# Patient Record
Sex: Female | Born: 1945 | Race: Black or African American | Hispanic: No | Smoking: Former smoker
Health system: Southern US, Community
[De-identification: ages and names within clinical notes are randomized; demographics above are authoritative.]

## PROBLEM LIST (undated history)

## (undated) DIAGNOSIS — IMO0002 Reserved for concepts with insufficient information to code with codable children: Secondary | ICD-10-CM

## (undated) DIAGNOSIS — R011 Cardiac murmur, unspecified: Secondary | ICD-10-CM

## (undated) DIAGNOSIS — D649 Anemia, unspecified: Secondary | ICD-10-CM

## (undated) DIAGNOSIS — C50919 Malignant neoplasm of unspecified site of unspecified female breast: Secondary | ICD-10-CM

## (undated) DIAGNOSIS — IMO0001 Reserved for inherently not codable concepts without codable children: Secondary | ICD-10-CM

## (undated) DIAGNOSIS — M1990 Unspecified osteoarthritis, unspecified site: Secondary | ICD-10-CM

## (undated) DIAGNOSIS — K219 Gastro-esophageal reflux disease without esophagitis: Secondary | ICD-10-CM

## (undated) DIAGNOSIS — Z9221 Personal history of antineoplastic chemotherapy: Secondary | ICD-10-CM

## (undated) DIAGNOSIS — Z923 Personal history of irradiation: Secondary | ICD-10-CM

## (undated) DIAGNOSIS — T7840XA Allergy, unspecified, initial encounter: Secondary | ICD-10-CM

## (undated) DIAGNOSIS — M26609 Unspecified temporomandibular joint disorder, unspecified side: Secondary | ICD-10-CM

## (undated) DIAGNOSIS — G43909 Migraine, unspecified, not intractable, without status migrainosus: Secondary | ICD-10-CM

## (undated) DIAGNOSIS — Z9289 Personal history of other medical treatment: Secondary | ICD-10-CM

## (undated) DIAGNOSIS — I10 Essential (primary) hypertension: Secondary | ICD-10-CM

## (undated) HISTORY — DX: Malignant neoplasm of unspecified site of unspecified female breast: C50.919

## (undated) HISTORY — PX: BREAST BIOPSY: SHX20

## (undated) HISTORY — DX: Anemia, unspecified: D64.9

## (undated) HISTORY — PX: TONSILLECTOMY: SUR1361

## (undated) HISTORY — DX: Allergy, unspecified, initial encounter: T78.40XA

## (undated) HISTORY — DX: Essential (primary) hypertension: I10

## (undated) HISTORY — DX: Unspecified osteoarthritis, unspecified site: M19.90

## (undated) HISTORY — DX: Cardiac murmur, unspecified: R01.1

## (undated) HISTORY — DX: Reserved for concepts with insufficient information to code with codable children: IMO0002

## (undated) HISTORY — DX: Migraine, unspecified, not intractable, without status migrainosus: G43.909

## (undated) HISTORY — DX: Reserved for inherently not codable concepts without codable children: IMO0001

## (undated) HISTORY — PX: TOTAL ABDOMINAL HYSTERECTOMY: SHX209

---

## 1999-04-04 ENCOUNTER — Other Ambulatory Visit: Admission: RE | Admit: 1999-04-04 | Discharge: 1999-04-04 | Payer: Self-pay | Admitting: Obstetrics and Gynecology

## 2000-11-21 ENCOUNTER — Encounter: Payer: Self-pay | Admitting: Emergency Medicine

## 2000-11-21 ENCOUNTER — Observation Stay (HOSPITAL_COMMUNITY): Admission: EM | Admit: 2000-11-21 | Discharge: 2000-11-22 | Payer: Self-pay | Admitting: Emergency Medicine

## 2003-06-29 ENCOUNTER — Encounter: Admission: RE | Admit: 2003-06-29 | Discharge: 2003-06-29 | Payer: Self-pay | Admitting: Obstetrics and Gynecology

## 2003-06-29 ENCOUNTER — Encounter: Payer: Self-pay | Admitting: Obstetrics and Gynecology

## 2008-03-21 ENCOUNTER — Observation Stay (HOSPITAL_COMMUNITY): Admission: EM | Admit: 2008-03-21 | Discharge: 2008-03-23 | Payer: Self-pay | Admitting: Emergency Medicine

## 2008-04-09 ENCOUNTER — Ambulatory Visit: Payer: Self-pay | Admitting: Infectious Diseases

## 2008-04-09 ENCOUNTER — Encounter (INDEPENDENT_AMBULATORY_CARE_PROVIDER_SITE_OTHER): Payer: Self-pay | Admitting: Internal Medicine

## 2008-04-09 DIAGNOSIS — I10 Essential (primary) hypertension: Secondary | ICD-10-CM

## 2008-04-09 LAB — CONVERTED CEMR LAB
BUN: 13 mg/dL (ref 6–23)
CO2: 23 meq/L (ref 19–32)
Calcium: 9.2 mg/dL (ref 8.4–10.5)
Chloride: 100 meq/L (ref 96–112)
Creatinine, Ser: 0.77 mg/dL (ref 0.40–1.20)
Creatinine, Urine: 114.2 mg/dL
Glucose, Bld: 104 mg/dL — ABNORMAL HIGH (ref 70–99)
Microalb Creat Ratio: 13.5 mg/g (ref 0.0–30.0)
Microalb, Ur: 1.54 mg/dL (ref 0.00–1.89)
Potassium: 3.6 meq/L (ref 3.5–5.3)
Sodium: 140 meq/L (ref 135–145)

## 2008-07-03 ENCOUNTER — Encounter: Payer: Self-pay | Admitting: Infectious Disease

## 2008-08-27 ENCOUNTER — Telehealth (INDEPENDENT_AMBULATORY_CARE_PROVIDER_SITE_OTHER): Payer: Self-pay | Admitting: Internal Medicine

## 2008-10-23 ENCOUNTER — Encounter (INDEPENDENT_AMBULATORY_CARE_PROVIDER_SITE_OTHER): Payer: Self-pay | Admitting: Internal Medicine

## 2008-10-23 ENCOUNTER — Ambulatory Visit: Payer: Self-pay | Admitting: Internal Medicine

## 2008-11-12 ENCOUNTER — Ambulatory Visit (HOSPITAL_COMMUNITY): Admission: RE | Admit: 2008-11-12 | Discharge: 2008-11-12 | Payer: Self-pay | Admitting: Internal Medicine

## 2008-12-01 ENCOUNTER — Telehealth (INDEPENDENT_AMBULATORY_CARE_PROVIDER_SITE_OTHER): Payer: Self-pay | Admitting: Internal Medicine

## 2009-01-25 ENCOUNTER — Telehealth (INDEPENDENT_AMBULATORY_CARE_PROVIDER_SITE_OTHER): Payer: Self-pay | Admitting: Internal Medicine

## 2009-02-10 ENCOUNTER — Ambulatory Visit: Payer: Self-pay | Admitting: *Deleted

## 2009-02-10 ENCOUNTER — Encounter (INDEPENDENT_AMBULATORY_CARE_PROVIDER_SITE_OTHER): Payer: Self-pay | Admitting: Internal Medicine

## 2009-02-19 DIAGNOSIS — E876 Hypokalemia: Secondary | ICD-10-CM | POA: Insufficient documentation

## 2009-03-15 ENCOUNTER — Telehealth: Payer: Self-pay | Admitting: *Deleted

## 2009-03-17 ENCOUNTER — Encounter (INDEPENDENT_AMBULATORY_CARE_PROVIDER_SITE_OTHER): Payer: Self-pay | Admitting: Internal Medicine

## 2009-03-17 ENCOUNTER — Ambulatory Visit: Payer: Self-pay | Admitting: Internal Medicine

## 2009-03-22 LAB — CONVERTED CEMR LAB
BUN: 11 mg/dL (ref 6–23)
CO2: 24 meq/L (ref 19–32)
Calcium: 8.5 mg/dL (ref 8.4–10.5)
Chloride: 105 meq/L (ref 96–112)
Creatinine, Ser: 0.85 mg/dL (ref 0.40–1.20)
GFR calc Af Amer: >60 mL/min (ref >60)
GFR calc non Af Amer: >60 mL/min (ref >60)
Glucose, Bld: 93 mg/dL (ref 70–99)
Potassium: 3.4 meq/L — ABNORMAL LOW (ref 3.5–5.3)
Sodium: 141 meq/L (ref 135–145)

## 2009-03-23 LAB — CONVERTED CEMR LAB
BUN: 13 mg/dL (ref 6–23)
CO2: 29 meq/L (ref 19–32)
Calcium: 9.3 mg/dL (ref 8.4–10.5)
Chloride: 100 meq/L (ref 96–112)
Creatinine, Ser: 0.84 mg/dL (ref 0.40–1.20)
GFR calc Af Amer: >60 mL/min (ref >60)
GFR calc non Af Amer: >60 mL/min (ref >60)
Glucose, Bld: 95 mg/dL (ref 70–99)
Potassium: 3.3 meq/L — ABNORMAL LOW (ref 3.5–5.3)
Sodium: 140 meq/L (ref 135–145)

## 2009-04-08 ENCOUNTER — Ambulatory Visit: Payer: Self-pay | Admitting: Internal Medicine

## 2009-04-08 ENCOUNTER — Encounter (INDEPENDENT_AMBULATORY_CARE_PROVIDER_SITE_OTHER): Payer: Self-pay | Admitting: Internal Medicine

## 2009-04-08 LAB — CONVERTED CEMR LAB
Potassium: 3.8 meq/L (ref 3.5–5.3)
Sodium: 140 meq/L (ref 135–145)

## 2009-09-27 ENCOUNTER — Telehealth: Payer: Self-pay | Admitting: *Deleted

## 2009-10-20 ENCOUNTER — Encounter (INDEPENDENT_AMBULATORY_CARE_PROVIDER_SITE_OTHER): Payer: Self-pay | Admitting: *Deleted

## 2009-10-20 ENCOUNTER — Ambulatory Visit: Payer: Self-pay | Admitting: Internal Medicine

## 2009-10-20 LAB — CONVERTED CEMR LAB
Albumin: 4.2 g/dL (ref 3.5–5.2)
CO2: 24 meq/L (ref 19–32)
Chloride: 102 meq/L (ref 96–112)
Glucose, Bld: 93 mg/dL (ref 70–99)
Potassium: 3.6 meq/L (ref 3.5–5.3)
Sodium: 136 meq/L (ref 135–145)
Total Protein: 7.8 g/dL (ref 6.0–8.3)

## 2009-10-22 ENCOUNTER — Telehealth (INDEPENDENT_AMBULATORY_CARE_PROVIDER_SITE_OTHER): Payer: Self-pay | Admitting: Internal Medicine

## 2010-06-28 ENCOUNTER — Ambulatory Visit: Payer: Self-pay | Admitting: Internal Medicine

## 2010-06-28 DIAGNOSIS — R21 Rash and other nonspecific skin eruption: Secondary | ICD-10-CM

## 2010-06-29 ENCOUNTER — Encounter: Payer: Self-pay | Admitting: Internal Medicine

## 2010-06-29 ENCOUNTER — Ambulatory Visit: Payer: Self-pay | Admitting: Internal Medicine

## 2010-06-30 LAB — CONVERTED CEMR LAB
AST: 17 units/L (ref 0–37)
Albumin: 4 g/dL (ref 3.5–5.2)
Alkaline Phosphatase: 74 units/L (ref 39–117)
BUN: 19 mg/dL (ref 6–23)
Cholesterol: 205 mg/dL — ABNORMAL HIGH (ref 0–200)
HCT: 29.6 % — ABNORMAL LOW (ref 36.0–46.0)
HDL: 54 mg/dL (ref >39)
Platelets: 320 10*3/uL (ref 150–400)
Potassium: 3.8 meq/L (ref 3.5–5.3)
Sodium: 140 meq/L (ref 135–145)
TSH: 3.566 microintl units/mL (ref 0.350–4.5)
Total CHOL/HDL Ratio: 3.8
Total Protein: 7 g/dL (ref 6.0–8.3)
VLDL: 38 mg/dL (ref 0–40)

## 2010-10-25 NOTE — Letter (Signed)
Summary: Previsit letter  [facility]  [address] [place], [place] [address]   Phone: [phone]  Fax: [phone]       10/20/2009 MRN: [id number]  [facility] [address] [place], [place]  [address]  Dear Ms. Bursch,  Welcome to the Gastroenterology Division at [facility].    You are scheduled to see a nurse for your pre-procedure visit on 11/29/2009 at 9:00AM on the 3rd floor at [facility], [address]  We ask that you try to arrive at our office 15 minutes prior to your appointment time to allow for check-in.  Your nurse visit will consist of discussing your medical and surgical history, your immediate family medical history, and your medications.    Please bring a complete list of all your medications or, if you prefer, bring the medication bottles and we will list them.  We will need to be aware of both prescribed and over the counter drugs.  We will need to know exact dosage information as well.  If you are on blood thinners (Coumadin, Plavix, Aggrenox, Ticlid, etc.) please call our office today/prior to your appointment, as we need to consult with your physician about holding your medication.   Please be prepared to read and sign documents such as consent forms, a financial agreement, and acknowledgement forms.  If necessary, and with your consent, a friend or relative is welcome to sit-in on the nurse visit with you.  Please bring your insurance card so that we may make a copy of it.  If your insurance requires a referral to see a specialist, please bring your referral form from your primary care physician.  No co-pay is required for this nurse visit.     If you cannot keep your appointment, please call [phone] to cancel or reschedule prior to your appointment date.  This allows [place] the opportunity to schedule an appointment for another patient in need of care.    Thank you for choosing [facility] for your medical needs.  We  appreciate the opportunity to care for you.  Please visit [place] at our website  to learn more about our practice.                     Sincerely.                                                                                                                   The Gastroenterology Division

## 2010-10-25 NOTE — Progress Notes (Signed)
Summary: phone/gg  Phone Note Call from Patient   Summary of Call: Pt called and stated she received a flu shot yesterday and wants it added to her record. Initial call taken by: Merrie Roof RN,  October 22, 2009 2:57 PM  Follow-up for Phone Call        I do not know how to do this.  When I looked, though, it looked like it was already done.  Thanks. Follow-up by: Joaquin Courts  MD,  October 25, 2009 11:58 AM

## 2010-10-25 NOTE — Assessment & Plan Note (Signed)
Summary: per dr Andrey Campanile, checkup for med refills/pcp-wilson/hla   Vital Signs:  Patient profile:   65 year old female Height:      63 inches (160.02 cm) Weight:      145.6 pounds (66.18 kg) BMI:     25.89 Temp:     96.8 degrees F (36 degrees C) oral Pulse rate:   68 / minute BP sitting:   140 / 85  (right arm)  Vitals Entered By: Stanton Kidney Ditzler RN (October 20, 2009 2:06 PM) Is Patient Diabetic? No Pain Assessment Patient in pain? no      Nutritional Status BMI of 25 - 29 = overweight Nutritional Status Detail appetite good  Have you ever been in a relationship where you felt threatened, hurt or afraid?denies   Does patient need assistance? Functional Status Self care Ambulation Normal Comments Ck-up and refills on meds.   Primary Care Provider:  Joaquin Courts  MD   History of Present Illness: 65 yo woman who presents for fu because she needs a refill of her meds. She is having no health complaints.  Was noted to be slightly hypokalemic in the past for which she was given a short course of K+ supplementation, but she is done with this.   See ROS and physical exam, but absolutely no complaints today.   Depression History:      The patient denies a depressed mood most of the day and a diminished interest in her usual daily activities.         Preventive Screening-Counseling & Management  Alcohol-Tobacco     Smoking Status: never  Problems Prior to Update: 1)  Hypokalemia  (ICD-276.8) 2)  Essential Hypertension, Benign  (ICD-401.1) 3)  Screening For Unspecified Malignant Neoplasm  (ICD-V76.9)  Current Medications (verified): 1)  Maxzide-25 37.5-25 Mg Tabs (Triamterene-Hctz) .... Take 1 Tablet By Mouth Once A Day 2)  Premarin 0.45 Mg  Tabs (Estrogens Conjugated) 3)  Calcium 600/vitamin D 600-400 Mg-Unit Tabs (Calcium Carbonate-Vitamin D) .... Take 1 Tablet By Mouth Two Times A Day  Allergies: 1)  ! * Pencillin  Review of Systems  The patient denies anorexia,  fever, weight loss, weight gain, vision loss, decreased hearing, hoarseness, chest pain, syncope, dyspnea on exertion, peripheral edema, prolonged cough, headaches, hemoptysis, abdominal pain, melena, hematochezia, severe indigestion/heartburn, hematuria, incontinence, genital sores, muscle weakness, suspicious skin lesions, transient blindness, difficulty walking, depression, unusual weight change, abnormal bleeding, enlarged lymph nodes, angioedema, and breast masses.    Physical Exam  General:  alert, well-developed, well-nourished, and well-hydrated.   Head:  normocephalic and atraumatic.   Eyes:  vision grossly intact, pupils equal, pupils round, and pupils reactive to light.   Ears:  no external deformities.   Nose:  no external deformity.   Mouth:  no gingival abnormalities, pharynx pink and moist, no erythema, and no exudates.   Lungs:  normal respiratory effort, no accessory muscle use, normal breath sounds, no crackles, and no wheezes.   Heart:  normal rate, regular rhythm, no murmur, no gallop, and no rub.   Abdomen:  soft, non-tender, and normal bowel sounds.   Neurologic:  alert & oriented X3, cranial nerves II-XII intact, and strength normal in all extremities.   Psych:  Oriented X3, memory intact for recent and remote, normally interactive, good eye contact, not anxious appearing, and not depressed appearing.   Additional Exam:  manual BP in room 125/75.    Impression & Recommendations:  Problem # 1:  ESSENTIAL HYPERTENSION, BENIGN (ICD-401.1) Refiled  meds. Manual recheck in room was 125/75, which is at goal. Checking metabolic panel today.  Her updated medication list for this problem includes:    Maxzide-25 37.5-25 Mg Tabs (Triamterene-hctz) .Marland Kitchen... Take 1 tablet by mouth once a day  Orders: T-Comprehensive Metabolic Panel ([id number])  Problem # 2:  HYPOKALEMIA (ICD-276.8) Checking K+ today.  **K+ on the lower side of normal. Should be checked again at her next visit.    Orders: T-Comprehensive Metabolic Panel [id number])  Problem # 3:  Preventive Health Care (ICD-V70.0) Says tetanus 7 years ago. Agreed to flu but then recanted as she was unsure if her insurance would cover it. She will check and will get it at a local walk-in clinic near her house if it is covered. Up to date on mammo. Says she had normal PAP last month. Agreed to let me refer for mammo today.  Complete Medication List: 1)  Maxzide-25 37.5-25 Mg Tabs (Triamterene-hctz) .... Take 1 tablet by mouth once a day 2)  Premarin 0.45 Mg Tabs (Estrogens conjugated) 3)  Calcium 600/vitamin D 600-400 Mg-unit Tabs (Calcium carbonate-vitamin d) .... Take 1 tablet by mouth two times a day  Other Orders: Gastroenterology Referral (GI)  Patient Instructions: 1)  Please schedule a followup appointment in 6 months.  Prescriptions: MAXZIDE-25 37.5-25 MG TABS (TRIAMTERENE-HCTZ) Take 1 tablet by mouth once a day  #30 x 6   Entered and Authorized by:   Aris Lot MD   Signed by:   Aris Lot MD on 10/20/2009   Method used:   Print then Give to Patient   RxID:   [id number]  Process Orders Check Orders Results:     [facility]: ABN not required for this insurance Tests Sent for requisitioning (October 21, 2009 6:29 AM):     10/20/2009: [facility] -- T-Comprehensive Metabolic Panel [phone] (signed)    Prevention & Chronic Care Immunizations   Influenza vaccine: Fluvax Non-MCR  (10/23/2008)    Tetanus booster: Not documented    Pneumococcal vaccine: Not documented    H. zoster vaccine: Not documented  Colorectal Screening   Hemoccult: Not documented    Colonoscopy: Not documented   Colonoscopy action/deferral: GI referral  (10/20/2009)  Other Screening   Pap smear: Not documented    Mammogram: ASSESSMENT: Negative - BI-RADS 1^MM DIGITAL SCREENING  (11/12/2008)    DXA bone density scan: Not documented   Smoking status: never   (10/20/2009)  Lipids   Total Cholesterol: Not documented   LDL: Not documented   LDL Direct: Not documented   HDL: Not documented   Triglycerides: Not documented  Hypertension   Last Blood Pressure: 140 / 85  (10/20/2009)   Serum creatinine: 0.99  (04/08/2009)   Serum potassium 3.8  (04/08/2009) CMP ordered   Self-Management Support :    Patient will work on the following items until the next clinic visit to reach self-care goals:     Medications and monitoring: take my medicines every day, bring all of my medications to every visit, weigh myself weekly  (10/20/2009)     Eating: drink diet soda or water instead of juice or soda, eat more vegetables, use fresh or frozen vegetables, eat foods that are low in salt, eat baked foods instead of fried foods, eat fruit for snacks and desserts, limit or avoid alcohol  (10/20/2009)     Activity: take a 30 minute walk every day, take the stairs instead of the elevator  (10/20/2009)    Hypertension self-management support: Not  documented   Nursing Instructions:  GI referral for screening colonoscopy (see order)

## 2010-10-25 NOTE — Assessment & Plan Note (Signed)
Summary: ACUTE-RASH ON LEGS/MEDICATION REFILLS(HO)/CFB   Vital Signs:  Patient profile:   65 year old female Height:      63 inches (160.02 cm) Weight:      145.4 pounds (66.09 kg) BMI:     25.85 Temp:     96.5 degrees F (35.83 degrees C) oral Pulse rate:   72 / minute BP sitting:   114 / 69  (right arm)  Vitals Entered By: Stanton Kidney Ditzler RN (June 28, 2010 10:36 AM) Is Patient Diabetic? No Pain Assessment Patient in pain? yes     Location: 2nd finger right hand Intensity: 3 Type: dull Onset of pain  some time Nutritional Status BMI of 25 - 29 = overweight Nutritional Status Detail appetite good  Have you ever been in a relationship where you felt threatened, hurt or afraid?denies   Does patient need assistance? Functional Status Self care Ambulation Normal Comments Refills on meds and ck rash left leg x 2 weeks - itching better. Discuss 2nd finger right hand.   Primary Care Rashi Giuliani:  Joaquin Courts  MD   History of Present Illness: 65 yr old woman with pmhx as described below comes to the clinic for follow up. Reports that about 2 weeks ago she started to get rash in her left ankle and it has spread up. Was itchy but has resolved. Has use hydrocortisone cream, and alcohol. Reports to be getting better.  Denies fever or chills.   Patient would like to have refill of medication.   Depression History:      The patient denies a depressed mood most of the day and a diminished interest in her usual daily activities.         Preventive Screening-Counseling & Management  Alcohol-Tobacco     Smoking Status: never  Problems Prior to Update: 1)  Hypokalemia  (ICD-276.8) 2)  Essential Hypertension, Benign  (ICD-401.1) 3)  Screening For Unspecified Malignant Neoplasm  (ICD-V76.9)  Medications Prior to Update: 1)  Maxzide-25 37.5-25 Mg Tabs (Triamterene-Hctz) .... Take 1 Tablet By Mouth Once A Day 2)  Premarin 0.45 Mg  Tabs (Estrogens Conjugated) 3)  Calcium 600/vitamin  D 600-400 Mg-Unit Tabs (Calcium Carbonate-Vitamin D) .... Take 1 Tablet By Mouth Two Times A Day  Current Medications (verified): 1)  Maxzide-25 37.5-25 Mg Tabs (Triamterene-Hctz) .... Take 1 Tablet By Mouth Once A Day 2)  Premarin 0.45 Mg  Tabs (Estrogens Conjugated) 3)  Calcium 600/vitamin D 600-400 Mg-Unit Tabs (Calcium Carbonate-Vitamin D) .... Take 1 Tablet By Mouth Two Times A Day  Allergies: 1)  ! * Pencillin  Past History:  Past Medical History: Last updated: 04/09/2008 Htn Seasonal allegies Hx of heart murmur Hx of anemia  Past Surgical History: Last updated: 04/09/2008 Hysterectomy  Family History: Last updated: 04/09/2008 Mother-cervical ca brother-colon ca, CHF, DM  Social History: Last updated: 04/09/2008 No smoking, etoh once in a while, no illegal drugs.  Retired, worked for [employer] in [place] in 07/08.  Currently working as a [employer] fast streamer.   Risk Factors: Smoking Status: never (06/28/2010)  Family History: Reviewed history from 04/09/2008 and no changes required. Mother-cervical ca brother-colon ca, CHF, DM  Social History: Reviewed history from 04/09/2008 and no changes required. No smoking, etoh once in a while, no illegal drugs.  Retired, worked for [employer] in [employer] in 07/08.  Currently working as a [employer] fast streamer.   Review of Systems  The patient denies fever, chest pain, dyspnea on exertion,  peripheral edema, hemoptysis, abdominal pain, melena, hematochezia, and hematuria.    Physical Exam  General:  NAD Mouth:  MMM Neck:  supple.   Lungs:  normal respiratory effort, no accessory muscle use, normal breath sounds, no crackles, and no wheezes.   Heart:  normal rate, regular rhythm, no murmur, no gallop, and no rub.   Abdomen:  soft, non-tender, and normal bowel sounds.   Msk:  normal ROM.   Extremities:  No edema Neurologic:  Nonfocal Skin:  scaly rash on anterior aspect of left  ankle   Impression & Recommendations:  Problem # 1:  SKIN RASH (ICD-782.1) Most likely Psoriasis. Will start patient on treatment and follow up.  Her updated medication list for this problem includes:    Triamcinolone Acetonide 0.1 % Crea (Triamcinolone acetonide) .Marland Kitchen... Apply thin fild to affected area 2-3 times/day  Problem # 2:  ESSENTIAL HYPERTENSION, BENIGN (ICD-401.1) Controlled. Continue current regimen. Will review labs.  Her updated medication list for this problem includes:    Maxzide-25 37.5-25 Mg Tabs (Triamterene-hctz) .Marland Kitchen... Take 1 tablet by mouth once a day  Future Orders: T-CMP with Estimated GFR ([id number]) ... 06/29/2010 T-CBC No Diff ([id number]) ... 06/29/2010 T-Lipid Profile [id number]) ... 06/29/2010 T-TSH ([id number]) ... 06/29/2010  BP today: 114/69 Prior BP: 140/85 (10/20/2009)  Labs Reviewed: K+: 3.6 (10/20/2009) Creat: : 1.14 (10/20/2009)     Problem # 3:  Preventive Health Care (ICD-V70.0) Patient has not scheduled Colonoscopy because she is scared about the results. Went over the benefits of early screening of colon cancer. Patient was receptive and said she was going to schedule appointment for Colonoscopy.  Complete Medication List: 1)  Maxzide-25 37.5-25 Mg Tabs (Triamterene-hctz) .... Take 1 tablet by mouth once a day 2)  Premarin 0.45 Mg Tabs (Estrogens conjugated) 3)  Calcium 600/vitamin D 600-400 Mg-unit Tabs (Calcium carbonate-vitamin d) .... Take 1 tablet by mouth two times a day 4)  Triamcinolone Acetonide 0.1 % Crea (Triamcinolone acetonide) .... Apply thin fild to affected area 2-3 times/day  Other Orders: Influenza Vaccine NON MCR ([id number])  Patient Instructions: 1)  Please schedule a follow-up appointment in 6 months. 2)  Schedule Colonoscopy. 3)  Return tommorrow for blood draw. 4)  You will be called with any abnormalities in the tests scheduled or performed today.  If you don't hear from [place] within a week from when the test  was performed, you can assume that your test was normal.  Prescriptions: MAXZIDE-25 37.5-25 MG TABS (TRIAMTERENE-HCTZ) Take 1 tablet by mouth once a day  #30 x 6   Entered and Authorized by:   Laren Everts MD   Signed by:   Laren Everts MD on 06/28/2010   Method used:   [facility] to        [facility]  [address] [phone]* (retail)       [address]       [place], [place]  [address]       Ph: [phone] or [phone]       Fax: [phone]   RxID:   [id number] TRIAMCINOLONE ACETONIDE 0.1 % CREA (TRIAMCINOLONE ACETONIDE) Apply thin fild to affected area 2-3 times/day  #15g x 2   Entered and Authorized by:   Laren Everts MD   Signed by:   Laren Everts MD on 06/28/2010   Method used:   [facility]        [facility]  [address] [phone]* (retail)  [address]       [place], [place]  [address]       Ph: [phone] or [phone]       Fax: [phone]   RxID:   [id number]  Process Orders Tests Sent for requisitioning (June 28, 2010 1:17 PM):     06/29/2010: [facility] -- T-CMP with Estimated GFR [[id number]] (signed)     06/29/2010: [facility] -- T-CBC No Diff [[id number]] (signed)     06/29/2010: [facility] -- T-Lipid Profile [id number] (signed)     06/29/2010: [facility] -- T-TSH [phone] (signed)    Prevention & Chronic Care Immunizations   Influenza vaccine: Fluvax Non-MCR  (06/28/2010)    Tetanus booster: Not documented    Pneumococcal vaccine: Not documented    H. zoster vaccine: Not documented  Colorectal Screening   Hemoccult: Not documented    Colonoscopy: Not documented   Colonoscopy action/deferral: GI referral  (10/20/2009)  Other Screening   Pap smear: Not documented    Mammogram: ASSESSMENT: Negative - BI-RADS 1^MM DIGITAL SCREENING  (11/12/2008)    DXA  bone density scan: Not documented   Smoking status: never  (06/28/2010)  Lipids   Total Cholesterol: Not documented   LDL: Not documented   LDL Direct: Not documented   HDL: Not documented   Triglycerides: Not documented  Hypertension   Last Blood Pressure: 114 / 69  (06/28/2010)   Serum creatinine: 1.14  (10/20/2009)   Serum potassium 3.6  (10/20/2009)    Hypertension flowsheet reviewed?: Yes   Progress toward BP goal: At goal  Self-Management Support :   Personal Goals (by the next clinic visit) :      Personal blood pressure goal: 140/90  (06/28/2010)   Patient will work on the following items until the next clinic visit to reach self-care goals:     Medications and monitoring: take my medicines every day, check my blood pressure, bring all of my medications to every visit, examine my feet every day  (06/28/2010)     Eating: drink diet soda or water instead of juice or soda, eat more vegetables, use fresh or frozen vegetables, eat foods that are low in salt, eat fruit for snacks and desserts, limit or avoid alcohol  (06/28/2010)     Activity: take a 30 minute walk every day  (06/28/2010)    Hypertension self-management support: Written self-care plan, Education handout, Resources for patients handout  (06/28/2010)   Hypertension self-care plan printed.   Hypertension education handout printed      Resource handout printed.    Influenza Vaccine    Vaccine Type: Fluvax Non-MCR    Site: left deltoid    Mfr: GlaxoSmithKline    Dose: 0.5 ml    Route: IM    Given by: Stanton Kidney Ditzler RN    Exp. Date: 03/25/2011    Lot #: [id number]    VIS given: 04/19/10 version given June 28, 2010.  Flu Vaccine Consent Questions    Do you have a history of severe allergic reactions to this vaccine? no    Any prior history of allergic reactions to egg and/or gelatin? no    Do you have a sensitivity to the preservative Thimersol? no    Do you have a past history of Guillan-Barre  Syndrome? no    Do you currently have an acute febrile illness? no    Have you ever had a severe reaction  to latex? no    Vaccine information given and explained to patient? yes    Are you currently pregnant? no  Process Orders Tests Sent for requisitioning (June 28, 2010 1:17 PM):     06/29/2010: [facility] -- T-CMP with Estimated GFR [[id number]] (signed)     06/29/2010: [facility] -- T-CBC No Diff [[id number]] (signed)     06/29/2010: [facility] -- T-Lipid Profile [phone] (signed)     06/29/2010: [facility] -- T-TSH [phone] (signed)

## 2010-10-25 NOTE — Progress Notes (Signed)
Summary: Refill/gh  Phone Note Refill Request Message from:  Fax from Pharmacy on September 27, 2009 4:51 PM  Refills Requested: Medication #1:  MAXZIDE-25 37.5-25 MG TABS Take 1 tablet by mouth once a day   Last Refilled: 08/25/2009  Method Requested: Electronic Initial call taken by: Angelina Ok RN,  September 27, 2009 4:52 PM  Follow-up for Phone Call        I will refill for one month but please call her and let her know to make an appt. Follow-up by: Joaquin Courts  MD,  September 28, 2009 4:30 PM  Additional Follow-up for Phone Call Additional follow up Details #1::        appt 1/26 [place] Additional Follow-up by: Marin Roberts RN,  September 28, 2009 4:40 PM    Prescriptions: MAXZIDE-25 37.5-25 MG TABS (TRIAMTERENE-HCTZ) Take 1 tablet by mouth once a day  #30 x 0   Entered and Authorized by:   Joaquin Courts  MD   Signed by:   Joaquin Courts  MD on 09/28/2009   Method used:   [facility]        [facility] [address] [phone]* (retail)       [address]       [place], [place]  [id number]       Ph: [phone] or [phone]       Fax: [phone]   RxID:   [id number]   Appended Document: Refill/gh  [facility] to schdule pt with an appointment.  Angelina Ok, RN September 29, 2009 11:15 AM

## 2010-12-16 ENCOUNTER — Emergency Department (HOSPITAL_COMMUNITY)
Admission: EM | Admit: 2010-12-16 | Discharge: 2010-12-17 | Disposition: A | Discharge status: Home / Self Care | Payer: BC Managed Care – PPO | Attending: Emergency Medicine | Admitting: Emergency Medicine

## 2010-12-16 DIAGNOSIS — L259 Unspecified contact dermatitis, unspecified cause: Secondary | ICD-10-CM | POA: Insufficient documentation

## 2010-12-16 DIAGNOSIS — R21 Rash and other nonspecific skin eruption: Secondary | ICD-10-CM | POA: Insufficient documentation

## 2010-12-17 ENCOUNTER — Encounter: Payer: Self-pay | Admitting: Internal Medicine

## 2010-12-17 DIAGNOSIS — J302 Other seasonal allergic rhinitis: Secondary | ICD-10-CM | POA: Insufficient documentation

## 2010-12-17 DIAGNOSIS — R011 Cardiac murmur, unspecified: Secondary | ICD-10-CM | POA: Insufficient documentation

## 2010-12-17 DIAGNOSIS — D649 Anemia, unspecified: Secondary | ICD-10-CM | POA: Insufficient documentation

## 2011-01-03 ENCOUNTER — Other Ambulatory Visit: Payer: Self-pay | Admitting: Internal Medicine

## 2011-01-03 ENCOUNTER — Other Ambulatory Visit: Payer: Self-pay | Admitting: *Deleted

## 2011-01-03 NOTE — Telephone Encounter (Signed)
Please clarify script needed, thx

## 2011-01-04 MED ORDER — TRIAMTERENE-HCTZ 37.5-25 MG PO TABS: 1.0000 | ORAL_TABLET | Freq: Every day | ORAL | Status: DC

## 2011-01-31 ENCOUNTER — Other Ambulatory Visit: Payer: Self-pay | Admitting: Internal Medicine

## 2011-02-07 NOTE — Discharge Summary (Signed)
NAMEADELYNNE, JOERGER                 ACCOUNT NO.:  [id number]   MEDICAL RECORD NO.:  [id number]          PATIENT TYPE:  INP   LOCATION:  5120                         FACILITY:  [facility]   PHYSICIAN:  Mick Sell, MD DATE OF BIRTH:  Jul 13, 1946   DATE OF ADMISSION:  03/21/2008  DATE OF DISCHARGE:  03/23/2008                               DISCHARGE SUMMARY   DISCHARGE DIAGNOSES:  1. Contact dermatitis.  2. Hives  3. Hypertension.  4. Hypokalemia.  5. Hyperglycemia secondary to steroids.  6. Mild normocytic anemia.  7. PENICILLIN allergy.   DISCHARGE MEDICATIONS:  1. HCTZ 25 mg p.o. daily  2. Prednisone taper pack 60 mg tapering by 10 mg each day for 6 days.  3. Benadryl 25 mg p.o. q.6 h p.r.n.   DISPOSITION AND FOLLOW UP:  The patient has to follow up with Dr.  Joaquin Courts in the [facility] on April 09, 2008, at 2 p.m.Marland Kitchen  At that time, a BMET  needs to be drawn to evaluate  for hypokalemia.  The patient's blood pressure also needs to be checked  and the patient may benefit from the addition of another  antihypertensive agent.  She also may benefit from a urine/microalbumin  creatinine ratio to help assist in decision making regarding  antihypertensive medication.   PROCEDURES PERFORMED:  No procedures were performed.   CONSULTATIONS:  No consultations were obtained.   ADMISSION HISTORY AND PHYSICAL:  Patient is a 65 year old female with no  primary care physician presenting to the emergency room with a 3-day  history of his swollen left eye that started after she was working in  her garden.  She notes significant itching around the eye.  She  presented to the emergency room and was treated with Augmentin for  periorbital cellulitis.  After taking the Augmentin, she developed hives  on the inferior aspect of her bilateral face along with posterior neck.  She presented back to [facility] emergency room with worsening of her  left eyelid swelling  and erythema along with hives.  She denied any  shortness of breath, tongue swelling, or any other symptoms.   PHYSICAL EXAMINATION:  ADMISSION VITALS:  Temperature 97.5, blood  pressure 181/94, heart rate 75, respiratory rate 18, O2 sat 99% on room  air.  GENERAL:  On admission physical exam generally, she is alert, pleasant,  in no acute distress.  EYE EXAM:  Sclera were clear.  The left eye lid at top and bottom had  significant erythema and edema so much so that the eye was completely  closed.  Pupils are equally round and reactive to light, and extraocular  motions were intact.  The patient also had high on the inferior aspect  of her face in perioral area and posterior neck.  CARDIOVASCULAR EXAM:  Regular rate and rhythm with 2/6 systolic ejection  murmur, best heard at the right sternal border.  LUNGS:  Clear to auscultation bilaterally with no wheezes and good air  movement.   ADMISSION LABS:  Sodium 137, potassium 3.5, chloride 104, bicarb 23, BUN  8, creatinine 0.72, glucose 109, calcium 8.9.  CBC, white blood cell  count 7.3, hemoglobin 11.8, MCV 95.1, platelet count 300, absolute  eosinophil count 0.7 with a 10% eosinophilia on the differential.  TSH  3.436, hemoglobin A1c 5.5, free T4 0.95.  Urinalysis, moderate blood  with many squamous epithelial cella, 3-6 RBCs and rare bacteria.  Lipid  profile, total cholesterol 193 with triglycerides 61, HDL 68, and LDL  113.   DIAGNOSTIC IMAGING:  1. A CT scan of the orbit showed one left periorbital and facial      cellulitis.  2. No postseptal or intraorbital extension.  3. Minimal scattered mucosal thickening and paranasal sinuses with      small amount of fluid or mucous in right maxillary sinus.   HOSPITAL COURSE:  1. Contact dermatitis.  It was felt like her erythema and edema around      the patient's left eye was likely a contact dermatitis.  Given her      history of significant digging around in the weeds the day  prior to      this event and her eosinophilia on her labs, the patient was      treated with 125 mg of Solu-Medrol in the emergency room along with      a prednisone taper and markedly improved at the time of discharge.      It was initially unclear and perhaps she did in fact have a      periorbital cellulitis and was treated with Augmentin and has a      penicillin allergy, which created the hives, so antibiotics were      held and the patient markedly improved with steroids once this was      felt like this was likely an allergic reaction.  The patient      remained afebrile and her white count was within normal limits at      the time of admission but increased most likely because of the      steroids.  2. Hives.  It was felt like the hives were secondary to Augmentin and      the patient's physical exam markedly improved and at the time of      discharge, the hives were completely gone and the patient only had      minimal edema around her left eye and could open it without      difficulty.  3. Hypertension.  The patient's blood pressure was noted to be      elevated with systolics in the 180s on admission.  Since she was      not experiencing any pain and her blood pressure was so high, BMET      was done within normal potassium and creatinine and urinalysis was      done and was not indicative of any protein in her urine.  EKG was      also done indicative of normal sinus rhythm without LVH.  A      hemoglobin A1c was checked and a lipid panel and all are within      normal limits.  The patient was started on HCTZ and after 2 days      her potassium dropped to 3.3 and this was repleted orally.  The      patient will have this followed up in the outpatient setting and      likely need another agent added because her blood pressure was so  elevated.  At the time of discharge her blood pressures ranged with      systolics in the 130s-150s.  4. Hypokalemia.  The patient's  potassium dropped slightly after HCTZ      was initiated and was repleted orally and this will be followed up      in the outpatient setting.  5. Hyperglycemia.  The patient's blood sugar was normal on admission      and hemoglobin A1c was checked and 5.5.  The patient did have a      couple of glucoses noted to be around 200 and was felt like this      was likely secondary to steroid administration.  6. Mild normocytic anemia.  An anemia panel was checked and B12 folate      and ferritin were all normal with her ferritin actually being a      little elevated 406.  Her iron and TIBC and percent saturation were      also within normal limits.   DISCHARGE VITALS:  Temperature 97.9, blood pressure 155/86, heart rate  69, respiratory rate 20, O2 sat 98% on room air.   DISCHARGE LABS:  Sodium 138, potassium 3.3, chloride 98, bicarb 28, BUN  20, creatinine 0.99, glucose 137, calcium 9.8, white blood cell count  18.5, hemoglobin 11.5, MCV 95.4, and platelet count 312.      Joaquin Courts, MD  Electronically Signed      Mick Sell, MD  Electronically Signed    VW/MEDQ  D:  03/23/2008  T:  03/24/2008  Job:  [id number]

## 2011-02-14 ENCOUNTER — Encounter: Payer: BC Managed Care – PPO | Admitting: Internal Medicine

## 2011-02-14 ENCOUNTER — Other Ambulatory Visit: Payer: Self-pay | Admitting: Internal Medicine

## 2011-02-21 ENCOUNTER — Encounter: Payer: BC Managed Care – PPO | Admitting: Internal Medicine

## 2011-03-07 ENCOUNTER — Encounter: Payer: Self-pay | Admitting: Internal Medicine

## 2011-03-07 ENCOUNTER — Ambulatory Visit (INDEPENDENT_AMBULATORY_CARE_PROVIDER_SITE_OTHER): Payer: BC Managed Care – PPO | Admitting: Internal Medicine

## 2011-03-07 VITALS — BP 109/69 | HR 78 | Temp 97.3°F | Ht 63.0 in | Wt 146.0 lb

## 2011-03-07 DIAGNOSIS — D649 Anemia, unspecified: Secondary | ICD-10-CM

## 2011-03-07 DIAGNOSIS — E781 Pure hyperglyceridemia: Secondary | ICD-10-CM | POA: Insufficient documentation

## 2011-03-07 DIAGNOSIS — E785 Hyperlipidemia, unspecified: Secondary | ICD-10-CM | POA: Insufficient documentation

## 2011-03-07 DIAGNOSIS — Z780 Asymptomatic menopausal state: Secondary | ICD-10-CM

## 2011-03-07 DIAGNOSIS — I10 Essential (primary) hypertension: Secondary | ICD-10-CM

## 2011-03-07 LAB — CBC
HCT: 31.4 % — ABNORMAL LOW (ref 36.0–46.0)
MCV: 93.5 fL (ref 78.0–100.0)
Platelets: 327 10*3/uL (ref 150–400)
RBC: 3.36 MIL/uL — ABNORMAL LOW (ref 3.87–5.11)
RDW: 13.4 % (ref 11.5–15.5)
WBC: 7.1 10*3/uL (ref 4.0–10.5)

## 2011-03-07 LAB — IRON: Iron: 50 ug/dL (ref 42–145)

## 2011-03-07 LAB — VITAMIN B12: Vitamin B-12: 540 pg/mL (ref 211–911)

## 2011-03-07 MED ORDER — CALCIUM CARBONATE-VITAMIN D 600-400 MG-UNIT PO TABS: 1.0000 | ORAL_TABLET | Freq: Every day | ORAL | Status: DC

## 2011-03-07 MED ORDER — TRIAMTERENE-HCTZ 37.5-25 MG PO TABS: 1.0000 | ORAL_TABLET | Freq: Every day | ORAL | Status: DC

## 2011-03-07 NOTE — Assessment & Plan Note (Addendum)
Hb 9.8 (06/2010).  Unclear etiology.  This could be iron deficiency vs. Sickle cell trait.  Currently asymptomatic. -Will recheck CBC today -Check anemia panel: iron, ferritin, folate, B12 -Will replete with supplement as appropriate

## 2011-03-07 NOTE — Patient Instructions (Signed)
Will get labs today and I will call you with results -Take over the counter calcium-vit D to help with bone health -Will schedule for colonoscopy and mammogram -Need to follow up with GYN for pap smear Follow up with Dr. Anselm Jungling in 4-6 months

## 2011-03-07 NOTE — Progress Notes (Signed)
Tabitha Ramirez is a 65 yo woman with HTN, anemia presents for follow up and medication refills.  She states that she has been doing well and has no complaints at this time.  She denies any fatigue, SOB, chestpain, or any other systemic symptoms.  She states that she was told that she has sickle cell trait and that her Hb has always been low but does not know her baseline Hb.  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  Reports medication compliance.  She had mammogram about 1.5 years ago and it was negative.  Never had colonoscopy in the past.  Follow up with GYN for pap smear in which she has an upcoming appointment.  Patient reports being on Premarin for a long time and no one has told her to stop.  I advised her to follow up with GYN and discuss tapering of Premarin.  Physical examination: General: alert, well-developed, and cooperative to examination.  Head: normocephalic and atraumatic.  Eyes: vision grossly intact, pupils equal, pupils round, pupils reactive to light, no injection and anicteric.  Mouth: pharynx pink and moist, no erythema, and no exudates.  Lungs: normal respiratory effort, no accessory muscle use, normal breath sounds, no crackles, and no wheezes. Heart: normal rate, regular rhythm,2/6 systolic murmur, no gallop, and no rub.  Abdomen: soft, non-tender, normal bowel sounds, no distention, no guarding, no rebound tenderness, no hepatomegaly, and no splenomegaly.  Msk: no joint swelling, no joint warmth, and no redness over joints.  Pulses: 2+ DP/PT pulses bilaterally Extremities: No cyanosis, clubbing, edema Neurologic: alert & oriented X3, cranial nerves II-XII intact, strength normal in all extremities, sensation intact to light touch, and gait normal.  Skin: turgor normal and no rashes.  Psych: Oriented X3, memory intact for recent and remote, normally interactive, good eye contact, not anxious appearing, and not depressed appearing.  ROS: as per HPI

## 2011-03-07 NOTE — Assessment & Plan Note (Signed)
Excellent control!  BP 109/69 today.  Will continue Maxzide.  Advised on low salt diet and exercise at least 30 mins per day, 3x/wk.

## 2011-03-08 LAB — FOLATE RBC: RBC Folate: 896 ng/mL (ref >=366)

## 2011-06-22 LAB — RETICULOCYTES
RBC.: 3.76 — ABNORMAL LOW
Retic Count, Absolute: 37.6
Retic Ct Pct: 1

## 2011-06-22 LAB — URINALYSIS, ROUTINE W REFLEX MICROSCOPIC
Bilirubin Urine: NEGATIVE
Glucose, UA: NEGATIVE
Ketones, ur: NEGATIVE
Leukocytes, UA: NEGATIVE
pH: 6

## 2011-06-22 LAB — CBC
HCT: 34.4 — ABNORMAL LOW
HCT: 36
Hemoglobin: 11.8 — ABNORMAL LOW
MCHC: 34.4
MCV: 95.1
Platelets: 300
Platelets: 312
Platelets: 317
RBC: 3.61 — ABNORMAL LOW
RDW: 13.5
RDW: 13.8
WBC: 5.3
WBC: 7.3

## 2011-06-22 LAB — BASIC METABOLIC PANEL
BUN: 10
BUN: 20
BUN: 8
Calcium: 8.9
Calcium: 9.4
Creatinine, Ser: 0.72
Creatinine, Ser: 0.99
GFR calc non Af Amer: 57 — ABNORMAL LOW
GFR calc non Af Amer: >60
GFR calc non Af Amer: >60
Glucose, Bld: 109 — ABNORMAL HIGH
Glucose, Bld: 137 — ABNORMAL HIGH
Potassium: 3.5
Potassium: 3.5
Sodium: 134 — ABNORMAL LOW

## 2011-06-22 LAB — TSH: TSH: 3.436

## 2011-06-22 LAB — DIFFERENTIAL
Basophils Absolute: 0
Basophils Absolute: 0.1
Basophils Relative: 0
Basophils Relative: 1
Eosinophils Absolute: 0.7
Eosinophils Relative: 0
Eosinophils Relative: 10 — ABNORMAL HIGH
Lymphocytes Relative: 12
Lymphocytes Relative: 26
Lymphocytes Relative: 31
Lymphs Abs: 1.4
Lymphs Abs: 2.2
Monocytes Absolute: 0.3
Monocytes Relative: 4
Neutro Abs: 15.5 — ABNORMAL HIGH
Neutro Abs: 4
Neutrophils Relative %: 55
Neutrophils Relative %: 73

## 2011-06-22 LAB — LIPID PANEL
Cholesterol: 193
HDL: 68
LDL Cholesterol: 113 — ABNORMAL HIGH
Total CHOL/HDL Ratio: 2.8
Triglycerides: 61

## 2011-06-22 LAB — BASIC METABOLIC PANEL WITH GFR
CO2: 23
Chloride: 104
GFR calc Af Amer: >60
Sodium: 137

## 2011-06-22 LAB — URINE MICROSCOPIC-ADD ON

## 2011-06-22 LAB — FERRITIN: Ferritin: 406 — ABNORMAL HIGH (ref 10–291)

## 2011-06-22 LAB — HEMOGLOBIN A1C
Hgb A1c MFr Bld: 5.5
Mean Plasma Glucose: 119

## 2011-06-22 LAB — IRON AND TIBC
Iron: 97
Saturation Ratios: 29
TIBC: 333
UIBC: 236

## 2011-06-22 LAB — VITAMIN B12: Vitamin B-12: 695 (ref 211–911)

## 2011-06-22 LAB — T4, FREE: Free T4: 0.95

## 2011-06-22 LAB — FOLATE: Folate: 14.3

## 2011-11-29 ENCOUNTER — Ambulatory Visit (INDEPENDENT_AMBULATORY_CARE_PROVIDER_SITE_OTHER): Payer: BC Managed Care – PPO | Admitting: Internal Medicine

## 2011-11-29 ENCOUNTER — Encounter: Payer: Self-pay | Admitting: Internal Medicine

## 2011-11-29 VITALS — BP 103/66 | HR 75 | Temp 97.4°F | Ht 63.0 in | Wt 143.1 lb

## 2011-11-29 DIAGNOSIS — Z780 Asymptomatic menopausal state: Secondary | ICD-10-CM

## 2011-11-29 DIAGNOSIS — E781 Pure hyperglyceridemia: Secondary | ICD-10-CM

## 2011-11-29 DIAGNOSIS — N959 Unspecified menopausal and perimenopausal disorder: Secondary | ICD-10-CM

## 2011-11-29 DIAGNOSIS — D649 Anemia, unspecified: Secondary | ICD-10-CM

## 2011-11-29 DIAGNOSIS — I10 Essential (primary) hypertension: Secondary | ICD-10-CM

## 2011-11-29 DIAGNOSIS — Z23 Encounter for immunization: Secondary | ICD-10-CM | POA: Insufficient documentation

## 2011-11-29 DIAGNOSIS — E876 Hypokalemia: Secondary | ICD-10-CM

## 2011-11-29 MED ORDER — TRIAMTERENE-HCTZ 37.5-25 MG PO TABS: 1.0000 | ORAL_TABLET | Freq: Every day | ORAL | Status: DC

## 2011-11-29 NOTE — Assessment & Plan Note (Signed)
Likely anemia of chronic disease with a ferritin in 500's and iron 50's.  Her Hb has been stable in the 10-11 range.  She also reports having sickle cell traits.  Patient is  Completely asymptomatic. -Will monitor CBC

## 2011-11-29 NOTE — Progress Notes (Signed)
HPI: [name] is a 66 yo W with PMH of anemia of chronic disease, HTN, hypertriglyceride, systolic heart murmur presents today for routine follow up.  She has been doing well and does not have any specific complaints except for occasional arthritis pain.  She needs RF on her BP med today. Denies any chest pain, dyspnea or angina or syncope symptoms. 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 She had a pap smear last year and will follow up with GYN.  She states it was normal.  She discussed with her GYN about premarin and decided to continue taking this medication despite the possible side effect of cancer for long-term use.  She had colonoscopy last 04/2011 and it was normal, next one is due in 2022. She does not want Tdap today.  ROS: as per HPI  PE: General: alert, well-developed, and cooperative to examination.  Neck: supple, full ROM, no thyromegaly, no JVD Lungs: normal respiratory effort, no accessory muscle use, normal breath sounds, no crackles, and no wheezes. Heart: normal rate, regular rhythm, 2/6 systolic murmur right upper border with radiation to carotid, no gallop, and no rub.  Abdomen: soft, non-tender, normal bowel sounds, no distention, no guarding, no rebound tenderness Msk: no joint swelling, no joint warmth, and no redness over joints.  Pulses: 2+ DP/PT pulses bilaterally Extremities: No cyanosis, clubbing, edema Neurologic: alert & oriented X3, cranial nerves II-XII intact, strength normal in all extremities, sensation intact to light touch, and gait normal.  Skin: turgor normal and no rashes.  Psych: Oriented X3, memory intact for recent and remote, normally interactive, good eye contact, not anxious appearing, and not depressed appearing.

## 2011-11-29 NOTE — Assessment & Plan Note (Signed)
Administer influenza vaccine today

## 2011-11-29 NOTE — Patient Instructions (Signed)
Continue current medications Will get labs today and I will call you with any abnormal lab results Follow up with Dr. Anselm Jungling in 1 year or sooner if needed

## 2011-11-29 NOTE — Assessment & Plan Note (Signed)
Well controlled. WIll continue Maxzide 37.5/25mg  qd -Will check CMP to make sure her electrolytes are wnl

## 2011-11-29 NOTE — Assessment & Plan Note (Signed)
Patient discussed with GYN and decided to continue on Premarin despite possible side effect of cancer for long-term use.

## 2011-11-29 NOTE — Assessment & Plan Note (Signed)
LDL last year was 113 and triglyceride 180's, no other risk factors beside HTN.  Patient has been diet control -Will repeat lipid panel today

## 2011-11-30 LAB — LIPID PANEL
Cholesterol: 164 mg/dL (ref 0–200)
HDL: 43 mg/dL (ref >39)
Total CHOL/HDL Ratio: 3.8 Ratio

## 2011-11-30 LAB — COMPLETE METABOLIC PANEL WITH GFR
Albumin: 3.9 g/dL (ref 3.5–5.2)
Alkaline Phosphatase: 94 U/L (ref 39–117)
BUN: 19 mg/dL (ref 6–23)
CO2: 26 mEq/L (ref 19–32)
GFR, Est African American: 74 mL/min
GFR, Est Non African American: 64 mL/min
Glucose, Bld: 121 mg/dL — ABNORMAL HIGH (ref 70–99)
Potassium: 3.5 mEq/L (ref 3.5–5.3)
Total Bilirubin: 0.3 mg/dL (ref 0.3–1.2)

## 2011-11-30 LAB — CBC
HCT: 29.3 % — ABNORMAL LOW (ref 36.0–46.0)
MCHC: 32.4 g/dL (ref 30.0–36.0)
RDW: 13.5 % (ref 11.5–15.5)
WBC: 7.7 10*3/uL (ref 4.0–10.5)

## 2012-02-20 ENCOUNTER — Ambulatory Visit (INDEPENDENT_AMBULATORY_CARE_PROVIDER_SITE_OTHER): Payer: BC Managed Care – PPO | Admitting: Physician Assistant

## 2012-02-20 VITALS — BP 104/58 | HR 84 | Temp 98.5°F | Resp 18 | Ht 63.0 in | Wt 142.8 lb

## 2012-02-20 DIAGNOSIS — R7989 Other specified abnormal findings of blood chemistry: Secondary | ICD-10-CM

## 2012-02-20 DIAGNOSIS — L237 Allergic contact dermatitis due to plants, except food: Secondary | ICD-10-CM

## 2012-02-20 DIAGNOSIS — Z131 Encounter for screening for diabetes mellitus: Secondary | ICD-10-CM

## 2012-02-20 DIAGNOSIS — L255 Unspecified contact dermatitis due to plants, except food: Secondary | ICD-10-CM

## 2012-02-20 DIAGNOSIS — L299 Pruritus, unspecified: Secondary | ICD-10-CM

## 2012-02-20 LAB — GLUCOSE, POCT (MANUAL RESULT ENTRY): POC Glucose: 124 mg/dl — AB (ref 70–99)

## 2012-02-20 MED ORDER — PREDNISONE 10 MG PO TABS: ORAL_TABLET | ORAL | Status: DC

## 2012-02-20 MED ORDER — TRIAMCINOLONE ACETONIDE 0.1 % EX CREA: TOPICAL_CREAM | Freq: Two times a day (BID) | CUTANEOUS | Status: DC

## 2012-02-20 MED ORDER — CETIRIZINE HCL 10 MG PO TABS: 10.0000 mg | ORAL_TABLET | Freq: Every day | ORAL | Status: DC

## 2012-02-20 NOTE — Progress Notes (Signed)
  Subjective:    Patient ID: Tabitha Ramirez, female    DOB: Nov 23, 1945, 66 y.o.   MRN: [id number]  HPI 66 yr old AAF presents with pruritic rash s/p exposure to poison ivy ~4days ago.  No f/c, no recent illness. Rash on forearms and hands.  Review of Systems  All other systems reviewed and are negative.      Objective:   Physical Exam  Nursing note and vitals reviewed. Constitutional: She is oriented to person, place, and time. She appears well-developed and well-nourished.  HENT:  Head: Normocephalic and atraumatic.  Cardiovascular: Normal rate, regular rhythm and normal heart sounds.   Pulmonary/Chest: Effort normal and breath sounds normal.  Neurological: She is alert and oriented to person, place, and time.  Skin: Rash (contact distribution vesicles typical of poison ivy/poison oak. Present on forearms and hands.) noted.     Results for orders placed in visit on 02/20/12  GLUCOSE, POCT (MANUAL RESULT ENTRY)      Component Value Range   POC Glucose 124 (*) 70 - 99 (mg/dl)        Assessment & Plan:  Poison Ivy/oak Elevated non-fasting glucose.  I doubt she will need the steroids and we agreed that she would give it a few days before she took them if she does require them.  She also agrees to have her fasting glucose checked within the next couple of months.

## 2012-05-30 ENCOUNTER — Telehealth: Payer: Self-pay

## 2012-05-30 ENCOUNTER — Ambulatory Visit (INDEPENDENT_AMBULATORY_CARE_PROVIDER_SITE_OTHER): Payer: BC Managed Care – PPO | Admitting: Family Medicine

## 2012-05-30 ENCOUNTER — Ambulatory Visit: Payer: BC Managed Care – PPO

## 2012-05-30 VITALS — BP 100/50 | HR 90 | Temp 98.5°F | Resp 16 | Ht 63.0 in | Wt 139.0 lb

## 2012-05-30 DIAGNOSIS — R05 Cough: Secondary | ICD-10-CM

## 2012-05-30 DIAGNOSIS — R059 Cough, unspecified: Secondary | ICD-10-CM

## 2012-05-30 LAB — POCT CBC
Granulocyte percent: 53.6 %G (ref 37–80)
Lymph, poc: 2.9 (ref 0.6–3.4)
MID (cbc): 0.7 (ref 0–0.9)
MPV: 8.5 fL (ref 0–99.8)
POC Granulocyte: 4.1 (ref 2–6.9)
POC LYMPH PERCENT: 37.8 %L (ref 10–50)
Platelet Count, POC: 297 10*3/uL (ref 142–424)
RDW, POC: 14.9 %
WBC: 7.6 10*3/uL (ref 4.6–10.2)

## 2012-05-30 MED ORDER — AZITHROMYCIN 250 MG PO TABS: ORAL_TABLET | ORAL | Status: AC

## 2012-05-30 MED ORDER — HYDROCOD POLST-CHLORPHEN POLST 10-8 MG/5ML PO LQCR: 5.0000 mL | Freq: Two times a day (BID) | ORAL | Status: DC | PRN

## 2012-05-30 NOTE — Patient Instructions (Signed)
Get plenty of rest and drink at least 64 ounces of water each day.  If your cough persists, return for additional evaluation.

## 2012-05-30 NOTE — Telephone Encounter (Signed)
Pharm called. Tussionex is back ordered. Per Chelle, OK to change to [facility]

## 2012-05-30 NOTE — Progress Notes (Signed)
Subjective:    Patient ID: Tabitha Ramirez, female    DOB: 1945/12/29, 66 y.o.   MRN: [id number]  HPI This 66 y.o. female presents for evaluation of a cough present x at least a month. Non-productive.  Worse at night.  Thought it was allergies, but Benadryl has not helped.  Throat lozenges help temporarily. Today felt like throat was closing slightly.  No indigestion, dyspepsia, nausea. Slight sore throat.  No nasal/sinus congestion, post-nasal drainage.  No diarrhea.  No SOB.  Some chest pain today with coughing.    Review of Systems As above.   Past Medical History  Diagnosis Date  . Heart murmur   . Hypertension   . Anemia   . Allergy     Past Surgical History  Procedure Date  . Abdominal hysterectomy     Prior to Admission medications   Medication Sig Start Date End Date Taking? Authorizing Provider  Calcium Carbonate-Vitamin D (CALCIUM 600+D HIGH POTENCY) 600-400 MG-UNIT per tablet Take 1 tablet by mouth daily. 03/07/11  Yes Mathis Dad, MD  estrogens, conjugated, (PREMARIN) 0.45 MG tablet Take 0.45 mg by mouth daily. Take daily for 21 days then do not take for 7 days.    Yes Historical Provider, MD  triamterene-hydrochlorothiazide (MAXZIDE-25) 37.5-25 MG per tablet Take 1 each (1 tablet total) by mouth daily. 11/29/11  Yes Mathis Dad, MD    Allergies  Allergen Reactions  . Penicillins     REACTION: hives    History   Social History  . Marital Status: Single    Spouse Name: n/a    Number of Children: 1  . Years of Education: 17   Occupational History  . retired     Therapist, sports  . subsitute     Social History Main Topics  . Smoking status: Never Smoker   . Smokeless tobacco: Never Used  . Alcohol Use: No  . Drug Use: No  . Sexually Active: Yes -- Female partner(s)    Birth Control/ Protection: Surgical   Other Topics Concern  . Not on file   Social History Narrative   Retired, worked for [employer] system in [employer] in 7/08.  Currently work as Lawyer    Family History  Problem Relation Age of Onset  . Cancer Mother   . Cancer Brother   . Diabetes Brother   . Heart disease Brother     AMI 05/22/2012       Objective:   Physical Exam  Blood pressure 100/50, pulse 90, temperature 98.5 F (36.9 C), temperature source Oral, resp. rate 16, height 5\' 3"  (1.6 m), weight 139 lb (63.05 kg), SpO2 99.00%. Body mass index is 24.62 kg/(m^2). Well-developed, well nourished BF who is awake, alert and oriented, in NAD. She coughs periodically during the interview and exam. HEENT: [place]/AT, PERRL, EOMI.  Sclera and conjunctiva are clear.  EAC are patent, TMs are normal in appearance. Nasal mucosa is pink and moist. OP is clear. Neck: supple, non-tender, no lymphadenopathy, thyromegaly. Heart: RRR, II/VI murmur heard best in the aortic space (not new). Lungs: normal effort, CTA Extremities: no cyanosis, clubbing or edema. Skin: warm and dry without rash.  [facility] reading (PRIMARY) by  Dr. Conley Rolls.  Thoracic scoliosis, seen on previous films.  Otherwise negative.  Results for orders placed in visit on 05/30/12  POCT CBC      Component Value Range   WBC 7.6  4.6 - 10.2 K/uL   Lymph, poc 2.9  0.6 - 3.4   POC LYMPH PERCENT 37.8  10 - 50 %L   MID (cbc) 0.7  0 - 0.9   POC MID % 8.6  0 - 12 %M   POC Granulocyte 4.1  2 - 6.9   Granulocyte percent 53.6  37 - 80 %G   RBC 3.21 (*) 4.04 - 5.48 M/uL   Hemoglobin 9.8 (*) 12.2 - 16.2 g/dL   HCT, POC 96.0 (*) 45.4 - 47.9 %   MCV 98.6 (*) 80 - 97 fL   MCH, POC 30.5  27 - 31.2 pg   MCHC 30.9 (*) 31.8 - 35.4 g/dL   RDW, POC 09.8     Platelet Count, POC 297  142 - 424 K/uL   MPV 8.5  0 - 99.8 fL        Assessment & Plan:   1. Cough  DG Chest 2 View, POCT CBC, azithromycin (ZITHROMAX) 250 MG tablet, chlorpheniramine-HYDROcodone (TUSSIONEX PENNKINETIC ER) 10-8 MG/5ML LQCR  2.   Anemia (not new)     Follow-up with PCP. RTC if symptoms worsen or persist.

## 2012-07-24 ENCOUNTER — Ambulatory Visit (INDEPENDENT_AMBULATORY_CARE_PROVIDER_SITE_OTHER): Payer: BC Managed Care – PPO | Admitting: Family Medicine

## 2012-07-24 VITALS — BP 104/55 | HR 90 | Temp 98.3°F | Resp 18 | Ht 64.0 in | Wt 150.0 lb

## 2012-07-24 DIAGNOSIS — J329 Chronic sinusitis, unspecified: Secondary | ICD-10-CM

## 2012-07-24 MED ORDER — AZITHROMYCIN 250 MG PO TABS: ORAL_TABLET | ORAL | Status: DC

## 2012-07-24 NOTE — Patient Instructions (Addendum)
Your blood pressure was 100/50 and 104/ 55 at your last 2 visits here.  This is low- we can probably decrease your blood pressure medicine.  If you would like, I think you can safely start taking just 1/2 of a blood pressure pill daily.  However, please feel free to consult with Dr. Anselm Jungling prior to making this change if you like.  Be sure to keep an eye on your blood pressure- if it starts running higher than 130/ 85 you may need to increase your dose of BP medicine again.   I have prescribed azithromycin for your current illness. However, you may have a viral illness which will get better with time.  If you prefer, you can hold onto the azithromycin rx and start it only if you do not get better in a few days.

## 2012-07-24 NOTE — Progress Notes (Signed)
[facility] [address], [place] [address] [phone]  Date:  07/24/2012   Name:  Tabitha Ramirez   DOB:  [birth year]   MRN:  [id number]  PCP:  Carrolyn Meiers, MD    Chief Complaint: Headache and Cough   History of Present Illness:  Tabitha Ramirez is a 66 y.o. very pleasant female patient who presents with the following:  She is here today with illness.  She has noted 3 days of feeling feverish,  cough and nasal congestion.  She has not actually checked her temperature. She has noted some chills but has not had boy aches.  She has a mild cough- "it is more like congestion."  She is not not coughing anything up.    She has anemia but this is not new- she has had this "her whole life" and has been told it is genetic.   She does not have any GI symptoms such as N/V/ or diarrhea  She has used some cough syrup, and a [facility] cold and flu medication for people with HTN  Of note, her BP has been quite low at her last 2 OV.  She is on BP medication and has a PCP, Dr. Anselm Jungling.    Patient Active Problem List  Diagnosis  . HYPOKALEMIA  . ESSENTIAL HYPERTENSION, BENIGN  . SKIN RASH  . Anemia  . Heart murmur  . Seasonal allergies  . Hypertriglyceridemia  . Post-menopausal  . Need for influenza vaccination    Past Medical History  Diagnosis Date  . Heart murmur   . Hypertension   . Anemia   . Allergy   . Arthritis     Past Surgical History  Procedure Date  . Abdominal hysterectomy     History  Substance Use Topics  . Smoking status: Never Smoker   . Smokeless tobacco: Never Used  . Alcohol Use: No    Family History  Problem Relation Age of Onset  . Cancer Mother   . Cancer Brother   . Diabetes Brother   . Heart disease Brother     AMI 05/22/2012    Allergies  Allergen Reactions  . Penicillins     REACTION: hives    Medication list has been reviewed and updated.  Current Outpatient Prescriptions on File Prior to Visit  Medication Sig Dispense  Refill  . Calcium Carbonate-Vitamin D (CALCIUM 600+D HIGH POTENCY) 600-400 MG-UNIT per tablet Take 1 tablet by mouth daily.  30 tablet  11  . estrogens, conjugated, (PREMARIN) 0.45 MG tablet Take 0.45 mg by mouth daily. Take daily for 21 days then do not take for 7 days.       Marland Kitchen triamterene-hydrochlorothiazide (MAXZIDE-25) 37.5-25 MG per tablet Take 1 each (1 tablet total) by mouth daily.  90 tablet  4  . chlorpheniramine-HYDROcodone (TUSSIONEX PENNKINETIC ER) 10-8 MG/5ML LQCR Take 5 mLs by mouth every 12 (twelve) hours as needed (cough).  140 mL  0    Review of Systems:  As per HPI- otherwise negative.   Physical Examination: Filed Vitals:   07/24/12 1620  BP: 104/55  Pulse: 90  Temp: 98.3 F (36.8 C)  Resp: 18   Filed Vitals:   07/24/12 1620  Height: 5\' 4"  (1.626 m)  Weight: 150 lb (68.04 kg)   Body mass index is 25.75 kg/(m^2). Ideal Body Weight: Weight in (lb) to have BMI = 25: 145.3   GEN: WDWN, NAD, Non-toxic, A & O x 3, looks well HEENT: Atraumatic, Normocephalic. Neck  supple. No masses, No LAD.  Bilateral TM wnl, oropharynx normal.  PEERL,EOMI.   Nasal cavity is congested Ears and Nose: No external deformity. CV: RRR, No M/G/R. No JVD. No thrill. No extra heart sounds. PULM: CTA B, no wheezes, crackles, rhonchi. No retractions. No resp. distress. No accessory muscle use. EXTR: No c/c/e NEURO Normal gait.  PSYCH: Normally interactive. Conversant. Not depressed or anxious appearing.  Calm demeanor.    Assessment and Plan: 1. Sinusitis  azithromycin (ZITHROMAX) 250 MG tablet   See pt instructions for more.  Patient (or parent if minor) instructed to return to clinic or call if not better in 3-4 day(s).  Advised her that she may have a virus- however she came here for the expressed purpose of "getting an antibiotic" so I did give her an rx to have on hand to use if she does not get better.   Meds ordered this encounter  Medications  . azithromycin (ZITHROMAX) 250 MG  tablet    Sig: Use as a zpack    Dispense:  6 tablet    Refill:  0      Lailani Tool, MD

## 2013-01-13 ENCOUNTER — Other Ambulatory Visit (HOSPITAL_COMMUNITY): Payer: Self-pay | Admitting: Obstetrics and Gynecology

## 2013-01-15 ENCOUNTER — Other Ambulatory Visit: Payer: Self-pay | Admitting: Obstetrics and Gynecology

## 2013-01-15 DIAGNOSIS — N6310 Unspecified lump in the right breast, unspecified quadrant: Secondary | ICD-10-CM

## 2013-01-31 ENCOUNTER — Ambulatory Visit
Admission: RE | Admit: 2013-01-31 | Discharge: 2013-01-31 | Disposition: A | Discharge status: Home / Self Care | Payer: Medicare Other | Source: Ambulatory Visit | Attending: Obstetrics and Gynecology | Admitting: Obstetrics and Gynecology

## 2013-01-31 ENCOUNTER — Other Ambulatory Visit: Payer: Self-pay | Admitting: Obstetrics and Gynecology

## 2013-01-31 DIAGNOSIS — N6310 Unspecified lump in the right breast, unspecified quadrant: Secondary | ICD-10-CM

## 2013-02-03 ENCOUNTER — Ambulatory Visit
Admission: RE | Admit: 2013-02-03 | Discharge: 2013-02-03 | Disposition: A | Discharge status: Home / Self Care | Payer: Medicare Other | Source: Ambulatory Visit | Attending: Obstetrics and Gynecology | Admitting: Obstetrics and Gynecology

## 2013-02-03 ENCOUNTER — Other Ambulatory Visit: Payer: Self-pay | Admitting: Obstetrics and Gynecology

## 2013-02-03 DIAGNOSIS — C50911 Malignant neoplasm of unspecified site of right female breast: Secondary | ICD-10-CM

## 2013-02-03 DIAGNOSIS — N6310 Unspecified lump in the right breast, unspecified quadrant: Secondary | ICD-10-CM

## 2013-02-04 ENCOUNTER — Telehealth: Payer: Self-pay | Admitting: *Deleted

## 2013-02-04 DIAGNOSIS — C50919 Malignant neoplasm of unspecified site of unspecified female breast: Secondary | ICD-10-CM | POA: Insufficient documentation

## 2013-02-04 DIAGNOSIS — C50111 Malignant neoplasm of central portion of right female breast: Secondary | ICD-10-CM

## 2013-02-04 NOTE — Telephone Encounter (Signed)
Confirmed [facility] for 02/12/13 at 0800 .  Instructions and contact information given. 

## 2013-02-10 ENCOUNTER — Ambulatory Visit
Admission: RE | Admit: 2013-02-10 | Discharge: 2013-02-10 | Disposition: A | Discharge status: Home / Self Care | Payer: Medicare Other | Source: Ambulatory Visit | Attending: Obstetrics and Gynecology | Admitting: Obstetrics and Gynecology

## 2013-02-10 DIAGNOSIS — C50911 Malignant neoplasm of unspecified site of right female breast: Secondary | ICD-10-CM

## 2013-02-10 MED ORDER — GADOBENATE DIMEGLUMINE 529 MG/ML IV SOLN
14.0000 mL | Freq: Once | INTRAVENOUS | Status: AC | PRN
  Administered 2013-02-10: 14 mL via INTRAVENOUS

## 2013-02-12 ENCOUNTER — Encounter: Payer: Self-pay | Admitting: *Deleted

## 2013-02-12 ENCOUNTER — Encounter: Payer: Self-pay | Admitting: Oncology

## 2013-02-12 ENCOUNTER — Telehealth: Payer: Self-pay | Admitting: Oncology

## 2013-02-12 ENCOUNTER — Ambulatory Visit (HOSPITAL_BASED_OUTPATIENT_CLINIC_OR_DEPARTMENT_OTHER): Payer: Medicare Other | Admitting: Oncology

## 2013-02-12 ENCOUNTER — Encounter (INDEPENDENT_AMBULATORY_CARE_PROVIDER_SITE_OTHER): Payer: Self-pay | Admitting: General Surgery

## 2013-02-12 ENCOUNTER — Ambulatory Visit: Payer: Medicare Other | Attending: General Surgery | Admitting: Physical Therapy

## 2013-02-12 ENCOUNTER — Ambulatory Visit
Admission: RE | Admit: 2013-02-12 | Discharge: 2013-02-12 | Disposition: A | Discharge status: Home / Self Care | Payer: Medicare Other | Source: Ambulatory Visit | Attending: Radiation Oncology | Admitting: Radiation Oncology

## 2013-02-12 ENCOUNTER — Ambulatory Visit: Payer: Medicare Other

## 2013-02-12 ENCOUNTER — Ambulatory Visit (HOSPITAL_BASED_OUTPATIENT_CLINIC_OR_DEPARTMENT_OTHER): Payer: Medicare Other | Admitting: General Surgery

## 2013-02-12 ENCOUNTER — Other Ambulatory Visit (HOSPITAL_BASED_OUTPATIENT_CLINIC_OR_DEPARTMENT_OTHER): Payer: Medicare Other | Admitting: Lab

## 2013-02-12 VITALS — BP 125/76 | HR 73 | Temp 98.0°F | Resp 18 | Ht 64.0 in | Wt 158.7 lb

## 2013-02-12 DIAGNOSIS — C50119 Malignant neoplasm of central portion of unspecified female breast: Secondary | ICD-10-CM

## 2013-02-12 DIAGNOSIS — C50919 Malignant neoplasm of unspecified site of unspecified female breast: Secondary | ICD-10-CM | POA: Insufficient documentation

## 2013-02-12 DIAGNOSIS — C50111 Malignant neoplasm of central portion of right female breast: Secondary | ICD-10-CM

## 2013-02-12 DIAGNOSIS — Z171 Estrogen receptor negative status [ER-]: Secondary | ICD-10-CM

## 2013-02-12 DIAGNOSIS — IMO0001 Reserved for inherently not codable concepts without codable children: Secondary | ICD-10-CM | POA: Insufficient documentation

## 2013-02-12 DIAGNOSIS — R293 Abnormal posture: Secondary | ICD-10-CM | POA: Insufficient documentation

## 2013-02-12 LAB — COMPREHENSIVE METABOLIC PANEL (CC13)
Albumin: 3.4 g/dL — ABNORMAL LOW (ref 3.5–5.0)
Alkaline Phosphatase: 90 U/L (ref 40–150)
CO2: 28 mEq/L (ref 22–29)
Glucose: 115 mg/dl — ABNORMAL HIGH (ref 70–99)
Potassium: 3.4 mEq/L — ABNORMAL LOW (ref 3.5–5.1)
Sodium: 141 mEq/L (ref 136–145)
Total Protein: 7.9 g/dL (ref 6.4–8.3)

## 2013-02-12 LAB — CBC WITH DIFFERENTIAL/PLATELET
Eosinophils Absolute: 0.7 10*3/uL — ABNORMAL HIGH (ref 0.0–0.5)
HCT: 32.3 % — ABNORMAL LOW (ref 34.8–46.6)
HGB: 11.1 g/dL — ABNORMAL LOW (ref 11.6–15.9)
MCH: 32.8 pg (ref 25.1–34.0)
MCHC: 34.2 g/dL (ref 31.5–36.0)
MCV: 95.9 fL (ref 79.5–101.0)
MONO#: 0.7 10*3/uL (ref 0.1–0.9)
MONO%: 8 % (ref 0.0–14.0)
Platelets: 298 10*3/uL (ref 145–400)
lymph#: 3.6 10*3/uL — ABNORMAL HIGH (ref 0.9–3.3)

## 2013-02-12 NOTE — Addendum Note (Signed)
Addended by: Donnelly Angelica on: 02/12/2013 10:55 AM   Modules accepted: Orders

## 2013-02-12 NOTE — Progress Notes (Signed)
Patient ID: Tabitha Ramirez, female   DOB: [birth year], 67 y.o.   MRN: [id number]  No chief complaint on file.   HPI Tabitha Ramirez is a 67 y.o. female.  She was referred by Dr. Baird Lyons at the breast center [facility] for evaluation and management of a locally advanced cancer of the right breast at the 12:00 position. She is being evaluated in the [facility] today by Dr. Darnelle Catalan, Dr. Michell Heinrich, and me.Her primary care physician is Dr. Rosana Berger at the [facility] internal medicine clinic.  This patient is generally healthy. She has not had a mammogram in 3 or 4 years. She felt a lump in her right breast superiorly for about 3 months. She went for mammograms which show a 4.3 cm mass in the upper outer quadrant of the right breast. The breasts are dense. There are diffuse  calcifications in both breasts. No mass in the left breast. Ultrasound shows a 2.5 cm right breast mass at 12:00 and prominent right axillary lymph nodes measuring as large as 2.5 cm one dimension. Image guided biopsy of the right breast mass shows a triple negative for breast cancer, invasive mammary carcinoma. Biopsies of the lymph node shows benign lymphatic tissue. MRI showed a 3.4 cm mass in the right breast at 12:00 position. The axilla looked relatively normal, borderline enlargement. Left breast and left axilla were normal.  There is some concern expressed by our   treatment team that the right axillary lymph node biopsy may be a false negative. The patient is interested in breast conservation surgery and so we're going to plan neoadjuvant chemotherapy.  Past history is significant for current estrogen therapy, which has been discussed and she was advised to discontinue, TAH and BSO, hypertension, hyperlipidemia, anemia. Family history reveals some type of cancer in mother and grandmother but probably not breast cancer. Colon cancer in a brother. HPI  Past Medical History  Diagnosis Date  . Heart murmur   . Hypertension   . Anemia   .  Allergy   . Arthritis   . Breast cancer   . Migraines     Past Surgical History  Procedure Laterality Date  . Abdominal hysterectomy      With bilateral salpingo-oophorectomy    Family History  Problem Relation Age of Onset  . Cancer Mother   . Congestive Heart Failure Mother   . Cancer Brother   . Diabetes Brother   . Heart disease Brother     AMI 05/22/2012  . Congestive Heart Failure Brother   . Cancer Maternal Grandmother     Social History History  Substance Use Topics  . Smoking status: Never Smoker   . Smokeless tobacco: Never Used  . Alcohol Use: No    Allergies  Allergen Reactions  . Penicillins     REACTION: hives    Current Outpatient Prescriptions  Medication Sig Dispense Refill  . Boswellia Serrata Extract POWD 250 mg by Does not apply route daily.      . Calcium Carbonate-Vitamin D (CALCIUM 600+D HIGH POTENCY) 600-400 MG-UNIT per tablet Take 1 tablet by mouth daily.  30 tablet  11  . estrogens, conjugated, (PREMARIN) 0.45 MG tablet Take 0.45 mg by mouth daily. Take daily for 21 days then do not take for 7 days.       . Misc Natural Products (WHITE WILLOW BARK PO) Take 400 mg by mouth daily.      Marland Kitchen triamterene-hydrochlorothiazide (MAXZIDE-25) 37.5-25 MG per tablet Take 1 each (1 tablet  total) by mouth daily.  90 tablet  4   No current facility-administered medications for this visit.    Review of Systems Review of Systems  Constitutional: Negative for fever, chills and unexpected weight change.  HENT: Negative for hearing loss, congestion, sore throat, trouble swallowing and voice change.   Eyes: Negative for visual disturbance.  Respiratory: Negative for cough and wheezing.   Cardiovascular: Negative for chest pain, palpitations and leg swelling.  Gastrointestinal: Negative for nausea, vomiting, abdominal pain, diarrhea, constipation, blood in stool, abdominal distention and anal bleeding.  Genitourinary: Negative for hematuria, vaginal bleeding  and difficulty urinating.  Musculoskeletal: Negative for arthralgias.  Skin: Negative for rash and wound.  Neurological: Negative for seizures, syncope and headaches.  Hematological: Negative for adenopathy. Does not bruise/bleed easily.  Psychiatric/Behavioral: Negative for confusion.    There were no vitals taken for this visit.  Physical Exam Physical Exam  Constitutional: She is oriented to person, place, and time. She appears well-developed and well-nourished. No distress.  HENT:  Head: Normocephalic and atraumatic.  Nose: Nose normal.  Mouth/Throat: No oropharyngeal exudate.  Eyes: Conjunctivae and EOM are normal. Pupils are equal, round, and reactive to light. Left eye exhibits no discharge. No scleral icterus.  Neck: Neck supple. No JVD present. No tracheal deviation present. No thyromegaly present.  Cardiovascular: Normal rate, regular rhythm, normal heart sounds and intact distal pulses.   No murmur heard. Pulmonary/Chest: Effort normal and breath sounds normal. No respiratory distress. She has no wheezes. She has no rales. She exhibits no tenderness.    4 cm mass right breast 12:00 position just at about the areolar margin. No skin change. No normal. No axillary mass. Left breast and axilla unremarkable.  Abdominal: Soft. Bowel sounds are normal. She exhibits no distension and no mass. There is no tenderness. There is no rebound and no guarding.  Healed Pfannenstiel incision  Musculoskeletal: She exhibits no edema and no tenderness.  Lymphadenopathy:    She has no cervical adenopathy.  Neurological: She is alert and oriented to person, place, and time. She exhibits normal muscle tone. Coordination normal.  Skin: Skin is warm. No rash noted. She is not diaphoretic. No erythema. No pallor.  Psychiatric: She has a normal mood and affect. Her behavior is normal. Judgment and thought content normal.    Data Reviewed Imaging studies. Histopathology. Case discussed at breast  conference this morning. Treatment plan coordinated among Dr. Darnelle Catalan, Dr. Michell Heinrich, and me.  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 3.5 cm or greater.  Question of abnormal right axillary lymph node, status post biopsy.  Hypertension  Anemia  History of TAH and BSO.    Plan    Discontinue estrogens. Patient advised  She is interested in breast conservation, and so Dr. Darnelle Catalan plans of neoadjuvant chemotherapy for 5 months  Because of the concern that the right axillary lymph node biopsy might be a false negative, we have advised a pretreatment right axilla sentinel node biopsy. The patient is in agreement with this  The patient will be scheduled for Port-A-Cath insertion and right axillary sentinel lymph node biopsy. After that is done she will proceed with neoadjuvant chemotherapy and restaging and hopefully right partial mastectomy. Eventually she'll have radiation therapy  I have discussed the indications, details, techniques, numerous risks of SLN biopsy and Port-A-Cath insertion. She is aware of the risk of bleeding, infection, arm numbness, or swelling, pneumothorax, and blood clots, catheter malfunction and  breakage and other unforeseen problems. She understands these issues well. Her questions are answered. She agrees with this plan.        Angelia Mould. Derrell Lolling, M.D., [facility] General and Minimally invasive Surgery Breast and Colorectal Surgery Office:   [phone] Pager:   [phone]  02/12/2013, 10:58 AM

## 2013-02-12 NOTE — Progress Notes (Signed)
ID: Tabitha Ramirez OB: 1945-10-07  MR#: [id number]  CSN#:[id number]  PCP: Carrolyn Meiers, MD GYN:  Lavina Hamman SU: Claud Kelp OTHER MD: Lurline Hare   HISTORY OF PRESENT ILLNESS: Tabitha Ramirez tells me she felt a mass in her right breast about 3 months ago but initially ignored it. She eventually brought it to her physician's attention and was set up for bilateral diagnostic mammography at the breast Center 01/31/2013. This showed an area of distortion in the upper outer quadrant measuring approximately 4.3 cm. Diffuse calcifications in both breasts were stable. The mass was palpable at 12:00, 3 cm from the nipple, and by ultrasound it was irregular, hypoechoic, and measured 2.5 cm. In addition there was prominent right axillary adenopathy the largest lymph node measuring 2.5 cm.  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 Biopsy of the lymph node was negative. It did show some lymph node tissue.  Bilateral breast MRI 02/10/2013 showed a ring-enhancing necrotic-appearing 3.4 cm mass in the anterior third of the right breast. There was also a 2.0 cm level one right axillary lymph node that was prominent, but maintains central fat. The left breast the left axilla were benign and there was no internal mammary adenopathy noted.  The patient's subsequent history is as detailed below.  INTERVAL HISTORY: Tabitha Ramirez was seen at the multidisciplinary breast cancer clinic 02/12/2013 accompanied by her daughter Tabitha Ramirez  REVIEW OF SYSTEMS: Tabitha Ramirez tolerated the biopsy without unusual complications or side effects. She is currently sleeping poorly, has some seasonal sinus problems, need 30s "looked after", and has a history of a heart murmur (remote). She has some arthritis pains here in there, but no place that particularly hurts more than another, and all these are fleeting and  slight discomforts. A detailed review of systems today was otherwise entirely negative. The patient is not exercising regularly.  PAST MEDICAL HISTORY: Past Medical History  Diagnosis Date  . Heart murmur   . Hypertension   . Anemia   . Allergy   . Arthritis   . Breast cancer   . Migraines     PAST SURGICAL HISTORY: Past Surgical History  Procedure Laterality Date  . Abdominal hysterectomy      With bilateral salpingo-oophorectomy    FAMILY HISTORY Family History  Problem Relation Age of Onset  . Cancer Mother   . Congestive Heart Failure Mother   . Cancer Brother   . Diabetes Brother   . Heart disease Brother     AMI 05/22/2012  . Congestive Heart Failure Brother   . Cancer Maternal Grandmother    the patient's father died in an automobile accident at the age of 38. The patient's mother lived to be 51. Tabitha Ramirez has 3 brothers, no sisters. One brother had colon cancer at the age of 15. The patient's mother and the patient's mothers mother (maternal grandmother) both were diagnosed with some kind of cancer at the age of 59 or thereabouts, but Tabitha Ramirez does not know what type of cancer this may have been.  GYNECOLOGIC HISTORY:  Menarche age 63, first live birth age 39. The patient had her hysterectomy in the 1970s. She has been on estrogen replacement for she thinks 18 years.  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 She is now retired appears she is divorced, lives by herself, with no  pets. Her first child was given up for adoption. Her second child, Tabitha Ramirez, is a Geophysicist/field seismologist and is working towards a PhD at [employer]. Tabitha Ramirez has no grandchildren. She attends [employer]    ADVANCED DIRECTIVES: Not in place; the patient intends to name her daughter Tabitha Ramirez as her healthcare power of attorney. Tabitha Ramirez can be reached at [phone]   HEALTH  MAINTENANCE: History  Substance Use Topics  . Smoking status: Never Smoker   . Smokeless tobacco: Never Used  . Alcohol Use: No     Colonoscopy: August 2012  PAP:  Bone density: Never  Lipid panel:  Allergies  Allergen Reactions  . Penicillins     REACTION: hives    Current Outpatient Prescriptions  Medication Sig Dispense Refill  . azithromycin (ZITHROMAX) 250 MG tablet Use as a zpack  6 tablet  0  . Calcium Carbonate-Vitamin D (CALCIUM 600+D HIGH POTENCY) 600-400 MG-UNIT per tablet Take 1 tablet by mouth daily.  30 tablet  11  . chlorpheniramine-HYDROcodone (TUSSIONEX PENNKINETIC ER) 10-8 MG/5ML LQCR Take 5 mLs by mouth every 12 (twelve) hours as needed (cough).  140 mL  0  . estrogens, conjugated, (PREMARIN) 0.45 MG tablet Take 0.45 mg by mouth daily. Take daily for 21 days then do not take for 7 days.       Marland Kitchen triamterene-hydrochlorothiazide (MAXZIDE-25) 37.5-25 MG per tablet Take 1 each (1 tablet total) by mouth daily.  90 tablet  4   No current facility-administered medications for this visit.    OBJECTIVE: Middle-aged [place] American woman who appears well Filed Vitals:   02/12/13 0847  BP: 125/76  Pulse: 73  Temp: 98 F (36.7 C)  Resp: 18     Body mass index is 27.23 kg/(m^2).    ECOG FS: 0  Sclerae unicteric Oropharynx clear No cervical or supraclavicular adenopathy Lungs no rales or rhonchi Heart regular rate and rhythm Abd benign MSK no focal spinal tenderness, no peripheral edema Neuro: nonfocal, well oriented, appropriate affect Breasts: There is a palpable mass in the superior aspect of the right breast, above the nipple, measuring at least 2 cm,. There are no skin changes. I do not palpate any right axillary adenopathy. The left breast is unremarkable   LAB RESULTS:  CMP     Component Value Date/Time   NA 141 02/12/2013 0820   NA 138 11/29/2011 1535   K 3.4* 02/12/2013 0820   K 3.5 11/29/2011 1535   CL 103 02/12/2013 0820   CL 101 11/29/2011 1535    CO2 28 02/12/2013 0820   CO2 26 11/29/2011 1535   GLUCOSE 115* 02/12/2013 0820   GLUCOSE 121* 11/29/2011 1535   BUN 19.5 02/12/2013 0820   BUN 19 11/29/2011 1535   CREATININE 1.1 02/12/2013 0820   CREATININE 0.94 11/29/2011 1535   CREATININE 1.09 06/29/2010 2325   CALCIUM 9.5 02/12/2013 0820   CALCIUM 9.5 11/29/2011 1535   PROT 7.9 02/12/2013 0820   PROT 7.5 11/29/2011 1535   ALBUMIN 3.4* 02/12/2013 0820   ALBUMIN 3.9 11/29/2011 1535   AST 26 02/12/2013 0820   AST 17 11/29/2011 1535   ALT 29 02/12/2013 0820   ALT 12 11/29/2011 1535   ALKPHOS 90 02/12/2013 0820   ALKPHOS 94 11/29/2011 1535   BILITOT 0.40 02/12/2013 0820   BILITOT 0.3 11/29/2011 1535   GFRNONAA >60 03/17/2009 2213   GFRAA >60 03/17/2009 2213    I No results found for this  basename: SPEP,  UPEP,   kappa and lambda light chains    Lab Results  Component Value Date   WBC 9.1 02/12/2013   NEUTROABS 3.8 02/12/2013   HGB 11.1* 02/12/2013   HCT 32.3* 02/12/2013   MCV 95.9 02/12/2013   PLT 298 02/12/2013      Chemistry      Component Value Date/Time   NA 141 02/12/2013 0820   NA 138 11/29/2011 1535   K 3.4* 02/12/2013 0820   K 3.5 11/29/2011 1535   CL 103 02/12/2013 0820   CL 101 11/29/2011 1535   CO2 28 02/12/2013 0820   CO2 26 11/29/2011 1535   BUN 19.5 02/12/2013 0820   BUN 19 11/29/2011 1535   CREATININE 1.1 02/12/2013 0820   CREATININE 0.94 11/29/2011 1535   CREATININE 1.09 06/29/2010 2325      Component Value Date/Time   CALCIUM 9.5 02/12/2013 0820   CALCIUM 9.5 11/29/2011 1535   ALKPHOS 90 02/12/2013 0820   ALKPHOS 94 11/29/2011 1535   AST 26 02/12/2013 0820   AST 17 11/29/2011 1535   ALT 29 02/12/2013 0820   ALT 12 11/29/2011 1535   BILITOT 0.40 02/12/2013 0820   BILITOT 0.3 11/29/2011 1535       No results found for this basename: LABCA2    No components found with this basename: [id number]    No results found for this basename: INR,  in the last 168 hours  Urinalysis    Component Value Date/Time   COLORURINE YELLOW 03/22/2008 0351    APPEARANCEUR CLEAR 03/22/2008 0351   LABSPEC 1.013 03/22/2008 0351   PHURINE 6.0 03/22/2008 0351   GLUCOSEU NEGATIVE 03/22/2008 0351   HGBUR MODERATE* 03/22/2008 0351   BILIRUBINUR NEGATIVE 03/22/2008 0351   KETONESUR NEGATIVE 03/22/2008 0351   PROTEINUR NEGATIVE 03/22/2008 0351   UROBILINOGEN 0.2 03/22/2008 0351   NITRITE NEGATIVE 03/22/2008 0351   LEUKOCYTESUR NEGATIVE 03/22/2008 0351    STUDIES: US Breast Right  01/31/2013   *RADIOLOGY REPORT*  Clinical Data:  Palpable right breast mass  DIGITAL DIAGNOSTIC BILATERAL MAMMOGRAM WITH CAD AND RIGHT BREAST ULTRASOUND:  Comparison:  With priors  Findings:  ACR Breast Density Category 3: The breast tissue is heterogeneously dense.  There is an obscured spiculated mass and distortion in the upper outer quadrant of the right breast.  The measurements are difficult to give because the lesion is obscured.  It measures approximately 3.9 x 4.3 x 4.1 cm.  There are diffuse calcifications in both breast that are stable.  There is no mass in the left breast.  Mammographic images were processed with CAD.  On physical exam, I palpate a discrete mass in the right breast at 12 o'clock 3 cm from the nipple.  Ultrasound is performed, showing there is an irregular, hypoechoic mass in the right breast at 12 o'clock 3 cm from the nipple measuring 2.5 x 1.7 x 1.9 cm.  Sonographic evaluation of the right axilla shows prominent adenopathy.  The largest lymph node measures 2.5 cm.  IMPRESSION: Imaging findings are worrisome for right breast invasive mammary carcinoma with axillary metastasis.  Tissue sampling is recommended.  RECOMMENDATION: Ultrasound guided core biopsies of the right breast as well as a right axilla will be performed and dictated separately.  I have discussed the findings and recommendations with the patient. Results were also provided in writing at the conclusion of the visit.  If applicable, a reminder letter will be sent to the patient regarding the next appointment.  BI-RADS CATEGORY 5:  Highly suggestive of malignancy - appropriate action should be taken.   Original Report Authenticated By: Baird Lyons, M.D.   Mr Breast Bilateral W Wo Contrast  02/10/2013   *RADIOLOGY REPORT*  Clinical Data: Recently diagnosed right breast invasive mammary carcinoma.  BUN and creatinine were obtained on site at [facility] at [address] Results:  BUN 18 mg/dL,  Creatinine 1.1 mg/dL.  BILATERAL BREAST MRI WITH AND WITHOUT CONTRAST  Technique: Multiplanar, multisequence MR images of both breasts were obtained prior to and following the intravenous administration of 14ml of multihance.  Three dimensional images were evaluated at the independent DynaCad workstation.  Comparison:  Mammograms dated 01/31/2013 and 11/13/2008.  Findings: There is a dense background parenchymal enhancement pattern.  Right breast: There is a ring enhancing / necrotic appearing 3.4 x 2.6 x 2.7 cm mass in the anterior/middle third of the 12 o'clock region of the right breast.  Biopsy clip artifact is seen in the mass.  Left breast:  No abnormal enhancement is seen in the left breast.  There is a 2.0 cm level I right axillary lymph node that appears prominent but maintains central fat.  An axillary lymph node biopsy was performed and found to be benign.  Correlation with sentinel lymph node biopsy would be suggested.  No abnormal left axillary adenopathy is detected.  There is no internal mammary adenopathy.  IMPRESSION: 3.4 cm enhancing mass in the 12 o'clock region of the right breast corresponding with the known malignancy.  RECOMMENDATION: Treatment planning.  THREE-DIMENSIONAL MR IMAGE RENDERING ON INDEPENDENT WORKSTATION:  Three-dimensional MR images were rendered by post-processing of the original MR data on an independent workstation.  The three- dimensional MR images were interpreted, and findings were reported in the accompanying complete MRI report for this study.  BI-RADS CATEGORY 6:  Known  biopsy-proven malignancy - appropriate action should be taken.   Original Report Authenticated By: Baird Lyons, M.D.   Mm Digital Diagnostic Bilat  01/31/2013   *RADIOLOGY REPORT*  Clinical Data:  Palpable right breast mass  DIGITAL DIAGNOSTIC BILATERAL MAMMOGRAM WITH CAD AND RIGHT BREAST ULTRASOUND:  Comparison:  With priors  Findings:  ACR Breast Density Category 3: The breast tissue is heterogeneously dense.  There is an obscured spiculated mass and distortion in the upper outer quadrant of the right breast.  The measurements are difficult to give because the lesion is obscured.  It measures approximately 3.9 x 4.3 x 4.1 cm.  There are diffuse calcifications in both breast that are stable.  There is no mass in the left breast.  Mammographic images were processed with CAD.  On physical exam, I palpate a discrete mass in the right breast at 12 o'clock 3 cm from the nipple.  Ultrasound is performed, showing there is an irregular, hypoechoic mass in the right breast at 12 o'clock 3 cm from the nipple measuring 2.5 x 1.7 x 1.9 cm.  Sonographic evaluation of the right axilla shows prominent adenopathy.  The largest lymph node measures 2.5 cm.  IMPRESSION: Imaging findings are worrisome for right breast invasive mammary carcinoma with axillary metastasis.  Tissue sampling is recommended.  RECOMMENDATION: Ultrasound guided core biopsies of the right breast as well as a right axilla will be performed and dictated separately.  I have discussed the findings and recommendations with the patient. Results were also provided in writing at the conclusion of the visit.  If applicable, a reminder letter will be sent to the patient regarding the  next appointment.  BI-RADS CATEGORY 5:  Highly suggestive of malignancy - appropriate action should be taken.   Original Report Authenticated By: Baird Lyons, M.D.   Mm Digital Diagnostic Unilat R  01/31/2013   *RADIOLOGY REPORT*  Clinical Data:  Status post ultrasound-guided core biopsy  of a mass in the 12 o'clock region of the right breast  DIGITAL DIAGNOSTIC RIGHT MAMMOGRAM  Comparison:  Previous exams.  Findings:  Films are performed following ultrasound guided biopsy of a mass in the 12 o'clock region of the right breast. Mammographic images show there is a ribbon shaped clip in the 12 o'clock region of the right breast.  IMPRESSION: Status post ultrasound-guided core biopsy of the right breast with pathology pending.   Original Report Authenticated By: Baird Lyons, M.D.   Mm Radiologist Eval And Mgmt  02/03/2013   *RADIOLOGY REPORT*  ESTABLISHED PATIENT OFFICE VISIT - LEVEL II [phone])  Chief Complaint:  The patient presented with a palpable abnormality in the right breast.  History:  Diagnostic exam of the right breast showed a suspicious mass and axillary adenopathy.  Biopsy of the right breast mass as well a right axillary lymph node was performed.  Exam:  The wound sites are clean and dry with no significant hematoma, or signs of infection.  Pathology: Invasive mammary carcinoma was reported from the mass in the 12 o'clock region of the right breast.  The right axillary lymph node was found to be benign.  Assessment and Plan:  The patient has been scheduled to be seen at the Multidisciplinary Clinic on 02/12/2013.  Informational packet was provided.  Breast MRI has been arranged.   Original Report Authenticated By: Baird Lyons, M.D.   [place] Rt Breast Bx W Loc Dev 1st Lesion Img Bx Spec US Guide  01/31/2013   *RADIOLOGY REPORT*  Clinical Data:  Suspicious right breast mass  ULTRASOUND GUIDED VACUUM ASSISTED CORE BIOPSY OF THE RIGHT BREAST  Comparison: Previous exams.  I met with the patient and we discussed the procedure of ultrasound- guided biopsy, including benefits and alternatives.  We discussed the high likelihood of a successful procedure. We discussed the risks of the procedure including infection, bleeding, tissue injury, clip migration, and inadequate sampling.  Informed written  consent was given.  Using sterile technique, 2% lidocaine ultrasound guidance and a 12 gauge vacuum assisted needle biopsy was performed of the mass in the 12 o'clock region of the right breast using an inferior lateral approach.  At the conclusion of the procedure, a ribbon shaped tissue marker clip was deployed into the biopsy cavity.  Follow-up 2-view mammogram was performed and dictated separately.  IMPRESSION: Ultrasound-guided biopsy of the right breast.  No apparent complications.   Original Report Authenticated By: Baird Lyons, M.D.   [place] Rt Breast Bx W Loc Dev Ea Add Lesion Img Bx Spec US Guide  01/31/2013   *RADIOLOGY REPORT*  Clinical Data:  Suspicious right axillary adenopathy  ULTRASOUND GUIDED CORE BIOPSY OF THE RIGHT AXILLA  Comparison: Previous exams.  I met with the patient and we discussed the procedure of ultrasound- guided biopsy, including benefits and alternatives.  We discussed the high likelihood of a successful procedure. We discussed the risks of the procedure, including infection, bleeding, tissue injury, clip migration, and inadequate sampling.  Informed written consent was given.  Using sterile technique 2% lidocaine, ultrasound guidance and a 14 gauge automated biopsy device, biopsy was performed of a right axillary lymph node using an inferior lateral approach.  IMPRESSION: Ultrasound  guided biopsy of a right axillary lymph node.  No apparent complications.   Original Report Authenticated By: Baird Lyons, M.D.    ASSESSMENT: 67 y.o. Tabitha Ramirez woman status post right breast biopsy 01/31/2013 for a clinical T2 Nx, stage II invasive ductal carcinoma, grade 3, triple negative, with an MIB-1 of 84%  (1) biopsy of an enlarged (2.5 cm) right axillary lymph node 01/31/2013 was negative  PLAN: We spent the better part of today's hour-plus visit discussing the biology of breast cancer and the specifics of her own situation. She understands in particular that the enlarged lymph node in her  axilla is still suspicious for cancer, and that it will make a significant difference to her final treatment (especially radiation) whether that lymph node is positive or not. She understands there is no survival advantage between lumpectomy and radiation as compared to mastectomy, and also no survival advantage to treating neoadjuvant versus adjuvant chemotherapy.  Having understood her situation well, our recommendation to her is that she have a port placed for neoadjuvant chemotherapy and at the same time have a sentinel lymph node biopsy. That will clear the air regarding radiation planning one way or the other. She will then have chemotherapy which will consist of doxorubicin and cyclophosphamide given in dose dense fashion x4 followed by weekly carboplatin and paclitaxel x12.  A significant proportion of patients in her situation due at taking a complete pathologic response, and if so she might be able to qualify for one of our studies that try to minimize the use of radiation in that setting. She also qualifies for our Accure study and she is interested in participating.  [place] understands that her use of estrogen did not cause her cancer and is not feeding her cancer, but our standard recommendations still is that she go off that medication now. The estrogen replacements certainly has contributed to her breast density, and that of course limits mammographic sensitivity significantly.  I am making the patient a return appointment with me in approximately 2 weeks so we can start making definitive chemotherapy plans. In addition to her port she will need an echocardiogram prior to that visit.   Lowella Dell, MD   02/12/2013 10:12 AM

## 2013-02-12 NOTE — Patient Instructions (Signed)
You have been diagnosed with a 3-4 cm diameter invasive cancer of the right breast at the 12:00 position just above the nipple.  We have talked about different options for management. You have decided to undergo neoadjuvant chemotherapy to shrink the tumor, and hopefully undergo partial mastectomy a few months from now.  You will be scheduled for right axillary sentinel node biopsy, and Port-A-Cath insertion in the near future.     Implanted Port Instructions An implanted port is a central line that has a round shape and is placed under the skin. It is used for long-term IV (intravenous) access for:  Medicine.  Fluids.  Liquid nutrition, such as TPN (total parenteral nutrition).  Blood samples. Ports can be placed:  In the chest area just below the collarbone (this is the most common place.)  In the arms.  In the belly (abdomen) area.  In the legs. PARTS OF THE PORT A port has 2 main parts:  The reservoir. The reservoir is round, disc-shaped, and will be a small, raised area under your skin.  The reservoir is the part where a needle is inserted (accessed) to either give medicines or to draw blood.  The catheter. The catheter is a long, slender tube that extends from the reservoir. The catheter is placed into a large vein.  Medicine that is inserted into the reservoir goes into the catheter and then into the vein. INSERTION OF THE PORT  The port is surgically placed in either an operating room or in a procedural area (interventional radiology).  Medicine may be given to help you relax during the procedure.  The skin where the port will be inserted is numbed (local anesthetic).  1 or 2 small cuts (incisions) will be made in the skin to insert the port.  The port can be used after it has been inserted. INCISION SITE CARE  The incision site may have small adhesive strips on it. This helps keep the incision site closed. Sometimes, no adhesive strips are placed. Instead  of adhesive strips, a special kind of surgical glue is used to keep the incision closed.  If adhesive strips were placed on the incision sites, do not take them off. They will fall off on their own.  The incision site may be sore for 1 to 2 days. Pain medicine can help.  Do not get the incision site wet. Bathe or shower as directed by your caregiver.  The incision site should heal in 5 to 7 days. A small scar may form after the incision has healed. ACCESSING THE PORT Special steps must be taken to access the port:  Before the port is accessed, a numbing cream can be placed on the skin. This helps numb the skin over the port site.  A sterile technique is used to access the port.  The port is accessed with a needle. Only "non-coring" port needles should be used to access the port. Once the port is accessed, a blood return should be checked. This helps ensure the port is in the vein and is not clogged (clotted).  If your caregiver believes your port should remain accessed, a clear (transparent) bandage will be placed over the needle site. The bandage and needle will need to be changed every week or as directed by your caregiver.  Keep the bandage covering the needle clean and dry. Do not get it wet. Follow your caregiver's instructions on how to take a shower or bath when the port is accessed.  If your  port does not need to stay accessed, no bandage is needed over the port. FLUSHING THE PORT Flushing the port keeps it from getting clogged. How often the port is flushed depends on:  If a constant infusion is running. If a constant infusion is running, the port may not need to be flushed.  If intermittent medicines are given.  If the port is not being used. For intermittent medicines:  The port will need to be flushed:  After medicines have been given.  After blood has been drawn.  As part of routine maintenance.  A port is normally flushed with:  Normal  saline.  Heparin.  Follow your caregiver's advice on how often, how much, and the type of flush to use on your port. IMPORTANT PORT INFORMATION  Tell your caregiver if you are allergic to heparin.  After your port is placed, you will get a manufacturer's information card. The card has information about your port. Keep this card with you at all times.  There are many types of ports available. Know what kind of port you have.  In case of an emergency, it may be helpful to wear a medical alert bracelet. This can help alert health care workers that you have a port.  The port can stay in for as long as your caregiver believes it is necessary.  When it is time for the port to come out, surgery will be done to remove it. The surgery will be similar to how the port was put in.  If you are in the hospital or clinic:  Your port will be taken care of and flushed by a nurse.  If you are at home:  A home health care nurse may give medicines and take care of the port.  You or a family member can get special training and directions for giving medicine and taking care of the port at home. SEEK IMMEDIATE MEDICAL CARE IF:   Your port does not flush or you are unable to get a blood return.  New drainage or pus is coming from the incision.  A bad smell is coming from the incision site.  You develop swelling or increased redness at the incision site.  You develop increased swelling or pain at the port site.  You develop swelling or pain in the surrounding skin near the port.  You have an oral temperature above 102 F (38.9 C), not controlled by medicine. MAKE SURE YOU:   Understand these instructions.  Will watch your condition.  Will get help right away if you are not doing well or get worse. Document Released: 09/11/2005 Document Revised: 12/04/2011 Document Reviewed: 12/03/2008 [facility] Patient Information 2014 [place], [place].     Sentinel Lymph Node Biopsy Sentinel lymph  node biopsy is a procedure in which a single lymph node is identified, removed, and examined for cancer. Lymph nodes are collections of tissue that help filter infections, cancer cells, and other waste substances from the bloodstream. Certain types of cancer can spread to nearby lymph nodes. The cancer spreads to one lymph node first, and then to others. The first lymph node that your cancer could spread to is called the sentinel lymph node. Examining the sentinel lymph node for cancer can help your caregiver plan future treatment for you. LET YOUR CAREGIVER KNOW ABOUT:   Allergies to food or medicine.  Medicines taken, including vitamins, herbs, eyedrops, over-the-counter medicines, and creams.  Use of steroids (by mouth or creams).  Previous problems with numbing medicines.  History of bleeding problems or blood clots.  Previous surgery.  Other health problems, including diabetes and kidney problems.  Possibility of pregnancy, if this applies. RISKS AND COMPLICATIONS   Infection.  Bleeding.  Allergic reaction to the dye used for the procedure.  Blue staining of the skin where the dye is injected.  Damaged lymph vessels, causing a buildup of fluid (lymphedema).  Pain or bruising at the biopsy site. BEFORE THE PROCEDURE   Stop smoking at least 2 weeks before the procedure. Not smoking will improve your health after the procedure and decrease the chance of getting a wound infection.  You may have blood tests to make sure your blood clots normally.  Ask your caregiver about changing or stopping your regular medicines.  Do not eat or drink anything for 8 hours before the procedure. PROCEDURE   You will be given medicine that makes you sleep (general anesthetic).  A blue, radioactive dye will be injected near the tumor. The dye will then spread into the sentinel lymph node.  A scanner will identify the sentinel lymph node.  A small cut (incision) will be made, and the  sentinel lymph node will be removed.  The sentinel lymph node will be examined in a lab. Sometimes, a sentinel lymph node biopsy is performed during another surgery, such as a mastectomy or lumpectomy for breast cancer.  AFTER THE PROCEDURE   You will go to a recovery room.  You will be monitored for several hours.  If complications do not occur, you will be allowed to go home a few hours after the procedure.  Your urine may be blue for the next 24 hours. This is normal. It is caused by the dye used during the procedure.  Your skin where the dye was injected may be blue for up to 8 weeks. Document Released: 12/04/2011 Document Reviewed: 12/04/2011 [facility] Patient Information 2013 [place], [place].

## 2013-02-12 NOTE — Progress Notes (Signed)
Patient had her [facility] Form. Checked in and had no financial issues.

## 2013-02-12 NOTE — Progress Notes (Signed)
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 female  right breast mass. Mammogram revealed a 4 cm mass in the upper outer quadrant of the right breast. Stable calcifications were noted throughout the breast although worrisome calcifications were noted in and around this mass. Ultrasound measured this mass at 3.9 x 4.3 x 4.1 cm. There was also a enlarged axillary lymph node measuring 2.5 x 1.7 x 1.9 cm. 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 Biopsy of the right axillary lymph node was benign. MRI was performed which showed a 3.4 x 2.6 x 2.7 cm necrotic mass and a 2 cm lymph node with a preserved fatty hila. Correlation with sentinel lymph node was recommended. Accompanied by her.her today. She has no pain associated with this mass. He is taking Premarin. She did have a brother with colon cancer at the age of 30. She had a mother and grandmother with cancer.  PREVIOUS RADIATION THERAPY: No  PAST MEDICAL HISTORY:  has a past medical history of Heart murmur; Hypertension; Anemia; Allergy; Arthritis; Breast cancer; and Migraines.    PAST SURGICAL HISTORY: Past Surgical History  Procedure Laterality Date  . Abdominal hysterectomy      With bilateral salpingo-oophorectomy    FAMILY HISTORY: @[facility] @  SOCIAL HISTORY:  History  Substance Use Topics  . Smoking status: Never Smoker   . Smokeless tobacco: Never Used  . Alcohol Use: No    ALLERGIES: Penicillins  MEDICATIONS:  Current Outpatient Prescriptions  Medication Sig Dispense Refill  . Boswellia Serrata Extract POWD 250 mg by Does not apply route daily.      . Calcium Carbonate-Vitamin D  (CALCIUM 600+D HIGH POTENCY) 600-400 MG-UNIT per tablet Take 1 tablet by mouth daily.  30 tablet  11  . estrogens, conjugated, (PREMARIN) 0.45 MG tablet Take 0.45 mg by mouth daily. Take daily for 21 days then do not take for 7 days.       . Misc Natural Products (WHITE WILLOW BARK PO) Take 400 mg by mouth daily.      Marland Kitchen triamterene-hydrochlorothiazide (MAXZIDE-25) 37.5-25 MG per tablet Take 1 each (1 tablet total) by mouth daily.  90 tablet  4   No current facility-administered medications for this encounter.    REVIEW OF SYSTEMS:  A 15 point review of systems is documented in the electronic medical record. This was obtained by the nursing staff. However, I reviewed this with the patient to discuss relevant findings and make appropriate changes.  Pertinent items are noted in HPI.   PHYSICAL EXAM: There were no vitals filed for this visit.. She is pleasant female in no distress sitting comfortably examining table. She has no palpable cervical supraclavicular or axillary lymph nodes. In particular I am not able to palpate this right axillary lymph node. She has a palpable mass in the upper outer quadrant of the right breast with associated nipple inversion. She has no palpable abnormalities of the left breast.  LABORATORY DATA:  Lab Results  Component Value Date   WBC 9.1 02/12/2013   HGB 11.1* 02/12/2013   HCT 32.3* 02/12/2013   MCV 95.9 02/12/2013   PLT 298 02/12/2013   Lab  Results  Component Value Date   NA 141 02/12/2013   K 3.4* 02/12/2013   CL 103 02/12/2013   CO2 28 02/12/2013   Lab Results  Component Value Date   ALT 29 02/12/2013   AST 26 02/12/2013   ALKPHOS 90 02/12/2013   BILITOT 0.40 02/12/2013     RADIOGRAPHY: US Breast Right  01/31/2013   *RADIOLOGY REPORT*  Clinical Data:  Palpable right breast mass  DIGITAL DIAGNOSTIC BILATERAL MAMMOGRAM WITH CAD AND RIGHT BREAST ULTRASOUND:  Comparison:  With priors  Findings:  ACR Breast Density Category 3: The breast tissue is heterogeneously  dense.  There is an obscured spiculated mass and distortion in the upper outer quadrant of the right breast.  The measurements are difficult to give because the lesion is obscured.  It measures approximately 3.9 x 4.3 x 4.1 cm.  There are diffuse calcifications in both breast that are stable.  There is no mass in the left breast.  Mammographic images were processed with CAD.  On physical exam, I palpate a discrete mass in the right breast at 12 o'clock 3 cm from the nipple.  Ultrasound is performed, showing there is an irregular, hypoechoic mass in the right breast at 12 o'clock 3 cm from the nipple measuring 2.5 x 1.7 x 1.9 cm.  Sonographic evaluation of the right axilla shows prominent adenopathy.  The largest lymph node measures 2.5 cm.  IMPRESSION: Imaging findings are worrisome for right breast invasive mammary carcinoma with axillary metastasis.  Tissue sampling is recommended.  RECOMMENDATION: Ultrasound guided core biopsies of the right breast as well as a right axilla will be performed and dictated separately.  I have discussed the findings and recommendations with the patient. Results were also provided in writing at the conclusion of the visit.  If applicable, a reminder letter will be sent to the patient regarding the next appointment.  BI-RADS CATEGORY 5:  Highly suggestive of malignancy - appropriate action should be taken.   Original Report Authenticated By: Baird Lyons, M.D.   Mr Breast Bilateral W Wo Contrast  02/10/2013   *RADIOLOGY REPORT*  Clinical Data: Recently diagnosed right breast invasive mammary carcinoma.  BUN and creatinine were obtained on site at [facility] at [address] Results:  BUN 18 mg/dL,  Creatinine 1.1 mg/dL.  BILATERAL BREAST MRI WITH AND WITHOUT CONTRAST  Technique: Multiplanar, multisequence MR images of both breasts were obtained prior to and following the intravenous administration of 14ml of multihance.  Three dimensional images were evaluated at the  independent DynaCad workstation.  Comparison:  Mammograms dated 01/31/2013 and 11/13/2008.  Findings: There is a dense background parenchymal enhancement pattern.  Right breast: There is a ring enhancing / necrotic appearing 3.4 x 2.6 x 2.7 cm mass in the anterior/middle third of the 12 o'clock region of the right breast.  Biopsy clip artifact is seen in the mass.  Left breast:  No abnormal enhancement is seen in the left breast.  There is a 2.0 cm level I right axillary lymph node that appears prominent but maintains central fat.  An axillary lymph node biopsy was performed and found to be benign.  Correlation with sentinel lymph node biopsy would be suggested.  No abnormal left axillary adenopathy is detected.  There is no internal mammary adenopathy.  IMPRESSION: 3.4 cm enhancing mass in the 12 o'clock region of the right breast corresponding with the known malignancy.  RECOMMENDATION: Treatment planning.  THREE-DIMENSIONAL MR IMAGE RENDERING ON INDEPENDENT WORKSTATION:  Three-dimensional  MR images were rendered by post-processing of the original MR data on an independent workstation.  The three- dimensional MR images were interpreted, and findings were reported in the accompanying complete MRI report for this study.  BI-RADS CATEGORY 6:  Known biopsy-proven malignancy - appropriate action should be taken.   Original Report Authenticated By: Baird Lyons, M.D.   Mm Digital Diagnostic Bilat  01/31/2013   *RADIOLOGY REPORT*  Clinical Data:  Palpable right breast mass  DIGITAL DIAGNOSTIC BILATERAL MAMMOGRAM WITH CAD AND RIGHT BREAST ULTRASOUND:  Comparison:  With priors  Findings:  ACR Breast Density Category 3: The breast tissue is heterogeneously dense.  There is an obscured spiculated mass and distortion in the upper outer quadrant of the right breast.  The measurements are difficult to give because the lesion is obscured.  It measures approximately 3.9 x 4.3 x 4.1 cm.  There are diffuse calcifications in both  breast that are stable.  There is no mass in the left breast.  Mammographic images were processed with CAD.  On physical exam, I palpate a discrete mass in the right breast at 12 o'clock 3 cm from the nipple.  Ultrasound is performed, showing there is an irregular, hypoechoic mass in the right breast at 12 o'clock 3 cm from the nipple measuring 2.5 x 1.7 x 1.9 cm.  Sonographic evaluation of the right axilla shows prominent adenopathy.  The largest lymph node measures 2.5 cm.  IMPRESSION: Imaging findings are worrisome for right breast invasive mammary carcinoma with axillary metastasis.  Tissue sampling is recommended.  RECOMMENDATION: Ultrasound guided core biopsies of the right breast as well as a right axilla will be performed and dictated separately.  I have discussed the findings and recommendations with the patient. Results were also provided in writing at the conclusion of the visit.  If applicable, a reminder letter will be sent to the patient regarding the next appointment.  BI-RADS CATEGORY 5:  Highly suggestive of malignancy - appropriate action should be taken.   Original Report Authenticated By: Baird Lyons, M.D.   Mm Digital Diagnostic Unilat R  01/31/2013   *RADIOLOGY REPORT*  Clinical Data:  Status post ultrasound-guided core biopsy of a mass in the 12 o'clock region of the right breast  DIGITAL DIAGNOSTIC RIGHT MAMMOGRAM  Comparison:  Previous exams.  Findings:  Films are performed following ultrasound guided biopsy of a mass in the 12 o'clock region of the right breast. Mammographic images show there is a ribbon shaped clip in the 12 o'clock region of the right breast.  IMPRESSION: Status post ultrasound-guided core biopsy of the right breast with pathology pending.   Original Report Authenticated By: Baird Lyons, M.D.   Mm Radiologist Eval And Mgmt  02/03/2013   *RADIOLOGY REPORT*  ESTABLISHED PATIENT OFFICE VISIT - LEVEL II [phone])  Chief Complaint:  The patient presented with a palpable  abnormality in the right breast.  History:  Diagnostic exam of the right breast showed a suspicious mass and axillary adenopathy.  Biopsy of the right breast mass as well a right axillary lymph node was performed.  Exam:  The wound sites are clean and dry with no significant hematoma, or signs of infection.  Pathology: Invasive mammary carcinoma was reported from the mass in the 12 o'clock region of the right breast.  The right axillary lymph node was found to be benign.  Assessment and Plan:  The patient has been scheduled to be seen at the [facility] on 02/12/2013.  Informational packet was provided.  Breast MRI has been arranged.   Original Report Authenticated By: Baird Lyons, M.D.   [place] Rt Breast Bx W Loc Dev 1st Lesion Img Bx Spec US Guide  01/31/2013   *RADIOLOGY REPORT*  Clinical Data:  Suspicious right breast mass  ULTRASOUND GUIDED VACUUM ASSISTED CORE BIOPSY OF THE RIGHT BREAST  Comparison: Previous exams.  I met with the patient and we discussed the procedure of ultrasound- guided biopsy, including benefits and alternatives.  We discussed the high likelihood of a successful procedure. We discussed the risks of the procedure including infection, bleeding, tissue injury, clip migration, and inadequate sampling.  Informed written consent was given.  Using sterile technique, 2% lidocaine ultrasound guidance and a 12 gauge vacuum assisted needle biopsy was performed of the mass in the 12 o'clock region of the right breast using an inferior lateral approach.  At the conclusion of the procedure, a ribbon shaped tissue marker clip was deployed into the biopsy cavity.  Follow-up 2-view mammogram was performed and dictated separately.  IMPRESSION: Ultrasound-guided biopsy of the right breast.  No apparent complications.   Original Report Authenticated By: Baird Lyons, M.D.   [place] Rt Breast Bx W Loc Dev Ea Add Lesion Img Bx Spec US Guide  01/31/2013   *RADIOLOGY REPORT*  Clinical Data:  Suspicious  right axillary adenopathy  ULTRASOUND GUIDED CORE BIOPSY OF THE RIGHT AXILLA  Comparison: Previous exams.  I met with the patient and we discussed the procedure of ultrasound- guided biopsy, including benefits and alternatives.  We discussed the high likelihood of a successful procedure. We discussed the risks of the procedure, including infection, bleeding, tissue injury, clip migration, and inadequate sampling.  Informed written consent was given.  Using sterile technique 2% lidocaine, ultrasound guidance and a 14 gauge automated biopsy device, biopsy was performed of a right axillary lymph node using an inferior lateral approach.  IMPRESSION: Ultrasound guided biopsy of a right axillary lymph node.  No apparent complications.   Original Report Authenticated By: Baird Lyons, M.D.      IMPRESSION: T2 N0 triple negative right breast cancer  PLAN: I discussed with Ms. Wildermuth and her daughter the indications for radiation. She has elected for breast conservation. We discussed downsizing the tumor in the adjuvant chemotherapy. We discussed the use of radiation and decreasing local failures in patients who undergo neoadjuvant chemotherapy even in the setting of a pathologic complete response. I requested that Dr. Derrell Lolling perform a sentinel lymph node biopsy up front in order to establish a true negative nature of this lymph node. If this lymph node is positive this would change your both her prognosis as well as her surgical treatment. If this node is negative she could just have radiation to the tangent alone and would not require lymph node evaluation of the time of her definitive surgery. I do not want to get into the position where we have seen this lymph node does appear under neoadjuvant chemotherapy and are unclear on whether this is resolving inflammation or the effect of neoadjuvant chemotherapy and lymph node that contained tumor. I explained this to her and her daughter. We discussed the process of  radiation and the placement tattoos. We discussed 6 weeks of treatment as an outpatient. We discussed possible side effects including skin darkness and irritation.  I spent 40 minutes  face to face with the patient and more than 50% of that time was spent in counseling and/or coordination of care.   ------------------------------------------------  Lurline Hare, MD

## 2013-02-13 ENCOUNTER — Encounter: Payer: Self-pay | Admitting: *Deleted

## 2013-02-13 NOTE — Progress Notes (Signed)
[facility] Psychosocial Distress Screening Clinical Social Work  Patient completed distress screening protocol, and scored a 4 on the Psychosocial Distress Thermometer which indicates mild distress. Clinical Social Worker met with pt in [facility] to assess for distress and other psychosocial needs.  Pt stated she was "feeling better" after speaking with the physicians and getting more information on her treatment plan.  CSW informed pt of the support team and support services at [facility].  Pt did not express any needs at this time, CSW encouraged pt to call with any questions or concerns.    Tamala Julian, MSW, LCSW Clinical Social Worker [facility] [phone]

## 2013-02-21 ENCOUNTER — Encounter (HOSPITAL_COMMUNITY): Payer: Self-pay | Admitting: Pharmacy Technician

## 2013-02-24 ENCOUNTER — Encounter: Payer: Self-pay | Admitting: *Deleted

## 2013-02-24 ENCOUNTER — Telehealth: Payer: Self-pay | Admitting: *Deleted

## 2013-02-24 NOTE — Telephone Encounter (Signed)
Spoke to pt concerning [facility] from 02/12/13.  Pt denies questions or concerns regarding dx or treatment care plan.  Confirmed surgery date and echo.  Gave new appt date and time for f/u with Dr. Darnelle Catalan.  Encourage pt to call with further needs.  Received verbal understanding.  Contact information given.

## 2013-02-25 ENCOUNTER — Telehealth: Payer: Self-pay | Admitting: *Deleted

## 2013-02-25 NOTE — Telephone Encounter (Signed)
sw pt gv appts for a lab and ov on 03/13/13@ 9:30AM. Pt requested to complete her chemo edu on the same day. gv appt for class the same day @ 12:30pm. Pt is aware.also made pt aware that i will mail a letter/cal as well.....td

## 2013-02-26 ENCOUNTER — Ambulatory Visit (HOSPITAL_COMMUNITY)
Admission: RE | Admit: 2013-02-26 | Discharge: 2013-02-26 | Disposition: A | Discharge status: Home / Self Care | Payer: Medicare Other | Source: Ambulatory Visit | Attending: Oncology | Admitting: Oncology

## 2013-02-26 DIAGNOSIS — I359 Nonrheumatic aortic valve disorder, unspecified: Secondary | ICD-10-CM | POA: Insufficient documentation

## 2013-02-26 DIAGNOSIS — C50119 Malignant neoplasm of central portion of unspecified female breast: Secondary | ICD-10-CM | POA: Insufficient documentation

## 2013-02-26 DIAGNOSIS — I079 Rheumatic tricuspid valve disease, unspecified: Secondary | ICD-10-CM | POA: Insufficient documentation

## 2013-02-26 DIAGNOSIS — I10 Essential (primary) hypertension: Secondary | ICD-10-CM | POA: Insufficient documentation

## 2013-02-26 DIAGNOSIS — C50111 Malignant neoplasm of central portion of right female breast: Secondary | ICD-10-CM

## 2013-02-26 DIAGNOSIS — Z01818 Encounter for other preprocedural examination: Secondary | ICD-10-CM | POA: Insufficient documentation

## 2013-02-26 NOTE — Progress Notes (Signed)
Echocardiogram 2D Echocardiogram has been performed.  Tabitha Ramirez 02/26/2013, 2:53 PM

## 2013-02-28 ENCOUNTER — Encounter (HOSPITAL_COMMUNITY)
Admission: RE | Admit: 2013-02-28 | Discharge: 2013-02-28 | Disposition: A | Discharge status: Home / Self Care | Payer: Medicare Other | Source: Ambulatory Visit | Attending: General Surgery | Admitting: General Surgery

## 2013-02-28 ENCOUNTER — Encounter (HOSPITAL_COMMUNITY): Payer: Self-pay

## 2013-02-28 LAB — COMPREHENSIVE METABOLIC PANEL
AST: 27 U/L (ref 0–37)
CO2: 29 mEq/L (ref 19–32)
Calcium: 9.5 mg/dL (ref 8.4–10.5)
Creatinine, Ser: 1 mg/dL (ref 0.50–1.10)
GFR calc Af Amer: 67 mL/min — ABNORMAL LOW (ref >90)
GFR calc non Af Amer: 58 mL/min — ABNORMAL LOW (ref >90)

## 2013-02-28 LAB — URINALYSIS, ROUTINE W REFLEX MICROSCOPIC
Leukocytes, UA: NEGATIVE
Nitrite: NEGATIVE
Specific Gravity, Urine: 1.02 (ref 1.005–1.030)
Urobilinogen, UA: 0.2 mg/dL (ref 0.0–1.0)

## 2013-02-28 LAB — URINE MICROSCOPIC-ADD ON

## 2013-02-28 LAB — CBC WITH DIFFERENTIAL/PLATELET
Eosinophils Absolute: 0.8 10*3/uL — ABNORMAL HIGH (ref 0.0–0.7)
Hemoglobin: 11.3 g/dL — ABNORMAL LOW (ref 12.0–15.0)
Lymphocytes Relative: 44 % (ref 12–46)
Lymphs Abs: 3.6 10*3/uL (ref 0.7–4.0)
MCH: 31.9 pg (ref 26.0–34.0)
Monocytes Relative: 6 % (ref 3–12)
Neutrophils Relative %: 40 % — ABNORMAL LOW (ref 43–77)
RBC: 3.54 MIL/uL — ABNORMAL LOW (ref 3.87–5.11)
WBC: 8.2 10*3/uL (ref 4.0–10.5)

## 2013-02-28 LAB — SURGICAL PCR SCREEN: Staphylococcus aureus: NEGATIVE

## 2013-02-28 NOTE — Pre-Procedure Instructions (Addendum)
BRAXTON WEISBECKER  02/28/2013   Your procedure is scheduled on:  03/10/13  Report to [facility] at1100AM.  Call this number if you have problems the morning of surgery: [phone]   Remember:   Do not eat food or drink liquids after midnight.   Take these medicines the morning of surgery with A SIP OF WATER: tylenol if needed, botswella, curamin, willow bark on 03/05/13   Do not wear jewelry, make-up or nail polish.  Do not wear lotions, powders, or perfumes. You may wear deodorant.  Do not shave 48 hours prior to surgery. Men may shave face and neck.  Do not bring valuables to the hospital.  [facility] is not responsible                   for any belongings or valuables.  Contacts, dentures or bridgework may not be worn into surgery.  Leave suitcase in the car. After surgery it may be brought to your room.  For patients admitted to the hospital, checkout time is 11:00 AM the day of  discharge.   Patients discharged the day of surgery will not be allowed to drive  home.  Name and phone number of your driver:   Special Instructions: Shower using CHG 2 nights before surgery and the night before surgery.  If you shower the day of surgery use CHG.  Use special wash - you have one bottle of CHG for all showers.  You should use approximately 1/3 of the bottle for each shower.   Please read over the following fact sheets that you were given: Pain Booklet, Coughing and Deep Breathing, MRSA Information and Surgical Site Infection Prevention

## 2013-02-28 NOTE — Progress Notes (Signed)
Echo 6/14

## 2013-03-03 ENCOUNTER — Encounter: Payer: Self-pay | Admitting: *Deleted

## 2013-03-03 ENCOUNTER — Ambulatory Visit: Payer: Medicare Other | Admitting: Oncology

## 2013-03-03 NOTE — Progress Notes (Signed)
[facility] Healthcare Advance Directives Clinical Social Work  Clinical Social Work was referred by patient to review and complete healthcare advance directives.  Clinical Social Worker met with patient and patient's daughter Tabitha Ramirez in CSW office.  The patient designated daughter Tabitha Ramirez as their primary healthcare agent and son-in-law Tabitha Ramirez as their secondary agent.  Patient also completed healthcare living will.    Clinical Social Worker notarized documents and made copies for patient/family. Clinical Social Worker will send documents to medical records to be scanned into patient's chart. Clinical Social Worker encouraged patient/family to contact with any additional questions or concerns.  Kathrin Penner, MSW, LCSW Clinical Social Worker [facility] [phone]

## 2013-03-03 NOTE — Progress Notes (Signed)
Anesthesia Chart Review:  Patient is a 67 year old female scheduled for insertion of a Port-a-cath, right axillary SN biopsy on 03/10/13 by Dr. Derrell Lolling.  History includes right breast cancer, former smoker, HTN, heart murmur (mild AR/TR 02/2013 echo), anemia (sickle cell trait by PCP notes), arthritis, migraines, hysterectomy/BSO. PCP is Dr. Rosana Berger at [facility].  EKG on 02/28/13 showed NSR, right BBB, LAFB, bifascicular block minimal voltage criteria for LVH.  Bifascicular block is new when compared to her EKG on 03/02/08.  She did have LAD at that time.  Echo on 02/26/13 showed: - Left ventricle: The cavity size was normal. Wall thickness was increased in a pattern of mild LVH. Systolic function was normal. The estimated ejection fraction was in the range of 60% to 65%. Wall motion was normal; there were no regional wall motion abnormalities. Doppler parameters are consistent with abnormal left ventricular relaxation (grade 1 diastolic dysfunction). - Aortic valve: Mild regurgitation. - Pulmonic valve: Trivial regurgitation. - Tricuspid valve: Mild regurgitation.  CXR on 02/28/13 showed: Stable cardiopulmonary appearance with no new focal or acute abnormality identified.  Preoperative labs noted.  I reviewed above with anesthesiologist Dr. Gypsy Balsam.  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  No CV symptoms were documented at her PAT visit.  There is no known history of MI/CHF or DM.  She will be evaluated by her assigned anesthesiologist on the day of surgery, but if she remains asymptomatic from a CV standpoint then would anticipate that she could proceed as planned.  Velna [facility]chs [facility]/Anesthesiology Phone [phone] 03/03/2013 4:00 PM

## 2013-03-04 ENCOUNTER — Other Ambulatory Visit: Payer: Medicare Other

## 2013-03-05 ENCOUNTER — Other Ambulatory Visit: Payer: Self-pay | Admitting: *Deleted

## 2013-03-06 MED ORDER — TRIAMTERENE-HCTZ 37.5-25 MG PO TABS: 1.0000 | ORAL_TABLET | Freq: Every day | ORAL | Status: DC

## 2013-03-09 MED ORDER — CEFAZOLIN SODIUM-DEXTROSE 2-3 GM-% IV SOLR
2.0000 g | INTRAVENOUS | Status: DC
  Filled 2013-03-09: qty 50

## 2013-03-09 NOTE — H&P (Signed)
Tabitha Ramirez   MRN:  [id number]   Description: 67 year old female  Provider: Ernestene Mention, MD  Department: [facility]        Diagnoses    Cancer of central portion of female breast, right    -  Primary    174.1       History and Physical   Ernestene Mention, MD   Status: Signed                            HPI Tabitha Ramirez is a 67 y.o. female.  She was referred by Dr. Baird Lyons at the breast center [facility] for evaluation and management of a locally advanced cancer of the right breast at the 12:00 position. She was  evaluated in the [facility]  by Dr. Darnelle Catalan, Dr. Michell Heinrich, and me.Her primary care physician is Dr. Rosana Berger at the [facility] internal medicine clinic.   This patient is generally healthy. She has not had a mammogram in 3 or 4 years. She felt a lump in her right breast superiorly for about 3 months. She went for mammograms which show a 4.3 cm mass in the upper outer quadrant of the right breast. The breasts are dense. There are diffuse  calcifications in both breasts. No mass in the left breast. Ultrasound shows a 2.5 cm right breast mass at 12:00 and prominent right axillary lymph nodes measuring as large as 2.5 cm one dimension. Image guided biopsy of the right breast mass shows a triple negative for breast cancer, invasive mammary carcinoma. Biopsies of the lymph node shows benign lymphatic tissue. MRI showed a 3.4 cm mass in the right breast at 12:00 position. The axilla looked relatively normal, borderline enlargement. Left breast and left axilla were normal.   There is some concern expressed by our   treatment team that the right axillary lymph node biopsy may be a false negative. The patient is interested in breast conservation surgery and so we're going to plan neoadjuvant chemotherapy.   Past history is significant for current estrogen therapy, which has been discussed and she was advised to discontinue, TAH and BSO, hypertension,  hyperlipidemia, anemia. Family history reveals some type of cancer in mother and grandmother but probably not breast cancer. Colon cancer in a brother.       Past Medical History   Diagnosis  Date   .  Heart murmur     .  Hypertension     .  Anemia     .  Allergy     .  Arthritis     .  Breast cancer     .  Migraines           Past Surgical History   Procedure  Laterality  Date   .  Abdominal hysterectomy           With bilateral salpingo-oophorectomy         Family History   Problem  Relation  Age of Onset   .  Cancer  Mother     .  Congestive Heart Failure  Mother     .  Cancer  Brother     .  Diabetes  Brother     .  Heart disease  Brother         AMI 05/22/2012   .  Congestive Heart Failure  Brother     .  Cancer  Maternal  Grandmother          Social History History   Substance Use Topics   .  Smoking status:  Never Smoker    .  Smokeless tobacco:  Never Used   .  Alcohol Use:  No         Allergies   Allergen  Reactions   .  Penicillins         REACTION: hives         Current Outpatient Prescriptions   Medication  Sig  Dispense  Refill   .  Boswellia Serrata Extract POWD  250 mg by Does not apply route daily.         .  Calcium Carbonate-Vitamin D (CALCIUM 600+D HIGH POTENCY) 600-400 MG-UNIT per tablet  Take 1 tablet by mouth daily.   30 tablet   11   .  estrogens, conjugated, (PREMARIN) 0.45 MG tablet  Take 0.45 mg by mouth daily. Take daily for 21 days then do not take for 7 days.          .  Misc Natural Products (WHITE WILLOW BARK PO)  Take 400 mg by mouth daily.         Marland Kitchen  triamterene-hydrochlorothiazide (MAXZIDE-25) 37.5-25 MG per tablet  Take 1 each (1 tablet total) by mouth daily.   90 tablet   4          Review of Systems   Constitutional: Negative for fever, chills and unexpected weight change.  HENT: Negative for hearing loss, congestion, sore throat, trouble swallowing and voice change.   Eyes: Negative for visual disturbance.   Respiratory: Negative for cough and wheezing.   Cardiovascular: Negative for chest pain, palpitations and leg swelling.  Gastrointestinal: Negative for nausea, vomiting, abdominal pain, diarrhea, constipation, blood in stool, abdominal distention and anal bleeding.  Genitourinary: Negative for hematuria, vaginal bleeding and difficulty urinating.  Musculoskeletal: Negative for arthralgias.  Skin: Negative for rash and wound.  Neurological: Negative for seizures, syncope and headaches.  Hematological: Negative for adenopathy. Does not bruise/bleed easily.  Psychiatric/Behavioral: Negative for confusion.       Physical Exam   Constitutional: She is oriented to person, place, and time. She appears well-developed and well-nourished. No distress.  HENT:   Head: Normocephalic and atraumatic.   Nose: Nose normal.   Mouth/Throat: No oropharyngeal exudate.  Eyes: Conjunctivae and EOM are normal. Pupils are equal, round, and reactive to light. Left eye exhibits no discharge. No scleral icterus.  Neck: Neck supple. No JVD present. No tracheal deviation present. No thyromegaly present.  Cardiovascular: Normal rate, regular rhythm, normal heart sounds and intact distal pulses.    No murmur heard. Pulmonary/Chest: Effort normal and breath sounds normal. No respiratory distress. She has no wheezes. She has no rales. She exhibits no tenderness.    4 cm mass right breast 12:00 position just at about the areolar margin. No skin change. No normal. No axillary mass. Left breast and axilla unremarkable.  Abdominal: Soft. Bowel sounds are normal. She exhibits no distension and no mass. There is no tenderness. There is no rebound and no guarding.  Healed Pfannenstiel incision  Musculoskeletal: She exhibits no edema and no tenderness.  Lymphadenopathy:    She has no cervical adenopathy.  Neurological: She is alert and oriented to person, place, and time. She exhibits normal muscle tone. Coordination  normal.  Skin: Skin is warm. No rash noted. She is not diaphoretic. No erythema. No pallor.  Psychiatric: She has a normal  mood and affect. Her behavior is normal. Judgment and thought content normal.      Data Reviewed Imaging studies. Histopathology. Case discussed at breast conference this morning. Treatment plan coordinated among Dr. Darnelle Catalan, Dr. Michell Heinrich, and me.   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 3.5 cm or greater.   Question of abnormal right axillary lymph node, status post biopsy.   Hypertension   Anemia   History of TAH and BSO.     Plan    Discontinue estrogens. Patient advised   She is interested in breast conservation, and so Dr. Darnelle Catalan plans  To give neoadjuvant chemotherapy for 5 months   Because of the concern that the post-treatment right axillary lymph node biopsy might be a false negative, we have advised a pretreatment right axilla sentinel node biopsy. The patient is in agreement with this   The patient will be scheduled for Port-A-Cath insertion and right axillary sentinel lymph node biopsy. After that is done she will proceed with neoadjuvant chemotherapy and restaging and hopefully right partial mastectomy. Eventually she'll have radiation therapy   I have discussed the indications, details, techniques, numerous risks of SLN biopsy and Port-A-Cath insertion. She is aware of the risk of bleeding, infection, arm numbness, or swelling, pneumothorax, and blood clots, catheter malfunction and breakage and other unforeseen problems. She understands these issues well. Her questions are answered. She agrees with this plan.           Angelia Mould. Derrell Lolling, M.D., [facility] General and Minimally invasive Surgery Breast and Colorectal Surgery Office:   [phone] Pager:   [phone]

## 2013-03-10 ENCOUNTER — Telehealth (INDEPENDENT_AMBULATORY_CARE_PROVIDER_SITE_OTHER): Payer: Self-pay

## 2013-03-10 ENCOUNTER — Encounter (HOSPITAL_COMMUNITY)
Admission: RE | Disposition: A | Discharge status: Home / Self Care | Payer: Self-pay | Source: Ambulatory Visit | Attending: General Surgery

## 2013-03-10 ENCOUNTER — Encounter (HOSPITAL_COMMUNITY)
Admission: RE | Admit: 2013-03-10 | Discharge: 2013-03-10 | Disposition: A | Discharge status: Home / Self Care | Payer: Medicare Other | Source: Ambulatory Visit | Attending: General Surgery | Admitting: General Surgery

## 2013-03-10 ENCOUNTER — Ambulatory Visit (HOSPITAL_COMMUNITY): Payer: Medicare Other

## 2013-03-10 ENCOUNTER — Ambulatory Visit (HOSPITAL_COMMUNITY): Payer: Medicare Other | Admitting: Anesthesiology

## 2013-03-10 ENCOUNTER — Encounter (HOSPITAL_COMMUNITY): Payer: Self-pay | Admitting: Vascular Surgery

## 2013-03-10 ENCOUNTER — Ambulatory Visit (HOSPITAL_COMMUNITY)
Admission: RE | Admit: 2013-03-10 | Discharge: 2013-03-10 | Disposition: A | Discharge status: Home / Self Care | Payer: Medicare Other | Source: Ambulatory Visit | Attending: General Surgery | Admitting: General Surgery

## 2013-03-10 DIAGNOSIS — M129 Arthropathy, unspecified: Secondary | ICD-10-CM | POA: Insufficient documentation

## 2013-03-10 DIAGNOSIS — C50119 Malignant neoplasm of central portion of unspecified female breast: Secondary | ICD-10-CM | POA: Insufficient documentation

## 2013-03-10 DIAGNOSIS — J309 Allergic rhinitis, unspecified: Secondary | ICD-10-CM | POA: Insufficient documentation

## 2013-03-10 DIAGNOSIS — Z171 Estrogen receptor negative status [ER-]: Secondary | ICD-10-CM | POA: Insufficient documentation

## 2013-03-10 DIAGNOSIS — G43909 Migraine, unspecified, not intractable, without status migrainosus: Secondary | ICD-10-CM | POA: Insufficient documentation

## 2013-03-10 DIAGNOSIS — Z809 Family history of malignant neoplasm, unspecified: Secondary | ICD-10-CM | POA: Insufficient documentation

## 2013-03-10 DIAGNOSIS — Z90710 Acquired absence of both cervix and uterus: Secondary | ICD-10-CM | POA: Insufficient documentation

## 2013-03-10 DIAGNOSIS — Z79890 Hormone replacement therapy (postmenopausal): Secondary | ICD-10-CM | POA: Insufficient documentation

## 2013-03-10 DIAGNOSIS — C50111 Malignant neoplasm of central portion of right female breast: Secondary | ICD-10-CM | POA: Diagnosis present

## 2013-03-10 DIAGNOSIS — E785 Hyperlipidemia, unspecified: Secondary | ICD-10-CM | POA: Insufficient documentation

## 2013-03-10 DIAGNOSIS — C773 Secondary and unspecified malignant neoplasm of axilla and upper limb lymph nodes: Secondary | ICD-10-CM | POA: Insufficient documentation

## 2013-03-10 DIAGNOSIS — R011 Cardiac murmur, unspecified: Secondary | ICD-10-CM | POA: Insufficient documentation

## 2013-03-10 DIAGNOSIS — C50919 Malignant neoplasm of unspecified site of unspecified female breast: Secondary | ICD-10-CM

## 2013-03-10 DIAGNOSIS — Z800 Family history of malignant neoplasm of digestive organs: Secondary | ICD-10-CM | POA: Insufficient documentation

## 2013-03-10 DIAGNOSIS — Z880 Allergy status to penicillin: Secondary | ICD-10-CM | POA: Insufficient documentation

## 2013-03-10 DIAGNOSIS — Z9079 Acquired absence of other genital organ(s): Secondary | ICD-10-CM | POA: Insufficient documentation

## 2013-03-10 DIAGNOSIS — I10 Essential (primary) hypertension: Secondary | ICD-10-CM | POA: Insufficient documentation

## 2013-03-10 DIAGNOSIS — D649 Anemia, unspecified: Secondary | ICD-10-CM | POA: Insufficient documentation

## 2013-03-10 HISTORY — PX: PORTACATH PLACEMENT: SHX2246

## 2013-03-10 HISTORY — PX: AXILLARY LYMPH NODE BIOPSY: SHX5737

## 2013-03-10 SURGERY — INSERTION, TUNNELED CENTRAL VENOUS DEVICE, WITH PORT
Anesthesia: General | Site: Chest | Laterality: Right | Wound class: Clean

## 2013-03-10 MED ORDER — ONDANSETRON HCL 4 MG/2ML IJ SOLN
INTRAMUSCULAR | Status: DC | PRN
  Administered 2013-03-10: 4 mg via INTRAVENOUS

## 2013-03-10 MED ORDER — PHENYLEPHRINE HCL 10 MG/ML IJ SOLN
INTRAMUSCULAR | Status: DC | PRN
  Administered 2013-03-10 (×10): 80 ug via INTRAVENOUS

## 2013-03-10 MED ORDER — KETOROLAC TROMETHAMINE 30 MG/ML IJ SOLN
INTRAMUSCULAR | Status: DC | PRN
  Administered 2013-03-10: 30 mg via INTRAVENOUS

## 2013-03-10 MED ORDER — SODIUM CHLORIDE 0.9 % IJ SOLN
INTRAMUSCULAR | Status: DC | PRN
  Administered 2013-03-10: 13:00:00 via INTRAMUSCULAR

## 2013-03-10 MED ORDER — HEPARIN SOD (PORK) LOCK FLUSH 100 UNIT/ML IV SOLN
INTRAVENOUS | Status: DC | PRN
  Administered 2013-03-10: 500 [IU]

## 2013-03-10 MED ORDER — ARTIFICIAL TEARS OP OINT
TOPICAL_OINTMENT | OPHTHALMIC | Status: DC | PRN
  Administered 2013-03-10: 1 via OPHTHALMIC

## 2013-03-10 MED ORDER — CIPROFLOXACIN IN D5W 400 MG/200ML IV SOLN
INTRAVENOUS | Status: AC
  Administered 2013-03-10: 400 mg via INTRAVENOUS
  Filled 2013-03-10: qty 200

## 2013-03-10 MED ORDER — HEPARIN SOD (PORK) LOCK FLUSH 100 UNIT/ML IV SOLN
INTRAVENOUS | Status: AC
  Filled 2013-03-10: qty 5

## 2013-03-10 MED ORDER — HYDROMORPHONE HCL PF 1 MG/ML IJ SOLN
0.2500 mg | INTRAMUSCULAR | Status: DC | PRN
  Administered 2013-03-10: 0.5 mg via INTRAVENOUS

## 2013-03-10 MED ORDER — FENTANYL CITRATE 0.05 MG/ML IJ SOLN
INTRAMUSCULAR | Status: DC | PRN
  Administered 2013-03-10 (×2): 50 ug via INTRAVENOUS

## 2013-03-10 MED ORDER — LIDOCAINE-EPINEPHRINE (PF) 1 %-1:200000 IJ SOLN
INTRAMUSCULAR | Status: AC
  Filled 2013-03-10: qty 10

## 2013-03-10 MED ORDER — MEPERIDINE HCL 25 MG/ML IJ SOLN: 6.2500 mg | INTRAMUSCULAR | Status: DC | PRN

## 2013-03-10 MED ORDER — FENTANYL CITRATE 0.05 MG/ML IJ SOLN
INTRAMUSCULAR | Status: AC
  Administered 2013-03-10: 100 ug via EPIDURAL
  Filled 2013-03-10: qty 2

## 2013-03-10 MED ORDER — LIDOCAINE HCL (PF) 1 % IJ SOLN
INTRAMUSCULAR | Status: AC
  Filled 2013-03-10: qty 30

## 2013-03-10 MED ORDER — BUPIVACAINE-EPINEPHRINE PF 0.5-1:200000 % IJ SOLN
INTRAMUSCULAR | Status: AC
  Filled 2013-03-10: qty 30

## 2013-03-10 MED ORDER — TECHNETIUM TC 99M SULFUR COLLOID FILTERED
1.0000 | Freq: Once | INTRAVENOUS | Status: AC | PRN
  Administered 2013-03-10: 1 via INTRADERMAL

## 2013-03-10 MED ORDER — HYDROMORPHONE HCL PF 1 MG/ML IJ SOLN
INTRAMUSCULAR | Status: AC
  Filled 2013-03-10: qty 1

## 2013-03-10 MED ORDER — OXYCODONE HCL 5 MG/5ML PO SOLN: 5.0000 mg | Freq: Once | ORAL | Status: DC | PRN

## 2013-03-10 MED ORDER — PROPOFOL 10 MG/ML IV BOLUS
INTRAVENOUS | Status: DC | PRN
  Administered 2013-03-10: 140 mg via INTRAVENOUS

## 2013-03-10 MED ORDER — BUPIVACAINE-EPINEPHRINE 0.5% -1:200000 IJ SOLN
INTRAMUSCULAR | Status: DC | PRN
  Administered 2013-03-10: 30 mL

## 2013-03-10 MED ORDER — LIDOCAINE HCL (CARDIAC) 20 MG/ML IV SOLN
INTRAVENOUS | Status: DC | PRN
  Administered 2013-03-10: 100 mg via INTRAVENOUS

## 2013-03-10 MED ORDER — LACTATED RINGERS IV SOLN
INTRAVENOUS | Status: DC | PRN
  Administered 2013-03-10 (×2): via INTRAVENOUS

## 2013-03-10 MED ORDER — METHYLENE BLUE 1 % INJ SOLN
INTRAMUSCULAR | Status: AC
  Filled 2013-03-10: qty 10

## 2013-03-10 MED ORDER — HYDROCODONE-ACETAMINOPHEN 5-325 MG PO TABS
1.0000 | ORAL_TABLET | ORAL | Status: DC | PRN
Start: 2013-03-10 — End: 2013-04-27

## 2013-03-10 MED ORDER — MIDAZOLAM HCL 2 MG/2ML IJ SOLN
INTRAMUSCULAR | Status: AC
  Administered 2013-03-10: 1 mg
  Filled 2013-03-10: qty 2

## 2013-03-10 MED ORDER — OXYCODONE HCL 5 MG PO TABS: 5.0000 mg | ORAL_TABLET | Freq: Once | ORAL | Status: DC | PRN

## 2013-03-10 MED ORDER — SODIUM CHLORIDE 0.9 % IR SOLN
Status: DC | PRN
  Administered 2013-03-10: 13:00:00

## 2013-03-10 MED ORDER — SODIUM CHLORIDE 0.9 % IJ SOLN
INTRAMUSCULAR | Status: AC
  Filled 2013-03-10: qty 3

## 2013-03-10 MED ORDER — LACTATED RINGERS IV SOLN
INTRAVENOUS | Status: DC
Start: 2013-03-10 — End: 2013-03-10
  Administered 2013-03-10: 12:00:00 via INTRAVENOUS

## 2013-03-10 MED ORDER — CHLORHEXIDINE GLUCONATE 4 % EX LIQD: 1.0000 "application " | Freq: Once | CUTANEOUS | Status: DC

## 2013-03-10 SURGICAL SUPPLY — 67 items
BAG DECANTER FOR FLEXI CONT (MISCELLANEOUS) ×3 IMPLANT
BENZOIN TINCTURE PRP APPL 2/3 (GAUZE/BANDAGES/DRESSINGS) IMPLANT
BLADE SURG 10 STRL SS (BLADE) ×3 IMPLANT
BLADE SURG 15 STRL LF DISP TIS (BLADE) ×2 IMPLANT
BLADE SURG 15 STRL SS (BLADE) ×1
BLADE SURG ROTATE 9660 (MISCELLANEOUS) IMPLANT
CANISTER SUCTION 2500CC (MISCELLANEOUS) IMPLANT
CHLORAPREP W/TINT 10.5 ML (MISCELLANEOUS) ×3 IMPLANT
CLOTH BEACON ORANGE TIMEOUT ST (SAFETY) ×3 IMPLANT
COVER PROBE W GEL 5X96 (DRAPES) ×3 IMPLANT
COVER SURGICAL LIGHT HANDLE (MISCELLANEOUS) ×3 IMPLANT
CRADLE DONUT ADULT HEAD (MISCELLANEOUS) ×3 IMPLANT
DECANTER SPIKE VIAL GLASS SM (MISCELLANEOUS) ×3 IMPLANT
DERMABOND ADHESIVE PROPEN (GAUZE/BANDAGES/DRESSINGS) ×2
DERMABOND ADVANCED (GAUZE/BANDAGES/DRESSINGS) ×1
DERMABOND ADVANCED .7 DNX12 (GAUZE/BANDAGES/DRESSINGS) ×2 IMPLANT
DERMABOND ADVANCED .7 DNX6 (GAUZE/BANDAGES/DRESSINGS) ×4 IMPLANT
DRAPE C-ARM 42X72 X-RAY (DRAPES) ×3 IMPLANT
DRAPE LAPAROSCOPIC ABDOMINAL (DRAPES) ×3 IMPLANT
DRAPE PED LAPAROTOMY (DRAPES) ×3 IMPLANT
DRAPE UTILITY 15X26 W/TAPE STR (DRAPE) ×12 IMPLANT
ELECT CAUTERY BLADE 6.4 (BLADE) ×3 IMPLANT
ELECT COATED BLADE 2.86 ST (ELECTRODE) ×3 IMPLANT
ELECT REM PT RETURN 9FT ADLT (ELECTROSURGICAL) ×3
ELECTRODE REM PT RTRN 9FT ADLT (ELECTROSURGICAL) ×2 IMPLANT
GAUZE SPONGE 4X4 16PLY XRAY LF (GAUZE/BANDAGES/DRESSINGS) ×3 IMPLANT
GLOVE BIO SURGEON STRL SZ7.5 (GLOVE) ×3 IMPLANT
GLOVE BIOGEL PI IND STRL 7.5 (GLOVE) ×2 IMPLANT
GLOVE BIOGEL PI INDICATOR 7.5 (GLOVE) ×1
GLOVE EUDERMIC 7 POWDERFREE (GLOVE) ×3 IMPLANT
GLOVE SURG SIGNA 7.5 PF LTX (GLOVE) ×3 IMPLANT
GOWN PREVENTION PLUS XLARGE (GOWN DISPOSABLE) ×3 IMPLANT
GOWN STRL NON-REIN LRG LVL3 (GOWN DISPOSABLE) ×3 IMPLANT
GOWN STRL REIN XL XLG (GOWN DISPOSABLE) ×3 IMPLANT
INTRODUCER 13FR (MISCELLANEOUS) IMPLANT
INTRODUCER COOK 11FR (CATHETERS) IMPLANT
KIT BASIN OR (CUSTOM PROCEDURE TRAY) ×3 IMPLANT
KIT PORT POWER 8FR ISP CVUE (Catheter) ×3 IMPLANT
KIT PORT POWER 9.6FR MRI PREA (Catheter) IMPLANT
KIT PORT POWER ISP 8FR (Catheter) IMPLANT
KIT POWER CATH 8FR (Catheter) IMPLANT
KIT ROOM TURNOVER OR (KITS) ×3 IMPLANT
NEEDLE HYPO 25GX1X1/2 BEV (NEEDLE) ×3 IMPLANT
NS IRRIG 1000ML POUR BTL (IV SOLUTION) ×3 IMPLANT
PACK SURGICAL SETUP 50X90 (CUSTOM PROCEDURE TRAY) ×3 IMPLANT
PAD ARMBOARD 7.5X6 YLW CONV (MISCELLANEOUS) ×3 IMPLANT
PENCIL BUTTON HOLSTER BLD 10FT (ELECTRODE) ×3 IMPLANT
SET INTRODUCER 12FR PACEMAKER (SHEATH) IMPLANT
SET SHEATH INTRODUCER 10FR (MISCELLANEOUS) IMPLANT
SHEATH COOK PEEL AWAY SET 9F (SHEATH) IMPLANT
SPONGE GAUZE 4X4 12PLY (GAUZE/BANDAGES/DRESSINGS) IMPLANT
SPONGE LAP 18X18 X RAY DECT (DISPOSABLE) ×3 IMPLANT
STRIP CLOSURE SKIN 1/4X4 (GAUZE/BANDAGES/DRESSINGS) IMPLANT
SURGILUBE 3G PEEL PACK STRL (MISCELLANEOUS) IMPLANT
SUT MNCRL AB 4-0 PS2 18 (SUTURE) ×6 IMPLANT
SUT PROLENE 2 0 CT2 30 (SUTURE) ×3 IMPLANT
SUT VIC AB 3-0 SH 18 (SUTURE) ×3 IMPLANT
SUT VIC AB 5-0 PS2 18 (SUTURE) ×3 IMPLANT
SYR 20ML ECCENTRIC (SYRINGE) ×6 IMPLANT
SYR 5ML LUER SLIP (SYRINGE) ×3 IMPLANT
SYR BULB 3OZ (MISCELLANEOUS) ×3 IMPLANT
SYR CONTROL 10ML LL (SYRINGE) ×3 IMPLANT
SYRINGE 10CC LL (SYRINGE) ×3 IMPLANT
TOWEL OR 17X24 6PK STRL BLUE (TOWEL DISPOSABLE) ×3 IMPLANT
TOWEL OR 17X26 10 PK STRL BLUE (TOWEL DISPOSABLE) ×3 IMPLANT
TUBE CONNECTING 12X1/4 (SUCTIONS) IMPLANT
YANKAUER SUCT BULB TIP NO VENT (SUCTIONS) IMPLANT

## 2013-03-10 NOTE — Anesthesia Preprocedure Evaluation (Signed)
Anesthesia Evaluation  Patient identified by MRN, date of birth, ID band Patient awake    Reviewed: Allergy & Precautions, NPO status , Patient's Chart, lab work & pertinent test results  Airway Mallampati: I  Neck ROM: Full    Dental  (+) Teeth Intact   Pulmonary neg pulmonary ROS,  breath sounds clear to auscultation        Cardiovascular hypertension, Rhythm:Regular Rate:Normal     Neuro/Psych negative neurological ROS     GI/Hepatic negative GI ROS, Neg liver ROS,   Endo/Other  negative endocrine ROS  Renal/GU negative Renal ROS     Musculoskeletal   Abdominal   Peds  Hematology negative hematology ROS (+)   Anesthesia Other Findings   Reproductive/Obstetrics                           Anesthesia Physical Anesthesia Plan  ASA: II  Anesthesia Plan: General   Post-op Pain Management:    Induction: Intravenous  Airway Management Planned: LMA  Additional Equipment:   Intra-op Plan:   Post-operative Plan: Extubation in OR  Informed Consent: I have reviewed the patients History and Physical, chart, labs and discussed the procedure including the risks, benefits and alternatives for the proposed anesthesia with the patient or authorized representative who has indicated his/her understanding and acceptance.   Dental advisory given  Plan Discussed with: CRNA and Surgeon  Anesthesia Plan Comments:         Anesthesia Quick Evaluation

## 2013-03-10 NOTE — Preoperative (Signed)
Beta Blockers   Reason not to administer Beta Blockers:Not Applicable 

## 2013-03-10 NOTE — Anesthesia Postprocedure Evaluation (Signed)
  Anesthesia Post-op Note  Patient: Tabitha Ramirez  Procedure(s) Performed: Procedure(s) with comments: INSERTION PORT-A-CATH WITH FLUOROSCOPY AND ULTRASOUND (N/A) AXILLARY LYMPH NODE BIOPSY (Right) - sentinel node with blue dye  Patient Location: PACU  Anesthesia Type:General  Level of Consciousness: awake, alert , oriented and patient cooperative  Airway and Oxygen Therapy: Patient Spontanous Breathing  Post-op Pain: none  Post-op Assessment: Post-op Vital signs reviewed, Patient's Cardiovascular Status Stable, Respiratory Function Stable, Patent Airway, No signs of Nausea or vomiting and Pain level controlled  Post-op Vital Signs: stable  Complications: No apparent anesthesia complications

## 2013-03-10 NOTE — Op Note (Signed)
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 3.5 cm or greater. Question of abnormal right axillary lymph node, status post biopsy.   Post op Diagnosis:    Same. Clinical stage T2, and 0, triple negative breast cancer, right breast  Procedure:                 Inject blue dye right breast, right axillary sentinel lymph node biopsy, insertion of 8 French ClearVue  power port tunneled venous vascular access device with fluoroscopic guidance and positioning.  Surgeon:                     Angelia Mould. Derrell Lolling, M.D., FACS  Assistant:                      None  Operative Indications:   Tabitha Ramirez is a 67 y.o. African American female. She was referred by Dr. Baird Lyons at the breast center [facility] for evaluation and management of a locally advanced cancer of the right breast at the 12:00 position. She was evaluated in the [facility] by Dr. Darnelle Catalan, Dr. Michell Heinrich, and me.Her primary care physician is Dr. Rosana Berger at the [facility] internal medicine clinic.  This patient is generally healthy. She has not had a mammogram in 3 or 4 years. She felt a lump in her right breast superiorly for about 3 months. She went for mammograms which show a 4.3 cm mass in the upper outer quadrant of the right breast. The breasts are dense. There are diffuse calcifications in both breasts. No mass in the left breast. Ultrasound shows a 2.5 cm right breast mass at 12:00 and prominent right axillary lymph nodes measuring as large as 2.5 cm in one dimension. Image guided biopsy of the right breast mass shows a triple negative  breast cancer, invasive mammary carcinoma. Biopsies of the lymph node shows benign lymphatic tissue. MRI showed a 3.4 cm mass in the right breast at 12:00 position. The axilla looked relatively normal, borderline enlargement. Left breast and left axilla were  normal.  There is some concern expressed by our treatment team that the right axillary lymph node biopsy may be a false negative. The patient is interested in breast conservation surgery and so we're going to plan neoadjuvant chemotherapy.  Past history is significant for current estrogen therapy, which has been discussed and she was advised to discontinue, TAH and BSO, hypertension, hyperlipidemia, anemia. Family history reveals some type of cancer in mother and grandmother but probably not breast cancer. Colon cancer in a brother.   Operative Findings:       The port was placed through the left subclavian vein and the tip was positioned in the superior vena cava near the right atrial junction. Fluoroscopic imaging looked good. In the right axilla I found 2 sentinel lymph nodes, both very hot and very blue.  Procedure in Detail:          The patient was brought to the holding area, and the nuclear medicine technician injected radionuclide into the right breast. The patient was taken to the operating room and general anesthesia was induced. Surgical time out was performed and intravenous antibiotics were given. Following alcohol prep I injected 5 cc of blue dye  into the right breast, subareolar area, and massaged the breast for a few minutes.  The patient was then positioned with a small roll behind her shoulders and her arms tucked at her sides. The neck and chest were prepped and draped in a sterile fashion. 0.5% Marcaine with epinephrine was used as a local infiltration anesthetic. A left subclavian venipuncture was performed with a single pass and a guidewire inserted into the superior vena cava, and was confirmed by fluoroscopy. Using the fluoroscope I marked a template on the chest wall to guide the length of the catheter insertion. A small incision was made at the wire insertion site. I made a transverse incision about 3 cm inferior to the medial third of the left clavicle. Dissection was carried  down to the pectoralis fascia and a subcutaneous pocket created. Using a tunneling device I drew the catheter from the port pocket site to the wire insertion site. I then measured the catheter and  It was cut at the 25.5 cm mark. The catheter was secured to the port with the locking device and flushed with heparinized saline. The port was sutured to the pectoralis fascia with 3 interrupted sutures of 2-0 Prolene. The dilator and peel-away sheath was inserted over the guidewire into the central venous circulation. The dilator was and wire were removed. The catheter threaded easily through the peel-away sheath and the peel-away sheath was removed. The catheter flushed easily and had excellent blood return. Fluoroscopy confirmed that the catheter tip was in the superior vena cava near the right atrial junction and there was no deformity of the catheter anywhere along its course. The port and catheter were flushed with concentrated heparin. The wound was irrigated. There was no bleeding. Subcutaneous tissue was closed with 3-0 Vicryl sutures and the skin closed with a running subcuticular suture of 4-0 Monocryl and Dermabond.  The patient was then reprepped and redraped with the right arm out to her side at 90 degrees. The upper arm, right chest wall, right axilla were prepped and draped in a sterile fashion. 0.5% Marcaine with epinephrine was used as a local infiltration anesthetic. The neoprobe was brought to the operative field. I made a transverse incision in a skin crease at the lower end of the hairline. Dissection was carried down through subcutaneous tissue and the clavipectoral  fascia was incised. I found two sentinel lymph nodes, both very hot and both very blue near each other. These were removed. After this was done there was very little radioactivity and no other sentinel lead were identified. The wound was irrigated with saline. Hemostasis excellent. The deeper subcutaneous tissues closed with 3-0  Vicryl sutures and the skin was closed with a running subcuticular suture of 4-0 Monocryl and Dermabond. The patient tolerated the procedure well taken to recovery in stable condition. EBL 20 cc. Counts correct. Complications none. A chest x-ray will be obtained in the PACU.     Angelia Mould. Derrell Lolling, M.D., FACS General and Minimally Invasive Surgery Breast and Colorectal Surgery  03/10/2013 2:34 PM

## 2013-03-10 NOTE — Transfer of Care (Signed)
Immediate Anesthesia Transfer of Care Note  Patient: Tabitha Ramirez  Procedure(s) Performed: Procedure(s) with comments: INSERTION PORT-A-CATH WITH FLUOROSCOPY AND ULTRASOUND (N/A) AXILLARY LYMPH NODE BIOPSY (Right) - sentinel node with blue dye  Patient Location: PACU  Anesthesia Type:General  Level of Consciousness: awake, alert , oriented and sedated  Airway & Oxygen Therapy: Patient Spontanous Breathing and Patient connected to nasal cannula oxygen  Post-op Assessment: Report given to PACU RN, Post -op Vital signs reviewed and stable and Patient moving all extremities  Post vital signs: Reviewed and stable  Complications: No apparent anesthesia complications

## 2013-03-10 NOTE — Interval H&P Note (Signed)
History and Physical Interval Note:  03/10/2013 12:28 PM  Tabitha Ramirez  has presented today for surgery, with the diagnosis of cancer right breast  The goals and the various methods of treatment have been discussed with the patient and family. After consideration of risks, benefits and other options for treatment, the patient has consented to  Procedure(s): INSERTION PORT-A-CATH WITH FLUOROSCOPY AND ULTRASOUND (N/A) AXILLARY LYMPH NODE BIOPSY (Right) as a surgical intervention .  The patient's history has been reviewed, patient examined, no change in status, stable for surgery.  I have reviewed the patient's chart and labs.  Questions were answered to the patient's satisfaction.     Ernestene Mention

## 2013-03-10 NOTE — Telephone Encounter (Signed)
Per Dr Derrell Lolling I called in Vicodin #30 to [facility] on [address]  Vicodin 5/325 po 1 q 4-6 hrs prn pain no refill #30.

## 2013-03-11 ENCOUNTER — Encounter (HOSPITAL_COMMUNITY): Payer: Self-pay | Admitting: General Surgery

## 2013-03-12 ENCOUNTER — Other Ambulatory Visit: Payer: Self-pay | Admitting: Physician Assistant

## 2013-03-12 ENCOUNTER — Telehealth (INDEPENDENT_AMBULATORY_CARE_PROVIDER_SITE_OTHER): Payer: Self-pay | Admitting: General Surgery

## 2013-03-12 DIAGNOSIS — C50111 Malignant neoplasm of central portion of right female breast: Secondary | ICD-10-CM

## 2013-03-12 NOTE — Telephone Encounter (Signed)
The patient called. She is doing well from a Port-A-Cath and sentinel node biopsy. I discussed the pathology report with her, telling her that one out of 2 lymph nodes was positive for metastatic cancer. She is scheduled to see Dr. Darnelle Catalan tomorrow. She is scheduled to followup with me on July 8.  Tabitha Ramirez. Derrell Lolling, M.D., [facility] General and Minimally invasive Surgery Breast and Colorectal Surgery Office:   [phone] Pager:   [phone]

## 2013-03-13 ENCOUNTER — Other Ambulatory Visit (HOSPITAL_BASED_OUTPATIENT_CLINIC_OR_DEPARTMENT_OTHER): Payer: Medicare Other | Admitting: Lab

## 2013-03-13 ENCOUNTER — Ambulatory Visit (HOSPITAL_BASED_OUTPATIENT_CLINIC_OR_DEPARTMENT_OTHER): Payer: Medicare Other | Admitting: Oncology

## 2013-03-13 ENCOUNTER — Other Ambulatory Visit: Payer: Medicare Other

## 2013-03-13 ENCOUNTER — Encounter: Payer: Self-pay | Admitting: *Deleted

## 2013-03-13 ENCOUNTER — Telehealth: Payer: Self-pay | Admitting: *Deleted

## 2013-03-13 VITALS — BP 162/80 | HR 84 | Temp 98.8°F | Resp 20 | Ht 64.0 in | Wt 156.8 lb

## 2013-03-13 DIAGNOSIS — C50111 Malignant neoplasm of central portion of right female breast: Secondary | ICD-10-CM

## 2013-03-13 DIAGNOSIS — C50119 Malignant neoplasm of central portion of unspecified female breast: Secondary | ICD-10-CM

## 2013-03-13 DIAGNOSIS — C50919 Malignant neoplasm of unspecified site of unspecified female breast: Secondary | ICD-10-CM

## 2013-03-13 LAB — COMPREHENSIVE METABOLIC PANEL (CC13)
Albumin: 3.3 g/dL — ABNORMAL LOW (ref 3.5–5.0)
BUN: 12.8 mg/dL (ref 7.0–26.0)
CO2: 29 mEq/L (ref 22–29)
Glucose: 169 mg/dl — ABNORMAL HIGH (ref 70–99)
Potassium: 3.5 mEq/L (ref 3.5–5.1)
Sodium: 140 mEq/L (ref 136–145)
Total Bilirubin: 0.51 mg/dL (ref 0.20–1.20)
Total Protein: 8.1 g/dL (ref 6.4–8.3)

## 2013-03-13 LAB — CBC WITH DIFFERENTIAL/PLATELET
Basophils Absolute: 0.1 10*3/uL (ref 0.0–0.1)
Eosinophils Absolute: 0.5 10*3/uL (ref 0.0–0.5)
HCT: 33.2 % — ABNORMAL LOW (ref 34.8–46.6)
HGB: 11.3 g/dL — ABNORMAL LOW (ref 11.6–15.9)
MCH: 32.2 pg (ref 25.1–34.0)
MCV: 94.6 fL (ref 79.5–101.0)
MONO%: 7 % (ref 0.0–14.0)
NEUT#: 5.1 10*3/uL (ref 1.5–6.5)
NEUT%: 52.6 % (ref 38.4–76.8)
Platelets: 266 10*3/uL (ref 145–400)
RDW: 13.7 % (ref 11.2–14.5)

## 2013-03-13 MED ORDER — PROCHLORPERAZINE MALEATE 10 MG PO TABS: 10.0000 mg | ORAL_TABLET | Freq: Four times a day (QID) | ORAL | Status: DC | PRN

## 2013-03-13 MED ORDER — LORAZEPAM 0.5 MG PO TABS: 0.5000 mg | ORAL_TABLET | Freq: Every evening | ORAL | Status: DC | PRN

## 2013-03-13 MED ORDER — DEXAMETHASONE 4 MG PO TABS
8.0000 mg | ORAL_TABLET | Freq: Two times a day (BID) | ORAL | Status: DC
Start: 2013-03-13 — End: 2013-04-30

## 2013-03-13 MED ORDER — LIDOCAINE-PRILOCAINE 2.5-2.5 % EX CREA: TOPICAL_CREAM | CUTANEOUS | Status: DC | PRN

## 2013-03-13 NOTE — Telephone Encounter (Signed)
appts made and printed...td 

## 2013-03-13 NOTE — Progress Notes (Signed)
ID: Tabitha Ramirez OB: July 10, 1946  MR#: [id number]  CSN#:[id number]  PCP: Tabitha Meiers, MD GYN:  Tabitha Ramirez SU: Tabitha Ramirez OTHER MD: Tabitha Ramirez   HISTORY OF PRESENT ILLNESS: Tabitha Ramirez tells me she felt a mass in her right breast about 3 months ago but initially ignored it. She eventually brought it to her physician's attention and was set up for bilateral diagnostic mammography at [facility]a breast Ramirez 01/31/2013. This showed an area of distortion in Tabitha upper outer quadrant measuring approximately 4.3 cm. Diffuse calcifications in both breasts were stable. Tabitha mass was palpable at 12:00, 3 cm from Tabitha nipple, and by ultrasound it was irregular, hypoechoic, and measured 2.5 cm. In addition there was prominent right axillary adenopathy Tabitha largest lymph node measuring 2.5 cm.  Biopsy of Tabitha right breast mass and larger lymph node 01/31/2013 showed ([id number]) in Tabitha breast, and invasive ductal carcinoma, grade 3, triple negative, with an MIB-1 of 84%. Biopsy of Tabitha lymph node was negative. It did show some lymph node tissue.  Bilateral breast MRI 02/10/2013 showed a ring-enhancing necrotic-appearing 3.4 cm mass in Tabitha anterior third of Tabitha right breast. There was also a 2.0 cm level one right axillary lymph node that was prominent, but maintains central fat. Tabitha left breast Tabitha left axilla were benign and there was no internal mammary adenopathy noted.  Tabitha patient's subsequent history is as detailed below.  INTERVAL HISTORY: Tabitha Ramirez returns today accompanied by her daughter Tabitha Ramirez for final plans regarding her neoadjuvant chemotherapy. Since Tabitha last visit here she had her sentinel lymph node sampling, which showed 1 of 2 sentinel lymph nodes to be involved. She also had her port placed and completed advanced directives  REVIEW OF SYSTEMS: Tabitha Ramirez tolerated Tabitha recent procedures without unusual complications. She is trying to increase her exercise tolerance and has AP diameter of. Her goal is a  minimum of 5000 steps a day, increasing to 10,000. A detailed review of systems today was otherwise entirely negative.  PAST MEDICAL HISTORY: Past Medical History  Diagnosis Date  . Heart murmur   . Hypertension   . Anemia   . Allergy   . Arthritis   . Breast cancer   . Migraines     PAST SURGICAL HISTORY: Past Surgical History  Procedure Laterality Date  . Abdominal hysterectomy      With bilateral salpingo-oophorectomy  . Portacath placement N/A 03/10/2013    Procedure: INSERTION PORT-A-CATH WITH FLUOROSCOPY AND ULTRASOUND;  Surgeon: Tabitha Mention, MD;  Location: [facility];  Service: General;  Laterality: N/A;  . Axillary lymph node biopsy Right 03/10/2013    Procedure: AXILLARY LYMPH NODE BIOPSY;  Surgeon: Tabitha Mention, MD;  Location: [facility];  Service: General;  Laterality: Right;  sentinel node with blue dye    FAMILY HISTORY Family History  Problem Relation Age of Onset  . Cancer Mother   . Congestive Heart Failure Mother   . Cancer Brother   . Diabetes Brother   . Heart disease Brother     AMI 05/22/2012  . Congestive Heart Failure Brother   . Cancer Maternal Grandmother    Tabitha patient's father died in an automobile accident at Tabitha age of 51. Tabitha patient's mother lived to be 27. Tabitha Ramirez has 3 brothers, no sisters. One brother had colon cancer at Tabitha age of 33. Tabitha patient's mother and Tabitha patient's mothers mother (maternal grandmother) both were diagnosed with some kind of cancer at Tabitha age of 82 Ramirez thereabouts, but Tabitha Ramirez does  not know what type of cancer this may have been.  GYNECOLOGIC HISTORY:  Menarche age 23, first live birth age 63. Tabitha patient had her hysterectomy in Tabitha 1970s. She has been on estrogen replacement for she thinks 18 years.  SOCIAL HISTORY:  Tabitha Ramirez is a former Tabitha Ramirez for [employer]ith[employer] Ramirez. She is now retired. She is divorced, lives by herself, with no pets. Her first child was given up for adoption. Her second  child, Tabitha Ramirez, is a Geophysicist/field seismologist and is working towards a PhD at Tabitha Ramirez. Tabitha Ramirez has no grandchildren. She attends new Tabitha Ramirez    ADVANCED DIRECTIVES: In place. Tabitha patient has named her daughter Tabitha Ramirez as her healthcare Ramirez of attorney. Tabitha Ramirez can be reached at [phone]. Tabitha healthcare Ramirez of attorney documents were brought by Tabitha patient 03/13/2013 and are separately scanned.   HEALTH MAINTENANCE: History  Substance Use Topics  . Smoking status: Former Smoker    Types: Cigarettes  . Smokeless tobacco: Never Used     Comment: smoked in college 1 yr /occ wine  . Alcohol Use: No     Colonoscopy: August 2012  PAP:  Bone density: Never  Lipid panel:  Allergies  Allergen Reactions  . Penicillins Rash    Current Outpatient Prescriptions  Medication Sig Dispense Refill  . acetaminophen (TYLENOL) 500 MG tablet Take 500 mg by mouth every 6 (six) hours as needed for pain. For pain      . Boswellia Serrata Extract POWD Take 250 mg by mouth daily.       . diphenhydrAMINE (BENADRYL) 12.5 MG/5ML liquid Take 12.5 mg by mouth 4 (four) times daily as needed for allergies.      Marland Kitchen HYDROcodone-acetaminophen (NORCO/VICODIN) 5-325 MG per tablet Take 1-2 tablets by mouth every 4 (four) hours as needed for pain.  40 tablet  1  . Misc Natural Products (WHITE WILLOW BARK PO) Take 400 mg by mouth daily.      . Multiple Vitamin (MULTIVITAMIN WITH MINERALS) TABS Take 1 tablet by mouth daily.      Marland Kitchen OVER Tabitha COUNTER MEDICATION Take by mouth as needed. curamin      . OVER Tabitha COUNTER MEDICATION Take by mouth 1 day Ramirez 1 dose. Miracle 2000 vit liquid      . triamterene-hydrochlorothiazide (MAXZIDE-25) 37.5-25 MG per tablet Take 1 tablet by mouth daily.  30 tablet  3   No current facility-administered medications for this visit.    OBJECTIVE: Middle-aged [place] Tabitha woman in no acute distress Filed Vitals:   03/13/13 0929   BP: 162/80  Pulse: 84  Temp: 98.8 F (37.1 C)  Resp: 20     Body mass index is 26.9 kg/(m^2).    ECOG FS: 0  Sclerae unicteric Oropharynx clear No cervical Ramirez supraclavicular adenopathy Lungs no rales Ramirez rhonchi Heart regular rate and rhythm Abd benign MSK no focal spinal tenderness, no peripheral edema Neuro: nonfocal, well oriented, appropriate affect Breasts: Tabitha palpable mass in Tabitha superior aspect of Tabitha right breast, above Tabitha nipple, measures approximately 4 cm,. There are no skin changes. Both nipples are slightly retracted, but there is no evidence of nipple involvement. Palpation of Tabitha right axilla is unremarkable. Tabitha left breast is unremarkable.  LAB RESULTS:  CMP     Component Value Date/Time   NA 139 02/28/2013 0937   NA 141 02/12/2013 0820   K 3.5 02/28/2013 0937   K 3.4*  02/12/2013 0820   CL 101 02/28/2013 0937   CL 103 02/12/2013 0820   CO2 29 02/28/2013 0937   CO2 28 02/12/2013 0820   GLUCOSE 123* 02/28/2013 0937   GLUCOSE 115* 02/12/2013 0820   BUN 17 02/28/2013 0937   BUN 19.5 02/12/2013 0820   CREATININE 1.00 02/28/2013 0937   CREATININE 1.1 02/12/2013 0820   CREATININE 0.94 11/29/2011 1535   CALCIUM 9.5 02/28/2013 0937   CALCIUM 9.5 02/12/2013 0820   PROT 8.0 02/28/2013 0937   PROT 7.9 02/12/2013 0820   ALBUMIN 3.5 02/28/2013 0937   ALBUMIN 3.4* 02/12/2013 0820   AST 27 02/28/2013 0937   AST 26 02/12/2013 0820   ALT 27 02/28/2013 0937   ALT 29 02/12/2013 0820   ALKPHOS 96 02/28/2013 0937   ALKPHOS 90 02/12/2013 0820   BILITOT 0.3 02/28/2013 0937   BILITOT 0.40 02/12/2013 0820   GFRNONAA 58* 02/28/2013 0937   GFRAA 67* 02/28/2013 0937    I No results found for this basename: SPEP,  UPEP,   kappa and lambda light chains    Lab Results  Component Value Date   WBC 9.6 03/13/2013   NEUTROABS 5.1 03/13/2013   HGB 11.3* 03/13/2013   HCT 33.2* 03/13/2013   MCV 94.6 03/13/2013   PLT 266 03/13/2013      Chemistry      Component Value Date/Time   NA 139 02/28/2013 0937   NA 141  02/12/2013 0820   K 3.5 02/28/2013 0937   K 3.4* 02/12/2013 0820   CL 101 02/28/2013 0937   CL 103 02/12/2013 0820   CO2 29 02/28/2013 0937   CO2 28 02/12/2013 0820   BUN 17 02/28/2013 0937   BUN 19.5 02/12/2013 0820   CREATININE 1.00 02/28/2013 0937   CREATININE 1.1 02/12/2013 0820   CREATININE 0.94 11/29/2011 1535      Component Value Date/Time   CALCIUM 9.5 02/28/2013 0937   CALCIUM 9.5 02/12/2013 0820   ALKPHOS 96 02/28/2013 0937   ALKPHOS 90 02/12/2013 0820   AST 27 02/28/2013 0937   AST 26 02/12/2013 0820   ALT 27 02/28/2013 0937   ALT 29 02/12/2013 0820   BILITOT 0.3 02/28/2013 0937   BILITOT 0.40 02/12/2013 0820       No results found for this basename: LABCA2    No components found with this basename: [id number]    No results found for this basename: INR,  in Tabitha last 168 hours  Urinalysis    Component Value Date/Time   COLORURINE YELLOW 02/28/2013 0918   APPEARANCEUR CLEAR 02/28/2013 0918   LABSPEC 1.020 02/28/2013 0918   PHURINE 6.5 02/28/2013 0918   GLUCOSEU NEGATIVE 02/28/2013 0918   HGBUR TRACE* 02/28/2013 0918   BILIRUBINUR NEGATIVE 02/28/2013 0918   KETONESUR NEGATIVE 02/28/2013 0918   PROTEINUR NEGATIVE 02/28/2013 0918   UROBILINOGEN 0.2 02/28/2013 0918   NITRITE NEGATIVE 02/28/2013 0918   LEUKOCYTESUR NEGATIVE 02/28/2013 0918    STUDIES: Chest 2 View  02/28/2013   *RADIOLOGY REPORT*  Clinical Data: Preoperative evaluation for Port-A-Cath insertion with fluoroscopy ultrasound and right axillary lymph node biopsy. Heart murmur and hypertension  CHEST - 2 VIEW  Comparison: 05/30/2012  Findings: There is curvature of Tabitha thoracic spine convex right and this appears stable. Taking this into consideration heart size is upper limits of normal and stable.  A normal mediastinal contour is seen.  Tabitha lung fields remain clear with no signs of focal infiltrate Ramirez congestive failure.  No pleural fluid Ramirez  significant peribronchial cuffing is identified.  Bony structures appear intact.  IMPRESSION: Stable  cardiopulmonary appearance with no new focal Ramirez acute abnormality identified.   Original Report Authenticated By: Rhodia Albright, M.D.   Nm Sentinel Node Inj-no Rpt (breast)  03/10/2013   CLINICAL DATA: Cancer right breast   Sulfur colloid was injected intradermally by Tabitha nuclear medicine  Ramirez for breast cancer sentinel node localization.    Dg Chest Port 1 View  03/10/2013   *RADIOLOGY REPORT*  Clinical Data: Port-A-Cath insertion.  Breast cancer.  PORTABLE CHEST - 1 VIEW  Comparison: 02/28/2013.  Findings: Poor inspiration.  Enlarged cardiac silhouette with an interval increase in size, magnified by Tabitha poor inspiration and portable AP technique.  Mild increase in prominence of Tabitha pulmonary vasculature and interstitial markings, also accentuated by Tabitha poor inspiration.  Interval left subclavian porta catheter with its tip in Tabitha upper right atrium.  No pneumothorax.  Stable mild to moderate scoliosis.  IMPRESSION:  1.  Left subclavian porta catheter tip in Tabitha right atrium. 2.  No pneumothorax. 3.  Interval cardiomegaly, pulmonary vascular congestion and possible mild interstitial pulmonary edema, all accentuated by Tabitha poor inspiration.   Original Report Authenticated By: Beckie Salts, M.D.   Dg Fluoro Guide Cv Line-no Report  03/10/2013   CLINICAL DATA: porta cath placement   FLOURO GUIDE CV LINE  Fluoroscopy was utilized by Tabitha requesting physician.  No radiographic  interpretation.      ASSESSMENT: 67 y.o. [place] woman status post right breast biopsy 01/31/2013 for a clinical T2 NX, stage II invasive ductal carcinoma, grade 3, triple negative, with an MIB-1 of 84%  (1) biopsy of an enlarged (2.5 cm) right axillary lymph node 01/31/2013 was negative, felt possibly discordant  (2) sentinel lymph node sampling 03/10/2013 showed one of two sentinel nodes to be positive; repeat prognostic profile pending  (3) neoadjuvant doxorubicin and cyclophosphamide to be given in dose dense  fashion with Neulasta support starting 03/17/2013, to be followed by weekly carboplatin and paclitaxel x12  PLAN: I spent approximately 45 minutes today with Tabitha Ramirez and her daughter and also entered all Tabitha orders for her chemotherapy and visits through mid August. Specifically:  Tabitha Ramirez understands she has stage IIB invasive ductal breast cancer, which is triple negative. We want to optimize her chemotherapy with Tabitha hopes of obtaining a complete pathologic response. She just had an echo which was fine so she is ready to start doxorubicin and cyclophosphamide to be given in dose dense fashion x4 beginning June 23, with Neulasta support.  We discussed Tabitha possible toxicities, side effects, and complications in detail and she was given a "map" today on how to take her antinausea medications. I wrote her prescription for dexamethasone, prochlorperazine, lorazepam and Tabitha numbing cream. She will also meet with our "chemotherapy school" nurse to discuss all this in more detail and to have a tumor of Tabitha treatment area.  We are starting chemotherapy June 23. She has appointments through August 18 at which time we will start Tabitha second part of her treatment, which will consist of weekly carboplatin and docetaxel. She knows to call for any problems that may develop before Tabitha next visit.   Tabitha Dell, MD   03/13/2013 9:33 AM

## 2013-03-13 NOTE — Telephone Encounter (Signed)
Per Dr. Darnelle Catalan I have scheduled appts. JW

## 2013-03-17 ENCOUNTER — Ambulatory Visit (HOSPITAL_BASED_OUTPATIENT_CLINIC_OR_DEPARTMENT_OTHER): Payer: Medicare Other

## 2013-03-17 VITALS — BP 124/70 | HR 80 | Temp 98.5°F

## 2013-03-17 DIAGNOSIS — C50111 Malignant neoplasm of central portion of right female breast: Secondary | ICD-10-CM

## 2013-03-17 DIAGNOSIS — Z5111 Encounter for antineoplastic chemotherapy: Secondary | ICD-10-CM

## 2013-03-17 DIAGNOSIS — C50919 Malignant neoplasm of unspecified site of unspecified female breast: Secondary | ICD-10-CM

## 2013-03-17 MED ORDER — HEPARIN SOD (PORK) LOCK FLUSH 100 UNIT/ML IV SOLN
500.0000 [IU] | Freq: Once | INTRAVENOUS | Status: AC | PRN
  Administered 2013-03-17: 500 [IU]
  Filled 2013-03-17: qty 5

## 2013-03-17 MED ORDER — DEXAMETHASONE SODIUM PHOSPHATE 20 MG/5ML IJ SOLN
12.0000 mg | Freq: Once | INTRAMUSCULAR | Status: AC
  Administered 2013-03-17: 12 mg via INTRAVENOUS

## 2013-03-17 MED ORDER — PALONOSETRON HCL INJECTION 0.25 MG/5ML
0.2500 mg | Freq: Once | INTRAVENOUS | Status: AC
  Administered 2013-03-17: 0.25 mg via INTRAVENOUS

## 2013-03-17 MED ORDER — SODIUM CHLORIDE 0.9 % IV SOLN
Freq: Once | INTRAVENOUS | Status: AC
  Administered 2013-03-17: 15:00:00 via INTRAVENOUS

## 2013-03-17 MED ORDER — SODIUM CHLORIDE 0.9 % IV SOLN
150.0000 mg | Freq: Once | INTRAVENOUS | Status: AC
  Administered 2013-03-17: 150 mg via INTRAVENOUS
  Filled 2013-03-17: qty 5

## 2013-03-17 MED ORDER — DOXORUBICIN HCL CHEMO IV INJECTION 2 MG/ML
60.0000 mg/m2 | Freq: Once | INTRAVENOUS | Status: AC
  Administered 2013-03-17: 108 mg via INTRAVENOUS
  Filled 2013-03-17: qty 54

## 2013-03-17 MED ORDER — SODIUM CHLORIDE 0.9 % IV SOLN
600.0000 mg/m2 | Freq: Once | INTRAVENOUS | Status: AC
  Administered 2013-03-17: 1080 mg via INTRAVENOUS
  Filled 2013-03-17: qty 54

## 2013-03-17 MED ORDER — SODIUM CHLORIDE 0.9 % IJ SOLN
10.0000 mL | INTRAMUSCULAR | Status: DC | PRN
  Administered 2013-03-17: 10 mL
  Filled 2013-03-17: qty 10

## 2013-03-17 NOTE — Progress Notes (Signed)
Brisk blood return noted before, during and at end of Adriamycin.

## 2013-03-17 NOTE — Patient Instructions (Addendum)
[facility] Discharge Instructions for Patients Receiving Chemotherapy  Today you received the following chemotherapy agents Adriamycin/Cytoxan To help prevent nausea and vomiting after your treatment, we encourage you to take your nausea medication as prescribed. If you develop nausea and vomiting that is not controlled by your nausea medication, call the clinic.   BELOW ARE SYMPTOMS THAT SHOULD BE REPORTED IMMEDIATELY:  *FEVER GREATER THAN 100.5 F  *CHILLS WITH OR WITHOUT FEVER  NAUSEA AND VOMITING THAT IS NOT CONTROLLED WITH YOUR NAUSEA MEDICATION  *UNUSUAL SHORTNESS OF BREATH  *UNUSUAL BRUISING OR BLEEDING  TENDERNESS IN MOUTH AND THROAT WITH OR WITHOUT PRESENCE OF ULCERS  *URINARY PROBLEMS  *BOWEL PROBLEMS  UNUSUAL RASH Items with * indicate a potential emergency and should be followed up as soon as possible.  Feel free to call the clinic you have any questions or concerns. The clinic phone number is [phone].    

## 2013-03-18 ENCOUNTER — Ambulatory Visit (HOSPITAL_BASED_OUTPATIENT_CLINIC_OR_DEPARTMENT_OTHER): Payer: Medicare Other

## 2013-03-18 VITALS — BP 121/59 | HR 76 | Temp 97.9°F

## 2013-03-18 DIAGNOSIS — C50111 Malignant neoplasm of central portion of right female breast: Secondary | ICD-10-CM

## 2013-03-18 DIAGNOSIS — C50919 Malignant neoplasm of unspecified site of unspecified female breast: Secondary | ICD-10-CM

## 2013-03-18 MED ORDER — PEGFILGRASTIM INJECTION 6 MG/0.6ML
6.0000 mg | Freq: Once | SUBCUTANEOUS | Status: AC
  Administered 2013-03-18: 6 mg via SUBCUTANEOUS
  Filled 2013-03-18: qty 0.6

## 2013-03-18 NOTE — Patient Instructions (Addendum)

## 2013-03-19 ENCOUNTER — Telehealth: Payer: Self-pay | Admitting: *Deleted

## 2013-03-19 NOTE — Telephone Encounter (Signed)
Message copied by Augusto Garbe on Wed Mar 19, 2013  4:12 PM ------      Message from: Jolaine Artist      Created: Mon Mar 17, 2013  3:54 PM      Regarding: chemo follow-up call       1st Adriamycin/Cytoxan  Dr. Darnelle Catalan [phone] ------

## 2013-03-19 NOTE — Telephone Encounter (Signed)
Called patient, message left requesting return call.  Patient returned call within one minute.  Says she is doing well.  No n/v.  Eating and drinking well.  Sleeping with no problems.  Denies any changes from what has been administered to date.  Asked why she only has two treatments scheduled out of the four mentioned by Dr. Darnelle Catalan.  Infusion room scheduler schedules these appointments and they are in the works is the reason given.  Encouraged to drink lots of fluids and to call if any changes in status.

## 2013-03-24 ENCOUNTER — Telehealth: Payer: Self-pay | Admitting: Oncology

## 2013-03-24 ENCOUNTER — Telehealth (INDEPENDENT_AMBULATORY_CARE_PROVIDER_SITE_OTHER): Payer: Self-pay | Admitting: General Surgery

## 2013-03-24 ENCOUNTER — Encounter: Payer: Self-pay | Admitting: Physician Assistant

## 2013-03-24 ENCOUNTER — Other Ambulatory Visit (HOSPITAL_BASED_OUTPATIENT_CLINIC_OR_DEPARTMENT_OTHER): Payer: Medicare Other | Admitting: Lab

## 2013-03-24 ENCOUNTER — Ambulatory Visit (HOSPITAL_BASED_OUTPATIENT_CLINIC_OR_DEPARTMENT_OTHER): Payer: Medicare Other | Admitting: Physician Assistant

## 2013-03-24 VITALS — BP 132/81 | HR 92 | Temp 98.6°F | Resp 20 | Ht 64.0 in | Wt 154.4 lb

## 2013-03-24 DIAGNOSIS — D649 Anemia, unspecified: Secondary | ICD-10-CM

## 2013-03-24 DIAGNOSIS — C50111 Malignant neoplasm of central portion of right female breast: Secondary | ICD-10-CM

## 2013-03-24 DIAGNOSIS — C50119 Malignant neoplasm of central portion of unspecified female breast: Secondary | ICD-10-CM

## 2013-03-24 DIAGNOSIS — D702 Other drug-induced agranulocytosis: Secondary | ICD-10-CM

## 2013-03-24 DIAGNOSIS — B370 Candidal stomatitis: Secondary | ICD-10-CM

## 2013-03-24 LAB — CBC WITH DIFFERENTIAL/PLATELET
Basophils Absolute: 0 10*3/uL (ref 0.0–0.1)
EOS%: 4.4 % (ref 0.0–7.0)
Eosinophils Absolute: 0.1 10*3/uL (ref 0.0–0.5)
HCT: 32.3 % — ABNORMAL LOW (ref 34.8–46.6)
HGB: 11.1 g/dL — ABNORMAL LOW (ref 11.6–15.9)
MCH: 33.2 pg (ref 25.1–34.0)
MONO#: 0.1 10*3/uL (ref 0.1–0.9)
NEUT#: 0.8 10*3/uL — ABNORMAL LOW (ref 1.5–6.5)
NEUT%: 34.8 % — ABNORMAL LOW (ref 38.4–76.8)
RDW: 13.4 % (ref 11.2–14.5)
WBC: 2.3 10*3/uL — ABNORMAL LOW (ref 3.9–10.3)
lymph#: 1.3 10*3/uL (ref 0.9–3.3)

## 2013-03-24 MED ORDER — CEPHALEXIN 500 MG PO CAPS: 500.0000 mg | ORAL_CAPSULE | Freq: Two times a day (BID) | ORAL | Status: DC

## 2013-03-24 MED ORDER — NYSTATIN 100000 UNIT/ML MT SUSP: 500000.0000 [IU] | Freq: Four times a day (QID) | OROMUCOSAL | Status: DC

## 2013-03-24 NOTE — Telephone Encounter (Signed)
, °

## 2013-03-24 NOTE — Telephone Encounter (Signed)
LMOM asking patient to return my call need to R/S her appt from 7/8..had to cx due to HI in surgery that day...if patient is healing ok and not having any problems we can push her out to first available but his office is starting at 2:00 on 03/26/13 if she can come on that day

## 2013-03-24 NOTE — Progress Notes (Signed)
ID: Tabitha Ramirez OB: [birth year]  MR#: [id number]  CSN#:[id number]  PCP: Carrolyn Meiers, MD GYN:  Lavina Hamman SU: Claud Kelp OTHER MD: Lurline Hare   HISTORY OF PRESENT ILLNESS: Tabitha Ramirez tells me she felt a mass in her right breast about 3 months ago but initially ignored it. She eventually brought it to her physician's attention and was set up for bilateral diagnostic mammography at the [facility] Center 01/31/2013. This showed an area of distortion in the upper outer quadrant measuring approximately 4.3 cm. Diffuse calcifications in both breasts were stable. The mass was palpable at 12:00, 3 cm from the nipple, and by ultrasound it was irregular, hypoechoic, and measured 2.5 cm. In addition there was prominent right axillary adenopathy the largest lymph node measuring 2.5 cm.  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 Biopsy of the lymph node was negative. It did show some lymph node tissue.  Bilateral breast MRI 02/10/2013 showed a ring-enhancing necrotic-appearing 3.4 cm mass in the anterior third of the right breast. There was also a 2.0 cm level one right axillary lymph node that was prominent, but maintains central fat. The left breast the left axilla were benign and there was no internal mammary adenopathy noted.  The patient's subsequent history is as detailed below.  INTERVAL HISTORY: Tabitha Ramirez returns today accompanied by her daughter Tabitha Ramirez for followup of her locally advanced right breast carcinoma. She's currently day 8 cycle 1 of 4 planned dose dense cycles of doxorubicin/cyclophosphamide being given in the neoadjuvant setting. She receives Neulasta on day 2 for granulocyte support.  Overall, Tabitha Ramirez actually tolerated treatment very well. She feels a little tired today, and her only other complaint is a slight sore throat. She's had no fevers, chills,  or night sweats. She's having no problems actually swallowing, and is able to eat and drink with no problems.  REVIEW OF SYSTEMS: Tabitha Ramirez denies any rashes or skin changes. She's had no abnormal bruising or bleeding. She has any problems whatsoever with nausea or emesis and has had no change in bowel or bladder habits. She also denies any cough, shortness of breath, chest pain, or palpitations. She's had no abnormal headaches or dizziness. She has an occasional "twinge" in the right elbow, but denies any additional myalgias, arthralgias, or bony pain. She's had no peripheral swelling.  Detailed review of systems is otherwise noncontributory.   PAST MEDICAL HISTORY: Past Medical History  Diagnosis Date  . Heart murmur   . Hypertension   . Anemia   . Allergy   . Arthritis   . Breast cancer   . Migraines     PAST SURGICAL HISTORY: Past Surgical History  Procedure Laterality Date  . Abdominal hysterectomy      With bilateral salpingo-oophorectomy  . Portacath placement N/A 03/10/2013    Procedure: INSERTION PORT-A-CATH WITH FLUOROSCOPY AND ULTRASOUND;  Surgeon: Ernestene Mention, MD;  Location: [facility], [place] OR;  Service: General;  Laterality: N/A;  . Axillary lymph node biopsy Right 03/10/2013    Procedure: AXILLARY LYMPH NODE BIOPSY;  Surgeon: Ernestene Mention, MD;  Location: [facility] OR;  Service: General;  Laterality: Right;  sentinel node with blue dye    FAMILY HISTORY Family History  Problem Relation Age of Onset  . Cancer Mother   . Congestive Heart Failure Mother   . Cancer Brother   . Diabetes Brother   . Heart disease  Brother     AMI 05/22/2012  . Congestive Heart Failure Brother   . Cancer Maternal Grandmother    the patient's father died in an automobile accident at the age of 38. The patient's mother lived to be 69. Tabitha Ramirez has 3 brothers, no sisters. One brother had colon cancer at the age of 56. The patient's mother and the patient's mothers mother (maternal grandmother) both were  diagnosed with some kind of cancer at the age of 17 or thereabouts, but Tabitha Ramirez does not know what type of cancer this may have been.  GYNECOLOGIC HISTORY:  Menarche age 34, first live birth age 66. The patient had her hysterectomy in the 1970s. She has been on estrogen replacement for she thinks 18 years.  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 She is divorced, lives by herself, with no pets. Her first child was given up for adoption. Her second child, Tabitha Ramirez, is a Geophysicist/field seismologist and is working towards a PhD at [employer]. Aneri has no grandchildren. She attends new [employer]    ADVANCED DIRECTIVES: In place. The patient has named her daughter Tabitha Ramirez as her healthcare power of attorney. Tabitha Ramirez can be reached at [phone]. The healthcare power of attorney documents were brought by the patient 03/13/2013 and are separately scanned.   HEALTH MAINTENANCE: History  Substance Use Topics  . Smoking status: Former Smoker    Types: Cigarettes  . Smokeless tobacco: Never Used     Comment: smoked in college 1 yr /occ wine  . Alcohol Use: No     Colonoscopy: August 2012  PAP:  Bone density: Never  Lipid panel:  Allergies  Allergen Reactions  . Penicillins Rash    Current Outpatient Prescriptions  Medication Sig Dispense Refill  . acetaminophen (TYLENOL) 500 MG tablet Take 500 mg by mouth every 6 (six) hours as needed for pain. For pain      . Boswellia Serrata Extract POWD Take 250 mg by mouth daily.       Marland Kitchen dexamethasone (DECADRON) 4 MG tablet Take 2 tablets (8 mg total) by mouth 2 (two) times daily with a meal.  24 tablet  3  . diphenhydrAMINE (BENADRYL) 12.5 MG/5ML liquid Take 12.5 mg by mouth 4 (four) times daily as needed for allergies.      Marland Kitchen HYDROcodone-acetaminophen (NORCO/VICODIN) 5-325 MG per tablet Take 1-2 tablets by mouth every 4  (four) hours as needed for pain.  40 tablet  1  . lidocaine-prilocaine (EMLA) cream Apply topically as needed. Apply to port area 1-2 hours before chemotherapy; cover with plastioc wrap  30 g  2  . LORazepam (ATIVAN) 0.5 MG tablet Take 1 tablet (0.5 mg total) by mouth at bedtime as needed for anxiety.  20 tablet  0  . Misc Natural Products (WHITE WILLOW BARK PO) Take 400 mg by mouth daily.      . Multiple Vitamin (MULTIVITAMIN WITH MINERALS) TABS Take 1 tablet by mouth daily.      Marland Kitchen OVER THE COUNTER MEDICATION Take by mouth as needed. curamin      . OVER THE COUNTER MEDICATION Take by mouth 1 day or 1 dose. Miracle 2000 vit liquid      . PREMARIN 0.3 MG tablet       . prochlorperazine (COMPAZINE) 10 MG tablet Take 1 tablet (10 mg total) by mouth every 6 (six) hours as  needed.  30 tablet  3  . triamterene-hydrochlorothiazide (MAXZIDE-25) 37.5-25 MG per tablet Take 1 tablet by mouth daily.  30 tablet  3  . cephALEXin (KEFLEX) 500 MG capsule Take 1 capsule (500 mg total) by mouth 2 (two) times daily.  14 capsule  4  . nystatin (MYCOSTATIN) 100000 UNIT/ML suspension Take 5 mLs (500,000 Units total) by mouth 4 (four) times daily.  180 mL  0   No current facility-administered medications for this visit.    OBJECTIVE: Middle-aged [place] American woman in no acute distress Filed Vitals:   03/24/13 1430  BP: 132/81  Pulse: 92  Temp: 98.6 F (37 C)  Resp: 20     Body mass index is 26.49 kg/(m^2).    ECOG FS: 1 Filed Weights   03/24/13 1430  Weight: 154 lb 6.4 oz (70.035 kg)    Sclerae unicteric Oropharynx shows no evidence of ulcerations, but there is a white coating in the posterior oropharynx consistent with candidiasis  No cervical or supraclavicular adenopathy Lungs clear to auscultation, no wheezes, no rales or rhonchi Heart regular rate and rhythm Abdomen soft, nontender to palpation, positive bowel sounds MSK no focal spinal tenderness, no peripheral edema Neuro: nonfocal, well  oriented, appropriate affect Breasts: The palpable mass in the upper outer quadrant of the right breast measures proximally 4-4.5 cm. No obvious skin changes noted. Nipples are slightly retracted bilaterally (which patient says is chronic). Left breast is otherwise unremarkable. Axillae is benign bilaterally with no palpable adenopathy. Port is intact in the left upper chest wall with no erythema, edema, or evidence of infection.    LAB RESULTS:   Lab Results  Component Value Date   WBC 2.3* 03/24/2013   NEUTROABS 0.8* 03/24/2013   HGB 11.1* 03/24/2013   HCT 32.3* 03/24/2013   MCV 96.1 03/24/2013   PLT 214 03/24/2013      Chemistry      Component Value Date/Time   NA 140 03/13/2013 0918   NA 139 02/28/2013 0937   K 3.5 03/13/2013 0918   K 3.5 02/28/2013 0937   CL 100 03/13/2013 0918   CL 101 02/28/2013 0937   CO2 29 03/13/2013 0918   CO2 29 02/28/2013 0937   BUN 12.8 03/13/2013 0918   BUN 17 02/28/2013 0937   CREATININE 1.0 03/13/2013 0918   CREATININE 1.00 02/28/2013 0937   CREATININE 0.94 11/29/2011 1535      Component Value Date/Time   CALCIUM 9.7 03/13/2013 0918   CALCIUM 9.5 02/28/2013 0937   ALKPHOS 99 03/13/2013 0918   ALKPHOS 96 02/28/2013 0937   AST 36* 03/13/2013 0918   AST 27 02/28/2013 0937   ALT 44 03/13/2013 0918   ALT 27 02/28/2013 0937   BILITOT 0.51 03/13/2013 0918   BILITOT 0.3 02/28/2013 0937       STUDIES:   Dg Chest Port 1 View  March 11, 2013   *RADIOLOGY REPORT*  Clinical Data: Port-A-Cath insertion.  Breast cancer.  PORTABLE CHEST - 1 VIEW  Comparison: 02/28/2013.  Findings: Poor inspiration.  Enlarged cardiac silhouette with an interval increase in size, magnified by the poor inspiration and portable AP technique.  Mild increase in prominence of the pulmonary vasculature and interstitial markings, also accentuated by the poor inspiration.  Interval left subclavian porta catheter with its tip in the upper right atrium.  No pneumothorax.  Stable mild to moderate scoliosis.   IMPRESSION:  1.  Left subclavian porta catheter tip in the right atrium. 2.  No pneumothorax. 3.  Interval cardiomegaly, pulmonary vascular congestion and possible mild interstitial pulmonary edema, all accentuated by the poor inspiration.   Original Report Authenticated By: Beckie Salts, M.D.      ASSESSMENT: 67 y.o. [place] woman status post right breast biopsy 01/31/2013 for a clinical T2 NX, stage II invasive ductal carcinoma, grade 3, triple negative, with an MIB-1 of 84%  (1) biopsy of an enlarged (2.5 cm) right axillary lymph node 01/31/2013 was negative, felt possibly discordant  (2) sentinel lymph node sampling 03/10/2013 showed one of two sentinel nodes to be positive; repeat prognostic profile pending  (3) neoadjuvant doxorubicin and cyclophosphamide to be given in dose dense fashion with Neulasta support starting 03/17/2013, to be followed by weekly carboplatin and paclitaxel x12   PLAN: I have reviewed neutropenic precautions with both Tinlee and her daughter Tabitha Ramirez today. I am starting her on Keflex prophylactically, 500 mg by mouth twice a day for the next 7 days, and she knows to contact us with any fevers of 100 or above. We are also going to start nystatin suspension to swish and swallow 4 times daily for the next 7 days for mild oropharyngeal candidiasis.  Rieley will see Dr. Darnelle Catalan next week on July 7 for followup in anticipation of day 1 cycle 2 of neoadjuvant chemotherapy. She voices understanding and agreement with this plan, and will call with any changes or problems prior to that appointment.    Ioan Landini, PA-C   03/24/2013 4:15 PM

## 2013-03-25 ENCOUNTER — Telehealth: Payer: Self-pay | Admitting: *Deleted

## 2013-03-25 NOTE — Telephone Encounter (Signed)
Per staff phone call and POF I have schedueld appts.  JMW  

## 2013-03-26 ENCOUNTER — Encounter (INDEPENDENT_AMBULATORY_CARE_PROVIDER_SITE_OTHER): Payer: Self-pay | Admitting: General Surgery

## 2013-03-26 ENCOUNTER — Ambulatory Visit (INDEPENDENT_AMBULATORY_CARE_PROVIDER_SITE_OTHER): Payer: Medicare Other | Admitting: General Surgery

## 2013-03-26 VITALS — BP 100/62 | HR 78 | Temp 98.9°F | Resp 12 | Ht 64.0 in | Wt 153.0 lb

## 2013-03-26 DIAGNOSIS — C50119 Malignant neoplasm of central portion of unspecified female breast: Secondary | ICD-10-CM

## 2013-03-26 DIAGNOSIS — C50111 Malignant neoplasm of central portion of right female breast: Secondary | ICD-10-CM

## 2013-03-26 NOTE — Progress Notes (Signed)
Patient ID: Tabitha Ramirez, female   DOB: 25-Feb-1946, 67 y.o.   MRN: [id number] History: This patient has locally advanced,  triple negative invasive mammary carcinoma of the right breast. Central. At least 3.5 cm or greater. On 03/10/2013 she underwent injection of blue dye, right axillary sentinel node biopsy and Port-A-Cath insertion. One out of 2 lymph nodes is positive for metastatic carcinoma. She has had her first chemotherapy treatment and the port is working fine. She has no complaints about her incisions. She states that she is going to receive multimodal chemotherapy and this will probably continue until late October. It is her hope that she can have a lumpectomy. She would like the Port-A-Cath removed at the same time as her lumpectomy, if possible.  Exam: Patient looks well. No distress Port left infraclavicular area looks fine. Wound healing well. Right axillary incision healing normally. No hematoma or infection. Normal range of motion right shoulder. No sensory deficit. Right breast reveals large palpable central mass 4 cm in size,  Assessment: Locally advanced, triple negative invasive mammary carcinoma right breast, 12:00 central position, 4 cm. The patient desires breast conservation, if her tumor can be down staged by neoadjuvant chemotherapy Status post recent sentinel node biopsy and Port-A-Cath insertion. Recovering uneventfully Hypertension Anemia History of TAH and BSO  Plan: Proceed with neoadjuvant chemotherapy See me in 6 weeks, mid treatment End of treatment MRI approximately November 1 Discuss and plan definitive breast surgery after end-of-treatment MRI. Remove Port-A-Cath the time of definitive breast surgery, if okay with Dr. Kathryne Hitch M. Derrell Lolling, M.D., [facility] General and Minimally invasive Surgery Breast and Colorectal Surgery Office:   [phone] Pager:   [phone]

## 2013-03-26 NOTE — Patient Instructions (Signed)
You are recovering from your right axillary surgery and the Port-A-Cath insertion without any obvious surgical complications.  Return to see Dr. Derrell Lolling in approximately 6 weeks. That will be midway through your chemotherapy.  At the end of your chemotherapy treatments, late October or November,  we will get another MRI to see how much smaller the tumor is. We will plan your breast surgery at that time. We can remove her Port-A-Cath at the same time as the breast surgery, if Dr. Darnelle Catalan says that he is through with chemotherapy.

## 2013-03-31 ENCOUNTER — Other Ambulatory Visit (HOSPITAL_BASED_OUTPATIENT_CLINIC_OR_DEPARTMENT_OTHER): Payer: Medicare Other | Admitting: Lab

## 2013-03-31 ENCOUNTER — Telehealth: Payer: Self-pay | Admitting: *Deleted

## 2013-03-31 ENCOUNTER — Ambulatory Visit (HOSPITAL_BASED_OUTPATIENT_CLINIC_OR_DEPARTMENT_OTHER): Payer: Medicare Other | Admitting: Oncology

## 2013-03-31 ENCOUNTER — Ambulatory Visit (HOSPITAL_BASED_OUTPATIENT_CLINIC_OR_DEPARTMENT_OTHER): Payer: Medicare Other

## 2013-03-31 VITALS — BP 139/79 | HR 84 | Temp 98.4°F | Resp 20 | Ht 64.0 in | Wt 154.3 lb

## 2013-03-31 DIAGNOSIS — Z171 Estrogen receptor negative status [ER-]: Secondary | ICD-10-CM

## 2013-03-31 DIAGNOSIS — C50111 Malignant neoplasm of central portion of right female breast: Secondary | ICD-10-CM

## 2013-03-31 DIAGNOSIS — C50919 Malignant neoplasm of unspecified site of unspecified female breast: Secondary | ICD-10-CM

## 2013-03-31 DIAGNOSIS — Z5111 Encounter for antineoplastic chemotherapy: Secondary | ICD-10-CM

## 2013-03-31 LAB — CBC WITH DIFFERENTIAL/PLATELET
BASO%: 0.8 % (ref 0.0–2.0)
Basophils Absolute: 0.2 10*3/uL — ABNORMAL HIGH (ref 0.0–0.1)
EOS%: 0.9 % (ref 0.0–7.0)
HGB: 10.5 g/dL — ABNORMAL LOW (ref 11.6–15.9)
MCH: 32.5 pg (ref 25.1–34.0)
NEUT#: 21.3 10*3/uL — ABNORMAL HIGH (ref 1.5–6.5)
RBC: 3.23 10*6/uL — ABNORMAL LOW (ref 3.70–5.45)
RDW: 13.5 % (ref 11.2–14.5)
lymph#: 2.8 10*3/uL (ref 0.9–3.3)
nRBC: 0 % (ref 0–0)

## 2013-03-31 LAB — COMPREHENSIVE METABOLIC PANEL (CC13)
ALT: 29 U/L (ref 0–55)
AST: 23 U/L (ref 5–34)
Albumin: 3.3 g/dL — ABNORMAL LOW (ref 3.5–5.0)
Alkaline Phosphatase: 121 U/L (ref 40–150)
Glucose: 102 mg/dl (ref 70–140)
Potassium: 3.5 mEq/L (ref 3.5–5.1)
Sodium: 139 mEq/L (ref 136–145)
Total Protein: 7.4 g/dL (ref 6.4–8.3)

## 2013-03-31 MED ORDER — DOXORUBICIN HCL CHEMO IV INJECTION 2 MG/ML
60.0000 mg/m2 | Freq: Once | INTRAVENOUS | Status: AC
  Administered 2013-03-31: 108 mg via INTRAVENOUS
  Filled 2013-03-31: qty 54

## 2013-03-31 MED ORDER — PALONOSETRON HCL INJECTION 0.25 MG/5ML
0.2500 mg | Freq: Once | INTRAVENOUS | Status: AC
  Administered 2013-03-31: 0.25 mg via INTRAVENOUS

## 2013-03-31 MED ORDER — SODIUM CHLORIDE 0.9 % IJ SOLN
10.0000 mL | INTRAMUSCULAR | Status: DC | PRN
  Administered 2013-03-31: 10 mL
  Filled 2013-03-31: qty 10

## 2013-03-31 MED ORDER — DEXAMETHASONE SODIUM PHOSPHATE 20 MG/5ML IJ SOLN
12.0000 mg | Freq: Once | INTRAMUSCULAR | Status: AC
  Administered 2013-03-31: 12 mg via INTRAVENOUS

## 2013-03-31 MED ORDER — HEPARIN SOD (PORK) LOCK FLUSH 100 UNIT/ML IV SOLN
500.0000 [IU] | Freq: Once | INTRAVENOUS | Status: AC | PRN
  Administered 2013-03-31: 500 [IU]
  Filled 2013-03-31: qty 5

## 2013-03-31 MED ORDER — SODIUM CHLORIDE 0.9 % IV SOLN
Freq: Once | INTRAVENOUS | Status: AC
  Administered 2013-03-31: 09:00:00 via INTRAVENOUS

## 2013-03-31 MED ORDER — SODIUM CHLORIDE 0.9 % IV SOLN
600.0000 mg/m2 | Freq: Once | INTRAVENOUS | Status: AC
  Administered 2013-03-31: 1080 mg via INTRAVENOUS
  Filled 2013-03-31: qty 54

## 2013-03-31 MED ORDER — SODIUM CHLORIDE 0.9 % IV SOLN
150.0000 mg | Freq: Once | INTRAVENOUS | Status: AC
  Administered 2013-03-31: 150 mg via INTRAVENOUS
  Filled 2013-03-31: qty 5

## 2013-03-31 NOTE — Patient Instructions (Addendum)
[facility] Discharge Instructions for Patients Receiving Chemotherapy  Today you received the following chemotherapy agents Adriamycin and Cytoxan.  To help prevent nausea and vomiting after your treatment, we encourage you to take your nausea medication.   If you develop nausea and vomiting that is not controlled by your nausea medication, call the clinic.   BELOW ARE SYMPTOMS THAT SHOULD BE REPORTED IMMEDIATELY:  *FEVER GREATER THAN 100.5 F  *CHILLS WITH OR WITHOUT FEVER  NAUSEA AND VOMITING THAT IS NOT CONTROLLED WITH YOUR NAUSEA MEDICATION  *UNUSUAL SHORTNESS OF BREATH  *UNUSUAL BRUISING OR BLEEDING  TENDERNESS IN MOUTH AND THROAT WITH OR WITHOUT PRESENCE OF ULCERS  *URINARY PROBLEMS  *BOWEL PROBLEMS  UNUSUAL RASH Items with * indicate a potential emergency and should be followed up as soon as possible.  Feel free to call the clinic you have any questions or concerns. The clinic phone number is [phone].    

## 2013-03-31 NOTE — Telephone Encounter (Signed)
sw pt gv appt for MRI of the breast @[address] for 04/08/13 @ 8:45am.the patient is aware...td

## 2013-03-31 NOTE — Progress Notes (Signed)
ID: Tabitha Ramirez OB: 1946/07/23  MR#: [id number]  CSN#:[id number]  PCP: Vivi Barrack, MD GYN:  Lavina Hamman SU: Claud Kelp OTHER MD: Lurline Hare   HISTORY OF PRESENT ILLNESS: Tabitha Ramirez tells me she felt a mass in her right breast about 3 months ago but initially ignored it. She eventually brought it to her physician's attention and was set up for bilateral diagnostic mammography at the breast Center 01/31/2013. This showed an area of distortion in the upper outer quadrant measuring approximately 4.3 cm. Diffuse calcifications in both breasts were stable. The mass was palpable at 12:00, 3 cm from the nipple, and by ultrasound it was irregular, hypoechoic, and measured 2.5 cm. In addition there was prominent right axillary adenopathy the largest lymph node measuring 2.5 cm.  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 Biopsy of the lymph node was negative. It did show some lymph node tissue.  Bilateral breast MRI 02/10/2013 showed a ring-enhancing necrotic-appearing 3.4 cm mass in the anterior third of the right breast. There was also a 2.0 cm level one right axillary lymph node that was prominent, but maintains central fat. The left breast the left axilla were benign and there was no internal mammary adenopathy noted.  The patient's subsequent history is as detailed below.  INTERVAL HISTORY: Tabitha Ramirez returns today accompanied by her daughter Tabitha Ramirez for followup of her locally advanced right breast carcinoma. She's currently day 1 cycle 2 of 4 planned dose dense cycles of doxorubicin/cyclophosphamide being given in the neoadjuvant setting. She receives Neulasta on day 2 for granulocyte support. Since her last visit here she also met with Dr. Derrell Lolling again to discuss mastectomy versus lumpectomy issues. Of course the final decision will depend on her tumor's  response to chemotherapy.  REVIEW OF SYSTEMS: Tabitha Ramirez generally did quite well with her first cycle of chemotherapy. Her biggest problem is that her hair is beginning to fall out. She is going to have a "but is off" today. She really has a week. Otherwise a detailed review of systems today was remarkably benign.  PAST MEDICAL HISTORY: Past Medical History  Diagnosis Date  . Heart murmur   . Hypertension   . Anemia   . Allergy   . Arthritis   . Breast cancer   . Migraines     PAST SURGICAL HISTORY: Past Surgical History  Procedure Laterality Date  . Abdominal hysterectomy      With bilateral salpingo-oophorectomy  . Portacath placement N/A 03/10/2013    Procedure: INSERTION PORT-A-CATH WITH FLUOROSCOPY AND ULTRASOUND;  Surgeon: Ernestene Mention, MD;  Location: [facility] OR;  Service: General;  Laterality: N/A;  . Axillary lymph node biopsy Right 03/10/2013    Procedure: AXILLARY LYMPH NODE BIOPSY;  Surgeon: Ernestene Mention, MD;  Location: [facility] OR;  Service: General;  Laterality: Right;  sentinel node with blue dye    FAMILY HISTORY Family History  Problem Relation Age of Onset  . Cancer Mother   . Congestive Heart Failure Mother   . Cancer Brother   . Diabetes Brother   . Heart disease Brother     AMI 05/22/2012  . Congestive Heart Failure Brother   . Cancer Maternal Grandmother    the patient's father died in an automobile accident at the age of 53. The patient's mother lived to be 11. Tabitha Ramirez has 3 brothers, no sisters. One brother had colon  cancer at the age of 20. The patient's mother and the patient's mothers mother (maternal grandmother) both were diagnosed with some kind of cancer at the age of 68 or thereabouts, but Tabitha Ramirez does not know what type of cancer this may have been.  GYNECOLOGIC HISTORY:  Menarche age 46, first live birth age 64. The patient had her hysterectomy in the 1970s. She has been on estrogen replacement for she thinks 18 years.  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 She is divorced, lives by herself, with no pets. Her first child was given up for adoption. Her second child, Tabitha Ramirez, is a Geophysicist/field seismologist and is working towards a PhD at [employer]. Tabitha Ramirez has no grandchildren. She attends new [employer]    ADVANCED DIRECTIVES: In place. The patient has named her daughter Tabitha Ramirez as her healthcare power of attorney. Tabitha Ramirez can be reached at [phone]. The healthcare power of attorney documents were brought by the patient 03/13/2013 and are separately scanned.   HEALTH MAINTENANCE: History  Substance Use Topics  . Smoking status: Former Smoker    Types: Cigarettes  . Smokeless tobacco: Never Used     Comment: smoked in college 1 yr /occ wine  . Alcohol Use: No     Colonoscopy: August 2012  PAP:  Bone density: Never  Lipid panel:  Allergies  Allergen Reactions  . Penicillins Rash    Current Outpatient Prescriptions  Medication Sig Dispense Refill  . acetaminophen (TYLENOL) 500 MG tablet Take 500 mg by mouth every 6 (six) hours as needed for pain. For pain      . [place]well[place] Extract POWD Take 250 mg by mouth daily.       . cephALEXin (KEFLEX) 500 MG capsule Take 1 capsule (500 mg total) by mouth 2 (two) times daily.  14 capsule  4  . dexamethasone (DECADRON) 4 MG tablet Take 2 tablets (8 mg total) by mouth 2 (two) times daily with a meal.  24 tablet  3  . diphenhydrAMINE (BENADRYL) 12.5 MG/5ML liquid Take 12.5 mg by mouth 4 (four) times daily as needed for allergies.      Marland Kitchen HYDROcodone-acetaminophen (NORCO/VICODIN) 5-325 MG per tablet Take 1-2 tablets by mouth every 4 (four) hours as needed for pain.  40 tablet  1  . lidocaine-prilocaine (EMLA) cream Apply topically as needed. Apply to port area 1-2 hours before chemotherapy; cover with plastioc wrap  30 g  2  . LORazepam (ATIVAN) 0.5 MG  tablet Take 1 tablet (0.5 mg total) by mouth at bedtime as needed for anxiety.  20 tablet  0  . Misc Natural Products (WHITE WILLOW BARK PO) Take 400 mg by mouth daily.      . Multiple Vitamin (MULTIVITAMIN WITH MINERALS) TABS Take 1 tablet by mouth daily.      Marland Kitchen nystatin (MYCOSTATIN) 100000 UNIT/ML suspension Take 5 mLs (500,000 Units total) by mouth 4 (four) times daily.  180 mL  0  . OVER THE COUNTER MEDICATION Take by mouth as needed. curamin      . OVER THE COUNTER MEDICATION Take by mouth 1 day or 1 dose. Miracle 2000 vit liquid      . PREMARIN 0.3 MG tablet       . prochlorperazine (COMPAZINE) 10 MG tablet Take 1 tablet (10 mg total) by mouth every 6 (six) hours as needed.  30 tablet  3  . triamterene-hydrochlorothiazide (MAXZIDE-25) 37.5-25 MG per tablet Take 1 tablet by mouth daily.  30 tablet  3   No current facility-administered medications for this visit.    OBJECTIVE: Middle-aged [place] American woman in no acute distress Filed Vitals:   03/31/13 0808  BP: 139/79  Pulse: 84  Temp: 98.4 F (36.9 C)  Resp: 20     Body mass index is 26.47 kg/(m^2).    ECOG FS: 1 Filed Weights   03/31/13 0808  Weight: 154 lb 4.8 oz (69.99 kg)    Sclerae unicteric Oropharynx clear No cervical or supraclavicular adenopathy Lungs clear to auscultation bilaterally, good excursion Heart regular rate and rhythm Abdomen soft, nontender, positive bowel sounds MSK no focal spinal tenderness, no peripheral edema Neuro: nonfocal, well oriented, appropriate affect Breasts: The palpable mass in the upper central  right breast seems unchanged, and measures proximally 4-4.5 cm. No obvious skin changes noted. Nipples are slightly retracted bilaterally (which patient says is chronic). Left breast is  unremarkable. The right axilla is benign Port is intact in the left upper chest wall with no erythema, edema, or evidence of infection.    LAB RESULTS:   Lab Results  Component Value Date   WBC  25.8* 03/31/2013   NEUTROABS 21.3* 03/31/2013   HGB 10.5* 03/31/2013   HCT 30.9* 03/31/2013   MCV 95.7 03/31/2013   PLT 175 03/31/2013      Chemistry      Component Value Date/Time   NA 140 03/13/2013 0918   NA 139 02/28/2013 0937   K 3.5 03/13/2013 0918   K 3.5 02/28/2013 0937   CL 100 03/13/2013 0918   CL 101 02/28/2013 0937   CO2 29 03/13/2013 0918   CO2 29 02/28/2013 0937   BUN 12.8 03/13/2013 0918   BUN 17 02/28/2013 0937   CREATININE 1.0 03/13/2013 0918   CREATININE 1.00 02/28/2013 0937   CREATININE 0.94 11/29/2011 1535      Component Value Date/Time   CALCIUM 9.7 03/13/2013 0918   CALCIUM 9.5 02/28/2013 0937   ALKPHOS 99 03/13/2013 0918   ALKPHOS 96 02/28/2013 0937   AST 36* 03/13/2013 0918   AST 27 02/28/2013 0937   ALT 44 03/13/2013 0918   ALT 27 02/28/2013 0937   BILITOT 0.51 03/13/2013 0918   BILITOT 0.3 02/28/2013 0937       STUDIES:   Dg Chest Port 1 View  03/16/2013   *RADIOLOGY REPORT*  Clinical Data: Port-A-Cath insertion.  Breast cancer.  PORTABLE CHEST - 1 VIEW  Comparison: 02/28/2013.  Findings: Poor inspiration.  Enlarged cardiac silhouette with an interval increase in size, magnified by the poor inspiration and portable AP technique.  Mild increase in prominence of the pulmonary vasculature and interstitial markings, also accentuated by the poor inspiration.  Interval left subclavian porta catheter with its tip in the upper right atrium.  No pneumothorax.  Stable mild to moderate scoliosis.  IMPRESSION:  1.  Left subclavian porta catheter tip in the right atrium. 2.  No pneumothorax. 3.  Interval cardiomegaly, pulmonary vascular congestion and possible mild interstitial pulmonary edema, all accentuated by the poor inspiration.   Original Report Authenticated By: Beckie Salts, M.D.      ASSESSMENT: 67 y.o. Tabitha Ramirez woman status post right breast biopsy 01/31/2013 for a clinical T2 NX, stage II invasive ductal carcinoma, grade 3, triple negative, with an MIB-1 of 84%  (1) biopsy of an  enlarged (2.5 cm) right axillary lymph node 01/31/2013 was negative, felt possibly  discordant  (2) sentinel lymph node sampling 03/10/2013 showed one of two sentinel nodes to be positive; repeat prognostic profile pending  (3) neoadjuvant doxorubicin and cyclophosphamide to be given in dose dense fashion with Neulasta support starting 03/17/2013, to be followed by weekly carboplatin and paclitaxel x12   PLAN: Tabitha Ramirez did well with her first cycle of chemotherapy, and she is ready to receive cycle 2 today. She is scheduled for weekly visits through the next 6 weeks by which time she will have completed her first 4 cycles of cyclophosphamide and doxorubicin. At that point, before she sees me August 18 to discuss the start of Taxol, she will have a repeat breast MRI. She knows to call for any problems that may develop before the next visit.   Lowella Dell, MD   03/31/2013 8:27 AM

## 2013-04-01 ENCOUNTER — Ambulatory Visit (HOSPITAL_BASED_OUTPATIENT_CLINIC_OR_DEPARTMENT_OTHER): Payer: Medicare Other

## 2013-04-01 ENCOUNTER — Encounter (INDEPENDENT_AMBULATORY_CARE_PROVIDER_SITE_OTHER): Payer: BC Managed Care – PPO | Admitting: General Surgery

## 2013-04-01 VITALS — BP 142/60 | HR 98 | Temp 98.2°F

## 2013-04-01 DIAGNOSIS — C50119 Malignant neoplasm of central portion of unspecified female breast: Secondary | ICD-10-CM

## 2013-04-01 DIAGNOSIS — C50111 Malignant neoplasm of central portion of right female breast: Secondary | ICD-10-CM

## 2013-04-01 DIAGNOSIS — Z5189 Encounter for other specified aftercare: Secondary | ICD-10-CM

## 2013-04-01 MED ORDER — PEGFILGRASTIM INJECTION 6 MG/0.6ML
6.0000 mg | Freq: Once | SUBCUTANEOUS | Status: AC
  Administered 2013-04-01: 6 mg via SUBCUTANEOUS
  Filled 2013-04-01: qty 0.6

## 2013-04-01 NOTE — Addendum Note (Signed)
Addended by: Billey Co on: 04/01/2013 06:25 PM   Modules accepted: Orders

## 2013-04-01 NOTE — Patient Instructions (Addendum)

## 2013-04-05 ENCOUNTER — Encounter: Payer: Self-pay | Admitting: *Deleted

## 2013-04-05 NOTE — Progress Notes (Signed)
Mailed after appt letter to pt. 

## 2013-04-07 ENCOUNTER — Ambulatory Visit (HOSPITAL_BASED_OUTPATIENT_CLINIC_OR_DEPARTMENT_OTHER): Payer: Medicare Other | Admitting: Physician Assistant

## 2013-04-07 ENCOUNTER — Telehealth: Payer: Self-pay | Admitting: *Deleted

## 2013-04-07 ENCOUNTER — Other Ambulatory Visit (HOSPITAL_BASED_OUTPATIENT_CLINIC_OR_DEPARTMENT_OTHER): Payer: Medicare Other | Admitting: Lab

## 2013-04-07 ENCOUNTER — Encounter: Payer: Self-pay | Admitting: Physician Assistant

## 2013-04-07 VITALS — BP 112/69 | HR 97 | Temp 98.3°F | Resp 20 | Ht 64.0 in | Wt 153.3 lb

## 2013-04-07 DIAGNOSIS — C50119 Malignant neoplasm of central portion of unspecified female breast: Secondary | ICD-10-CM

## 2013-04-07 DIAGNOSIS — C50111 Malignant neoplasm of central portion of right female breast: Secondary | ICD-10-CM

## 2013-04-07 DIAGNOSIS — B370 Candidal stomatitis: Secondary | ICD-10-CM

## 2013-04-07 DIAGNOSIS — D649 Anemia, unspecified: Secondary | ICD-10-CM

## 2013-04-07 LAB — CBC WITH DIFFERENTIAL/PLATELET
Basophils Absolute: 0 10*3/uL (ref 0.0–0.1)
EOS%: 0.2 % (ref 0.0–7.0)
HCT: 27.3 % — ABNORMAL LOW (ref 34.8–46.6)
HGB: 9.4 g/dL — ABNORMAL LOW (ref 11.6–15.9)
MCH: 33 pg (ref 25.1–34.0)
MCV: 95.4 fL (ref 79.5–101.0)
MONO%: 11.6 % (ref 0.0–14.0)
NEUT%: 59.9 % (ref 38.4–76.8)
RDW: 13.8 % (ref 11.2–14.5)

## 2013-04-07 MED ORDER — FLUCONAZOLE 100 MG PO TABS: ORAL_TABLET | ORAL | Status: DC

## 2013-04-07 NOTE — Progress Notes (Signed)
ID: Tabitha Ramirez OB: 1945/12/24  MR#: [id number]  CSN#:[id number]  PCP: Vivi Barrack, MD GYN:  Lavina Hamman SU: Claud Kelp OTHER MD: Lurline Hare   HISTORY OF PRESENT ILLNESS: Tabitha Ramirez tells me she felt a mass in her right breast about 3 months ago but initially ignored it. She eventually brought it to her physician's attention and was set up for bilateral diagnostic mammography at the breast Center 01/31/2013. This showed an area of distortion in the upper outer quadrant measuring approximately 4.3 cm. Diffuse calcifications in both breasts were stable. The mass was palpable at 12:00, 3 cm from the nipple, and by ultrasound it was irregular, hypoechoic, and measured 2.5 cm. In addition there was prominent right axillary adenopathy the largest lymph node measuring 2.5 cm.  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 Biopsy of the lymph node was negative. It did show some lymph node tissue.  Bilateral breast MRI 02/10/2013 showed a ring-enhancing necrotic-appearing 3.4 cm mass in the anterior third of the right breast. There was also a 2.0 cm level one right axillary lymph node that was prominent, but maintains central fat. The left breast the left axilla were benign and there was no internal mammary adenopathy noted.  The patient's subsequent history is as detailed below.  INTERVAL HISTORY: Tabitha Ramirez returns today accompanied by her daughter Tabitha Ramirez for followup of her locally advanced right breast carcinoma. She's currently day 8 cycle 2 of 4 planned dose dense cycles of doxorubicin/cyclophosphamide being given in the neoadjuvant setting. She receives Neulasta on day 2 for granulocyte support.   She continues to tolerate treatment well, with only a few concerns today. She's noticed that her nail beds are darkening, but denies any drainage or pain. Her mouth is  tender, and her throat is a little sore, although she denies any actual ulcerations. She's been utilizing nystatin mouthwash.   REVIEW OF SYSTEMS: Tabitha Ramirez denies any fevers or chills. She's had no additional skin changes and denies any abnormal bleeding. She's eating and drinking fairly well, and has had no nausea or change in bowel or bladder habits. She has no cough, shortness of breath, or chest pain. Her energy level is fair. Some days are better than others. She's had no abnormal headaches or dizziness, and currently denies any unusual myalgias, arthralgias, bony pain, or peripheral swelling.  A detailed review of systems is otherwise stable and noncontributory.    PAST MEDICAL HISTORY: Past Medical History  Diagnosis Date  . Heart murmur   . Hypertension   . Anemia   . Allergy   . Arthritis   . Breast cancer   . Migraines     PAST SURGICAL HISTORY: Past Surgical History  Procedure Laterality Date  . Abdominal hysterectomy      With bilateral salpingo-oophorectomy  . Portacath placement N/A 03/10/2013    Procedure: INSERTION PORT-A-CATH WITH FLUOROSCOPY AND ULTRASOUND;  Surgeon: Ernestene Mention, MD;  Location: [facility] OR;  Service: General;  Laterality: N/A;  . Axillary lymph node biopsy Right 03/10/2013    Procedure: AXILLARY LYMPH NODE BIOPSY;  Surgeon: Ernestene Mention, MD;  Location: [facility] OR;  Service: General;  Laterality: Right;  sentinel node with blue dye    FAMILY HISTORY Family History  Problem Relation Age of Onset  . Cancer Mother   . Congestive Heart Failure Mother   . Cancer Brother   .  Diabetes Brother   . Heart disease Brother     AMI 05/22/2012  . Congestive Heart Failure Brother   . Cancer Maternal Grandmother    the patient's father died in an automobile accident at the age of 25. The patient's mother lived to be 82. Tabitha Ramirez has 3 brothers, no sisters. One brother had colon cancer at the age of 81. The patient's mother and the patient's mothers mother (maternal  grandmother) both were diagnosed with some kind of cancer at the age of 44 or thereabouts, but Tabitha Ramirez does not know what type of cancer this may have been.  GYNECOLOGIC HISTORY:  Menarche age 53, first live birth age 74. The patient had her hysterectomy in the 1970s. She has been on estrogen replacement for she thinks 18 years.  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 She is divorced, lives by herself, with no pets. Her first child was given up for adoption. Her second child, Tabitha Ramirez, is a Geophysicist/field seismologist and is working towards a PhD at [employer]. Tabitha Ramirez has no grandchildren. She attends new [employer]    ADVANCED DIRECTIVES: In place. The patient has named her daughter Tabitha Ramirez as her healthcare power of attorney. Tabitha Ramirez can be reached at [phone]. The healthcare power of attorney documents were brought by the patient 03/13/2013 and are separately scanned.   HEALTH MAINTENANCE: History  Substance Use Topics  . Smoking status: Former Smoker    Types: Cigarettes  . Smokeless tobacco: Never Used     Comment: smoked in college 1 yr /occ wine  . Alcohol Use: No     Colonoscopy: August 2012  PAP:  Bone density: Never  Lipid panel:  Allergies  Allergen Reactions  . Penicillins Rash    Current Outpatient Prescriptions  Medication Sig Dispense Refill  . acetaminophen (TYLENOL) 500 MG tablet Take 500 mg by mouth every 6 (six) hours as needed for pain. For pain      . cephALEXin (KEFLEX) 500 MG capsule Take 1 capsule (500 mg total) by mouth 2 (two) times daily.  14 capsule  4  . dexamethasone (DECADRON) 4 MG tablet Take 2 tablets (8 mg total) by mouth 2 (two) times daily with a meal.  24 tablet  3  . HYDROcodone-acetaminophen (NORCO/VICODIN) 5-325 MG per tablet Take 1-2 tablets by mouth every 4 (four) hours as needed for pain.  40 tablet  1  .  lidocaine-prilocaine (EMLA) cream Apply topically as needed. Apply to port area 1-2 hours before chemotherapy; cover with plastioc wrap  30 g  2  . LORazepam (ATIVAN) 0.5 MG tablet Take 1 tablet (0.5 mg total) by mouth at bedtime as needed for anxiety.  20 tablet  0  . Misc Natural Products (WHITE WILLOW BARK PO) Take 400 mg by mouth daily.      . Multiple Vitamin (MULTIVITAMIN WITH MINERALS) TABS Take 1 tablet by mouth daily.      Marland Kitchen nystatin (MYCOSTATIN) 100000 UNIT/ML suspension Take 5 mLs (500,000 Units total) by mouth 4 (four) times daily.  180 mL  0  . OVER THE COUNTER MEDICATION Take by mouth 1 day or 1 dose. Miracle 2000 vit liquid      . PREMARIN 0.3 MG tablet       . prochlorperazine (COMPAZINE) 10 MG tablet Take 1 tablet (10 mg total) by mouth every 6 (six) hours as needed.  30 tablet  3  . triamterene-hydrochlorothiazide (MAXZIDE-25) 37.5-25 MG per tablet Take 1 tablet by mouth daily.  30 tablet  3  . fluconazole (DIFLUCAN) 100 MG tablet 2 tabs by mouth x 1 day, then 1 tab by mouth daily  8 tablet  3   No current facility-administered medications for this visit.    OBJECTIVE: Middle-aged [place] American woman in no acute distress Filed Vitals:   04/07/13 1518  BP: 112/69  Pulse: 97  Temp: 98.3 F (36.8 C)  Resp: 20     Body mass index is 26.3 kg/(m^2).    ECOG FS: 1 Filed Weights   04/07/13 1518  Weight: 153 lb 4.8 oz (69.536 kg)    Sclerae unicteric Oropharynx notable for white patches bilaterally in the buccal mucosa, and a white coating on the tongue. No oral ulcerations noted. No cervical or supraclavicular adenopathy Lungs clear to auscultation bilaterally, good excursion Heart regular rate and rhythm Abdomen soft, nontender to palpation, positive bowel sounds MSK no focal spinal tenderness, no peripheral edema Neuro: nonfocal, well oriented, appropriate affect Breasts: Deferred. Axillae benign bilaterally with no palpable adenopathy. Port is intact in the left  upper chest wall with no erythema, edema, or evidence of infection. There is some mild hyperpigmentation of the nailbeds bilaterally in the thumbs and  index fingers    LAB RESULTS:   Lab Results  Component Value Date   WBC 2.6* 04/07/2013   NEUTROABS 1.5 04/07/2013   HGB 9.4* 04/07/2013   HCT 27.3* 04/07/2013   MCV 95.4 04/07/2013   PLT 152 04/07/2013      Chemistry      Component Value Date/Time   NA 139 03/31/2013 0800   NA 139 02/28/2013 0937   K 3.5 03/31/2013 0800   K 3.5 02/28/2013 0937   CL 100 03/13/2013 0918   CL 101 02/28/2013 0937   CO2 28 03/31/2013 0800   CO2 29 02/28/2013 0937   BUN 11.6 03/31/2013 0800   BUN 17 02/28/2013 0937   CREATININE 0.9 03/31/2013 0800   CREATININE 1.00 02/28/2013 0937   CREATININE 0.94 11/29/2011 1535      Component Value Date/Time   CALCIUM 9.6 03/31/2013 0800   CALCIUM 9.5 02/28/2013 0937   ALKPHOS 121 03/31/2013 0800   ALKPHOS 96 02/28/2013 0937   AST 23 03/31/2013 0800   AST 27 02/28/2013 0937   ALT 29 03/31/2013 0800   ALT 27 02/28/2013 0937   BILITOT 0.24 03/31/2013 0800   BILITOT 0.3 02/28/2013 4098       STUDIES:   Dg Chest Port 1 View  March 29, 2013   *RADIOLOGY REPORT*  Clinical Data: Port-A-Cath insertion.  Breast cancer.  PORTABLE CHEST - 1 VIEW  Comparison: 02/28/2013.  Findings: Poor inspiration.  Enlarged cardiac silhouette with an interval increase in size, magnified by the poor inspiration and portable AP technique.  Mild increase in prominence of the pulmonary vasculature and interstitial markings, also accentuated by the poor inspiration.  Interval left subclavian porta catheter with its tip in the upper right atrium.  No pneumothorax.  Stable mild to moderate scoliosis.  IMPRESSION:  1.  Left subclavian porta catheter tip in the right atrium. 2.  No pneumothorax. 3.  Interval cardiomegaly, pulmonary vascular congestion and possible mild interstitial pulmonary edema, all accentuated by the poor inspiration.   Original Report Authenticated By: Beckie Salts,  M.D.      ASSESSMENT: 67 y.o. [place] woman status post right breast biopsy 01/31/2013 for a clinical T2 NX,  stage II invasive ductal carcinoma, grade 3, triple negative, with an MIB-1 of 84%  (1) biopsy of an enlarged (2.5 cm) right axillary lymph node 01/31/2013 was negative, felt possibly discordant  (2) sentinel lymph node sampling 03/10/2013 showed one of two sentinel nodes to be positive; repeat prognostic profile pending  (3) neoadjuvant doxorubicin and cyclophosphamide to be given in dose dense fashion with Neulasta support starting 03/17/2013, to be followed by weekly carboplatin and paclitaxel x12   PLAN: Kambry continues to tolerate treatment well. She has developed oropharyngeal candidiasis, and am starting her on fluconazole for 7 days. She'll see Dr. Darnelle Catalan next week for labs and physical exam on July 21 prior to her third cycle of doxorubicin/cyclophosphamide.  We'll plan on repeating a breast MRI in early August after she completes 4 cycles of neoadjuvant chemotherapy. She is already scheduled to meet with Dr. Derrell Lolling soon thereafter. After 4 cycles of doxorubicin/cyclophosphamide we will be initiating weekly carboplatin/paclitaxel, first dose being scheduled for August 18.  All this was again reviewed with [name] and Elon Jester today, both of whom voice understanding and agreement. Call with any changes or problems prior to her next appointment.  Zollie Scale, PA-C   04/07/2013 3:43 PM

## 2013-04-07 NOTE — Telephone Encounter (Signed)
Per staff message and POF I have tried to schedule appts, MD visits to late in the day for treatment. Schedule sent to scheduler for new appts.   JMW  

## 2013-04-07 NOTE — Telephone Encounter (Signed)
appts was made and printed. Pt is aware that her tx's will be added. i emailed MW to add her tx....td

## 2013-04-08 ENCOUNTER — Inpatient Hospital Stay: Admission: RE | Admit: 2013-04-08 | Payer: Medicare Other | Source: Ambulatory Visit

## 2013-04-08 ENCOUNTER — Telehealth: Payer: Self-pay | Admitting: *Deleted

## 2013-04-08 NOTE — Telephone Encounter (Signed)
Per staff phone call and POF I have schedueld appts.  JMW  

## 2013-04-14 ENCOUNTER — Ambulatory Visit (HOSPITAL_BASED_OUTPATIENT_CLINIC_OR_DEPARTMENT_OTHER): Payer: Medicare Other | Admitting: Oncology

## 2013-04-14 ENCOUNTER — Other Ambulatory Visit (HOSPITAL_BASED_OUTPATIENT_CLINIC_OR_DEPARTMENT_OTHER): Payer: Medicare Other | Admitting: Lab

## 2013-04-14 ENCOUNTER — Ambulatory Visit (HOSPITAL_BASED_OUTPATIENT_CLINIC_OR_DEPARTMENT_OTHER): Payer: Medicare Other

## 2013-04-14 VITALS — BP 117/70 | HR 91 | Temp 98.7°F | Resp 20 | Ht 64.0 in | Wt 151.9 lb

## 2013-04-14 DIAGNOSIS — C50119 Malignant neoplasm of central portion of unspecified female breast: Secondary | ICD-10-CM

## 2013-04-14 DIAGNOSIS — Z171 Estrogen receptor negative status [ER-]: Secondary | ICD-10-CM

## 2013-04-14 DIAGNOSIS — C50919 Malignant neoplasm of unspecified site of unspecified female breast: Secondary | ICD-10-CM

## 2013-04-14 DIAGNOSIS — Z5111 Encounter for antineoplastic chemotherapy: Secondary | ICD-10-CM

## 2013-04-14 DIAGNOSIS — C50111 Malignant neoplasm of central portion of right female breast: Secondary | ICD-10-CM

## 2013-04-14 LAB — CBC WITH DIFFERENTIAL/PLATELET
BASO%: 0.8 % (ref 0.0–2.0)
EOS%: 0 % (ref 0.0–7.0)
MCH: 32 pg (ref 25.1–34.0)
MCHC: 33.5 g/dL (ref 31.5–36.0)
MCV: 95.6 fL (ref 79.5–101.0)
MONO%: 5.2 % (ref 0.0–14.0)
RBC: 2.97 10*6/uL — ABNORMAL LOW (ref 3.70–5.45)
RDW: 14.6 % — ABNORMAL HIGH (ref 11.2–14.5)

## 2013-04-14 MED ORDER — DOXORUBICIN HCL CHEMO IV INJECTION 2 MG/ML
60.0000 mg/m2 | Freq: Once | INTRAVENOUS | Status: AC
  Administered 2013-04-14: 108 mg via INTRAVENOUS
  Filled 2013-04-14: qty 54

## 2013-04-14 MED ORDER — SODIUM CHLORIDE 0.9 % IV SOLN
Freq: Once | INTRAVENOUS | Status: AC
  Administered 2013-04-14: 12:00:00 via INTRAVENOUS

## 2013-04-14 MED ORDER — DEXAMETHASONE SODIUM PHOSPHATE 20 MG/5ML IJ SOLN
12.0000 mg | Freq: Once | INTRAMUSCULAR | Status: AC
  Administered 2013-04-14: 12 mg via INTRAVENOUS

## 2013-04-14 MED ORDER — PALONOSETRON HCL INJECTION 0.25 MG/5ML
0.2500 mg | Freq: Once | INTRAVENOUS | Status: AC
  Administered 2013-04-14: 0.25 mg via INTRAVENOUS

## 2013-04-14 MED ORDER — SODIUM CHLORIDE 0.9 % IJ SOLN
10.0000 mL | INTRAMUSCULAR | Status: DC | PRN
  Administered 2013-04-14: 10 mL
  Filled 2013-04-14: qty 10

## 2013-04-14 MED ORDER — SODIUM CHLORIDE 0.9 % IV SOLN
150.0000 mg | Freq: Once | INTRAVENOUS | Status: AC
  Administered 2013-04-14: 150 mg via INTRAVENOUS
  Filled 2013-04-14: qty 5

## 2013-04-14 MED ORDER — HEPARIN SOD (PORK) LOCK FLUSH 100 UNIT/ML IV SOLN
500.0000 [IU] | Freq: Once | INTRAVENOUS | Status: AC | PRN
  Administered 2013-04-14: 500 [IU]
  Filled 2013-04-14: qty 5

## 2013-04-14 MED ORDER — SODIUM CHLORIDE 0.9 % IV SOLN
600.0000 mg/m2 | Freq: Once | INTRAVENOUS | Status: AC
  Administered 2013-04-14: 1080 mg via INTRAVENOUS
  Filled 2013-04-14: qty 54

## 2013-04-14 NOTE — Progress Notes (Signed)
ID: [name] OB: 12/15/1945  MR#: [id number]  CSN#:[id number]  PCP: Vivi Barrack, MD GYN:  Lavina Hamman SU: Claud Kelp OTHER MD: Lurline Hare   HISTORY OF PRESENT ILLNESS: Tabitha Ramirez tells me she felt a mass in her right breast about 3 months ago but initially ignored it. She eventually brought it to her physician's attention and was set up for bilateral diagnostic mammography at the breast Center 01/31/2013. This showed an area of distortion in the upper outer quadrant measuring approximately 4.3 cm. Diffuse calcifications in both breasts were stable. The mass was palpable at 12:00, 3 cm from the nipple, and by ultrasound it was irregular, hypoechoic, and measured 2.5 cm. In addition there was prominent right axillary adenopathy the largest lymph node measuring 2.5 cm.  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 Biopsy of the lymph node was negative. It did show some lymph node tissue.  Bilateral breast MRI 02/10/2013 showed a ring-enhancing necrotic-appearing 3.4 cm mass in the anterior third of the right breast. There was also a 2.0 cm level one right axillary lymph node that was prominent, but maintains central fat. The left breast the left axilla were benign and there was no internal mammary adenopathy noted.  The patient's subsequent history is as detailed below.  INTERVAL HISTORY: Sameka returns today accompanied by her daughter [name] for followup of her locally advanced right breast carcinoma. She's currently day 1 cycle 3 of 4 planned dose dense cycles of doxorubicin/cyclophosphamide being given in the neoadjuvant setting. She receives Neulasta on day 2 for granulocyte support.   REVIEW OF SYSTEMS: Tabitha Ramirez has had no problems with nausea or vomiting. She has mild sinus symptoms a a little hoarseness. Her Right elbow hurts, but isn't swollen or  red. She has had some sharp but very brief pains in the R breast x 2 this past week. Her son in law got her an exercise bike and she uses that 5 min  at a time. Otherwise a detailed review of systems was stable   PAST MEDICAL HISTORY: Past Medical History  Diagnosis Date  . Heart murmur   . Hypertension   . Anemia   . Allergy   . Arthritis   . Breast cancer   . Migraines     PAST SURGICAL HISTORY: Past Surgical History  Procedure Laterality Date  . Abdominal hysterectomy      With bilateral salpingo-oophorectomy  . Portacath placement N/A 03/10/2013    Procedure: INSERTION PORT-A-CATH WITH FLUOROSCOPY AND ULTRASOUND;  Surgeon: Ernestene Mention, MD;  Location: [facility] OR;  Service: General;  Laterality: N/A;  . Axillary lymph node biopsy Right 03/10/2013    Procedure: AXILLARY LYMPH NODE BIOPSY;  Surgeon: Ernestene Mention, MD;  Location: [facility] OR;  Service: General;  Laterality: Right;  sentinel node with blue dye    FAMILY HISTORY Family History  Problem Relation Age of Onset  . Cancer Mother   . Congestive Heart Failure Mother   . Cancer Brother   . Diabetes Brother   . Heart disease Brother     AMI 05/22/2012  . Congestive Heart Failure Brother   . Cancer Maternal Grandmother    the patient's father died in an automobile accident at the age of 31. The patient's mother lived to be 69. Buena has 3 brothers, no sisters. One brother had colon cancer at the age of  31. The patient's mother and the patient's mothers mother (maternal grandmother) both were diagnosed with some kind of cancer at the age of 110 or thereabouts, but Heavan does not know what type of cancer this may have been.  GYNECOLOGIC HISTORY:  Menarche age 53, first live birth age 45. The patient had her hysterectomy in the 1970s. She has been on estrogen replacement for she thinks 18 years.  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 She is  divorced, lives by herself, with no pets. Her first child was given up for adoption. Her second child, [name], is a Geophysicist/field seismologist and is working towards a PhD at [employer]. Shadavia has no grandchildren. She attends new [employer]    ADVANCED DIRECTIVES: In place. The patient has named her daughter [name] as her healthcare power of attorney. [name] can be reached at [phone]. The healthcare power of attorney documents were brought by the patient 03/13/2013 and are separately scanned.   HEALTH MAINTENANCE: History  Substance Use Topics  . Smoking status: Former Smoker    Types: Cigarettes  . Smokeless tobacco: Never Used     Comment: smoked in college 1 yr /occ wine  . Alcohol Use: No     Colonoscopy: August 2012  PAP:  Bone density: Never  Lipid panel:  Allergies  Allergen Reactions  . Penicillins Rash    Current Outpatient Prescriptions  Medication Sig Dispense Refill  . acetaminophen (TYLENOL) 500 MG tablet Take 500 mg by mouth every 6 (six) hours as needed for pain. For pain      . cephALEXin (KEFLEX) 500 MG capsule Take 1 capsule (500 mg total) by mouth 2 (two) times daily.  14 capsule  4  . dexamethasone (DECADRON) 4 MG tablet Take 2 tablets (8 mg total) by mouth 2 (two) times daily with a meal.  24 tablet  3  . fluconazole (DIFLUCAN) 100 MG tablet 2 tabs by mouth x 1 day, then 1 tab by mouth daily  8 tablet  3  . HYDROcodone-acetaminophen (NORCO/VICODIN) 5-325 MG per tablet Take 1-2 tablets by mouth every 4 (four) hours as needed for pain.  40 tablet  1  . lidocaine-prilocaine (EMLA) cream Apply topically as needed. Apply to port area 1-2 hours before chemotherapy; cover with plastioc wrap  30 g  2  . LORazepam (ATIVAN) 0.5 MG tablet Take 1 tablet (0.5 mg total) by mouth at bedtime as needed for anxiety.  20 tablet  0  . Misc Natural Products (WHITE WILLOW BARK PO) Take 400 mg by mouth daily.      . Multiple  Vitamin (MULTIVITAMIN WITH MINERALS) TABS Take 1 tablet by mouth daily.      Marland Kitchen nystatin (MYCOSTATIN) 100000 UNIT/ML suspension Take 5 mLs (500,000 Units total) by mouth 4 (four) times daily.  180 mL  0  . OVER THE COUNTER MEDICATION Take by mouth 1 day or 1 dose. Miracle 2000 vit liquid      . PREMARIN 0.3 MG tablet       . prochlorperazine (COMPAZINE) 10 MG tablet Take 1 tablet (10 mg total) by mouth every 6 (six) hours as needed.  30 tablet  3  . triamterene-hydrochlorothiazide (MAXZIDE-25) 37.5-25 MG per tablet Take 1 tablet by mouth daily.  30 tablet  3   No current facility-administered medications for this visit.    OBJECTIVE: Middle-aged [place] American  woman who appears anxious  Filed Vitals:   04/14/13 0929  BP: 117/70  Pulse: 91  Temp: 98.7 F (37.1 C)  Resp: 20     Body mass index is 26.06 kg/(m^2).    ECOG FS: 1 Filed Weights   04/14/13 0929  Weight: 151 lb 14.4 oz (68.901 kg)    Sclerae unicteric Oropharynx clear No cervical or supraclavicular adenopathy Lungs clear to auscultation, good excursion bilaterally Heart regular rate and rhythm Abdomen soft, nontender, positive bowel sounds MSK no focal spinal tenderness, no RUE edema, mild tenderness R elbow w/o erythema or swelling Neuro: nonfocal, well oriented, appropriate affect Breasts: Deferred.  Port is intact in the left upper chest wall . There is some mild hyperpigmentation of the nailbeds as expected    LAB RESULTS:   Lab Results  Component Value Date   WBC 2.6* 04/07/2013   NEUTROABS 1.5 04/07/2013   HGB 9.4* 04/07/2013   HCT 27.3* 04/07/2013   MCV 95.4 04/07/2013   PLT 152 04/07/2013      Chemistry      Component Value Date/Time   NA 139 03/31/2013 0800   NA 139 02/28/2013 0937   K 3.5 03/31/2013 0800   K 3.5 02/28/2013 0937   CL 100 03/13/2013 0918   CL 101 02/28/2013 0937   CO2 28 03/31/2013 0800   CO2 29 02/28/2013 0937   BUN 11.6 03/31/2013 0800   BUN 17 02/28/2013 0937   CREATININE 0.9 03/31/2013 0800    CREATININE 1.00 02/28/2013 0937   CREATININE 0.94 11/29/2011 1535      Component Value Date/Time   CALCIUM 9.6 03/31/2013 0800   CALCIUM 9.5 02/28/2013 0937   ALKPHOS 121 03/31/2013 0800   ALKPHOS 96 02/28/2013 0937   AST 23 03/31/2013 0800   AST 27 02/28/2013 0937   ALT 29 03/31/2013 0800   ALT 27 02/28/2013 0937   BILITOT 0.24 03/31/2013 0800   BILITOT 0.3 02/28/2013 0937       STUDIES: No results found.   ASSESSMENT: 67 y.o. [place] woman status post right central breast biopsy 01/31/2013 for a clinical T2 NX, stage II invasive ductal carcinoma, grade 3, triple negative, with an MIB-1 of 84%  (1) biopsy of an enlarged (2.5 cm) right axillary lymph node 01/31/2013 was negative, felt possibly discordant  (2) sentinel lymph node sampling 03/10/2013 showed one of two sentinel nodes to be positive; repeat prognostic profile pending  (3) neoadjuvant doxorubicin and cyclophosphamide being given in dose dense fashion with Neulasta support starting 03/17/2013, to be followed by weekly carboplatin and paclitaxel x12   PLAN: Arnetha will proceed to cycle 3 of chemotherapy today. I encouraged her to use her new exercise bike frequently even if briefly, and also to ambulate regularly. She will take aleve as needed for her R elbow pain, and call us if it does nto improve over the next 48 hours or so. Otherwise she will return as scheduled and once she completes her 4 cycles of AC we will repeat a breast MRI to assess initial response  Skylor Schnapp C, MD   04/14/2013 9:56 AM

## 2013-04-14 NOTE — Patient Instructions (Signed)
[facility] Discharge Instructions for Patients Receiving Chemotherapy  Today you received the following chemotherapy agents: Adriamycin and Cytoxan. To help prevent nausea and vomiting after your treatment, we encourage you to take your nausea medication, Compazine. Take it every six hours as needed.   If you develop nausea and vomiting that is not controlled by your nausea medication, call the clinic.   BELOW ARE SYMPTOMS THAT SHOULD BE REPORTED IMMEDIATELY:  *FEVER GREATER THAN 100.5 F  *CHILLS WITH OR WITHOUT FEVER  NAUSEA AND VOMITING THAT IS NOT CONTROLLED WITH YOUR NAUSEA MEDICATION  *UNUSUAL SHORTNESS OF BREATH  *UNUSUAL BRUISING OR BLEEDING  TENDERNESS IN MOUTH AND THROAT WITH OR WITHOUT PRESENCE OF ULCERS  *URINARY PROBLEMS  *BOWEL PROBLEMS  UNUSUAL RASH Items with * indicate a potential emergency and should be followed up as soon as possible.  Feel free to call the clinic should you have any questions or concerns. The clinic phone number is [phone].

## 2013-04-15 ENCOUNTER — Ambulatory Visit (HOSPITAL_BASED_OUTPATIENT_CLINIC_OR_DEPARTMENT_OTHER): Payer: Medicare Other

## 2013-04-15 VITALS — BP 123/66 | HR 87 | Temp 98.1°F

## 2013-04-15 DIAGNOSIS — Z5189 Encounter for other specified aftercare: Secondary | ICD-10-CM

## 2013-04-15 DIAGNOSIS — C50111 Malignant neoplasm of central portion of right female breast: Secondary | ICD-10-CM

## 2013-04-15 DIAGNOSIS — C50119 Malignant neoplasm of central portion of unspecified female breast: Secondary | ICD-10-CM

## 2013-04-15 MED ORDER — PEGFILGRASTIM INJECTION 6 MG/0.6ML
6.0000 mg | Freq: Once | SUBCUTANEOUS | Status: AC
  Administered 2013-04-15: 6 mg via SUBCUTANEOUS
  Filled 2013-04-15: qty 0.6

## 2013-04-21 ENCOUNTER — Telehealth: Payer: Self-pay | Admitting: *Deleted

## 2013-04-21 ENCOUNTER — Encounter: Payer: Self-pay | Admitting: Physician Assistant

## 2013-04-21 ENCOUNTER — Ambulatory Visit (HOSPITAL_BASED_OUTPATIENT_CLINIC_OR_DEPARTMENT_OTHER): Payer: Medicare Other | Admitting: Physician Assistant

## 2013-04-21 ENCOUNTER — Other Ambulatory Visit (HOSPITAL_BASED_OUTPATIENT_CLINIC_OR_DEPARTMENT_OTHER): Payer: Medicare Other | Admitting: Lab

## 2013-04-21 VITALS — BP 117/64 | HR 101 | Temp 98.4°F | Resp 20 | Ht 64.0 in | Wt 149.7 lb

## 2013-04-21 DIAGNOSIS — C50111 Malignant neoplasm of central portion of right female breast: Secondary | ICD-10-CM

## 2013-04-21 DIAGNOSIS — C50119 Malignant neoplasm of central portion of unspecified female breast: Secondary | ICD-10-CM

## 2013-04-21 DIAGNOSIS — D649 Anemia, unspecified: Secondary | ICD-10-CM

## 2013-04-21 DIAGNOSIS — R197 Diarrhea, unspecified: Secondary | ICD-10-CM

## 2013-04-21 LAB — CBC WITH DIFFERENTIAL/PLATELET
BASO%: 0.1 % (ref 0.0–2.0)
LYMPH%: 23 % (ref 14.0–49.7)
MCHC: 34.8 g/dL (ref 31.5–36.0)
MONO#: 0.1 10*3/uL (ref 0.1–0.9)
MONO%: 5.7 % (ref 0.0–14.0)
Platelets: 122 10*3/uL — ABNORMAL LOW (ref 145–400)
RBC: 2.59 10*6/uL — ABNORMAL LOW (ref 3.70–5.45)
WBC: 2.3 10*3/uL — ABNORMAL LOW (ref 3.9–10.3)

## 2013-04-21 LAB — COMPREHENSIVE METABOLIC PANEL (CC13)
ALT: 21 U/L (ref 0–55)
Alkaline Phosphatase: 132 U/L (ref 40–150)
CO2: 27 mEq/L (ref 22–29)
Sodium: 143 mEq/L (ref 136–145)
Total Bilirubin: 0.74 mg/dL (ref 0.20–1.20)
Total Protein: 6.8 g/dL (ref 6.4–8.3)

## 2013-04-21 NOTE — Telephone Encounter (Signed)
Per staff message and POF I have scheduled appts.  JMW  

## 2013-04-21 NOTE — Progress Notes (Signed)
ID: [name] OB: 11-Dec-1945  MR#: [id number]  CSN#:[id number]  PCP: Vivi Barrack, MD GYN:  Lavina Hamman SU: Claud Kelp OTHER MD: Lurline Hare   HISTORY OF PRESENT ILLNESS: Jannel tells me she felt a mass in her right breast about 3 months ago but initially ignored it. She eventually brought it to her physician's attention and was set up for bilateral diagnostic mammography at the breast Center 01/31/2013. This showed an area of distortion in the upper outer quadrant measuring approximately 4.3 cm. Diffuse calcifications in both breasts were stable. The mass was palpable at 12:00, 3 cm from the nipple, and by ultrasound it was irregular, hypoechoic, and measured 2.5 cm. In addition there was prominent right axillary adenopathy the largest lymph node measuring 2.5 cm.  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 Biopsy of the lymph node was negative. It did show some lymph node tissue.  Bilateral breast MRI 02/10/2013 showed a ring-enhancing necrotic-appearing 3.4 cm mass in the anterior third of the right breast. There was also a 2.0 cm level one right axillary lymph node that was prominent, but maintains central fat. The left breast the left axilla were benign and there was no internal mammary adenopathy noted.  The patient's subsequent history is as detailed below.  INTERVAL HISTORY: Samarra returns today accompanied by her daughter [name] for followup of her locally advanced right breast carcinoma. She is currently day 8 cycle 3 of 4 planned dose dense cycles of doxorubicin/cyclophosphamide being given in the neoadjuvant setting. She receives Neulasta on day 2 for granulocyte support.   Other Charlottie tells me she is "feeling okay", she did have several concerns to discuss today. She's been having diarrhea for several days, proximally 5-6 days, and is  having an average of 4 watery stools daily. She actually has had no bowel movement today, so perhaps this is improving. She has not been taking Imodium. She is trying to keep herself well hydrated. Her rectal area is sore, and she has been applying Vaseline for protection. She denies any blood or mucus in the stool, has had no fevers or chills, and denies any abdominal pain.  Willadean is also having some continued jaw pain, specifically in the temporomandibular joints bilaterally. This primarily bothers her when she opens her mouth wide, or when she chews for a long period of time. She denies any dental pain.  REVIEW OF SYSTEMS: Farida's energy level is fair, and despite her hemoglobin of 8.7 today, she does not seem particularly symptomatic of her anemia. She denies any shortness of breath, even with exertion. She still able to maintain her day-to-day activities. She is sleeping well. Her appetite is fair. Her mouth feels slightly tender. She denies any ulcerations. She's had no nausea or emesis. She denies any cough, shortness of breath, chest pain, or palpitations. She's had no abnormal headaches or dizziness. She currently denies any unusual myalgias, arthralgias, or bony pain, and has had no peripheral swelling.  A detailed review of systems is otherwise noncontributory.    PAST MEDICAL HISTORY: Past Medical History  Diagnosis Date  . Heart murmur   . Hypertension   . Anemia   . Allergy   . Arthritis   . Breast cancer   . Migraines     PAST SURGICAL HISTORY: Past Surgical History  Procedure Laterality Date  . Abdominal hysterectomy  With bilateral salpingo-oophorectomy  . Portacath placement N/A 03/10/2013    Procedure: INSERTION PORT-A-CATH WITH FLUOROSCOPY AND ULTRASOUND;  Surgeon: Ernestene Mention, MD;  Location: [facility] OR;  Service: General;  Laterality: N/A;  . Axillary lymph node biopsy Right 03/10/2013    Procedure: AXILLARY LYMPH NODE BIOPSY;  Surgeon: Ernestene Mention, MD;   Location: [facility] OR;  Service: General;  Laterality: Right;  sentinel node with blue dye    FAMILY HISTORY Family History  Problem Relation Age of Onset  . Cancer Mother   . Congestive Heart Failure Mother   . Cancer Brother   . Diabetes Brother   . Heart disease Brother     AMI 05/22/2012  . Congestive Heart Failure Brother   . Cancer Maternal Grandmother    the patient's father died in an automobile accident at the age of 41. The patient's mother lived to be 60. Favour has 3 brothers, no sisters. One brother had colon cancer at the age of 30. The patient's mother and the patient's mothers mother (maternal grandmother) both were diagnosed with some kind of cancer at the age of 47 or thereabouts, but Lya does not know what type of cancer this may have been.  GYNECOLOGIC HISTORY:  Menarche age 85, first live birth age 52. The patient had her hysterectomy in the 1970s. She has been on estrogen replacement for she thinks 18 years.  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 She is divorced, lives by herself, with no pets. Her first child was given up for adoption. Her second child, [name], is a Geophysicist/field seismologist and is working towards a PhD at [employer]. Shanicka has no grandchildren. She attends new [employer]    ADVANCED DIRECTIVES: In place. The patient has named her daughter [name] as her healthcare power of attorney. [name] can be reached at [phone]. The healthcare power of attorney documents were brought by the patient 03/13/2013 and are separately scanned.   HEALTH MAINTENANCE: History  Substance Use Topics  . Smoking status: Former Smoker    Types: Cigarettes  . Smokeless tobacco: Never Used     Comment: smoked in college 1 yr /occ wine  . Alcohol Use: No     Colonoscopy: August 2012  PAP:  Bone density: Never  Lipid panel:  Allergies   Allergen Reactions  . Penicillins Rash    Current Outpatient Prescriptions  Medication Sig Dispense Refill  . acetaminophen (TYLENOL) 500 MG tablet Take 500 mg by mouth every 6 (six) hours as needed for pain. For pain      . dexamethasone (DECADRON) 4 MG tablet Take 2 tablets (8 mg total) by mouth 2 (two) times daily with a meal.  24 tablet  3  . fluconazole (DIFLUCAN) 100 MG tablet 2 tabs by mouth x 1 day, then 1 tab by mouth daily  8 tablet  3  . HYDROcodone-acetaminophen (NORCO/VICODIN) 5-325 MG per tablet Take 1-2 tablets by mouth every 4 (four) hours as needed for pain.  40 tablet  1  . lidocaine-prilocaine (EMLA) cream Apply topically as needed. Apply to port area 1-2 hours before chemotherapy; cover with plastioc wrap  30 g  2  . LORazepam (ATIVAN) 0.5 MG tablet Take 1 tablet (0.5 mg total) by mouth at bedtime as needed for anxiety.  20 tablet  0  . nystatin (MYCOSTATIN) 100000 UNIT/ML suspension       .  OVER THE COUNTER MEDICATION Take by mouth 1 day or 1 dose. Miracle 2000 vit liquid      . prochlorperazine (COMPAZINE) 10 MG tablet Take 1 tablet (10 mg total) by mouth every 6 (six) hours as needed.  30 tablet  3  . triamterene-hydrochlorothiazide (MAXZIDE-25) 37.5-25 MG per tablet Take 1 tablet by mouth daily.  30 tablet  3   No current facility-administered medications for this visit.    OBJECTIVE: Middle-aged [place] American woman who appears anxious  Filed Vitals:   04/21/13 0951  BP: 117/64  Pulse: 101  Temp: 98.4 F (36.9 C)  Resp: 20     Body mass index is 25.68 kg/(m^2).    ECOG FS: 1 Filed Weights   04/21/13 0951  Weight: 149 lb 11.2 oz (67.903 kg)   Sclerae unicteric Oropharynx notable for a white coating on the tongue and posterior buccal mucosa bilaterally. No ulcerations. Tongue is hyperpigmented, especially around the lateral edges. No cervical or supraclavicular adenopathy Lungs clear to auscultation, good excursion bilaterally Heart regular rate and  rhythm Abdomen soft, nontender, positive bowel sounds, slightly hyperactive MSK no focal spinal tenderness No peripheral edema Neuro: nonfocal, well oriented, appropriate affect Breasts: Deferred.  Port is intact in the left upper chest wall . There is some mild hyperpigmentation of the nailbeds and hands bilaterally, but with no drainage or evidence of infection.   LAB RESULTS:   Lab Results  Component Value Date   WBC 2.3* 04/21/2013   NEUTROABS 1.6 04/21/2013   HGB 8.7* 04/21/2013   HCT 25.0* 04/21/2013   MCV 96.2 04/21/2013   PLT 122* 04/21/2013      Chemistry      Component Value Date/Time   NA 143 04/21/2013 0934   NA 139 02/28/2013 0937   K 3.6 04/21/2013 0934   K 3.5 02/28/2013 0937   CL 100 03/13/2013 0918   CL 101 02/28/2013 0937   CO2 27 04/21/2013 0934   CO2 29 02/28/2013 0937   BUN 23.5 04/21/2013 0934   BUN 17 02/28/2013 0937   CREATININE 0.9 04/21/2013 0934   CREATININE 1.00 02/28/2013 0937   CREATININE 0.94 11/29/2011 1535      Component Value Date/Time   CALCIUM 9.5 04/21/2013 0934   CALCIUM 9.5 02/28/2013 0937   ALKPHOS 132 04/21/2013 0934   ALKPHOS 96 02/28/2013 0937   AST 15 04/21/2013 0934   AST 27 02/28/2013 0937   ALT 21 04/21/2013 0934   ALT 27 02/28/2013 0937   BILITOT 0.74 04/21/2013 0934   BILITOT 0.3 02/28/2013 0937       STUDIES: No results found.   ASSESSMENT: 67 y.o. [place] woman status post right central breast biopsy 01/31/2013 for a clinical T2 NX, stage II invasive ductal carcinoma, grade 3, triple negative, with an MIB-1 of 84%  (1) biopsy of an enlarged (2.5 cm) right axillary lymph node 01/31/2013 was negative, felt possibly discordant  (2) sentinel lymph node sampling 03/10/2013 showed one of two sentinel nodes to be positive; repeat prognostic profile pending  (3) neoadjuvant doxorubicin and cyclophosphamide being given in dose dense fashion with Neulasta support starting 03/17/2013, to be followed by weekly carboplatin and paclitaxel  x12   PLAN: Fortunately, Johnie appears to be recovering from this past cycle, and her diarrhea seems to be slowing down. I have encouraged Tasia's to keep herself well hydrated. She can take Imodium, one tablet after every 2 loose stools, but I have asked her to contact us if the diarrhea has  not completely resolved by tomorrow.   We'll continue to follow her hemoglobin closely, and can certainly consider blood transfusion if and when needed. She may increase the iron in her diet, but at this time does not need an oral iron supplement.  She'll also try taking Aleve, one tablet twice daily with food for 7 days, to see if this helps decrease the jaw pain she is experiencing. I also encouraged her to eat soft foods, nothing that has to be chewed for long periods of time, and of course she should avoid chewing gum as well. We will reassess the jaw pain when I see her next week, but I wonder if this might be secondary to Neulasta injections. She'll continue to use the nystatin rinse for mild oral thrush.  I will see Shaletha again next week prior to her fourth and final dose of doxorubicin/cyclophosphamide. She is already scheduled for repeat breast MRI on August 10 to assess response to neoadjuvant chemotherapy. All this was reviewed in detail with Benay and her daughter Elon Jester. They both voice understanding and agreement with our plan and will call with any changes or problems prior to her next appointment.   Tuwanda Vokes, PA-C   04/21/2013 11:02 AM

## 2013-04-21 NOTE — Telephone Encounter (Signed)
i tried to call the pt and inform her that she will hve several of tx's added to her schedule. However her vm is not set up yet...td

## 2013-04-27 ENCOUNTER — Encounter (HOSPITAL_COMMUNITY): Payer: Self-pay | Admitting: *Deleted

## 2013-04-27 ENCOUNTER — Inpatient Hospital Stay (HOSPITAL_COMMUNITY)
Admission: EM | Admit: 2013-04-27 | Discharge: 2013-04-30 | DRG: 812 | Disposition: A | Discharge status: Home / Self Care | Payer: Medicare Other | Attending: Internal Medicine | Admitting: Internal Medicine

## 2013-04-27 ENCOUNTER — Emergency Department (HOSPITAL_COMMUNITY): Payer: Medicare Other

## 2013-04-27 DIAGNOSIS — Z880 Allergy status to penicillin: Secondary | ICD-10-CM

## 2013-04-27 DIAGNOSIS — D630 Anemia in neoplastic disease: Secondary | ICD-10-CM | POA: Diagnosis present

## 2013-04-27 DIAGNOSIS — C50111 Malignant neoplasm of central portion of right female breast: Secondary | ICD-10-CM | POA: Diagnosis present

## 2013-04-27 DIAGNOSIS — I313 Pericardial effusion (noninflammatory): Secondary | ICD-10-CM

## 2013-04-27 DIAGNOSIS — D6481 Anemia due to antineoplastic chemotherapy: Secondary | ICD-10-CM | POA: Diagnosis present

## 2013-04-27 DIAGNOSIS — C50919 Malignant neoplasm of unspecified site of unspecified female breast: Secondary | ICD-10-CM | POA: Diagnosis present

## 2013-04-27 DIAGNOSIS — D649 Anemia, unspecified: Secondary | ICD-10-CM | POA: Diagnosis present

## 2013-04-27 DIAGNOSIS — I3139 Other pericardial effusion (noninflammatory): Secondary | ICD-10-CM

## 2013-04-27 DIAGNOSIS — I10 Essential (primary) hypertension: Secondary | ICD-10-CM | POA: Diagnosis present

## 2013-04-27 DIAGNOSIS — I319 Disease of pericardium, unspecified: Secondary | ICD-10-CM | POA: Diagnosis present

## 2013-04-27 DIAGNOSIS — R55 Syncope and collapse: Secondary | ICD-10-CM

## 2013-04-27 DIAGNOSIS — D126 Benign neoplasm of colon, unspecified: Secondary | ICD-10-CM | POA: Diagnosis present

## 2013-04-27 DIAGNOSIS — C50119 Malignant neoplasm of central portion of unspecified female breast: Secondary | ICD-10-CM | POA: Diagnosis present

## 2013-04-27 DIAGNOSIS — D62 Acute posthemorrhagic anemia: Principal | ICD-10-CM | POA: Diagnosis present

## 2013-04-27 DIAGNOSIS — T451X5A Adverse effect of antineoplastic and immunosuppressive drugs, initial encounter: Secondary | ICD-10-CM | POA: Diagnosis present

## 2013-04-27 DIAGNOSIS — D72829 Elevated white blood cell count, unspecified: Secondary | ICD-10-CM | POA: Diagnosis present

## 2013-04-27 DIAGNOSIS — Z87891 Personal history of nicotine dependence: Secondary | ICD-10-CM

## 2013-04-27 DIAGNOSIS — Z9221 Personal history of antineoplastic chemotherapy: Secondary | ICD-10-CM

## 2013-04-27 LAB — CBC WITH DIFFERENTIAL/PLATELET
Basophils Relative: 1 % (ref 0–1)
Eosinophils Absolute: 0 10*3/uL (ref 0.0–0.7)
Eosinophils Relative: 0 % (ref 0–5)
HCT: 16.5 % — ABNORMAL LOW (ref 36.0–46.0)
Hemoglobin: 5.6 g/dL — CL (ref 12.0–15.0)
Lymphocytes Relative: 3 % — ABNORMAL LOW (ref 12–46)
MCHC: 33.9 g/dL (ref 30.0–36.0)
Monocytes Relative: 4 % (ref 3–12)
Neutro Abs: 44 10*3/uL — ABNORMAL HIGH (ref 1.7–7.7)
Neutrophils Relative %: 92 % — ABNORMAL HIGH (ref 43–77)
RBC: 1.7 MIL/uL — ABNORMAL LOW (ref 3.87–5.11)
WBC: 47.8 10*3/uL — ABNORMAL HIGH (ref 4.0–10.5)

## 2013-04-27 LAB — POCT I-STAT, CHEM 8
Chloride: 103 mEq/L (ref 96–112)
Creatinine, Ser: 0.7 mg/dL (ref 0.50–1.10)
Creatinine, Ser: 0.8 mg/dL (ref 0.50–1.10)
Glucose, Bld: 130 mg/dL — ABNORMAL HIGH (ref 70–99)
Glucose, Bld: 108 mg/dL — ABNORMAL HIGH (ref 70–99)
HCT: 19 % — ABNORMAL LOW (ref 36.0–46.0)
Hemoglobin: 6.5 g/dL — CL (ref 12.0–15.0)
Hemoglobin: 6.5 g/dL — CL (ref 12.0–15.0)
Potassium: 3.7 mEq/L (ref 3.5–5.1)
Potassium: 3.9 mEq/L (ref 3.5–5.1)
Sodium: 139 mEq/L (ref 135–145)

## 2013-04-27 LAB — PROTIME-INR
INR: 1.12 (ref 0.00–1.49)
Prothrombin Time: 14.2 seconds (ref 11.6–15.2)

## 2013-04-27 LAB — TSH: TSH: 2.422 u[IU]/mL (ref 0.350–4.500)

## 2013-04-27 LAB — PREPARE RBC (CROSSMATCH)

## 2013-04-27 LAB — HEMOGLOBIN A1C: Hgb A1c MFr Bld: 7.1 % — ABNORMAL HIGH (ref <5.7)

## 2013-04-27 MED ORDER — ONDANSETRON HCL 4 MG PO TABS: 4.0000 mg | ORAL_TABLET | Freq: Four times a day (QID) | ORAL | Status: DC | PRN

## 2013-04-27 MED ORDER — SODIUM CHLORIDE 0.9 % IV SOLN
INTRAVENOUS | Status: DC
  Administered 2013-04-29 – 2013-04-30 (×3): via INTRAVENOUS

## 2013-04-27 MED ORDER — ACETAMINOPHEN 650 MG RE SUPP: 650.0000 mg | Freq: Four times a day (QID) | RECTAL | Status: DC | PRN

## 2013-04-27 MED ORDER — SENNOSIDES-DOCUSATE SODIUM 8.6-50 MG PO TABS
1.0000 | ORAL_TABLET | Freq: Every evening | ORAL | Status: DC | PRN
  Filled 2013-04-27 (×2): qty 1

## 2013-04-27 MED ORDER — SODIUM CHLORIDE 0.9 % IV BOLUS (SEPSIS)
1000.0000 mL | Freq: Once | INTRAVENOUS | Status: AC
  Administered 2013-04-27: 1000 mL via INTRAVENOUS

## 2013-04-27 MED ORDER — ACETAMINOPHEN 325 MG PO TABS: 650.0000 mg | ORAL_TABLET | Freq: Four times a day (QID) | ORAL | Status: DC | PRN

## 2013-04-27 MED ORDER — SODIUM CHLORIDE 0.9 % IJ SOLN
3.0000 mL | Freq: Two times a day (BID) | INTRAMUSCULAR | Status: DC
  Administered 2013-04-28 – 2013-04-29 (×4): 3 mL via INTRAVENOUS

## 2013-04-27 MED ORDER — LORAZEPAM 0.5 MG PO TABS: 0.5000 mg | ORAL_TABLET | Freq: Every evening | ORAL | Status: DC | PRN

## 2013-04-27 MED ORDER — ONDANSETRON HCL 4 MG/2ML IJ SOLN: 4.0000 mg | Freq: Four times a day (QID) | INTRAMUSCULAR | Status: DC | PRN

## 2013-04-27 MED ORDER — PROCHLORPERAZINE MALEATE 10 MG PO TABS
10.0000 mg | ORAL_TABLET | Freq: Four times a day (QID) | ORAL | Status: DC | PRN
  Filled 2013-04-27: qty 1

## 2013-04-27 MED ORDER — IOHEXOL 350 MG/ML SOLN
100.0000 mL | Freq: Once | INTRAVENOUS | Status: AC | PRN
  Administered 2013-04-27: 100 mL via INTRAVENOUS

## 2013-04-27 MED ORDER — ADULT MULTIVITAMIN W/MINERALS CH
1.0000 | ORAL_TABLET | Freq: Every day | ORAL | Status: DC
  Administered 2013-04-27 – 2013-04-30 (×4): 1 via ORAL
  Filled 2013-04-27 (×6): qty 1

## 2013-04-27 NOTE — ED Provider Notes (Signed)
Physical Exam  BP 96/48  Pulse 91  Temp(Src) 99 F (37.2 C) (Oral)  Resp 18  SpO2 100%  Physical Exam  Nursing note and vitals reviewed. Constitutional: She is oriented to person, place, and time. She appears well-developed and well-nourished. No distress.  HENT:  Head: Normocephalic and atraumatic.  Right Ear: External ear normal.  Left Ear: External ear normal.  Nose: Nose normal.  Mouth/Throat: Oropharynx is clear and moist.  Eyes: Conjunctivae are normal.  Neck: Normal range of motion.  Cardiovascular: Normal rate, regular rhythm and normal heart sounds.   Pulmonary/Chest: Effort normal and breath sounds normal. No stridor. No respiratory distress. She has no wheezes. She has no rales.  Abdominal: Soft. She exhibits no distension.  Musculoskeletal: Normal range of motion.  Neurological: She is alert and oriented to person, place, and time. She has normal strength.  Skin: Skin is warm and dry. She is not diaphoretic. No erythema.  Psychiatric: She has a normal mood and affect. Her behavior is normal.    ED Course  Procedures  Ct Angio Chest W/cm &/or Wo Cm  04/27/2013   *RADIOLOGY REPORT*  Clinical Data: Syncopal episode approximately 3 o'clock this morning the patient arose from bed.  Prior history of breast cancer.  Leukocytosis with white blood count of 47,800.  CT ANGIOGRAPHY CHEST  Technique:  Multidetector CT imaging of the chest using the standard protocol during bolus administration of intravenous contrast. Multiplanar reconstructed images including MIPs were obtained and reviewed to evaluate the vascular anatomy.  Contrast: OMNIPAQUE IOHEXOL 350 MG/ML SOLNV.  Comparison: None.  Findings: Contrast opacification of the pulmonary arteries is very good.  No filling defects within either main pulmonary artery or their branches in either lung to suggest pulmonary embolism.  Heart mildly enlarged with left ventricular predominance and mild left ventricular hypertrophy.   Moderate sized pericardial effusion. Moderate LAD coronary atherosclerosis.  Moderate atherosclerosis involving the thoracic aorta without aneurysm or dissection. Proximal great vessels widely patent.  Left subclavian Port-A-Cath tip in the upper right atrium.  No significant mediastinal, hilar, or axillary lymphadenopathy. Approximate 2.5 x 2.3 cm fluid collection in the right axilla. Visualized thyroid gland unremarkable.  Mild emphysematous changes in both lungs.  No confluent airspace consolidation.  No evidence of interstitial lung disease.  No pulmonary parenchymal nodules or masses.  Mild scarring in the lower lobes.  Central airways patent without significant bronchial wall thickening.  No pleural effusions.  Visualized upper abdomen unremarkable.  Anatomic variant in that the left lobe of the liver extends well across the midline into the left upper quadrant.  Bone window images demonstrate thoracic spondylosis and upper thoracic scoliosis convex left with compensatory lower thoracic scoliosis convex right.  IMPRESSION:  1.  No evidence of pulmonary embolism. 2.  Moderate-sized pericardial effusion. 3.  Mild cardiomegaly with left ventricular enlargement and left ventricular hypertrophy.  Moderate LAD coronary atherosclerosis. 4.  Mild COPD/emphysema.  No acute pulmonary disease. 5.  Approximate 2.5 cm fluid collection in the right axilla consistent with a post-operative seroma or resolving hematoma.   Original Report Authenticated By: Hulan Saas, M.D.    Labs Reviewed  CBC WITH DIFFERENTIAL - Abnormal; Notable for the following:    WBC 47.8 (*)    RBC 1.70 (*)    Hemoglobin 5.6 (*)    HCT 16.5 (*)    RDW 15.9 (*)    Neutrophils Relative % 92 (*)    Lymphocytes Relative 3 (*)    Neutro Abs  44.0 (*)    Monocytes Absolute 1.9 (*)    Basophils Absolute 0.5 (*)    All other components within normal limits  OCCULT BLOOD, POC DEVICE - Abnormal; Notable for the following:    Fecal Occult Bld  POSITIVE (*)    All other components within normal limits  POCT I-STAT, CHEM 8 - Abnormal; Notable for the following:    BUN 26 (*)    Glucose, Bld 108 (*)    Hemoglobin 6.5 (*)    HCT 19.0 (*)    All other components within normal limits  PROTIME-INR  APTT  TYPE AND SCREEN  PREPARE RBC (CROSSMATCH)  ABO/RH    MDM Patient states she is currently feeling well. Discussed her lab results and plan for admission. Discussed that she had blood in her stool. Remote history of this in the past. Never seen a GI doctor. Continues to deny shortness of breath or chest pain. Discussed case with hospitalist who agrees to admit to telemetry. Admission appreciated. Vital signs stable.       Mora Bellman, PA-C 04/27/13 0825  Mora Bellman, PA-C 04/27/13 [phone]

## 2013-04-27 NOTE — H&P (Signed)
[facility]          History and Physical    PCP:   Vivi Barrack, MD   Chief Complaint:  Passed out  HPI: Pleasant 67 y/o AAW with PMH significant for breast cancer currently undergoing chemotherapy and HTN. She went to the bathroom this am and after getting up from the toilet suddenly felt ferry dizzy and passed out. Her daughter states she heard her fall and found her on the floor in the bathroom and had to carry her to bed. She did not hurt herself during the fall. She woke up after about 1 min. No bowel/bladder incontinence, no slurred speech. She was brought to the ED where her Hb was 5.6, she was FOBT +, and a CT scan (done to r/o PE shows a moderate-sized pericardial effusion. On further questioning she admits to being fatigued over the past 2 days, but denies CP/SOB. We have been asked to admit her for further evaluation and management.  Allergies:   Allergies  Allergen Reactions  . Penicillins Rash      Past Medical History  Diagnosis Date  . Heart murmur   . Hypertension   . Anemia   . Allergy   . Arthritis   . Breast cancer   . Migraines     Past Surgical History  Procedure Laterality Date  . Abdominal hysterectomy      With bilateral salpingo-oophorectomy  . Portacath placement N/A 03/10/2013    Procedure: INSERTION PORT-A-CATH WITH FLUOROSCOPY AND ULTRASOUND;  Surgeon: Ernestene Mention, MD;  Location[facility];  Service: General;  Laterality: N/A;  . Axillary lymph node biopsy Right 03/10/2013    Procedure: AXILLARY LYMPH NODE BIOPSY;  Surgeon: Ernestene Mention, MD;  Location: [facility] OR;  Service: General;  Laterality: Right;  sentinel node with blue dye    Prior to Admission medications   Medication Sig Start Date End Date Taking? Authorizing Provider  dexamethasone (DECADRON) 4 MG tablet Take 2 tablets (8 mg total) by mouth 2 (two) times daily with a meal. 03/13/13  Yes Lowella Dell, MD  lidocaine-prilocaine (EMLA) cream Apply 1 application  topically as needed (port access). Apply to port area 1-2 hours before chemotherapy; cover with plastioc wrap 03/13/13  Yes Lowella Dell, MD  LORazepam (ATIVAN) 0.5 MG tablet Take 1 tablet (0.5 mg total) by mouth at bedtime as needed for anxiety. 03/13/13  Yes Lowella Dell, MD  Multiple Vitamin (MULTIVITAMIN WITH MINERALS) TABS Take 1 tablet by mouth daily.   Yes Historical Provider, MD  naproxen sodium (ANAPROX) 220 MG tablet Take 220 mg by mouth 2 (two) times daily as needed (pain).   Yes Historical Provider, MD  prochlorperazine (COMPAZINE) 10 MG tablet Take 10 mg by mouth every 6 (six) hours as needed (nausea/anxiety). 03/13/13  Yes Lowella Dell, MD  triamterene-hydrochlorothiazide (MAXZIDE-25) 37.5-25 MG per tablet Take 1 tablet by mouth daily. 03/05/13  Yes Mathis Dad, MD  fluconazole (DIFLUCAN) 100 MG tablet Take 200 mg by mouth See admin instructions. 2 tabs by mouth x 1 day, then 1 tab by mouth daily 04/07/13   Catalina Gravel, PA-C    Social History:  reports that she has quit smoking. Her smoking use included Cigarettes. She smoked 0.00 packs per day. She has never used smokeless tobacco. She reports that she does not drink alcohol or use illicit drugs.  Family History  Problem Relation Age of Onset  . Cancer Mother   . Congestive Heart  Failure Mother   . Cancer Brother   . Diabetes Brother   . Heart disease Brother     AMI 05/22/2012  . Congestive Heart Failure Brother   . Cancer Maternal Grandmother     Review of Systems:  Constitutional: Denies fever, chills, diaphoresis, appetite change. HEENT: Denies photophobia, eye pain, redness, hearing loss, ear pain, congestion, sore throat, rhinorrhea, sneezing, mouth sores, trouble swallowing, neck pain, neck stiffness and tinnitus.   Respiratory: Denies SOB, DOE, cough, chest tightness,  and wheezing.   Cardiovascular: Denies chest pain, palpitations and leg swelling.  Gastrointestinal: Denies nausea, vomiting, abdominal  pain, diarrhea, constipation, blood in stool and abdominal distention.  Genitourinary: Denies dysuria, urgency, frequency, hematuria, flank pain and difficulty urinating.  Endocrine: Denies: hot or cold intolerance, sweats, changes in hair or nails, polyuria, polydipsia. Musculoskeletal: Denies myalgias, back pain, joint swelling, arthralgias and gait problem.  Skin: Denies pallor, rash and wound.  Neurological: Denies dizziness, seizures, syncope, weakness, light-headedness, numbness and headaches.  Hematological: Denies adenopathy. Easy bruising, personal or family bleeding history  Psychiatric/Behavioral: Denies suicidal ideation, mood changes, confusion, nervousness, sleep disturbance and agitation   Physical Exam: Blood pressure 123/61, pulse 83, temperature 98.3 F (36.8 C), temperature source Oral, resp. rate 23, SpO2 100.00%. Gen: AA Ox3, NAD, very pleasant and cooperative with exam. HEENT: [place]/AT/PERRL/EOMI/dry mucous membranes. Neck: supple, no JVD, no LAD, no bruits, no goiter. CV: RRR, +systolic murmur, no rubs or gallops Lungs: CTA B ABd: S/NT/ND/+BS/no masses or organomegaly noted Ext: no C/C/E/+pedal pulses Neuro: grossly intact and non-focal  Labs on Admission:  Results for orders placed during the hospital encounter of 04/27/13 (from the past 48 hour(s))  CBC WITH DIFFERENTIAL     Status: Abnormal   Collection Time    04/27/13  5:07 AM      Result Value Range   WBC 47.8 (*) 4.0 - 10.5 K/uL   RBC 1.70 (*) 3.87 - 5.11 MIL/uL   Hemoglobin 5.6 (*) 12.0 - 15.0 g/dL   Comment: REPEATED TO VERIFY     CRITICAL RESULT CALLED TO, READ BACK BY AND VERIFIED WITH:     NEILSON,T RN @ [id number] ON 08.03.2014 BY MCREYNOLDS,B   HCT 16.5 (*) 36.0 - 46.0 %   MCV 97.1  78.0 - 100.0 fL   MCH 32.9  26.0 - 34.0 pg   MCHC 33.9  30.0 - 36.0 g/dL   RDW 84.6 (*) 96.2 - 95.2 %   Platelets 199  150 - 400 K/uL   Neutrophils Relative % 92 (*) 43 - 77 %   Lymphocytes Relative 3 (*) 12 - 46 %    Monocytes Relative 4  3 - 12 %   Eosinophils Relative 0  0 - 5 %   Basophils Relative 1  0 - 1 %   Neutro Abs 44.0 (*) 1.7 - 7.7 K/uL   Lymphs Abs 1.4  0.7 - 4.0 K/uL   Monocytes Absolute 1.9 (*) 0.1 - 1.0 K/uL   Eosinophils Absolute 0.0  0.0 - 0.7 K/uL   Basophils Absolute 0.5 (*) 0.0 - 0.1 K/uL   RBC Morphology POLYCHROMASIA PRESENT     Comment: RARE NRBCs   WBC Morphology       Value: MODERATE LEFT SHIFT (>5% METAS AND MYELOS,OCC PRO NOTED)   Comment: ATYPICAL MONONUCLEAR CELLS  TYPE AND SCREEN     Status: None   Collection Time    04/27/13  6:20 AM      Result Value Range  ABO/RH(D) O POS     Antibody Screen NEG     Sample Expiration 04/30/2013     Unit Number [id number]     Blood Component Type RBC LR PHER1     Unit division 00     Status of Unit ALLOCATED     Transfusion Status OK TO TRANSFUSE     Crossmatch Result Compatible     Unit Number [id number]     Blood Component Type RBC LR PHER1     Unit division 00     Status of Unit ISSUED     Transfusion Status OK TO TRANSFUSE     Crossmatch Result Compatible    ABO/RH     Status: None   Collection Time    04/27/13  6:20 AM      Result Value Range   ABO/RH(D) O POS    OCCULT BLOOD, POC DEVICE     Status: Abnormal   Collection Time    04/27/13  6:33 AM      Result Value Range   Fecal Occult Bld POSITIVE (*) NEGATIVE  PREPARE RBC (CROSSMATCH)     Status: None   Collection Time    04/27/13  7:00 AM      Result Value Range   Order Confirmation ORDER PROCESSED BY BLOOD BANK    PROTIME-INR     Status: None   Collection Time    04/27/13  7:45 AM      Result Value Range   Prothrombin Time 14.2  11.6 - 15.2 seconds   INR 1.12  0.00 - 1.49  APTT     Status: None   Collection Time    04/27/13  7:45 AM      Result Value Range   aPTT 26  24 - 37 seconds  POCT I-STAT, CHEM 8     Status: Abnormal   Collection Time    04/27/13  7:53 AM      Result Value Range   Sodium 139  135 - 145 mEq/L   Potassium 3.9  3.5  - 5.1 mEq/L   Chloride 103  96 - 112 mEq/L   BUN 26 (*) 6 - 23 mg/dL   Creatinine, Ser 3.08  0.50 - 1.10 mg/dL   Glucose, Bld 657 (*) 70 - 99 mg/dL   Calcium, Ion 8.46  9.62 - 1.30 mmol/L   TCO2 25  0 - 100 mmol/L   Hemoglobin 6.5 (*) 12.0 - 15.0 g/dL   HCT 95.2 (*) 84.1 - 32.4 %   Comment NOTIFIED PHYSICIAN      Radiological Exams on Admission: Ct Angio Chest W/cm &/or Wo Cm  04/27/2013   *RADIOLOGY REPORT*  Clinical Data: Syncopal episode approximately 3 o'clock this morning the patient arose from bed.  Prior history of breast cancer.  Leukocytosis with white blood count of 47,800.  CT ANGIOGRAPHY CHEST  Technique:  Multidetector CT imaging of the chest using the standard protocol during bolus administration of intravenous contrast. Multiplanar reconstructed images including MIPs were obtained and reviewed to evaluate the vascular anatomy.  Contrast: OMNIPAQUE IOHEXOL 350 MG/ML SOLNV.  Comparison: None.  Findings: Contrast opacification of the pulmonary arteries is very good.  No filling defects within either main pulmonary artery or their branches in either lung to suggest pulmonary embolism.  Heart mildly enlarged with left ventricular predominance and mild left ventricular hypertrophy.  Moderate sized pericardial effusion. Moderate LAD coronary atherosclerosis.  Moderate atherosclerosis involving the thoracic aorta without aneurysm or dissection. Proximal  great vessels widely patent.  Left subclavian Port-A-Cath tip in the upper right atrium.  No significant mediastinal, hilar, or axillary lymphadenopathy. Approximate 2.5 x 2.3 cm fluid collection in the right axilla. Visualized thyroid gland unremarkable.  Mild emphysematous changes in both lungs.  No confluent airspace consolidation.  No evidence of interstitial lung disease.  No pulmonary parenchymal nodules or masses.  Mild scarring in the lower lobes.  Central airways patent without significant bronchial wall thickening.  No pleural  effusions.  Visualized upper abdomen unremarkable.  Anatomic variant in that the left lobe of the liver extends well across the midline into the left upper quadrant.  Bone window images demonstrate thoracic spondylosis and upper thoracic scoliosis convex left with compensatory lower thoracic scoliosis convex right.  IMPRESSION:  1.  No evidence of pulmonary embolism. 2.  Moderate-sized pericardial effusion. 3.  Mild cardiomegaly with left ventricular enlargement and left ventricular hypertrophy.  Moderate LAD coronary atherosclerosis. 4.  Mild COPD/emphysema.  No acute pulmonary disease. 5.  Approximate 2.5 cm fluid collection in the right axilla consistent with a post-operative seroma or resolving hematoma.   Original Report Authenticated By: Hulan Saas, M.D.    Assessment/Plan Principal Problem:   Syncope and collapse Active Problems:   Essential hypertension, benign   Anemia   Cancer of central portion of female breast   Leukocytosis   Pericardial effusion   Syncope -Suspect related to her anemia (see below for details). -Will admit to tele, cycle troponins.  Anemia -Suspect acute blood loss on top of chronic disease related to breast cancer and ongoing chemotherapy. -Hb was 8.7 3 days prior. -FOBT was positive. -Unfortunately ED has already started transfusion without drawing an anemia panel first. -Plan to transfuse 2 units of PRBC. -[facility], Dr. Randa Evens, has been consulted for possible endoscopic evaluations. Patient will be placed on clear liquids and NPO after midnight.  Pericardial Effusion -Noted incidentally on CT scan to r/o PE. -Moderate sized. -Will order 2 D ECHO to further evaluate as well as TSH to make hypothyroidism is not contributing to the effusion. -Unlikely that syncope is related to this as she has no clinical signs of tamponade.  Leukocytosis -Was 2.3 3 days ago. -Likely 2/2 neulasta received after last week.  Breast Cancer -Is scheduled for chemo  in am. -Have added Dr. Darnelle Catalan as a consult.  HTN -Hold BP meds for now.  DVT Prophylaxis -SCDs.  Code Status -Full code as discussed with patient and her daughter.  Time Spent on Admission: 80 minutes  HERNANDEZ ACOSTA,ESTELA [facility] Pager: [phone] 04/27/2013, 9:24 AM

## 2013-04-27 NOTE — ED Notes (Signed)
Report given to [name], Charity fundraiser. Pt transported to floor with continuous cardiac monitoring.

## 2013-04-27 NOTE — ED Notes (Signed)
Patient is alert and oriented x3.  She states that she had a syncopal episode at 3am when she  Got up to go to the bathroom.  She denies any past history of this issue.  She denies any pain or  Hitting her head when she fell.

## 2013-04-27 NOTE — ED Provider Notes (Signed)
CSN: [id number]     Arrival date & time 04/27/13  4098 History     First MD Initiated Contact with Patient 04/27/13 [phone]     Chief Complaint  Patient presents with  . Loss of Consciousness   HPI  History provided by the patient and family. Patient is a 67 year old female with history of hypertension, anemia and active breast cancer undergoing chemotherapy who presents after a syncopal episode. Patient awoke around 3 AM to use the restroom. She had a bowel movement and shortly after stood up and began to feel lightheaded followed by a brief syncopal episode lasting a few seconds. Patient denies any injury from her fall. She did not have any associated chest pains or shortness of breath. Denies having similar symptoms previously. She has had a slightly decreased appetite but has been kneading regularly with normal by mouth fluids. She has not had any recent fever, chills or sweats. No other aggravating or alleviating factors. No other associated symptoms.     Past Medical History  Diagnosis Date  . Heart murmur   . Hypertension   . Anemia   . Allergy   . Arthritis   . Breast cancer   . Migraines    Past Surgical History  Procedure Laterality Date  . Abdominal hysterectomy      With bilateral salpingo-oophorectomy  . Portacath placement N/A 03/10/2013    Procedure: INSERTION PORT-A-CATH WITH FLUOROSCOPY AND ULTRASOUND;  Surgeon: Ernestene Mention, MD;  Location: [facility] OR;  Service: General;  Laterality: N/A;  . Axillary lymph node biopsy Right 03/10/2013    Procedure: AXILLARY LYMPH NODE BIOPSY;  Surgeon: Ernestene Mention, MD;  Location: [facility] OR;  Service: General;  Laterality: Right;  sentinel node with blue dye   Family History  Problem Relation Age of Onset  . Cancer Mother   . Congestive Heart Failure Mother   . Cancer Brother   . Diabetes Brother   . Heart disease Brother     AMI 05/22/2012  . Congestive Heart Failure Brother   . Cancer Maternal Grandmother    History   Substance Use Topics  . Smoking status: Former Smoker    Types: Cigarettes  . Smokeless tobacco: Never Used     Comment: smoked in college 1 yr /occ wine  . Alcohol Use: No   OB History   Grav Para Term Preterm Abortions TAB SAB Ect Mult Living   1 1 1  0 0 0 0 0 0 1     Review of Systems  Constitutional: Negative for fever and chills.  Gastrointestinal: Negative for nausea, vomiting, abdominal pain, diarrhea, constipation and blood in stool.  Neurological: Positive for syncope and light-headedness. Negative for weakness and headaches.  All other systems reviewed and are negative.    Allergies  Penicillins  Home Medications   Current Outpatient Rx  Name  Route  Sig  Dispense  Refill  . dexamethasone (DECADRON) 4 MG tablet   Oral   Take 2 tablets (8 mg total) by mouth 2 (two) times daily with a meal.   24 tablet   3   . lidocaine-prilocaine (EMLA) cream   Topical   Apply 1 application topically as needed (port access). Apply to port area 1-2 hours before chemotherapy; cover with plastioc wrap         . LORazepam (ATIVAN) 0.5 MG tablet   Oral   Take 1 tablet (0.5 mg total) by mouth at bedtime as needed for anxiety.   20 tablet  0   . Multiple Vitamin (MULTIVITAMIN WITH MINERALS) TABS   Oral   Take 1 tablet by mouth daily.         . naproxen sodium (ANAPROX) 220 MG tablet   Oral   Take 220 mg by mouth 2 (two) times daily as needed (pain).         . prochlorperazine (COMPAZINE) 10 MG tablet   Oral   Take 10 mg by mouth every 6 (six) hours as needed (nausea/anxiety).         . triamterene-hydrochlorothiazide (MAXZIDE-25) 37.5-25 MG per tablet   Oral   Take 1 tablet by mouth daily.   30 tablet   3   . fluconazole (DIFLUCAN) 100 MG tablet   Oral   Take 200 mg by mouth See admin instructions. 2 tabs by mouth x 1 day, then 1 tab by mouth daily          BP 96/48  Pulse 91  Temp(Src) 99 F (37.2 C) (Oral)  Resp 18  SpO2 100% Physical Exam   Nursing note and vitals reviewed. Constitutional: She is oriented to person, place, and time. She appears well-developed and well-nourished. No distress.  HENT:  Head: Normocephalic and atraumatic.  Mouth/Throat: Oropharynx is clear and moist.  Eyes: Conjunctivae and EOM are normal. Pupils are equal, round, and reactive to light.  Neck: Normal range of motion. Neck supple.  No cervical midline tenderness  Cardiovascular: Normal rate and regular rhythm.   Pulmonary/Chest: Effort normal and breath sounds normal. No respiratory distress. She has no wheezes. She has no rales.  Abdominal: Soft. There is no tenderness. There is no rebound and no guarding.  Musculoskeletal: Normal range of motion. She exhibits no edema and no tenderness.  No pain or signs of injuries to extremities.  Neurological: She is alert and oriented to person, place, and time.  Skin: Skin is warm and dry. No rash noted. There is pallor.  Psychiatric: She has a normal mood and affect. Her behavior is normal.    ED Course   Procedures   Results for orders placed during the hospital encounter of 04/27/13  CBC WITH DIFFERENTIAL      Result Value Range   WBC 47.8 (*) 4.0 - 10.5 K/uL   RBC 1.70 (*) 3.87 - 5.11 MIL/uL   Hemoglobin 5.6 (*) 12.0 - 15.0 g/dL   HCT 21.3 (*) 08.6 - 57.8 %   MCV 97.1  78.0 - 100.0 fL   MCH 32.9  26.0 - 34.0 pg   MCHC 33.9  30.0 - 36.0 g/dL   RDW 46.9 (*) 62.9 - 52.8 %   Platelets 199  150 - 400 K/uL   Neutrophils Relative % PENDING  43 - 77 %   Neutro Abs PENDING  1.7 - 7.7 K/uL   Band Neutrophils PENDING  0 - 10 %   Lymphocytes Relative PENDING  12 - 46 %   Lymphs Abs PENDING  0.7 - 4.0 K/uL   Monocytes Relative PENDING  3 - 12 %   Monocytes Absolute PENDING  0.1 - 1.0 K/uL   Eosinophils Relative PENDING  0 - 5 %   Eosinophils Absolute PENDING  0.0 - 0.7 K/uL   Basophils Relative PENDING  0 - 1 %   Basophils Absolute PENDING  0.0 - 0.1 K/uL   WBC Morphology PENDING     RBC  Morphology PENDING     Smear Review PENDING     nRBC PENDING  0 /100 WBC  Metamyelocytes Relative PENDING     Myelocytes PENDING     Promyelocytes Absolute PENDING     Blasts PENDING         No results found.    No diagnosis found.  MDM  Patient seen and evaluated. Patient resting comfortably appears well this time without any complaints. Denies chest pain shortness of breath. No pleuritic pains. I discussed with patient and family are very mild risk for a PE. Patient does not have any other significant symptoms aside from her brisk of cancer and syncope however family and patient agreed to proceed with a CT scan to rule out this possibility. We'll also obtain basic labs and orthostatics.  Patient found to be anemic. Type and cross with 2 units ordered.  Patient discussed in sign out with Birdie Hopes PA-C.  SHe will continue to follow test results and plan for admission given anemia.      Date: 04/27/2013  Rate: 82  Rhythm: normal sinus rhythm  QRS Axis: normal  Intervals: normal  ST/T Wave abnormalities: nonspecific ST/T changes  Conduction Disutrbances:right bundle branch block and left anterior fascicular block  Narrative Interpretation:   Old EKG Reviewed: unchanged    Angus Seller, PA-C 04/27/13 [phone]

## 2013-04-27 NOTE — ED Provider Notes (Signed)
Medical screening examination/treatment/procedure(s) were performed by non-physician practitioner and as supervising physician I was immediately available for consultation/collaboration.  John-Adam Jadah Bobak, M.D.   John-Adam Breelyn Icard, MD 04/27/13 0716 

## 2013-04-27 NOTE — Consult Note (Signed)
[facility] CONSULT Reason for consult: subacute G.I. bleeding Referring Physician: [facility]. PCP: Eileen Stanford M.D. Oncologist: Dr. Arlice Colt. Primary G.I.: Dr. Dorena Cookey  Tabitha Ramirez is an 67 y.o. female.  HPI: she has breast cancer and is currently undergoing chemotherapy with plans to complete the chemotherapy and proceed with radiation therapy. She had a colonoscopy by Dr. Madilyn Fireman about 2 years ago that she reports was normal. Do not have the results immediately available that she was not told that she needed another colonoscopy in the future. The patient was admitted after going to the bathroom feeling weak and dizzy and passing out. She presented to the ER with symptoms and was found to have a hemoglobin of 5.6 stools positive for occult blood. She is not had any melena, hematochezia, abdominal pain, heartburn or indigestion. She denies any use of NSAIDs. White blood count was elevated platelet count was in normal range. It was felt this was all a result of her chemotherapy. She has received one unit of red cells. Her only other medical problems are hypertension and a previous history of anemia. She has had previous hysterectomy.  Past Medical History  Diagnosis Date  . Heart murmur   . Hypertension   . Anemia   . Allergy   . Arthritis   . Breast cancer   . Migraines     Past Surgical History  Procedure Laterality Date  . Abdominal hysterectomy      With bilateral salpingo-oophorectomy  . Portacath placement N/A 03/10/2013    Procedure: INSERTION PORT-A-CATH WITH FLUOROSCOPY AND ULTRASOUND;  Surgeon: Ernestene Mention, MD;  Location: [facility] OR;  Service: General;  Laterality: N/A;  . Axillary lymph node biopsy Right 03/10/2013    Procedure: AXILLARY LYMPH NODE BIOPSY;  Surgeon: Ernestene Mention, MD;  Location: [facility] OR;  Service: General;  Laterality: Right;  sentinel node with blue dye    Family History  Problem Relation Age of Onset  . Cancer Mother   . Congestive  Heart Failure Mother   . Cancer Brother   . Diabetes Brother   . Heart disease Brother     AMI 05/22/2012  . Congestive Heart Failure Brother   . Cancer Maternal Grandmother     Social History:  reports that she has quit smoking. Her smoking use included Cigarettes. She smoked 0.00 packs per day. She has never used smokeless tobacco. She reports that she does not drink alcohol or use illicit drugs.  Allergies:  Allergies  Allergen Reactions  . Penicillins Rash    Medications; . multivitamin with minerals  1 tablet Oral Daily  . sodium chloride  3 mL Intravenous Q12H   PRN Meds acetaminophen, acetaminophen, LORazepam, ondansetron (ZOFRAN) IV, ondansetron, prochlorperazine, senna-docusate Results for orders placed during the hospital encounter of 04/27/13 (from the past 48 hour(s))  CBC WITH DIFFERENTIAL     Status: Abnormal   Collection Time    04/27/13  5:07 AM      Result Value Range   WBC 47.8 (*) 4.0 - 10.5 K/uL   RBC 1.70 (*) 3.87 - 5.11 MIL/uL   Hemoglobin 5.6 (*) 12.0 - 15.0 g/dL   Comment: REPEATED TO VERIFY     CRITICAL RESULT CALLED TO, READ BACK BY AND VERIFIED WITH:     NEILSON,T RN @ [phone] ON 08.03.2014 BY MCREYNOLDS,B   HCT 16.5 (*) 36.0 - 46.0 %   MCV 97.1  78.0 - 100.0 fL   MCH 32.9  26.0 - 34.0 pg  MCHC 33.9  30.0 - 36.0 g/dL   RDW 47.8 (*) 29.5 - 62.1 %   Platelets 199  150 - 400 K/uL   Neutrophils Relative % 92 (*) 43 - 77 %   Lymphocytes Relative 3 (*) 12 - 46 %   Monocytes Relative 4  3 - 12 %   Eosinophils Relative 0  0 - 5 %   Basophils Relative 1  0 - 1 %   Neutro Abs 44.0 (*) 1.7 - 7.7 K/uL   Lymphs Abs 1.4  0.7 - 4.0 K/uL   Monocytes Absolute 1.9 (*) 0.1 - 1.0 K/uL   Eosinophils Absolute 0.0  0.0 - 0.7 K/uL   Basophils Absolute 0.5 (*) 0.0 - 0.1 K/uL   RBC Morphology POLYCHROMASIA PRESENT     Comment: RARE NRBCs   WBC Morphology       Value: MODERATE LEFT SHIFT (>5% METAS AND MYELOS,OCC PRO NOTED)   Comment: ATYPICAL MONONUCLEAR CELLS   TYPE AND SCREEN     Status: None   Collection Time    04/27/13  6:20 AM      Result Value Range   ABO/RH(D) O POS     Antibody Screen NEG     Sample Expiration 04/30/2013     Unit Number [id number]     Blood Component Type RBC LR PHER1     Unit division 00     Status of Unit ISSUED     Transfusion Status OK TO TRANSFUSE     Crossmatch Result Compatible     Unit Number [id number]     Blood Component Type RBC LR PHER1     Unit division 00     Status of Unit ISSUED     Transfusion Status OK TO TRANSFUSE     Crossmatch Result Compatible    ABO/RH     Status: None   Collection Time    04/27/13  6:20 AM      Result Value Range   ABO/RH(D) O POS    OCCULT BLOOD, POC DEVICE     Status: Abnormal   Collection Time    04/27/13  6:33 AM      Result Value Range   Fecal Occult Bld POSITIVE (*) NEGATIVE  PREPARE RBC (CROSSMATCH)     Status: None   Collection Time    04/27/13  7:00 AM      Result Value Range   Order Confirmation ORDER PROCESSED BY BLOOD BANK    PROTIME-INR     Status: None   Collection Time    04/27/13  7:45 AM      Result Value Range   Prothrombin Time 14.2  11.6 - 15.2 seconds   INR 1.12  0.00 - 1.49  APTT     Status: None   Collection Time    04/27/13  7:45 AM      Result Value Range   aPTT 26  24 - 37 seconds  POCT I-STAT, CHEM 8     Status: Abnormal   Collection Time    04/27/13  7:53 AM      Result Value Range   Sodium 139  135 - 145 mEq/L   Potassium 3.9  3.5 - 5.1 mEq/L   Chloride 103  96 - 112 mEq/L   BUN 26 (*) 6 - 23 mg/dL   Creatinine, Ser 7.25  0.50 - 1.10 mg/dL   Glucose, Bld 366 (*) 70 - 99 mg/dL   Calcium, Ion 4.40  3.47 - 1.30  mmol/L   TCO2 25  0 - 100 mmol/L   Hemoglobin 6.5 (*) 12.0 - 15.0 g/dL   HCT 16.1 (*) 09.6 - 04.5 %   Comment NOTIFIED PHYSICIAN      Ct Angio Chest W/cm &/or Wo Cm  04/27/2013   *RADIOLOGY REPORT*  Clinical Data: Syncopal episode approximately 3 o'clock this morning the patient arose from bed.  Prior  history of breast cancer.  Leukocytosis with white blood count of 47,800.  CT ANGIOGRAPHY CHEST  Technique:  Multidetector CT imaging of the chest using the standard protocol during bolus administration of intravenous contrast. Multiplanar reconstructed images including MIPs were obtained and reviewed to evaluate the vascular anatomy.  Contrast: OMNIPAQUE IOHEXOL 350 MG/ML SOLNV.  Comparison: None.  Findings: Contrast opacification of the pulmonary arteries is very good.  No filling defects within either main pulmonary artery or their branches in either lung to suggest pulmonary embolism.  Heart mildly enlarged with left ventricular predominance and mild left ventricular hypertrophy.  Moderate sized pericardial effusion. Moderate LAD coronary atherosclerosis.  Moderate atherosclerosis involving the thoracic aorta without aneurysm or dissection. Proximal great vessels widely patent.  Left subclavian Port-A-Cath tip in the upper right atrium.  No significant mediastinal, hilar, or axillary lymphadenopathy. Approximate 2.5 x 2.3 cm fluid collection in the right axilla. Visualized thyroid gland unremarkable.  Mild emphysematous changes in both lungs.  No confluent airspace consolidation.  No evidence of interstitial lung disease.  No pulmonary parenchymal nodules or masses.  Mild scarring in the lower lobes.  Central airways patent without significant bronchial wall thickening.  No pleural effusions.  Visualized upper abdomen unremarkable.  Anatomic variant in that the left lobe of the liver extends well across the midline into the left upper quadrant.  Bone window images demonstrate thoracic spondylosis and upper thoracic scoliosis convex left with compensatory lower thoracic scoliosis convex right.  IMPRESSION:  1.  No evidence of pulmonary embolism. 2.  Moderate-sized pericardial effusion. 3.  Mild cardiomegaly with left ventricular enlargement and left ventricular hypertrophy.  Moderate LAD coronary  atherosclerosis. 4.  Mild COPD/emphysema.  No acute pulmonary disease. 5.  Approximate 2.5 cm fluid collection in the right axilla consistent with a post-operative seroma or resolving hematoma.   Original Report Authenticated By: Hulan Saas, M.D.               Blood pressure 125/50, pulse 85, temperature 97.9 F (36.6 C), temperature source Oral, resp. rate 18, height 5\' 4"  (1.626 m), weight 68.9 kg (151 lb 14.4 oz), SpO2 100.00%.  Physical exam:   General-- pleasant African-American female in no acute distress Heart-- regular rate and rhythm without murmurs are gallops Lungs--clear Abdomen-- nondistended and soft and completely nontender   Assessment: 1. Syncope/anemia/Hemoccult positive stools. Patient has had colonoscopy 2 years ago that she reports was normal. She could have ulcer or other upper G.I. Source. Her platelets are normal so I doubt this is bone marrow suppression from her chemotherapy  Plan: agree with going ahead with transfusion. Empirically give PPI therapy. Will plan EGD in the morning. Time will be determined. I have discussed this with the patient and she is agreeable.   Kassem Kibbe JR,Madge Therrien L 04/27/2013, 2:31 PM

## 2013-04-28 ENCOUNTER — Ambulatory Visit: Payer: Medicare Other | Admitting: Physician Assistant

## 2013-04-28 ENCOUNTER — Encounter (HOSPITAL_COMMUNITY)
Admission: EM | Disposition: A | Discharge status: Home / Self Care | Payer: Self-pay | Source: Home / Self Care | Attending: Internal Medicine

## 2013-04-28 ENCOUNTER — Other Ambulatory Visit: Payer: Medicare Other | Admitting: Lab

## 2013-04-28 ENCOUNTER — Encounter (HOSPITAL_COMMUNITY): Payer: Self-pay

## 2013-04-28 ENCOUNTER — Ambulatory Visit: Payer: Medicare Other

## 2013-04-28 DIAGNOSIS — I319 Disease of pericardium, unspecified: Secondary | ICD-10-CM

## 2013-04-28 DIAGNOSIS — C50119 Malignant neoplasm of central portion of unspecified female breast: Secondary | ICD-10-CM

## 2013-04-28 HISTORY — PX: ESOPHAGOGASTRODUODENOSCOPY: SHX5428

## 2013-04-28 LAB — CBC
HCT: 24.2 % — ABNORMAL LOW (ref 36.0–46.0)
Hemoglobin: 8.5 g/dL — ABNORMAL LOW (ref 12.0–15.0)
MCH: 31.6 pg (ref 26.0–34.0)
MCHC: 35.1 g/dL (ref 30.0–36.0)
MCV: 90 fL (ref 78.0–100.0)

## 2013-04-28 LAB — TYPE AND SCREEN
ABO/RH(D): O POS
Antibody Screen: NEGATIVE
Unit division: 0

## 2013-04-28 LAB — BASIC METABOLIC PANEL
BUN: 16 mg/dL (ref 6–23)
Chloride: 107 mEq/L (ref 96–112)
GFR calc non Af Amer: 87 mL/min — ABNORMAL LOW (ref >90)
Glucose, Bld: 90 mg/dL (ref 70–99)
Potassium: 3.2 mEq/L — ABNORMAL LOW (ref 3.5–5.1)

## 2013-04-28 LAB — TROPONIN I: Troponin I: <0.3 ng/mL (ref <0.30)

## 2013-04-28 SURGERY — EGD (ESOPHAGOGASTRODUODENOSCOPY)
Anesthesia: Moderate Sedation

## 2013-04-28 MED ORDER — MIDAZOLAM HCL 10 MG/2ML IJ SOLN
INTRAMUSCULAR | Status: AC
  Filled 2013-04-28: qty 2

## 2013-04-28 MED ORDER — DIPHENHYDRAMINE HCL 50 MG/ML IJ SOLN
INTRAMUSCULAR | Status: AC
  Filled 2013-04-28: qty 1

## 2013-04-28 MED ORDER — BUTAMBEN-TETRACAINE-BENZOCAINE 2-2-14 % EX AERO
INHALATION_SPRAY | CUTANEOUS | Status: DC | PRN
  Administered 2013-04-28: 2 via TOPICAL

## 2013-04-28 MED ORDER — MIDAZOLAM HCL 10 MG/2ML IJ SOLN
INTRAMUSCULAR | Status: DC | PRN
  Administered 2013-04-28 (×2): 2 mg via INTRAVENOUS

## 2013-04-28 MED ORDER — FENTANYL CITRATE 0.05 MG/ML IJ SOLN
INTRAMUSCULAR | Status: DC | PRN
  Administered 2013-04-28 (×2): 25 ug via INTRAVENOUS

## 2013-04-28 MED ORDER — POTASSIUM CHLORIDE CRYS ER 20 MEQ PO TBCR
40.0000 meq | EXTENDED_RELEASE_TABLET | Freq: Once | ORAL | Status: AC
  Administered 2013-04-28: 40 meq via ORAL
  Filled 2013-04-28: qty 2

## 2013-04-28 MED ORDER — FENTANYL CITRATE 0.05 MG/ML IJ SOLN
INTRAMUSCULAR | Status: AC
  Filled 2013-04-28: qty 2

## 2013-04-28 NOTE — ED Provider Notes (Signed)
Medical screening examination/treatment/procedure(s) were performed by non-physician practitioner and as supervising physician I was immediately available for consultation/collaboration.  Geoffery Lyons, MD 04/28/13 [phone]

## 2013-04-28 NOTE — Progress Notes (Signed)
*  PRELIMINARY RESULTS* Echocardiogram 2D Echocardiogram has been performed.  Jeryl Columbia 04/28/2013, 11:31 AM

## 2013-04-28 NOTE — Op Note (Signed)
[facility] [address] [place], [address]   ENDOSCOPY PROCEDURE REPORT  PATIENT: Tabitha, Ramirez  MR#: [id number] BIRTHDATE: 17-Mar-1946 , 66  yrs. old GENDER: Female ENDOSCOPIST: Wandalee Ferdinand, MD REFERRED BY: PROCEDURE DATE:  04/28/2013 PROCEDURE:   EGD ASA CLASS: 3 INDICATIONS: anemia, heme positive stool MEDICATIONS: fentanyl 50 mcg IV, Versed 4 mg IV TOPICAL ANESTHETIC:Cetacaine spray  DESCRIPTION OF PROCEDURE:   After the risks benefits and alternatives of the procedure were thoroughly explained, informed consent was obtained.  The PENTAX GASTOROSCOPE [id number]  endoscope was introduced through the mouth and advanced to the second portion of the duodenum      , limited by Without limitations.   The instrument was slowly withdrawn as the mucosa was fully examined.      FINDINGS:  Esophagus: Normal  Stomach: Normal  Duodenum: Normal  There is nothing on this exam to explain anemia or heme positive stool  COMPLICATIONS:none  ENDOSCOPIC IMPRESSION:see above         _______________________________ Rosalie DoctorWandalee Ferdinand, MD 04/28/2013 1:06 PM

## 2013-04-28 NOTE — Progress Notes (Signed)
[facility] PROGRESS NOTE  Tabitha Ramirez WUJ:[id number] DOB: 1946/07/09 DOA: 04/27/2013 PCP: Vivi Barrack, MD  Assessment/Plan: Syncope  -Suspect related to her anemia  -no events on tele  Anemia  -Suspect acute blood loss on top of chronic disease related to breast cancer and ongoing chemotherapy.  -Hb was 8.7 3 days prior.  -FOBT was positive.  - transfused 2 units of PRBC 8/3.  -[facility] to see for EGD today   Pericardial Effusion  -Noted incidentally on CT scan to r/o PE.  -Fu 2 D ECHO, TSH.  -Unlikely that syncope is related to this as she has no clinical signs of tamponade, him seconadry to her CA  Leukocytosis  -Likely 2/2 neulasta received after last week.   Breast Cancer  -hold chemo acutely -Dr. Darnelle Catalan added as a consult.   HTN  -Hold BP meds for now.   DVT Prophylaxis  -SCDs.   Code Status  -Full code Family communication: none at bedside Disposition: home in 1-2days      Consultants:  [facility]  HPI/Subjective: Feels much better  Objective: Filed Vitals:   04/28/13 1442  BP: 110/51  Pulse: 73  Temp: 97.4 F (36.3 C)  Resp: 20    Intake/Output Summary (Last 24 hours) at 04/28/13 1643 Last data filed at 04/28/13 1500  Gross per 24 hour  Intake   2880 ml  Output   2501 ml  Net    379 ml   Filed Weights   04/27/13 1020 04/27/13 1022  Weight: 68.947 kg (152 lb) 68.9 kg (151 lb 14.4 oz)    Exam:   General:  AAAOx3  Cardiovascular: S1S2/RRR  Respiratory: CTAB  Abdomen: soft, Nt, BS present  Musculoskeletal: no edema c/c   Data Reviewed: Basic Metabolic Panel:  Recent Labs Lab 04/27/13 0545 04/27/13 0753 04/28/13 0110  NA 139 139 141  K 3.7 3.9 3.2*  CL 102 103 107  CO2  --   --  25  GLUCOSE 130* 108* 90  BUN 24* 26* 16  CREATININE 0.70 0.80 0.75  CALCIUM  --   --  8.3*   Liver Function Tests: No results found for this basename: AST, ALT, ALKPHOS, BILITOT, PROT, ALBUMIN,  in the last 168 hours No  results found for this basename: LIPASE, AMYLASE,  in the last 168 hours No results found for this basename: AMMONIA,  in the last 168 hours CBC:  Recent Labs Lab 04/27/13 0507 04/27/13 0545 04/27/13 0753 04/28/13 0110  WBC 47.8*  --   --  40.5*  NEUTROABS 44.0*  --   --   --   HGB 5.6* 6.5* 6.5* 8.5*  HCT 16.5* 19.0* 19.0* 24.2*  MCV 97.1  --   --  90.0  PLT 199  --   --  201   Cardiac Enzymes:  Recent Labs Lab 04/27/13 1845 04/28/13 0110 04/28/13 0650  TROPONINI <0.30 <0.30 <0.30   BNP (last 3 results) No results found for this basename: PROBNP,  in the last 8760 hours CBG: No results found for this basename: GLUCAP,  in the last 168 hours  No results found for this or any previous visit (from the past 240 hour(s)).   Studies: Ct Angio Chest W/cm &/or Wo Cm  04/27/2013   *RADIOLOGY REPORT*  Clinical Data: Syncopal episode approximately 3 o'clock this morning the patient arose from bed.  Prior history of breast cancer.  Leukocytosis with white blood count of 47,800.  CT ANGIOGRAPHY CHEST  Technique:  Multidetector CT imaging of the chest using the standard protocol during bolus administration of intravenous contrast. Multiplanar reconstructed images including MIPs were obtained and reviewed to evaluate the vascular anatomy.  Contrast: OMNIPAQUE IOHEXOL 350 MG/ML SOLNV.  Comparison: None.  Findings: Contrast opacification of the pulmonary arteries is very good.  No filling defects within either main pulmonary artery or their branches in either lung to suggest pulmonary embolism.  Heart mildly enlarged with left ventricular predominance and mild left ventricular hypertrophy.  Moderate sized pericardial effusion. Moderate LAD coronary atherosclerosis.  Moderate atherosclerosis involving the thoracic aorta without aneurysm or dissection. Proximal great vessels widely patent.  Left subclavian Port-A-Cath tip in the upper right atrium.  No significant mediastinal, hilar, or  axillary lymphadenopathy. Approximate 2.5 x 2.3 cm fluid collection in the right axilla. Visualized thyroid gland unremarkable.  Mild emphysematous changes in both lungs.  No confluent airspace consolidation.  No evidence of interstitial lung disease.  No pulmonary parenchymal nodules or masses.  Mild scarring in the lower lobes.  Central airways patent without significant bronchial wall thickening.  No pleural effusions.  Visualized upper abdomen unremarkable.  Anatomic variant in that the left lobe of the liver extends well across the midline into the left upper quadrant.  Bone window images demonstrate thoracic spondylosis and upper thoracic scoliosis convex left with compensatory lower thoracic scoliosis convex right.  IMPRESSION:  1.  No evidence of pulmonary embolism. 2.  Moderate-sized pericardial effusion. 3.  Mild cardiomegaly with left ventricular enlargement and left ventricular hypertrophy.  Moderate LAD coronary atherosclerosis. 4.  Mild COPD/emphysema.  No acute pulmonary disease. 5.  Approximate 2.5 cm fluid collection in the right axilla consistent with a post-operative seroma or resolving hematoma.   Original Report Authenticated By: Hulan Saas, M.D.    Scheduled Meds: . multivitamin with minerals  1 tablet Oral Daily  . sodium chloride  3 mL Intravenous Q12H   Continuous Infusions: . sodium chloride      Principal Problem:   Syncope and collapse Active Problems:   Essential hypertension, benign   Anemia   Cancer of central portion of female breast   Leukocytosis   Pericardial effusion    Time spent:27min    [facility]  [facility] Pager [phone]. If 7PM-7AM, please contact night-coverage at [email or web address], password [facility] 04/28/2013, 4:43 PM  LOS: 1 day

## 2013-04-29 ENCOUNTER — Ambulatory Visit: Payer: Medicare Other

## 2013-04-29 ENCOUNTER — Encounter (HOSPITAL_COMMUNITY): Payer: Self-pay | Admitting: Gastroenterology

## 2013-04-29 LAB — CBC
MCH: 31.7 pg (ref 26.0–34.0)
MCHC: 34.3 g/dL (ref 30.0–36.0)
MCV: 92.5 fL (ref 78.0–100.0)
Platelets: 201 10*3/uL (ref 150–400)
RDW: 17.3 % — ABNORMAL HIGH (ref 11.5–15.5)

## 2013-04-29 LAB — BASIC METABOLIC PANEL
Calcium: 8.2 mg/dL — ABNORMAL LOW (ref 8.4–10.5)
Creatinine, Ser: 0.78 mg/dL (ref 0.50–1.10)
GFR calc Af Amer: >90 mL/min (ref >90)

## 2013-04-29 MED ORDER — PEG 3350-KCL-NA BICARB-NACL 420 G PO SOLR
4000.0000 mL | Freq: Once | ORAL | Status: AC
  Administered 2013-04-29: 4000 mL via ORAL

## 2013-04-29 MED ORDER — SODIUM CHLORIDE 0.9 % IV SOLN: INTRAVENOUS | Status: DC

## 2013-04-29 NOTE — Progress Notes (Signed)
[facility] PROGRESS NOTE  Tabitha Ramirez OZH:[id number] DOB: [birth year] DOA: 04/27/2013 PCP: Vivi Barrack, MD  Assessment/Plan: Syncope  -Suspect related to her anemia  -no events on tele  Anemia  -Suspect acute blood loss on top of chronic disease related to breast cancer and ongoing chemotherapy.  -Hb was 8.7 3 days prior with positive FOBT.  -transfused 2 units of PRBC 8/3.  -s/p EGD 8/4 which was normal -plan for colonoscopy tomorrow   Pericardial Effusion  -Noted incidentally on CT scan to r/o PE.  -2D echo normal with no pericardial effusion and grade 2 diastolic dysfunction  Leukocytosis  -Likely 2/2 neulasta received after last week.   Breast Cancer  -hold chemo acutely -Dr. Darnelle Catalan added as a consult.   HTN  -Hold BP meds for now.   DVT Prophylaxis  -SCDs.   Code Status  -Full code Family communication: none at bedside Disposition: home tomorrow if workup negative   Consultants:  [facility]  HPI/Subjective: Feels ok, no complaints  Objective: Filed Vitals:   04/29/13 0517  BP: 130/79  Pulse: 88  Temp: 97.7 F (36.5 C)  Resp: 18    Intake/Output Summary (Last 24 hours) at 04/29/13 1142 Last data filed at 04/29/13 0900  Gross per 24 hour  Intake   2375 ml  Output   1301 ml  Net   1074 ml   Filed Weights   04/27/13 1020 04/27/13 1022  Weight: 68.947 kg (152 lb) 68.9 kg (151 lb 14.4 oz)    Exam:   General:  AAAOx3  Cardiovascular: S1S2/RRR  Respiratory: CTAB  Abdomen: soft, Nt, BS present  Musculoskeletal: no edema c/c   Data Reviewed: Basic Metabolic Panel:  Recent Labs Lab 04/27/13 0545 04/27/13 0753 04/28/13 0110 04/29/13 0750  NA 139 139 141 139  K 3.7 3.9 3.2* 3.5  CL 102 103 107 106  CO2  --   --  25 26  GLUCOSE 130* 108* 90 91  BUN 24* 26* 16 8  CREATININE 0.70 0.80 0.75 0.78  CALCIUM  --   --  8.3* 8.2*   Liver Function Tests: No results found for this basename: AST, ALT, ALKPHOS, BILITOT, PROT,  ALBUMIN,  in the last 168 hours No results found for this basename: LIPASE, AMYLASE,  in the last 168 hours No results found for this basename: AMMONIA,  in the last 168 hours CBC:  Recent Labs Lab 04/27/13 0507 04/27/13 0545 04/27/13 0753 04/28/13 0110 04/29/13 0750  WBC 47.8*  --   --  40.5* 29.1*  NEUTROABS 44.0*  --   --   --   --   HGB 5.6* 6.5* 6.5* 8.5* 8.0*  HCT 16.5* 19.0* 19.0* 24.2* 23.3*  MCV 97.1  --   --  90.0 92.5  PLT 199  --   --  201 201   Cardiac Enzymes:  Recent Labs Lab 04/27/13 1845 04/28/13 0110 04/28/13 0650  TROPONINI <0.30 <0.30 <0.30   BNP (last 3 results) No results found for this basename: PROBNP,  in the last 8760 hours CBG: No results found for this basename: GLUCAP,  in the last 168 hours  No results found for this or any previous visit (from the past 240 hour(s)).   Studies: No results found.  Scheduled Meds: . multivitamin with minerals  1 tablet Oral Daily  . polyethylene glycol-electrolytes  4,000 mL Oral Once  . sodium chloride  3 mL Intravenous Q12H   Continuous Infusions: . sodium chloride 75 mL/hr at  04/29/13 0308    Principal Problem:   Syncope and collapse Active Problems:   Essential hypertension, benign   Anemia   Cancer of central portion of female breast   Leukocytosis   Pericardial effusion    Time spent:75min    [facility]  [facility] Pager [phone]. If 7PM-7AM, please contact night-coverage at [email or web address], password [facility] 04/29/2013, 11:42 AM  LOS: 2 days

## 2013-04-29 NOTE — Progress Notes (Signed)
The patient has no complaints today. Her EGD was negative. There was nothing on that exam to explain anemia and heme positive stool. We discussed colonoscopy today. I going to set her up for a colonoscopy tomorrow.

## 2013-04-30 ENCOUNTER — Encounter (HOSPITAL_COMMUNITY): Payer: Self-pay | Admitting: *Deleted

## 2013-04-30 ENCOUNTER — Encounter (HOSPITAL_COMMUNITY)
Admission: EM | Disposition: A | Discharge status: Home / Self Care | Payer: Self-pay | Source: Home / Self Care | Attending: Internal Medicine

## 2013-04-30 ENCOUNTER — Other Ambulatory Visit: Payer: Self-pay

## 2013-04-30 HISTORY — PX: COLONOSCOPY: SHX5424

## 2013-04-30 LAB — CBC
MCHC: 34.6 g/dL (ref 30.0–36.0)
Platelets: 209 10*3/uL (ref 150–400)
RDW: 17.5 % — ABNORMAL HIGH (ref 11.5–15.5)
WBC: 24.6 10*3/uL — ABNORMAL HIGH (ref 4.0–10.5)

## 2013-04-30 LAB — BASIC METABOLIC PANEL
Chloride: 106 mEq/L (ref 96–112)
Creatinine, Ser: 0.75 mg/dL (ref 0.50–1.10)
GFR calc Af Amer: >90 mL/min (ref >90)
GFR calc non Af Amer: 87 mL/min — ABNORMAL LOW (ref >90)
Potassium: 3.2 mEq/L — ABNORMAL LOW (ref 3.5–5.1)

## 2013-04-30 SURGERY — COLONOSCOPY
Anesthesia: Moderate Sedation

## 2013-04-30 MED ORDER — FENTANYL CITRATE 0.05 MG/ML IJ SOLN
INTRAMUSCULAR | Status: DC | PRN
  Administered 2013-04-30: 25 ug via INTRAVENOUS

## 2013-04-30 MED ORDER — POTASSIUM CHLORIDE CRYS ER 20 MEQ PO TBCR
40.0000 meq | EXTENDED_RELEASE_TABLET | Freq: Once | ORAL | Status: AC
  Administered 2013-04-30: 40 meq via ORAL
  Filled 2013-04-30: qty 2

## 2013-04-30 MED ORDER — MIDAZOLAM HCL 5 MG/5ML IJ SOLN
INTRAMUSCULAR | Status: DC | PRN
  Administered 2013-04-30: 2 mg via INTRAVENOUS

## 2013-04-30 MED ORDER — PANTOPRAZOLE SODIUM 40 MG PO TBEC: 40.0000 mg | DELAYED_RELEASE_TABLET | Freq: Every day | ORAL | Status: DC

## 2013-04-30 MED ORDER — SODIUM CHLORIDE 0.9 % IJ SOLN
10.0000 mL | INTRAMUSCULAR | Status: DC | PRN
  Administered 2013-04-30: 10 mL

## 2013-04-30 MED ORDER — HEPARIN SOD (PORK) LOCK FLUSH 100 UNIT/ML IV SOLN
500.0000 [IU] | INTRAVENOUS | Status: AC | PRN
  Administered 2013-04-30: 500 [IU]

## 2013-04-30 MED ORDER — PANTOPRAZOLE SODIUM 40 MG PO TBEC
40.0000 mg | DELAYED_RELEASE_TABLET | Freq: Every day | ORAL | Status: DC
  Administered 2013-04-30: 40 mg via ORAL
  Filled 2013-04-30: qty 1

## 2013-04-30 NOTE — Op Note (Signed)
[facility] [address] [place], [address]   COLONOSCOPY PROCEDURE REPORT  PATIENT: Tabitha, Ramirez  MR#: [id number] BIRTHDATE: 07-04-1946 , 66  yrs. old GENDER: Female ENDOSCOPIST: Wandalee Ferdinand, MD REFERRED BY: PROCEDURE DATE:  04/30/2013 PROCEDURE:    colonoscopy with biopsy ASA CLASS:   3 INDICATIONS:heme positive stool, anemia MEDICATIONS: fentanyl 50 mcg IV, Versed 5 mg IV  DESCRIPTION OF PROCEDURE:   After the risks benefits and alternatives of the procedure were thoroughly explained, informed consent was obtained.  digital rectal exam was normal        The Pentax Ped Colon [id number]  endoscope was introduced through the anus and advanced to the cecum     . No adverse events experienced. The quality of the prep was good       The instrument was then slowly withdrawn as the colon was fully examined.    At the base of the cecum there was a small 3 mm polyp that was removed with cold forceps. The cecum otherwise looked normal. The ascending colon was normal.the transverse colon was normal. The descending colon sigmoid and rectum were normal.    The time to cecum=  .  Withdrawal time=  .  The scope was withdrawn and the procedure completed. COMPLICATIONS: There were no complications.  ENDOSCOPIC IMPRESSION:see above see above RECOMMENDATIONS:no further GI tests are planned at this time. We will sign off. Call us if needed.   eSigned:  Wandalee Ferdinand, MD 04/30/2013 11:33 AM   cc:

## 2013-05-01 ENCOUNTER — Encounter (HOSPITAL_COMMUNITY): Payer: Self-pay | Admitting: Gastroenterology

## 2013-05-04 ENCOUNTER — Ambulatory Visit
Admission: RE | Admit: 2013-05-04 | Discharge: 2013-05-04 | Disposition: A | Discharge status: Home / Self Care | Payer: Medicare Other | Source: Ambulatory Visit | Attending: Oncology | Admitting: Oncology

## 2013-05-04 DIAGNOSIS — C50111 Malignant neoplasm of central portion of right female breast: Secondary | ICD-10-CM

## 2013-05-04 MED ORDER — GADOBENATE DIMEGLUMINE 529 MG/ML IV SOLN
14.0000 mL | Freq: Once | INTRAVENOUS | Status: AC | PRN
  Administered 2013-05-04: 14 mL via INTRAVENOUS

## 2013-05-05 ENCOUNTER — Telehealth: Payer: Self-pay | Admitting: Oncology

## 2013-05-05 ENCOUNTER — Ambulatory Visit (HOSPITAL_BASED_OUTPATIENT_CLINIC_OR_DEPARTMENT_OTHER): Payer: Medicare Other | Admitting: Physician Assistant

## 2013-05-05 ENCOUNTER — Encounter: Payer: Self-pay | Admitting: Physician Assistant

## 2013-05-05 ENCOUNTER — Other Ambulatory Visit (HOSPITAL_BASED_OUTPATIENT_CLINIC_OR_DEPARTMENT_OTHER): Payer: Medicare Other | Admitting: Lab

## 2013-05-05 VITALS — BP 119/71 | HR 85 | Temp 98.6°F | Resp 20 | Ht 64.0 in | Wt 152.2 lb

## 2013-05-05 DIAGNOSIS — C50111 Malignant neoplasm of central portion of right female breast: Secondary | ICD-10-CM

## 2013-05-05 DIAGNOSIS — C50119 Malignant neoplasm of central portion of unspecified female breast: Secondary | ICD-10-CM

## 2013-05-05 DIAGNOSIS — Z171 Estrogen receptor negative status [ER-]: Secondary | ICD-10-CM

## 2013-05-05 DIAGNOSIS — D649 Anemia, unspecified: Secondary | ICD-10-CM

## 2013-05-05 LAB — COMPREHENSIVE METABOLIC PANEL (CC13)
Albumin: 3.2 g/dL — ABNORMAL LOW (ref 3.5–5.0)
Alkaline Phosphatase: 102 U/L (ref 40–150)
BUN: 12.6 mg/dL (ref 7.0–26.0)
Calcium: 9.1 mg/dL (ref 8.4–10.4)
Glucose: 94 mg/dl (ref 70–140)
Potassium: 3.6 mEq/L (ref 3.5–5.1)

## 2013-05-05 LAB — CBC WITH DIFFERENTIAL/PLATELET
Basophils Absolute: 0.1 10*3/uL (ref 0.0–0.1)
EOS%: 0.3 % (ref 0.0–7.0)
HCT: 27.7 % — ABNORMAL LOW (ref 34.8–46.6)
HGB: 9.1 g/dL — ABNORMAL LOW (ref 11.6–15.9)
MCH: 31.3 pg (ref 25.1–34.0)
MCV: 95.2 fL (ref 79.5–101.0)
NEUT%: 73.2 % (ref 38.4–76.8)
lymph#: 1.1 10*3/uL (ref 0.9–3.3)

## 2013-05-05 NOTE — Progress Notes (Signed)
ID: Tabitha Ramirez OB: 01-28-1946  MR#: [id number]  CSN#:[id number]  PCP: Vivi Barrack, MD GYN:  Lavina Hamman SU: Claud Kelp OTHER MD: Lurline Hare   HISTORY OF PRESENT ILLNESS: Tabitha Ramirez tells me she felt a mass in her right breast about 3 months ago but initially ignored it. She eventually brought it to her physician's attention and was set up for bilateral diagnostic mammography at the [facility] Center 01/31/2013. This showed an area of distortion in the upper outer quadrant measuring approximately 4.3 cm. Diffuse calcifications in both breasts were stable. The mass was palpable at 12:00, 3 cm from the nipple, and by ultrasound it was irregular, hypoechoic, and measured 2.5 cm. In addition there was prominent right axillary adenopathy the largest lymph node measuring 2.5 cm.  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 Biopsy of the lymph node was negative. It did show some lymph node tissue.  Bilateral breast MRI 02/10/2013 showed a ring-enhancing necrotic-appearing 3.4 cm mass in the anterior third of the right breast. There was also a 2.0 cm level one right axillary lymph node that was prominent, but maintains central fat. The left breast the left axilla were benign and there was no internal mammary adenopathy noted.  The patient's subsequent history is as detailed below.  INTERVAL HISTORY: Tabitha Ramirez returns today accompanied by her daughter [name] for followup of her locally advanced right breast carcinoma.   Interval history is remarkable for Tabitha Ramirez having been hospitalized last week after an episode of syncope and loss of consciousness. She was found to have a hemoglobin of 5.6. (This was a drop from 8.7 when she was seen here in our office on 04/21/2013.)  She underwent both an endoscopy and colonoscopy during hospitalization, both of which were basically  unremarkable with the exception of a benign appearing polyp. She was started on protonic, and was advised to discontinue both Aleve and dexamethasone.   With his energy level has improved. She has seen no additional signs of abnormal bleeding whatsoever. Her hemoglobin has improved, currently at 9.1, status post blood transfusion while in the hospital.   She is currently day 8 cycle 3 of 4 planned dose dense cycles of doxorubicin/cyclophosphamide being given in the neoadjuvant setting. She receives Neulasta on day 2 for granulocyte support.    REVIEW OF SYSTEMS: Elsy denies any fevers or chills. She's had no skin changes, abnormal bruising, or abnormal bleeding. Her platelets are normal today at 363,000. Her appetite is good, and she denies any problems with nausea or change in bowel habits. She previously complained of jaw pain which is now completely resolved. She's had no mouth ulcers or oral sensitivity. She's had no cough, shortness of breath, or chest pain. She denies any abnormal headaches, and currently has no dizziness. She's had no additional episodes of syncope, no additional loss of consciousness. She currently denies any unusual myalgias, arthralgias, bony pain, or peripheral swelling.   A detailed review of systems is otherwise noncontributory.    PAST MEDICAL HISTORY: Past Medical History  Diagnosis Date  . Heart murmur   . Hypertension   . Anemia   . Allergy   . Arthritis   . Breast cancer   . Migraines     PAST SURGICAL HISTORY: Past Surgical History  Procedure Laterality Date  . Abdominal hysterectomy      With bilateral salpingo-oophorectomy  . Portacath  placement N/A 03/10/2013    Procedure: INSERTION PORT-A-CATH WITH FLUOROSCOPY AND ULTRASOUND;  Surgeon: Ernestene Mention, MD;  Location: [facility] OR;  Service: General;  Laterality: N/A;  . Axillary lymph node biopsy Right 03/10/2013    Procedure: AXILLARY LYMPH NODE BIOPSY;  Surgeon: Ernestene Mention, MD;  Location: [facility]  OR;  Service: General;  Laterality: Right;  sentinel node with blue dye  . Esophagogastroduodenoscopy N/A 04/28/2013    Procedure: ESOPHAGOGASTRODUODENOSCOPY (EGD);  Surgeon: Graylin Shiver, MD;  Location: [facility];  Service: Endoscopy;  Laterality: N/A;  . Colonoscopy N/A 04/30/2013    Procedure: COLONOSCOPY;  Surgeon: Graylin Shiver, MD;  Location: [facility] ENDOSCOPY;  Service: Endoscopy;  Laterality: N/A;    FAMILY HISTORY Family History  Problem Relation Age of Onset  . Cancer Mother   . Congestive Heart Failure Mother   . Cancer Brother   . Diabetes Brother   . Heart disease Brother     AMI 05/22/2012  . Congestive Heart Failure Brother   . Cancer Maternal Grandmother    the patient's father died in an automobile accident at the age of 44. The patient's mother lived to be 28. Tabitha Ramirez has 3 brothers, no sisters. One brother had colon cancer at the age of 55. The patient's mother and the patient's mothers mother (maternal grandmother) both were diagnosed with some kind of cancer at the age of 50 or thereabouts, but Bill does not know what type of cancer this may have been.  GYNECOLOGIC HISTORY:  Menarche age 22, first live birth age 61. The patient had her hysterectomy in the 1970s. She has been on estrogen replacement for she thinks 18 years.  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 She is divorced, lives by herself, with no pets. Her first child was given up for adoption. Her second child, Tabitha Ramirez, is a Geophysicist/field seismologist and is working towards a PhD at [employer]. Tabitha Ramirez has no grandchildren. She attends new [employer]    ADVANCED DIRECTIVES: In place. The patient has named her daughter [name] as her healthcare power of attorney. [name] can be reached at [phone]. The healthcare power of attorney documents were brought by the patient 03/13/2013 and are  separately scanned.   HEALTH MAINTENANCE: History  Substance Use Topics  . Smoking status: Former Smoker    Types: Cigarettes  . Smokeless tobacco: Never Used     Comment: smoked in college 1 yr /occ wine  . Alcohol Use: No     Colonoscopy: August 2012  PAP:  Bone density: Never  Lipid panel:  Allergies  Allergen Reactions  . Penicillins Rash    Current Outpatient Prescriptions  Medication Sig Dispense Refill  . Acetaminophen (TYLENOL EXTRA STRENGTH PO) Take 1 tablet by mouth as needed.      . lidocaine-prilocaine (EMLA) cream Apply 1 application topically as needed (port access). Apply to port area 1-2 hours before chemotherapy; cover with plastioc wrap      . LORazepam (ATIVAN) 0.5 MG tablet Take 1 tablet (0.5 mg total) by mouth at bedtime as needed for anxiety.  20 tablet  0  . Multiple Vitamin (MULTIVITAMIN WITH MINERALS) TABS Take 1 tablet by mouth daily.      . pantoprazole (PROTONIX) 40 MG tablet Take 1 tablet (40 mg total) by mouth daily at 12 noon.  30 tablet  0  . prochlorperazine (COMPAZINE)  10 MG tablet Take 10 mg by mouth every 6 (six) hours as needed (nausea/anxiety).      . triamterene-hydrochlorothiazide (MAXZIDE-25) 37.5-25 MG per tablet Take 1 tablet by mouth daily.  30 tablet  3  . fluconazole (DIFLUCAN) 100 MG tablet Take 200 mg by mouth See admin instructions. 2 tabs by mouth x 1 day, then 1 tab by mouth daily       No current facility-administered medications for this visit.    OBJECTIVE: Middle-aged [place] American woman who appears tired  Filed Vitals:   05/05/13 1314  BP: 119/71  Pulse: 85  Temp: 98.6 F (37 C)  Resp: 20     Body mass index is 26.11 kg/(m^2).    ECOG FS: 1 Filed Weights   05/05/13 1314  Weight: 152 lb 3.2 oz (69.037 kg)   Sclerae unicteric Oropharynx is clear No cervical or supraclavicular adenopathy Lungs clear to auscultation, good excursion bilaterally Heart regular rate and rhythm Abdomen soft, nontender, positive  bowel sounds MSK no focal spinal tenderness No peripheral edema Neuro: nonfocal, well oriented, appropriate affect Breasts: Deferred.   Port is intact in the left upper chest wall . There is some mild hyperpigmentation of the nailbeds and hands bilaterally, but with no drainage or evidence of infection.   LAB RESULTS:   Lab Results  Component Value Date   WBC 10.4* 05/05/2013   NEUTROABS 7.7* 05/05/2013   HGB 9.1* 05/05/2013   HCT 27.7* 05/05/2013   MCV 95.2 05/05/2013   PLT 363 05/05/2013      Chemistry      Component Value Date/Time   NA 140 05/05/2013 1228   NA 141 04/30/2013 0530   K 3.6 05/05/2013 1228   K 3.2* 04/30/2013 0530   CL 106 04/30/2013 0530   CL 100 03/13/2013 0918   CO2 28 05/05/2013 1228   CO2 26 04/30/2013 0530   BUN 12.6 05/05/2013 1228   BUN 5* 04/30/2013 0530   CREATININE 0.9 05/05/2013 1228   CREATININE 0.75 04/30/2013 0530   CREATININE 0.94 11/29/2011 1535      Component Value Date/Time   CALCIUM 9.1 05/05/2013 1228   CALCIUM 8.7 04/30/2013 0530   ALKPHOS 102 05/05/2013 1228   ALKPHOS 96 02/28/2013 0937   AST 17 05/05/2013 1228   AST 27 02/28/2013 0937   ALT 21 05/05/2013 1228   ALT 27 02/28/2013 0937   BILITOT 0.21 05/05/2013 1228   BILITOT 0.3 02/28/2013 0937       STUDIES: No results found.   ASSESSMENT: 67 y.o. [place] woman status post right central breast biopsy 01/31/2013 for a clinical T2 NX, stage II invasive ductal carcinoma, grade 3, triple negative, with an MIB-1 of 84%  (1) biopsy of an enlarged (2.5 cm) right axillary lymph node 01/31/2013 was negative, felt possibly discordant  (2) sentinel lymph node sampling 03/10/2013 showed one of two sentinel nodes to be positive; repeat prognostic profile pending  (3) neoadjuvant doxorubicin and cyclophosphamide being given in dose dense fashion with Neulasta support starting 03/17/2013, to be followed by weekly carboplatin and paclitaxel x12  (4)  status post hospitalization in early August 2014 secondary to a  significant drop in hemoglobin leading to syncope and loss of consciousness.   PLAN: Verlon Au going to hold Tesslyn's last cycle of dose dense doxorubicin/cyclophosphamide today and give her another week to recover.  We'll continue to follow her very closely, especially for any decrease in her hemoglobin. She is already scheduled to see Dr. Darnelle Catalan next  week on August 18, and we will plan on treating her with day 1 cycle 4 of doxorubicin/cyclophosphamide at that time. Hopefully we will have the final results from her breast MRI at that time as well. Two weeks later on September 2, we hope to initiate her first of 12 weekly doses of paclitaxel/carboplatin.  I will mention that since we are discontinuing dexamethasone (likely secondary to the increased acid production associated with those medications), I have added ondansetron to her antinausea regimen, one tablet twice daily for 2-3 days after each dose of chemotherapy. Of course she will continue on prochlorperazine as before. She'll also continue on Protonix as prescribed during her hospitalization.  If she has any signs of abnormal bleeding whatsoever, specifically any blood in the stool, she'll contact our office or report immediately to the emergency room.  All this was reviewed in detail with Tinisha and her daughter [name] today. They voiced understanding and agreement with this plan and will call with any changes or problems.   Kristjan Derner, PA-C   05/05/2013 4:08 PM

## 2013-05-05 NOTE — Telephone Encounter (Signed)
, °

## 2013-05-06 ENCOUNTER — Other Ambulatory Visit: Payer: Self-pay | Admitting: *Deleted

## 2013-05-06 MED ORDER — ONDANSETRON HCL 8 MG PO TABS: 8.0000 mg | ORAL_TABLET | Freq: Two times a day (BID) | ORAL | Status: DC | PRN

## 2013-05-07 ENCOUNTER — Encounter: Payer: Self-pay | Admitting: Oncology

## 2013-05-07 NOTE — Progress Notes (Signed)
Patient left a message to call about financial asst. I called her back and based on her income she would not qualify for any grants with [place]. I did give her M Vogelsinger's ph# to see if [employer] had some other resources to help. I advised her to call billing and get set up on pmt plan. Based on bal now- she will not be able to apply for hardship.

## 2013-05-12 ENCOUNTER — Other Ambulatory Visit (HOSPITAL_BASED_OUTPATIENT_CLINIC_OR_DEPARTMENT_OTHER): Payer: Medicare Other | Admitting: Lab

## 2013-05-12 ENCOUNTER — Ambulatory Visit (HOSPITAL_BASED_OUTPATIENT_CLINIC_OR_DEPARTMENT_OTHER): Payer: Medicare Other | Admitting: Oncology

## 2013-05-12 ENCOUNTER — Ambulatory Visit (HOSPITAL_BASED_OUTPATIENT_CLINIC_OR_DEPARTMENT_OTHER): Payer: Medicare Other

## 2013-05-12 VITALS — BP 139/72 | HR 88 | Temp 98.6°F | Resp 20 | Ht 64.0 in | Wt 155.6 lb

## 2013-05-12 DIAGNOSIS — C50919 Malignant neoplasm of unspecified site of unspecified female breast: Secondary | ICD-10-CM

## 2013-05-12 DIAGNOSIS — Z5111 Encounter for antineoplastic chemotherapy: Secondary | ICD-10-CM

## 2013-05-12 DIAGNOSIS — C50111 Malignant neoplasm of central portion of right female breast: Secondary | ICD-10-CM

## 2013-05-12 DIAGNOSIS — C50119 Malignant neoplasm of central portion of unspecified female breast: Secondary | ICD-10-CM

## 2013-05-12 DIAGNOSIS — Z171 Estrogen receptor negative status [ER-]: Secondary | ICD-10-CM

## 2013-05-12 LAB — CBC WITH DIFFERENTIAL/PLATELET
BASO%: 3.6 % — ABNORMAL HIGH (ref 0.0–2.0)
Basophils Absolute: 0.3 10*3/uL — ABNORMAL HIGH (ref 0.0–0.1)
EOS%: 3 % (ref 0.0–7.0)
Eosinophils Absolute: 0.2 10*3/uL (ref 0.0–0.5)
HGB: 9.3 g/dL — ABNORMAL LOW (ref 11.6–15.9)
LYMPH%: 19.7 % (ref 14.0–49.7)
MCV: 96.6 fL (ref 79.5–101.0)
MONO#: 1 10*3/uL — ABNORMAL HIGH (ref 0.1–0.9)
MONO%: 14.7 % — ABNORMAL HIGH (ref 0.0–14.0)
NEUT#: 4.1 10*3/uL (ref 1.5–6.5)
NEUT%: 59 % (ref 38.4–76.8)
RDW: 19.2 % — ABNORMAL HIGH (ref 11.2–14.5)
WBC: 7 10*3/uL (ref 3.9–10.3)
lymph#: 1.4 10*3/uL (ref 0.9–3.3)
nRBC: 0 % (ref 0–0)

## 2013-05-12 MED ORDER — HEPARIN SOD (PORK) LOCK FLUSH 100 UNIT/ML IV SOLN
500.0000 [IU] | Freq: Once | INTRAVENOUS | Status: AC | PRN
  Administered 2013-05-12: 500 [IU]
  Filled 2013-05-12: qty 5

## 2013-05-12 MED ORDER — DEXAMETHASONE SODIUM PHOSPHATE 20 MG/5ML IJ SOLN
12.0000 mg | Freq: Once | INTRAMUSCULAR | Status: AC
  Administered 2013-05-12: 12 mg via INTRAVENOUS

## 2013-05-12 MED ORDER — SODIUM CHLORIDE 0.9 % IV SOLN
150.0000 mg | Freq: Once | INTRAVENOUS | Status: AC
  Administered 2013-05-12: 150 mg via INTRAVENOUS
  Filled 2013-05-12: qty 5

## 2013-05-12 MED ORDER — SODIUM CHLORIDE 0.9 % IJ SOLN
10.0000 mL | INTRAMUSCULAR | Status: DC | PRN
  Administered 2013-05-12: 10 mL
  Filled 2013-05-12: qty 10

## 2013-05-12 MED ORDER — PALONOSETRON HCL INJECTION 0.25 MG/5ML
0.2500 mg | Freq: Once | INTRAVENOUS | Status: AC
  Administered 2013-05-12: 0.25 mg via INTRAVENOUS

## 2013-05-12 MED ORDER — SODIUM CHLORIDE 0.9 % IV SOLN
Freq: Once | INTRAVENOUS | Status: AC
  Administered 2013-05-12: 13:00:00 via INTRAVENOUS

## 2013-05-12 MED ORDER — SODIUM CHLORIDE 0.9 % IV SOLN
600.0000 mg/m2 | Freq: Once | INTRAVENOUS | Status: AC
  Administered 2013-05-12: 1080 mg via INTRAVENOUS
  Filled 2013-05-12: qty 54

## 2013-05-12 MED ORDER — DOXORUBICIN HCL CHEMO IV INJECTION 2 MG/ML
60.0000 mg/m2 | Freq: Once | INTRAVENOUS | Status: AC
  Administered 2013-05-12: 108 mg via INTRAVENOUS
  Filled 2013-05-12: qty 54

## 2013-05-12 NOTE — Progress Notes (Signed)
ID: [name] OB: [birth year]  MR#: [id number]  CSN#:[id number]  PCP: Vivi Barrack, MD GYN:  Lavina Hamman SU: Claud Kelp OTHER MD: Lurline Hare   HISTORY OF PRESENT ILLNESS: Jarita tells me she felt a mass in her right breast about 3 months ago but initially ignored it. She eventually brought it to her physician's attention and was set up for bilateral diagnostic mammography at the breast Center 01/31/2013. This showed an area of distortion in the upper outer quadrant measuring approximately 4.3 cm. Diffuse calcifications in both breasts were stable. The mass was palpable at 12:00, 3 cm from the nipple, and by ultrasound it was irregular, hypoechoic, and measured 2.5 cm. In addition there was prominent right axillary adenopathy the largest lymph node measuring 2.5 cm.  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 Biopsy of the lymph node was negative. It did show some lymph node tissue.  Bilateral breast MRI 02/10/2013 showed a ring-enhancing necrotic-appearing 3.4 cm mass in the anterior third of the right breast. There was also a 2.0 cm level one right axillary lymph node that was prominent, but maintains central fat. The left breast the left axilla were benign and there was no internal mammary adenopathy noted.  The patient's subsequent history is as detailed below.  INTERVAL HISTORY: Brissa returns today for followup of her locally advanced right breast carcinoma. Today is day 1 cycle 4 of her planned 4 cycles of doxorubicin and cyclophosphamide. This cycle had to be postponed one week because of symptomatic anemia requiring hospitalization for transfusion.  REVIEW OF SYSTEMS: Seynabou is feeling "much better". She is now able to do all her household activities. She is now walking on a regular basis, but she does "walk around the house" and tries to keep  herself "out of the chair". She continues to have pain in her right arm point, which is very intermittent, and feels achy him a note for or burning. She tolerated the transfusions with no side effects that she is aware of. Currently she has no shortness of breath, pleurisy, dizziness, or palpitations. A detailed review of systems today was otherwise benign.  PAST MEDICAL HISTORY: Past Medical History  Diagnosis Date  . Heart murmur   . Hypertension   . Anemia   . Allergy   . Arthritis   . Breast cancer   . Migraines     PAST SURGICAL HISTORY: Past Surgical History  Procedure Laterality Date  . Abdominal hysterectomy      With bilateral salpingo-oophorectomy  . Portacath placement N/A 03/10/2013    Procedure: INSERTION PORT-A-CATH WITH FLUOROSCOPY AND ULTRASOUND;  Surgeon: Ernestene Mention, MD;  Location: [facility] OR;  Service: General;  Laterality: N/A;  . Axillary lymph node biopsy Right 03/10/2013    Procedure: AXILLARY LYMPH NODE BIOPSY;  Surgeon: Ernestene Mention, MD;  Location: [facility] OR;  Service: General;  Laterality: Right;  sentinel node with blue dye  . Esophagogastroduodenoscopy N/A 04/28/2013    Procedure: ESOPHAGOGASTRODUODENOSCOPY (EGD);  Surgeon: Graylin Shiver, MD;  Location: [facility];  Service: Endoscopy;  Laterality: N/A;  . Colonoscopy N/A 04/30/2013    Procedure: COLONOSCOPY;  Surgeon: Graylin Shiver, MD;  Location: [facility] END[facility]COPY;  Service: Endoscopy;  Laterality: N/A;    FAMILY HISTORY Family History  Problem Relation Age of Onset  . Cancer Mother   . Congestive Heart Failure Mother   .  Cancer Brother   . Diabetes Brother   . Heart disease Brother     AMI 05/22/2012  . Congestive Heart Failure Brother   . Cancer Maternal Grandmother    the patient's father died in an automobile accident at the age of 76. The patient's mother lived to be 34. Camara has 3 brothers, no sisters. One brother had colon cancer at the age of 36. The patient's mother and the patient's mothers  mother (maternal grandmother) both were diagnosed with some kind of cancer at the age of 28 or thereabouts, but Aleane does not know what type of cancer this may have been.  GYNECOLOGIC HISTORY:  Menarche age 41, first live birth age 70. The patient had her hysterectomy in the 1970s. She has been on estrogen replacement for she thinks 18 years.  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 She is divorced, lives by herself, with no pets. Her first child was given up for adoption. Her second child, [name], is a Geophysicist/field seismologist and is working towards a PhD at [employer]. Dashayla has no grandchildren. She attends new [employer]    ADVANCED DIRECTIVES: In place. The patient has named her daughter [name] as her healthcare power of attorney. [name] can be reached at [phone]. The healthcare power of attorney documents were brought by the patient 03/13/2013 and are separately scanned.   HEALTH MAINTENANCE: History  Substance Use Topics  . Smoking status: Former Smoker    Types: Cigarettes  . Smokeless tobacco: Never Used     Comment: smoked in college 1 yr /occ wine  . Alcohol Use: No     Colonoscopy: August 2012  PAP:  Bone density: Never  Lipid panel:  Allergies  Allergen Reactions  . Penicillins Rash    Current Outpatient Prescriptions  Medication Sig Dispense Refill  . Acetaminophen (TYLENOL EXTRA STRENGTH PO) Take 1 tablet by mouth as needed.      . fluconazole (DIFLUCAN) 100 MG tablet Take 200 mg by mouth See admin instructions. 2 tabs by mouth x 1 day, then 1 tab by mouth daily      . lidocaine-prilocaine (EMLA) cream Apply 1 application topically as needed (port access). Apply to port area 1-2 hours before chemotherapy; cover with plastioc wrap      . LORazepam (ATIVAN) 0.5 MG tablet Take 1 tablet (0.5 mg total) by mouth at bedtime as needed  for anxiety.  20 tablet  0  . Multiple Vitamin (MULTIVITAMIN WITH MINERALS) TABS Take 1 tablet by mouth daily.      . ondansetron (ZOFRAN) 8 MG tablet Take 1 tablet (8 mg total) by mouth every 12 (twelve) hours as needed for nausea.  20 tablet  3  . pantoprazole (PROTONIX) 40 MG tablet Take 1 tablet (40 mg total) by mouth daily at 12 noon.  30 tablet  0  . prochlorperazine (COMPAZINE) 10 MG tablet Take 10 mg by mouth every 6 (six) hours as needed (nausea/anxiety).      . triamterene-hydrochlorothiazide (MAXZIDE-25) 37.5-25 MG per tablet Take 1 tablet by mouth daily.  30 tablet  3   No current facility-administered medications for this visit.    OBJECTIVE: Middle-aged [place] American woman in no acute distress Filed Vitals:   05/12/13 1122  BP: 139/72  Pulse: 88  Temp: 98.6 F (37 C)  Resp: 20     Body mass index  is 26.7 kg/(m^2).    ECOG FS: 1 Filed Weights   05/12/13 1122  Weight: 155 lb 9.6 oz (70.58 kg)   Sclerae unicteric, pupils equal round and reactive to light Oropharynx is clear No cervical or supraclavicular adenopathy Lungs clear to auscultation, good excursion bilaterally Heart regular rate and rhythm, 1/6 systolic murmur noted Abdomen soft, nontender, positive bowel sounds MSK no focal spinal tenderness No peripheral edema Neuro: nonfocal, well oriented, positive affect Breasts: Deferred.   Port is intact in the left upper chest wall .   LAB RESULTS:   Lab Results  Component Value Date   WBC 7.0 05/12/2013   NEUTROABS 4.1 05/12/2013   HGB 9.3* 05/12/2013   HCT 28.4* 05/12/2013   MCV 96.6 05/12/2013   PLT 388 05/12/2013      Chemistry      Component Value Date/Time   NA 140 05/05/2013 1228   NA 141 04/30/2013 0530   K 3.6 05/05/2013 1228   K 3.2* 04/30/2013 0530   CL 106 04/30/2013 0530   CL 100 03/13/2013 0918   CO2 28 05/05/2013 1228   CO2 26 04/30/2013 0530   BUN 12.6 05/05/2013 1228   BUN 5* 04/30/2013 0530   CREATININE 0.9 05/05/2013 1228   CREATININE 0.75  04/30/2013 0530   CREATININE 0.94 11/29/2011 1535      Component Value Date/Time   CALCIUM 9.1 05/05/2013 1228   CALCIUM 8.7 04/30/2013 0530   ALKPHOS 102 05/05/2013 1228   ALKPHOS 96 02/28/2013 0937   AST 17 05/05/2013 1228   AST 27 02/28/2013 0937   ALT 21 05/05/2013 1228   ALT 27 02/28/2013 0937   BILITOT 0.21 05/05/2013 1228   BILITOT 0.3 02/28/2013 0937       STUDIES: Ct Angio Chest W/cm &/or Wo Cm  04/27/2013   *RADIOLOGY REPORT*  Clinical Data: Syncopal episode approximately 3 o'clock this morning the patient arose from bed.  Prior history of breast cancer.  Leukocytosis with white blood count of 47,800.  CT ANGIOGRAPHY CHEST  Technique:  Multidetector CT imaging of the chest using the standard protocol during bolus administration of intravenous contrast. Multiplanar reconstructed images including MIPs were obtained and reviewed to evaluate the vascular anatomy.  Contrast: OMNIPAQUE IOHEXOL 350 MG/ML SOLNV.  Comparison: None.  Findings: Contrast opacification of the pulmonary arteries is very good.  No filling defects within either main pulmonary artery or their branches in either lung to suggest pulmonary embolism.  Heart mildly enlarged with left ventricular predominance and mild left ventricular hypertrophy.  Moderate sized pericardial effusion. Moderate LAD coronary atherosclerosis.  Moderate atherosclerosis involving the thoracic aorta without aneurysm or dissection. Proximal great vessels widely patent.  Left subclavian Port-A-Cath tip in the upper right atrium.  No significant mediastinal, hilar, or axillary lymphadenopathy. Approximate 2.5 x 2.3 cm fluid collection in the right axilla. Visualized thyroid gland unremarkable.  Mild emphysematous changes in both lungs.  No confluent airspace consolidation.  No evidence of interstitial lung disease.  No pulmonary parenchymal nodules or masses.  Mild scarring in the lower lobes.  Central airways patent without significant bronchial wall thickening.   No pleural effusions.  Visualized upper abdomen unremarkable.  Anatomic variant in that the left lobe of the liver extends well across the midline into the left upper quadrant.  Bone window images demonstrate thoracic spondylosis and upper thoracic scoliosis convex left with compensatory lower thoracic scoliosis convex right.  IMPRESSION:  1.  No evidence of pulmonary embolism. 2.  Moderate-sized pericardial  effusion. 3.  Mild cardiomegaly with left ventricular enlargement and left ventricular hypertrophy.  Moderate LAD coronary atherosclerosis. 4.  Mild COPD/emphysema.  No acute pulmonary disease. 5.  Approximate 2.5 cm fluid collection in the right axilla consistent with a post-operative seroma or resolving hematoma.   Original Report Authenticated By: Hulan Saas, M.D.   Mr Breast Bilateral W Wo Contrast  05/05/2013   *RADIOLOGY REPORT*  Clinical Data:  The patient is status post chemotherapy for right breast cancer. Right axillary sentinel lymph node biopsy 03/10/2013, with one node positive for carcinoma.  MRI OF THE BILATERAL BREASTS WITHOUT AND WITH CONTRAST  Technique: Multiplanar, multisequence MR images of the right breast were obtained prior to and following the intravenous administration of 14ml of Multihance.  Comparison:  Recent imaging examinations.  FINDINGS:  Breast composition:  c:  Heterogeneous fibroglandular tissue  Background parenchymal enhancement:  Moderate  Left breast:  No mass or abnormal enhancement.  Right breast:  There is a necrotic right breast mass at 12 o'clock which has decreased in size.  This mass measures 2.5 x 1.9 x 1.9 cm versus 3.4 x 2.6 x 2.7 cm. There is a fluid collection in the right breast measuring 2.5 x 2.5 x 1.6 cm.  There is associated smooth peripheral enhancement.  This is in the region of the previously biopsied right lymph node and may represent a postoperative seroma/hematoma.  Mild right breast skin thickening is noted.  Lymph nodes:  No abnormal  appearing lymph nodes.  Ancillary findings:  Small right pleural effusion is noted.  There is a small amount of pericardial fluid.  IMPRESSION: 1. Interval decrease in size of right breast mass, measuring 2.5 x 1.9 x 1.9 cm versus 3.4 x 2.6 x 2.7 cm. 2.  Smooth peripherally enhancing fluid collection in the area of the previously biopsied right lymph node.  This most likely represents a postoperative collection. 3. Small right pleural effusion. 4. Small pericardial effusion.  RECOMMENDATION: Treatment planning.  BI-RADS CATEGORY 6:  Known biopsy-proven malignancy - appropriate action should be taken.  THREE-DIMENSIONAL MR IMAGE RENDERING ON INDEPENDENT WORKSTATION:  Three-dimensional MR images were rendered by post-processing of the original MR data on a DynaCad workstation.  The three-dimensional MR images were interpreted, and findings were reported in the accompanying complete MRI report for this study.   Original Report Authenticated By: Jerene Dilling, M.D.     ASSESSMENT: 67 y.o. [place] woman status post right central breast biopsy 01/31/2013 for a clinical T2 NX, stage II invasive ductal carcinoma, grade 3, triple negative, with an MIB-1 of 84%  (1) biopsy of an enlarged (2.5 cm) right axillary lymph node 01/31/2013 was negative, felt possibly discordant  (2) sentinel lymph node sampling 03/10/2013 showed one of two sentinel nodes to be positive; repeat prognostic profile pending  (3) neoadjuvant doxorubicin and cyclophosphamide being given in dose dense fashion with Neulasta support starting 03/17/2013, to be followed by weekly carboplatin and paclitaxel x12  (4)  status post hospitalization in early August 2014 secondary to a significant drop in hemoglobin leading to syncope and loss of consciousness.   PLAN: Verlon Au proceeding with her fourth and final cycle of cyclophosphamide and doxorubicin today. We're going to continue to monitor her labs and weekly basis to make sure her  hemoglobin does not again drop below 9, which he tolerates poorly. She will see [place] again next week at that point we will set her up for her weekly carboplatin and paclitaxel treatments which will start 2 weeks  from now. Today we did discuss the possible toxicities, side effects and complications of that chemotherapy, particularly concerns regarding peripheral neuropathy.  Stark Bray has a very good understanding of all this. She is very much in agreement. She knows to call for any problems that may develop before her next visit here.   Lowella Dell, MD   05/12/2013 12:04 PM

## 2013-05-13 ENCOUNTER — Encounter (INDEPENDENT_AMBULATORY_CARE_PROVIDER_SITE_OTHER): Payer: Self-pay | Admitting: General Surgery

## 2013-05-13 ENCOUNTER — Ambulatory Visit (HOSPITAL_BASED_OUTPATIENT_CLINIC_OR_DEPARTMENT_OTHER): Payer: Medicare Other

## 2013-05-13 ENCOUNTER — Other Ambulatory Visit: Payer: Self-pay | Admitting: Certified Registered Nurse Anesthetist

## 2013-05-13 ENCOUNTER — Ambulatory Visit (INDEPENDENT_AMBULATORY_CARE_PROVIDER_SITE_OTHER): Payer: Medicare Other | Admitting: General Surgery

## 2013-05-13 VITALS — BP 123/63 | HR 97 | Temp 98.0°F

## 2013-05-13 VITALS — BP 122/64 | HR 92 | Resp 16 | Ht 64.0 in | Wt 154.6 lb

## 2013-05-13 DIAGNOSIS — C50119 Malignant neoplasm of central portion of unspecified female breast: Secondary | ICD-10-CM

## 2013-05-13 DIAGNOSIS — C50111 Malignant neoplasm of central portion of right female breast: Secondary | ICD-10-CM

## 2013-05-13 MED ORDER — PEGFILGRASTIM INJECTION 6 MG/0.6ML
6.0000 mg | Freq: Once | SUBCUTANEOUS | Status: AC
  Administered 2013-05-13: 6 mg via SUBCUTANEOUS
  Filled 2013-05-13: qty 0.6

## 2013-05-13 NOTE — Patient Instructions (Signed)
Your recent MRI shows that the cancer in the central portion of the right breast has gotten a little bit smaller. It has gone from 3.5 cm to 2.5 cm in diameter. On exam I can still feel the tumor, and it appears to be right under the nipple and areola and extends laterally a little bit.  Continue your chemotherapy treatments under the guidance of Dr. Darnelle Catalan.  Return to see Dr. Derrell Lolling in 2 months.

## 2013-05-13 NOTE — Progress Notes (Signed)
Patient ID: Tabitha Ramirez, female   DOB: 1946/02/03, 67 y.o.   MRN: [id number]  Chief Complaint  Patient presents with  . Breast Cancer Long Term Follow Up    6wk f/u br ca during neoadjuvant    HPI Tabitha Ramirez is a 67 y.o. female.  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  On 03/10/2013 she underwent injection of blue dye, right axillary sentinel node biopsy and Port-A-Cath insertion. One out of 2 lymph nodes was positive for metastatic carcinoma. She has been receiving chemotherapy. She developed a profound anemia and had a syncopal episode and was hospitalized in transfused. She had upper endoscopy, colonoscopy, and CT angiogram of the chest which did not show any source of bleeding and no pulmonary embolus.   Mid treatment MRI was performed on 05/04/2013. This shows that the right breast mass has decreased from 3.4 cm to 2.5 cm. Small right pleural effusion small pericardial effusion were noted.  Review of cancer Center notes indicates that they plan to continue chemotherapy into the early part of December.  The patient is still hoping for a lumpectomy. I drew pictures of what that might look like, telling her that she might need a central lumpectomy with removal of the nipple and areola. This is based on the assumption of the tumor continues to respond. HPI  Past Medical History  Diagnosis Date  . Heart murmur   . Hypertension   . Anemia   . Allergy   . Arthritis   . Breast cancer   . Migraines     Past Surgical History  Procedure Laterality Date  . Abdominal hysterectomy      With bilateral salpingo-oophorectomy  . Portacath placement N/A 03/10/2013    Procedure: INSERTION PORT-A-CATH WITH FLUOROSCOPY AND ULTRASOUND;  Surgeon: Ernestene Mention, MD;  Location: [facility] OR;  Service: General;  Laterality: N/A;  . Axillary lymph node biopsy Right 03/10/2013   Procedure: AXILLARY LYMPH NODE BIOPSY;  Surgeon: Ernestene Mention, MD;  Location: [facility] OR;  Service: General;  Laterality: Right;  sentinel node with blue dye  . Esophagogastroduodenoscopy N/A 04/28/2013    Procedure: ESOPHAGOGASTRODUODENOSCOPY (EGD);  Surgeon: Graylin Shiver, MD;  Location: [facility];  Service: Endoscopy;  Laterality: N/A;  . Colonoscopy N/A 04/30/2013    Procedure: COLONOSCOPY;  Surgeon: Graylin Shiver, MD;  Location: [facility];  Service: Endoscopy;  Laterality: N/A;    Family History  Problem Relation Age of Onset  . Cancer Mother   . Congestive Heart Failure Mother   . Cancer Brother   . Diabetes Brother   . Heart disease Brother     AMI 05/22/2012  . Congestive Heart Failure Brother   . Cancer Maternal Grandmother     Social History History  Substance Use Topics  . Smoking status: Former Smoker    Types: Cigarettes  . Smokeless tobacco: Never Used     Comment: smoked in college 1 yr /occ wine  . Alcohol Use: No    Allergies  Allergen Reactions  . Penicillins Rash    Current Outpatient Prescriptions  Medication Sig Dispense Refill  . Acetaminophen (TYLENOL EXTRA STRENGTH PO) Take 1 tablet by mouth as needed.      . lidocaine-prilocaine (EMLA) cream Apply 1 application topically as needed (port access). Apply to port area 1-2 hours before chemotherapy; cover with plastioc wrap      .  LORazepam (ATIVAN) 0.5 MG tablet Take 1 tablet (0.5 mg total) by mouth at bedtime as needed for anxiety.  20 tablet  0  . Multiple Vitamin (MULTIVITAMIN WITH MINERALS) TABS Take 1 tablet by mouth daily.      Marland Kitchen nystatin cream (MYCOSTATIN) Apply topically 2 (two) times daily.      . ondansetron (ZOFRAN) 8 MG tablet Take 1 tablet (8 mg total) by mouth every 12 (twelve) hours as needed for nausea.  20 tablet  3  . pantoprazole (PROTONIX) 40 MG tablet Take 1 tablet (40 mg total) by mouth daily at 12 noon.  30 tablet  0  . prochlorperazine (COMPAZINE) 10 MG tablet Take 10 mg by mouth  every 6 (six) hours as needed (nausea/anxiety).      . triamterene-hydrochlorothiazide (MAXZIDE-25) 37.5-25 MG per tablet Take 1 tablet by mouth daily.  30 tablet  3  . dexamethasone (DECADRON) 4 MG tablet       . fluconazole (DIFLUCAN) 100 MG tablet Take 200 mg by mouth See admin instructions. 2 tabs by mouth x 1 day, then 1 tab by mouth daily       No current facility-administered medications for this visit.    Review of Systems Review of Systems  Constitutional: Negative for fever, chills and unexpected weight change.  HENT: Negative for hearing loss, congestion, sore throat, trouble swallowing and voice change.   Eyes: Negative for visual disturbance.  Respiratory: Negative for cough and wheezing.   Cardiovascular: Negative for chest pain, palpitations and leg swelling.  Gastrointestinal: Negative for nausea, vomiting, abdominal pain, diarrhea, constipation, blood in stool, abdominal distention and anal bleeding.  Genitourinary: Negative for hematuria, vaginal bleeding and difficulty urinating.  Musculoskeletal: Negative for arthralgias.  Skin: Negative for rash and wound.  Neurological: Positive for syncope. Negative for seizures and headaches.  Hematological: Negative for adenopathy. Does not bruise/bleed easily.  Psychiatric/Behavioral: Negative for confusion.    Blood pressure 122/64, pulse 92, resp. rate 16, height 5\' 4"  (1.626 m), weight 154 lb 9.6 oz (70.126 kg).  Physical Exam Physical Exam  Constitutional: She is oriented to person, place, and time. She appears well-developed and well-nourished. No distress.  HENT:  Head: Normocephalic and atraumatic.  Nose: Nose normal.  Mouth/Throat: No oropharyngeal exudate.  Eyes: Conjunctivae and EOM are normal. Pupils are equal, round, and reactive to light. Left eye exhibits no discharge. No scleral icterus.  Neck: Neck supple. No JVD present. No tracheal deviation present. No thyromegaly present.  Cardiovascular: Normal rate,  regular rhythm, normal heart sounds and intact distal pulses.   No murmur heard. Pulmonary/Chest: Effort normal and breath sounds normal. No respiratory distress. She has no wheezes. She has no rales. She exhibits no tenderness.    She still has a central breast mass in the retroareolar area and extending laterally a little bit . This feels like it is 3.5 cm by my exam. There's a tiny seroma in the right axilla.  Abdominal: Soft. Bowel sounds are normal.  Musculoskeletal: She exhibits no edema and no tenderness.  Lymphadenopathy:    She has no cervical adenopathy.  Neurological: She is alert and oriented to person, place, and time. She exhibits normal muscle tone. Coordination normal.  Skin: Skin is warm. No rash noted. She is not diaphoretic. No erythema. No pallor.  Psychiatric: She has a normal mood and affect. Her behavior is normal. Judgment and thought content normal.    Data Reviewed Cancer center notes. Recent MRI. Recent hospitalization.  Assessment  Locally advanced, triple negative invasive mammary carcinoma right breast, 12:00 central position, initially 4 cm, now apparently responding by MRI. Still quite noticeable on physical exam he  The patient desires breast conservation, if her tumor to be downstage by neoadjuvant chemotherapy  Status post sentinel lymph node biopsy Port-A-Cath insertion, uneventful recovery  Hypertension  Chemotherapy-induced anemia requiring transfusion  History TAH and BSO     Plan    Continue chemotherapy  Return to see me in 2 months  End of treatment MRI sometime in late fall or early winter.  Discuss and plan definitive breast surgery after end of treatment  MRI  Consider removal of Port-A-Cath at the time of definitive breast surgery, if okay with Dr. Darnelle Catalan.        Angelia Mould. Derrell Lolling, M.D., [facility] General and Minimally invasive Surgery Breast and Colorectal Surgery Office:    [phone] Pager:   [phone]  05/13/2013, 10:55 AM

## 2013-05-19 ENCOUNTER — Other Ambulatory Visit (HOSPITAL_BASED_OUTPATIENT_CLINIC_OR_DEPARTMENT_OTHER): Payer: Medicare Other | Admitting: Lab

## 2013-05-19 ENCOUNTER — Encounter: Payer: Self-pay | Admitting: Physician Assistant

## 2013-05-19 ENCOUNTER — Ambulatory Visit: Payer: Medicare Other

## 2013-05-19 ENCOUNTER — Telehealth: Payer: Self-pay | Admitting: Physician Assistant

## 2013-05-19 ENCOUNTER — Telehealth: Payer: Self-pay | Admitting: *Deleted

## 2013-05-19 ENCOUNTER — Ambulatory Visit (HOSPITAL_BASED_OUTPATIENT_CLINIC_OR_DEPARTMENT_OTHER): Payer: Medicare Other | Admitting: Physician Assistant

## 2013-05-19 ENCOUNTER — Encounter: Payer: Self-pay | Admitting: Oncology

## 2013-05-19 VITALS — BP 120/69 | HR 71 | Temp 98.3°F | Resp 20 | Ht 64.0 in | Wt 153.0 lb

## 2013-05-19 DIAGNOSIS — C50119 Malignant neoplasm of central portion of unspecified female breast: Secondary | ICD-10-CM

## 2013-05-19 DIAGNOSIS — Z171 Estrogen receptor negative status [ER-]: Secondary | ICD-10-CM

## 2013-05-19 DIAGNOSIS — D696 Thrombocytopenia, unspecified: Secondary | ICD-10-CM

## 2013-05-19 DIAGNOSIS — C50111 Malignant neoplasm of central portion of right female breast: Secondary | ICD-10-CM

## 2013-05-19 DIAGNOSIS — D649 Anemia, unspecified: Secondary | ICD-10-CM

## 2013-05-19 DIAGNOSIS — E876 Hypokalemia: Secondary | ICD-10-CM

## 2013-05-19 LAB — COMPREHENSIVE METABOLIC PANEL (CC13)
ALT: 27 U/L (ref 0–55)
BUN: 17.3 mg/dL (ref 7.0–26.0)
CO2: 27 mEq/L (ref 22–29)
Creatinine: 0.9 mg/dL (ref 0.6–1.1)
Total Bilirubin: 0.58 mg/dL (ref 0.20–1.20)

## 2013-05-19 LAB — CBC WITH DIFFERENTIAL/PLATELET
BASO%: 2.2 % — ABNORMAL HIGH (ref 0.0–2.0)
Basophils Absolute: 0.1 10*3/uL (ref 0.0–0.1)
HCT: 28.1 % — ABNORMAL LOW (ref 34.8–46.6)
LYMPH%: 13.9 % — ABNORMAL LOW (ref 14.0–49.7)
MCH: 33.3 pg (ref 25.1–34.0)
MCHC: 34.1 g/dL (ref 31.5–36.0)
MONO#: 0.2 10*3/uL (ref 0.1–0.9)
NEUT%: 74.8 % (ref 38.4–76.8)
Platelets: 97 10*3/uL — ABNORMAL LOW (ref 145–400)
WBC: 4.4 10*3/uL (ref 3.9–10.3)

## 2013-05-19 NOTE — Progress Notes (Signed)
ID: Tabitha Ramirez OB: Jun 19, 1946  MR#: [id number]  CSN#:[id number]  PCP: Vivi Barrack, MD GYN:  Lavina Hamman SU: Claud Kelp OTHER MD: Lurline Hare   HISTORY OF PRESENT ILLNESS: Tabitha Ramirez tells me she felt a mass in her right breast about 3 months ago but initially ignored it. She eventually brought it to her physician's attention and was set up for bilateral diagnostic mammography at the [facility] Center 01/31/2013. This showed an area of distortion in the upper outer quadrant measuring approximately 4.3 cm. Diffuse calcifications in both breasts were stable. The mass was palpable at 12:00, 3 cm from the nipple, and by ultrasound it was irregular, hypoechoic, and measured 2.5 cm. In addition there was prominent right axillary adenopathy the largest lymph node measuring 2.5 cm.  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 Biopsy of the lymph node was negative. It did show some lymph node tissue.  Bilateral breast MRI 02/10/2013 showed a ring-enhancing necrotic-appearing 3.4 cm mass in the anterior third of the right breast. There was also a 2.0 cm level one right axillary lymph node that was prominent, but maintains central fat. The left breast the left axilla were benign and there was no internal mammary adenopathy noted.  The patient's subsequent history is as detailed below.  INTERVAL HISTORY: Tabitha Ramirez returns today accompanied by her daughter Tabitha Ramirez for followup of her locally advanced right breast carcinoma. Today is day 8 cycle 4 of her planned 4 cycles of doxorubicin and cyclophosphamide. She receives Neulasta on day 2 for granulocyte support.  Tabitha Ramirez is feeling "okay" today, and is relieved to have completed all 4 cycles of doxorubicin/cyclophosphamide. With the exception of mild fatigue, she had no new complaints today.  REVIEW OF SYSTEMS: Tabitha Ramirez has had  no illnesses and denies fevers or chills. She's had no skin changes, abnormal bruising, or bleeding. Her appetite is good. She's had no problems at all with nausea and is having regular bowel movements. She's had no cough, phlegm production, shortness of breath, or chest pain. She denies any abnormal headaches or dizziness. She's had no additional episodes of syncope or loss of consciousness. She also denies any unusual myalgias, arthralgias, bony pain, or peripheral swelling. At baseline, she has no signs of peripheral neuropathy in either the upper or lower extremities.  A detailed review of systems is otherwise noncontributory.    PAST MEDICAL HISTORY: Past Medical History  Diagnosis Date  . Heart murmur   . Hypertension   . Anemia   . Allergy   . Arthritis   . Breast cancer   . Migraines     PAST SURGICAL HISTORY: Past Surgical History  Procedure Laterality Date  . Abdominal hysterectomy      With bilateral salpingo-oophorectomy  . Portacath placement N/A 03/10/2013    Procedure: INSERTION PORT-A-CATH WITH FLUOROSCOPY AND ULTRASOUND;  Surgeon: Ernestene Mention, MD;  Location: [facility] OR;  Service: General;  Laterality: N/A;  . Axillary lymph node biopsy Right 03/10/2013    Procedure: AXILLARY LYMPH NODE BIOPSY;  Surgeon: Ernestene Mention, MD;  Location: [facility] OR;  Service: General;  Laterality: Right;  sentinel node with blue dye  . Esophagogastroduodenoscopy N/A 04/28/2013    Procedure: ESOPHAGOGASTRODUODENOSCOPY (EGD);  Surgeon: Graylin Shiver, MD;  Location: [facility];  Service: Endoscopy;  Laterality: N/A;  . Colonoscopy N/A 04/30/2013    Procedure: COLONOSCOPY;  Surgeon: Karel Jarvis  Leafy Ro, MD;  Location: [facility];  Service: Endoscopy;  Laterality: N/A;    FAMILY HISTORY Family History  Problem Relation Age of Onset  . Cancer Mother   . Congestive Heart Failure Mother   . Cancer Brother   . Diabetes Brother   . Heart disease Brother     AMI 05/22/2012  . Congestive Heart Failure  Brother   . Cancer Maternal Grandmother    the patient's father died in an automobile accident at the age of 9. The patient's mother lived to be 69. Tabitha Ramirez has 3 brothers, no sisters. One brother had colon cancer at the age of 1. The patient's mother and the patient's mothers mother (maternal grandmother) both were diagnosed with some kind of cancer at the age of 77 or thereabouts, but Tabitha Ramirez does not know what type of cancer this may have been.  GYNECOLOGIC HISTORY:  Menarche age 36, first live birth age 89. The patient had her hysterectomy in the 1970s. She has been on estrogen replacement for she thinks 18 years.  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 She is divorced, lives by herself, with no pets. Her first child was given up for adoption. Her second child, Tabitha Ramirez, is a Geophysicist/field seismologist and is working towards a PhD at [employer]. Tiffony has no grandchildren. She attends new [employer]    ADVANCED DIRECTIVES: In place. The patient has named her daughter Tabitha Ramirez as her healthcare power of attorney. Tabitha Ramirez can be reached at [phone]. The healthcare power of attorney documents were brought by the patient 03/13/2013 and are separately scanned.   HEALTH MAINTENANCE: History  Substance Use Topics  . Smoking status: Former Smoker    Types: Cigarettes  . Smokeless tobacco: Never Used     Comment: smoked in college 1 yr /occ wine  . Alcohol Use: No     Colonoscopy: August 2012  PAP:  Bone density: Never  Lipid panel:  Allergies  Allergen Reactions  . Penicillins Rash    Current Outpatient Prescriptions  Medication Sig Dispense Refill  . Acetaminophen (TYLENOL EXTRA STRENGTH PO) Take 1 tablet by mouth as needed.      Marland Kitchen dexamethasone (DECADRON) 4 MG tablet       . fluconazole (DIFLUCAN) 100 MG tablet Take 200 mg by mouth See admin  instructions. 2 tabs by mouth x 1 day, then 1 tab by mouth daily      . lidocaine-prilocaine (EMLA) cream Apply 1 application topically as needed (port access). Apply to port area 1-2 hours before chemotherapy; cover with plastioc wrap      . LORazepam (ATIVAN) 0.5 MG tablet Take 1 tablet (0.5 mg total) by mouth at bedtime as needed for anxiety.  20 tablet  0  . Multiple Vitamin (MULTIVITAMIN WITH MINERALS) TABS Take 1 tablet by mouth daily.      Marland Kitchen nystatin cream (MYCOSTATIN) Apply topically 2 (two) times daily.      . ondansetron (ZOFRAN) 8 MG tablet Take 1 tablet (8 mg total) by mouth every 12 (twelve) hours as needed for nausea.  20 tablet  3  . pantoprazole (PROTONIX) 40 MG tablet Take 1 tablet (40 mg total) by mouth daily at 12 noon.  30 tablet  0  . prochlorperazine (COMPAZINE) 10 MG tablet Take 10 mg by mouth every 6 (six) hours as needed (nausea/anxiety).      Marland Kitchen  triamterene-hydrochlorothiazide (MAXZIDE-25) 37.5-25 MG per tablet Take 1 tablet by mouth daily.  30 tablet  3   No current facility-administered medications for this visit.    OBJECTIVE: Middle-aged [place] American woman in no acute distress Filed Vitals:   05/19/13 1158  BP: 120/69  Pulse: 71  Temp: 98.3 F (36.8 C)  Resp: 20     Body mass index is 26.25 kg/(m^2).    ECOG FS: 1 Filed Weights   05/19/13 1158  Weight: 153 lb (69.4 kg)   Sclerae unicteric Oropharynx is clear, no ulcerations or evidence of thrush No cervical or supraclavicular adenopathy Lungs clear to auscultation, no crackles or wheezes, good excursion bilaterally Heart regular rate and rhythm, systolic murmur noted Abdomen soft, nontender, positive bowel sounds MSK no focal spinal tenderness No peripheral edema Neuro: nonfocal, well oriented, positive affect Breasts: Deferred.   Port is intact in the left upper chest wall . No erythema, edema, or evidence of infection Skin: There is some hyperpigmentation on the hands and nailbeds bilaterally,  but no evidence of cracking or peeling.  No evidence of drainage or infection in the nailbeds.   LAB RESULTS:   Lab Results  Component Value Date   WBC 4.4 05/19/2013   NEUTROABS 3.3 05/19/2013   HGB 9.6* 05/19/2013   HCT 28.1* 05/19/2013   MCV 97.7 05/19/2013   PLT 97* 05/19/2013      Chemistry      Component Value Date/Time   NA 145 05/19/2013 1103   NA 141 04/30/2013 0530   K 3.5 05/19/2013 1103   K 3.2* 04/30/2013 0530   CL 106 04/30/2013 0530   CL 100 03/13/2013 0918   CO2 27 05/19/2013 1103   CO2 26 04/30/2013 0530   BUN 17.3 05/19/2013 1103   BUN 5* 04/30/2013 0530   CREATININE 0.9 05/19/2013 1103   CREATININE 0.75 04/30/2013 0530   CREATININE 0.94 11/29/2011 1535      Component Value Date/Time   CALCIUM 9.5 05/19/2013 1103   CALCIUM 8.7 04/30/2013 0530   ALKPHOS 115 05/19/2013 1103   ALKPHOS 96 02/28/2013 0937   AST 19 05/19/2013 1103   AST 27 02/28/2013 0937   ALT 27 05/19/2013 1103   ALT 27 02/28/2013 0937   BILITOT 0.58 05/19/2013 1103   BILITOT 0.3 02/28/2013 0937       STUDIES: Ct Angio Chest W/cm &/or Wo Cm  04/27/2013   *RADIOLOGY REPORT*  Clinical Data: Syncopal episode approximately 3 o'clock this morning the patient arose from bed.  Prior history of breast cancer.  Leukocytosis with white blood count of 47,800.  CT ANGIOGRAPHY CHEST  Technique:  Multidetector CT imaging of the chest using the standard protocol during bolus administration of intravenous contrast. Multiplanar reconstructed images including MIPs were obtained and reviewed to evaluate the vascular anatomy.  Contrast: OMNIPAQUE IOHEXOL 350 MG/ML SOLNV.  Comparison: None.  Findings: Contrast opacification of the pulmonary arteries is very good.  No filling defects within either main pulmonary artery or their branches in either lung to suggest pulmonary embolism.  Heart mildly enlarged with left ventricular predominance and mild left ventricular hypertrophy.  Moderate sized pericardial effusion. Moderate LAD coronary  atherosclerosis.  Moderate atherosclerosis involving the thoracic aorta without aneurysm or dissection. Proximal great vessels widely patent.  Left subclavian Port-A-Cath tip in the upper right atrium.  No significant mediastinal, hilar, or axillary lymphadenopathy. Approximate 2.5 x 2.3 cm fluid collection in the right axilla. Visualized thyroid gland unremarkable.  Mild emphysematous changes in  both lungs.  No confluent airspace consolidation.  No evidence of interstitial lung disease.  No pulmonary parenchymal nodules or masses.  Mild scarring in the lower lobes.  Central airways patent without significant bronchial wall thickening.  No pleural effusions.  Visualized upper abdomen unremarkable.  Anatomic variant in that the left lobe of the liver extends well across the midline into the left upper quadrant.  Bone window images demonstrate thoracic spondylosis and upper thoracic scoliosis convex left with compensatory lower thoracic scoliosis convex right.  IMPRESSION:  1.  No evidence of pulmonary embolism. 2.  Moderate-sized pericardial effusion. 3.  Mild cardiomegaly with left ventricular enlargement and left ventricular hypertrophy.  Moderate LAD coronary atherosclerosis. 4.  Mild COPD/emphysema.  No acute pulmonary disease. 5.  Approximate 2.5 cm fluid collection in the right axilla consistent with a post-operative seroma or resolving hematoma.   Original Report Authenticated By: Hulan Saas, M.D.   Mr Breast Bilateral W Wo Contrast  05/05/2013   *RADIOLOGY REPORT*  Clinical Data:  The patient is status post chemotherapy for right breast cancer. Right axillary sentinel lymph node biopsy 03/10/2013, with one node positive for carcinoma.  MRI OF THE BILATERAL BREASTS WITHOUT AND WITH CONTRAST  Technique: Multiplanar, multisequence MR images of the right breast were obtained prior to and following the intravenous administration of 14ml of Multihance.  Comparison:  Recent imaging examinations.  FINDINGS:   Breast composition:  c:  Heterogeneous fibroglandular tissue  Background parenchymal enhancement:  Moderate  Left breast:  No mass or abnormal enhancement.  Right breast:  There is a necrotic right breast mass at 12 o'clock which has decreased in size.  This mass measures 2.5 x 1.9 x 1.9 cm versus 3.4 x 2.6 x 2.7 cm. There is a fluid collection in the right breast measuring 2.5 x 2.5 x 1.6 cm.  There is associated smooth peripheral enhancement.  This is in the region of the previously biopsied right lymph node and may represent a postoperative seroma/hematoma.  Mild right breast skin thickening is noted.  Lymph nodes:  No abnormal appearing lymph nodes.  Ancillary findings:  Small right pleural effusion is noted.  There is a small amount of pericardial fluid.  IMPRESSION: 1. Interval decrease in size of right breast mass, measuring 2.5 x 1.9 x 1.9 cm versus 3.4 x 2.6 x 2.7 cm. 2.  Smooth peripherally enhancing fluid collection in the area of the previously biopsied right lymph node.  This most likely represents a postoperative collection. 3. Small right pleural effusion. 4. Small pericardial effusion.  RECOMMENDATION: Treatment planning.  BI-RADS CATEGORY 6:  Known biopsy-proven malignancy - appropriate action should be taken.  THREE-DIMENSIONAL MR IMAGE RENDERING ON INDEPENDENT WORKSTATION:  Three-dimensional MR images were rendered by post-processing of the original MR data on a DynaCad workstation.  The three-dimensional MR images were interpreted, and findings were reported in the accompanying complete MRI report for this study.   Original Report Authenticated By: Jerene Dilling, M.D.     ASSESSMENT: 67 y.o. [place] woman   (1)  status post right central breast biopsy 01/31/2013 for a clinical T2 NX, stage II invasive ductal carcinoma, grade 3, triple negative, with an MIB-1 of 84%  (2) biopsy of an enlarged (2.5 cm) right axillary lymph node 01/31/2013 was negative, felt possibly  discordant  (3) sentinel lymph node sampling 03/10/2013 showed one of two sentinel nodes to be positive; repeat prognostic profile pending  (4) neoadjuvant doxorubicin and cyclophosphamide being given in dose dense fashion with Neulasta  support starting 03/17/2013, to be followed by weekly carboplatin and paclitaxel x12, with first cycle on 05/27/2013.  (5)  status post hospitalization in early August 2014 secondary to a significant drop in hemoglobin leading to syncope and loss of consciousness.   PLAN: Fonda has done a great job completing her first 4 cycles of neoadjuvant chemotherapy, and is now ready to proceed with the next regimen, which will be 12 weekly doses of carboplatin/paclitaxel. She return next week on September 2 for labs, followup, and her first dose of carbo/paclitaxel. We will continue to follow her closely with weekly visits, and all of these are being scheduled accordingly. We'll also follow her labs closely, and will support her with transfusions as necessary if her hemoglobin drops below 9.0.   Linnet will continue with her same antinausea regimen which will include prochlorperazine and ondansetron. She has both of those medications on hand at home and voiced understanding of how to utilize them appropriately. We again reviewed possible side effects and toxicities associated with the next regimen of chemotherapy.  Tabitha Ramirez and Tabitha Ramirez both voice understanding and agreement with our plan today, and know to call with any changes or problems prior to the next appointment.  Zollie Scale, PA-C   05/19/2013 12:36 PM

## 2013-05-19 NOTE — Telephone Encounter (Signed)
Per staff message and POF I have scheduled appts.  JMW  

## 2013-05-21 NOTE — Discharge Summary (Signed)
Physician Discharge Summary  Tabitha Ramirez ZOX:[id number] DOB: [birth year] DOA: 04/27/2013  PCP: Vivi Barrack, MD  Admit date: 04/27/2013 Discharge date: 04/30/2013  Time spent: 45 minutes  Recommendations for Outpatient Follow-up:  1. Dr.Magrinat in 1 week 2. CBC in 1 week  Discharge Diagnoses:  Principal Problem:   Syncope and collapse Active Problems:   Essential hypertension, benign   Anemia   Cancer of central portion of female breast   Leukocytosis   Discharge Condition: stable  Diet recommendation:regular  Filed Weights   04/27/13 1020 04/27/13 1022  Weight: 68.947 kg (152 lb) 68.9 kg (151 lb 14.4 oz)    History of present illness:  Pleasant 67 y/o AAW with PMH significant for breast cancer currently undergoing chemotherapy and HTN. She went to the bathroom this am and after getting up from the toilet suddenly felt ferry dizzy and passed out. Her daughter states she heard her fall and found her on the floor in the bathroom and had to carry her to bed. She did not hurt herself during the fall. She woke up after about 1 min. No bowel/bladder incontinence, no slurred speech. She was brought to the ED where her Hb was 5.6, she was FOBT +, and a CT scan (done to r/o PE shows a moderate-sized pericardial effusion. On further questioning she admits to being fatigued over the past 2 days, but denies CP/SOB. We have been asked to admit her for further evaluation and management.   Hospital Course:  Syncope  -Suspect related to her anemia  -no events on tele   Anemia  -Suspect acute blood loss with heme positive rectal exam on top of chronic disease related to breast cancer and ongoing chemotherapy.  -Hb was 8.7 3 days prior with positive FOBT.  -transfused 2 units of PRBC on admission/ 8/3.  -s/p EGD 8/4 which was normal  -s/p colonoscopy 8/6 small polyp otherwise normal, no overt bleeding noted and no cause for GI bleeding found. Hb stable post transfusion x2 days   Suspected  Pericardial Effusion  -Noted incidentally on CT scan to r/o PE.  -2D echo normal with no pericardial effusion and grade 2 diastolic dysfunction   Leukocytosis  -Likely 2/2 neulasta received after last week.   Breast Cancer  -hold chemo acutely  -Dr. Darnelle Catalan notified of admission      Procedures: EGD: FINDINGS: 8/4 Normal esophagus, stomach and duodenum  There is nothing on this exam to explain anemia or heme positive  stool   Colonoscopy 8/6 Single 3mm polyp, removed, otherwise normal colonoscopy  Consultations:  Enid Baas Dr.Ganem  Discharge Exam: Filed Vitals:   04/30/13 1422  BP: 119/53  Pulse: 79  Temp: 98.5 F (36.9 C)  Resp: 20    General: AAOx3 Cardiovascular: S1S2/RRR Respiratory: CTAB  Discharge Instructions  Discharge Orders   Future Appointments Provider Department Dept Phone   05/27/2013 11:15 AM Beverely Pace [facility] [phone]   05/27/2013 11:45 AM Amy Allegra Grana, PA-C [place] [facility] [phone]   05/27/2013 12:45 PM [facility] [place] [facility] [phone]   06/02/2013 10:15 AM Windell Hummingbird [facility] [phone]   06/02/2013 10:45 AM Amy Allegra Grana, PA-C [place] [facility] [phone]   06/02/2013 11:45 AM [facility] [place] [facility] [phone]   06/09/2013 11:15 AM Delcie Roch [facility] [phone]   06/09/2013 11:45 AM  Joanna Puff [facility] [phone]   06/16/2013 11:15 AM Windell Hummingbird [facility] MEDICAL ONCOLOGY [phone]   06/16/2013 11:45 AM Amy Wenda [facility] [phone]   06/23/2013 10:30 AM Delcie Roch [facility] [phone]   06/23/2013 11:00 AM Keitha Butte, NP [facility] MEDICAL ONCOLOGY [phone]   06/30/2013 12:45 PM Delcie Roch [facility]COL[facility] [phone]   06/30/2013 1:15 PM Amy Wenda [facility] MED[facility] ONCOL[facility] [phone]   07/11/2013 2:30 PM Ernestene Mention, MD [facility], [place] [phone]   Future Orders Complete By Expires   Diet - low sodium heart healthy  As directed    Increase activity slowly  As directed        Medication List    STOP taking these medications       dexamethasone 4 MG tablet  Commonly known as:  DECADRON     naproxen sodium 220 MG tablet  Commonly known as:  ANAPROX      TAKE these medications       fluconazole 100 MG tablet  Commonly known as:  DIFLUCAN  Take 200 mg by mouth See admin instructions. 2 tabs by mouth x 1 day, then 1 tab by mouth daily     lidocaine-prilocaine cream  Commonly known as:  EMLA  Apply 1 application topically as needed (port access). Apply to port area 1-2 hours before chemotherapy; cover with plastioc wrap     LORazepam 0.5 MG tablet  Commonly known as:  ATIVAN  Take 1 tablet (0.5 mg total) by mouth at bedtime as needed for anxiety.     multivitamin with minerals Tabs tablet  Take 1 tablet by mouth daily.     pantoprazole 40 MG tablet  Commonly known as:  PROTONIX  Take 1 tablet (40 mg total) by mouth daily at 12 noon.     prochlorperazine 10 MG tablet  Commonly known as:  COMPAZINE  Take 10 mg by mouth every 6 (six) hours as needed (nausea/anxiety).     triamterene-hydrochlorothiazide 37.5-25 MG per tablet  Commonly known as:  MAXZIDE-25  Take 1 tablet by mouth daily.       Allergies  Allergen Reactions  . Penicillins Rash       Follow-up Information   Follow up with Lowella Dell, MD On 05/05/2013.   Specialty:  Oncology   Contact information:   [address] [place] [address] [phone]        The results of significant diagnostics from this hospitalization  (including imaging, microbiology, ancillary and laboratory) are listed below for reference.    Significant Diagnostic Studies: Ct Angio Chest W/cm &/or Wo Cm  04/27/2013   *RADIOLOGY REPORT*  Clinical Data: Syncopal episode approximately 3 o'clock this morning the patient arose from bed.  Prior history of breast cancer.  Leukocytosis with white blood count of 47,800.  CT ANGIOGRAPHY CHEST  Technique:  Multidetector CT imaging of the chest using the standard protocol during bolus administration of intravenous contrast. Multiplanar reconstructed images including MIPs were obtained and reviewed to evaluate the vascular anatomy.  Contrast: OMNIPAQUE IOHEXOL 350 MG/ML SOLNV.  Comparison: None.  Findings: Contrast opacification of the pulmonary arteries is very good.  No filling defects within either main pulmonary artery or their branches in either lung to suggest pulmonary embolism.  Heart mildly  enlarged with left ventricular predominance and mild left ventricular hypertrophy.  Moderate sized pericardial effusion. Moderate LAD coronary atherosclerosis.  Moderate atherosclerosis involving the thoracic aorta without aneurysm or dissection. Proximal great vessels widely patent.  Left subclavian Port-A-Cath tip in the upper right atrium.  No significant mediastinal, hilar, or axillary lymphadenopathy. Approximate 2.5 x 2.3 cm fluid collection in the right axilla. Visualized thyroid gland unremarkable.  Mild emphysematous changes in both lungs.  No confluent airspace consolidation.  No evidence of interstitial lung disease.  No pulmonary parenchymal nodules or masses.  Mild scarring in the lower lobes.  Central airways patent without significant bronchial wall thickening.  No pleural effusions.  Visualized upper abdomen unremarkable.  Anatomic variant in that the left lobe of the liver extends well across the midline into the left upper quadrant.  Bone window images demonstrate thoracic spondylosis and upper  thoracic scoliosis convex left with compensatory lower thoracic scoliosis convex right.  IMPRESSION:  1.  No evidence of pulmonary embolism. 2.  Moderate-sized pericardial effusion. 3.  Mild cardiomegaly with left ventricular enlargement and left ventricular hypertrophy.  Moderate LAD coronary atherosclerosis. 4.  Mild COPD/emphysema.  No acute pulmonary disease. 5.  Approximate 2.5 cm fluid collection in the right axilla consistent with a post-operative seroma or resolving hematoma.   Original Report Authenticated By: Hulan Saas, M.D.   Mr Breast Bilateral W Wo Contrast  05/05/2013   *RADIOLOGY REPORT*  Clinical Data:  The patient is status post chemotherapy for right breast cancer. Right axillary sentinel lymph node biopsy 03/10/2013, with one node positive for carcinoma.  MRI OF THE BILATERAL BREASTS WITHOUT AND WITH CONTRAST  Technique: Multiplanar, multisequence MR images of the right breast were obtained prior to and following the intravenous administration of 14ml of Multihance.  Comparison:  Recent imaging examinations.  FINDINGS:  Breast composition:  c:  Heterogeneous fibroglandular tissue  Background parenchymal enhancement:  Moderate  Left breast:  No mass or abnormal enhancement.  Right breast:  There is a necrotic right breast mass at 12 o'clock which has decreased in size.  This mass measures 2.5 x 1.9 x 1.9 cm versus 3.4 x 2.6 x 2.7 cm. There is a fluid collection in the right breast measuring 2.5 x 2.5 x 1.6 cm.  There is associated smooth peripheral enhancement.  This is in the region of the previously biopsied right lymph node and may represent a postoperative seroma/hematoma.  Mild right breast skin thickening is noted.  Lymph nodes:  No abnormal appearing lymph nodes.  Ancillary findings:  Small right pleural effusion is noted.  There is a small amount of pericardial fluid.  IMPRESSION: 1. Interval decrease in size of right breast mass, measuring 2.5 x 1.9 x 1.9 cm versus 3.4 x 2.6 x 2.7  cm. 2.  Smooth peripherally enhancing fluid collection in the area of the previously biopsied right lymph node.  This most likely represents a postoperative collection. 3. Small right pleural effusion. 4. Small pericardial effusion.  RECOMMENDATION: Treatment planning.  BI-RADS CATEGORY 6:  Known biopsy-proven malignancy - appropriate action should be taken.  THREE-DIMENSIONAL MR IMAGE RENDERING ON INDEPENDENT WORKSTATION:  Three-dimensional MR images were rendered by post-processing of the original MR data on a DynaCad workstation.  The three-dimensional MR images were interpreted, and findings were reported in the accompanying complete MRI report for this study.   Original Report Authenticated By: Jerene Dilling, M.D.    Microbiology: No results found for this or any previous visit (from the past 240 hour(s)).  Labs: Basic Metabolic Panel:  Recent Labs Lab 05/19/13 1103  NA 145  K 3.5  CO2 27  GLUCOSE 143*  BUN 17.3  CREATININE 0.9  CALCIUM 9.5   Liver Function Tests:  Recent Labs Lab 05/19/13 1103  AST 19  ALT 27  ALKPHOS 115  BILITOT 0.58  PROT 7.1  ALBUMIN 3.4*   No results found for this basename: LIPASE, AMYLASE,  in the last 168 hours No results found for this basename: AMMONIA,  in the last 168 hours CBC:  Recent Labs Lab 05/19/13 1103  WBC 4.4  NEUTROABS 3.3  HGB 9.6*  HCT 28.1*  MCV 97.7  PLT 97*   Cardiac Enzymes: No results found for this basename: CKTOTAL, CKMB, CKMBINDEX, TROPONINI,  in the last 168 hours BNP: BNP (last 3 results) No results found for this basename: PROBNP,  in the last 8760 hours CBG: No results found for this basename: GLUCAP,  in the last 168 hours     Signed:  Arnett Duddy  [facility] 05/21/2013, 2:16 PM

## 2013-05-22 ENCOUNTER — Encounter: Payer: Self-pay | Admitting: Oncology

## 2013-05-27 ENCOUNTER — Telehealth: Payer: Self-pay | Admitting: Oncology

## 2013-05-27 ENCOUNTER — Other Ambulatory Visit (HOSPITAL_BASED_OUTPATIENT_CLINIC_OR_DEPARTMENT_OTHER): Payer: Medicare Other | Admitting: Lab

## 2013-05-27 ENCOUNTER — Other Ambulatory Visit: Payer: Self-pay | Admitting: *Deleted

## 2013-05-27 ENCOUNTER — Ambulatory Visit (HOSPITAL_BASED_OUTPATIENT_CLINIC_OR_DEPARTMENT_OTHER): Payer: Medicare Other | Admitting: Physician Assistant

## 2013-05-27 ENCOUNTER — Telehealth: Payer: Self-pay | Admitting: *Deleted

## 2013-05-27 ENCOUNTER — Ambulatory Visit (HOSPITAL_BASED_OUTPATIENT_CLINIC_OR_DEPARTMENT_OTHER): Payer: Medicare Other

## 2013-05-27 ENCOUNTER — Encounter: Payer: Self-pay | Admitting: Physician Assistant

## 2013-05-27 VITALS — BP 160/84 | HR 102 | Temp 97.5°F | Resp 20

## 2013-05-27 VITALS — BP 127/76 | HR 18 | Temp 98.5°F | Ht 64.0 in | Wt 154.8 lb

## 2013-05-27 DIAGNOSIS — D649 Anemia, unspecified: Secondary | ICD-10-CM

## 2013-05-27 DIAGNOSIS — C50919 Malignant neoplasm of unspecified site of unspecified female breast: Secondary | ICD-10-CM

## 2013-05-27 DIAGNOSIS — C50111 Malignant neoplasm of central portion of right female breast: Secondary | ICD-10-CM

## 2013-05-27 DIAGNOSIS — Z171 Estrogen receptor negative status [ER-]: Secondary | ICD-10-CM

## 2013-05-27 DIAGNOSIS — E876 Hypokalemia: Secondary | ICD-10-CM

## 2013-05-27 DIAGNOSIS — Z5111 Encounter for antineoplastic chemotherapy: Secondary | ICD-10-CM

## 2013-05-27 LAB — CBC WITH DIFFERENTIAL/PLATELET
Basophils Absolute: 0.1 10*3/uL (ref 0.0–0.1)
Eosinophils Absolute: 0.2 10*3/uL (ref 0.0–0.5)
HCT: 27 % — ABNORMAL LOW (ref 34.8–46.6)
HGB: 9.1 g/dL — ABNORMAL LOW (ref 11.6–15.9)
LYMPH%: 8.9 % — ABNORMAL LOW (ref 14.0–49.7)
MCHC: 33.7 g/dL (ref 31.5–36.0)
MONO#: 1.2 10*3/uL — ABNORMAL HIGH (ref 0.1–0.9)
NEUT#: 16.6 10*3/uL — ABNORMAL HIGH (ref 1.5–6.5)
NEUT%: 83.3 % — ABNORMAL HIGH (ref 38.4–76.8)
Platelets: 186 10*3/uL (ref 145–400)
WBC: 19.9 10*3/uL — ABNORMAL HIGH (ref 3.9–10.3)
lymph#: 1.8 10*3/uL (ref 0.9–3.3)

## 2013-05-27 MED ORDER — HEPARIN SOD (PORK) LOCK FLUSH 100 UNIT/ML IV SOLN
500.0000 [IU] | Freq: Once | INTRAVENOUS | Status: AC | PRN
  Administered 2013-05-27: 500 [IU]
  Filled 2013-05-27: qty 5

## 2013-05-27 MED ORDER — DIPHENHYDRAMINE HCL 50 MG/ML IJ SOLN
50.0000 mg | Freq: Once | INTRAMUSCULAR | Status: AC
  Administered 2013-05-27: 50 mg via INTRAVENOUS

## 2013-05-27 MED ORDER — SODIUM CHLORIDE 0.9 % IV SOLN
Freq: Once | INTRAVENOUS | Status: AC
  Administered 2013-05-27: 13:00:00 via INTRAVENOUS

## 2013-05-27 MED ORDER — ONDANSETRON 16 MG/50ML IVPB (CHCC)
16.0000 mg | Freq: Once | INTRAVENOUS | Status: AC
  Administered 2013-05-27: 16 mg via INTRAVENOUS

## 2013-05-27 MED ORDER — PACLITAXEL CHEMO INJECTION 300 MG/50ML
80.0000 mg/m2 | Freq: Once | INTRAVENOUS | Status: AC
  Administered 2013-05-27: 138 mg via INTRAVENOUS
  Filled 2013-05-27: qty 23

## 2013-05-27 MED ORDER — DEXAMETHASONE SODIUM PHOSPHATE 20 MG/5ML IJ SOLN
20.0000 mg | Freq: Once | INTRAMUSCULAR | Status: AC
Start: 2013-05-27 — End: 2013-05-27
  Administered 2013-05-27: 20 mg via INTRAVENOUS

## 2013-05-27 MED ORDER — SODIUM CHLORIDE 0.9 % IV SOLN
200.0000 mg | Freq: Once | INTRAVENOUS | Status: AC
  Administered 2013-05-27: 200 mg via INTRAVENOUS
  Filled 2013-05-27: qty 20

## 2013-05-27 MED ORDER — FAMOTIDINE IN NACL 20-0.9 MG/50ML-% IV SOLN
20.0000 mg | Freq: Once | INTRAVENOUS | Status: AC
  Administered 2013-05-27: 20 mg via INTRAVENOUS

## 2013-05-27 MED ORDER — SODIUM CHLORIDE 0.9 % IJ SOLN
10.0000 mL | INTRAMUSCULAR | Status: DC | PRN
  Administered 2013-05-27: 10 mL
  Filled 2013-05-27: qty 10

## 2013-05-27 NOTE — Patient Instructions (Addendum)
[facility] Discharge Instructions for Patients Receiving Chemotherapy  Today you received the following chemotherapy agents :  Taxol, Carboplatin.  To help prevent nausea and vomiting after your treatment, we encourage you to take your nausea medication as instructed by your physician.   If you develop nausea and vomiting that is not controlled by your nausea medication, call the clinic.   BELOW ARE SYMPTOMS THAT SHOULD BE REPORTED IMMEDIATELY:  *FEVER GREATER THAN 100.5 F  *CHILLS WITH OR WITHOUT FEVER  NAUSEA AND VOMITING THAT IS NOT CONTROLLED WITH YOUR NAUSEA MEDICATION  *UNUSUAL SHORTNESS OF BREATH  *UNUSUAL BRUISING OR BLEEDING  TENDERNESS IN MOUTH AND THROAT WITH OR WITHOUT PRESENCE OF ULCERS  *URINARY PROBLEMS  *BOWEL PROBLEMS  UNUSUAL RASH Items with * indicate a potential emergency and should be followed up as soon as possible.  Feel free to call the clinic you have any questions or concerns. The clinic phone number is [phone].    

## 2013-05-27 NOTE — Telephone Encounter (Signed)
Per staff message and POF I have scheduled appts.  JMW  

## 2013-05-27 NOTE — Progress Notes (Signed)
ID: Tabitha Ramirez OB: August 09, 1946  MR#: [id number]  CSN#:[id number]  PCP: Vivi Barrack, MD GYN:  Lavina Hamman SU: Claud Kelp OTHER MD: Lurline Hare   HISTORY OF PRESENT ILLNESS: Tabitha Ramirez tells me she felt a mass in her right breast about 3 months ago but initially ignored it. She eventually brought it to her physician's attention and was set up for bilateral diagnostic mammography at the [facility] Center 01/31/2013. This showed an area of distortion in the upper outer quadrant measuring approximately 4.3 cm. Diffuse calcifications in both breasts were stable. The mass was palpable at 12:00, 3 cm from the nipple, and by ultrasound it was irregular, hypoechoic, and measured 2.5 cm. In addition there was prominent right axillary adenopathy the largest lymph node measuring 2.5 cm.  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 Biopsy of the lymph node was negative. It did show some lymph node tissue.  Bilateral breast MRI 02/10/2013 showed a ring-enhancing necrotic-appearing 3.4 cm mass in the anterior third of the right breast. There was also a 2.0 cm level one right axillary lymph node that was prominent, but maintains central fat. The left breast the left axilla were benign and there was no internal mammary adenopathy noted.  The patient's subsequent history is as detailed below.  INTERVAL HISTORY: Tabitha Ramirez returns today accompanied by her daughter Tabitha Ramirez for followup of her locally advanced right breast carcinoma. She has completed all 4 cycles of dose dense doxorubicin/cyclophosphamide and is due for her first of 12 planned weekly doses of carbo/paclitaxel today.   Tabitha Ramirez is feeling well today, with no new complaints. Her energy level is good, as is her appetite. At baseline, she has no signs of peripheral neuropathy in either the upper or lower  extremities.   REVIEW OF SYSTEMS: Tabitha Ramirez has had no illnesses and denies fevers or chills. She's had no rashes, abnormal bruising, or bleeding. Her nail beds are dark, but she has had no nail bed tenderness and denies any drainage or evidence of infection. She's had no problems at all with nausea and is having regular bowel movements. She's had no cough, phlegm production, shortness of breath, or chest pain. She denies any abnormal headaches or dizziness.  She occasionally has brief episodes of pain in the right breast, but otherwise denies any unusual myalgias, arthralgias, bony pain, or peripheral swelling.  A detailed review of systems is otherwise noncontributory.    PAST MEDICAL HISTORY: Past Medical History  Diagnosis Date  . Heart murmur   . Hypertension   . Anemia   . Allergy   . Arthritis   . Breast cancer   . Migraines     PAST SURGICAL HISTORY: Past Surgical History  Procedure Laterality Date  . Abdominal hysterectomy      With bilateral salpingo-oophorectomy  . Portacath placement N/A 03/10/2013    Procedure: INSERTION PORT-A-CATH WITH FLUOROSCOPY AND ULTRASOUND;  Surgeon: Ernestene Mention, MD;  Location: [facility] OR;  Service: General;  Laterality: N/A;  . Axillary lymph node biopsy Right 03/10/2013    Procedure: AXILLARY LYMPH NODE BIOPSY;  Surgeon: Ernestene Mention, MD;  Location: [facility] OR;  Service: General;  Laterality: Right;  sentinel node with blue dye  . Esophagogastroduodenoscopy N/A 04/28/2013    Procedure: ESOPHAGOGASTRODUODENOSCOPY (EGD);  Surgeon: Graylin Shiver, MD;  Location: [facility];  Service: Endoscopy;  Laterality: N/A;  . Colonoscopy N/A  04/30/2013    Procedure: COLONOSCOPY;  Surgeon: Graylin Shiver, MD;  Location: [facility] END[facility]COPY;  Service: Endoscopy;  Laterality: N/A;    FAMILY HISTORY Family History  Problem Relation Age of Onset  . Cancer Mother   . Congestive Heart Failure Mother   . Cancer Brother   . Diabetes Brother   . Heart disease Brother      AMI 05/22/2012  . Congestive Heart Failure Brother   . Cancer Maternal Grandmother    the patient's father died in an automobile accident at the age of 58. The patient's mother lived to be 34. Tabitha Ramirez has 3 brothers, no sisters. One brother had colon cancer at the age of 49. The patient's mother and the patient's mothers mother (maternal grandmother) both were diagnosed with some kind of cancer at the age of 36 or thereabouts, but Avantika does not know what type of cancer this may have been.  GYNECOLOGIC HISTORY:  Menarche age 25, first live birth age 70. The patient had her hysterectomy in the 1970s. She has been on estrogen replacement for she thinks 18 years.  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 She is divorced, lives by herself, with no pets. Her first child was given up for adoption. Her second child, Tabitha Ramirez, is a Geophysicist/field seismologist and is working towards a PhD at [employer]. Tabitha Ramirez has no grandchildren. She attends new [employer]    ADVANCED DIRECTIVES: In place. The patient has named her daughter Tabitha Ramirez as her healthcare power of attorney. Tabitha Ramirez can be reached at [phone]. The healthcare power of attorney documents were brought by the patient 03/13/2013 and are separately scanned.   HEALTH MAINTENANCE: History  Substance Use Topics  . Smoking status: Former Smoker    Types: Cigarettes  . Smokeless tobacco: Never Used     Comment: smoked in college 1 yr /occ wine  . Alcohol Use: No     Colonoscopy: August 2012  PAP:  Bone density: Never  Lipid panel:  Allergies  Allergen Reactions  . Penicillins Rash    Current Outpatient Prescriptions  Medication Sig Dispense Refill  . Acetaminophen (TYLENOL EXTRA STRENGTH PO) Take 1 tablet by mouth as needed.      Marland Kitchen dexamethasone (DECADRON) 4 MG tablet       . fluconazole (DIFLUCAN) 100 MG  tablet Take 200 mg by mouth See admin instructions. 2 tabs by mouth x 1 day, then 1 tab by mouth daily      . lidocaine-prilocaine (EMLA) cream Apply 1 application topically as needed (port access). Apply to port area 1-2 hours before chemotherapy; cover with plastioc wrap      . LORazepam (ATIVAN) 0.5 MG tablet Take 1 tablet (0.5 mg total) by mouth at bedtime as needed for anxiety.  20 tablet  0  . Multiple Vitamin (MULTIVITAMIN WITH MINERALS) TABS Take 1 tablet by mouth daily.      Marland Kitchen nystatin cream (MYCOSTATIN) Apply topically 2 (two) times daily.      . ondansetron (ZOFRAN) 8 MG tablet Take 1 tablet (8 mg total) by mouth every 12 (twelve) hours as needed for nausea.  20 tablet  3  . pantoprazole (PROTONIX) 40 MG tablet Take 1 tablet (40 mg total) by mouth daily at 12 noon.  30 tablet  0  . prochlorperazine (COMPAZINE) 10 MG tablet Take 10 mg by mouth every 6 (six)  hours as needed (nausea/anxiety).      . triamterene-hydrochlorothiazide (MAXZIDE-25) 37.5-25 MG per tablet Take 1 tablet by mouth daily.  30 tablet  3   No current facility-administered medications for this visit.    OBJECTIVE: Middle-aged [place] American woman in no acute distress Filed Vitals:   05/27/13 1132  BP: 127/76  Pulse: 18  Temp: 98.5 F (36.9 C)     Body mass index is 26.56 kg/(m^2).    ECOG FS: 1 Filed Weights   05/27/13 1132  Weight: 154 lb 12.8 oz (70.217 kg)   Sclerae unicteric Oropharynx is clear, no ulcerations or evidence of thrush No cervical or supraclavicular adenopathy Lungs clear to auscultation, no crackles or wheezes, good excursion bilaterally Heart regular rate and rhythm, systolic murmur appreciated Abdomen soft, nontender, positive bowel sounds MSK no focal spinal tenderness No peripheral edema Neuro: nonfocal, well oriented, positive affect Breasts: Palpable mass in the central portion of the right breast, approximately 2.5 cm in diameter. Palpable right axillary lymph seroma measuring  approximately 1.0 cm. Left breast is unremarkable. Left axilla is benign.   Port is intact in the left upper chest wall . No erythema, edema, or evidence of infection Skin: There is some hyperpigmentation on the hands and nailbeds bilaterally, but no evidence of cracking or peeling.  No evidence of drainage or infection in the nailbeds.   LAB RESULTS:   Lab Results  Component Value Date   WBC 19.9* 05/27/2013   NEUTROABS 16.6* 05/27/2013   HGB 9.1* 05/27/2013   HCT 27.0* 05/27/2013   MCV 96.8 05/27/2013   PLT 186 05/27/2013      Chemistry      Component Value Date/Time   NA 145 05/19/2013 1103   NA 141 04/30/2013 0530   K 3.5 05/19/2013 1103   K 3.2* 04/30/2013 0530   CL 106 04/30/2013 0530   CL 100 03/13/2013 0918   CO2 27 05/19/2013 1103   CO2 26 04/30/2013 0530   BUN 17.3 05/19/2013 1103   BUN 5* 04/30/2013 0530   CREATININE 0.9 05/19/2013 1103   CREATININE 0.75 04/30/2013 0530   CREATININE 0.94 11/29/2011 1535      Component Value Date/Time   CALCIUM 9.5 05/19/2013 1103   CALCIUM 8.7 04/30/2013 0530   ALKPHOS 115 05/19/2013 1103   ALKPHOS 96 02/28/2013 0937   AST 19 05/19/2013 1103   AST 27 02/28/2013 0937   ALT 27 05/19/2013 1103   ALT 27 02/28/2013 0937   BILITOT 0.58 05/19/2013 1103   BILITOT 0.3 02/28/2013 4540       STUDIES: Mr Breast Bilateral W Wo Contrast  05/05/2013   *RADIOLOGY REPORT*  Clinical Data:  The patient is status post chemotherapy for right breast cancer. Right axillary sentinel lymph node biopsy 03/10/2013, with one node positive for carcinoma.  MRI OF THE BILATERAL BREASTS WITHOUT AND WITH CONTRAST  Technique: Multiplanar, multisequence MR images of the right breast were obtained prior to and following the intravenous administration of 14ml of Multihance.  Comparison:  Recent imaging examinations.  FINDINGS:  Breast composition:  c:  Heterogeneous fibroglandular tissue  Background parenchymal enhancement:  Moderate  Left breast:  No mass or abnormal enhancement.  Right breast:  There  is a necrotic right breast mass at 12 o'clock which has decreased in size.  This mass measures 2.5 x 1.9 x 1.9 cm versus 3.4 x 2.6 x 2.7 cm. There is a fluid collection in the right breast measuring 2.5 x 2.5 x 1.6 cm.  There is associated smooth peripheral enhancement.  This is in the region of the previously biopsied right lymph node and may represent a postoperative seroma/hematoma.  Mild right breast skin thickening is noted.  Lymph nodes:  No abnormal appearing lymph nodes.  Ancillary findings:  Small right pleural effusion is noted.  There is a small amount of pericardial fluid.  IMPRESSION: 1. Interval decrease in size of right breast mass, measuring 2.5 x 1.9 x 1.9 cm versus 3.4 x 2.6 x 2.7 cm. 2.  Smooth peripherally enhancing fluid collection in the area of the previously biopsied right lymph node.  This most likely represents a postoperative collection. 3. Small right pleural effusion. 4. Small pericardial effusion.  RECOMMENDATION: Treatment planning.  BI-RADS CATEGORY 6:  Known biopsy-proven malignancy - appropriate action should be taken.  THREE-DIMENSIONAL MR IMAGE RENDERING ON INDEPENDENT WORKSTATION:  Three-dimensional MR images were rendered by post-processing of the original MR data on a DynaCad workstation.  The three-dimensional MR images were interpreted, and findings were reported in the accompanying complete MRI report for this study.   Original Report Authenticated By: Jerene Dilling, M.D.     ASSESSMENT: 67 y.o. [place] woman   (1)  status post right central breast biopsy 01/31/2013 for a clinical T2 NX, stage II invasive ductal carcinoma, grade 3, triple negative, with an MIB-1 of 84%  (2) biopsy of an enlarged (2.5 cm) right axillary lymph node 01/31/2013 was negative, felt possibly discordant  (3) sentinel lymph node sampling 03/10/2013 showed one of two sentinel nodes to be positive; repeat prognostic profile pending  (4) neoadjuvant doxorubicin and cyclophosphamide  being given in dose dense fashion with Neulasta support starting 03/17/2013, to be followed by weekly carboplatin and paclitaxel x12, with first cycle on 05/27/2013.  (5)  status post hospitalization in early August 2014 secondary to a significant drop in hemoglobin leading to syncope and loss of consciousness.   PLAN: Jacquelinne will proceed to treatment today as scheduled for her first of 12 planned weekly doses of carboplatin/paclitaxel in the neoadjuvant setting. We again reviewed possible side effects and toxicities associated with these drugs. We reviewed her anti-nausea regimen which will again include ondansetron and prochlorperazine. She was given all this information in writing today, and both within the and her daughter Tabitha Ramirez voices understanding of how to utilize these medications appropriately.  We will continue to follow Tabitha Ramirez closely on weekly basis, and I'm already scheduled to see her next week on September 8 for her second weekly cycle. We'll continue to follow her hemoglobin closely, and we'll proceed with transfusions as necessary if her hemoglobin drops below 9.0.  Tabitha Ramirez voices understanding and agreement with this plan, and will call with any changes or problems.   Zollie Scale, PA-C   05/27/2013 12:43 PM

## 2013-06-02 ENCOUNTER — Ambulatory Visit (HOSPITAL_BASED_OUTPATIENT_CLINIC_OR_DEPARTMENT_OTHER): Payer: Medicare Other

## 2013-06-02 ENCOUNTER — Other Ambulatory Visit: Payer: Self-pay | Admitting: Certified Registered Nurse Anesthetist

## 2013-06-02 ENCOUNTER — Other Ambulatory Visit (HOSPITAL_BASED_OUTPATIENT_CLINIC_OR_DEPARTMENT_OTHER): Payer: Medicare Other | Admitting: Lab

## 2013-06-02 ENCOUNTER — Ambulatory Visit (HOSPITAL_BASED_OUTPATIENT_CLINIC_OR_DEPARTMENT_OTHER): Payer: Medicare Other | Admitting: Physician Assistant

## 2013-06-02 ENCOUNTER — Encounter: Payer: Self-pay | Admitting: *Deleted

## 2013-06-02 ENCOUNTER — Encounter: Payer: Self-pay | Admitting: Physician Assistant

## 2013-06-02 ENCOUNTER — Encounter: Payer: Self-pay | Admitting: Oncology

## 2013-06-02 VITALS — BP 110/68 | HR 94 | Temp 98.8°F | Resp 20 | Ht 64.0 in | Wt 154.6 lb

## 2013-06-02 DIAGNOSIS — C50919 Malignant neoplasm of unspecified site of unspecified female breast: Secondary | ICD-10-CM

## 2013-06-02 DIAGNOSIS — Z171 Estrogen receptor negative status [ER-]: Secondary | ICD-10-CM

## 2013-06-02 DIAGNOSIS — Z5111 Encounter for antineoplastic chemotherapy: Secondary | ICD-10-CM

## 2013-06-02 DIAGNOSIS — C50111 Malignant neoplasm of central portion of right female breast: Secondary | ICD-10-CM

## 2013-06-02 DIAGNOSIS — D649 Anemia, unspecified: Secondary | ICD-10-CM

## 2013-06-02 LAB — COMPREHENSIVE METABOLIC PANEL (CC13)
Alkaline Phosphatase: 88 U/L (ref 40–150)
Glucose: 119 mg/dl (ref 70–140)
Sodium: 139 mEq/L (ref 136–145)
Total Bilirubin: 0.35 mg/dL (ref 0.20–1.20)
Total Protein: 6.8 g/dL (ref 6.4–8.3)

## 2013-06-02 LAB — CBC WITH DIFFERENTIAL/PLATELET
EOS%: 0.4 % (ref 0.0–7.0)
MCH: 32.2 pg (ref 25.1–34.0)
MCV: 97.2 fL (ref 79.5–101.0)
MONO%: 8.6 % (ref 0.0–14.0)
RBC: 2.89 10*6/uL — ABNORMAL LOW (ref 3.70–5.45)
RDW: 18.4 % — ABNORMAL HIGH (ref 11.2–14.5)
nRBC: 0 % (ref 0–0)

## 2013-06-02 MED ORDER — PACLITAXEL CHEMO INJECTION 300 MG/50ML
80.0000 mg/m2 | Freq: Once | INTRAVENOUS | Status: AC
  Administered 2013-06-02: 138 mg via INTRAVENOUS
  Filled 2013-06-02: qty 23

## 2013-06-02 MED ORDER — ONDANSETRON 16 MG/50ML IVPB (CHCC)
INTRAVENOUS | Status: AC
  Administered 2013-06-02: 16 mg via INTRAVENOUS
  Filled 2013-06-02: qty 16

## 2013-06-02 MED ORDER — DIPHENHYDRAMINE HCL 50 MG/ML IJ SOLN
INTRAMUSCULAR | Status: AC
  Filled 2013-06-02: qty 1

## 2013-06-02 MED ORDER — SODIUM CHLORIDE 0.9 % IJ SOLN
10.0000 mL | INTRAMUSCULAR | Status: DC | PRN
  Administered 2013-06-02: 10 mL
  Filled 2013-06-02: qty 10

## 2013-06-02 MED ORDER — HEPARIN SOD (PORK) LOCK FLUSH 100 UNIT/ML IV SOLN
250.0000 [IU] | Freq: Once | INTRAVENOUS | Status: AC | PRN
  Administered 2013-06-02: 500 [IU]
  Filled 2013-06-02: qty 5

## 2013-06-02 MED ORDER — SODIUM CHLORIDE 0.9 % IV SOLN
Freq: Once | INTRAVENOUS | Status: AC
  Administered 2013-06-02: 11:00:00 via INTRAVENOUS

## 2013-06-02 MED ORDER — SODIUM CHLORIDE 0.9 % IJ SOLN
3.0000 mL | INTRAMUSCULAR | Status: DC | PRN
  Filled 2013-06-02: qty 10

## 2013-06-02 MED ORDER — FAMOTIDINE IN NACL 20-0.9 MG/50ML-% IV SOLN
INTRAVENOUS | Status: AC
  Administered 2013-06-02: 20 mg via INTRAVENOUS
  Filled 2013-06-02: qty 50

## 2013-06-02 MED ORDER — ONDANSETRON 16 MG/50ML IVPB (CHCC)
16.0000 mg | Freq: Once | INTRAVENOUS | Status: AC
  Administered 2013-06-02: 16 mg via INTRAVENOUS

## 2013-06-02 MED ORDER — DEXAMETHASONE SODIUM PHOSPHATE 20 MG/5ML IJ SOLN
20.0000 mg | Freq: Once | INTRAMUSCULAR | Status: AC
  Administered 2013-06-02: 20 mg via INTRAVENOUS

## 2013-06-02 MED ORDER — FAMOTIDINE IN NACL 20-0.9 MG/50ML-% IV SOLN
20.0000 mg | Freq: Once | INTRAVENOUS | Status: AC
  Administered 2013-06-02: 20 mg via INTRAVENOUS

## 2013-06-02 MED ORDER — DEXAMETHASONE SODIUM PHOSPHATE 20 MG/5ML IJ SOLN
INTRAMUSCULAR | Status: AC
  Administered 2013-06-02: 20 mg via INTRAVENOUS
  Filled 2013-06-02: qty 5

## 2013-06-02 MED ORDER — SODIUM CHLORIDE 0.9 % IV SOLN
200.0000 mg | Freq: Once | INTRAVENOUS | Status: AC
  Administered 2013-06-02: 200 mg via INTRAVENOUS
  Filled 2013-06-02: qty 20

## 2013-06-02 MED ORDER — DIPHENHYDRAMINE HCL 50 MG/ML IJ SOLN
INTRAMUSCULAR | Status: AC
  Administered 2013-06-02: 50 mg via INTRAVENOUS
  Filled 2013-06-02: qty 1

## 2013-06-02 MED ORDER — DIPHENHYDRAMINE HCL 50 MG/ML IJ SOLN
50.0000 mg | Freq: Once | INTRAMUSCULAR | Status: AC
  Administered 2013-06-02: 50 mg via INTRAVENOUS

## 2013-06-02 NOTE — Progress Notes (Signed)
RECEIVED A FAX FROM [facility] CONCERNING A PRIOR AUTHORIZATION FOR ONDANSETRON. THIS REQUEST WAS PLACED IN THE MANAGED CARE BIN. 

## 2013-06-02 NOTE — Patient Instructions (Addendum)
Carboplatin injection  What is this medicine?  CARBOPLATIN (KAR boe pla tin) is a chemotherapy drug. It targets fast dividing cells, like cancer cells, and causes these cells to die. This medicine is used to treat ovarian cancer and many other cancers.  This medicine may be used for other purposes; ask your health care provider or pharmacist if you have questions.  What should I tell my health care provider before I take this medicine?  They need to know if you have any of these conditions:  -blood disorders  -hearing problems  -kidney disease  -recent or ongoing radiation therapy  -an unusual or allergic reaction to carboplatin, cisplatin, other chemotherapy, other medicines, foods, dyes, or preservatives  -pregnant or trying to get pregnant  -breast-feeding  How should I use this medicine?  This drug is usually given as an infusion into a vein. It is administered in a hospital or clinic by a specially trained health care professional.  Talk to your pediatrician regarding the use of this medicine in children. Special care may be needed.  Overdosage: If you think you have taken too much of this medicine contact a poison control center or emergency room at once.  NOTE: This medicine is only for you. Do not share this medicine with others.  What if I miss a dose?  It is important not to miss a dose. Call your doctor or health care professional if you are unable to keep an appointment.  What may interact with this medicine?  -medicines for seizures  -medicines to increase blood counts like filgrastim, pegfilgrastim, sargramostim  -some antibiotics like amikacin, gentamicin, neomycin, streptomycin, tobramycin  -vaccines  Talk to your doctor or health care professional before taking any of these medicines:  -acetaminophen  -aspirin  -ibuprofen  -ketoprofen  -naproxen  This list may not describe all possible interactions. Give your health care provider a list of all the medicines, herbs, non-prescription drugs, or dietary  supplements you use. Also tell them if you smoke, drink alcohol, or use illegal drugs. Some items may interact with your medicine.  What should I watch for while using this medicine?  Your condition will be monitored carefully while you are receiving this medicine. You will need important blood work done while you are taking this medicine.  This drug may make you feel generally unwell. This is not uncommon, as chemotherapy can affect healthy cells as well as cancer cells. Report any side effects. Continue your course of treatment even though you feel ill unless your doctor tells you to stop.  In some cases, you may be given additional medicines to help with side effects. Follow all directions for their use.  Call your doctor or health care professional for advice if you get a fever, chills or sore throat, or other symptoms of a cold or flu. Do not treat yourself. This drug decreases your body's ability to fight infections. Try to avoid being around people who are sick.  This medicine may increase your risk to bruise or bleed. Call your doctor or health care professional if you notice any unusual bleeding.  Be careful brushing and flossing your teeth or using a toothpick because you may get an infection or bleed more easily. If you have any dental work done, tell your dentist you are receiving this medicine.  Avoid taking products that contain aspirin, acetaminophen, ibuprofen, naproxen, or ketoprofen unless instructed by your doctor. These medicines may hide a fever.  Do not become pregnant while taking this medicine.   Do not breast-feed an infant while taking this medicine. What side effects may I notice from receiving this medicine? Side effects that you should report to your  doctor or health care professional as soon as possible: -allergic reactions like skin rash, itching or hives, swelling of the face, lips, or tongue -signs of infection - fever or chills, cough, sore throat, pain or difficulty passing urine -signs of decreased platelets or bleeding - bruising, pinpoint red spots on the skin, black, tarry stools, nosebleeds -signs of decreased red blood cells - unusually weak or tired, fainting spells, lightheadedness -breathing problems -changes in hearing -changes in vision -chest pain -high blood pressure -low blood counts - This drug may decrease the number of white blood cells, red blood cells and platelets. You may be at increased risk for infections and bleeding. -nausea and vomiting -pain, swelling, redness or irritation at the injection site -pain, tingling, numbness in the hands or feet -problems with balance, talking, walking -trouble passing urine or change in the amount of urine Side effects that usually do not require medical attention (report to your doctor or health care professional if they continue or are bothersome): -hair loss -loss of appetite -metallic taste in the mouth or changes in taste This list may not describe all possible side effects. Call your doctor for medical advice about side effects. You may report side effects to FDA at 1-800-FDA-1088. Where should I keep my medicine? This drug is given in a hospital or clinic and will not be stored at home. NOTE: This sheet is a summary. It may not cover all possible information. If you have questions about this medicine, talk to your doctor, pharmacist, or health care provider.  2012, [employer]vier/Gold Standard. (12/17/2007 2:38:05 PM)

## 2013-06-02 NOTE — Progress Notes (Signed)
ID: Tabitha Ramirez  MR#: [id number]  CSN#:[id number]  PCP: Tabitha Barrack, MD GYN:  Tabitha Ramirez SU: Tabitha Ramirez OTHER MD: Tabitha Ramirez   HISTORY OF PRESENT ILLNESS: Tabitha Ramirez tells me she felt a mass in her right breast about 3 months ago but initially ignored it. She eventually brought it to her physician's attention and was set up for bilateral diagnostic mammography at the [facility] Center 01/31/2013. This showed an area of distortion in the upper outer quadrant measuring approximately 4.3 cm. Diffuse calcifications in both breasts were stable. The mass was palpable at 12:00, 3 cm from the nipple, and by ultrasound it was irregular, hypoechoic, and measured 2.5 cm. In addition there was prominent right axillary adenopathy the largest lymph node measuring 2.5 cm.  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 Biopsy of the lymph node was negative. It did show some lymph node tissue.  Bilateral breast MRI 02/10/2013 showed a ring-enhancing necrotic-appearing 3.4 cm mass in the anterior third of the right breast. There was also a 2.0 cm level one right axillary lymph node that was prominent, but maintains central fat. The left breast the left axilla were benign and there was no internal mammary adenopathy noted.  The patient's subsequent history is as detailed below.  INTERVAL HISTORY: Tabitha Ramirez returns today accompanied by her daughter Tabitha Ramirez for followup of her locally advanced right breast carcinoma. She is due for her second of 12 planned weekly doses of carbo/paclitaxel today.   Tabitha Ramirez is feeling well today.  She tolerated her first cycle of carboplatin/paclitaxel very well last week. In fact, other than some mild queasiness she denies any notable side effects.   REVIEW OF SYSTEMS: Tabitha Ramirez has had no fevers or chills. Her energy level is fairly  good. She's had no rashes, abnormal bruising, or bleeding. Her nail beds are still dark, but she has had no nail bed tenderness and denies any drainage or evidence of infection. She's had no emesis and is having regular bowel movements. She's had no cough, phlegm production, shortness of breath, or chest pain. She denies any abnormal headaches, change in vision or dizziness.  With the exception of stable arthritic pain primarily affecting the right elbow and left knee, she she denies any unusual myalgias, arthralgias, bony pain, or peripheral swelling. She's had no signs of peripheral neuropathy in either the upper or lower extremities since treatment last week.  A detailed review of systems is otherwise noncontributory.    PAST MEDICAL HISTORY: Past Medical History  Diagnosis Date  . Heart murmur   . Hypertension   . Anemia   . Allergy   . Arthritis   . Breast cancer   . Migraines     PAST SURGICAL HISTORY: Past Surgical History  Procedure Laterality Date  . Abdominal hysterectomy      With bilateral salpingo-oophorectomy  . Portacath placement N/A 03/10/2013    Procedure: INSERTION PORT-A-CATH WITH FLUOROSCOPY AND ULTRASOUND;  Surgeon: Tabitha Mention, MD;  Location: [facility] OR;  Service: General;  Laterality: N/A;  . Axillary lymph node biopsy Right 03/10/2013    Procedure: AXILLARY LYMPH NODE BIOPSY;  Surgeon: Tabitha Mention, MD;  Location: [facility] OR;  Service: General;  Laterality: Right;  sentinel node with blue dye  . Esophagogastroduodenoscopy N/A 04/28/2013    Procedure: ESOPHAGOGASTRODUODENOSCOPY (EGD);  Surgeon: Tabitha Shiver, MD;  Location: [facility]  ENDOSCOPY;  Service: Endoscopy;  Laterality: N/A;  . Colonoscopy N/A 04/30/2013    Procedure: COLONOSCOPY;  Surgeon: Tabitha Shiver, MD;  Location: [facility] ENDOSCOPY;  Service: Endoscopy;  Laterality: N/A;    FAMILY HISTORY Family History  Problem Relation Age of Onset  . Cancer Mother   . Congestive Heart Failure Mother   . Cancer Brother   .  Diabetes Brother   . Heart disease Brother     AMI 05/22/2012  . Congestive Heart Failure Brother   . Cancer Maternal Grandmother    the patient's father died in an automobile accident at the age of 10. The patient's mother lived to be 76. Tabitha Ramirez has 3 brothers, no sisters. One brother had colon cancer at the age of 53. The patient's mother and the patient's mothers mother (maternal grandmother) both were diagnosed with some kind of cancer at the age of 79 or thereabouts, but Tabitha Ramirez does not know what type of cancer this may have been.  GYNECOLOGIC HISTORY:  Menarche age 75, first live birth age 19. The patient had her hysterectomy in the 1970s. She has been on estrogen replacement for she thinks 18 years.  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 She is divorced, lives by herself, with no pets. Her first child was given up for adoption. Her second child, Tabitha Ramirez, is a Geophysicist/field seismologist and is working towards a PhD at [employer]. Tabitha Ramirez has no grandchildren. She attends new [employer]    ADVANCED DIRECTIVES: In place. The patient has named her daughter Tabitha Ramirez as her healthcare power of attorney. Tabitha Ramirez can be reached at [phone]. The healthcare power of attorney documents were brought by the patient 03/13/2013 and are separately scanned.   HEALTH MAINTENANCE: History  Substance Use Topics  . Smoking status: Former Smoker    Types: Cigarettes  . Smokeless tobacco: Never Used     Comment: smoked in college 1 yr /occ wine  . Alcohol Use: No     Colonoscopy: August 2012  PAP:  Bone density: Never  Lipid panel:  Allergies  Allergen Reactions  . Penicillins Rash    Current Outpatient Prescriptions  Medication Sig Dispense Refill  . Acetaminophen (TYLENOL EXTRA STRENGTH PO) Take 1 tablet by mouth as needed.      Marland Kitchen dexamethasone (DECADRON) 4 MG  tablet       . fluconazole (DIFLUCAN) 100 MG tablet Take 200 mg by mouth See admin instructions. 2 tabs by mouth x 1 day, then 1 tab by mouth daily      . lidocaine-prilocaine (EMLA) cream Apply 1 application topically as needed (port access). Apply to port area 1-2 hours before chemotherapy; cover with plastioc wrap      . LORazepam (ATIVAN) 0.5 MG tablet Take 1 tablet (0.5 mg total) by mouth at bedtime as needed for anxiety.  20 tablet  0  . Multiple Vitamin (MULTIVITAMIN WITH MINERALS) TABS Take 1 tablet by mouth daily.      Marland Kitchen nystatin cream (MYCOSTATIN) Apply topically 2 (two) times daily.      . ondansetron (ZOFRAN) 8 MG tablet Take 1 tablet (8 mg total) by mouth every 12 (twelve) hours as needed for nausea.  20 tablet  3  . pantoprazole (PROTONIX) 40 MG tablet Take 1 tablet (40 mg total) by mouth daily at 12 noon.  30 tablet  0  . prochlorperazine (COMPAZINE)  10 MG tablet Take 10 mg by mouth every 6 (six) hours as needed (nausea/anxiety).      . triamterene-hydrochlorothiazide (MAXZIDE-25) 37.5-25 MG per tablet Take 1 tablet by mouth daily.  30 tablet  3   No current facility-administered medications for this visit.    OBJECTIVE: Middle-aged [place] American woman in no acute distress Filed Vitals:   06/02/13 1018  BP: 110/68  Pulse: 94  Temp: 98.8 F (37.1 C)  Resp: 20     Body mass index is 26.52 kg/(m^2).    ECOG FS: 1 Filed Weights   06/02/13 1018  Weight: 154 lb 9.6 oz (70.126 kg)   Sclerae unicteric Oropharynx is clear, no ulcerations or evidence of thrush No cervical or supraclavicular adenopathy Lungs clear to auscultation, no crackles or wheezes, good excursion bilaterally Heart regular rate and rhythm, systolic murmur appreciated Abdomen soft, nontender, positive bowel sounds MSK no focal spinal tenderness No peripheral edema Neuro: nonfocal, well oriented, positive affect Breasts: Deferred. Port is intact in the left upper chest wall . No erythema, edema, or  evidence of infection Skin: There is some hyperpigmentation on the hands and nailbeds bilaterally, but no evidence of cracking or peeling.  No evidence of drainage or infection in the nailbeds.   LAB RESULTS:   Lab Results  Component Value Date   WBC 2.5* 06/02/2013   NEUTROABS 1.4* 06/02/2013   HGB 9.3* 06/02/2013   HCT 28.1* 06/02/2013   MCV 97.2 06/02/2013   PLT 298 06/02/2013      Chemistry      Component Value Date/Time   NA 145 05/19/2013 1103   NA 141 04/30/2013 0530   K 3.5 05/19/2013 1103   K 3.2* 04/30/2013 0530   CL 106 04/30/2013 0530   CL 100 03/13/2013 0918   CO2 27 05/19/2013 1103   CO2 26 04/30/2013 0530   BUN 17.3 05/19/2013 1103   BUN 5* 04/30/2013 0530   CREATININE 0.9 05/19/2013 1103   CREATININE 0.75 04/30/2013 0530   CREATININE 0.94 11/29/2011 1535      Component Value Date/Time   CALCIUM 9.5 05/19/2013 1103   CALCIUM 8.7 04/30/2013 0530   ALKPHOS 115 05/19/2013 1103   ALKPHOS 96 02/28/2013 0937   AST 19 05/19/2013 1103   AST 27 02/28/2013 0937   ALT 27 05/19/2013 1103   ALT 27 02/28/2013 0937   BILITOT 0.58 05/19/2013 1103   BILITOT 0.3 02/28/2013 1610       STUDIES: Mr Breast Bilateral W Wo Contrast  05/05/2013   *RADIOLOGY REPORT*  Clinical Data:  The patient is status post chemotherapy for right breast cancer. Right axillary sentinel lymph node biopsy 03/10/2013, with one node positive for carcinoma.  MRI OF THE BILATERAL BREASTS WITHOUT AND WITH CONTRAST  Technique: Multiplanar, multisequence MR images of the right breast were obtained prior to and following the intravenous administration of 14ml of Multihance.  Comparison:  Recent imaging examinations.  FINDINGS:  Breast composition:  c:  Heterogeneous fibroglandular tissue  Background parenchymal enhancement:  Moderate  Left breast:  No mass or abnormal enhancement.  Right breast:  There is a necrotic right breast mass at 12 o'clock which has decreased in size.  This mass measures 2.5 x 1.9 x 1.9 cm versus 3.4 x 2.6 x 2.7 cm. There is  a fluid collection in the right breast measuring 2.5 x 2.5 x 1.6 cm.  There is associated smooth peripheral enhancement.  This is in the region of the previously biopsied right lymph  node and may represent a postoperative seroma/hematoma.  Mild right breast skin thickening is noted.  Lymph nodes:  No abnormal appearing lymph nodes.  Ancillary findings:  Small right pleural effusion is noted.  There is a small amount of pericardial fluid.  IMPRESSION: 1. Interval decrease in size of right breast mass, measuring 2.5 x 1.9 x 1.9 cm versus 3.4 x 2.6 x 2.7 cm. 2.  Smooth peripherally enhancing fluid collection in the area of the previously biopsied right lymph node.  This most likely represents a postoperative collection. 3. Small right pleural effusion. 4. Small pericardial effusion.  RECOMMENDATION: Treatment planning.  BI-RADS CATEGORY 6:  Known biopsy-proven malignancy - appropriate action should be taken.  THREE-DIMENSIONAL MR IMAGE RENDERING ON INDEPENDENT WORKSTATION:  Three-dimensional MR images were rendered by post-processing of the original MR data on a DynaCad workstation.  The three-dimensional MR images were interpreted, and findings were reported in the accompanying complete MRI report for this study.   Original Report Authenticated By: Jerene Dilling, M.D.     ASSESSMENT: 67 y.o. [place] woman   (1)  status post right central breast biopsy 01/31/2013 for a clinical T2 NX, stage II invasive ductal carcinoma, grade 3, triple negative, with an MIB-1 of 84%  (2) biopsy of an enlarged (2.5 cm) right axillary lymph node 01/31/2013 was negative, felt possibly discordant  (3) sentinel lymph node sampling 03/10/2013 showed one of two sentinel nodes to be positive; repeat prognostic profile pending  (4) neoadjuvant doxorubicin and cyclophosphamide being given in dose dense fashion with Neulasta support starting 03/17/2013, followed by weekly carboplatin and paclitaxel x12, with first cycle on  05/27/2013.  (5)  status post hospitalization in early August 2014 secondary to a significant drop in hemoglobin leading to syncope and loss of consciousness.   PLAN: Tabitha Ramirez will proceed to treatment today as scheduled for her second of 12 planned weekly doses of carboplatin/paclitaxel in the neoadjuvant setting. We'll continue to treat on a weekly basis, following her labs closely, and following her clinically for any signs of peripheral neuropathy. Specifically, I will see her again next week on September 15 prior to her third weekly dose of carboplatin/paclitaxel.  Tabitha Ramirez voices understanding and agreement with this plan, and will call with any changes or problems.   Jenniferann Stuckert, PA-C   06/02/2013 10:30 AM

## 2013-06-02 NOTE — Progress Notes (Signed)
[facility], [id number], approved ondansetron 8mg from 05/02/13-06/02/14. °

## 2013-06-09 ENCOUNTER — Telehealth: Payer: Self-pay | Admitting: Oncology

## 2013-06-09 ENCOUNTER — Telehealth: Payer: Self-pay | Admitting: *Deleted

## 2013-06-09 ENCOUNTER — Encounter: Payer: Self-pay | Admitting: Physician Assistant

## 2013-06-09 ENCOUNTER — Ambulatory Visit (HOSPITAL_BASED_OUTPATIENT_CLINIC_OR_DEPARTMENT_OTHER): Payer: Medicare Other | Admitting: Physician Assistant

## 2013-06-09 ENCOUNTER — Ambulatory Visit: Payer: Medicare Other

## 2013-06-09 ENCOUNTER — Ambulatory Visit (HOSPITAL_COMMUNITY)
Admission: RE | Admit: 2013-06-09 | Discharge: 2013-06-09 | Disposition: A | Discharge status: Home / Self Care | Payer: Medicare Other | Source: Ambulatory Visit | Attending: Oncology | Admitting: Oncology

## 2013-06-09 ENCOUNTER — Other Ambulatory Visit (HOSPITAL_BASED_OUTPATIENT_CLINIC_OR_DEPARTMENT_OTHER): Payer: Medicare Other | Admitting: Lab

## 2013-06-09 ENCOUNTER — Ambulatory Visit (HOSPITAL_BASED_OUTPATIENT_CLINIC_OR_DEPARTMENT_OTHER): Payer: Medicare Other

## 2013-06-09 VITALS — BP 121/68 | HR 94 | Temp 98.5°F | Resp 18 | Ht 64.0 in | Wt 156.0 lb

## 2013-06-09 DIAGNOSIS — C50111 Malignant neoplasm of central portion of right female breast: Secondary | ICD-10-CM

## 2013-06-09 DIAGNOSIS — M79601 Pain in right arm: Secondary | ICD-10-CM

## 2013-06-09 DIAGNOSIS — C50911 Malignant neoplasm of unspecified site of right female breast: Secondary | ICD-10-CM

## 2013-06-09 DIAGNOSIS — D649 Anemia, unspecified: Secondary | ICD-10-CM

## 2013-06-09 DIAGNOSIS — Z5111 Encounter for antineoplastic chemotherapy: Secondary | ICD-10-CM

## 2013-06-09 DIAGNOSIS — C50919 Malignant neoplasm of unspecified site of unspecified female breast: Secondary | ICD-10-CM

## 2013-06-09 DIAGNOSIS — Z171 Estrogen receptor negative status [ER-]: Secondary | ICD-10-CM

## 2013-06-09 LAB — CBC WITH DIFFERENTIAL/PLATELET
Basophils Absolute: 0.1 10*3/uL (ref 0.0–0.1)
Eosinophils Absolute: 0 10*3/uL (ref 0.0–0.5)
HGB: 8.3 g/dL — ABNORMAL LOW (ref 11.6–15.9)
LYMPH%: 27.6 % (ref 14.0–49.7)
MCV: 97.6 fL (ref 79.5–101.0)
MONO#: 0.5 10*3/uL (ref 0.1–0.9)
MONO%: 12.4 % (ref 0.0–14.0)
NEUT#: 2.1 10*3/uL (ref 1.5–6.5)
Platelets: 328 10*3/uL (ref 145–400)
WBC: 3.6 10*3/uL — ABNORMAL LOW (ref 3.9–10.3)

## 2013-06-09 MED ORDER — SODIUM CHLORIDE 0.9 % IV SOLN: 170.2000 mg | Freq: Once | INTRAVENOUS | Status: DC

## 2013-06-09 MED ORDER — DEXAMETHASONE SODIUM PHOSPHATE 20 MG/5ML IJ SOLN
INTRAMUSCULAR | Status: AC
  Filled 2013-06-09: qty 5

## 2013-06-09 MED ORDER — ONDANSETRON 16 MG/50ML IVPB (CHCC)
INTRAVENOUS | Status: AC
  Filled 2013-06-09: qty 16

## 2013-06-09 MED ORDER — ONDANSETRON 16 MG/50ML IVPB (CHCC)
16.0000 mg | Freq: Once | INTRAVENOUS | Status: AC
  Administered 2013-06-09: 16 mg via INTRAVENOUS

## 2013-06-09 MED ORDER — HEPARIN SOD (PORK) LOCK FLUSH 100 UNIT/ML IV SOLN
500.0000 [IU] | Freq: Once | INTRAVENOUS | Status: AC | PRN
  Administered 2013-06-09: 500 [IU]
  Filled 2013-06-09: qty 5

## 2013-06-09 MED ORDER — SODIUM CHLORIDE 0.9 % IJ SOLN
10.0000 mL | INTRAMUSCULAR | Status: DC | PRN
  Administered 2013-06-09: 10 mL
  Filled 2013-06-09: qty 10

## 2013-06-09 MED ORDER — FAMOTIDINE IN NACL 20-0.9 MG/50ML-% IV SOLN
20.0000 mg | Freq: Once | INTRAVENOUS | Status: AC
  Administered 2013-06-09: 20 mg via INTRAVENOUS

## 2013-06-09 MED ORDER — HYDROCODONE-ACETAMINOPHEN 5-325 MG PO TABS: 1.0000 | ORAL_TABLET | Freq: Four times a day (QID) | ORAL | Status: DC | PRN

## 2013-06-09 MED ORDER — SODIUM CHLORIDE 0.9 % IV SOLN
Freq: Once | INTRAVENOUS | Status: AC
  Administered 2013-06-09: 13:00:00 via INTRAVENOUS

## 2013-06-09 MED ORDER — DEXAMETHASONE SODIUM PHOSPHATE 20 MG/5ML IJ SOLN
20.0000 mg | Freq: Once | INTRAMUSCULAR | Status: AC
  Administered 2013-06-09: 20 mg via INTRAVENOUS

## 2013-06-09 MED ORDER — SODIUM CHLORIDE 0.9 % IV SOLN
200.0000 mg | Freq: Once | INTRAVENOUS | Status: AC
  Administered 2013-06-09: 200 mg via INTRAVENOUS
  Filled 2013-06-09: qty 20

## 2013-06-09 MED ORDER — DIPHENHYDRAMINE HCL 50 MG/ML IJ SOLN
50.0000 mg | Freq: Once | INTRAMUSCULAR | Status: AC
  Administered 2013-06-09: 50 mg via INTRAVENOUS

## 2013-06-09 MED ORDER — DIPHENHYDRAMINE HCL 50 MG/ML IJ SOLN
INTRAMUSCULAR | Status: AC
  Filled 2013-06-09: qty 1

## 2013-06-09 MED ORDER — PACLITAXEL CHEMO INJECTION 300 MG/50ML
80.0000 mg/m2 | Freq: Once | INTRAVENOUS | Status: AC
  Administered 2013-06-09: 138 mg via INTRAVENOUS
  Filled 2013-06-09: qty 23

## 2013-06-09 MED ORDER — FAMOTIDINE IN NACL 20-0.9 MG/50ML-% IV SOLN
INTRAVENOUS | Status: AC
  Filled 2013-06-09: qty 50

## 2013-06-09 NOTE — Telephone Encounter (Signed)
, °

## 2013-06-09 NOTE — Telephone Encounter (Signed)
Per staff message and POF I have scheduled appts.  JMW  

## 2013-06-09 NOTE — Progress Notes (Signed)
ID: Tabitha Ramirez OB: 29-Aug-1946  MR#: [id number]  CSN#:[id number]  PCP: Vivi Barrack, MD GYN:  Lavina Hamman SU: Claud Kelp OTHER MD: Lurline Hare   HISTORY OF PRESENT ILLNESS: Tabitha Ramirez tells me she felt a mass in her right breast about 3 months ago but initially ignored it. She eventually brought it to her physician's attention and was set up for bilateral diagnostic mammography at the [facility] Center 01/31/2013. This showed an area of distortion in the upper outer quadrant measuring approximately 4.3 cm. Diffuse calcifications in both breasts were stable. The mass was palpable at 12:00, 3 cm from the nipple, and by ultrasound it was irregular, hypoechoic, and measured 2.5 cm. In addition there was prominent right axillary adenopathy the largest lymph node measuring 2.5 cm.  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 Biopsy of the lymph node was negative. It did show some lymph node tissue.  Bilateral breast MRI 02/10/2013 showed a ring-enhancing necrotic-appearing 3.4 cm mass in the anterior third of the right breast. There was also a 2.0 cm level one right axillary lymph node that was prominent, but maintains central fat. The left breast the left axilla were benign and there was no internal mammary adenopathy noted.  The patient's subsequent history is as detailed below.  INTERVAL HISTORY: Tabitha Ramirez returns today accompanied by her daughter Tabitha Ramirez for followup of her locally advanced right breast carcinoma. She is due for her third of 12 planned weekly doses of carbo/paclitaxel today.   Tabitha Ramirez continues to tolerate the treatment well. In fact, her biggest complaint today is continued pain in the right elbow. She describes this as a dull ache in the right elbow, localized, with no radiating pain. It bothers her especially at night. She's had no injuries to the  right arm. The only thing she has tried is Tylenol which helps only minimally. She denies any swelling in the right upper extremity. She also denies any signs of peripheral neuropathy in either the upper or lower extremities.     REVIEW OF SYSTEMS: Kenzleigh has had no fevers or chills. Her energy level is fair. She's had no rashes, abnormal bruising, or bleeding. Specifically, she denies any blood in the stool, and has had no dark/tarry stools. She's had no emesis and is having regular bowel movements. She occasionally has very mild queasiness when she first wakes up in the morning, but this resolves easily without the use of antinausea medication.  Her nail beds are still dark, both hands and feet, but she has had no nail bed tenderness and denies any drainage or evidence of infection.  She's had no cough, phlegm production, shortness of breath, or chest pain. She denies any abnormal headaches, change in vision or dizziness. She denies any additional myalgias, arthralgias, bony pain, or peripheral swelling.   A detailed review of systems is otherwise noncontributory.    PAST MEDICAL HISTORY: Past Medical History  Diagnosis Date  . Heart murmur   . Hypertension   . Anemia   . Allergy   . Arthritis   . Breast cancer   . Migraines     PAST SURGICAL HISTORY: Past Surgical History  Procedure Laterality Date  . Abdominal hysterectomy      With bilateral salpingo-oophorectomy  . Portacath placement N/A 03/10/2013    Procedure: INSERTION PORT-A-CATH WITH FLUOROSCOPY AND ULTRASOUND;  Surgeon: Ernestene Mention, MD;  Location: [facility] OR;  Service: General;  Laterality: N/A;  . Axillary lymph node biopsy Right 03/10/2013    Procedure: AXILLARY LYMPH NODE BIOPSY;  Surgeon: Ernestene Mention, MD;  Location: [facility] OR;  Service: General;  Laterality: Right;  sentinel node with blue dye  . Esophagogastroduodenoscopy N/A 04/28/2013    Procedure: ESOPHAGOGASTRODUODENOSCOPY (EGD);  Surgeon: Graylin Shiver, MD;   Location: [facility];  Service: Endoscopy;  Laterality: N/A;  . Colonoscopy N/A 04/30/2013    Procedure: COLONOSCOPY;  Surgeon: Graylin Shiver, MD;  Location: [facility] END[facility]COPY;  Service: Endoscopy;  Laterality: N/A;    FAMILY HISTORY Family History  Problem Relation Age of Onset  . Cancer Mother   . Congestive Heart Failure Mother   . Cancer Brother   . Diabetes Brother   . Heart disease Brother     AMI 05/22/2012  . Congestive Heart Failure Brother   . Cancer Maternal Grandmother    the patient's father died in an automobile accident at the age of 23. The patient's mother lived to be 42. Tabitha Ramirez has 3 brothers, no sisters. One brother had colon cancer at the age of 13. The patient's mother and the patient's mothers mother (maternal grandmother) both were diagnosed with some kind of cancer at the age of 65 or thereabouts, but Tabitha Ramirez does not know what type of cancer this may have been.  GYNECOLOGIC HISTORY:  Menarche age 27, first live birth age 16. The patient had her hysterectomy in the 1970s. She has been on estrogen replacement for she thinks 18 years.  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 She is divorced, lives by herself, with no pets. Her first child was given up for adoption. Her second child, Tabitha Ramirez, is a Geophysicist/field seismologist and is working towards a PhD at [employer]. Anecia has no grandchildren. She attends new [employer]    ADVANCED DIRECTIVES: In place. The patient has named her daughter Tabitha Ramirez as her healthcare power of attorney. Tabitha Ramirez can be reached at [phone]. The healthcare power of attorney documents were brought by the patient 03/13/2013 and are separately scanned.   HEALTH MAINTENANCE: History  Substance Use Topics  . Smoking status: Former Smoker    Types: Cigarettes  . Smokeless tobacco: Never Used     Comment: smoked in  college 1 yr /occ wine  . Alcohol Use: No     Colonoscopy: August 2012  PAP:  Bone density: Never  Lipid panel:  Allergies  Allergen Reactions  . Penicillins Rash    Current Outpatient Prescriptions  Medication Sig Dispense Refill  . Acetaminophen (TYLENOL EXTRA STRENGTH PO) Take 1 tablet by mouth as needed.      Marland Kitchen dexamethasone (DECADRON) 4 MG tablet       . fluconazole (DIFLUCAN) 100 MG tablet Take 200 mg by mouth See admin instructions. 2 tabs by mouth x 1 day, then 1 tab by mouth daily      . HYDROcodone-acetaminophen (NORCO/VICODIN) 5-325 MG per tablet Take 1 tablet by mouth every 6 (six) hours as needed for pain.  30 tablet  0  . lidocaine-prilocaine (EMLA) cream Apply 1 application topically as needed (port access). Apply to port area 1-2 hours before chemotherapy; cover with plastioc wrap      . LORazepam (ATIVAN) 0.5 MG tablet Take 1 tablet (0.5 mg total) by mouth at bedtime as needed for anxiety.  20 tablet  0  . Multiple Vitamin (MULTIVITAMIN WITH MINERALS) TABS Take 1 tablet by mouth daily.      Marland Kitchen nystatin cream (MYCOSTATIN) Apply topically 2 (two) times daily.      . ondansetron (ZOFRAN) 8 MG tablet Take 1 tablet (8 mg total) by mouth every 12 (twelve) hours as needed for nausea.  20 tablet  3  . pantoprazole (PROTONIX) 40 MG tablet Take 1 tablet (40 mg total) by mouth daily at 12 noon.  30 tablet  0  . prochlorperazine (COMPAZINE) 10 MG tablet Take 10 mg by mouth every 6 (six) hours as needed (nausea/anxiety).      . triamterene-hydrochlorothiazide (MAXZIDE-25) 37.5-25 MG per tablet Take 1 tablet by mouth daily.  30 tablet  3   No current facility-administered medications for this visit.    OBJECTIVE: Middle-aged [place] American woman in no acute distress Filed Vitals:   06/09/13 1114  BP: 121/68  Pulse: 94  Temp: 98.5 F (36.9 C)  Resp: 18     Body mass index is 26.76 kg/(m^2).    ECOG FS: 1 Filed Weights   06/09/13 1114  Weight: 156 lb (70.761 kg)    Sclerae unicteric Oropharynx is clear, no ulcerations or evidence of thrush No cervical or supraclavicular adenopathy Lungs clear to auscultation, no crackles or wheezes, good excursion bilaterally Heart regular rate and rhythm, systolic murmur appreciated Abdomen soft, nontender, positive bowel sounds MSK no focal spinal tenderness No peripheral edema Neuro: nonfocal, well oriented, positive affect Breasts: In the right breast, there is a palpable mass in the central portion of the breast measuring 2.5-3 cm in diameter, and firm to palpation. There is a small palpable seroma in the right axilla, just inferior to the axillary incision. No lymphadenopathy palpated on the right. Left breast is unremarkable. Left axilla is benign, no palpable adenopathy.Samuel Bouche is intact in the left upper chest wall . No erythema, edema, or evidence of infection Skin: There is some hyperpigmentation on the hands and nailbeds bilaterally, but no evidence of cracking or peeling.  No evidence of drainage or infection in the nailbeds.   LAB RESULTS:   Lab Results  Component Value Date   WBC 3.6* 06/09/2013   NEUTROABS 2.1 06/09/2013   HGB 8.3* 06/09/2013   HCT 24.5* 06/09/2013   MCV 97.6 06/09/2013   PLT 328 06/09/2013      Chemistry      Component Value Date/Time   NA 139 06/02/2013 0948   NA 141 04/30/2013 0530   K 3.8 06/02/2013 0948   K 3.2* 04/30/2013 0530   CL 106 04/30/2013 0530   CL 100 03/13/2013 0918   CO2 28 06/02/2013 0948   CO2 26 04/30/2013 0530   BUN 16.1 06/02/2013 0948   BUN 5* 04/30/2013 0530   CREATININE 0.9 06/02/2013 0948   CREATININE 0.75 04/30/2013 0530   CREATININE 0.94 11/29/2011 1535      Component Value Date/Time   CALCIUM 9.6 06/02/2013 0948   CALCIUM 8.7 04/30/2013 0530   ALKPHOS 88 06/02/2013 0948   ALKPHOS 96 02/28/2013 0937   AST 16 06/02/2013 0948   AST 27 02/28/2013 0937   ALT 22 06/02/2013 0948   ALT 27 02/28/2013 0937   BILITOT 0.35 06/02/2013 0948   BILITOT 0.3 02/28/2013 4696        STUDIES: Mr Breast Bilateral W Wo Contrast  05/05/2013   *RADIOLOGY REPORT*  Clinical Data:  The patient is status post chemotherapy for right breast cancer. Right  axillary sentinel lymph node biopsy 03/10/2013, with one node positive for carcinoma.  MRI OF THE BILATERAL BREASTS WITHOUT AND WITH CONTRAST  Technique: Multiplanar, multisequence MR images of the right breast were obtained prior to and following the intravenous administration of 14ml of Multihance.  Comparison:  Recent imaging examinations.  FINDINGS:  Breast composition:  c:  Heterogeneous fibroglandular tissue  Background parenchymal enhancement:  Moderate  Left breast:  No mass or abnormal enhancement.  Right breast:  There is a necrotic right breast mass at 12 o'clock which has decreased in size.  This mass measures 2.5 x 1.9 x 1.9 cm versus 3.4 x 2.6 x 2.7 cm. There is a fluid collection in the right breast measuring 2.5 x 2.5 x 1.6 cm.  There is associated smooth peripheral enhancement.  This is in the region of the previously biopsied right lymph node and may represent a postoperative seroma/hematoma.  Mild right breast skin thickening is noted.  Lymph nodes:  No abnormal appearing lymph nodes.  Ancillary findings:  Small right pleural effusion is noted.  There is a small amount of pericardial fluid.  IMPRESSION: 1. Interval decrease in size of right breast mass, measuring 2.5 x 1.9 x 1.9 cm versus 3.4 x 2.6 x 2.7 cm. 2.  Smooth peripherally enhancing fluid collection in the area of the previously biopsied right lymph node.  This most likely represents a postoperative collection. 3. Small right pleural effusion. 4. Small pericardial effusion.  RECOMMENDATION: Treatment planning.  BI-RADS CATEGORY 6:  Known biopsy-proven malignancy - appropriate action should be taken.  THREE-DIMENSIONAL MR IMAGE RENDERING ON INDEPENDENT WORKSTATION:  Three-dimensional MR images were rendered by post-processing of the original MR data on a DynaCad  workstation.  The three-dimensional MR images were interpreted, and findings were reported in the accompanying complete MRI report for this study.   Original Report Authenticated By: Jerene Dilling, M.D.     ASSESSMENT: 67 y.o. Tabitha Ramirez woman   (1)  status post right central breast biopsy 01/31/2013 for a clinical T2 NX, stage II invasive ductal carcinoma, grade 3, triple negative, with an MIB-1 of 84%  (2) biopsy of an enlarged (2.5 cm) right axillary lymph node 01/31/2013 was negative, felt possibly discordant  (3) sentinel lymph node sampling 03/10/2013 showed one of two sentinel nodes to be positive; repeat prognostic profile pending  (4) neoadjuvant doxorubicin and cyclophosphamide being given in dose dense fashion with Neulasta support starting 03/17/2013, followed by weekly carboplatin and paclitaxel x12, with first cycle on 05/27/2013.  (5)  status post hospitalization in early August 2014 secondary to a significant drop in hemoglobin leading to syncope and loss of consciousness.  (6)  Symptomatic anemia, likely chemo-induced  (7) Right elbow pain   PLAN: Tabitha Ramirez will proceed to treatment today as scheduled for her third of 12 planned weekly doses of carboplatin/paclitaxel in the neoadjuvant setting. We'll continue to treat on a weekly basis, following her labs closely, and following her clinically for any signs of peripheral neuropathy.  With a hemoglobin of 8.3 today, we are also going to schedule Tabitha Ramirez for a blood transfusion this week, hopefully tomorrow, 06/10/2013. She will receive 2 units of packed red blood cells. I have asked her to watch closely for any evidence of abnormal bleeding, specifically any blood in the stool. We will continue to follow her anemia very closely.  I have prescribed hydrocodone/APAP 5/325 mg, one tablet every 6 hours as needed for her elbow pain. I think what would probably help this pain the  most would be an anti-inflammatory, but I am hesitant  to have Tabitha Ramirez take any anti-inflammatory drugs secondary to her history of GI bleed. I did caution her not to take Tylenol as long as she is taking the hydrocodone/APAP and she voiced her understanding.  I will see Tabitha Ramirez again next week on September 22 in anticipation of her fourth weekly dose of carboplatin/paclitaxel.  Tabitha Ramirez voices understanding and agreement with this plan, and will call with any changes or problems.   Tabitha Smoot, PA-C   06/09/2013 12:19 PM

## 2013-06-09 NOTE — Patient Instructions (Signed)
Cancer Center Discharge Instructions for Patients Receiving Chemotherapy  Today you received the following chemotherapy agents Taxol and Carboplatin.  To help prevent nausea and vomiting after your treatment, we encourage you to take your nausea medication.   If you develop nausea and vomiting that is not controlled by your nausea medication, call the clinic.   BELOW ARE SYMPTOMS THAT SHOULD BE REPORTED IMMEDIATELY:  *FEVER GREATER THAN 100.5 F  *CHILLS WITH OR WITHOUT FEVER  NAUSEA AND VOMITING THAT IS NOT CONTROLLED WITH YOUR NAUSEA MEDICATION  *UNUSUAL SHORTNESS OF BREATH  *UNUSUAL BRUISING OR BLEEDING  TENDERNESS IN MOUTH AND THROAT WITH OR WITHOUT PRESENCE OF ULCERS  *URINARY PROBLEMS  *BOWEL PROBLEMS  UNUSUAL RASH Items with * indicate a potential emergency and should be followed up as soon as possible.  Feel free to call the clinic you have any questions or concerns. The clinic phone number is [phone].    

## 2013-06-10 ENCOUNTER — Other Ambulatory Visit: Payer: Medicare Other | Admitting: Lab

## 2013-06-10 ENCOUNTER — Ambulatory Visit (HOSPITAL_BASED_OUTPATIENT_CLINIC_OR_DEPARTMENT_OTHER): Payer: Medicare Other

## 2013-06-10 ENCOUNTER — Ambulatory Visit: Payer: Medicare Other | Admitting: Family

## 2013-06-10 VITALS — BP 132/68 | HR 81 | Temp 98.4°F | Resp 18

## 2013-06-10 DIAGNOSIS — D649 Anemia, unspecified: Secondary | ICD-10-CM

## 2013-06-10 MED ORDER — SODIUM CHLORIDE 0.9 % IJ SOLN
10.0000 mL | INTRAMUSCULAR | Status: AC | PRN
  Administered 2013-06-10: 10 mL
  Filled 2013-06-10: qty 10

## 2013-06-10 MED ORDER — HEPARIN SOD (PORK) LOCK FLUSH 100 UNIT/ML IV SOLN
500.0000 [IU] | Freq: Every day | INTRAVENOUS | Status: AC | PRN
  Administered 2013-06-10: 500 [IU]
  Filled 2013-06-10: qty 5

## 2013-06-10 MED ORDER — HEPARIN SOD (PORK) LOCK FLUSH 100 UNIT/ML IV SOLN
250.0000 [IU] | INTRAVENOUS | Status: DC | PRN
  Filled 2013-06-10: qty 5

## 2013-06-10 MED ORDER — SODIUM CHLORIDE 0.9 % IJ SOLN
3.0000 mL | INTRAMUSCULAR | Status: DC | PRN
  Filled 2013-06-10: qty 10

## 2013-06-10 MED ORDER — ACETAMINOPHEN 325 MG PO TABS
650.0000 mg | ORAL_TABLET | Freq: Once | ORAL | Status: AC
  Administered 2013-06-10: 650 mg via ORAL

## 2013-06-10 MED ORDER — ACETAMINOPHEN 325 MG PO TABS
ORAL_TABLET | ORAL | Status: AC
  Filled 2013-06-10: qty 2

## 2013-06-10 MED ORDER — DIPHENHYDRAMINE HCL 25 MG PO CAPS
ORAL_CAPSULE | ORAL | Status: AC
  Filled 2013-06-10: qty 1

## 2013-06-10 MED ORDER — DIPHENHYDRAMINE HCL 25 MG PO CAPS
25.0000 mg | ORAL_CAPSULE | Freq: Once | ORAL | Status: AC
  Administered 2013-06-10: 25 mg via ORAL

## 2013-06-10 MED ORDER — SODIUM CHLORIDE 0.9 % IV SOLN
250.0000 mL | Freq: Once | INTRAVENOUS | Status: AC
  Administered 2013-06-10: 250 mL via INTRAVENOUS

## 2013-06-10 NOTE — Progress Notes (Signed)
First unit blood started at 0953 at 50 cc's per hour.  VSS and no signs of reaction.  Blood rate increased to 185 ml/hr. At 1008

## 2013-06-10 NOTE — Progress Notes (Signed)
Blood continues at 185 cc/hr

## 2013-06-10 NOTE — Progress Notes (Signed)
Second unit blood ended at this time.

## 2013-06-10 NOTE — Progress Notes (Signed)
Second unit of blood infused from 1205 to 1405 and was tolerated well.

## 2013-06-10 NOTE — Patient Instructions (Signed)
Blood Transfusion  A blood transfusion replaces your blood or some of its parts. Blood is replaced when you have lost blood because of surgery, an accident, or for severe blood conditions like anemia. You can donate blood to be used on yourself if you have a planned surgery. If you lose blood during that surgery, your own blood can be given back to you. Any blood given to you is checked to make sure it matches your blood type. Your temperature, blood pressure, and heart rate (vital signs) will be checked often.  GET HELP RIGHT AWAY IF:   You feel sick to your stomach (nauseous) or throw up (vomit).  You have watery poop (diarrhea).  You have shortness of breath or trouble breathing.  You have blood in your pee (urine) or have dark colored pee.  You have chest pain or tightness.  Your eyes or skin turn yellow (jaundice).  You have a temperature by mouth above 102 F (38.9 C), not controlled by medicine.  You start to shake and have chills.  You develop a a red rash (hives) or feel itchy.  You develop lightheadedness or feel confused.  You develop back, joint, or muscle pain.  You do not feel hungry (lost appetite).  You feel tired, restless, or nervous.  You develop belly (abdominal) cramps. Document Released: 12/08/2008 Document Revised: 12/04/2011 Document Reviewed: 12/08/2008 Exit[employer] Patient Information 2014 [employer].  

## 2013-06-10 NOTE — Progress Notes (Signed)
1326 Blood continues at 185 ml/hr

## 2013-06-10 NOTE — Progress Notes (Signed)
First unit blood ended.  No reaction noted.

## 2013-06-10 NOTE — Progress Notes (Signed)
Second unit blood started at 1205 at 50 ml/hr x 13 ml and being tolerated well.  VSS and blood rate increased to 185 ml/hr.

## 2013-06-11 LAB — TYPE AND SCREEN: Unit division: 0

## 2013-06-12 ENCOUNTER — Encounter: Payer: Self-pay | Admitting: Oncology

## 2013-06-16 ENCOUNTER — Encounter: Payer: Self-pay | Admitting: Physician Assistant

## 2013-06-16 ENCOUNTER — Ambulatory Visit (HOSPITAL_BASED_OUTPATIENT_CLINIC_OR_DEPARTMENT_OTHER): Payer: Medicare Other | Admitting: Physician Assistant

## 2013-06-16 ENCOUNTER — Other Ambulatory Visit (HOSPITAL_BASED_OUTPATIENT_CLINIC_OR_DEPARTMENT_OTHER): Payer: Medicare Other | Admitting: Lab

## 2013-06-16 ENCOUNTER — Ambulatory Visit (HOSPITAL_BASED_OUTPATIENT_CLINIC_OR_DEPARTMENT_OTHER): Payer: Medicare Other

## 2013-06-16 VITALS — BP 130/76 | HR 89 | Temp 98.8°F | Resp 18 | Ht 64.0 in | Wt 155.7 lb

## 2013-06-16 DIAGNOSIS — M25521 Pain in right elbow: Secondary | ICD-10-CM | POA: Insufficient documentation

## 2013-06-16 DIAGNOSIS — C50119 Malignant neoplasm of central portion of unspecified female breast: Secondary | ICD-10-CM

## 2013-06-16 DIAGNOSIS — D649 Anemia, unspecified: Secondary | ICD-10-CM

## 2013-06-16 DIAGNOSIS — Z5111 Encounter for antineoplastic chemotherapy: Secondary | ICD-10-CM

## 2013-06-16 DIAGNOSIS — C50111 Malignant neoplasm of central portion of right female breast: Secondary | ICD-10-CM

## 2013-06-16 DIAGNOSIS — M25529 Pain in unspecified elbow: Secondary | ICD-10-CM

## 2013-06-16 LAB — COMPREHENSIVE METABOLIC PANEL (CC13)
ALT: 24 U/L (ref 0–55)
Alkaline Phosphatase: 95 U/L (ref 40–150)
CO2: 26 mEq/L (ref 22–29)
Potassium: 3.5 mEq/L (ref 3.5–5.1)
Sodium: 139 mEq/L (ref 136–145)
Total Bilirubin: 0.3 mg/dL (ref 0.20–1.20)
Total Protein: 7.3 g/dL (ref 6.4–8.3)

## 2013-06-16 LAB — CBC WITH DIFFERENTIAL/PLATELET
Eosinophils Absolute: 0 10*3/uL (ref 0.0–0.5)
MCV: 93.7 fL (ref 79.5–101.0)
MONO%: 12.1 % (ref 0.0–14.0)
NEUT#: 1.9 10*3/uL (ref 1.5–6.5)
RBC: 3.67 10*6/uL — ABNORMAL LOW (ref 3.70–5.45)
RDW: 18.1 % — ABNORMAL HIGH (ref 11.2–14.5)
WBC: 3.3 10*3/uL — ABNORMAL LOW (ref 3.9–10.3)
lymph#: 0.9 10*3/uL (ref 0.9–3.3)
nRBC: 0 % (ref 0–0)

## 2013-06-16 MED ORDER — DEXAMETHASONE SODIUM PHOSPHATE 20 MG/5ML IJ SOLN
20.0000 mg | Freq: Once | INTRAMUSCULAR | Status: AC
  Administered 2013-06-16: 20 mg via INTRAVENOUS

## 2013-06-16 MED ORDER — DEXAMETHASONE SODIUM PHOSPHATE 20 MG/5ML IJ SOLN
INTRAMUSCULAR | Status: AC
  Filled 2013-06-16: qty 5

## 2013-06-16 MED ORDER — HEPARIN SOD (PORK) LOCK FLUSH 100 UNIT/ML IV SOLN
500.0000 [IU] | Freq: Once | INTRAVENOUS | Status: AC | PRN
  Administered 2013-06-16: 500 [IU]
  Filled 2013-06-16: qty 5

## 2013-06-16 MED ORDER — PACLITAXEL CHEMO INJECTION 300 MG/50ML
80.0000 mg/m2 | Freq: Once | INTRAVENOUS | Status: AC
  Administered 2013-06-16: 138 mg via INTRAVENOUS
  Filled 2013-06-16: qty 23

## 2013-06-16 MED ORDER — ONDANSETRON 16 MG/50ML IVPB (CHCC)
INTRAVENOUS | Status: AC
  Filled 2013-06-16: qty 16

## 2013-06-16 MED ORDER — DIPHENHYDRAMINE HCL 50 MG/ML IJ SOLN
INTRAMUSCULAR | Status: AC
  Filled 2013-06-16: qty 1

## 2013-06-16 MED ORDER — ONDANSETRON 16 MG/50ML IVPB (CHCC)
16.0000 mg | Freq: Once | INTRAVENOUS | Status: AC
  Administered 2013-06-16: 16 mg via INTRAVENOUS

## 2013-06-16 MED ORDER — SODIUM CHLORIDE 0.9 % IJ SOLN
10.0000 mL | INTRAMUSCULAR | Status: DC | PRN
  Administered 2013-06-16: 10 mL
  Filled 2013-06-16: qty 10

## 2013-06-16 MED ORDER — TRAMADOL HCL 50 MG PO TABS: 50.0000 mg | ORAL_TABLET | Freq: Four times a day (QID) | ORAL | Status: DC | PRN

## 2013-06-16 MED ORDER — SODIUM CHLORIDE 0.9 % IV SOLN
200.0000 mg | Freq: Once | INTRAVENOUS | Status: AC
  Administered 2013-06-16: 200 mg via INTRAVENOUS
  Filled 2013-06-16: qty 20

## 2013-06-16 MED ORDER — DIPHENHYDRAMINE HCL 50 MG/ML IJ SOLN
50.0000 mg | Freq: Once | INTRAMUSCULAR | Status: AC
  Administered 2013-06-16: 50 mg via INTRAVENOUS

## 2013-06-16 MED ORDER — FAMOTIDINE IN NACL 20-0.9 MG/50ML-% IV SOLN
20.0000 mg | Freq: Once | INTRAVENOUS | Status: AC
  Administered 2013-06-16: 20 mg via INTRAVENOUS

## 2013-06-16 MED ORDER — SODIUM CHLORIDE 0.9 % IV SOLN
Freq: Once | INTRAVENOUS | Status: AC
  Administered 2013-06-16: 13:00:00 via INTRAVENOUS

## 2013-06-16 MED ORDER — FAMOTIDINE IN NACL 20-0.9 MG/50ML-% IV SOLN
INTRAVENOUS | Status: AC
  Filled 2013-06-16: qty 50

## 2013-06-16 NOTE — Progress Notes (Signed)
ID: Tabitha Ramirez OB: 11/18/1945  MR#: [id number]  CSN#:[id number]  PCP: Zenaida Niece, MD GYN:  Lavina Hamman SU: Claud Kelp OTHER MD: Lurline Hare   HISTORY OF PRESENT ILLNESS: Tabitha Ramirez tells me she felt a mass in her right breast about 3 months ago but initially ignored it. She eventually brought it to her physician's attention and was set up for bilateral diagnostic mammography at the [facility] Center 01/31/2013. This showed an area of distortion in the upper outer quadrant measuring approximately 4.3 cm. Diffuse calcifications in both breasts were stable. The mass was palpable at 12:00, 3 cm from the nipple, and by ultrasound it was irregular, hypoechoic, and measured 2.5 cm. In addition there was prominent right axillary adenopathy the largest lymph node measuring 2.5 cm.  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 Biopsy of the lymph node was negative. It did show some lymph node tissue.  Bilateral breast MRI 02/10/2013 showed a ring-enhancing necrotic-appearing 3.4 cm mass in the anterior third of the right breast. There was also a 2.0 cm level one right axillary lymph node that was prominent, but maintains central fat. The left breast the left axilla were benign and there was no internal mammary adenopathy noted.  The patient's subsequent history is as detailed below.  INTERVAL HISTORY: Tabitha Ramirez returns today accompanied by her daughter Tabitha Ramirez for followup of her locally advanced right breast carcinoma. She is due for her fourth of 12 planned weekly doses of carbo/paclitaxel today.   Tabitha Ramirez has few complaints today, and continues to tolerate the chemotherapy well. She continues to have some pain in her right elbow, but notes that it is improving slightly. She is unable to take the hydrocodone/APAP secondary to headaches. She's been taking Tylenol, but  finds that it is "not quite enough" for the pain. We discussed other options such as tramadol.   Once again, she describes the pain as a dull ache, but there is no numbness or tingling, and no radiating pain. She also denies any signs of peripheral neuropathy affecting either the upper or lower extremities. She's had no peripheral swelling.      REVIEW OF SYSTEMS: Tabitha Ramirez has had no fevers or chills. Her energy level is fair, and she still has some problems sleeping at times. She's had no rashes or skin changes other than mild hyperpigmentation. Her nail beds are dark, but not particularly tender. She's had no abnormal bruising or bleeding other than some slight blood in the mucus when she blows her nose.  She denies any blood in the stool, and has had no dark/tarry stools. Her appetite is good, and she is drinking plenty of water to keep herself well hydrated. She's had no mouth ulcers, oral sensitivity, or problems swallowing. She's had no nausea or emesis and is having regular bowel movements.  She's had no cough, phlegm production, shortness of breath, or chest pain. She denies any abnormal headaches, change in vision or dizziness. She denies any additional myalgias, arthralgias, or bony pain.  A detailed review of systems is otherwise noncontributory.    PAST MEDICAL HISTORY: Past Medical History  Diagnosis Date  . Heart murmur   . Hypertension   . Anemia   . Allergy   . Arthritis   . Breast cancer   . Migraines     PAST SURGICAL HISTORY: Past Surgical History  Procedure Laterality Date  .  Abdominal hysterectomy      With bilateral salpingo-oophorectomy  . Portacath placement N/A 03/10/2013    Procedure: INSERTION PORT-A-CATH WITH FLUOROSCOPY AND ULTRASOUND;  Surgeon: Ernestene Mention, MD;  Location: [facility] OR;  Service: General;  Laterality: N/A;  . Axillary lymph node biopsy Right 03/10/2013    Procedure: AXILLARY LYMPH NODE BIOPSY;  Surgeon: Ernestene Mention, MD;  Location: [facility] OR;   Service: General;  Laterality: Right;  sentinel node with blue dye  . Esophagogastroduodenoscopy N/A 04/28/2013    Procedure: ESOPHAGOGASTRODUODENOSCOPY (EGD);  Surgeon: Graylin Shiver, MD;  Location: [facility];  Service: Endoscopy;  Laterality: N/A;  . Colonoscopy N/A 04/30/2013    Procedure: COLONOSCOPY;  Surgeon: Graylin Shiver, MD;  Location: [facility] ENDOSCOPY;  Service: Endoscopy;  Laterality: N/A;    FAMILY HISTORY Family History  Problem Relation Age of Onset  . Cancer Mother   . Congestive Heart Failure Mother   . Cancer Brother   . Diabetes Brother   . Heart disease Brother     AMI 05/22/2012  . Congestive Heart Failure Brother   . Cancer Maternal Grandmother    the patient's father died in an automobile accident at the age of 56. The patient's mother lived to be 48. Tabitha Ramirez has 3 brothers, no sisters. One brother had colon cancer at the age of 52. The patient's mother and the patient's mothers mother (maternal grandmother) both were diagnosed with some kind of cancer at the age of 61 or thereabouts, but Tabitha Ramirez does not know what type of cancer this may have been.  GYNECOLOGIC HISTORY:  Menarche age 22, first live birth age 56. The patient had her hysterectomy in the 1970s. She has been on estrogen replacement for she thinks 18 years.  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 She is divorced, lives by herself, with no pets. Her first child was given up for adoption. Her second child, Tabitha Ramirez, is a Geophysicist/field seismologist and is working towards a PhD at [employer]. Tabitha Ramirez has no grandchildren. She attends new [employer]    ADVANCED DIRECTIVES: In place. The patient has named her daughter Tabitha Ramirez as her healthcare power of attorney. Tabitha Ramirez can be reached at [phone]. The healthcare power of attorney documents were brought by the patient 03/13/2013 and are  separately scanned.   HEALTH MAINTENANCE: History  Substance Use Topics  . Smoking status: Former Smoker    Types: Cigarettes  . Smokeless tobacco: Never Used     Comment: smoked in college 1 yr /occ wine  . Alcohol Use: No     Colonoscopy: August 2012  PAP:  Bone density: Never  Lipid panel:  Allergies  Allergen Reactions  . Penicillins Rash    Current Outpatient Prescriptions  Medication Sig Dispense Refill  . Acetaminophen (TYLENOL EXTRA STRENGTH PO) Take 1 tablet by mouth as needed.      Marland Kitchen dexamethasone (DECADRON) 4 MG tablet       . diphenhydrAMINE (SOMINEX) 25 MG tablet Take 25 mg by mouth at bedtime as needed for sleep.      . fluconazole (DIFLUCAN) 100 MG tablet Take 200 mg by mouth See admin instructions. 2 tabs by mouth x 1 day, then 1 tab by mouth daily      . lidocaine-prilocaine (EMLA) cream Apply 1 application topically as needed (port access). Apply to port area 1-2 hours before chemotherapy; cover  with plastioc wrap      . LORazepam (ATIVAN) 0.5 MG tablet Take 1 tablet (0.5 mg total) by mouth at bedtime as needed for anxiety.  20 tablet  0  . Multiple Vitamin (MULTIVITAMIN WITH MINERALS) TABS Take 1 tablet by mouth daily.      Marland Kitchen nystatin cream (MYCOSTATIN) Apply topically 2 (two) times daily.      . ondansetron (ZOFRAN) 8 MG tablet Take 1 tablet (8 mg total) by mouth every 12 (twelve) hours as needed for nausea.  20 tablet  3  . pantoprazole (PROTONIX) 40 MG tablet Take 1 tablet (40 mg total) by mouth daily at 12 noon.  30 tablet  0  . prochlorperazine (COMPAZINE) 10 MG tablet Take 10 mg by mouth every 6 (six) hours as needed (nausea/anxiety).      . triamterene-hydrochlorothiazide (MAXZIDE-25) 37.5-25 MG per tablet Take 1 tablet by mouth daily.  30 tablet  3  . traMADol (ULTRAM) 50 MG tablet Take 1 tablet (50 mg total) by mouth every 6 (six) hours as needed for pain.  60 tablet  0   No current facility-administered medications for this visit.    OBJECTIVE:  Middle-aged [place] American woman in no acute distress Filed Vitals:   06/16/13 1127  BP: 130/76  Pulse: 89  Temp: 98.8 F (37.1 C)  Resp: 18     Body mass index is 26.71 kg/(m^2).    ECOG FS: 1 Filed Weights   06/16/13 1127  Weight: 155 lb 11.2 oz (70.625 kg)   Physical Exam: HEENT:  Sclerae anicteric.  Oropharynx clear. No ulcerations or evidence of candidiasis. Tongue is hyperpigmented. Nasal mucosa is erythematous. NODES:  No cervical or supraclavicular lymphadenopathy palpated.  BREAST EXAM:  Deferred. There is a small palpable abnormality in the right axilla, consistent with known seroma just inferior to the axillary incision. Left axilla is benign with no palpable adenopathy. LUNGS:  Clear to auscultation bilaterally.  No wheezes or rhonchi HEART:  Regular rate and rhythm. No murmur appreciated. ABDOMEN:  Soft, nontender.  Positive bowel sounds.  MSK:  No focal spinal tenderness to palpation. Full range of motion in the upper tremor these, including the right arm and right elbow. EXTREMITIES:  No peripheral edema.   SKIN:  Port is intact in the left upper chest wall with no erythema, edema, or evidence of infection. Nailbeds are hyperpigmented bilaterally in the upper extremities, but with no drainage or evidence of infection noted. NEURO:  Nonfocal. Well oriented.  Friendly affect.   LAB RESULTS:   Lab Results  Component Value Date   WBC 3.3* 06/16/2013   NEUTROABS 1.9 06/16/2013   HGB 11.9 06/16/2013   HCT 34.4* 06/16/2013   MCV 93.7 06/16/2013   PLT 218 06/16/2013      Chemistry      Component Value Date/Time   NA 139 06/02/2013 0948   NA 141 04/30/2013 0530   K 3.8 06/02/2013 0948   K 3.2* 04/30/2013 0530   CL 106 04/30/2013 0530   CL 100 03/13/2013 0918   CO2 28 06/02/2013 0948   CO2 26 04/30/2013 0530   BUN 16.1 06/02/2013 0948   BUN 5* 04/30/2013 0530   CREATININE 0.9 06/02/2013 0948   CREATININE 0.75 04/30/2013 0530   CREATININE 0.94 11/29/2011 1535      Component Value  Date/Time   CALCIUM 9.6 06/02/2013 0948   CALCIUM 8.7 04/30/2013 0530   ALKPHOS 88 06/02/2013 0948   ALKPHOS 96 02/28/2013 0937   AST  16 06/02/2013 0948   AST 27 02/28/2013 0937   ALT 22 06/02/2013 0948   ALT 27 02/28/2013 0937   BILITOT 0.35 06/02/2013 0948   BILITOT 0.3 02/28/2013 0937       STUDIES: No results found.    ASSESSMENT: 67 y.o. [place] woman   (1)  status post right central breast biopsy 01/31/2013 for a clinical T2 NX, stage II invasive ductal carcinoma, grade 3, triple negative, with an MIB-1 of 84%  (2) biopsy of an enlarged (2.5 cm) right axillary lymph node 01/31/2013 was negative, felt possibly discordant  (3) sentinel lymph node sampling 03/10/2013 showed one of two sentinel nodes to be positive; repeat prognostic profile pending  (4) neoadjuvant doxorubicin and cyclophosphamide being given in dose dense fashion with Neulasta support starting 03/17/2013, followed by weekly carboplatin and paclitaxel x12, with first cycle on 05/27/2013.  (5)  status post hospitalization in early August 2014 secondary to a significant drop in hemoglobin leading to syncope and loss of consciousness.  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  Status post blood transfusion, 2 units of packed red blood cells, on 06/10/2013.  (7) Right elbow pain, slightly improved   PLAN: Tabitha Ramirez will proceed to treatment today as scheduled for her fourth of 12 planned weekly doses of carboplatin/paclitaxel in the neoadjuvant setting. We'll continue to follow her closely on weekly basis, and she is scheduled to see Norina Buzzard NP-C next week on September 29 for labs and physical exam prior to her fifth weekly dose of chemotherapy. We will pay close attention to her anemia (the goal being to keep her hemoglobin above 8.0), and will follow her clinically for any signs of increased peripheral neuropathy secondary to the paclitaxel.  Although Junice's right elbow pain has improved slightly, she still finds it  Tylenol is not quite enough to control her pain. I am prescribing tramadol, 50 mg up to 4 times daily as needed for pain. I also suggested that when to try saline nasal spray a couple times daily to decrease her mild epistasis.    Jereline voices understanding and agreement with this plan, and will call with any changes or problems.   Millette Halberstam, PA-C   06/16/2013 12:15 PM

## 2013-06-16 NOTE — Patient Instructions (Addendum)
[facility] Discharge Instructions for Patients Receiving Chemotherapy  Today you received the following chemotherapy agents: taxol, carboplatin  To help prevent nausea and vomiting after your treatment, we encourage you to take your nausea medication.  Take it as often as prescribed.     If you develop nausea and vomiting that is not controlled by your nausea medication, call the clinic. If it is after clinic hours your family physician or the after hours number for the clinic or go to the Emergency Department.   BELOW ARE SYMPTOMS THAT SHOULD BE REPORTED IMMEDIATELY:  *FEVER GREATER THAN 100.5 F  *CHILLS WITH OR WITHOUT FEVER  NAUSEA AND VOMITING THAT IS NOT CONTROLLED WITH YOUR NAUSEA MEDICATION  *UNUSUAL SHORTNESS OF BREATH  *UNUSUAL BRUISING OR BLEEDING  TENDERNESS IN MOUTH AND THROAT WITH OR WITHOUT PRESENCE OF ULCERS  *URINARY PROBLEMS  *BOWEL PROBLEMS  UNUSUAL RASH Items with * indicate a potential emergency and should be followed up as soon as possible.  Feel free to call the clinic you have any questions or concerns. The clinic phone number is [phone].   I have been informed and understand all the instructions given to me. I know to contact the clinic, my physician, or go to the Emergency Department if any problems should occur. I do not have any questions at this time, but understand that I may call the clinic during office hours   should I have any questions or need assistance in obtaining follow up care.    __________________________________________  _____________  __________ Signature of Patient or Authorized Representative            Date                   Time    __________________________________________ Nurse's Signature    

## 2013-06-23 ENCOUNTER — Ambulatory Visit (HOSPITAL_BASED_OUTPATIENT_CLINIC_OR_DEPARTMENT_OTHER): Payer: Medicare Other

## 2013-06-23 ENCOUNTER — Other Ambulatory Visit (HOSPITAL_BASED_OUTPATIENT_CLINIC_OR_DEPARTMENT_OTHER): Payer: Medicare Other

## 2013-06-23 ENCOUNTER — Encounter: Payer: Self-pay | Admitting: Family

## 2013-06-23 ENCOUNTER — Ambulatory Visit (HOSPITAL_BASED_OUTPATIENT_CLINIC_OR_DEPARTMENT_OTHER): Payer: Medicare Other | Admitting: Family

## 2013-06-23 VITALS — BP 120/77 | HR 92 | Temp 98.8°F | Resp 18 | Ht 64.0 in | Wt 155.2 lb

## 2013-06-23 DIAGNOSIS — K219 Gastro-esophageal reflux disease without esophagitis: Secondary | ICD-10-CM

## 2013-06-23 DIAGNOSIS — C50111 Malignant neoplasm of central portion of right female breast: Secondary | ICD-10-CM

## 2013-06-23 DIAGNOSIS — C50919 Malignant neoplasm of unspecified site of unspecified female breast: Secondary | ICD-10-CM

## 2013-06-23 DIAGNOSIS — Z5111 Encounter for antineoplastic chemotherapy: Secondary | ICD-10-CM

## 2013-06-23 DIAGNOSIS — Z171 Estrogen receptor negative status [ER-]: Secondary | ICD-10-CM

## 2013-06-23 DIAGNOSIS — M25529 Pain in unspecified elbow: Secondary | ICD-10-CM

## 2013-06-23 DIAGNOSIS — C50119 Malignant neoplasm of central portion of unspecified female breast: Secondary | ICD-10-CM

## 2013-06-23 DIAGNOSIS — D6481 Anemia due to antineoplastic chemotherapy: Secondary | ICD-10-CM

## 2013-06-23 LAB — CBC WITH DIFFERENTIAL/PLATELET
BASO%: 1.1 % (ref 0.0–2.0)
Basophils Absolute: 0 10*3/uL (ref 0.0–0.1)
Eosinophils Absolute: 0 10*3/uL (ref 0.0–0.5)
HCT: 33.8 % — ABNORMAL LOW (ref 34.8–46.6)
LYMPH%: 22.9 % (ref 14.0–49.7)
MCHC: 34.3 g/dL (ref 31.5–36.0)
MCV: 93.9 fL (ref 79.5–101.0)
MONO#: 0.5 10*3/uL (ref 0.1–0.9)
MONO%: 14.4 % — ABNORMAL HIGH (ref 0.0–14.0)
NEUT#: 2.1 10*3/uL (ref 1.5–6.5)
NEUT%: 60.5 % (ref 38.4–76.8)
Platelets: 149 10*3/uL (ref 145–400)
RBC: 3.6 10*6/uL — ABNORMAL LOW (ref 3.70–5.45)
WBC: 3.5 10*3/uL — ABNORMAL LOW (ref 3.9–10.3)
lymph#: 0.8 10*3/uL — ABNORMAL LOW (ref 0.9–3.3)
nRBC: 0 % (ref 0–0)

## 2013-06-23 MED ORDER — FAMOTIDINE IN NACL 20-0.9 MG/50ML-% IV SOLN
20.0000 mg | Freq: Once | INTRAVENOUS | Status: AC
  Administered 2013-06-23: 20 mg via INTRAVENOUS

## 2013-06-23 MED ORDER — DIPHENHYDRAMINE HCL 50 MG/ML IJ SOLN
50.0000 mg | Freq: Once | INTRAMUSCULAR | Status: AC
  Administered 2013-06-23: 50 mg via INTRAVENOUS

## 2013-06-23 MED ORDER — PANTOPRAZOLE SODIUM 40 MG PO TBEC: 40.0000 mg | DELAYED_RELEASE_TABLET | Freq: Every day | ORAL | Status: DC

## 2013-06-23 MED ORDER — DIPHENHYDRAMINE HCL 50 MG/ML IJ SOLN
INTRAMUSCULAR | Status: AC
  Filled 2013-06-23: qty 1

## 2013-06-23 MED ORDER — FAMOTIDINE IN NACL 20-0.9 MG/50ML-% IV SOLN
INTRAVENOUS | Status: AC
  Filled 2013-06-23: qty 50

## 2013-06-23 MED ORDER — ONDANSETRON 16 MG/50ML IVPB (CHCC)
16.0000 mg | Freq: Once | INTRAVENOUS | Status: AC
  Administered 2013-06-23: 16 mg via INTRAVENOUS

## 2013-06-23 MED ORDER — HEPARIN SOD (PORK) LOCK FLUSH 100 UNIT/ML IV SOLN
500.0000 [IU] | Freq: Once | INTRAVENOUS | Status: AC | PRN
  Administered 2013-06-23: 500 [IU]
  Filled 2013-06-23: qty 5

## 2013-06-23 MED ORDER — PACLITAXEL CHEMO INJECTION 300 MG/50ML
80.0000 mg/m2 | Freq: Once | INTRAVENOUS | Status: AC
  Administered 2013-06-23: 138 mg via INTRAVENOUS
  Filled 2013-06-23: qty 23

## 2013-06-23 MED ORDER — DEXAMETHASONE SODIUM PHOSPHATE 20 MG/5ML IJ SOLN
INTRAMUSCULAR | Status: AC
  Filled 2013-06-23: qty 5

## 2013-06-23 MED ORDER — ONDANSETRON 16 MG/50ML IVPB (CHCC)
INTRAVENOUS | Status: AC
  Filled 2013-06-23: qty 16

## 2013-06-23 MED ORDER — SODIUM CHLORIDE 0.9 % IJ SOLN
10.0000 mL | INTRAMUSCULAR | Status: DC | PRN
  Administered 2013-06-23: 10 mL
  Filled 2013-06-23: qty 10

## 2013-06-23 MED ORDER — DEXAMETHASONE SODIUM PHOSPHATE 20 MG/5ML IJ SOLN
20.0000 mg | Freq: Once | INTRAMUSCULAR | Status: AC
  Administered 2013-06-23: 20 mg via INTRAVENOUS

## 2013-06-23 MED ORDER — SODIUM CHLORIDE 0.9 % IV SOLN
Freq: Once | INTRAVENOUS | Status: AC
  Administered 2013-06-23: 12:00:00 via INTRAVENOUS

## 2013-06-23 MED ORDER — SODIUM CHLORIDE 0.9 % IV SOLN
200.0000 mg | Freq: Once | INTRAVENOUS | Status: AC
  Administered 2013-06-23: 200 mg via INTRAVENOUS
  Filled 2013-06-23: qty 20

## 2013-06-23 NOTE — Patient Instructions (Addendum)
[facility] Discharge Instructions for Patients Receiving Chemotherapy  Today you received the following chemotherapy agents: Carboplatin, VP-16.   To help prevent nausea and vomiting after your treatment, we encourage you to take your nausea medication.  If you develop nausea and vomiting that is not controlled by your nausea medication, call the clinic.   BELOW ARE SYMPTOMS THAT SHOULD BE REPORTED IMMEDIATELY:  *FEVER GREATER THAN 100.5 F  *CHILLS WITH OR WITHOUT FEVER  NAUSEA AND VOMITING THAT IS NOT CONTROLLED WITH YOUR NAUSEA MEDICATION  *UNUSUAL SHORTNESS OF BREATH  *UNUSUAL BRUISING OR BLEEDING  TENDERNESS IN MOUTH AND THROAT WITH OR WITHOUT PRESENCE OF ULCERS  *URINARY PROBLEMS  *BOWEL PROBLEMS  UNUSUAL RASH Items with * indicate a potential emergency and should be followed up as soon as possible.  Feel free to call the clinic you have any questions or concerns. The clinic phone number is [phone].    

## 2013-06-23 NOTE — Patient Instructions (Addendum)
Please contact us at [phone] if you have any questions or concerns.  Please continue to do well and enjoy life!!!  Get plenty of rest, drink plenty of water, exercise daily (walking), eat a balanced diet.  Results for orders placed in visit on 06/23/13 (from the past 24 hour(s))  CBC WITH DIFFERENTIAL     Status: Abnormal   Collection Time    06/23/13 10:05 AM      Result Value Range   WBC 3.5 (*) 3.9 - 10.3 10e3/uL   NEUT# 2.1  1.5 - 6.5 10e3/uL   HGB 11.6  11.6 - 15.9 g/dL   HCT 16.1 (*) 09.6 - 04.5 %   Platelets 149  145 - 400 10e3/uL   MCV 93.9  79.5 - 101.0 fL   MCH 32.2  25.1 - 34.0 pg   MCHC 34.3  31.5 - 36.0 g/dL   RBC 4.09 (*) 8.11 - 9.14 10e6/uL   RDW 17.8 (*) 11.2 - 14.5 %   lymph# 0.8 (*) 0.9 - 3.3 10e3/uL   MONO# 0.5  0.1 - 0.9 10e3/uL   Eosinophils Absolute 0.0  0.0 - 0.5 10e3/uL   Basophils Absolute 0.0  0.0 - 0.1 10e3/uL   NEUT% 60.5  38.4 - 76.8 %   LYMPH% 22.9  14.0 - 49.7 %   MONO% 14.4 (*) 0.0 - 14.0 %   EOS% 1.1  0.0 - 7.0 %   BASO% 1.1  0.0 - 2.0 %   nRBC 0  0 - 0 %   Narrative:    Performed At:  [facility]               [address]               [place], [place] [address]

## 2013-06-23 NOTE — Progress Notes (Signed)
[facility]  [phone] [phone]  OFFICE PROGRESS NOTE    ID: Tabitha Ramirez OB: 1946-06-19  MR#: [id number]  JYN#:[id number]  PCP: Zenaida Niece, MD GYN:  Lavina Hamman, M.D. SU: Claud Kelp, M.D. OTHER MD: Lurline Hare, M.D.   HISTORY OF PRESENT ILLNESS: Tabitha Ramirez felt a mass in her right breast for a few months but initially ignored it. She eventually brought it to her physician's attention and was set up for bilateral diagnostic mammography at [facility] 01/31/2013. This showed an area of distortion in the upper outer quadrant measuring approximately 4.3 cm. Diffuse calcifications in both breasts were stable. The mass was palpable at 12:00, 3 cm from the nipple, and by ultrasound it was irregular, hypoechoic, and measured 2.5 cm. In addition there was prominent right axillary adenopathy the largest lymph node measuring 2.5 cm.  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 Biopsy of the lymph node was negative. It did show some lymph node tissue.  Bilateral breast MRI 02/10/2013 showed a ring-enhancing necrotic-appearing 3.4 cm mass in the anterior third of the right breast. There was also a 2.0 cm level one right axillary lymph node that was prominent, but maintains central fat. The left breast the left axilla were benign and there was no internal mammary adenopathy noted.  The patient's subsequent history is as detailed below.   INTERVAL HISTORY: Tabitha Ramirez returns today accompanied by her daughter Tabitha Ramirez for followup of her locally advanced right breast carcinoma. She is due for her 5th of 12 planned weekly doses of carbo/paclitaxel today.  Overall she is tolerating chemotherapy well and has minor complaints.  Her interval history is otherwise stable and unremarkable.      REVIEW OF SYSTEMS: A 10  point review of systems was completed and is negative except intermittent mild right elbow pain relieved by tramadol, continuing dry nare mucosa bilaterally which subsequently leads to limited bleeding/epistaxis in her nares, nausea 3 days ago relieved by antiemetic, and a stiff jaw, but the patient is an admitted "teeth grinder" when sleeping.  The patient purchasing a bite guard was suggested for tooth grinding. Tabitha Ramirez denies any other symptomatology including no fevers or chills. She's had no rashes or skin changes other than mild hyperpigmentation. Her nail beds are dark, but not particularly tender. She's had no abnormal bruising or bleeding other than some slight blood in the mucus when she blows her nose.  She denies any blood in the stool, and has had no dark/tarry stools. Her appetite is good, and she is drinking plenty of water to keep herself well hydrated. She's had no mouth ulcers, oral sensitivity, or problems swallowing. She's had no nausea or emesis and is having regular bowel movements.  She's had no cough, phlegm production, shortness of breath, or chest pain. She denies any abnormal headaches, change in vision or dizziness. She denies any additional myalgias, arthralgias, or bony pain.   A detailed review of systems is otherwise noncontributory.   PAST MEDICAL HISTORY: Past Medical History  Diagnosis Date  . Heart murmur   . Hypertension   . Anemia   . Allergy   . Arthritis   . Breast cancer   . Migraines     PAST SURGICAL HISTORY: Past Surgical History  Procedure Laterality Date  . Abdominal hysterectomy      With bilateral salpingo-oophorectomy  .  Portacath placement N/A 03/10/2013    Procedure: INSERTION PORT-A-CATH WITH FLUOROSCOPY AND ULTRASOUND;  Surgeon: Ernestene Mention, MD;  Location: [facility] OR;  Service: General;  Laterality: N/A;  . Axillary lymph node biopsy Right 03/10/2013    Procedure: AXILLARY LYMPH NODE BIOPSY;  Surgeon: Ernestene Mention, MD;  Location: [facility] OR;   Service: General;  Laterality: Right;  sentinel node with blue dye  . Esophagogastroduodenoscopy N/A 04/28/2013    Procedure: ESOPHAGOGASTRODUODENOSCOPY (EGD);  Surgeon: Graylin Shiver, MD;  Location: [facility];  Service: Endoscopy;  Laterality: N/A;  . Colonoscopy N/A 04/30/2013    Procedure: COLONOSCOPY;  Surgeon: Graylin Shiver, MD;  Location: [facility] END[facility]COPY;  Service: Endoscopy;  Laterality: N/A;    FAMILY HISTORY Family History  Problem Relation Age of Onset  . Cancer Mother 41    Unknown type of cancer  . Congestive Heart Failure Mother   . Colon cancer Brother   . Diabetes Brother 40  . Heart disease Brother     AMI 05/22/2012  . Congestive Heart Failure Brother   . Cancer Maternal Grandmother 44    Unknown type of cancer  The patient's father died in an automobile accident at the age of 33. The patient's mother lived to be 33. Tabitha Ramirez has 3 brothers, no sisters.     GYNECOLOGIC HISTORY:  Menarche age 37, first live birth age 41. The patient had her hysterectomy in the 1970s. She has been on estrogen replacement for approximately 18 years.   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 She is divorced, lives by herself, with no pets. Her first child was given up for adoption. Her second child, Tabitha Ramirez, is a Geophysicist/field seismologist and is working towards a PhD at [employer]. Tabitha Ramirez has no grandchildren. She attends [employer]    ADVANCED DIRECTIVES: In place. The patient has named her daughter Tabitha Ramirez as her healthcare power of attorney. Tabitha Ramirez can be reached at [phone]. The healthcare power of attorney documents were brought by the patient 03/13/2013 and are separately scanned.   HEALTH MAINTENANCE: History  Substance Use Topics  . Smoking status: Former Smoker    Types: Cigarettes  . Smokeless tobacco: Never Used     Comment: smoked in  college 1 yr /occ wine  . Alcohol Use: No     Colonoscopy: August 2012  PAP: Not on file  Bone density: Never  Lipid panel: 11/29/2011  Allergies  Allergen Reactions  . Penicillins Rash    Current Outpatient Prescriptions  Medication Sig Dispense Refill  . Acetaminophen (TYLENOL EXTRA STRENGTH PO) Take 1 tablet by mouth as needed.      Marland Kitchen dexamethasone (DECADRON) 4 MG tablet Take 4 mg by mouth daily with breakfast.       . diphenhydrAMINE (SOMINEX) 25 MG tablet Take 25 mg by mouth at bedtime as needed for sleep.      Marland Kitchen lidocaine-prilocaine (EMLA) cream Apply 1 application topically as needed (port access). Apply to port area 1-2 hours before chemotherapy; cover with plastioc wrap      . LORazepam (ATIVAN) 0.5 MG tablet Take 1 tablet (0.5 mg total) by mouth at bedtime as needed for anxiety.  20 tablet  0  . Multiple Vitamin (MULTIVITAMIN WITH MINERALS) TABS Take 1 tablet by mouth daily.      . ondansetron (ZOFRAN) 8 MG tablet Take 1 tablet (8 mg  total) by mouth every 12 (twelve) hours as needed for nausea.  20 tablet  3  . pantoprazole (PROTONIX) 40 MG tablet Take 1 tablet (40 mg total) by mouth daily at 12 noon.  30 tablet  2  . prochlorperazine (COMPAZINE) 10 MG tablet Take 10 mg by mouth every 6 (six) hours as needed (nausea/anxiety).      . traMADol (ULTRAM) 50 MG tablet Take 1 tablet (50 mg total) by mouth every 6 (six) hours as needed for pain.  60 tablet  0  . triamterene-hydrochlorothiazide (MAXZIDE-25) 37.5-25 MG per tablet Take 1 tablet by mouth daily.  30 tablet  3  . fluconazole (DIFLUCAN) 100 MG tablet Take 200 mg by mouth See admin instructions. 2 tabs by mouth x 1 day, then 1 tab by mouth daily      . nystatin cream (MYCOSTATIN) Apply topically 2 (two) times daily.       No current facility-administered medications for this visit.   Facility-Administered Medications Ordered in Other Visits  Medication Dose Route Frequency Provider Last Rate Last Dose  . heparin lock  flush 100 unit/mL  500 Units Intracatheter Once PRN Amy Allegra Grana, PA-C      . sodium chloride 0.9 % injection 10 mL  10 mL Intracatheter PRN Amy Allegra Grana, PA-C        OBJECTIVE: Middle-aged [place] woman in no acute distress  Filed Vitals:   06/23/13 1043  BP: 120/77  Pulse: 92  Temp: 98.8 F (37.1 C)  Resp: 18     Body mass index is 26.63 kg/(m^2).    ECOG FS: 1 - Symptomatic but completely ambulatory Filed Weights   06/23/13 1043  Weight: 155 lb 3.2 oz (70.398 kg)    PHYSICAL EXAM: General appearance: Alert, cooperative, well nourished, no apparent distress Head: Normocephalic, without obvious abnormality, atraumatic, oromucosa and tongue are hyperpigmented, no oral candidiasis Eyes: Arcus senilis, PERRLA, EOMI Nose: Nares, septum and mucosa are normal, no drainage or sinus tenderness Neck: No adenopathy, supple, symmetrical, trachea midline, no tenderness Resp: Clear to auscultation bilaterally, no wheezes/rales/rhonchi Cardio: Regular rate and rhythm, S1, S2 normal, no murmur, click, rub or gallop, no edema, left chest Port-A-Cath covered in EMLA cream and an occlusive dressing Breasts: Deferred GI: Soft, distended, non-tender, hypoactive bowel sounds, no organomegaly Skin: No rashes/lesions, skin warm and dry, no erythematous areas, no cyanosis, hyperpigmented nail beds  M/S:  Atraumatic, normal strength in all extremities, normal range of motion, no clubbing  Lymph nodes: Cervical and supraclavicular lymph nodes are normal Neurologic: Grossly normal, cranial nerves II through XII intact, alert and oriented x 3 Psych: Appropriate affect   LAB RESULTS: Lab Results  Component Value Date   WBC 3.5* 06/23/2013   NEUTROABS 2.1 06/23/2013   HGB 11.6 06/23/2013   HCT 33.8* 06/23/2013   MCV 93.9 06/23/2013   PLT 149 06/23/2013      Chemistry      Component Value Date/Time   NA 139 06/16/2013 1054   NA 141 04/30/2013 0530   K 3.5 06/16/2013 1054   K 3.2* 04/30/2013 0530    CL 106 04/30/2013 0530   CL 100 03/13/2013 0918   CO2 26 06/16/2013 1054   CO2 26 04/30/2013 0530   BUN 13.0 06/16/2013 1054   BUN 5* 04/30/2013 0530   CREATININE 0.8 06/16/2013 1054   CREATININE 0.75 04/30/2013 0530   CREATININE 0.94 11/29/2011 1535      Component Value Date/Time   CALCIUM 9.5 06/16/2013 1054  CALCIUM 8.7 04/30/2013 0530   ALKPHOS 95 06/16/2013 1054   ALKPHOS 96 02/28/2013 0937   AST 20 06/16/2013 1054   AST 27 02/28/2013 0937   ALT 24 06/16/2013 1054   ALT 27 02/28/2013 0937   BILITOT 0.30 06/16/2013 1054   BILITOT 0.3 02/28/2013 0937      STUDIES: No results found.    ASSESSMENT: 67 y.o. [employer], [place] woman   (1)  Status post right central breast biopsy 01/31/2013 for a clinical T2 NX, stage II invasive ductal carcinoma, grade 3, triple negative, with an MIB-1 of 84%.  (2) Biopsy of an enlarged (2.5 cm) right axillary lymph node 01/31/2013 was negative, felt possibly discordant.  (3) Sentinel lymph node sampling 03/10/2013 showed one of two sentinel nodes to be positive; estrogen receptor 3% positive, progesterone receptor 6% positive, Ki-67 50%, HER-2/neu by CISH no amplification with a ratio of 1.16.  (4) Neoadjuvant chemotherapy with doxorubicin and cyclophosphamide being given in dose dense fashion with Neulasta support starting 03/17/2013, followed by weekly carboplatin and paclitaxel x 12, with first cycle on 05/27/2013.  (5)  Status post hospitalization in early 04/2013 secondary to a significant drop in hemoglobin leading to syncope and loss of consciousness.  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  Her last transfusion of 2 units of packed red blood cells was on 06/10/2013.  (7) Right elbow pain, slightly improved  (8) Mild epistaxis (bleeding limited to the inner mucosa of nares bilaterally)   (9) Jaw pain  PLAN: Ms. Kuipers will proceed with treatment today as scheduled for her fifth of 12 planned weekly doses of neoadjuvant  carboplatin/paclitaxel.  We'll continue to follow her on a weekly basis.  She is scheduled for her next office visit/laboratory/assessment on 06/30/2013.  We are closely following her chemotherapy induced anemia with a goal of keeping her hemoglobin above 8.0.  We are also closely observing clinically for any signs/symptoms of peripheral neuropathy secondary to paclitaxel.  Her elbow pain is relieved when taking Tramadol.  Over-the-counter nasal saline solution to be applied to each near twice a day was again recommended for mild epistaxis.  Humidified air was also suggested.  Jaw pain may be secondary to teeth grinding at night.  Purchasing a bite guard for nighttime use was suggested.  All questions answered.  Ms. Klepacki and her daughter were encouraged to contact us in the interim with any questions, problems, or concerns.   Larina Bras, NP-C 06/23/2013 2:28 PM

## 2013-06-30 ENCOUNTER — Telehealth: Payer: Self-pay | Admitting: *Deleted

## 2013-06-30 ENCOUNTER — Other Ambulatory Visit (HOSPITAL_BASED_OUTPATIENT_CLINIC_OR_DEPARTMENT_OTHER): Payer: Medicare Other | Admitting: Lab

## 2013-06-30 ENCOUNTER — Encounter: Payer: Self-pay | Admitting: Physician Assistant

## 2013-06-30 ENCOUNTER — Ambulatory Visit (HOSPITAL_BASED_OUTPATIENT_CLINIC_OR_DEPARTMENT_OTHER): Payer: Medicare Other | Admitting: Physician Assistant

## 2013-06-30 ENCOUNTER — Other Ambulatory Visit: Payer: Medicare Other | Admitting: Lab

## 2013-06-30 ENCOUNTER — Ambulatory Visit (HOSPITAL_BASED_OUTPATIENT_CLINIC_OR_DEPARTMENT_OTHER): Payer: Medicare Other

## 2013-06-30 ENCOUNTER — Ambulatory Visit: Payer: Medicare Other | Admitting: Family

## 2013-06-30 VITALS — BP 121/78 | HR 100 | Temp 97.9°F | Resp 18 | Ht 64.0 in | Wt 153.2 lb

## 2013-06-30 DIAGNOSIS — M25521 Pain in right elbow: Secondary | ICD-10-CM

## 2013-06-30 DIAGNOSIS — D6959 Other secondary thrombocytopenia: Secondary | ICD-10-CM

## 2013-06-30 DIAGNOSIS — C50119 Malignant neoplasm of central portion of unspecified female breast: Secondary | ICD-10-CM

## 2013-06-30 DIAGNOSIS — C50111 Malignant neoplasm of central portion of right female breast: Secondary | ICD-10-CM

## 2013-06-30 DIAGNOSIS — D649 Anemia, unspecified: Secondary | ICD-10-CM

## 2013-06-30 DIAGNOSIS — Z5111 Encounter for antineoplastic chemotherapy: Secondary | ICD-10-CM

## 2013-06-30 DIAGNOSIS — D696 Thrombocytopenia, unspecified: Secondary | ICD-10-CM

## 2013-06-30 DIAGNOSIS — M25529 Pain in unspecified elbow: Secondary | ICD-10-CM

## 2013-06-30 LAB — CBC WITH DIFFERENTIAL/PLATELET
BASO%: 1.8 % (ref 0.0–2.0)
EOS%: 0.7 % (ref 0.0–7.0)
HCT: 33.3 % — ABNORMAL LOW (ref 34.8–46.6)
HGB: 11.5 g/dL — ABNORMAL LOW (ref 11.6–15.9)
LYMPH%: 31.5 % (ref 14.0–49.7)
MCH: 32.3 pg (ref 25.1–34.0)
MCHC: 34.5 g/dL (ref 31.5–36.0)
MCV: 93.5 fL (ref 79.5–101.0)
MONO#: 0.4 10*3/uL (ref 0.1–0.9)
MONO%: 12.7 % (ref 0.0–14.0)
NEUT%: 53.3 % (ref 38.4–76.8)
RBC: 3.56 10*6/uL — ABNORMAL LOW (ref 3.70–5.45)
WBC: 2.8 10*3/uL — ABNORMAL LOW (ref 3.9–10.3)
lymph#: 0.9 10*3/uL (ref 0.9–3.3)
nRBC: 0 % (ref 0–0)

## 2013-06-30 LAB — COMPREHENSIVE METABOLIC PANEL (CC13)
ALT: 30 U/L (ref 0–55)
AST: 25 U/L (ref 5–34)
Alkaline Phosphatase: 77 U/L (ref 40–150)
BUN: 15.8 mg/dL (ref 7.0–26.0)
CO2: 27 mEq/L (ref 22–29)
Calcium: 9.6 mg/dL (ref 8.4–10.4)
Chloride: 104 mEq/L (ref 98–109)
Creatinine: 0.9 mg/dL (ref 0.6–1.1)
Potassium: 3.3 mEq/L — ABNORMAL LOW (ref 3.5–5.1)

## 2013-06-30 MED ORDER — DIPHENHYDRAMINE HCL 50 MG/ML IJ SOLN
INTRAMUSCULAR | Status: AC
  Filled 2013-06-30: qty 1

## 2013-06-30 MED ORDER — SODIUM CHLORIDE 0.9 % IV SOLN
Freq: Once | INTRAVENOUS | Status: AC
  Administered 2013-06-30: 14:00:00 via INTRAVENOUS

## 2013-06-30 MED ORDER — FAMOTIDINE IN NACL 20-0.9 MG/50ML-% IV SOLN
INTRAVENOUS | Status: AC
  Filled 2013-06-30: qty 50

## 2013-06-30 MED ORDER — SODIUM CHLORIDE 0.9 % IJ SOLN
10.0000 mL | INTRAMUSCULAR | Status: DC | PRN
  Administered 2013-06-30: 10 mL
  Filled 2013-06-30: qty 10

## 2013-06-30 MED ORDER — DIPHENHYDRAMINE HCL 50 MG/ML IJ SOLN
50.0000 mg | Freq: Once | INTRAMUSCULAR | Status: AC
  Administered 2013-06-30: 50 mg via INTRAVENOUS

## 2013-06-30 MED ORDER — HEPARIN SOD (PORK) LOCK FLUSH 100 UNIT/ML IV SOLN
500.0000 [IU] | Freq: Once | INTRAVENOUS | Status: AC | PRN
  Administered 2013-06-30: 500 [IU]
  Filled 2013-06-30: qty 5

## 2013-06-30 MED ORDER — PACLITAXEL CHEMO INJECTION 300 MG/50ML
80.0000 mg/m2 | Freq: Once | INTRAVENOUS | Status: AC
  Administered 2013-06-30: 138 mg via INTRAVENOUS
  Filled 2013-06-30: qty 23

## 2013-06-30 MED ORDER — ONDANSETRON 16 MG/50ML IVPB (CHCC)
16.0000 mg | Freq: Once | INTRAVENOUS | Status: AC
  Administered 2013-06-30: 16 mg via INTRAVENOUS

## 2013-06-30 MED ORDER — SODIUM CHLORIDE 0.9 % IV SOLN
200.0000 mg | Freq: Once | INTRAVENOUS | Status: AC
  Administered 2013-06-30: 200 mg via INTRAVENOUS
  Filled 2013-06-30: qty 20

## 2013-06-30 MED ORDER — FAMOTIDINE IN NACL 20-0.9 MG/50ML-% IV SOLN
20.0000 mg | Freq: Once | INTRAVENOUS | Status: AC
  Administered 2013-06-30: 20 mg via INTRAVENOUS

## 2013-06-30 MED ORDER — ONDANSETRON 16 MG/50ML IVPB (CHCC)
INTRAVENOUS | Status: AC
  Filled 2013-06-30: qty 16

## 2013-06-30 MED ORDER — DEXAMETHASONE SODIUM PHOSPHATE 20 MG/5ML IJ SOLN
INTRAMUSCULAR | Status: AC
  Filled 2013-06-30: qty 5

## 2013-06-30 MED ORDER — DEXAMETHASONE SODIUM PHOSPHATE 20 MG/5ML IJ SOLN
20.0000 mg | Freq: Once | INTRAMUSCULAR | Status: AC
  Administered 2013-06-30: 20 mg via INTRAVENOUS

## 2013-06-30 NOTE — Progress Notes (Signed)
ID: Tabitha Ramirez OB: [birth year]  MR#: [id number]  CSN#:[id number]  PCP: Tabitha Niece, MD GYN:  Tabitha Ramirez SU: Tabitha Ramirez OTHER MD: Tabitha Ramirez  CHIEF COMPLAINT:  Right Breast Cancer   HISTORY OF PRESENT ILLNESS: Tabitha Ramirez tells me she felt a mass in her right breast about 3 months ago but initially ignored it. She eventually brought it to her physician's attention and was set up for bilateral diagnostic mammography at the breast Center 01/31/2013. This showed an area of distortion in the upper outer quadrant measuring approximately 4.3 cm. Diffuse calcifications in both breasts were stable. The mass was palpable at 12:00, 3 cm from the nipple, and by ultrasound it was irregular, hypoechoic, and measured 2.5 cm. In addition there was prominent right axillary adenopathy the largest lymph node measuring 2.5 cm.  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 Biopsy of the lymph node was negative. It did show some lymph node tissue.  Bilateral breast MRI 02/10/2013 showed a ring-enhancing necrotic-appearing 3.4 cm mass in the anterior third of the right breast. There was also a 2.0 cm level one right axillary lymph node that was prominent, but maintains central fat. The left breast the left axilla were benign and there was no internal mammary adenopathy noted.  The patient's subsequent history is as detailed below.  INTERVAL HISTORY: Tabitha Ramirez returns alone today for followup of her locally advanced right breast carcinoma. She is due for her sixth of 12 planned weekly doses of carbo/paclitaxel today. She continues to tolerate treatment well, and has few complaints today.  Tabitha Ramirez continues to have some mild pain in the right elbow, although she feels it is slightly improved. She still has some blood in the mucus when she blows her nose and has a runny nose she  attributes to seasonal allergies. She's been using saline nasal spray and this has been helpful. She's had no additional signs of abnormal bleeding elsewhere.   REVIEW OF SYSTEMS: Tabitha Ramirez has had no illnesses and denies fevers or chills. Her energy level is fair, and she still has some problems sleeping at times. She has hyperpigmentation of the nailbeds bilaterally, both the upper and lower stream and these. They're not particularly sore to touch, and she denies any drainage, odor, or signs of infection. She's had no additional skin changes or rashes.  She's had no mouth ulcers, oral sensitivity, or problems swallowing. Her appetite is good, and she is drinking plenty of water to keep herself well hydrated.  She's had no nausea or emesis and is having regular bowel movements.  She's had no cough, phlegm production, increased shortness of breath, or chest pain. She denies any abnormal headaches, change in vision or dizziness. She denies any additional myalgias, arthralgias, or bony pain. She currently denies any signs of numbness or tingling in either the upper or lower extremities.  A detailed review of systems is otherwise noncontributory.    PAST MEDICAL HISTORY: Past Medical History  Diagnosis Date  . Heart murmur   . Hypertension   . Anemia   . Allergy   . Arthritis   . Breast cancer   . Migraines     PAST SURGICAL HISTORY: Past Surgical History  Procedure Laterality Date  . Abdominal hysterectomy      With bilateral salpingo-oophorectomy  . Portacath placement N/A 03/10/2013    Procedure: INSERTION PORT-A-CATH WITH FLUOROSCOPY AND  ULTRASOUND;  Surgeon: Tabitha Mention, MD;  Location[facility];  Service: General;  Laterality: N/A;  . Axillary lymph node biopsy Right 03/10/2013    Procedure: AXILLARY LYMPH NODE BIOPSY;  Surgeon: Tabitha Mention, MD;  Location: [facility] OR;  Service: General;  Laterality: Right;  sentinel node with blue dye  . Esophagogastroduodenoscopy N/A 04/28/2013     Procedure: ESOPHAGOGASTRODUODENOSCOPY (EGD);  Surgeon: Tabitha Shiver, MD;  Location: [facility];  Service: Endoscopy;  Laterality: N/A;  . Colonoscopy N/A 04/30/2013    Procedure: COLONOSCOPY;  Surgeon: Tabitha Shiver, MD;  Location: [facility] ENDOSCOPY;  Service: Endoscopy;  Laterality: N/A;    FAMILY HISTORY Family History  Problem Relation Age of Onset  . Cancer Mother 46    Unknown type of cancer  . Congestive Heart Failure Mother   . Colon cancer Brother   . Diabetes Brother 7  . Heart disease Brother     AMI 05/22/2012  . Congestive Heart Failure Brother   . Cancer Maternal Grandmother 94    Unknown type of cancer   the patient's father died in an automobile accident at the age of 59. The patient's mother lived to be 60. Tabitha Ramirez has 3 brothers, no sisters. One brother had colon cancer at the age of 80. The patient's mother and the patient's mothers mother (maternal grandmother) both were diagnosed with some kind of cancer at the age of 62 or thereabouts, but Tabitha Ramirez does not know what type of cancer this may have been.  GYNECOLOGIC HISTORY:  Menarche age 58, first live birth age 75. The patient had her hysterectomy in the 1970s. She has been on estrogen replacement for she thinks 18 years.  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 She is divorced, lives by herself, with no pets. Her first child was given up for adoption. Her second child, Tabitha Ramirez, is a Geophysicist/field seismologist and is working towards a PhD at [employer]. Tabitha Ramirez has no grandchildren. She attends new [employer]    ADVANCED DIRECTIVES: In place. The patient has named her daughter Tabitha Ramirez as her healthcare power of attorney. Tabitha Ramirez can be reached at [phone]. The healthcare power of attorney documents were brought by the patient 03/13/2013 and are separately scanned.   HEALTH  MAINTENANCE: (Updated 06/30/2013) History  Substance Use Topics  . Smoking status: Former Smoker    Types: Cigarettes  . Smokeless tobacco: Never Used     Comment: smoked in college 1 yr /occ wine  . Alcohol Use: No     Colonoscopy: August 2012  PAP:  Bone density: Never  Lipid panel: Dr. Jackelyn Knife  Allergies  Allergen Reactions  . Penicillins Rash    Current Outpatient Prescriptions  Medication Sig Dispense Refill  . Acetaminophen (TYLENOL EXTRA STRENGTH PO) Take 1 tablet by mouth as needed.      . lidocaine-prilocaine (EMLA) cream Apply 1 application topically as needed (port access). Apply to port area 1-2 hours before chemotherapy; cover with plastioc wrap      . LORazepam (ATIVAN) 0.5 MG tablet Take 1 tablet (0.5 mg total) by mouth at bedtime as needed for anxiety.  20 tablet  0  . Multiple Vitamin (MULTIVITAMIN WITH MINERALS) TABS Take 1 tablet by mouth daily.      Marland Kitchen nystatin cream (MYCOSTATIN) Apply topically 2 (two) times daily.      . ondansetron (ZOFRAN)  8 MG tablet Take 1 tablet (8 mg total) by mouth every 12 (twelve) hours as needed for nausea.  20 tablet  3  . pantoprazole (PROTONIX) 40 MG tablet Take 1 tablet (40 mg total) by mouth daily at 12 noon.  30 tablet  2  . prochlorperazine (COMPAZINE) 10 MG tablet Take 10 mg by mouth every 6 (six) hours as needed (nausea/anxiety).      . traMADol (ULTRAM) 50 MG tablet Take 1 tablet (50 mg total) by mouth every 6 (six) hours as needed for pain.  60 tablet  0  . triamterene-hydrochlorothiazide (MAXZIDE-25) 37.5-25 MG per tablet Take 1 tablet by mouth daily.  30 tablet  3   No current facility-administered medications for this visit.    OBJECTIVE: Middle-aged [place] American woman in no acute distress Filed Vitals:   06/30/13 1252  BP: 121/78  Pulse: 100  Temp: 97.9 F (36.6 C)  Resp: 18     Body mass index is 26.28 kg/(m^2).    ECOG FS: 1 Filed Weights   06/30/13 1252  Weight: 153 lb 3 oz (69.485 kg)   Physical  Exam: HEENT:  Sclerae anicteric.  Oropharynx clear. No ulcerations or evidence of candidiasis. Tongue is hyperpigmented.  NODES:  No cervical or supraclavicular lymphadenopathy palpated.  BREAST EXAM:  Deferred. There is a small palpable abnormality in the right axilla, consistent with known seroma just inferior to the axillary incision.  This seems slightly smaller than with previous exam.  Left axilla is benign with no palpable adenopathy. LUNGS:  Clear to auscultation bilaterally.  No wheezes or rhonchi HEART:  Regular rate and rhythm. No murmur appreciated. ABDOMEN:  Soft, nontender.  Positive bowel sounds.  MSK:  No focal spinal tenderness to palpation. Full range of motion in the upper extremities, including the right arm and right elbow. EXTREMITIES:  No peripheral edema.   SKIN:  Port is intact in the left upper chest wall with no erythema, edema, or evidence of infection. Nailbeds are hyperpigmented bilaterally in the upper extremities, but with no drainage or evidence of infection noted. NEURO:  Nonfocal. Well oriented.  Friendly affect.   LAB RESULTS:   Lab Results  Component Value Date   WBC 2.8* 06/30/2013   NEUTROABS 1.5 06/30/2013   HGB 11.5* 06/30/2013   HCT 33.3* 06/30/2013   MCV 93.5 06/30/2013   PLT 127* 06/30/2013      Chemistry      Component Value Date/Time   NA 139 06/16/2013 1054   NA 141 04/30/2013 0530   K 3.5 06/16/2013 1054   K 3.2* 04/30/2013 0530   CL 106 04/30/2013 0530   CL 100 03/13/2013 0918   CO2 26 06/16/2013 1054   CO2 26 04/30/2013 0530   BUN 13.0 06/16/2013 1054   BUN 5* 04/30/2013 0530   CREATININE 0.8 06/16/2013 1054   CREATININE 0.75 04/30/2013 0530   CREATININE 0.94 11/29/2011 1535      Component Value Date/Time   CALCIUM 9.5 06/16/2013 1054   CALCIUM 8.7 04/30/2013 0530   ALKPHOS 95 06/16/2013 1054   ALKPHOS 96 02/28/2013 0937   AST 20 06/16/2013 1054   AST 27 02/28/2013 0937   ALT 24 06/16/2013 1054   ALT 27 02/28/2013 0937   BILITOT 0.30 06/16/2013 1054    BILITOT 0.3 02/28/2013 0937       STUDIES: No results found.    ASSESSMENT: 67 y.o. [place] woman   (1)  status post right central breast biopsy 01/31/2013 for a  clinical T2 NX, stage II invasive ductal carcinoma, grade 3, triple negative, with an MIB-1 of 84%  (2) biopsy of an enlarged (2.5 cm) right axillary lymph node 01/31/2013 was negative, felt possibly discordant  (3) sentinel lymph node sampling 03/10/2013 showed one of two sentinel nodes to be positive; repeat prognostic profile pending  (4) neoadjuvant doxorubicin and cyclophosphamide being given in dose dense fashion with Neulasta support starting 03/17/2013, followed by weekly carboplatin and paclitaxel x12, with first cycle on 05/27/2013.  (5)  status post hospitalization in early August 2014 secondary to a significant drop in hemoglobin leading to syncope and loss of consciousness.  (6)  Hx of symptomatic anemia, likely chemo-induced.  Status post blood transfusion, 2 units of packed red blood cells, on 06/10/2013. Now stable.  (7) Right elbow pain, slightly improved  (8)  Mild epistaxis, improved  (9)  Jaw Pain, resolved  (10)  chemotherapy-induced thrombocytopenia, asymptomatic   PLAN: Tabitha Ramirez will proceed to treatment today as scheduled for her sixth of 12 planned weekly doses of carboplatin/paclitaxel in the neoadjuvant setting. We'll continue to follow her closely on weekly basis, and she is scheduled to see Norina Buzzard NP-C next week on October 13 for labs and physical exam prior to her seventh weekly dose of chemotherapy. We'll follow her labs closely, especially her mild thrombocytopenia which is likely associated with the carboplatin. We'll also follow her clinically for any signs of peripheral neuropathy, which at this point has not been a problem.  If all goes as planned, Tabitha Ramirez will be receiving her 12th and final dose of carboplatin/paclitaxel on November 17. This will be followed by a repeat breast MRI,  and she will see Dr. Darnelle Catalan on November 25 for a final followup prior to definitive surgery.   Tabitha Ramirez will continue using tramadol as needed for her elbow pain which is slightly better. She also continue using saline nasal spray for mild epistasis, also improved. Tabitha Ramirez voices understanding and agreement with our plan thus far, and will call with any changes or problems prior to her next scheduled appointment.   Bernarda Erck, PA-C   06/30/2013 1:34 PM

## 2013-06-30 NOTE — Patient Instructions (Addendum)
Cancer Center Discharge Instructions for Patients Receiving Chemotherapy  Today you received the following chemotherapy agents: Taxol, Carboplatin  To help prevent nausea and vomiting after your treatment, we encourage you to take your nausea medication as prescribed.    If you develop nausea and vomiting that is not controlled by your nausea medication, call the clinic.   BELOW ARE SYMPTOMS THAT SHOULD BE REPORTED IMMEDIATELY:  *FEVER GREATER THAN 100.5 F  *CHILLS WITH OR WITHOUT FEVER  NAUSEA AND VOMITING THAT IS NOT CONTROLLED WITH YOUR NAUSEA MEDICATION  *UNUSUAL SHORTNESS OF BREATH  *UNUSUAL BRUISING OR BLEEDING  TENDERNESS IN MOUTH AND THROAT WITH OR WITHOUT PRESENCE OF ULCERS  *URINARY PROBLEMS  *BOWEL PROBLEMS  UNUSUAL RASH Items with * indicate a potential emergency and should be followed up as soon as possible.  Feel free to call the clinic you have any questions or concerns. The clinic phone number is [phone].    

## 2013-06-30 NOTE — Progress Notes (Signed)
OK to treat with plt 127 today per Zollie Scale, PA

## 2013-06-30 NOTE — Telephone Encounter (Signed)
appts made and printed...td 

## 2013-07-01 ENCOUNTER — Encounter: Payer: Self-pay | Admitting: Oncology

## 2013-07-07 ENCOUNTER — Ambulatory Visit (HOSPITAL_BASED_OUTPATIENT_CLINIC_OR_DEPARTMENT_OTHER): Payer: Medicare Other | Admitting: Family

## 2013-07-07 ENCOUNTER — Telehealth: Payer: Self-pay | Admitting: Oncology

## 2013-07-07 ENCOUNTER — Ambulatory Visit: Payer: Medicare Other

## 2013-07-07 ENCOUNTER — Encounter: Payer: Self-pay | Admitting: Family

## 2013-07-07 ENCOUNTER — Other Ambulatory Visit (HOSPITAL_BASED_OUTPATIENT_CLINIC_OR_DEPARTMENT_OTHER): Payer: Medicare Other | Admitting: Lab

## 2013-07-07 VITALS — BP 106/69 | HR 91 | Temp 98.5°F | Resp 18 | Ht 64.0 in | Wt 155.1 lb

## 2013-07-07 DIAGNOSIS — G609 Hereditary and idiopathic neuropathy, unspecified: Secondary | ICD-10-CM

## 2013-07-07 DIAGNOSIS — C50111 Malignant neoplasm of central portion of right female breast: Secondary | ICD-10-CM

## 2013-07-07 DIAGNOSIS — D702 Other drug-induced agranulocytosis: Secondary | ICD-10-CM

## 2013-07-07 DIAGNOSIS — Z171 Estrogen receptor negative status [ER-]: Secondary | ICD-10-CM

## 2013-07-07 DIAGNOSIS — D649 Anemia, unspecified: Secondary | ICD-10-CM

## 2013-07-07 DIAGNOSIS — C50119 Malignant neoplasm of central portion of unspecified female breast: Secondary | ICD-10-CM

## 2013-07-07 LAB — CBC WITH DIFFERENTIAL/PLATELET
BASO%: 2.7 % — ABNORMAL HIGH (ref 0.0–2.0)
EOS%: 0 % (ref 0.0–7.0)
Eosinophils Absolute: 0 10*3/uL (ref 0.0–0.5)
HGB: 9.7 g/dL — ABNORMAL LOW (ref 11.6–15.9)
LYMPH%: 54.7 % — ABNORMAL HIGH (ref 14.0–49.7)
MCH: 32.3 pg (ref 25.1–34.0)
MCHC: 34.3 g/dL (ref 31.5–36.0)
MCV: 94.3 fL (ref 79.5–101.0)
MONO%: 16.9 % — ABNORMAL HIGH (ref 0.0–14.0)
NEUT#: 0.4 10*3/uL — CL (ref 1.5–6.5)
RBC: 3 10*6/uL — ABNORMAL LOW (ref 3.70–5.45)
RDW: 18 % — ABNORMAL HIGH (ref 11.2–14.5)

## 2013-07-07 MED ORDER — CIPROFLOXACIN HCL 500 MG PO TABS: 500.0000 mg | ORAL_TABLET | Freq: Two times a day (BID) | ORAL | Status: DC

## 2013-07-07 NOTE — Patient Instructions (Addendum)
Please contact us at [phone] if you have any questions or concerns.  Get plenty of rest, drink plenty of water, exercise daily (walking/chair exercises/biking), eat a balanced diet.  Continue to take your multvitamin daily.  Use Loratadine 10 mg daily for allergies.  Neutropenic Precautions:   (Compromised immune system)  Dietary - It is recommended that the following foods should be avoided (neutropenic diet guideline); Raw fresh fruits, meats and vegetables  Uncooked peppers  Raw or unpasteurized dairy products  Deli foods  Raw or partially cooked egg  Neutropenic precautions - Other tips to help avoid infection Take temperature if feeling unwell or experiencing chills  Avoid crowded areas eg shopping centres and movie theaters  Avoid construction areas. These areas contains could contain fungus in the air that could be harmful to the patient  Avoid cuts, punctures or scratches on your skin  Avoid anyone who has received a vaccination within the last three weeks  Daily shower or baths  Drink lots of fluids (water, juice, etc.) to prevent dehydration  Limit exposure to pet feces e.g. litter box  Maintain a well-balanced diet  Regular gentle mouth care using a soft tooth brush  Rinse mouth with warm salt water, prescription mouth wash or baking soda and water. Commercial mouth wash contains alcohol that can dry the mouth  Women should wipe front to back after a bowel movement  Women should use pads rather than tampons Avoid fresh fruit FREQUENT HANDWASHING  Possible Symptoms of infection Temperature greater than 38 degrees Celsius (or whatever temperature the physican advises)  - Call us for any temperature over 100 degrees Farenheit Burning or pain when urinating or a cloudy urine  Difficulty swallowing  Dizziness or weakness  Redness, swelling or tenderness  Shortness of breath  Sores in the mouth  Sore throat  Stuffy/runny nose and/or coughing  Results for  orders placed in visit on 07/07/13 (from the past 72 hour(s))  CBC WITH DIFFERENTIAL     Status: Abnormal   Collection Time    07/07/13  8:02 AM      Result Value Range   WBC 1.5 (*) 3.9 - 10.3 10e3/uL   NEUT# 0.4 (*) 1.5 - 6.5 10e3/uL   HGB 9.7 (*) 11.6 - 15.9 g/dL   HCT 11.9 (*) 14.7 - 82.9 %   Platelets 161  145 - 400 10e3/uL   MCV 94.3  79.5 - 101.0 fL   MCH 32.3  25.1 - 34.0 pg   MCHC 34.3  31.5 - 36.0 g/dL   RBC 5.62 (*) 1.30 - 8.65 10e6/uL   RDW 18.0 (*) 11.2 - 14.5 %   lymph# 0.8 (*) 0.9 - 3.3 10e3/uL   MONO# 0.3  0.1 - 0.9 10e3/uL   Eosinophils Absolute 0.0  0.0 - 0.5 10e3/uL   Basophils Absolute 0.0  0.0 - 0.1 10e3/uL   NEUT% 25.7 (*) 38.4 - 76.8 %   LYMPH% 54.7 (*) 14.0 - 49.7 %   MONO% 16.9 (*) 0.0 - 14.0 %   EOS% 0.0  0.0 - 7.0 %   BASO% 2.7 (*) 0.0 - 2.0 %

## 2013-07-07 NOTE — Progress Notes (Signed)
[facility]  [phone] [phone]  OFFICE PROGRESS NOTE    ID: Tabitha Ramirez OB: 22-Mar-1946  MR#: [id number]  MWU#:[id number]   PCP: Zenaida Niece, MD GYN:  Lavina Hamman, M.D. SU: Claud Kelp, M.D. RAD ONC: Lurline Hare, M.D.   CHIEF COMPLAINT:  Right Breast Cancer   HISTORY OF PRESENT ILLNESS: Tabitha Ramirez felt a mass in her right breast about 3 months ago but initially ignored it. She eventually brought it to her physician's attention and was set up for bilateral diagnostic mammography at [facility] on 01/31/2013. This showed an area of distortion in the upper outer quadrant measuring approximately 4.3 cm. Diffuse calcifications in both breasts were stable. The mass was palpable at 12 o'clock, 3 cm from the nipple, and by ultrasound it was irregular, hypoechoic, and measured 2.5 cm. In addition there was prominent right axillary adenopathy the largest lymph node measuring 2.5 cm.  Biopsy of the right breast mass and larger lymph node on 01/31/2013 showed ([id number]) in the breast, and invasive ductal carcinoma, grade 3, triple negative, with an MIB-1 of 84%. Biopsy of the lymph node was negative. It did show some lymph node tissue.  Bilateral breast MRI on 02/10/2013 showed a ring-enhancing necrotic-appearing 3.4 cm mass in the anterior third of the right breast. There was also a 2.0 cm level one right axillary lymph node that was prominent, but maintains central fat. The left breast the left axilla were benign and there was no internal mammary adenopathy noted.  Her subsequent history is as detailed below.   INTERVAL HISTORY: Tabitha Ramirez returns today for followup of her locally advanced right breast carcinoma. She is due for her seventh of 12 planned weekly doses of Carbo/Paclitaxel today. She continues to tolerate treatment well, and has few complaints today.    REVIEW OF SYSTEMS: A 10 point review of systems was completed and Tabitha Ramirez's  pain in the right elbow has resolved. She continues to have some blood in the mucus when she blows her nose and has a runny nose she attributes to seasonal allergies.  We discussed taking Loratadine daily during this fall allergy season.  She's been using saline nasal spray and this has been helpful. She's had no additional signs of abnormal bleeding elsewhere.  She has complains of bilateral thumb numbness and bilateral numbness in her feet.  She states that she has not fallen or stumbled and has not dropped any objects.  She states she had a left ear ache last week that spontaneously resolved.  She continues to have intermittent jaw pain that is relieved by Tramadol.  We discussed her nighttime teeth grinding and obtaining a bite guard for nighttime use.  Tabitha Ramirez has had no illnesses and denies fevers or chills. Her energy level is fair, but her insomnia has improved. She has hyperpigmentation of the nailbeds bilaterally, both the upper and lower extremities. They're not particularly sore to touch, and she denies any drainage, odor, or signs of infection. She's had no additional skin changes or rashes.  She's had no mouth ulcers, oral sensitivity, or problems swallowing. Her appetite is good, and she is drinking plenty of water to keep herself well hydrated.  She is experiencing dysgeusia/alteration of taste. She's had no nausea or emesis and is having regular bowel movements.  She's had no cough, phlegm production, increased shortness of breath, or chest pain. She denies any abnormal headaches, change in vision or dizziness. She denies any additional myalgias, arthralgias, or bony pain.  A detailed review of systems is otherwise noncontributory.   PAST MEDICAL HISTORY: Past Medical History  Diagnosis Date  . Heart murmur   . Hypertension   . Anemia   . Allergy   . Arthritis   . Breast cancer   . Migraines     PAST SURGICAL HISTORY: Past Surgical History  Procedure Laterality Date  . Abdominal  hysterectomy      With bilateral salpingo-oophorectomy  . Portacath placement N/A 03/10/2013    Procedure: INSERTION PORT-A-CATH WITH FLUOROSCOPY AND ULTRASOUND;  Surgeon: Ernestene Mention, MD;  Location: [facility] OR;  Service: General;  Laterality: N/A;  . Axillary lymph node biopsy Right 03/10/2013    Procedure: AXILLARY LYMPH NODE BIOPSY;  Surgeon: Ernestene Mention, MD;  Location: [facility] OR;  Service: General;  Laterality: Right;  sentinel node with blue dye  . Esophagogastroduodenoscopy N/A 04/28/2013    Procedure: ESOPHAGOGASTRODUODENOSCOPY (EGD);  Surgeon: Graylin Shiver, MD;  Location: [facility];  Service: Endoscopy;  Laterality: N/A;  . Colonoscopy N/A 04/30/2013    Procedure: COLONOSCOPY;  Surgeon: Graylin Shiver, MD;  Location: [facility] ENDOSCOPY;  Service: Endoscopy;  Laterality: N/A;    FAMILY HISTORY Family History  Problem Relation Age of Onset  . Cancer Mother 54    Unknown type of cancer  . Congestive Heart Failure Mother   . Colon cancer Brother   . Diabetes Brother 68  . Heart disease Brother     AMI 05/22/2012  . Congestive Heart Failure Brother   . Cancer Maternal Grandmother 22    Unknown type of cancer   The patient's father died in an automobile accident at the age of 47. The patient's mother lived to be 53. Tabitha Ramirez has 3 brothers, no sisters. One brother had colon cancer at the age of 17. The patient's mother and the patient's mothers mother (maternal grandmother) both were diagnosed with some kind of cancer at the age of 62 or thereabouts, but Lavilla does not know what type of cancer this may have been.   GYNECOLOGIC HISTORY:  Menarche age 47, first live birth age 26. The patient had her hysterectomy in the 1970s. She has been on estrogen replacement for she thinks 18 years.   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 She is divorced, lives by herself, with no pets. Her first child  was given up for adoption. Her second child, [name], is a Geophysicist/field seismologist and is working towards a PhD at [employer]. Shakevia has no grandchildren. She attends new [employer]    ADVANCED DIRECTIVES: In place. The patient has named her daughter Tabitha Ramirez as her healthcare power of attorney. Tabitha Ramirez can be reached at [phone]. The healthcare power of attorney documents were brought by the patient 03/13/2013 and are separately scanned.   HEALTH MAINTENANCE: (Updated 06/30/2013) History  Substance Use Topics  . Smoking status: Former Smoker    Types: Cigarettes  . Smokeless tobacco: Never Used     Comment: smoked in college 1 yr   . Alcohol Use: No     Comment: Wine occasionally     Colonoscopy: August 2012  PAP: Not on file  Bone density: Never  Lipid panel: Dr. Jackelyn Knife  Allergies  Allergen Reactions  . Penicillins Rash    Current Outpatient Prescriptions  Medication Sig Dispense Refill  . Acetaminophen (TYLENOL EXTRA STRENGTH PO) Take 1 tablet  by mouth as needed.      . lidocaine-prilocaine (EMLA) cream Apply 1 application topically as needed (port access). Apply to port area 1-2 hours before chemotherapy; cover with plastioc wrap      . LORazepam (ATIVAN) 0.5 MG tablet Take 1 tablet (0.5 mg total) by mouth at bedtime as needed for anxiety.  20 tablet  0  . Multiple Vitamin (MULTIVITAMIN WITH MINERALS) TABS Take 1 tablet by mouth daily.      Marland Kitchen nystatin cream (MYCOSTATIN) Apply topically 2 (two) times daily.      . ondansetron (ZOFRAN) 8 MG tablet Take 1 tablet (8 mg total) by mouth every 12 (twelve) hours as needed for nausea.  20 tablet  3  . pantoprazole (PROTONIX) 40 MG tablet Take 1 tablet (40 mg total) by mouth daily at 12 noon.  30 tablet  2  . prochlorperazine (COMPAZINE) 10 MG tablet Take 10 mg by mouth every 6 (six) hours as needed (nausea/anxiety).      . traMADol (ULTRAM) 50 MG tablet Take 1 tablet (50 mg  total) by mouth every 6 (six) hours as needed for pain.  60 tablet  0  . triamterene-hydrochlorothiazide (MAXZIDE-25) 37.5-25 MG per tablet Take 1 tablet by mouth daily.  30 tablet  3  . ciprofloxacin (CIPRO) 500 MG tablet Take 1 tablet (500 mg total) by mouth 2 (two) times daily.  14 tablet  2   No current facility-administered medications for this visit.    OBJECTIVE: Middle-aged [place] American woman in no acute distress Filed Vitals:   07/07/13 0823  BP: 106/69  Pulse: 91  Temp: 98.5 F (36.9 C)  Resp: 18     Body mass index is 26.61 kg/(m^2).    ECOG FS: 1 - Symptomatic but completely ambulatory  Filed Weights   07/07/13 0823  Weight: 155 lb 1.6 oz (70.353 kg)     PHYSICAL EXAM: General appearance: Alert, cooperative, well nourished, no apparent distress  Head: Normocephalic, without obvious abnormality, atraumatic, oromucosa and tongue are hyperpigmented, no oral candidiasis  Eyes: Arcus senilis, PERRLA, EOMI  Nose: Nares, septum and mucosa are normal, no drainage or sinus tenderness  Neck: No adenopathy, supple, symmetrical, trachea midline, no tenderness  Resp: Clear to auscultation bilaterally, no wheezes/rales/rhonchi  Cardio: Regular rate and rhythm, S1, S2 normal, no murmur, click, rub or gallop, no edema, left chest Port-A-Cath covered in EMLA cream and an occlusive dressing  Breasts: Deferred  GI: Soft, distended, non-tender, hypoactive bowel sounds, no organomegaly  Skin: No rashes/lesions, skin warm and dry, no erythematous areas, no cyanosis, hyperpigmented nail beds upper and lower extremities M/S: Atraumatic, normal strength in all extremities, normal range of motion, no clubbing, bilateral thumb tenderness when palpated  Lymph nodes: Cervical and supraclavicular lymph nodes are normal  Neurologic: Grossly normal, cranial nerves II through XII intact, alert and oriented x 3  Psych: Appropriate affect   LAB RESULTS: Lab Results  Component Value Date   WBC  1.5* 07/07/2013   NEUTROABS 0.4* 07/07/2013   HGB 9.7* 07/07/2013   HCT 28.3* 07/07/2013   MCV 94.3 07/07/2013   PLT 161 07/07/2013      Chemistry      Component Value Date/Time   NA 140 06/30/2013 1241   NA 141 04/30/2013 0530   K 3.3* 06/30/2013 1241   K 3.2* 04/30/2013 0530   CL 106 04/30/2013 0530   CL 100 03/13/2013 0918   CO2 27 06/30/2013 1241   CO2 26 04/30/2013 0530  BUN 15.8 06/30/2013 1241   BUN 5* 04/30/2013 0530   CREATININE 0.9 06/30/2013 1241   CREATININE 0.75 04/30/2013 0530   CREATININE 0.94 11/29/2011 1535      Component Value Date/Time   CALCIUM 9.6 06/30/2013 1241   CALCIUM 8.7 04/30/2013 0530   ALKPHOS 77 06/30/2013 1241   ALKPHOS 96 02/28/2013 0937   AST 25 06/30/2013 1241   AST 27 02/28/2013 0937   ALT 30 06/30/2013 1241   ALT 27 02/28/2013 0937   BILITOT 0.38 06/30/2013 1241   BILITOT 0.3 02/28/2013 0937       STUDIES: No results found.    ASSESSMENT: Tabitha Ramirez is a 67 y.o. [place], [place] woman:  (1) Status post right central breast biopsy 01/31/2013 for a clinical T2 NX, stage II invasive ductal carcinoma, grade 3, triple negative, with an MIB-1 of 84%.   (2) Biopsy of an enlarged (2.5 cm) right axillary lymph node 01/31/2013 was negative, felt possibly discordant.   (3) Sentinel lymph node sampling 03/10/2013 showed one of two sentinel nodes to be positive; estrogen receptor 3% positive, progesterone receptor 6% positive, Ki-67 50%, HER-2/neu by CISH no amplification with a ratio of 1.16.   (4) Neoadjuvant chemotherapy with doxorubicin and cyclophosphamide being given in dose dense fashion with Neulasta support starting 03/17/2013, followed by weekly carboplatin and paclitaxel x 12, with first cycle on 05/27/2013.   (5) Status post hospitalization in early 04/2013 secondary to a significant drop in hemoglobin leading to syncope and loss of consciousness.   (6) Symptomatic anemia, likely chemo-induced. Her last blood transfusion was on 06/10/2013.     (7) Right elbow pain, resolved  (8) Mild epistaxis (bleeding limited to the inner mucosa of nares bilaterally)   (9) Jaw pain  (10) Chemotherapy-induced neutropenia, asymptomatic  (11) Peripheral neuropathy   PLAN: Tabitha Ramirez's cycle #7 of 12 planned weekly doses of carboplatin/paclitaxel in the neoadjuvant setting will be held today secondary to chemotherapy-induced neutropenia.  We went over neutropenia in detail and reviewed neutropenic precautions.  She was also provided written instructions regarding neutropenic precautions.  An electronic prescription for ciprofloxacin 500 mg by mouth twice a day x 7 days #14 with 2 refills was sent to the patient's pharmacy.  We will continue to follow her laboratory values closely on weekly basis.  We will also follow her clinically for recent development of peripheral neuropathy.  Dr. Darnelle Catalan would like to hold off prescribing any medication for peripheral neuropathy at this time since she will not be receiving chemotherapy today and see if neuropathy resolves.  Tabitha Ramirez will continue using Tramadol as needed for pain. She also continue using saline nasal spray for mild epistasis.    We plan to see Tabitha Ramirez again on 07/14/2013 for cycle #8 of 12 of neoadjuvant chemotherapy consisting of carboplatin/paclitaxel.  She is scheduled to see Dr. Derrell Lolling on 07/11/2013.  She is also scheduled for a breast MRI following neoadjuvant chemotherapy scheduled for 08/15/2013.  Tabitha Ramirez has been given a referral to speak with our registered dietitian Zenovia Jarred.  All questions answered.  Tabitha Ramirez was encouraged to contact us in the interim with any questions, concerns, or problems.  Larina Bras, NP-C 07/07/2013      1:38 PM

## 2013-07-07 NOTE — Telephone Encounter (Signed)
,  l

## 2013-07-08 ENCOUNTER — Telehealth: Payer: Self-pay | Admitting: Oncology

## 2013-07-08 ENCOUNTER — Telehealth: Payer: Self-pay | Admitting: *Deleted

## 2013-07-08 NOTE — Telephone Encounter (Signed)
Sent Tabitha Ramirez a staff message to add the pt chemo appts for nov.

## 2013-07-08 NOTE — Telephone Encounter (Signed)
Per staff message and POF I have scheduled appts.  JMW  

## 2013-07-11 ENCOUNTER — Ambulatory Visit (INDEPENDENT_AMBULATORY_CARE_PROVIDER_SITE_OTHER): Payer: Medicare Other | Admitting: General Surgery

## 2013-07-11 ENCOUNTER — Encounter (INDEPENDENT_AMBULATORY_CARE_PROVIDER_SITE_OTHER): Payer: Self-pay | Admitting: General Surgery

## 2013-07-11 VITALS — BP 122/64 | HR 92 | Temp 98.6°F | Resp 16 | Ht 64.0 in | Wt 155.8 lb

## 2013-07-11 DIAGNOSIS — C50111 Malignant neoplasm of central portion of right female breast: Secondary | ICD-10-CM

## 2013-07-11 DIAGNOSIS — C50119 Malignant neoplasm of central portion of unspecified female breast: Secondary | ICD-10-CM

## 2013-07-11 NOTE — Progress Notes (Signed)
Patient ID: Tabitha Ramirez, female   DOB: 06/13/1946, 67 y.o.   MRN: [id number]  History: Tabitha Ramirez is a 67 y.o. female. 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  On 03/10/2013 she underwent injection of blue dye, right axillary sentinel node biopsy and Port-A-Cath insertion. One out of 2 lymph nodes was positive for metastatic carcinoma. She has been receiving chemotherapy. She developed a profound anemia and had a syncopal episode and was hospitalized in transfused. She had upper endoscopy, colonoscopy, and CT angiogram of the chest which did not show any source of bleeding and no pulmonary embolus.  Mid treatment MRI was performed on 05/04/2013. This shows that the right breast mass has decreased from 3.4 cm to 2.5 cm. Small right pleural effusion small pericardial effusion were noted.  Review of cancer Center notes indicates that they plan to continue chemotherapy into the early part of December.  The patient is still hoping for a lumpectomy. I drew pictures of what that might look like, telling her that she might need a central lumpectomy with removal of the nipple and areola. This is based on the assumption of the tumor continues to respond.  She is reasonably stable now. She does have a little bit of neuropathy. She is on neutropenic precautions. She says she thinks the breast cancer as little bit smaller but she can still feel it.  ROS: 10 system review of systems is negative except as described above.  Exam: Patient is alert. In no distress. Wearing a mask for neutropenic precautions Neck: No adenopathy or mass Lungs: Clear to auscultation bilateral Breast palpable mass central retroareolar right breast. Skin is intact. No ulceration or drainage. This is still 3.5 cm transversely. I can't really feel any axillary adenopathy. No left breast mass noted. Heart: Regular  rate and rhythm no murmur. No ectopy.   Assessment: Locally advanced, triple negative invasive mammary carcinoma right breast, 12:00 central position, initially 4 cm, now apparently responding by MRI. Still quite noticeable on physical exam  The patient desires breast conservation, if her tumor to be downstage by neoadjuvant chemotherapy  Status post sentinel lymph node biopsy(positive),  Port-A-Cath insertion, uneventful recovery  Hypertension  Chemotherapy-induced anemia requiring transfusion  History TAH and BSO   Plan: Continue chemotherapy End of  treatment MRI in early December Return to see me in mid December. Discuss surgical management plan at that time. She may be a candidate for a lumpectomy, but this may or may not leave a significant defect depending on response to chemotherapy I will discuss with her her Port-A-Cath can be removed with Dr. Darnelle Catalan before that surgery.   Angelia Mould. Derrell Lolling, M.D., [facility] General and Minimally invasive Surgery Breast and Colorectal Surgery Office:   [phone] Pager:   [phone]

## 2013-07-11 NOTE — Patient Instructions (Signed)
Your right breast cancer does feel a little bit smaller. I can still feel it.  Hopefully, the continued chemotherapy will slowly shrink this down.  Return to see Dr. Derrell Lolling in December after you get your end of treatment MRI. We will discuss options for definitive surgery at that time.

## 2013-07-14 ENCOUNTER — Ambulatory Visit: Payer: Medicare Other | Admitting: Nutrition

## 2013-07-14 ENCOUNTER — Encounter: Payer: Self-pay | Admitting: Physician Assistant

## 2013-07-14 ENCOUNTER — Other Ambulatory Visit (HOSPITAL_BASED_OUTPATIENT_CLINIC_OR_DEPARTMENT_OTHER): Payer: Medicare Other

## 2013-07-14 ENCOUNTER — Telehealth: Payer: Self-pay | Admitting: *Deleted

## 2013-07-14 ENCOUNTER — Ambulatory Visit: Payer: Medicare Other

## 2013-07-14 ENCOUNTER — Ambulatory Visit (HOSPITAL_BASED_OUTPATIENT_CLINIC_OR_DEPARTMENT_OTHER): Payer: Medicare Other | Admitting: Physician Assistant

## 2013-07-14 VITALS — BP 138/84 | HR 80 | Temp 98.5°F | Resp 20 | Ht 64.0 in | Wt 155.2 lb

## 2013-07-14 DIAGNOSIS — C50111 Malignant neoplasm of central portion of right female breast: Secondary | ICD-10-CM

## 2013-07-14 DIAGNOSIS — D649 Anemia, unspecified: Secondary | ICD-10-CM

## 2013-07-14 DIAGNOSIS — Z171 Estrogen receptor negative status [ER-]: Secondary | ICD-10-CM

## 2013-07-14 DIAGNOSIS — D702 Other drug-induced agranulocytosis: Secondary | ICD-10-CM

## 2013-07-14 DIAGNOSIS — M25521 Pain in right elbow: Secondary | ICD-10-CM

## 2013-07-14 DIAGNOSIS — C50119 Malignant neoplasm of central portion of unspecified female breast: Secondary | ICD-10-CM

## 2013-07-14 DIAGNOSIS — G620 Drug-induced polyneuropathy: Secondary | ICD-10-CM

## 2013-07-14 DIAGNOSIS — C50919 Malignant neoplasm of unspecified site of unspecified female breast: Secondary | ICD-10-CM

## 2013-07-14 DIAGNOSIS — B370 Candidal stomatitis: Secondary | ICD-10-CM

## 2013-07-14 LAB — COMPREHENSIVE METABOLIC PANEL (CC13)
ALT: 27 U/L (ref 0–55)
Albumin: 3.5 g/dL (ref 3.5–5.0)
Anion Gap: 11 mEq/L (ref 3–11)
CO2: 25 mEq/L (ref 22–29)
Calcium: 9.7 mg/dL (ref 8.4–10.4)
Chloride: 104 mEq/L (ref 98–109)
Glucose: 128 mg/dl (ref 70–140)
Potassium: 3.6 mEq/L (ref 3.5–5.1)
Sodium: 141 mEq/L (ref 136–145)
Total Protein: 7.2 g/dL (ref 6.4–8.3)

## 2013-07-14 LAB — CBC WITH DIFFERENTIAL/PLATELET
BASO%: 0.5 % (ref 0.0–2.0)
Eosinophils Absolute: 0 10*3/uL (ref 0.0–0.5)
MCHC: 34.8 g/dL (ref 31.5–36.0)
MCV: 96.6 fL (ref 79.5–101.0)
MONO#: 1.1 10*3/uL — ABNORMAL HIGH (ref 0.1–0.9)
NEUT#: 0.1 10*3/uL — CL (ref 1.5–6.5)
RBC: 3.04 10*6/uL — ABNORMAL LOW (ref 3.70–5.45)
RDW: 21 % — ABNORMAL HIGH (ref 11.2–14.5)
WBC: 3.7 10*3/uL — ABNORMAL LOW (ref 3.9–10.3)
lymph#: 2.5 10*3/uL (ref 0.9–3.3)

## 2013-07-14 MED ORDER — FLUCONAZOLE 100 MG PO TABS: ORAL_TABLET | ORAL | Status: DC

## 2013-07-14 MED ORDER — CIPROFLOXACIN HCL 500 MG PO TABS: 500.0000 mg | ORAL_TABLET | Freq: Two times a day (BID) | ORAL | Status: DC

## 2013-07-14 MED ORDER — FILGRASTIM 300 MCG/0.5ML IJ SOLN
300.0000 ug | Freq: Once | INTRAMUSCULAR | Status: AC
  Administered 2013-07-14: 300 ug via SUBCUTANEOUS
  Filled 2013-07-14: qty 0.5

## 2013-07-14 NOTE — Telephone Encounter (Signed)
appts made and printed...td 

## 2013-07-14 NOTE — Progress Notes (Signed)
ID: [name] OB: 07/26/1946  MR#: [id number]  CSN#:[id number]  PCP: Zenaida Niece, MD GYN:  Lavina Hamman SU: Claud Kelp OTHER MD: Lurline Hare  CHIEF COMPLAINT:  Right Breast Cancer   HISTORY OF PRESENT ILLNESS: Jenicka tells me she felt a mass in her right breast about 3 months ago but initially ignored it. She eventually brought it to her physician's attention and was set up for bilateral diagnostic mammography at the [facility] Center 01/31/2013. This showed an area of distortion in the upper outer quadrant measuring approximately 4.3 cm. Diffuse calcifications in both breasts were stable. The mass was palpable at 12:00, 3 cm from the nipple, and by ultrasound it was irregular, hypoechoic, and measured 2.5 cm. In addition there was prominent right axillary adenopathy the largest lymph node measuring 2.5 cm.  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 Biopsy of the lymph node was negative. It did show some lymph node tissue.  Bilateral breast MRI 02/10/2013 showed a ring-enhancing necrotic-appearing 3.4 cm mass in the anterior third of the right breast. There was also a 2.0 cm level one right axillary lymph node that was prominent, but maintains central fat. The left breast the left axilla were benign and there was no internal mammary adenopathy noted.  The patient's subsequent history is as detailed below.  INTERVAL HISTORY: Jilian returns today accompanied by her daughter for followup of her locally advanced right breast carcinoma. She is due for her seventh of 12 planned weekly doses of carbo/paclitaxel today, with treatment having been held one week ago due to to chemotherapy-induced afebrile neutropenia. She was started on Cipro prophylactically, and denies any recent fevers or chills.   Interval history is also notable for the fact that Ora  has had increased neuropathy, primarily in the fingertips and the balls of her feet, over the past 7-10 days. This worsened after her sixth cycle of chemotherapy 2 weeks ago, and really has not improved since that time.  REVIEW OF SYSTEMS: Lakia is tired today. She denies any rashes or skin changes other than hyperpigmentation. Her nails are dark, but there is no evidence of infection. She's had no mouth ulcers, but her mouth is sensitive. She has Magic mouthwash on hand at home, and has used Diflucan in the past. She denies any problems with nausea and is having regular bowel movements. She's had no increased cough, phlegm production, shortness of breath, chest pain, or palpitations. She denies any abnormal headaches or dizziness. She currently denies any unusual myalgias, arthralgias, or bony pain, although she continues to have some occasional discomfort in the right elbow. She's had no peripheral swelling.  A detailed review of systems is otherwise stable and noncontributory.   PAST MEDICAL HISTORY: Past Medical History  Diagnosis Date  . Heart murmur   . Hypertension   . Anemia   . Allergy   . Arthritis   . Breast cancer   . Migraines     PAST SURGICAL HISTORY: Past Surgical History  Procedure Laterality Date  . Abdominal hysterectomy      With bilateral salpingo-oophorectomy  . Portacath placement N/A 03/10/2013    Procedure: INSERTION PORT-A-CATH WITH FLUOROSCOPY AND ULTRASOUND;  Surgeon: Ernestene Mention, MD;  Location: [facility] OR;  Service: General;  Laterality: N/A;  . Axillary lymph node biopsy Right 03/10/2013    Procedure: AXILLARY LYMPH NODE BIOPSY;  Surgeon:  Ernestene Mention, MD;  Location: [facility] OR;  Service: General;  Laterality: Right;  sentinel node with blue dye  . Esophagogastroduodenoscopy N/A 04/28/2013    Procedure: ESOPHAGOGASTRODUODENOSCOPY (EGD);  Surgeon: Graylin Shiver, MD;  Location: [facility];  Service: Endoscopy;  Laterality: N/A;  . Colonoscopy N/A 04/30/2013     Procedure: COLONOSCOPY;  Surgeon: Graylin Shiver, MD;  Location: [facility] ENDOSCOPY;  Service: Endoscopy;  Laterality: N/A;    FAMILY HISTORY Family History  Problem Relation Age of Onset  . Cancer Mother 67    Unknown type of cancer  . Congestive Heart Failure Mother   . Colon cancer Brother   . Diabetes Brother 109  . Heart disease Brother     AMI 05/22/2012  . Congestive Heart Failure Brother   . Cancer Maternal Grandmother 3    Unknown type of cancer   the patient's father died in an automobile accident at the age of 27. The patient's mother lived to be 70. Bradie has 3 brothers, no sisters. One brother had colon cancer at the age of 39. The patient's mother and the patient's mothers mother (maternal grandmother) both were diagnosed with some kind of cancer at the age of 45 or thereabouts, but Alanah does not know what type of cancer this may have been.  GYNECOLOGIC HISTORY:  Menarche age 89, first live birth age 64. The patient had her hysterectomy in the 1970s. She has been on estrogen replacement for she thinks 18 years.  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 She is divorced, lives by herself, with no pets. Her first child was given up for adoption. Her second child, [name], is a Geophysicist/field seismologist and is working towards a PhD at [employer]. Yesenia has no grandchildren. She attends new [employer]    ADVANCED DIRECTIVES: In place. The patient has named her daughter [name] as her healthcare power of attorney. [name] can be reached at [phone]. The healthcare power of attorney documents were brought by the patient 03/13/2013 and are separately scanned.   HEALTH MAINTENANCE: (Updated 06/30/2013) History  Substance Use Topics  . Smoking status: Former Smoker    Types: Cigarettes  . Smokeless tobacco: Never Used     Comment:  smoked in college 1 yr   . Alcohol Use: No     Comment: Wine occasionally     Colonoscopy: August 2012  PAP:  Bone density: Never  Lipid panel: Dr. Jackelyn Knife   Allergies  Allergen Reactions  . Penicillins Rash    Current Outpatient Prescriptions  Medication Sig Dispense Refill  . Acetaminophen (TYLENOL EXTRA STRENGTH PO) Take 1 tablet by mouth as needed.      . Alum & Mag Hydroxide-Simeth (MAGIC MOUTHWASH W/LIDOCAINE) SOLN Take 5 mLs by mouth 4 (four) times daily as needed.      . ciprofloxacin (CIPRO) 500 MG tablet Take 1 tablet (500 mg total) by mouth 2 (two) times daily.  14 tablet  1  . fluconazole (DIFLUCAN) 100 MG tablet 2 tabs by mouth x 1 day, then 1 tab by mouth daily for total of 7 days  8 tablet  2  . HYDROcodone-acetaminophen (NORCO/VICODIN) 5-325 MG per tablet       . lidocaine-prilocaine (EMLA) cream Apply 1 application topically as needed (port access). Apply to port area 1-2 hours before chemotherapy; cover with plastioc wrap      .  LORazepam (ATIVAN) 0.5 MG tablet Take 1 tablet (0.5 mg total) by mouth at bedtime as needed for anxiety.  20 tablet  0  . Multiple Vitamin (MULTIVITAMIN WITH MINERALS) TABS Take 1 tablet by mouth daily.      . ondansetron (ZOFRAN) 8 MG tablet Take 1 tablet (8 mg total) by mouth every 12 (twelve) hours as needed for nausea.  20 tablet  3  . pantoprazole (PROTONIX) 40 MG tablet Take 1 tablet (40 mg total) by mouth daily at 12 noon.  30 tablet  2  . prochlorperazine (COMPAZINE) 10 MG tablet Take 10 mg by mouth every 6 (six) hours as needed (nausea/anxiety).      . traMADol (ULTRAM) 50 MG tablet Take 1 tablet (50 mg total) by mouth every 6 (six) hours as needed for pain.  60 tablet  0  . triamterene-hydrochlorothiazide (MAXZIDE-25) 37.5-25 MG per tablet Take 1 tablet by mouth daily.  30 tablet  3   No current facility-administered medications for this visit.    OBJECTIVE: Middle-aged [place] American woman in no acute distress Filed Vitals:    07/14/13 0951  BP: 138/84  Pulse: 80  Temp: 98.5 F (36.9 C)  Resp: 20     Body mass index is 26.63 kg/(m^2).    ECOG FS: 1 Filed Weights   07/14/13 0951  Weight: 155 lb 3.2 oz (70.398 kg)   Physical Exam: HEENT:  Sclerae anicteric.  Oropharynx clear. No ulcerations. Tongue is hyperpigmented. There is a white coating on the tongue and posterior buccal mucosa consistent with candidiasis. NODES:  No cervical or supraclavicular lymphadenopathy palpated.  BREAST EXAM:  Deferred. There is a small palpable abnormality in the right axilla, consistent with known seroma just inferior to the axillary incision.  This seems slightly smaller than with previous exam.  No right axillary lymphadenopathy is palpated. Left axilla is benign with no palpable adenopathy. LUNGS:  Clear to auscultation bilaterally.  No wheezes or rhonchi HEART:  Regular rate and rhythm. No murmur. ABDOMEN:  Soft, nontender.  Positive bowel sounds.  MSK:  No focal spinal tenderness to palpation. Full range of motion in the upper extremities, including the right arm and right elbow. EXTREMITIES:  No peripheral edema.   SKIN:  Port is intact in the left upper chest wall with no erythema, edema, or evidence of infection. Nailbeds are hyperpigmented bilaterally in the upper extremities, but with no drainage or evidence of infection noted. NEURO:  Nonfocal. Well oriented. Fatigued affect.   LAB RESULTS:   Lab Results  Component Value Date   WBC 3.7* 07/14/2013   NEUTROABS 0.1* 07/14/2013   HGB 10.2* 07/14/2013   HCT 29.4* 07/14/2013   MCV 96.6 07/14/2013   PLT 278 07/14/2013      Chemistry      Component Value Date/Time   NA 141 07/14/2013 0935   NA 141 04/30/2013 0530   K 3.6 07/14/2013 0935   K 3.2* 04/30/2013 0530   CL 106 04/30/2013 0530   CL 100 03/13/2013 0918   CO2 25 07/14/2013 0935   CO2 26 04/30/2013 0530   BUN 12.9 07/14/2013 0935   BUN 5* 04/30/2013 0530   CREATININE 1.0 07/14/2013 0935   CREATININE 0.75  04/30/2013 0530   CREATININE 0.94 11/29/2011 1535      Component Value Date/Time   CALCIUM 9.7 07/14/2013 0935   CALCIUM 8.7 04/30/2013 0530   ALKPHOS 80 07/14/2013 0935   ALKPHOS 96 02/28/2013 0937   AST 25 07/14/2013 0935  AST 27 02/28/2013 0937   ALT 27 07/14/2013 0935   ALT 27 02/28/2013 0937   BILITOT 0.31 07/14/2013 0935   BILITOT 0.3 02/28/2013 0937       STUDIES: No results found.    ASSESSMENT: 67 y.o.  woman   (1)  status post right central breast biopsy 01/31/2013 for a clinical T2 NX, stage II invasive ductal carcinoma, grade 3, triple negative, with an MIB-1 of 84%  (2) biopsy of an enlarged (2.5 cm) right axillary lymph node 01/31/2013 was negative, felt possibly discordant  (3) sentinel lymph node sampling 03/10/2013 showed one of two sentinel nodes to be positive; repeat prognostic profile pending  (4) neoadjuvant doxorubicin and cyclophosphamide being given in dose dense fashion with Neulasta support starting 03/17/2013, followed by weekly carboplatin and paclitaxel x12, with first cycle on 05/27/2013.  (5)  status post hospitalization in early August 2014 secondary to a significant drop in hemoglobin leading to syncope and loss of consciousness.  (6)  Hx of symptomatic anemia, likely chemo-induced.  Status post blood transfusion, 2 units of packed red blood cells, on 06/10/2013. Now stable.  (7) Right elbow pain, stable to improved  (8)  chemotherapy-induced afebrile neutropenia, receiving Neupogen x3, ending started on Cipro prophylactically.  (9) anemia, to be followed   (10) chemotherapy-induced peripheral neuropathy, hands and feet, to be followed  (11) oropharyngeal candidiasis, treated with Magic mouthwash and Diflucan     PLAN: We will again hold Trenesha's seventh dose of carboplatin/paclitaxel today due to her neutropenia. She is going to receive Neupogen today and for the next 2 days, and we will repeat labs when she sees me again next Monday on  October 27. We will continue to follow her very closely for the peripheral neuropathy which does in fact seem to be worsening. We will discuss next week whether or not to proceed with her current regimen, or make a change in her treatment plan.  In the meanwhile, she will start back on Cipro prophylactically, 500 mg by mouth twice a day for 7 days. We reviewed neutropenic precautions, and she knows to call with any fevers of 100 or above. She'll continue to use Magic mouthwash, and I am also starting her on Diflucan for the next 7 days to treat oropharyngeal candidiasis.  Alle voices understanding and agreement with our plan thus far, and will call with any changes or problems prior to her next scheduled appointment.   Kiaan Overholser, PA-C   07/14/2013 4:30 PM

## 2013-07-14 NOTE — Progress Notes (Signed)
This patient is a 67 year old female diagnosed with breast cancer receiving chemotherapy.  Past medical history includes heart murmur, hypertension, anemia, and migraines.  Medications include Cipro, Ativan, multivitamin, Zofran, Protinix, and Compazine.  Labs include potassium 3.3.  Height: 64 inches. Weight: 155.8 pounds. Usual body weight: 150-155 pounds. BMI: 26.73.  Patient is requesting general diet information for how to eat during treatment.  She does have taste alterations and describes no taste of foods.  She has occasional nausea, which is relieved with medications.  Her weight is within her usual body weight range.  Nutrition diagnosis: Food and nutrition related knowledge deficit related to diagnosis of breast cancer and associated treatments as evidenced by no prior need for nutrition related information.  Intervention: Patient was educated to consume calories and protein throughout the day in 6 small meals or snacks.  She is to strive for weight maintenance.  She was educated on high-protein foods.  She was also educated on strategies for eating if she develops worsening nausea.  Tips were provided on improving her taste.  She was also educated on food safety.  We've briefly discussed the importance of plant-based diet.  Questions were answered regarding organic foods.  Fact sheets were given.  Teach back method used.  Contact information was given.  Monitoring, evaluation, goals: Patient will tolerate adequate calories and protein to promote weight maintenance throughout treatment.  She will include healthy, plant-based proteins and focus on a plant-based diet.  Next visit: No followup necessary.  Patient has my contact information for questions or concerns.

## 2013-07-15 ENCOUNTER — Ambulatory Visit (HOSPITAL_BASED_OUTPATIENT_CLINIC_OR_DEPARTMENT_OTHER): Payer: Medicare Other

## 2013-07-15 VITALS — BP 127/76 | HR 109 | Temp 98.6°F

## 2013-07-15 DIAGNOSIS — C50111 Malignant neoplasm of central portion of right female breast: Secondary | ICD-10-CM

## 2013-07-15 DIAGNOSIS — D702 Other drug-induced agranulocytosis: Secondary | ICD-10-CM

## 2013-07-15 MED ORDER — FILGRASTIM 300 MCG/0.5ML IJ SOLN
300.0000 ug | Freq: Once | INTRAMUSCULAR | Status: AC
  Administered 2013-07-15: 300 ug via SUBCUTANEOUS
  Filled 2013-07-15: qty 0.5

## 2013-07-15 NOTE — Patient Instructions (Signed)
Filgrastim, G-CSF injection What is this medicine? FILGRASTIM, G-CSF (fil GRA stim) stimulates the formation of white blood cells. This medicine is given to patients with conditions that may cause a decrease in white blood cells, like those receiving certain types of chemotherapy or bone marrow transplant. It helps the bone marrow recover its ability to produce white blood cells. Increasing the amount of white blood cells helps to decrease the risk of infection and fever. This medicine may be used for other purposes; ask your health care provider or pharmacist if you have questions. What should I tell my health care provider before I take this medicine? They need to know if you have any of these conditions: -currently receiving radiation therapy -sickle cell disease -an unusual or allergic reaction to filgrastim, E. coli protein, other medicines, foods, dyes, or preservatives -pregnant or trying to get pregnant -breast-feeding How should I use this medicine? This medicine is for injection into a vein or injection under the skin. It is usually given by a health care professional in a hospital or clinic setting. If you get this medicine at home, you will be taught how to prepare and give this medicine. Always change the site for the injection under the skin. Let the solution warm to room temperature before you use it. Do not shake the solution before you withdraw a dose. Throw away any unused portion. Use exactly as directed. Take your medicine at regular intervals. Do not take your medicine more often than directed. It is important that you put your used needles and syringes in a special sharps container. Do not put them in a trash can. If you do not have a sharps container, call your pharmacist or healthcare provider to get one. Talk to your pediatrician regarding the use of this medicine in children. While this medicine may be prescribed for children for selected conditions, precautions do  apply. Overdosage: If you think you have taken too much of this medicine contact a poison control center or emergency room at once. NOTE: This medicine is only for you. Do not share this medicine with others. What if I miss a dose? Try not to miss doses. If you miss a dose take the dose as soon as you remember. If it is almost time for the next dose, do not take double doses unless told to by your doctor or health care professional. What may interact with this medicine? -lithium -medicines for cancer chemotherapy This list may not describe all possible interactions. Give your health care provider a list of all the medicines, herbs, non-prescription drugs, or dietary supplements you use. Also tell them if you smoke, drink alcohol, or use illegal drugs. Some items may interact with your medicine. What should I watch for while using this medicine? Visit your doctor or health care professional for regular checks on your progress. If you get a fever or any sign of infection while you are using this medicine, do not treat yourself. Check with your doctor or health care professional. Bone pain can usually be relieved by mild pain relievers such as acetaminophen or ibuprofen. Check with your doctor or health care professional before taking these medicines as they may hide a fever. Call your doctor or health care professional if the aches and pains are severe or do not go away. What side effects may I notice from receiving this medicine? Side effects that you should report to your doctor or health care professional as soon as possible: -allergic reactions like skin rash, itching   or hives, swelling of the face, lips, or tongue -difficulty breathing, wheezing -fever -pain, redness, or swelling at the injection site -stomach or side pain, or pain at the shoulder Side effects that usually do not require medical attention (report to your doctor or health care professional if they continue or are  bothersome): -bone pain (ribs, lower back, breast bone) -headache -skin rash This list may not describe all possible side effects. Call your doctor for medical advice about side effects. You may report side effects to FDA at 1-800-FDA-1088. Where should I keep my medicine? Keep out of the reach of children. Store in a refrigerator between 2 and 8 degrees C (36 and 46 degrees F). Do not freeze or leave in direct sunlight. If vials or syringes are left out of the refrigerator for more than 24 hours, they must be thrown away. Throw away unused vials after the expiration date on the carton. NOTE: This sheet is a summary. It may not cover all possible information. If you have questions about this medicine, talk to your doctor, pharmacist, or health care provider.  2013, [employer]. (11/27/2007 1:33:21 PM)  

## 2013-07-16 ENCOUNTER — Ambulatory Visit (HOSPITAL_BASED_OUTPATIENT_CLINIC_OR_DEPARTMENT_OTHER): Payer: Medicare Other

## 2013-07-16 VITALS — BP 127/78 | HR 113 | Temp 98.7°F

## 2013-07-16 DIAGNOSIS — C50111 Malignant neoplasm of central portion of right female breast: Secondary | ICD-10-CM

## 2013-07-16 DIAGNOSIS — D702 Other drug-induced agranulocytosis: Secondary | ICD-10-CM

## 2013-07-16 MED ORDER — FILGRASTIM 300 MCG/0.5ML IJ SOLN
300.0000 ug | Freq: Once | INTRAMUSCULAR | Status: AC
  Administered 2013-07-16: 300 ug via SUBCUTANEOUS
  Filled 2013-07-16: qty 0.5

## 2013-07-21 ENCOUNTER — Telehealth: Payer: Self-pay | Admitting: *Deleted

## 2013-07-21 ENCOUNTER — Ambulatory Visit (HOSPITAL_BASED_OUTPATIENT_CLINIC_OR_DEPARTMENT_OTHER): Payer: Medicare Other | Admitting: Physician Assistant

## 2013-07-21 ENCOUNTER — Ambulatory Visit (HOSPITAL_BASED_OUTPATIENT_CLINIC_OR_DEPARTMENT_OTHER): Payer: Medicare Other

## 2013-07-21 ENCOUNTER — Encounter: Payer: Self-pay | Admitting: Oncology

## 2013-07-21 ENCOUNTER — Encounter: Payer: Self-pay | Admitting: Physician Assistant

## 2013-07-21 ENCOUNTER — Other Ambulatory Visit (HOSPITAL_BASED_OUTPATIENT_CLINIC_OR_DEPARTMENT_OTHER): Payer: Medicare Other | Admitting: Lab

## 2013-07-21 VITALS — BP 133/84 | HR 106 | Temp 97.7°F | Resp 19 | Ht 64.0 in | Wt 151.9 lb

## 2013-07-21 DIAGNOSIS — Z5111 Encounter for antineoplastic chemotherapy: Secondary | ICD-10-CM

## 2013-07-21 DIAGNOSIS — C50111 Malignant neoplasm of central portion of right female breast: Secondary | ICD-10-CM

## 2013-07-21 DIAGNOSIS — Z171 Estrogen receptor negative status [ER-]: Secondary | ICD-10-CM

## 2013-07-21 DIAGNOSIS — D649 Anemia, unspecified: Secondary | ICD-10-CM

## 2013-07-21 DIAGNOSIS — M25521 Pain in right elbow: Secondary | ICD-10-CM

## 2013-07-21 DIAGNOSIS — C50119 Malignant neoplasm of central portion of unspecified female breast: Secondary | ICD-10-CM

## 2013-07-21 LAB — CBC WITH DIFFERENTIAL/PLATELET
BASO%: 1 % (ref 0.0–2.0)
Basophils Absolute: 0.2 10*3/uL — ABNORMAL HIGH (ref 0.0–0.1)
Eosinophils Absolute: 0.1 10*3/uL (ref 0.0–0.5)
HGB: 11.5 g/dL — ABNORMAL LOW (ref 11.6–15.9)
LYMPH%: 22.3 % (ref 14.0–49.7)
MCHC: 33.6 g/dL (ref 31.5–36.0)
MONO#: 2.9 10*3/uL — ABNORMAL HIGH (ref 0.1–0.9)
MONO%: 14.7 % — ABNORMAL HIGH (ref 0.0–14.0)
NEUT#: 12 10*3/uL — ABNORMAL HIGH (ref 1.5–6.5)
NEUT%: 61.7 % (ref 38.4–76.8)
Platelets: 230 10*3/uL (ref 145–400)
RBC: 3.5 10*6/uL — ABNORMAL LOW (ref 3.70–5.45)
RDW: 22.1 % — ABNORMAL HIGH (ref 11.2–14.5)
WBC: 19.5 10*3/uL — ABNORMAL HIGH (ref 3.9–10.3)

## 2013-07-21 MED ORDER — FAMOTIDINE IN NACL 20-0.9 MG/50ML-% IV SOLN
INTRAVENOUS | Status: AC
  Filled 2013-07-21: qty 50

## 2013-07-21 MED ORDER — SODIUM CHLORIDE 0.9 % IV SOLN
170.2000 mg | Freq: Once | INTRAVENOUS | Status: AC
  Administered 2013-07-21: 170 mg via INTRAVENOUS
  Filled 2013-07-21: qty 17

## 2013-07-21 MED ORDER — SODIUM CHLORIDE 0.9 % IV SOLN
Freq: Once | INTRAVENOUS | Status: AC
  Administered 2013-07-21: 10:00:00 via INTRAVENOUS

## 2013-07-21 MED ORDER — DEXAMETHASONE SODIUM PHOSPHATE 20 MG/5ML IJ SOLN
INTRAMUSCULAR | Status: AC
  Filled 2013-07-21: qty 5

## 2013-07-21 MED ORDER — ONDANSETRON 16 MG/50ML IVPB (CHCC)
INTRAVENOUS | Status: AC
  Filled 2013-07-21: qty 16

## 2013-07-21 MED ORDER — DIPHENHYDRAMINE HCL 50 MG/ML IJ SOLN
INTRAMUSCULAR | Status: AC
  Filled 2013-07-21: qty 1

## 2013-07-21 MED ORDER — PACLITAXEL CHEMO INJECTION 300 MG/50ML
80.0000 mg/m2 | Freq: Once | INTRAVENOUS | Status: AC
  Administered 2013-07-21: 138 mg via INTRAVENOUS
  Filled 2013-07-21: qty 23

## 2013-07-21 MED ORDER — HEPARIN SOD (PORK) LOCK FLUSH 100 UNIT/ML IV SOLN
500.0000 [IU] | Freq: Once | INTRAVENOUS | Status: DC | PRN
  Filled 2013-07-21: qty 5

## 2013-07-21 MED ORDER — ONDANSETRON 16 MG/50ML IVPB (CHCC)
16.0000 mg | Freq: Once | INTRAVENOUS | Status: AC
  Administered 2013-07-21: 16 mg via INTRAVENOUS

## 2013-07-21 MED ORDER — DEXAMETHASONE SODIUM PHOSPHATE 20 MG/5ML IJ SOLN
20.0000 mg | Freq: Once | INTRAMUSCULAR | Status: AC
  Administered 2013-07-21: 20 mg via INTRAVENOUS

## 2013-07-21 MED ORDER — SODIUM CHLORIDE 0.9 % IJ SOLN
10.0000 mL | INTRAMUSCULAR | Status: DC | PRN
  Filled 2013-07-21: qty 10

## 2013-07-21 MED ORDER — DIPHENHYDRAMINE HCL 50 MG/ML IJ SOLN
50.0000 mg | Freq: Once | INTRAMUSCULAR | Status: AC
  Administered 2013-07-21: 50 mg via INTRAVENOUS

## 2013-07-21 MED ORDER — FAMOTIDINE IN NACL 20-0.9 MG/50ML-% IV SOLN
20.0000 mg | Freq: Once | INTRAVENOUS | Status: AC
  Administered 2013-07-21: 20 mg via INTRAVENOUS

## 2013-07-21 NOTE — Telephone Encounter (Signed)
appts made and printed. Pt is aware that tx's will be added. i emailed MW to add the tx's...td 

## 2013-07-21 NOTE — Progress Notes (Signed)
ID: Tabitha Ramirez OB: 07-Oct-1945  MR#: [id number]  CSN#:[id number]  PCP: Zenaida Niece, MD GYN:  Lavina Hamman SU: Claud Kelp OTHER MD: Lurline Hare  CHIEF COMPLAINT:  Right Breast Cancer   HISTORY OF PRESENT ILLNESS: Tabitha Ramirez tells me she felt a mass in her right breast about 3 months ago but initially ignored it. She eventually brought it to her physician's attention and was set up for bilateral diagnostic mammography at the [facility] Center 01/31/2013. This showed an area of distortion in the upper outer quadrant measuring approximately 4.3 cm. Diffuse calcifications in both breasts were stable. The mass was palpable at 12:00, 3 cm from the nipple, and by ultrasound it was irregular, hypoechoic, and measured 2.5 cm. In addition there was prominent right axillary adenopathy the largest lymph node measuring 2.5 cm.  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 Biopsy of the lymph node was negative. It did show some lymph node tissue.  Bilateral breast MRI 02/10/2013 showed a ring-enhancing necrotic-appearing 3.4 cm mass in the anterior third of the right breast. There was also a 2.0 cm level one right axillary lymph node that was prominent, but maintains central fat. The left breast the left axilla were benign and there was no internal mammary adenopathy noted.  The patient's subsequent history is as detailed below.  INTERVAL HISTORY: Tabitha Ramirez returns today accompanied by her daughter Tabitha Ramirez for followup of her locally advanced right breast carcinoma. She is due for her seventh of 12 planned weekly doses of carbo/paclitaxel today, with treatment having been held for the past two weeks due to to chemotherapy-induced afebrile neutropenia. She has been on Cipro prophylactically, and denies any recent fevers or chills. She received 2 injections of Neupogen last  week, on 07/14/13 and 07/15/2013, and her counts have improved significantly.  Tabitha Ramirez had also been having increased peripheral neuropathy, but now after holding treatment for 2 weeks, that has improved significantly. On a scale of 1-10 she would rate her neuropathy approximately a 2 at this point. She notices tingling only if she presses on the tips of her fingers. She's able to perform fine motor skills with no problems, and has had no problems with falling or tripping.  REVIEW OF SYSTEMS: Tabitha Ramirez is feeling better overall, is less fatigued, and she denies any fevers or chills. She still has some hyperpigmentation of the skin and also the nailbeds, although she has no nailbed sensitivity and has had no drainage or evidence of infection. Her mouth is still slightly sensitive. Her thrush has improved, but not completely resolved. She's had no ulcerations. She's eating and drinking well with no nausea or change in bowel or bladder habits.  She's had no increased cough, phlegm production, shortness of breath, chest pain, or palpitations. She denies any abnormal headaches, change in vision or dizziness. She currently denies any unusual myalgias, arthralgias, or bony pain, and in fact, the pain in her right elbow has also improved. She's had no peripheral swelling.  A detailed review of systems is otherwise stable and noncontributory.   PAST MEDICAL HISTORY: Past Medical History  Diagnosis Date  . Heart murmur   . Hypertension   . Anemia   . Allergy   . Arthritis   . Breast cancer   . Migraines     PAST SURGICAL HISTORY: Past Surgical History  Procedure Laterality Date  . Abdominal hysterectomy  With bilateral salpingo-oophorectomy  . Portacath placement N/A 03/10/2013    Procedure: INSERTION PORT-A-CATH WITH FLUOROSCOPY AND ULTRASOUND;  Surgeon: Ernestene Mention, MD;  Location: [facility] OR;  Service: General;  Laterality: N/A;  . Axillary lymph node biopsy Right 03/10/2013    Procedure: AXILLARY  LYMPH NODE BIOPSY;  Surgeon: Ernestene Mention, MD;  Location: [facility] OR;  Service: General;  Laterality: Right;  sentinel node with blue dye  . Esophagogastroduodenoscopy N/A 04/28/2013    Procedure: ESOPHAGOGASTRODUODENOSCOPY (EGD);  Surgeon: Graylin Shiver, MD;  Location: [facility];  Service: Endoscopy;  Laterality: N/A;  . Colonoscopy N/A 04/30/2013    Procedure: COLONOSCOPY;  Surgeon: Graylin Shiver, MD;  Location: [facility];  Service: Endoscopy;  Laterality: N/A;    FAMILY HISTORY Family History  Problem Relation Age of Onset  . Cancer Mother 44    Unknown type of cancer  . Congestive Heart Failure Mother   . Colon cancer Brother   . Diabetes Brother 44  . Heart disease Brother     AMI 05/22/2012  . Congestive Heart Failure Brother   . Cancer Maternal Grandmother 75    Unknown type of cancer   the patient's father died in an automobile accident at the age of 25. The patient's mother lived to be 86. Tabitha Ramirez has 3 brothers, no sisters. One brother had colon cancer at the age of 37. The patient's mother and the patient's mothers mother (maternal grandmother) both were diagnosed with some kind of cancer at the age of 36 or thereabouts, but Juri does not know what type of cancer this may have been.  GYNECOLOGIC HISTORY:  Menarche age 55, first live birth age 72. The patient had her hysterectomy in the 1970s. She has been on estrogen replacement for she thinks 18 years.  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 She is divorced, lives by herself, with no pets. Her first child was given up for adoption. Her second child, Tabitha Ramirez, is a Geophysicist/field seismologist and is working towards a PhD at [employer]. Tabitha Ramirez has no grandchildren. She attends new [employer]    ADVANCED DIRECTIVES: In place. The patient has named her daughter Tabitha Ramirez as her healthcare  power of attorney. Tabitha Ramirez can be reached at [phone]. The healthcare power of attorney documents were brought by the patient 03/13/2013 and are separately scanned.   HEALTH MAINTENANCE: (Updated 06/30/2013) History  Substance Use Topics  . Smoking status: Former Smoker    Types: Cigarettes  . Smokeless tobacco: Never Used     Comment: smoked in college 1 yr   . Alcohol Use: No     Comment: Wine occasionally     Colonoscopy: August 2012  PAP:  Bone density: Never  Lipid panel: Dr. Jackelyn Knife   Allergies  Allergen Reactions  . Penicillins Rash    Current Outpatient Prescriptions  Medication Sig Dispense Refill  . Acetaminophen (TYLENOL EXTRA STRENGTH PO) Take 1 tablet by mouth as needed.      . Alum & Mag Hydroxide-Simeth (MAGIC MOUTHWASH W/LIDOCAINE) SOLN Take 5 mLs by mouth 4 (four) times daily as needed.      Marland Kitchen HYDROcodone-acetaminophen (NORCO/VICODIN) 5-325 MG per tablet       . lidocaine-prilocaine (EMLA) cream Apply 1 application topically as needed (port access). Apply to port area 1-2 hours before chemotherapy; cover with plastioc wrap      .  LORazepam (ATIVAN) 0.5 MG tablet Take 1 tablet (0.5 mg total) by mouth at bedtime as needed for anxiety.  20 tablet  0  . Multiple Vitamin (MULTIVITAMIN WITH MINERALS) TABS Take 1 tablet by mouth daily. LIIQUID - Takes 1 cap full daily      . ondansetron (ZOFRAN) 8 MG tablet Take 1 tablet (8 mg total) by mouth every 12 (twelve) hours as needed for nausea.  20 tablet  3  . pantoprazole (PROTONIX) 40 MG tablet Take 1 tablet (40 mg total) by mouth daily at 12 noon.  30 tablet  2  . prochlorperazine (COMPAZINE) 10 MG tablet Take 10 mg by mouth every 6 (six) hours as needed (nausea/anxiety).      . traMADol (ULTRAM) 50 MG tablet Take 1 tablet (50 mg total) by mouth every 6 (six) hours as needed for pain.  60 tablet  0  . triamterene-hydrochlorothiazide (MAXZIDE-25) 37.5-25 MG per tablet Take 0.5 tablets by mouth daily.      .  ciprofloxacin (CIPRO) 500 MG tablet Take 1 tablet (500 mg total) by mouth 2 (two) times daily.  14 tablet  1  . fluconazole (DIFLUCAN) 100 MG tablet 2 tabs by mouth x 1 day, then 1 tab by mouth daily for total of 7 days  8 tablet  2   No current facility-administered medications for this visit.   Facility-Administered Medications Ordered in Other Visits  Medication Dose Route Frequency Provider Last Rate Last Dose  . CARBOplatin (PARAPLATIN) 170 mg in sodium chloride 0.9 % 100 mL chemo infusion  170 mg Intravenous Once Delaina Fetsch [employer], PA-C      . Dexamethasone Sodium Phosphate (DECADRON) injection 20 mg  20 mg Intravenous Once Taurean Ju G Lael Pilch, PA-C      . diphenhydrAMINE (BENADRYL) injection 50 mg  50 mg Intravenous Once Gabor Lusk G Tray Klayman, PA-C      . famotidine (PEPCID) IVPB 20 mg  20 mg Intravenous Once Adeana Grilliot G Lizvette Lightsey, PA-C      . heparin lock flush 100 unit/mL  500 Units Intracatheter Once PRN Prerana Strayer G Yaslene Lindamood, PA-C      . ondansetron (ZOFRAN) IVPB 16 mg  16 mg Intravenous Once Kasi Lasky G Tinsleigh Slovacek, PA-C      . PACLitaxel (TAXOL) 138 mg in dextrose 5 % 250 mL chemo infusion (</= 80mg /m2)  80 mg/m2 (Treatment Plan Actual) Intravenous Once Avarey Yaeger G Dickie Cloe, PA-C      . sodium chloride 0.9 % injection 10 mL  10 mL Intracatheter PRN Willella Harding Allegra Grana, PA-C        OBJECTIVE: Middle-aged [place] American woman in no acute distress Filed Vitals:   07/21/13 0831  BP: 133/84  Pulse: 106  Temp: 97.7 F (36.5 C)  Resp: 19     Body mass index is 26.06 kg/(m^2).    ECOG FS: 1 Filed Weights   07/21/13 0831  Weight: 151 lb 14.4 oz (68.901 kg)   Physical Exam: HEENT:  Sclerae anicteric.  Oropharynx clear. No ulcerations. Tongue is hyperpigmented. There is a slight white coating on the tongue consistent with candidiasis. NODES:  No cervical or supraclavicular lymphadenopathy palpated.  BREAST EXAM: Palpable mass in the central portion of the right breast, measuring approximately 3 cm in diameter today. I was also unable to palpate a  distinct mass/seroma in the right axilla today. Left breast is unremarkable. Axillae are otherwise benign, with no palpable lymphadenopathy. LUNGS:  Clear to auscultation bilaterally.  No wheezes or rhonchi HEART:  Regular rate and rhythm.  No murmur. ABDOMEN:  Soft, nontender.  Positive bowel sounds.  MSK:  No focal spinal tenderness to palpation. Full range of motion in the upper extremities, including the right arm and right elbow. EXTREMITIES:  No peripheral edema.   SKIN:  Port is intact in the left upper chest wall with no erythema, edema, or evidence of infection. Nailbeds are hyperpigmented bilaterally in the upper extremities, but with no drainage or evidence of infection noted. NEURO:  Nonfocal. Well oriented. Positive  affect.   LAB RESULTS:   Lab Results  Component Value Date   WBC 19.5* 07/21/2013   NEUTROABS 12.0* 07/21/2013   HGB 11.5* 07/21/2013   HCT 34.2* 07/21/2013   MCV 97.7 07/21/2013   PLT 230 07/21/2013      Chemistry      Component Value Date/Time   NA 141 07/14/2013 0935   NA 141 04/30/2013 0530   K 3.6 07/14/2013 0935   K 3.2* 04/30/2013 0530   CL 106 04/30/2013 0530   CL 100 03/13/2013 0918   CO2 25 07/14/2013 0935   CO2 26 04/30/2013 0530   BUN 12.9 07/14/2013 0935   BUN 5* 04/30/2013 0530   CREATININE 1.0 07/14/2013 0935   CREATININE 0.75 04/30/2013 0530   CREATININE 0.94 11/29/2011 1535      Component Value Date/Time   CALCIUM 9.7 07/14/2013 0935   CALCIUM 8.7 04/30/2013 0530   ALKPHOS 80 07/14/2013 0935   ALKPHOS 96 02/28/2013 0937   AST 25 07/14/2013 0935   AST 27 02/28/2013 0937   ALT 27 07/14/2013 0935   ALT 27 02/28/2013 0937   BILITOT 0.31 07/14/2013 0935   BILITOT 0.3 02/28/2013 0937       STUDIES: No results found.    ASSESSMENT: 67 y.o. [place] woman   (1)  status post right central breast biopsy 01/31/2013 for a clinical T2 NX, stage II invasive ductal carcinoma, grade 3, triple negative, with an MIB-1 of 84%  (2) biopsy of an enlarged  (2.5 cm) right axillary lymph node 01/31/2013 was negative, felt possibly discordant  (3) sentinel lymph node sampling 03/10/2013 showed one of two sentinel nodes to be positive; repeat prognostic profile pending  (4) neoadjuvant doxorubicin and cyclophosphamide being given in dose dense fashion with Neulasta support starting 03/17/2013, followed by weekly carboplatin and paclitaxel x12, with first cycle on 05/27/2013.  (5)  status post hospitalization in early August 2014 secondary to a significant drop in hemoglobin leading to syncope and loss of consciousness.  (6)  Hx of symptomatic anemia, likely chemo-induced.  Status post blood transfusion, 2 units of packed red blood cells, on 06/10/2013. Now stable.  (7) Right elbow pain, stable to improved  (8)  Hx chemotherapy-induced afebrile neutropenia, resolved  (9) anemia, to be followed   (10) chemotherapy-induced peripheral neuropathy, hands and feet, stable to improved  (11) oropharyngeal candidiasis, treated with Magic mouthwash and Diflucan, and  improved    PLAN: We spent quite a time today discussing Amandine's neuropathy, and it does appear to be much improved. She'll proceed to treatment today as scheduled for her seventh of 12 planned weekly doses of carboplatin/paclitaxel. We will continue to treat her on a weekly basis, and at this point plan on her 12th and final dose to be given on December 1. Of course we will follow her very closely for any additional neutropenia, but also for any changes in her neuropathy.  Tabitha Ramirez's thrush has improved significantly, but she'll continue to utilize her Magic mouthwash 4 times  daily for the next several days.  I will see Tabitha Ramirez again in one week, November 3, for labs and physical exam. She will call prior that time, however, with any changes or problems.   Kushal Saunders, PA-C   07/21/2013 9:54 AM

## 2013-07-21 NOTE — Patient Instructions (Signed)
[facility] Discharge Instructions for Patients Receiving Chemotherapy  Today you received the following chemotherapy agents: Taxol, Carboplatin  To help prevent nausea and vomiting after your treatment, we encourage you to take your nausea medication as prescribed.    If you develop nausea and vomiting that is not controlled by your nausea medication, call the clinic.   BELOW ARE SYMPTOMS THAT SHOULD BE REPORTED IMMEDIATELY:  *FEVER GREATER THAN 100.5 F  *CHILLS WITH OR WITHOUT FEVER  NAUSEA AND VOMITING THAT IS NOT CONTROLLED WITH YOUR NAUSEA MEDICATION  *UNUSUAL SHORTNESS OF BREATH  *UNUSUAL BRUISING OR BLEEDING  TENDERNESS IN MOUTH AND THROAT WITH OR WITHOUT PRESENCE OF ULCERS  *URINARY PROBLEMS  *BOWEL PROBLEMS  UNUSUAL RASH Items with * indicate a potential emergency and should be followed up as soon as possible.  Feel free to call the clinic you have any questions or concerns. The clinic phone number is [phone].    

## 2013-07-21 NOTE — Telephone Encounter (Signed)
Per staff message and POF I have scheduled appts.  JMW  

## 2013-07-28 ENCOUNTER — Other Ambulatory Visit: Payer: Self-pay | Admitting: Oncology

## 2013-07-28 ENCOUNTER — Telehealth: Payer: Self-pay | Admitting: *Deleted

## 2013-07-28 ENCOUNTER — Encounter: Payer: Self-pay | Admitting: Physician Assistant

## 2013-07-28 ENCOUNTER — Ambulatory Visit (HOSPITAL_BASED_OUTPATIENT_CLINIC_OR_DEPARTMENT_OTHER): Payer: Medicare Other | Admitting: Physician Assistant

## 2013-07-28 ENCOUNTER — Other Ambulatory Visit (HOSPITAL_BASED_OUTPATIENT_CLINIC_OR_DEPARTMENT_OTHER): Payer: Medicare Other

## 2013-07-28 ENCOUNTER — Ambulatory Visit (HOSPITAL_BASED_OUTPATIENT_CLINIC_OR_DEPARTMENT_OTHER): Payer: Medicare Other

## 2013-07-28 VITALS — BP 111/67 | HR 116 | Temp 98.5°F | Resp 18 | Ht 64.0 in | Wt 148.0 lb

## 2013-07-28 DIAGNOSIS — D649 Anemia, unspecified: Secondary | ICD-10-CM

## 2013-07-28 DIAGNOSIS — C50119 Malignant neoplasm of central portion of unspecified female breast: Secondary | ICD-10-CM

## 2013-07-28 DIAGNOSIS — C50111 Malignant neoplasm of central portion of right female breast: Secondary | ICD-10-CM

## 2013-07-28 DIAGNOSIS — E876 Hypokalemia: Secondary | ICD-10-CM

## 2013-07-28 DIAGNOSIS — Z5111 Encounter for antineoplastic chemotherapy: Secondary | ICD-10-CM

## 2013-07-28 DIAGNOSIS — Z171 Estrogen receptor negative status [ER-]: Secondary | ICD-10-CM

## 2013-07-28 DIAGNOSIS — B370 Candidal stomatitis: Secondary | ICD-10-CM

## 2013-07-28 DIAGNOSIS — G609 Hereditary and idiopathic neuropathy, unspecified: Secondary | ICD-10-CM

## 2013-07-28 LAB — CBC WITH DIFFERENTIAL/PLATELET
BASO%: 1.1 % (ref 0.0–2.0)
Basophils Absolute: 0 10*3/uL (ref 0.0–0.1)
EOS%: 0.8 % (ref 0.0–7.0)
HGB: 10.7 g/dL — ABNORMAL LOW (ref 11.6–15.9)
MCH: 33.7 pg (ref 25.1–34.0)
MCHC: 34.4 g/dL (ref 31.5–36.0)
MCV: 98.1 fL (ref 79.5–101.0)
MONO%: 8.9 % (ref 0.0–14.0)
RBC: 3.17 10*6/uL — ABNORMAL LOW (ref 3.70–5.45)
RDW: 20.9 % — ABNORMAL HIGH (ref 11.2–14.5)
WBC: 4.1 10*3/uL (ref 3.9–10.3)

## 2013-07-28 LAB — COMPREHENSIVE METABOLIC PANEL (CC13)
AST: 21 U/L (ref 5–34)
Albumin: 3.7 g/dL (ref 3.5–5.0)
Alkaline Phosphatase: 81 U/L (ref 40–150)
BUN: 22.6 mg/dL (ref 7.0–26.0)
Calcium: 10.2 mg/dL (ref 8.4–10.4)
Chloride: 103 mEq/L (ref 98–109)
Potassium: 3.4 mEq/L — ABNORMAL LOW (ref 3.5–5.1)
Total Bilirubin: 0.94 mg/dL (ref 0.20–1.20)

## 2013-07-28 MED ORDER — FAMOTIDINE IN NACL 20-0.9 MG/50ML-% IV SOLN
INTRAVENOUS | Status: AC
  Filled 2013-07-28: qty 50

## 2013-07-28 MED ORDER — ONDANSETRON 16 MG/50ML IVPB (CHCC)
16.0000 mg | Freq: Once | INTRAVENOUS | Status: AC
  Administered 2013-07-28: 16 mg via INTRAVENOUS

## 2013-07-28 MED ORDER — PACLITAXEL CHEMO INJECTION 300 MG/50ML
80.0000 mg/m2 | Freq: Once | INTRAVENOUS | Status: AC
  Administered 2013-07-28: 138 mg via INTRAVENOUS
  Filled 2013-07-28: qty 23

## 2013-07-28 MED ORDER — DEXAMETHASONE SODIUM PHOSPHATE 20 MG/5ML IJ SOLN
20.0000 mg | Freq: Once | INTRAMUSCULAR | Status: AC
  Administered 2013-07-28: 20 mg via INTRAVENOUS

## 2013-07-28 MED ORDER — HEPARIN SOD (PORK) LOCK FLUSH 100 UNIT/ML IV SOLN
500.0000 [IU] | Freq: Once | INTRAVENOUS | Status: AC | PRN
  Administered 2013-07-28: 500 [IU]
  Filled 2013-07-28: qty 5

## 2013-07-28 MED ORDER — ONDANSETRON 16 MG/50ML IVPB (CHCC)
INTRAVENOUS | Status: AC
  Filled 2013-07-28: qty 16

## 2013-07-28 MED ORDER — DIPHENHYDRAMINE HCL 50 MG/ML IJ SOLN
50.0000 mg | Freq: Once | INTRAMUSCULAR | Status: AC
  Administered 2013-07-28: 50 mg via INTRAVENOUS

## 2013-07-28 MED ORDER — SODIUM CHLORIDE 0.9 % IJ SOLN
10.0000 mL | INTRAMUSCULAR | Status: DC | PRN
  Administered 2013-07-28: 10 mL
  Filled 2013-07-28: qty 10

## 2013-07-28 MED ORDER — FAMOTIDINE IN NACL 20-0.9 MG/50ML-% IV SOLN
20.0000 mg | Freq: Once | INTRAVENOUS | Status: AC
  Administered 2013-07-28: 20 mg via INTRAVENOUS

## 2013-07-28 MED ORDER — FLUCONAZOLE 100 MG PO TABS: ORAL_TABLET | ORAL | Status: DC

## 2013-07-28 MED ORDER — DEXAMETHASONE SODIUM PHOSPHATE 20 MG/5ML IJ SOLN
INTRAMUSCULAR | Status: AC
  Filled 2013-07-28: qty 5

## 2013-07-28 MED ORDER — DIPHENHYDRAMINE HCL 50 MG/ML IJ SOLN
INTRAMUSCULAR | Status: AC
  Filled 2013-07-28: qty 1

## 2013-07-28 MED ORDER — MAGIC MOUTHWASH W/LIDOCAINE: 5.0000 mL | Freq: Four times a day (QID) | ORAL | Status: DC | PRN

## 2013-07-28 MED ORDER — SODIUM CHLORIDE 0.9 % IV SOLN
150.2000 mg | Freq: Once | INTRAVENOUS | Status: AC
  Administered 2013-07-28: 150 mg via INTRAVENOUS
  Filled 2013-07-28: qty 15

## 2013-07-28 MED ORDER — SODIUM CHLORIDE 0.9 % IV SOLN
Freq: Once | INTRAVENOUS | Status: AC
  Administered 2013-07-28: 12:00:00 via INTRAVENOUS

## 2013-07-28 NOTE — Telephone Encounter (Signed)
appts made and printed...td 

## 2013-07-28 NOTE — Progress Notes (Signed)
ID: Tabitha Ramirez OB: 1945-12-18  MR#: [id number]  CSN#:[id number]  PCP: Zenaida Niece, MD GYN:  Lavina Hamman SU: Claud Kelp OTHER MD: Lurline Hare  CHIEF COMPLAINT:  Right Breast Cancer   HISTORY OF PRESENT ILLNESS: Tabitha Ramirez tells me she felt a mass in her right breast about 3 months ago but initially ignored it. She eventually brought it to her physician's attention and was set up for bilateral diagnostic mammography at the [facility] Center 01/31/2013. This showed an area of distortion in the upper outer quadrant measuring approximately 4.3 cm. Diffuse calcifications in both breasts were stable. The mass was palpable at 12:00, 3 cm from the nipple, and by ultrasound it was irregular, hypoechoic, and measured 2.5 cm. In addition there was prominent right axillary adenopathy the largest lymph node measuring 2.5 cm.  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 Biopsy of the lymph node was negative. It did show some lymph node tissue.  Bilateral breast MRI 02/10/2013 showed a ring-enhancing necrotic-appearing 3.4 cm mass in the anterior third of the right breast. There was also a 2.0 cm level one right axillary lymph node that was prominent, but maintains central fat. The left breast the left axilla were benign and there was no internal mammary adenopathy noted.  The patient's subsequent history is as detailed below.  INTERVAL HISTORY: Tabitha Ramirez returns today accompanied by her daughter Tabitha Ramirez for followup of her locally advanced right breast carcinoma. She is due for her eighth of 12 planned weekly doses of carbo/paclitaxel today.  She tolerated last week's treatment well. She doesn't feel like her neuropathy is worse. In fact the tips of her fingers feel a little bit better. She does have some intermittent numbness and tingling in the balls of her feet  bilaterally. This is still mild per her report, and she tells me she is able to perform all fine motor skills, perform all of her day-to-day activities, and has had no problems with walking.   REVIEW OF SYSTEMS: Tabitha Ramirez is feeling less fatigued.  She denies any fevers or chills. She still has some hyperpigmentation of the skin and also the nailbeds, although she denies nailbed sensitivity, drainage or evidence of infection. Her mouth is sore, and she thinks she has developed thrush again. Her appetite is fair. She's had no problems with nausea or change in bowel or bladder habits. She admits that she is now drinking as much water as she probably should.  She's had no increased cough, phlegm production, shortness of breath, chest pain, or palpitations. She denies any abnormal headaches, change in vision or dizziness. She currently denies any unusual myalgias, arthralgias, or bony pain, and in fact, the pain in her right elbow has now resolved. She's had no peripheral swelling.  A detailed review of systems is otherwise stable and noncontributory.   PAST MEDICAL HISTORY: Past Medical History  Diagnosis Date  . Heart murmur   . Hypertension   . Anemia   . Allergy   . Arthritis   . Breast cancer   . Migraines     PAST SURGICAL HISTORY: Past Surgical History  Procedure Laterality Date  . Abdominal hysterectomy      With bilateral salpingo-oophorectomy  . Portacath placement N/A 03/10/2013    Procedure: INSERTION PORT-A-CATH WITH FLUOROSCOPY AND ULTRASOUND;  Surgeon: Ernestene Mention, MD;  Location: [facility] OR;  Service: General;  Laterality:  N/A;  . Axillary lymph node biopsy Right 03/10/2013    Procedure: AXILLARY LYMPH NODE BIOPSY;  Surgeon: Ernestene Mention, MD;  Location: [facility] OR;  Service: General;  Laterality: Right;  sentinel node with blue dye  . Esophagogastroduodenoscopy N/A 04/28/2013    Procedure: ESOPHAGOGASTRODUODENOSCOPY (EGD);  Surgeon: Graylin Shiver, MD;  Location: [facility];   Service: Endoscopy;  Laterality: N/A;  . Colonoscopy N/A 04/30/2013    Procedure: COLONOSCOPY;  Surgeon: Graylin Shiver, MD;  Location: [facility];  Service: Endoscopy;  Laterality: N/A;    FAMILY HISTORY Family History  Problem Relation Age of Onset  . Cancer Mother 34    Unknown type of cancer  . Congestive Heart Failure Mother   . Colon cancer Brother   . Diabetes Brother 69  . Heart disease Brother     AMI 05/22/2012  . Congestive Heart Failure Brother   . Cancer Maternal Grandmother 37    Unknown type of cancer   the patient's father died in an automobile accident at the age of 97. The patient's mother lived to be 71. Tabitha Ramirez has 3 brothers, no sisters. One brother had colon cancer at the age of 54. The patient's mother and the patient's mothers mother (maternal grandmother) both were diagnosed with some kind of cancer at the age of 58 or thereabouts, but Tabitha Ramirez does not know what type of cancer this may have been.  GYNECOLOGIC HISTORY:  Menarche age 55, first live birth age 88. The patient had her hysterectomy in the 1970s. She has been on estrogen replacement for she thinks 18 years.  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 She is divorced, lives by herself, with no pets. Her first child was given up for adoption. Her second child, Tabitha Ramirez, is a Geophysicist/field seismologist and is working towards a PhD at [employer]. Tabitha Ramirez has no grandchildren. She attends new [employer]    ADVANCED DIRECTIVES: In place. The patient has named her daughter Tabitha Ramirez as her healthcare power of attorney. Tabitha Ramirez can be reached at [phone]. The healthcare power of attorney documents were brought by the patient 03/13/2013 and are separately scanned.   HEALTH MAINTENANCE: (Updated 06/30/2013) History  Substance Use Topics  . Smoking status: Former Smoker     Types: Cigarettes  . Smokeless tobacco: Never Used     Comment: smoked in college 1 yr   . Alcohol Use: No     Comment: Wine occasionally     Colonoscopy: August 2012  PAP:  Bone density: Never  Lipid panel: Dr. Jackelyn Knife   Allergies  Allergen Reactions  . Penicillins Rash    Current Outpatient Prescriptions  Medication Sig Dispense Refill  . Acetaminophen (TYLENOL EXTRA STRENGTH PO) Take 1 tablet by mouth as needed.      . Alum & Mag Hydroxide-Simeth (MAGIC MOUTHWASH W/LIDOCAINE) SOLN Take 5 mLs by mouth 4 (four) times daily as needed.  240 mL  1  . ciprofloxacin (CIPRO) 500 MG tablet Take 1 tablet (500 mg total) by mouth 2 (two) times daily.  14 tablet  1  . fluconazole (DIFLUCAN) 100 MG tablet 2 tabs by mouth x 1 day, then 1 tab by mouth daily for total of 7 days  8 tablet  2  . HYDROcodone-acetaminophen (NORCO/VICODIN) 5-325 MG per tablet       . lidocaine-prilocaine (EMLA) cream Apply 1 application  topically as needed (port access). Apply to port area 1-2 hours before chemotherapy; cover with plastioc wrap      . LORazepam (ATIVAN) 0.5 MG tablet Take 1 tablet (0.5 mg total) by mouth at bedtime as needed for anxiety.  20 tablet  0  . Multiple Vitamin (MULTIVITAMIN WITH MINERALS) TABS Take 1 tablet by mouth daily. LIIQUID - Takes 1 cap full daily      . ondansetron (ZOFRAN) 8 MG tablet Take 1 tablet (8 mg total) by mouth every 12 (twelve) hours as needed for nausea.  20 tablet  3  . pantoprazole (PROTONIX) 40 MG tablet Take 1 tablet (40 mg total) by mouth daily at 12 noon.  30 tablet  2  . prochlorperazine (COMPAZINE) 10 MG tablet Take 10 mg by mouth every 6 (six) hours as needed (nausea/anxiety).      . traMADol (ULTRAM) 50 MG tablet Take 1 tablet (50 mg total) by mouth every 6 (six) hours as needed for pain.  60 tablet  0  . triamterene-hydrochlorothiazide (MAXZIDE-25) 37.5-25 MG per tablet Take 0.5 tablets by mouth daily.       No current facility-administered medications for  this visit.   Facility-Administered Medications Ordered in Other Visits  Medication Dose Route Frequency Provider Last Rate Last Dose  . CARBOplatin (PARAPLATIN) 150 mg in sodium chloride 0.9 % 100 mL chemo infusion  150 mg Intravenous Once Anhthu Perdew G Dat Derksen, PA-C      . heparin lock flush 100 unit/mL  500 Units Intracatheter Once PRN Demarr Kluever Allegra Grana, PA-C      . PACLitaxel (TAXOL) 138 mg in dextrose 5 % 250 mL chemo infusion (</= 80mg /m2)  80 mg/m2 (Treatment Plan Actual) Intravenous Once Jaiven Graveline G Maisyn Nouri, PA-C      . sodium chloride 0.9 % injection 10 mL  10 mL Intracatheter PRN Yuleidy Rappleye Allegra Grana, PA-C        OBJECTIVE: Middle-aged [place] American woman in no acute distress Filed Vitals:   07/28/13 1042  BP: 111/67  Pulse: 116  Temp: 98.5 F (36.9 C)  Resp: 18     Body mass index is 25.39 kg/(m^2).    ECOG FS: 1 Filed Weights   07/28/13 1042  Weight: 148 lb (67.132 kg)   Physical Exam: HEENT:  Sclerae anicteric.  Oropharynx is notable for a white coating on the tongue and posterior buccal mucosa consistent with oropharyngeal candidiasis. The tongue itself is also hyperpigmented. There no visible ulcerations.  NODES:  No cervical or supraclavicular lymphadenopathy palpated.  BREAST EXAM: Deferred. Axillae are benign, with no palpable lymphadenopathy. LUNGS:  Clear to auscultation bilaterally.  No wheezes or rhonchi HEART:  Regular rate and rhythm.  ABDOMEN:  Soft, nontender.  Positive bowel sounds.  MSK:  No focal spinal tenderness to palpation. Full range of motion in the upper extremities. EXTREMITIES:  No peripheral edema.   SKIN:  Port is intact in the left upper chest wall with no erythema, edema, or evidence of infection. Nailbeds are hyperpigmented bilaterally in the upper extremities, but with no drainage or evidence of infection noted. NEURO:  Nonfocal. Well oriented. Positive  affect.   LAB RESULTS:   Lab Results  Component Value Date   WBC 4.1 07/28/2013   NEUTROABS 1.7 07/28/2013    HGB 10.7* 07/28/2013   HCT 31.1* 07/28/2013   MCV 98.1 07/28/2013   PLT 210 07/28/2013      Chemistry      Component Value Date/Time   NA 141 07/28/2013 1006  NA 141 04/30/2013 0530   K 3.4* 07/28/2013 1006   K 3.2* 04/30/2013 0530   CL 106 04/30/2013 0530   CL 100 03/13/2013 0918   CO2 23 07/28/2013 1006   CO2 26 04/30/2013 0530   BUN 22.6 07/28/2013 1006   BUN 5* 04/30/2013 0530   CREATININE 1.2* 07/28/2013 1006   CREATININE 0.75 04/30/2013 0530   CREATININE 0.94 11/29/2011 1535      Component Value Date/Time   CALCIUM 10.2 07/28/2013 1006   CALCIUM 8.7 04/30/2013 0530   ALKPHOS 81 07/28/2013 1006   ALKPHOS 96 02/28/2013 0937   AST 21 07/28/2013 1006   AST 27 02/28/2013 0937   ALT 26 07/28/2013 1006   ALT 27 02/28/2013 0937   BILITOT 0.94 07/28/2013 1006   BILITOT 0.3 02/28/2013 0937       STUDIES: No results found.    ASSESSMENT: 67 y.o. Tabitha Ramirez woman   (1)  status post right central breast biopsy 01/31/2013 for a clinical T2 NX, stage II invasive ductal carcinoma, grade 3, triple negative, with an MIB-1 of 84%  (2) biopsy of an enlarged (2.5 cm) right axillary lymph node 01/31/2013 was negative, felt possibly discordant  (3) sentinel lymph node sampling 03/10/2013 showed one of two sentinel nodes to be positive; repeat prognostic profile pending  (4) neoadjuvant doxorubicin and cyclophosphamide being given in dose dense fashion with Neulasta support starting 03/17/2013, followed by weekly carboplatin and paclitaxel x12, with first cycle on 05/27/2013.  (5)  status post hospitalization in early August 2014 secondary to a significant drop in hemoglobin leading to syncope and loss of consciousness.  (6)  Hx of symptomatic anemia, likely chemo-induced.  Status post blood transfusion, 2 units of packed red blood cells, on 06/10/2013. Now stable and being followed.  (7) Right elbow pain, resolved  (8)  Hx chemotherapy-induced afebrile neutropenia, resolved  (9) chemotherapy-induced peripheral  neuropathy, hands and feet, stable to improved  (10) oropharyngeal candidiasis, treated with Magic mouthwash and Diflucan   PLAN: Overall, I think Tharon is doing well. I see no evidence of worsening neuropathy, and we both feel comfortable with her proceeding with her eighth treatment of carboplatin/paclitaxel today as planned. Of course we'll continue to follow her very closely for any increased neuropathy.   She has again developed thrush which we will treat with oral Diflucan and Magic mouthwash. She's been given prescriptions for both of these medications.  We also discussed increasing dietary potassium with a slightly low potassium level today of 3.4. If dietary potassium does not improve her potassium level, we will consider a low dose of Klor-Con. We will repeat her metabolic panel next week.   I was hoping we would be able to forego Neupogen injections in the future. However, after treatment last week with no Neupogen injection her ANC has dropped significantly, from 12.0 one week ago on 07/21/2013 down to 1.7 today. I'm worried that if we do not proceed with Neupogen, Amandalynn will again become neutropenic and we will have to hold her chemotherapy once again. Accordingly, she is very much in agreement to coming back the next 2 days for low dose of Neupogen, days 2 and 3. I will see her again next week on November 10 for repeat labs and physical exam prior to her ninth weekly dose of carboplatin/paclitaxel.  If she continues to tolerate treatment well, her 12th and final dose would be on December 1. We will need to reschedule her breast MRI to be obtained after this final  dose rather than in November as is currently scheduled.  Jaylei and her daughter both voice understanding and agreement with this plan. They will call with any changes or problems.  Alisandra Son, PA-C   07/28/2013 12:57 PM

## 2013-07-28 NOTE — Patient Instructions (Signed)
[facility] Discharge Instructions for Patients Receiving Chemotherapy  Today you received the following chemotherapy agents Taxol and Carbo.  To help prevent nausea and vomiting after your treatment, we encourage you to take your nausea medication.   If you develop nausea and vomiting that is not controlled by your nausea medication, call the clinic.   BELOW ARE SYMPTOMS THAT SHOULD BE REPORTED IMMEDIATELY:  *FEVER GREATER THAN 100.5 F  *CHILLS WITH OR WITHOUT FEVER  NAUSEA AND VOMITING THAT IS NOT CONTROLLED WITH YOUR NAUSEA MEDICATION  *UNUSUAL SHORTNESS OF BREATH  *UNUSUAL BRUISING OR BLEEDING  TENDERNESS IN MOUTH AND THROAT WITH OR WITHOUT PRESENCE OF ULCERS  *URINARY PROBLEMS  *BOWEL PROBLEMS  UNUSUAL RASH Items with * indicate a potential emergency and should be followed up as soon as possible.  Feel free to call the clinic you have any questions or concerns. The clinic phone number is [phone].

## 2013-07-29 ENCOUNTER — Ambulatory Visit (HOSPITAL_BASED_OUTPATIENT_CLINIC_OR_DEPARTMENT_OTHER): Payer: Medicare Other

## 2013-07-29 VITALS — BP 114/67 | HR 101 | Temp 97.5°F

## 2013-07-29 DIAGNOSIS — C50119 Malignant neoplasm of central portion of unspecified female breast: Secondary | ICD-10-CM

## 2013-07-29 DIAGNOSIS — Z5189 Encounter for other specified aftercare: Secondary | ICD-10-CM

## 2013-07-29 DIAGNOSIS — C50111 Malignant neoplasm of central portion of right female breast: Secondary | ICD-10-CM

## 2013-07-29 MED ORDER — FILGRASTIM 300 MCG/0.5ML IJ SOLN
300.0000 ug | Freq: Once | INTRAMUSCULAR | Status: AC
  Administered 2013-07-29: 300 ug via SUBCUTANEOUS
  Filled 2013-07-29: qty 0.5

## 2013-07-30 ENCOUNTER — Ambulatory Visit (HOSPITAL_BASED_OUTPATIENT_CLINIC_OR_DEPARTMENT_OTHER): Payer: Medicare Other

## 2013-07-30 VITALS — BP 108/56 | HR 98 | Temp 98.4°F

## 2013-07-30 DIAGNOSIS — Z5189 Encounter for other specified aftercare: Secondary | ICD-10-CM

## 2013-07-30 DIAGNOSIS — C50111 Malignant neoplasm of central portion of right female breast: Secondary | ICD-10-CM

## 2013-07-30 DIAGNOSIS — C50119 Malignant neoplasm of central portion of unspecified female breast: Secondary | ICD-10-CM

## 2013-07-30 MED ORDER — FILGRASTIM 300 MCG/0.5ML IJ SOLN
300.0000 ug | Freq: Once | INTRAMUSCULAR | Status: AC
  Administered 2013-07-30: 300 ug via SUBCUTANEOUS
  Filled 2013-07-30: qty 0.5

## 2013-07-31 ENCOUNTER — Other Ambulatory Visit: Payer: Self-pay

## 2013-08-04 ENCOUNTER — Ambulatory Visit (HOSPITAL_BASED_OUTPATIENT_CLINIC_OR_DEPARTMENT_OTHER): Payer: Medicare Other | Admitting: Physician Assistant

## 2013-08-04 ENCOUNTER — Ambulatory Visit (HOSPITAL_BASED_OUTPATIENT_CLINIC_OR_DEPARTMENT_OTHER): Payer: Medicare Other

## 2013-08-04 ENCOUNTER — Encounter: Payer: Self-pay | Admitting: Physician Assistant

## 2013-08-04 ENCOUNTER — Other Ambulatory Visit (HOSPITAL_BASED_OUTPATIENT_CLINIC_OR_DEPARTMENT_OTHER): Payer: Medicare Other | Admitting: Lab

## 2013-08-04 ENCOUNTER — Telehealth: Payer: Self-pay | Admitting: *Deleted

## 2013-08-04 VITALS — BP 117/74 | HR 107 | Temp 97.8°F | Resp 20 | Ht 64.0 in | Wt 149.4 lb

## 2013-08-04 DIAGNOSIS — C50119 Malignant neoplasm of central portion of unspecified female breast: Secondary | ICD-10-CM

## 2013-08-04 DIAGNOSIS — Z5111 Encounter for antineoplastic chemotherapy: Secondary | ICD-10-CM

## 2013-08-04 DIAGNOSIS — E876 Hypokalemia: Secondary | ICD-10-CM

## 2013-08-04 DIAGNOSIS — D649 Anemia, unspecified: Secondary | ICD-10-CM

## 2013-08-04 DIAGNOSIS — G620 Drug-induced polyneuropathy: Secondary | ICD-10-CM

## 2013-08-04 DIAGNOSIS — C50111 Malignant neoplasm of central portion of right female breast: Secondary | ICD-10-CM

## 2013-08-04 DIAGNOSIS — R6884 Jaw pain: Secondary | ICD-10-CM | POA: Insufficient documentation

## 2013-08-04 DIAGNOSIS — Z862 Personal history of diseases of the blood and blood-forming organs and certain disorders involving the immune mechanism: Secondary | ICD-10-CM

## 2013-08-04 DIAGNOSIS — B370 Candidal stomatitis: Secondary | ICD-10-CM

## 2013-08-04 LAB — CBC WITH DIFFERENTIAL/PLATELET
BASO%: 0.8 % (ref 0.0–2.0)
Basophils Absolute: 0.1 10*3/uL (ref 0.0–0.1)
Eosinophils Absolute: 0.1 10*3/uL (ref 0.0–0.5)
HCT: 28.1 % — ABNORMAL LOW (ref 34.8–46.6)
LYMPH%: 41 % (ref 14.0–49.7)
MCV: 96.2 fL (ref 79.5–101.0)
MONO#: 1.2 10*3/uL — ABNORMAL HIGH (ref 0.1–0.9)
MONO%: 15.2 % — ABNORMAL HIGH (ref 0.0–14.0)
NEUT#: 3.2 10*3/uL (ref 1.5–6.5)
NEUT%: 42.1 % (ref 38.4–76.8)
Platelets: 278 10*3/uL (ref 145–400)
RBC: 2.92 10*6/uL — ABNORMAL LOW (ref 3.70–5.45)
RDW: 19.3 % — ABNORMAL HIGH (ref 11.2–14.5)
WBC: 7.7 10*3/uL (ref 3.9–10.3)

## 2013-08-04 LAB — COMPREHENSIVE METABOLIC PANEL (CC13)
ALT: 24 U/L (ref 0–55)
AST: 23 U/L (ref 5–34)
Alkaline Phosphatase: 97 U/L (ref 40–150)
Anion Gap: 12 mEq/L — ABNORMAL HIGH (ref 3–11)
BUN: 11.9 mg/dL (ref 7.0–26.0)
CO2: 24 mEq/L (ref 22–29)
Calcium: 10.4 mg/dL (ref 8.4–10.4)
Chloride: 103 mEq/L (ref 98–109)
Creatinine: 0.9 mg/dL (ref 0.6–1.1)
Glucose: 95 mg/dl (ref 70–140)
Total Bilirubin: 0.27 mg/dL (ref 0.20–1.20)

## 2013-08-04 MED ORDER — SODIUM CHLORIDE 0.9 % IV SOLN
Freq: Once | INTRAVENOUS | Status: AC
  Administered 2013-08-04: 12:00:00 via INTRAVENOUS

## 2013-08-04 MED ORDER — DIPHENHYDRAMINE HCL 50 MG/ML IJ SOLN
INTRAMUSCULAR | Status: AC
  Filled 2013-08-04: qty 1

## 2013-08-04 MED ORDER — SODIUM CHLORIDE 0.9 % IV SOLN
150.2000 mg | Freq: Once | INTRAVENOUS | Status: AC
  Administered 2013-08-04: 150 mg via INTRAVENOUS
  Filled 2013-08-04: qty 15

## 2013-08-04 MED ORDER — DEXAMETHASONE SODIUM PHOSPHATE 20 MG/5ML IJ SOLN
20.0000 mg | Freq: Once | INTRAMUSCULAR | Status: AC
  Administered 2013-08-04: 20 mg via INTRAVENOUS

## 2013-08-04 MED ORDER — ONDANSETRON 16 MG/50ML IVPB (CHCC)
16.0000 mg | Freq: Once | INTRAVENOUS | Status: AC
  Administered 2013-08-04: 16 mg via INTRAVENOUS

## 2013-08-04 MED ORDER — ONDANSETRON 16 MG/50ML IVPB (CHCC)
INTRAVENOUS | Status: AC
  Filled 2013-08-04: qty 16

## 2013-08-04 MED ORDER — FAMOTIDINE IN NACL 20-0.9 MG/50ML-% IV SOLN
20.0000 mg | Freq: Once | INTRAVENOUS | Status: AC
  Administered 2013-08-04: 20 mg via INTRAVENOUS

## 2013-08-04 MED ORDER — PACLITAXEL CHEMO INJECTION 300 MG/50ML
80.0000 mg/m2 | Freq: Once | INTRAVENOUS | Status: AC
  Administered 2013-08-04: 138 mg via INTRAVENOUS
  Filled 2013-08-04: qty 23

## 2013-08-04 MED ORDER — SODIUM CHLORIDE 0.9 % IJ SOLN
10.0000 mL | INTRAMUSCULAR | Status: DC | PRN
  Administered 2013-08-04: 10 mL
  Filled 2013-08-04: qty 10

## 2013-08-04 MED ORDER — HEPARIN SOD (PORK) LOCK FLUSH 100 UNIT/ML IV SOLN
500.0000 [IU] | Freq: Once | INTRAVENOUS | Status: AC | PRN
  Administered 2013-08-04: 500 [IU]
  Filled 2013-08-04: qty 5

## 2013-08-04 MED ORDER — DIPHENHYDRAMINE HCL 50 MG/ML IJ SOLN
50.0000 mg | Freq: Once | INTRAMUSCULAR | Status: AC
  Administered 2013-08-04: 50 mg via INTRAVENOUS

## 2013-08-04 MED ORDER — FAMOTIDINE IN NACL 20-0.9 MG/50ML-% IV SOLN
INTRAVENOUS | Status: AC
  Filled 2013-08-04: qty 50

## 2013-08-04 MED ORDER — DEXAMETHASONE SODIUM PHOSPHATE 20 MG/5ML IJ SOLN
INTRAMUSCULAR | Status: AC
  Filled 2013-08-04: qty 5

## 2013-08-04 NOTE — Patient Instructions (Signed)
[facility] Discharge Instructions for Patients Receiving Chemotherapy  Today you received the following chemotherapy agents taxol and carboplatin.  To help prevent nausea and vomiting after your treatment, we encourage you to take your nausea medication as prescribed.   If you develop nausea and vomiting that is not controlled by your nausea medication, call the clinic.   BELOW ARE SYMPTOMS THAT SHOULD BE REPORTED IMMEDIATELY:  *FEVER GREATER THAN 100.5 F  *CHILLS WITH OR WITHOUT FEVER  NAUSEA AND VOMITING THAT IS NOT CONTROLLED WITH YOUR NAUSEA MEDICATION  *UNUSUAL SHORTNESS OF BREATH  *UNUSUAL BRUISING OR BLEEDING  TENDERNESS IN MOUTH AND THROAT WITH OR WITHOUT PRESENCE OF ULCERS  *URINARY PROBLEMS  *BOWEL PROBLEMS  UNUSUAL RASH Items with * indicate a potential emergency and should be followed up as soon as possible.  Feel free to call the clinic you have any questions or concerns. The clinic phone number is [phone].    

## 2013-08-04 NOTE — Progress Notes (Signed)
ID: Tabitha Ramirez OB: 11-10-1945  MR#: [id number]  CSN#:[id number]  PCP: Zenaida Niece, MD GYN:  Lavina Hamman SU: Claud Kelp OTHER MD: Lurline Hare  CHIEF COMPLAINT:  Right Breast Cancer/Neoadjuvant chemotherapy   HISTORY OF PRESENT ILLNESS: Tabitha Ramirez tells me she felt a mass in her right breast about 3 months ago but initially ignored it. She eventually brought it to her physician's attention and was set up for bilateral diagnostic mammography at the [facility] Center 01/31/2013. This showed an area of distortion in the upper outer quadrant measuring approximately 4.3 cm. Diffuse calcifications in both breasts were stable. The mass was palpable at 12:00, 3 cm from the nipple, and by ultrasound it was irregular, hypoechoic, and measured 2.5 cm. In addition there was prominent right axillary adenopathy the largest lymph node measuring 2.5 cm.  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 Biopsy of the lymph node was negative. It did show some lymph node tissue.  Bilateral breast MRI 02/10/2013 showed a ring-enhancing necrotic-appearing 3.4 cm mass in the anterior third of the right breast. There was also a 2.0 cm level one right axillary lymph node that was prominent, but maintains central fat. The left breast the left axilla were benign and there was no internal mammary adenopathy noted.  The patient's subsequent history is as detailed below.  INTERVAL HISTORY: Tabitha Ramirez returns alone today  for followup of her locally advanced right breast carcinoma. She is due for her ninth of 12 planned weekly doses of carbo/paclitaxel today.  Tabitha Ramirez's biggest problem has been some intermittent peripheral neuropathy. Fortunately, she tells me it is entirely stable today, with no worsening since her visit here last week. She still has some slight numbness in the balls of her feet  bilaterally, but this is not affecting her walking. She has slight numbness in the very tips of her fingers, but is able to perform fine motor skills without difficulty.   Tabitha Ramirez has also developed some left-sided jaw pain, consistent with TMJ.  The joint feels "tight" and sore. She finds that moist heat helps. Of course due to a history of GI bleed, we do not recommend she take anti-inflammatories. She does feel like the moist heat has helped significantly, and she is eating a soft diet.  REVIEW OF SYSTEMS: Tabitha Ramirez is well overall, and her energy level is good.   She denies any fevers or chills. She still has some hyperpigmentation of the skin and also the nailbeds, although she denies nailbed sensitivity, drainage or evidence of infection.  She denies any abnormal bruising and had no signs of abnormal bleeding, specifically no evidence of blood in the stool and no dark tarry stools. Her mouth is no longer sore, and the thrush seems to be well-controlled. She completed a course of Diflucan and is also using Magic mouthwash as needed. She's eating and drinking fairly well and has had no nausea or change in bowel or bladder habits.   She's had no increased cough, phlegm production, shortness of breath, chest pain, or palpitations. She denies any abnormal headaches, change in vision or dizziness. She currently denies any unusual myalgias, arthralgias, or bony pain.  She's had no peripheral swelling.  A detailed review of systems is otherwise stable and noncontributory.   PAST MEDICAL HISTORY: Past Medical History  Diagnosis Date  . Heart murmur   . Hypertension   . Anemia   .  Allergy   . Arthritis   . Breast cancer   . Migraines     PAST SURGICAL HISTORY: Past Surgical History  Procedure Laterality Date  . Abdominal hysterectomy      With bilateral salpingo-oophorectomy  . Portacath placement N/A 03/10/2013    Procedure: INSERTION PORT-A-CATH WITH FLUOROSCOPY AND ULTRASOUND;  Surgeon: Ernestene Mention, MD;  Location: [facility] OR;  Service: General;  Laterality: N/A;  . Axillary lymph node biopsy Right 03/10/2013    Procedure: AXILLARY LYMPH NODE BIOPSY;  Surgeon: Ernestene Mention, MD;  Location: [facility] OR;  Service: General;  Laterality: Right;  sentinel node with blue dye  . Esophagogastroduodenoscopy N/A 04/28/2013    Procedure: ESOPHAGOGASTRODUODENOSCOPY (EGD);  Surgeon: Graylin Shiver, MD;  Location: [facility];  Service: Endoscopy;  Laterality: N/A;  . Colonoscopy N/A 04/30/2013    Procedure: COLONOSCOPY;  Surgeon: Graylin Shiver, MD;  Location: [facility] ENDOSCOPY;  Service: Endoscopy;  Laterality: N/A;    FAMILY HISTORY Family History  Problem Relation Age of Onset  . Cancer Mother 74    Unknown type of cancer  . Congestive Heart Failure Mother   . Colon cancer Brother   . Diabetes Brother 71  . Heart disease Brother     AMI 05/22/2012  . Congestive Heart Failure Brother   . Cancer Maternal Grandmother 61    Unknown type of cancer   the patient's father died in an automobile accident at the age of 82. The patient's mother lived to be 28. Tabitha Ramirez has 3 brothers, no sisters. One brother had colon cancer at the age of 48. The patient's mother and the patient's mothers mother (maternal grandmother) both were diagnosed with some kind of cancer at the age of 34 or thereabouts, but Tabitha Ramirez does not know what type of cancer this may have been.  GYNECOLOGIC HISTORY:  Menarche age 83, first live birth age 26. The patient had her hysterectomy in the 1970s. She has been on estrogen replacement for she thinks 18 years.  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 She is divorced, lives by herself, with no pets. Her first child was given up for adoption. Her second child, Tabitha Ramirez, is a Geophysicist/field seismologist and is working towards a PhD at [employer]. [name] has no grandchildren. She  attends new [employer]    ADVANCED DIRECTIVES: In place. The patient has named her daughter Tabitha Ramirez as her healthcare power of attorney. Tabitha Ramirez can be reached at [phone]. The healthcare power of attorney documents were brought by the patient 03/13/2013 and are separately scanned.   HEALTH MAINTENANCE: (Updated 06/30/2013) History  Substance Use Topics  . Smoking status: Former Smoker    Types: Cigarettes  . Smokeless tobacco: Never Used     Comment: smoked in college 1 yr   . Alcohol Use: No     Comment: Wine occasionally     Colonoscopy: August 2012  PAP:  Bone density: Never  Lipid panel: Dr. Jackelyn Knife    Allergies  Allergen Reactions  . Penicillins Rash    Current Outpatient Prescriptions  Medication Sig Dispense Refill  . Acetaminophen (TYLENOL EXTRA STRENGTH PO) Take 1 tablet by mouth as needed.      . Alum & Mag Hydroxide-Simeth (MAGIC MOUTHWASH W/LIDOCAINE) SOLN Take 5 mLs by mouth 4 (four) times daily as needed.  240 mL  1  . lidocaine-prilocaine (  EMLA) cream Apply 1 application topically as needed (port access). Apply to port area 1-2 hours before chemotherapy; cover with plastioc wrap      . LORazepam (ATIVAN) 0.5 MG tablet Take 1 tablet (0.5 mg total) by mouth at bedtime as needed for anxiety.  20 tablet  0  . Multiple Vitamin (MULTIVITAMIN WITH MINERALS) TABS Take 1 tablet by mouth daily. LIIQUID - Takes 1 cap full daily      . ondansetron (ZOFRAN) 8 MG tablet Take 1 tablet (8 mg total) by mouth every 12 (twelve) hours as needed for nausea.  20 tablet  3  . pantoprazole (PROTONIX) 40 MG tablet Take 1 tablet (40 mg total) by mouth daily at 12 noon.  30 tablet  2  . prochlorperazine (COMPAZINE) 10 MG tablet Take 10 mg by mouth every 6 (six) hours as needed (nausea/anxiety).      . traMADol (ULTRAM) 50 MG tablet Take 1 tablet (50 mg total) by mouth every 6 (six) hours as needed for pain.  60 tablet  0  . triamterene-hydrochlorothiazide  (MAXZIDE-25) 37.5-25 MG per tablet Take 0.5 tablets by mouth daily.      . ciprofloxacin (CIPRO) 500 MG tablet Take 1 tablet (500 mg total) by mouth 2 (two) times daily.  14 tablet  1  . fluconazole (DIFLUCAN) 100 MG tablet 2 tabs by mouth x 1 day, then 1 tab by mouth daily for total of 7 days  8 tablet  2  . HYDROcodone-acetaminophen (NORCO/VICODIN) 5-325 MG per tablet        No current facility-administered medications for this visit.    OBJECTIVE: Middle-aged [place] American woman in no acute distress Filed Vitals:   08/04/13 1047  BP: 117/74  Pulse: 107  Temp: 97.8 F (36.6 C)  Resp: 20     Body mass index is 25.63 kg/(m^2).    ECOG FS: 1 Filed Weights   08/04/13 1047  Weight: 149 lb 6.4 oz (67.767 kg)   Physical Exam: HEENT:  Sclerae anicteric.  Oropharynx is unremarkable. The tongue itself is slightly hyperpigmented, but there is no evidence of candidiasis, and no visible ulcerations today. NODES:  No cervical or supraclavicular lymphadenopathy palpated.  BREAST EXAM: Deferred. Axillae are benign, with no palpable lymphadenopathy. LUNGS:  Clear to auscultation bilaterally.  No wheezes or rhonchi HEART:  Regular rate and rhythm. No murmur appreciated. ABDOMEN:  Soft, nontender.  Positive bowel sounds.  MSK:  No focal spinal tenderness to palpation. Full range of motion in the upper extremities. No joint swelling. There is some tenderness to palpation of the left temporomandibular joint, and some reduction in range of motion due to discomfort. EXTREMITIES:  No peripheral edema.   SKIN:  Port is intact in the left upper chest wall with no erythema, edema, or evidence of infection. Nailbeds are hyperpigmented bilaterally in the upper extremities, but with no drainage or evidence of infection noted. NEURO:  Nonfocal. Well oriented. Positive  affect.   LAB RESULTS:   Lab Results  Component Value Date   WBC 7.7 08/04/2013   NEUTROABS 3.2 08/04/2013   HGB 9.6* 08/04/2013    HCT 28.1* 08/04/2013   MCV 96.2 08/04/2013   PLT 278 08/04/2013      Chemistry      Component Value Date/Time   NA 141 07/28/2013 1006   NA 141 04/30/2013 0530   K 3.4* 07/28/2013 1006   K 3.2* 04/30/2013 0530   CL 106 04/30/2013 0530   CL 100 03/13/2013 5409  CO2 23 07/28/2013 1006   CO2 26 04/30/2013 0530   BUN 22.6 07/28/2013 1006   BUN 5* 04/30/2013 0530   CREATININE 1.2* 07/28/2013 1006   CREATININE 0.75 04/30/2013 0530   CREATININE 0.94 11/29/2011 1535      Component Value Date/Time   CALCIUM 10.2 07/28/2013 1006   CALCIUM 8.7 04/30/2013 0530   ALKPHOS 81 07/28/2013 1006   ALKPHOS 96 02/28/2013 0937   AST 21 07/28/2013 1006   AST 27 02/28/2013 0937   ALT 26 07/28/2013 1006   ALT 27 02/28/2013 0937   BILITOT 0.94 07/28/2013 1006   BILITOT 0.3 02/28/2013 0937       STUDIES: No results found.    ASSESSMENT: 67 y.o. [place] woman   (1)  status post right central breast biopsy 01/31/2013 for a clinical T2 NX, stage II invasive ductal carcinoma, grade 3, triple negative, with an MIB-1 of 84%  (2) biopsy of an enlarged (2.5 cm) right axillary lymph node 01/31/2013 was negative, felt possibly discordant  (3) sentinel lymph node sampling 03/10/2013 showed one of two sentinel nodes to be positive; repeat prognostic profile pending  (4) neoadjuvant doxorubicin and cyclophosphamide being given in dose dense fashion with Neulasta support starting 03/17/2013, followed by weekly carboplatin and paclitaxel x12, with first cycle on 05/27/2013.  (5)  status post hospitalization in early August 2014 secondary to a significant drop in hemoglobin leading to syncope and loss of consciousness.  (6)  Hx of symptomatic anemia, likely chemo-induced.  Status post blood transfusion, 2 units of packed red blood cells, on 06/10/2013. Now stable and being followed.  (7) Right elbow pain, resolved  (8)  Hx chemotherapy-induced afebrile neutropenia, resolved  (9) chemotherapy-induced peripheral neuropathy, hands  and feet, stable to improved  (10) oropharyngeal candidiasis, treated with Magic mouthwash and Diflucan   PLAN: Zaleigh continues to tolerate the treatment well, her neuropathy appears to be stable, and we will proceed as scheduled for her ninth weekly dose of carboplatin/paclitaxel today. Of course we'll continue to follow her very closely for increased signs of neuropathy.   We will continue with her current regimen which consists of Neupogen on days 2 and 3 of each cycle since this seems to be controlling her neutropenia extremely well, and has prevented any additional delays in treatment thus far. If all goes as expected, her 12th and final weekly dose of chemotherapy will be December 1. I have requested that they reschedule her breast MRI from November to approximately December 3 after the completion of her neoadjuvant chemotherapy. She'll be seeing both Dr. Derrell Lolling and Dr. Darnelle Catalan at that point to review the MRI results and discuss her definitive surgery.  All this was reviewed in detail with [name] today. She voices understanding and agreement with our plan and she will call with any changes or problems prior to her appointment here with me next week.   Anisa Leanos, PA-C   08/04/2013 11:18 AM

## 2013-08-04 NOTE — Telephone Encounter (Signed)
appts made and printed...td 

## 2013-08-05 ENCOUNTER — Other Ambulatory Visit: Payer: Self-pay | Admitting: Physician Assistant

## 2013-08-05 ENCOUNTER — Ambulatory Visit (HOSPITAL_BASED_OUTPATIENT_CLINIC_OR_DEPARTMENT_OTHER): Payer: Medicare Other

## 2013-08-05 VITALS — BP 125/70 | HR 97 | Temp 98.4°F | Resp 18

## 2013-08-05 DIAGNOSIS — C50119 Malignant neoplasm of central portion of unspecified female breast: Secondary | ICD-10-CM

## 2013-08-05 DIAGNOSIS — Z5189 Encounter for other specified aftercare: Secondary | ICD-10-CM

## 2013-08-05 DIAGNOSIS — Z862 Personal history of diseases of the blood and blood-forming organs and certain disorders involving the immune mechanism: Secondary | ICD-10-CM

## 2013-08-05 DIAGNOSIS — C50111 Malignant neoplasm of central portion of right female breast: Secondary | ICD-10-CM

## 2013-08-05 MED ORDER — FILGRASTIM 300 MCG/0.5ML IJ SOLN
300.0000 ug | Freq: Once | INTRAMUSCULAR | Status: AC
  Administered 2013-08-05: 300 ug via SUBCUTANEOUS
  Filled 2013-08-05: qty 0.5

## 2013-08-06 ENCOUNTER — Ambulatory Visit (HOSPITAL_BASED_OUTPATIENT_CLINIC_OR_DEPARTMENT_OTHER): Payer: Medicare Other

## 2013-08-06 VITALS — BP 113/65 | HR 101 | Temp 98.1°F

## 2013-08-06 DIAGNOSIS — Z5189 Encounter for other specified aftercare: Secondary | ICD-10-CM

## 2013-08-06 DIAGNOSIS — C50112 Malignant neoplasm of central portion of left female breast: Secondary | ICD-10-CM

## 2013-08-06 DIAGNOSIS — C50119 Malignant neoplasm of central portion of unspecified female breast: Secondary | ICD-10-CM

## 2013-08-06 DIAGNOSIS — C50111 Malignant neoplasm of central portion of right female breast: Secondary | ICD-10-CM

## 2013-08-06 DIAGNOSIS — Z862 Personal history of diseases of the blood and blood-forming organs and certain disorders involving the immune mechanism: Secondary | ICD-10-CM

## 2013-08-06 MED ORDER — FILGRASTIM 300 MCG/0.5ML IJ SOLN
300.0000 ug | Freq: Once | INTRAMUSCULAR | Status: AC
  Administered 2013-08-06: 300 ug via SUBCUTANEOUS
  Filled 2013-08-06: qty 0.5

## 2013-08-11 ENCOUNTER — Encounter: Payer: Self-pay | Admitting: Physician Assistant

## 2013-08-11 ENCOUNTER — Other Ambulatory Visit (HOSPITAL_BASED_OUTPATIENT_CLINIC_OR_DEPARTMENT_OTHER): Payer: Medicare Other

## 2013-08-11 ENCOUNTER — Ambulatory Visit (HOSPITAL_BASED_OUTPATIENT_CLINIC_OR_DEPARTMENT_OTHER): Payer: Medicare Other | Admitting: Physician Assistant

## 2013-08-11 ENCOUNTER — Ambulatory Visit (HOSPITAL_BASED_OUTPATIENT_CLINIC_OR_DEPARTMENT_OTHER): Payer: Medicare Other

## 2013-08-11 VITALS — BP 111/71 | Ht 64.0 in | Wt 145.0 lb

## 2013-08-11 DIAGNOSIS — M25521 Pain in right elbow: Secondary | ICD-10-CM

## 2013-08-11 DIAGNOSIS — R6884 Jaw pain: Secondary | ICD-10-CM

## 2013-08-11 DIAGNOSIS — C50119 Malignant neoplasm of central portion of unspecified female breast: Secondary | ICD-10-CM

## 2013-08-11 DIAGNOSIS — D649 Anemia, unspecified: Secondary | ICD-10-CM

## 2013-08-11 DIAGNOSIS — G620 Drug-induced polyneuropathy: Secondary | ICD-10-CM

## 2013-08-11 DIAGNOSIS — C50111 Malignant neoplasm of central portion of right female breast: Secondary | ICD-10-CM

## 2013-08-11 DIAGNOSIS — Z5111 Encounter for antineoplastic chemotherapy: Secondary | ICD-10-CM

## 2013-08-11 DIAGNOSIS — M25529 Pain in unspecified elbow: Secondary | ICD-10-CM

## 2013-08-11 DIAGNOSIS — Z171 Estrogen receptor negative status [ER-]: Secondary | ICD-10-CM

## 2013-08-11 LAB — COMPREHENSIVE METABOLIC PANEL (CC13)
ALT: 22 U/L (ref 0–55)
AST: 21 U/L (ref 5–34)
Albumin: 3.8 g/dL (ref 3.5–5.0)
Alkaline Phosphatase: 93 U/L (ref 40–150)
BUN: 15.9 mg/dL (ref 7.0–26.0)
Glucose: 119 mg/dl (ref 70–140)
Sodium: 138 mEq/L (ref 136–145)
Total Bilirubin: 0.46 mg/dL (ref 0.20–1.20)
Total Protein: 7.2 g/dL (ref 6.4–8.3)

## 2013-08-11 LAB — CBC WITH DIFFERENTIAL/PLATELET
BASO%: 1.1 % (ref 0.0–2.0)
Eosinophils Absolute: 0 10*3/uL (ref 0.0–0.5)
HCT: 27 % — ABNORMAL LOW (ref 34.8–46.6)
HGB: 9.3 g/dL — ABNORMAL LOW (ref 11.6–15.9)
LYMPH%: 37.3 % (ref 14.0–49.7)
MCH: 34.3 pg — ABNORMAL HIGH (ref 25.1–34.0)
MONO#: 0.7 10*3/uL (ref 0.1–0.9)
NEUT#: 2 10*3/uL (ref 1.5–6.5)
NEUT%: 45.8 % (ref 38.4–76.8)
Platelets: 264 10*3/uL (ref 145–400)
WBC: 4.3 10*3/uL (ref 3.9–10.3)
lymph#: 1.6 10*3/uL (ref 0.9–3.3)

## 2013-08-11 MED ORDER — ONDANSETRON 16 MG/50ML IVPB (CHCC)
INTRAVENOUS | Status: AC
  Filled 2013-08-11: qty 16

## 2013-08-11 MED ORDER — FAMOTIDINE IN NACL 20-0.9 MG/50ML-% IV SOLN
20.0000 mg | Freq: Once | INTRAVENOUS | Status: AC
  Administered 2013-08-11: 20 mg via INTRAVENOUS

## 2013-08-11 MED ORDER — SODIUM CHLORIDE 0.9 % IJ SOLN
10.0000 mL | INTRAMUSCULAR | Status: DC | PRN
  Administered 2013-08-11: 10 mL
  Filled 2013-08-11: qty 10

## 2013-08-11 MED ORDER — DIPHENHYDRAMINE HCL 50 MG/ML IJ SOLN
INTRAMUSCULAR | Status: AC
  Filled 2013-08-11: qty 1

## 2013-08-11 MED ORDER — DEXAMETHASONE SODIUM PHOSPHATE 20 MG/5ML IJ SOLN
20.0000 mg | Freq: Once | INTRAMUSCULAR | Status: AC
  Administered 2013-08-11: 20 mg via INTRAVENOUS

## 2013-08-11 MED ORDER — SODIUM CHLORIDE 0.9 % IV SOLN
Freq: Once | INTRAVENOUS | Status: AC
  Administered 2013-08-11: 13:00:00 via INTRAVENOUS

## 2013-08-11 MED ORDER — HEPARIN SOD (PORK) LOCK FLUSH 100 UNIT/ML IV SOLN
500.0000 [IU] | Freq: Once | INTRAVENOUS | Status: AC | PRN
  Administered 2013-08-11: 500 [IU]
  Filled 2013-08-11: qty 5

## 2013-08-11 MED ORDER — HYDROCODONE-ACETAMINOPHEN 5-325 MG PO TABS: 1.0000 | ORAL_TABLET | Freq: Three times a day (TID) | ORAL | Status: DC | PRN

## 2013-08-11 MED ORDER — DIPHENHYDRAMINE HCL 50 MG/ML IJ SOLN
50.0000 mg | Freq: Once | INTRAMUSCULAR | Status: AC
  Administered 2013-08-11: 50 mg via INTRAVENOUS

## 2013-08-11 MED ORDER — FAMOTIDINE IN NACL 20-0.9 MG/50ML-% IV SOLN
INTRAVENOUS | Status: AC
  Filled 2013-08-11: qty 50

## 2013-08-11 MED ORDER — PACLITAXEL CHEMO INJECTION 300 MG/50ML
80.0000 mg/m2 | Freq: Once | INTRAVENOUS | Status: AC
  Administered 2013-08-11: 138 mg via INTRAVENOUS
  Filled 2013-08-11: qty 23

## 2013-08-11 MED ORDER — ONDANSETRON 16 MG/50ML IVPB (CHCC)
16.0000 mg | Freq: Once | INTRAVENOUS | Status: AC
  Administered 2013-08-11: 16 mg via INTRAVENOUS

## 2013-08-11 MED ORDER — DEXAMETHASONE SODIUM PHOSPHATE 20 MG/5ML IJ SOLN
INTRAMUSCULAR | Status: AC
  Filled 2013-08-11: qty 5

## 2013-08-11 NOTE — Progress Notes (Addendum)
Carboplatin not released today prior to pt discharge. Dr. Darnelle Catalan notified and okay with patient not receiving Carboplatin today ( Day 1 Cycle 10). Pt was called and notified that she did not receive Carboplatin and  that Dr Darnelle Catalan was okay with her not receiving it this cycle. Pt was also given the option to call clinic in the morning and speak to Dr Darnelle Catalan if she has any questions or concerns about her treatment. Pt verbalized understanding of all.  Pt stated she was in agreement with not receiving the Carboplatin.

## 2013-08-11 NOTE — Progress Notes (Signed)
ID: Tabitha Ramirez OB: June 09, 1946  MR#: [id number]  CSN#:[id number]  PCP: Zenaida Niece, MD GYN:  Lavina Hamman, MD SU: Claud Kelp, MD OTHER MD: Lurline Hare, MD  CHIEF COMPLAINT:  Right Breast Cancer/Neoadjuvant chemotherapy   HISTORY OF PRESENT ILLNESS: Tabitha Ramirez tells me she felt a mass in her right breast about 3 months ago but initially ignored it. She eventually brought it to her physician's attention and was set up for bilateral diagnostic mammography at the [facility] Center 01/31/2013. This showed an area of distortion in the upper outer quadrant measuring approximately 4.3 cm. Diffuse calcifications in both breasts were stable. The mass was palpable at 12:00, 3 cm from the nipple, and by ultrasound it was irregular, hypoechoic, and measured 2.5 cm. In addition there was prominent right axillary adenopathy the largest lymph node measuring 2.5 cm.  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 Biopsy of the lymph node was negative. It did show some lymph node tissue.  Bilateral breast MRI 02/10/2013 showed a ring-enhancing necrotic-appearing 3.4 cm mass in the anterior third of the right breast. There was also a 2.0 cm level one right axillary lymph node that was prominent, but maintains central fat. The left breast the left axilla were benign and there was no internal mammary adenopathy noted.  The patient's subsequent history is as detailed below.  INTERVAL HISTORY: Tabitha Ramirez returns today accompanied by her daughter Tabitha Ramirez for followup of her locally advanced right breast carcinoma. She is due for her 10th of 12 planned weekly doses of carbo/paclitaxel today.  She is actually tolerated the last several cycles of carboplatin/paclitaxel well, and continues to receive Neupogen injections for 2 days after each dose. Her white counts have remained stable,  although her hemoglobin is slowly dropping.    Tabitha Ramirez has had problems in the past with peripheral neuropathy, but tells me it is completely stable today. She's having intermittent numbness and tingling isolated to the right from, with the remainder of the upper extremities being spared. She continues to have some numbness bilaterally in the balls of her feet, but this has not worsened, remained stable, and is not affecting any of her walking or day-to-day activities.   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 She cannot take anti-inflammatory medications due to a history of GI bleed, so she is using moist heat on the area. She does take hydrocodone/APAP for the discomfort and needs a refill on that today. We also discussed the possibility of a referral for physical therapy in the future.    REVIEW OF SYSTEMS: Tabitha Ramirez  denies any fevers, chills, or night sweats. She occasionally has some difficulty sleeping, but her energy level is still good. She's had no mouth ulcers or oral sensitivity. Her appetite is fairly good. She is keeping herself well hydrated, and she denies any problems with nausea, emesis, or change in bowel or bladder habits. She's had no signs of abnormal bleeding, and specifically denies any evidence of GI bleed, with no blood in the stool or evidence of dark tarry stools.  She denies any increased cough, shortness of breath, chest pain, or palpitations. She still has some pain intermittently in the right elbow, but denies any new or unusual myalgias, arthralgias, or bony pain otherwise. She's had no peripheral swelling. She denies any abnormal headaches or dizziness.  A detailed review of systems is otherwise  stable and noncontributory.   PAST MEDICAL HISTORY: Past Medical History  Diagnosis Date  . Heart murmur   . Hypertension   . Anemia   . Allergy   . Arthritis   . Breast cancer   . Migraines   . Cancer     PAST SURGICAL HISTORY: Past Surgical History   Procedure Laterality Date  . Abdominal hysterectomy      With bilateral salpingo-oophorectomy  . Portacath placement N/A 03/10/2013    Procedure: INSERTION PORT-A-CATH WITH FLUOROSCOPY AND ULTRASOUND;  Surgeon: Ernestene Mention, MD;  Location: [facility] OR;  Service: General;  Laterality: N/A;  . Axillary lymph node biopsy Right 03/10/2013    Procedure: AXILLARY LYMPH NODE BIOPSY;  Surgeon: Ernestene Mention, MD;  Location: [facility] OR;  Service: General;  Laterality: Right;  sentinel node with blue dye  . Esophagogastroduodenoscopy N/A 04/28/2013    Procedure: ESOPHAGOGASTRODUODENOSCOPY (EGD);  Surgeon: Graylin Shiver, MD;  Location: [facility];  Service: Endoscopy;  Laterality: N/A;  . Colonoscopy N/A 04/30/2013    Procedure: COLONOSCOPY;  Surgeon: Graylin Shiver, MD;  Location: [facility] ENDOSCOPY;  Service: Endoscopy;  Laterality: N/A;    FAMILY HISTORY Family History  Problem Relation Age of Onset  . Cancer Mother 64    Unknown type of cancer  . Congestive Heart Failure Mother   . Colon cancer Brother   . Diabetes Brother 33  . Heart disease Brother     AMI 05/22/2012  . Congestive Heart Failure Brother   . Cancer Maternal Grandmother 23    Unknown type of cancer   the patient's father died in an automobile accident at the age of 24. The patient's mother lived to be 11. Tabitha Ramirez has 3 brothers, no sisters. One brother had colon cancer at the age of 30. The patient's mother and the patient's mothers mother (maternal grandmother) both were diagnosed with some kind of cancer at the age of 31 or thereabouts, but Jalyah does not know what type of cancer this may have been.  GYNECOLOGIC HISTORY:  Menarche age 67, first live birth age 35. The patient had her hysterectomy in the 1970s. She has been on estrogen replacement for she thinks 18 years.  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 She is divorced, lives by  herself, with no pets. Her first child was given up for adoption. Her second child, Tabitha Ramirez, is a Geophysicist/field seismologist and is working towards a PhD at [employer]. Destany has no grandchildren. She attends new [employer]    ADVANCED DIRECTIVES: In place. The patient has named her daughter Tabitha Ramirez as her healthcare power of attorney. Tabitha Ramirez can be reached at [phone]. The healthcare power of attorney documents were brought by the patient 03/13/2013 and are separately scanned.   HEALTH MAINTENANCE: (Updated 06/30/2013) History  Substance Use Topics  . Smoking status: Former Smoker    Types: Cigarettes  . Smokeless tobacco: Never Used     Comment: smoked in college 1 yr   . Alcohol Use: No     Comment: Wine occasionally     Colonoscopy: August 2012  PAP:  Bone density: Never  Lipid panel: Dr. Jackelyn Knife    Allergies  Allergen Reactions  . Penicillins Rash    Current Outpatient Prescriptions  Medication Sig Dispense Refill  . Acetaminophen (TYLENOL EXTRA STRENGTH PO) Take 1 tablet by mouth as needed.      Marland Kitchen  Alum & Mag Hydroxide-Simeth (MAGIC MOUTHWASH W/LIDOCAINE) SOLN Take 5 mLs by mouth 4 (four) times daily as needed.  240 mL  1  . DPH-Lido-AlHydr-MgHydr-Simeth (FIRST-MOUTHWASH BLM) SUSP       . HYDROcodone-acetaminophen (NORCO/VICODIN) 5-325 MG per tablet Take 1-2 tablets by mouth every 8 (eight) hours as needed for moderate pain.  30 tablet  0  . lidocaine-prilocaine (EMLA) cream Apply 1 application topically as needed (port access). Apply to port area 1-2 hours before chemotherapy; cover with plastioc wrap      . LORazepam (ATIVAN) 0.5 MG tablet Take 1 tablet (0.5 mg total) by mouth at bedtime as needed for anxiety.  20 tablet  0  . Multiple Vitamin (MULTIVITAMIN WITH MINERALS) TABS Take 1 tablet by mouth daily. LIIQUID - Takes 1 cap full daily      . ondansetron (ZOFRAN) 8 MG tablet Take 1 tablet (8 mg total) by mouth every  12 (twelve) hours as needed for nausea.  20 tablet  3  . pantoprazole (PROTONIX) 40 MG tablet Take 1 tablet (40 mg total) by mouth daily at 12 noon.  30 tablet  2  . prochlorperazine (COMPAZINE) 10 MG tablet Take 10 mg by mouth every 6 (six) hours as needed (nausea/anxiety).      . triamterene-hydrochlorothiazide (MAXZIDE-25) 37.5-25 MG per tablet Take 0.5 tablets by mouth daily.      . fluconazole (DIFLUCAN) 100 MG tablet 2 tabs by mouth x 1 day, then 1 tab by mouth daily for total of 7 days  8 tablet  2  . traMADol (ULTRAM) 50 MG tablet Take 1 tablet (50 mg total) by mouth every 6 (six) hours as needed for pain.  60 tablet  0   No current facility-administered medications for this visit.   Facility-Administered Medications Ordered in Other Visits  Medication Dose Route Frequency Provider Last Rate Last Dose  . sodium chloride 0.9 % injection 10 mL  10 mL Intracatheter PRN Catalina Gravel, PA-C   10 mL at 08/11/13 1443    OBJECTIVE: Middle-aged [place] American woman in no acute distress Filed Vitals:   08/11/13 1117  BP: 111/71     Body mass index is 24.88 kg/(m^2).    ECOG FS: 1 Filed Weights   08/11/13 1117  Weight: 145 lb (65.772 kg)   Physical Exam: HEENT:  Sclerae anicteric.  Oropharynx is unremarkable, with no ulcerations or evidence of candidiasis. The tongue itself is slightly hyperpigmented. NODES:  No cervical or supraclavicular lymphadenopathy palpated.  BREAST EXAM: In the right breast, there is a palpable mass in the central portion of the breast, which appears to be stable and measures approximately 3 cm in diameter. Left breast is unremarkable. Axillae are benign, with no palpable lymphadenopathy. LUNGS:  Clear to auscultation bilaterally.  No wheezes or rhonchi HEART:  Regular rate and rhythm. No murmur appreciated. ABDOMEN:  Soft, nontender.  Positive bowel sounds.  MSK:  No focal spinal tenderness to palpation. Full range of motion in the upper extremities, including the  right elbow. No joint swelling. There is some tenderness to palpation of the left temporomandibular joint, and some reduction in range of motion due to discomfort. EXTREMITIES:  No peripheral edema.   SKIN:  Port is intact in the left upper chest wall with no erythema, edema, or evidence of infection. Skin and nailbeds are hyperpigmented bilaterally in the upper extremities, but with no drainage or evidence of infection noted. NEURO:  Nonfocal. Well oriented. Positive  affect.   LAB  RESULTS:   Lab Results  Component Value Date   WBC 4.3 08/11/2013   NEUTROABS 2.0 08/11/2013   HGB 9.3* 08/11/2013   HCT 27.0* 08/11/2013   MCV 99.4 08/11/2013   PLT 264 08/11/2013      Chemistry      Component Value Date/Time   NA 138 08/11/2013 1055   NA 141 04/30/2013 0530   K 3.5 08/11/2013 1055   K 3.2* 04/30/2013 0530   CL 106 04/30/2013 0530   CL 100 03/13/2013 0918   CO2 25 08/11/2013 1055   CO2 26 04/30/2013 0530   BUN 15.9 08/11/2013 1055   BUN 5* 04/30/2013 0530   CREATININE 1.0 08/11/2013 1055   CREATININE 0.75 04/30/2013 0530   CREATININE 0.94 11/29/2011 1535      Component Value Date/Time   CALCIUM 9.8 08/11/2013 1055   CALCIUM 8.7 04/30/2013 0530   ALKPHOS 93 08/11/2013 1055   ALKPHOS 96 02/28/2013 0937   AST 21 08/11/2013 1055   AST 27 02/28/2013 0937   ALT 22 08/11/2013 1055   ALT 27 02/28/2013 0937   BILITOT 0.46 08/11/2013 1055   BILITOT 0.3 02/28/2013 0937       STUDIES: No results found.    ASSESSMENT: 67 y.o. [place] woman   (1)  status post right central breast biopsy 01/31/2013 for a clinical T2 NX, stage II invasive ductal carcinoma, grade 3, triple negative, with an MIB-1 of 84%  (2) biopsy of an enlarged (2.5 cm) right axillary lymph node 01/31/2013 was negative, felt possibly discordant  (3) sentinel lymph node sampling 03/10/2013 showed one of two sentinel nodes to be positive; repeat prognostic profile pending  (4) neoadjuvant doxorubicin and cyclophosphamide being  given in dose dense fashion with Neulasta support starting 03/17/2013, followed by weekly carboplatin and paclitaxel x12, with first cycle on 05/27/2013.  (5)  status post hospitalization in early August 2014 secondary to a significant drop in hemoglobin leading to syncope and loss of consciousness.  (6)  Hx of symptomatic anemia, likely chemo-induced.  Status post blood transfusion, 2 units of packed red blood cells, on 06/10/2013. Now stable and being followed.  (7) Right elbow pain, resolved  (8)  Hx chemotherapy-induced afebrile neutropenia, resolved  (9) chemotherapy-induced peripheral neuropathy, hands and feet, stable to improved  (10) oropharyngeal candidiasis, treated with Magic mouthwash and Diflucan   PLAN: Nafisah's  peripheral neuropathy appears to be stable, and we will proceed as scheduled for her tenth weekly dose of carboplatin/paclitaxel today. Of course we'll continue to follow her very closely for increased signs of neuropathy.   I have refilled her hydrocodone/APAP as requested, for both joint pain and apparent TMJ. As noted above, we did discuss a future referral for physical therapy. Specifically, she would likely be referred to [facility], to [place]. (He has seen a patient of ours in the past for TMJ with great results.) She would like to wait, however, until she has completed chemotherapy and undergone surgery to pursue physical therapy. She will let me know, however, if this worsens.  She'll return the next 2 days for Neupogen as before. I will see her again next Monday, November 24, in anticipation of her 11th weekly dose of carboplatin/paclitaxel. I'm making no changes to her regimen. We will complete 12 weekly doses, after which she will have a repeat breast MRI. This is already been scheduled for December 3, after which she'll see both Dr. Darnelle Catalan and Dr. Derrell Lolling to discuss her definitive surgery.  All  this was reviewed in detail with  Tabitha Ramirez today. She voices understanding and agreement with our plan and she will call with any changes or problems prior to her appointment here with me next week.   Tabitha Scale, PA-C   08/11/2013 4:38 PM

## 2013-08-11 NOTE — Patient Instructions (Signed)
[facility] Discharge Instructions for Patients Receiving Chemotherapy  Today you received the following chemotherapy agents: Taxol.  To help prevent nausea and vomiting after your treatment, we encourage you to take your nausea medication as prescribed.   If you develop nausea and vomiting that is not controlled by your nausea medication, call the clinic.   BELOW ARE SYMPTOMS THAT SHOULD BE REPORTED IMMEDIATELY:  *FEVER GREATER THAN 100.5 F  *CHILLS WITH OR WITHOUT FEVER  NAUSEA AND VOMITING THAT IS NOT CONTROLLED WITH YOUR NAUSEA MEDICATION  *UNUSUAL SHORTNESS OF BREATH  *UNUSUAL BRUISING OR BLEEDING  TENDERNESS IN MOUTH AND THROAT WITH OR WITHOUT PRESENCE OF ULCERS  *URINARY PROBLEMS  *BOWEL PROBLEMS  UNUSUAL RASH Items with * indicate a potential emergency and should be followed up as soon as possible.  Feel free to call the clinic you have any questions or concerns. The clinic phone number is [phone].    

## 2013-08-12 ENCOUNTER — Ambulatory Visit (HOSPITAL_BASED_OUTPATIENT_CLINIC_OR_DEPARTMENT_OTHER): Payer: Medicare Other

## 2013-08-12 VITALS — BP 112/70 | HR 100 | Temp 97.0°F

## 2013-08-12 DIAGNOSIS — C50119 Malignant neoplasm of central portion of unspecified female breast: Secondary | ICD-10-CM

## 2013-08-12 DIAGNOSIS — Z5189 Encounter for other specified aftercare: Secondary | ICD-10-CM

## 2013-08-12 DIAGNOSIS — C50111 Malignant neoplasm of central portion of right female breast: Secondary | ICD-10-CM

## 2013-08-12 DIAGNOSIS — Z862 Personal history of diseases of the blood and blood-forming organs and certain disorders involving the immune mechanism: Secondary | ICD-10-CM

## 2013-08-12 DIAGNOSIS — C50112 Malignant neoplasm of central portion of left female breast: Secondary | ICD-10-CM

## 2013-08-12 MED ORDER — FILGRASTIM 300 MCG/0.5ML IJ SOLN
300.0000 ug | Freq: Once | INTRAMUSCULAR | Status: AC
  Administered 2013-08-12: 300 ug via SUBCUTANEOUS
  Filled 2013-08-12: qty 0.5

## 2013-08-12 NOTE — Patient Instructions (Signed)
Filgrastim, G-CSF injection What is this medicine? FILGRASTIM, G-CSF (fil GRA stim) stimulates the formation of white blood cells. This medicine is given to patients with conditions that may cause a decrease in white blood cells, like those receiving certain types of chemotherapy or bone marrow transplant. It helps the bone marrow recover its ability to produce white blood cells. Increasing the amount of white blood cells helps to decrease the risk of infection and fever. This medicine may be used for other purposes; ask your health care provider or pharmacist if you have questions. COMMON BRAND NAME(S): Neupogen What should I tell my health care provider before I take this medicine? They need to know if you have any of these conditions: -currently receiving radiation therapy -sickle cell disease -an unusual or allergic reaction to filgrastim, E. coli protein, other medicines, foods, dyes, or preservatives -pregnant or trying to get pregnant -breast-feeding How should I use this medicine? This medicine is for injection into a vein or injection under the skin. It is usually given by a health care professional in a hospital or clinic setting. If you get this medicine at home, you will be taught how to prepare and give this medicine. Always change the site for the injection under the skin. Let the solution warm to room temperature before you use it. Do not shake the solution before you withdraw a dose. Throw away any unused portion. Use exactly as directed. Take your medicine at regular intervals. Do not take your medicine more often than directed. It is important that you put your used needles and syringes in a special sharps container. Do not put them in a trash can. If you do not have a sharps container, call your pharmacist or healthcare provider to get one. Talk to your pediatrician regarding the use of this medicine in children. While this medicine may be prescribed for children for selected  conditions, precautions do apply. Overdosage: If you think you have taken too much of this medicine contact a poison control center or emergency room at once. NOTE: This medicine is only for you. Do not share this medicine with others. What if I miss a dose? Try not to miss doses. If you miss a dose take the dose as soon as you remember. If it is almost time for the next dose, do not take double doses unless told to by your doctor or health care professional. What may interact with this medicine? -lithium -medicines for cancer chemotherapy This list may not describe all possible interactions. Give your health care provider a list of all the medicines, herbs, non-prescription drugs, or dietary supplements you use. Also tell them if you smoke, drink alcohol, or use illegal drugs. Some items may interact with your medicine. What should I watch for while using this medicine? Visit your doctor or health care professional for regular checks on your progress. If you get a fever or any sign of infection while you are using this medicine, do not treat yourself. Check with your doctor or health care professional. Bone pain can usually be relieved by mild pain relievers such as acetaminophen or ibuprofen. Check with your doctor or health care professional before taking these medicines as they may hide a fever. Call your doctor or health care professional if the aches and pains are severe or do not go away. What side effects may I notice from receiving this medicine? Side effects that you should report to your doctor or health care professional as soon as possible: -allergic reactions   like skin rash, itching or hives, swelling of the face, lips, or tongue -difficulty breathing, wheezing -fever -pain, redness, or swelling at the injection site -stomach or side pain, or pain at the shoulder Side effects that usually do not require medical attention (report to your doctor or health care professional if they  continue or are bothersome): -bone pain (ribs, lower back, breast bone) -headache -skin rash This list may not describe all possible side effects. Call your doctor for medical advice about side effects. You may report side effects to FDA at 1-800-FDA-1088. Where should I keep my medicine? Keep out of the reach of children. Store in a refrigerator between 2 and 8 degrees C (36 and 46 degrees F). Do not freeze or leave in direct sunlight. If vials or syringes are left out of the refrigerator for more than 24 hours, they must be thrown away. Throw away unused vials after the expiration date on the carton. NOTE: This sheet is a summary. It may not cover all possible information. If you have questions about this medicine, talk to your doctor, pharmacist, or health care provider.  2014, Elsevier/Gold Standard. (2007-11-27 13:33:21)  

## 2013-08-13 ENCOUNTER — Ambulatory Visit (HOSPITAL_BASED_OUTPATIENT_CLINIC_OR_DEPARTMENT_OTHER): Payer: Medicare Other

## 2013-08-13 VITALS — BP 108/71 | HR 96 | Temp 97.0°F

## 2013-08-13 DIAGNOSIS — C50112 Malignant neoplasm of central portion of left female breast: Secondary | ICD-10-CM

## 2013-08-13 DIAGNOSIS — Z862 Personal history of diseases of the blood and blood-forming organs and certain disorders involving the immune mechanism: Secondary | ICD-10-CM

## 2013-08-13 DIAGNOSIS — C50111 Malignant neoplasm of central portion of right female breast: Secondary | ICD-10-CM

## 2013-08-13 DIAGNOSIS — Z5189 Encounter for other specified aftercare: Secondary | ICD-10-CM

## 2013-08-13 DIAGNOSIS — C50119 Malignant neoplasm of central portion of unspecified female breast: Secondary | ICD-10-CM

## 2013-08-13 MED ORDER — FILGRASTIM 300 MCG/0.5ML IJ SOLN
300.0000 ug | Freq: Once | INTRAMUSCULAR | Status: AC
  Administered 2013-08-13: 300 ug via SUBCUTANEOUS
  Filled 2013-08-13: qty 0.5

## 2013-08-15 ENCOUNTER — Ambulatory Visit (HOSPITAL_COMMUNITY): Payer: Medicare Other

## 2013-08-18 ENCOUNTER — Telehealth: Payer: Self-pay | Admitting: Oncology

## 2013-08-18 ENCOUNTER — Encounter: Payer: Self-pay | Admitting: Physician Assistant

## 2013-08-18 ENCOUNTER — Other Ambulatory Visit (HOSPITAL_BASED_OUTPATIENT_CLINIC_OR_DEPARTMENT_OTHER): Payer: Medicare Other | Admitting: Lab

## 2013-08-18 ENCOUNTER — Ambulatory Visit (HOSPITAL_BASED_OUTPATIENT_CLINIC_OR_DEPARTMENT_OTHER): Payer: Medicare Other | Admitting: Physician Assistant

## 2013-08-18 ENCOUNTER — Ambulatory Visit (HOSPITAL_BASED_OUTPATIENT_CLINIC_OR_DEPARTMENT_OTHER): Payer: Medicare Other

## 2013-08-18 ENCOUNTER — Telehealth: Payer: Self-pay | Admitting: *Deleted

## 2013-08-18 VITALS — BP 104/70 | HR 94 | Temp 98.5°F | Resp 18 | Ht 64.0 in | Wt 146.1 lb

## 2013-08-18 DIAGNOSIS — C50119 Malignant neoplasm of central portion of unspecified female breast: Secondary | ICD-10-CM

## 2013-08-18 DIAGNOSIS — Z5111 Encounter for antineoplastic chemotherapy: Secondary | ICD-10-CM

## 2013-08-18 DIAGNOSIS — C50111 Malignant neoplasm of central portion of right female breast: Secondary | ICD-10-CM

## 2013-08-18 DIAGNOSIS — B370 Candidal stomatitis: Secondary | ICD-10-CM

## 2013-08-18 DIAGNOSIS — Z171 Estrogen receptor negative status [ER-]: Secondary | ICD-10-CM

## 2013-08-18 DIAGNOSIS — E876 Hypokalemia: Secondary | ICD-10-CM

## 2013-08-18 DIAGNOSIS — G620 Drug-induced polyneuropathy: Secondary | ICD-10-CM

## 2013-08-18 DIAGNOSIS — D649 Anemia, unspecified: Secondary | ICD-10-CM

## 2013-08-18 DIAGNOSIS — R6884 Jaw pain: Secondary | ICD-10-CM

## 2013-08-18 LAB — COMPREHENSIVE METABOLIC PANEL (CC13)
AST: 21 U/L (ref 5–34)
Albumin: 3.7 g/dL (ref 3.5–5.0)
Alkaline Phosphatase: 87 U/L (ref 40–150)
Anion Gap: 11 mEq/L (ref 3–11)
BUN: 14.5 mg/dL (ref 7.0–26.0)
CO2: 26 mEq/L (ref 22–29)
Creatinine: 0.9 mg/dL (ref 0.6–1.1)
Potassium: 3.6 mEq/L (ref 3.5–5.1)
Total Bilirubin: 0.41 mg/dL (ref 0.20–1.20)
Total Protein: 7.1 g/dL (ref 6.4–8.3)

## 2013-08-18 LAB — CBC WITH DIFFERENTIAL/PLATELET
Basophils Absolute: 0 10*3/uL (ref 0.0–0.1)
EOS%: 0.3 % (ref 0.0–7.0)
Eosinophils Absolute: 0 10*3/uL (ref 0.0–0.5)
HGB: 8.4 g/dL — ABNORMAL LOW (ref 11.6–15.9)
LYMPH%: 33.4 % (ref 14.0–49.7)
MCH: 34.9 pg — ABNORMAL HIGH (ref 25.1–34.0)
MCV: 101.6 fL — ABNORMAL HIGH (ref 79.5–101.0)
MONO%: 17.6 % — ABNORMAL HIGH (ref 0.0–14.0)
NEUT#: 2.8 10*3/uL (ref 1.5–6.5)
RDW: 22.1 % — ABNORMAL HIGH (ref 11.2–14.5)

## 2013-08-18 MED ORDER — DEXAMETHASONE SODIUM PHOSPHATE 20 MG/5ML IJ SOLN
20.0000 mg | Freq: Once | INTRAMUSCULAR | Status: AC
  Administered 2013-08-18: 20 mg via INTRAVENOUS

## 2013-08-18 MED ORDER — DEXAMETHASONE SODIUM PHOSPHATE 20 MG/5ML IJ SOLN
INTRAMUSCULAR | Status: AC
  Filled 2013-08-18: qty 5

## 2013-08-18 MED ORDER — SODIUM CHLORIDE 0.9 % IJ SOLN
10.0000 mL | INTRAMUSCULAR | Status: DC | PRN
  Administered 2013-08-18: 10 mL
  Filled 2013-08-18: qty 10

## 2013-08-18 MED ORDER — ONDANSETRON 16 MG/50ML IVPB (CHCC)
INTRAVENOUS | Status: AC
  Filled 2013-08-18: qty 16

## 2013-08-18 MED ORDER — DIPHENHYDRAMINE HCL 50 MG/ML IJ SOLN
50.0000 mg | Freq: Once | INTRAMUSCULAR | Status: AC
  Administered 2013-08-18: 50 mg via INTRAVENOUS

## 2013-08-18 MED ORDER — SODIUM CHLORIDE 0.9 % IV SOLN
Freq: Once | INTRAVENOUS | Status: AC
  Administered 2013-08-18: 13:00:00 via INTRAVENOUS

## 2013-08-18 MED ORDER — DIPHENHYDRAMINE HCL 50 MG/ML IJ SOLN
INTRAMUSCULAR | Status: AC
  Filled 2013-08-18: qty 1

## 2013-08-18 MED ORDER — ONDANSETRON 16 MG/50ML IVPB (CHCC)
16.0000 mg | Freq: Once | INTRAVENOUS | Status: AC
  Administered 2013-08-18: 16 mg via INTRAVENOUS

## 2013-08-18 MED ORDER — SODIUM CHLORIDE 0.9 % IV SOLN
170.2000 mg | Freq: Once | INTRAVENOUS | Status: AC
  Administered 2013-08-18: 170 mg via INTRAVENOUS
  Filled 2013-08-18: qty 17

## 2013-08-18 MED ORDER — HEPARIN SOD (PORK) LOCK FLUSH 100 UNIT/ML IV SOLN
500.0000 [IU] | Freq: Once | INTRAVENOUS | Status: AC | PRN
  Administered 2013-08-18: 500 [IU]
  Filled 2013-08-18: qty 5

## 2013-08-18 MED ORDER — PACLITAXEL CHEMO INJECTION 300 MG/50ML
80.0000 mg/m2 | Freq: Once | INTRAVENOUS | Status: AC
  Administered 2013-08-18: 138 mg via INTRAVENOUS
  Filled 2013-08-18: qty 23

## 2013-08-18 MED ORDER — FAMOTIDINE IN NACL 20-0.9 MG/50ML-% IV SOLN
INTRAVENOUS | Status: AC
  Filled 2013-08-18: qty 50

## 2013-08-18 MED ORDER — FAMOTIDINE IN NACL 20-0.9 MG/50ML-% IV SOLN
20.0000 mg | Freq: Once | INTRAVENOUS | Status: AC
Start: 2013-08-18 — End: 2013-08-18
  Administered 2013-08-18: 20 mg via INTRAVENOUS

## 2013-08-18 NOTE — Patient Instructions (Signed)
[facility] Discharge Instructions for Patients Receiving Chemotherapy  Today you received the following chemotherapy agents TAXOL, CARBOPLATIN  To help prevent nausea and vomiting after your treatment, we encourage you to take your nausea medication IF NEEDED   If you develop nausea and vomiting that is not controlled by your nausea medication, call the clinic.   BELOW ARE SYMPTOMS THAT SHOULD BE REPORTED IMMEDIATELY:  *FEVER GREATER THAN 100.5 F  *CHILLS WITH OR WITHOUT FEVER  NAUSEA AND VOMITING THAT IS NOT CONTROLLED WITH YOUR NAUSEA MEDICATION  *UNUSUAL SHORTNESS OF BREATH  *UNUSUAL BRUISING OR BLEEDING  TENDERNESS IN MOUTH AND THROAT WITH OR WITHOUT PRESENCE OF ULCERS  *URINARY PROBLEMS  *BOWEL PROBLEMS  UNUSUAL RASH Items with * indicate a potential emergency and should be followed up as soon as possible.  Feel free to call the clinic you have any questions or concerns. The clinic phone number is [phone].    

## 2013-08-18 NOTE — Progress Notes (Signed)
OK to tx with hgb 8.4, planned for transfusion next week, per Zollie Scale PA

## 2013-08-18 NOTE — Progress Notes (Signed)
ID: [name] OB: [birth year]  MR#: [id number]  CSN#:[id number]  PCP: Zenaida Niece, MD GYN:  Lavina Hamman, MD SU: Claud Kelp, MD OTHER MD: Lurline Hare, MD  CHIEF COMPLAINT:  Right Breast Cancer/Neoadjuvant chemotherapy   HISTORY OF PRESENT ILLNESS: Tabitha Ramirez tells me she felt a mass in her right breast about 3 months ago but initially ignored it. She eventually brought it to her physician's attention and was set up for bilateral diagnostic mammography at the [facility] Center 01/31/2013. This showed an area of distortion in the upper outer quadrant measuring approximately 4.3 cm. Diffuse calcifications in both breasts were stable. The mass was palpable at 12:00, 3 cm from the nipple, and by ultrasound it was irregular, hypoechoic, and measured 2.5 cm. In addition there was prominent right axillary adenopathy the largest lymph node measuring 2.5 cm.  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 Biopsy of the lymph node was negative. It did show some lymph node tissue.  Bilateral breast MRI 02/10/2013 showed a ring-enhancing necrotic-appearing 3.4 cm mass in the anterior third of the right breast. There was also a 2.0 cm level one right axillary lymph node that was prominent, but maintains central fat. The left breast the left axilla were benign and there was no internal mammary adenopathy noted.  The patient's subsequent history is as detailed below.  INTERVAL HISTORY: Tabitha Ramirez returns today accompanied by her daughter Tabitha Ramirez for followup of her locally advanced right breast carcinoma. She is due for her 11th of 12 planned weekly doses of carbo/paclitaxel today.  She continues to tolerate the carboplatin/paclitaxel well, and continues to receive Neupogen injections for 2 days after each dose for history of chemotherapy-induced neutropenia. Her white counts  have remained stable, although her hemoglobin is slowly dropping.  She denies any signs of abnormal bleeding. In fact she tells me her energy level is only slightly reduced. She is having some shortness of breath with exertion which is increased slightly.  Tailer continues to have some peripheral neuropathy in the right bone, and also bilaterally in the balls of the feet. This is stable, and she has noticed no worsening over the last several weeks. It is not affecting any of her day-to-day activities. It is not causing excessive discomfort, and has not affected her walking.  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 She cannot take anti-inflammatory medications due to a history of GI bleed, so she is using moist heat on the area. She does take hydrocodone/APAP for the discomfort.  She does not feel like this is worsening, but is definitely not improving. She is limited in how much she can open her mouth, and this sometimes affects her eating.   REVIEW OF SYSTEMS: Ragen  denies any fevers, chills, or night sweats.  She's had no mouth ulcers or oral sensitivity. Her appetite is good, and she denies any problems with nausea, emesis, or change in bowel or bladder habits. She's had no signs of abnormal bleeding, and specifically denies any evidence of GI bleed, with no blood in the stool or evidence of dark tarry stools.  She denies any increased cough, chest pain, or palpitations, but does have some increased shortness of breath with exertion. She still has some pain intermittently in the right elbow which is stable, but denies any new or unusual myalgias, arthralgias, or bony pain otherwise.  She's had no peripheral swelling. She denies any abnormal headaches or dizziness.  A detailed review of systems is otherwise stable and noncontributory.   PAST MEDICAL HISTORY: Past Medical History  Diagnosis Date  . Heart murmur   . Hypertension   . Anemia   . Allergy   .  Arthritis   . Breast cancer   . Migraines   . Cancer     PAST SURGICAL HISTORY: Past Surgical History  Procedure Laterality Date  . Abdominal hysterectomy      With bilateral salpingo-oophorectomy  . Portacath placement N/A 03/10/2013    Procedure: INSERTION PORT-A-CATH WITH FLUOROSCOPY AND ULTRASOUND;  Surgeon: Ernestene Mention, MD;  Location: [facility] OR;  Service: General;  Laterality: N/A;  . Axillary lymph node biopsy Right 03/10/2013    Procedure: AXILLARY LYMPH NODE BIOPSY;  Surgeon: Ernestene Mention, MD;  Location: [facility] OR;  Service: General;  Laterality: Right;  sentinel node with blue dye  . Esophagogastroduodenoscopy N/A 04/28/2013    Procedure: ESOPHAGOGASTRODUODENOSCOPY (EGD);  Surgeon: Graylin Shiver, MD;  Location: [facility];  Service: Endoscopy;  Laterality: N/A;  . Colonoscopy N/A 04/30/2013    Procedure: COLONOSCOPY;  Surgeon: Graylin Shiver, MD;  Location: [facility] ENDOSCOPY;  Service: Endoscopy;  Laterality: N/A;    FAMILY HISTORY Family History  Problem Relation Age of Onset  . Cancer Mother 66    Unknown type of cancer  . Congestive Heart Failure Mother   . Colon cancer Brother   . Diabetes Brother 66  . Heart disease Brother     AMI 05/22/2012  . Congestive Heart Failure Brother   . Cancer Maternal Grandmother 85    Unknown type of cancer   the patient's father died in an automobile accident at the age of 66. The patient's mother lived to be 66. [name] has 3 brothers, no sisters. One brother had colon cancer at the age of 66. The patient's mother and the patient's mothers mother (maternal grandmother) both were diagnosed with some kind of cancer at the age of 66 or thereabouts, but Tabitha Ramirez does not know what type of cancer this may have been.  GYNECOLOGIC HISTORY:  Menarche age 67, first live birth age 67. The patient had her hysterectomy in the 1970s. She has been on estrogen replacement for she thinks 18 years.  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 She is divorced, lives by herself, with no pets. Her first child was given up for adoption. Her second child, [name], is a Geophysicist/field seismologist and is working towards a PhD at [employer]. Audrinna has no grandchildren. She attends new [employer]    ADVANCED DIRECTIVES: In place. The patient has named her daughter Tabitha Ramirez as her healthcare power of attorney. Tabitha Ramirez can be reached at [phone]. The healthcare power of attorney documents were brought by the patient 03/13/2013 and are separately scanned.   HEALTH MAINTENANCE: (Updated 06/30/2013) History  Substance Use Topics  . Smoking status: Former Smoker    Types: Cigarettes  . Smokeless tobacco: Never Used     Comment: smoked in college 1 yr   . Alcohol Use: No     Comment: Wine occasionally     Colonoscopy: August 2012  PAP:  Bone density: Never  Lipid panel: Dr. Jackelyn Knife    Allergies  Allergen Reactions  . Penicillins Rash    Current Outpatient  Prescriptions  Medication Sig Dispense Refill  . Acetaminophen (TYLENOL EXTRA STRENGTH PO) Take 1 tablet by mouth as needed.      . Alum & Mag Hydroxide-Simeth (MAGIC MOUTHWASH W/LIDOCAINE) SOLN Take 5 mLs by mouth 4 (four) times daily as needed.  240 mL  1  . HYDROcodone-acetaminophen (NORCO/VICODIN) 5-325 MG per tablet Take 1-2 tablets by mouth every 8 (eight) hours as needed for moderate pain.  30 tablet  0  . lidocaine-prilocaine (EMLA) cream Apply 1 application topically as needed (port access). Apply to port area 1-2 hours before chemotherapy; cover with plastioc wrap      . LORazepam (ATIVAN) 0.5 MG tablet Take 1 tablet (0.5 mg total) by mouth at bedtime as needed for anxiety.  20 tablet  0  . Multiple Vitamin (MULTIVITAMIN WITH MINERALS) TABS Take 1 tablet by mouth daily. LIIQUID - Takes 1 cap full daily      . ondansetron (ZOFRAN) 8 MG tablet  Take 1 tablet (8 mg total) by mouth every 12 (twelve) hours as needed for nausea.  20 tablet  3  . pantoprazole (PROTONIX) 40 MG tablet Take 1 tablet (40 mg total) by mouth daily at 12 noon.  30 tablet  2  . traMADol (ULTRAM) 50 MG tablet Take 1 tablet (50 mg total) by mouth every 6 (six) hours as needed for pain.  60 tablet  0  . triamterene-hydrochlorothiazide (MAXZIDE-25) 37.5-25 MG per tablet Take 0.5 tablets by mouth daily.      . DPH-Lido-AlHydr-MgHydr-Simeth (FIRST-MOUTHWASH BLM) SUSP       . prochlorperazine (COMPAZINE) 10 MG tablet Take 10 mg by mouth every 6 (six) hours as needed (nausea/anxiety).       No current facility-administered medications for this visit.   Facility-Administered Medications Ordered in Other Visits  Medication Dose Route Frequency Provider Last Rate Last Dose  . CARBOplatin (PARAPLATIN) 170 mg in sodium chloride 0.9 % 100 mL chemo infusion  170 mg Intravenous Once Sylvanus Telford G Eman Morimoto, PA-C      . famotidine (PEPCID) IVPB 20 mg  20 mg Intravenous Once Deliyah Muckle G Anthon Harpole, PA-C      . heparin lock flush 100 unit/mL  500 Units Intracatheter Once PRN Oryan Winterton Allegra Grana, PA-C      . ondansetron (ZOFRAN) IVPB 16 mg  16 mg Intravenous Once Catalina Gravel, PA-C   16 mg at 08/18/13 1236  . PACLitaxel (TAXOL) 138 mg in dextrose 5 % 250 mL chemo infusion (</= 80mg /m2)  80 mg/m2 (Treatment Plan Actual) Intravenous Once Dilana Mcphie G Kel Senn, PA-C      . sodium chloride 0.9 % injection 10 mL  10 mL Intracatheter PRN Luiz Trumpower Allegra Grana, PA-C        OBJECTIVE: Middle-aged [place] American woman in no acute distress Filed Vitals:   08/18/13 1136  BP: 104/70  Pulse: 94  Temp: 98.5 F (36.9 C)  Resp: 18     Body mass index is 25.07 kg/(m^2).    ECOG FS: 1 Filed Weights   08/18/13 1136  Weight: 146 lb 1.6 oz (66.271 kg)   Physical Exam: HEENT:  Sclerae anicteric.  Oropharynx is unremarkable, with no ulcerations or evidence of candidiasis. Patient can (mouth only a small distance, with limited range of motion  secondary to pain in the left jaw. NODES:  No cervical or supraclavicular lymphadenopathy palpated.  BREAST EXAM: In the right breast, there is a palpable mass in the central portion of the breast, which appears to be stable and measures approximately  2.5 cm in diameter. Left breast is unremarkable. Axillae are benign, with no palpable lymphadenopathy. LUNGS:  Clear to auscultation bilaterally.  No wheezes or rhonchi. Good excursion bilaterally. HEART:  Regular rate and rhythm. No murmur appreciated. ABDOMEN:  Soft, nontender.  Positive bowel sounds.  MSK:  No focal spinal tenderness to palpation. Full range of motion in the upper extremities, including the right elbow. No joint swelling.  EXTREMITIES:  No peripheral edema.   SKIN:  Port is intact in the left upper chest wall with no erythema, edema, or evidence of infection. Skin and nailbeds are hyperpigmented bilaterally in the upper extremities, but with no drainage or evidence of infection noted. NEURO:  Nonfocal. Well oriented. Positive  affect.   LAB RESULTS:   Lab Results  Component Value Date   WBC 5.8 08/18/2013   NEUTROABS 2.8 08/18/2013   HGB 8.4* 08/18/2013   HCT 24.4* 08/18/2013   MCV 101.6* 08/18/2013   PLT 243 08/18/2013      Chemistry      Component Value Date/Time   NA 141 08/18/2013 1028   NA 141 04/30/2013 0530   K 3.6 08/18/2013 1028   K 3.2* 04/30/2013 0530   CL 106 04/30/2013 0530   CL 100 03/13/2013 0918   CO2 26 08/18/2013 1028   CO2 26 04/30/2013 0530   BUN 14.5 08/18/2013 1028   BUN 5* 04/30/2013 0530   CREATININE 0.9 08/18/2013 1028   CREATININE 0.75 04/30/2013 0530   CREATININE 0.94 11/29/2011 1535      Component Value Date/Time   CALCIUM 9.9 08/18/2013 1028   CALCIUM 8.7 04/30/2013 0530   ALKPHOS 87 08/18/2013 1028   ALKPHOS 96 02/28/2013 0937   AST 21 08/18/2013 1028   AST 27 02/28/2013 0937   ALT 23 08/18/2013 1028   ALT 27 02/28/2013 0937   BILITOT 0.41 08/18/2013 1028   BILITOT 0.3 02/28/2013 0937        STUDIES: No results found.    ASSESSMENT: 67 y.o. [place] woman   (1)  status post right central breast biopsy 01/31/2013 for a clinical T2 NX, stage II invasive ductal carcinoma, grade 3, triple negative, with an MIB-1 of 84%  (2) biopsy of an enlarged (2.5 cm) right axillary lymph node 01/31/2013 was negative, felt possibly discordant  (3) sentinel lymph node sampling 03/10/2013 showed one of two sentinel nodes to be positive; repeat prognostic profile pending  (4) neoadjuvant doxorubicin and cyclophosphamide being given in dose dense fashion with Neulasta support starting 03/17/2013, followed by weekly carboplatin and paclitaxel x12, with first cycle on 05/27/2013.  (5)  status post hospitalization in early August 2014 secondary to a significant drop in hemoglobin leading to syncope and loss of consciousness.  (6)  Hx of symptomatic anemia, likely chemo-induced.  Status post blood transfusion, 2 units of packed red blood cells, on 06/10/2013. Now stable and being followed.  (7) Right elbow pain, resolved  (8)  Hx chemotherapy-induced afebrile neutropenia, resolved  (9) chemotherapy-induced peripheral neuropathy, hands and feet, stable to improved  (10) oropharyngeal candidiasis, treated with Magic mouthwash and Diflucan   PLAN: Kyndell's  peripheral neuropathy appears to be stable, and we will proceed as scheduled for her 11th weekly dose of carboplatin/paclitaxel today. Of course we'll continue to follow her very closely for increased signs of neuropathy.   I am concerned about her dropping hemoglobin, especially in light of the fact that she will soon be having surgery. We will reevaluate next week, drop a type and hold,  and will tentatively plan for a blood transfusion next Tuesday, December 2.  If [employer] a decrease in her energy level or has increased shortness of breath prior to her appointment next week, she will call and let us know.  I am also concerned  about the pain and limited range of motion in the left jaw. I'm going to obtain a panoramic x-ray, and we will then consider an  additional referral for further evaluation and treatment, either to an oral surgeon, or to physical therapy. Specifically, she would likely be referred to [facility], to [place]. (He has seen a patient of ours in the past for TMJ with great results.)   She'll return the next 2 days for Neupogen as before. I will see her again next Monday, December 1, in anticipation of her 12th and final weekly dose of carboplatin/paclitaxel. I'm making no changes to her current regimen. She'll have a repeat breast MRI on December 3 to assess response after completion of neoadjuvant chemotherapy. Soon thereafter, she will see both Dr. Darnelle Catalan and Dr. Derrell Lolling to discuss her definitive surgery.  All this was reviewed in detail with [name] today. She voices understanding and agreement with our plan and she will call with any changes or problems prior to her appointment here with me next week.   Chauncy Mangiaracina, PA-C   08/18/2013 12:41 PM

## 2013-08-18 NOTE — Telephone Encounter (Signed)
Per staff message and POF I have scheduled appts.  JMW  

## 2013-08-19 ENCOUNTER — Ambulatory Visit (HOSPITAL_BASED_OUTPATIENT_CLINIC_OR_DEPARTMENT_OTHER): Payer: Medicare Other

## 2013-08-19 VITALS — BP 135/73 | HR 103 | Temp 98.1°F

## 2013-08-19 DIAGNOSIS — C50112 Malignant neoplasm of central portion of left female breast: Secondary | ICD-10-CM

## 2013-08-19 DIAGNOSIS — C50111 Malignant neoplasm of central portion of right female breast: Secondary | ICD-10-CM

## 2013-08-19 DIAGNOSIS — Z5189 Encounter for other specified aftercare: Secondary | ICD-10-CM

## 2013-08-19 DIAGNOSIS — C50119 Malignant neoplasm of central portion of unspecified female breast: Secondary | ICD-10-CM

## 2013-08-19 DIAGNOSIS — Z862 Personal history of diseases of the blood and blood-forming organs and certain disorders involving the immune mechanism: Secondary | ICD-10-CM

## 2013-08-19 MED ORDER — FILGRASTIM 300 MCG/0.5ML IJ SOLN
300.0000 ug | Freq: Once | INTRAMUSCULAR | Status: AC
  Administered 2013-08-19: 300 ug via SUBCUTANEOUS
  Filled 2013-08-19: qty 0.5

## 2013-08-20 ENCOUNTER — Ambulatory Visit (HOSPITAL_COMMUNITY)
Admission: RE | Admit: 2013-08-20 | Discharge: 2013-08-20 | Disposition: A | Discharge status: Home / Self Care | Payer: Medicare Other | Source: Ambulatory Visit | Attending: Physician Assistant | Admitting: Physician Assistant

## 2013-08-20 ENCOUNTER — Ambulatory Visit (HOSPITAL_BASED_OUTPATIENT_CLINIC_OR_DEPARTMENT_OTHER): Payer: Medicare Other

## 2013-08-20 VITALS — BP 118/59 | HR 98 | Temp 98.1°F

## 2013-08-20 DIAGNOSIS — M948X9 Other specified disorders of cartilage, unspecified sites: Secondary | ICD-10-CM | POA: Insufficient documentation

## 2013-08-20 DIAGNOSIS — C50112 Malignant neoplasm of central portion of left female breast: Secondary | ICD-10-CM

## 2013-08-20 DIAGNOSIS — C50119 Malignant neoplasm of central portion of unspecified female breast: Secondary | ICD-10-CM

## 2013-08-20 DIAGNOSIS — R6884 Jaw pain: Secondary | ICD-10-CM

## 2013-08-20 DIAGNOSIS — Z862 Personal history of diseases of the blood and blood-forming organs and certain disorders involving the immune mechanism: Secondary | ICD-10-CM

## 2013-08-20 DIAGNOSIS — C50111 Malignant neoplasm of central portion of right female breast: Secondary | ICD-10-CM

## 2013-08-20 DIAGNOSIS — Z5189 Encounter for other specified aftercare: Secondary | ICD-10-CM

## 2013-08-20 MED ORDER — FILGRASTIM 300 MCG/0.5ML IJ SOLN
300.0000 ug | Freq: Once | INTRAMUSCULAR | Status: AC
  Administered 2013-08-20: 300 ug via SUBCUTANEOUS
  Filled 2013-08-20: qty 0.5

## 2013-08-25 ENCOUNTER — Encounter: Payer: Self-pay | Admitting: Physician Assistant

## 2013-08-25 ENCOUNTER — Ambulatory Visit (HOSPITAL_COMMUNITY)
Admission: RE | Admit: 2013-08-25 | Discharge: 2013-08-25 | Disposition: A | Discharge status: Home / Self Care | Payer: Medicare Other | Source: Ambulatory Visit | Attending: Oncology | Admitting: Oncology

## 2013-08-25 ENCOUNTER — Ambulatory Visit (HOSPITAL_BASED_OUTPATIENT_CLINIC_OR_DEPARTMENT_OTHER): Payer: Medicare Other | Admitting: Physician Assistant

## 2013-08-25 ENCOUNTER — Other Ambulatory Visit (HOSPITAL_BASED_OUTPATIENT_CLINIC_OR_DEPARTMENT_OTHER): Payer: Medicare Other | Admitting: Lab

## 2013-08-25 ENCOUNTER — Ambulatory Visit (HOSPITAL_BASED_OUTPATIENT_CLINIC_OR_DEPARTMENT_OTHER): Payer: Medicare Other

## 2013-08-25 VITALS — BP 118/73 | HR 112 | Temp 98.7°F | Resp 18 | Ht 64.0 in | Wt 146.3 lb

## 2013-08-25 DIAGNOSIS — C50111 Malignant neoplasm of central portion of right female breast: Secondary | ICD-10-CM

## 2013-08-25 DIAGNOSIS — C50119 Malignant neoplasm of central portion of unspecified female breast: Secondary | ICD-10-CM

## 2013-08-25 DIAGNOSIS — Z5111 Encounter for antineoplastic chemotherapy: Secondary | ICD-10-CM

## 2013-08-25 DIAGNOSIS — R0609 Other forms of dyspnea: Secondary | ICD-10-CM

## 2013-08-25 DIAGNOSIS — M25521 Pain in right elbow: Secondary | ICD-10-CM

## 2013-08-25 DIAGNOSIS — D649 Anemia, unspecified: Secondary | ICD-10-CM

## 2013-08-25 DIAGNOSIS — R6884 Jaw pain: Secondary | ICD-10-CM

## 2013-08-25 DIAGNOSIS — G609 Hereditary and idiopathic neuropathy, unspecified: Secondary | ICD-10-CM

## 2013-08-25 LAB — CBC WITH DIFFERENTIAL/PLATELET
BASO%: 1.2 % (ref 0.0–2.0)
EOS%: 0.2 % (ref 0.0–7.0)
Eosinophils Absolute: 0 10*3/uL (ref 0.0–0.5)
HCT: 23.2 % — ABNORMAL LOW (ref 34.8–46.6)
LYMPH%: 29.3 % (ref 14.0–49.7)
MCH: 35.9 pg — ABNORMAL HIGH (ref 25.1–34.0)
MCHC: 34.2 g/dL (ref 31.5–36.0)
MCV: 104.9 fL — ABNORMAL HIGH (ref 79.5–101.0)
MONO%: 17.3 % — ABNORMAL HIGH (ref 0.0–14.0)
NEUT%: 52 % (ref 38.4–76.8)
Platelets: 186 10*3/uL (ref 145–400)
RBC: 2.21 10*6/uL — ABNORMAL LOW (ref 3.70–5.45)
RDW: 22.9 % — ABNORMAL HIGH (ref 11.2–14.5)

## 2013-08-25 LAB — HOLD TUBE, BLOOD BANK

## 2013-08-25 MED ORDER — SODIUM CHLORIDE 0.9 % IJ SOLN
10.0000 mL | INTRAMUSCULAR | Status: DC | PRN
  Administered 2013-08-25: 10 mL
  Filled 2013-08-25: qty 10

## 2013-08-25 MED ORDER — ONDANSETRON 16 MG/50ML IVPB (CHCC)
16.0000 mg | Freq: Once | INTRAVENOUS | Status: AC
  Administered 2013-08-25: 16 mg via INTRAVENOUS

## 2013-08-25 MED ORDER — SODIUM CHLORIDE 0.9 % IV SOLN
Freq: Once | INTRAVENOUS | Status: AC
  Administered 2013-08-25: 13:00:00 via INTRAVENOUS

## 2013-08-25 MED ORDER — PACLITAXEL CHEMO INJECTION 300 MG/50ML
80.0000 mg/m2 | Freq: Once | INTRAVENOUS | Status: AC
  Administered 2013-08-25: 138 mg via INTRAVENOUS
  Filled 2013-08-25: qty 23

## 2013-08-25 MED ORDER — FAMOTIDINE IN NACL 20-0.9 MG/50ML-% IV SOLN
20.0000 mg | Freq: Once | INTRAVENOUS | Status: AC
  Administered 2013-08-25: 20 mg via INTRAVENOUS

## 2013-08-25 MED ORDER — FAMOTIDINE IN NACL 20-0.9 MG/50ML-% IV SOLN
INTRAVENOUS | Status: AC
  Filled 2013-08-25: qty 50

## 2013-08-25 MED ORDER — DIPHENHYDRAMINE HCL 50 MG/ML IJ SOLN
INTRAMUSCULAR | Status: AC
  Filled 2013-08-25: qty 1

## 2013-08-25 MED ORDER — SODIUM CHLORIDE 0.9 % IV SOLN
170.2000 mg | Freq: Once | INTRAVENOUS | Status: AC
  Administered 2013-08-25: 170 mg via INTRAVENOUS
  Filled 2013-08-25: qty 17

## 2013-08-25 MED ORDER — DEXAMETHASONE SODIUM PHOSPHATE 20 MG/5ML IJ SOLN
INTRAMUSCULAR | Status: AC
  Filled 2013-08-25: qty 5

## 2013-08-25 MED ORDER — HEPARIN SOD (PORK) LOCK FLUSH 100 UNIT/ML IV SOLN
500.0000 [IU] | Freq: Once | INTRAVENOUS | Status: AC | PRN
  Administered 2013-08-25: 500 [IU]
  Filled 2013-08-25: qty 5

## 2013-08-25 MED ORDER — DIPHENHYDRAMINE HCL 50 MG/ML IJ SOLN
50.0000 mg | Freq: Once | INTRAMUSCULAR | Status: AC
  Administered 2013-08-25: 50 mg via INTRAVENOUS

## 2013-08-25 MED ORDER — DEXAMETHASONE SODIUM PHOSPHATE 20 MG/5ML IJ SOLN
20.0000 mg | Freq: Once | INTRAMUSCULAR | Status: AC
  Administered 2013-08-25: 20 mg via INTRAVENOUS

## 2013-08-25 MED ORDER — HYDROCODONE-ACETAMINOPHEN 5-325 MG PO TABS: 1.0000 | ORAL_TABLET | Freq: Three times a day (TID) | ORAL | Status: DC | PRN

## 2013-08-25 NOTE — Progress Notes (Signed)
ID: [name] OB: 67 15-Aug-1946  MR#: [id number]  CSN#:[id number]  PCP: Zenaida Niece, MD GYN:  Lavina Hamman, MD SU: Claud Kelp, MD OTHER MD: Lurline Hare, MD  CHIEF COMPLAINT:  Right Breast Cancer/Neoadjuvant chemotherapy   HISTORY OF PRESENT ILLNESS: Tabitha Ramirez tells me she felt a mass in her right breast about 3 months ago but initially ignored it. She eventually brought it to her physician's attention and was set up for bilateral diagnostic mammography at the [facility] Center 01/31/2013. This showed an area of distortion in the upper outer quadrant measuring approximately 4.3 cm. Diffuse calcifications in both breasts were stable. The mass was palpable at 12:00, 3 cm from the nipple, and by ultrasound it was irregular, hypoechoic, and measured 2.5 cm. In addition there was prominent right axillary adenopathy the largest lymph node measuring 2.5 cm.  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 Biopsy of the lymph node was negative. It did show some lymph node tissue.  Bilateral breast MRI 02/10/2013 showed a ring-enhancing necrotic-appearing 3.4 cm mass in the anterior third of the right breast. There was also a 2.0 cm level one right axillary lymph node that was prominent, but maintains central fat. The left breast the left axilla were benign and there was no internal mammary adenopathy noted.  The patient's subsequent history is as detailed below.  INTERVAL HISTORY: Tabitha Ramirez returns today accompanied by her daughter [name] for followup of her locally advanced right breast carcinoma. She is due for her 12th and final planned weekly doses of carbo/paclitaxel today, being given in the neoadjuvant setting. She has been receiving 2 injections of Neupogen for 2 days following each dose of chemotherapy due to her history of neutropenia, and her white counts have  remained stable. Her hemoglobin, however, continues to drop slowly and is down to 7.9 today. She is noticing increased symptoms of her anemia, with increased fatigue and some shortness of breath with exertion.  Otherwise, Chasitty is tolerating chemotherapy well, and when asked what her biggest complaint would be, she tells me it is the taste aversion. Fortunately, she has still been able to eat and drink, and has had no nausea or emesis.   We have been following Tabitha Ramirez closely for the development of any peripheral neuropathy. She continues to have some numbness and tingling in her toes and the balls of her feet bilaterally as well as the right thumb, but tells me it is definitely not worse, and if anything, might be slightly better.  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 A panoramic x-ray obtained last week showed suspected erosion of the right condylar head.  (The pain is on the left, possibly from malalignment.) She gets some relief from extra strength Tylenol, but occasionally need to dose of hydrocodone/APAP for the jaw pain. She does request a refill today which is appropriate.     REVIEW OF SYSTEMS: Tabitha Ramirez  denies any fevers, chills, or night sweats.  She's had no mouth ulcers or oral sensitivity.  she's had no change in bowel or bladder habits.  She's had no signs of abnormal bleeding, and specifically denies any evidence of GI bleed, with no blood in the stool or evidence of dark tarry stools.  She denies any increased cough, chest pain, or palpitations, but does have some increased shortness of breath with exertion. She still has some  pain intermittently in the right elbow which is stable.  She denies any new or unusual myalgias, arthralgias, or bony pain otherwise. She's had no peripheral swelling. She denies any abnormal headaches, change in vision, or dizziness.  A detailed review of systems is otherwise stable and noncontributory.   PAST MEDICAL HISTORY: Past  Medical History  Diagnosis Date  . Heart murmur   . Hypertension   . Anemia   . Allergy   . Arthritis   . Breast cancer   . Migraines   . Cancer     PAST SURGICAL HISTORY: Past Surgical History  Procedure Laterality Date  . Abdominal hysterectomy      With bilateral salpingo-oophorectomy  . Portacath placement N/A 03/10/2013    Procedure: INSERTION PORT-A-CATH WITH FLUOROSCOPY AND ULTRASOUND;  Surgeon: Ernestene Mention, MD;  Location: [facility] OR;  Service: General;  Laterality: N/A;  . Axillary lymph node biopsy Right 03/10/2013    Procedure: AXILLARY LYMPH NODE BIOPSY;  Surgeon: Ernestene Mention, MD;  Location: [facility] OR;  Service: General;  Laterality: Right;  sentinel node with blue dye  . Esophagogastroduodenoscopy N/A 04/28/2013    Procedure: ESOPHAGOGASTRODUODENOSCOPY (EGD);  Surgeon: Graylin Shiver, MD;  Location: [facility];  Service: Endoscopy;  Laterality: N/A;  . Colonoscopy N/A 04/30/2013    Procedure: COLONOSCOPY;  Surgeon: Graylin Shiver, MD;  Location: [facility] ENDOSCOPY;  Service: Endoscopy;  Laterality: N/A;    FAMILY HISTORY Family History  Problem Relation Age of Onset  . Cancer Mother 48    Unknown type of cancer  . Congestive Heart Failure Mother   . Colon cancer Brother   . Diabetes Brother 23  . Heart disease Brother     AMI 05/22/2012  . Congestive Heart Failure Brother   . Cancer Maternal Grandmother 5    Unknown type of cancer   the patient's father died in an automobile accident at the age of 56. The patient's mother lived to be 73. Tabitha Ramirez has 3 brothers, no sisters. One brother had colon cancer at the age of 39. The patient's mother and the patient's mothers mother (maternal grandmother) both were diagnosed with some kind of cancer at the age of 10 or thereabouts, but Tabitha Ramirez does not know what type of cancer this may have been.  GYNECOLOGIC HISTORY:  Menarche age 68, first live birth age 39. The patient had her hysterectomy in the 1970s. She has been on estrogen  replacement for she thinks 18 years.  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 She is divorced, lives by herself, with no pets. Her first child was given up for adoption. Her second child, [name], is a Geophysicist/field seismologist and is working towards a PhD at [employer]. Sherryn has no grandchildren. She attends new [employer]    ADVANCED DIRECTIVES: In place. The patient has named her daughter [name] as her healthcare power of attorney. [name] can be reached at [phone]. The healthcare power of attorney documents were brought by the patient 03/13/2013 and are separately scanned.   HEALTH MAINTENANCE: (Updated 06/30/2013) History  Substance Use Topics  . Smoking status: Former Smoker    Types: Cigarettes  . Smokeless tobacco: Never Used     Comment: smoked in college 1 yr   . Alcohol Use: No     Comment: Wine occasionally     Colonoscopy: August 2012  PAP:  Bone density: Never  Lipid panel: Dr. Jackelyn Knife    Allergies  Allergen Reactions  . Penicillins Rash    Current Outpatient Prescriptions  Medication Sig Dispense Refill  . Acetaminophen (TYLENOL EXTRA STRENGTH PO) Take 1 tablet by mouth as needed.      Marland Kitchen HYDROcodone-acetaminophen (NORCO/VICODIN) 5-325 MG per tablet Take 1-2 tablets by mouth every 8 (eight) hours as needed for moderate pain.  30 tablet  0  . lidocaine-prilocaine (EMLA) cream Apply 1 application topically as needed (port access). Apply to port area 1-2 hours before chemotherapy; cover with plastioc wrap      . LORazepam (ATIVAN) 0.5 MG tablet Take 1 tablet (0.5 mg total) by mouth at bedtime as needed for anxiety.  20 tablet  0  . Multiple Vitamin (MULTIVITAMIN WITH MINERALS) TABS Take 1 tablet by mouth daily. LIIQUID - Takes 1 cap full daily      . ondansetron (ZOFRAN) 8 MG tablet Take 1 tablet (8 mg total)  by mouth every 12 (twelve) hours as needed for nausea.  20 tablet  3  . pantoprazole (PROTONIX) 40 MG tablet Take 1 tablet (40 mg total) by mouth daily at 12 noon.  30 tablet  2  . prochlorperazine (COMPAZINE) 10 MG tablet Take 10 mg by mouth every 6 (six) hours as needed (nausea/anxiety).      . triamterene-hydrochlorothiazide (MAXZIDE-25) 37.5-25 MG per tablet Take 0.5 tablets by mouth daily.      . Alum & Mag Hydroxide-Simeth (MAGIC MOUTHWASH W/LIDOCAINE) SOLN Take 5 mLs by mouth 4 (four) times daily as needed.  240 mL  1  . DPH-Lido-AlHydr-MgHydr-Simeth (FIRST-MOUTHWASH BLM) SUSP       . traMADol (ULTRAM) 50 MG tablet Take 1 tablet (50 mg total) by mouth every 6 (six) hours as needed for pain.  60 tablet  0   No current facility-administered medications for this visit.   Facility-Administered Medications Ordered in Other Visits  Medication Dose Route Frequency Provider Last Rate Last Dose  . 0.9 %  sodium chloride infusion   Intravenous Once Evadene Wardrip G Madora Barletta, PA-C      . CARBOplatin (PARAPLATIN) 170 mg in sodium chloride 0.9 % 100 mL chemo infusion  170 mg Intravenous Once Waleska Buttery [employer], PA-C      . Dexamethasone Sodium Phosphate (DECADRON) injection 20 mg  20 mg Intravenous Once Eustace Hur G Maxx Pham, PA-C      . diphenhydrAMINE (BENADRYL) injection 50 mg  50 mg Intravenous Once Bernie Ransford G Bejamin Hackbart, PA-C      . famotidine (PEPCID) IVPB 20 mg  20 mg Intravenous Once Reka Wist G Wojciech Willetts, PA-C      . heparin lock flush 100 unit/mL  500 Units Intracatheter Once PRN Javel Hersh G Honor Fairbank, PA-C      . ondansetron (ZOFRAN) IVPB 16 mg  16 mg Intravenous Once Shoshanna Mcquitty G Bethzaida Boord, PA-C      . PACLitaxel (TAXOL) 138 mg in dextrose 5 % 250 mL chemo infusion (</= 80mg /m2)  80 mg/m2 (Treatment Plan Actual) Intravenous Once Hisako Bugh G Jemimah Cressy, PA-C      . sodium chloride 0.9 % injection 10 mL  10 mL Intracatheter PRN Kaely Hollan Allegra Grana, PA-C        OBJECTIVE: Middle-aged [place] American woman in no acute distress Filed Vitals:   08/25/13 1210  BP: 118/73  Pulse:  112  Temp: 98.7 F (37.1 C)  Resp: 18     Body mass index is 25.1 kg/(m^2).    ECOG FS: 1 Filed Weights  08/25/13 1210  Weight: 146 lb 4.8 oz (66.361 kg)   Physical Exam: HEENT:  Sclerae anicteric.  Oropharynx is unremarkable, with no ulcerations or evidence of candidiasis.  It is somewhat difficult to perform a thorough exam of the oropharynx as range of motion in the temporomandibular joints is limited secondary to pain, primarily pain on the left side, and it is difficult for the patient to fully open her mouth.  NODES:  No cervical or supraclavicular lymphadenopathy palpated.  BREAST EXAM:  Deferred.  Axillae are benign, with no palpable lymphadenopathy. LUNGS:  Clear to auscultation bilaterally.  No wheezes or rhonchi.  HEART:  Regular rate and rhythm. No murmur. ABDOMEN:  Soft, nontender to palpation .  Positive bowel sounds x 4.  MSK:  No focal spinal tenderness to palpation. Full range of motion in the upper extremities, including the right elbow. Again, as noted above, there is some limited range of motion in the temporomandibular joint secondary to pain. EXTREMITIES:  No peripheral edema.   SKIN:  Port is intact in the left upper chest wall with no erythema, edema, or evidence of infection. Skin and nailbeds are hyperpigmented bilaterally in the upper extremities, but with no drainage or evidence of infection noted. NEURO:  Nonfocal. Well oriented. Positive  affect.   LAB RESULTS:   Lab Results  Component Value Date   WBC 5.2 08/25/2013   NEUTROABS 2.7 08/25/2013   HGB 7.9* 08/25/2013   HCT 23.2* 08/25/2013   MCV 104.9* 08/25/2013   PLT 186 08/25/2013      Chemistry      Component Value Date/Time   NA 141 08/18/2013 1028   NA 141 04/30/2013 0530   K 3.6 08/18/2013 1028   K 3.2* 04/30/2013 0530   CL 106 04/30/2013 0530   CL 100 03/13/2013 0918   CO2 26 08/18/2013 1028   CO2 26 04/30/2013 0530   BUN 14.5 08/18/2013 1028   BUN 5* 04/30/2013 0530   CREATININE 0.9 08/18/2013 1028    CREATININE 0.75 04/30/2013 0530   CREATININE 0.94 11/29/2011 1535      Component Value Date/Time   CALCIUM 9.9 08/18/2013 1028   CALCIUM 8.7 04/30/2013 0530   ALKPHOS 87 08/18/2013 1028   ALKPHOS 96 02/28/2013 0937   AST 21 08/18/2013 1028   AST 27 02/28/2013 0937   ALT 23 08/18/2013 1028   ALT 27 02/28/2013 0937   BILITOT 0.41 08/18/2013 1028   BILITOT 0.3 02/28/2013 0937       STUDIES: Breast MRI is scheduled to be repeated later this week on December 3.    ASSESSMENT: 67 y.o. [place] woman   (1)  status post right central breast biopsy 01/31/2013 for a clinical T2 NX, stage II invasive ductal carcinoma, grade 3, triple negative, with an MIB-1 of 84%  (2) biopsy of an enlarged (2.5 cm) right axillary lymph node 01/31/2013 was negative, felt possibly discordant  (3) sentinel lymph node sampling 03/10/2013 showed one of two sentinel nodes to be positive; repeat prognostic profile pending  (4) neoadjuvant doxorubicin and cyclophosphamide being given in dose dense fashion with Neulasta support starting 03/17/2013, followed by weekly carboplatin and paclitaxel x12, with first cycle on 05/27/2013.  (5)  status post hospitalization in early August 2014 secondary to a significant drop in hemoglobin leading to syncope and loss of consciousness.  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  Status post blood transfusion, 2 units of packed red blood cells, on 06/10/2013. Scheduled for blood transfusion again on 12-14.  (  7) Right elbow pain, resolved  (8)  Hx chemotherapy-induced afebrile neutropenia, resolved  (9) chemotherapy-induced peripheral neuropathy, hands and feet, stable to improved  (10) oropharyngeal candidiasis, treated with Magic mouthwash and Diflucan, resolved  (11) pain in the left temporomandibular joint, with associated limitation in range of motion   PLAN: Linder will proceed to treatment today as scheduled for her 12th and final dose of neoadjuvant chemotherapy.  She's already scheduled for her Neupogen injections for the next 2 days, and she is also coming in tomorrow, December 2, for a blood transfusion, 2 units of packed red blood cells for symptomatic anemia.   I'm placing a referral to an oral surgeon, Dr. Manson Passey, for further evaluation of likely TMJ with worsening pain. I have refilled her hydrocodone/APAP as well, 5/325 mg, 30 tablets with no refills.  Jeweline will have her final breast MRI later this week and is scheduled to see both Dr. Darnelle Catalan and Dr. Derrell Lolling next week to discuss her definitive breast surgery.  All this was reviewed in detail with Tabitha Ramirez today. She voices understanding and agreement with our plan and she will call with any changes or problems prior to her appointment here next week.   Breigh Annett, PA-C   08/25/2013 1:34 PM

## 2013-08-25 NOTE — Patient Instructions (Signed)
[facility] Discharge Instructions for Patients Receiving Chemotherapy  Today you received the following chemotherapy agents: Taxol and Carboplatin.  To help prevent nausea and vomiting after your treatment, we encourage you to take your nausea medication as prescribed.   If you develop nausea and vomiting that is not controlled by your nausea medication, call the clinic.   BELOW ARE SYMPTOMS THAT SHOULD BE REPORTED IMMEDIATELY:  *FEVER GREATER THAN 100.5 F  *CHILLS WITH OR WITHOUT FEVER  NAUSEA AND VOMITING THAT IS NOT CONTROLLED WITH YOUR NAUSEA MEDICATION  *UNUSUAL SHORTNESS OF BREATH  *UNUSUAL BRUISING OR BLEEDING  TENDERNESS IN MOUTH AND THROAT WITH OR WITHOUT PRESENCE OF ULCERS  *URINARY PROBLEMS  *BOWEL PROBLEMS  UNUSUAL RASH Items with * indicate a potential emergency and should be followed up as soon as possible.  Feel free to call the clinic you have any questions or concerns. The clinic phone number is [phone].    

## 2013-08-26 ENCOUNTER — Ambulatory Visit (HOSPITAL_BASED_OUTPATIENT_CLINIC_OR_DEPARTMENT_OTHER): Payer: Medicare Other

## 2013-08-26 ENCOUNTER — Ambulatory Visit: Payer: Medicare Other

## 2013-08-26 ENCOUNTER — Other Ambulatory Visit: Payer: Self-pay | Admitting: Physician Assistant

## 2013-08-26 VITALS — BP 135/90 | HR 96 | Temp 98.6°F | Resp 18

## 2013-08-26 DIAGNOSIS — Z862 Personal history of diseases of the blood and blood-forming organs and certain disorders involving the immune mechanism: Secondary | ICD-10-CM

## 2013-08-26 DIAGNOSIS — C50111 Malignant neoplasm of central portion of right female breast: Secondary | ICD-10-CM

## 2013-08-26 DIAGNOSIS — D649 Anemia, unspecified: Secondary | ICD-10-CM

## 2013-08-26 DIAGNOSIS — C50919 Malignant neoplasm of unspecified site of unspecified female breast: Secondary | ICD-10-CM

## 2013-08-26 DIAGNOSIS — C50112 Malignant neoplasm of central portion of left female breast: Secondary | ICD-10-CM

## 2013-08-26 DIAGNOSIS — Z5189 Encounter for other specified aftercare: Secondary | ICD-10-CM

## 2013-08-26 LAB — PREPARE RBC (CROSSMATCH)

## 2013-08-26 MED ORDER — FILGRASTIM 300 MCG/0.5ML IJ SOLN
300.0000 ug | Freq: Once | INTRAMUSCULAR | Status: AC
  Administered 2013-08-26: 300 ug via SUBCUTANEOUS
  Filled 2013-08-26: qty 0.5

## 2013-08-26 MED ORDER — ACETAMINOPHEN 325 MG PO TABS
650.0000 mg | ORAL_TABLET | Freq: Once | ORAL | Status: AC
  Administered 2013-08-26: 650 mg via ORAL

## 2013-08-26 MED ORDER — DIPHENHYDRAMINE HCL 25 MG PO CAPS
ORAL_CAPSULE | ORAL | Status: AC
  Filled 2013-08-26: qty 1

## 2013-08-26 MED ORDER — DIPHENHYDRAMINE HCL 25 MG PO CAPS
25.0000 mg | ORAL_CAPSULE | Freq: Once | ORAL | Status: AC
  Administered 2013-08-26: 25 mg via ORAL

## 2013-08-26 MED ORDER — HEPARIN SOD (PORK) LOCK FLUSH 100 UNIT/ML IV SOLN
500.0000 [IU] | Freq: Every day | INTRAVENOUS | Status: AC | PRN
  Administered 2013-08-26: 500 [IU]
  Filled 2013-08-26: qty 5

## 2013-08-26 MED ORDER — SODIUM CHLORIDE 0.9 % IV SOLN
250.0000 mL | Freq: Once | INTRAVENOUS | Status: AC
  Administered 2013-08-26: 250 mL via INTRAVENOUS

## 2013-08-26 MED ORDER — ACETAMINOPHEN 325 MG PO TABS
ORAL_TABLET | ORAL | Status: AC
  Filled 2013-08-26: qty 2

## 2013-08-26 MED ORDER — SODIUM CHLORIDE 0.9 % IJ SOLN
10.0000 mL | INTRAMUSCULAR | Status: AC | PRN
  Administered 2013-08-26: 10 mL
  Filled 2013-08-26: qty 10

## 2013-08-26 NOTE — Patient Instructions (Addendum)
Blood Transfusion  A blood transfusion replaces your blood or some of its parts. Blood is replaced when you have lost blood because of surgery, an accident, or for severe blood conditions like anemia. You can donate blood to be used on yourself if you have a planned surgery. If you lose blood during that surgery, your own blood can be given back to you. Any blood given to you is checked to make sure it matches your blood type. Your temperature, blood pressure, and heart rate (vital signs) will be checked often.  GET HELP RIGHT AWAY IF:   You feel sick to your stomach (nauseous) or throw up (vomit).  You have watery poop (diarrhea).  You have shortness of breath or trouble breathing.  You have blood in your pee (urine) or have dark colored pee.  You have chest pain or tightness.  Your eyes or skin turn yellow (jaundice).  You have a temperature by mouth above 102 F (38.9 C), not controlled by medicine.  You start to shake and have chills.  You develop a a red rash (hives) or feel itchy.  You develop lightheadedness or feel confused.  You develop back, joint, or muscle pain.  You do not feel hungry (lost appetite).  You feel tired, restless, or nervous.  You develop belly (abdominal) cramps. Document Released: 12/08/2008 Document Revised: 12/04/2011 Document Reviewed: 12/08/2008 [facility] Patient Information 2014 [place], [place].   Filgrastim, G-CSF injection (Neupogen) What is this medicine? FILGRASTIM, G-CSF (fil GRA stim) stimulates the formation of white blood cells. This medicine is given to patients with conditions that may cause a decrease in white blood cells, like those receiving certain types of chemotherapy or bone marrow transplant. It helps the bone marrow recover its ability to produce white blood cells. Increasing the amount of white blood cells helps to decrease the risk of infection and fever. This medicine may be used for other purposes; ask your health care  provider or pharmacist if you have questions. COMMON BRAND NAME(S): Neupogen What should I tell my health care provider before I take this medicine? They need to know if you have any of these conditions: -currently receiving radiation therapy -sickle cell disease -an unusual or allergic reaction to filgrastim, E. coli protein, other medicines, foods, dyes, or preservatives -pregnant or trying to get pregnant -breast-feeding How should I use this medicine? This medicine is for injection into a vein or injection under the skin. It is usually given by a health care professional in a hospital or clinic setting. If you get this medicine at home, you will be taught how to prepare and give this medicine. Always change the site for the injection under the skin. Let the solution warm to room temperature before you use it. Do not shake the solution before you withdraw a dose. Throw away any unused portion. Use exactly as directed. Take your medicine at regular intervals. Do not take your medicine more often than directed. It is important that you put your used needles and syringes in a special sharps container. Do not put them in a trash can. If you do not have a sharps container, call your pharmacist or healthcare provider to get one. Talk to your pediatrician regarding the use of this medicine in children. While this medicine may be prescribed for children for selected conditions, precautions do apply. Overdosage: If you think you have taken too much of this medicine contact a poison control center or emergency room at once. NOTE: This medicine is only for  you. Do not share this medicine with others. What if I miss a dose? Try not to miss doses. If you miss a dose take the dose as soon as you remember. If it is almost time for the next dose, do not take double doses unless told to by your doctor or health care professional. What may interact with this medicine? -lithium -medicines for cancer  chemotherapy This list may not describe all possible interactions. Give your health care provider a list of all the medicines, herbs, non-prescription drugs, or dietary supplements you use. Also tell them if you smoke, drink alcohol, or use illegal drugs. Some items may interact with your medicine. What should I watch for while using this medicine? Visit your doctor or health care professional for regular checks on your progress. If you get a fever or any sign of infection while you are using this medicine, do not treat yourself. Check with your doctor or health care professional. Bone pain can usually be relieved by mild pain relievers such as acetaminophen or ibuprofen. Check with your doctor or health care professional before taking these medicines as they may hide a fever. Call your doctor or health care professional if the aches and pains are severe or do not go away. What side effects may I notice from receiving this medicine? Side effects that you should report to your doctor or health care professional as soon as possible: -allergic reactions like skin rash, itching or hives, swelling of the face, lips, or tongue -difficulty breathing, wheezing -fever -pain, redness, or swelling at the injection site -stomach or side pain, or pain at the shoulder Side effects that usually do not require medical attention (report to your doctor or health care professional if they continue or are bothersome): -bone pain (ribs, lower back, breast bone) -headache -skin rash This list may not describe all possible side effects. Call your doctor for medical advice about side effects. You may report side effects to FDA at 1-800-FDA-1088. Where should I keep my medicine? Keep out of the reach of children. Store in a refrigerator between 2 and 8 degrees C (36 and 46 degrees F). Do not freeze or leave in direct sunlight. If vials or syringes are left out of the refrigerator for more than 24 hours, they must be  thrown away. Throw away unused vials after the expiration date on the carton. NOTE: This sheet is a summary. It may not cover all possible information. If you have questions about this medicine, talk to your doctor, pharmacist, or health care provider.  2014, Elsevier/Gold Standard. (2007-11-27 13:33:21)

## 2013-08-27 ENCOUNTER — Ambulatory Visit (HOSPITAL_COMMUNITY)
Admission: RE | Admit: 2013-08-27 | Discharge: 2013-08-27 | Disposition: A | Discharge status: Home / Self Care | Payer: Medicare Other | Source: Ambulatory Visit | Attending: Physician Assistant | Admitting: Physician Assistant

## 2013-08-27 ENCOUNTER — Other Ambulatory Visit: Payer: Self-pay | Admitting: Physician Assistant

## 2013-08-27 ENCOUNTER — Ambulatory Visit (HOSPITAL_BASED_OUTPATIENT_CLINIC_OR_DEPARTMENT_OTHER): Payer: Medicare Other

## 2013-08-27 VITALS — BP 125/69 | HR 101 | Temp 98.2°F

## 2013-08-27 DIAGNOSIS — C50111 Malignant neoplasm of central portion of right female breast: Secondary | ICD-10-CM

## 2013-08-27 DIAGNOSIS — C50919 Malignant neoplasm of unspecified site of unspecified female breast: Secondary | ICD-10-CM | POA: Insufficient documentation

## 2013-08-27 DIAGNOSIS — R6884 Jaw pain: Secondary | ICD-10-CM

## 2013-08-27 DIAGNOSIS — C50119 Malignant neoplasm of central portion of unspecified female breast: Secondary | ICD-10-CM

## 2013-08-27 DIAGNOSIS — Z9221 Personal history of antineoplastic chemotherapy: Secondary | ICD-10-CM | POA: Insufficient documentation

## 2013-08-27 DIAGNOSIS — C50112 Malignant neoplasm of central portion of left female breast: Secondary | ICD-10-CM

## 2013-08-27 DIAGNOSIS — Z862 Personal history of diseases of the blood and blood-forming organs and certain disorders involving the immune mechanism: Secondary | ICD-10-CM

## 2013-08-27 DIAGNOSIS — Z5189 Encounter for other specified aftercare: Secondary | ICD-10-CM

## 2013-08-27 LAB — TYPE AND SCREEN

## 2013-08-27 MED ORDER — GADOBENATE DIMEGLUMINE 529 MG/ML IV SOLN
15.0000 mL | Freq: Once | INTRAVENOUS | Status: AC | PRN
  Administered 2013-08-27: 13 mL via INTRAVENOUS

## 2013-08-27 MED ORDER — FILGRASTIM 300 MCG/0.5ML IJ SOLN
300.0000 ug | Freq: Once | INTRAMUSCULAR | Status: AC
  Administered 2013-08-27: 300 ug via SUBCUTANEOUS
  Filled 2013-08-27: qty 0.5

## 2013-08-27 NOTE — Progress Notes (Signed)
I have been trying to make a referral for further evaluation of Tabitha Ramirez's TMJ problems. We were unable to find an ENT or oral surgeon who would treat her problem.  I contacted Dr. Cindra Eves for his advice, and he suggested that we try Dr. Ocie Doyne, an oral surgeon. If insurance is a problem, he recommended that Tabitha Ramirez go back to her primary dentist for further evaluation. We're also referring her to physical therapy as soon as possible.  Zollie Scale, PA-C 08/27/2013

## 2013-08-28 ENCOUNTER — Telehealth: Payer: Self-pay | Admitting: Oncology

## 2013-09-01 ENCOUNTER — Encounter: Payer: Self-pay | Admitting: *Deleted

## 2013-09-02 ENCOUNTER — Telehealth: Payer: Self-pay | Admitting: Oncology

## 2013-09-02 ENCOUNTER — Ambulatory Visit (HOSPITAL_BASED_OUTPATIENT_CLINIC_OR_DEPARTMENT_OTHER): Payer: Medicare Other | Admitting: Oncology

## 2013-09-02 ENCOUNTER — Other Ambulatory Visit (HOSPITAL_BASED_OUTPATIENT_CLINIC_OR_DEPARTMENT_OTHER): Payer: Medicare Other | Admitting: Lab

## 2013-09-02 VITALS — BP 123/75 | HR 103 | Temp 98.9°F | Resp 18 | Ht 64.0 in | Wt 148.3 lb

## 2013-09-02 DIAGNOSIS — E876 Hypokalemia: Secondary | ICD-10-CM

## 2013-09-02 DIAGNOSIS — C50111 Malignant neoplasm of central portion of right female breast: Secondary | ICD-10-CM

## 2013-09-02 DIAGNOSIS — C50119 Malignant neoplasm of central portion of unspecified female breast: Secondary | ICD-10-CM

## 2013-09-02 DIAGNOSIS — D649 Anemia, unspecified: Secondary | ICD-10-CM

## 2013-09-02 DIAGNOSIS — Z171 Estrogen receptor negative status [ER-]: Secondary | ICD-10-CM

## 2013-09-02 LAB — CBC WITH DIFFERENTIAL/PLATELET
EOS%: 0.4 % (ref 0.0–7.0)
Eosinophils Absolute: 0 10*3/uL (ref 0.0–0.5)
HGB: 9.7 g/dL — ABNORMAL LOW (ref 11.6–15.9)
LYMPH%: 34.6 % (ref 14.0–49.7)
MCH: 32.5 pg (ref 25.1–34.0)
MCHC: 33.7 g/dL (ref 31.5–36.0)
MCV: 96.6 fL (ref 79.5–101.0)
MONO%: 16.3 % — ABNORMAL HIGH (ref 0.0–14.0)
NEUT#: 2.7 10*3/uL (ref 1.5–6.5)
RBC: 2.99 10*6/uL — ABNORMAL LOW (ref 3.70–5.45)
RDW: 25.7 % — ABNORMAL HIGH (ref 11.2–14.5)
lymph#: 1.9 10*3/uL (ref 0.9–3.3)

## 2013-09-02 LAB — COMPREHENSIVE METABOLIC PANEL (CC13)
ALT: 26 U/L (ref 0–55)
AST: 23 U/L (ref 5–34)
Albumin: 3.7 g/dL (ref 3.5–5.0)
Alkaline Phosphatase: 87 U/L (ref 40–150)
Anion Gap: 13 mEq/L — ABNORMAL HIGH (ref 3–11)
Calcium: 9.6 mg/dL (ref 8.4–10.4)
Chloride: 102 mEq/L (ref 98–109)
Glucose: 119 mg/dl (ref 70–140)
Potassium: 3.4 mEq/L — ABNORMAL LOW (ref 3.5–5.1)
Sodium: 141 mEq/L (ref 136–145)
Total Protein: 6.9 g/dL (ref 6.4–8.3)

## 2013-09-02 NOTE — Progress Notes (Signed)
ID: Tabitha Ramirez OB: 31-Dec-1945  MR#: [id number]  CSN#:[id number]  PCP: Zenaida Niece, MD GYN:  Lavina Hamman, MD SU: Claud Kelp, MD OTHER MD: Lurline Hare, MD  CHIEF COMPLAINT:  Right Breast Cancer/Neoadjuvant chemotherapy   HISTORY OF PRESENT ILLNESS: Tabitha Ramirez tells me she felt a mass in her right breast about 3 months ago but initially ignored it. She eventually brought it to her physician's attention and was set up for bilateral diagnostic mammography at the [facility] Center 01/31/2013. This showed an area of distortion in the upper outer quadrant measuring approximately 4.3 cm. Diffuse calcifications in both breasts were stable. The mass was palpable at 12:00, 3 cm from the nipple, and by ultrasound it was irregular, hypoechoic, and measured 2.5 cm. In addition there was prominent right axillary adenopathy the largest lymph node measuring 2.5 cm.  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 Biopsy of the lymph node was negative. It did show some lymph node tissue.  Bilateral breast MRI 02/10/2013 showed a ring-enhancing necrotic-appearing 3.4 cm mass in the anterior third of the right breast. There was also a 2.0 cm level one right axillary lymph node that was prominent, but maintains central fat. The left breast the left axilla were benign and there was no internal mammary adenopathy noted.  The patient's subsequent history is as detailed below.  INTERVAL HISTORY: Tabitha Ramirez returns today accompanied by her daughter Tabitha Ramirez for followup of her locally advanced right breast carcinoma. Since her last visit here she completed her chemotherapy and had a restaging breast MRI which shows an excellent though not a complete radiologic response. She is ready scheduled to see Dr. Derrell Lolling 2 days from now to discuss surgical options.  REVIEW OF SYSTEMS: Tabitha Ramirez  has a  little bit more energy after receiving 2 units of packed red cells a week ago. She sleeps poorly, but is taking tramadol at bedtime and that is helping. She has some arthritis pains here in there but these are not more intense or frequent than before. Her TMJ continues and she cannot see the dentist until January for insurance reasons. Otherwise a detailed review of systems today was stable   PAST MEDICAL HISTORY: Past Medical History  Diagnosis Date  . Heart murmur   . Hypertension   . Anemia   . Allergy   . Arthritis   . Breast cancer   . Migraines   . Cancer     PAST SURGICAL HISTORY: Past Surgical History  Procedure Laterality Date  . Abdominal hysterectomy      With bilateral salpingo-oophorectomy  . Portacath placement N/A 03/10/2013    Procedure: INSERTION PORT-A-CATH WITH FLUOROSCOPY AND ULTRASOUND;  Surgeon: Ernestene Mention, MD;  Location: [facility] OR;  Service: General;  Laterality: N/A;  . Axillary lymph node biopsy Right 03/10/2013    Procedure: AXILLARY LYMPH NODE BIOPSY;  Surgeon: Ernestene Mention, MD;  Location: [facility] OR;  Service: General;  Laterality: Right;  sentinel node with blue dye  . Esophagogastroduodenoscopy N/A 04/28/2013    Procedure: ESOPHAGOGASTRODUODENOSCOPY (EGD);  Surgeon: Graylin Shiver, MD;  Location: [facility];  Service: Endoscopy;  Laterality: N/A;  . Colonoscopy N/A 04/30/2013    Procedure: COLONOSCOPY;  Surgeon: Graylin Shiver, MD;  Location: [facility] ENDOSCOPY;  Service: Endoscopy;  Laterality: N/A;    FAMILY HISTORY Family History  Problem Relation Age of Onset  . Cancer  Mother 56    Unknown type of cancer  . Congestive Heart Failure Mother   . Colon cancer Brother   . Diabetes Brother 84  . Heart disease Brother     AMI 05/22/2012  . Congestive Heart Failure Brother   . Cancer Maternal Grandmother 25    Unknown type of cancer   the patient's father died in an automobile accident at the age of 15. The patient's mother lived to be 62. [name] has 3  brothers, no sisters. One brother had colon cancer at the age of 66. The patient's mother and the patient's mothers mother (maternal grandmother) both were diagnosed with some kind of cancer at the age of 58 or thereabouts, but Tabitha Ramirez does not know what type of cancer this may have been.  GYNECOLOGIC HISTORY:  Menarche age 64, first live birth age 69. The patient had her hysterectomy in the 1970s. She has been on estrogen replacement for she thinks 18 years.  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 She is divorced, lives by herself, with no pets. Her first child was given up for adoption. Her second child, Tabitha Ramirez, is a Geophysicist/field seismologist and is working towards a PhD at [employer]. [name] has no grandchildren. She attends new [employer]    ADVANCED DIRECTIVES: In place. The patient has named her daughter Tabitha Ramirez as her healthcare power of attorney. Tabitha Ramirez can be reached at [phone]. The healthcare power of attorney documents were brought by the patient 03/13/2013 and are separately scanned.   HEALTH MAINTENANCE: (Updated 06/30/2013) History  Substance Use Topics  . Smoking status: Former Smoker    Types: Cigarettes  . Smokeless tobacco: Never Used     Comment: smoked in college 1 yr   . Alcohol Use: No     Comment: Wine occasionally     Colonoscopy: August 2012  PAP:  Bone density: Never  Lipid panel: Dr. Jackelyn Knife    Allergies  Allergen Reactions  . Penicillins Rash     Objective: Middle-aged [place] American woman in no acute distress Filed Vitals:   09/02/13 1400  BP: 123/75  Pulse: 103  Temp: 98.9 F (37.2 C)  Resp: 18       Body mass index is 25.44 kg/(m^2).    ECOG FS: 1 Filed Weights   09/02/13 1400  Weight: 148 lb 4.8 oz (67.268 kg)  Sclerae unicteric, pupils equal and round Oropharynx clear and  moist-- no thrush No cervical or supraclavicular adenopathy Lungs no rales or rhonchi Heart regular rate and rhythm Abd soft, nontender, positive bowel sounds MSK no focal spinal tenderness, no upper extremity lymphedema Neuro: nonfocal, well oriented, appropriate affect Breasts: In the right breast I can palpate a mass immediately lateral to the nipple, measuring approximately 1-1-1/2 cm. There are no skin or nipple changes of concern. The right axilla is benign. The left breast is unremarkable     LAB RESULTS:   Lab Results  Component Value Date   WBC 5.6 09/02/2013   NEUTROABS 2.7 09/02/2013   HGB 9.7* 09/02/2013   HCT 28.8* 09/02/2013   MCV 96.6 09/02/2013   PLT 185 09/02/2013      Chemistry      Component Value Date/Time   NA 141 09/02/2013 1346   NA 141 04/30/2013 0530   K 3.4* 09/02/2013 1346   K 3.2* 04/30/2013 0530  CL 106 04/30/2013 0530   CL 100 03/13/2013 0918   CO2 26 09/02/2013 1346   CO2 26 04/30/2013 0530   BUN 17.9 09/02/2013 1346   BUN 5* 04/30/2013 0530   CREATININE 1.0 09/02/2013 1346   CREATININE 0.75 04/30/2013 0530   CREATININE 0.94 11/29/2011 1535      Component Value Date/Time   CALCIUM 9.6 09/02/2013 1346   CALCIUM 8.7 04/30/2013 0530   ALKPHOS 87 09/02/2013 1346   ALKPHOS 96 02/28/2013 0937   AST 23 09/02/2013 1346   AST 27 02/28/2013 0937   ALT 26 09/02/2013 1346   ALT 27 02/28/2013 0937   BILITOT 0.55 09/02/2013 1346   BILITOT 0.3 02/28/2013 0937       STUDIES: BILATERAL BREAST MRI WITH AND WITHOUT CONTRAST  LABS: None  TECHNIQUE:  Multiplanar, multisequence MR images of both breasts were obtained  prior to and following the intravenous administration of 13ml of  MultiHance.  THREE-DIMENSIONAL MR IMAGE RENDERING ON INDEPENDENT WORKSTATION:  Three-dimensional MR images were rendered by post-processing of the  original MR data on an independent workstation. The  three-dimensional MR images were interpreted, and findings are  reported in the following complete  MRI report for this study. Three  dimensional images were evaluated at the independent DynaCad  workstation  COMPARISON: Prior mammograms and MRIs most recently dated  05/04/2013  FINDINGS:  Breast composition: c: Heterogeneous fibroglandular tissue  Background parenchymal enhancement: Minimal  Right breast: Interval decrease in the size and enhancement of the  known right-sided breast cancer, 12 o'clock position of the right  breast anteriorly now measuring 1.1 x 1.5 x 1.4 cm without  significant enhancement seen today. Biopsy clip is seen in  association with the mass. Postsurgical changes are seen in the  upper-outer quadrant of the right breast at site of sentinel node  excisional biopsy.  Left breast: No mass or abnormal enhancement.  Lymph nodes: No abnormal appearing lymph nodes.  Ancillary findings: None. Respiratory motion limits evaluation on  the T2 weighted images. No pleural effusion or pericardial effusion  is seen today.  IMPRESSION:  Continued interval response to neoadjuvant chemotherapy with  persistent nonenhancing mass, right breast as detailed above. No MRI  specific evidence of malignancy, left breast.  RECOMMENDATION:  Treatment PLAN, right breast.  BI-RADS CATEGORY 6: Known biopsy-proven malignancy - appropriate  action should be taken.  Electronically Signed  By: Vincenza Hews M.D.  On: 08/27/2013 12:56   ASSESSMENT: 67 y.o. Tabitha Ramirez woman   (1)  status post right central breast biopsy 01/31/2013 for a clinical T2 NX, stage II invasive ductal carcinoma, grade 3, triple negative, with an MIB-1 of 84%  (2) biopsy of an enlarged (2.5 cm) right axillary lymph node 01/31/2013 was negative, felt possibly discordant  (3) sentinel lymph node sampling 03/10/2013 showed one of two sentinel nodes to be positive; repeat prognostic now showing estrogen receptor 3% positive with weak staining and progesterone receptor 6% positive, with moderate staining. Repeat  MIB-1 was 50%. HER-2/neu was again not amplified  (4) neoadjuvant doxorubicin and cyclophosphamide being given in dose dense fashion with Neulasta support starting 03/17/2013, followed by weekly carboplatin and paclitaxel x12, with first cycle on 05/27/2013.  (5)  status post hospitalization in early August 2014 secondary to a significant drop in hemoglobin leading to syncope and loss of consciousness.  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  Status post blood transfusion, 2 units of packed red blood cells, on 06/10/2013. Scheduled for blood transfusion again on 12-14.  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 She is now ready to proceed to surgery. She is already scheduled to see Dr. Derrell Lolling in 2 days. I suspect she will have her definitive surgery sometime in January. Accordingly I am making her a return appointment with me for February. By then we should have her definitive pathology results. We can then decide if she will need radiation. Once her local treatment has been completed she will start antiestrogen therapy.   Tabitha Ramirez has a good understanding of this overall plan. She agrees with it. She knows to call for any problems that may develop before next visit here.  Lowella Dell, MD   09/02/2013 2:53 PM

## 2013-09-02 NOTE — Telephone Encounter (Signed)
, °

## 2013-09-04 ENCOUNTER — Encounter (INDEPENDENT_AMBULATORY_CARE_PROVIDER_SITE_OTHER): Payer: Self-pay | Admitting: General Surgery

## 2013-09-04 ENCOUNTER — Ambulatory Visit (INDEPENDENT_AMBULATORY_CARE_PROVIDER_SITE_OTHER): Payer: Medicare Other | Admitting: General Surgery

## 2013-09-04 VITALS — BP 130/70 | HR 68 | Temp 98.0°F | Resp 18 | Ht 64.0 in | Wt 149.0 lb

## 2013-09-04 DIAGNOSIS — C50111 Malignant neoplasm of central portion of right female breast: Secondary | ICD-10-CM

## 2013-09-04 DIAGNOSIS — C50119 Malignant neoplasm of central portion of unspecified female breast: Secondary | ICD-10-CM

## 2013-09-04 NOTE — Patient Instructions (Signed)
The tumor in your right breast is smaller than it was in the chemotherapy has had a good affect. The MRI shows a good response as well.  I think that you are a candidate for lumpectomy and breast conservation. This will require removal of the entire nipple and areola. We have discussed this in detail today.  You will be scheduled for removal of Port-A-Cath, right partial mastectomy with needle localization, and right axillary lymph node dissection.  I will discuss with Dr. Darnelle Catalan and Dr. Michell Heinrich whether they think we should go ahead with lymph node dissection or just give radiation therapy to the axilla. The standard of care is right axillary lymph node dissection.  Radiation therapy might be just as good a cure rate, however.We will make that decision prior to the surgery.    Lumpectomy A lumpectomy is a form of "breast conserving" or "breast preservation" surgery. It may also be referred to as a partial mastectomy. During a lumpectomy, the portion of the breast that contains the cancerous tumor or breast mass (the lump) is removed. Some normal tissue around the lump may also be removed to make sure all the tumor has been removed. This surgery should take 40 minutes or less. LET [facility] CARE PROVIDER KNOW ABOUT:  Any allergies you have.  All medicines you are taking, including vitamins, herbs, eye drops, creams, and over-the-counter medicines.  Previous problems you or members of your family have had with the use of anesthetics.  Any blood disorders you have.  Previous surgeries you have had.  Medical conditions you have. RISKS AND COMPLICATIONS Generally, this is a safe procedure. However, as with any procedure, complications can occur. Possible complications include:  Bleeding.  Infection.  Pain.  Temporary swelling.  Change in the shape of the breast, particularly if a large portion is removed. BEFORE THE PROCEDURE  Ask your health care provider about changing or  stopping your regular medicines.  Do not eat or drink anything for 7 8 hours before the surgery or as directed by your health care provider. Ask your health care provider if you can take a sip of water with any approved medicines.  On the day of surgery, your healthcare provider will use a mammogram or ultrasound to locate and mark the tumor in your breast. These markings on your breast will show where the cut (incision) will be made. PROCEDURE   An IV tube will be put into one of your veins.  You may be given medicine to help you relax before the surgery (sedative). You will be given one of the following:  A medicine that numbs the area (local anesthesia).  A medicine that makes you go to sleep (general anesthesia).  Your health care provider will use a kind of electric scalpel that uses heat to minimize bleeding (electrocautery knife).  A curved incision (like a smile or frown) that follows the natural curve of your breast is made, to allow for minimal scarring and better healing.  The tumor will be removed with some of the surrounding tissue. This will be sent to the lab for analysis. Your health care provider may also remove your lymph nodes at this time if needed.  Sometimes, but not always, a rubber tube called a drain will be surgically inserted into your breast area or armpit to collect excess fluid that may accumulate in the space where the tumor was. This drain is connected to a plastic bulb on the outside of your body. This drain creates suction  to help remove the fluid.  The incisions will be closed with stitches (sutures).  A bandage may be placed over the incisions. AFTER THE PROCEDURE  You will be taken to the recovery area.  You will be given medicine for pain.  A small rubber drain may be placed in the breast for 2 3 days to prevent a collection of blood (hematoma) from developing in the breast. You will be given instructions on caring for the drain before you go  home.  A pressure bandage (dressing) will be applied for 1 2 days to prevent bleeding. Ask your health care provider how to care for your bandage at home. Document Released: 10/23/2006 Document Revised: 05/14/2013 Document Reviewed: 02/14/2013 [facility] Patient Information 2014 [place], [place].

## 2013-09-04 NOTE — Progress Notes (Signed)
Patient ID: Tabitha Ramirez, female   DOB: 1945-10-17, 67 y.o.   MRN: [id number]  Chief Complaint  Patient presents with  . Routine Post Op    rt breast    HPI Tabitha Ramirez is a 67 y.o. female.  She returns following neoadjuvant chemotherapy for decision making and planning of her definitive right breast surgery.  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  On 03/10/2013 she underwent injection of blue dye, right axillary sentinel node biopsy and Port-A-Cath insertion. One out of 2 lymph nodes was positive for metastatic carcinoma. She has been receiving chemotherapy. She developed a profound anemia and had a syncopal episode and was hospitalized in transfused. She had upper endoscopy, colonoscopy, and CT angiogram of the chest which did not show any source of bleeding and no pulmonary embolus.  End of treatment MRI was performed on 08/27/2013. This shows the tumor has now decreased to 1.5 cm. No adenopathy. Left breast normal.. This shows that the right breast mass has decreased from 3.5 cm to 1.5 cm.   The patient is still hoping for a lumpectomy. I drew pictures of what that might look like, telling her that she would need a central lumpectomy with removal of the nipple and areola. This is based on the assumption of the tumor continues to respond.  She is reasonably stable now. She does have a little bit of neuropathy. She is on neutropenic precautions. She says she thinks the breast cancer is smaller but still feels it  smaller but she can still feel it.  Dr. Darnelle Catalan states we can remove the Port-A-Cath. I asked him and Dr. Michell Heinrich to consider whether we need to do a complete axillary lymph node dissection or simply radiate the axilla. They are contemplating this and we will put her on for breast conference next week. I told the patient that we will tentatively schedule her  for axillary lymph node dissection unless the other physicians feel that they can radiate her with equivalent outcomes.  HPI  Past Medical History  Diagnosis Date  . Heart murmur   . Hypertension   . Anemia   . Allergy   . Arthritis   . Breast cancer   . Migraines   . Cancer     Past Surgical History  Procedure Laterality Date  . Abdominal hysterectomy      With bilateral salpingo-oophorectomy  . Portacath placement N/A 03/10/2013    Procedure: INSERTION PORT-A-CATH WITH FLUOROSCOPY AND ULTRASOUND;  Surgeon: Ernestene Mention, MD;  Location: [facility] OR;  Service: General;  Laterality: N/A;  . Axillary lymph node biopsy Right 03/10/2013    Procedure: AXILLARY LYMPH NODE BIOPSY;  Surgeon: Ernestene Mention, MD;  Location: [facility] OR;  Service: General;  Laterality: Right;  sentinel node with blue dye  . Esophagogastroduodenoscopy N/A 04/28/2013    Procedure: ESOPHAGOGASTRODUODENOSCOPY (EGD);  Surgeon: Graylin Shiver, MD;  Location: [facility];  Service: Endoscopy;  Laterality: N/A;  . Colonoscopy N/A 04/30/2013    Procedure: COLONOSCOPY;  Surgeon: Graylin Shiver, MD;  Location: [facility];  Service: Endoscopy;  Laterality: N/A;    Family History  Problem Relation Age of Onset  . Cancer Mother 53    Unknown type of cancer  . Congestive Heart Failure Mother   . Colon cancer Brother   . Diabetes Brother 12  . Heart disease Brother  AMI 05/22/2012  . Congestive Heart Failure Brother   . Cancer Maternal Grandmother 35    Unknown type of cancer    Social History History  Substance Use Topics  . Smoking status: Former Smoker    Types: Cigarettes  . Smokeless tobacco: Never Used     Comment: smoked in college 1 yr   . Alcohol Use: No     Comment: Wine occasionally    Allergies  Allergen Reactions  . Penicillins Rash    Current Outpatient Prescriptions  Medication Sig Dispense Refill  . Acetaminophen (TYLENOL EXTRA STRENGTH PO) Take 1 tablet by mouth as needed.      Marland Kitchen  HYDROcodone-acetaminophen (NORCO/VICODIN) 5-325 MG per tablet Take 1-2 tablets by mouth every 8 (eight) hours as needed for moderate pain.  30 tablet  0  . LORazepam (ATIVAN) 0.5 MG tablet Take 1 tablet (0.5 mg total) by mouth at bedtime as needed for anxiety.  20 tablet  0  . Multiple Vitamin (MULTIVITAMIN WITH MINERALS) TABS Take 1 tablet by mouth daily. LIIQUID - Takes 1 cap full daily      . ondansetron (ZOFRAN) 8 MG tablet       . pantoprazole (PROTONIX) 40 MG tablet Take 1 tablet (40 mg total) by mouth daily at 12 noon.  30 tablet  2  . traMADol (ULTRAM) 50 MG tablet Take 1 tablet (50 mg total) by mouth every 6 (six) hours as needed for pain.  60 tablet  0  . triamterene-hydrochlorothiazide (MAXZIDE-25) 37.5-25 MG per tablet Take 0.5 tablets by mouth daily.       No current facility-administered medications for this visit.    Review of Systems Review of Systems  Constitutional: Positive for fatigue. Negative for fever, chills and unexpected weight change.  HENT: Negative for congestion, hearing loss, sore throat, trouble swallowing and voice change.   Eyes: Negative for visual disturbance.  Respiratory: Negative for cough and wheezing.   Cardiovascular: Negative for chest pain, palpitations and leg swelling.  Gastrointestinal: Negative for nausea, vomiting, abdominal pain, diarrhea, constipation, blood in stool, abdominal distention and anal bleeding.  Genitourinary: Negative for hematuria, vaginal bleeding and difficulty urinating.  Musculoskeletal: Positive for arthralgias.  Skin: Negative for rash and wound.  Neurological: Negative for seizures, syncope and headaches.  Hematological: Negative for adenopathy. Does not bruise/bleed easily.  Psychiatric/Behavioral: Negative for confusion.    Blood pressure 130/70, pulse 68, temperature 98 F (36.7 C), resp. rate 18, height 5\' 4"  (1.626 m), weight 149 lb (67.586 kg).  Physical Exam Physical Exam  Constitutional: She is oriented  to person, place, and time. She appears well-developed and well-nourished. No distress.  HENT:  Head: Normocephalic and atraumatic.  Nose: Nose normal.  Mouth/Throat: No oropharyngeal exudate.  Eyes: Conjunctivae and EOM are normal. Pupils are equal, round, and reactive to light. Left eye exhibits no discharge. No scleral icterus.  Neck: Neck supple. No JVD present. No tracheal deviation present. No thyromegaly present.  Cardiovascular: Normal rate, regular rhythm, normal heart sounds and intact distal pulses.   No murmur heard. Pulmonary/Chest: Effort normal and breath sounds normal. No respiratory distress. She has no wheezes. She has no rales. She exhibits no tenderness.    Palpable mass behind the lateral half of the right areola and extending laterally. This feels 2 cm in size to me. The skin is minimally discolored. The tumor appears to be fixed to the dermis behind the areola. There is no other mass in either breast. Incision right axilla well  healed. No axillary adenopathy. No supraclavicular adenopathy.  Abdominal: Soft. Bowel sounds are normal. She exhibits no distension and no mass. There is no tenderness. There is no rebound and no guarding.  Musculoskeletal: She exhibits no edema and no tenderness.  Lymphadenopathy:    She has no cervical adenopathy.  Neurological: She is alert and oriented to person, place, and time. She exhibits normal muscle tone. Coordination normal.  Skin: Skin is warm. No rash noted. She is not diaphoretic. No erythema. No pallor.  Psychiatric: She has a normal mood and affect. Her behavior is normal. Judgment and thought content normal.    Data Reviewed Recent MRI. All of my old notes. Recent notes from [facility].. Case discussed with Dr. Darnelle Catalan.  Assessment    Locally advanced, triple negative invasive mammary carcinoma right breast, 12:00 central position, initially 4 cm, now  responding by MRI. Still quite noticeable on physical exam. Slightly larger on  physical exam and by MRI. I think she is a candidate for an attempt at central lumpectomy and breast conservation.  The patient desires breast conservation  Status post sentinel lymph node biopsy(positive), Port-A-Cath insertion, uneventful recovery   Hypertension  Chemotherapy-induced anemia requiring transfusion  History TAH and BSO     Plan    We had a very long talk about surgical treatment planning and definitive surgery. We agreed that we would proceed with right partial mastectomy as a central lumpectomy, Remove the Port-A-Cath, and plan a right axillary lymph node dissection.She knows that mastectomy is 1 other standard option.We agreed we would do a needle localization so that I could have a wire through the center of the tumor to help guide with posterior extent. We will remove the Port-A-Cath. We will schedule her for a right axillary lymph node dissection which I think is the standard of care here.  She knows that we will present her case at breast conference next Wednesday and I've asked Dr. Darnelle Catalan and Dr. Michell Heinrich to decide whether they think they can get across outcomes with axillary radiation.  I discussed the indications, details, techniques, and numerous risk of the surgery with her. She's where the risk of arm swelling, or numbness, bleeding, infection, cosmetic deformity, reoperation for positive margins, and other unforeseen consequences. She understands all these issues. All of her questions are answered. She agrees with this plan.       Angelia Mould. Derrell Lolling, M.D., [facility] General and Minimally invasive Surgery Breast and Colorectal Surgery Office:   [phone] Pager:   [phone]  09/04/2013, 9:18 AM

## 2013-09-10 ENCOUNTER — Telehealth (INDEPENDENT_AMBULATORY_CARE_PROVIDER_SITE_OTHER): Payer: Self-pay

## 2013-09-10 ENCOUNTER — Telehealth: Payer: Self-pay | Admitting: *Deleted

## 2013-09-10 DIAGNOSIS — C50119 Malignant neoplasm of central portion of unspecified female breast: Secondary | ICD-10-CM

## 2013-09-10 MED ORDER — GABAPENTIN 300 MG PO CAPS: 300.0000 mg | ORAL_CAPSULE | Freq: Every day | ORAL | Status: DC

## 2013-09-10 NOTE — Telephone Encounter (Signed)
Returned patient's call. Pt states she is starting to feel tingling in fingertips and base of the feet. Consulted with Zollie Scale, PA-C. Pt is being Rx gabapentin 300 mg Q HS only. #30, Refill x 2. I informed pt medicine can be picked up at pharmacy today.

## 2013-09-10 NOTE — Telephone Encounter (Signed)
I called the pt and let her know Dr Jacinto Halim message below.  She asked me about the wire to be placed and if it is necessary because she has a clip.  I told her Dr Derrell Lolling needs them to put the wire in the exact location of the cancer to be removed.  They will do an xray of it.  He will remove both the wire and clip at the time of surgery.  She understands.

## 2013-09-10 NOTE — Telephone Encounter (Signed)
Message copied by Ivory Broad on Wed Sep 10, 2013  3:27 PM ------      Message from: Ernestene Mention      Created: Wed Sep 10, 2013 12:16 PM       Please call the patient and tell her that Dr. Darnelle Catalan, Dr. Michell Heinrich, and I discussed her case in breast conference this morning.            The consensus view was that she needs to have and axillary lymph node dissection. She is not a candidate for radiation therapy to the axilla at this time.She understands that this was probably going to be the opinion. She is scheduled for surgery already.            hmi         ------

## 2013-09-12 ENCOUNTER — Telehealth (INDEPENDENT_AMBULATORY_CARE_PROVIDER_SITE_OTHER): Payer: Self-pay

## 2013-09-12 NOTE — Telephone Encounter (Signed)
I called the pt back.  She asked again about the needle loc and if she needed it done.  I explained the reason for it.

## 2013-09-12 NOTE — Telephone Encounter (Signed)
Message copied by Ivory Broad on Fri Sep 12, 2013 11:11 AM ------      Message from: Louie Casa      Created: Fri Sep 12, 2013  9:43 AM      Regarding: Dr. Derrell Lolling Surgery Questions      Contact: [phone]       Patient called and have some questions about her procedure on 09/29/2012 and if she still has to go to cancer center, please call her.            Thank you. ------

## 2013-09-12 NOTE — Telephone Encounter (Signed)
Returned patient's call.  Left message for her to call with questions.

## 2013-09-12 NOTE — Telephone Encounter (Signed)
Patient called back at this time.  Please call her when you can.

## 2013-09-15 ENCOUNTER — Encounter (HOSPITAL_COMMUNITY): Payer: Self-pay | Admitting: Pharmacy Technician

## 2013-09-16 ENCOUNTER — Encounter (HOSPITAL_COMMUNITY)
Admission: RE | Admit: 2013-09-16 | Discharge: 2013-09-16 | Disposition: A | Discharge status: Home / Self Care | Payer: Medicare Other | Source: Ambulatory Visit | Attending: General Surgery | Admitting: General Surgery

## 2013-09-16 ENCOUNTER — Encounter (HOSPITAL_COMMUNITY): Payer: Self-pay

## 2013-09-16 DIAGNOSIS — Z01812 Encounter for preprocedural laboratory examination: Secondary | ICD-10-CM | POA: Insufficient documentation

## 2013-09-16 LAB — COMPREHENSIVE METABOLIC PANEL
Alkaline Phosphatase: 89 U/L (ref 39–117)
BUN: 13 mg/dL (ref 6–23)
CO2: 27 mEq/L (ref 19–32)
Chloride: 101 mEq/L (ref 96–112)
GFR calc Af Amer: 72 mL/min — ABNORMAL LOW (ref >90)
GFR calc non Af Amer: 62 mL/min — ABNORMAL LOW (ref >90)
Glucose, Bld: 103 mg/dL — ABNORMAL HIGH (ref 70–99)
Potassium: 3.4 mEq/L — ABNORMAL LOW (ref 3.5–5.1)
Total Bilirubin: 0.3 mg/dL (ref 0.3–1.2)

## 2013-09-16 LAB — CBC WITH DIFFERENTIAL/PLATELET
Basophils Relative: 1 % (ref 0–1)
Eosinophils Relative: 1 % (ref 0–5)
HCT: 27.9 % — ABNORMAL LOW (ref 36.0–46.0)
Hemoglobin: 9.7 g/dL — ABNORMAL LOW (ref 12.0–15.0)
Lymphocytes Relative: 32 % (ref 12–46)
MCHC: 34.8 g/dL (ref 30.0–36.0)
Monocytes Relative: 12 % (ref 3–12)
Neutro Abs: 3.7 10*3/uL (ref 1.7–7.7)
Platelets: 286 10*3/uL (ref 150–400)
RBC: 2.92 MIL/uL — ABNORMAL LOW (ref 3.87–5.11)
WBC: 7.1 10*3/uL (ref 4.0–10.5)

## 2013-09-16 NOTE — Pre-Procedure Instructions (Signed)
CINCERE DEPREY  09/16/2013   Your procedure is scheduled on:  09/29/13  Report to [facility]  2 * 3 at 730 AM.  Call this number if you have problems the morning of surgery: [phone]   Remember:   Do not eat food or drink liquids after midnight.   Take these medicines the morning of surgery with A SIP OF WATER: neurontin,ativan,protonix   Do not wear jewelry, make-up or nail polish.  Do not wear lotions, powders, or perfumes. You may wear deodorant.  Do not shave 48 hours prior to surgery. Men may shave face and neck.  Do not bring valuables to the hospital.  [facility] is not responsible                  for any belongings or valuables.               Contacts, dentures or bridgework may not be worn into surgery.  Leave suitcase in the car. After surgery it may be brought to your room.  For patients admitted to the hospital, discharge time is determined by your                treatment team.               Patients discharged the day of surgery will not be allowed to drive  home.  Name and phone number of your driver:   Special Instructions: Shower using CHG 2 nights before surgery and the night before surgery.  If you shower the day of surgery use CHG.  Use special wash - you have one bottle of CHG for all showers.  You should use approximately 1/3 of the bottle for each shower.   Please read over the following fact sheets that you were given: Pain Booklet, Coughing and Deep Breathing and Surgical Site Infection Prevention

## 2013-09-17 NOTE — Progress Notes (Addendum)
Anesthesia Chart Review: Patient is a 67 year old female scheduled for right central lumpectomy with needle localization, right axillary lymph node dissection, removal of Port-a-cath on 09/29/13 by Dr. Derrell Lolling.  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 She underwent PAC insertion and right axillary SN biopsy on 03/10/13 with one of two lymph nodes positive for metastatic disease. PCP is listed as Dr. Lavina Hamman.  She has been followed at [facility]. HEM-ONC is Dr. Darnelle Catalan.  EKG on 04/27/13 showed NSR, right BBB, LAFB, bifascicular block. EKG appears stable since 02/28/13.     Echo on 04/28/13 showed:  - Left ventricle: The cavity size was normal. Systolic function was normal. The estimated ejection fraction was in the range of 55% to 60%. Wall motion was normal; there were no regional wall motion abnormalities. Features are consistent with a pseudonormal left ventricular filling pattern, with concomitant abnormal relaxation and increased filling pressure (grade 2 diastolic dysfunction). - Aortic valve: Mild regurgitation. - Mitral valve: Mild regurgitation. - Pulmonic valve: Mild regurgitation. - Tricuspid valve: Trivial regurgitation. - Pericardium: No pericardial effusion.  2V CXR on 02/28/13 showed: Stable cardiopulmonary appearance with no new focal or acute abnormality identified. 1V CXR on 03/10/13 showed 1. Left subclavian porta catheter tip in the right atrium. 2. No pneumothorax. 3. Interval cardiomegaly, pulmonary vascular congestion and possible mild interstitial pulmonary edema, all accentuated by the poor  inspiration.  Chest CTA on 04/27/13 showed: 1. No evidence of pulmonary embolism.  2. Moderate-sized pericardial effusion.  3. Mild cardiomegaly with left ventricular enlargement and left ventricular hypertrophy. Moderate LAD coronary atherosclerosis.  4. Mild COPD/emphysema. No acute pulmonary disease.  5. Approximate 2.5 cm fluid collection in the right axilla consistent with a post-operative seroma or resolving hematoma.  Preoperative labs noted. H/H stable at 9.7/27.9.  WBC 7.1, PT 286K.  She tolerated surgery earlier this year. Her EKG and labs appear stable.  No CV symptoms were documented at PAT. If no acute changes or new CV symptoms then I would anticipate that she could proceed as planned. Reviewed above with anesthesiologist Dr. Noreene Larsson who agrees with this plan.   Velna Ochs [facility] Short Stay Center/Anesthesiology Phone [phone] 09/23/2013 4:05 PM

## 2013-09-26 NOTE — H&P (Signed)
[name]   MRN:  [id number]   Description: 68 year old female  Provider: Adin Hector, MD  Department: [facility]         Diagnoses      Cancer of central portion of female breast, right    -  Primary      174.1          Current Vitals - Last Recorded      BP Pulse Temp(Src) Resp Ht Wt      130/70 68 98 F (36.7 C) 18 5\' 4"  (1.626 m) 149 lb (67.586 kg)      BMI - 25.56 kg/m2                             History and Physical     Adin Hector, MD    Status: Signed            Patient ID: Tabitha Ramirez, female   DOB: February 28, 1946, 68 y.o.   MRN: [id number]              HPI Tabitha Ramirez is a 68 y.o. female.  She returns following neoadjuvant chemotherapy for decision making and planning of her definitive right breast surgery.   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   On 03/10/2013 she underwent injection of blue dye, right axillary sentinel node biopsy and Port-A-Cath insertion. One out of 2 lymph nodes was positive for metastatic carcinoma. She has been receiving chemotherapy. She developed a profound anemia and had a syncopal episode and was hospitalized in transfused. She had upper endoscopy, colonoscopy, and CT angiogram of the chest which did not show any source of bleeding and no pulmonary embolus.   End of treatment MRI was performed on 08/27/2013. This shows the tumor has now decreased to 1.5 cm. No adenopathy. Left breast normal.. This shows that the right breast mass has decreased from 3.5 cm to 1.5 cm.    The patient is still hoping for a lumpectomy. I drew pictures of what that might look like, telling her that she would need a central lumpectomy with removal of the nipple and areola. This is based on the assumption of the tumor continues to respond.   She is reasonably stable now. She does have a little bit of  neuropathy. She is on neutropenic precautions. She says she thinks the breast cancer is smaller but still feels it  smaller but she can still feel it.   Dr. Jana Hakim states we can remove the Port-A-Cath. I asked him and Dr. Pablo Ledger to consider whether we need to do a complete axillary lymph node dissection or simply radiate the axilla. They are contemplating this and we will put her on for breast conference next week. I told the patient that we will tentatively schedule her for axillary lymph node dissection unless the other physicians feel that they can radiate her with equivalent outcomes.        Past Medical History   Diagnosis  Date   .  Heart murmur     .  Hypertension     .  Anemia     .  Allergy     .  Arthritis     .  Breast cancer     .  Migraines     .  Cancer           Past Surgical History   Procedure  Laterality  Date   .  Abdominal hysterectomy           With bilateral salpingo-oophorectomy   .  Portacath placement  N/A  03/10/2013       Procedure: INSERTION PORT-A-CATH WITH FLUOROSCOPY AND ULTRASOUND;  Surgeon: Adin Hector, MD;  Location: [facility];  Service: General;  Laterality: N/A;   .  Axillary lymph node biopsy  Right  03/10/2013       Procedure: AXILLARY LYMPH NODE BIOPSY;  Surgeon: Adin Hector, MD;  Location: [facility];  Service: General;  Laterality: Right;  sentinel node with blue dye   .  Esophagogastroduodenoscopy  N/A  04/28/2013       Procedure: ESOPHAGOGASTRODUODENOSCOPY (EGD);  Surgeon: Wonda Horner, MD;  Location: [facility];  Service: Endoscopy;  Laterality: N/A;   .  Colonoscopy  N/A  04/30/2013       Procedure: COLONOSCOPY;  Surgeon: Wonda Horner, MD;  Location: [facility];  Service: Endoscopy;  Laterality: N/A;         Family History   Problem  Relation  Age of Onset   .  Cancer  Mother  32       Unknown type of cancer   .  Congestive Heart Failure  Mother     .  Colon cancer  Brother     .  Diabetes  Brother  17   .  Heart disease   Brother         [facility]I 05/22/2012   .  Congestive Heart Failure  Brother     .  Cancer  Maternal Grandmother  44       Unknown type of cancer        Social History History   Substance Use Topics   .  Smoking status:  Former Smoker       Types:  Cigarettes   .  Smokeless tobacco:  Never Used         Comment: smoked in college 1 yr    .  Alcohol Use:  No         Comment: Wine occasionally         Allergies   Allergen  Reactions   .  Penicillins  Rash         Current Outpatient Prescriptions   Medication  Sig  Dispense  Refill   .  Acetaminophen (TYLENOL EXTRA STRENGTH PO)  Take 1 tablet by mouth as needed.         Marland Kitchen  HYDROcodone-acetaminophen (NORCO/VICODIN) 5-325 MG per tablet  Take 1-2 tablets by mouth every 8 (eight) hours as needed for moderate pain.   30 tablet   0   .  LORazepam (ATIVAN) 0.5 MG tablet  Take 1 tablet (0.5 mg total) by mouth at bedtime as needed for anxiety.   20 tablet   0   .  Multiple Vitamin (MULTIVITAMIN WITH MINERALS) TABS  Take 1 tablet by mouth daily. LIIQUID - Takes 1 cap full daily         .  ondansetron (ZOFRAN) 8 MG tablet           .  pantoprazole (PROTONIX) 40 MG tablet  Take 1 tablet (40 mg total) by mouth daily at 12 noon.   30 tablet   2   .  traMADol (ULTRAM) 50 MG tablet  Take 1  tablet (50 mg total) by mouth every 6 (six) hours as needed for pain.   60 tablet   0   .  triamterene-hydrochlorothiazide (MAXZIDE-25) 37.5-25 MG per tablet  Take 0.5 tablets by mouth daily.             No current facility-administered medications for this visit.        Review of Systems   Constitutional: Positive for fatigue. Negative for fever, chills and unexpected weight change.  HENT: Negative for congestion, hearing loss, sore throat, trouble swallowing and voice change.   Eyes: Negative for visual disturbance.  Respiratory: Negative for cough and wheezing.   Cardiovascular: Negative for chest pain, palpitations and leg swelling.   Gastrointestinal: Negative for nausea, vomiting, abdominal pain, diarrhea, constipation, blood in stool, abdominal distention and anal bleeding.  Genitourinary: Negative for hematuria, vaginal bleeding and difficulty urinating.  Musculoskeletal: Positive for arthralgias.  Skin: Negative for rash and wound.  Neurological: Negative for seizures, syncope and headaches.  Hematological: Negative for adenopathy. Does not bruise/bleed easily.  Psychiatric/Behavioral: Negative for confusion.      Blood pressure 130/70, pulse 68, temperature 98 F (36.7 C), resp. rate 18, height 5\' 4"  (1.626 m), weight 149 lb (67.586 kg).   Physical Exam   Constitutional: She is oriented to person, place, and time. She appears well-developed and well-nourished. No distress.  HENT:   Head: Normocephalic and atraumatic.   Nose: Nose normal.   Mouth/Throat: No oropharyngeal exudate.  Eyes: Conjunctivae and EOM are normal. Pupils are equal, round, and reactive to light. Left eye exhibits no discharge. No scleral icterus.  Neck: Neck supple. No JVD present. No tracheal deviation present. No thyromegaly present.  Cardiovascular: Normal rate, regular rhythm, normal heart sounds and intact distal pulses.    No murmur heard. Pulmonary/Chest: Effort normal and breath sounds normal. No respiratory distress. She has no wheezes. She has no rales. She exhibits no tenderness.    Palpable mass behind the lateral half of the right areola and extending laterally. This feels 2 cm in size to me. The skin is minimally discolored. The tumor appears to be fixed to the dermis behind the areola. There is no other mass in either breast. Incision right axilla well healed. No axillary adenopathy. No supraclavicular adenopathy.  Abdominal: Soft. Bowel sounds are normal. She exhibits no distension and no mass. There is no tenderness. There is no rebound and no guarding.  Musculoskeletal: She exhibits no edema and no tenderness.   Lymphadenopathy:    She has no cervical adenopathy.  Neurological: She is alert and oriented to person, place, and time. She exhibits normal muscle tone. Coordination normal.  Skin: Skin is warm. No rash noted. She is not diaphoretic. No erythema. No pallor.  Psychiatric: She has a normal mood and affect. Her behavior is normal. Judgment and thought content normal.      Data Reviewed Recent MRI. All of my old notes. Recent notes from [facility].. Case discussed with Dr. Jana Hakim.   Assessment     Locally advanced, triple negative invasive mammary carcinoma right breast, 12:00 central position, initially 4 cm, now  responding by MRI. Still quite noticeable on physical exam. Slightly larger on physical exam and by MRI. I think she is a candidate for an attempt at central lumpectomy and breast conservation.   The patient desires breast conservation   Status post sentinel lymph node biopsy(positive), Port-A-Cath insertion, uneventful recovery    Hypertension   Chemotherapy-induced anemia requiring transfusion   History TAH  and BSO       Plan    We had a very long talk about surgical treatment planning and definitive surgery. We agreed that we would proceed with right partial mastectomy as a central lumpectomy, Remove the Port-A-Cath, and plan a right axillary lymph node dissection.She knows that mastectomy is 1 other standard option.We agreed we would do a needle localization so that I could have a wire through the center of the tumor to help guide with posterior extent. We will remove the Port-A-Cath. We will schedule her for a right axillary lymph node dissection which I think is the standard of care here.   She knows that we will present her case at breast conference next Wednesday and I've asked Dr. Jana Hakim and Dr. Pablo Ledger to decide whether they think they can get equivaleny outcomes with axillary radiation. ADDENDUM:Management of axilla discussed with Dr. Pablo Ledger and Dr.  Jana Hakim. Presented at breast tumor board. Consennsus was to proceed with axillary lymph node dissection due to lack of level one data supporting radiation therapy as alternative.   I discussed the indications, details, techniques, and numerous risk of the surgery with her. She's where the risk of arm swelling, or numbness, bleeding, infection, cosmetic deformity, reoperation for positive margins, and other unforeseen consequences. She understands all these issues. All of her questions are answered. She agrees with this plan.          Edsel Petrin. Dalbert Batman, M.D., [facility] General and Minimally invasive Surgery Breast and Colorectal Surgery Office:   [phone] Pager:   [phone]

## 2013-09-28 MED ORDER — CEFAZOLIN SODIUM-DEXTROSE 2-3 GM-% IV SOLR
2.0000 g | INTRAVENOUS | Status: DC
  Filled 2013-09-28: qty 50

## 2013-09-29 ENCOUNTER — Ambulatory Visit (HOSPITAL_COMMUNITY): Payer: Medicare Other | Admitting: Vascular Surgery

## 2013-09-29 ENCOUNTER — Ambulatory Visit (HOSPITAL_COMMUNITY)
Admission: RE | Admit: 2013-09-29 | Discharge: 2013-09-30 | Disposition: A | Discharge status: Home / Self Care | Payer: Medicare Other | Source: Ambulatory Visit | Attending: General Surgery | Admitting: General Surgery

## 2013-09-29 ENCOUNTER — Ambulatory Visit
Admission: RE | Admit: 2013-09-29 | Discharge: 2013-09-29 | Disposition: A | Discharge status: Home / Self Care | Payer: Medicare Other | Source: Ambulatory Visit | Attending: General Surgery | Admitting: General Surgery

## 2013-09-29 ENCOUNTER — Encounter (HOSPITAL_COMMUNITY)
Admission: RE | Disposition: A | Discharge status: Home / Self Care | Payer: Self-pay | Source: Ambulatory Visit | Attending: General Surgery

## 2013-09-29 ENCOUNTER — Encounter (HOSPITAL_COMMUNITY): Payer: Medicare Other | Admitting: Vascular Surgery

## 2013-09-29 ENCOUNTER — Encounter (HOSPITAL_COMMUNITY): Payer: Self-pay | Admitting: *Deleted

## 2013-09-29 DIAGNOSIS — I10 Essential (primary) hypertension: Secondary | ICD-10-CM | POA: Insufficient documentation

## 2013-09-29 DIAGNOSIS — C50919 Malignant neoplasm of unspecified site of unspecified female breast: Secondary | ICD-10-CM | POA: Diagnosis present

## 2013-09-29 DIAGNOSIS — Z452 Encounter for adjustment and management of vascular access device: Secondary | ICD-10-CM

## 2013-09-29 DIAGNOSIS — C50119 Malignant neoplasm of central portion of unspecified female breast: Secondary | ICD-10-CM | POA: Insufficient documentation

## 2013-09-29 DIAGNOSIS — N6089 Other benign mammary dysplasias of unspecified breast: Secondary | ICD-10-CM | POA: Insufficient documentation

## 2013-09-29 DIAGNOSIS — C50111 Malignant neoplasm of central portion of right female breast: Secondary | ICD-10-CM

## 2013-09-29 DIAGNOSIS — C773 Secondary and unspecified malignant neoplasm of axilla and upper limb lymph nodes: Secondary | ICD-10-CM | POA: Insufficient documentation

## 2013-09-29 DIAGNOSIS — Z9221 Personal history of antineoplastic chemotherapy: Secondary | ICD-10-CM | POA: Insufficient documentation

## 2013-09-29 DIAGNOSIS — T451X5A Adverse effect of antineoplastic and immunosuppressive drugs, initial encounter: Secondary | ICD-10-CM | POA: Insufficient documentation

## 2013-09-29 DIAGNOSIS — Z87891 Personal history of nicotine dependence: Secondary | ICD-10-CM | POA: Insufficient documentation

## 2013-09-29 DIAGNOSIS — D6481 Anemia due to antineoplastic chemotherapy: Secondary | ICD-10-CM | POA: Insufficient documentation

## 2013-09-29 HISTORY — DX: Gastro-esophageal reflux disease without esophagitis: K21.9

## 2013-09-29 HISTORY — PX: PORT-A-CATH REMOVAL: SHX5289

## 2013-09-29 HISTORY — PX: MASTECTOMY: SHX3

## 2013-09-29 HISTORY — DX: Personal history of other medical treatment: Z92.89

## 2013-09-29 HISTORY — DX: Unspecified temporomandibular joint disorder, unspecified side: M26.609

## 2013-09-29 HISTORY — PX: BREAST LUMPECTOMY WITH NEEDLE LOCALIZATION AND AXILLARY LYMPH NODE DISSECTION: SHX5758

## 2013-09-29 HISTORY — PX: BREAST LUMPECTOMY: SHX2

## 2013-09-29 SURGERY — BREAST LUMPECTOMY WITH NEEDLE LOCALIZATION AND AXILLARY LYMPH NODE DISSECTION
Anesthesia: General | Site: Chest | Laterality: Right

## 2013-09-29 MED ORDER — LACTATED RINGERS IV SOLN
INTRAVENOUS | Status: DC | PRN
  Administered 2013-09-29: 09:00:00 via INTRAVENOUS

## 2013-09-29 MED ORDER — MIDAZOLAM HCL 2 MG/2ML IJ SOLN: 1.0000 mg | INTRAMUSCULAR | Status: DC | PRN

## 2013-09-29 MED ORDER — BUPIVACAINE-EPINEPHRINE (PF) 0.25% -1:200000 IJ SOLN
INTRAMUSCULAR | Status: AC
  Filled 2013-09-29: qty 30

## 2013-09-29 MED ORDER — LACTATED RINGERS IV SOLN
INTRAVENOUS | Status: DC
  Administered 2013-09-29: 10:00:00 via INTRAVENOUS

## 2013-09-29 MED ORDER — ARTIFICIAL TEARS OP OINT
TOPICAL_OINTMENT | OPHTHALMIC | Status: DC | PRN
  Administered 2013-09-29: 1 via OPHTHALMIC

## 2013-09-29 MED ORDER — OXYCODONE HCL 5 MG PO TABS: 5.0000 mg | ORAL_TABLET | Freq: Once | ORAL | Status: DC | PRN

## 2013-09-29 MED ORDER — LORAZEPAM 0.5 MG PO TABS: 0.5000 mg | ORAL_TABLET | Freq: Every evening | ORAL | Status: DC | PRN

## 2013-09-29 MED ORDER — TRAMADOL HCL 50 MG PO TABS: 50.0000 mg | ORAL_TABLET | Freq: Four times a day (QID) | ORAL | Status: DC | PRN

## 2013-09-29 MED ORDER — ONDANSETRON HCL 4 MG PO TABS: 4.0000 mg | ORAL_TABLET | Freq: Four times a day (QID) | ORAL | Status: DC | PRN

## 2013-09-29 MED ORDER — CHLORHEXIDINE GLUCONATE 4 % EX LIQD: 1.0000 "application " | Freq: Once | CUTANEOUS | Status: DC

## 2013-09-29 MED ORDER — FENTANYL CITRATE 0.05 MG/ML IJ SOLN: 12.5000 ug | INTRAMUSCULAR | Status: DC | PRN

## 2013-09-29 MED ORDER — PROPOFOL 10 MG/ML IV BOLUS
INTRAVENOUS | Status: DC | PRN
  Administered 2013-09-29: 120 mg via INTRAVENOUS

## 2013-09-29 MED ORDER — ONDANSETRON HCL 4 MG/2ML IJ SOLN: 4.0000 mg | Freq: Four times a day (QID) | INTRAMUSCULAR | Status: DC | PRN

## 2013-09-29 MED ORDER — ENOXAPARIN SODIUM 40 MG/0.4ML ~~LOC~~ SOLN
40.0000 mg | SUBCUTANEOUS | Status: DC
  Administered 2013-09-30: 40 mg via SUBCUTANEOUS
  Filled 2013-09-29: qty 0.4

## 2013-09-29 MED ORDER — LIDOCAINE HCL (CARDIAC) 20 MG/ML IV SOLN
INTRAVENOUS | Status: DC | PRN
  Administered 2013-09-29: 60 mg via INTRAVENOUS

## 2013-09-29 MED ORDER — PROMETHAZINE HCL 25 MG/ML IJ SOLN: 6.2500 mg | INTRAMUSCULAR | Status: DC | PRN

## 2013-09-29 MED ORDER — FENTANYL CITRATE 0.05 MG/ML IJ SOLN
INTRAMUSCULAR | Status: DC | PRN
  Administered 2013-09-29 (×4): 25 ug via INTRAVENOUS
  Administered 2013-09-29: 50 ug via INTRAVENOUS

## 2013-09-29 MED ORDER — CIPROFLOXACIN IN D5W 400 MG/200ML IV SOLN
400.0000 mg | Freq: Once | INTRAVENOUS | Status: AC
  Administered 2013-09-29: 400 mg via INTRAVENOUS
  Filled 2013-09-29: qty 200

## 2013-09-29 MED ORDER — FENTANYL CITRATE 0.05 MG/ML IJ SOLN: 50.0000 ug | Freq: Once | INTRAMUSCULAR | Status: DC

## 2013-09-29 MED ORDER — HEMOSTATIC AGENTS (NO CHARGE) OPTIME
TOPICAL | Status: DC | PRN
  Administered 2013-09-29 (×2): 1 via TOPICAL

## 2013-09-29 MED ORDER — OXYCODONE HCL 5 MG/5ML PO SOLN: 5.0000 mg | Freq: Once | ORAL | Status: DC | PRN

## 2013-09-29 MED ORDER — GABAPENTIN 300 MG PO CAPS
300.0000 mg | ORAL_CAPSULE | Freq: Every day | ORAL | Status: DC
  Administered 2013-09-29: 300 mg via ORAL
  Filled 2013-09-29 (×2): qty 1

## 2013-09-29 MED ORDER — PANTOPRAZOLE SODIUM 40 MG PO TBEC: 40.0000 mg | DELAYED_RELEASE_TABLET | Freq: Every day | ORAL | Status: DC

## 2013-09-29 MED ORDER — MIDAZOLAM HCL 5 MG/5ML IJ SOLN
INTRAMUSCULAR | Status: DC | PRN
  Administered 2013-09-29: 1 mg via INTRAVENOUS

## 2013-09-29 MED ORDER — POTASSIUM CHLORIDE IN NACL 20-0.9 MEQ/L-% IV SOLN
INTRAVENOUS | Status: DC
  Administered 2013-09-29: 14:00:00 via INTRAVENOUS
  Administered 2013-09-30: 100 mL/h via INTRAVENOUS
  Filled 2013-09-29 (×5): qty 1000

## 2013-09-29 MED ORDER — 0.9 % SODIUM CHLORIDE (POUR BTL) OPTIME
TOPICAL | Status: DC | PRN
  Administered 2013-09-29 (×2): 1000 mL

## 2013-09-29 MED ORDER — HEPARIN SODIUM (PORCINE) 5000 UNIT/ML IJ SOLN
5000.0000 [IU] | Freq: Once | INTRAMUSCULAR | Status: AC
Start: 2013-09-29 — End: 2013-09-29
  Administered 2013-09-29: 5000 [IU] via SUBCUTANEOUS
  Filled 2013-09-29: qty 1

## 2013-09-29 MED ORDER — HYDROMORPHONE HCL PF 1 MG/ML IJ SOLN
INTRAMUSCULAR | Status: AC
  Filled 2013-09-29: qty 1

## 2013-09-29 MED ORDER — OXYCODONE-ACETAMINOPHEN 5-325 MG PO TABS: 1.0000 | ORAL_TABLET | ORAL | Status: DC | PRN

## 2013-09-29 MED ORDER — PHENYLEPHRINE HCL 10 MG/ML IJ SOLN
INTRAMUSCULAR | Status: DC | PRN
  Administered 2013-09-29: 120 ug via INTRAVENOUS
  Administered 2013-09-29: 40 ug via INTRAVENOUS
  Administered 2013-09-29 (×2): 120 ug via INTRAVENOUS

## 2013-09-29 MED ORDER — TRIAMTERENE-HCTZ 37.5-25 MG PO TABS
0.5000 | ORAL_TABLET | Freq: Every day | ORAL | Status: DC
  Administered 2013-09-29 – 2013-09-30 (×2): 0.5 via ORAL
  Filled 2013-09-29 (×2): qty 0.5

## 2013-09-29 MED ORDER — ONDANSETRON HCL 4 MG/2ML IJ SOLN
INTRAMUSCULAR | Status: DC | PRN
  Administered 2013-09-29: 4 mg via INTRAVENOUS

## 2013-09-29 MED ORDER — BUPIVACAINE-EPINEPHRINE 0.25% -1:200000 IJ SOLN
INTRAMUSCULAR | Status: DC | PRN
  Administered 2013-09-29: 22 mL

## 2013-09-29 MED ORDER — HYDROMORPHONE HCL PF 1 MG/ML IJ SOLN
0.2500 mg | INTRAMUSCULAR | Status: DC | PRN
  Administered 2013-09-29: 0.5 mg via INTRAVENOUS

## 2013-09-29 MED ORDER — LIDOCAINE-EPINEPHRINE (PF) 1 %-1:200000 IJ SOLN
INTRAMUSCULAR | Status: AC
  Filled 2013-09-29: qty 10

## 2013-09-29 MED ORDER — PHENYLEPHRINE HCL 10 MG/ML IJ SOLN
10.0000 mg | INTRAVENOUS | Status: DC | PRN
  Administered 2013-09-29: 25 ug/min via INTRAVENOUS

## 2013-09-29 SURGICAL SUPPLY — 58 items
APPLIER CLIP 9.375 MED OPEN (MISCELLANEOUS) ×8
BINDER BREAST LRG (GAUZE/BANDAGES/DRESSINGS) ×4 IMPLANT
BLADE SURG 15 STRL LF DISP TIS (BLADE) ×2 IMPLANT
BLADE SURG 15 STRL SS (BLADE) ×2
CHLORAPREP W/TINT 10.5 ML (MISCELLANEOUS) IMPLANT
CHLORAPREP W/TINT 26ML (MISCELLANEOUS) ×4 IMPLANT
CLIP APPLIE 9.375 MED OPEN (MISCELLANEOUS) ×4 IMPLANT
COVER SURGICAL LIGHT HANDLE (MISCELLANEOUS) ×4 IMPLANT
DECANTER SPIKE VIAL GLASS SM (MISCELLANEOUS) IMPLANT
DERMABOND ADHESIVE PROPEN (GAUZE/BANDAGES/DRESSINGS) ×4
DERMABOND ADVANCED .7 DNX6 (GAUZE/BANDAGES/DRESSINGS) ×4 IMPLANT
DEVICE DUBIN SPECIMEN MAMMOGRA (MISCELLANEOUS) ×4 IMPLANT
DRAIN CHANNEL 19F RND (DRAIN) ×4 IMPLANT
DRAPE LAPAROSCOPIC ABDOMINAL (DRAPES) ×4 IMPLANT
DRAPE PED LAPAROTOMY (DRAPES) IMPLANT
DRAPE UTILITY 15X26 W/TAPE STR (DRAPE) ×12 IMPLANT
DRSG PAD ABDOMINAL 8X10 ST (GAUZE/BANDAGES/DRESSINGS) ×8 IMPLANT
ELECT REM PT RETURN 9FT ADLT (ELECTROSURGICAL) ×4
ELECTRODE REM PT RTRN 9FT ADLT (ELECTROSURGICAL) ×2 IMPLANT
EVACUATOR SILICONE 100CC (DRAIN) ×4 IMPLANT
GAUZE SPONGE 4X4 16PLY XRAY LF (GAUZE/BANDAGES/DRESSINGS) ×4 IMPLANT
GLOVE BIO SURGEON STRL SZ7 (GLOVE) ×4 IMPLANT
GLOVE BIOGEL PI IND STRL 7.0 (GLOVE) ×6 IMPLANT
GLOVE BIOGEL PI INDICATOR 7.0 (GLOVE) ×6
GLOVE EUDERMIC 7 POWDERFREE (GLOVE) ×4 IMPLANT
GLOVE SURG SS PI 7.0 STRL IVOR (GLOVE) ×4 IMPLANT
GOWN STRL NON-REIN LRG LVL3 (GOWN DISPOSABLE) ×8 IMPLANT
GOWN STRL REIN XL XLG (GOWN DISPOSABLE) ×4 IMPLANT
HEMOSTAT SURGICEL 2X14 (HEMOSTASIS) ×8 IMPLANT
KIT BASIN OR (CUSTOM PROCEDURE TRAY) ×4 IMPLANT
KIT MARKER MARGIN INK (KITS) ×4 IMPLANT
KIT ROOM TURNOVER OR (KITS) ×4 IMPLANT
NEEDLE HYPO 25GX1X1/2 BEV (NEEDLE) ×4 IMPLANT
NS IRRIG 1000ML POUR BTL (IV SOLUTION) ×8 IMPLANT
PACK SURGICAL SETUP 50X90 (CUSTOM PROCEDURE TRAY) ×4 IMPLANT
PAD ARMBOARD 7.5X6 YLW CONV (MISCELLANEOUS) ×4 IMPLANT
PENCIL BUTTON HOLSTER BLD 10FT (ELECTRODE) ×4 IMPLANT
SPECIMEN JAR MEDIUM (MISCELLANEOUS) ×4 IMPLANT
SPONGE GAUZE 4X4 12PLY (GAUZE/BANDAGES/DRESSINGS) ×4 IMPLANT
SPONGE LAP 18X18 X RAY DECT (DISPOSABLE) ×8 IMPLANT
SUT ETHILON 2 0 FS 18 (SUTURE) ×4 IMPLANT
SUT MNCRL AB 4-0 PS2 18 (SUTURE) ×12 IMPLANT
SUT SILK 2 0 SH (SUTURE) ×4 IMPLANT
SUT VIC AB 2-0 CT1 27 (SUTURE) ×6
SUT VIC AB 2-0 CT1 TAPERPNT 27 (SUTURE) ×6 IMPLANT
SUT VIC AB 2-0 SH 27 (SUTURE) ×2
SUT VIC AB 2-0 SH 27X BRD (SUTURE) ×2 IMPLANT
SUT VIC AB 3-0 SH 18 (SUTURE) ×8 IMPLANT
SUT VIC AB 3-0 SH 27 (SUTURE) ×2
SUT VIC AB 3-0 SH 27XBRD (SUTURE) ×2 IMPLANT
SYR BULB 3OZ (MISCELLANEOUS) ×4 IMPLANT
SYR CONTROL 10ML LL (SYRINGE) ×4 IMPLANT
TOWEL OR 17X24 6PK STRL BLUE (TOWEL DISPOSABLE) ×4 IMPLANT
TOWEL OR 17X26 10 PK STRL BLUE (TOWEL DISPOSABLE) ×4 IMPLANT
TUBE CONNECTING 12'X1/4 (SUCTIONS) ×1
TUBE CONNECTING 12X1/4 (SUCTIONS) ×3 IMPLANT
WATER STERILE IRR 1000ML POUR (IV SOLUTION) IMPLANT
YANKAUER SUCT BULB TIP NO VENT (SUCTIONS) ×4 IMPLANT

## 2013-09-29 NOTE — Interval H&P Note (Signed)
History and Physical Interval Note:  09/29/2013 8:39 AM  Tabitha Ramirez  has presented today for surgery, with the diagnosis of right breast cancer   The goals and the various methods of treatment have been discussed with the patient and family. After consideration of risks, benefits and other options for treatment, the patient has consented to  Procedure(s) with comments: BREAST LUMPECTOMY WITH NEEDLE LOCALIZATION AND AXILLARY LYMPH NODE DISSECTION (Right) - NL BCG 8 REMOVAL PORT-A-CATH (N/A) as a surgical intervention .  The patient's history has been reviewed, patient examined today, no change in status, stable for surgery.  I have reviewed the patient's chart and labs.  Questions were answered to the patient's satisfaction.     Adin Hector

## 2013-09-29 NOTE — Transfer of Care (Signed)
Immediate Anesthesia Transfer of Care Note  Patient: Tabitha Ramirez  Procedure(s) Performed: Procedure(s): RIGHT BREAST NEEDLE LOCALIZED LUMPECTOMY AND AXILLARY LYMPH NODE DISSECTION (Right) REMOVAL PORT-A-CATH (Left)  Patient Location: PACU  Anesthesia Type:General  Level of Consciousness: awake, alert  and oriented  Airway & Oxygen Therapy: Patient Spontanous Breathing and Patient connected to nasal cannula oxygen  Post-op Assessment: Report given to PACU RN and Post -op Vital signs reviewed and stable  Post vital signs: Reviewed and stable  Complications: No apparent anesthesia complications

## 2013-09-29 NOTE — Anesthesia Postprocedure Evaluation (Signed)
  Anesthesia Post-op Note  Patient: Tabitha Ramirez  Procedure(s) Performed: Procedure(s): RIGHT BREAST NEEDLE LOCALIZED LUMPECTOMY AND AXILLARY LYMPH NODE DISSECTION (Right) REMOVAL PORT-A-CATH (Left)  Patient Location: PACU  Anesthesia Type:General  Level of Consciousness: awake and alert   Airway and Oxygen Therapy: Patient Spontanous Breathing  Post-op Pain: mild  Post-op Assessment: Post-op Vital signs reviewed  Post-op Vital Signs: stable  Complications: No apparent anesthesia complications

## 2013-09-29 NOTE — Preoperative (Signed)
Beta Blockers   Reason not to administer Beta Blockers:Not Applicable 

## 2013-09-29 NOTE — Anesthesia Procedure Notes (Signed)
Procedure Name: LMA Insertion Date/Time: 09/29/2013 9:45 AM Performed by: Erik Obey Pre-anesthesia Checklist: Patient identified, Timeout performed, Emergency Drugs available, Suction available and Patient being monitored Patient Re-evaluated:Patient Re-evaluated prior to inductionOxygen Delivery Method: Circle system utilized Preoxygenation: Pre-oxygenation with 100% oxygen Intubation Type: IV induction Ventilation: Mask ventilation without difficulty LMA: LMA inserted LMA Size: 4.0 Number of attempts: 1 Placement Confirmation: positive ETCO2 and breath sounds checked- equal and bilateral Tube secured with: Tape Dental Injury: Teeth and Oropharynx as per pre-operative assessment

## 2013-09-29 NOTE — Anesthesia Preprocedure Evaluation (Addendum)
Anesthesia Evaluation  Patient identified by MRN, date of birth, ID band Patient awake    Reviewed: Allergy & Precautions, H&P , NPO status , Patient's Chart, lab work & pertinent test results  Airway Mallampati: I TM Distance: >3 FB Neck ROM: Full  Mouth opening: Limited Mouth Opening  Dental  (+) Teeth Intact   Pulmonary neg pulmonary ROS, former smoker,  breath sounds clear to auscultation        Cardiovascular hypertension, Rhythm:Regular Rate:Normal + Systolic murmurs    Neuro/Psych  Headaches, negative neurological ROS     GI/Hepatic negative GI ROS, Neg liver ROS,   Endo/Other  negative endocrine ROS  Renal/GU negative Renal ROS     Musculoskeletal   Abdominal   Peds  Hematology negative hematology ROS (+) anemia ,   Anesthesia Other Findings Breast ca TMJ  Reproductive/Obstetrics                        Anesthesia Physical Anesthesia Plan  ASA: II  Anesthesia Plan: General   Post-op Pain Management:    Induction: Intravenous  Airway Management Planned: LMA  Additional Equipment:   Intra-op Plan:   Post-operative Plan: Extubation in OR  Informed Consent: I have reviewed the patients History and Physical, chart, labs and discussed the procedure including the risks, benefits and alternatives for the proposed anesthesia with the patient or authorized representative who has indicated his/her understanding and acceptance.     Plan Discussed with: CRNA and Surgeon  Anesthesia Plan Comments:         Anesthesia Quick Evaluation

## 2013-09-29 NOTE — Op Note (Signed)
Patient Name:           Tabitha Ramirez   Date of Surgery:        09/29/2013  Pre op Diagnosis:      Locally advanced, triple negative invasive mammary carcinoma right breast, 12:00 central position, initially 4 cm, (T2, N1)  Status post neoadjuvant chemotherapy with partial response by MRI   The patient desires breast conservation   Status post sentinel lymph node biopsy(positive), Port-A-Cath insertion  Chemotherapy-induced anemia requiring transfusion     Post op Diagnosis:    Same  Procedure:                 Removal of Port-A-Cath Right central lumpectomy with needle localization and specimen mammogram (14 X 8 cm defect) Tissue transfer (14 cm X 8 cm defect) Right axillary lymph node dissection, complete  Surgeon:                     Edsel Petrin. Dalbert Batman, M.D., [place]  Assistant:                      None  Operative Indications:   Tabitha Ramirez is a 68 y.o. female.  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  On 03/10/2013 she underwent injection of blue dye, right axillary sentinel node biopsy and Port-A-Cath insertion. One out of 2 lymph nodes was positive for metastatic carcinoma. She has been receiving chemotherapy. She developed a profound anemia and had a syncopal episode and was hospitalized in transfused. She had upper endoscopy, colonoscopy, and CT angiogram of the chest which did not show any source of bleeding and no pulmonary embolus.  End of treatment MRI was performed on 08/27/2013. This shows the tumor has now decreased to 1.5 cm. No adenopathy. Left breast normal.. This shows that the right breast mass has decreased from 3.5 cm to 1.5 cm. On exam, however, the central mass behind the areola feels a little bit larger. The skin is not invaded, however. The patient is still hoping for a lumpectomy. I drew pictures of what that might look like, telling her that she  would need a central lumpectomy with removal of the nipple and areola.  She is reasonably stable now. She does have a little bit of neuropathy. She understands that if she has multiple positive margins she will need a mastectomy. Dr. Jana Hakim states we can remove the Port-A-Cath. Her case was presented in tumor burden. The consensus was that we need to proceed with a right axillary lymph node dissection since there is no good data currently on radiating the axilla for axillary control in this setting.   Operative Findings:       The port and catheter were removed intact. There is no sign of infection or bleeding. The central lumpectomy was fairly generous, transverse elliptical incision 14 cm transversely by 8 cm vertically. This required extensive undermining at the level of the pectoralis fascia superiorly and inferiorly to do a tissue transfer to bring the breast tissue back together. This looked good at the end but there was volume loss. We removed level one and level II lymph nodes from the right axilla. There were no obviously pathologic nodes but there was a lot of scarring.  Procedure in Detail:          The patient underwent wire localization at the breast  center of [place].  The wire was placed from a superior position and went inferiorly down to the marker clip in the anterior - center of the breast. The patient was brought to the operating room where general anesthesia with an LMA device was used. Intravenous antibiotics were given. The patient's right breast, right axilla, left breast, and neck and infraclavicular areas were prepped and draped in sterile fashion. Surgical time out was performed. 0.5% Marcaine with epinephrine was used as local infiltration anesthetic.  I removed the port first. I made a transverse incision in the left infraclavicular area through the old scar. Dissection was carried down to the port. The port was dissected away from the capsule. All 3 Prolene sutures were  cut and removed. The port and catheter were then removed intact. Subcutaneous tissue was closed with 3-0 Vicryl sutures and the skin closed with a running subcuticular suture of 4-0 Monocryl and Dermabond.  I then performed the right central lumpectomy. I used a marking pen to mark the extent of my incision. This was 8 cm vertically by 14 cm transversely. The incision was made. Dissection was carried down widely into the breast tissue essentially to the pectoralis fascia. The specimen was removed. It was marked with silk sutures and the ink kit to orient the pathologist. Specimen mammogram looked good with the marker clip and wire in the center of the specimen. This was marked and sent to the lab. Hemostasis was excellent and achieved with electrocautery. Using electrocautery I undermined the breast parenchyma away from the pectoralis major muscle superiorly and inferiorly throughout the extent of the incision until I determined that I could bring the  tissues back together.  I then closed the breast tissues with numerous interrupted sutures of 2-0 Vicryl and 3-0 Vicryl and closed the skin with a running subcuticular suture of 4-0 Monocryl and Dermabond.  I mede a transverse incision in the right axilla, using the previous scar. Dissection was carried down through subcutaneous tissue. I incised the clavipectoral fascia. I identified the lateral border of the pectoralis major muscle. I dissected that up and retracted it medially. I then found the lateral border of the pectoralis minor muscle dissected that up and retracted it medially. I then took the dissection up to  the axillary vein. There was one large venous tributary of the axillary vein which I controlled with multiple metal clips and divided. Then carried dissection down to identify the thoracodorsal neurovascular bundle which was preserved. I pulled all of the level I and level II x-ray contents out with sharp and blunt dissection. A few small veins and  sensory nerves had to be controlled with metal clips and divided. I did not specifically dissected out the long thoracic nerve, but I felt that I stayed well away from that.  The axillary contents were sent as a separate specimen fresh to the lab. Hemostasis was excellent and it had been achieved with electrocautery and metal clips. I placed one small piece of Surgicel gauze up along the axillary vein but actually hemostasis was excellent. A 19 [place] Blake drain was placed up into the axillary space and brought out through a separate stab incision inferolaterally. This was sutured to the skin with a nylon suture and connected to a suction bulb. The subcutaneous tissues closed with 3-0 Vicryl sutures and the skin closed with a running subcuticular suture of 4-0 Monocryl and Dermabond. Breast binder was placed and the patient went to recovery in stable condition. EBL 30 cc. Counts  correct. Complications none.Edsel Petrin. Dalbert Batman, M.D., FACS General and Minimally Invasive Surgery Breast and Colorectal Surgery  09/29/2013 11:36 AM

## 2013-09-29 NOTE — Progress Notes (Signed)
Pt has an allergy to PCN and Ancef was ordered.Called Dr.Ingram to see if he wanted to continue with Ancef and the answer was Yes since her reaction is just a rash

## 2013-09-30 MED ORDER — HYDROCODONE-ACETAMINOPHEN 5-325 MG PO TABS: 1.0000 | ORAL_TABLET | ORAL | Status: DC | PRN

## 2013-09-30 NOTE — Discharge Summary (Addendum)
Patient ID: Tabitha Ramirez [id number] 67 y.o. 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 Initial clinical stage T2, N1 Status post neoadjuvant chemotherapy Hypertension Chemotherapy-induced anemia requiring transfusion History TAH and BSO  Operations: Procedure(s): RIGHT BREAST NEEDLE LOCALIZED LUMPECTOMY AND AXILLARY LYMPH NODE DISSECTION REMOVAL PORT-A-CATH  Admission Condition: good  Discharged Condition: good  Indication for Admission: Tabitha Ramirez is a 68 y.o. female.  She was recently  treated for a locally advanced, at least 3.5 cm triple negative invasive mammary carcinoma of the right breast centrally in the neoadjuvant setting.  On 03/10/2013 she underwent injection of blue dye, right axillary sentinel node biopsy and Port-A-Cath insertion. One out of 2 lymph nodes was positive for metastatic carcinoma. She has been receiving chemotherapy. She developed a profound anemia and had a syncopal episode and was hospitalized and transfused. She had upper endoscopy, colonoscopy, and CT angiogram of the chest which did not show any source of bleeding and no pulmonary embolus.  End of treatment MRI was performed on 08/27/2013. This shows the tumor has now decreased to 1.5 cm. No adenopathy. Left breast normal.. This shows that the right breast mass has decreased from 3.5 cm to 1.5 cm. On exam, however, the central mass behind the areola feels a little bit larger. The skin is not invaded, however.  The patient is still hoping for a lumpectomy. I drew pictures of what that might look like, telling her that she would need a central lumpectomy with removal of the nipple and areola.  She is reasonably stable now. She does have  a little bit of neuropathy. She understands that if she has multiple positive margins she will need a mastectomy.  Dr. Jana Hakim states we can remove the Port-A-Cath. Her case was presented in tumor burden. The consensus was that we need to proceed with a right axillary lymph node dissection since there is no good data currently on radiating the axilla for axillary control in this setting.   Hospital Course: On the day of admission the patient underwent wire localization to estimate the posterior extent of tumor, general anesthesia, removal of Port-A-Cath, right breast central lumpectomy with tissue transfer reconstruction, right axillary lymph node dissection the surgery was uneventful. She was observed overnight for pain control and bleeding. She did well. On the morning following surgery all of her wounds looked good without any signs of hematoma or bleeding or skin necrosis. The drainage from the right axilla was serosanguineous. Pain control was good. We felt that it would be reasonable to discharge her home with the drain in place. She has a Tramadol at home and she was also given a prescription for hydrocodone, if needed. She will continue all of her usual medications. Diet &  activities were discussed, emphasis placed on hydration and ambulation. She was educated regarding drain care and record keeping.Marland Kitchen She will see me in the office in 6 days to check the drain. She knows that the pathology report will be called to her in 24-48 hours.  Consults: None  Significant Diagnostic Studies: Surgical pathology, pending  Treatments: surgery: Right partial mastectomy with needle localization, removal of Port-A-Cath, right axillary lymph node dissection.  Disposition: Home  Patient Instructions:    Medication List         FIRST-MOUTHWASH BLM MT  Use as directed 15 mLs in the mouth or throat daily.     gabapentin 300 MG capsule  Commonly known as:  NEURONTIN  Take 1 capsule (300 mg total) by mouth  at bedtime.     HYDROcodone-acetaminophen 5-325 MG per tablet  Commonly known as:  NORCO/VICODIN  Take 1-2 tablets by mouth every 4 (four) hours as needed.     LORazepam 0.5 MG tablet  Commonly known as:  ATIVAN  Take 1 tablet (0.5 mg total) by mouth at bedtime as needed for anxiety.     multivitamin with minerals Tabs tablet  Take 1 tablet by mouth daily. LIIQUID - Takes 1 cap full daily     ondansetron 8 MG tablet  Commonly known as:  ZOFRAN  Take 8 mg by mouth every 8 (eight) hours as needed for nausea.     pantoprazole 40 MG tablet  Commonly known as:  PROTONIX  Take 1 tablet (40 mg total) by mouth daily at 12 noon.     prochlorperazine 10 MG tablet  Commonly known as:  COMPAZINE  Take 10 mg by mouth every 6 (six) hours as needed for nausea or vomiting.     traMADol 50 MG tablet  Commonly known as:  ULTRAM  Take 1 tablet (50 mg total) by mouth every 6 (six) hours as needed for pain.     triamterene-hydrochlorothiazide 37.5-25 MG per tablet  Commonly known as:  MAXZIDE-25  Take 0.5 tablets by mouth daily.     TYLENOL EXTRA STRENGTH PO  Take 2 tablets by mouth daily as needed (for pain).        Activity: activity as tolerated Diet: low fat, low cholesterol diet Wound Care: as directed. Keep written record of drainage and break the office with her. Keep wound clean and dry. Sponge bathe.  Follow-up:  With Dr. Dalbert Batman in 1 week.  Signed: Edsel Petrin. Dalbert Batman, M.D., FACS General and minimally invasive surgery Breast and Colorectal Surgery  09/30/2013, 7:20 AM

## 2013-09-30 NOTE — Discharge Instructions (Signed)
What to eat:  For your first meals, you should eat lightly; only small meals initially.  If you do not have nausea, you may eat larger meals.  Avoid spicy, greasy and heavy food.    General Anesthesia, Adult, Care After  Refer to this sheet in the next few weeks. These instructions provide you with information on caring for yourself after your procedure. Your health care provider may also give you more specific instructions. Your treatment has been planned according to current medical practices, but problems sometimes occur. Call your health care provider if you have any problems or questions after your procedure.  WHAT TO EXPECT AFTER THE PROCEDURE  After the procedure, it is typical to experience:  Sleepiness.  Nausea and vomiting. HOME CARE INSTRUCTIONS  For the first 24 hours after general anesthesia:  Have a responsible person with you.  Do not drive a car. If you are alone, do not take public transportation.  Do not drink alcohol.  Do not take medicine that has not been prescribed by your health care provider.  Do not sign important papers or make important decisions.  You may resume a normal diet and activities as directed by your health care provider.  Change bandages (dressings) as directed.  If you have questions or problems that seem related to general anesthesia, call the hospital and ask for the anesthetist or anesthesiologist on call. SEEK MEDICAL CARE IF:  You have nausea and vomiting that continue the day after anesthesia.  You develop a rash. SEEK IMMEDIATE MEDICAL CARE IF:  You have difficulty breathing.  You have chest pain.  You have any allergic problems. Document Released: 12/18/2000 Document Revised: 05/14/2013 Document Reviewed: 03/27/2013  [facility] Patient Information 2014 [place], [place].      (see Dr. Darrel Hoover instructions above)

## 2013-09-30 NOTE — Progress Notes (Signed)
Discharge patient. Home discharge instruction given. JP and wound crae discuss with daughter., no question verbalized.

## 2013-10-01 ENCOUNTER — Encounter (HOSPITAL_COMMUNITY): Payer: Self-pay | Admitting: General Surgery

## 2013-10-02 NOTE — Progress Notes (Signed)
Quick Note:  Inform patient of Pathology report,.Good News. Tumor removed with negative margins, nodes negative. No need for further surgery.  hmi ______

## 2013-10-03 ENCOUNTER — Telehealth (INDEPENDENT_AMBULATORY_CARE_PROVIDER_SITE_OTHER): Payer: Self-pay

## 2013-10-03 NOTE — Telephone Encounter (Signed)
Pt made aware of pathology results.  No further surgery is needed.  Reminded of her appt with Dr. Dalbert Batman on 10/06/13.

## 2013-10-06 ENCOUNTER — Encounter (INDEPENDENT_AMBULATORY_CARE_PROVIDER_SITE_OTHER): Payer: Self-pay | Admitting: General Surgery

## 2013-10-06 ENCOUNTER — Ambulatory Visit (INDEPENDENT_AMBULATORY_CARE_PROVIDER_SITE_OTHER): Payer: Medicare Other | Admitting: General Surgery

## 2013-10-06 VITALS — BP 120/82 | HR 112 | Temp 98.3°F | Resp 15 | Ht 64.0 in | Wt 142.6 lb

## 2013-10-06 DIAGNOSIS — C50111 Malignant neoplasm of central portion of right female breast: Secondary | ICD-10-CM

## 2013-10-06 DIAGNOSIS — C50119 Malignant neoplasm of central portion of unspecified female breast: Secondary | ICD-10-CM

## 2013-10-06 NOTE — Patient Instructions (Signed)
Your right breast lumpectomy wound and right axillary wounds are healing nicely. No sign of infection.  We left the drain in place for another week because it's draining too much.  Return to see Dr. Dalbert Batman in one week hopefully we can remove the drain then.  Keep your appointment with Dr. Jana Hakim in early February.  Be sure to make an appointment to see Dr. Pablo Ledger in early February as well.  Drink lots of fluids, eat loss of fruits and vegetables, and take a walk every day.

## 2013-10-06 NOTE — Progress Notes (Signed)
Patient ID: Tabitha Ramirez, female   DOB: 10-16-1945, 68 y.o.   MRN: [id number] History: This patient underwent neoadjuvant chemotherapy for a 3.5 cm triple negative breast cancer of the right breast centrally. She had a pretreatment since although biopsy and Port-A-Cath insertion revealing one out of 2 lymph nodes to be positive. MRI demonstrated partial response. Physical exam revealed mild response, and continued involvement of the subareolar area. On 09/29/2013 she underwent removal of Port-A-Cath, right central lumpectomy with needle localization, tissue transfer reconstruction, and right axillary lymph node dissection, complete. Final pathology report shows scattered residual invasive nests of ductal carcinoma spanning 1.1 cm, lobular neoplasia, negative margins. 11 lymph nodes were analyzed and they are all negative. This was still a triple negative breast cancer with Ki-67 84%. Final pathologic stage ypT1c, ypN0. She is draining approximately 20 cc per day from her right axillary drain. She has minimal pain. She is sleeping okay at night.  Exam: Patient looks well. No distress. Daughter is with her. Right lumpectomy incision is healing nicely. Good breast mound with good contour. No hematoma. Right axillary wound healing uneventfully. Drainage serosanguineous. She is moving her shoulder around fairly well   Assessment: Triple negative breast cancer, right breast, central, subareolar.  YpT1c, ypNo. Status post pre treatment sentinel node biopsy (+) And Port-A-Cath Status post neoadjuvant chemotherapy with excellent partial response Status post recent Port-A-Cath removal, central lumpectomy and axillary lymph node dissection Neuropathy, on gabapentin   Plan: Leave drain for one more week, return to my office in one week for drain removal She will keep her appointments with Dr. Jana Hakim and Dr. Pablo Ledger in early February. She knows that she will need radiation therapy Diet and activities  emphasized.   Edsel Petrin. Dalbert Batman, M.D., [facility] General and Minimally invasive Surgery Breast and Colorectal Surgery Office:   [phone] Pager:   [phone]

## 2013-10-09 ENCOUNTER — Encounter (INDEPENDENT_AMBULATORY_CARE_PROVIDER_SITE_OTHER): Payer: Medicare Other | Admitting: General Surgery

## 2013-10-10 ENCOUNTER — Other Ambulatory Visit: Payer: Self-pay | Admitting: *Deleted

## 2013-10-10 MED ORDER — LORAZEPAM 0.5 MG PO TABS: 0.5000 mg | ORAL_TABLET | Freq: Every evening | ORAL | Status: DC | PRN

## 2013-10-13 ENCOUNTER — Ambulatory Visit (INDEPENDENT_AMBULATORY_CARE_PROVIDER_SITE_OTHER): Payer: Medicare Other

## 2013-10-13 VITALS — Temp 98.6°F

## 2013-10-13 DIAGNOSIS — Z9889 Other specified postprocedural states: Secondary | ICD-10-CM

## 2013-10-13 MED ORDER — HYDROCODONE-ACETAMINOPHEN 5-325 MG PO TABS: 1.0000 | ORAL_TABLET | ORAL | Status: DC | PRN

## 2013-10-13 NOTE — Progress Notes (Unsigned)
Patient in for nurse only Drain removal if less han 15cc x 3 days ; Drain 2cc today , 5 cc x 4 days , afebrile, no redness,drainage, odor noted incision area glue  intact  ; Cleansed area with chorea  Prep, removed drain  suture, removed drain , applied 4x4 to drain area , secured with tape, patient tolerated well. Advised to call if temp 100.3 or greater, swelling in the area, drainage from incision site or odor or redness. Patient verbalized understanding, appt with DR. Ingram 11-20-13@1030am  Norco 5/325mg  1-2 po q4hrs prn # 30  refilled per Dr. Dalbert Batman

## 2013-10-20 NOTE — Progress Notes (Signed)
Location of Breast Cancer: Right Breast  Histology per Pathology Report:  01/31/13 Diagnosis 1. Breast, right, needle core biopsy, mass, 12 o'clock - INVASIVE MAMMARY CARCINOMA, SEE COMMENT. 2. Lymph node, needle/core biopsy, right axilla - ONE LYMPH NODE, NEGATIVE FOR TUMOR (0/1), SEE COMMENT   Receptor Status: ER(0%), PR (0%), Her2-neu (No amplification), Ki-67(84%)  Tabitha Ramirez had not had a mammogram for 3-4 years prior to diagnosis. She felt a lump in her right breast superiorly for about 3 months. She went for mammograms which show a 4.3 cm mass in the upper outer quadrant of the right breast. The breasts are dense. There are diffuse calcifications in both breasts. No mass in the left breast. Ultrasound shows a 2.5 cm right breast mass at 12:00 and prominent right axillary lymph nodes measuring as large as 2.5 cm one dimension   Past/Anticipated interventions by surgeon, if any: right Breast Lumpectomy, 09/29/2013   Past/Anticipated interventions by medical oncology, if any: Chemotherapy:  Neoadjuvant doxorubicin and cyclophosphamide to be given in dose dense fashion with Neulasta support starting 03/17/2013, to be followed by weekly carboplatin and paclitaxel x12.  Finished before Christmas, 2014.   Lymphedema issues, if any: no   Pain issues, if any:  Has peripheral neuropathy in her hands and feet.  She is taking gabapentin 300 mg at night.  SAFETY ISSUES:  Prior radiation? no  Pacemaker/ICD?no  Possible current pregnancy? no  Is the patient on methotrexate?no  Current Complaints / other details:  Menarche age 89, first live birth age 46. The patient had her hysterectomy in the 1970s. She has been on estrogen replacement for she thinks 18 years.  Here with her daughter today.  Has trouble sleeping at night and is wondering if she can take a sleep aid and the gapapentin.  She has lost 4 lbs since 1//12.  Her daughter reports that she is not eating very much.  She has seen Tabitha Ramirez.

## 2013-10-23 ENCOUNTER — Ambulatory Visit
Admission: RE | Admit: 2013-10-23 | Discharge: 2013-10-23 | Disposition: A | Discharge status: Home / Self Care | Payer: Medicare Other | Source: Ambulatory Visit | Attending: Radiation Oncology | Admitting: Radiation Oncology

## 2013-10-23 VITALS — BP 108/67 | HR 101 | Temp 98.4°F | Ht 64.0 in | Wt 138.4 lb

## 2013-10-23 DIAGNOSIS — K219 Gastro-esophageal reflux disease without esophagitis: Secondary | ICD-10-CM | POA: Insufficient documentation

## 2013-10-23 DIAGNOSIS — G43909 Migraine, unspecified, not intractable, without status migrainosus: Secondary | ICD-10-CM | POA: Insufficient documentation

## 2013-10-23 DIAGNOSIS — C50119 Malignant neoplasm of central portion of unspecified female breast: Secondary | ICD-10-CM

## 2013-10-23 DIAGNOSIS — I10 Essential (primary) hypertension: Secondary | ICD-10-CM | POA: Insufficient documentation

## 2013-10-23 DIAGNOSIS — R262 Difficulty in walking, not elsewhere classified: Secondary | ICD-10-CM | POA: Insufficient documentation

## 2013-10-23 DIAGNOSIS — C779 Secondary and unspecified malignant neoplasm of lymph node, unspecified: Secondary | ICD-10-CM | POA: Insufficient documentation

## 2013-10-23 DIAGNOSIS — Z9079 Acquired absence of other genital organ(s): Secondary | ICD-10-CM | POA: Insufficient documentation

## 2013-10-23 DIAGNOSIS — Z87891 Personal history of nicotine dependence: Secondary | ICD-10-CM | POA: Insufficient documentation

## 2013-10-23 DIAGNOSIS — M26609 Unspecified temporomandibular joint disorder, unspecified side: Secondary | ICD-10-CM | POA: Insufficient documentation

## 2013-10-23 DIAGNOSIS — D649 Anemia, unspecified: Secondary | ICD-10-CM | POA: Insufficient documentation

## 2013-10-23 DIAGNOSIS — R011 Cardiac murmur, unspecified: Secondary | ICD-10-CM | POA: Insufficient documentation

## 2013-10-23 DIAGNOSIS — Z90710 Acquired absence of both cervix and uterus: Secondary | ICD-10-CM | POA: Insufficient documentation

## 2013-10-23 DIAGNOSIS — Z79899 Other long term (current) drug therapy: Secondary | ICD-10-CM | POA: Insufficient documentation

## 2013-10-23 NOTE — Progress Notes (Signed)
Please see the Nurse Progress Note in the MD Initial Consult Encounter for this patient. 

## 2013-10-24 NOTE — Progress Notes (Signed)
Radiation Oncology         [phone]) [phone] ________________________________  Initial outpatient Consultation - Date: 10/23/2013   Name: Tabitha Ramirez MRN: [id number]   DOB: 04-28-1946  REFERRING PHYSICIAN: Magrinat, Virgie Dad, MD  DIAGNOSIS:  1. Cancer of central portion of female breast     STAGE: Cancer of central portion of female breast   Primary site: Breast (Right)   Staging method: AJCC 7th Edition   Clinical: Stage IIA (T2, N0, cM0)   Summary: Stage IIA (T2, N0, cM0)   Clinical comments: Staged at breast conference 5.21.14  HISTORY OF PRESENT ILLNESS::[name] is a 68 y.o. female  who palpated a right breast mass. She underwent neoadjuvant chemotherapy. She did have an upfront sentinel lymph node biopsy which showed one lymph node positive for carcinoma. Her lumpectomy was performed on 09/29/2013 which showed scattered residual nests of invasive ductal carcinoma spanning 1.1 cm with atypical lobular hyperplasia and treatment effect. Margins were negative. 11 x 9 lymph nodes were seen. The tumor continued to be triple negative with a Ki-67 of 84%. 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 This is stable. She is recovered well from her surgery. She has no further drainage. She also had her Port-A-Cath removed and is happy about that. She has been cleared from surgery to start radiation.Marland Kitchen  PREVIOUS RADIATION THERAPY: No  PAST MEDICAL HISTORY:  has a past medical history of Heart murmur; Hypertension; Anemia; Allergy; Migraines; History of blood transfusion; GERD (gastroesophageal reflux disease); Breast cancer; Arthritis; and TMJ (temporomandibular joint syndrome).    PAST SURGICAL HISTORY: Past Surgical History  Procedure Laterality Date  . Portacath placement N/A 03/10/2013    Procedure: INSERTION PORT-A-CATH WITH FLUOROSCOPY AND ULTRASOUND;   Surgeon: Adin Hector, MD;  Location: [place];  Service: General;  Laterality: N/A;  . Axillary lymph node biopsy Right 03/10/2013    Procedure: AXILLARY LYMPH NODE BIOPSY;  Surgeon: Adin Hector, MD;  Location: [place];  Service: General;  Laterality: Right;  sentinel node with blue dye  . Esophagogastroduodenoscopy N/A 04/28/2013    Procedure: ESOPHAGOGASTRODUODENOSCOPY (EGD);  Surgeon: Wonda Horner, MD;  Location: [facility];  Service: Endoscopy;  Laterality: N/A;  . Colonoscopy N/A 04/30/2013    Procedure: COLONOSCOPY;  Surgeon: Wonda Horner, MD;  Location: [facility];  Service: Endoscopy;  Laterality: N/A;  . Port-a-cath removal  09/29/2013  . Breast lumpectomy Right 09/29/2013    needle localization w/axillary LND/notes 09/29/2013  . Total abdominal hysterectomy      With bilateral salpingo-oophorectomy  . Tonsillectomy    . Mastectomy Right 09/29/2013    "partial"  . Breast lumpectomy with needle localization and axillary lymph node dissection Right 09/29/2013    Procedure: RIGHT BREAST NEEDLE LOCALIZED LUMPECTOMY AND AXILLARY LYMPH NODE DISSECTION;  Surgeon: Adin Hector, MD;  Location: [place];  Service: General;  Laterality: Right;  . Port-a-cath removal Left 09/29/2013    Procedure: REMOVAL PORT-A-CATH;  Surgeon: Adin Hector, MD;  Location: [place];  Service: General;  Laterality: Left;    FAMILY HISTORY:  Family History  Problem Relation Age of Onset  . Cancer Mother 51    Unknown type of cancer  . Congestive Heart Failure Mother   . Colon cancer Brother   . Diabetes Brother 32  . Heart disease Brother     AMI  05/22/2012  . Congestive Heart Failure Brother   . Cancer Maternal Grandmother 70    Unknown type of cancer    SOCIAL HISTORY:  History  Substance Use Topics  . Smoking status: Former Smoker -- 1 years    Types: Cigarettes  . Smokeless tobacco: Never Used     Comment: smoked in college 1 yr   . Alcohol Use: No     Comment: Wine occasionally    ALLERGIES:  Penicillins  MEDICATIONS:  Current Outpatient Prescriptions  Medication Sig Dispense Refill  . Acetaminophen (TYLENOL EXTRA STRENGTH PO) Take 2 tablets by mouth daily as needed (for pain).       . DPH-Lido-AlHydr-MgHydr-Simeth (FIRST-MOUTHWASH BLM MT) Use as directed 15 mLs in the mouth or throat daily.      Marland Kitchen gabapentin (NEURONTIN) 300 MG capsule Take 1 capsule (300 mg total) by mouth at bedtime.  30 capsule  2  . HYDROcodone-acetaminophen (NORCO/VICODIN) 5-325 MG per tablet Take 1-2 tablets by mouth every 4 (four) hours as needed.  30 tablet  0  . LORazepam (ATIVAN) 0.5 MG tablet Take 1 tablet (0.5 mg total) by mouth at bedtime as needed for anxiety.  20 tablet  0  . Multiple Vitamin (MULTIVITAMIN WITH MINERALS) TABS Take 1 tablet by mouth daily. LIIQUID - Takes 1 cap full daily      . ondansetron (ZOFRAN) 8 MG tablet Take 8 mg by mouth every 8 (eight) hours as needed for nausea.       . pantoprazole (PROTONIX) 40 MG tablet Take 1 tablet (40 mg total) by mouth daily at 12 noon.  30 tablet  2  . prochlorperazine (COMPAZINE) 10 MG tablet Take 10 mg by mouth every 6 (six) hours as needed for nausea or vomiting.      . traMADol (ULTRAM) 50 MG tablet Take 1 tablet (50 mg total) by mouth every 6 (six) hours as needed for pain.  60 tablet  0  . triamterene-hydrochlorothiazide (MAXZIDE-25) 37.5-25 MG per tablet Take 0.5 tablets by mouth daily.       No current facility-administered medications for this encounter.    REVIEW OF SYSTEMS:  A 15 point review of systems is documented in the electronic medical record. This was obtained by the nursing staff. However, I reviewed this with the patient to discuss relevant findings and make appropriate changes.  Pertinent items are noted in HPI.  PHYSICAL EXAM:  Filed Vitals:   10/23/13 0847  BP: 108/67  Pulse: 101  Temp: 98.4 F (36.9 C)  .138 lb 6.4 oz (62.778 kg). Status post central lumpectomy. She is well-healed. She still has some surgical glue in  place but otherwise looks good. She is alert minus x3. She has difficulty ambulating. She needs assistance.  LABORATORY DATA:  Lab Results  Component Value Date   WBC 7.1 09/16/2013   HGB 9.7* 09/16/2013   HCT 27.9* 09/16/2013   MCV 95.5 09/16/2013   PLT 286 09/16/2013   Lab Results  Component Value Date   NA 142 09/16/2013   K 3.4* 09/16/2013   CL 101 09/16/2013   CO2 27 09/16/2013   Lab Results  Component Value Date   ALT 21 09/16/2013   AST 20 09/16/2013   ALKPHOS 89 09/16/2013   BILITOT 0.3 09/16/2013     RADIOGRAPHY: Mm Rt Plc Breast Loc Dev   1st Lesion  Inc Mammo Guide  09/29/2013   CLINICAL DATA:  Known right-sided breast cancers following neoadjuvant chemotherapy. Right lumpectomy.  EXAM:  NEEDLE LOCALIZATION OF THE right BREAST WITH MAMMO GUIDANCE  COMPARISON:  Previous exams.  FINDINGS: Patient presents for needle localization prior to right lumpectomy. I met with the patient and we discussed the procedure of needle localization including benefits and alternatives. We discussed the high likelihood of a successful procedure. We discussed the risks of the procedure, including infection, bleeding, tissue injury, and further surgery. Informed, written consent was given. The usual time-out protocol was performed immediately prior to the procedure.  Using mammographic guidance, sterile technique, 2% lidocaine and a 7 cm modified Kopans needle, the mass and clip are localized using lateral approach. The films were marked for Dr. Dalbert Batman.  Specimen radiograph was performed at Day Surgery and confirms the mass and clip are present in the tissue sample. The specimen was marked for pathology.  IMPRESSION: Needle localization right breast. No apparent complications.   Electronically Signed   By: Duke Salvia M.D.   On: 09/29/2013 12:03      IMPRESSION: ypT1ypN0 invasive ductal carcinoma of the right breast status post neoadjuvant chemotherapy lumpectomy and axillary node  dissection  PLAN: I spoke to Ms. Kercheval and her daughter today. We discussed the role of radiation in patients who undergo neoadjuvant chemotherapy. We discussed the process of simulation the placement tattoos. We discussed 6 weeks of treatment as an outpatient. We discussed treatment of the breast and the supraclavicular fossa. We discussed skin redness and darkening as possible side effects during treatment. We discussed the possibility of permanent skin darkening. We discussed the low likelihood of symptomatic lung or rib damage. We discussed the low likelihood of secondary malignancies. She has signed informed consent and agree to proceed forward. I will also make sure she is referred to our after breast cancer class and possibly physical therapy for her help with her neuropathy symptoms. I spent 40 minutes  face to face with the patient and more than 50% of that time was spent in counseling and/or coordination of care.   ------------------------------------------------  Thea Silversmith, MD

## 2013-10-28 ENCOUNTER — Ambulatory Visit
Admission: RE | Admit: 2013-10-28 | Discharge: 2013-10-28 | Disposition: A | Discharge status: Home / Self Care | Payer: Medicare Other | Source: Ambulatory Visit | Attending: Radiation Oncology | Admitting: Radiation Oncology

## 2013-10-28 ENCOUNTER — Telehealth: Payer: Self-pay | Admitting: Dietician

## 2013-10-28 DIAGNOSIS — Z510 Encounter for antineoplastic radiation therapy: Secondary | ICD-10-CM | POA: Insufficient documentation

## 2013-10-28 DIAGNOSIS — C50119 Malignant neoplasm of central portion of unspecified female breast: Secondary | ICD-10-CM

## 2013-10-28 NOTE — Telephone Encounter (Signed)
Brief Outpatient Oncology Nutrition Note  Patient has been identified to be at risk on malnutrition screen.  Wt Readings from Last 10 Encounters:  10/23/13 138 lb 6.4 oz (62.778 kg)  10/06/13 142 lb 9.6 oz (64.683 kg)  09/16/13 146 lb 9.7 oz (66.5 kg)  09/04/13 149 lb (67.586 kg)  09/02/13 148 lb 4.8 oz (67.268 kg)  08/25/13 146 lb 4.8 oz (66.361 kg)  08/18/13 146 lb 1.6 oz (66.271 kg)  08/11/13 145 lb (65.772 kg)  08/04/13 149 lb 6.4 oz (67.767 kg)  07/28/13 148 lb (67.132 kg)   Dx:  Breast cancer receiving chemotherapy  Called patient due to weight loss.  Patient was last seen by the [place] [facility] 07/14/2013.  Patient with a 10 lb weight loss since that time.    Patient was unavailable.  Contact information for the [place] provided and encouraged patient to call for questions or for another appointment as needed.  Antonieta Iba, RD, LDN

## 2013-10-29 NOTE — Progress Notes (Addendum)
Name: TENESSA MARSEE   MRN: [id number]  Date:  10/29/2013  DOB: [birth year]  Status:outpatient    DIAGNOSIS: Breast cancer.  CONSENT VERIFIED: yes   SET UP: Patient is setup supine   IMMOBILIZATION:  The following immobilization was used:Vacloc   NARRATIVE: Ms. [place] was brought to the [facility].  Identity was confirmed.  All relevant records and images related to the planned course of therapy were reviewed.  Then, the patient was positioned in a stable reproducible clinical set-up for radiation therapy.  Wires were placed to delineate the clinical extent of breast tissue. A wire was placed on the scar as well.  CT images were obtained.  An isocenter was placed. Skin markings were placed.  The CT images were loaded into the planning software where the target and avoidance structures were contoured.  The radiation prescription was entered and confirmed. The patient was discharged in stable condition and tolerated simulation well.    TREATMENT PLANNING NOTE:  Treatment planning then occurred. I have requested : MLC's, isodose plan, basic dose calculation  I personally designed and supervised the construction of 7 medically necessary complex treatment devices for the protection of critical normal structures including the lungs and contralateral breast as well as the immobilization device which is necessary for set up certainty.

## 2013-10-31 ENCOUNTER — Other Ambulatory Visit: Payer: Self-pay | Admitting: *Deleted

## 2013-10-31 DIAGNOSIS — Z9889 Other specified postprocedural states: Secondary | ICD-10-CM

## 2013-10-31 MED ORDER — LORAZEPAM 0.5 MG PO TABS: 0.5000 mg | ORAL_TABLET | Freq: Every evening | ORAL | Status: DC | PRN

## 2013-10-31 MED ORDER — HYDROCODONE-ACETAMINOPHEN 5-325 MG PO TABS: 1.0000 | ORAL_TABLET | ORAL | Status: DC | PRN

## 2013-11-04 ENCOUNTER — Ambulatory Visit
Admission: RE | Admit: 2013-11-04 | Discharge: 2013-11-04 | Disposition: A | Discharge status: Home / Self Care | Payer: Medicare Other | Source: Ambulatory Visit | Attending: Radiation Oncology | Admitting: Radiation Oncology

## 2013-11-04 ENCOUNTER — Encounter: Payer: Self-pay | Admitting: Radiation Oncology

## 2013-11-05 ENCOUNTER — Ambulatory Visit
Admission: RE | Admit: 2013-11-05 | Discharge: 2013-11-05 | Disposition: A | Discharge status: Home / Self Care | Payer: Medicare Other | Source: Ambulatory Visit | Attending: Radiation Oncology | Admitting: Radiation Oncology

## 2013-11-05 ENCOUNTER — Other Ambulatory Visit (HOSPITAL_BASED_OUTPATIENT_CLINIC_OR_DEPARTMENT_OTHER): Payer: Medicare Other

## 2013-11-05 ENCOUNTER — Ambulatory Visit (HOSPITAL_BASED_OUTPATIENT_CLINIC_OR_DEPARTMENT_OTHER): Payer: Medicare Other | Admitting: Oncology

## 2013-11-05 VITALS — BP 110/74 | HR 91 | Temp 98.1°F | Resp 20 | Ht 64.0 in | Wt 137.2 lb

## 2013-11-05 DIAGNOSIS — D649 Anemia, unspecified: Secondary | ICD-10-CM

## 2013-11-05 DIAGNOSIS — E876 Hypokalemia: Secondary | ICD-10-CM

## 2013-11-05 DIAGNOSIS — C50119 Malignant neoplasm of central portion of unspecified female breast: Secondary | ICD-10-CM

## 2013-11-05 DIAGNOSIS — C50111 Malignant neoplasm of central portion of right female breast: Secondary | ICD-10-CM

## 2013-11-05 LAB — CBC WITH DIFFERENTIAL/PLATELET
BASO%: 0.5 % (ref 0.0–2.0)
Basophils Absolute: 0 10*3/uL (ref 0.0–0.1)
EOS ABS: 0.1 10*3/uL (ref 0.0–0.5)
EOS%: 1.7 % (ref 0.0–7.0)
HCT: 31.2 % — ABNORMAL LOW (ref 34.8–46.6)
HGB: 10.2 g/dL — ABNORMAL LOW (ref 11.6–15.9)
LYMPH%: 40.9 % (ref 14.0–49.7)
MCH: 32.3 pg (ref 25.1–34.0)
MCHC: 32.7 g/dL (ref 31.5–36.0)
MCV: 98.7 fL (ref 79.5–101.0)
MONO#: 0.5 10*3/uL (ref 0.1–0.9)
MONO%: 6.7 % (ref 0.0–14.0)
NEUT%: 50.2 % (ref 38.4–76.8)
NEUTROS ABS: 3.8 10*3/uL (ref 1.5–6.5)
Platelets: 272 10*3/uL (ref 145–400)
RBC: 3.16 10*6/uL — ABNORMAL LOW (ref 3.70–5.45)
RDW: 18.4 % — AB (ref 11.2–14.5)
WBC: 7.6 10*3/uL (ref 3.9–10.3)
lymph#: 3.1 10*3/uL (ref 0.9–3.3)

## 2013-11-05 LAB — COMPREHENSIVE METABOLIC PANEL (CC13)
ALK PHOS: 77 U/L (ref 40–150)
ALT: 29 U/L (ref 0–55)
AST: 35 U/L — AB (ref 5–34)
Albumin: 3.8 g/dL (ref 3.5–5.0)
Anion Gap: 13 mEq/L — ABNORMAL HIGH (ref 3–11)
BUN: 15.2 mg/dL (ref 7.0–26.0)
CO2: 26 mEq/L (ref 22–29)
Calcium: 10.1 mg/dL (ref 8.4–10.4)
Chloride: 105 mEq/L (ref 98–109)
Creatinine: 1.2 mg/dL — ABNORMAL HIGH (ref 0.6–1.1)
Glucose: 139 mg/dl (ref 70–140)
POTASSIUM: 3.5 meq/L (ref 3.5–5.1)
SODIUM: 144 meq/L (ref 136–145)
TOTAL PROTEIN: 7.5 g/dL (ref 6.4–8.3)
Total Bilirubin: 0.5 mg/dL (ref 0.20–1.20)

## 2013-11-05 MED ORDER — RADIAPLEXRX EX GEL
Freq: Once | CUTANEOUS | Status: AC
  Administered 2013-11-05: 19:00:00 via TOPICAL

## 2013-11-05 NOTE — Progress Notes (Signed)
Post sim ed completed w/pt and daughter. Gave pt "Radiation and You" booklet w/all pertinent information marked and discussed, re: fatigue, skin irritation/care,  nutrition, pain. Gave pt Radiaplex w./instructions for proper use. Advised tom's of maine deodorant.  Pt verbalized understanding.

## 2013-11-06 ENCOUNTER — Ambulatory Visit
Admission: RE | Admit: 2013-11-06 | Discharge: 2013-11-06 | Disposition: A | Discharge status: Home / Self Care | Payer: Medicare Other | Source: Ambulatory Visit | Attending: Radiation Oncology | Admitting: Radiation Oncology

## 2013-11-06 NOTE — Progress Notes (Signed)
ID: [name] OB: 03-08-1946  MR#: [id number]  CSN#:[id number]  PCP: Clarene Duke, MD GYN:  Cheri Fowler, MD SU: Fanny Skates, MD OTHER MD: Thea Silversmith, MD  CHIEF COMPLAINT:  Right Breast Cancer/Neoadjuvant chemotherapy   HISTORY OF PRESENT ILLNESS: Khole tells me she felt a mass in her right breast about 3 months ago but initially ignored it. She eventually brought it to her physician's attention and was set up for bilateral diagnostic mammography at the [facility] Center 01/31/2013. This showed an area of distortion in the upper outer quadrant measuring approximately 4.3 cm. Diffuse calcifications in both breasts were stable. The mass was palpable at 12:00, 3 cm from the nipple, and by ultrasound it was irregular, hypoechoic, and measured 2.5 cm. In addition there was prominent right axillary adenopathy the largest lymph node measuring 2.5 cm.  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 Biopsy of the lymph node was negative. It did show some lymph node tissue.  Bilateral breast MRI 02/10/2013 showed a ring-enhancing necrotic-appearing 3.4 cm mass in the anterior third of the right breast. There was also a 2.0 cm level one right axillary lymph node that was prominent, but maintains central fat. The left breast the left axilla were benign and there was no internal mammary adenopathy noted.  The patient's subsequent history is as detailed below.  INTERVAL HISTORY: Zyara returns today accompanied by her daughter [name] for followup of her breast cancer. Since her last visit here she had her definitive surgery, namely a right lumpectomy and right axillary lymph node dissection on 09/29/2013. The pathology from that procedure ([id number]) showed an area of approximately 1.1 cm showing residual invasive nests and cells of ductal carcinoma, grade 2, but  mostly posttreatment effect. There was not sufficient tissue for HER-2 analysis. Repeat estrogen and progesterone receptors were again negative. All 11 lymph nodes were benign. Margins were ample  REVIEW OF SYSTEMS: Emmelyn has significant neuropathy, mostly numbness and tingling involving her legs and fingers. She did well with the surgery, with very little pain, no leading or fever. Aside from the numbness in her hands and feet she has some arthritis pain and some difficulty walking. She denies unusual headaches, visual changes, cough, phlegm production, or pleurisy. There've been no change in bowel or bladder habits. A detailed review of systems today was otherwise noncontributory.  PAST MEDICAL HISTORY: Past Medical History  Diagnosis Date  . Heart murmur   . Hypertension   . Anemia   . Allergy   . Migraines   . History of blood transfusion     "related to chemo/breast cancer" (09/29/2013)  . GERD (gastroesophageal reflux disease)   . Breast cancer   . Arthritis     "joints" (09/29/2013)  . TMJ (temporomandibular joint syndrome)     "left; just dx'd" (09/29/2013    PAST SURGICAL HISTORY: Past Surgical History  Procedure Laterality Date  . Portacath placement N/A 03/10/2013    Procedure: INSERTION PORT-A-CATH WITH FLUOROSCOPY AND ULTRASOUND;  Surgeon: Adin Hector, MD;  Location: [facility];  Service: General;  Laterality: N/A;  . Axillary lymph node biopsy Right 03/10/2013    Procedure: AXILLARY LYMPH NODE BIOPSY;  Surgeon: Adin Hector, MD;  Location: [facility];  Service: General;  Laterality: Right;  sentinel node with blue dye  . Esophagogastroduodenoscopy N/A 04/28/2013    Procedure: ESOPHAGOGASTRODUODENOSCOPY (EGD);  Surgeon: Wonda Horner, MD;  Location: [facility];  Service: Endoscopy;  Laterality: N/A;  . Colonoscopy N/A 04/30/2013    Procedure: COLONOSCOPY;  Surgeon: Wonda Horner, MD;  Location: [facility];  Service: Endoscopy;  Laterality: N/A;  . Port-a-cath removal  09/29/2013   . Breast lumpectomy Right 09/29/2013    needle localization w/axillary LND/notes 09/29/2013  . Total abdominal hysterectomy      With bilateral salpingo-oophorectomy  . Tonsillectomy    . Mastectomy Right 09/29/2013    "partial"  . Breast lumpectomy with needle localization and axillary lymph node dissection Right 09/29/2013    Procedure: RIGHT BREAST NEEDLE LOCALIZED LUMPECTOMY AND AXILLARY LYMPH NODE DISSECTION;  Surgeon: Adin Hector, MD;  Location: [facility];  Service: General;  Laterality: Right;  . Port-a-cath removal Left 09/29/2013    Procedure: REMOVAL PORT-A-CATH;  Surgeon: Adin Hector, MD;  Location: [facility];  Service: General;  Laterality: Left;    FAMILY HISTORY Family History  Problem Relation Age of Onset  . Cancer Mother 19    Unknown type of cancer  . Congestive Heart Failure Mother   . Colon cancer Brother   . Diabetes Brother 13  . Heart disease Brother     AMI 05/22/2012  . Congestive Heart Failure Brother   . Cancer Maternal Grandmother 42    Unknown type of cancer  the patient's father died in an automobile accident at the age of 63. The patient's mother lived to be 32. Meleni has 3 brothers, no sisters. One brother had colon cancer at the age of 30. The patient's mother and the patient's mothers mother (maternal grandmother) both were diagnosed with some kind of cancer at the age of 44 or thereabouts, but Jazmeen does not know what type of cancer this may have been.  GYNECOLOGIC HISTORY:  Menarche age 72, first live birth age 3. The patient had her hysterectomy in the 1970s. She has been on estrogen replacement for she thinks 18 years.  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 She is divorced, lives by herself, with no pets. Her first child was given up for adoption. Her second child, Reggy Eye, is a Theatre stage manager and is working towards a PhD at [employer]. Meleny has no grandchildren. She attends new [employer]    ADVANCED DIRECTIVES: In place. The patient has named her daughter [name] as her healthcare power of attorney. [name] can be reached at [phone]. The healthcare power of attorney documents were brought by the patient 03/13/2013 and are separately scanned.   HEALTH MAINTENANCE: (Updated 06/30/2013) History  Substance Use Topics  . Smoking status: Former Smoker -- 1 years    Types: Cigarettes  . Smokeless tobacco: Never Used     Comment: smoked in college 1 yr   . Alcohol Use: No     Comment: Wine occasionally     Colonoscopy: August 2012  PAP:  Bone density: Never  Lipid panel: Dr. Willis Modena    Allergies  Allergen Reactions  . Penicillins Rash     Objective: Middle-aged [place] American woman transported in a wheelchair today Filed Vitals:   11/05/13 0837  BP: 110/74  Pulse: 91  Temp: 98.1 F (36.7 C)  Resp: 20       Body mass index is 23.54 kg/(m^2).    ECOG FS: 2 Filed Weights   11/05/13 [phone]  Weight: 137 lb 3.2 oz (62.234 kg)   Sclerae unicteric, pupils equal and round Oropharynx clear and moist-- no thrush or other lesions No cervical or supraclavicular adenopathy Lungs no rales or rhonchi Heart regular rate and rhythm, no murmur appreciated Abd soft, nontender, positive bowel sounds MSK no focal spinal tenderness, no upper extremity lymphedema Neuro: nonfocal, well oriented, appropriate affect; subjective numbness over most of the hand bilaterally Breasts: The right breast is status post lumpectomy. There is no evidence of residual disease. The incision is healing well. The cosmetic result is good. The right axilla is benign. The left breast is unremarkable   LAB RESULTS:   Lab Results  Component Value Date   WBC 7.6 11/05/2013   NEUTROABS 3.8 11/05/2013   HGB 10.2* 11/05/2013   HCT 31.2* 11/05/2013   MCV 98.7 11/05/2013   PLT 272 11/05/2013       Chemistry      Component Value Date/Time   NA 144 11/05/2013 0826   NA 142 09/16/2013 1000   K 3.5 11/05/2013 0826   K 3.4* 09/16/2013 1000   CL 101 09/16/2013 1000   CL 100 03/13/2013 0918   CO2 26 11/05/2013 0826   CO2 27 09/16/2013 1000   BUN 15.2 11/05/2013 0826   BUN 13 09/16/2013 1000   CREATININE 1.2* 11/05/2013 0826   CREATININE 0.94 09/16/2013 1000   CREATININE 0.94 11/29/2011 1535      Component Value Date/Time   CALCIUM 10.1 11/05/2013 0826   CALCIUM 9.6 09/16/2013 1000   ALKPHOS 77 11/05/2013 0826   ALKPHOS 89 09/16/2013 1000   AST 35* 11/05/2013 0826   AST 20 09/16/2013 1000   ALT 29 11/05/2013 0826   ALT 21 09/16/2013 1000   BILITOT 0.50 11/05/2013 0826   BILITOT 0.3 09/16/2013 1000       STUDIES: No results found.  ASSESSMENT: 68 y.o. [place] woman   (1)  status post right central breast biopsy 01/31/2013 for a clinical T2 NX, stage II invasive ductal carcinoma, grade 3, triple negative, with an MIB-1 of 84%  (2) biopsy of an enlarged (2.5 cm) right axillary lymph node 01/31/2013 was negative, felt possibly discordant  (3) sentinel lymph node sampling 03/10/2013 showed one of two sentinel nodes to be positive; repeat prognostic now showing estrogen receptor 3% positive with weak staining and progesterone receptor 6% positive, with moderate staining. Repeat MIB-1 was 50%. HER-2/neu was again not amplified  (4) neoadjuvant doxorubicin and cyclophosphamide being given in dose dense fashion with Neulasta support starting 03/17/2013, followed by weekly carboplatin and paclitaxel x12, with final cycle 08/25/2013  (5) status post left lumpectomy and axillary lymph node dissection 09/29/2013 for a ypT1c ypN0 invasive ductal carcinoma, grade 2, estrogen and progesterone receptor again negative, with insufficient tissue for HER-2 retesting.  (6) radiation therapy to follow)  (7) chemotherapy-induced anemia  Status post blood transfusion, 2 units of packed red blood  cells, on 06/10/2013 and again on 12-14.  (8) chemotherapy-induced peripheral neuropathy, grade 2  PLAN: Francena had a good response to chemotherapy and I am hopeful this will translate into long-term disease-free survival for her. I am concerned about the neuropathy, which is at least grade 2. Hopefully this will improve. She is going to be on Neurontin at bedtime, but I am concerned about her taking it during the day because of some dullness issues. I suggested she start a B complex vitamin tablet daily. Hopefully these symptoms will improve with time.  She will complete her radiation  treatments in late March. She will return to see [place] in late April and at that point we will initiate long-term followup.  Yaelis knows to call for any problems that may develop before the next visit here.  Chauncey Cruel, MD   11/06/2013 4:43 PM

## 2013-11-07 ENCOUNTER — Ambulatory Visit
Admission: RE | Admit: 2013-11-07 | Discharge: 2013-11-07 | Disposition: A | Discharge status: Home / Self Care | Payer: Medicare Other | Source: Ambulatory Visit | Attending: Radiation Oncology | Admitting: Radiation Oncology

## 2013-11-10 ENCOUNTER — Other Ambulatory Visit: Payer: Self-pay | Admitting: *Deleted

## 2013-11-10 ENCOUNTER — Telehealth: Payer: Self-pay | Admitting: Oncology

## 2013-11-10 ENCOUNTER — Ambulatory Visit
Admission: RE | Admit: 2013-11-10 | Discharge: 2013-11-10 | Disposition: A | Discharge status: Home / Self Care | Payer: Medicare Other | Source: Ambulatory Visit | Attending: Radiation Oncology | Admitting: Radiation Oncology

## 2013-11-10 NOTE — Telephone Encounter (Signed)
Not imc pt

## 2013-11-10 NOTE — Telephone Encounter (Signed)
lvm for pt regarding to April appt....mailed pt appt sched and letter °

## 2013-11-11 ENCOUNTER — Ambulatory Visit
Admission: RE | Admit: 2013-11-11 | Discharge: 2013-11-11 | Disposition: A | Discharge status: Home / Self Care | Payer: Medicare Other | Source: Ambulatory Visit | Attending: Radiation Oncology | Admitting: Radiation Oncology

## 2013-11-11 DIAGNOSIS — C50119 Malignant neoplasm of central portion of unspecified female breast: Secondary | ICD-10-CM

## 2013-11-11 NOTE — Progress Notes (Signed)
MD saw patient in the treatment area, not sent to nursing for assessment 1:50 PM

## 2013-11-11 NOTE — Progress Notes (Signed)
Weekly Management Note Current Dose: 9 Gy  Projected Dose: 61 Gy   Narrative:  The patient presents for routine under treatment assessment.  CBCT/MVCT images/Port film x-rays were reviewed.  The chart was checked. She is doing well but still struggling with neuropathy. She has some irritation in her right breast.   Physical Findings:  Unchanged. Skin is intact. She is sitting in a wheelchair.  Impression:  The patient is tolerating radiation.  Plan:  Continue treatment as planned. Continue radiaplex.

## 2013-11-12 ENCOUNTER — Ambulatory Visit
Admission: RE | Admit: 2013-11-12 | Discharge: 2013-11-12 | Disposition: A | Discharge status: Home / Self Care | Payer: Medicare Other | Source: Ambulatory Visit | Attending: Radiation Oncology | Admitting: Radiation Oncology

## 2013-11-13 ENCOUNTER — Ambulatory Visit: Payer: Medicare Other

## 2013-11-13 ENCOUNTER — Other Ambulatory Visit: Payer: Self-pay | Admitting: Physician Assistant

## 2013-11-14 ENCOUNTER — Ambulatory Visit
Admission: RE | Admit: 2013-11-14 | Discharge: 2013-11-14 | Disposition: A | Discharge status: Home / Self Care | Payer: Medicare Other | Source: Ambulatory Visit | Attending: Radiation Oncology | Admitting: Radiation Oncology

## 2013-11-17 ENCOUNTER — Ambulatory Visit
Admission: RE | Admit: 2013-11-17 | Discharge: 2013-11-17 | Disposition: A | Discharge status: Home / Self Care | Payer: Medicare Other | Source: Ambulatory Visit | Attending: Radiation Oncology | Admitting: Radiation Oncology

## 2013-11-18 ENCOUNTER — Other Ambulatory Visit: Payer: Self-pay | Admitting: *Deleted

## 2013-11-18 ENCOUNTER — Ambulatory Visit: Payer: Medicare Other

## 2013-11-18 ENCOUNTER — Ambulatory Visit: Payer: Medicare Other | Admitting: Radiation Oncology

## 2013-11-18 DIAGNOSIS — G609 Hereditary and idiopathic neuropathy, unspecified: Secondary | ICD-10-CM

## 2013-11-18 DIAGNOSIS — C50919 Malignant neoplasm of unspecified site of unspecified female breast: Secondary | ICD-10-CM

## 2013-11-18 MED ORDER — LORAZEPAM 0.5 MG PO TABS: 0.5000 mg | ORAL_TABLET | Freq: Every evening | ORAL | Status: DC | PRN

## 2013-11-18 MED ORDER — GABAPENTIN 300 MG PO CAPS: 300.0000 mg | ORAL_CAPSULE | Freq: Two times a day (BID) | ORAL | Status: DC

## 2013-11-19 ENCOUNTER — Ambulatory Visit
Admission: RE | Admit: 2013-11-19 | Discharge: 2013-11-19 | Disposition: A | Discharge status: Home / Self Care | Payer: Medicare Other | Source: Ambulatory Visit | Attending: Radiation Oncology | Admitting: Radiation Oncology

## 2013-11-19 ENCOUNTER — Other Ambulatory Visit: Payer: Self-pay | Admitting: *Deleted

## 2013-11-19 DIAGNOSIS — Z9889 Other specified postprocedural states: Secondary | ICD-10-CM

## 2013-11-19 MED ORDER — HYDROCODONE-ACETAMINOPHEN 5-325 MG PO TABS: 1.0000 | ORAL_TABLET | ORAL | Status: DC | PRN

## 2013-11-20 ENCOUNTER — Ambulatory Visit: Payer: Medicare Other

## 2013-11-20 ENCOUNTER — Encounter (INDEPENDENT_AMBULATORY_CARE_PROVIDER_SITE_OTHER): Payer: Medicare Other | Admitting: General Surgery

## 2013-11-21 ENCOUNTER — Ambulatory Visit
Admission: RE | Admit: 2013-11-21 | Discharge: 2013-11-21 | Disposition: A | Discharge status: Home / Self Care | Payer: Medicare Other | Source: Ambulatory Visit | Attending: Radiation Oncology | Admitting: Radiation Oncology

## 2013-11-21 VITALS — BP 118/71 | HR 89 | Temp 98.7°F | Ht 64.0 in

## 2013-11-21 DIAGNOSIS — C50119 Malignant neoplasm of central portion of unspecified female breast: Secondary | ICD-10-CM

## 2013-11-21 NOTE — Progress Notes (Signed)
Tabitha Ramirez has had 10 fractions to her right breast.  She denies pain now but is having occasional sharp pains in her right breast.  She is taking 2 norco per day for this.  She reports fatigue.  She has lost 1 lb since last week but says she is eating good.  She does not want to take to the dietician at this time.  She is drinking a boost once a day.  She has some hyperpigmentation over the scar on her right breast where the stiches were.  She is using radiaplex twice a day.

## 2013-11-21 NOTE — Progress Notes (Signed)
Weekly Management Note Current Dose: 18  Gy  Projected Dose: 61 Gy   Narrative:  The patient presents for routine under treatment assessment.  CBCT/MVCT images/Port film x-rays were reviewed.  The chart was checked. Doing well. Some occasional sharp pains in the breast. Using radiaplex. Neuropathy continues  Physical Findings: Weight:  . Unchanged. Minimal skin darkness.  Impression:  The patient is tolerating radiation.  Plan:  Continue treatment as planned. Continue radiaplex. Discussed possible referral to OT for assistive devices.

## 2013-11-24 ENCOUNTER — Ambulatory Visit
Admission: RE | Admit: 2013-11-24 | Discharge: 2013-11-24 | Disposition: A | Discharge status: Home / Self Care | Payer: Medicare Other | Source: Ambulatory Visit | Attending: Radiation Oncology | Admitting: Radiation Oncology

## 2013-11-25 ENCOUNTER — Ambulatory Visit: Payer: Medicare Other | Admitting: Radiation Oncology

## 2013-11-25 ENCOUNTER — Ambulatory Visit
Admission: RE | Admit: 2013-11-25 | Discharge: 2013-11-25 | Disposition: A | Discharge status: Home / Self Care | Payer: Medicare Other | Source: Ambulatory Visit | Attending: Radiation Oncology | Admitting: Radiation Oncology

## 2013-11-25 DIAGNOSIS — C50119 Malignant neoplasm of central portion of unspecified female breast: Secondary | ICD-10-CM

## 2013-11-25 NOTE — Progress Notes (Signed)
Weekly Management Note Current Dose: 21.6  Gy  Projected Dose:61  Gy   Narrative:  The patient presents for routine under treatment assessment.  CBCT/MVCT images/Port film x-rays were reviewed.  The chart was checked. Saw on tx machine for electron set up. Doing well. No new issues.   Physical Findings: Weight:  . Unchanged. Minimal skin darkening.   Impression:  The patient is tolerating radiation.  Plan:  Continue treatment as planned.Contineu radiaplex.

## 2013-11-26 ENCOUNTER — Ambulatory Visit
Admission: RE | Admit: 2013-11-26 | Discharge: 2013-11-26 | Disposition: A | Discharge status: Home / Self Care | Payer: Medicare Other | Source: Ambulatory Visit | Attending: Radiation Oncology | Admitting: Radiation Oncology

## 2013-11-27 ENCOUNTER — Ambulatory Visit
Admission: RE | Admit: 2013-11-27 | Discharge: 2013-11-27 | Disposition: A | Discharge status: Home / Self Care | Payer: Medicare Other | Source: Ambulatory Visit | Attending: Radiation Oncology | Admitting: Radiation Oncology

## 2013-11-28 ENCOUNTER — Ambulatory Visit
Admission: RE | Admit: 2013-11-28 | Discharge: 2013-11-28 | Disposition: A | Discharge status: Home / Self Care | Payer: Medicare Other | Source: Ambulatory Visit | Attending: Radiation Oncology | Admitting: Radiation Oncology

## 2013-12-01 ENCOUNTER — Ambulatory Visit
Admission: RE | Admit: 2013-12-01 | Discharge: 2013-12-01 | Disposition: A | Discharge status: Home / Self Care | Payer: Medicare Other | Source: Ambulatory Visit | Attending: Radiation Oncology | Admitting: Radiation Oncology

## 2013-12-01 DIAGNOSIS — C50119 Malignant neoplasm of central portion of unspecified female breast: Secondary | ICD-10-CM

## 2013-12-01 MED ORDER — RADIAPLEXRX EX GEL
Freq: Once | CUTANEOUS | Status: AC
  Administered 2013-12-01: 09:00:00 via TOPICAL

## 2013-12-02 ENCOUNTER — Ambulatory Visit
Admission: RE | Admit: 2013-12-02 | Discharge: 2013-12-02 | Disposition: A | Discharge status: Home / Self Care | Payer: Medicare Other | Source: Ambulatory Visit | Attending: Radiation Oncology | Admitting: Radiation Oncology

## 2013-12-02 VITALS — BP 92/66 | HR 94 | Temp 99.0°F | Wt 133.4 lb

## 2013-12-02 DIAGNOSIS — C50119 Malignant neoplasm of central portion of unspecified female breast: Secondary | ICD-10-CM

## 2013-12-02 NOTE — Progress Notes (Signed)
Patient here for weekly assessment of radiation to right breast.Mild discoloration of skin, no peeling.continue application of radiaplex.Had chemo-induced neuropathy of hands and feet.Denies pain.

## 2013-12-02 NOTE — Progress Notes (Signed)
  Radiation Oncology         [phone] ________________________________  Name: Tabitha Ramirez MRN: [id number]  Date: 12/02/2013  DOB: 07/29/1946  Weekly Radiation Therapy Management  Current Dose: 30.6 Gy     Planned Dose:  61 Gy  Narrative . . . . . . . . The patient presents for routine under treatment assessment.                                   The patient is without complaint.                                 Set-up films were reviewed.                                 The chart was checked. Physical Findings. . .  weight is 133 lb 6.4 oz (60.51 kg). Her temperature is 99 F (37.2 C). Her blood pressure is 92/66 and her pulse is 94. . Weight essentially stable. Hyperpigmentation changes noted in the right breast region. Impression . . . . . . . The patient is tolerating radiation. Plan . . . . . . . . . . . . Continue treatment as planned.  ________________________________ -----------------------------------  Blair Promise, PhD, MD

## 2013-12-03 ENCOUNTER — Ambulatory Visit
Admission: RE | Admit: 2013-12-03 | Discharge: 2013-12-03 | Disposition: A | Discharge status: Home / Self Care | Payer: Medicare Other | Source: Ambulatory Visit | Attending: Radiation Oncology | Admitting: Radiation Oncology

## 2013-12-04 ENCOUNTER — Ambulatory Visit
Admission: RE | Admit: 2013-12-04 | Discharge: 2013-12-04 | Disposition: A | Discharge status: Home / Self Care | Payer: Medicare Other | Source: Ambulatory Visit | Attending: Radiation Oncology | Admitting: Radiation Oncology

## 2013-12-05 ENCOUNTER — Ambulatory Visit
Admission: RE | Admit: 2013-12-05 | Discharge: 2013-12-05 | Disposition: A | Discharge status: Home / Self Care | Payer: Medicare Other | Source: Ambulatory Visit | Attending: Radiation Oncology | Admitting: Radiation Oncology

## 2013-12-05 ENCOUNTER — Other Ambulatory Visit: Payer: Self-pay | Admitting: Physician Assistant

## 2013-12-05 DIAGNOSIS — C50119 Malignant neoplasm of central portion of unspecified female breast: Secondary | ICD-10-CM

## 2013-12-08 ENCOUNTER — Ambulatory Visit
Admission: RE | Admit: 2013-12-08 | Discharge: 2013-12-08 | Disposition: A | Discharge status: Home / Self Care | Payer: Medicare Other | Source: Ambulatory Visit | Attending: Radiation Oncology | Admitting: Radiation Oncology

## 2013-12-09 ENCOUNTER — Ambulatory Visit
Admission: RE | Admit: 2013-12-09 | Discharge: 2013-12-09 | Disposition: A | Discharge status: Home / Self Care | Payer: Medicare Other | Source: Ambulatory Visit | Attending: Radiation Oncology | Admitting: Radiation Oncology

## 2013-12-09 VITALS — BP 114/60 | HR 94 | Temp 98.5°F | Wt 136.1 lb

## 2013-12-09 DIAGNOSIS — C50119 Malignant neoplasm of central portion of unspecified female breast: Secondary | ICD-10-CM

## 2013-12-09 NOTE — Progress Notes (Signed)
Patient for weekly assessment of radiation to right breast.Completed 22 of 25 treatments.Mild discoloration.No peeling or breaks in skin.Shooting pain.Mild fatigue.

## 2013-12-09 NOTE — Progress Notes (Signed)
Weekly Management Note Current Dose: 39.6  Gy  Projected Dose: 60.4 Gy   Narrative:  The patient presents for routine under treatment assessment.  CBCT/MVCT images/Port film x-rays were reviewed.  The chart was checked. Doing well. Neuropathy is maybe slightly better. Skin is sore with some shooting pain but mild.   Physical Findings: Weight: 136 lb 1.6 oz (61.735 kg). Dark right breast.  Impression:  The patient is tolerating radiation.  Plan:  Continue treatment as planned. Continue radiaplex.

## 2013-12-10 ENCOUNTER — Ambulatory Visit
Admission: RE | Admit: 2013-12-10 | Discharge: 2013-12-10 | Disposition: A | Discharge status: Home / Self Care | Payer: Medicare Other | Source: Ambulatory Visit | Attending: Radiation Oncology | Admitting: Radiation Oncology

## 2013-12-10 ENCOUNTER — Ambulatory Visit: Payer: Medicare Other

## 2013-12-11 ENCOUNTER — Ambulatory Visit
Admission: RE | Admit: 2013-12-11 | Discharge: 2013-12-11 | Disposition: A | Discharge status: Home / Self Care | Payer: Medicare Other | Source: Ambulatory Visit | Attending: Radiation Oncology | Admitting: Radiation Oncology

## 2013-12-11 ENCOUNTER — Ambulatory Visit: Payer: Medicare Other

## 2013-12-12 ENCOUNTER — Ambulatory Visit: Payer: Medicare Other

## 2013-12-12 ENCOUNTER — Ambulatory Visit
Admission: RE | Admit: 2013-12-12 | Discharge: 2013-12-12 | Disposition: A | Discharge status: Home / Self Care | Payer: Medicare Other | Source: Ambulatory Visit | Attending: Radiation Oncology | Admitting: Radiation Oncology

## 2013-12-12 ENCOUNTER — Other Ambulatory Visit: Payer: Self-pay | Admitting: *Deleted

## 2013-12-12 ENCOUNTER — Telehealth: Payer: Self-pay | Admitting: *Deleted

## 2013-12-12 DIAGNOSIS — Z9889 Other specified postprocedural states: Secondary | ICD-10-CM

## 2013-12-12 MED ORDER — HYDROCODONE-ACETAMINOPHEN 5-325 MG PO TABS: 1.0000 | ORAL_TABLET | Freq: Four times a day (QID) | ORAL | Status: DC | PRN

## 2013-12-15 ENCOUNTER — Ambulatory Visit
Admission: RE | Admit: 2013-12-15 | Discharge: 2013-12-15 | Disposition: A | Discharge status: Home / Self Care | Payer: Medicare Other | Source: Ambulatory Visit | Attending: Radiation Oncology | Admitting: Radiation Oncology

## 2013-12-15 ENCOUNTER — Ambulatory Visit: Payer: Medicare Other

## 2013-12-16 ENCOUNTER — Ambulatory Visit
Admission: RE | Admit: 2013-12-16 | Discharge: 2013-12-16 | Disposition: A | Discharge status: Home / Self Care | Payer: Medicare Other | Source: Ambulatory Visit | Attending: Radiation Oncology | Admitting: Radiation Oncology

## 2013-12-16 ENCOUNTER — Encounter: Payer: Self-pay | Admitting: Radiation Oncology

## 2013-12-16 VITALS — BP 117/78 | HR 91 | Temp 98.7°F | Resp 20 | Wt 134.4 lb

## 2013-12-16 DIAGNOSIS — C50119 Malignant neoplasm of central portion of unspecified female breast: Secondary | ICD-10-CM

## 2013-12-16 NOTE — Addendum Note (Signed)
Encounter addended by: Thea Silversmith, MD on: 12/16/2013  3:20 PM<BR>     Documentation filed: Notes Section

## 2013-12-16 NOTE — Progress Notes (Addendum)
Weekly Management Note Current Dose: 49  Gy  Projected Dose: 61 Gy   Narrative:  The patient presents for routine under treatment assessment.  CBCT/MVCT images/Port film x-rays were reviewed.  The chart was checked. Neuropathy stable. Skin is not too irritated. Using radiaplex.   Physical Findings: Weight: 134 lb 6.4 oz (60.963 kg). In a wheelchair. Some slight darkness of right breast.   Impression:  The patient is tolerating radiation.  Plan:  Continue treatment as planned. Continue radiaplex.

## 2013-12-16 NOTE — Progress Notes (Signed)
Weekly rad txs rt chest wall, 27/33 completed, slight discoloration, skin intact, only c/o neuropathy in hands and feerr, loss 2 lbs this wee, applesauce and toast thia sm, no nausea, radiaplex bid, energy level  So so,  9:40 AM

## 2013-12-17 ENCOUNTER — Ambulatory Visit
Admission: RE | Admit: 2013-12-17 | Discharge: 2013-12-17 | Disposition: A | Discharge status: Home / Self Care | Payer: Medicare Other | Source: Ambulatory Visit | Attending: Radiation Oncology | Admitting: Radiation Oncology

## 2013-12-18 ENCOUNTER — Ambulatory Visit
Admission: RE | Admit: 2013-12-18 | Discharge: 2013-12-18 | Disposition: A | Discharge status: Home / Self Care | Payer: Medicare Other | Source: Ambulatory Visit | Attending: Radiation Oncology | Admitting: Radiation Oncology

## 2013-12-19 ENCOUNTER — Ambulatory Visit: Payer: Medicare Other

## 2013-12-19 ENCOUNTER — Ambulatory Visit
Admission: RE | Admit: 2013-12-19 | Discharge: 2013-12-19 | Disposition: A | Discharge status: Home / Self Care | Payer: Medicare Other | Source: Ambulatory Visit | Attending: Radiation Oncology | Admitting: Radiation Oncology

## 2013-12-22 ENCOUNTER — Ambulatory Visit
Admission: RE | Admit: 2013-12-22 | Discharge: 2013-12-22 | Disposition: A | Discharge status: Home / Self Care | Payer: Medicare Other | Source: Ambulatory Visit | Attending: Radiation Oncology | Admitting: Radiation Oncology

## 2013-12-22 ENCOUNTER — Ambulatory Visit: Payer: Medicare Other

## 2013-12-23 ENCOUNTER — Encounter: Payer: Self-pay | Admitting: Radiation Oncology

## 2013-12-23 ENCOUNTER — Other Ambulatory Visit: Payer: Self-pay | Admitting: Radiation Oncology

## 2013-12-23 ENCOUNTER — Ambulatory Visit
Admission: RE | Admit: 2013-12-23 | Discharge: 2013-12-23 | Disposition: A | Discharge status: Home / Self Care | Payer: Medicare Other | Source: Ambulatory Visit | Attending: Radiation Oncology | Admitting: Radiation Oncology

## 2013-12-23 ENCOUNTER — Ambulatory Visit: Payer: Medicare Other

## 2013-12-23 VITALS — BP 123/78 | HR 95 | Temp 98.5°F | Resp 20 | Wt 131.8 lb

## 2013-12-23 DIAGNOSIS — R300 Dysuria: Secondary | ICD-10-CM

## 2013-12-23 DIAGNOSIS — C50119 Malignant neoplasm of central portion of unspecified female breast: Secondary | ICD-10-CM

## 2013-12-23 LAB — URINALYSIS, MICROSCOPIC - CHCC
BLOOD: NEGATIVE
Bilirubin (Urine): NEGATIVE
Glucose: NEGATIVE mg/dL
Ketones: NEGATIVE mg/dL
Nitrite: NEGATIVE
PH: 6 (ref 4.6–8.0)
Protein: <30 mg/dL
SPECIFIC GRAVITY, URINE: 1.025 (ref 1.003–1.035)
UROBILINOGEN UR: 0.2 mg/dL (ref 0.2–1)

## 2013-12-23 MED ORDER — CIPROFLOXACIN HCL 250 MG PO TABS: 250.0000 mg | ORAL_TABLET | Freq: Two times a day (BID) | ORAL | Status: DC

## 2013-12-23 NOTE — Progress Notes (Signed)
Weekly Management Note Current Dose:  45 Gy  Projected Dose: 61 Gy   Narrative:  The patient presents for routine under treatment assessment.  CBCT/MVCT images/Port film x-rays were reviewed.  The chart was checked. Doing well. Dysuria occasional for past week. Using walker and home. Declines PT referral. "my son in law is familiar with this." Declines home health referral Daughter is cooking meals. Low appetite.   Physical Findings: Weight: 131 lb 12.8 oz (59.784 kg). Dry desquamation. No moist desquamation.   Impression:  The patient is tolerating radiation.  Plan:  Continue treatment as planned. Continue radiaplex. [facility] referral made. Has appt with med onc in next few week. Follow up in 1 month. UA for dysuria.

## 2013-12-23 NOTE — Progress Notes (Signed)
Weekly radiation tx right breast/chest wall, 32/33 completed,  hyperpigmentation,dark discoloration, skin intact,  Using radipalex bid,has occasional pain in that area, neuropathy in hands and feet, still difficult walking, poor appetite , not drinking many fluids stated,loss 3 lbs since last week, gave flyer for [facility] ,will think on this, 1 month f/u appt card ,to continue radiaplex acouple weeks then lotion with vitamin e   9:46 AM

## 2013-12-24 ENCOUNTER — Ambulatory Visit
Admission: RE | Admit: 2013-12-24 | Discharge: 2013-12-24 | Disposition: A | Discharge status: Home / Self Care | Payer: Medicare Other | Source: Ambulatory Visit | Attending: Radiation Oncology | Admitting: Radiation Oncology

## 2013-12-24 ENCOUNTER — Encounter: Payer: Self-pay | Admitting: Radiation Oncology

## 2013-12-24 LAB — URINE CULTURE

## 2013-12-24 NOTE — Progress Notes (Signed)
Patient  And husband informed cipro antibiotic sent electronically to [place] [facility].To start today.Informed to push fluids especially water.Has follow up appointment to return in one month.Knows to call if has any problems.

## 2014-01-07 ENCOUNTER — Other Ambulatory Visit: Payer: Self-pay | Admitting: Physician Assistant

## 2014-01-07 DIAGNOSIS — C50119 Malignant neoplasm of central portion of unspecified female breast: Secondary | ICD-10-CM

## 2014-01-09 NOTE — Progress Notes (Signed)
Name: GEORJEAN TOYA   MRN: [id number]  Date:  11/21/13   DOB: 02/21/1946  Status:outpatient    DIAGNOSIS: Breast cancer.  CONSENT VERIFIED: yes   SET UP: Patient is setup supine   IMMOBILIZATION:  The following immobilization was used:Custom Moldable Pillow, breast board.   NARRATIVE: [name] underwent complex simulation and treatment planning for her boost treatment today.  Her tumor volume was outlined on the planning CT scan. The depth of her cavity was felt to be appropriate for treatment with electrons    12  MeV electrons will be prescribed to the 95% isodose line.  I personally oversaw and approved the construction of a unique block which will be used for beam modification purposes.  A special port plan is requested.

## 2014-01-09 NOTE — Progress Notes (Signed)
  Radiation Oncology         [phone] ________________________________  Name: Tabitha Ramirez MRN: [id number]  Date: 12/24/2013  DOB: 1946-08-22  End of Treatment Note  Diagnosis:   T2N1 Right breast cancer  Indication for treatment:  Curative       Radiation treatment dates:   2/11-12/24/13  Site/dose:    Right breast / 75 Gray @ 1.8 [place] Cables per fraction x 25 fractions Right supraclavicular fossa / 45 Gy @1 .8 Gy per fraction x 25 fractions Right breast boost / 16 Gray at [employer] per fraction x 8 fractions  Beams/energy:  Opposed Tangents / 6 MV photons LAO / 6 MV photons Enface electrons with 12MeV  Narrative: The patient tolerated radiation treatment relatively well.   She continued to struggle with neuropathy and had the expected skin dry desquamation.   Plan: The patient has completed radiation treatment. The patient will return to radiation oncology clinic for routine followup in one month. I advised them to call or return sooner if they have any questions or concerns related to their recovery or treatment.  ------------------------------------------------  Thea Silversmith, MD

## 2014-01-09 NOTE — Progress Notes (Signed)
  Radiation Oncology         [phone] ________________________________  Name: Tabitha Ramirez MRN: [id number]  Date: 11/04/2013  DOB: December 12, 1945  Simulation Verification Note  Status: outpatient  NARRATIVE: The patient was brought to the treatment unit and placed in the planned treatment position. The clinical setup was verified. Then port films were obtained and uploaded to the radiation oncology medical record software.  The treatment beams were carefully compared against the planned radiation fields. The position location and shape of the radiation fields was reviewed. The targeted volume of tissue appears appropriately covered by the radiation beams. Organs at risk appear to be excluded as planned.  Based on my personal review, I approved the simulation verification. The patient's treatment will proceed as planned.  ------------------------------------------------  Thea Silversmith, MD

## 2014-01-09 NOTE — Addendum Note (Signed)
Encounter addended by: Thea Silversmith, MD on: 01/09/2014  1:54 PM<BR>     Documentation filed: Notes Section

## 2014-01-20 ENCOUNTER — Ambulatory Visit (HOSPITAL_BASED_OUTPATIENT_CLINIC_OR_DEPARTMENT_OTHER): Payer: Medicare Other | Admitting: Physician Assistant

## 2014-01-20 ENCOUNTER — Ambulatory Visit (HOSPITAL_BASED_OUTPATIENT_CLINIC_OR_DEPARTMENT_OTHER): Payer: Medicare Other

## 2014-01-20 ENCOUNTER — Telehealth: Payer: Self-pay | Admitting: Oncology

## 2014-01-20 ENCOUNTER — Other Ambulatory Visit (HOSPITAL_BASED_OUTPATIENT_CLINIC_OR_DEPARTMENT_OTHER): Payer: Medicare Other

## 2014-01-20 ENCOUNTER — Telehealth: Payer: Self-pay | Admitting: Physician Assistant

## 2014-01-20 ENCOUNTER — Encounter: Payer: Self-pay | Admitting: Physician Assistant

## 2014-01-20 ENCOUNTER — Other Ambulatory Visit: Payer: Self-pay | Admitting: *Deleted

## 2014-01-20 VITALS — BP 150/77 | HR 93 | Temp 98.7°F | Resp 20 | Ht 64.0 in | Wt 137.7 lb

## 2014-01-20 DIAGNOSIS — Z853 Personal history of malignant neoplasm of breast: Secondary | ICD-10-CM

## 2014-01-20 DIAGNOSIS — D649 Anemia, unspecified: Secondary | ICD-10-CM

## 2014-01-20 DIAGNOSIS — T451X5A Adverse effect of antineoplastic and immunosuppressive drugs, initial encounter: Secondary | ICD-10-CM

## 2014-01-20 DIAGNOSIS — C50119 Malignant neoplasm of central portion of unspecified female breast: Secondary | ICD-10-CM

## 2014-01-20 DIAGNOSIS — C50111 Malignant neoplasm of central portion of right female breast: Secondary | ICD-10-CM

## 2014-01-20 DIAGNOSIS — F419 Anxiety disorder, unspecified: Secondary | ICD-10-CM | POA: Insufficient documentation

## 2014-01-20 DIAGNOSIS — E876 Hypokalemia: Secondary | ICD-10-CM

## 2014-01-20 DIAGNOSIS — D539 Nutritional anemia, unspecified: Secondary | ICD-10-CM | POA: Insufficient documentation

## 2014-01-20 DIAGNOSIS — D72829 Elevated white blood cell count, unspecified: Secondary | ICD-10-CM

## 2014-01-20 DIAGNOSIS — M792 Neuralgia and neuritis, unspecified: Secondary | ICD-10-CM

## 2014-01-20 DIAGNOSIS — R6884 Jaw pain: Secondary | ICD-10-CM

## 2014-01-20 DIAGNOSIS — G609 Hereditary and idiopathic neuropathy, unspecified: Secondary | ICD-10-CM

## 2014-01-20 DIAGNOSIS — G620 Drug-induced polyneuropathy: Secondary | ICD-10-CM

## 2014-01-20 DIAGNOSIS — C50919 Malignant neoplasm of unspecified site of unspecified female breast: Secondary | ICD-10-CM

## 2014-01-20 LAB — CBC WITH DIFFERENTIAL/PLATELET
BASO%: 0.6 % (ref 0.0–2.0)
Basophils Absolute: 0 10*3/uL (ref 0.0–0.1)
EOS%: 2.3 % (ref 0.0–7.0)
Eosinophils Absolute: 0.1 10*3/uL (ref 0.0–0.5)
HEMATOCRIT: 27.4 % — AB (ref 34.8–46.6)
HGB: 9.2 g/dL — ABNORMAL LOW (ref 11.6–15.9)
LYMPH#: 1.7 10*3/uL (ref 0.9–3.3)
LYMPH%: 31 % (ref 14.0–49.7)
MCH: 33.7 pg (ref 25.1–34.0)
MCHC: 33.5 g/dL (ref 31.5–36.0)
MCV: 100.7 fL (ref 79.5–101.0)
MONO#: 0.7 10*3/uL (ref 0.1–0.9)
MONO%: 13.1 % (ref 0.0–14.0)
NEUT#: 2.9 10*3/uL (ref 1.5–6.5)
NEUT%: 53 % (ref 38.4–76.8)
Platelets: 264 10*3/uL (ref 145–400)
RBC: 2.72 10*6/uL — ABNORMAL LOW (ref 3.70–5.45)
RDW: 15.2 % — AB (ref 11.2–14.5)
WBC: 5.5 10*3/uL (ref 3.9–10.3)

## 2014-01-20 LAB — COMPREHENSIVE METABOLIC PANEL (CC13)
ALT: 35 U/L (ref 0–55)
AST: 34 U/L (ref 5–34)
Albumin: 3.2 g/dL — ABNORMAL LOW (ref 3.5–5.0)
Alkaline Phosphatase: 101 U/L (ref 40–150)
Anion Gap: 10 mEq/L (ref 3–11)
BUN: 11.6 mg/dL (ref 7.0–26.0)
CALCIUM: 9.8 mg/dL (ref 8.4–10.4)
CO2: 26 meq/L (ref 22–29)
CREATININE: 0.8 mg/dL (ref 0.6–1.1)
Chloride: 108 mEq/L (ref 98–109)
Glucose: 106 mg/dl (ref 70–140)
Potassium: 3.8 mEq/L (ref 3.5–5.1)
Sodium: 145 mEq/L (ref 136–145)
TOTAL PROTEIN: 7.4 g/dL (ref 6.4–8.3)
Total Bilirubin: 0.28 mg/dL (ref 0.20–1.20)

## 2014-01-20 LAB — FERRITIN CHCC: Ferritin: 937 ng/ml — ABNORMAL HIGH (ref 9–269)

## 2014-01-20 LAB — RETICULOCYTES
Immature Retic Fract: 11 % — ABNORMAL HIGH (ref 1.60–10.00)
RBC: 2.77 10*6/uL — ABNORMAL LOW (ref 3.70–5.45)
Retic %: 2.4 % — ABNORMAL HIGH (ref 0.70–2.10)
Retic Ct Abs: 66.48 10*3/uL (ref 33.70–90.70)

## 2014-01-20 LAB — FOLATE: FOLATE: 9.6 ng/mL

## 2014-01-20 LAB — VITAMIN B12: VITAMIN B 12: 623 pg/mL (ref 211–911)

## 2014-01-20 MED ORDER — GABAPENTIN 300 MG PO CAPS: 300.0000 mg | ORAL_CAPSULE | Freq: Four times a day (QID) | ORAL | Status: DC

## 2014-01-20 MED ORDER — TRAMADOL HCL 50 MG PO TABS: 50.0000 mg | ORAL_TABLET | Freq: Four times a day (QID) | ORAL | Status: DC | PRN

## 2014-01-20 MED ORDER — LORAZEPAM 0.5 MG PO TABS: 0.5000 mg | ORAL_TABLET | Freq: Every evening | ORAL | Status: DC | PRN

## 2014-01-20 NOTE — Telephone Encounter (Signed)
per pof to sch app/sch mamma/[place]-printed sch adn gave pt copy of sch-per pof send pt back to lab-will call w/ GN appt

## 2014-01-20 NOTE — Progress Notes (Signed)
ID: [name] OB: 03-24-1946  MR#: [id number]  CSN#:[id number]  PCP: Clarene Duke, MD GYN:  Cheri Fowler, MD SU: Fanny Skates, MD OTHER MD: Thea Silversmith, MD;  Anson Fret, MD  CHIEF COMPLAINT:  Hx of Right Breast Cancer  HISTORY OF PRESENT ILLNESS: Sylvia tells me she felt a mass in her right breast about 3 months ago but initially ignored it. She eventually brought it to her physician's attention and was set up for bilateral diagnostic mammography at the [facility] 01/31/2013. This showed an area of distortion in the upper outer quadrant measuring approximately 4.3 cm. Diffuse calcifications in both breasts were stable. The mass was palpable at 12:00, 3 cm from the nipple, and by ultrasound it was irregular, hypoechoic, and measured 2.5 cm. In addition there was prominent right axillary adenopathy the largest lymph node measuring 2.5 cm.  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 Biopsy of the lymph node was negative. It did show some lymph node tissue.  Bilateral breast MRI 02/10/2013 showed a ring-enhancing necrotic-appearing 3.4 cm mass in the anterior third of the right breast. There was also a 2.0 cm level one right axillary lymph node that was prominent, but maintains central fat. The left breast the left axilla were benign and there was no internal mammary adenopathy noted.  The patient's subsequent history is as detailed below.  INTERVAL HISTORY: Elka returns today accompanied by her daughter [name] for followup of her breast cancer. She is currently being followed with observation alone (labs and physical exam) for her triple negative breast cancer.   Maeson again presents in a wheelchair today. Her daughter tells me she is basically "doing everything for her" these days. 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 This has been almost disabling. It causes quite a bit of pain, especially at night. She is on gabapentin, 300 mg twice daily, and also takes tramadol primarily at night for the pain. She is very unsteady on her feet, and has difficulty walking. At home, she utilizes a walker for support.  Dalyce continues to have problems with TMJ which causes severe jaw pain. She continues to have significant fatigue. She has difficulty sleeping and utilizes lorazepam sparingly for anxiety and insomnia.    REVIEW OF SYSTEMS: Donni has had no recent fevers or chills. She denies any rashes or skin changes. Her energy level is low and Genifer feels weak.  She denies any abnormal bruising and has had no abnormal bleeding. Her appetite is good. She's had no nausea, emesis, or change in bowel or bladder habits. She has noticed no blood in the stool, and has had no dark or tarry stools. She denies any cough, phlegm production, pleurisy, or shortness of breath. She also denies any abnormal headaches or dizziness. She denies any myalgias or arthralgias. She's had some intermittent swelling in both feet and ankles.  A detailed review of systems is otherwise stable and noncontributory.  PAST MEDICAL HISTORY: Past Medical History  Diagnosis Date  . Heart murmur   . Hypertension   . Anemia   . Allergy   . Migraines   . History of blood transfusion     "related to chemo/breast cancer" (09/29/2013)  . GERD (gastroesophageal reflux disease)   . Breast cancer   . Arthritis     "joints" (09/29/2013)  .  TMJ (temporomandibular joint syndrome)     "left; just dx'd" (09/29/2013    PAST SURGICAL HISTORY: Past Surgical History  Procedure Laterality Date  . Portacath placement N/A 03/10/2013    Procedure: INSERTION PORT-A-CATH WITH FLUOROSCOPY AND ULTRASOUND;  Surgeon: Adin Hector, MD;  Location: [place];  Service: General;  Laterality: N/A;  . Axillary lymph node biopsy Right 03/10/2013     Procedure: AXILLARY LYMPH NODE BIOPSY;  Surgeon: Adin Hector, MD;  Location: [place];  Service: General;  Laterality: Right;  sentinel node with blue dye  . Esophagogastroduodenoscopy N/A 04/28/2013    Procedure: ESOPHAGOGASTRODUODENOSCOPY (EGD);  Surgeon: Wonda Horner, MD;  Location: [facility];  Service: Endoscopy;  Laterality: N/A;  . Colonoscopy N/A 04/30/2013    Procedure: COLONOSCOPY;  Surgeon: Wonda Horner, MD;  Location: [facility];  Service: Endoscopy;  Laterality: N/A;  . Port-a-cath removal  09/29/2013  . Breast lumpectomy Right 09/29/2013    needle localization w/axillary LND/notes 09/29/2013  . Total abdominal hysterectomy      With bilateral salpingo-oophorectomy  . Tonsillectomy    . Mastectomy Right 09/29/2013    "partial"  . Breast lumpectomy with needle localization and axillary lymph node dissection Right 09/29/2013    Procedure: RIGHT BREAST NEEDLE LOCALIZED LUMPECTOMY AND AXILLARY LYMPH NODE DISSECTION;  Surgeon: Adin Hector, MD;  Location: [place];  Service: General;  Laterality: Right;  . Port-a-cath removal Left 09/29/2013    Procedure: REMOVAL PORT-A-CATH;  Surgeon: Adin Hector, MD;  Location: [place];  Service: General;  Laterality: Left;    FAMILY HISTORY Family History  Problem Relation Age of Onset  . Cancer Mother 54    Unknown type of cancer  . Congestive Heart Failure Mother   . Colon cancer Brother   . Diabetes Brother 11  . Heart disease Brother     AMI 05/22/2012  . Congestive Heart Failure Brother   . Cancer Maternal Grandmother 64    Unknown type of cancer  the patient's father died in an automobile accident at the age of 12. The patient's mother lived to be 41. Lagina has 3 brothers, no sisters. One brother had colon cancer at the age of 84. The patient's mother and the patient's mothers mother (maternal grandmother) both were diagnosed with some kind of cancer at the age of 22 or thereabouts, but Karisma does not know what type of cancer this may  have been.  GYNECOLOGIC HISTORY:   (Reviewed on 01/20/2014)  Menarche age 76, first live birth age 59. The patient had her hysterectomy in the 1970s. She has been on estrogen replacement for she thinks 18 years.  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 She is divorced, lives by herself, with no pets. Her first child was given up for adoption. Her second child, Reggy Eye, is a Theatre stage manager and is working towards a PhD at [employer]. Emon has no grandchildren. She attends new [employer]    ADVANCED DIRECTIVES: In place. The patient has named her daughter [name] as her healthcare power of attorney. [name] can be reached at [phone]. The healthcare power of attorney documents were brought by the patient 03/13/2013 and are separately scanned.   HEALTH MAINTENANCE: ( Updated on 01/20/2014) History  Substance Use Topics  . Smoking status: Former Smoker -- 1 years    Types: Cigarettes  .  Smokeless tobacco: Never Used     Comment: smoked in college 1 yr   . Alcohol Use: No     Comment: Wine occasionally     Colonoscopy: August 2014/[facility]  PAP:Not on file  Bone density: Never  Lipid panel: Dr. Willis Modena    Allergies  Allergen Reactions  . Penicillins Rash   Current Outpatient Prescriptions on File Prior to Visit  Medication Sig Dispense Refill  . Acetaminophen (TYLENOL EXTRA STRENGTH PO) Take 2 tablets by mouth daily as needed (for pain).       . Multiple Vitamin (MULTIVITAMIN WITH MINERALS) TABS Take 1 tablet by mouth daily. LIIQUID - Takes 1 cap full daily      . pantoprazole (PROTONIX) 40 MG tablet Take 1 tablet (40 mg total) by mouth daily at 12 noon.  30 tablet  2  . triamterene-hydrochlorothiazide (MAXZIDE-25) 37.5-25 MG per tablet Take 0.5 tablets by mouth daily.      . DPH-Lido-AlHydr-MgHydr-Simeth  (FIRST-MOUTHWASH BLM MT) Use as directed 15 mLs in the mouth or throat daily.      . hyaluronate sodium (RADIAPLEXRX) GEL Apply 1 application topically 2 (two) times daily.      . ondansetron (ZOFRAN) 8 MG tablet Take 8 mg by mouth every 8 (eight) hours as needed for nausea.       . prochlorperazine (COMPAZINE) 10 MG tablet Take 10 mg by mouth every 6 (six) hours as needed for nausea or vomiting.       No current facility-administered medications on file prior to visit.     Objective: Middle-aged [place] American woman who presents in a wheelchair and appears fatigued.  She was able to step up onto the exam table 40 exam today, but with assistance. Filed Vitals:   01/20/14 0916  BP: 150/77  Pulse: 93  Temp: 98.7 F (37.1 C)  Resp: 20     Body mass index is 23.62 kg/(m^2).    ECOG FS: 2 Filed Weights   01/20/14 0916  Weight: 137 lb 11.2 oz (62.46 kg)   Physical Exam: HEENT:  Sclerae anicteric.  Limited ability to open mouth secondary to TMJ and associated discomfort. Oropharynx clear and moist. Neck supple. Trachea midline.  NODES:  No cervical or supraclavicular lymphadenopathy palpated.  BREAST EXAM:  Right breast is status post lumpectomy and radiation. No suspicious nodularities or skin changes. No evidence of local recurrence. Left breast is unremarkable. Axillae are benign bilaterally, with palpable lymphadenopathy. LUNGS:  Clear to auscultation bilaterally.  No wheezes or rhonchi HEART:  Regular rate and rhythm. No murmur  ABDOMEN:  Soft, nontender. No organomegaly or masses. Positive bowel sounds.  MSK:  No focal spinal tenderness to palpation. Good range of motion bilaterally in the upper extremities. Limited range of motion in the feet and ankles, difficulty with dorsiflexion EXTREMITIES:  Nonpitting pedal edema, equal bilaterally. No erythema. No palpable cords. No lymphedema in the upper tremor these.   SKIN:  No visible rashes. No excessive ecchymoses. No petechiae.   NEURO:  Nonfocal. Well oriented. Fatigued affect. Strength is 3/5 bilaterally in the hands with numbness noted subjectively upon exam. Strength in the feet and ankles is 4/5 with plantar flexion. Unable to dorsiflex with either the right or left foot. Numbness noted in both feet subjectively.    LAB RESULTS:   Lab Results  Component Value Date   WBC 5.5 01/20/2014   NEUTROABS 2.9 01/20/2014   HGB 9.2* 01/20/2014   HCT 27.4* 01/20/2014   MCV 100.7 01/20/2014  PLT 264 01/20/2014      Chemistry      Component Value Date/Time   NA 145 01/20/2014 0901   NA 142 09/16/2013 1000   K 3.8 01/20/2014 0901   K 3.4* 09/16/2013 1000   CL 101 09/16/2013 1000   CL 100 03/13/2013 0918   CO2 26 01/20/2014 0901   CO2 27 09/16/2013 1000   BUN 11.6 01/20/2014 0901   BUN 13 09/16/2013 1000   CREATININE 0.8 01/20/2014 0901   CREATININE 0.94 09/16/2013 1000   CREATININE 0.94 11/29/2011 1535      Component Value Date/Time   CALCIUM 9.8 01/20/2014 0901   CALCIUM 9.6 09/16/2013 1000   ALKPHOS 101 01/20/2014 0901   ALKPHOS 89 09/16/2013 1000   AST 34 01/20/2014 0901   AST 20 09/16/2013 1000   ALT 35 01/20/2014 0901   ALT 21 09/16/2013 1000   BILITOT 0.28 01/20/2014 0901   BILITOT 0.3 09/16/2013 1000       STUDIES: Bilateral mammogram is due in May 2015.   ASSESSMENT: 68 y.o. [place] woman   (1)  status post right central breast biopsy 01/31/2013 for a clinical T2 NX, stage II invasive ductal carcinoma, grade 3, triple negative, with an MIB-1 of 84%  (2) biopsy of an enlarged (2.5 cm) right axillary lymph node 01/31/2013 was negative, felt possibly discordant  (3) sentinel lymph node sampling 03/10/2013 showed one of two sentinel nodes to be positive; repeat prognostic now showing estrogen receptor 3% positive with weak staining and progesterone receptor 6% positive, with moderate staining. Repeat MIB-1 was 50%. HER-2/neu was again not amplified  (4) neoadjuvant doxorubicin and cyclophosphamide  being given in dose dense fashion with Neulasta support starting 03/17/2013, followed by weekly carboplatin and paclitaxel x12, with final cycle 08/25/2013  (5) status post left lumpectomy and axillary lymph node dissection 09/29/2013 for a ypT1c ypN0 invasive ductal carcinoma, grade 2, estrogen and progesterone receptor again negative, with insufficient tissue for HER-2 retesting.  (6) radiation therapy, completed 12/24/2013  (7) chemotherapy-induced anemia.  Status post blood transfusion, 2 units of packed red blood cells, on 06/10/2013 and again on 08/26/2013.  Now with continued anemia, being investigated further  (8) chemotherapy-induced peripheral neuropathy, grade 3  PLAN: The majority of our 45 minute appointment today was spent reviewing Keishawn's current medical issues, discussing treatment options, and coordinating care.  I am very concerned about Mahari today. I think it would be prudent to proceed with further neurologic evaluation in light of her severe neuropathy, and I am referring her accordingly to [facility].   In the meanwhile, I am increasing her dose of gabapentin. For the next 2 weeks she will take 300 mg in the morning, 300 mg at dinner, and 300 mg at bedtime. She will then add a mid day dose, basically taking 300 mg with meals and bedtime from that point forward. She understands that she needs to increase this dose very slowly. She knows to be especially careful while taking these medications as the gabapentin can make her feel groggy. If she feels sleepy, dizzy, or groggy during the day, I have asked her to decrease back to her original dosage.  I have refilled her tramadol which she takes primarily at night, 1-2 tablets at bedtime for neuropathic pain. I also refilled her lorazepam which she takes sparingly, usually for insomnia.  I am also concerned about her continued anemia,  decreasing from 10.2 in February to 9.2 today, now that she has completed  chemotherapy.  I'm going to obtain several labs today to evaluate further, including a reticulocyte count, ferritin, folate, and B12 today. If all of these are within normal range, I will also consider a referral back to her gastroenterologist, Dr. Penelope Coop, to evaluate for any GI bleed. (As noted above, and denies any signs of blood in the stool.)  Angelic is due for her next bilateral mammogram next month, and May 2015, and I will see her approximately 8-10 weeks for followup to re-assess all of the above issues.  The above plan has been reviewed in detail with both [name] and [name] today, and they were also given details of this plan in writing. They both voice understanding and agreement with this plan, and will call with any changes or problems.   Theotis Burrow, PA-C   01/20/2014 4:22 PM

## 2014-01-20 NOTE — Telephone Encounter (Signed)
GN called pt to give appt time & date

## 2014-01-21 ENCOUNTER — Telehealth: Payer: Self-pay | Admitting: *Deleted

## 2014-01-21 ENCOUNTER — Other Ambulatory Visit: Payer: Self-pay | Admitting: Physician Assistant

## 2014-01-21 DIAGNOSIS — D649 Anemia, unspecified: Secondary | ICD-10-CM

## 2014-01-21 NOTE — Telephone Encounter (Signed)
Called pt to inform her of our decision to refer her to Dr. Penelope Coop reason being that her labs does not explain the worsening anemia. I did remind pt to be extra careful to avoid falls and to use her walker and lighted space when getting up at night. Pt states that she tries to be independent as possible but will ask for help. I told pt she will receive a call as to when appt will be with Dr. Penelope Coop. Pt verbalized understanding. Message to be forwarded to [employer], PA-C.

## 2014-01-21 NOTE — Telephone Encounter (Signed)
Open in error

## 2014-01-26 ENCOUNTER — Ambulatory Visit (INDEPENDENT_AMBULATORY_CARE_PROVIDER_SITE_OTHER): Payer: Medicare Other | Admitting: Neurology

## 2014-01-26 ENCOUNTER — Encounter: Payer: Self-pay | Admitting: Neurology

## 2014-01-26 VITALS — BP 125/80 | HR 94 | Ht 64.0 in | Wt 136.0 lb

## 2014-01-26 DIAGNOSIS — IMO0002 Reserved for concepts with insufficient information to code with codable children: Secondary | ICD-10-CM

## 2014-01-26 DIAGNOSIS — M792 Neuralgia and neuritis, unspecified: Secondary | ICD-10-CM

## 2014-01-26 DIAGNOSIS — G609 Hereditary and idiopathic neuropathy, unspecified: Secondary | ICD-10-CM | POA: Insufficient documentation

## 2014-01-26 DIAGNOSIS — G620 Drug-induced polyneuropathy: Secondary | ICD-10-CM

## 2014-01-26 DIAGNOSIS — R269 Unspecified abnormalities of gait and mobility: Secondary | ICD-10-CM | POA: Insufficient documentation

## 2014-01-26 DIAGNOSIS — T451X5A Adverse effect of antineoplastic and immunosuppressive drugs, initial encounter: Secondary | ICD-10-CM

## 2014-01-26 DIAGNOSIS — C50119 Malignant neoplasm of central portion of unspecified female breast: Secondary | ICD-10-CM

## 2014-01-26 DIAGNOSIS — C50919 Malignant neoplasm of unspecified site of unspecified female breast: Secondary | ICD-10-CM

## 2014-01-26 MED ORDER — GABAPENTIN 300 MG PO CAPS: 600.0000 mg | ORAL_CAPSULE | Freq: Three times a day (TID) | ORAL | Status: DC

## 2014-01-26 NOTE — Progress Notes (Signed)
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 Her primary care physician is Dr. Willis Modena her oncologist is Dr. Jana Hakim  She was diagnosed with clinical T2 NX, stage II invasive ductal carcinoma, grade 3, triple negative, with an MIB-1 of 84% , underwent right lumpectomy and right axillary lymph node dissection on 09/29/2013. The pathology from that procedure ([id number]) showed an area of approximately 1.1 cm showing residual invasive nests and cells of ductal carcinoma, grade 2, but mostly posttreatment effect. Repeat estrogen and progesterone receptors were again negative. All 11 lymph nodes were benign. Margins were ample.  She was started on chemotherapy, neoadjuvant doxorubicin and cyclophosphamide being given in dose dense fashion with Neulasta support starting 03/17/2013, followed by weekly carboplatin and paclitaxel x12, with final cycle 08/25/2013. She has developed chemotherapy-induced anemia, Status post blood transfusion, 2 units of packed red blood cells, on 06/10/2013 and again on 12-14.   This is followed by radiation therapy, from Feb 2015, completed 12/24/2013   She complains of bilateral feet and hands paresthesia around November 2014, close to the end of her chemotherapy, gradul onset, progressively worse,  She began to use walker in March 2015, despite stopping chemotherapy in December 2014, she continued to get worse, per patient "day by day", she now complains of tingling sensation, burning sensation in her feet, sand paper sensation in her hands, could no longer hold objects. Last time she still was in February 2015 no longer step on  gas pedal, or holding steer wheel.  She denies bowel bladder incontinence, She denies neck, low back pain,  She was started on gabapentin 300 mg qid  since April 2015, which did help her neuropathic pain, 60% improvement, tradamol was helpful too  She continued to have worsening anemia too, decreasing from 10.2 in February 2015 to 9.2.    Recent laboratory evaluation showed normal CMP with exception of decreased albumin 3 point 2, anemia, with hemoglobin 9 point 2, hematocrit 27 point 4 RDW 15 point 2, normal folic acid 9,6 V61 607, ferritin 937.    REVIEW OF SYSTEMS: Full 14 system review of systems performed and notable only for anemia,  allergies, not enough sleep  ALLERGIES: Allergies  Allergen Reactions  . Penicillins Rash    HOME MEDICATIONS: Current Outpatient Prescriptions on File Prior to Visit  Medication Sig Dispense Refill  . Acetaminophen (TYLENOL EXTRA STRENGTH PO) Take 2 tablets by mouth daily as needed (for pain).       Marland Kitchen gabapentin (NEURONTIN) 300 MG capsule Take 1 capsule (300 mg total) by mouth 4 (four) times daily. Meals and bedtime  120 capsule  2  . hyaluronate sodium (RADIAPLEXRX) GEL Apply 1 application topically 2 (two) times daily.      Marland Kitchen LORazepam (ATIVAN) 0.5 MG tablet Take 1 tablet (0.5 mg total) by mouth at bedtime as needed for anxiety or sleep.  30 tablet  0  . Multiple Vitamin (MULTIVITAMIN WITH MINERALS) TABS Take 1 tablet by mouth daily. LIIQUID - Takes 1 cap full daily      . ondansetron (ZOFRAN) 8 MG tablet Take 8 mg by mouth every 8 (eight) hours as needed for nausea.       . pantoprazole (PROTONIX) 40 MG tablet Take 1 tablet (40 mg total) by mouth daily at 12 noon.  30 tablet  2  . prochlorperazine (COMPAZINE) 10 MG tablet Take 10 mg by mouth every 6 (six) hours as needed for nausea or vomiting.      . traMADol (ULTRAM) 50 MG tablet Take 1 tablet (50 mg total) by mouth every 6 (six) hours as needed for moderate pain.  90 tablet  0  . triamterene-hydrochlorothiazide (MAXZIDE-25) 37.5-25 MG per tablet Take 0.5 tablets by mouth daily.         PAST MEDICAL HISTORY: Past Medical History  Diagnosis  Date  . Heart murmur   . Hypertension   . Anemia   . Allergy   . Migraines   . History of blood transfusion     "related to chemo/breast cancer" (09/29/2013)  . GERD (gastroesophageal reflux disease)   . Breast cancer   . Arthritis     "joints" (09/29/2013)  . TMJ (temporomandibular joint syndrome)     "left; just dx'd" (09/29/2013    PAST SURGICAL HISTORY: Past Surgical History  Procedure Laterality Date  . Portacath placement N/A 03/10/2013    Procedure: INSERTION PORT-A-CATH WITH FLUOROSCOPY AND ULTRASOUND;  Surgeon: Adin Hector, MD;  Location: [facility];  Service: General;  Laterality: N/A;  . Axillary lymph node biopsy Right 03/10/2013    Procedure: AXILLARY LYMPH NODE BIOPSY;  Surgeon: Adin Hector, MD;  Location: [facility];  Service: General;  Laterality: Right;  sentinel node with blue dye  . Esophagogastroduodenoscopy N/A 04/28/2013    Procedure: ESOPHAGOGASTRODUODENOSCOPY (EGD);  Surgeon: Wonda Horner, MD;  Location: [facility];  Service: Endoscopy;  Laterality: N/A;  . Colonoscopy N/A 04/30/2013    Procedure: COLONOSCOPY;  Surgeon: Wonda Horner, MD;  Location: [facility];  Service: Endoscopy;  Laterality: N/A;  . Port-a-cath removal  09/29/2013  . Breast lumpectomy Right 09/29/2013    needle localization w/axillary LND/notes 09/29/2013  . Total abdominal hysterectomy      With bilateral salpingo-oophorectomy  . Tonsillectomy    . Mastectomy Right 09/29/2013    "partial"  . Breast lumpectomy with needle localization and axillary lymph node dissection Right 09/29/2013    Procedure: RIGHT BREAST NEEDLE LOCALIZED LUMPECTOMY AND AXILLARY LYMPH NODE DISSECTION;  Surgeon: Adin Hector, MD;  Location: [facility];  Service: General;  Laterality: Right;  . Port-a-cath removal Left 09/29/2013    Procedure: REMOVAL PORT-A-CATH;  Surgeon: Adin Hector, MD;  Location: [facility];  Service: General;  Laterality: Left;    FAMILY HISTORY: Family History  Problem Relation Age of Onset  . Breast  Cancer Mother 64  . Congestive Heart Failure Mother   . Colon cancer Brother   . Diabetes Brother 30  . Heart disease Brother     AMI 05/22/2012  . Congestive Heart Failure Brother   . Breast Cancer Maternal Grandmother 40    SOCIAL HISTORY:  History   Social History  . Marital Status: Divorced    Spouse Name: n/a    Number of Children: 1  . Years of Education: 17   Occupational History  . retired     Sports coach  . subsitute     Social History Main Topics  . Smoking status: Former Smoker -- 1 years    Types: Cigarettes  . Smokeless tobacco: Never Used     Comment: smoked in college 1 yr   . Alcohol Use: No     Comment: Wine occasionally  . Drug Use: No  . Sexual Activity: Not Currently    Partners: Male    Birth  Control/ Protection: Surgical   Other Topics Concern  . Not on file   Social History Narrative   Retired, worked for [employer] system in [employer] in 7/08. Currently work as Oceanographer     PHYSICAL EXAM   Filed Vitals:   01/26/14 0910  BP: 125/80  Pulse: 94  Height: 5\' 4"  (1.626 m)  Weight: 136 lb (61.689 kg)   Body mass index is 23.33 kg/(m^2).   Generalized: In no acute distress  Neck: Supple, no carotid bruits   Cardiac: Regular rate rhythm  Pulmonary: Clear to auscultation bilaterally  Musculoskeletal: No deformity  Neurological examination  Mentation: Alert oriented to time, place, history taking, and causual conversation, tired looking elderly female,  Cranial nerve II-XII: Pupils were equal round reactive to light. Extraocular movements were full.  Visual field were full on confrontational test. Bilateral fundi were sharp.  Facial sensation and strength were normal. Hearing was intact to finger rubbing bilaterally. Uvula tongue midline.  Head turning and shoulder shrug and were normal and symmetric.Tongue protrusion into cheek strength was normal.  Motor: Moderate back and hands intrinsic muscle  atrophy, bilateral shoulder abduction 5-, external rotation4+, elbow flexion4+, elbow extension 4, wrist flexion4-, wrist extension4-, grip4-, hip flexion 4+ knee flexion 4 knee extension 4+,  Ankle dorsi flexion2 plantar flexion2.  Sensory: length dependent decreased  fine touch, pinprick to elbow and midthigh level, absent vibratory sensation too knee, decreased at fingertips,  and absent proprioception at toes, ankles, fingers.  Coordination: Normal finger to nose, heel-to-shin bilaterally there was no truncal ataxia  Gait: need walker to ambulate, wide based, unsteady.   Romberg signs: Negative  Deep tendon reflexes: areflexia. plantar responses were flexor bilaterally.   DIAGNOSTIC DATA (LABS, IMAGING, TESTING) - I reviewed patient records, labs, notes, testing and imaging myself where available.  Lab Results  Component Value Date   WBC 5.5 01/20/2014   HGB 9.2* 01/20/2014   HCT 27.4* 01/20/2014   MCV 100.7 01/20/2014   PLT 264 01/20/2014      Component Value Date/Time   NA 145 01/20/2014 0901   NA 142 09/16/2013 1000   K 3.8 01/20/2014 0901   K 3.4* 09/16/2013 1000   CL 101 09/16/2013 1000   CL 100 03/13/2013 0918   CO2 26 01/20/2014 0901   CO2 27 09/16/2013 1000   GLUCOSE 106 01/20/2014 0901   GLUCOSE 103* 09/16/2013 1000   GLUCOSE 169* 03/13/2013 0918   BUN 11.6 01/20/2014 0901   BUN 13 09/16/2013 1000   CREATININE 0.8 01/20/2014 0901   CREATININE 0.94 09/16/2013 1000   CREATININE 0.94 11/29/2011 1535   CALCIUM 9.8 01/20/2014 0901   CALCIUM 9.6 09/16/2013 1000   PROT 7.4 01/20/2014 0901   PROT 7.2 09/16/2013 1000   ALBUMIN 3.2* 01/20/2014 0901   ALBUMIN 3.6 09/16/2013 1000   AST 34 01/20/2014 0901   AST 20 09/16/2013 1000   ALT 35 01/20/2014 0901   ALT 21 09/16/2013 1000   ALKPHOS 101 01/20/2014 0901   ALKPHOS 89 09/16/2013 1000   BILITOT 0.28 01/20/2014 0901   BILITOT 0.3 09/16/2013 1000   GFRNONAA 62* 09/16/2013 1000   GFRNONAA 64 11/29/2011 1535   GFRAA 72* 09/16/2013  1000   GFRAA 74 11/29/2011 1535   Lab Results  Component Value Date   CHOL 164 11/29/2011   HDL 43 11/29/2011   LDLCALC 87 11/29/2011   TRIG 170* 11/29/2011   CHOLHDL 3.8 11/29/2011   Lab Results  Component Value Date  HGBA1C 7.1* 04/27/2013   Lab Results  Component Value Date   VITAMINB12 623 01/20/2014   Lab Results  Component Value Date   TSH 2.422 04/27/2013      ASSESSMENT AND PLAN  LETONYA MANGELS is a 68 y.o. female  with history of breast cancer, status post lobectomy in May 2014, followed by chemotherapy, radiation therapy, now with progressive worsening gait difficulty, distal arm and leg  muscle atrophy, profound weakness, length dependent sensory changes, areflexia.  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  1, EMG nerve conduction study 2. laboratory evaluation for treatable etiology 3. Lumbar puncture, check TP, cytology 4. MRI of neural axis 5 increase her gabapentin to 300 mg 2 tablets 3 times a day.       Marcial Pacas, M.D. Ph.D.  [facility] [address] [place], [place] [address] [phone]

## 2014-01-28 ENCOUNTER — Telehealth: Payer: Self-pay | Admitting: Neurology

## 2014-01-28 ENCOUNTER — Telehealth: Payer: Self-pay | Admitting: Oncology

## 2014-01-28 NOTE — Telephone Encounter (Signed)
, °

## 2014-01-28 NOTE — Telephone Encounter (Signed)
Butch Penny: Please call patient,, laboratory showed elevated TSH, low T4, indicating hypothyroidism,   I have faxed the result to her primary care physician, she needs continue thyroid workup with her primary care physician,

## 2014-01-29 ENCOUNTER — Ambulatory Visit: Payer: Medicare Other | Admitting: Radiation Oncology

## 2014-01-29 LAB — COPPER, SERUM: Copper: 151 ug/dL (ref 72–166)

## 2014-01-29 LAB — LYME, TOTAL AB TEST/REFLEX

## 2014-01-29 LAB — THYROID PANEL WITH TSH
Free Thyroxine Index: 1.1 — ABNORMAL LOW (ref 1.2–4.9)
T3 Uptake Ratio: 26 % (ref 24–39)
T4, Total: 4.3 ug/dL — ABNORMAL LOW (ref 4.5–12.0)
TSH: 15.96 u[IU]/mL — ABNORMAL HIGH (ref 0.450–4.500)

## 2014-01-29 LAB — IFE AND PE, SERUM
Albumin SerPl Elph-Mcnc: 3.4 g/dL (ref 3.2–5.6)
Albumin/Glob SerPl: 0.9 (ref 0.7–2.0)
Alpha 1: 0.3 g/dL (ref 0.1–0.4)
Alpha2 Glob SerPl Elph-Mcnc: 1.1 g/dL (ref 0.4–1.2)
B-Globulin SerPl Elph-Mcnc: 1.2 g/dL (ref 0.6–1.3)
GLOBULIN, TOTAL: 4 g/dL (ref 2.0–4.5)
Gamma Glob SerPl Elph-Mcnc: 1.4 g/dL (ref 0.5–1.6)
IGG (IMMUNOGLOBIN G), SERUM: 1545 mg/dL (ref 700–1600)
IGM (IMMUNOGLOBULIN M), SRM: 31 mg/dL — AB (ref 40–230)
IgA/Immunoglobulin A, Serum: 301 mg/dL (ref 91–414)
TOTAL PROTEIN: 7.4 g/dL (ref 6.0–8.5)

## 2014-01-29 LAB — SEDIMENTATION RATE: Sed Rate: 74 mm/hr — ABNORMAL HIGH (ref 0–40)

## 2014-01-29 LAB — C-REACTIVE PROTEIN: CRP: 8.2 mg/L — ABNORMAL HIGH (ref 0.0–4.9)

## 2014-01-29 LAB — VITAMIN B1, WHOLE BLOOD: Thiamine: 246.5 nmol/L — ABNORMAL HIGH (ref 66.5–200.0)

## 2014-01-29 LAB — METHYLMALONIC ACID, SERUM: METHYLMALONIC ACID: 947 nmol/L — AB (ref 0–378)

## 2014-01-29 LAB — HIV ANTIBODY (ROUTINE TESTING W REFLEX): HIV-1/HIV-2 Ab: NONREACTIVE

## 2014-01-29 LAB — RPR: RPR: NONREACTIVE

## 2014-01-29 LAB — ANA W/REFLEX IF POSITIVE: Anti Nuclear Antibody(ANA): NEGATIVE

## 2014-01-29 LAB — CK: CK TOTAL: 369 U/L — AB (ref 24–173)

## 2014-01-30 NOTE — Telephone Encounter (Signed)
I have called Tabitha Ramirez, failed to reach her, lab showed hypothyroid, she will contact her pcp Dr. Perlie Gold for further treatment.

## 2014-01-30 NOTE — Telephone Encounter (Signed)
Spoke to patient and relayed lab result showing hypothyroidism, per Dr. Krista Blue.  Explained to patient that her PCP should treat her.  She said she will ask Dr. Starleen Arms office to treat.  I told her all labs are visible in the system.

## 2014-02-04 ENCOUNTER — Other Ambulatory Visit: Payer: Self-pay | Admitting: Oncology

## 2014-02-05 ENCOUNTER — Encounter (INDEPENDENT_AMBULATORY_CARE_PROVIDER_SITE_OTHER): Payer: Self-pay | Admitting: Radiology

## 2014-02-05 ENCOUNTER — Ambulatory Visit (INDEPENDENT_AMBULATORY_CARE_PROVIDER_SITE_OTHER): Payer: Medicare Other | Admitting: Neurology

## 2014-02-05 DIAGNOSIS — R269 Unspecified abnormalities of gait and mobility: Secondary | ICD-10-CM

## 2014-02-05 DIAGNOSIS — I10 Essential (primary) hypertension: Secondary | ICD-10-CM

## 2014-02-05 DIAGNOSIS — Z0289 Encounter for other administrative examinations: Secondary | ICD-10-CM

## 2014-02-05 DIAGNOSIS — C50119 Malignant neoplasm of central portion of unspecified female breast: Secondary | ICD-10-CM

## 2014-02-05 DIAGNOSIS — R011 Cardiac murmur, unspecified: Secondary | ICD-10-CM

## 2014-02-05 DIAGNOSIS — G609 Hereditary and idiopathic neuropathy, unspecified: Secondary | ICD-10-CM

## 2014-02-05 NOTE — Procedures (Signed)
   NCS (NERVE CONDUCTION STUDY) WITH EMG (ELECTROMYOGRAPHY) REPORT   STUDY DATE: Feb 05 2014 PATIENT NAME: Tabitha Ramirez DOB: 1945-11-20 MRN: [id number]    TECHNOLOGIST: Towana Badger ELECTROMYOGRAPHER: Marcial Pacas M.D.  CLINICAL INFORMATION:  68 year old female, with past medical history of breast cancer, status post right lobectomy, followed by chemotherapy, with doxorubicin cyclophosphamide, carboplatin, paclitaxel, completed in December 2014, she has developed bilateral feet and hands paresthesia around November 2014, progressively worse, also developed distal leg weakness, gait difficulty.  FINDINGS: NERVE CONDUCTION STUDY: Bilateral peroneal, sural, median, ulnar sensory responses were absent Bilateral ulnar motor responses were absent  Right median motor response showed prolonged distal latency, severely decreased C. map amplitude, absent F wave latency, slow conduction velocity 34 meter per milliseconds. Left median motor responses showed mildly prolonged distal latency, mildly decreased C. map amplitude, slow conduction velocity, moderately prolonged F-wave latency  NEEDLE ELECTROMYOGRAPHY: Selected needle examination was performed at left upper, left lower extremity muscles, left lumbosacral paraspinals  Left tibialis anterior, tibialis posterior, medial gastrocnemius, increased insertion activity, 3 plus spontaneous activity, significantly decreased in number of motor unit potential, with decreased recruitment  Patterns  Left vastus lateralis, increased insertion activity, no spontaneous activity, complex enlarged motor unit potential, with decreased recruitment patterns  I do not see any spontaneous activity at left lumbosacral paraspinals,  Left extensor digitorum communis, first also interossei, increased insertion activity, 1-3 plus spontaneous activity, complex enlarged motor unit potential, with decreased recruitment patterns.  Left biceps, deltoid, increased insertion  activity, no spontaneous activity, mixture of normal, some enlarged motor unit potential, with mildly decreased recruitment patterns.   IMPRESSION:   This is an abnormal study. There is electrodiagnostic evidence of severe peripheral neuropathy, with mainly axonal features, there is some mixed demyelinating features, as evidenced by severely prolonged F-wave latency, or absent late response, slow conduction velocity. Further evaluations looking for possible inflammatory process is planned,   INTERPRETING PHYSICIAN:   Marcial Pacas M.D. Ph.D. [facility] [address] [place], [place] [address] [phone]

## 2014-02-05 NOTE — Progress Notes (Signed)
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 Her primary care physician is Dr. Willis Modena her oncologist is Dr. Jana Hakim  She was diagnosed with clinical T2 NX, stage II invasive ductal carcinoma, grade 3, triple negative, with an MIB-1 of 84% , underwent right lumpectomy and right axillary lymph node dissection on 09/29/2013. The pathology from that procedure ([id number]) showed an area of approximately 1.1 cm showing residual invasive nests and cells of ductal carcinoma, grade 2, but mostly posttreatment effect. Repeat estrogen and progesterone receptors were again negative. All 11 lymph nodes were benign. Margins were ample.  She was started on chemotherapy, neoadjuvant doxorubicin and cyclophosphamide being given in dose dense fashion with Neulasta support starting 03/17/2013, followed by weekly carboplatin and paclitaxel x12, with final cycle 08/25/2013. She has developed chemotherapy-induced anemia, Status post blood transfusion, 2 units of packed red blood cells, on 06/10/2013 and again on 12-14.   This is followed by radiation therapy, from Feb 2015, completed 12/24/2013   She complains of bilateral feet and hands paresthesia around November 2014, close to the end of her chemotherapy, gradul onset, progressively worse,  She began to use walker in March 2015, despite stopping chemotherapy in December 2014, she continued to get worse, per patient "day by day", she now complains of tingling sensation, burning sensation in her feet, sand paper sensation in her hands, could no longer hold objects. Last time she still was in February 2015 no longer step on  gas pedal, or holding steer wheel.  She denies bowel bladder incontinence, She denies neck, low back pain,  She was started on gabapentin 300 mg qid  since April 2015, which did help her neuropathic pain, 60% improvement, tradamol was helpful too  She continued to have worsening anemia too, decreasing from 10.2 in February 2015 to 9.2.    Recent laboratory evaluation showed normal CMP with exception of decreased albumin 3 point 2, anemia, with hemoglobin 9 point 2, hematocrit 27 point 4 RDW 15 point 2, normal folic acid 9,6 V61 607, ferritin 937.    REVIEW OF SYSTEMS: Full 14 system review of systems performed and notable only for anemia,  allergies, not enough sleep  ALLERGIES: Allergies  Allergen Reactions  . Penicillins Rash    HOME MEDICATIONS: Current Outpatient Prescriptions on File Prior to Visit  Medication Sig Dispense Refill  . Acetaminophen (TYLENOL EXTRA STRENGTH PO) Take 2 tablets by mouth daily as needed (for pain).       Marland Kitchen gabapentin (NEURONTIN) 300 MG capsule Take 1 capsule (300 mg total) by mouth 4 (four) times daily. Meals and bedtime  120 capsule  2  . hyaluronate sodium (RADIAPLEXRX) GEL Apply 1 application topically 2 (two) times daily.      Marland Kitchen LORazepam (ATIVAN) 0.5 MG tablet Take 1 tablet (0.5 mg total) by mouth at bedtime as needed for anxiety or sleep.  30 tablet  0  . Multiple Vitamin (MULTIVITAMIN WITH MINERALS) TABS Take 1 tablet by mouth daily. LIIQUID - Takes 1 cap full daily      . ondansetron (ZOFRAN) 8 MG tablet Take 8 mg by mouth every 8 (eight) hours as needed for nausea.       . pantoprazole (PROTONIX) 40 MG tablet Take 1 tablet (40 mg total) by mouth daily at 12 noon.  30 tablet  2  . prochlorperazine (COMPAZINE) 10 MG tablet Take 10 mg by mouth every 6 (six) hours as needed for nausea or vomiting.      . traMADol (ULTRAM) 50 MG tablet Take 1 tablet (50 mg total) by mouth every 6 (six) hours as needed for moderate pain.  90 tablet  0  . triamterene-hydrochlorothiazide (MAXZIDE-25) 37.5-25 MG per tablet Take 0.5 tablets by mouth daily.         PAST MEDICAL HISTORY: Past Medical History  Diagnosis  Date  . Heart murmur   . Hypertension   . Anemia   . Allergy   . Migraines   . History of blood transfusion     "related to chemo/breast cancer" (09/29/2013)  . GERD (gastroesophageal reflux disease)   . Breast cancer   . Arthritis     "joints" (09/29/2013)  . TMJ (temporomandibular joint syndrome)     "left; just dx'd" (09/29/2013    PAST SURGICAL HISTORY: Past Surgical History  Procedure Laterality Date  . Portacath placement N/A 03/10/2013    Procedure: INSERTION PORT-A-CATH WITH FLUOROSCOPY AND ULTRASOUND;  Surgeon: Adin Hector, MD;  Location: [facility];  Service: General;  Laterality: N/A;  . Axillary lymph node biopsy Right 03/10/2013    Procedure: AXILLARY LYMPH NODE BIOPSY;  Surgeon: Adin Hector, MD;  Location: [facility];  Service: General;  Laterality: Right;  sentinel node with blue dye  . Esophagogastroduodenoscopy N/A 04/28/2013    Procedure: ESOPHAGOGASTRODUODENOSCOPY (EGD);  Surgeon: Wonda Horner, MD;  Location: [facility];  Service: Endoscopy;  Laterality: N/A;  . Colonoscopy N/A 04/30/2013    Procedure: COLONOSCOPY;  Surgeon: Wonda Horner, MD;  Location: [facility];  Service: Endoscopy;  Laterality: N/A;  . Port-a-cath removal  09/29/2013  . Breast lumpectomy Right 09/29/2013    needle localization w/axillary LND/notes 09/29/2013  . Total abdominal hysterectomy      With bilateral salpingo-oophorectomy  . Tonsillectomy    . Mastectomy Right 09/29/2013    "partial"  . Breast lumpectomy with needle localization and axillary lymph node dissection Right 09/29/2013    Procedure: RIGHT BREAST NEEDLE LOCALIZED LUMPECTOMY AND AXILLARY LYMPH NODE DISSECTION;  Surgeon: Adin Hector, MD;  Location: [facility];  Service: General;  Laterality: Right;  . Port-a-cath removal Left 09/29/2013    Procedure: REMOVAL PORT-A-CATH;  Surgeon: Adin Hector, MD;  Location: [facility];  Service: General;  Laterality: Left;    FAMILY HISTORY: Family History  Problem Relation Age of Onset  . Breast  Cancer Mother 61  . Congestive Heart Failure Mother   . Colon cancer Brother   . Diabetes Brother 58  . Heart disease Brother     AMI 05/22/2012  . Congestive Heart Failure Brother   . Breast Cancer Maternal Grandmother 44    SOCIAL HISTORY:  History   Social History  . Marital Status: Divorced    Spouse Name: n/a    Number of Children: 1  . Years of Education: 17   Occupational History  . retired     Sports coach  . subsitute     Social History Main Topics  . Smoking status: Former Smoker -- 1 years    Types: Cigarettes  . Smokeless tobacco: Never Used     Comment: smoked in college 1 yr   . Alcohol Use: No     Comment: Wine occasionally  . Drug Use: No  . Sexual Activity: Not Currently    Partners: Male    Birth  Control/ Protection: Surgical   Other Topics Concern  . Not on file   Social History Narrative   Retired, worked for [employer] system in [employer] in 7/08. Currently work as Oceanographer     PHYSICAL EXAM   There were no vitals filed for this visit. There is no weight on file to calculate BMI.   Generalized: In no acute distress  Neck: Supple, no carotid bruits   Cardiac: Regular rate rhythm  Pulmonary: Clear to auscultation bilaterally  Musculoskeletal: No deformity  Neurological examination  Mentation: Alert oriented to time, place, history taking, and causual conversation, tired looking elderly female,  Cranial nerve II-XII: Pupils were equal round reactive to light. Extraocular movements were full.  Visual field were full on confrontational test. Bilateral fundi were sharp.  Facial sensation and strength were normal. Hearing was intact to finger rubbing bilaterally. Uvula tongue midline.  Head turning and shoulder shrug and were normal and symmetric.Tongue protrusion into cheek strength was normal.  Motor: Moderate back and hands intrinsic muscle atrophy, bilateral shoulder abduction 5-, external rotation4+,  elbow flexion4+, elbow extension 4, wrist flexion4-, wrist extension4-, grip4-, hip flexion 4+ knee flexion 4 knee extension 4+,  Ankle dorsi flexion2 plantar flexion2.  Sensory: length dependent decreased  fine touch, pinprick to elbow and midthigh level, absent vibratory sensation too knee, decreased at fingertips,  and absent proprioception at toes, ankles, fingers.  Coordination: Normal finger to nose, heel-to-shin bilaterally there was no truncal ataxia  Gait: need walker to ambulate, wide based, unsteady.   Romberg signs: Negative  Deep tendon reflexes: areflexia. plantar responses were flexor bilaterally.   DIAGNOSTIC DATA (LABS, IMAGING, TESTING) - I reviewed patient records, labs, notes, testing and imaging myself where available.  Lab Results  Component Value Date   WBC 5.5 01/20/2014   HGB 9.2* 01/20/2014   HCT 27.4* 01/20/2014   MCV 100.7 01/20/2014   PLT 264 01/20/2014      Component Value Date/Time   NA 145 01/20/2014 0901   NA 142 09/16/2013 1000   K 3.8 01/20/2014 0901   K 3.4* 09/16/2013 1000   CL 101 09/16/2013 1000   CL 100 03/13/2013 0918   CO2 26 01/20/2014 0901   CO2 27 09/16/2013 1000   GLUCOSE 106 01/20/2014 0901   GLUCOSE 103* 09/16/2013 1000   GLUCOSE 169* 03/13/2013 0918   BUN 11.6 01/20/2014 0901   BUN 13 09/16/2013 1000   CREATININE 0.8 01/20/2014 0901   CREATININE 0.94 09/16/2013 1000   CREATININE 0.94 11/29/2011 1535   CALCIUM 9.8 01/20/2014 0901   CALCIUM 9.6 09/16/2013 1000   PROT 7.4 01/26/2014 1057   PROT 7.4 01/20/2014 0901   PROT 7.2 09/16/2013 1000   ALBUMIN 3.2* 01/20/2014 0901   ALBUMIN 3.6 09/16/2013 1000   AST 34 01/20/2014 0901   AST 20 09/16/2013 1000   ALT 35 01/20/2014 0901   ALT 21 09/16/2013 1000   ALKPHOS 101 01/20/2014 0901   ALKPHOS 89 09/16/2013 1000   BILITOT 0.28 01/20/2014 0901   BILITOT 0.3 09/16/2013 1000   GFRNONAA 62* 09/16/2013 1000   GFRNONAA 64 11/29/2011 1535   GFRAA 72* 09/16/2013 1000   GFRAA 74 11/29/2011 1535   Lab  Results  Component Value Date   CHOL 164 11/29/2011   HDL 43 11/29/2011   LDLCALC 87 11/29/2011   TRIG 170* 11/29/2011   CHOLHDL 3.8 11/29/2011   Lab Results  Component Value Date   HGBA1C 7.1* 04/27/2013   Lab Results  Component Value Date   VITAMINB12 623 01/20/2014   Lab Results  Component Value Date   TSH 15.960* 01/26/2014      ASSESSMENT AND PLAN  MURDIS HOLEMAN is a 68 y.o. female  with history of breast cancer, status post lobectomy in May 2014, followed by chemotherapy, radiation therapy, now with progressive worsening gait difficulty, distal arm and leg  muscle atrophy, profound weakness, length dependent sensory changes, areflexia.  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  1, EMG nerve conduction study 2. laboratory evaluation for treatable etiology 3. Lumbar puncture, check TP, cytology 4. MRI of neural axis 5 increase her gabapentin to 300 mg 2 tablets 3 times a day.       Marcial Pacas, M.D. Ph.D.  [facility] [address] [place], [place] [address] [phone]

## 2014-02-10 ENCOUNTER — Other Ambulatory Visit: Payer: Medicare Other

## 2014-02-10 ENCOUNTER — Other Ambulatory Visit: Payer: Self-pay | Admitting: *Deleted

## 2014-02-10 ENCOUNTER — Ambulatory Visit
Admission: RE | Admit: 2014-02-10 | Discharge: 2014-02-10 | Disposition: A | Discharge status: Home / Self Care | Payer: 59 | Source: Ambulatory Visit | Attending: Neurology | Admitting: Neurology

## 2014-02-10 ENCOUNTER — Telehealth: Payer: Self-pay | Admitting: Neurology

## 2014-02-10 ENCOUNTER — Other Ambulatory Visit (HOSPITAL_COMMUNITY)
Admission: RE | Admit: 2014-02-10 | Discharge: 2014-02-10 | Disposition: A | Discharge status: Home / Self Care | Payer: 59 | Source: Ambulatory Visit | Attending: Neurology | Admitting: Neurology

## 2014-02-10 VITALS — BP 144/80 | HR 79

## 2014-02-10 DIAGNOSIS — R269 Unspecified abnormalities of gait and mobility: Secondary | ICD-10-CM

## 2014-02-10 DIAGNOSIS — G609 Hereditary and idiopathic neuropathy, unspecified: Secondary | ICD-10-CM

## 2014-02-10 DIAGNOSIS — C50119 Malignant neoplasm of central portion of unspecified female breast: Secondary | ICD-10-CM

## 2014-02-10 DIAGNOSIS — K219 Gastro-esophageal reflux disease without esophagitis: Secondary | ICD-10-CM

## 2014-02-10 LAB — GRAM STAIN: GRAM STAIN: NONE SEEN

## 2014-02-10 MED ORDER — GADOBENATE DIMEGLUMINE 529 MG/ML IV SOLN
12.0000 mL | Freq: Once | INTRAVENOUS | Status: AC | PRN
  Administered 2014-02-10: 12 mL via INTRAVENOUS

## 2014-02-10 MED ORDER — PANTOPRAZOLE SODIUM 40 MG PO TBEC: 40.0000 mg | DELAYED_RELEASE_TABLET | Freq: Every day | ORAL | Status: DC

## 2014-02-10 NOTE — Telephone Encounter (Signed)
CSF study reviewed., WBC 0, RBC 0, protein 125, significantly elevated, glucose 56, cytologies pending  I will discuss with her oncologist Dr. Jana Hakim for Ivig treatment, follow up in June 15th 2015

## 2014-02-10 NOTE — Discharge Instructions (Signed)

## 2014-02-10 NOTE — Progress Notes (Signed)
Discharge instructions explained to pt. Written instructions to be given to pt. upon discharge with daughter.

## 2014-02-11 ENCOUNTER — Ambulatory Visit: Payer: Medicare Other | Admitting: Radiation Oncology

## 2014-02-11 ENCOUNTER — Telehealth: Payer: Self-pay | Admitting: Neurology

## 2014-02-11 LAB — CSF PANEL 1
Glucose, CSF: 56 mg/dL (ref 43–76)
RBC COUNT CSF: 0 uL
SYPHILIS VDRL QUANT CSF: NONREACTIVE
TOTAL PROTEIN, CSF: 125 mg/dL — AB (ref 15–45)
Tube #: 4
WBC CSF: 0 uL (ref 0–5)

## 2014-02-11 NOTE — Telephone Encounter (Signed)
Angel with Dr. Carrie Mew office wanted to inform Dr Krista Blue, patient doesn't want to schedule appointment at this time.  Thanks

## 2014-02-12 ENCOUNTER — Other Ambulatory Visit: Payer: Self-pay | Admitting: Oncology

## 2014-02-12 ENCOUNTER — Ambulatory Visit
Admission: RE | Admit: 2014-02-12 | Discharge: 2014-02-12 | Disposition: A | Discharge status: Home / Self Care | Payer: Medicare Other | Source: Ambulatory Visit | Attending: Radiation Oncology | Admitting: Radiation Oncology

## 2014-02-12 VITALS — BP 113/67 | HR 86 | Temp 98.2°F | Wt 139.0 lb

## 2014-02-12 DIAGNOSIS — C50119 Malignant neoplasm of central portion of unspecified female breast: Secondary | ICD-10-CM

## 2014-02-12 NOTE — Progress Notes (Signed)
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

## 2014-02-12 NOTE — Telephone Encounter (Signed)
View will let Dr.Yan no.

## 2014-02-12 NOTE — Telephone Encounter (Signed)
I failed to reach her, I have called her daughter, explained, peroneal nerve biopsy is to further clarify the diagnosis, spinal fluid testing showed elevated CSF protein 125, indicating a demyelinating features.  Her daughter will contact Dr. Donnella Bi office, for nerve biopsy, she is to continue follow up appointment in June 2015

## 2014-02-13 ENCOUNTER — Other Ambulatory Visit: Payer: Self-pay | Admitting: *Deleted

## 2014-02-13 DIAGNOSIS — I10 Essential (primary) hypertension: Secondary | ICD-10-CM

## 2014-02-13 MED ORDER — TRIAMTERENE-HCTZ 37.5-25 MG PO TABS: 0.5000 | ORAL_TABLET | Freq: Every day | ORAL | Status: DC

## 2014-02-13 NOTE — Progress Notes (Signed)
   Department of Radiation Oncology  Phone:  [phone] Fax:        [phone]   Name: Tabitha Ramirez MRN: [id number]  DOB: 08-18-1946  Date: 02/12/2014  Follow Up Visit Note  Diagnosis: T2N1 Right breast cancer  Summary and Interval since last radiation: 6 weeks from RT to the right breast and supraclvicular fossa to a total dose of 61 Gy completed 12/24/13  Interval History: Tabitha Ramirez presents today for routine followup.  She is still struggling with neuropathy which might be slightly better. She has been evaluated extensively by neurology. Surgery has been mentioned although she is not excited about that option. She was triple negative so she is not on antiestrogen treatment.   Allergies:  Allergies  Allergen Reactions  . Penicillins Rash    Medications:  Current Outpatient Prescriptions  Medication Sig Dispense Refill  . Acetaminophen (TYLENOL EXTRA STRENGTH PO) Take 2 tablets by mouth daily as needed (for pain).       Marland Kitchen gabapentin (NEURONTIN) 300 MG capsule Take 2 capsules (600 mg total) by mouth 3 (three) times daily. Meals and bedtime  180 capsule  12  . LORazepam (ATIVAN) 0.5 MG tablet Take 1 tablet (0.5 mg total) by mouth at bedtime as needed for anxiety or sleep.  30 tablet  0  . Multiple Vitamin (MULTIVITAMIN WITH MINERALS) TABS Take 1 tablet by mouth daily. LIIQUID - Takes 1 cap full daily      . ondansetron (ZOFRAN) 8 MG tablet Take 8 mg by mouth every 8 (eight) hours as needed for nausea.       . pantoprazole (PROTONIX) 40 MG tablet Take 1 tablet (40 mg total) by mouth daily at 12 noon.  30 tablet  0  . traMADol (ULTRAM) 50 MG tablet Take 1 tablet (50 mg total) by mouth every 6 (six) hours as needed for moderate pain.  90 tablet  0  . prochlorperazine (COMPAZINE) 10 MG tablet Take 10 mg by mouth every 6 (six) hours as needed for nausea or vomiting.      . triamterene-hydrochlorothiazide (MAXZIDE-25) 37.5-25 MG per tablet Take 0.5 tablets by mouth daily.       No  current facility-administered medications for this encounter.    Physical Exam:  Filed Vitals:   02/12/14 1448  BP: 113/67  Pulse: 86  Temp: 98.2 F (36.8 C)  Weight: 139 lb (63.05 kg)  SpO2: 100%   She is able to undo her gown on her own which she previously could not do. She has minimal hyperpigmentation of the breast along the scar. There eis no nipple.   IMPRESSION: Tabitha Ramirez is a 68 y.o. female s/p breast conservation with resolving acute effects of radiation  PLAN:  She is doing well. We discussed the need for follow up every 4-6 months which she has scheduled.  We discussed the need for yearly mammograms which she can schedule with her OBGYN or with medical oncology. We discussed the need for sun protection in the treated area.  She will continue follow up with neurology for her neuropathy. She can always call me with questions.  I will follow up with her on an as needed basis.      Thea Silversmith, MD

## 2014-02-13 NOTE — Telephone Encounter (Signed)
I called Tabitha Ramirez to clarify the dose as we had previously prescribed 1 tablet daily rather than 1/2 tablet daily.  She states the dose was changed at the cancer center secondary to low blood pressure.  I therefore prescribed the 0.5 mg dose with no refills and encouraged her to make the scheduled appointment in our clinic next week for reassessment.  Further changes in the dose could be made based upon her blood pressure, potassium, and kidney function during that visit.

## 2014-02-13 NOTE — Telephone Encounter (Signed)
Pt has appt 5/29 dr Eyvonne Mechanic, pt has not been to clinic due to treatment for breast cancer, she states she has continued to take her BP med without problem.

## 2014-02-13 NOTE — Telephone Encounter (Signed)
Will follow up at her next office visit with me.

## 2014-02-14 ENCOUNTER — Ambulatory Visit
Admission: RE | Admit: 2014-02-14 | Discharge: 2014-02-14 | Disposition: A | Discharge status: Home / Self Care | Payer: Medicare Other | Source: Ambulatory Visit | Attending: Neurology | Admitting: Neurology

## 2014-02-14 DIAGNOSIS — C50119 Malignant neoplasm of central portion of unspecified female breast: Secondary | ICD-10-CM

## 2014-02-14 DIAGNOSIS — R269 Unspecified abnormalities of gait and mobility: Secondary | ICD-10-CM

## 2014-02-14 DIAGNOSIS — G609 Hereditary and idiopathic neuropathy, unspecified: Secondary | ICD-10-CM

## 2014-02-14 MED ORDER — GADOBENATE DIMEGLUMINE 529 MG/ML IV SOLN
12.0000 mL | Freq: Once | INTRAVENOUS | Status: AC | PRN
  Administered 2014-02-14: 12 mL via INTRAVENOUS

## 2014-02-18 ENCOUNTER — Telehealth: Payer: Self-pay | Admitting: *Deleted

## 2014-02-18 NOTE — Telephone Encounter (Signed)
Please let her keep follow up appt in June, I will exam her and go over test result with her,

## 2014-02-18 NOTE — Telephone Encounter (Signed)
Pt called to cancel her appt with Dr. Krista Blue because the test that Dr. Krista Blue wanted her to have at [facility] is on 04/13/14, pt stated that they could not get her in sooner. Pt states that she is seeing Dr. Sherwood Gambler and that she will call as soon as she has the test to resch the appt with Dr. Krista Blue. I advised the pt that if she has any other problems, questions or concerns to call the office. Pt verbalized understanding. FYI

## 2014-02-19 ENCOUNTER — Ambulatory Visit (INDEPENDENT_AMBULATORY_CARE_PROVIDER_SITE_OTHER): Payer: Medicare Other | Admitting: General Surgery

## 2014-02-19 NOTE — Telephone Encounter (Signed)
Called pt to inform her per Dr. Krista Blue that the doctor would like for the pt to keep f/u appt in June and that the doctor would go over test results with her. I advised the pt that if she has any other problems, questions or concerns to call the office. Pt verbalized understanding.

## 2014-02-20 ENCOUNTER — Ambulatory Visit (INDEPENDENT_AMBULATORY_CARE_PROVIDER_SITE_OTHER): Payer: 59 | Admitting: Internal Medicine

## 2014-02-20 ENCOUNTER — Encounter: Payer: Self-pay | Admitting: Internal Medicine

## 2014-02-20 VITALS — BP 110/65 | HR 80 | Temp 98.9°F | Ht 64.0 in | Wt 140.0 lb

## 2014-02-20 DIAGNOSIS — I10 Essential (primary) hypertension: Secondary | ICD-10-CM

## 2014-02-20 DIAGNOSIS — Z853 Personal history of malignant neoplasm of breast: Secondary | ICD-10-CM

## 2014-02-20 DIAGNOSIS — G608 Other hereditary and idiopathic neuropathies: Secondary | ICD-10-CM

## 2014-02-20 DIAGNOSIS — C50119 Malignant neoplasm of central portion of unspecified female breast: Secondary | ICD-10-CM

## 2014-02-20 DIAGNOSIS — G609 Hereditary and idiopathic neuropathy, unspecified: Secondary | ICD-10-CM

## 2014-02-20 MED ORDER — TRIAMTERENE-HCTZ 37.5-25 MG PO TABS: 0.5000 | ORAL_TABLET | Freq: Every day | ORAL | Status: DC

## 2014-02-20 NOTE — Assessment & Plan Note (Signed)
Management per Neurology.

## 2014-02-20 NOTE — Progress Notes (Signed)
Subjective:   Patient ID: PAVIELLE BIGGAR female   DOB: 1945/11/18 68 y.o.   MRN: [id number]  HPI: Ms.Latisha D Munro is a 68 y.o. woman with PMH significant for HTN, Breast cancer Stage IIB, finished her CT and RT couple of months ago comes to the office for a refill of her BP medication.  Patient reports that she called the clinic for a refill of her BP medication and was told to come to be seen as it has been more than a year. Patient is here in the office along with her daughter.   Patient states that she finished her RT in April and CT in feb/march. Patient developed parasthesias post chemotherapy and is followed by Dr. Krista Blue.   She denies any complaints during this visit.  Past Medical History  Diagnosis Date  . Heart murmur   . Hypertension   . Anemia   . Allergy   . Migraines   . History of blood transfusion     "related to chemo/breast cancer" (09/29/2013)  . GERD (gastroesophageal reflux disease)   . Breast cancer   . Arthritis     "joints" (09/29/2013)  . TMJ (temporomandibular joint syndrome)     "left; just dx'd" (09/29/2013  . Radiation 11/05/13-12/24/13    Right breast    Current Outpatient Prescriptions  Medication Sig Dispense Refill  . Acetaminophen (TYLENOL EXTRA STRENGTH PO) Take 2 tablets by mouth daily as needed (for pain).       Marland Kitchen gabapentin (NEURONTIN) 300 MG capsule Take 2 capsules (600 mg total) by mouth 3 (three) times daily. Meals and bedtime  180 capsule  12  . LORazepam (ATIVAN) 0.5 MG tablet Take 1 tablet (0.5 mg total) by mouth at bedtime as needed for anxiety or sleep.  30 tablet  0  . Multiple Vitamin (MULTIVITAMIN WITH MINERALS) TABS Take 1 tablet by mouth daily. LIIQUID - Takes 1 cap full daily      . ondansetron (ZOFRAN) 8 MG tablet Take 8 mg by mouth every 8 (eight) hours as needed for nausea.       . pantoprazole (PROTONIX) 40 MG tablet Take 1 tablet (40 mg total) by mouth daily at 12 noon.  30 tablet  0  . prochlorperazine (COMPAZINE) 10 MG tablet  Take 10 mg by mouth every 6 (six) hours as needed for nausea or vomiting.      . traMADol (ULTRAM) 50 MG tablet Take 1 tablet (50 mg total) by mouth every 6 (six) hours as needed for moderate pain.  90 tablet  0  . triamterene-hydrochlorothiazide (MAXZIDE-25) 37.5-25 MG per tablet Take 0.5 tablets by mouth daily.  30 tablet  11   No current facility-administered medications for this visit.   Family History  Problem Relation Age of Onset  . Cancer Mother 44    Unknown type of cancer  . Congestive Heart Failure Mother   . Colon cancer Brother   . Diabetes Brother 28  . Heart disease Brother     AMI 05/22/2012  . Congestive Heart Failure Brother   . Cancer Maternal Grandmother 75    Unknown type of cancer   History   Social History  . Marital Status: Divorced    Spouse Name: n/a    Number of Children: 1  . Years of Education: 17   Occupational History  . retired     Sports coach  . subsitute     Social History Main Topics  . Smoking status: Former  Smoker -- 1 years    Types: Cigarettes  . Smokeless tobacco: Never Used     Comment: smoked in college 1 yr   . Alcohol Use: No     Comment: Wine occasionally  . Drug Use: No  . Sexual Activity: Not Currently    Partners: Male    Birth Control/ Protection: Surgical   Other Topics Concern  . None   Social History Narrative   Retired, worked for [employer] system in [employer] in 7/08. Currently work as Oceanographer   Review of Systems: Pertinent items are noted in HPI. Objective:  Physical Exam: Filed Vitals:   02/20/14 1057  BP: 110/65  Pulse: 80  Temp: 98.9 F (37.2 C)  TempSrc: Oral  Height: 5\' 4"  (1.626 m)  Weight: 140 lb (63.504 kg)  SpO2: 100%   Constitutional: Vital signs reviewed.  Patient is a well-developed and well-nourished, sitting in a wheel chair and is in no acute distress and cooperative with exam.  Alert and oriented x3.  Head: Normocephalic and atraumatic Eyes:  Conjunctivae normal, No scleral icterus.  Neck: No carotid bruit present.  Cardiovascular: RRR, S1 normal, S2 normal, no MRG. Pulmonary/Chest: normal respiratory effort, CTAB, no wheezes, rales, or rhonchi Musculoskeletal: No joint deformities, erythema, or stiffness, ROM full and no nontender Neurological: A&O x3, Diffuse weakness in both LE 3/5, UE 4/5. Abnormal unsteady gait noted and needs a walker to ambulate. Skin: Warm, dry and intact.  Psychiatric: Normal mood and appears very calm.     Assessment & Plan:

## 2014-02-20 NOTE — Assessment & Plan Note (Signed)
Per patient, she finished her chemotherapy and radiation therapy. Management per Oncology.

## 2014-02-20 NOTE — Patient Instructions (Signed)
Take all the medications as advised. Follow up with Oncology and Neurology as recommended by them.

## 2014-02-20 NOTE — Assessment & Plan Note (Signed)
Currently well controlled.  Plans: Continue current regimen.

## 2014-02-23 NOTE — Progress Notes (Signed)
INTERNAL MEDICINE TEACHING ATTENDING ADDENDUM - Leimomi Zervas, MD: I reviewed and discussed at the time of visit with the resident Dr. Boggala, the patient's medical history, physical examination, diagnosis and results of tests and treatment and I agree with the patient's care as documented. 

## 2014-03-07 LAB — FUNGUS CULTURE W SMEAR: Smear Result: NONE SEEN

## 2014-03-09 ENCOUNTER — Encounter: Payer: Self-pay | Admitting: Neurology

## 2014-03-09 ENCOUNTER — Telehealth: Payer: Self-pay | Admitting: Neurology

## 2014-03-09 ENCOUNTER — Ambulatory Visit: Payer: Medicare Other | Admitting: Neurology

## 2014-03-09 ENCOUNTER — Ambulatory Visit (INDEPENDENT_AMBULATORY_CARE_PROVIDER_SITE_OTHER): Payer: Medicare Other | Admitting: Neurology

## 2014-03-09 VITALS — BP 127/81 | HR 87 | Ht 64.0 in | Wt 139.0 lb

## 2014-03-09 DIAGNOSIS — M792 Neuralgia and neuritis, unspecified: Secondary | ICD-10-CM

## 2014-03-09 DIAGNOSIS — G620 Drug-induced polyneuropathy: Secondary | ICD-10-CM

## 2014-03-09 DIAGNOSIS — IMO0002 Reserved for concepts with insufficient information to code with codable children: Secondary | ICD-10-CM

## 2014-03-09 DIAGNOSIS — T451X5A Adverse effect of antineoplastic and immunosuppressive drugs, initial encounter: Secondary | ICD-10-CM

## 2014-03-09 MED ORDER — FOLIC ACID-VIT B6-VIT B12 0.5-5-0.2 MG PO TABS: ORAL_TABLET | ORAL | Status: DC

## 2014-03-09 NOTE — Telephone Encounter (Signed)
Butch Penny, I am starting IVIG on this patient, please do authorization,  IVIG 400mg /kg x 5 days,  Then ivig 0.5g/kg x 2 days q3 weeks,   For demyelinating neuropathy, elevated CSF total protein 125 abnormal EMG nerve conduction study

## 2014-03-09 NOTE — Progress Notes (Signed)
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 Her primary care physician is Dr. Willis Modena her oncologist is Dr. Jana Hakim  She was diagnosed with clinical T2 NX, stage II invasive ductal carcinoma, grade 3, triple negative, with an MIB-1 of 84% , underwent right lumpectomy and right axillary lymph node dissection on 09/29/2012. The pathology from that procedure ([id number]) showed an area of approximately 1.1 cm showing residual invasive nests and cells of ductal carcinoma, grade 2, but mostly posttreatment effect. Repeat estrogen and progesterone receptors were again negative. All 11 lymph nodes were benign. Margins were ample.  She was started on chemotherapy, neoadjuvant doxorubicin and cyclophosphamide being given in dose dense fashion with Neulasta support starting 03/17/2013, followed by weekly carboplatin and paclitaxel x12, with final cycle 08/25/2013. She has developed chemotherapy-induced anemia, Status post blood transfusion, 2 units of packed red blood cells, on 06/10/2013 and again on 12-14.   This is followed by radiation therapy, from Feb 2015, completed 12/24/2013   She complains of bilateral feet and hands paresthesia around November 2014, close to the end of her chemotherapy, gradul onset, progressively worse,  She began to use walker in March 2015, despite stopping chemotherapy in December 2014, she continued to get worse, per patient "day by day", she now complains of tingling sensation, burning sensation in her feet, sand paper sensation in her hands, could no longer hold objects. Last time she still was in February 2015 no longer step on  gas pedal, or holding steer wheel.  She denies bowel bladder incontinence, She denies neck, low back pain,  She was started on gabapentin 300 mg qid  since April 2015, which did help her neuropathic pain, 60% improvement, tradamol was helpful too  She continued to have worsening anemia too, decreasing from 10.2 in February 2015 to 9.2.    Recent laboratory evaluation showed normal CMP with exception of decreased albumin 3 point 2, anemia, with hemoglobin 9 point 2, hematocrit 27 point 4 RDW 15 point 2, normal folic acid 9,6 T15 726, ferritin 937.   UPDATE June 15th 2015; Since her initial visit in May  4th 2015,  she had EMG nerve conduction study May 14th 2015, which has demonstrates severe peripheral neuropathy, with mixed demyelinating, and axonal features, she has absent sensory responses in upper and lower extremity, absent bilateral lower extremity motor response, and ulnar motor response, right median motor responses,showed prolonged distal latency, F waves latency, conduction velocity of 34 m/s, in the setting of severely decreased C. map amplitude. Spontaneous activity, active neuropathic changes involving lower extremity muscles, and distal upper extremity muscles  CSF showed WBC 0, RBC 0, glucose 65, total protein 125 significantly elevated,  Laboratory evaluation showed elevated TSH 15.9, free T4, mildly low 4.3, normal or negative HIV, RPR, ANA, O03 559, folic acid 9.6, elevated MMA 947, Thiamine, elevated ESR,74, CRP ,   We have reviewed MRI together, MRI of the brain showed moderate periventricular white matter disease, cervical, thoracic, lumbar spine showed degenerative disc disease, but there was no significant canal, or foraminal stenosis, She is taking gabapentin 300 mg 2 tablets 3 times a day, which has helped her neuropathic pain, but cause frequent urination,   REVIEW OF SYSTEMS: Full 14 system review of systems performed and notable only for runny nose, incontinence of bladder,  ALLERGIES:  Allergies  Allergen Reactions  . Penicillins Rash    HOME MEDICATIONS: Current Outpatient Prescriptions on File Prior to Visit   Medication Sig Dispense Refill  . Acetaminophen (TYLENOL EXTRA STRENGTH PO) Take 2 tablets by mouth daily as needed (for pain).       Marland Kitchen gabapentin (NEURONTIN) 300 MG capsule Take 1 capsule (300 mg total) by mouth 4 (four) times daily. Meals and bedtime  120 capsule  2  . hyaluronate sodium (RADIAPLEXRX) GEL Apply 1 application topically 2 (two) times daily.      Marland Kitchen LORazepam (ATIVAN) 0.5 MG tablet Take 1 tablet (0.5 mg total) by mouth at bedtime as needed for anxiety or sleep.  30 tablet  0  . Multiple Vitamin (MULTIVITAMIN WITH MINERALS) TABS Take 1 tablet by mouth daily. LIIQUID - Takes 1 cap full daily      . ondansetron (ZOFRAN) 8 MG tablet Take 8 mg by mouth every 8 (eight) hours as needed for nausea.       . pantoprazole (PROTONIX) 40 MG tablet Take 1 tablet (40 mg total) by mouth daily at 12 noon.  30 tablet  2  . prochlorperazine (COMPAZINE) 10 MG tablet Take 10 mg by mouth every 6 (six) hours as needed for nausea or vomiting.      . traMADol (ULTRAM) 50 MG tablet Take 1 tablet (50 mg total) by mouth every 6 (six) hours as needed for moderate pain.  90 tablet  0  . triamterene-hydrochlorothiazide (MAXZIDE-25) 37.5-25 MG per tablet Take 0.5 tablets by mouth daily.         PAST MEDICAL HISTORY: Past Medical History  Diagnosis Date  . Heart murmur   . Hypertension   . Anemia   . Allergy   . Migraines   . History of blood transfusion     "related to chemo/breast cancer" (09/29/2013)  . GERD (gastroesophageal reflux disease)   . Breast cancer   . Arthritis     "joints" (09/29/2013)  . TMJ (temporomandibular joint syndrome)     "left; just dx'd" (09/29/2013    PAST SURGICAL HISTORY: Past Surgical History  Procedure Laterality Date  . Portacath placement N/A 03/10/2013    Procedure: INSERTION PORT-A-CATH WITH FLUOROSCOPY AND ULTRASOUND;  Surgeon: Adin Hector, MD;  Location: [facility];  Service: General;  Laterality: N/A;  . Axillary lymph node biopsy Right 03/10/2013    Procedure:  AXILLARY LYMPH NODE BIOPSY;  Surgeon: Adin Hector, MD;  Location: [facility];  Service: General;  Laterality: Right;  sentinel node with blue dye  . Esophagogastroduodenoscopy N/A 04/28/2013    Procedure: ESOPHAGOGASTRODUODENOSCOPY (EGD);  Surgeon: Wonda Horner, MD;  Location: [facility];  Service: Endoscopy;  Laterality: N/A;  . Colonoscopy N/A 04/30/2013    Procedure: COLONOSCOPY;  Surgeon: Wonda Horner, MD;  Location: [facility];  Service: Endoscopy;  Laterality: N/A;  . Port-a-cath removal  09/29/2013  . Breast lumpectomy Right 09/29/2013    needle localization w/axillary LND/notes 09/29/2013  . Total abdominal hysterectomy      With bilateral salpingo-oophorectomy  . Tonsillectomy    . Mastectomy Right 09/29/2013    "partial"  . Breast lumpectomy with needle localization and axillary lymph node dissection Right 09/29/2013    Procedure: RIGHT BREAST NEEDLE LOCALIZED LUMPECTOMY AND AXILLARY LYMPH NODE DISSECTION;  Surgeon: Adin Hector, MD;  Location: [facility];  Service: General;  Laterality: Right;  . Port-a-cath removal Left 09/29/2013    Procedure: REMOVAL PORT-A-CATH;  Surgeon: Adin Hector, MD;  Location: [facility];  Service: General;  Laterality: Left;    FAMILY HISTORY: Family History  Problem Relation Age of Onset  . Breast Cancer Mother 71  . Congestive Heart Failure Mother   . Colon cancer Brother   . Diabetes Brother 29  . Heart disease Brother     AMI 05/22/2012  . Congestive Heart Failure Brother   . Breast Cancer Maternal Grandmother 68    SOCIAL HISTORY:  History   Social History  . Marital Status: Divorced    Spouse Name: n/a    Number of Children: 1  . Years of Education: 17   Occupational History  . retired     Sports coach  . subsitute     Social History Main Topics  . Smoking status: Former Smoker -- 1 years    Types: Cigarettes  . Smokeless tobacco: Never Used     Comment: smoked in college 1 yr   . Alcohol Use: No     Comment: Wine  occasionally  . Drug Use: No  . Sexual Activity: Not Currently    Partners: Male    Birth Control/ Protection: Surgical   Other Topics Concern  . Not on file   Social History Narrative   Retired, worked for [employer] system in [employer] in 7/08. Currently work as Oceanographer     PHYSICAL EXAM   Filed Vitals:   03/09/14 1147  BP: 127/81  Pulse: 87  Height: 5' 4"  (1.626 m)  Weight: 139 lb (63.05 kg)   Body mass index is 23.85 kg/(m^2).   Generalized: In no acute distress  Neck: Supple, no carotid bruits   Cardiac: Regular rate rhythm  Pulmonary: Clear to auscultation bilaterally  Musculoskeletal: No deformity  Neurological examination  Mentation: Alert oriented to time, place, history taking, and causual conversation, tired looking elderly female,  Cranial nerve II-XII: Pupils were equal round reactive to light. Extraocular movements were full.  Visual field were full on confrontational test. Bilateral fundi were sharp.  Facial sensation and strength were normal. Hearing was intact to finger rubbing bilaterally. Uvula tongue midline.  Head turning and shoulder shrug and were normal and symmetric.Tongue protrusion into cheek strength was normal.  Motor: Moderate back and hands intrinsic muscle atrophy, bilateral shoulder abduction 5, external rotation 5, elbow flexion4+, elbow extension 4, wrist flexion4-, wrist extension4-, grip4-, hip flexion 5  knee flexion 5 knee extension 5  Ankle dorsi flexion1 plantar flexion1.  Sensory: length dependent decreased  fine touch, pinprick to  wrist,  and  midshin level,, absent vibratory sensationto midshin,  decreased at fingertips,  and absent proprioception at toes, fingers.  Coordination: Normal finger to nose, heel-to-shin bilaterally there was no truncal ataxia  Gait: need walker to ambulate, wide based, unsteady.   Romberg signs: Negative  Deep tendon reflexes: areflexia. plantar responses were flexor  bilaterally.   DIAGNOSTIC DATA (LABS, IMAGING, TESTING) - I reviewed patient records, labs, notes, testing and imaging myself where available.  Lab Results  Component Value Date   WBC 5.5 01/20/2014   HGB 9.2* 01/20/2014   HCT 27.4* 01/20/2014   MCV 100.7 01/20/2014   PLT 264 01/20/2014      Component Value Date/Time   NA 145 01/20/2014 0901   NA 142 09/16/2013 1000   K 3.8 01/20/2014 0901   K 3.4* 09/16/2013 1000   CL 101 09/16/2013 1000   CL 100 03/13/2013 0918   CO2 26 01/20/2014 0901   CO2 27 09/16/2013  1000   GLUCOSE 106 01/20/2014 0901   GLUCOSE 103* 09/16/2013 1000   GLUCOSE 169* 03/13/2013 0918   BUN 11.6 01/20/2014 0901   BUN 13 09/16/2013 1000   CREATININE 0.8 01/20/2014 0901   CREATININE 0.94 09/16/2013 1000   CREATININE 0.94 11/29/2011 1535   CALCIUM 9.8 01/20/2014 0901   CALCIUM 9.6 09/16/2013 1000   PROT 7.4 01/26/2014 1057   PROT 7.4 01/20/2014 0901   PROT 7.2 09/16/2013 1000   ALBUMIN 3.2* 01/20/2014 0901   ALBUMIN 3.6 09/16/2013 1000   AST 34 01/20/2014 0901   AST 20 09/16/2013 1000   ALT 35 01/20/2014 0901   ALT 21 09/16/2013 1000   ALKPHOS 101 01/20/2014 0901   ALKPHOS 89 09/16/2013 1000   BILITOT 0.28 01/20/2014 0901   BILITOT 0.3 09/16/2013 1000   GFRNONAA 62* 09/16/2013 1000   GFRNONAA 64 11/29/2011 1535   GFRAA 72* 09/16/2013 1000   GFRAA 74 11/29/2011 1535   Lab Results  Component Value Date   CHOL 164 11/29/2011   HDL 43 11/29/2011   LDLCALC 87 11/29/2011   TRIG 170* 11/29/2011   CHOLHDL 3.8 11/29/2011   Lab Results  Component Value Date   HGBA1C 7.1* 04/27/2013   Lab Results  Component Value Date   VITAMINB12 623 01/20/2014   Lab Results  Component Value Date   TSH 15.960* 01/26/2014    ASSESSMENT AND PLAN  MIKAEL DEBELL is a 68 y.o. female  with history of breast cancer, status post lobectomy in May 2014, followed by chemotherapy, radiation therapy, now with progressive worsening gait difficulty, distal arm and leg  muscle atrophy, profound weakness, length  dependent sensory changes, areflexia.  Localized the lesion to prepheral nervous system, she has mixed demyelinating, axonal peripheral neuropathy, elevated CSF protein 125,  1, we will try IVIG infusion, if approved by insurance company 2. nerve biopsy 3. laboratory showed elevated methylmalonic acid level in the setting of normal Y03, folic acid, will repeat laboratory again, in the meantime, we will proceed with folic acid, E96 supplement,    Marcial Pacas, M.D. Ph.D.  [facility] [address], [place],  [address] [phone]

## 2014-03-10 ENCOUNTER — Telehealth: Payer: Self-pay | Admitting: Neurology

## 2014-03-10 NOTE — Telephone Encounter (Signed)
[facility] call from [facility] about having [facility] and she would prefer going to a facility to have it done. Please call

## 2014-03-10 NOTE — Telephone Encounter (Signed)
FYI

## 2014-03-10 NOTE — Telephone Encounter (Signed)
Pt's daughter called back states to disregard last message that she has decided to stay with [facility]. Thanks

## 2014-03-10 NOTE — Telephone Encounter (Signed)
Dr. Krista Blue,  I have sent this to [facility] to see if they can get authorization.  Her insurance is [employer] and they will only approve home health infusion and not outpatient.  I will let you know once I hear something.

## 2014-03-12 ENCOUNTER — Other Ambulatory Visit: Payer: Self-pay | Admitting: Physician Assistant

## 2014-03-12 ENCOUNTER — Ambulatory Visit (INDEPENDENT_AMBULATORY_CARE_PROVIDER_SITE_OTHER): Payer: Medicare Other | Admitting: General Surgery

## 2014-03-12 ENCOUNTER — Encounter (INDEPENDENT_AMBULATORY_CARE_PROVIDER_SITE_OTHER): Payer: Self-pay | Admitting: General Surgery

## 2014-03-12 VITALS — BP 124/82 | HR 76 | Temp 97.2°F | Resp 18 | Wt 140.0 lb

## 2014-03-12 DIAGNOSIS — C50119 Malignant neoplasm of central portion of unspecified female breast: Secondary | ICD-10-CM

## 2014-03-12 DIAGNOSIS — Z853 Personal history of malignant neoplasm of breast: Secondary | ICD-10-CM

## 2014-03-12 NOTE — Progress Notes (Signed)
Patient ID: Tabitha Ramirez, female   DOB: 1945-12-21, 68 y.o.   MRN: [id number] History:  The patient returns for long-term followup of her right breast cancer. This patient underwent neoadjuvant chemotherapy for a 3.5 cm triple negative breast cancer of the right breast centrally. She had a pretreatment since although biopsy and Port-A-Cath insertion revealing one out of 2 lymph nodes to be positive. MRI demonstrated partial response. Physical exam revealed mild response, and continued involvement of the subareolar area.  On 09/29/2013 she underwent removal of Port-A-Cath, right central lumpectomy with needle localization, tissue transfer reconstruction, and right axillary lymph node dissection, complete.  Final pathology report shows scattered residual invasive nests of ductal carcinoma spanning 1.1 cm, lobular neoplasia, negative margins. 11 lymph nodes were analyzed and they are all negative. This was still a triple negative breast cancer with Ki-67 84%. Final pathologic stage ypT1c, ypN0.  She completed her radiation therapy on April 1. She has noted significant place of about her breast or right arm. Her only serious complaint is chemotherapy-induced neuropathy. This involves numbness and pain of all 4 extremities and she is now admitted with a walker and followed by a neurologist. She is on gabapentin. Other therapies are being considered.   Exam:  Patient looks well. No distress. Daughter is with her.  Right lumpectomy incision is healing nicely. Good breast mound with good contour. No hematoma. Radiation therapy changes are settling down.Right axillary wound healing uneventfully.No seroma.No arm swelling. Range of motion right shoulder is 100%. No cervical adenopathy Lungs clear to auscultation bilaterally Heart regular rate and rhythm. No ectopy.  Assessment:  Triple negative breast cancer, right breast, central, subareolar. YpT1c, ypNo.  Status post pre treatment sentinel node biopsy (+)  And Port-A-Cath  Status post neoadjuvant chemotherapy with excellent partial response  Status post recent Port-A-Cath removal, central lumpectomy and axillary lymph node dissection  She has completed radiation therapy Neuropathy, on gabapentin   Plan:  Continue followup with Dr. Jana Hakim and with neurology regarding her neuropathy Mammograms on 03/24/2014 and annually thereafter Return to see me in 6 months  Diet and activities emphasized.     Edsel Petrin. Dalbert Batman, M.D., [facility]  General and Minimally invasive Surgery  Breast and Colorectal Surgery  Office: [phone]  Pager: [phone]

## 2014-03-12 NOTE — Patient Instructions (Signed)
Examination of your breasts and all the lymph nodes today is normal. There is no evidence of cancer.  Be sure to get her mammograms on June 30, as scheduled.  Continue followup with Dr. Jana Hakim  and the neurologist to treat your neuropathy  Return to see Dr. Dalbert Batman in 6 months.

## 2014-03-13 NOTE — Telephone Encounter (Signed)
Tabitha Ramirez from [facility] has PA and form completed for pharmacy, [facility].

## 2014-03-17 ENCOUNTER — Telehealth: Payer: Self-pay | Admitting: Neurology

## 2014-03-17 ENCOUNTER — Telehealth: Payer: Self-pay | Admitting: *Deleted

## 2014-03-17 ENCOUNTER — Other Ambulatory Visit: Payer: Self-pay | Admitting: Physician Assistant

## 2014-03-17 NOTE — Telephone Encounter (Signed)
Spoke with patient and rescheduled and confirmed new appointment for 03/23/14 at 1015 for labs and 1045 for Tabitha Ramirez.

## 2014-03-17 NOTE — Telephone Encounter (Signed)
Patient calling to check status of referral to Neuro Surgeon for Biopsy.  Please call and advise

## 2014-03-18 ENCOUNTER — Telehealth: Payer: Self-pay | Admitting: Neurology

## 2014-03-18 NOTE — Telephone Encounter (Signed)
Received Notice of Denial of Medicare Prescription Drug Coverage for Octagam.  Octagam is denied because additional information is needed to make a decision:  Further consideration requires clarification of the diagnosis provided if the diagnosis includes Chronic Infammatory Demyelinating Polyneuropathy or Multifocal Motor Neuropathy.    For an expedited appeal please have:  Patients name and address, Member insurance number, Reason for Appeal (evidence), and call [phone].  Hinton Dyer, Dr. Krista Blue would like you to call number and then get her for the call.  I will put all the information on your desk.

## 2014-03-18 NOTE — Telephone Encounter (Signed)
I called I could not get threw on Line. I will still process her referral and call Patient.

## 2014-03-19 NOTE — Telephone Encounter (Signed)
Butch Penny, letter for her IVIG appeal is ready, date June 25th 2015, please move forward.

## 2014-03-19 NOTE — Telephone Encounter (Signed)
Called patient and left her a message stating please be accepting a phone call from [place].

## 2014-03-19 NOTE — Telephone Encounter (Signed)
I finally got through to the correct number and person for the appeal process.  The appeal has to be done by faxing the information to the [facility] at [phone].  The doctor needs to write a letter stating why it should be approved with objective clinical information.  The letter, enrollee and prescriber information form, insurance card, demographics, last office note need to be faxed to above number with [facility] on cover sheet.    Dr. Krista Blue  I will leave forms for you to complete on desk.

## 2014-03-21 ENCOUNTER — Other Ambulatory Visit: Payer: Self-pay | Admitting: Physician Assistant

## 2014-03-21 DIAGNOSIS — C50119 Malignant neoplasm of central portion of unspecified female breast: Secondary | ICD-10-CM

## 2014-03-23 ENCOUNTER — Other Ambulatory Visit: Payer: Medicare Other

## 2014-03-23 ENCOUNTER — Telehealth: Payer: Self-pay | Admitting: Nurse Practitioner

## 2014-03-23 ENCOUNTER — Ambulatory Visit (HOSPITAL_BASED_OUTPATIENT_CLINIC_OR_DEPARTMENT_OTHER): Payer: Medicare Other | Admitting: Nurse Practitioner

## 2014-03-23 ENCOUNTER — Telehealth: Payer: Self-pay | Admitting: Neurology

## 2014-03-23 VITALS — BP 146/78 | HR 85 | Temp 98.6°F | Resp 18 | Ht 64.0 in | Wt 143.3 lb

## 2014-03-23 DIAGNOSIS — D6481 Anemia due to antineoplastic chemotherapy: Secondary | ICD-10-CM

## 2014-03-23 DIAGNOSIS — Z853 Personal history of malignant neoplasm of breast: Secondary | ICD-10-CM

## 2014-03-23 DIAGNOSIS — G620 Drug-induced polyneuropathy: Secondary | ICD-10-CM

## 2014-03-23 DIAGNOSIS — M792 Neuralgia and neuritis, unspecified: Secondary | ICD-10-CM

## 2014-03-23 DIAGNOSIS — T451X5A Adverse effect of antineoplastic and immunosuppressive drugs, initial encounter: Secondary | ICD-10-CM

## 2014-03-23 LAB — CBC WITH DIFFERENTIAL/PLATELET
BASO%: 0.6 % (ref 0.0–2.0)
Basophils Absolute: 0 10*3/uL (ref 0.0–0.1)
EOS%: 3 % (ref 0.0–7.0)
Eosinophils Absolute: 0.2 10*3/uL (ref 0.0–0.5)
HCT: 31.7 % — ABNORMAL LOW (ref 34.8–46.6)
HGB: 10.6 g/dL — ABNORMAL LOW (ref 11.6–15.9)
LYMPH%: 37.6 % (ref 14.0–49.7)
MCH: 33.4 pg (ref 25.1–34.0)
MCHC: 33.4 g/dL (ref 31.5–36.0)
MCV: 100 fL (ref 79.5–101.0)
MONO#: 0.4 10*3/uL (ref 0.1–0.9)
MONO%: 6.6 % (ref 0.0–14.0)
NEUT#: 3.4 10*3/uL (ref 1.5–6.5)
NEUT%: 52.2 % (ref 38.4–76.8)
PLATELETS: 246 10*3/uL (ref 145–400)
RBC: 3.17 10*6/uL — AB (ref 3.70–5.45)
RDW: 15.5 % — ABNORMAL HIGH (ref 11.2–14.5)
WBC: 6.4 10*3/uL (ref 3.9–10.3)
lymph#: 2.4 10*3/uL (ref 0.9–3.3)

## 2014-03-23 LAB — COMPREHENSIVE METABOLIC PANEL (CC13)
ALK PHOS: 82 U/L (ref 40–150)
ALT: 42 U/L (ref 0–55)
AST: 40 U/L — ABNORMAL HIGH (ref 5–34)
Albumin: 3.8 g/dL (ref 3.5–5.0)
Anion Gap: 11 mEq/L (ref 3–11)
BILIRUBIN TOTAL: 0.55 mg/dL (ref 0.20–1.20)
BUN: 11.8 mg/dL (ref 7.0–26.0)
CO2: 29 mEq/L (ref 22–29)
Calcium: 9.9 mg/dL (ref 8.4–10.4)
Chloride: 101 mEq/L (ref 98–109)
Creatinine: 1.1 mg/dL (ref 0.6–1.1)
GLUCOSE: 123 mg/dL (ref 70–140)
Potassium: 3.4 mEq/L — ABNORMAL LOW (ref 3.5–5.1)
SODIUM: 141 meq/L (ref 136–145)
Total Protein: 8.2 g/dL (ref 6.4–8.3)

## 2014-03-23 NOTE — Telephone Encounter (Signed)
All information has been faxed to [place] concerning Appeal, and confirmation received.

## 2014-03-23 NOTE — Telephone Encounter (Signed)
per pof to sch pt appt-sch appt gave pt copy of sch. Pt req to mail copto her home for the person that drives her-adv I would mail tday

## 2014-03-23 NOTE — Telephone Encounter (Signed)
Referral form has been faxed for Sural Nerve Biopsy . [facility] will call and schedule patient. General Surgery sent me to Neuro Surgery.

## 2014-03-23 NOTE — Telephone Encounter (Signed)
Spoke to patient's daughter and relayed that original request was denied, but Appeal letter and information has been faxed.  I will contact the patient as soon as I hear back.

## 2014-03-23 NOTE — Telephone Encounter (Signed)
Called patient and she didn't answer left her a message stating she had not been scheduled yet and [facility]  should be calling her appt. Soon.

## 2014-03-23 NOTE — Telephone Encounter (Signed)
This is for IVIG therapy.  It appears Dr Krista Blue is appealing this denial.  Forwarding request to nurse for further review.

## 2014-03-23 NOTE — Progress Notes (Signed)
ID: Tabitha Ramirez OB: Jan 05, 1946  MR#: [id number]  CSN#:[id number]  PCP: Tabitha Dukes, MD GYN:  Tabitha Fowler, MD SU: Tabitha Skates, MD OTHER MD: Tabitha Silversmith, MD;  Tabitha Fret, MD  CHIEF COMPLAINT:  Hx of Right Breast Cancer  HISTORY OF PRESENT ILLNESS: Tabitha Ramirez tells me she felt Tabitha mass in her right breast about 3 months ago but initially ignored it. She eventually brought it to her physician's attention and was set up for bilateral diagnostic mammography at the breast Center 01/31/2013. This showed an area of distortion in the upper outer quadrant measuring approximately 4.3 cm. Diffuse calcifications in both breasts were stable. The mass was palpable at 12:00, 3 cm from the nipple, and by ultrasound it was irregular, hypoechoic, and measured 2.5 cm. In addition there was prominent right axillary adenopathy the largest lymph node measuring 2.5 cm.  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 Biopsy of the lymph node was negative. It did show some lymph node tissue.  Bilateral breast MRI 02/10/2013 showed Tabitha ring-enhancing necrotic-appearing 3.4 cm mass in the anterior third of the right breast. There was also Tabitha 2.0 cm level one right axillary lymph node that was prominent, but maintains central fat. The left breast the left axilla were benign and there was no internal mammary adenopathy noted.  The patient's subsequent history is as detailed below.  INTERVAL HISTORY: Tabitha Ramirez returns today accompanied by her daughter Tabitha Ramirez for followup of her breast cancer. She is currently being followed with observation alone (labs and physical exam) for her triple negative breast cancer.  Her daughter Tabitha Ramirez is pregnant and expecting her first baby in December.  They are both excited about this news.   Tabitha Ramirez is walking today with the use of Tabitha walker and states she is  slowly improving. She feels that her pain is well controlled with gabapentin. 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 This has been almost disabling.Her pain is finally under control and she is being followed by neurology - she is on 1873m of Gabapentin, taking 600 three times Tabitha day. She has difficulty sleeping and utilizes lorazepam sparingly for anxiety and insomnia.    REVIEW OF SYSTEMS: LAniayahhas had no recent fevers or chills. She denies any rashes or skin changes. She is doing well overall, continues to see neurology but states her neuropathy has improved "slightly" since our last office visit.  She denies any abnormal bruising and has had no abnormal bleeding. Her appetite is good. She's had no nausea, emesis, or change in bowel or bladder habits. She has noticed no blood in the stool, and has had no dark or tarry stools. She denies any cough, or shortness of breath. She also denies any abnormal headaches or dizziness. She denies any myalgias or arthralgias.  Tabitha detailed review of systems is otherwise stable and noncontributory.  PAST MEDICAL HISTORY: Past Medical History  Diagnosis Date  . Heart murmur   . Hypertension   . Anemia   . Allergy   . Migraines   . History of blood transfusion     "related to chemo/breast cancer" (09/29/2013)  . GERD (gastroesophageal reflux disease)   . Breast cancer   . Arthritis     "joints" (09/29/2013)  . TMJ (temporomandibular joint syndrome)     "left; just dx'd" (09/29/2013  .  Radiation 11/05/13-12/24/13    Right breast     PAST SURGICAL HISTORY: Past Surgical History  Procedure Laterality Date  . Portacath placement N/Tabitha 03/10/2013    Procedure: INSERTION PORT-Tabitha-CATH WITH FLUOROSCOPY AND ULTRASOUND;  Surgeon: Adin Hector, MD;  Location: [place];  Service: General;  Laterality: N/Tabitha;  . Axillary lymph node biopsy Right 03/10/2013    Procedure: AXILLARY LYMPH NODE BIOPSY;  Surgeon:  Adin Hector, MD;  Location: [place];  Service: General;  Laterality: Right;  sentinel node with blue dye  . Esophagogastroduodenoscopy N/Tabitha 04/28/2013    Procedure: ESOPHAGOGASTRODUODENOSCOPY (EGD);  Surgeon: Wonda Horner, MD;  Location: [facility];  Service: Endoscopy;  Laterality: N/Tabitha;  . Colonoscopy N/Tabitha 04/30/2013    Procedure: COLONOSCOPY;  Surgeon: Wonda Horner, MD;  Location: [facility];  Service: Endoscopy;  Laterality: N/Tabitha;  . Port-Tabitha-cath removal  09/29/2013  . Breast lumpectomy Right 09/29/2013    needle localization w/axillary LND/notes 09/29/2013  . Total abdominal hysterectomy      With bilateral salpingo-oophorectomy  . Tonsillectomy    . Mastectomy Right 09/29/2013    "partial"  . Breast lumpectomy with needle localization and axillary lymph node dissection Right 09/29/2013    Procedure: RIGHT BREAST NEEDLE LOCALIZED LUMPECTOMY AND AXILLARY LYMPH NODE DISSECTION;  Surgeon: Adin Hector, MD;  Location: [place];  Service: General;  Laterality: Right;  . Port-Tabitha-cath removal Left 09/29/2013    Procedure: REMOVAL PORT-Tabitha-CATH;  Surgeon: Adin Hector, MD;  Location: [place];  Service: General;  Laterality: Left;    FAMILY HISTORY Family History  Problem Relation Age of Onset  . Cancer Mother 49    Unknown type of cancer  . Congestive Heart Failure Mother   . Colon cancer Brother   . Diabetes Brother 57  . Heart disease Brother     AMI 05/22/2012  . Congestive Heart Failure Brother   . Cancer Maternal Grandmother 55    Unknown type of cancer  the patient's father died in an automobile accident at the age of 75. The patient's mother lived to be 13. [name] has 3 brothers, no sisters. One brother had colon cancer at the age of 41. The patient's mother and the patient's mothers mother (maternal grandmother) both were diagnosed with some kind of cancer at the age of 75 or thereabouts, but [name] does not know what type of cancer this may have been.  GYNECOLOGIC HISTORY:   (Reviewed on  01/20/2014)  Menarche age 64, first live birth age 38. The patient had her hysterectomy in the 1970s. She has been on estrogen replacement for she thinks 18 years.  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 She is divorced, lives by herself, with no pets. Her first child was given up for adoption. Her second child, Reggy Eye, is Tabitha Theatre stage manager and is working towards a PhD at [employer]. Lavonne has no grandchildren. She attends new [employer]    ADVANCED DIRECTIVES: In place. The patient has named her daughter Tabitha Ramirez as her healthcare power of attorney. Tabitha Ramirez can be reached at [phone]. The healthcare power of attorney documents were brought by the patient 03/13/2013 and are separately scanned.   HEALTH MAINTENANCE: ( Updated on 01/20/2014) History  Substance Use Topics  . Smoking status: Former Smoker -- 1 years    Types: Cigarettes  . Smokeless tobacco: Never  Used     Comment: smoked in college 1 yr   . Alcohol Use: No     Comment: Wine occasionally     Colonoscopy: August 2014/[facility]  PAP:Not on file  Bone density: Never  Lipid panel: Dr. Willis Modena    Allergies  Allergen Reactions  . Penicillins Rash   Current Outpatient Prescriptions on File Prior to Visit  Medication Sig Dispense Refill  . Acetaminophen (TYLENOL EXTRA STRENGTH PO) Take 2 tablets by mouth daily as needed (for pain).       . Folic Acid-Vit C9-OBS J62 0.5-5-0.2 MG TABS One tab po bid  60 each  12  . gabapentin (NEURONTIN) 300 MG capsule Take 2 capsules (600 mg total) by mouth 3 (three) times daily. Meals and bedtime  180 capsule  12  . LORazepam (ATIVAN) 0.5 MG tablet Take 1 tablet (0.5 mg total) by mouth at bedtime as needed for anxiety or sleep.  30 tablet  0  . Multiple Vitamin (MULTIVITAMIN WITH MINERALS) TABS Take 1 tablet by mouth daily.  LIIQUID - Takes 1 cap full daily      . ondansetron (ZOFRAN) 8 MG tablet Take 8 mg by mouth every 8 (eight) hours as needed for nausea.       . prochlorperazine (COMPAZINE) 10 MG tablet Take 10 mg by mouth every 6 (six) hours as needed for nausea or vomiting.      . traMADol (ULTRAM) 50 MG tablet Take 1 tablet (50 mg total) by mouth every 6 (six) hours as needed for moderate pain.  90 tablet  0  . triamterene-hydrochlorothiazide (MAXZIDE-25) 37.5-25 MG per tablet Take 0.5 tablets by mouth daily.  30 tablet  11  . pantoprazole (PROTONIX) 40 MG tablet TAKE 1 TABLET (40 MG TOTAL) BY MOUTH DAILY AT 12 NOON.  30 tablet  3   No current facility-administered medications on file prior to visit.     Objective: Middle-aged [place] American woman who walks with aid of Tabitha walker.  She was able to step up onto the exam table today, but with assistance. Filed Vitals:   03/23/14 1054  BP: 146/78  Pulse: 85  Temp: 98.6 F (37 C)  Resp: 18     Body mass index is 24.59 kg/(m^2).    ECOG FS: 2 Filed Weights   03/23/14 1054  Weight: 143 lb 4.8 oz (65 kg)   Physical Exam: .  NODES:  No cervical or supraclavicular or axillary lymphadenopathy palpated.  BREAST EXAM:  Right breast is status post lumpectomy and radiation. No suspicious nodularities or skin changes. No evidence of local recurrence. Left breast is unremarkable. Axillae are benign bilaterally, with palpable lymphadenopathy. Pt states the skin feels 'tight' but no tenderness on palpation. LUNGS:  Clear to auscultation bilaterally.  No wheezes or rhonchi HEART:  Regular rate and rhythm. No murmur  ABDOMEN:  Soft, nontender. No organomegaly or masses. Positive bowel sounds.  MSK:  No focal spinal tenderness to palpation. Good range of motion bilaterally in the upper extremities. Limited range of motion in the feet and ankles, difficulty with dorsiflexion EXTREMITIES:  Nonpitting pedal edema, equal bilaterally. No erythema. No palpable cords. No  lymphedema in the upper tremor these.   SKIN:  No visible rashes. No excessive ecchymoses. No petechiae.  NEURO:  Nonfocal. Well oriented. Fatigued affect.  Numbness noted in both feet subjectively.    LAB RESULTS:   Lab Results  Component Value Date   WBC 6.4 03/23/2014   NEUTROABS 3.4 03/23/2014  HGB 10.6* 03/23/2014   HCT 31.7* 03/23/2014   MCV 100.0 03/23/2014   PLT 246 03/23/2014      Chemistry      Component Value Date/Time   NA 141 03/23/2014 1039   NA 142 09/16/2013 1000   K 3.4* 03/23/2014 1039   K 3.4* 09/16/2013 1000   CL 101 09/16/2013 1000   CL 100 03/13/2013 0918   CO2 29 03/23/2014 1039   CO2 27 09/16/2013 1000   BUN 11.8 03/23/2014 1039   BUN 13 09/16/2013 1000   CREATININE 1.1 03/23/2014 1039   CREATININE 0.94 09/16/2013 1000   CREATININE 0.94 11/29/2011 1535      Component Value Date/Time   CALCIUM 9.9 03/23/2014 1039   CALCIUM 9.6 09/16/2013 1000   ALKPHOS 82 03/23/2014 1039   ALKPHOS 89 09/16/2013 1000   AST 40* 03/23/2014 1039   AST 20 09/16/2013 1000   ALT 42 03/23/2014 1039   ALT 21 09/16/2013 1000   BILITOT 0.55 03/23/2014 1039   BILITOT 0.3 09/16/2013 1000       STUDIES: Bilateral mammogram is scheduled for tomorrow March 24, 2014..   ASSESSMENT: 68 y.o. [place] woman   (1)  status post right central breast biopsy 01/31/2013 for Tabitha clinical T2 NX, stage II invasive ductal carcinoma, grade 3, triple negative, with an MIB-1 of 84%  (2) biopsy of an enlarged (2.5 cm) right axillary lymph node 01/31/2013 was negative, felt possibly discordant  (3) sentinel lymph node sampling 03/10/2013 showed one of two sentinel nodes to be positive; repeat prognostic now showing estrogen receptor 3% positive with weak staining and progesterone receptor 6% positive, with moderate staining. Repeat MIB-1 was 50%. HER-2/neu was again not amplified  (4) neoadjuvant doxorubicin and cyclophosphamide being given in dose dense fashion with Neulasta support starting  03/17/2013, followed by weekly carboplatin and paclitaxel x12, with final cycle 08/25/2013  (5) status post left lumpectomy and axillary lymph node dissection 09/29/2013 for Tabitha ypT1c ypN0 invasive ductal carcinoma, grade 2, estrogen and progesterone receptor again negative, with insufficient tissue for HER-2 retesting.  (6) radiation therapy, completed 12/24/2013  (7) chemotherapy-induced anemia.  This seems to have stabilized with her hgb today 10.6  (8) chemotherapy-induced peripheral neuropathy, grade 3  PLAN: The majority of our 30 minute appointment today was spent reviewing Gerrianne's current medical issues, discussing treatment options, and coordinating care. She will have mammogram tomorrow on March 24, 2014. She is doing okay overall and remains under care of [facility] who are working with her to regain her ability to walk without the aid of Tabitha walker. She states her neuropathy is "slightly" better and she remains on Gabapentin, with the dose varying based on how she is feeling - currently 1843m total (600 taken 3 times Tabitha day). Her hemoglobin is 10.6. We will bring her back in October and after that will see about seeing her every 6 months.  The above plan has been reviewed in detail with both LVaughan Bastaand MSharyn Lulltoday, and they were also given details of this plan in writing. They both voice understanding and agreement with this plan, and will call with any changes or problems.   [facility] NP   03/23/2014 4:48 PM

## 2014-03-24 ENCOUNTER — Telehealth: Payer: Self-pay | Admitting: Neurology

## 2014-03-24 ENCOUNTER — Ambulatory Visit: Payer: Medicare Other | Admitting: Physician Assistant

## 2014-03-24 ENCOUNTER — Ambulatory Visit
Admission: RE | Admit: 2014-03-24 | Discharge: 2014-03-24 | Disposition: A | Discharge status: Home / Self Care | Payer: Medicare Other | Source: Ambulatory Visit | Attending: Physician Assistant | Admitting: Physician Assistant

## 2014-03-24 ENCOUNTER — Other Ambulatory Visit: Payer: Medicare Other

## 2014-03-24 DIAGNOSIS — Z853 Personal history of malignant neoplasm of breast: Secondary | ICD-10-CM

## 2014-03-24 NOTE — Telephone Encounter (Signed)
Tabitha Ramirez with the Pharmacy appeal department at [facility] left message for a return call today for additional information.  Returned call and left message to please not close out the appeal and to return my call with the additional information she is requesting in regards to the Appeal for the IVIG.

## 2014-03-25 NOTE — Telephone Encounter (Signed)
All additional information was faxed to [place] at [facility] appeals dept on 03-24-14 and confirmation received.

## 2014-04-07 NOTE — Telephone Encounter (Signed)
Received confirmation from [employer], that appeal has been approved for medication Octagam.  Authorization number is [id number], valid March 12, 2014 through October 1,2015.  I will contact [facility] to have patient scheduled for infusion.

## 2014-04-07 NOTE — Telephone Encounter (Signed)
Patient is not going to [facility] but having home infusion by [facility].

## 2014-04-07 NOTE — Telephone Encounter (Signed)
Tabitha Ramirez, please make sure that she has an appt 2-3 weeks after IVIG home infusion

## 2014-04-08 ENCOUNTER — Other Ambulatory Visit: Payer: Self-pay | Admitting: Neurology

## 2014-04-08 DIAGNOSIS — G6289 Other specified polyneuropathies: Secondary | ICD-10-CM | POA: Insufficient documentation

## 2014-04-08 NOTE — Telephone Encounter (Signed)
Called patient and told her to please call back after her IVIG is scheduled. Patient had not been scheduled yet. Patient understood because Dr.Yan wants to see her a couple of weeks after IVIG.

## 2014-04-09 ENCOUNTER — Telehealth: Payer: Self-pay | Admitting: Neurology

## 2014-04-09 NOTE — Telephone Encounter (Signed)
I have talked with Dr. Rosana Hoes, rehab physician, who has recommend, bowel/bladder program.   She has no bowel incontinence, no bladder incontinence.   Dr. Rosana Hoes will refer her to [facility]

## 2014-04-10 ENCOUNTER — Ambulatory Visit: Payer: Medicare Other | Admitting: Neurology

## 2014-04-13 ENCOUNTER — Ambulatory Visit (INDEPENDENT_AMBULATORY_CARE_PROVIDER_SITE_OTHER): Payer: Medicare Other | Admitting: General Surgery

## 2014-04-14 ENCOUNTER — Telehealth: Payer: Self-pay | Admitting: Neurology

## 2014-04-14 NOTE — Telephone Encounter (Signed)
Left message for Tiffany that referral was sent to [facility] in error.  This is already approved and scheduled through [facility].

## 2014-04-14 NOTE — Telephone Encounter (Signed)
Tiffany with [facility] @ [phone] received order for IV, IG..They do not do that, but has infusion company to take care of patient.

## 2014-04-20 ENCOUNTER — Ambulatory Visit: Payer: 59 | Admitting: Physical Therapy

## 2014-04-21 ENCOUNTER — Telehealth: Payer: Self-pay | Admitting: Neurology

## 2014-04-21 NOTE — Telephone Encounter (Signed)
Tabitha Ramirez, check on her appt with [facility] NEurologist and IVIg treatment, make sure she will have a follow up visit with me after [facility] NEurologist visit

## 2014-04-21 NOTE — Telephone Encounter (Signed)
I received a staff message from [place] at [facility] that the patient wanted to know how may IVIG treatments she was going to have.  I left message for patient that after her first treatment of 5 days, she will get IVIG for 2 days every 28 days for 5 consecutive treatments and then will evalutate treatment and make a decision.

## 2014-04-22 NOTE — Telephone Encounter (Signed)
Called pt and left message for pt to give our office a call back.

## 2014-05-08 ENCOUNTER — Other Ambulatory Visit: Payer: Self-pay | Admitting: Neurosurgery

## 2014-05-11 ENCOUNTER — Encounter (HOSPITAL_COMMUNITY): Payer: Self-pay | Admitting: Pharmacy Technician

## 2014-05-11 NOTE — Pre-Procedure Instructions (Signed)
Tabitha Ramirez  05/11/2014   Your procedure is scheduled on:  Wednesday, August 19th  Report to [place] at 630 AM.  Call this number if you have problems the morning of surgery: [phone]   Remember:   Do not eat food or drink liquids after midnight.   Take these medicines the morning of surgery with A SIP OF WATER: neurontin, synthroid, ativan if needed, protonix, tramadol if needed   Do not wear jewelry, make-up or nail polish.  Do not wear lotions, powders, or perfumes. You may wear deodorant.  Do not shave 48 hours prior to surgery. Men may shave face and neck.  Do not bring valuables to the hospital.  [facility] is not responsible  for any belongings or valuables.               Contacts, dentures or bridgework may not be worn into surgery.  Leave suitcase in the car. After surgery it may be brought to your room.  For patients admitted to the hospital, discharge time is determined by your  treatment team.               Patients discharged the day of surgery will not be allowed to drive home.  Please read over the following fact sheets that you were given: Pain Booklet, Coughing and Deep Breathing and Surgical Site Infection Prevention [place] - Preparing for Surgery  Before surgery, you can play an important role.  Because skin is not sterile, your skin needs to be as free of germs as possible.  You can reduce the number of germs on you skin by washing with CHG (chlorahexidine gluconate) soap before surgery.  CHG is an antiseptic cleaner which kills germs and bonds with the skin to continue killing germs even after washing.  Please DO NOT use if you have an allergy to CHG or antibacterial soaps.  If your skin becomes reddened/irritated stop using the CHG and inform your nurse when you arrive at Short Stay.  Do not shave (including legs and underarms) for at least 48 hours prior to the first CHG shower.  You may shave your face.  Please follow these  instructions carefully:   1.  Shower with CHG Soap the night before surgery and the morning of Surgery.  2.  If you choose to wash your hair, wash your hair first as usual with your normal shampoo.  3.  After you shampoo, rinse your hair and body thoroughly to remove the shampoo.  4.  Use CHG as you would any other liquid soap.  You can apply CHG directly to the skin and wash gently with scrungie or a clean washcloth.  5.  Apply the CHG Soap to your body ONLY FROM THE NECK DOWN.  Do not use on open wounds or open sores.  Avoid contact with your eyes, ears, mouth and genitals (private parts).  Wash genitals (private parts) with your normal soap.  6.  Wash thoroughly, paying special attention to the area where your surgery will be performed.  7.  Thoroughly rinse your body with warm water from the neck down.  8.  DO NOT shower/wash with your normal soap after using and rinsing off the CHG Soap.  9.  Pat yourself dry with a clean towel.            10.  Wear clean pajamas.            11.  Place clean sheets on  your bed the night of your first shower and do not sleep with pets.  Day of Surgery  Do not apply any lotions/deoderants the morning of surgery.  Please wear clean clothes to the hospital/surgery center.

## 2014-05-12 ENCOUNTER — Encounter (HOSPITAL_COMMUNITY): Payer: Self-pay

## 2014-05-12 ENCOUNTER — Encounter (HOSPITAL_COMMUNITY)
Admission: RE | Admit: 2014-05-12 | Discharge: 2014-05-12 | Disposition: A | Discharge status: Home / Self Care | Payer: Medicare Other | Source: Ambulatory Visit | Attending: Neurosurgery | Admitting: Neurosurgery

## 2014-05-12 DIAGNOSIS — Z9010 Acquired absence of unspecified breast and nipple: Secondary | ICD-10-CM | POA: Diagnosis not present

## 2014-05-12 DIAGNOSIS — G609 Hereditary and idiopathic neuropathy, unspecified: Secondary | ICD-10-CM | POA: Diagnosis present

## 2014-05-12 DIAGNOSIS — K219 Gastro-esophageal reflux disease without esophagitis: Secondary | ICD-10-CM | POA: Diagnosis not present

## 2014-05-12 DIAGNOSIS — Z9221 Personal history of antineoplastic chemotherapy: Secondary | ICD-10-CM | POA: Diagnosis not present

## 2014-05-12 DIAGNOSIS — C50119 Malignant neoplasm of central portion of unspecified female breast: Secondary | ICD-10-CM | POA: Diagnosis not present

## 2014-05-12 DIAGNOSIS — Z90710 Acquired absence of both cervix and uterus: Secondary | ICD-10-CM | POA: Diagnosis not present

## 2014-05-12 DIAGNOSIS — Z923 Personal history of irradiation: Secondary | ICD-10-CM | POA: Diagnosis not present

## 2014-05-12 DIAGNOSIS — I10 Essential (primary) hypertension: Secondary | ICD-10-CM | POA: Diagnosis not present

## 2014-05-12 DIAGNOSIS — R51 Headache: Secondary | ICD-10-CM | POA: Diagnosis not present

## 2014-05-12 DIAGNOSIS — Z87891 Personal history of nicotine dependence: Secondary | ICD-10-CM | POA: Diagnosis not present

## 2014-05-12 DIAGNOSIS — F411 Generalized anxiety disorder: Secondary | ICD-10-CM | POA: Diagnosis not present

## 2014-05-12 DIAGNOSIS — D649 Anemia, unspecified: Secondary | ICD-10-CM | POA: Diagnosis not present

## 2014-05-12 DIAGNOSIS — Z79899 Other long term (current) drug therapy: Secondary | ICD-10-CM | POA: Diagnosis not present

## 2014-05-12 LAB — CBC
HCT: 30.4 % — ABNORMAL LOW (ref 36.0–46.0)
Hemoglobin: 10.3 g/dL — ABNORMAL LOW (ref 12.0–15.0)
MCH: 33.7 pg (ref 26.0–34.0)
MCHC: 33.9 g/dL (ref 30.0–36.0)
MCV: 99.3 fL (ref 78.0–100.0)
Platelets: 245 10*3/uL (ref 150–400)
RBC: 3.06 MIL/uL — ABNORMAL LOW (ref 3.87–5.11)
RDW: 14.9 % (ref 11.5–15.5)
WBC: 6.6 10*3/uL (ref 4.0–10.5)

## 2014-05-12 LAB — BASIC METABOLIC PANEL
Anion gap: 13 (ref 5–15)
BUN: 18 mg/dL (ref 6–23)
CO2: 27 mEq/L (ref 19–32)
Calcium: 9.7 mg/dL (ref 8.4–10.5)
Chloride: 100 mEq/L (ref 96–112)
Creatinine, Ser: 0.82 mg/dL (ref 0.50–1.10)
GFR calc Af Amer: 85 mL/min — ABNORMAL LOW (ref >90)
GFR calc non Af Amer: 73 mL/min — ABNORMAL LOW (ref >90)
Glucose, Bld: 107 mg/dL — ABNORMAL HIGH (ref 70–99)
Potassium: 3.4 mEq/L — ABNORMAL LOW (ref 3.7–5.3)
Sodium: 140 mEq/L (ref 137–147)

## 2014-05-12 NOTE — Progress Notes (Addendum)
Anesthesia Chart Review: Patient is a 68 year old female posted for right sural nerve biopsy on 05/13/14 by Dr. Sherwood Gambler.  PAT was this afternoon.    History includes right breast cancer s/p chemotherapy, chemotherapy induced neutropenia and peripheral neuropathy and radiation therapy (completed 12/24/13), hospitalization for syncope related to profound anemia (hgb 5.6) requiring transfusion 04/2013, former smoker, HTN, heart murmur (mild AR/MR/TR 04/2013 echo), anemia (sickle cell trait by PCP notes), arthritis, TMJ, GERD, migraines, hysterectomy/BSO. She underwent PAC insertion and right axillary SN biopsy on 03/10/13 with one of two lymph nodes positive for metastatic disease followed by right central lumpectomy with right axillary node dissection and removal of Port-a-cath on 09/29/13. PCP is listed as Dr. Dellia Nims. She has been followed at [facility]. HEM-ONC is Dr. Jana Hakim. RAD-ONC is Dr. Lance Morin.  EKG on 05/12/14 showed: NSR, right BBB, LAFB, bifascicular block. EKG appears stable since 02/28/13.   Echo on 04/28/13 showed:  - Left ventricle: The cavity size was normal. Systolic function was normal. The estimated ejection fraction was in the range of 55% to 60%. Wall motion was normal; there were no regional wall motion abnormalities. Features are consistent with a pseudonormal left ventricular filling pattern, with concomitant abnormal relaxation and increased filling pressure (grade 2 diastolic dysfunction). - Aortic valve: Mild regurgitation. - Mitral valve: Mild regurgitation. - Pulmonic valve: Mild regurgitation. - Tricuspid valve: Trivial regurgitation.  - Pericardium: No pericardial effusion.   2V CXR on 05/12/14 showed: 1. Abnormal density in the right upper lobe. This persists from the 02/14/2014 MRI although seems less striking than on the prior exam. My top differential diagnostic consideration is radiation pneumonitis, correlate with location of radiation port.  Pulmonary malignancy or pneumonia less likely. Consider followup chest CT for further characterization. 2. Cardiomegaly with cephalization of blood flow compatible pulmonary venous hypertension.  I called this report to Manuela Schwartz at Dr. Donnella Bi office and routed the report to her radiation oncologist Dr. Pablo Ledger for any follow-up recommendations.   Chest CTA on 04/27/13 showed:  1. No evidence of pulmonary embolism.  2. Moderate-sized pericardial effusion.  3. Mild cardiomegaly with left ventricular enlargement and left ventricular hypertrophy. Moderate LAD coronary atherosclerosis.  4. Mild COPD/emphysema. No acute pulmonary disease.  5. Approximate 2.5 cm fluid collection in the right axilla consistent with a post-operative seroma or resolving hematoma.   Preoperative labs noted. H/H stable at 10.3/30.4 WBC 6.6, PLT 245K.   I reviewed her chart with one of our anesthesiologists prior to her breast surgery earlier this year.  Her EKG appears stable. No CV symptoms were documented at PAT. If no acute changes or new CV symptoms then I anticipate that she can proceed as planned.  George Hugh [facility] Short Stay Center/Anesthesiology Phone [phone] 05/12/2014 4:40 PM  Addendum: 05/12/2014 6:00 PM I received a message back from Dr. Pablo Ledger regarding patient's CXR findings.  She felt those findings were common after radiation to the lymph nodes. She did contact patient to ensure no definite signs of "radiation pneumonitis" such as persistent low grade fever, chills, chronic cough. Patient denied these symptom, so Dr. Pablo Ledger felt the more specific description would be "radiation changes" on CXR and felt patient could continue PRN follow-up with her.

## 2014-05-13 ENCOUNTER — Ambulatory Visit (HOSPITAL_COMMUNITY): Payer: Medicare Other | Admitting: Anesthesiology

## 2014-05-13 ENCOUNTER — Encounter (HOSPITAL_COMMUNITY)
Admission: RE | Disposition: A | Discharge status: Home / Self Care | Payer: Self-pay | Source: Ambulatory Visit | Attending: Neurosurgery

## 2014-05-13 ENCOUNTER — Encounter (HOSPITAL_COMMUNITY): Payer: Medicare Other | Admitting: Vascular Surgery

## 2014-05-13 ENCOUNTER — Encounter (HOSPITAL_COMMUNITY): Payer: Self-pay | Admitting: *Deleted

## 2014-05-13 ENCOUNTER — Ambulatory Visit (HOSPITAL_COMMUNITY)
Admission: RE | Admit: 2014-05-13 | Discharge: 2014-05-13 | Disposition: A | Discharge status: Home / Self Care | Payer: Medicare Other | Source: Ambulatory Visit | Attending: Neurosurgery | Admitting: Neurosurgery

## 2014-05-13 DIAGNOSIS — F411 Generalized anxiety disorder: Secondary | ICD-10-CM | POA: Insufficient documentation

## 2014-05-13 DIAGNOSIS — D649 Anemia, unspecified: Secondary | ICD-10-CM | POA: Insufficient documentation

## 2014-05-13 DIAGNOSIS — I10 Essential (primary) hypertension: Secondary | ICD-10-CM | POA: Insufficient documentation

## 2014-05-13 DIAGNOSIS — Z79899 Other long term (current) drug therapy: Secondary | ICD-10-CM | POA: Insufficient documentation

## 2014-05-13 DIAGNOSIS — Z87891 Personal history of nicotine dependence: Secondary | ICD-10-CM | POA: Insufficient documentation

## 2014-05-13 DIAGNOSIS — Z9221 Personal history of antineoplastic chemotherapy: Secondary | ICD-10-CM | POA: Insufficient documentation

## 2014-05-13 DIAGNOSIS — K219 Gastro-esophageal reflux disease without esophagitis: Secondary | ICD-10-CM | POA: Diagnosis not present

## 2014-05-13 DIAGNOSIS — C50119 Malignant neoplasm of central portion of unspecified female breast: Secondary | ICD-10-CM | POA: Insufficient documentation

## 2014-05-13 DIAGNOSIS — Z923 Personal history of irradiation: Secondary | ICD-10-CM | POA: Insufficient documentation

## 2014-05-13 DIAGNOSIS — R51 Headache: Secondary | ICD-10-CM | POA: Insufficient documentation

## 2014-05-13 DIAGNOSIS — Z9010 Acquired absence of unspecified breast and nipple: Secondary | ICD-10-CM | POA: Insufficient documentation

## 2014-05-13 DIAGNOSIS — Z90710 Acquired absence of both cervix and uterus: Secondary | ICD-10-CM | POA: Insufficient documentation

## 2014-05-13 DIAGNOSIS — G609 Hereditary and idiopathic neuropathy, unspecified: Secondary | ICD-10-CM | POA: Insufficient documentation

## 2014-05-13 HISTORY — PX: SURAL NERVE BX: SHX2476

## 2014-05-13 SURGERY — SURAL NERVE BIOPSY
Anesthesia: General | Site: Leg Lower | Laterality: Right

## 2014-05-13 MED ORDER — PROPOFOL 10 MG/ML IV BOLUS
INTRAVENOUS | Status: DC | PRN
  Administered 2014-05-13: 200 mg via INTRAVENOUS

## 2014-05-13 MED ORDER — FENTANYL CITRATE 0.05 MG/ML IJ SOLN
INTRAMUSCULAR | Status: AC
  Filled 2014-05-13: qty 5

## 2014-05-13 MED ORDER — ROCURONIUM BROMIDE 50 MG/5ML IV SOLN
INTRAVENOUS | Status: AC
  Filled 2014-05-13: qty 1

## 2014-05-13 MED ORDER — FENTANYL CITRATE 0.05 MG/ML IJ SOLN
INTRAMUSCULAR | Status: DC | PRN
  Administered 2014-05-13: 100 ug via INTRAVENOUS

## 2014-05-13 MED ORDER — PHENYLEPHRINE HCL 10 MG/ML IJ SOLN
INTRAMUSCULAR | Status: DC | PRN
  Administered 2014-05-13 (×3): 120 ug via INTRAVENOUS

## 2014-05-13 MED ORDER — LIDOCAINE-EPINEPHRINE 1 %-1:100000 IJ SOLN
INTRAMUSCULAR | Status: DC | PRN
  Administered 2014-05-13: 5 mL

## 2014-05-13 MED ORDER — PROPOFOL 10 MG/ML IV BOLUS
INTRAVENOUS | Status: AC
  Filled 2014-05-13: qty 20

## 2014-05-13 MED ORDER — 0.9 % SODIUM CHLORIDE (POUR BTL) OPTIME
TOPICAL | Status: DC | PRN
  Administered 2014-05-13: 1000 mL

## 2014-05-13 MED ORDER — GENTAMICIN IN SALINE 1.6-0.9 MG/ML-% IV SOLN
80.0000 mg | Freq: Once | INTRAVENOUS | Status: AC
  Administered 2014-05-13: 80 mg via INTRAVENOUS
  Filled 2014-05-13: qty 50

## 2014-05-13 MED ORDER — SODIUM CHLORIDE 0.9 % IR SOLN
Status: DC | PRN
  Administered 2014-05-13: 09:00:00

## 2014-05-13 MED ORDER — BUPIVACAINE HCL (PF) 0.25 % IJ SOLN
INTRAMUSCULAR | Status: DC | PRN
  Administered 2014-05-13: 5 mL

## 2014-05-13 MED ORDER — ONDANSETRON HCL 4 MG/2ML IJ SOLN
INTRAMUSCULAR | Status: AC
  Filled 2014-05-13: qty 2

## 2014-05-13 MED ORDER — ONDANSETRON HCL 4 MG/2ML IJ SOLN
INTRAMUSCULAR | Status: DC | PRN
  Administered 2014-05-13: 4 mg via INTRAVENOUS

## 2014-05-13 MED ORDER — ACETAMINOPHEN 10 MG/ML IV SOLN
INTRAVENOUS | Status: AC
  Administered 2014-05-13: 1000 mg via INTRAVENOUS
  Filled 2014-05-13: qty 100

## 2014-05-13 MED ORDER — LACTATED RINGERS IV SOLN
INTRAVENOUS | Status: DC | PRN
  Administered 2014-05-13: 08:00:00 via INTRAVENOUS

## 2014-05-13 MED ORDER — LIDOCAINE HCL (CARDIAC) 20 MG/ML IV SOLN
INTRAVENOUS | Status: DC | PRN
  Administered 2014-05-13: 100 mg via INTRAVENOUS

## 2014-05-13 MED ORDER — VANCOMYCIN HCL IN DEXTROSE 1-5 GM/200ML-% IV SOLN
INTRAVENOUS | Status: AC
  Administered 2014-05-13: 1000 mg via INTRAVENOUS
  Filled 2014-05-13: qty 200

## 2014-05-13 MED ORDER — EPHEDRINE SULFATE 50 MG/ML IJ SOLN
INTRAMUSCULAR | Status: DC | PRN
  Administered 2014-05-13: 10 mg via INTRAVENOUS
  Administered 2014-05-13: 5 mg via INTRAVENOUS

## 2014-05-13 MED ORDER — LIDOCAINE HCL (CARDIAC) 20 MG/ML IV SOLN
INTRAVENOUS | Status: AC
  Filled 2014-05-13: qty 5

## 2014-05-13 MED ORDER — PHENYLEPHRINE 40 MCG/ML (10ML) SYRINGE FOR IV PUSH (FOR BLOOD PRESSURE SUPPORT)
PREFILLED_SYRINGE | INTRAVENOUS | Status: AC
  Filled 2014-05-13: qty 10

## 2014-05-13 MED ORDER — SODIUM CHLORIDE 0.9 % IV SOLN
INTRAVENOUS | Status: DC | PRN
  Administered 2014-05-13: 08:00:00 via INTRAVENOUS

## 2014-05-13 SURGICAL SUPPLY — 54 items
BAG DECANTER FOR FLEXI CONT (MISCELLANEOUS) ×2 IMPLANT
BENZOIN TINCTURE PRP APPL 2/3 (GAUZE/BANDAGES/DRESSINGS) IMPLANT
BLADE SURG 10 STRL SS (BLADE) ×2 IMPLANT
BLADE SURG 11 STRL SS (BLADE) IMPLANT
BLADE SURG 15 STRL LF DISP TIS (BLADE) ×1 IMPLANT
BLADE SURG 15 STRL SS (BLADE) ×1
BLADE SURG ROTATE 9660 (MISCELLANEOUS) IMPLANT
CANISTER SUCT 3000ML (MISCELLANEOUS) ×2 IMPLANT
CONT SPEC 4OZ CLIKSEAL STRL BL (MISCELLANEOUS) ×2 IMPLANT
CORDS BIPOLAR (ELECTRODE) ×2 IMPLANT
DEPRESSOR TONGUE BLADE STERILE (MISCELLANEOUS) ×2 IMPLANT
DERMABOND ADHESIVE PROPEN (GAUZE/BANDAGES/DRESSINGS) ×1
DERMABOND ADVANCED (GAUZE/BANDAGES/DRESSINGS) ×2
DERMABOND ADVANCED .7 DNX12 (GAUZE/BANDAGES/DRESSINGS) ×2 IMPLANT
DERMABOND ADVANCED .7 DNX6 (GAUZE/BANDAGES/DRESSINGS) ×1 IMPLANT
DRAPE EXTREMITY T 121X128X90 (DRAPE) ×2 IMPLANT
DRAPE INCISE IOBAN 66X45 STRL (DRAPES) IMPLANT
GAUZE SPONGE 4X4 12PLY STRL (GAUZE/BANDAGES/DRESSINGS) ×2 IMPLANT
GAUZE SPONGE 4X4 16PLY XRAY LF (GAUZE/BANDAGES/DRESSINGS) ×2 IMPLANT
GLOVE BIO SURGEON STRL SZ7 (GLOVE) ×4 IMPLANT
GLOVE BIOGEL PI IND STRL 7.0 (GLOVE) ×1 IMPLANT
GLOVE BIOGEL PI IND STRL 8 (GLOVE) ×1 IMPLANT
GLOVE BIOGEL PI INDICATOR 7.0 (GLOVE) ×1
GLOVE BIOGEL PI INDICATOR 8 (GLOVE) ×1
GLOVE ECLIPSE 7.5 STRL STRAW (GLOVE) ×2 IMPLANT
GLOVE EXAM NITRILE LRG STRL (GLOVE) IMPLANT
GLOVE EXAM NITRILE MD LF STRL (GLOVE) IMPLANT
GLOVE EXAM NITRILE XL STR (GLOVE) IMPLANT
GLOVE EXAM NITRILE XS STR PU (GLOVE) IMPLANT
GLOVE SURG SS PI 7.0 STRL IVOR (GLOVE) ×6 IMPLANT
GOWN STRL REUS W/ TWL LRG LVL3 (GOWN DISPOSABLE) IMPLANT
GOWN STRL REUS W/ TWL XL LVL3 (GOWN DISPOSABLE) ×2 IMPLANT
GOWN STRL REUS W/TWL 2XL LVL3 (GOWN DISPOSABLE) IMPLANT
GOWN STRL REUS W/TWL LRG LVL3 (GOWN DISPOSABLE)
GOWN STRL REUS W/TWL XL LVL3 (GOWN DISPOSABLE) ×2
KIT BASIN OR (CUSTOM PROCEDURE TRAY) ×2 IMPLANT
KIT ROOM TURNOVER OR (KITS) ×2 IMPLANT
LOCATOR NERVE 3 VOLT (DISPOSABLE) IMPLANT
MARKER SKIN DUAL TIP RULER LAB (MISCELLANEOUS) ×2 IMPLANT
NEEDLE HYPO 25X1 1.5 SAFETY (NEEDLE) ×2 IMPLANT
NS IRRIG 1000ML POUR BTL (IV SOLUTION) ×2 IMPLANT
PACK SURGICAL SETUP 50X90 (CUSTOM PROCEDURE TRAY) ×2 IMPLANT
SPECIMEN JAR SMALL (MISCELLANEOUS) IMPLANT
STOCKINETTE 4X48 STRL (DRAPES) ×2 IMPLANT
STRIP CLOSURE SKIN 1/4X4 (GAUZE/BANDAGES/DRESSINGS) ×2 IMPLANT
SUT VIC AB 2-0 CP2 18 (SUTURE) ×2 IMPLANT
SUT VIC AB 3-0 SH 8-18 (SUTURE) ×4 IMPLANT
SYR BULB 3OZ (MISCELLANEOUS) ×2 IMPLANT
SYR CONTROL 10ML LL (SYRINGE) ×2 IMPLANT
SYR TB 1ML 25GX5/8 (SYRINGE) IMPLANT
TOWEL OR 17X24 6PK STRL BLUE (TOWEL DISPOSABLE) ×2 IMPLANT
TOWEL OR 17X26 10 PK STRL BLUE (TOWEL DISPOSABLE) ×2 IMPLANT
TUBE CONNECTING 12X1/4 (SUCTIONS) ×2 IMPLANT
WATER STERILE IRR 1000ML POUR (IV SOLUTION) ×2 IMPLANT

## 2014-05-13 NOTE — Op Note (Signed)
05/13/2014  9:24 AM  PATIENT:  [name]  68 y.o. female  PRE-OPERATIVE DIAGNOSIS:  peripheral neuropathy  POST-OPERATIVE DIAGNOSIS:  Peripheral neuropathy  PROCEDURE:  Procedure(s):  Right sural nerve biopsy  SURGEON:  Surgeon(s): Hosie Spangle, MD  ANESTHESIA:   general  EBL:  Total I/O In: 500 [I.V.:500] Out: -   BLOOD ADMINISTERED:none  COUNT: Correct per nursing staff  SPECIMEN:  Right sural nerve for light and electronically microscopy  DICTATION: Patient is brought to the operating room, placed under general endotracheal anesthesia. Patient was turned to a left side down lateral decubitus position, and the right lower extremity was prepped with Betadine soap and solution and draped in a sterile fashion. The lateral aspect of the distal right leg and right ankle was exposed, and a vertical incision was made in the posterior lateral aspect, midway between the posterior aspect of the lateral malleolus, and the lateral aspect of the Achilles tendon. The line of the incision was infiltrated with local anesthetic with epinephrine. Dissection was carried down through subcutaneous tissue, and the superficial vein was identified, and then subsequently the sural nerve. Small branches of the vein were coagulated and divided, it was mobilized away, so as to better expose the sural nerve. We then identified the distal end of the sural nerve within the exposure, and the nerve was cut with a 15 scalpel. We then identified the proximal end of the sural nerve within the exposure, and the nerve was again cut with a 15 scalpel. The nerve was placed in a saline moistened gauze sponge, and sent immediately to pathology for processing for light and electron microscopy. Hemostasis was established with bipolar cautery. The subcutaneous and subcuticular closed with interrupted inverted 2-0 and 3-0 undyed Vicryl sutures, and the skin edges were approximated with Dermabond. Following surgery the  patient is to be reversed from the anesthetic, extubated, and transferred to the recovery room for further care.  PLAN OF CARE: Discharge to home after PACU  PATIENT DISPOSITION:  PACU - hemodynamically stable.   Delay start of Pharmacological VTE agent (>24hrs) due to surgical blood loss or risk of bleeding:  yes

## 2014-05-13 NOTE — H&P (Signed)
Subjective: Patient is a 68 y.o. female who is admitted for a sural nerve biopsy. Patient has been under treatment for breast cancer since June 2014, and with her chemotherapy developed diminished sensation in her distal extremities, including the hands and feet.  She's been undergoing workup and treatment for peripheral neuropathy with Dr. Marcial Pacas, who has requested a sural nerve biopsy.   Patient Active Problem List   Diagnosis Date Noted  . 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 Gait abnormality 01/26/2014  . Unspecified hereditary and idiopathic peripheral neuropathy 01/26/2014  . Neuropathy due to chemotherapeutic drug 01/20/2014  . Neuropathic pain 01/20/2014  . Anxiety 01/20/2014  . Unspecified deficiency anemia 01/20/2014  . Hx of neutropenia 08/04/2013  . Jaw pain 08/04/2013  . Leukocytosis 04/27/2013  . Pericardial effusion 04/27/2013  . Cancer of central portion of female breast 02/04/2013  . Need for influenza vaccination 11/29/2011  . Hypertriglyceridemia 03/07/2011  . Post-menopausal 03/07/2011  . Anemia 12/17/2010  . Heart murmur 12/17/2010  . Seasonal allergies 12/17/2010  . HYPOKALEMIA 02/19/2009  . Essential hypertension, benign 04/09/2008   Past Medical History  Diagnosis Date  . Heart murmur   . Hypertension   . Anemia   . Allergy   . Migraines   . History of blood transfusion     "related to chemo/breast cancer" (09/29/2013)  . GERD (gastroesophageal reflux disease)   . Breast cancer   . Arthritis     "joints" (09/29/2013)  . TMJ (temporomandibular joint syndrome)     "left; just dx'd" (09/29/2013  . Radiation 11/05/13-12/24/13    Right breast     Past Surgical History  Procedure Laterality Date  . Portacath placement N/A 03/10/2013    Procedure: INSERTION PORT-A-CATH WITH FLUOROSCOPY AND ULTRASOUND;  Surgeon: Adin Hector, MD;  Location: [facility];  Service: General;  Laterality: N/A;  . Axillary lymph node biopsy Right 03/10/2013   Procedure: AXILLARY LYMPH NODE BIOPSY;  Surgeon: Adin Hector, MD;  Location: [place];  Service: General;  Laterality: Right;  sentinel node with blue dye  . Esophagogastroduodenoscopy N/A 04/28/2013    Procedure: ESOPHAGOGASTRODUODENOSCOPY (EGD);  Surgeon: Wonda Horner, MD;  Location: [facility];  Service: Endoscopy;  Laterality: N/A;  . Colonoscopy N/A 04/30/2013    Procedure: COLONOSCOPY;  Surgeon: Wonda Horner, MD;  Location: [facility];  Service: Endoscopy;  Laterality: N/A;  . Port-a-cath removal  09/29/2013  . Breast lumpectomy Right 09/29/2013    needle localization w/axillary LND/notes 09/29/2013  . Total abdominal hysterectomy      With bilateral salpingo-oophorectomy  . Tonsillectomy    . Mastectomy Right 09/29/2013    "partial"  . Breast lumpectomy with needle localization and axillary lymph node dissection Right 09/29/2013    Procedure: RIGHT BREAST NEEDLE LOCALIZED LUMPECTOMY AND AXILLARY LYMPH NODE DISSECTION;  Surgeon: Adin Hector, MD;  Location: [place];  Service: General;  Laterality: Right;  . Port-a-cath removal Left 09/29/2013    Procedure: REMOVAL PORT-A-CATH;  Surgeon: Adin Hector, MD;  Location: [place];  Service: General;  Laterality: Left;    Prescriptions prior to admission  Medication Sig Dispense Refill  . acetaminophen (TYLENOL) 500 MG tablet Take 1,000 mg by mouth every 6 (six) hours as needed for mild pain.      . B Complex-C (B-COMPLEX WITH VITAMIN C) tablet Take 1 tablet by mouth daily.      Marland Kitchen gabapentin (NEURONTIN) 300 MG capsule Take 600 mg by mouth 3 (three) times daily.      Marland Kitchen  levothyroxine (SYNTHROID, LEVOTHROID) 75 MCG tablet Take 75 mcg by mouth daily before breakfast.      . LORazepam (ATIVAN) 0.5 MG tablet Take 0.5 mg by mouth at bedtime as needed for anxiety or sleep.      Marland Kitchen ondansetron (ZOFRAN) 8 MG tablet Take 8 mg by mouth every 8 (eight) hours as needed for nausea.       Marland Kitchen OVER THE COUNTER MEDICATION Take 15 mLs by mouth daily. MIRACLE 2000  MULTIVITE LIQUID      . pantoprazole (PROTONIX) 40 MG tablet Take 40 mg by mouth daily.      Marland Kitchen PRESCRIPTION MEDICATION 1 each by Intravenous (Continuous Infusion) route every 30 (thirty) days. FOR NEUROPATHY= MONTHLY INFUSION GIVEN BY A HOME HEALTH NURSE FOR 2 DAYS AT THE END OF THE MONTH      . traMADol (ULTRAM) 50 MG tablet Take 50 mg by mouth every 6 (six) hours as needed for moderate pain.      Marland Kitchen triamterene-hydrochlorothiazide (MAXZIDE-25) 37.5-25 MG per tablet Take 0.5 tablets by mouth every other day.       Allergies  Allergen Reactions  . Penicillins Rash    History  Substance Use Topics  . Smoking status: Former Smoker -- 1 years    Types: Cigarettes  . Smokeless tobacco: Never Used     Comment: smoked in college 1 yr   . Alcohol Use: No     Comment: Wine occasionally    Family History  Problem Relation Age of Onset  . Cancer Mother 4    Unknown type of cancer  . Congestive Heart Failure Mother   . Colon cancer Brother   . Diabetes Brother 29  . Heart disease Brother     AMI 05/22/2012  . Congestive Heart Failure Brother   . Cancer Maternal Grandmother 24    Unknown type of cancer     Review of Systems A comprehensive review of systems was negative.  Objective: Vital signs in last 24 hours: Temp:  [97.5 F (36.4 C)-98.3 F (36.8 C)] 97.5 F (36.4 C) (08/19 0636) Pulse Rate:  [82-88] 82 (08/19 0636) Resp:  [20] 20 (08/19 0636) BP: (131-152)/(69-70) 152/70 mmHg (08/19 0636) SpO2:  [99 %] 99 % (08/19 0636) Weight:  [66.225 kg (146 lb)-66.633 kg (146 lb 14.4 oz)] 66.225 kg (146 lb) (08/19 0636)  EXAM:  Patient is a well-developed well-nourished black female in no acute distress.   Lungs are clear to auscultation, she has distant breath sounds.  Heart has a regular rate and rhythm, normal S1 and S2.  Extremity examinaton shows mild edema, but no clubbing or cyanosis.  Neurologic examination shows an motor exam strength is 5/5 in the deltoid, biceps, and triceps  bilaterally.  However the intrinsics are 2-3 bilaterally, and the grips are 3-4 minus Smiley.  There is notable atrophy in the hands and some clawing.  In the lower extremities the iliopsoas and quadriceps are 5 bilaterally.  The left dorsiflexor is 0-1, the right wrist flexors 1-2.  The EHL is absent bilaterally (0/5) and the plantar flexors are 4/5 bilaterally.  Sensation is decreased to pinprick in the feet, most so in the lateral aspect of the right foot.  Reflexes are absent in the biceps, brachial radialis, triceps, quadriceps, and gastrocnemii.  They're symmetrical bilaterally.  Toes are weakly downgoing bilaterally.  Gait and stance is mildly unsteady.  She does use a rolling walker with 5 inch wheels.  Data Review:CBC    Component Value Date/Time  WBC 6.6 05/12/2014 1238   WBC 6.4 03/23/2014 1039   WBC 7.6 05/30/2012 1819   RBC 3.06* 05/12/2014 1238   RBC 3.17* 03/23/2014 1039   RBC 3.21* 05/30/2012 1819   RBC 3.76* 03/21/2008 1530   HGB 10.3* 05/12/2014 1238   HGB 10.6* 03/23/2014 1039   HGB 9.8* 05/30/2012 1819   HCT 30.4* 05/12/2014 1238   HCT 31.7* 03/23/2014 1039   HCT 31.7* 05/30/2012 1819   PLT 245 05/12/2014 1238   PLT 246 03/23/2014 1039   MCV 99.3 05/12/2014 1238   MCV 100.0 03/23/2014 1039   MCV 98.6* 05/30/2012 1819   MCH 33.7 05/12/2014 1238   MCH 33.4 03/23/2014 1039   MCH 30.5 05/30/2012 1819   MCHC 33.9 05/12/2014 1238   MCHC 33.4 03/23/2014 1039   MCHC 30.9* 05/30/2012 1819   RDW 14.9 05/12/2014 1238   RDW 15.5* 03/23/2014 1039   LYMPHSABS 2.4 03/23/2014 1039   LYMPHSABS 2.3 09/16/2013 1000   MONOABS 0.4 03/23/2014 1039   MONOABS 0.9 09/16/2013 1000   EOSABS 0.2 03/23/2014 1039   EOSABS 0.1 09/16/2013 1000   BASOSABS 0.0 03/23/2014 1039   BASOSABS 0.1 09/16/2013 1000                          BMET    Component Value Date/Time   NA 140 05/12/2014 1238   NA 141 03/23/2014 1039   K 3.4* 05/12/2014 1238   K 3.4* 03/23/2014 1039   CL 100 05/12/2014 1238   CL 100 03/13/2013 0918   CO2 27  05/12/2014 1238   CO2 29 03/23/2014 1039   GLUCOSE 107* 05/12/2014 1238   GLUCOSE 123 03/23/2014 1039   GLUCOSE 169* 03/13/2013 0918   BUN 18 05/12/2014 1238   BUN 11.8 03/23/2014 1039   CREATININE 0.82 05/12/2014 1238   CREATININE 1.1 03/23/2014 1039   CREATININE 0.94 11/29/2011 1535   CALCIUM 9.7 05/12/2014 1238   CALCIUM 9.9 03/23/2014 1039   GFRNONAA 73* 05/12/2014 1238   GFRNONAA 64 11/29/2011 1535   GFRAA 85* 05/12/2014 1238   GFRAA 74 11/29/2011 1535     Assessment/Plan: Patient with peripheral neuropathy, undergoing treatment for breast cancer, for whom a sural nerve biopsy has been requested,and the patient is admitted for such.  I discussed with the patient and her daughter the nature of the procedure and its risks, including risks of infection, bleeding, anesthetic consultations of myocardial infarction, stroke, pneumonia, and death.  We also disussed that if there is sensation in the posterior lateral aspect of the foot it will be lost following the nerve biopsy.  Understandable that the patient wants to proceed with the biopsy and is admitted for such.   Hosie Spangle, MD 05/13/2014 7:50 AM

## 2014-05-13 NOTE — Anesthesia Postprocedure Evaluation (Signed)
  Anesthesia Post-op Note  Patient: Tabitha Ramirez  Procedure(s) Performed: Procedure(s) with comments: SURAL NERVE BIOPSY (Right) - Right sural nerve biopsy  Patient Location: PACU  Anesthesia Type:General  Level of Consciousness: awake, alert , oriented and patient cooperative  Airway and Oxygen Therapy: Patient Spontanous Breathing  Post-op Pain: mild  Post-op Assessment: Post-op Vital signs reviewed, Patient's Cardiovascular Status Stable, Respiratory Function Stable, Patent Airway, No signs of Nausea or vomiting and Pain level controlled  Post-op Vital Signs: stable  Last Vitals:  Filed Vitals:   05/13/14 1000  BP:   Pulse: 80  Temp:   Resp: 15    Complications: No apparent anesthesia complications

## 2014-05-13 NOTE — Anesthesia Preprocedure Evaluation (Signed)
Anesthesia Evaluation  Patient identified by MRN, date of birth, ID band Patient awake    Reviewed: Allergy & Precautions, H&P , NPO status , Patient's Chart, lab work & pertinent test results  Airway       Dental   Pulmonary former smoker,          Cardiovascular hypertension,     Neuro/Psych  Headaches, Anxiety  Neuromuscular disease    GI/Hepatic GERD-  ,  Endo/Other    Renal/GU      Musculoskeletal   Abdominal   Peds  Hematology  (+) anemia ,   Anesthesia Other Findings   Reproductive/Obstetrics                           Anesthesia Physical Anesthesia Plan  ASA: II  Anesthesia Plan: General   Post-op Pain Management:    Induction: Intravenous  Airway Management Planned: LMA  Additional Equipment:   Intra-op Plan:   Post-operative Plan: Extubation in OR  Informed Consent: I have reviewed the patients History and Physical, chart, labs and discussed the procedure including the risks, benefits and alternatives for the proposed anesthesia with the patient or authorized representative who has indicated his/her understanding and acceptance.     Plan Discussed with: CRNA, Anesthesiologist and Surgeon  Anesthesia Plan Comments:         Anesthesia Quick Evaluation

## 2014-05-13 NOTE — Discharge Instructions (Signed)

## 2014-05-13 NOTE — Anesthesia Procedure Notes (Signed)
Procedure Name: LMA Insertion Date/Time: 05/13/2014 8:35 AM Performed by: Izora Gala Pre-anesthesia Checklist: Patient identified, Emergency Drugs available, Suction available and Patient being monitored Patient Re-evaluated:Patient Re-evaluated prior to inductionOxygen Delivery Method: Circle system utilized Preoxygenation: Pre-oxygenation with 100% oxygen Intubation Type: IV induction Ventilation: Mask ventilation without difficulty LMA: LMA inserted LMA Size: 3.0 Grade View: Grade III Number of attempts: 1

## 2014-05-13 NOTE — Transfer of Care (Signed)
Immediate Anesthesia Transfer of Care Note  Patient: Tabitha Ramirez  Procedure(s) Performed: Procedure(s) with comments: SURAL NERVE BIOPSY (Right) - Right sural nerve biopsy  Patient Location: PACU  Anesthesia Type:General  Level of Consciousness: sedated and responds to stimulation  Airway & Oxygen Therapy: Patient Spontanous Breathing and Patient connected to nasal cannula oxygen  Post-op Assessment: Report given to PACU RN and Post -op Vital signs reviewed and stable  Post vital signs: Reviewed and stable  Complications: No apparent anesthesia complications

## 2014-05-14 ENCOUNTER — Encounter (HOSPITAL_COMMUNITY): Payer: Self-pay | Admitting: Neurosurgery

## 2014-05-22 ENCOUNTER — Encounter (HOSPITAL_COMMUNITY): Payer: Self-pay

## 2014-06-05 ENCOUNTER — Telehealth: Payer: Self-pay | Admitting: Hematology and Oncology

## 2014-06-05 NOTE — Telephone Encounter (Signed)
returned pt call from VM today-wanted to know appt time & date-gave pt appt time & date-*pt understood

## 2014-06-11 ENCOUNTER — Encounter: Payer: Self-pay | Admitting: Neurology

## 2014-06-11 ENCOUNTER — Ambulatory Visit (INDEPENDENT_AMBULATORY_CARE_PROVIDER_SITE_OTHER): Payer: Medicare Other | Admitting: Neurology

## 2014-06-11 VITALS — BP 110/67 | HR 92 | Ht 64.0 in | Wt 146.0 lb

## 2014-06-11 DIAGNOSIS — IMO0002 Reserved for concepts with insufficient information to code with codable children: Secondary | ICD-10-CM

## 2014-06-11 DIAGNOSIS — R269 Unspecified abnormalities of gait and mobility: Secondary | ICD-10-CM

## 2014-06-11 DIAGNOSIS — M792 Neuralgia and neuritis, unspecified: Secondary | ICD-10-CM

## 2014-06-11 DIAGNOSIS — G609 Hereditary and idiopathic neuropathy, unspecified: Secondary | ICD-10-CM

## 2014-06-11 DIAGNOSIS — G589 Mononeuropathy, unspecified: Secondary | ICD-10-CM

## 2014-06-11 DIAGNOSIS — G629 Polyneuropathy, unspecified: Secondary | ICD-10-CM

## 2014-06-11 NOTE — Progress Notes (Signed)
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 Her primary care physician is Dr. Willis Modena her oncologist is Dr. Jana Hakim  She was diagnosed with clinical T2 NX, stage II invasive ductal carcinoma, grade 3, triple negative, with an MIB-1 of 84% , underwent right lumpectomy and right axillary lymph node dissection on 09/29/2012. The pathology from that procedure ([id number]) showed an area of approximately 1.1 cm showing residual invasive nests and cells of ductal carcinoma, grade 2, but mostly posttreatment effect. Repeat estrogen and progesterone receptors were again negative. All 11 lymph nodes were benign. Margins were ample.  She was started on chemotherapy, neoadjuvant doxorubicin and cyclophosphamide being given in dose dense fashion with Neulasta support starting 03/17/2013, followed by weekly carboplatin and paclitaxel x12, with final cycle 08/25/2013. She has developed chemotherapy-induced anemia, Status post blood transfusion, 2 units of packed red blood cells, on 06/10/2013 and again on 12-14.   This is followed by radiation therapy, from Feb 2015, completed 12/24/2013   She complains of bilateral feet and hands paresthesia around November 2014, close to the end of her chemotherapy, gradul onset, progressively worse,  She began to use walker in March 2015, despite stopping chemotherapy in December 2014, she continued to get worse, per patient "day by day", she now complains of tingling sensation, burning sensation in her feet, sand paper sensation in her hands, could no longer hold objects. Last time she still was in February 2015 no longer step on  gas pedal, or holding steer wheel.  She denies bowel bladder incontinence, She denies neck, low back pain,  She was started on gabapentin 300 mg qid  since April 2015, which did help her neuropathic pain, 60% improvement, tradamol was helpful too  She continued to have worsening anemia too, decreasing from 10.2 in February 2015 to 9.2.    Recent laboratory evaluation showed normal CMP with exception of decreased albumin 3 point 2, anemia, with hemoglobin 9 point 2, hematocrit 27 point 4 RDW 15 point 2, normal folic acid 9,6 N17 001, ferritin 937.   UPDATE June 15th 2015; Since her initial visit in May  4th 2015,  EMG nerve conduction study May 14th 2015, which has demonstrates severe peripheral neuropathy, with mixed demyelinating, and axonal features, she has absent sensory responses in upper and lower extremity, absent bilateral lower extremity motor response, and ulnar motor response, right median motor responses,showed prolonged distal latency, F waves latency, conduction velocity of 34 m/s, in the setting of severely decreased C. map amplitude. Spontaneous activity, active neuropathic changes involving lower extremity muscles, and distal upper extremity muscles  CSF showed WBC 0, RBC 0, glucose 65, total protein 125 significantly elevated,  Laboratory evaluation showed elevated TSH 15.9, free T4, mildly low 4.3, normal or negative HIV, RPR, ANA, V49 449, folic acid 9.6, elevated MMA 947, Thiamine, elevated ESR,74, CRP ,   MRI of the brain showed moderate periventricular white matter disease, cervical, thoracic, lumbar spine showed degenerative disc disease, but there was no significant canal, or foraminal stenosis,  She is taking gabapentin 300 mg 2 tablets 3 times a day, which has helped her neuropathic pain, but cause frequent urination,  UPDATE Sep 17th 2015: She began to have [facility] infusion [facility] since July 28th 2015, 2g/kg over 5 days, then monthly for total of 5 treatment,  She reported 40 percent improvement after IVIG infusion, she felt stronger, she can walk better, she has much less neuropathic pain, she used  to take gabapentin 600 mg 3 times a day, now is able to cut back to 300 mg every night.    She denies significant side effects from IV infusion, continued to have bilateral hands and distal leg weakness.  I have reviewed nerve biopsy result May 13 2014, the nerve fascicles are remarkable for moderately severe loss of myelinated fibers, teased nerve fiber preparation shows a single linear array of myelin ovoids consistent with a degenerating myelinated fibers. There was no evidence of vasculitis, inflammatory neuropathy, no amyloid deposition, no specific findings for demyelinating neuropathy.  REVIEW OF SYSTEMS: Full 14 system review of systems performed and notable only for runny nose, incontinence of bladder,  ALLERGIES: Allergies  Allergen Reactions  . Penicillins Rash    HOME MEDICATIONS: Current Outpatient Prescriptions on File Prior to Visit  Medication Sig Dispense Refill  . Acetaminophen (TYLENOL EXTRA STRENGTH PO) Take 2 tablets by mouth daily as needed (for pain).       Marland Kitchen gabapentin (NEURONTIN) 300 MG capsule Take 1 capsule (300 mg total) by mouth 4 (four) times daily. Meals and bedtime  120 capsule  2  . hyaluronate sodium (RADIAPLEXRX) GEL Apply 1 application topically 2 (two) times daily.      Marland Kitchen LORazepam (ATIVAN) 0.5 MG tablet Take 1 tablet (0.5 mg total) by mouth at bedtime as needed for anxiety or sleep.  30 tablet  0  . Multiple Vitamin (MULTIVITAMIN WITH MINERALS) TABS Take 1 tablet by mouth daily. LIIQUID - Takes 1 cap full daily      . ondansetron (ZOFRAN) 8 MG tablet Take 8 mg by mouth every 8 (eight) hours as needed for nausea.       . pantoprazole (PROTONIX) 40 MG tablet Take 1 tablet (40 mg total) by mouth daily at 12 noon.  30 tablet  2  . prochlorperazine (COMPAZINE) 10 MG tablet Take 10 mg by mouth every 6 (six) hours as needed for nausea or vomiting.      . traMADol (ULTRAM) 50 MG tablet Take 1 tablet (50 mg total) by mouth every 6 (six) hours as needed for  moderate pain.  90 tablet  0  . triamterene-hydrochlorothiazide (MAXZIDE-25) 37.5-25 MG per tablet Take 0.5 tablets by mouth daily.         PAST MEDICAL HISTORY: Past Medical History  Diagnosis Date  . Heart murmur   . Hypertension   . Anemia   . Allergy   . Migraines   . History of blood transfusion     "related to chemo/breast cancer" (09/29/2013)  . GERD (gastroesophageal reflux disease)   . Breast cancer   . Arthritis     "joints" (09/29/2013)  . TMJ (temporomandibular joint syndrome)     "left; just dx'd" (09/29/2013    PAST SURGICAL HISTORY: Past Surgical History  Procedure Laterality Date  . Portacath placement N/A 03/10/2013    Procedure: INSERTION PORT-A-CATH WITH FLUOROSCOPY AND ULTRASOUND;  Surgeon: Adin Hector, MD;  Location: [facility];  Service: General;  Laterality: N/A;  . Axillary lymph node biopsy Right 03/10/2013    Procedure: AXILLARY LYMPH NODE BIOPSY;  Surgeon: Adin Hector, MD;  Location: [facility];  Service: General;  Laterality: Right;  sentinel node with blue dye  . Esophagogastroduodenoscopy N/A 04/28/2013    Procedure: ESOPHAGOGASTRODUODENOSCOPY (EGD);  Surgeon: Wonda Horner, MD;  Location: [facility];  Service: Endoscopy;  Laterality: N/A;  . Colonoscopy N/A 04/30/2013    Procedure: COLONOSCOPY;  Surgeon: Wonda Horner, MD;  Location: [facility];  Service: Endoscopy;  Laterality: N/A;  . Port-a-cath removal  09/29/2013  . Breast lumpectomy Right 09/29/2013    needle localization w/axillary LND/notes 09/29/2013  . Total abdominal hysterectomy      With bilateral salpingo-oophorectomy  . Tonsillectomy    . Mastectomy Right 09/29/2013    "partial"  . Breast lumpectomy with needle localization and axillary lymph node dissection Right 09/29/2013    Procedure: RIGHT BREAST NEEDLE LOCALIZED LUMPECTOMY AND AXILLARY LYMPH NODE DISSECTION;  Surgeon: Adin Hector, MD;  Location: [facility];  Service: General;  Laterality: Right;  . Port-a-cath removal Left 09/29/2013     Procedure: REMOVAL PORT-A-CATH;  Surgeon: Adin Hector, MD;  Location: [facility];  Service: General;  Laterality: Left;    FAMILY HISTORY: Family History  Problem Relation Age of Onset  . Breast Cancer Mother 53  . Congestive Heart Failure Mother   . Colon cancer Brother   . Diabetes Brother 11  . Heart disease Brother     AMI 05/22/2012  . Congestive Heart Failure Brother   . Breast Cancer Maternal Grandmother 55    SOCIAL HISTORY:  History   Social History  . Marital Status: Divorced    Spouse Name: n/a    Number of Children: 1  . Years of Education: 17   Occupational History  . retired     Sports coach  . subsitute     Social History Main Topics  . Smoking status: Former Smoker -- 1 years    Types: Cigarettes  . Smokeless tobacco: Never Used     Comment: smoked in college 1 yr   . Alcohol Use: No     Comment: Wine occasionally  . Drug Use: No  . Sexual Activity: Not Currently    Partners: Male    Birth Control/ Protection: Surgical   Other Topics Concern  . Not on file   Social History Narrative   Retired, worked for [employer] system in [employer] in 7/08. Currently work as Oceanographer     PHYSICAL EXAM   Filed Vitals:   06/11/14 1057  BP: 110/67  Pulse: 92  Height: 5' 4"  (1.626 m)  Weight: 146 lb (66.225 kg)   Body mass index is 25.05 kg/(m^2).   Generalized: In no acute distress  Neck: Supple, no carotid bruits   Cardiac: Regular rate rhythm  Pulmonary: Clear to auscultation bilaterally  Musculoskeletal: No deformity  Neurological examination  Mentation: Alert oriented to time, place, history taking, and causual conversation, tired looking elderly female,  Cranial nerve II-XII: Pupils were equal round reactive to light. Extraocular movements were full.  Visual field were full on confrontational test. Bilateral fundi were sharp.  Facial sensation and strength were normal. Hearing was intact to finger rubbing  bilaterally. Uvula tongue midline.  Head turning and shoulder shrug and were normal and symmetric.Tongue protrusion into cheek strength was normal.  Motor: Moderate back and hands intrinsic muscle atrophy, bilateral shoulder abduction 5, external rotation 5, elbow flexion4+, elbow extension 4, wrist flexion4-, wrist extension4-, grip 4, hip flexion 5  knee flexion 5 knee extension 5  Ankle dorsi flexion1 plantar flexion1.  Sensory: length dependent decreased  fine touch, pinprick to  wrist,  and  midshin level, absent vibratory sensation to midshin, decreased vibratory sensation at fingertips,  and absent proprioception at toes, fingers.  Coordination: Normal finger to nose, heel-to-shin bilaterally there was no truncal ataxia  Gait: need push up to get up from seated position,  wide based, unsteady, bilateral flailed gait.   Romberg signs: Negative  Deep tendon reflexes: areflexia. plantar responses were flexor bilaterally.   DIAGNOSTIC DATA (LABS, IMAGING, TESTING) - I reviewed patient records, labs, notes, testing and imaging myself where available.  Lab Results  Component Value Date   WBC 6.6 05/12/2014   HGB 10.3* 05/12/2014   HCT 30.4* 05/12/2014   MCV 99.3 05/12/2014   PLT 245 05/12/2014      Component Value Date/Time   NA 140 05/12/2014 1238   NA 141 03/23/2014 1039   K 3.4* 05/12/2014 1238   K 3.4* 03/23/2014 1039   CL 100 05/12/2014 1238   CL 100 03/13/2013 0918   CO2 27 05/12/2014 1238   CO2 29 03/23/2014 1039   GLUCOSE 107* 05/12/2014 1238   GLUCOSE 123 03/23/2014 1039   GLUCOSE 169* 03/13/2013 0918   BUN 18 05/12/2014 1238   BUN 11.8 03/23/2014 1039   CREATININE 0.82 05/12/2014 1238   CREATININE 1.1 03/23/2014 1039   CREATININE 0.94 11/29/2011 1535   CALCIUM 9.7 05/12/2014 1238   CALCIUM 9.9 03/23/2014 1039   PROT 8.2 03/23/2014 1039   PROT 7.4 01/26/2014 1057   PROT 7.2 09/16/2013 1000   ALBUMIN 3.8 03/23/2014 1039   ALBUMIN 3.6 09/16/2013 1000   AST 40* 03/23/2014 1039   AST 20  09/16/2013 1000   ALT 42 03/23/2014 1039   ALT 21 09/16/2013 1000   ALKPHOS 82 03/23/2014 1039   ALKPHOS 89 09/16/2013 1000   BILITOT 0.55 03/23/2014 1039   BILITOT 0.3 09/16/2013 1000   GFRNONAA 73* 05/12/2014 1238   GFRNONAA 64 11/29/2011 1535   GFRAA 85* 05/12/2014 1238   GFRAA 74 11/29/2011 1535   Lab Results  Component Value Date   CHOL 164 11/29/2011   HDL 43 11/29/2011   LDLCALC 87 11/29/2011   TRIG 170* 11/29/2011   CHOLHDL 3.8 11/29/2011   Lab Results  Component Value Date   HGBA1C 7.1* 04/27/2013   Lab Results  Component Value Date   VITAMINB12 623 01/20/2014   Lab Results  Component Value Date   TSH 15.960* 01/26/2014    ASSESSMENT AND PLAN  MOANI WEIPERT is a 68 y.o. female  with history of breast cancer, status post lobectomy in May 2014, followed by chemotherapy, radiation therapy, now with progressive worsening gait difficulty, distal arm and leg  muscle atrophy, profound weakness, length dependent sensory changes, areflexia.  Localized the lesion to prepheral nervous system, she has mixed demyelinating, axonal peripheral neuropathy, elevated CSF protein 125, nerve biopsy in August 2015 has demonstrate moderately severe loss of myelinated fibers, no evidence of inflammation, no evidence of vasculitis, no amyloid deposition, no specific features suggestive of demyelinating.   She reported mild to moderate improvement after IVIG treatment, she has much less neuropathic pain, only taking gabapentin 300 mg every night, she can walk better, continue had significant bilateral hands, distal leg weakness.  Complete the course of IVIG treatment, Physical therapy Return to clinic in December 2015    Marcial Pacas, M.D. Ph.D.  [facility] [address], [place], [place] [address] [phone]

## 2014-06-12 ENCOUNTER — Other Ambulatory Visit: Payer: Self-pay | Admitting: Oncology

## 2014-06-12 DIAGNOSIS — Z853 Personal history of malignant neoplasm of breast: Secondary | ICD-10-CM

## 2014-06-22 ENCOUNTER — Telehealth: Payer: Self-pay

## 2014-06-22 ENCOUNTER — Other Ambulatory Visit: Payer: Self-pay | Admitting: Hematology and Oncology

## 2014-06-22 ENCOUNTER — Other Ambulatory Visit (HOSPITAL_BASED_OUTPATIENT_CLINIC_OR_DEPARTMENT_OTHER): Payer: Medicare Other

## 2014-06-22 ENCOUNTER — Encounter: Payer: Self-pay | Admitting: Hematology and Oncology

## 2014-06-22 ENCOUNTER — Ambulatory Visit (HOSPITAL_BASED_OUTPATIENT_CLINIC_OR_DEPARTMENT_OTHER): Payer: Medicare Other | Admitting: Hematology and Oncology

## 2014-06-22 ENCOUNTER — Telehealth: Payer: Self-pay | Admitting: Hematology and Oncology

## 2014-06-22 VITALS — BP 135/70 | HR 90 | Temp 98.5°F | Resp 20 | Ht 64.0 in | Wt 146.4 lb

## 2014-06-22 DIAGNOSIS — G619 Inflammatory polyneuropathy, unspecified: Secondary | ICD-10-CM

## 2014-06-22 DIAGNOSIS — E2839 Other primary ovarian failure: Secondary | ICD-10-CM

## 2014-06-22 DIAGNOSIS — C50119 Malignant neoplasm of central portion of unspecified female breast: Secondary | ICD-10-CM

## 2014-06-22 DIAGNOSIS — Z853 Personal history of malignant neoplasm of breast: Secondary | ICD-10-CM

## 2014-06-22 DIAGNOSIS — G622 Polyneuropathy due to other toxic agents: Secondary | ICD-10-CM

## 2014-06-22 DIAGNOSIS — C50112 Malignant neoplasm of central portion of left female breast: Secondary | ICD-10-CM

## 2014-06-22 LAB — CBC WITH DIFFERENTIAL/PLATELET
BASO%: 0.8 % (ref 0.0–2.0)
Basophils Absolute: 0 10*3/uL (ref 0.0–0.1)
EOS%: 3.1 % (ref 0.0–7.0)
Eosinophils Absolute: 0.2 10*3/uL (ref 0.0–0.5)
HEMATOCRIT: 32.5 % — AB (ref 34.8–46.6)
HGB: 10.7 g/dL — ABNORMAL LOW (ref 11.6–15.9)
LYMPH%: 39.3 % (ref 14.0–49.7)
MCH: 33.6 pg (ref 25.1–34.0)
MCHC: 32.9 g/dL (ref 31.5–36.0)
MCV: 102.2 fL — AB (ref 79.5–101.0)
MONO#: 0.7 10*3/uL (ref 0.1–0.9)
MONO%: 10.7 % (ref 0.0–14.0)
NEUT#: 2.8 10*3/uL (ref 1.5–6.5)
NEUT%: 46.1 % (ref 38.4–76.8)
Platelets: 284 10*3/uL (ref 145–400)
RBC: 3.18 10*6/uL — ABNORMAL LOW (ref 3.70–5.45)
RDW: 14.7 % — AB (ref 11.2–14.5)
WBC: 6.1 10*3/uL (ref 3.9–10.3)
lymph#: 2.4 10*3/uL (ref 0.9–3.3)

## 2014-06-22 LAB — COMPREHENSIVE METABOLIC PANEL (CC13)
ALT: 21 U/L (ref 0–55)
AST: 26 U/L (ref 5–34)
Albumin: 3.3 g/dL — ABNORMAL LOW (ref 3.5–5.0)
Alkaline Phosphatase: 92 U/L (ref 40–150)
Anion Gap: 11 mEq/L (ref 3–11)
BILIRUBIN TOTAL: 0.45 mg/dL (ref 0.20–1.20)
BUN: 17.2 mg/dL (ref 7.0–26.0)
CO2: 27 mEq/L (ref 22–29)
CREATININE: 1 mg/dL (ref 0.6–1.1)
Calcium: 9.7 mg/dL (ref 8.4–10.4)
Chloride: 105 mEq/L (ref 98–109)
Glucose: 119 mg/dl (ref 70–140)
Potassium: 3.3 mEq/L — ABNORMAL LOW (ref 3.5–5.1)
Sodium: 143 mEq/L (ref 136–145)
Total Protein: 9 g/dL — ABNORMAL HIGH (ref 6.4–8.3)

## 2014-06-22 MED ORDER — LORAZEPAM 0.5 MG PO TABS: 0.5000 mg | ORAL_TABLET | Freq: Every evening | ORAL | Status: DC | PRN

## 2014-06-22 MED ORDER — OXYCODONE-ACETAMINOPHEN 5-325 MG PO TABS: 1.0000 | ORAL_TABLET | ORAL | Status: DC | PRN

## 2014-06-22 MED ORDER — ANASTROZOLE 1 MG PO TABS: 1.0000 mg | ORAL_TABLET | Freq: Every day | ORAL | Status: DC

## 2014-06-22 NOTE — Progress Notes (Signed)
Patient Care Team: Dellia Nims, MD as PCP - Maloy, MD as Consulting Physician (Oncology)  DIAGNOSIS: Cancer of central portion of female breast   Primary site: Breast (Right)   Staging method: AJCC 7th Edition   Clinical: Stage IIB (T2, N1, cM0) signed by Thea Silversmith, MD on 10/24/2013  5:04 PM   Summary: Stage IIB (T2, N1, cM0)   Clinical comments: Staged at breast conference 5.21.14   SUMMARY OF ONCOLOGIC HISTORY: Oncology History   Dose dense Adriamycin Cytoxan     Cancer of central portion of female breast   01/31/2013 Mammogram Area of distortion upper-outer quadrant right breast diffuse calcifications in both breasts 2.5 cm with prominent right axillary lymph node 2.5 cm   01/31/2013 Initial Diagnosis Cancer of central portion of female breast: 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 Ki-67 84%; sentinel lymph node sampling 03/10/2013 showed1/2 SLN pos PR 3% PR 6% Ki-67 15% HER-2 negative   02/10/2013 Breast MRI Right breast: Rim enhancing necrotic mass 3.4 cm with a 2 cm level I axillary lymph node   03/17/2013 - 08/25/2013 Neo-Adjuvant Chemotherapy Followed by weekly Taxol and carboplatin x12; chemotherapy-induced anemia and peripheral neuropathy were the complications   04/27/3824 Surgery Right breast lumpectomy scatter residual invasive mass 1.1 cm with [facility] 0/11 lymph nodes no cancer seen: ER 0% PR 0% insufficient to mark it on HER-2 testing   11/12/2013 - 12/24/2013 Radiation Therapy Radiation therapy to the breast and axilla    CHIEF COMPLIANT: Severe neuropathy, right breast pain  INTERVAL HISTORY: Tabitha Ramirez is a 68 year old American lady with above-mentioned history of right-sided breast cancer that was treated with neoadjuvant chemotherapy followed by surgery and radiation. She is here for a routine followup. She reports that over the past year neuropathy is becoming major problem and she is seeing neurology. They have prescribe her Neurontin which has  helped her symptoms and she is now tapering downwards and is now only taking 100 mg once a day. She requires a walker for ambulation. She is also having severe intractable right breast pains intermittently that last for about 30 minutes and then go away. She was prescribed tramadol but she reports that it is not helping her urine she requests a narcotic pain medication for pain relief.  REVIEW OF SYSTEMS:   Constitutional: Denies fevers, chills or abnormal weight loss Eyes: Denies blurriness of vision Ears, nose, mouth, throat, and face: Denies mucositis or sore throat Respiratory: Denies cough, dyspnea or wheezes Cardiovascular: Denies palpitation, chest discomfort or lower extremity swelling Gastrointestinal:  Denies nausea, heartburn or change in bowel habits Skin: Denies abnormal skin rashes Lymphatics: Denies new lymphadenopathy or easy bruising Neurological: For neuropathy right greater than left Behavioral/Psych: Mood is stable, no new changes  Breast: Right breast pain All other systems were reviewed with the patient and are negative.  I have reviewed the past medical history, past surgical history, social history and family history with the patient and they are unchanged from previous note.  ALLERGIES:  is allergic to penicillins.  MEDICATIONS:  Current Outpatient Prescriptions  Medication Sig Dispense Refill  . acetaminophen (TYLENOL) 500 MG tablet Take 1,000 mg by mouth every 6 (six) hours as needed for mild pain.      . B Complex-C (B-COMPLEX WITH VITAMIN C) tablet Take 1 tablet by mouth daily.      Marland Kitchen gabapentin (NEURONTIN) 300 MG capsule Take 300 mg by mouth at bedtime.       . IMMUNE GLOBULIN 10 GM/100ML  SOLN       . levothyroxine (SYNTHROID, LEVOTHROID) 75 MCG tablet Take 75 mcg by mouth daily before breakfast.      . LORazepam (ATIVAN) 0.5 MG tablet Take 1 tablet (0.5 mg total) by mouth at bedtime as needed for anxiety or sleep.  30 tablet  0  . ondansetron (ZOFRAN) 8 MG  tablet TAKE 1 TABLET (8 MG TOTAL) BY MOUTH EVERY 12 (TWELVE) HOURS AS NEEDED FOR NAUSEA.  20 tablet  0  . OVER THE COUNTER MEDICATION Take 15 mLs by mouth daily. MIRACLE 2000 MULTIVITE LIQUID      . pantoprazole (PROTONIX) 40 MG tablet Take 40 mg by mouth daily.      Marland Kitchen PRESCRIPTION MEDICATION 1 each by Intravenous (Continuous Infusion) route every 30 (thirty) days. FOR NEUROPATHY= MONTHLY INFUSION GIVEN BY A HOME HEALTH NURSE FOR 2 DAYS AT THE END OF THE MONTH      . traMADol (ULTRAM) 50 MG tablet Take 50 mg by mouth every 6 (six) hours as needed for moderate pain.      Marland Kitchen triamterene-hydrochlorothiazide (MAXZIDE-25) 37.5-25 MG per tablet Take 0.5 tablets by mouth every other day.      . vitamin B-12 (CYANOCOBALAMIN) 1000 MCG tablet Take 1,000 mcg by mouth daily.      Marland Kitchen anastrozole (ARIMIDEX) 1 MG tablet Take 1 tablet (1 mg total) by mouth daily.  30 tablet  11  . oxyCODONE-acetaminophen (PERCOCET/ROXICET) 5-325 MG per tablet Take 1 tablet by mouth every 4 (four) hours as needed for severe pain.  90 tablet  0   No current facility-administered medications for this visit.    PHYSICAL EXAMINATION: ECOG PERFORMANCE STATUS: 2 - Symptomatic, <50% confined to bed  Filed Vitals:   06/22/14 1122  BP: 135/70  Pulse: 90  Temp: 98.5 F (36.9 C)  Resp: 20   Filed Weights   06/22/14 1122  Weight: 146 lb 6.4 oz (66.407 kg)    GENERAL:alert, no distress and comfortable SKIN: skin color, texture, turgor are normal, no rashes or significant lesions EYES: normal, Conjunctiva are pink and non-injected, sclera clear OROPHARYNX:no exudate, no erythema and lips, buccal mucosa, and tongue normal  NECK: supple, thyroid normal size, non-tender, without nodularity LYMPH:  no palpable lymphadenopathy in the cervical, axillary or inguinal LUNGS: clear to auscultation and percussion with normal breathing effort HEART: regular rate & rhythm and no murmurs and no lower extremity edema ABDOMEN:abdomen soft,  non-tender and normal bowel sounds Musculoskeletal:no cyanosis of digits and no clubbing  NEURO: alert & oriented x 3 with fluent speech, no focal motor/sensory deficits BREAST:No palpable masses or nodules in either right or left breasts tenderness in the breast the right-sided. No palpable axillary supraclavicular or infraclavicular adenopathy no breast tenderness or nipple discharge.   LABORATORY DATA:  I have reviewed the data as listed   Chemistry      Component Value Date/Time   NA 143 06/22/2014 1107   NA 140 05/12/2014 1238   K 3.3* 06/22/2014 1107   K 3.4* 05/12/2014 1238   CL 100 05/12/2014 1238   CL 100 03/13/2013 0918   CO2 27 06/22/2014 1107   CO2 27 05/12/2014 1238   BUN 17.2 06/22/2014 1107   BUN 18 05/12/2014 1238   CREATININE 1.0 06/22/2014 1107   CREATININE 0.82 05/12/2014 1238   CREATININE 0.94 11/29/2011 1535      Component Value Date/Time   CALCIUM 9.7 06/22/2014 1107   CALCIUM 9.7 05/12/2014 1238   ALKPHOS 92 06/22/2014 1107  ALKPHOS 89 09/16/2013 1000   AST 26 06/22/2014 1107   AST 20 09/16/2013 1000   ALT 21 06/22/2014 1107   ALT 21 09/16/2013 1000   BILITOT 0.45 06/22/2014 1107   BILITOT 0.3 09/16/2013 1000       Lab Results  Component Value Date   WBC 6.1 06/22/2014   HGB 10.7* 06/22/2014   HCT 32.5* 06/22/2014   MCV 102.2* 06/22/2014   PLT 284 06/22/2014   NEUTROABS 2.8 06/22/2014     RADIOGRAPHIC STUDIES: I have personally reviewed the radiology reports and agreed with their findings. Chest x-ray done August 2015 revealed improvement in the right upper lobe infiltrate from what appears to be radiation pneumonitis  ASSESSMENT & PLAN:  Cancer of central portion of female breast 1. 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 N0 M0 ER PR 0% HER-2 negative status post radiation therapy  I discussed with her the pathology report which showed that she  is triple negative but on the initial lymph node sampling that was the person estrogen receptor positivity. Based on the final pathology report with ER 0% PR 0%, I agree with the previous plan of not initiating anti-estrogen therapy.  2. severe peripheral neuropathy: Currently on gabapentin. 3. severe right breast pain: Patient reports that tramadol was not helping her and requests narcotic pain medication which she has taken before and has worked very well. I will prescribe Percocet 5 mg tablet when necessary #90 4. renew prescription for Ativan today as well. 5. radiation pneumonitis: The chest x-ray done in August showed improvement ON May MRI and patient is not symptomatic. Based on the previous radiologist recommendation, I would like to obtain a CT of the chest in June 2016. 6. surveillance: Mammograms to be set up for June 2016. We will see her back after that to do a breast exam.   Orders Placed This Encounter  Procedures  . MM Digital Diagnostic Bilat    Standing Status: Future     Number of Occurrences:      Standing Expiration Date: 06/22/2015    Order Specific Question:  Reason for Exam (SYMPTOM  OR DIAGNOSIS REQUIRED)    Answer:  Breast cancer Left breast annual follow up    Order Specific Question:  Preferred imaging location?    Answer:  [facility]  . DG Bone Density    Standing Status: Future     Number of Occurrences:      Standing Expiration Date: 06/22/2015    Order Specific Question:  Reason for Exam (SYMPTOM  OR DIAGNOSIS REQUIRED)    Answer:  Post menopausal on anti estrogen therapy    Order Specific Question:  Preferred imaging location?    Answer:  [facility]  . CT Chest W Contrast    Standing Status: Future     Number of Occurrences:      Standing Expiration Date: 06/22/2015    Order Specific Question:  Reason for Exam (SYMPTOM  OR DIAGNOSIS REQUIRED)    Answer:  Radiation pneumonitis evaluation    Order Specific Question:  Preferred imaging  location?    Answer:  [facility]   The patient has a good understanding of the overall plan. she agrees with it. She will call with any problems that may develop before her next visit here.  I spent 25 minutes counseling the patient face to face. The total time spent  in the appointment was 30 minutes and more than 50% was on counseling and review of test results    Rulon Eisenmenger, MD 06/22/2014 12:30 PM

## 2014-06-22 NOTE — Assessment & Plan Note (Signed)
1. 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 N0 M0 ER PR 0% HER-2 negative status post radiation therapy  I discussed with her the pathology report which showed that she is triple negative but on the initial lymph node sampling that was the person estrogen receptor positivity. Based on the final pathology report with ER 0% PR 0%, I agree with the previous plan of not initiating anti-estrogen therapy.  2. severe peripheral neuropathy: Currently on gabapentin. 3. severe right breast pain: Patient reports that tramadol was not helping her and requests narcotic pain medication which she has taken before and has worked very well. I will prescribe Percocet 5 mg tablet when necessary #90 4. renew prescription for Ativan today as well. 5. radiation pneumonitis: The chest x-ray done in August showed improvement ON May MRI and patient is not symptomatic. Based on the previous radiologist recommendation, I would like to obtain a CT of the chest in June 2016. 6. surveillance: Mammograms to be set up for June 2016. We will see her back after that to do a breast exam.

## 2014-06-22 NOTE — Telephone Encounter (Signed)
Per Dr Lindi Adie, prescription for anastrazole cancelled with Crystal at [facility].  Dr. Lindi Adie called patient to let her know not to take anastrazole.

## 2014-06-22 NOTE — Telephone Encounter (Signed)
per pof to sch pt appt-sch mamma-DEXA-adv pt CS will call to sch CT-pt understood-gave copy of sch

## 2014-06-23 ENCOUNTER — Other Ambulatory Visit: Payer: Self-pay

## 2014-06-23 DIAGNOSIS — C50119 Malignant neoplasm of central portion of unspecified female breast: Secondary | ICD-10-CM

## 2014-06-25 ENCOUNTER — Telehealth: Payer: Self-pay | Admitting: Neurology

## 2014-06-25 ENCOUNTER — Telehealth: Payer: Self-pay | Admitting: Hematology and Oncology

## 2014-06-25 NOTE — Telephone Encounter (Signed)
Called and asked Amy to refax I didn't have Dr.Yan did not have in her office.

## 2014-06-25 NOTE — Telephone Encounter (Signed)
per Vivien Rota CS to add lab for CT priot to scan-she will adv pt

## 2014-06-25 NOTE — Telephone Encounter (Signed)
Tabitha Ramirez with [facility] @ [phone], calling to check status of Initial Plan of care faxed on 04/30/14.  Please call and advise.

## 2014-06-29 ENCOUNTER — Other Ambulatory Visit: Payer: Medicare Other

## 2014-07-14 ENCOUNTER — Other Ambulatory Visit: Payer: Self-pay

## 2014-07-14 ENCOUNTER — Telehealth: Payer: Self-pay

## 2014-07-14 DIAGNOSIS — C50119 Malignant neoplasm of central portion of unspecified female breast: Secondary | ICD-10-CM

## 2014-07-14 MED ORDER — OXYCODONE-ACETAMINOPHEN 5-325 MG PO TABS: 1.0000 | ORAL_TABLET | ORAL | Status: DC | PRN

## 2014-07-14 NOTE — Telephone Encounter (Signed)
Returned call - let pt know prescription for Oxy would be ready to pick up before 4 pm.  Pt daughter Reatha Armour will pick up.  Prescription placed in book.

## 2014-07-14 NOTE — Telephone Encounter (Signed)
Returned pt call - ok to have vaccines - flu and whooping cough.  Pt voiced understanding.

## 2014-07-27 ENCOUNTER — Encounter: Payer: Self-pay | Admitting: Hematology and Oncology

## 2014-08-06 ENCOUNTER — Other Ambulatory Visit: Payer: Self-pay | Admitting: *Deleted

## 2014-08-06 ENCOUNTER — Telehealth: Payer: Self-pay | Admitting: *Deleted

## 2014-08-06 DIAGNOSIS — C50119 Malignant neoplasm of central portion of unspecified female breast: Secondary | ICD-10-CM

## 2014-08-06 MED ORDER — OXYCODONE-ACETAMINOPHEN 5-325 MG PO TABS: 1.0000 | ORAL_TABLET | ORAL | Status: DC | PRN

## 2014-08-25 ENCOUNTER — Ambulatory Visit (INDEPENDENT_AMBULATORY_CARE_PROVIDER_SITE_OTHER): Payer: Medicare Other | Admitting: Neurology

## 2014-08-25 ENCOUNTER — Encounter: Payer: Self-pay | Admitting: Neurology

## 2014-08-25 VITALS — BP 139/81 | HR 88 | Ht 64.0 in | Wt 147.8 lb

## 2014-08-25 DIAGNOSIS — G6289 Other specified polyneuropathies: Secondary | ICD-10-CM

## 2014-08-25 DIAGNOSIS — R269 Unspecified abnormalities of gait and mobility: Secondary | ICD-10-CM

## 2014-08-25 DIAGNOSIS — G629 Polyneuropathy, unspecified: Secondary | ICD-10-CM

## 2014-08-25 DIAGNOSIS — G620 Drug-induced polyneuropathy: Secondary | ICD-10-CM

## 2014-08-25 DIAGNOSIS — T451X5A Adverse effect of antineoplastic and immunosuppressive drugs, initial encounter: Principal | ICD-10-CM

## 2014-08-25 MED ORDER — GABAPENTIN 300 MG PO CAPS: 300.0000 mg | ORAL_CAPSULE | Freq: Two times a day (BID) | ORAL | Status: DC

## 2014-08-25 NOTE — Progress Notes (Signed)
PATIENT: Tabitha Ramirez DOB: 02-14-1946  HISTORICAL ( initial visit was May 4th 2015)  Tabitha Ramirez is a 68 years old right-handed African American female, accompanied by her daughter Tabitha Ramirez for evaluation of peripheral neuropathy. Her primary care physician is Dr. Willis Modena her oncologist is Dr. Jana Hakim  She was diagnosed with clinical T2 NX, stage II invasive ductal carcinoma, grade 3, triple negative, with an MIB-1 of 84% , underwent right lumpectomy and right axillary lymph node dissection on 09/29/2012. The pathology from that procedure ([id number]) showed an area of approximately 1.1 cm showing residual invasive nests and cells of ductal carcinoma, grade 2, but mostly posttreatment effect. Repeat estrogen and progesterone receptors were again negative. All 11 lymph nodes were benign. Margins were ample.  She was started on chemotherapy, neoadjuvant doxorubicin and cyclophosphamide being given in dose dense fashion with Neulasta support starting 03/17/2013, followed by weekly carboplatin and paclitaxel x12, with final cycle 08/25/2013. She has developed chemotherapy-induced anemia, Status post blood transfusion, 2 units of packed red blood cells, on 06/10/2013 and again on 12-14.   This is followed by radiation therapy, from Feb 2015, completed 12/24/2013   She complains of bilateral feet and hands paresthesia around November 2014, close to the end of her chemotherapy, gradul onset, progressively worse,  She began to use walker in March 2015, despite stopping chemotherapy in December 2014, she continued to get worse, per patient "day by day", she now complains of tingling sensation, burning sensation in her feet, sand paper sensation in her hands, could no longer hold objects. Last time she still was in February 2015 no longer step on  gas pedal, or holding steer wheel.  She denies bowel bladder incontinence, She denies neck, low back pain,  She was started on gabapentin 300 mg qid  since April 2015, which did help her neuropathic pain, 60% improvement, tradamol was helpful too  She continued to have worsening anemia too, decreasing from 10.2 in February 2015 to 9.2.    Recent laboratory evaluation showed normal CMP with exception of decreased albumin 3 point 2, anemia, with hemoglobin 9 point 2, hematocrit 27 point 4 RDW 15 point 2, normal folic acid 9,6 Y10 175, ferritin 937.   UPDATE June 15th 2015; Since her initial visit in May  4th 2015,  EMG nerve conduction study May 14th 2015, which has demonstrates severe peripheral neuropathy, with mixed demyelinating, and axonal features, she has absent sensory responses in upper and lower extremity, absent bilateral lower extremity motor response, and ulnar motor response, right median motor responses,showed prolonged distal latency, F waves latency, conduction velocity of 34 m/s, in the setting of severely decreased C. map amplitude. Spontaneous activity, active neuropathic changes involving lower extremity muscles, and distal upper extremity muscles  CSF showed WBC 0, RBC 0, glucose 65, total protein 125 significantly elevated,  Laboratory evaluation showed elevated TSH 15.9, free T4, mildly low 4.3, normal or negative HIV, RPR, ANA, Z02 585, folic acid 9.6, elevated MMA 947, Thiamine, elevated ESR,74, CRP ,   MRI of the brain showed moderate periventricular white matter disease,   MRI cervical, thoracic, lumbar spine showed degenerative disc disease, but there was no significant canal, or foraminal stenosis,  She is taking gabapentin 300 mg 2 tablets 3 times a day, which has helped her neuropathic pain, but cause frequent urination,  UPDATE Sep 17th 2015: She began to have iv[facility] infusion [facility] since July 28th 2015, 2g/kg over 5 days, then monthly for total  of 5 treatment,  She reported 40 percent improvement after IVIG infusion, she felt stronger, she can walk better, she has much less neuropathic pain,  she used to take gabapentin 600 mg 3 times a day, now is able to cut back to 300 mg every night.    She denies significant side effects from IV infusion, continued to have bilateral hands and distal leg weakness.  I have reviewed nerve biopsy result May 13 2014, the nerve fascicles are remarkable for moderately severe loss of myelinated fibers, teased nerve fiber preparation shows a single linear array of myelin ovoids consistent with a degenerating myelinated fibers. There was no evidence of vasculitis, inflammatory neuropathy, no amyloid deposition, no specific findings for demyelinating neuropathy.  UPDATE Dec 1st 2015:  She has total of 6 IVIG treatment, last one was Nov 27th 2015 through home health agents, she reported 50-70% improvement, she can use her hands better,  She continues to have significant hand atrophy, walking has improved some too.  She still mild improvement of feeing in her feet and hands,  no significant pain, she is only taking gabapentin 300 mg 1-2 tablets each day  She could not drive, she lives at home with her daughter's family, her daughter is currently pregnant, it is high risk pregnancy, she wants to hold of further IVIG treatment at this point   REVIEW OF SYSTEMS: Full 14 system review of systems performed and notable only for gait difficulty  ALLERGIES: Allergies  Allergen Reactions  . Penicillins Rash    HOME MEDICATIONS: Current Outpatient Prescriptions on File Prior to Visit  Medication Sig Dispense Refill  . Acetaminophen (TYLENOL EXTRA STRENGTH PO) Take 2 tablets by mouth daily as needed (for pain).       Marland Kitchen gabapentin (NEURONTIN) 300 MG capsule Take 1 capsule (300 mg total) by mouth 4 (four) times daily. Meals and bedtime  120 capsule  2  . hyaluronate sodium (RADIAPLEXRX) GEL Apply 1 application topically 2 (two) times daily.      Marland Kitchen LORazepam (ATIVAN) 0.5 MG tablet Take 1 tablet (0.5 mg total) by mouth at bedtime as needed for anxiety or sleep.  30  tablet  0  . Multiple Vitamin (MULTIVITAMIN WITH MINERALS) TABS Take 1 tablet by mouth daily. LIIQUID - Takes 1 cap full daily      . ondansetron (ZOFRAN) 8 MG tablet Take 8 mg by mouth every 8 (eight) hours as needed for nausea.       . pantoprazole (PROTONIX) 40 MG tablet Take 1 tablet (40 mg total) by mouth daily at 12 noon.  30 tablet  2  . prochlorperazine (COMPAZINE) 10 MG tablet Take 10 mg by mouth every 6 (six) hours as needed for nausea or vomiting.      . traMADol (ULTRAM) 50 MG tablet Take 1 tablet (50 mg total) by mouth every 6 (six) hours as needed for moderate pain.  90 tablet  0  . triamterene-hydrochlorothiazide (MAXZIDE-25) 37.5-25 MG per tablet Take 0.5 tablets by mouth daily.         PAST MEDICAL HISTORY: Past Medical History  Diagnosis Date  . Heart murmur   . Hypertension   . Anemia   . Allergy   . Migraines   . History of blood transfusion     "related to chemo/breast cancer" (09/29/2013)  . GERD (gastroesophageal reflux disease)   . Breast cancer   . Arthritis     "joints" (09/29/2013)  . TMJ (temporomandibular joint syndrome)     "  left; just dx'd" (09/29/2013    PAST SURGICAL HISTORY: Past Surgical History  Procedure Laterality Date  . Portacath placement N/A 03/10/2013    Procedure: INSERTION PORT-A-CATH WITH FLUOROSCOPY AND ULTRASOUND;  Surgeon: Adin Hector, MD;  Location: [place];  Service: General;  Laterality: N/A;  . Axillary lymph node biopsy Right 03/10/2013    Procedure: AXILLARY LYMPH NODE BIOPSY;  Surgeon: Adin Hector, MD;  Location: [facility];  Service: General;  Laterality: Right;  sentinel node with blue dye  . Esophagogastroduodenoscopy N/A 04/28/2013    Procedure: ESOPHAGOGASTRODUODENOSCOPY (EGD);  Surgeon: Wonda Horner, MD;  Location: [facility];  Service: Endoscopy;  Laterality: N/A;  . Colonoscopy N/A 04/30/2013    Procedure: COLONOSCOPY;  Surgeon: Wonda Horner, MD;  Location: [facility];  Service: Endoscopy;  Laterality: N/A;  .  Port-a-cath removal  09/29/2013  . Breast lumpectomy Right 09/29/2013    needle localization w/axillary LND/notes 09/29/2013  . Total abdominal hysterectomy      With bilateral salpingo-oophorectomy  . Tonsillectomy    . Mastectomy Right 09/29/2013    "partial"  . Breast lumpectomy with needle localization and axillary lymph node dissection Right 09/29/2013    Procedure: RIGHT BREAST NEEDLE LOCALIZED LUMPECTOMY AND AXILLARY LYMPH NODE DISSECTION;  Surgeon: Adin Hector, MD;  Location: [facility];  Service: General;  Laterality: Right;  . Port-a-cath removal Left 09/29/2013    Procedure: REMOVAL PORT-A-CATH;  Surgeon: Adin Hector, MD;  Location: [facility];  Service: General;  Laterality: Left;    FAMILY HISTORY: Family History  Problem Relation Age of Onset  . Breast Cancer Mother 22  . Congestive Heart Failure Mother   . Colon cancer Brother   . Diabetes Brother 21  . Heart disease Brother     AMI 05/22/2012  . Congestive Heart Failure Brother   . Breast Cancer Maternal Grandmother 17    SOCIAL HISTORY:  History   Social History  . Marital Status: Divorced    Spouse Name: n/a    Number of Children: 1  . Years of Education: 17   Occupational History  . retired     Sports coach  . subsitute     Social History Main Topics  . Smoking status: Former Smoker -- 1 years    Types: Cigarettes  . Smokeless tobacco: Never Used     Comment: smoked in college 1 yr   . Alcohol Use: No     Comment: Wine occasionally  . Drug Use: No  . Sexual Activity: Not Currently    Partners: Male    Birth Control/ Protection: Surgical   Other Topics Concern  . Not on file   Social History Narrative   Retired, worked for [employer] system in [employer] in 7/08. Currently work as Oceanographer     PHYSICAL EXAM   Filed Vitals:   08/25/14 1310  BP: 139/81  Pulse: 88  Height: 5' 4"  (1.626 m)  Weight: 147 lb 12 oz (67.019 kg)   Body mass index is 25.35  kg/(m^2).   Generalized: In no acute distress  Neck: Supple, no carotid bruits   Cardiac: Regular rate rhythm  Pulmonary: Clear to auscultation bilaterally  Musculoskeletal: No deformity  Neurological examination  Mentation: Alert oriented to time, place, history taking, and causual conversation, tired looking elderly female,  Cranial nerve II-XII: Pupils were equal round reactive to light. Extraocular movements were full.  Visual field were full on confrontational test. Bilateral fundi were sharp.  Facial sensation and strength were normal. Hearing was intact to finger rubbing bilaterally. Uvula tongue midline.  Head turning and shoulder shrug and were normal and symmetric.Tongue protrusion into cheek strength was normal.  Motor: Moderate bilateral hands intrinsic muscle atrophy, bilateral shoulder abduction 5, external rotation 5, elbow flexion4+, elbow extension 4, wrist flexion4-, wrist extension4-, grip 3, hip flexion 5  knee flexion 5 knee extension 5  Ankle dorsi flexion1 plantar flexion1.  Sensory: length dependent decreased  fine touch, pinprick to  wrist,  and  midshin level, absent vibratory sensation to midshin, decreased vibratory sensation at fingertips,  and absent proprioception at toes, fingers.  Coordination: Normal finger to nose, heel-to-shin bilaterally there was no truncal ataxia  Gait: need push up to get up from seated position,  wide based, unsteady, bilateral flailed gait.   Romberg signs: Negative  Deep tendon reflexes: areflexia. plantar responses were flexor bilaterally.   DIAGNOSTIC DATA (LABS, IMAGING, TESTING) - I reviewed patient records, labs, notes, testing and imaging myself where available.  Lab Results  Component Value Date   WBC 6.1 06/22/2014   HGB 10.7* 06/22/2014   HCT 32.5* 06/22/2014   MCV 102.2* 06/22/2014   PLT 284 06/22/2014      Component Value Date/Time   NA 143 06/22/2014 1107   NA 140 05/12/2014 1238   K 3.3* 06/22/2014  1107   K 3.4* 05/12/2014 1238   CL 100 05/12/2014 1238   CL 100 03/13/2013 0918   CO2 27 06/22/2014 1107   CO2 27 05/12/2014 1238   GLUCOSE 119 06/22/2014 1107   GLUCOSE 107* 05/12/2014 1238   GLUCOSE 169* 03/13/2013 0918   BUN 17.2 06/22/2014 1107   BUN 18 05/12/2014 1238   CREATININE 1.0 06/22/2014 1107   CREATININE 0.82 05/12/2014 1238   CREATININE 0.94 11/29/2011 1535   CALCIUM 9.7 06/22/2014 1107   CALCIUM 9.7 05/12/2014 1238   PROT 9.0* 06/22/2014 1107   PROT 7.4 01/26/2014 1057   PROT 7.2 09/16/2013 1000   ALBUMIN 3.3* 06/22/2014 1107   ALBUMIN 3.6 09/16/2013 1000   AST 26 06/22/2014 1107   AST 20 09/16/2013 1000   ALT 21 06/22/2014 1107   ALT 21 09/16/2013 1000   ALKPHOS 92 06/22/2014 1107   ALKPHOS 89 09/16/2013 1000   BILITOT 0.45 06/22/2014 1107   BILITOT 0.3 09/16/2013 1000   GFRNONAA 73* 05/12/2014 1238   GFRNONAA 64 11/29/2011 1535   GFRAA 85* 05/12/2014 1238   GFRAA 74 11/29/2011 1535   Lab Results  Component Value Date   CHOL 164 11/29/2011   HDL 43 11/29/2011   LDLCALC 87 11/29/2011   TRIG 170* 11/29/2011   CHOLHDL 3.8 11/29/2011   Lab Results  Component Value Date   HGBA1C 7.1* 04/27/2013   Lab Results  Component Value Date   VITAMINB12 623 01/20/2014   Lab Results  Component Value Date   TSH 15.960* 01/26/2014    ASSESSMENT AND PLAN  Tabitha Ramirez is a 68 y.o. female  with history of breast cancer, status post lobectomy in May 2014, followed by chemotherapy, radiation therapy, now with progressive worsening gait difficulty, distal arm and leg  muscle atrophy, profound weakness, length dependent sensory changes, areflexia, Electrodiagnostic study showed mixed axonal, and myelinating peripheral neuropathy  Spinal fluid testing showed elevated CSF protein 125, nerve biopsy in August 2015 has demonstrate moderately severe loss of myelinated fibers, no evidence of inflammation, no evidence of vasculitis, no amyloid deposition, no specific  features suggestive of demyelinating.  She reported moderate improvement after IVIG treatment, she has much less neuropathic pain, mild improved gait difficulty, I would suggest her continue IVIG treatment, however,,she wants to hold off further evaluation at this point because of her family situations, Home Physical therapy Return to clinic in April 2016  Marcial Pacas, M.D. Ph.D.  [facility] [address] [place], [place] [address] [phone]

## 2014-08-27 ENCOUNTER — Telehealth: Payer: Self-pay

## 2014-08-27 NOTE — Telephone Encounter (Signed)
Returned pt call re: boswellia serrate extract.  LMOVM - Let pt know extract is ok to take.

## 2014-08-27 NOTE — Telephone Encounter (Signed)
Mailed patient scat forms. Patient will fill out and return to [facility].

## 2014-08-31 ENCOUNTER — Telehealth: Payer: Self-pay | Admitting: Neurology

## 2014-08-31 DIAGNOSIS — G6289 Other specified polyneuropathies: Secondary | ICD-10-CM

## 2014-08-31 DIAGNOSIS — M792 Neuralgia and neuritis, unspecified: Secondary | ICD-10-CM

## 2014-08-31 DIAGNOSIS — G629 Polyneuropathy, unspecified: Secondary | ICD-10-CM

## 2014-08-31 NOTE — Telephone Encounter (Signed)
Dr Krista Blue patient states that after last infusion she experience headache and right sided pain, please advise.

## 2014-08-31 NOTE — Telephone Encounter (Signed)
Patient stated she would like to proceed with infusion, but wanted to make Dr. Krista Blue aware experienced headache and right sided pain after previous infusion.  Please call and advise.

## 2014-09-01 ENCOUNTER — Other Ambulatory Visit: Payer: Self-pay | Admitting: *Deleted

## 2014-09-01 DIAGNOSIS — C50119 Malignant neoplasm of central portion of unspecified female breast: Secondary | ICD-10-CM

## 2014-09-01 MED ORDER — OXYCODONE-ACETAMINOPHEN 5-325 MG PO TABS: 1.0000 | ORAL_TABLET | ORAL | Status: DC | PRN

## 2014-09-01 NOTE — Telephone Encounter (Signed)
Left message to her at both phone numbers listed.

## 2014-09-05 NOTE — Telephone Encounter (Signed)
No entry 

## 2014-09-22 NOTE — Telephone Encounter (Addendum)
I have talked with the patient, she needs to help her family, she did reported moderate improvement with previous IVIG treatment, last treatment was August 21 2014,  I will continue IVIG treatment, 500 mg/kg for 2 days, home health, order was placed  Hinton Dyer, please fax order to [facility].

## 2014-09-22 NOTE — Telephone Encounter (Signed)
-----   Message from Gilda Crease sent at 09/04/2014  2:11 PM EST ----- Regarding: IVIG Good Afternoon Dr Krista Blue Can you tell me if this patient will be continuing with IVIG?  If so, can I get updated orders please? Thank you Gwinda Passe

## 2014-10-01 ENCOUNTER — Other Ambulatory Visit: Payer: Self-pay

## 2014-10-01 DIAGNOSIS — C50119 Malignant neoplasm of central portion of unspecified female breast: Secondary | ICD-10-CM

## 2014-10-01 MED ORDER — OXYCODONE-ACETAMINOPHEN 5-325 MG PO TABS: 1.0000 | ORAL_TABLET | ORAL | Status: DC | PRN

## 2014-10-01 NOTE — Telephone Encounter (Signed)
Let pt know prescription would be in book - confirmed needed to be picked up by 4 pm.  Pt voiced understanding.

## 2014-11-02 ENCOUNTER — Other Ambulatory Visit: Payer: Self-pay | Admitting: *Deleted

## 2014-11-02 ENCOUNTER — Telehealth: Payer: Self-pay | Admitting: *Deleted

## 2014-11-02 DIAGNOSIS — C50119 Malignant neoplasm of central portion of unspecified female breast: Secondary | ICD-10-CM

## 2014-11-02 MED ORDER — OXYCODONE-ACETAMINOPHEN 5-325 MG PO TABS: 1.0000 | ORAL_TABLET | ORAL | Status: DC | PRN

## 2014-11-02 NOTE — Telephone Encounter (Signed)
CALL PT.'S CELL [phone] WHEN PRESCRIPTION IS READY.

## 2014-11-26 ENCOUNTER — Telehealth: Payer: Self-pay | Admitting: *Deleted

## 2014-11-26 DIAGNOSIS — G6289 Other specified polyneuropathies: Secondary | ICD-10-CM

## 2014-11-26 DIAGNOSIS — R269 Unspecified abnormalities of gait and mobility: Secondary | ICD-10-CM

## 2014-11-26 NOTE — Telephone Encounter (Signed)
Michelle: Please let patient's know I have put in order for home physical therapy  She needs a follow-up with me, last visit was December 2015

## 2014-11-26 NOTE — Telephone Encounter (Signed)
Is it ok for home health?  (Order has to be under ambulatory care - face-to-face to [facility]).

## 2014-11-26 NOTE — Telephone Encounter (Signed)
Patient is calling wanting to how can she get home health therapy for her hands and feet due to her neuropathy. Please call the patient

## 2014-11-27 NOTE — Telephone Encounter (Signed)
-----   Message from Danford Bad sent at 11/27/2014  8:37 AM EST ----- In order to have this patient do home health medicare requires you to have a face to face within 30days of the request.

## 2014-11-27 NOTE — Telephone Encounter (Signed)
Appt scheduled

## 2014-11-27 NOTE — Telephone Encounter (Signed)
Tabitha Ramirez, give her a follow up visit.

## 2014-11-30 ENCOUNTER — Other Ambulatory Visit: Payer: Self-pay | Admitting: *Deleted

## 2014-11-30 ENCOUNTER — Telehealth: Payer: Self-pay | Admitting: *Deleted

## 2014-11-30 DIAGNOSIS — C50119 Malignant neoplasm of central portion of unspecified female breast: Secondary | ICD-10-CM

## 2014-11-30 MED ORDER — OXYCODONE-ACETAMINOPHEN 5-325 MG PO TABS: 1.0000 | ORAL_TABLET | ORAL | Status: DC | PRN

## 2014-11-30 NOTE — Telephone Encounter (Signed)
Pt called and requested refill of Oxycodone. Pt's phone    [phone].

## 2014-12-08 ENCOUNTER — Ambulatory Visit (INDEPENDENT_AMBULATORY_CARE_PROVIDER_SITE_OTHER): Payer: Medicare Other | Admitting: Neurology

## 2014-12-08 ENCOUNTER — Encounter: Payer: Self-pay | Admitting: Neurology

## 2014-12-08 VITALS — BP 158/86 | HR 91 | Ht 64.0 in | Wt 145.0 lb

## 2014-12-08 DIAGNOSIS — R269 Unspecified abnormalities of gait and mobility: Secondary | ICD-10-CM

## 2014-12-08 DIAGNOSIS — G629 Polyneuropathy, unspecified: Secondary | ICD-10-CM | POA: Diagnosis not present

## 2014-12-08 DIAGNOSIS — G6289 Other specified polyneuropathies: Secondary | ICD-10-CM

## 2014-12-08 NOTE — Progress Notes (Signed)
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 Her primary care physician is Dr. Willis Modena her oncologist is Dr. Jana Hakim  She was diagnosed with clinical T2 NX, stage II invasive ductal carcinoma, grade 3, triple negative, with an MIB-1 of 84% , underwent right lumpectomy and right axillary lymph node dissection on 09/29/2012. The pathology from that procedure ([id number]) showed an area of approximately 1.1 cm showing residual invasive nests and cells of ductal carcinoma, grade 2, but mostly posttreatment effect. Repeat estrogen and progesterone receptors were again negative. All 11 lymph nodes were benign. Margins were ample.  She was started on chemotherapy, neoadjuvant doxorubicin and cyclophosphamide being given in dose dense fashion with Neulasta support starting 03/17/2013, followed by weekly carboplatin and paclitaxel x12, with final cycle 08/25/2013. She has developed chemotherapy-induced anemia, Status post blood transfusion, 2 units of packed red blood cells, on 06/10/2013 and again on 12-14.   This is followed by radiation therapy, from Feb 2015, completed 12/24/2013   She complains of bilateral feet and hands paresthesia around November 2014, close to the end of her chemotherapy, gradul onset, progressively worse,  She began to use walker in March 2015, despite stopping chemotherapy in December 2014, she continued to get worse, per patient "day by day", she now complains of tingling sensation, burning sensation in her feet, sand paper sensation in her hands, could no longer hold objects. Last time she still was in February 2015 no longer step on  gas pedal, or holding steer wheel.  She denies bowel bladder incontinence, She denies neck, low back pain,  She was started on gabapentin 300 mg qid  since April 2015, which did help her neuropathic pain, 60% improvement, tradamol was helpful too  She continued to have worsening anemia too, decreasing from 10.2 in February 2015 to 9.2.    Recent laboratory evaluation showed normal CMP with exception of decreased albumin 3 point 2, anemia, with hemoglobin 9 point 2, hematocrit 27 point 4 RDW 15 point 2, normal folic acid 9,6 G66 599, ferritin 937.   UPDATE June 15th 2015; Since her initial visit in May  4th 2015,  EMG nerve conduction study May 14th 2015, which has demonstrates severe peripheral neuropathy, with mixed demyelinating, and axonal features, she has absent sensory responses in upper and lower extremity, absent bilateral lower extremity motor response, and ulnar motor response, right median motor responses,showed prolonged distal latency, F waves latency, conduction velocity of 34 m/s, in the setting of severely decreased C. map amplitude. Spontaneous activity, active neuropathic changes involving lower extremity muscles, and distal upper extremity muscles  CSF showed WBC 0, RBC 0, glucose 65, total protein 125 significantly elevated,  Laboratory evaluation showed elevated TSH 15.9, free T4, mildly low 4.3, normal or negative HIV, RPR, ANA, J57 017, folic acid 9.6, elevated MMA 947, Thiamine, elevated ESR,74, CRP ,   MRI of the brain showed moderate periventricular white matter disease,   MRI cervical, thoracic, lumbar spine showed degenerative disc disease, but there was no significant canal, or foraminal stenosis,  She is taking gabapentin 300 mg 2 tablets 3 times a day, which has helped her neuropathic pain, but cause frequent urination,  UPDATE Sep 17th 2015: She began to [facility] July [facility]th 2015, 2g/kg over 5 days, then monthly for total  of 5 treatment,  She reported 40 percent improvement after IVIG infusion, she felt stronger, she can walk better, she has much less neuropathic pain,  she used to take gabapentin 600 mg 3 times a day, now is able to cut back to 300 mg every night.    She denies significant side effects from IV infusion, continued to have bilateral hands and distal leg weakness.  I have reviewed nerve biopsy result May 13 2014, the nerve fascicles are remarkable for moderately severe loss of myelinated fibers, teased nerve fiber preparation shows a single linear array of myelin ovoids consistent with a degenerating myelinated fibers. There was no evidence of vasculitis, inflammatory neuropathy, no amyloid deposition, no specific findings for demyelinating neuropathy.  UPDATE Dec 1st 2015:  She has total of 6 IVIG treatment, last one was Nov 27th 2015 through [facility], she reported 50-70% improvement, she can use her hands better,  She continues to have significant hand atrophy, walking has improved some too.  She still mild improvement of feeing in her feet and hands,  no significant pain, she is only taking gabapentin 300 mg 1-2 tablets each day  She could not drive, she lives at home with her daughter's family, her daughter is currently pregnant, it is high risk pregnancy, she wants to hold of further IVIG treatment at this point  UPDATE March 15th 2016: She does not have IVIG since Nov 27th 2015, wants to have home  PT. She did show improved with IVIG, but very expensive, she has less hand tightness, she can walk on the cane.     REVIEW OF SYSTEMS: Full 14 system review of systems performed and notable only for gait difficulty  ALLERGIES: Allergies  Allergen Reactions  . Penicillins Rash    HOME MEDICATIONS: Current Outpatient Prescriptions on File Prior to Visit  Medication Sig Dispense Refill  . Acetaminophen (TYLENOL EXTRA STRENGTH PO) Take 2 tablets by mouth daily as needed (for pain).       Marland Kitchen gabapentin (NEURONTIN) 300 MG capsule Take 1 capsule (300 mg total) by mouth 4 (four) times daily. Meals and bedtime  120 capsule  2  .  hyaluronate sodium (RADIAPLEXRX) GEL Apply 1 application topically 2 (two) times daily.      Marland Kitchen LORazepam (ATIVAN) 0.5 MG tablet Take 1 tablet (0.5 mg total) by mouth at bedtime as needed for anxiety or sleep.  30 tablet  0  . Multiple Vitamin (MULTIVITAMIN WITH MINERALS) TABS Take 1 tablet by mouth daily. LIIQUID - Takes 1 cap full daily      . ondansetron (ZOFRAN) 8 MG tablet Take 8 mg by mouth every 8 (eight) hours as needed for nausea.       . pantoprazole (PROTONIX) 40 MG tablet Take 1 tablet (40 mg total) by mouth daily at 12 noon.  30 tablet  2  . prochlorperazine (COMPAZINE) 10 MG tablet Take 10 mg by mouth every 6 (six) hours as needed for nausea or vomiting.      . traMADol (ULTRAM) 50 MG tablet Take 1 tablet (50 mg total) by mouth every 6 (six) hours as needed for moderate pain.  90 tablet  0  . triamterene-hydrochlorothiazide (MAXZIDE-25) 37.5-25 MG per tablet Take 0.5 tablets by mouth daily.         PAST MEDICAL HISTORY: Past Medical History  Diagnosis Date  . Heart murmur   . Hypertension   . Anemia   . Allergy   . Migraines   . History  of blood transfusion     "related to chemo/breast cancer" (09/29/2013)  . GERD (gastroesophageal reflux disease)   . Breast cancer   . Arthritis     "joints" (09/29/2013)  . TMJ (temporomandibular joint syndrome)     "left; just dx'd" (09/29/2013    PAST SURGICAL HISTORY: Past Surgical History  Procedure Laterality Date  . Portacath placement N/A 03/10/2013    Procedure: INSERTION PORT-A-CATH WITH FLUOROSCOPY AND ULTRASOUND;  Surgeon: Adin Hector, MD;  Location: [place];  Service: General;  Laterality: N/A;  . Axillary lymph node biopsy Right 03/10/2013    Procedure: AXILLARY LYMPH NODE BIOPSY;  Surgeon: Adin Hector, MD;  Location: [place];  Service: General;  Laterality: Right;  sentinel node with blue dye  . Esophagogastroduodenoscopy N/A 04/28/2013    Procedure: ESOPHAGOGASTRODUODENOSCOPY (EGD);  Surgeon: Wonda Horner, MD;  Location:  [facility];  Service: Endoscopy;  Laterality: N/A;  . Colonoscopy N/A 04/30/2013    Procedure: COLONOSCOPY;  Surgeon: Wonda Horner, MD;  Location: [facility];  Service: Endoscopy;  Laterality: N/A;  . Port-a-cath removal  09/29/2013  . Breast lumpectomy Right 09/29/2013    needle localization w/axillary LND/notes 09/29/2013  . Total abdominal hysterectomy      With bilateral salpingo-oophorectomy  . Tonsillectomy    . Mastectomy Right 09/29/2013    "partial"  . Breast lumpectomy with needle localization and axillary lymph node dissection Right 09/29/2013    Procedure: RIGHT BREAST NEEDLE LOCALIZED LUMPECTOMY AND AXILLARY LYMPH NODE DISSECTION;  Surgeon: Adin Hector, MD;  Location: [place];  Service: General;  Laterality: Right;  . Port-a-cath removal Left 09/29/2013    Procedure: REMOVAL PORT-A-CATH;  Surgeon: Adin Hector, MD;  Location: [place];  Service: General;  Laterality: Left;    FAMILY HISTORY: Family History  Problem Relation Age of Onset  . Breast Cancer Mother 71  . Congestive Heart Failure Mother   . Colon cancer Brother   . Diabetes Brother 61  . Heart disease Brother     AMI 05/22/2012  . Congestive Heart Failure Brother   . Breast Cancer Maternal Grandmother 84    SOCIAL HISTORY:  History   Social History  . Marital Status: Divorced    Spouse Name: n/a    Number of Children: 1  . Years of Education: 17   Occupational History  . retired     Sports coach  . subsitute     Social History Main Topics  . Smoking status: Former Smoker -- 1 years    Types: Cigarettes  . Smokeless tobacco: Never Used     Comment: smoked in college 1 yr   . Alcohol Use: No     Comment: Wine occasionally  . Drug Use: No  . Sexual Activity: Not Currently    Partners: Male    Birth Control/ Protection: Surgical   Other Topics Concern  . Not on file   Social History Narrative   Retired, worked for [employer] system in [employer] in 7/08. Currently work as  Oceanographer     PHYSICAL EXAM   Filed Vitals:   12/08/14 1210  BP: 158/86  Pulse: 91  Height: 5' 4"  (1.626 m)  Weight: 145 lb (65.772 kg)   Body mass index is 24.88 kg/(m^2).  PHYSICAL EXAMNIATION:  Gen: NAD, conversant, well nourised, obese, well groomed                     Cardiovascular: Regular  rate rhythm, no peripheral edema, warm, nontender. Eyes: Conjunctivae clear without exudates or hemorrhage Neck: Supple, no carotid bruise. Pulmonary: Clear to auscultation bilaterally   NEUROLOGICAL EXAM:  MENTAL STATUS: Speech:    Speech is normal; fluent and spontaneous with normal comprehension.  Cognition:    The patient is oriented to person, place, and time;     recent and remote memory intact;     language fluent;     normal attention, concentration,     fund of knowledge.  CRANIAL NERVES: CN II: Visual fields are full to confrontation. Fundoscopic exam is normal with sharp discs and no vascular changes. Venous pulsations are present bilaterally. Pupils are 4 mm and briskly reactive to light. Visual acuity is 20/20 bilaterally. CN III, IV, VI: extraocular movement are normal. No ptosis. CN V: Facial sensation is intact to pinprick in all 3 divisions bilaterally. Corneal responses are intact.  CN VII: Face is symmetric with normal eye closure and smile. CN VIII: Hearing is normal to rubbing fingers CN IX, X: Palate elevates symmetrically. Phonation is normal. CN XI: Head turning and shoulder shrug are intact CN XII: Tongue is midline with normal movements and no atrophy.  MOTOR: There is no pronator drift of out-stretched arms. Muscle bulk and tone are normal. Muscle strength is normal.   Shoulder abduction Shoulder external rotation Elbow flexion Elbow extension Wrist flexion Wrist extension Finger abduction Hip flexion Knee flexion Knee extension Ankle dorsi flexion Ankle plantar flexion  R 5 5 5 5 5 5 4 5 5 5 4 4   L 5 5 5 5 5 5 4 5 5 5 4 4      REFLEXES: areflexia. Plantar responses are flexor.  SENSORY: Length dependent decreased light touch, pinprick to knee level, absent position sense, and vibration senses in fingers and toes.  COORDINATION: Rapid alternating movements and fine finger movements are intact. There is no dysmetria on finger-to-nose and heel-knee-shin. There are no abnormal or extraneous movements.   GAIT/STANCE: She needs push up to get up from seated position, wide based, cautious, unsteady gait     DIAGNOSTIC DATA (LABS, IMAGING, TESTING) - I reviewed patient records, labs, notes, testing and imaging myself where available.  Lab Results  Component Value Date   WBC 6.1 06/22/2014   HGB 10.7* 06/22/2014   HCT 32.5* 06/22/2014   MCV 102.2* 06/22/2014   PLT 284 06/22/2014      Component Value Date/Time   NA 143 06/22/2014 1107   NA 140 05/12/2014 1238   K 3.3* 06/22/2014 1107   K 3.4* 05/12/2014 1238   CL 100 05/12/2014 1238   CL 100 03/13/2013 0918   CO2 27 06/22/2014 1107   CO2 27 05/12/2014 1238   GLUCOSE 119 06/22/2014 1107   GLUCOSE 107* 05/12/2014 1238   GLUCOSE 169* 03/13/2013 0918   BUN 17.2 06/22/2014 1107   BUN 18 05/12/2014 1238   CREATININE 1.0 06/22/2014 1107   CREATININE 0.82 05/12/2014 1238   CREATININE 0.94 11/29/2011 1535   CALCIUM 9.7 06/22/2014 1107   CALCIUM 9.7 05/12/2014 1238   PROT 9.0* 06/22/2014 1107   PROT 7.4 01/26/2014 1057   PROT 7.2 09/16/2013 1000   ALBUMIN 3.3* 06/22/2014 1107   ALBUMIN 3.6 09/16/2013 1000   AST 26 06/22/2014 1107   AST 20 09/16/2013 1000   ALT 21 06/22/2014 1107   ALT 21 09/16/2013 1000   ALKPHOS 92 06/22/2014 1107   ALKPHOS 89 09/16/2013 1000   BILITOT 0.45 06/22/2014 1107  BILITOT 0.3 09/16/2013 1000   GFRNONAA 73* 05/12/2014 1238   GFRNONAA 64 11/29/2011 1535   GFRAA 85* 05/12/2014 1238   GFRAA 74 11/29/2011 1535   Lab Results  Component Value Date   CHOL 164 11/29/2011   HDL 43 11/29/2011   LDLCALC 87 11/29/2011    TRIG 170* 11/29/2011   CHOLHDL 3.8 11/29/2011   Lab Results  Component Value Date   HGBA1C 7.1* 04/27/2013   Lab Results  Component Value Date   HOZYYQMG50 037 01/20/2014   Lab Results  Component Value Date   TSH 15.960* 01/26/2014    ASSESSMENT AND PLAN  LAURELAI LEPP is a 69 y.o. female  with history of breast cancer, status post lobectomy in May 2014, followed by chemotherapy, radiation therapy, now with progressive worsening gait difficulty, distal arm and leg  muscle atrophy, profound weakness, length dependent sensory changes, areflexia, Electrodiagnostic study showed mixed axonal, and myelinating peripheral neuropathy  Spinal fluid testing showed elevated CSF protein 125, nerve biopsy in August 2015 has demonstrate moderately severe loss of myelinated fibers, no evidence of inflammation, no evidence of vasculitis, no amyloid deposition, no specific features suggestive of demyelinating.   She reported moderate improvement after IVIG treatment, she has much less neuropathic pain, mild improved gait difficulty, I would suggest her continue IVIG treatment, however,,she wants to hold off further evaluation at this point because of her family situations, Home Physical therapy Return to clinic in 6 months   Marcial Pacas, M.D. Ph.D.  [facility] [address] [place], [place] [address] [phone]

## 2014-12-10 ENCOUNTER — Other Ambulatory Visit: Payer: Self-pay | Admitting: General Surgery

## 2014-12-10 ENCOUNTER — Other Ambulatory Visit (INDEPENDENT_AMBULATORY_CARE_PROVIDER_SITE_OTHER): Payer: Self-pay | Admitting: General Surgery

## 2014-12-10 DIAGNOSIS — R2231 Localized swelling, mass and lump, right upper limb: Secondary | ICD-10-CM

## 2014-12-11 ENCOUNTER — Ambulatory Visit
Admission: RE | Admit: 2014-12-11 | Discharge: 2014-12-11 | Disposition: A | Discharge status: Home / Self Care | Payer: Medicare Other | Source: Ambulatory Visit | Attending: General Surgery | Admitting: General Surgery

## 2014-12-11 DIAGNOSIS — R2231 Localized swelling, mass and lump, right upper limb: Secondary | ICD-10-CM

## 2014-12-18 ENCOUNTER — Telehealth: Payer: Self-pay | Admitting: *Deleted

## 2014-12-18 NOTE — Telephone Encounter (Signed)
[facility] is calling wanting to know if they can get an order for the patient to get occupational  therapy for her hands. Please call  Ricka Burdock [phone]

## 2014-12-18 NOTE — Telephone Encounter (Signed)
Faith, please call in for OT,

## 2014-12-18 NOTE — Telephone Encounter (Signed)
LMOM for [facility] with [facility] to call/fim

## 2014-12-21 NOTE — Telephone Encounter (Signed)
Spoke with Juliann Pulse and per Dr. Krista Blue, advised ok for OT for hands./fim

## 2014-12-21 NOTE — Telephone Encounter (Signed)
2nd message left on ans. machine for Tabitha Ramirez to call back regarding OT/fim

## 2014-12-22 ENCOUNTER — Telehealth: Payer: Self-pay | Admitting: Neurology

## 2014-12-22 NOTE — Telephone Encounter (Signed)
Spoke to Tabitha Ramirez - she has decided to hold off on outpatient or home therapy for now.  She has done some research and wants to try some exercises on her own.  I did speak with her about the benefits of having a licensed therapist teach her specific exercises for her needs but she still declined.  I invited her to contact our office if she should change her mind.

## 2014-12-22 NOTE — Telephone Encounter (Signed)
Tabitha Ramirez with Tabitha Ramirez @ [phone], stated she saw patient for OT and patient would prefer to attend outpatient OT.  Questioning if Dr. Krista Blue could write order for out patient OT and mail to patient.  Please call and advise.

## 2014-12-31 ENCOUNTER — Other Ambulatory Visit: Payer: Self-pay | Admitting: Hematology and Oncology

## 2014-12-31 DIAGNOSIS — C50119 Malignant neoplasm of central portion of unspecified female breast: Secondary | ICD-10-CM

## 2015-01-01 ENCOUNTER — Other Ambulatory Visit: Payer: Self-pay

## 2015-01-01 ENCOUNTER — Other Ambulatory Visit: Payer: Self-pay | Admitting: *Deleted

## 2015-01-01 DIAGNOSIS — C50119 Malignant neoplasm of central portion of unspecified female breast: Secondary | ICD-10-CM

## 2015-01-01 MED ORDER — OXYCODONE-ACETAMINOPHEN 5-325 MG PO TABS: 1.0000 | ORAL_TABLET | ORAL | Status: DC | PRN

## 2015-01-01 NOTE — Telephone Encounter (Signed)
NOTIFIED PT. THAT HER PRESCRIPTION IS READY.

## 2015-01-01 NOTE — Telephone Encounter (Signed)
VOICE MAIL FROM 10:59AM. PT. SOUNDS VERY GROGGY. THIS NOTE WAS ROUTED TO [place].

## 2015-01-01 NOTE — Telephone Encounter (Signed)
Refill placed in book for pt pickup.  Triage to notify pt.

## 2015-01-20 ENCOUNTER — Telehealth: Payer: Self-pay

## 2015-01-20 DIAGNOSIS — C50119 Malignant neoplasm of central portion of unspecified female breast: Secondary | ICD-10-CM

## 2015-01-20 MED ORDER — OXYCODONE-ACETAMINOPHEN 5-325 MG PO TABS: 1.0000 | ORAL_TABLET | ORAL | Status: DC | PRN

## 2015-01-20 NOTE — Telephone Encounter (Signed)
Daughter calling for percocet refill. She uses 1 tab q 4 hours. It does control her pain at that dosage. rx prepared for signature.

## 2015-02-15 ENCOUNTER — Other Ambulatory Visit: Payer: Self-pay

## 2015-02-15 ENCOUNTER — Telehealth: Payer: Self-pay | Admitting: *Deleted

## 2015-02-15 DIAGNOSIS — C50119 Malignant neoplasm of central portion of unspecified female breast: Secondary | ICD-10-CM

## 2015-02-15 MED ORDER — OXYCODONE-ACETAMINOPHEN 5-325 MG PO TABS: 1.0000 | ORAL_TABLET | ORAL | Status: DC | PRN

## 2015-02-15 NOTE — Telephone Encounter (Signed)
TC from daughter, michelle requesting refill on percocet. Uses 1 every 4 hours with good pain control. Last filled 01/20/15. Please call daughter when prescription is ready for pick up.

## 2015-02-15 NOTE — Telephone Encounter (Signed)
Call made to pt's daughter and let her know prescription is ready to be picked up. She verbalized understanding.

## 2015-02-15 NOTE — Telephone Encounter (Signed)
Last OV 06/22/14.  Next OV 04/02/2015.  Chart reviewed.  Let pt know prescription is ready, can be picked up  Before 4 pm.

## 2015-02-24 ENCOUNTER — Other Ambulatory Visit: Payer: Self-pay | Admitting: Hematology and Oncology

## 2015-02-24 DIAGNOSIS — C50119 Malignant neoplasm of central portion of unspecified female breast: Secondary | ICD-10-CM

## 2015-03-03 ENCOUNTER — Ambulatory Visit (INDEPENDENT_AMBULATORY_CARE_PROVIDER_SITE_OTHER): Payer: Medicare Other | Admitting: Emergency Medicine

## 2015-03-03 VITALS — BP 130/80 | HR 96 | Temp 98.7°F | Resp 16 | Ht 63.0 in | Wt 145.4 lb

## 2015-03-03 DIAGNOSIS — H6123 Impacted cerumen, bilateral: Secondary | ICD-10-CM | POA: Diagnosis not present

## 2015-03-03 NOTE — Progress Notes (Signed)
[facility] [address] [place]  [address] [phone]  Date:  03/03/2015   Name:  Tabitha Ramirez   DOB:  March 09, 1946   MRN:  [id number]  PCP:  No primary care provider on file.    History of Present Illness:  Tabitha Ramirez is a 69 y.o. female patient who presents to [facility] for for chief complaint of bilateral ear clogging. Patient states that this sensation has been going on for months. She believes that this is due to earwax. She has had this happen before. She also has a humming sensation. She denies dizziness, fever, nausea, discharge, or ear pain. She has attempted to clean out her ear with Q-tips, with no success.   Patient Active Problem List   Diagnosis Date Noted  . Neuropathy 06/11/2014  . 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 Gait abnormality 01/26/2014  . Unspecified hereditary and idiopathic peripheral neuropathy 01/26/2014  . Neuropathy due to chemotherapeutic drug 01/20/2014  . Neuropathic pain 01/20/2014  . Anxiety 01/20/2014  . Unspecified deficiency anemia 01/20/2014  . Hx of neutropenia 08/04/2013  . Jaw pain 08/04/2013  . Leukocytosis 04/27/2013  . Pericardial effusion 04/27/2013  . Cancer of central portion of female breast 02/04/2013  . Need for influenza vaccination 11/29/2011  . Hypertriglyceridemia 03/07/2011  . Post-menopausal 03/07/2011  . Anemia 12/17/2010  . Heart murmur 12/17/2010  . Seasonal allergies 12/17/2010  . HYPOKALEMIA 02/19/2009  . Essential hypertension, benign 04/09/2008    Past Medical History  Diagnosis Date  . Heart murmur   . Hypertension   . Anemia   . Allergy   . Migraines   . History of blood transfusion     "related to chemo/breast cancer" (09/29/2013)  . GERD (gastroesophageal reflux disease)   . Breast cancer   . Arthritis     "joints" (09/29/2013)  . TMJ (temporomandibular joint syndrome)     "left; just dx'd" (09/29/2013  . Radiation 11/05/13-12/24/13    Right breast      Past Surgical History  Procedure Laterality Date  . Portacath placement N/A 03/10/2013    Procedure: INSERTION PORT-A-CATH WITH FLUOROSCOPY AND ULTRASOUND;  Surgeon: Adin Hector, MD;  Location: [place];  Service: General;  Laterality: N/A;  . Axillary lymph node biopsy Right 03/10/2013    Procedure: AXILLARY LYMPH NODE BIOPSY;  Surgeon: Adin Hector, MD;  Location: [place];  Service: General;  Laterality: Right;  sentinel node with blue dye  . Esophagogastroduodenoscopy N/A 04/28/2013    Procedure: ESOPHAGOGASTRODUODENOSCOPY (EGD);  Surgeon: Wonda Horner, MD;  Location: [facility];  Service: Endoscopy;  Laterality: N/A;  . Colonoscopy N/A 04/30/2013    Procedure: COLONOSCOPY;  Surgeon: Wonda Horner, MD;  Location: [facility];  Service: Endoscopy;  Laterality: N/A;  . Port-a-cath removal  09/29/2013  . Breast lumpectomy Right 09/29/2013    needle localization w/axillary LND/notes 09/29/2013  . Total abdominal hysterectomy      With bilateral salpingo-oophorectomy  . Tonsillectomy    . Mastectomy Right 09/29/2013    "partial"  . Breast lumpectomy with needle localization and axillary lymph node dissection Right 09/29/2013    Procedure: RIGHT BREAST NEEDLE LOCALIZED LUMPECTOMY AND AXILLARY LYMPH NODE DISSECTION;  Surgeon: Adin Hector, MD;  Location: [facility] [place];  Service: General;  Laterality: Right;  . Port-a-cath removal Left 09/29/2013    Procedure: REMOVAL PORT-A-CATH;  Surgeon: Adin Hector, MD;  Location: [place];  Service: General;  Laterality: Left;  . Sural nerve  bx Right 05/13/2014    Procedure: SURAL NERVE BIOPSY;  Surgeon: Hosie Spangle, MD;  Location: [facility];  Service: Neurosurgery;  Laterality: Right;  Right sural nerve biopsy    History  Substance Use Topics  . Smoking status: Former Smoker -- 1 years    Types: Cigarettes  . Smokeless tobacco: Never Used     Comment: smoked in college 1 yr   . Alcohol Use: No     Comment: Wine occasionally    Family History   Problem Relation Age of Onset  . Cancer Mother 53    Unknown type of cancer  . Congestive Heart Failure Mother   . Colon cancer Brother   . Diabetes Brother 4  . Heart disease Brother     AMI 05/22/2012  . Congestive Heart Failure Brother   . Cancer Maternal Grandmother 63    Unknown type of cancer    Allergies  Allergen Reactions  . Penicillins Rash    Medication list has been reviewed and updated.  Current Outpatient Prescriptions on File Prior to Visit  Medication Sig Dispense Refill  . acetaminophen (TYLENOL) 500 MG tablet Take 1,000 mg by mouth every 6 (six) hours as needed for mild pain.    . B Complex-C (B-COMPLEX WITH VITAMIN C) tablet Take 1 tablet by mouth daily.    Marland Kitchen gabapentin (NEURONTIN) 300 MG capsule Take 1 capsule (300 mg total) by mouth 2 (two) times daily. 60 capsule 11  . levothyroxine (SYNTHROID, LEVOTHROID) 75 MCG tablet Take 75 mcg by mouth daily before breakfast.    . LORazepam (ATIVAN) 0.5 MG tablet TAKE 1 TABLET BY MOUTH AT BEDTIME AS NEEDED FOR SLEEP OR ANXIETY 30 tablet 0  . ondansetron (ZOFRAN) 8 MG tablet TAKE 1 TABLET (8 MG TOTAL) BY MOUTH EVERY 12 (TWELVE) HOURS AS NEEDED FOR NAUSEA. 20 tablet 0  . OVER THE COUNTER MEDICATION Take 15 mLs by mouth daily. MIRACLE 2000 MULTIVITE LIQUID    . oxyCODONE-acetaminophen (PERCOCET/ROXICET) 5-325 MG per tablet Take 1 tablet by mouth every 4 (four) hours as needed for severe pain. 90 tablet 0  . pantoprazole (PROTONIX) 40 MG tablet Take 40 mg by mouth daily.    . traMADol (ULTRAM) 50 MG tablet Take 50 mg by mouth every 6 (six) hours as needed for moderate pain.    Marland Kitchen triamterene-hydrochlorothiazide (MAXZIDE-25) 37.5-25 MG per tablet Take 0.5 tablets by mouth every other day.    . vitamin B-12 (CYANOCOBALAMIN) 1000 MCG tablet Take 1,000 mcg by mouth daily.     No current facility-administered medications on file prior to visit.    ROS ROS unremarkable unless listed above.  Physical Examination: BP 144/88  mmHg  Pulse 96  Temp(Src) 98.7 F (37.1 C) (Oral)  Resp 16  Ht 5\' 3"  (1.6 m)  Wt 145 lb 6.4 oz (65.953 kg)  BMI 25.76 kg/m2  SpO2 98% Ideal Body Weight: Weight in (lb) to have BMI = 25: 140.8  Physical Exam  Constitutional: She appears well-developed and well-nourished. No distress.  HENT:  Head: Normocephalic and atraumatic.  Right Ear: External ear normal.  Left Ear: External ear normal.  Ear canals full with cerumen bilaterally.  There is no ability for visualization of either TM at this time.   --Upon irrigation, left canal and TM are normal without erythema, perforation, or bulging. Right ear canal with non-tender small subdermal bleeding at the anterior region on the ear canal that extends 2-5'oclock area. Right TM normal.  Eyes: Pupils  are equal, round, and reactive to light.     Assessment and Plan: 69 year old with PMH listed above is here today with cerumen impaction.  This was irrigated and cleansed.  Advised appropriate cleaning and management of the cerumen impaction to avoid perforation and possible infection.    1. Cerumen impaction, bilateral   Ivar Drape, PA-C [facility] 03/03/2015 12:58 PM

## 2015-03-03 NOTE — Patient Instructions (Signed)
Cerumen Impaction °A cerumen impaction is when the wax in your ear forms a plug. This plug usually causes reduced hearing. Sometimes it also causes an earache or dizziness. Removing a cerumen impaction can be difficult and painful. The wax sticks to the ear canal. The canal is sensitive and bleeds easily. If you try to remove a heavy wax buildup with a cotton tipped swab, you may push it in further. °Irrigation with water, suction, and small ear curettes may be used to clear out the wax. If the impaction is fixed to the skin in the ear canal, ear drops may be needed for a few days to loosen the wax. People who build up a lot of wax frequently can use ear wax removal products available in your local drugstore. °SEEK MEDICAL CARE IF:  °You develop an earache, increased hearing loss, or marked dizziness. °Document Released: 10/19/2004 Document Revised: 12/04/2011 Document Reviewed: 12/09/2009 °ExitCare® Patient Information ©2015 [employer]. This information is not intended to replace advice given to you by your health care provider. Make sure you discuss any questions you have with your health care provider. ° °

## 2015-03-15 ENCOUNTER — Telehealth: Payer: Self-pay

## 2015-03-15 DIAGNOSIS — C50119 Malignant neoplasm of central portion of unspecified female breast: Secondary | ICD-10-CM

## 2015-03-15 MED ORDER — OXYCODONE-ACETAMINOPHEN 5-325 MG PO TABS: 1.0000 | ORAL_TABLET | ORAL | Status: DC | PRN

## 2015-03-15 NOTE — Telephone Encounter (Signed)
Daughter called for refill of percocet. They live 1 hour away.

## 2015-03-26 ENCOUNTER — Other Ambulatory Visit: Payer: Self-pay | Admitting: Hematology and Oncology

## 2015-03-26 ENCOUNTER — Ambulatory Visit
Admission: RE | Admit: 2015-03-26 | Discharge: 2015-03-26 | Disposition: A | Discharge status: Home / Self Care | Payer: Medicare Other | Source: Ambulatory Visit | Attending: Hematology and Oncology | Admitting: Hematology and Oncology

## 2015-03-26 DIAGNOSIS — C50119 Malignant neoplasm of central portion of unspecified female breast: Secondary | ICD-10-CM

## 2015-03-26 DIAGNOSIS — C50111 Malignant neoplasm of central portion of right female breast: Secondary | ICD-10-CM

## 2015-03-26 DIAGNOSIS — C50112 Malignant neoplasm of central portion of left female breast: Secondary | ICD-10-CM

## 2015-03-26 DIAGNOSIS — E2839 Other primary ovarian failure: Secondary | ICD-10-CM

## 2015-03-30 ENCOUNTER — Ambulatory Visit (HOSPITAL_COMMUNITY)
Admission: RE | Admit: 2015-03-30 | Discharge: 2015-03-30 | Disposition: A | Discharge status: Home / Self Care | Payer: Medicare Other | Source: Ambulatory Visit | Attending: Hematology and Oncology | Admitting: Hematology and Oncology

## 2015-03-30 ENCOUNTER — Telehealth: Payer: Self-pay | Admitting: *Deleted

## 2015-03-30 ENCOUNTER — Other Ambulatory Visit (HOSPITAL_BASED_OUTPATIENT_CLINIC_OR_DEPARTMENT_OTHER): Payer: Medicare Other

## 2015-03-30 ENCOUNTER — Other Ambulatory Visit: Payer: Self-pay | Admitting: Hematology and Oncology

## 2015-03-30 ENCOUNTER — Other Ambulatory Visit: Payer: Self-pay | Admitting: Oncology

## 2015-03-30 DIAGNOSIS — C50119 Malignant neoplasm of central portion of unspecified female breast: Secondary | ICD-10-CM

## 2015-03-30 DIAGNOSIS — C50112 Malignant neoplasm of central portion of left female breast: Secondary | ICD-10-CM

## 2015-03-30 DIAGNOSIS — C50811 Malignant neoplasm of overlapping sites of right female breast: Secondary | ICD-10-CM

## 2015-03-30 DIAGNOSIS — J700 Acute pulmonary manifestations due to radiation: Secondary | ICD-10-CM | POA: Diagnosis present

## 2015-03-30 DIAGNOSIS — N63 Unspecified lump in breast: Secondary | ICD-10-CM | POA: Diagnosis not present

## 2015-03-30 LAB — COMPREHENSIVE METABOLIC PANEL (CC13)
ALT: 19 U/L (ref 0–55)
AST: 20 U/L (ref 5–34)
Albumin: 3.1 g/dL — ABNORMAL LOW (ref 3.5–5.0)
Alkaline Phosphatase: 100 U/L (ref 40–150)
Anion Gap: 10 mEq/L (ref 3–11)
BILIRUBIN TOTAL: 0.45 mg/dL (ref 0.20–1.20)
BUN: 18.9 mg/dL (ref 7.0–26.0)
CO2: 26 meq/L (ref 22–29)
CREATININE: 0.9 mg/dL (ref 0.6–1.1)
Calcium: 9.7 mg/dL (ref 8.4–10.4)
Chloride: 104 mEq/L (ref 98–109)
EGFR: 76 mL/min/{1.73_m2} — AB (ref >90)
Glucose: 103 mg/dl (ref 70–140)
Potassium: 3.8 mEq/L (ref 3.5–5.1)
Sodium: 140 mEq/L (ref 136–145)
Total Protein: 7.9 g/dL (ref 6.4–8.3)

## 2015-03-30 MED ORDER — IOHEXOL 300 MG/ML  SOLN: 100.0000 mL | Freq: Once | INTRAMUSCULAR | Status: AC | PRN

## 2015-03-30 NOTE — Telephone Encounter (Signed)
I believe this is Dr Geralyn Flash pt-- it also appears she has not been seen here for some time; finally we don't know who is calling.  Patient will have to have an appt here if she wishes top have pain medication discussed.

## 2015-03-30 NOTE — Telephone Encounter (Signed)
TC from pt's daughter, michelle. She states her mother is having breast pain-it seems to have increased after a mammogram done 03/26/15. She is out of her percocet (90 tabs filled on 03/15/15) and not due to be filled until 04/14/15. Daughter asking for other options to ease this pain. Advised to take Tylenol 2 tabs every 6 hours as needed (and not any more frequently than that d/t toxicity) and to apply moist heat or heating pad to sore area. Advised to keep heat low on heating pad and to have a layer of fabric between heating pad and her skin.  Pt has appt with Dr. Lindi Adie on Friday and has CT scan scheduled for today.

## 2015-03-30 NOTE — Telephone Encounter (Signed)
Spoke with patient's daughter [name] that it is too early to fill Percocet as it was filled on 6/20. Advised her to have patient keep appt on 7/8 and Dr. Lindi Adie will address pain medication at that point. She verbalized understanding.

## 2015-03-30 NOTE — Telephone Encounter (Signed)
Received call from daughter Wellington Hampshire requesting refill of Percocet  5/325 for pt. Michelle's  Phone     [phone].

## 2015-04-02 ENCOUNTER — Encounter: Payer: Self-pay | Admitting: Hematology and Oncology

## 2015-04-02 ENCOUNTER — Telehealth: Payer: Self-pay | Admitting: Hematology and Oncology

## 2015-04-02 ENCOUNTER — Ambulatory Visit (HOSPITAL_BASED_OUTPATIENT_CLINIC_OR_DEPARTMENT_OTHER): Payer: Medicare Other | Admitting: Hematology and Oncology

## 2015-04-02 VITALS — BP 137/70 | HR 89 | Temp 98.1°F | Resp 18 | Ht 63.0 in | Wt 142.9 lb

## 2015-04-02 DIAGNOSIS — J700 Acute pulmonary manifestations due to radiation: Secondary | ICD-10-CM

## 2015-04-02 DIAGNOSIS — M8580 Other specified disorders of bone density and structure, unspecified site: Secondary | ICD-10-CM

## 2015-04-02 DIAGNOSIS — G622 Polyneuropathy due to other toxic agents: Secondary | ICD-10-CM

## 2015-04-02 DIAGNOSIS — C50811 Malignant neoplasm of overlapping sites of right female breast: Secondary | ICD-10-CM

## 2015-04-02 DIAGNOSIS — C50119 Malignant neoplasm of central portion of unspecified female breast: Secondary | ICD-10-CM

## 2015-04-02 DIAGNOSIS — C773 Secondary and unspecified malignant neoplasm of axilla and upper limb lymph nodes: Secondary | ICD-10-CM | POA: Diagnosis not present

## 2015-04-02 MED ORDER — OXYCODONE-ACETAMINOPHEN 5-325 MG PO TABS: 1.0000 | ORAL_TABLET | ORAL | Status: DC | PRN

## 2015-04-02 MED ORDER — LORAZEPAM 0.5 MG PO TABS: ORAL_TABLET | ORAL | Status: DC

## 2015-04-02 NOTE — Assessment & Plan Note (Signed)
1. 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 N0 M0 ER PR 0% HER-2 negative status post radiation therapy  2. Severe peripheral neuropathy: Currently on gabapentin, sees neurology sural nerve biopsy was performed by them. No major improvement in her symptoms.  3. Breast Cancer Surveillance: 1. Breast exam 04/02/2015: Normal 2. Mammogram 03/26/2015 No abnormalities. Postsurgical changes. Breast Density Category B. I recommended that she get 3-D mammograms for surveillance. Discussed the differences between different breast density categories. 3. Bone density 03/26/2015: T score -1.9 osteopenia: Currently on calcium and vitamin D 4. Radiation pneumonitis: Recent chest CT scan showed right upper lung scarring. Patient does not have any cough or shortness of breath.  Return to clinic in 6 months for follow-up

## 2015-04-02 NOTE — Telephone Encounter (Signed)
per pof to sch pt appt-mailed pt copy of avs

## 2015-04-02 NOTE — Progress Notes (Signed)
Patient Care Team: Chauncey Cruel, MD as Consulting Physician (Oncology)  DIAGNOSIS: Cancer of central portion of female breast   Staging form: Breast, AJCC 7th Edition     Clinical: Stage IIB (T2, N1, cM0) - Signed by Thea Silversmith, MD on 10/24/2013       Staging comments: Staged at breast conference 5.21.14      Pathologic: No stage assigned - Unsigned   SUMMARY OF ONCOLOGIC HISTORY: Oncology History   Dose dense Adriamycin Cytoxan     Cancer of central portion of female breast   01/31/2013 Mammogram Area of distortion upper-outer quadrant right breast diffuse calcifications in both breasts 2.5 cm with prominent right axillary lymph node 2.5 cm   01/31/2013 Initial Diagnosis Cancer of central portion of female breast: 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 Ki-67 84%; sentinel lymph node sampling 03/10/2013 showed1/2 SLN pos PR 3% PR 6% Ki-67 15% HER-2 negative   02/10/2013 Breast MRI Right breast: Rim enhancing necrotic mass 3.4 cm with a 2 cm level I axillary lymph node   03/17/2013 - 08/25/2013 Neo-Adjuvant Chemotherapy Followed by weekly Taxol and carboplatin x12; chemotherapy-induced anemia and peripheral neuropathy were the complications   09/27/2438 Surgery Right breast lumpectomy scatter residual invasive mass 1.1 cm with [facility] 0/11 lymph nodes no cancer seen: ER 0% PR 0% insufficient to mark it on HER-2 testing   11/12/2013 - 12/24/2013 Radiation Therapy Radiation therapy to the breast and axilla    CHIEF COMPLIANT: Follow-up of breast cancer  INTERVAL HISTORY: Tabitha Ramirez is a 69 year old with above-mentioned history of right breast cancer treated with neo-adjuvant chemotherapy followed by surgery and radiation therapy. She had severe peripheral neuropathy related to chemotherapy and continues to have the same symptoms. She had seen urology. She does take gabapentin. It helps her slightly but does not do a whole lot. She continues to have pain in the breast as well as  extremities. She takes Percocets for pain relief. Also takes Ativan to help her sleep. We are prescribing both of these.  REVIEW OF SYSTEMS:   Constitutional: Denies fevers, chills or abnormal weight loss Eyes: Denies blurriness of vision Ears, nose, mouth, throat, and face: Denies mucositis or sore throat Respiratory: Denies cough, dyspnea or wheezes Cardiovascular: Denies palpitation, chest discomfort or lower extremity swelling Gastrointestinal:  Denies nausea, heartburn or change in bowel habits Skin: Denies abnormal skin rashes Lymphatics: Denies new lymphadenopathy or easy bruising Neurological:Denies numbness, tingling or new weaknesses Behavioral/Psych: Mood is stable, no new changes  Breast: Right breast pain All other systems were reviewed with the patient and are negative.  I have reviewed the past medical history, past surgical history, social history and family history with the patient and they are unchanged from previous note.  ALLERGIES:  is allergic to penicillins.  MEDICATIONS:  Current Outpatient Prescriptions  Medication Sig Dispense Refill  . acetaminophen (TYLENOL) 500 MG tablet Take 1,000 mg by mouth every 6 (six) hours as needed for mild pain.    . B Complex-C (B-COMPLEX WITH VITAMIN C) tablet Take 1 tablet by mouth daily.    Marland Kitchen gabapentin (NEURONTIN) 300 MG capsule Take 1 capsule (300 mg total) by mouth 2 (two) times daily. 60 capsule 11  . levothyroxine (SYNTHROID, LEVOTHROID) 75 MCG tablet Take 75 mcg by mouth daily before breakfast.    . LORazepam (ATIVAN) 0.5 MG tablet TAKE 1 TABLET BY MOUTH AT BEDTIME AS NEEDED FOR SLEEP OR ANXIETY 30 tablet 0  . ondansetron (ZOFRAN) 8 MG tablet TAKE  1 TABLET (8 MG TOTAL) BY MOUTH EVERY 12 (TWELVE) HOURS AS NEEDED FOR NAUSEA. 20 tablet 0  . OVER THE COUNTER MEDICATION Take 15 mLs by mouth daily. MIRACLE 2000 MULTIVITE LIQUID    . oxyCODONE-acetaminophen (PERCOCET/ROXICET) 5-325 MG per tablet Take 1 tablet by mouth every 4  (four) hours as needed for severe pain. 90 tablet 0  . pantoprazole (PROTONIX) 40 MG tablet Take 40 mg by mouth daily.    Marland Kitchen triamterene-hydrochlorothiazide (MAXZIDE-25) 37.5-25 MG per tablet Take 0.5 tablets by mouth every other day.    . vitamin B-12 (CYANOCOBALAMIN) 1000 MCG tablet Take 1,000 mcg by mouth daily.     No current facility-administered medications for this visit.    PHYSICAL EXAMINATION: ECOG PERFORMANCE STATUS: 1 - Symptomatic but completely ambulatory  Filed Vitals:   04/02/15 1100  BP: 137/70  Pulse: 89  Temp: 98.1 F (36.7 C)  Resp: 18   Filed Weights   04/02/15 1100  Weight: 142 lb 14.4 oz (64.819 kg)    GENERAL:alert, no distress and comfortable SKIN: skin color, texture, turgor are normal, no rashes or significant lesions EYES: normal, Conjunctiva are pink and non-injected, sclera clear OROPHARYNX:no exudate, no erythema and lips, buccal mucosa, and tongue normal  NECK: supple, thyroid normal size, non-tender, without nodularity LYMPH:  no palpable lymphadenopathy in the cervical, axillary or inguinal LUNGS: clear to auscultation and percussion with normal breathing effort HEART: regular rate & rhythm and no murmurs and no lower extremity edema ABDOMEN:abdomen soft, non-tender and normal bowel sounds Musculoskeletal:no cyanosis of digits and no clubbing  NEURO: alert & oriented x 3 with fluent speech, no focal motor/sensory deficits BREAST: No palpable masses or nodules in either right or left breasts. Severe tenderness in the right breast to palpation. No palpable axillary supraclavicular or infraclavicular adenopathy no breast tenderness or nipple discharge. (exam performed in the presence of a chaperone)  LABORATORY DATA:  I have reviewed the data as listed   Chemistry      Component Value Date/Time   NA 140 03/30/2015 0944   NA 140 05/12/2014 1238   K 3.8 03/30/2015 0944   K 3.4* 05/12/2014 1238   CL 100 05/12/2014 1238   CL 100 03/13/2013 0918    CO2 26 03/30/2015 0944   CO2 27 05/12/2014 1238   BUN 18.9 03/30/2015 0944   BUN 18 05/12/2014 1238   CREATININE 0.9 03/30/2015 0944   CREATININE 0.82 05/12/2014 1238   CREATININE 0.94 11/29/2011 1535      Component Value Date/Time   CALCIUM 9.7 03/30/2015 0944   CALCIUM 9.7 05/12/2014 1238   ALKPHOS 100 03/30/2015 0944   ALKPHOS 89 09/16/2013 1000   AST 20 03/30/2015 0944   AST 20 09/16/2013 1000   ALT 19 03/30/2015 0944   ALT 21 09/16/2013 1000   BILITOT 0.45 03/30/2015 0944   BILITOT 0.3 09/16/2013 1000       Lab Results  Component Value Date   WBC 6.1 06/22/2014   HGB 10.7* 06/22/2014   HCT 32.5* 06/22/2014   MCV 102.2* 06/22/2014   PLT 284 06/22/2014   NEUTROABS 2.8 06/22/2014     RADIOGRAPHIC STUDIES: I have personally reviewed the radiology reports and agreed with their findings. CT chest showing radiation pneumonitis right upper lung Mammogram 03/26/2015 normal category B density Bone density 03/26/2015: T score -1.9 osteopenia  ASSESSMENT & PLAN:  Cancer of central portion of female breast 1. 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 N0 M0 ER PR 0% HER-2 negative status post radiation therapy  2. Severe peripheral neuropathy: Currently on gabapentin, sees neurology sural nerve biopsy was performed by them. No major improvement in her symptoms.  3. Breast Cancer Surveillance: 1. Breast exam 04/02/2015: Normal 2. Mammogram 03/26/2015 No abnormalities. Postsurgical changes. Breast Density Category B. I recommended that she get 3-D mammograms for surveillance. Discussed the differences between different breast density categories. 3. Bone density 03/26/2015: T score -1.9 osteopenia: Currently on calcium and vitamin D 4. Radiation pneumonitis: Recent chest CT scan showed right upper lung scarring. Patient does not have any cough or shortness of  breath.  Return to clinic in 6 months for follow-up       No orders of the defined types were placed in this encounter.   The patient has a good understanding of the overall plan. she agrees with it. she will call with any problems that may develop before the next visit here.   Rulon Eisenmenger, MD

## 2015-04-07 ENCOUNTER — Other Ambulatory Visit: Payer: Self-pay | Admitting: Hematology and Oncology

## 2015-04-09 ENCOUNTER — Telehealth: Payer: Self-pay

## 2015-04-09 ENCOUNTER — Other Ambulatory Visit: Payer: Self-pay | Admitting: *Deleted

## 2015-04-09 MED ORDER — TRIAMTERENE-HCTZ 37.5-25 MG PO TABS: 0.5000 | ORAL_TABLET | ORAL | Status: DC

## 2015-04-09 NOTE — Telephone Encounter (Signed)
Discussed with Deb.  Patient needs to come in at least yearly and establish with a PCP to continue to get refills from this office.  Last seen by Dr. Eyvonne Mechanic in 2015.  Will refill 1 month supply only. If she does not come to appointments, no further refills.

## 2015-04-09 NOTE — Telephone Encounter (Signed)
Note on pt given to C. Boone regarding an appt. Aware [facility] will only give one month med till appt.

## 2015-04-09 NOTE — Telephone Encounter (Signed)
Bone density report rcvd from [facility].  Reviewed by Dr. Lindi Adie.  Sent to scan.

## 2015-04-27 ENCOUNTER — Telehealth: Payer: Self-pay | Admitting: *Deleted

## 2015-04-27 DIAGNOSIS — C50119 Malignant neoplasm of central portion of unspecified female breast: Secondary | ICD-10-CM

## 2015-04-27 MED ORDER — OXYCODONE-ACETAMINOPHEN 5-325 MG PO TABS: 1.0000 | ORAL_TABLET | ORAL | Status: DC | PRN

## 2015-04-27 NOTE — Addendum Note (Signed)
Addended by: Prentiss Bells on: 04/27/2015 02:51 PM   Modules accepted: Orders

## 2015-04-27 NOTE — Telephone Encounter (Signed)
PT.'S DAUGHTER WOULD LIKE TO PICK UP THE OXYCODONE/ACETAMINOPHEN 5/325 TODAY. PLEASE CALL DAUGHTER AT [phone] WHEN PRESCRIPTION IS READY.

## 2015-04-27 NOTE — Telephone Encounter (Addendum)
Let pt daughter know prescription ready.  Can be picked up before 4 pm.  She voiced understanding.    Prescription placed in book.

## 2015-05-24 ENCOUNTER — Other Ambulatory Visit: Payer: Self-pay | Admitting: Hematology and Oncology

## 2015-05-24 ENCOUNTER — Telehealth: Payer: Self-pay | Admitting: *Deleted

## 2015-05-24 ENCOUNTER — Other Ambulatory Visit: Payer: Self-pay | Admitting: *Deleted

## 2015-05-24 DIAGNOSIS — C50119 Malignant neoplasm of central portion of unspecified female breast: Secondary | ICD-10-CM

## 2015-05-24 MED ORDER — OXYCODONE-ACETAMINOPHEN 5-325 MG PO TABS: 1.0000 | ORAL_TABLET | ORAL | Status: DC | PRN

## 2015-05-24 NOTE — Telephone Encounter (Signed)
Daughter called on mom's behalf.  "Tabitha Ramirez calling to request refill for my mother who needs a refill on Percocet.  I will be in my [place] office on Tuesday and Wednesday.  Please call me at [phone] if any questions."

## 2015-05-24 NOTE — Telephone Encounter (Signed)
Called patient's daughter [name] that rx is ready to be picked up.

## 2015-06-05 ENCOUNTER — Other Ambulatory Visit: Payer: Self-pay | Admitting: Internal Medicine

## 2015-06-15 ENCOUNTER — Ambulatory Visit: Payer: Medicare Other | Admitting: Neurology

## 2015-06-17 ENCOUNTER — Other Ambulatory Visit: Payer: Self-pay | Admitting: *Deleted

## 2015-06-17 ENCOUNTER — Other Ambulatory Visit: Payer: Self-pay

## 2015-06-17 DIAGNOSIS — C50119 Malignant neoplasm of central portion of unspecified female breast: Secondary | ICD-10-CM

## 2015-06-17 MED ORDER — OXYCODONE-ACETAMINOPHEN 5-325 MG PO TABS: 1.0000 | ORAL_TABLET | ORAL | Status: DC | PRN

## 2015-06-17 NOTE — Telephone Encounter (Signed)
WOULD LIKE TO PICK UP PRESCRIPTION TOMORROW.

## 2015-06-17 NOTE — Telephone Encounter (Signed)
Advised daughter Selinda Eon Hylton-Smith that percocet prescription is ready and can be picked up between 9 and 4.  She voiced understanding.

## 2015-06-17 NOTE — Telephone Encounter (Signed)
VOICE MAIL AT 8:22AM PT.'S DAUGHTER CALLED TO CORRECT THE DOSE OF PERCOCET SHE REQUESTED FOR HER MOTHER. THE CORRECT DOSE WAS ENTERED IN THE COMMENT SECTION AFTER MEDICATION LIST WAS NOTED.

## 2015-06-24 ENCOUNTER — Other Ambulatory Visit: Payer: Self-pay | Admitting: Hematology and Oncology

## 2015-06-24 DIAGNOSIS — C50119 Malignant neoplasm of central portion of unspecified female breast: Secondary | ICD-10-CM

## 2015-07-13 ENCOUNTER — Telehealth: Payer: Self-pay | Admitting: *Deleted

## 2015-07-13 ENCOUNTER — Other Ambulatory Visit: Payer: Self-pay | Admitting: Oncology

## 2015-07-13 ENCOUNTER — Other Ambulatory Visit: Payer: Self-pay

## 2015-07-13 DIAGNOSIS — C50119 Malignant neoplasm of central portion of unspecified female breast: Secondary | ICD-10-CM

## 2015-07-13 MED ORDER — OXYCODONE-ACETAMINOPHEN 5-325 MG PO TABS: 1.0000 | ORAL_TABLET | ORAL | Status: DC | PRN

## 2015-07-13 NOTE — Telephone Encounter (Signed)
This is dr Geralyn Flash patient

## 2015-07-13 NOTE — Telephone Encounter (Signed)
Prescription for percocet refilled.  Patient called and VM left that they can pick the prescription up anytime today.

## 2015-07-13 NOTE — Telephone Encounter (Signed)
Call from patient's daughter [name] asking if prescription is ready for pick up.  Informed her it is ready.  Patient lives in [place] with [name], will be here within an hour to pick up prescription.

## 2015-07-13 NOTE — Telephone Encounter (Signed)
VM message received @ 8:37 am from caregiver requesting refill on pt's percocet 5/325. Last filled on 06/17/15. Caregiver can pick up either tomorrow or Thursday. Please call her and let her know when prescription is available for pick up.

## 2015-07-26 ENCOUNTER — Other Ambulatory Visit: Payer: Self-pay | Admitting: Hematology and Oncology

## 2015-08-06 ENCOUNTER — Telehealth: Payer: Self-pay | Admitting: *Deleted

## 2015-08-06 ENCOUNTER — Other Ambulatory Visit: Payer: Self-pay | Admitting: *Deleted

## 2015-08-06 DIAGNOSIS — C50119 Malignant neoplasm of central portion of unspecified female breast: Secondary | ICD-10-CM

## 2015-08-06 MED ORDER — OXYCODONE-ACETAMINOPHEN 5-325 MG PO TABS: 1.0000 | ORAL_TABLET | ORAL | Status: DC | PRN

## 2015-08-06 NOTE — Telephone Encounter (Signed)
Left message for daughter [name] that rx ready for p/u.

## 2015-08-06 NOTE — Telephone Encounter (Signed)
TC from daughter requesting refill for her mother's Percocet 5/325. Last filled on 07/13/15 for 90 tabs. Request is 1 week early. Daughter states she thinks her mother takes 3-4 /day  Please call daughter week prescription is ready for pick up @ [phone]

## 2015-08-24 ENCOUNTER — Other Ambulatory Visit: Payer: Self-pay | Admitting: Hematology and Oncology

## 2015-08-25 NOTE — Telephone Encounter (Signed)
Chart reviewed.  Phoned in to [facility]

## 2015-08-30 ENCOUNTER — Other Ambulatory Visit: Payer: Self-pay

## 2015-08-30 DIAGNOSIS — C50119 Malignant neoplasm of central portion of unspecified female breast: Secondary | ICD-10-CM

## 2015-08-30 MED ORDER — OXYCODONE-ACETAMINOPHEN 5-325 MG PO TABS: 1.0000 | ORAL_TABLET | ORAL | Status: DC | PRN

## 2015-08-30 NOTE — Telephone Encounter (Signed)
Let daughter Arabella Merles know that prescription is ready for pickup today before 4 pm.

## 2015-08-30 NOTE — Telephone Encounter (Signed)
Voicemail from daughter asking for refill on Percocet 5-325 mg.  Call in from her at 1230.  This nurse advised script has been printed so she should be able to drive in today from [place] to pick up script.

## 2015-09-11 ENCOUNTER — Other Ambulatory Visit: Payer: Self-pay | Admitting: Neurology

## 2015-09-21 ENCOUNTER — Telehealth: Payer: Self-pay | Admitting: *Deleted

## 2015-09-21 NOTE — Telephone Encounter (Signed)
"  My mother will need refill on oxycodone-acetaminophen on 09-26-2014 but you all are closed.  I will be in my [facility] today, tomorrow and am available to drive to [place] on Friday to get a refill.  Please call me [phone] and let me know what to do to get the refill."

## 2015-09-22 ENCOUNTER — Other Ambulatory Visit: Payer: Self-pay | Admitting: *Deleted

## 2015-09-22 DIAGNOSIS — C50119 Malignant neoplasm of central portion of unspecified female breast: Secondary | ICD-10-CM

## 2015-09-22 MED ORDER — OXYCODONE-ACETAMINOPHEN 5-325 MG PO TABS: 1.0000 | ORAL_TABLET | ORAL | Status: DC | PRN

## 2015-09-30 ENCOUNTER — Other Ambulatory Visit: Payer: Self-pay | Admitting: Hematology and Oncology

## 2015-09-30 NOTE — Telephone Encounter (Signed)
Chart Reviewed.

## 2015-10-07 NOTE — Assessment & Plan Note (Signed)
1. 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 N0 M0 ER PR 0% HER-2 negative status post radiation therapy  2. Severe peripheral neuropathy: Currently on gabapentin, sees neurology sural nerve biopsy was performed by them. No major improvement in her symptoms.  3. Breast Cancer Surveillance: 1. Breast exam 10/08/15: Normal 2. Mammogram 03/26/2015 No abnormalities. Postsurgical changes. Breast Density Category B. I recommended that she get 3-D mammograms for surveillance. Discussed the differences between different breast density categories. 3. Bone density 03/26/2015: T score -1.9 osteopenia: Currently on calcium and vitamin D 4. Radiation pneumonitis: Recent chest CT scan showed right upper lung scarring. Patient does not have any cough or shortness of breath.  Return to clinic in 6 months for follow-up

## 2015-10-08 ENCOUNTER — Telehealth: Payer: Self-pay | Admitting: Hematology and Oncology

## 2015-10-08 ENCOUNTER — Encounter: Payer: Self-pay | Admitting: Hematology and Oncology

## 2015-10-08 ENCOUNTER — Ambulatory Visit (HOSPITAL_BASED_OUTPATIENT_CLINIC_OR_DEPARTMENT_OTHER): Payer: Medicare Other | Admitting: Hematology and Oncology

## 2015-10-08 VITALS — BP 145/75 | HR 94 | Temp 97.4°F | Resp 18 | Ht 63.0 in | Wt 142.2 lb

## 2015-10-08 DIAGNOSIS — M8580 Other specified disorders of bone density and structure, unspecified site: Secondary | ICD-10-CM | POA: Diagnosis not present

## 2015-10-08 DIAGNOSIS — C50119 Malignant neoplasm of central portion of unspecified female breast: Secondary | ICD-10-CM

## 2015-10-08 DIAGNOSIS — G620 Drug-induced polyneuropathy: Secondary | ICD-10-CM | POA: Diagnosis not present

## 2015-10-08 DIAGNOSIS — C50111 Malignant neoplasm of central portion of right female breast: Secondary | ICD-10-CM

## 2015-10-08 DIAGNOSIS — T451X5A Adverse effect of antineoplastic and immunosuppressive drugs, initial encounter: Secondary | ICD-10-CM

## 2015-10-08 MED ORDER — OXYCODONE-ACETAMINOPHEN 5-325 MG PO TABS: 1.0000 | ORAL_TABLET | ORAL | Status: DC | PRN

## 2015-10-08 NOTE — Progress Notes (Signed)
Unable to get in to room prior to MD visit.  No assessment performed.   Meds & allergies reconciled from printed pt med list provided by MD.

## 2015-10-08 NOTE — Telephone Encounter (Signed)
Appointments made and avs printed for patient °

## 2015-10-08 NOTE — Progress Notes (Signed)
Patient Care Team: Chauncey Cruel, MD as Consulting Physician (Oncology)  DIAGNOSIS: Cancer of central portion of female breast [facility])   Staging form: Breast, AJCC 7th Edition     Clinical: Stage IIB (T2, N1, cM0) - Signed by Thea Silversmith, MD on 10/24/2013       Staging comments: Staged at breast conference 5.21.14      Pathologic: No stage assigned - Unsigned   SUMMARY OF ONCOLOGIC HISTORY: Oncology History       Cancer of central portion of right female breast ([place])   01/31/2013 Mammogram Area of distortion upper-outer quadrant right breast diffuse calcifications in both breasts 2.5 cm with prominent right axillary lymph node 2.5 cm   01/31/2013 Initial Diagnosis Cancer of central portion of female breast: 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 Ki-67 84%; sentinel lymph node sampling 03/10/2013 showed1/2 SLN pos PR 3% PR 6% Ki-67 15% HER-2 negative   02/10/2013 Breast MRI Right breast: Rim enhancing necrotic mass 3.4 cm with a 2 cm level I axillary lymph node   03/17/2013 - 08/25/2013 Neo-Adjuvant Chemotherapy Followed by weekly Taxol and carboplatin x12; chemotherapy-induced anemia and peripheral neuropathy were the complications   01/27/7321 Surgery Right breast lumpectomy scatter residual invasive mass 1.1 cm with [facility] 0/11 lymph nodes no cancer seen: ER 0% PR 0% insufficient to mark it on HER-2 testing   11/12/2013 - 12/24/2013 Radiation Therapy Radiation therapy to the breast and axilla    CHIEF COMPLIANT: follow-up of breast cancer  INTERVAL HISTORY: Tabitha Ramirez is a 70 year old with above-mentioned history of right breast cancer treated with neoadjuvant chemotherapy followed by lumpectomy and radiation. 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 Without the pain medication her symptoms would be very severe. She denies any lumps or nodules in the breasts.  REVIEW OF SYSTEMS:   Constitutional: Denies fevers, chills or abnormal weight  loss Eyes: Denies blurriness of vision Ears, nose, mouth, throat, and face: Denies mucositis or sore throat Respiratory: Denies cough, dyspnea or wheezes Cardiovascular: Denies palpitation, chest discomfort Gastrointestinal:  Denies nausea, heartburn or change in bowel habits Skin: Denies abnormal skin rashes Lymphatics: Denies new lymphadenopathy or easy bruising Neurological:severe neuropathy Behavioral/Psych: Mood is stable, no new changes  Extremities: No lower extremity edema Breast:  Patient reports that she gets phantom pains in the right breast area. All other systems were reviewed with the patient and are negative.  I have reviewed the past medical history, past surgical history, social history and family history with the patient and they are unchanged from previous note.  ALLERGIES:  is allergic to penicillins.  MEDICATIONS:  Current Outpatient Prescriptions  Medication Sig Dispense Refill  . acetaminophen (TYLENOL) 500 MG tablet Take 1,000 mg by mouth every 6 (six) hours as needed for mild pain.    Marland Kitchen gabapentin (NEURONTIN) 300 MG capsule TAKE 1 CAPSULE (300 MG TOTAL) BY MOUTH 2 (TWO) TIMES DAILY. 60 capsule 0  . levothyroxine (SYNTHROID, LEVOTHROID) 75 MCG tablet Take 75 mcg by mouth daily before breakfast.    . LORazepam (ATIVAN) 0.5 MG tablet TAKE 1 TABLET AT BEDTIME AS NEEDED FOR ANXIETY/SLEEP 30 tablet 2  . ondansetron (ZOFRAN) 8 MG tablet TAKE 1 TABLET (8 MG TOTAL) BY MOUTH EVERY 12 (TWELVE) HOURS AS NEEDED FOR NAUSEA. 20 tablet 0  . OVER THE COUNTER MEDICATION Take 15 mLs by mouth daily. MIRACLE 2000 MULTIVITE LIQUID    . oxyCODONE-acetaminophen (PERCOCET/ROXICET) 5-325 MG tablet Take 1 tablet by mouth every 4 (  four) hours as needed for severe pain. 120 tablet 0  . triamterene-hydrochlorothiazide (MAXZIDE-25) 37.5-25 MG per tablet Take 0.5 tablets by mouth every other day. 15 tablet 0   No current facility-administered medications for this visit.    PHYSICAL  EXAMINATION: ECOG PERFORMANCE STATUS: 2 - Symptomatic, <50% confined to bed  Filed Vitals:   10/08/15 1117  BP: 145/75  Pulse: 94  Temp: 97.4 F (36.3 C)  Resp: 18   Filed Weights   10/08/15 1117  Weight: 142 lb 3.2 oz (64.501 kg)    GENERAL:alert, no distress and comfortable SKIN: skin color, texture, turgor are normal, no rashes or significant lesions EYES: normal, Conjunctiva are pink and non-injected, sclera clear OROPHARYNX:no exudate, no erythema and lips, buccal mucosa, and tongue normal  NECK: supple, thyroid normal size, non-tender, without nodularity LYMPH:  no palpable lymphadenopathy in the cervical, axillary or inguinal LUNGS: clear to auscultation and percussion with normal breathing effort HEART: regular rate & rhythm and no murmurs and no lower extremity edema ABDOMEN:abdomen soft, non-tender and normal bowel sounds MUSCULOSKELETAL:no cyanosis of digits and no clubbing  NEURO: alert & oriented x 3 with fluent speech, grade 3 peripheral neuropathy EXTREMITIES: No lower extremity edema BREAST: no palpable lumps or nodules in the left breast. Right chest wall is without any nodules.. (exam performed in the presence of a chaperone)  LABORATORY DATA:  I have reviewed the data as listed   Chemistry      Component Value Date/Time   NA 140 03/30/2015 0944   NA 140 05/12/2014 1238   K 3.8 03/30/2015 0944   K 3.4* 05/12/2014 1238   CL 100 05/12/2014 1238   CL 100 03/13/2013 0918   CO2 26 03/30/2015 0944   CO2 27 05/12/2014 1238   BUN 18.9 03/30/2015 0944   BUN 18 05/12/2014 1238   CREATININE 0.9 03/30/2015 0944   CREATININE 0.82 05/12/2014 1238   CREATININE 0.94 11/29/2011 1535      Component Value Date/Time   CALCIUM 9.7 03/30/2015 0944   CALCIUM 9.7 05/12/2014 1238   ALKPHOS 100 03/30/2015 0944   ALKPHOS 89 09/16/2013 1000   AST 20 03/30/2015 0944   AST 20 09/16/2013 1000   ALT 19 03/30/2015 0944   ALT 21 09/16/2013 1000   BILITOT 0.45 03/30/2015 0944    BILITOT 0.3 09/16/2013 1000       Lab Results  Component Value Date   WBC 6.1 06/22/2014   HGB 10.7* 06/22/2014   HCT 32.5* 06/22/2014   MCV 102.2* 06/22/2014   PLT 284 06/22/2014   NEUTROABS 2.8 06/22/2014     ASSESSMENT & PLAN:  Cancer of central portion of female breast 1. 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 N0 M0 ER PR 0% HER-2 negative status post radiation therapy  2. Severe peripheral neuropathy: Currently on gabapentin, sees neurology sural nerve biopsy was performed by them. No major improvement in her symptoms.currently takes pain medications  3. Breast Cancer Surveillance: 1. Breast exam 10/08/15: Normal 2. Mammogram 03/26/2015 No abnormalities. Postsurgical changes. Breast Density Category B. I recommended that she get 3-D mammograms for surveillance. Discussed the differences between different breast density categories. 3. Bone density 03/26/2015: T score -1.9 osteopenia: Currently on calcium and vitamin D 4. Radiation pneumonitis: chest CT scan 03/30/2015 showed right upper lung scarring. Patient does not have any cough or shortness of breath.  Return to clinic in  6 months for follow-up    No orders of the defined types were placed in this encounter.   The patient has a good understanding of the overall plan. she agrees with it. she will call with any problems that may develop before the next visit here.   Rulon Eisenmenger, MD 10/08/2015

## 2015-10-13 ENCOUNTER — Other Ambulatory Visit: Payer: Self-pay | Admitting: Neurology

## 2015-10-27 ENCOUNTER — Telehealth: Payer: Self-pay

## 2015-10-27 DIAGNOSIS — C50119 Malignant neoplasm of central portion of unspecified female breast: Secondary | ICD-10-CM

## 2015-10-27 MED ORDER — OXYCODONE-ACETAMINOPHEN 5-325 MG PO TABS: 1.0000 | ORAL_TABLET | ORAL | Status: DC | PRN

## 2015-10-27 NOTE — Telephone Encounter (Signed)
Daughter called requesting percocet 5-325 refill. She is driving from [place] so wants to know when it is ready. Pt will be out of pills Friday evening.

## 2015-10-27 NOTE — Addendum Note (Signed)
Addended by: Prentiss Bells on: 10/27/2015 12:10 PM   Modules accepted: Orders

## 2015-10-28 ENCOUNTER — Telehealth: Payer: Self-pay | Admitting: *Deleted

## 2015-10-28 NOTE — Telephone Encounter (Signed)
FYI "I'm on MyChart reading about pneumonia, tetanus, shingle immunizations and Hepatitis C screening.  I didn't know there was a shingles vaccine.  Learned I can get some of these eat a pharmacy. Where can I Hepatitis C Screening?"    Informed her to contact PCP.

## 2015-11-17 ENCOUNTER — Telehealth: Payer: Self-pay

## 2015-11-17 ENCOUNTER — Telehealth: Payer: Self-pay | Admitting: *Deleted

## 2015-11-17 DIAGNOSIS — C50119 Malignant neoplasm of central portion of unspecified female breast: Secondary | ICD-10-CM

## 2015-11-17 MED ORDER — OXYCODONE-ACETAMINOPHEN 5-325 MG PO TABS: 1.0000 | ORAL_TABLET | ORAL | Status: DC | PRN

## 2015-11-17 NOTE — Telephone Encounter (Signed)
Please call Adele Barthel smith-hilton, the daughter, when ready for pickup. Her # [phone].

## 2015-11-17 NOTE — Telephone Encounter (Signed)
Daughter called stating that her mother's PCP will be putting in the order for PT so we do not have to.

## 2015-11-17 NOTE — Telephone Encounter (Signed)
Opened in error

## 2015-11-17 NOTE — Telephone Encounter (Signed)
Daughter called requesting "refill for twenty day supply of pain medicine.  Last fill was 10-27-2015 for Percocet 5/325 mg qty 120 pills, take 1 q 4hrs.  Will be in [place] today for pick up. Have not heard from any Physical therapy department for mom's neuropathy to hands that are now starting to curve.  She has neuropathy to hands, arthritis and trouble using hands worse.  If Therapy could be in [place] that would be great.  I could bring her to [facility] if needed but can only bring her one day a week to [facility]."  Will notify Dr. Lindi Adie.

## 2015-11-17 NOTE — Telephone Encounter (Signed)
Let Ms. Smith-Hylton know that Rx would be ready for pick up today before 4.  She confirmed that patient is living with her in [place].  Writer offered to contact home health in [place].  She reports pt does not want PT from home health, she would like to come in to a facility for therapy.  Writer advised her I would look for a PT facility and let her know.

## 2015-12-01 ENCOUNTER — Ambulatory Visit (INDEPENDENT_AMBULATORY_CARE_PROVIDER_SITE_OTHER): Payer: Medicare Other | Admitting: Physician Assistant

## 2015-12-01 VITALS — BP 134/74 | HR 92 | Temp 98.7°F | Resp 18 | Ht 64.0 in | Wt 141.0 lb

## 2015-12-01 DIAGNOSIS — H109 Unspecified conjunctivitis: Secondary | ICD-10-CM

## 2015-12-01 MED ORDER — POLYMYXIN B-TRIMETHOPRIM 10000-0.1 UNIT/ML-% OP SOLN: 2.0000 [drp] | Freq: Four times a day (QID) | OPHTHALMIC | Status: DC

## 2015-12-01 NOTE — Progress Notes (Signed)
12/01/2015 1:29 PM   DOB: October 18, 1945 / MRN: [id number]  SUBJECTIVE:  Tabitha Ramirez is a 70 y.o. female presenting for "swelling" of her left eye that started yesterday and is worsening.  States the problem started with the right eye and that eye is now improving.  She denies frank eye pain and trauma, however does complain of mild irritation bilaterally.  She denies changes in vision, nausea, photophobia, and HA today.   She is allergic to penicillins.   She  has a past medical history of Heart murmur; Hypertension; Anemia; Allergy; Migraines; History of blood transfusion; GERD (gastroesophageal reflux disease); Breast cancer ([place]); Arthritis; TMJ (temporomandibular joint syndrome); and Radiation (11/05/13-12/24/13).    She  reports that she has quit smoking. Her smoking use included Cigarettes. She quit after 1 year of use. She has never used smokeless tobacco. She reports that she does not drink alcohol or use illicit drugs. She  reports that she does not currently engage in sexual activity but has had female partners. She reports using the following method of birth control/protection: Surgical. The patient  has past surgical history that includes Portacath placement (N/A, 03/10/2013); Axillary lymph node biopsy (Right, 03/10/2013); Esophagogastroduodenoscopy (N/A, 04/28/2013); Colonoscopy (N/A, 04/30/2013); Port-a-cath removal (09/29/2013); Breast lumpectomy (Right, 09/29/2013); Total abdominal hysterectomy; Tonsillectomy; Mastectomy (Right, 09/29/2013); Breast lumpectomy with needle localization and axillary lymph node dissection (Right, 09/29/2013); Port-a-cath removal (Left, 09/29/2013); and Sural nerve bx (Right, 05/13/2014).  Her family history includes Cancer (age of onset: 71) in her maternal grandmother and mother; Colon cancer in her brother; Congestive Heart Failure in her brother and mother; Diabetes (age of onset: 35) in her brother; Heart disease in her brother.  ROS  Per HPI   Problem list and  medications reviewed and updated by myself where necessary, and exist elsewhere in the encounter.   OBJECTIVE:  BP 134/74 mmHg  Pulse 92  Temp(Src) 98.7 F (37.1 C)  Resp 18  Ht 5\' 4"  (1.626 m)  Wt 141 lb (63.957 kg)  BMI 24.19 kg/m2  SpO2 98%  Physical Exam  Eyes: EOM are normal. Pupils are equal, round, and reactive to light. Right eye exhibits discharge. Right eye exhibits no chemosis and no exudate. Left eye exhibits discharge. Left eye exhibits no chemosis and no exudate. Right conjunctiva is injected. Right conjunctiva has no hemorrhage. Left conjunctiva is injected. Left conjunctiva has no hemorrhage. No scleral icterus.    No results found for this or any previous visit (from the past 72 hour(s)).  No results found.   Visual Acuity Screening   Right eye Left eye Both eyes  Without correction: 20/50 20/70 20/40  -1  With correction:      Lab Results  Component Value Date   HGBA1C 7.1* 04/27/2013     ASSESSMENT AND PLAN  Tabitha Ramirez was seen today for eye problem.  Diagnoses and all orders for this visit:  Infective conjunctivitis: She does not wear contacts, but has glasses and is not wearing those today.  Her exam is reassuring and the fact that this started in one eye and has moved to the other eye is also reassuring.  Will cover for a bacterial etiology.  Patient instructed to call or RTC in 48 hours if she is having any problems so that I may get her to Dr. Zenia Resides office.   -     trimethoprim-polymyxin b (POLYTRIM) ophthalmic solution; Place 2 drops into both eyes every 6 (six) hours. -     Care order/instruction  The patient was advised to call or return to clinic if she does not see an improvement in symptoms or to seek the care of the closest emergency department if she worsens with the above plan.   Philis Fendt, MHS, PA-C [facility] 12/01/2015 1:29 PM

## 2015-12-03 ENCOUNTER — Telehealth: Payer: Self-pay

## 2015-12-03 ENCOUNTER — Other Ambulatory Visit: Payer: Self-pay

## 2015-12-03 DIAGNOSIS — C50119 Malignant neoplasm of central portion of unspecified female breast: Secondary | ICD-10-CM

## 2015-12-03 MED ORDER — OXYCODONE-ACETAMINOPHEN 5-325 MG PO TABS: 1.0000 | ORAL_TABLET | ORAL | Status: DC | PRN

## 2015-12-03 NOTE — Telephone Encounter (Signed)
Let Smith-Hylton know that Rx ready for pickup - cannot be filled until 3/13 - and spoke with [facility] - they should be calling her.   She voiced understanding.

## 2015-12-03 NOTE — Telephone Encounter (Signed)
Patient calling for percocet refill.  VM sent to desk RN.

## 2015-12-03 NOTE — Telephone Encounter (Signed)
Daughter has called yesterday and at 0830 requesting a refill on percoct.  It is due for refill on Sunday.  Daughter lives in [place].  Can she come today to pick up prescription or does she need to wait until Monday 12-06-15.  Please call daughter asap as she needs to come from out of town.

## 2015-12-21 ENCOUNTER — Other Ambulatory Visit: Payer: Self-pay | Admitting: Hematology and Oncology

## 2015-12-21 NOTE — Telephone Encounter (Signed)
Chart reviewed.

## 2015-12-22 ENCOUNTER — Other Ambulatory Visit: Payer: Self-pay

## 2015-12-22 DIAGNOSIS — C50119 Malignant neoplasm of central portion of unspecified female breast: Secondary | ICD-10-CM

## 2015-12-22 MED ORDER — OXYCODONE-ACETAMINOPHEN 5-325 MG PO TABS: 1.0000 | ORAL_TABLET | ORAL | Status: DC | PRN

## 2016-01-12 ENCOUNTER — Telehealth: Payer: Self-pay | Admitting: *Deleted

## 2016-01-12 ENCOUNTER — Other Ambulatory Visit: Payer: Self-pay

## 2016-01-12 DIAGNOSIS — C50119 Malignant neoplasm of central portion of unspecified female breast: Secondary | ICD-10-CM

## 2016-01-12 MED ORDER — OXYCODONE-ACETAMINOPHEN 5-325 MG PO TABS: 1.0000 | ORAL_TABLET | ORAL | Status: DC | PRN

## 2016-01-12 NOTE — Telephone Encounter (Signed)
[name] [name] called requesting refill for Mom's Percocet 5-325 mg and will pick up tomorrow.  Return number if any questions before ready for pick up is [phone].

## 2016-01-24 ENCOUNTER — Other Ambulatory Visit: Payer: Self-pay | Admitting: Endocrinology

## 2016-01-24 DIAGNOSIS — E049 Nontoxic goiter, unspecified: Secondary | ICD-10-CM

## 2016-01-31 ENCOUNTER — Telehealth: Payer: Self-pay | Admitting: *Deleted

## 2016-01-31 ENCOUNTER — Other Ambulatory Visit: Payer: Self-pay

## 2016-01-31 DIAGNOSIS — C50119 Malignant neoplasm of central portion of unspecified female breast: Secondary | ICD-10-CM

## 2016-01-31 MED ORDER — OXYCODONE-ACETAMINOPHEN 5-325 MG PO TABS: 1.0000 | ORAL_TABLET | ORAL | Status: DC | PRN

## 2016-01-31 NOTE — Telephone Encounter (Signed)
"  I need to know if I can pick up my mom's refills.  I called on Friday."  Return number [phone].

## 2016-02-14 ENCOUNTER — Ambulatory Visit (INDEPENDENT_AMBULATORY_CARE_PROVIDER_SITE_OTHER): Payer: Medicare Other | Admitting: Family Medicine

## 2016-02-14 ENCOUNTER — Ambulatory Visit (INDEPENDENT_AMBULATORY_CARE_PROVIDER_SITE_OTHER): Payer: Medicare Other

## 2016-02-14 VITALS — BP 122/80 | HR 84 | Temp 98.4°F | Resp 18 | Ht 64.0 in | Wt 143.0 lb

## 2016-02-14 DIAGNOSIS — R430 Anosmia: Secondary | ICD-10-CM | POA: Diagnosis not present

## 2016-02-14 DIAGNOSIS — R05 Cough: Secondary | ICD-10-CM | POA: Diagnosis not present

## 2016-02-14 DIAGNOSIS — R059 Cough, unspecified: Secondary | ICD-10-CM

## 2016-02-14 MED ORDER — FLUTICASONE PROPIONATE 50 MCG/ACT NA SUSP: 2.0000 | Freq: Every day | NASAL | Status: DC

## 2016-02-14 NOTE — Progress Notes (Signed)
[facility] [address] [place], [place] [address] [phone]  Date:  02/14/2016   Name:  Tabitha Ramirez   DOB:  1945/11/23   MRN:  [id number]  PCP:  No primary care provider on file.    Chief Complaint: URI   History of Present Illness:  Tabitha Ramirez is a 70 y.o. very pleasant female patient who presents with the following: 4 weeks of cough - Taking Robitussin DM - No nasal sprays or netti pot.   - Sense of smell/taste minimal with congestion.   - Congestion improving, now slight - Feels sinus congestion - Cough is "annoying". Not keeping up at night.   - appetite normal  Breast cancer in remission.  Was on chemo meds.   Patient Active Problem List   Diagnosis Date Noted  . Neuropathy ([place]) 06/11/2014  . 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 Gait abnormality 01/26/2014  . Unspecified hereditary and idiopathic peripheral neuropathy 01/26/2014  . 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[place]) 01/20/2014  . Neuropathic pain 01/20/2014  . Anxiety 01/20/2014  . Unspecified deficiency anemia 01/20/2014  . Hx of neutropenia 08/04/2013  . Jaw pain 08/04/2013  . Leukocytosis 04/27/2013  . Pericardial effusion 04/27/2013  . Cancer of central portion of right female breast ([place]) 02/04/2013  . Need for influenza vaccination 11/29/2011  . Hypertriglyceridemia 03/07/2011  . Post-menopausal 03/07/2011  . Anemia 12/17/2010  . Heart murmur 12/17/2010  . Seasonal allergies 12/17/2010  . HYPOKALEMIA 02/19/2009  . Essential hypertension, benign 04/09/2008    Past Medical History  Diagnosis Date  . Heart murmur   . Hypertension   . Anemia   . Allergy   . Migraines   . History of blood transfusion     "related to chemo/breast cancer" (09/29/2013)  . GERD (gastroesophageal reflux disease)   . Breast cancer ([place])   . Arthritis     "joints" (09/29/2013)  . TMJ (temporomandibular joint  syndrome)     "left; just dx'd" (09/29/2013  . Radiation 11/05/13-12/24/13    Right breast     Past Surgical History  Procedure Laterality Date  . Portacath placement N/A 03/10/2013    Procedure: INSERTION PORT-A-CATH WITH FLUOROSCOPY AND ULTRASOUND;  Surgeon: Adin Hector, MD;  Location: [facility];  Service: General;  Laterality: N/A;  . Axillary lymph node biopsy Right 03/10/2013    Procedure: AXILLARY LYMPH NODE BIOPSY;  Surgeon: Adin Hector, MD;  Location: [facility];  Service: General;  Laterality: Right;  sentinel node with blue dye  . Esophagogastroduodenoscopy N/A 04/28/2013    Procedure: ESOPHAGOGASTRODUODENOSCOPY (EGD);  Surgeon: Wonda Horner, MD;  Location: [facility];  Service: Endoscopy;  Laterality: N/A;  . Colonoscopy N/A 04/30/2013    Procedure: COLONOSCOPY;  Surgeon: Wonda Horner, MD;  Location: [facility];  Service: Endoscopy;  Laterality: N/A;  . Port-a-cath removal  09/29/2013  . Breast lumpectomy Right 09/29/2013    needle localization w/axillary LND/notes 09/29/2013  . Total abdominal hysterectomy      With bilateral salpingo-oophorectomy  . Tonsillectomy    . Mastectomy Right 09/29/2013    "partial"  . Breast lumpectomy with needle localization and axillary lymph node dissection Right 09/29/2013    Procedure: RIGHT BREAST NEEDLE LOCALIZED LUMPECTOMY AND AXILLARY LYMPH NODE DISSECTION;  Surgeon: Adin Hector, MD;  Location: [facility];  Service: General;  Laterality: Right;  . Port-a-cath removal Left 09/29/2013    Procedure: REMOVAL PORT-A-CATH;  Surgeon: Adin Hector, MD;  Location: [facility];  Service: General;  Laterality: Left;  . Sural nerve bx Right 05/13/2014    Procedure: SURAL NERVE BIOPSY;  Surgeon: Hosie Spangle, MD;  Location: [facility];  Service: Neurosurgery;  Laterality: Right;  Right sural nerve biopsy    Social History  Substance Use Topics  . Smoking status: Former Smoker -- 1 years    Types: Cigarettes  . Smokeless tobacco: Never Used     Comment:  smoked in college 1 yr   . Alcohol Use: No     Comment: Wine occasionally    Family History  Problem Relation Age of Onset  . Cancer Mother 48    Unknown type of cancer  . Congestive Heart Failure Mother   . Colon cancer Brother   . Diabetes Brother 16  . Heart disease Brother     AMI 05/22/2012  . Congestive Heart Failure Brother   . Cancer Maternal Grandmother 72    Unknown type of cancer    Allergies  Allergen Reactions  . Penicillins Rash    Medication list has been reviewed and updated.  Current Outpatient Prescriptions on File Prior to Visit  Medication Sig Dispense Refill  . acetaminophen (TYLENOL) 500 MG tablet Take 1,000 mg by mouth every 6 (six) hours as needed for mild pain.    Marland Kitchen gabapentin (NEURONTIN) 300 MG capsule Take 1 capsule by mouth twice daily.  Must make appt for further refills. 60 capsule 0  . levothyroxine (SYNTHROID, LEVOTHROID) 75 MCG tablet Take 75 mcg by mouth daily before breakfast.    . LORazepam (ATIVAN) 0.5 MG tablet TAKE 1 TABLET AT BEDTIME AS NEEDED FOR SLEEP 30 tablet 2  . ondansetron (ZOFRAN) 8 MG tablet TAKE 1 TABLET (8 MG TOTAL) BY MOUTH EVERY 12 (TWELVE) HOURS AS NEEDED FOR NAUSEA. 20 tablet 0  . OVER THE COUNTER MEDICATION Take 15 mLs by mouth daily. Reported on 12/01/2015    . oxyCODONE-acetaminophen (PERCOCET/ROXICET) 5-325 MG tablet Take 1 tablet by mouth every 4 (four) hours as needed for severe pain. 120 tablet 0  . oxyCODONE-acetaminophen (PERCOCET/ROXICET) 5-325 MG tablet Take 1 tablet by mouth every 4 (four) hours as needed for severe pain. 120 tablet 0  . triamterene-hydrochlorothiazide (MAXZIDE-25) 37.5-25 MG per tablet Take 0.5 tablets by mouth every other day. 15 tablet 0  . trimethoprim-polymyxin b (POLYTRIM) ophthalmic solution Place 2 drops into both eyes every 6 (six) hours. (Patient not taking: Reported on 02/14/2016) 10 mL 0   No current facility-administered medications on file prior to visit.    Review of  Systems:  Review of Systems  Constitutional: Positive for malaise/fatigue (more than normal). Negative for fever, chills and weight loss.  HENT: Positive for congestion. Negative for ear discharge, ear pain and sore throat.   Respiratory: Positive for cough. Negative for hemoptysis, sputum production and shortness of breath.   Cardiovascular: Negative for chest pain and palpitations.  Gastrointestinal: Negative for nausea, vomiting, abdominal pain, diarrhea and constipation.  Neurological: Negative for dizziness and headaches.  Psychiatric/Behavioral: Negative for depression.    Physical Examination: Filed Vitals:   02/14/16 1107  BP: 122/80  Pulse: 84  Temp: 98.4 F (36.9 C)  Resp: 18   Filed Vitals:   02/14/16 1107  Height: 5\' 4"  (1.626 m)  Weight: 143 lb (64.864 kg)   Body mass index is 24.53 kg/(m^2). Ideal Body Weight: Weight in (lb) to have BMI = 25: 145.3  GEN: WDWN, NAD, Non-toxic,  A & O x 3 HEENT: Atraumatic, Normocephalic. Neck supple. No masses, No LAD. Ears and Nose: No external deformity. CV: RRR, No M/G/R. No JVD. No thrill. No extra heart sounds. PULM: CTA B, no wheezes, crackles, rhonchi. No retractions. No resp. distress. No accessory muscle use. Occasional dry cough.   ABD: S, NT, ND, +BS. No rebound. No HSM. EXTR: No c/c/e NEURO Normal gait.  PSYCH: Normally interactive. Conversant. Not depressed or anxious appearing.  Calm demeanor.   Assessment and Plan: Cough/Congestion: CXR wnl.  Hx of breast cancer. No red flags.  Unremarkable exam. Improving congestion, Flonase & nasal saline x 2 weeks.    Anosmia, [place]: as above. Likely URI/allergies.  Flonase x 2 weeks, then f/u.  Possible chemotherapy med.  Consider referral to ENT if persistent.   Signed Gerre Pebbles, MD

## 2016-02-14 NOTE — Patient Instructions (Addendum)
IF you received an x-ray today, you will receive an invoice from [facility]. Please contact [facility] at [phone] with questions or concerns regarding your invoice.   IF you received labwork today, you will receive an invoice from [employer]. Please contact [facility] at [phone] with questions or concerns regarding your invoice.   Our billing staff will not be able to assist you with questions regarding bills from these companies.  You will be contacted with the lab results as soon as they are available. The fastest way to get your results is to activate your My Chart account. Instructions are located on the last page of this paperwork. If you have not heard from [place] regarding the results in 2 weeks, please contact this office.   Pick up flonase or nasacort.  Take one of these as well as nasal saline.  Follow up in 2 weeks to recheck your symptoms.    Fluticasone nasal spray What is this medicine? FLUTICASONE (floo TIK a sone) is a corticosteroid. This medicine is used to treat the symptoms of allergies like sneezing, itchy red eyes, and itchy, runny, or stuffy nose. This medicine may be used for other purposes; ask your health care provider or pharmacist if you have questions. What should I tell my health care provider before I take this medicine? They need to know if you have any of these conditions: -infection, like tuberculosis, herpes, or fungal infection -recent surgery on nose or sinuses -taking corticosteroid by mouth -an unusual or allergic reaction to fluticasone, steroids, other medicines, foods, dyes, or preservatives -pregnant or trying to get pregnant -breast-feeding How should I use this medicine? This medicine is for use in the nose. Follow the directions on your product or prescription label. This medicine works best if used at regular intervals. Do not use more often than directed. Make sure that you are using  your nasal spray correctly. After 6 months of daily use without a prescription, talk to your doctor or health care professional before using it for a longer time. Ask your doctor or health care professional if you have any questions. Talk to your pediatrician regarding the use of this medicine in children. Special care may be needed. This medicine has been used in children as young as 2 years. After two months of daily use without a prescription in a child, talk to your pediatrician before using it for a longer time. Overdosage: If you think you have taken too much of this medicine contact a poison control center or emergency room at once. NOTE: This medicine is only for you. Do not share this medicine with others. What if I miss a dose? If you miss a dose, use it as soon as you remember. If it is almost time for your next dose, use only that dose and continue with your regular schedule. Do not use double or extra doses. What may interact with this medicine? -ketoconazole -metyrapone -some medicines for HIV -vaccines This list may not describe all possible interactions. Give your health care provider a list of all the medicines, herbs, non-prescription drugs, or dietary supplements you use. Also tell them if you smoke, drink alcohol, or use illegal drugs. Some items may interact with your medicine. What should I watch for while using this medicine? Visit your doctor or health care professional for regular checks on your progress. Some symptoms may improve within 12 hours after starting use. Check with your doctor or health care professional if there  is no improvement in your condition after 3 weeks of use. Do not come in contact with people who have chickenpox or the measles while you are taking this medicine. If you do, call your doctor right away. What side effects may I notice from receiving this medicine? Side effects that you should report to your doctor or health care professional as soon as  possible: -allergic reactions like skin rash, itching or hives, swelling of the face, lips, or tongue -changes in vision -flu-like symptoms -white patches or sores in the mouth or nose Side effects that usually do not require medical attention (report to your doctor or health care professional if they continue or are bothersome): -burning or irritation inside the nose or throat -cough -headache -nosebleed -unusual taste or smell This list may not describe all possible side effects. Call your doctor for medical advice about side effects. You may report side effects to FDA at 1-800-FDA-1088. Where should I keep my medicine? Keep out of the reach of children. Store at room temperature between 15 and 30 degrees C (59 and 86 degrees F). Throw away any unused medicine after the expiration date. NOTE: This sheet is a summary. It may not cover all possible information. If you have questions about this medicine, talk to your doctor, pharmacist, or health care provider.    2016, Elsevier/Gold Standard. (2014-02-04 09:07:53) Cough, Adult Coughing is a reflex that clears your throat and your airways. Coughing helps to heal and protect your lungs. It is normal to cough occasionally, but a cough that happens with other symptoms or lasts a long time may be a sign of a condition that needs treatment. A cough may last only 2-3 weeks (acute), or it may last longer than 8 weeks (chronic). CAUSES Coughing is commonly caused by:  Breathing in substances that irritate your lungs.  A viral or bacterial respiratory infection.  Allergies.  Asthma.  Postnasal drip.  Smoking.  Acid backing up from the stomach into the esophagus (gastroesophageal reflux).  Certain medicines.  Chronic lung problems, including COPD (or rarely, lung cancer).  Other medical conditions such as heart failure. HOME CARE INSTRUCTIONS  Pay attention to any changes in your symptoms. Take these actions to help with your  discomfort:  Take medicines only as told by your health care provider.  If you were prescribed an antibiotic medicine, take it as told by your health care provider. Do not stop taking the antibiotic even if you start to feel better.  Talk with your health care provider before you take a cough suppressant medicine.  Drink enough fluid to keep your urine clear or pale yellow.  If the air is dry, use a cold steam vaporizer or humidifier in your bedroom or your home to help loosen secretions.  Avoid anything that causes you to cough at work or at home.  If your cough is worse at night, try sleeping in a semi-upright position.  Avoid cigarette smoke. If you smoke, quit smoking. If you need help quitting, ask your health care provider.  Avoid caffeine.  Avoid alcohol.  Rest as needed. SEEK MEDICAL CARE IF:   You have new symptoms.  You cough up pus.  Your cough does not get better after 2-3 weeks, or your cough gets worse.  You cannot control your cough with suppressant medicines and you are losing sleep.  You develop pain that is getting worse or pain that is not controlled with pain medicines.  You have a fever.  You have  unexplained weight loss.  You have night sweats. SEEK IMMEDIATE MEDICAL CARE IF:  You cough up blood.  You have difficulty breathing.  Your heartbeat is very fast.   This information is not intended to replace advice given to you by your health care provider. Make sure you discuss any questions you have with your health care provider.   Document Released: 03/10/2011 Document Revised: 06/02/2015 Document Reviewed: 11/18/2014 Elsevier Interactive Patient Education 2016 [place] Fever Hay fever is an allergic reaction to particles in the air. It cannot be passed from person to person. It cannot be cured, but it can be controlled. CAUSES  Hay fever is caused by something that triggers an allergic reaction (allergens). The following are  examples of allergens:  Ragweed.  Feathers.  Animal dander.  Grass and tree pollens.  Cigarette smoke.  House dust.  Pollution. SYMPTOMS   Sneezing.  Runny or stuffy nose.  Tearing eyes.  Itchy eyes, nose, mouth, throat, skin, or other area.  Sore throat.  Headache.  Decreased sense of smell or taste. DIAGNOSIS Your caregiver will perform a physical exam and ask questions about the symptoms you are having.Allergy testing may be done to determine exactly what triggers your hay fever.  TREATMENT   Over-the-counter medicines may help symptoms. These include:  Antihistamines.  Decongestants. These may help with nasal congestion.  Your caregiver may prescribe medicines if over-the-counter medicines do not work.  Some people benefit from allergy shots when other medicines are not helpful. HOME CARE INSTRUCTIONS   Avoid the allergen that is causing your symptoms, if possible.  Take all medicine as told by your caregiver. SEEK MEDICAL CARE IF:   You have severe allergy symptoms and your current medicines are not helping.  Your treatment was working at one time, but you are now experiencing symptoms.  You have sinus congestion and pressure.  You develop a fever or headache.  You have thick nasal discharge.  You have asthma and have a worsening cough and wheezing. SEEK IMMEDIATE MEDICAL CARE IF:   You have swelling of your tongue or lips.  You have trouble breathing.  You feel lightheaded or like you are going to faint.  You have cold sweats.  You have a fever.   This information is not intended to replace advice given to you by your health care provider. Make sure you discuss any questions you have with your health care provider.   Document Released: 09/11/2005 Document Revised: 12/04/2011 Document Reviewed: 03/24/2015 Elsevier Interactive Patient Education [employer].

## 2016-02-16 ENCOUNTER — Other Ambulatory Visit: Payer: Medicare Other

## 2016-02-17 ENCOUNTER — Telehealth: Payer: Self-pay | Admitting: *Deleted

## 2016-02-17 ENCOUNTER — Other Ambulatory Visit: Payer: Self-pay | Admitting: *Deleted

## 2016-02-17 DIAGNOSIS — C50119 Malignant neoplasm of central portion of unspecified female breast: Secondary | ICD-10-CM

## 2016-02-17 MED ORDER — OXYCODONE-ACETAMINOPHEN 5-325 MG PO TABS: 1.0000 | ORAL_TABLET | ORAL | Status: DC | PRN

## 2016-02-17 NOTE — Telephone Encounter (Signed)
[name] [name] called requesting refill of Percocet. Unable to leave message with [name] as voice mail was full. Left message on patient's phone that prescription was ready for pick up.

## 2016-03-03 ENCOUNTER — Ambulatory Visit
Admission: RE | Admit: 2016-03-03 | Discharge: 2016-03-03 | Disposition: A | Discharge status: Home / Self Care | Payer: Medicare Other | Source: Ambulatory Visit | Attending: Endocrinology | Admitting: Endocrinology

## 2016-03-03 DIAGNOSIS — E049 Nontoxic goiter, unspecified: Secondary | ICD-10-CM

## 2016-03-06 ENCOUNTER — Other Ambulatory Visit: Payer: Self-pay

## 2016-03-06 DIAGNOSIS — C50119 Malignant neoplasm of central portion of unspecified female breast: Secondary | ICD-10-CM

## 2016-03-06 MED ORDER — OXYCODONE-ACETAMINOPHEN 5-325 MG PO TABS: 1.0000 | ORAL_TABLET | ORAL | Status: DC | PRN

## 2016-03-06 NOTE — Telephone Encounter (Signed)
Received VM from pt's daughter requesting refill for her mother's Norco.  Prescription refilled and pt's daughter informed it was ready for pick up at her convenience.

## 2016-03-20 ENCOUNTER — Other Ambulatory Visit: Payer: Self-pay | Admitting: Hematology and Oncology

## 2016-03-20 DIAGNOSIS — C50111 Malignant neoplasm of central portion of right female breast: Secondary | ICD-10-CM

## 2016-03-21 ENCOUNTER — Other Ambulatory Visit: Payer: Self-pay | Admitting: *Deleted

## 2016-03-21 DIAGNOSIS — C50119 Malignant neoplasm of central portion of unspecified female breast: Secondary | ICD-10-CM

## 2016-03-21 MED ORDER — OXYCODONE-ACETAMINOPHEN 5-325 MG PO TABS: 1.0000 | ORAL_TABLET | ORAL | Status: DC | PRN

## 2016-04-06 ENCOUNTER — Telehealth: Payer: Self-pay | Admitting: *Deleted

## 2016-04-06 ENCOUNTER — Telehealth: Payer: Self-pay | Admitting: Hematology and Oncology

## 2016-04-06 NOTE — Telephone Encounter (Signed)
pt cld to r/s appt*gve pt time & date of r/s

## 2016-04-06 NOTE — Telephone Encounter (Signed)
"  I need to know when my next appointment is?"  Advised 04-10-2016 at 10:30 with Dr. Lindi Adie.

## 2016-04-10 ENCOUNTER — Ambulatory Visit: Payer: Medicare Other | Admitting: Hematology and Oncology

## 2016-04-10 ENCOUNTER — Other Ambulatory Visit: Payer: Self-pay

## 2016-04-10 DIAGNOSIS — C50119 Malignant neoplasm of central portion of unspecified female breast: Secondary | ICD-10-CM

## 2016-04-10 MED ORDER — OXYCODONE-ACETAMINOPHEN 5-325 MG PO TABS: 1.0000 | ORAL_TABLET | ORAL | Status: DC | PRN

## 2016-04-24 ENCOUNTER — Telehealth: Payer: Self-pay | Admitting: Hematology and Oncology

## 2016-04-24 ENCOUNTER — Ambulatory Visit (HOSPITAL_BASED_OUTPATIENT_CLINIC_OR_DEPARTMENT_OTHER): Payer: Medicare Other | Admitting: Hematology and Oncology

## 2016-04-24 ENCOUNTER — Encounter: Payer: Self-pay | Admitting: Hematology and Oncology

## 2016-04-24 DIAGNOSIS — C50111 Malignant neoplasm of central portion of right female breast: Secondary | ICD-10-CM | POA: Diagnosis not present

## 2016-04-24 DIAGNOSIS — M8580 Other specified disorders of bone density and structure, unspecified site: Secondary | ICD-10-CM | POA: Diagnosis not present

## 2016-04-24 DIAGNOSIS — Z171 Estrogen receptor negative status [ER-]: Secondary | ICD-10-CM

## 2016-04-24 DIAGNOSIS — C50119 Malignant neoplasm of central portion of unspecified female breast: Secondary | ICD-10-CM

## 2016-04-24 DIAGNOSIS — G620 Drug-induced polyneuropathy: Secondary | ICD-10-CM

## 2016-04-24 MED ORDER — OXYCODONE-ACETAMINOPHEN 5-325 MG PO TABS: 1.0000 | ORAL_TABLET | ORAL | 0 refills | Status: DC | PRN

## 2016-04-24 MED ORDER — GABAPENTIN 300 MG PO CAPS: ORAL_CAPSULE | ORAL | 3 refills | Status: DC

## 2016-04-24 NOTE — Assessment & Plan Note (Signed)
1. 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 N0 M0 ER PR 0% HER-2 negative status post radiation therapy  2. Severe peripheral neuropathy: Currently on gabapentin, sees neurology sural nerve biopsy was performed by them. No major improvement in her symptoms.currently takes pain medications  3. Breast Cancer Surveillance: 1. Breast exam 04/24/16: Normal 2. Mammogram needs to be done this year 3. Bone density 03/26/2015: T score -1.9 osteopenia: Currently on calcium and vitamin D 4. Radiation pneumonitis: chest CT scan 03/30/2015 showed right upper lung scarring. Patient does not have any cough or shortness of breath.  Return to clinic in 1 year for follow-up

## 2016-04-24 NOTE — Telephone Encounter (Signed)
appt made and avs printed °

## 2016-04-24 NOTE — Progress Notes (Signed)
Patient Care Team: Chauncey Cruel, MD as Consulting Physician (Oncology)  DIAGNOSIS: Cancer of central portion of right female breast [facility])   Staging form: Breast, AJCC 7th Edition   - Clinical: Stage IIB (T2, N1, cM0) - Signed by Thea Silversmith, MD on 10/24/2013         Staging comments: Staged at breast conference 5.21.14    - Pathologic: No stage assigned - Unsigned  SUMMARY OF ONCOLOGIC HISTORY:   Cancer of central portion of right female breast ([place])   01/31/2013 Mammogram    Area of distortion upper-outer quadrant right breast diffuse calcifications in both breasts 2.5 cm with prominent right axillary lymph node 2.5 cm     01/31/2013 Initial Diagnosis    Cancer of central portion of female breast: 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 Ki-67 84%; sentinel lymph node sampling 03/10/2013 showed1/2 SLN pos PR 3% PR 6% Ki-67 15% HER-2 negative     02/10/2013 Breast MRI    Right breast: Rim enhancing necrotic mass 3.4 cm with a 2 cm level I axillary lymph node     03/17/2013 - 08/25/2013 Neo-Adjuvant Chemotherapy    Dose dense Adriamycin and Cytoxan Followed by weekly Taxol and carboplatin x12; chemotherapy-induced anemia and peripheral neuropathy were the complications     12/29/6254 Surgery    Right breast lumpectomy scatter residual invasive mass 1.1 cm with [facility] 0/11 lymph nodes no cancer seen: ER 0% PR 0% insufficient to mark it on HER-2 testing     11/12/2013 - 12/24/2013 Radiation Therapy    Radiation therapy to the breast and axilla      CHIEF COMPLIANT: Follow-up of breast cancer  INTERVAL HISTORY: Tabitha Ramirez is a 71 year old with above-mentioned history of right breast cancer treated with neoadjuvant chemotherapy followed by lumpectomy and radiation. She is here for annual follow-up. She reports profound neuropathy in hands and feet which is slightly improved over time but not any significant amount. She still requests pain medications for the pain.  REVIEW OF  SYSTEMS:   Constitutional: Denies fevers, chills or abnormal weight loss Eyes: Denies blurriness of vision Ears, nose, mouth, throat, and face: Denies mucositis or sore throat Respiratory: Denies cough, dyspnea or wheezes Cardiovascular: Denies palpitation, chest discomfort Gastrointestinal:  Denies nausea, heartburn or change in bowel habits Skin: Denies abnormal skin rashes Lymphatics: Denies new lymphadenopathy or easy bruising Neurological: Neuropathy in hands and feet with pain Behavioral/Psych: Mood is stable, no new changes  Extremities: No lower extremity edema All other systems were reviewed with the patient and are negative.  I have reviewed the past medical history, past surgical history, social history and family history with the patient and they are unchanged from previous note.  ALLERGIES:  is allergic to penicillins.  MEDICATIONS:  Current Outpatient Prescriptions  Medication Sig Dispense Refill  . acetaminophen (TYLENOL) 500 MG tablet Take 1,000 mg by mouth every 6 (six) hours as needed for mild pain.    . fluticasone (FLONASE) 50 MCG/ACT nasal spray Place 2 sprays into both nostrils daily. 16 g 12  . gabapentin (NEURONTIN) 300 MG capsule Take 1 capsule by mouth twice daily.  Must make appt for further refills. 60 capsule 0  . levothyroxine (SYNTHROID, LEVOTHROID) 75 MCG tablet Take 75 mcg by mouth daily before breakfast.    . LORazepam (ATIVAN) 0.5 MG tablet TAKE 1 TABLET BY MOUTH AT BEDTIME AS NEEDED FOR SLEEP 30 tablet 2  . ondansetron (ZOFRAN) 8 MG tablet TAKE 1 TABLET (8 MG  TOTAL) BY MOUTH EVERY 12 (TWELVE) HOURS AS NEEDED FOR NAUSEA. 20 tablet 0  . OVER THE COUNTER MEDICATION Take 15 mLs by mouth daily. Reported on 12/01/2015    . oxyCODONE-acetaminophen (PERCOCET/ROXICET) 5-325 MG tablet Take 1 tablet by mouth every 4 (four) hours as needed for severe pain. 120 tablet 0  . triamterene-hydrochlorothiazide (MAXZIDE-25) 37.5-25 MG per tablet Take 0.5 tablets by mouth  every other day. 15 tablet 0  . trimethoprim-polymyxin b (POLYTRIM) ophthalmic solution Place 2 drops into both eyes every 6 (six) hours. (Patient not taking: Reported on 02/14/2016) 10 mL 0   No current facility-administered medications for this visit.     PHYSICAL EXAMINATION: ECOG PERFORMANCE STATUS: 1 - Symptomatic but completely ambulatory  Vitals:   04/24/16 1143  BP: 130/68  Pulse: 85  Resp: 18  Temp: 98.3 F (36.8 C)   Filed Weights   04/24/16 1143  Weight: 145 lb 3.2 oz (65.9 kg)    GENERAL:alert, no distress and comfortable SKIN: skin color, texture, turgor are normal, no rashes or significant lesions EYES: normal, Conjunctiva are pink and non-injected, sclera clear OROPHARYNX:no exudate, no erythema and lips, buccal mucosa, and tongue normal  NECK: supple, thyroid normal size, non-tender, without nodularity LYMPH:  no palpable lymphadenopathy in the cervical, axillary or inguinal LUNGS: clear to auscultation and percussion with normal breathing effort HEART: regular rate & rhythm and no murmurs and no lower extremity edema ABDOMEN:abdomen soft, non-tender and normal bowel sounds MUSCULOSKELETAL:no cyanosis of digits and no clubbing  NEURO: alert & oriented x 3 with fluent speech, no focal motor/sensory deficits EXTREMITIES: No lower extremity edema  LABORATORY DATA:  I have reviewed the data as listed   Chemistry      Component Value Date/Time   NA 140 03/30/2015 0944   K 3.8 03/30/2015 0944   CL 100 05/12/2014 1238   CL 100 03/13/2013 0918   CO2 26 03/30/2015 0944   BUN 18.9 03/30/2015 0944   CREATININE 0.9 03/30/2015 0944      Component Value Date/Time   CALCIUM 9.7 03/30/2015 0944   ALKPHOS 100 03/30/2015 0944   AST 20 03/30/2015 0944   ALT 19 03/30/2015 0944   BILITOT 0.45 03/30/2015 0944       Lab Results  Component Value Date   WBC 6.1 06/22/2014   HGB 10.7 (L) 06/22/2014   HCT 32.5 (L) 06/22/2014   MCV 102.2 (H) 06/22/2014   PLT 284  06/22/2014   NEUTROABS 2.8 06/22/2014     ASSESSMENT & PLAN:  Cancer of central portion of right female breast ([place]) 1. 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 N0 M0 ER PR 0% HER-2 negative status post radiation therapy  2. Severe peripheral neuropathy: Currently on gabapentin, sees neurology sural nerve biopsy was performed by them. Slight improvement in her symptoms.currently takes pain medications. She wants to cut down the gabapentin to once a day at bedtime. I renewed her pain prescription today. I discussed the role of acupuncture as well asTENS unit along with applying menthol to her hands and feet.  3. Breast Cancer Surveillance: 1. Breast exam 04/24/16: Normal 2. Mammogram needs to be done this year 3. Bone density 03/26/2015: T score -1.9 osteopenia: Currently on calcium and vitamin D 4. Radiation pneumonitis: chest CT scan 03/30/2015 showed right upper lung scarring. Patient does not have any cough or shortness of  breath.  Return to clinic in 1 year for follow-up    No orders of the defined types were placed in this encounter.  The patient has a good understanding of the overall plan. she agrees with it. she will call with any problems that may develop before the next visit here.   Rulon Eisenmenger, MD 04/24/16

## 2016-05-15 ENCOUNTER — Telehealth: Payer: Self-pay | Admitting: *Deleted

## 2016-05-15 ENCOUNTER — Other Ambulatory Visit: Payer: Self-pay

## 2016-05-15 DIAGNOSIS — C50119 Malignant neoplasm of central portion of unspecified female breast: Secondary | ICD-10-CM

## 2016-05-15 MED ORDER — OXYCODONE-ACETAMINOPHEN 5-325 MG PO TABS: 1.0000 | ORAL_TABLET | ORAL | 0 refills | Status: DC | PRN

## 2016-05-15 NOTE — Telephone Encounter (Signed)
Attempted to reach [name] [place] at [phone] three times to let her know that mother's rx for percocet is available for pick up.   No answer x three.   Did not leave a message as voicemail did not identify herself.   Also was going to discuss that we have no ROI on file for her.  She will need to complete one in order for [place] to communicate re: mother.

## 2016-05-15 NOTE — Telephone Encounter (Signed)
Received VM from pt's daughter stating her mother needed a refill on her Percocet.  Chart reviewed and refill deemed appropriate.  Medication refilled and prescription available for pick up at pt convenience.

## 2016-05-15 NOTE — Telephone Encounter (Signed)
"  Did you all receive my nessage about mom's Percocet refill and can Ipick this up tomorrow morning?"  YES

## 2016-05-18 ENCOUNTER — Other Ambulatory Visit: Payer: Self-pay | Admitting: Hematology and Oncology

## 2016-05-18 DIAGNOSIS — Z853 Personal history of malignant neoplasm of breast: Secondary | ICD-10-CM

## 2016-05-21 ENCOUNTER — Other Ambulatory Visit: Payer: Self-pay | Admitting: Hematology and Oncology

## 2016-05-21 DIAGNOSIS — C50111 Malignant neoplasm of central portion of right female breast: Secondary | ICD-10-CM

## 2016-05-22 NOTE — Telephone Encounter (Signed)
Chart reviewed.

## 2016-05-30 ENCOUNTER — Other Ambulatory Visit: Payer: Self-pay

## 2016-05-30 DIAGNOSIS — C50119 Malignant neoplasm of central portion of unspecified female breast: Secondary | ICD-10-CM

## 2016-05-30 MED ORDER — OXYCODONE-ACETAMINOPHEN 5-325 MG PO TABS: 1.0000 | ORAL_TABLET | ORAL | 0 refills | Status: DC | PRN

## 2016-05-30 NOTE — Telephone Encounter (Signed)
Received VM from pt's daughter who is requesting refill for her mother's Percocet to be picked up while she is working in the area.  Called to discuss with daughter as it is too early for refill at this time.  Explained to pt's daughter I can refill and put date it can be refilled on script.  Pt in agreement with this and prescription refilled.  Pt to pick up prescription at her convenience.

## 2016-06-16 ENCOUNTER — Other Ambulatory Visit: Payer: Self-pay | Admitting: Hematology and Oncology

## 2016-06-16 DIAGNOSIS — C50111 Malignant neoplasm of central portion of right female breast: Secondary | ICD-10-CM

## 2016-06-19 ENCOUNTER — Telehealth: Payer: Self-pay | Admitting: *Deleted

## 2016-06-19 NOTE — Telephone Encounter (Signed)
She can refill 1 month from previous refil

## 2016-06-19 NOTE — Telephone Encounter (Signed)
Daughter left message requesting refill on percocet. Last filled 06/02/16 # 120 tablets.  Attempted to call patient to see how often she is taking the percocet. No answer.  Can take 1 tablet every 4 hours as needed.

## 2016-06-19 NOTE — Telephone Encounter (Signed)
Daughter called requesting refill of percocet for patient today or tomorrow as she works in [place] those days..  Last filled 06/02/16 # 120 tablets. Can take 1 tablet every 4 hours.   Attempted to call patient to see how often she is taking the pills. No answer.

## 2016-06-20 ENCOUNTER — Other Ambulatory Visit: Payer: Self-pay | Admitting: *Deleted

## 2016-06-20 DIAGNOSIS — C50119 Malignant neoplasm of central portion of unspecified female breast: Secondary | ICD-10-CM

## 2016-06-20 MED ORDER — OXYCODONE-ACETAMINOPHEN 5-325 MG PO TABS: 1.0000 | ORAL_TABLET | ORAL | 0 refills | Status: DC | PRN

## 2016-06-27 ENCOUNTER — Other Ambulatory Visit: Payer: Self-pay | Admitting: Hematology and Oncology

## 2016-06-27 ENCOUNTER — Ambulatory Visit
Admission: RE | Admit: 2016-06-27 | Discharge: 2016-06-27 | Disposition: A | Discharge status: Home / Self Care | Payer: Medicare Other | Source: Ambulatory Visit | Attending: Hematology and Oncology | Admitting: Hematology and Oncology

## 2016-06-27 DIAGNOSIS — N6310 Unspecified lump in the right breast, unspecified quadrant: Secondary | ICD-10-CM

## 2016-06-27 DIAGNOSIS — Z853 Personal history of malignant neoplasm of breast: Secondary | ICD-10-CM

## 2016-06-29 ENCOUNTER — Ambulatory Visit
Admission: RE | Admit: 2016-06-29 | Discharge: 2016-06-29 | Disposition: A | Discharge status: Home / Self Care | Payer: Medicare Other | Source: Ambulatory Visit | Attending: Hematology and Oncology | Admitting: Hematology and Oncology

## 2016-06-29 ENCOUNTER — Other Ambulatory Visit: Payer: Medicare Other

## 2016-06-29 DIAGNOSIS — N6310 Unspecified lump in the right breast, unspecified quadrant: Secondary | ICD-10-CM

## 2016-07-07 ENCOUNTER — Other Ambulatory Visit: Payer: Medicare Other

## 2016-07-10 ENCOUNTER — Other Ambulatory Visit: Payer: Self-pay

## 2016-07-10 ENCOUNTER — Telehealth: Payer: Self-pay | Admitting: Medical Oncology

## 2016-07-10 MED ORDER — OXYCODONE-ACETAMINOPHEN 5-325 MG PO TABS: 1.0000 | ORAL_TABLET | ORAL | 0 refills | Status: DC | PRN

## 2016-07-10 NOTE — Telephone Encounter (Signed)
Called to let pt's daughter know the refill she requested for her mother would be available for pick up tomorrow.  Called the number left by her daughter and unable to get through or leave VM.  Called number in chart for pt and left VM with this information.

## 2016-07-10 NOTE — Telephone Encounter (Signed)
Requests percocet refill. Last 9/26. Daughter will be in [place] office tomorrow and wants to pick up rx. Please call her if okay to pick up tomorrow.

## 2016-07-19 ENCOUNTER — Other Ambulatory Visit: Payer: Self-pay | Admitting: Hematology and Oncology

## 2016-07-19 DIAGNOSIS — C50111 Malignant neoplasm of central portion of right female breast: Secondary | ICD-10-CM

## 2016-07-20 ENCOUNTER — Other Ambulatory Visit: Payer: Self-pay

## 2016-07-20 DIAGNOSIS — C50111 Malignant neoplasm of central portion of right female breast: Secondary | ICD-10-CM

## 2016-07-20 MED ORDER — LORAZEPAM 0.5 MG PO TABS: 0.5000 mg | ORAL_TABLET | Freq: Every evening | ORAL | 1 refills | Status: DC | PRN

## 2016-07-27 ENCOUNTER — Other Ambulatory Visit: Payer: Self-pay

## 2016-07-27 MED ORDER — OXYCODONE-ACETAMINOPHEN 5-325 MG PO TABS: 1.0000 | ORAL_TABLET | ORAL | 0 refills | Status: DC | PRN

## 2016-07-27 NOTE — Telephone Encounter (Signed)
Received triage call from [place] stating pt's daughter requesting refill of Percocet.  Upon chart review, refill deemed appropriate.  Pt's daughter states she will come in to get it this afternoon.  No further questions or concerns at time of call.

## 2016-08-10 ENCOUNTER — Telehealth: Payer: Self-pay | Admitting: *Deleted

## 2016-08-10 NOTE — Telephone Encounter (Signed)
"  My mother will run out of pain medicine Sunday.  Are you all open next Friday after Thanksgiving for me to pick up the refill?"  Will notify provider to have the Oxycodone refill ready for pick up next Friday."

## 2016-08-14 NOTE — Telephone Encounter (Signed)
"  We'll be traveling for the [facility] and would like to pick up the oxycodone prescription Wednesday instead of Friday.  I can come in around 2:00 pm.  Could the prescription be ready by this time?"  Message left for collaborative.

## 2016-08-15 ENCOUNTER — Other Ambulatory Visit: Payer: Self-pay

## 2016-08-15 MED ORDER — OXYCODONE-ACETAMINOPHEN 5-325 MG PO TABS: 1.0000 | ORAL_TABLET | ORAL | 0 refills | Status: DC | PRN

## 2016-08-30 ENCOUNTER — Other Ambulatory Visit: Payer: Self-pay

## 2016-08-30 ENCOUNTER — Telehealth: Payer: Self-pay | Admitting: *Deleted

## 2016-08-30 MED ORDER — OXYCODONE-ACETAMINOPHEN 5-325 MG PO TABS: 1.0000 | ORAL_TABLET | ORAL | 0 refills | Status: DC | PRN

## 2016-08-30 NOTE — Telephone Encounter (Signed)
"  I need to pick up mom's pain med refill today or tomorrow.  She'll run out this weekend.  She lives with me in [place].  Emogene Morgan flurries expected in [place] on Friday is why I'd like to pick prescription before Friday."    Call when script ready for pick up [phone].

## 2016-08-30 NOTE — Telephone Encounter (Signed)
Will call pt daughter if meds ready for pick up thank you.

## 2016-08-31 ENCOUNTER — Other Ambulatory Visit: Payer: Self-pay | Admitting: Hematology and Oncology

## 2016-08-31 DIAGNOSIS — C50111 Malignant neoplasm of central portion of right female breast: Secondary | ICD-10-CM

## 2016-09-21 ENCOUNTER — Other Ambulatory Visit: Payer: Self-pay | Admitting: Nurse Practitioner

## 2016-09-27 ENCOUNTER — Other Ambulatory Visit: Payer: Self-pay | Admitting: Emergency Medicine

## 2016-09-27 MED ORDER — OXYCODONE-ACETAMINOPHEN 5-325 MG PO TABS: 1.0000 | ORAL_TABLET | ORAL | 0 refills | Status: DC | PRN

## 2016-10-25 ENCOUNTER — Telehealth: Payer: Self-pay

## 2016-10-25 ENCOUNTER — Other Ambulatory Visit: Payer: Self-pay | Admitting: Emergency Medicine

## 2016-10-25 MED ORDER — OXYCODONE-ACETAMINOPHEN 5-325 MG PO TABS: 1.0000 | ORAL_TABLET | ORAL | 0 refills | Status: DC | PRN

## 2016-10-25 NOTE — Telephone Encounter (Signed)
[name] called stating she lvm yesterday at 11 am and today at 830 am on triage line. She is requesting refill of percocet. She and the pt (her mother) live in [place] and she will be in [place] today and wants to pick up rx.

## 2016-11-20 ENCOUNTER — Other Ambulatory Visit: Payer: Self-pay

## 2016-11-20 ENCOUNTER — Telehealth: Payer: Self-pay | Admitting: *Deleted

## 2016-11-20 DIAGNOSIS — C50111 Malignant neoplasm of central portion of right female breast: Secondary | ICD-10-CM

## 2016-11-20 MED ORDER — OXYCODONE-ACETAMINOPHEN 5-325 MG PO TABS: 1.0000 | ORAL_TABLET | ORAL | 0 refills | Status: DC | PRN

## 2016-11-20 MED ORDER — LORAZEPAM 0.5 MG PO TABS: 0.5000 mg | ORAL_TABLET | Freq: Every evening | ORAL | 1 refills | Status: DC | PRN

## 2016-11-20 NOTE — Progress Notes (Signed)
Pt daughter, [name], picked up prescription for pain medication refill. Notified daughter that medication is not due to be filled until 11/24/16

## 2016-11-20 NOTE — Telephone Encounter (Signed)
"  I need to pick up my mother's Oxycodone 5-325 mg refill today.  Please call me at [phone] when ready for pick up.  I'll be leaving town tomorrow morning."

## 2016-11-20 NOTE — Telephone Encounter (Signed)
Called daughter and lvm to let her know refill can be picked up today. She will not be able to refill medication until 3/2.

## 2016-11-20 NOTE — Telephone Encounter (Signed)
Daughter called and lvm regarding needing a refill prescription for her mom. Pt daughter will be going out of town and wanted to know if she can pick up her mom's prescription today. Refilled prescription for percocet to be picked up today, but refill is dated to be filled on Saturday 3/2. Left vm on daughter's phone.

## 2016-12-20 ENCOUNTER — Other Ambulatory Visit: Payer: Self-pay | Admitting: Emergency Medicine

## 2016-12-20 MED ORDER — OXYCODONE-ACETAMINOPHEN 5-325 MG PO TABS: 1.0000 | ORAL_TABLET | ORAL | 0 refills | Status: DC | PRN

## 2017-01-15 ENCOUNTER — Other Ambulatory Visit: Payer: Self-pay | Admitting: Hematology and Oncology

## 2017-01-15 DIAGNOSIS — C50111 Malignant neoplasm of central portion of right female breast: Secondary | ICD-10-CM

## 2017-01-16 ENCOUNTER — Other Ambulatory Visit: Payer: Self-pay

## 2017-01-16 MED ORDER — OXYCODONE-ACETAMINOPHEN 5-325 MG PO TABS: 1.0000 | ORAL_TABLET | ORAL | 0 refills | Status: DC | PRN

## 2017-01-16 NOTE — Progress Notes (Signed)
Pt daughter called to request pt refill for pain medication. Will call to let daughter know that pt is not due for refill until Saturday 4/28.

## 2017-01-17 ENCOUNTER — Other Ambulatory Visit: Payer: Self-pay

## 2017-01-17 MED ORDER — OXYCODONE-ACETAMINOPHEN 5-325 MG PO TABS: 1.0000 | ORAL_TABLET | ORAL | 0 refills | Status: DC | PRN

## 2017-01-17 NOTE — Telephone Encounter (Signed)
Prescription for percocet picked up by pt's daughter Reggy [facility]

## 2017-02-13 ENCOUNTER — Telehealth: Payer: Self-pay | Admitting: *Deleted

## 2017-02-13 ENCOUNTER — Other Ambulatory Visit: Payer: Self-pay

## 2017-02-13 MED ORDER — OXYCODONE-ACETAMINOPHEN 5-325 MG PO TABS: 1.0000 | ORAL_TABLET | ORAL | 0 refills | Status: DC | PRN

## 2017-02-13 NOTE — Telephone Encounter (Signed)
[name] [facility] called requesting "refill for Percocet 5-325 mg.  Return number when ready for pick up is [phone].  I will be in [place] today or tomorrow is okay.  I have to fly to [place] for a meeting at the [facility]."

## 2017-02-13 NOTE — Telephone Encounter (Signed)
Spoke with daughter and will be picking up script in 1 hr.

## 2017-02-13 NOTE — Telephone Encounter (Signed)
Received vm from daughter that she will be in [place] today for work and requesting for refill for pt. Called and told daughter that she may stop by this afternoon to pick up prescription, but will not be able to fill until this weekend. Pt daughter verbalized understanding.

## 2017-03-04 ENCOUNTER — Other Ambulatory Visit: Payer: Self-pay | Admitting: Hematology and Oncology

## 2017-03-04 DIAGNOSIS — C50111 Malignant neoplasm of central portion of right female breast: Secondary | ICD-10-CM

## 2017-03-05 ENCOUNTER — Other Ambulatory Visit: Payer: Self-pay | Admitting: Hematology and Oncology

## 2017-03-05 DIAGNOSIS — C50111 Malignant neoplasm of central portion of right female breast: Secondary | ICD-10-CM

## 2017-03-07 ENCOUNTER — Telehealth: Payer: Self-pay

## 2017-03-07 NOTE — Telephone Encounter (Signed)
lvm that rx was faxed to pharmacy.

## 2017-03-07 NOTE — Telephone Encounter (Signed)
Family called asking about lorazepam refill. Pharmacy had sent refill request Sunday and Monday. It appears that Dr Lindi Adie printed the Rx on 6/11. Please verify and manage this refill request.

## 2017-03-07 NOTE — Telephone Encounter (Signed)
It was faxed to pharmacy.

## 2017-03-08 ENCOUNTER — Telehealth: Payer: Self-pay | Admitting: *Deleted

## 2017-03-08 ENCOUNTER — Telehealth: Payer: Self-pay

## 2017-03-08 NOTE — Telephone Encounter (Signed)
Received vm from pt daughter stating that her pharmacy never received a faxed of ativan refill. A confirmed faxed was sent yesterday. [facility] [place] [facility] and called in prescription refill for pt.

## 2017-03-08 NOTE — Telephone Encounter (Signed)
"  This is Tabitha Ramirez calling about my mom's lorazepam refill.  [facility].  I called there three times yesterday and they have yet to receive the fax."  This nurse verified pharmacy name and address with caller.  [facility] as was ordered and entered in [place] on 03-05-2017.

## 2017-03-15 ENCOUNTER — Other Ambulatory Visit: Payer: Self-pay | Admitting: Emergency Medicine

## 2017-03-15 MED ORDER — OXYCODONE-ACETAMINOPHEN 5-325 MG PO TABS: 1.0000 | ORAL_TABLET | ORAL | 0 refills | Status: DC | PRN

## 2017-03-23 ENCOUNTER — Ambulatory Visit: Payer: Medicare Other | Admitting: Hematology and Oncology

## 2017-04-10 ENCOUNTER — Other Ambulatory Visit: Payer: Self-pay

## 2017-04-10 MED ORDER — OXYCODONE-ACETAMINOPHEN 5-325 MG PO TABS: 1.0000 | ORAL_TABLET | ORAL | 0 refills | Status: DC | PRN

## 2017-04-10 NOTE — Telephone Encounter (Signed)
Pt daughter called states that she will be in [place] today and will need a refill for pt percocet. Refilled script and notified daughter to come pick it up in the afternoon today. Pt daughter, Tabitha Ramirez confirmed this and will be in today to pick up pain script.

## 2017-04-10 NOTE — Progress Notes (Signed)
Pt daughter, [name], came to pick up pt prescription for percocet.

## 2017-04-24 ENCOUNTER — Encounter: Payer: Self-pay | Admitting: Hematology and Oncology

## 2017-04-24 ENCOUNTER — Ambulatory Visit (HOSPITAL_BASED_OUTPATIENT_CLINIC_OR_DEPARTMENT_OTHER): Payer: Medicare Other | Admitting: Hematology and Oncology

## 2017-04-24 DIAGNOSIS — M8580 Other specified disorders of bone density and structure, unspecified site: Secondary | ICD-10-CM

## 2017-04-24 DIAGNOSIS — G620 Drug-induced polyneuropathy: Secondary | ICD-10-CM

## 2017-04-24 DIAGNOSIS — N644 Mastodynia: Secondary | ICD-10-CM

## 2017-04-24 DIAGNOSIS — Z853 Personal history of malignant neoplasm of breast: Secondary | ICD-10-CM

## 2017-04-24 DIAGNOSIS — Z171 Estrogen receptor negative status [ER-]: Principal | ICD-10-CM

## 2017-04-24 DIAGNOSIS — C50111 Malignant neoplasm of central portion of right female breast: Secondary | ICD-10-CM

## 2017-04-24 MED ORDER — TRAZODONE HCL 100 MG PO TABS: 100.0000 mg | ORAL_TABLET | Freq: Every day | ORAL | Status: DC

## 2017-04-24 NOTE — Progress Notes (Signed)
Patient Care Team: System, Pcp Not In as PCP - General Magrinat, Virgie Dad, MD as Consulting Physician (Oncology)  DIAGNOSIS:  Encounter Diagnosis  Name Primary?  . 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 Ki-67 84%; sentinel lymph node sampling 03/10/2013 showed1/2 SLN pos PR 3% PR 6% Ki-67 15% HER-2 negative      02/10/2013 Breast MRI    Right breast: Rim enhancing necrotic mass 3.4 cm with a 2 cm level I axillary lymph node      03/17/2013 - 08/25/2013 Neo-Adjuvant Chemotherapy    Dose dense Adriamycin and Cytoxan Followed by weekly Taxol and carboplatin x12; chemotherapy-induced anemia and peripheral neuropathy were the complications      01/30/176 Surgery    Right breast lumpectomy scatter residual invasive mass 1.1 cm with [facility] 0/11 lymph nodes no cancer seen: ER 0% PR 0% insufficient to mark it on HER-2 testing      11/12/2013 - 12/24/2013 Radiation Therapy    Radiation therapy to the breast and axilla       CHIEF COMPLIANT: Surveillance of breast cancer, complaining of pain in the right breast and axilla  INTERVAL HISTORY: Tabitha Ramirez is a 71 year old with above-mentioned history of right breast cancer who is currently on surveillance for breast cancer. She complains of pain in the right breast and axilla for the past 5-6 months. In October 2017 she was complaining of the same symptoms and she underwent a mammogram and ultrasound. There was a lymph node that was identified on biopsy. Came back as benign. 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 Her current symptoms are no different than when she had in October 2017. Apart from that she has tenderness in the left breast as well especially when it starts to rain.  REVIEW OF SYSTEMS:   Constitutional: Denies fevers, chills or abnormal weight loss Eyes: Denies blurriness of vision Ears, nose, mouth, throat, and face: Denies mucositis or sore throat Respiratory: Denies cough, dyspnea or wheezes Cardiovascular: Denies palpitation, chest discomfort Gastrointestinal:  Denies nausea, heartburn or change in bowel habits Skin: Denies abnormal skin rashes Lymphatics: Denies new lymphadenopathy or easy bruising Neurological:Denies numbness, tingling or new weaknesses Behavioral/Psych: Mood is stable, no new changes  Extremities: No lower extremity edema Breast: Right pain in the right breast and axilla All other systems were reviewed with the patient and are negative.  I have reviewed the past medical history, past surgical history, social history and family history with the patient and they are unchanged from previous note.  ALLERGIES:  is allergic to penicillins.  MEDICATIONS:  Current Outpatient Prescriptions  Medication Sig Dispense Refill  . acetaminophen (TYLENOL) 500 MG tablet Take 1,000 mg by mouth every 6 (six) hours as needed for mild pain.    Marland Kitchen gabapentin (NEURONTIN) 300 MG capsule Take 1 capsule by mouth once daily. 90 capsule 3  . levothyroxine (SYNTHROID, LEVOTHROID) 75 MCG tablet Take 75 mcg by mouth daily before breakfast.    . LORazepam (ATIVAN) 0.5 MG  tablet TAKE 1 TABLET EVERY DAY AT BEDTIME AS NEEDED 30 tablet 0  . ondansetron (ZOFRAN) 8 MG tablet TAKE 1 TABLET (8 MG TOTAL) BY MOUTH EVERY 12 (TWELVE) HOURS AS NEEDED FOR NAUSEA. 20 tablet 0  . OVER THE COUNTER MEDICATION Take 15 mLs by mouth daily. Reported on 12/01/2015    . oxyCODONE-acetaminophen (PERCOCET/ROXICET) 5-325 MG tablet Take 1 tablet by mouth every 4 (four) hours as  needed for severe pain. 180 tablet 0  . triamterene-hydrochlorothiazide (MAXZIDE-25) 37.5-25 MG per tablet Take 0.5 tablets by mouth every other day. 15 tablet 0   No current facility-administered medications for this visit.     PHYSICAL EXAMINATION: ECOG PERFORMANCE STATUS: 1 - Symptomatic but completely ambulatory  Vitals:   04/24/17 1134  BP: 130/68  Pulse: 73  Resp: 18  Temp: 98.3 F (36.8 C)   Filed Weights   04/24/17 1134  Weight: 137 lb (62.1 kg)    GENERAL:alert, no distress and comfortable SKIN: skin color, texture, turgor are normal, no rashes or significant lesions EYES: normal, Conjunctiva are pink and non-injected, sclera clear OROPHARYNX:no exudate, no erythema and lips, buccal mucosa, and tongue normal  NECK: supple, thyroid normal size, non-tender, without nodularity LYMPH:  no palpable lymphadenopathy in the cervical, axillary or inguinal LUNGS: clear to auscultation and percussion with normal breathing effort HEART: regular rate & rhythm and no murmurs and no lower extremity edema ABDOMEN:abdomen soft, non-tender and normal bowel sounds MUSCULOSKELETAL:no cyanosis of digits and no clubbing  NEURO: alert & oriented x 3 with fluent speech, no focal motor/sensory deficits EXTREMITIES: No lower extremity edema BREAST: No palpable masses or nodules in either right or left breasts. Scar tissue is slightly nodular. No palpable axillary supraclavicular or infraclavicular adenopathy no breast tenderness or nipple discharge. (exam performed in the presence of a chaperone)  LABORATORY DATA:  I have reviewed the data as listed   Chemistry      Component Value Date/Time   NA 140 03/30/2015 0944   K 3.8 03/30/2015 0944   CL 100 05/12/2014 1238   CL 100 03/13/2013 0918   CO2 26 03/30/2015 0944   BUN 18.9 03/30/2015 0944   CREATININE 0.9 03/30/2015 0944      Component Value Date/Time   CALCIUM 9.7 03/30/2015 0944   ALKPHOS 100 03/30/2015 0944   AST 20 03/30/2015  0944   ALT 19 03/30/2015 0944   BILITOT 0.45 03/30/2015 0944       Lab Results  Component Value Date   WBC 6.1 06/22/2014   HGB 10.7 (L) 06/22/2014   HCT 32.5 (L) 06/22/2014   MCV 102.2 (H) 06/22/2014   PLT 284 06/22/2014   NEUTROABS 2.8 06/22/2014    ASSESSMENT & PLAN:  Cancer of central portion of right female breast ([place]) 1. 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 N0 M0 ER PR 0% HER-2 negative status post radiation therapy  2. Severe peripheral neuropathy: Currently coming off gabapentin so that she can drive her car.  3. Breast Cancer Surveillance: 1. Breast exam 04/24/2017 on: No palpable lumps or nodules. Tenderness in the right axilla along the surgical scar. 2. Mammogram to be done October 2018 3. Bone density 03/26/2015: T score -1.9 osteopenia: Currently on calcium and vitamin D 4. Radiation pneumonitis: chest CT scan 03/30/2015 showed right upper lung scarring. Patient does not have any cough or shortness of  breath. 5. Breast tenderness: Previous biopsies were benign in the right breast. Probably related to prior surgery and radiation.   Return to clinic in 1 year for follow-up   I spent 25 minutes talking to the patient of which more than half was spent in counseling and coordination of care.  No orders of the defined types were placed in this encounter.  The patient has a good understanding of the overall plan. she agrees with it. she will call with any problems that may develop before the next visit here.   Rulon Eisenmenger, MD 04/24/17

## 2017-04-24 NOTE — Assessment & Plan Note (Signed)
1. 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 N0 M0 ER PR 0% HER-2 negative status post radiation therapy  2. Severe peripheral neuropathy: Currently on gabapentin, sees neurology sural nerve biopsy was performed by them. Slight improvement in her symptoms.currently takes pain medications. She wants to cut down the gabapentin to once a day at bedtime. I renewed her pain prescription today. I discussed the role of acupuncture as well asTENS unit along with applying menthol to her hands and feet.  3. Breast Cancer Surveillance: 1. Breast exam 04/24/2017 on: Normal 2. Mammogram needs to be done this year 3. Bone density 03/26/2015: T score -1.9 osteopenia: Currently on calcium and vitamin D 4. Radiation pneumonitis: chest CT scan 03/30/2015 showed right upper lung scarring. Patient does not have any cough or shortness of breath.  Return to clinic in 1 year for follow-up

## 2017-05-03 ENCOUNTER — Other Ambulatory Visit: Payer: Self-pay | Admitting: Hematology and Oncology

## 2017-05-03 DIAGNOSIS — C50111 Malignant neoplasm of central portion of right female breast: Secondary | ICD-10-CM

## 2017-05-04 ENCOUNTER — Other Ambulatory Visit: Payer: Self-pay | Admitting: Hematology and Oncology

## 2017-05-04 DIAGNOSIS — C50111 Malignant neoplasm of central portion of right female breast: Secondary | ICD-10-CM

## 2017-05-07 ENCOUNTER — Telehealth: Payer: Self-pay | Admitting: *Deleted

## 2017-05-07 MED ORDER — OXYCODONE-ACETAMINOPHEN 5-325 MG PO TABS: 1.0000 | ORAL_TABLET | ORAL | 0 refills | Status: DC | PRN

## 2017-05-07 NOTE — Telephone Encounter (Signed)
Patient called requesting refill for Percocet 5-325.  Return number when ready for pick up is [phone].  Tabitha Ramirez will only be in town today and tomorrow.

## 2017-06-04 ENCOUNTER — Other Ambulatory Visit: Payer: Self-pay

## 2017-06-04 DIAGNOSIS — C50111 Malignant neoplasm of central portion of right female breast: Secondary | ICD-10-CM

## 2017-06-04 MED ORDER — OXYCODONE-ACETAMINOPHEN 5-325 MG PO TABS: 1.0000 | ORAL_TABLET | ORAL | 0 refills | Status: DC | PRN

## 2017-06-04 MED ORDER — LORAZEPAM 0.5 MG PO TABS: ORAL_TABLET | ORAL | 0 refills | Status: DC

## 2017-07-02 ENCOUNTER — Other Ambulatory Visit: Payer: Self-pay

## 2017-07-02 MED ORDER — OXYCODONE-ACETAMINOPHEN 5-325 MG PO TABS: 1.0000 | ORAL_TABLET | ORAL | 0 refills | Status: DC | PRN

## 2017-07-02 NOTE — Telephone Encounter (Signed)
Received call from pt daughter, [name], requesting pt refill on her percocet. Due for refill on 07/03/17. May come and pick it up by tomorrow. LVM on Michelle's phone.

## 2017-07-17 ENCOUNTER — Other Ambulatory Visit: Payer: Self-pay | Admitting: Hematology and Oncology

## 2017-07-17 DIAGNOSIS — C50111 Malignant neoplasm of central portion of right female breast: Secondary | ICD-10-CM

## 2017-08-02 ENCOUNTER — Other Ambulatory Visit: Payer: Self-pay

## 2017-08-02 MED ORDER — OXYCODONE-ACETAMINOPHEN 5-325 MG PO TABS: 1.0000 | ORAL_TABLET | ORAL | 0 refills | Status: DC | PRN

## 2017-08-02 NOTE — Telephone Encounter (Signed)
Returned Daughter Michelle's VM and left VM informed her that her mothers script refill will be available for pick up today.  Cyndia Bent RN

## 2017-08-17 ENCOUNTER — Telehealth: Payer: Self-pay | Admitting: *Deleted

## 2017-08-17 ENCOUNTER — Other Ambulatory Visit: Payer: Self-pay | Admitting: Hematology and Oncology

## 2017-08-17 DIAGNOSIS — C50111 Malignant neoplasm of central portion of right female breast: Secondary | ICD-10-CM

## 2017-08-17 MED ORDER — LORAZEPAM 1 MG PO TABS: ORAL_TABLET | ORAL | 1 refills | Status: DC

## 2017-08-17 NOTE — Telephone Encounter (Signed)
"  [facility]ivan for my mother, calling because Pharmacy has not received refill and asked me to call.  1 mg tablets, take She uses this for anxiety and sleep." Refill called in at this time.  No further questions or request at this time.

## 2017-08-31 ENCOUNTER — Other Ambulatory Visit: Payer: Self-pay

## 2017-08-31 MED ORDER — OXYCODONE-ACETAMINOPHEN 5-325 MG PO TABS: 1.0000 | ORAL_TABLET | ORAL | 0 refills | Status: DC | PRN

## 2017-09-04 ENCOUNTER — Telehealth: Payer: Self-pay

## 2017-09-04 ENCOUNTER — Telehealth: Payer: Self-pay | Admitting: *Deleted

## 2017-09-04 NOTE — Telephone Encounter (Signed)
"  Ive left messages to pick up pain medicine refills for my mom.  I'm at my in[place]laws today only.  Can be reached at [phone]."

## 2017-09-04 NOTE — Telephone Encounter (Signed)
Pt called for percocet refill

## 2017-10-02 ENCOUNTER — Other Ambulatory Visit: Payer: Self-pay

## 2017-10-02 MED ORDER — OXYCODONE-ACETAMINOPHEN 5-325 MG PO TABS: 1.0000 | ORAL_TABLET | ORAL | 0 refills | Status: DC | PRN

## 2017-10-02 NOTE — Telephone Encounter (Signed)
Received VM from patient's daughter for refill for Percocet.  Returned her call, informed her that the refill prescription would be ready for pick up tomorrow. No further needs at this time.

## 2017-10-15 ENCOUNTER — Other Ambulatory Visit: Payer: Self-pay | Admitting: Hematology and Oncology

## 2017-10-30 ENCOUNTER — Other Ambulatory Visit: Payer: Self-pay

## 2017-10-30 DIAGNOSIS — C50111 Malignant neoplasm of central portion of right female breast: Secondary | ICD-10-CM

## 2017-10-30 DIAGNOSIS — Z171 Estrogen receptor negative status [ER-]: Principal | ICD-10-CM

## 2017-10-30 MED ORDER — OXYCODONE-ACETAMINOPHEN 5-325 MG PO TABS: 1.0000 | ORAL_TABLET | ORAL | 0 refills | Status: DC | PRN

## 2017-10-30 NOTE — Telephone Encounter (Signed)
Received VM from pt's daughter [name] regarding Percocet refill for patient.  Submitted prescription for Dr Lindi Adie to approve and left VM for daughter to let her know prescription would be ready on Thurs 11/01/17 to be picked up at front lobby of [facility].

## 2017-11-01 ENCOUNTER — Telehealth: Payer: Self-pay | Admitting: *Deleted

## 2017-11-01 NOTE — Telephone Encounter (Signed)
"  I planned to come today to pick up my mother's pain medication refill, I'm in D/C at the [facility].  Our pharmacist says this type of medicine can be called in.  Can this be done?  My flight will not allow me to come in by 4:15 to pick up her prescription."  Collaborative reports provider provides prescriptions.  No scheduled medicines called or sent electronically at this time.  Second call.Tabitha Ramirez "I will not be able to fly in until tomorrow."

## 2017-11-28 ENCOUNTER — Telehealth: Payer: Self-pay

## 2017-11-28 ENCOUNTER — Other Ambulatory Visit: Payer: Self-pay

## 2017-11-28 DIAGNOSIS — Z171 Estrogen receptor negative status [ER-]: Principal | ICD-10-CM

## 2017-11-28 DIAGNOSIS — C50111 Malignant neoplasm of central portion of right female breast: Secondary | ICD-10-CM

## 2017-11-28 MED ORDER — OXYCODONE-ACETAMINOPHEN 5-325 MG PO TABS: 1.0000 | ORAL_TABLET | ORAL | 0 refills | Status: DC | PRN

## 2017-11-28 NOTE — Telephone Encounter (Signed)
Called pt daughter to inform her script would be available for pick up anytime this week.  Cyndia Bent RN

## 2017-12-10 ENCOUNTER — Other Ambulatory Visit: Payer: Self-pay | Admitting: Hematology and Oncology

## 2017-12-26 ENCOUNTER — Other Ambulatory Visit: Payer: Self-pay

## 2017-12-26 ENCOUNTER — Telehealth: Payer: Self-pay | Admitting: *Deleted

## 2017-12-26 DIAGNOSIS — Z171 Estrogen receptor negative status [ER-]: Principal | ICD-10-CM

## 2017-12-26 DIAGNOSIS — C50111 Malignant neoplasm of central portion of right female breast: Secondary | ICD-10-CM

## 2017-12-26 MED ORDER — OXYCODONE-ACETAMINOPHEN 5-325 MG PO TABS: 1.0000 | ORAL_TABLET | ORAL | 0 refills | Status: DC | PRN

## 2017-12-26 NOTE — Telephone Encounter (Signed)
Called and left VM  Informing patients daughter script would be available for pick up tomorrow.  Cyndia Bent RN

## 2017-12-26 NOTE — Telephone Encounter (Signed)
[name] "[name] [place] calling requesting refill for Mom's pain medicine.  Will be in [place] tomorrow to pick up the prescription."

## 2018-01-24 ENCOUNTER — Other Ambulatory Visit: Payer: Self-pay

## 2018-01-24 DIAGNOSIS — Z171 Estrogen receptor negative status [ER-]: Principal | ICD-10-CM

## 2018-01-24 DIAGNOSIS — C50111 Malignant neoplasm of central portion of right female breast: Secondary | ICD-10-CM

## 2018-01-24 MED ORDER — OXYCODONE-ACETAMINOPHEN 5-325 MG PO TABS: 1.0000 | ORAL_TABLET | ORAL | 0 refills | Status: DC | PRN

## 2018-01-24 NOTE — Telephone Encounter (Signed)
LVM for patients daughter, [name] to inform her script would be ready for pick up.  Cyndia Bent RN

## 2018-02-03 ENCOUNTER — Other Ambulatory Visit: Payer: Self-pay | Admitting: Hematology and Oncology

## 2018-02-04 ENCOUNTER — Other Ambulatory Visit: Payer: Self-pay

## 2018-02-04 MED ORDER — LORAZEPAM 0.5 MG PO TABS: 0.5000 mg | ORAL_TABLET | Freq: Every evening | ORAL | 1 refills | Status: DC | PRN

## 2018-02-20 ENCOUNTER — Telehealth: Payer: Self-pay | Admitting: Hematology and Oncology

## 2018-02-20 NOTE — Telephone Encounter (Signed)
Patients daughter called to reschedule  °

## 2018-02-25 ENCOUNTER — Other Ambulatory Visit: Payer: Self-pay

## 2018-02-25 DIAGNOSIS — Z171 Estrogen receptor negative status [ER-]: Principal | ICD-10-CM

## 2018-02-25 DIAGNOSIS — C50111 Malignant neoplasm of central portion of right female breast: Secondary | ICD-10-CM

## 2018-02-25 MED ORDER — OXYCODONE-ACETAMINOPHEN 5-325 MG PO TABS: 1.0000 | ORAL_TABLET | ORAL | 0 refills | Status: DC | PRN

## 2018-02-25 NOTE — Telephone Encounter (Signed)
Returned pt's daughter Michelle's call regarding prescription for percocet for patient. No answer, left VM that Dr Lindi Adie completed prescription and it is available for pick-up in cancer center.

## 2018-03-21 ENCOUNTER — Other Ambulatory Visit: Payer: Self-pay

## 2018-03-21 DIAGNOSIS — C50111 Malignant neoplasm of central portion of right female breast: Secondary | ICD-10-CM

## 2018-03-21 DIAGNOSIS — Z171 Estrogen receptor negative status [ER-]: Principal | ICD-10-CM

## 2018-03-21 MED ORDER — OXYCODONE-ACETAMINOPHEN 5-325 MG PO TABS: 1.0000 | ORAL_TABLET | ORAL | 0 refills | Status: DC | PRN

## 2018-03-21 NOTE — Telephone Encounter (Signed)
Called daugther and lvm with call back number to let her know that pt percocet will not be ready for refill until 03/26/18.   Pt daughter may come and pick up her percocet script for pt today or tomorrow.

## 2018-04-04 ENCOUNTER — Other Ambulatory Visit: Payer: Self-pay | Admitting: Hematology and Oncology

## 2018-04-23 ENCOUNTER — Other Ambulatory Visit: Payer: Self-pay

## 2018-04-23 DIAGNOSIS — Z171 Estrogen receptor negative status [ER-]: Principal | ICD-10-CM

## 2018-04-23 DIAGNOSIS — C50111 Malignant neoplasm of central portion of right female breast: Secondary | ICD-10-CM

## 2018-04-23 MED ORDER — OXYCODONE-ACETAMINOPHEN 5-325 MG PO TABS: 1.0000 | ORAL_TABLET | ORAL | 0 refills | Status: DC | PRN

## 2018-04-23 NOTE — Telephone Encounter (Signed)
Pt's daughter [name] coming into town today and would like to pick up pt's prescription for Percocet.  She is listed on pt's release as someone we have permission to speak with. Dr Lindi Adie completed prescription and I called daughter back to let her know she can pick it up from cancer center for patient. No other needs per pt's daughter at this time.

## 2018-04-24 ENCOUNTER — Ambulatory Visit: Payer: Medicare Other | Admitting: Hematology and Oncology

## 2018-04-28 NOTE — Assessment & Plan Note (Addendum)
1. 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 N0 M0 ER PR 0% HER-2 negative status post radiation therapy  2. Severe peripheral neuropathy: Currently on gabapentin, sees neurology sural nerve biopsy was performed by them. Slight improvement in her symptoms.currently takes pain medications. She wants to cut down the gabapentin to once a day at bedtime. I renewed her pain prescription today. I discussed the role of acupuncture as well asTENS unit along with applying menthol to her hands and feet.  Breast Cancer Surveillance: 1. Breast exam 04/28/18 : Normal 2. Mammogram needs to be done this year 3. Bone density 03/26/2015: T score -1.9 osteopenia: Currently on calcium and vitamin D 4. Radiation pneumonitis: chest CT scan 03/30/2015 showed right upper lung scarring. Patient does not have any cough or shortness of breath.  Return to clinic in 1 year for follow-up

## 2018-04-29 ENCOUNTER — Inpatient Hospital Stay: Payer: Medicare Other | Attending: Hematology and Oncology | Admitting: Hematology and Oncology

## 2018-04-29 ENCOUNTER — Telehealth: Payer: Self-pay | Admitting: Adult Health

## 2018-04-29 DIAGNOSIS — Z9221 Personal history of antineoplastic chemotherapy: Secondary | ICD-10-CM | POA: Diagnosis not present

## 2018-04-29 DIAGNOSIS — Z171 Estrogen receptor negative status [ER-]: Secondary | ICD-10-CM

## 2018-04-29 DIAGNOSIS — C50111 Malignant neoplasm of central portion of right female breast: Secondary | ICD-10-CM

## 2018-04-29 DIAGNOSIS — Z923 Personal history of irradiation: Secondary | ICD-10-CM | POA: Diagnosis not present

## 2018-04-29 DIAGNOSIS — Z853 Personal history of malignant neoplasm of breast: Secondary | ICD-10-CM

## 2018-04-29 DIAGNOSIS — M8580 Other specified disorders of bone density and structure, unspecified site: Secondary | ICD-10-CM

## 2018-04-29 DIAGNOSIS — R32 Unspecified urinary incontinence: Secondary | ICD-10-CM | POA: Diagnosis not present

## 2018-04-29 MED ORDER — CLOPIDOGREL BISULFATE 75 MG PO TABS: 75.0000 mg | ORAL_TABLET | Freq: Every day | ORAL | Status: DC

## 2018-04-29 MED ORDER — VITAMIN B-12 1000 MCG PO TABS: 1000.0000 ug | ORAL_TABLET | Freq: Every day | ORAL | Status: DC

## 2018-04-29 NOTE — Progress Notes (Signed)
Patient Care Team: System, Pcp Not In as PCP - General Magrinat, Virgie Dad, MD as Consulting Physician (Oncology)  DIAGNOSIS:  Encounter Diagnosis  Name Primary?  . 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 Ki-67 84%; sentinel lymph node sampling 03/10/2013 showed1/2 SLN pos PR 3% PR 6% Ki-67 15% HER-2 negative      02/10/2013 Breast MRI    Right breast: Rim enhancing necrotic mass 3.4 cm with a 2 cm level I axillary lymph node      03/17/2013 - 08/25/2013 Neo-Adjuvant Chemotherapy    Dose dense Adriamycin and Cytoxan Followed by weekly Taxol and carboplatin x12; chemotherapy-induced anemia and peripheral neuropathy were the complications      11/25/8248 Surgery    Right breast lumpectomy scatter residual invasive mass 1.1 cm with [facility] 0/11 lymph nodes no cancer seen: ER 0% PR 0% insufficient to mark it on HER-2 testing      11/12/2013 - 12/24/2013 Radiation Therapy    Radiation therapy to the breast and axilla       CHIEF COMPLIANT: Hospitalization for pneumonia and paralysis in April at [place]: Tabitha Ramirez is a 72 year old with above-mentioned history of right breast cancer treated with neoadjuvant chemotherapy followed by lumpectomy and radiation and is currently in surveillance.  She was in decent health until April when she presented with pneumonia and apparently had lower extremity paralysis.  Spent about a month in rehab and finally was able to get back on her feet.  She is complaining of urinary incontinence.  REVIEW  OF SYSTEMS:   Constitutional: Denies fevers, chills or abnormal weight loss Eyes: Denies blurriness of vision Ears, nose, mouth, throat, and face: Denies mucositis or sore throat Respiratory: Denies cough, dyspnea or wheezes Cardiovascular: Denies palpitation, chest discomfort Gastrointestinal:  Denies nausea, heartburn or change in bowel habits Skin: Denies abnormal skin rashes Lymphatics: Denies new lymphadenopathy or easy bruising Neurological: urinary incontinence Behavioral/Psych: Mood is stable, no new changes  Extremities: No lower extremity edema Breast: Pain in the right axilla All other systems were reviewed with the patient and are negative.  I have reviewed the past medical history, past surgical history, social history and family history with the patient and they are unchanged from previous note.  ALLERGIES:  is allergic to penicillins.  MEDICATIONS:  Current Outpatient Medications  Medication Sig Dispense Refill  . acetaminophen (TYLENOL) 500 MG tablet Take 1,000 mg by mouth every 6 (six) hours as needed for mild pain.    Marland Kitchen levothyroxine (SYNTHROID, LEVOTHROID) 75 MCG tablet Take 75 mcg by mouth daily before breakfast.    . LORazepam (ATIVAN) 0.5 MG tablet TAKE 1 TABLET BY MOUTH AT BEDTIME AS NEEDED 30 tablet 1  . LORazepam (ATIVAN) 1 MG tablet Take 1 tablet by mouth at bedtime as needed. 30 tablet 1  . ondansetron (ZOFRAN) 8 MG tablet TAKE 1 TABLET (8 MG TOTAL) BY MOUTH EVERY 12 (TWELVE) HOURS AS NEEDED FOR NAUSEA. 20 tablet 0  . OVER THE COUNTER MEDICATION Take 15 mLs by  mouth daily. Reported on 12/01/2015    . oxyCODONE-acetaminophen (PERCOCET/ROXICET) 5-325 MG tablet Take 1 tablet by mouth every 4 (four) hours as needed for severe pain. 180 tablet 0  . traZODone (DESYREL) 100 MG tablet Take 1 tablet (100 mg total) by mouth at bedtime.    . triamterene-hydrochlorothiazide (MAXZIDE-25) 37.5-25 MG per tablet Take 0.5 tablets by mouth every other day. 15 tablet 0   No  current facility-administered medications for this visit.     PHYSICAL EXAMINATION: ECOG PERFORMANCE STATUS: 1 - Symptomatic but completely ambulatory  Vitals:   04/29/18 1520  BP: (!) 156/93  Pulse: 76  Resp: 18  Temp: 98.3 F (36.8 C)  SpO2: 100%   Filed Weights   04/29/18 1520  Weight: 137 lb 14.4 oz (62.6 kg)    GENERAL:alert, no distress and comfortable SKIN: skin color, texture, turgor are normal, no rashes or significant lesions EYES: normal, Conjunctiva are pink and non-injected, sclera clear OROPHARYNX:no exudate, no erythema and lips, buccal mucosa, and tongue normal  NECK: supple, thyroid normal size, non-tender, without nodularity LYMPH:  no palpable lymphadenopathy in the cervical, axillary or inguinal LUNGS: clear to auscultation and percussion with normal breathing effort HEART: regular rate & rhythm and no murmurs and no lower extremity edema ABDOMEN:abdomen soft, non-tender and normal bowel sounds MUSCULOSKELETAL:no cyanosis of digits and no clubbing  NEURO: alert & oriented x 3 with fluent speech, no focal motor/sensory deficits EXTREMITIES: No lower extremity edema BREAST: No palpable masses or nodules in either right or left breasts. No palpable axillary supraclavicular or infraclavicular adenopathy no breast tenderness or nipple discharge. (exam performed in the presence of a chaperone)  LABORATORY DATA:  I have reviewed the data as listed CMP Latest Ref Rng & Units 03/30/2015 06/22/2014 05/12/2014  Glucose 70 - 140 mg/dl 103 119 107(H)  BUN 7.0 - 26.0 mg/dL 18.9 17.2 18  Creatinine 0.6 - 1.1 mg/dL 0.9 1.0 0.82  Sodium 136 - 145 mEq/L 140 143 140  Potassium 3.5 - 5.1 mEq/L 3.8 3.3(L) 3.4(L)  Chloride 96 - 112 mEq/L - - 100  CO2 22 - 29 mEq/L _0 Calcium 8.4 - 10.4 mg/dL 9.7 9.7 9.7  Total Protein 6.4 - 8.3 g/dL 7.9 9.0(H) -  Total Bilirubin 0.20 - 1.20 mg/dL 0.45 0.45 -  Alkaline Phos 40 - 150 U/L 100 92 -  AST 5 - 34 U/L 20 26 -  ALT 0 - 55 U/L  19 21 -    Lab Results  Component Value Date   WBC 6.1 06/22/2014   HGB 10.7 (L) 06/22/2014   HCT 32.5 (L) 06/22/2014   MCV 102.2 (H) 06/22/2014   PLT 284 06/22/2014   NEUTROABS 2.8 06/22/2014    ASSESSMENT & PLAN:  Cancer of central portion of right female breast ([place]) 1. 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 N0 M0 ER PR 0% HER-2 negative status post radiation therapy  2. Severe peripheral neuropathy: Currently on gabapentin, sees neurology sural nerve biopsy was performed by them. Slight improvement in her symptoms.currently takes pain medications. She wants to cut down the gabapentin to once a day at bedtime. I renewed her pain prescription today. I discussed the role of acupuncture as well asTENS unit along with applying menthol to her hands and feet.  Breast Cancer Surveillance: 1. Breast exam 04/28/18 : Normal 2. Mammogram needs  to be done this year 3. Bone density 03/26/2015: T score -1.9 osteopenia: Currently on calcium and vitamin D 4. Radiation pneumonitis: chest CT scan 03/30/2015 showed right upper lung scarring. Patient does not have any cough or shortness of breath.  April 2019 at [facility]: Hospitalization for pneumonia and lower extremity paralysis: Spent 1 month in rehab and is doing much better. Return to clinic in 1 year for follow-up with long-term survivorship. No orders of the defined types were placed in this encounter.  The patient has a good understanding of the overall plan. she agrees with it. she will call with any problems that may develop before the next visit here.   Harriette Ohara, MD 04/29/18

## 2018-04-29 NOTE — Telephone Encounter (Signed)
Patient decline avs and calendar °

## 2018-05-20 ENCOUNTER — Other Ambulatory Visit: Payer: Self-pay

## 2018-05-20 DIAGNOSIS — Z171 Estrogen receptor negative status [ER-]: Principal | ICD-10-CM

## 2018-05-20 DIAGNOSIS — C50111 Malignant neoplasm of central portion of right female breast: Secondary | ICD-10-CM

## 2018-05-20 MED ORDER — OXYCODONE-ACETAMINOPHEN 5-325 MG PO TABS: 1.0000 | ORAL_TABLET | ORAL | 0 refills | Status: DC | PRN

## 2018-05-24 ENCOUNTER — Ambulatory Visit
Admission: RE | Admit: 2018-05-24 | Discharge: 2018-05-24 | Disposition: A | Discharge status: Home / Self Care | Payer: Medicare Other | Source: Ambulatory Visit | Attending: Hematology and Oncology | Admitting: Hematology and Oncology

## 2018-05-24 DIAGNOSIS — Z171 Estrogen receptor negative status [ER-]: Principal | ICD-10-CM

## 2018-05-24 DIAGNOSIS — C50111 Malignant neoplasm of central portion of right female breast: Secondary | ICD-10-CM

## 2018-05-24 HISTORY — DX: Personal history of irradiation: Z92.3

## 2018-05-24 HISTORY — DX: Personal history of antineoplastic chemotherapy: Z92.21

## 2018-06-03 ENCOUNTER — Other Ambulatory Visit: Payer: Self-pay | Admitting: Hematology and Oncology

## 2018-06-14 ENCOUNTER — Other Ambulatory Visit: Payer: Self-pay

## 2018-06-14 DIAGNOSIS — Z171 Estrogen receptor negative status [ER-]: Principal | ICD-10-CM

## 2018-06-14 DIAGNOSIS — C50111 Malignant neoplasm of central portion of right female breast: Secondary | ICD-10-CM

## 2018-06-14 MED ORDER — OXYCODONE-ACETAMINOPHEN 5-325 MG PO TABS: 1.0000 | ORAL_TABLET | ORAL | 0 refills | Status: DC | PRN

## 2018-06-14 NOTE — Telephone Encounter (Signed)
Pt daughter called to see if she can come today and pick up pt refill pain prescription. Pt not due until next Thursday, but they live in [place] and daughter coming to [place] today. Per Dr.Gudena, okay to have pt pick up prescription today and fill it next week.

## 2018-07-15 ENCOUNTER — Telehealth: Payer: Self-pay

## 2018-07-15 ENCOUNTER — Other Ambulatory Visit: Payer: Self-pay

## 2018-07-15 DIAGNOSIS — Z171 Estrogen receptor negative status [ER-]: Principal | ICD-10-CM

## 2018-07-15 DIAGNOSIS — C50111 Malignant neoplasm of central portion of right female breast: Secondary | ICD-10-CM

## 2018-07-15 MED ORDER — OXYCODONE-ACETAMINOPHEN 5-325 MG PO TABS: 1.0000 | ORAL_TABLET | ORAL | 0 refills | Status: DC | PRN

## 2018-07-15 NOTE — Telephone Encounter (Signed)
Returned patient's daughter call requesting refill on Percocet.  Verbal orders given by Dr. Lindi Adie.  [name] aware that RX is available for pick up.  Hard scrip placed in medication book in triage.

## 2018-08-15 ENCOUNTER — Other Ambulatory Visit: Payer: Self-pay

## 2018-08-15 ENCOUNTER — Telehealth: Payer: Self-pay

## 2018-08-15 DIAGNOSIS — C50111 Malignant neoplasm of central portion of right female breast: Secondary | ICD-10-CM

## 2018-08-15 DIAGNOSIS — Z171 Estrogen receptor negative status [ER-]: Principal | ICD-10-CM

## 2018-08-15 MED ORDER — OXYCODONE-ACETAMINOPHEN 5-325 MG PO TABS: 1.0000 | ORAL_TABLET | ORAL | 0 refills | Status: DC | PRN

## 2018-08-15 NOTE — Telephone Encounter (Signed)
[name] request refill for Oxycodone.  Daughter will be in town tomorrow.  Ok per Dr. Lindi Adie for refill.  Daughter, Tabitha Ramirez, aware.  No further needs at this time.  RX placed in script book.

## 2018-08-16 ENCOUNTER — Other Ambulatory Visit: Payer: Self-pay

## 2018-08-16 DIAGNOSIS — Z171 Estrogen receptor negative status [ER-]: Principal | ICD-10-CM

## 2018-08-16 DIAGNOSIS — C50111 Malignant neoplasm of central portion of right female breast: Secondary | ICD-10-CM

## 2018-08-16 MED ORDER — OXYCODONE-ACETAMINOPHEN 5-325 MG PO TABS: 1.0000 | ORAL_TABLET | ORAL | 0 refills | Status: DC | PRN

## 2018-09-12 ENCOUNTER — Other Ambulatory Visit: Payer: Self-pay

## 2018-09-12 DIAGNOSIS — C50111 Malignant neoplasm of central portion of right female breast: Secondary | ICD-10-CM

## 2018-09-12 DIAGNOSIS — Z171 Estrogen receptor negative status [ER-]: Principal | ICD-10-CM

## 2018-09-12 MED ORDER — OXYCODONE-ACETAMINOPHEN 5-325 MG PO TABS: 1.0000 | ORAL_TABLET | ORAL | 0 refills | Status: DC | PRN

## 2018-09-12 NOTE — Telephone Encounter (Signed)
Pt daughter will be in town tomorrow from [place] and would like to know if she can pick up her mom's prescription early. Due to be refilled next Monday. Ok per MD. Rockey Situ pt daughter that she may come and pick it up at front desk lobby tomorrow. Daughter appreciative of time/call.

## 2018-09-13 ENCOUNTER — Telehealth: Payer: Self-pay | Admitting: *Deleted

## 2018-09-13 NOTE — Progress Notes (Signed)
Script placed in Rx book in triage.  Patient's caregiver was notified by triage nurse.

## 2018-09-13 NOTE — Telephone Encounter (Signed)
"  Allysen Lazo Brandle's daughter [name] calling to ask if someone else can sign for my mother's prescription today.  I'm sick and cannot come.  Her boyfriend is on his way now."  For prescriptions you must provide ID.  State ID/Driver's License, [employer] ID or Pass port are acceptable ID's.  Denies further questions or needs at this time.

## 2018-10-03 ENCOUNTER — Other Ambulatory Visit: Payer: Self-pay

## 2018-10-03 MED ORDER — LORAZEPAM 0.5 MG PO TABS: 0.5000 mg | ORAL_TABLET | Freq: Every evening | ORAL | 1 refills | Status: DC | PRN

## 2018-10-07 ENCOUNTER — Telehealth: Payer: Self-pay | Admitting: *Deleted

## 2018-10-07 ENCOUNTER — Other Ambulatory Visit: Payer: Self-pay | Admitting: Hematology and Oncology

## 2018-10-07 DIAGNOSIS — C50111 Malignant neoplasm of central portion of right female breast: Secondary | ICD-10-CM

## 2018-10-07 DIAGNOSIS — Z171 Estrogen receptor negative status [ER-]: Principal | ICD-10-CM

## 2018-10-07 MED ORDER — LORAZEPAM 0.5 MG PO TABS: 0.5000 mg | ORAL_TABLET | Freq: Every evening | ORAL | 0 refills | Status: DC | PRN

## 2018-10-07 NOTE — Addendum Note (Signed)
Addended by: Cherylynn Ridges on: 10/07/2018 12:48 PM   Modules accepted: Orders

## 2018-10-07 NOTE — Telephone Encounter (Signed)
See previous phone note.  

## 2018-10-14 ENCOUNTER — Other Ambulatory Visit: Payer: Self-pay | Admitting: Hematology and Oncology

## 2018-10-14 ENCOUNTER — Telehealth: Payer: Self-pay

## 2018-10-14 DIAGNOSIS — C50111 Malignant neoplasm of central portion of right female breast: Secondary | ICD-10-CM

## 2018-10-14 DIAGNOSIS — Z171 Estrogen receptor negative status [ER-]: Principal | ICD-10-CM

## 2018-10-14 MED ORDER — OXYCODONE-ACETAMINOPHEN 5-325 MG PO TABS: 1.0000 | ORAL_TABLET | ORAL | 0 refills | Status: DC | PRN

## 2018-10-14 NOTE — Telephone Encounter (Signed)
Called and spoke with pt daughter, [name]. Per Dr.Gudena, ok to refill percocet today. Pt daughter will pick it up in [facility] pharmacy today.

## 2018-10-14 NOTE — Telephone Encounter (Signed)
Pt daughter, [name], called to request a refill prescription for her mom. Pt daughter travels extensively and is in [place], once a month. She will be in town all day today and would like to know if she can pick up her mother's prescription today. Told pt that Dr.Gudena can send it in electronically to her pharmacy when its due. Pt not due until Wednesday. Will notify Dr.Gudena and will have it sent to preferred pharmacy on her due fill date. Pt daughter verbalized understanding.

## 2018-11-11 ENCOUNTER — Other Ambulatory Visit: Payer: Self-pay | Admitting: Hematology and Oncology

## 2018-11-11 ENCOUNTER — Telehealth: Payer: Self-pay

## 2018-11-11 DIAGNOSIS — C50111 Malignant neoplasm of central portion of right female breast: Secondary | ICD-10-CM

## 2018-11-11 DIAGNOSIS — Z171 Estrogen receptor negative status [ER-]: Principal | ICD-10-CM

## 2018-11-11 MED ORDER — OXYCODONE-ACETAMINOPHEN 5-325 MG PO TABS: 1.0000 | ORAL_TABLET | ORAL | 0 refills | Status: DC | PRN

## 2018-11-11 NOTE — Telephone Encounter (Signed)
Pt's daugther, [name], aware that Rx has been sent to pharmacy by Dr. Lindi Adie.

## 2018-12-09 ENCOUNTER — Telehealth: Payer: Self-pay | Admitting: *Deleted

## 2018-12-09 ENCOUNTER — Other Ambulatory Visit: Payer: Self-pay | Admitting: *Deleted

## 2018-12-09 ENCOUNTER — Other Ambulatory Visit: Payer: Self-pay | Admitting: Hematology and Oncology

## 2018-12-09 DIAGNOSIS — Z171 Estrogen receptor negative status [ER-]: Principal | ICD-10-CM

## 2018-12-09 DIAGNOSIS — C50111 Malignant neoplasm of central portion of right female breast: Secondary | ICD-10-CM

## 2018-12-09 MED ORDER — OXYCODONE-ACETAMINOPHEN 5-325 MG PO TABS: 1.0000 | ORAL_TABLET | ORAL | 0 refills | Status: DC | PRN

## 2018-12-09 NOTE — Telephone Encounter (Signed)
Tabitha Ramirez daughter Tabitha Ramirez [phone]) called requesting "refill status for oxycodone.  Send to [place]. and call when order completed."

## 2018-12-09 NOTE — Telephone Encounter (Signed)
Tell her I refilled it [facility]

## 2018-12-10 NOTE — Telephone Encounter (Signed)
Message left notifying of Oxycodone refill.

## 2019-01-06 ENCOUNTER — Other Ambulatory Visit: Payer: Self-pay | Admitting: Hematology and Oncology

## 2019-01-06 ENCOUNTER — Telehealth: Payer: Self-pay

## 2019-01-06 DIAGNOSIS — C50111 Malignant neoplasm of central portion of right female breast: Secondary | ICD-10-CM

## 2019-01-06 DIAGNOSIS — Z171 Estrogen receptor negative status [ER-]: Principal | ICD-10-CM

## 2019-01-06 MED ORDER — OXYCODONE-ACETAMINOPHEN 5-325 MG PO TABS: 1.0000 | ORAL_TABLET | ORAL | 0 refills | Status: DC | PRN

## 2019-01-06 NOTE — Telephone Encounter (Signed)
Refilled requested for Oxycodone 5/325.    MD reviewed, Rx sent.  Pt's daughter aware.

## 2019-02-03 ENCOUNTER — Other Ambulatory Visit: Payer: Self-pay | Admitting: Hematology and Oncology

## 2019-02-03 DIAGNOSIS — C50111 Malignant neoplasm of central portion of right female breast: Secondary | ICD-10-CM

## 2019-02-03 MED ORDER — OXYCODONE-ACETAMINOPHEN 5-325 MG PO TABS: 1.0000 | ORAL_TABLET | ORAL | 0 refills | Status: DC | PRN

## 2019-02-03 NOTE — Progress Notes (Signed)
Daughter called requesting refill on Oxycodone.  Pt's daughter is flying back home (out of state) today, requesting to pick up before leaving.    MD Reviewed, OK.  Refill sent.  Nurse left message with patient's daughter.

## 2019-03-05 ENCOUNTER — Other Ambulatory Visit: Payer: Self-pay | Admitting: Hematology and Oncology

## 2019-03-05 DIAGNOSIS — C50111 Malignant neoplasm of central portion of right female breast: Secondary | ICD-10-CM

## 2019-03-05 DIAGNOSIS — Z171 Estrogen receptor negative status [ER-]: Secondary | ICD-10-CM

## 2019-03-05 MED ORDER — OXYCODONE-ACETAMINOPHEN 5-325 MG PO TABS: 1.0000 | ORAL_TABLET | ORAL | 0 refills | Status: DC | PRN

## 2019-04-01 ENCOUNTER — Other Ambulatory Visit: Payer: Self-pay | Admitting: Hematology and Oncology

## 2019-04-01 DIAGNOSIS — C50111 Malignant neoplasm of central portion of right female breast: Secondary | ICD-10-CM

## 2019-04-01 MED ORDER — OXYCODONE-ACETAMINOPHEN 5-325 MG PO TABS: 1.0000 | ORAL_TABLET | ORAL | 0 refills | Status: DC | PRN

## 2019-04-30 ENCOUNTER — Other Ambulatory Visit: Payer: Self-pay | Admitting: Hematology and Oncology

## 2019-04-30 DIAGNOSIS — C50111 Malignant neoplasm of central portion of right female breast: Secondary | ICD-10-CM

## 2019-04-30 MED ORDER — OXYCODONE-ACETAMINOPHEN 5-325 MG PO TABS: 1.0000 | ORAL_TABLET | ORAL | 0 refills | Status: DC | PRN

## 2019-05-02 ENCOUNTER — Inpatient Hospital Stay: Payer: Medicare Other | Attending: Adult Health | Admitting: Adult Health

## 2019-05-02 NOTE — Progress Notes (Deleted)
CLINIC:  Survivorship   REASON FOR VISIT:  Routine follow-up for history of breast cancer.   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%; sentinel lymph node sampling 03/10/2013 showed1/2 SLN pos PR 3% PR 6% Ki-67 15% HER-2 negative   02/10/2013 Breast MRI   Right breast: Rim enhancing necrotic mass 3.4 cm with a 2 cm level I axillary lymph node   03/17/2013 - 08/25/2013 Neo-Adjuvant Chemotherapy   Dose dense Adriamycin and Cytoxan Followed by weekly Taxol and carboplatin x12; chemotherapy-induced anemia and peripheral neuropathy were the complications   01/01/1790 Surgery   Right breast lumpectomy scatter residual invasive mass 1.1 cm with [facility] 0/11 lymph nodes no cancer seen: ER 0% PR 0% insufficient to mark it on HER-2 testing   11/12/2013 - 12/24/2013 Radiation Therapy   Radiation therapy to the breast and axilla      INTERVAL HISTORY:  Tabitha Ramirez presents to the [facility] today for routine follow-up for her history of breast cancer.  Overall, she reports feeling quite well. ***    REVIEW OF SYSTEMS:  Review of Systems - Oncology Breast: Denies any new nodularity, masses, tenderness, nipple changes, or nipple discharge.       PAST MEDICAL/SURGICAL HISTORY:  Past Medical History:  Diagnosis Date  . Allergy   . Anemia   . Arthritis    "joints" (09/29/2013)  . Breast cancer ([place])   . GERD (gastroesophageal reflux disease)   . Heart murmur   . History of blood transfusion    "related to chemo/breast cancer" (09/29/2013)  . Hypertension   . Migraines   . Personal history of chemotherapy   . Personal history  of radiation therapy   . Radiation 11/05/13-12/24/13   Right breast   . TMJ (temporomandibular joint syndrome)    "left; just dx'd" (09/29/2013   Past Surgical History:  Procedure Laterality Date  . AXILLARY LYMPH NODE BIOPSY Right 03/10/2013   Procedure: AXILLARY LYMPH NODE BIOPSY;  Surgeon: Adin Hector, MD;  Location: [place];  Service: General;  Laterality: Right;  sentinel node with blue dye  . BREAST BIOPSY    . BREAST LUMPECTOMY Right 09/29/2013   needle localization w/axillary LND/notes 09/29/2013  . BREAST LUMPECTOMY WITH NEEDLE LOCALIZATION AND AXILLARY LYMPH NODE DISSECTION Right 09/29/2013   Procedure: RIGHT BREAST NEEDLE LOCALIZED LUMPECTOMY AND AXILLARY LYMPH NODE DISSECTION;  Surgeon: Adin Hector, MD;  Location: [facility];  Service: General;  Laterality: Right;  . COLONOSCOPY N/A 04/30/2013   Procedure: COLONOSCOPY;  Surgeon: Wonda Horner, MD;  Location: [facility];  Service: Endoscopy;  Laterality: N/A;  . ESOPHAGOGASTRODUODENOSCOPY N/A 04/28/2013   Procedure: ESOPHAGOGASTRODUODENOSCOPY (EGD);  Surgeon: Wonda Horner, MD;  Location: [facility];  Service: Endoscopy;  Laterality: N/A;  . MASTECTOMY Right 09/29/2013   "partial"  . PORT-A-CATH REMOVAL  09/29/2013  . PORT-A-CATH REMOVAL Left 09/29/2013   Procedure: REMOVAL PORT-A-CATH;  Surgeon: Adin Hector, MD;  Location: [place];  Service: General;  Laterality: Left;  . PORTACATH PLACEMENT N/A 03/10/2013   Procedure: INSERTION PORT-A-CATH WITH FLUOROSCOPY AND ULTRASOUND;  Surgeon: Adin Hector, MD;  Location: [facility];  Service: General;  Laterality: N/A;  . SURAL NERVE BX Right 05/13/2014   Procedure: SURAL NERVE BIOPSY;  Surgeon: Hosie Spangle, MD;  Location: [facility];  Service: Neurosurgery;  Laterality: Right;  Right sural nerve biopsy  . TONSILLECTOMY    . TOTAL ABDOMINAL HYSTERECTOMY     With bilateral salpingo-oophorectomy     ALLERGIES:  Allergies  Allergen Reactions  . Penicillins Rash     CURRENT  MEDICATIONS:  Outpatient Encounter Medications as of 05/02/2019  Medication Sig  . acetaminophen (TYLENOL) 500 MG tablet Take 1,000 mg by mouth every 6 (six) hours as needed for mild pain.  Marland Kitchen clopidogrel (PLAVIX) 75 MG tablet Take 1 tablet (75 mg total) by mouth daily.  Marland Kitchen levothyroxine (SYNTHROID, LEVOTHROID) 75 MCG tablet Take 75 mcg by mouth daily before breakfast.  . LORazepam (ATIVAN) 0.5 MG tablet Take 1 tablet (0.5 mg total) by mouth at bedtime as needed.  . ondansetron (ZOFRAN) 8 MG tablet TAKE 1 TABLET (8 MG TOTAL) BY MOUTH EVERY 12 (TWELVE) HOURS AS NEEDED FOR NAUSEA.  Marland Kitchen OVER THE COUNTER MEDICATION Take 15 mLs by mouth daily. Reported on 12/01/2015  . oxyCODONE-acetaminophen (PERCOCET/ROXICET) 5-325 MG tablet Take 1 tablet by mouth every 4 (four) hours as needed for severe pain.  . traZODone (DESYREL) 100 MG tablet Take 1 tablet (100 mg total) by mouth at bedtime.  . triamterene-hydrochlorothiazide (MAXZIDE-25) 37.5-25 MG per tablet Take 0.5 tablets by mouth every other day.  . vitamin B-12 (CYANOCOBALAMIN) 1000 MCG tablet Take 1 tablet (1,000 mcg total) by mouth daily.   No facility-administered encounter medications on file as of 05/02/2019.      ONCOLOGIC FAMILY HISTORY:  Family History  Problem Relation Age of Onset  . Cancer Mother 77       Unknown type of cancer  . Congestive Heart Failure Mother   . Breast cancer Mother   . Colon cancer Brother   . Diabetes Brother 22  . Heart disease Brother        AMI 05/22/2012  . Congestive Heart Failure Brother   . Cancer Maternal Grandmother 55       Unknown type of cancer  . Breast cancer Maternal Grandmother     GENETIC COUNSELING/TESTING: ***  SOCIAL HISTORY:  Tabitha Ramirez is /single/married/divorced/widowed/separated and lives alone/with her spouse/family/friend in (city), [place].  She has (#) children and they live in (city).  Tabitha Ramirez is currently retired/disabled/working part-time/full-time as ***.  She denies  any current or history of tobacco, alcohol, or illicit drug use.     PHYSICAL EXAMINATION:  Vital Signs: There were no vitals filed for this visit. There were no vitals filed for this visit. General: Well-nourished, well-appearing female in no acute distress.  Unaccompanied/Accompanied by***** today.   HEENT: Head is normocephalic.  Pupils equal and reactive to light. Conjunctivae clear without exudate.  Sclerae anicteric. Oral mucosa is pink, moist.  Oropharynx is pink without lesions or erythema.  Lymph: No cervical, supraclavicular, or infraclavicular lymphadenopathy noted on palpation.  Cardiovascular: Regular rate and rhythm.Marland Kitchen Respiratory: Clear to auscultation bilaterally. Chest expansion symmetric; breathing non-labored.  Breast Exam:  -Left breast: No appreciable masses on palpation. No skin redness, thickening, or peau d'orange appearance; no nipple retraction or nipple discharge; mild distortion in symmetry at previous lumpectomy site***healed scar without erythema or nodularity.  -Right breast: No appreciable masses on palpation. No skin redness, thickening, or peau d'orange appearance; no nipple retraction or nipple discharge; mild distortion in symmetry at previous lumpectomy site***healed  scar without erythema or nodularity. -Axilla: No axillary adenopathy bilaterally.  GI: Abdomen soft and round; non-tender, non-distended. Bowel sounds normoactive. No hepatosplenomegaly.   GU: Deferred.  Neuro: No focal deficits. Steady gait.  Psych: Mood and affect normal and appropriate for situation.  MSK: No focal spinal tenderness to palpation, full range of motion in bilateral upper extremities Extremities: No edema. Skin: Warm and dry.  LABORATORY DATA:  None for this visit***   DIAGNOSTIC IMAGING:  Most recent mammogram: ***    ASSESSMENT AND PLAN:  Ms.. Kofman is a pleasant 73 y.o. female with history of Stage *** right/left breast invasive ductal carcinoma, ER+/PR+/HER2-,  diagnosed in (date), treated with lumpectomy, adjuvant radiation therapy, and anti-estrogen therapy with *** beginning in (date).  She presents to the [facility] for surveillance and routine follow-up.   1. History of breast cancer:  Ms. Langner is currently clinically and radiographically without evidence of disease or recurrence of breast cancer. She will be due for mammogram in ***; orders placed today.  She will continue her anti-estrogen therapy with ***, with plans to continue for *** years.  She will return to the cancer center to see her medical oncologist, Dr. ***, in ***/2018.  I encouraged her to call me with any questions or concerns before her next visit at the cancer center, and I would be happy to see her sooner, if needed.    #. Problem(s) at Visit___________________.  #. Bone health:  Given Ms. 18 age, history of breast cancer, and her current anti-estrogen therapy with ________, she is at risk for bone demineralization. Her last DEXA scan was on **/**/20**.  In the meantime, she was encouraged to increase her consumption of foods rich in calcium, as well as increase her weight-bearing activities.  She was given education on specific food and activities to promote bone health.  #. Cancer screening:  Due to Ms. Milillo's history and her age, she should receive screening for skin cancers, colon cancer, and ***gynecologic cancers. She was encouraged to follow-up with her PCP for appropriate cancer screenings.   #. Health maintenance and wellness promotion: Ms. Christenson was encouraged to consume 5-7 servings of fruits and vegetables per day. She was also encouraged to engage in moderate to vigorous exercise for 30 minutes per day most days of the week. She was instructed to limit her alcohol consumption and continue to abstain from tobacco use/was encouraged stop smoking.  ***    Dispo:  -Return to cancer center ***   A total of (30) minutes of face-to-face time was spent with  this patient with greater than 50% of that time in counseling and care-coordination.   Tabitha Phlegm, Tabitha Ramirez [facility] [phone]   Note: PRIMARY CARE PROVIDER Elaina Pattee, [place] [phone]

## 2019-05-06 ENCOUNTER — Telehealth: Payer: Self-pay | Admitting: Hematology and Oncology

## 2019-05-06 NOTE — Telephone Encounter (Signed)
Returned patient's phone call regarding scheduling a yearly follow up appointment, per patient's request appointment has been scheduled to be on 09/04.

## 2019-05-12 ENCOUNTER — Telehealth: Payer: Self-pay | Admitting: Hematology and Oncology

## 2019-05-12 NOTE — Telephone Encounter (Signed)
Called pt per 8/17 sch message - unable to reach pt . Left message for patient to call back to reschedule appt.

## 2019-05-27 ENCOUNTER — Other Ambulatory Visit: Payer: Self-pay | Admitting: Hematology and Oncology

## 2019-05-27 DIAGNOSIS — Z171 Estrogen receptor negative status [ER-]: Secondary | ICD-10-CM

## 2019-05-27 DIAGNOSIS — C50111 Malignant neoplasm of central portion of right female breast: Secondary | ICD-10-CM

## 2019-05-27 MED ORDER — OXYCODONE-ACETAMINOPHEN 5-325 MG PO TABS: 1.0000 | ORAL_TABLET | ORAL | 0 refills | Status: DC | PRN

## 2019-05-30 ENCOUNTER — Ambulatory Visit: Payer: Medicare Other | Admitting: Hematology and Oncology

## 2019-06-05 ENCOUNTER — Telehealth: Payer: Self-pay | Admitting: Hematology and Oncology

## 2019-06-05 NOTE — Progress Notes (Signed)
I called her couple of times and left message. She can be rescheduled.

## 2019-06-05 NOTE — Telephone Encounter (Signed)
Left message to verify telephone visit for pre reg °

## 2019-06-06 ENCOUNTER — Inpatient Hospital Stay: Payer: Medicare Other | Admitting: Hematology and Oncology

## 2019-06-06 ENCOUNTER — Other Ambulatory Visit: Payer: Self-pay

## 2019-06-06 ENCOUNTER — Telehealth: Payer: Self-pay | Admitting: Hematology and Oncology

## 2019-06-06 NOTE — Progress Notes (Signed)
This encounter was created in error - please disregard.

## 2019-06-06 NOTE — Telephone Encounter (Signed)
Contacted patient to verify telephone visit for pre reg °

## 2019-06-06 NOTE — Assessment & Plan Note (Signed)
1. 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 N0 M0 ER PR 0% HER-2 negative status post radiation therapy  2. Severe peripheral neuropathy: Currently on gabapentin, sees neurology sural nerve biopsy was performed by them. Slight improvement in her symptoms.currently takes pain medications. She wants to cut down the gabapentin to once a day at bedtime. I renewed her pain prescription today. I discussed the role of acupuncture as well asTENS unit along with applying menthol to her hands and feet.  Breast Cancer Surveillance: 1. Breast exam 04/28/18 : Normal 2. Mammogram needs to be done this year 3. Bone density 03/26/2015: T score -1.9 osteopenia: Currently on calcium and vitamin D 4. Radiation pneumonitis: chest CT scan 03/30/2015 showed right upper lung scarring. Patient does not have any cough or shortness of breath.  April 2019 at [facility]: Hospitalization for pneumonia and lower extremity paralysis: Spent 1 month in rehab and is doing much better. Return to clinic in 1 yearfor follow-up with long-term survivorship. 

## 2019-06-08 NOTE — Progress Notes (Signed)
HEMATOLOGY-ONCOLOGY TELEPHONE VISIT PROGRESS NOTE  I connected with Tabitha Ramirez on 06/09/2019 at 10:45 AM EDT by telephone and verified that I am speaking with the correct person using two identifiers.  I discussed the limitations, risks, security and privacy concerns of performing an evaluation and management service by telephone and the availability of in person appointments.  I also discussed with the patient that there may be a patient responsible charge related to this service. The patient expressed understanding and agreed to proceed.   History of Present Illness: Tabitha Ramirez is a 73 y.o. female with above-mentioned history of right breast cancer treated with neoadjuvant chemotherapy, lumpectomy, radiation, and who is currently on surveillance. I last saw her a year ago. Mammogram on 05/24/18 showed no evidence of malignancy bilaterally. She presents over the phone today for annual follow-up.   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%; sentinel lymph node sampling 03/10/2013 showed1/2 SLN pos PR 3% PR 6% Ki-67 15% HER-2 negative   02/10/2013 Breast MRI   Right breast: Rim enhancing necrotic mass 3.4 cm with a 2 cm level I axillary lymph node   03/17/2013 - 08/25/2013 Neo-Adjuvant Chemotherapy   Dose dense Adriamycin and Cytoxan Followed by weekly Taxol and carboplatin x12; chemotherapy-induced anemia and peripheral neuropathy were the complications   05/27/3299 Surgery   Right breast lumpectomy scatter residual invasive mass 1.1 cm with [facility] 0/11 lymph nodes no cancer seen: ER 0% PR 0% insufficient to mark it on HER-2 testing   11/12/2013 - 12/24/2013  Radiation Therapy   Radiation therapy to the breast and axilla     Observations/Objective:  No clinical evidence of relapse.   Assessment Plan:  Cancer of central portion of right female breast ([place]) 1. 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 N0 M0 ER PR 0% HER-2 negative status post radiation therapy  2. Severe peripheral neuropathy: Currently on gabapentin, sees neurology sural nerve biopsy was performed by them. Slight improvement in her symptoms.currently takes pain medications. She discontinued gabapentin. Tried acupuncture but it didn't work.  Breast Cancer Surveillance: 1. Breast exam 04/28/18 : Normal 2. Mammogram 05/24/18: patient will schedule 3. Bone density 03/26/2015: T score -1.9 osteopenia: Currently on calcium and vitamin D 4. Radiation pneumonitis: chest CT scan 03/30/2015 showed right upper lung scarring. Patient does not have any cough or shortness of breath.  Chronic pain related to the breast surgery and neuropathy: I discussed with her about reducing the frequency of taking the medication.  She will start to take it every 6 hours as needed and she was supplemented with Tylenol as needed. Our goal is to taper her pain medication over time and discontinue it if possible.  April 2019 at [facility]: Hospitalization for pneumonia and lower extremity paralysis: Spent 1 month in rehab and is doing much better.  Return to clinic in 1 yearfor follow-up with long-term survivorship.  I discussed the assessment and treatment plan with the patient. The patient was provided an opportunity to ask questions and all were answered. The patient agreed with the plan  and demonstrated an understanding of the instructions. The patient was advised to call back or seek an in-person evaluation if the symptoms worsen or if the condition  fails to improve as anticipated.   I provided 15 minutes of non-face-to-face time during this encounter.   Rulon Eisenmenger, MD 06/09/2019    I, Molly Dorshimer, am acting as scribe for Nicholas Lose, MD.  I have reviewed the above documentation for accuracy and completeness, and I agree with the above.

## 2019-06-09 ENCOUNTER — Inpatient Hospital Stay: Payer: Medicare Other | Attending: Adult Health | Admitting: Hematology and Oncology

## 2019-06-09 DIAGNOSIS — C50111 Malignant neoplasm of central portion of right female breast: Secondary | ICD-10-CM | POA: Diagnosis not present

## 2019-06-09 DIAGNOSIS — Z171 Estrogen receptor negative status [ER-]: Secondary | ICD-10-CM

## 2019-06-09 NOTE — Assessment & Plan Note (Signed)
1. 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 N0 M0 ER PR 0% HER-2 negative status post radiation therapy  2. Severe peripheral neuropathy: Currently on gabapentin, sees neurology sural nerve biopsy was performed by them. Slight improvement in her symptoms.currently takes pain medications. She wants to cut down the gabapentin to once a day at bedtime. I renewed her pain prescription today. I discussed the role of acupuncture as well asTENS unit along with applying menthol to her hands and feet.  Breast Cancer Surveillance: 1. Breast exam 04/28/18 : Normal 2. Mammogram needs to be done this year 3. Bone density 03/26/2015: T score -1.9 osteopenia: Currently on calcium and vitamin D 4. Radiation pneumonitis: chest CT scan 03/30/2015 showed right upper lung scarring. Patient does not have any cough or shortness of breath.  April 2019 at [facility]: Hospitalization for pneumonia and lower extremity paralysis: Spent 1 month in rehab and is doing much better. Return to clinic in 1 yearfor follow-up with long-term survivorship.

## 2019-06-10 ENCOUNTER — Telehealth: Payer: Self-pay | Admitting: Hematology and Oncology

## 2019-06-10 NOTE — Telephone Encounter (Signed)
I talk with patient regarding schedule  

## 2019-06-26 ENCOUNTER — Other Ambulatory Visit: Payer: Self-pay | Admitting: Hematology and Oncology

## 2019-06-26 DIAGNOSIS — Z171 Estrogen receptor negative status [ER-]: Secondary | ICD-10-CM

## 2019-06-26 DIAGNOSIS — C50111 Malignant neoplasm of central portion of right female breast: Secondary | ICD-10-CM

## 2019-06-26 MED ORDER — OXYCODONE-ACETAMINOPHEN 5-325 MG PO TABS: 1.0000 | ORAL_TABLET | ORAL | 0 refills | Status: DC | PRN

## 2019-07-11 ENCOUNTER — Other Ambulatory Visit: Payer: Self-pay | Admitting: Hematology and Oncology

## 2019-07-28 ENCOUNTER — Other Ambulatory Visit: Payer: Self-pay | Admitting: Hematology and Oncology

## 2019-07-28 DIAGNOSIS — Z171 Estrogen receptor negative status [ER-]: Secondary | ICD-10-CM

## 2019-07-28 DIAGNOSIS — C50111 Malignant neoplasm of central portion of right female breast: Secondary | ICD-10-CM

## 2019-07-29 ENCOUNTER — Other Ambulatory Visit: Payer: Self-pay | Admitting: Hematology and Oncology

## 2019-07-29 DIAGNOSIS — C50111 Malignant neoplasm of central portion of right female breast: Secondary | ICD-10-CM

## 2019-07-29 MED ORDER — OXYCODONE-ACETAMINOPHEN 5-325 MG PO TABS: 1.0000 | ORAL_TABLET | ORAL | 0 refills | Status: DC | PRN

## 2019-08-12 ENCOUNTER — Other Ambulatory Visit: Payer: Self-pay | Admitting: Hematology and Oncology

## 2019-08-13 ENCOUNTER — Other Ambulatory Visit: Payer: Self-pay | Admitting: Hematology and Oncology

## 2019-08-13 MED ORDER — LORAZEPAM 0.5 MG PO TABS: 0.5000 mg | ORAL_TABLET | Freq: Every evening | ORAL | 3 refills | Status: DC | PRN

## 2019-08-26 ENCOUNTER — Other Ambulatory Visit: Payer: Self-pay | Admitting: Hematology and Oncology

## 2019-08-26 DIAGNOSIS — C50111 Malignant neoplasm of central portion of right female breast: Secondary | ICD-10-CM

## 2019-08-26 DIAGNOSIS — Z171 Estrogen receptor negative status [ER-]: Secondary | ICD-10-CM

## 2019-08-26 MED ORDER — OXYCODONE-ACETAMINOPHEN 5-325 MG PO TABS: 1.0000 | ORAL_TABLET | ORAL | 0 refills | Status: DC | PRN

## 2019-09-22 ENCOUNTER — Other Ambulatory Visit: Payer: Self-pay | Admitting: Hematology and Oncology

## 2019-09-22 DIAGNOSIS — Z171 Estrogen receptor negative status [ER-]: Secondary | ICD-10-CM

## 2019-09-22 DIAGNOSIS — C50111 Malignant neoplasm of central portion of right female breast: Secondary | ICD-10-CM

## 2019-09-22 MED ORDER — OXYCODONE-ACETAMINOPHEN 5-325 MG PO TABS: 1.0000 | ORAL_TABLET | ORAL | 0 refills | Status: DC | PRN

## 2019-10-23 ENCOUNTER — Other Ambulatory Visit: Payer: Self-pay | Admitting: Hematology and Oncology

## 2019-10-23 DIAGNOSIS — C50111 Malignant neoplasm of central portion of right female breast: Secondary | ICD-10-CM

## 2019-10-23 MED ORDER — OXYCODONE-ACETAMINOPHEN 5-325 MG PO TABS: 1.0000 | ORAL_TABLET | ORAL | 0 refills | Status: DC | PRN

## 2019-11-18 ENCOUNTER — Other Ambulatory Visit: Payer: Self-pay | Admitting: Hematology and Oncology

## 2019-11-18 DIAGNOSIS — C50111 Malignant neoplasm of central portion of right female breast: Secondary | ICD-10-CM

## 2019-11-18 MED ORDER — OXYCODONE-ACETAMINOPHEN 5-325 MG PO TABS: 1.0000 | ORAL_TABLET | ORAL | 0 refills | Status: DC | PRN

## 2019-12-15 ENCOUNTER — Other Ambulatory Visit: Payer: Self-pay | Admitting: Hematology and Oncology

## 2019-12-15 DIAGNOSIS — C50111 Malignant neoplasm of central portion of right female breast: Secondary | ICD-10-CM

## 2019-12-15 DIAGNOSIS — Z171 Estrogen receptor negative status [ER-]: Secondary | ICD-10-CM

## 2019-12-15 MED ORDER — OXYCODONE-ACETAMINOPHEN 5-325 MG PO TABS: 1.0000 | ORAL_TABLET | ORAL | 0 refills | Status: DC | PRN

## 2020-01-15 ENCOUNTER — Other Ambulatory Visit: Payer: Self-pay | Admitting: Hematology and Oncology

## 2020-01-15 DIAGNOSIS — C50111 Malignant neoplasm of central portion of right female breast: Secondary | ICD-10-CM

## 2020-01-15 MED ORDER — OXYCODONE-ACETAMINOPHEN 5-325 MG PO TABS: 1.0000 | ORAL_TABLET | ORAL | 0 refills | Status: DC | PRN

## 2020-02-13 ENCOUNTER — Other Ambulatory Visit: Payer: Self-pay | Admitting: Hematology and Oncology

## 2020-02-13 DIAGNOSIS — C50111 Malignant neoplasm of central portion of right female breast: Secondary | ICD-10-CM

## 2020-02-13 MED ORDER — OXYCODONE-ACETAMINOPHEN 5-325 MG PO TABS: 1.0000 | ORAL_TABLET | ORAL | 0 refills | Status: DC | PRN

## 2020-03-15 ENCOUNTER — Other Ambulatory Visit: Payer: Self-pay | Admitting: Hematology and Oncology

## 2020-03-15 DIAGNOSIS — C50111 Malignant neoplasm of central portion of right female breast: Secondary | ICD-10-CM

## 2020-03-15 DIAGNOSIS — Z171 Estrogen receptor negative status [ER-]: Secondary | ICD-10-CM

## 2020-03-15 MED ORDER — OXYCODONE-ACETAMINOPHEN 5-325 MG PO TABS: 1.0000 | ORAL_TABLET | ORAL | 0 refills | Status: DC | PRN

## 2020-03-31 ENCOUNTER — Other Ambulatory Visit: Payer: Self-pay | Admitting: Hematology and Oncology

## 2020-04-13 ENCOUNTER — Other Ambulatory Visit: Payer: Self-pay | Admitting: Hematology and Oncology

## 2020-04-13 DIAGNOSIS — C50111 Malignant neoplasm of central portion of right female breast: Secondary | ICD-10-CM

## 2020-04-13 MED ORDER — OXYCODONE-ACETAMINOPHEN 5-325 MG PO TABS: 1.0000 | ORAL_TABLET | ORAL | 0 refills | Status: DC | PRN

## 2020-05-14 ENCOUNTER — Other Ambulatory Visit: Payer: Self-pay | Admitting: Hematology and Oncology

## 2020-05-14 DIAGNOSIS — Z171 Estrogen receptor negative status [ER-]: Secondary | ICD-10-CM

## 2020-05-14 DIAGNOSIS — C50111 Malignant neoplasm of central portion of right female breast: Secondary | ICD-10-CM

## 2020-05-14 MED ORDER — OXYCODONE-ACETAMINOPHEN 5-325 MG PO TABS: 1.0000 | ORAL_TABLET | ORAL | 0 refills | Status: DC | PRN

## 2020-06-09 ENCOUNTER — Ambulatory Visit: Payer: Medicare Other | Admitting: Hematology and Oncology

## 2020-06-10 ENCOUNTER — Other Ambulatory Visit: Payer: Self-pay | Admitting: Hematology and Oncology

## 2020-06-10 DIAGNOSIS — C50111 Malignant neoplasm of central portion of right female breast: Secondary | ICD-10-CM

## 2020-06-10 MED ORDER — OXYCODONE-ACETAMINOPHEN 5-325 MG PO TABS: 1.0000 | ORAL_TABLET | ORAL | 0 refills | Status: DC | PRN

## 2020-07-07 ENCOUNTER — Other Ambulatory Visit: Payer: Self-pay | Admitting: Hematology and Oncology

## 2020-07-07 DIAGNOSIS — Z171 Estrogen receptor negative status [ER-]: Secondary | ICD-10-CM

## 2020-07-07 DIAGNOSIS — C50111 Malignant neoplasm of central portion of right female breast: Secondary | ICD-10-CM

## 2020-07-07 MED ORDER — OXYCODONE-ACETAMINOPHEN 5-325 MG PO TABS: 1.0000 | ORAL_TABLET | ORAL | 0 refills | Status: DC | PRN

## 2020-08-02 ENCOUNTER — Other Ambulatory Visit: Payer: Self-pay

## 2020-08-02 ENCOUNTER — Telehealth: Payer: Self-pay | Admitting: Hematology and Oncology

## 2020-08-02 ENCOUNTER — Inpatient Hospital Stay: Payer: Medicare PPO | Attending: Hematology and Oncology | Admitting: Hematology and Oncology

## 2020-08-02 DIAGNOSIS — Z79899 Other long term (current) drug therapy: Secondary | ICD-10-CM | POA: Diagnosis not present

## 2020-08-02 DIAGNOSIS — Z923 Personal history of irradiation: Secondary | ICD-10-CM | POA: Insufficient documentation

## 2020-08-02 DIAGNOSIS — M2550 Pain in unspecified joint: Secondary | ICD-10-CM | POA: Diagnosis not present

## 2020-08-02 DIAGNOSIS — C50111 Malignant neoplasm of central portion of right female breast: Secondary | ICD-10-CM | POA: Diagnosis not present

## 2020-08-02 DIAGNOSIS — G629 Polyneuropathy, unspecified: Secondary | ICD-10-CM | POA: Diagnosis not present

## 2020-08-02 DIAGNOSIS — G8929 Other chronic pain: Secondary | ICD-10-CM | POA: Insufficient documentation

## 2020-08-02 DIAGNOSIS — Z171 Estrogen receptor negative status [ER-]: Secondary | ICD-10-CM | POA: Diagnosis not present

## 2020-08-02 MED ORDER — OXYCODONE-ACETAMINOPHEN 5-325 MG PO TABS: 1.0000 | ORAL_TABLET | ORAL | 0 refills | Status: DC | PRN

## 2020-08-02 NOTE — Telephone Encounter (Signed)
Scheduled appointment per 11/8 los. Spoke to patient who is aware of appointment date and time.Gave patient calendar print out.

## 2020-08-02 NOTE — Assessment & Plan Note (Signed)
1. 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 N0 M0 ER PR 0% HER-2 negative status post radiation therapy  2. Severe peripheral neuropathy: Currently on gabapentin, sees neurology sural nerve biopsy was performed by them. Slightimprovement in her symptoms.currently takes pain medications. She discontinued gabapentin. Tried acupuncture but it didn't work.  Breast Cancer Surveillance: 1. Breast exam 08/02/2020: Benign 2. Mammogram 05/24/18: She needs to have new mammograms done urgently. 3. Bone density 03/26/2015: T score -1.9 osteopenia: Currently on calcium and vitamin D 4. Radiation pneumonitis: chest CT scan 03/30/2015 showed right upper lung scarring. Patient does not have any cough or shortness of breath.  Chronic pain related to the breast surgery and neuropathy: I discussed with her about reducing the frequency of taking the medication   April 2019 at [facility]: Hospitalization for pneumonia and lower extremity paralysis: Spent 1 month in rehab and is doing much better.  Return to clinic in 1 yearfor follow-up.

## 2020-08-02 NOTE — Progress Notes (Signed)
Patient Care Team: Elaina Pattee, MD as PCP - General (Family Medicine) Magrinat, Virgie Dad, MD as Consulting Physician (Oncology)  DIAGNOSIS:    ICD-10-CM   1. 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 Ki-67 84%; sentinel lymph node sampling 03/10/2013 showed1/2 SLN pos PR 3% PR 6% Ki-67 15% HER-2 negative   02/10/2013 Breast MRI   Right breast: Rim enhancing necrotic mass 3.4 cm with a 2 cm level I axillary lymph node   03/17/2013 - 08/25/2013 Neo-Adjuvant Chemotherapy   Dose dense Adriamycin and Cytoxan Followed by weekly Taxol and carboplatin x12; chemotherapy-induced anemia and peripheral neuropathy were the complications   01/30/997 Surgery   Right breast lumpectomy scatter residual invasive mass 1.1 cm with [facility] 0/11 lymph nodes no cancer seen: ER 0% PR 0% insufficient to mark it on HER-2 testing   11/12/2013 - 12/24/2013 Radiation Therapy   Radiation therapy to the breast and axilla     CHIEF COMPLIANT: Follow-up of right breast cancer   INTERVAL HISTORY: Tabitha Ramirez is a 74 y.o. with above-mentioned history of right breast cancer treated with neoadjuvant chemotherapy, lumpectomy, radiation, and who is currently on surveillance. She presents to the clinic today for annual follow-up.   She continues to have bilateral chest wall pains that fluctuate on and off.  She also has grade 2-3 peripheral neuropathy however she has been some progress and she is able to drive which is giving her more ability to move  around.  She continues to feel weak and decreased appetite and had lost some 10 pounds since last year.  ALLERGIES:  is allergic to penicillins.  MEDICATIONS:  Current Outpatient Medications  Medication Sig Dispense Refill  . acetaminophen (TYLENOL) 500 MG tablet Take 1,000 mg by mouth every 6 (six) hours as needed for mild pain.    Marland Kitchen clopidogrel (PLAVIX) 75 MG tablet Take 1 tablet (75 mg total) by mouth daily.    Marland Kitchen levothyroxine (SYNTHROID, LEVOTHROID) 75 MCG tablet Take 75 mcg by mouth daily before breakfast.    . LORazepam (ATIVAN) 0.5 MG tablet TAKE 1 TABLET (0.5 MG TOTAL) BY MOUTH AT BEDTIME AS NEEDED. 30 tablet 3  . ondansetron (ZOFRAN) 8 MG tablet TAKE 1 TABLET (8 MG TOTAL) BY MOUTH EVERY 12 (TWELVE) HOURS AS NEEDED FOR NAUSEA. 20 tablet 0  . OVER THE COUNTER MEDICATION Take 15 mLs by mouth daily. Reported on 12/01/2015    . oxyCODONE-acetaminophen (PERCOCET/ROXICET) 5-325 MG tablet Take 1 tablet by mouth every 4 (four) hours as needed for severe pain. 120 tablet 0  . traZODone (DESYREL) 100 MG tablet Take 1 tablet (100 mg total) by mouth at bedtime.    . triamterene-hydrochlorothiazide (MAXZIDE-25) 37.5-25 MG per tablet Take 0.5 tablets by mouth every other day. 15 tablet 0  . vitamin B-12 (CYANOCOBALAMIN) 1000 MCG tablet Take 1 tablet (1,000 mcg total) by mouth daily.     No current facility-administered medications for this visit.    PHYSICAL EXAMINATION: ECOG PERFORMANCE STATUS: 1 - Symptomatic but completely ambulatory  Vitals:   08/02/20 1039  BP: 128/61  Pulse: 97  Resp: 17  Temp: 98.3 F (36.8 C)  SpO2: 98%   Filed Weights   08/02/20 1039  Weight: 132 lb 4.8 oz (60 kg)    BREAST: No palpable masses or nodules in either right or left breasts. No palpable axillary supraclavicular or infraclavicular adenopathy no breast tenderness or nipple discharge. (exam performed in the presence of a chaperone)  LABORATORY DATA:  I have reviewed the data as listed CMP Latest  Ref Rng & Units 03/30/2015 06/22/2014 05/12/2014  Glucose 70 - 140 mg/dl 103 119 107(H)  BUN 7.0 - 26.0 mg/dL 18.9 17.2 18  Creatinine 0.6 - 1.1 mg/dL 0.9 1.0 0.82  Sodium 136 - 145 mEq/L 140 143 140  Potassium 3.5 - 5.1 mEq/L 3.8 3.3(L) 3.4(L)  Chloride 96 - 112 mEq/L - - 100  CO2 22 - 29 mEq/L _0 Calcium 8.4 - 10.4 mg/dL 9.7 9.7 9.7  Total Protein 6.4 - 8.3 g/dL 7.9 9.0(H) -  Total Bilirubin 0.20 - 1.20 mg/dL 0.45 0.45 -  Alkaline Phos 40 - 150 U/L 100 92 -  AST 5 - 34 U/L 20 26 -  ALT 0 - 55 U/L 19 21 -    Lab Results  Component Value Date   WBC 6.1 06/22/2014   HGB 10.7 (L) 06/22/2014   HCT 32.5 (L) 06/22/2014   MCV 102.2 (H) 06/22/2014   PLT 284 06/22/2014   NEUTROABS 2.8 06/22/2014    ASSESSMENT & PLAN:  Cancer of central portion of right female breast ([place]) 1. 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 N0 M0 ER PR 0% HER-2 negative status post radiation therapy  2. Severe peripheral neuropathy: Currently on gabapentin, sees neurology sural nerve biopsy was performed by them. Slightimprovement in her symptoms.currently takes pain medications. She discontinued gabapentin. Tried acupuncture but it didn't work.  Breast Cancer Surveillance: 1. Breast exam 08/02/2020: Benign 2. Mammogram 05/24/18: She needs to have new mammograms done urgently. 3. Bone density 03/26/2015: T score -1.9 osteopenia: Currently on calcium and vitamin D 4. Radiation pneumonitis: chest CT scan 03/30/2015 showed right upper lung scarring. Patient does not have any cough or shortness of breath.  Chronic pain related to the breast surgery and neuropathy: Continues to require narcotic pain medication.  We will need to continue this regimen.  She has severe joint pains as well as chest wall pains. I renewed her prescription for oxycodone.  She  is not able to decrease the dosage of the frequency of this medication.  April 2019 at [facility]: Hospitalization for pneumonia and lower extremity paralysis: Spent 1 month in rehab and is doing much better.  I once again stressed the importance of getting a mammogram every year.  Her daughter was here today and therefore she will make sure that she gets a mammogram.  Return to clinic in 1 yearfor follow-up.    No orders of the defined types were placed in this encounter.  The patient has a good understanding of the overall plan. she agrees with it. she will call with any problems that may develop before the next visit here.  Total time spent: 20 mins including face to face time and time spent for planning, charting and coordination of care  Nicholas Lose, MD 08/02/2020  I, Cloyde Reams Dorshimer, am acting as Education administrator for  Dr. Nicholas Lose.  I have reviewed the above documentation for accuracy and completeness, and I agree with the above.

## 2020-08-30 ENCOUNTER — Other Ambulatory Visit: Payer: Self-pay | Admitting: Hematology and Oncology

## 2020-08-30 DIAGNOSIS — Z171 Estrogen receptor negative status [ER-]: Secondary | ICD-10-CM

## 2020-08-30 DIAGNOSIS — C50111 Malignant neoplasm of central portion of right female breast: Secondary | ICD-10-CM

## 2020-08-30 MED ORDER — OXYCODONE-ACETAMINOPHEN 5-325 MG PO TABS: 1.0000 | ORAL_TABLET | ORAL | 0 refills | Status: DC | PRN

## 2020-09-06 ENCOUNTER — Other Ambulatory Visit: Payer: Self-pay | Admitting: Hematology and Oncology

## 2020-09-23 ENCOUNTER — Other Ambulatory Visit: Payer: Self-pay

## 2020-09-23 ENCOUNTER — Ambulatory Visit
Admission: RE | Admit: 2020-09-23 | Discharge: 2020-09-23 | Disposition: A | Discharge status: Home / Self Care | Payer: Medicare PPO | Source: Ambulatory Visit | Attending: Hematology and Oncology | Admitting: Hematology and Oncology

## 2020-09-23 DIAGNOSIS — C50111 Malignant neoplasm of central portion of right female breast: Secondary | ICD-10-CM

## 2020-09-27 ENCOUNTER — Other Ambulatory Visit: Payer: Self-pay | Admitting: Hematology and Oncology

## 2020-09-27 DIAGNOSIS — Z171 Estrogen receptor negative status [ER-]: Secondary | ICD-10-CM

## 2020-09-27 DIAGNOSIS — C50111 Malignant neoplasm of central portion of right female breast: Secondary | ICD-10-CM

## 2020-09-27 MED ORDER — OXYCODONE-ACETAMINOPHEN 5-325 MG PO TABS: 1.0000 | ORAL_TABLET | ORAL | 0 refills | Status: DC | PRN

## 2020-10-28 ENCOUNTER — Other Ambulatory Visit: Payer: Self-pay | Admitting: Adult Health

## 2020-10-28 DIAGNOSIS — C50111 Malignant neoplasm of central portion of right female breast: Secondary | ICD-10-CM

## 2020-10-28 MED ORDER — OXYCODONE-ACETAMINOPHEN 5-325 MG PO TABS: 1.0000 | ORAL_TABLET | ORAL | 0 refills | Status: DC | PRN

## 2020-10-28 NOTE — Progress Notes (Signed)
Percocet #120 refilled for patient severe peripheral neuropathy per Dr. Geralyn Flash management plan, in Dr. Geralyn Flash absence.  PMP aware reviewed, no red flags.    Tabitha Bihari, NP

## 2020-11-26 ENCOUNTER — Other Ambulatory Visit: Payer: Self-pay | Admitting: Hematology and Oncology

## 2020-11-26 DIAGNOSIS — Z171 Estrogen receptor negative status [ER-]: Secondary | ICD-10-CM

## 2020-11-26 DIAGNOSIS — C50111 Malignant neoplasm of central portion of right female breast: Secondary | ICD-10-CM

## 2020-11-26 MED ORDER — OXYCODONE-ACETAMINOPHEN 5-325 MG PO TABS: 1.0000 | ORAL_TABLET | ORAL | 0 refills | Status: DC | PRN

## 2020-12-27 ENCOUNTER — Other Ambulatory Visit: Payer: Self-pay | Admitting: Hematology and Oncology

## 2020-12-27 DIAGNOSIS — Z171 Estrogen receptor negative status [ER-]: Secondary | ICD-10-CM

## 2020-12-27 MED ORDER — OXYCODONE-ACETAMINOPHEN 5-325 MG PO TABS
1.0000 | ORAL_TABLET | ORAL | 0 refills | Status: DC | PRN
Start: 2020-12-27 — End: 2021-01-25

## 2021-01-25 ENCOUNTER — Other Ambulatory Visit: Payer: Self-pay | Admitting: Hematology and Oncology

## 2021-01-25 DIAGNOSIS — Z171 Estrogen receptor negative status [ER-]: Secondary | ICD-10-CM

## 2021-01-25 DIAGNOSIS — C50111 Malignant neoplasm of central portion of right female breast: Secondary | ICD-10-CM

## 2021-01-25 MED ORDER — OXYCODONE-ACETAMINOPHEN 5-325 MG PO TABS
1.0000 | ORAL_TABLET | ORAL | 0 refills | Status: DC | PRN
Start: 2021-01-25 — End: 2021-02-24

## 2021-02-24 ENCOUNTER — Other Ambulatory Visit: Payer: Self-pay | Admitting: Hematology and Oncology

## 2021-02-24 DIAGNOSIS — C50111 Malignant neoplasm of central portion of right female breast: Secondary | ICD-10-CM

## 2021-02-24 DIAGNOSIS — Z171 Estrogen receptor negative status [ER-]: Secondary | ICD-10-CM

## 2021-02-24 MED ORDER — OXYCODONE-ACETAMINOPHEN 5-325 MG PO TABS
1.0000 | ORAL_TABLET | ORAL | 0 refills | Status: DC | PRN
Start: 2021-02-24 — End: 2021-03-29

## 2021-03-06 ENCOUNTER — Other Ambulatory Visit: Payer: Self-pay | Admitting: Hematology and Oncology

## 2021-03-07 NOTE — Telephone Encounter (Signed)
Please refill.

## 2021-03-29 ENCOUNTER — Other Ambulatory Visit: Payer: Self-pay | Admitting: Hematology and Oncology

## 2021-03-29 DIAGNOSIS — C50111 Malignant neoplasm of central portion of right female breast: Secondary | ICD-10-CM

## 2021-03-29 MED ORDER — OXYCODONE-ACETAMINOPHEN 5-325 MG PO TABS: 1.0000 | ORAL_TABLET | ORAL | 0 refills | Status: DC | PRN

## 2021-04-26 ENCOUNTER — Other Ambulatory Visit: Payer: Self-pay | Admitting: Hematology and Oncology

## 2021-04-26 DIAGNOSIS — C50111 Malignant neoplasm of central portion of right female breast: Secondary | ICD-10-CM

## 2021-04-26 MED ORDER — OXYCODONE-ACETAMINOPHEN 5-325 MG PO TABS: 1.0000 | ORAL_TABLET | ORAL | 0 refills | Status: DC | PRN

## 2021-05-25 ENCOUNTER — Telehealth: Payer: Self-pay | Admitting: *Deleted

## 2021-05-25 ENCOUNTER — Other Ambulatory Visit: Payer: Self-pay | Admitting: Hematology and Oncology

## 2021-05-25 DIAGNOSIS — C50111 Malignant neoplasm of central portion of right female breast: Secondary | ICD-10-CM

## 2021-05-25 DIAGNOSIS — Z171 Estrogen receptor negative status [ER-]: Secondary | ICD-10-CM

## 2021-05-25 MED ORDER — OXYCODONE-ACETAMINOPHEN 5-325 MG PO TABS
1.0000 | ORAL_TABLET | ORAL | 0 refills | Status: DC | PRN
Start: 2021-05-25 — End: 2021-06-27

## 2021-05-25 NOTE — Telephone Encounter (Signed)
Daughter left message stating Tabitha Ramirez needs a refill of oxycodone.

## 2021-06-27 ENCOUNTER — Other Ambulatory Visit: Payer: Self-pay | Admitting: Hematology and Oncology

## 2021-06-27 DIAGNOSIS — Z171 Estrogen receptor negative status [ER-]: Secondary | ICD-10-CM

## 2021-06-27 DIAGNOSIS — C50111 Malignant neoplasm of central portion of right female breast: Secondary | ICD-10-CM

## 2021-06-27 MED ORDER — OXYCODONE-ACETAMINOPHEN 5-325 MG PO TABS: 1.0000 | ORAL_TABLET | ORAL | 0 refills | Status: DC | PRN

## 2021-07-27 ENCOUNTER — Other Ambulatory Visit: Payer: Self-pay | Admitting: Hematology and Oncology

## 2021-07-27 DIAGNOSIS — C50111 Malignant neoplasm of central portion of right female breast: Secondary | ICD-10-CM

## 2021-07-27 DIAGNOSIS — Z171 Estrogen receptor negative status [ER-]: Secondary | ICD-10-CM

## 2021-07-27 MED ORDER — OXYCODONE-ACETAMINOPHEN 5-325 MG PO TABS: 1.0000 | ORAL_TABLET | ORAL | 0 refills | Status: DC | PRN

## 2021-08-01 NOTE — Progress Notes (Signed)
Patient Care Team: Tabitha Pattee, MD as PCP - General (Family Medicine) Tabitha Ramirez, Tabitha Dad, MD as Consulting Physician (Oncology)  DIAGNOSIS:    ICD-10-CM   1. 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 Ki-67 84%; sentinel lymph node sampling 03/10/2013 showed1/2 SLN pos PR 3% PR 6% Ki-67 15% HER-2 negative   02/10/2013 Breast MRI   Right breast: Rim enhancing necrotic mass 3.4 cm with a 2 cm level I axillary lymph node   03/17/2013 - 08/25/2013 Neo-Adjuvant Chemotherapy   Dose dense Adriamycin and Cytoxan Followed by weekly Taxol and carboplatin x12; chemotherapy-induced anemia and peripheral neuropathy were the complications   03/29/6432 Surgery   Right breast lumpectomy scatter residual invasive mass 1.1 cm with [facility] 0/11 lymph nodes no cancer seen: ER 0% PR 0% insufficient to mark it on HER-2 testing   11/12/2013 - 12/24/2013 Radiation Therapy   Radiation therapy to the breast and axilla     CHIEF COMPLIANT: Follow-up of right breast cancer   INTERVAL HISTORY: Tabitha Ramirez is a 75 y.o. with above-mentioned history of right breast cancer treated with neoadjuvant chemotherapy, lumpectomy, radiation, and who is currently on surveillance. She presents to the clinic today for annual follow-up.  She continues to have soreness in the breast and has been taking pain medications 4 times a day.  ALLERGIES:  is allergic to penicillins.  MEDICATIONS:  Current Outpatient Medications  Medication Sig Dispense Refill    acetaminophen (TYLENOL) 500 MG tablet Take 1,000 mg by mouth every 6 (six) hours as needed for mild pain.     clopidogrel (PLAVIX) 75 MG tablet Take 1 tablet (75 mg total) by mouth daily.     levothyroxine (SYNTHROID, LEVOTHROID) 75 MCG tablet Take 75 mcg by mouth daily before breakfast.     LORazepam (ATIVAN) 0.5 MG tablet TAKE 1 TABLET (0.5 MG TOTAL) BY MOUTH AT BEDTIME AS NEEDED. 30 tablet 3   ondansetron (ZOFRAN) 8 MG tablet TAKE 1 TABLET (8 MG TOTAL) BY MOUTH EVERY 12 (TWELVE) HOURS AS NEEDED FOR NAUSEA. 20 tablet 0   OVER THE COUNTER MEDICATION Take 15 mLs by mouth daily. Reported on 12/01/2015     oxyCODONE-acetaminophen (PERCOCET/ROXICET) 5-325 MG tablet Take 1 tablet by mouth every 4 (four) hours as needed for severe pain. 120 tablet 0   traZODone (DESYREL) 100 MG tablet Take 1 tablet (100 mg total) by mouth at bedtime.     triamterene-hydrochlorothiazide (MAXZIDE-25) 37.5-25 MG per tablet Take 0.5 tablets by mouth every other day. 15 tablet 0   vitamin B-12 (CYANOCOBALAMIN) 1000 MCG tablet Take 1 tablet (1,000 mcg total) by mouth daily.     No current facility-administered medications for this visit.    PHYSICAL EXAMINATION: ECOG PERFORMANCE STATUS: 1 - Symptomatic but completely ambulatory  Vitals:   08/02/21 1023  BP: 126/69  Pulse: 78  Resp: 18  Temp: (!) 97.2 F (36.2 C)  SpO2: 100%   Filed Weights   08/02/21 1023  Weight: 134 lb 12.8 oz (61.1 kg)  BREAST: Tenderness in bilateral breasts. (exam performed in the presence of a chaperone)  LABORATORY DATA:  I have reviewed the data as listed CMP Latest Ref Rng & Units 03/30/2015 06/22/2014 05/12/2014  Glucose 70 - 140 mg/dl 103 119 107(H)  BUN 7.0 - 26.0 mg/dL 18.9 17.2 18  Creatinine 0.6 - 1.1 mg/dL 0.9 1.0 0.82  Sodium 136 - 145 mEq/L 140 143 140  Potassium 3.5 - 5.1 mEq/L 3.8 3.3(L) 3.4(L)  Chloride 96 - 112 mEq/L - - 100  CO2 22 - 29 mEq/L 26 27 27   Calcium 8.4 - 10.4 mg/dL 9.7 9.7 9.7  Total Protein 6.4 -  8.3 g/dL 7.9 9.0(H) -  Total Bilirubin 0.20 - 1.20 mg/dL 0.45 0.45 -  Alkaline Phos 40 - 150 U/L 100 92 -  AST 5 - 34 U/L 20 26 -  ALT 0 - 55 U/L 19 21 -    Lab Results  Component Value Date   WBC 6.1 06/22/2014   HGB 10.7 (L) 06/22/2014   HCT 32.5 (L) 06/22/2014   MCV 102.2 (H) 06/22/2014   PLT 284 06/22/2014   NEUTROABS 2.8 06/22/2014    ASSESSMENT & PLAN:  Cancer of central portion of right female breast ([place]) 1. 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 N0 M0 ER PR 0% HER-2 negative status post radiation therapy   2. Severe peripheral neuropathy: Currently on gabapentin, sees neurology sural nerve biopsy was performed by them. Slight improvement in her symptoms.currently takes pain medications. She discontinued gabapentin. Tried acupuncture but it didn't work.   Breast Cancer Surveillance: 1. Breast exam  08/02/2021: Benign 2. Mammogram 09/23/2020:  Benign breast density category C 3. Bone density 03/26/2015: T score -1.9 osteopenia: Currently on calcium and vitamin D 4. Radiation pneumonitis: chest CT scan 03/30/2015 showed right upper lung scarring. Patient does not have any cough or shortness of breath.   Chronic pain related to the breast surgery and neuropathy: I recommended that we slowly taper off the narcotics.  For the next refill we will give her only 90 tablets and then to 60 and 30 and slowly try to discontinue narcotic therapy.  I instructed her to take Tylenol and Advil or/ibuprofen intermittently between narcotics.   April 2019 at [facility]: Hospitalization for pneumonia and lower extremity paralysis: Spent 1 month in rehab and is doing much better.   Return to clinic in 1 year for follow-up.    No orders of the defined types were placed in this encounter.  The patient has a good understanding of the overall  plan. she agrees with it. she will call with any problems that may develop before the next visit here.  Total time spent: 20 mins including face to face time and time spent for planning, charting and coordination of care  Rulon Eisenmenger, MD, MPH 08/02/2021  I, Thana Ates, am acting as scribe for Dr. Nicholas Lose.  I have reviewed the above documentation for accuracy and completeness, and I agree with the above.

## 2021-08-02 ENCOUNTER — Other Ambulatory Visit: Payer: Self-pay

## 2021-08-02 ENCOUNTER — Inpatient Hospital Stay: Payer: Medicare PPO | Attending: Hematology and Oncology | Admitting: Hematology and Oncology

## 2021-08-02 DIAGNOSIS — Y842 Radiological procedure and radiotherapy as the cause of abnormal reaction of the patient, or of later complication, without mention of misadventure at the time of the procedure: Secondary | ICD-10-CM | POA: Insufficient documentation

## 2021-08-02 DIAGNOSIS — Z9221 Personal history of antineoplastic chemotherapy: Secondary | ICD-10-CM | POA: Insufficient documentation

## 2021-08-02 DIAGNOSIS — Z79899 Other long term (current) drug therapy: Secondary | ICD-10-CM | POA: Diagnosis not present

## 2021-08-02 DIAGNOSIS — M8580 Other specified disorders of bone density and structure, unspecified site: Secondary | ICD-10-CM | POA: Insufficient documentation

## 2021-08-02 DIAGNOSIS — Z171 Estrogen receptor negative status [ER-]: Secondary | ICD-10-CM

## 2021-08-02 DIAGNOSIS — G8929 Other chronic pain: Secondary | ICD-10-CM | POA: Insufficient documentation

## 2021-08-02 DIAGNOSIS — Z923 Personal history of irradiation: Secondary | ICD-10-CM | POA: Insufficient documentation

## 2021-08-02 DIAGNOSIS — J700 Acute pulmonary manifestations due to radiation: Secondary | ICD-10-CM | POA: Diagnosis not present

## 2021-08-02 DIAGNOSIS — Z853 Personal history of malignant neoplasm of breast: Secondary | ICD-10-CM | POA: Diagnosis present

## 2021-08-02 DIAGNOSIS — C50111 Malignant neoplasm of central portion of right female breast: Secondary | ICD-10-CM | POA: Diagnosis not present

## 2021-08-02 DIAGNOSIS — G629 Polyneuropathy, unspecified: Secondary | ICD-10-CM | POA: Insufficient documentation

## 2021-08-02 MED ORDER — OXYCODONE-ACETAMINOPHEN 5-325 MG PO TABS: 1.0000 | ORAL_TABLET | Freq: Three times a day (TID) | ORAL | 0 refills | Status: DC | PRN

## 2021-08-02 NOTE — Assessment & Plan Note (Signed)
1. 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 N0 M0 ER PR 0% HER-2 negative status post radiation therapy  2. Severe peripheral neuropathy: Currently on gabapentin, sees neurology sural nerve biopsy was performed by them. Slightimprovement in her symptoms.currently takes pain medications. Shediscontinued gabapentin.Tried acupuncture but it didn't work.  Breast Cancer Surveillance: 1. Breast exam  08/02/2021: Benign 2. Mammogram12/30/2021:  Benign breast density category C 3. Bone density 03/26/2015: T score -1.9 osteopenia: Currently on calcium and vitamin D 4. Radiation pneumonitis: chest CT scan 03/30/2015 showed right upper lung scarring. Patient does not have any cough or shortness of breath.  Chronic pain related to the breast surgery and neuropathy: Continues to require narcotic pain medication.  We will need to continue this regimen.  She has severe joint pains as well as chest wall pains. I renewed her prescription for oxycodone.  She is not able to decrease the dosage of the frequency of this medication.  April 2019 at [facility]: Hospitalization for pneumonia and lower extremity paralysis: Spent 1 month in rehab and is doing much better.  Return to clinic in 1 yearfor follow-up.

## 2021-08-25 ENCOUNTER — Other Ambulatory Visit: Payer: Self-pay | Admitting: Hematology and Oncology

## 2021-08-25 DIAGNOSIS — C50111 Malignant neoplasm of central portion of right female breast: Secondary | ICD-10-CM

## 2021-08-25 DIAGNOSIS — Z171 Estrogen receptor negative status [ER-]: Secondary | ICD-10-CM

## 2021-08-25 MED ORDER — OXYCODONE-ACETAMINOPHEN 5-325 MG PO TABS: 1.0000 | ORAL_TABLET | Freq: Three times a day (TID) | ORAL | 0 refills | Status: DC | PRN

## 2021-10-03 ENCOUNTER — Other Ambulatory Visit: Payer: Self-pay | Admitting: Hematology and Oncology

## 2021-10-03 DIAGNOSIS — Z171 Estrogen receptor negative status [ER-]: Secondary | ICD-10-CM

## 2021-10-03 DIAGNOSIS — C50111 Malignant neoplasm of central portion of right female breast: Secondary | ICD-10-CM

## 2021-10-03 MED ORDER — OXYCODONE-ACETAMINOPHEN 5-325 MG PO TABS: 1.0000 | ORAL_TABLET | Freq: Three times a day (TID) | ORAL | 0 refills | Status: DC | PRN

## 2021-10-12 ENCOUNTER — Other Ambulatory Visit: Payer: Self-pay | Admitting: Hematology and Oncology

## 2021-11-09 ENCOUNTER — Other Ambulatory Visit: Payer: Self-pay | Admitting: Hematology and Oncology

## 2021-11-09 DIAGNOSIS — Z171 Estrogen receptor negative status [ER-]: Secondary | ICD-10-CM

## 2021-11-09 DIAGNOSIS — C50111 Malignant neoplasm of central portion of right female breast: Secondary | ICD-10-CM

## 2021-11-09 MED ORDER — OXYCODONE-ACETAMINOPHEN 5-325 MG PO TABS: 1.0000 | ORAL_TABLET | Freq: Three times a day (TID) | ORAL | 0 refills | Status: DC | PRN

## 2021-12-14 ENCOUNTER — Other Ambulatory Visit: Payer: Self-pay | Admitting: Hematology and Oncology

## 2021-12-14 DIAGNOSIS — Z171 Estrogen receptor negative status [ER-]: Secondary | ICD-10-CM

## 2021-12-14 MED ORDER — OXYCODONE-ACETAMINOPHEN 5-325 MG PO TABS: 1.0000 | ORAL_TABLET | Freq: Three times a day (TID) | ORAL | 0 refills | Status: DC | PRN

## 2022-01-19 ENCOUNTER — Other Ambulatory Visit: Payer: Self-pay | Admitting: Hematology and Oncology

## 2022-01-19 DIAGNOSIS — Z171 Estrogen receptor negative status [ER-]: Secondary | ICD-10-CM

## 2022-01-19 MED ORDER — OXYCODONE-ACETAMINOPHEN 5-325 MG PO TABS: 1.0000 | ORAL_TABLET | Freq: Three times a day (TID) | ORAL | 0 refills | Status: DC | PRN

## 2022-02-23 ENCOUNTER — Other Ambulatory Visit: Payer: Self-pay | Admitting: Hematology and Oncology

## 2022-02-23 DIAGNOSIS — Z171 Estrogen receptor negative status [ER-]: Secondary | ICD-10-CM

## 2022-02-23 MED ORDER — OXYCODONE-ACETAMINOPHEN 5-325 MG PO TABS: 1.0000 | ORAL_TABLET | Freq: Three times a day (TID) | ORAL | 0 refills | Status: DC | PRN

## 2022-03-09 ENCOUNTER — Emergency Department: Payer: Medicare PPO

## 2022-03-09 ENCOUNTER — Inpatient Hospital Stay: Payer: Medicare PPO

## 2022-03-09 ENCOUNTER — Inpatient Hospital Stay
Admission: EM | Admit: 2022-03-09 | Discharge: 2022-03-17 | DRG: 025 | Disposition: A | Discharge status: Rehabilitation Facility | Payer: Medicare PPO | Attending: Obstetrics and Gynecology | Admitting: Obstetrics and Gynecology

## 2022-03-09 DIAGNOSIS — E039 Hypothyroidism, unspecified: Secondary | ICD-10-CM | POA: Diagnosis present

## 2022-03-09 DIAGNOSIS — G9349 Other encephalopathy: Secondary | ICD-10-CM | POA: Diagnosis present

## 2022-03-09 DIAGNOSIS — C7931 Secondary malignant neoplasm of brain: Secondary | ICD-10-CM | POA: Diagnosis present

## 2022-03-09 DIAGNOSIS — Z79890 Hormone replacement therapy (postmenopausal): Secondary | ICD-10-CM

## 2022-03-09 DIAGNOSIS — Z515 Encounter for palliative care: Secondary | ICD-10-CM

## 2022-03-09 DIAGNOSIS — Z853 Personal history of malignant neoplasm of breast: Secondary | ICD-10-CM | POA: Diagnosis not present

## 2022-03-09 DIAGNOSIS — I2510 Atherosclerotic heart disease of native coronary artery without angina pectoris: Secondary | ICD-10-CM | POA: Diagnosis present

## 2022-03-09 DIAGNOSIS — Z79899 Other long term (current) drug therapy: Secondary | ICD-10-CM

## 2022-03-09 DIAGNOSIS — Z7902 Long term (current) use of antithrombotics/antiplatelets: Secondary | ICD-10-CM

## 2022-03-09 DIAGNOSIS — R569 Unspecified convulsions: Secondary | ICD-10-CM

## 2022-03-09 DIAGNOSIS — K219 Gastro-esophageal reflux disease without esophagitis: Secondary | ICD-10-CM | POA: Diagnosis present

## 2022-03-09 DIAGNOSIS — Z87891 Personal history of nicotine dependence: Secondary | ICD-10-CM | POA: Diagnosis not present

## 2022-03-09 DIAGNOSIS — Z803 Family history of malignant neoplasm of breast: Secondary | ICD-10-CM

## 2022-03-09 DIAGNOSIS — D509 Iron deficiency anemia, unspecified: Secondary | ICD-10-CM | POA: Diagnosis present

## 2022-03-09 DIAGNOSIS — Z9221 Personal history of antineoplastic chemotherapy: Secondary | ICD-10-CM | POA: Diagnosis not present

## 2022-03-09 DIAGNOSIS — Z923 Personal history of irradiation: Secondary | ICD-10-CM | POA: Diagnosis not present

## 2022-03-09 DIAGNOSIS — Z833 Family history of diabetes mellitus: Secondary | ICD-10-CM | POA: Diagnosis not present

## 2022-03-09 DIAGNOSIS — E538 Deficiency of other specified B group vitamins: Secondary | ICD-10-CM | POA: Diagnosis present

## 2022-03-09 DIAGNOSIS — Z8249 Family history of ischemic heart disease and other diseases of the circulatory system: Secondary | ICD-10-CM

## 2022-03-09 DIAGNOSIS — I10 Essential (primary) hypertension: Secondary | ICD-10-CM | POA: Diagnosis present

## 2022-03-09 DIAGNOSIS — G629 Polyneuropathy, unspecified: Secondary | ICD-10-CM | POA: Diagnosis present

## 2022-03-09 DIAGNOSIS — R5381 Other malaise: Secondary | ICD-10-CM | POA: Diagnosis present

## 2022-03-09 DIAGNOSIS — R414 Neurologic neglect syndrome: Secondary | ICD-10-CM | POA: Diagnosis present

## 2022-03-09 DIAGNOSIS — Z880 Allergy status to penicillin: Secondary | ICD-10-CM

## 2022-03-09 DIAGNOSIS — Z800 Family history of malignant neoplasm of digestive organs: Secondary | ICD-10-CM

## 2022-03-09 DIAGNOSIS — G9389 Other specified disorders of brain: Principal | ICD-10-CM

## 2022-03-09 DIAGNOSIS — Z9011 Acquired absence of right breast and nipple: Secondary | ICD-10-CM | POA: Diagnosis not present

## 2022-03-09 DIAGNOSIS — D649 Anemia, unspecified: Secondary | ICD-10-CM | POA: Diagnosis present

## 2022-03-09 DIAGNOSIS — G936 Cerebral edema: Secondary | ICD-10-CM | POA: Diagnosis present

## 2022-03-09 LAB — COMPREHENSIVE METABOLIC PANEL
ALT: 27 U/L (ref 0–44)
AST: 33 U/L (ref 15–41)
Albumin: 4.1 g/dL (ref 3.5–5.0)
Alkaline Phosphatase: 84 U/L (ref 38–126)
Anion gap: 11 (ref 5–15)
BUN: 28 mg/dL — ABNORMAL HIGH (ref 8–23)
CO2: 22 mmol/L (ref 22–32)
Calcium: 9.5 mg/dL (ref 8.9–10.3)
Chloride: 108 mmol/L (ref 98–111)
Creatinine, Ser: 0.95 mg/dL (ref 0.44–1.00)
GFR, Estimated: >60 mL/min (ref >60)
Glucose, Bld: 70 mg/dL (ref 70–99)
Potassium: 4.1 mmol/L (ref 3.5–5.1)
Sodium: 141 mmol/L (ref 135–145)
Total Bilirubin: 1.2 mg/dL (ref 0.3–1.2)
Total Protein: 8.3 g/dL — ABNORMAL HIGH (ref 6.5–8.1)

## 2022-03-09 LAB — CBC
HCT: 36.4 % (ref 36.0–46.0)
Hemoglobin: 12 g/dL (ref 12.0–15.0)
MCH: 33.5 pg (ref 26.0–34.0)
MCHC: 33 g/dL (ref 30.0–36.0)
MCV: 101.7 fL — ABNORMAL HIGH (ref 80.0–100.0)
Platelets: 238 10*3/uL (ref 150–400)
RBC: 3.58 MIL/uL — ABNORMAL LOW (ref 3.87–5.11)
RDW: 13.9 % (ref 11.5–15.5)
WBC: 8.2 10*3/uL (ref 4.0–10.5)
nRBC: 0 % (ref 0.0–0.2)

## 2022-03-09 LAB — PROTIME-INR
INR: 1.1 (ref 0.8–1.2)
Prothrombin Time: 13.9 seconds (ref 11.4–15.2)

## 2022-03-09 LAB — APTT: aPTT: 27 seconds (ref 24–36)

## 2022-03-09 MED ORDER — LORAZEPAM 0.5 MG PO TABS
0.5000 mg | ORAL_TABLET | Freq: Every evening | ORAL | Status: DC | PRN
  Administered 2022-03-09: 0.5 mg via ORAL
  Filled 2022-03-09: qty 1

## 2022-03-09 MED ORDER — MORPHINE SULFATE (PF) 2 MG/ML IV SOLN: 2.0000 mg | INTRAVENOUS | Status: DC | PRN

## 2022-03-09 MED ORDER — TRAZODONE HCL 100 MG PO TABS: 100.0000 mg | ORAL_TABLET | Freq: Every day | ORAL | Status: DC

## 2022-03-09 MED ORDER — SODIUM CHLORIDE 0.9 % IV SOLN: 2000.0000 mg | Freq: Once | INTRAVENOUS | Status: DC

## 2022-03-09 MED ORDER — ACETAMINOPHEN 500 MG PO TABS
1000.0000 mg | ORAL_TABLET | Freq: Four times a day (QID) | ORAL | Status: DC | PRN
  Administered 2022-03-12: 1000 mg via ORAL
  Filled 2022-03-09: qty 2

## 2022-03-09 MED ORDER — ONDANSETRON HCL 4 MG PO TABS: 4.0000 mg | ORAL_TABLET | Freq: Four times a day (QID) | ORAL | Status: DC | PRN

## 2022-03-09 MED ORDER — DEXAMETHASONE SODIUM PHOSPHATE 4 MG/ML IJ SOLN
4.0000 mg | Freq: Two times a day (BID) | INTRAMUSCULAR | Status: DC
  Administered 2022-03-10 – 2022-03-11 (×3): 4 mg via INTRAVENOUS
  Filled 2022-03-09 (×3): qty 1

## 2022-03-09 MED ORDER — SODIUM CHLORIDE 0.9 % IV SOLN: INTRAVENOUS | Status: DC

## 2022-03-09 MED ORDER — LEVOTHYROXINE SODIUM 50 MCG PO TABS
50.0000 ug | ORAL_TABLET | Freq: Every day | ORAL | Status: DC
  Administered 2022-03-10 – 2022-03-17 (×7): 50 ug via ORAL
  Filled 2022-03-09 (×8): qty 1

## 2022-03-09 MED ORDER — GADOBUTROL 1 MMOL/ML IV SOLN
6.0000 mL | Freq: Once | INTRAVENOUS | Status: AC | PRN
  Administered 2022-03-09: 6 mL via INTRAVENOUS

## 2022-03-09 MED ORDER — LEVETIRACETAM IN NACL 1000 MG/100ML IV SOLN
1000.0000 mg | Freq: Once | INTRAVENOUS | Status: AC
  Administered 2022-03-09: 1000 mg via INTRAVENOUS
  Filled 2022-03-09: qty 100

## 2022-03-09 MED ORDER — ONDANSETRON HCL 4 MG/2ML IJ SOLN
4.0000 mg | Freq: Four times a day (QID) | INTRAMUSCULAR | Status: DC | PRN
  Administered 2022-03-09 – 2022-03-10 (×2): 4 mg via INTRAVENOUS
  Filled 2022-03-09 (×2): qty 2

## 2022-03-09 MED ORDER — HYDROCODONE-ACETAMINOPHEN 5-325 MG PO TABS
1.0000 | ORAL_TABLET | ORAL | Status: DC | PRN
  Administered 2022-03-11: 1 via ORAL
  Filled 2022-03-09: qty 1

## 2022-03-09 MED ORDER — OXYCODONE-ACETAMINOPHEN 5-325 MG PO TABS
1.0000 | ORAL_TABLET | Freq: Three times a day (TID) | ORAL | Status: DC | PRN
  Administered 2022-03-11: 1 via ORAL
  Filled 2022-03-09: qty 1

## 2022-03-09 MED ORDER — IOHEXOL 300 MG/ML  SOLN
100.0000 mL | Freq: Once | INTRAMUSCULAR | Status: AC | PRN
  Administered 2022-03-09: 100 mL via INTRAVENOUS

## 2022-03-09 MED ORDER — LORAZEPAM 2 MG/ML IJ SOLN
2.0000 mg | INTRAMUSCULAR | Status: DC | PRN
  Administered 2022-03-10: 2 mg via INTRAVENOUS
  Filled 2022-03-09: qty 1

## 2022-03-09 MED ORDER — FERROUS SULFATE 325 (65 FE) MG PO TABS
325.0000 mg | ORAL_TABLET | Freq: Every day | ORAL | Status: DC
  Administered 2022-03-10 – 2022-03-17 (×7): 325 mg via ORAL
  Filled 2022-03-09 (×7): qty 1

## 2022-03-09 MED ORDER — ALBUTEROL SULFATE (2.5 MG/3ML) 0.083% IN NEBU: 2.5000 mg | INHALATION_SOLUTION | RESPIRATORY_TRACT | Status: DC | PRN

## 2022-03-09 MED ORDER — LEVETIRACETAM IN NACL 500 MG/100ML IV SOLN
500.0000 mg | Freq: Two times a day (BID) | INTRAVENOUS | Status: DC
  Administered 2022-03-10 – 2022-03-12 (×5): 500 mg via INTRAVENOUS
  Filled 2022-03-09 (×5): qty 100

## 2022-03-09 MED ORDER — VITAMIN B-12 1000 MCG PO TABS
1000.0000 ug | ORAL_TABLET | Freq: Every day | ORAL | Status: DC
  Administered 2022-03-10 – 2022-03-17 (×7): 1000 ug via ORAL
  Filled 2022-03-09 (×7): qty 1

## 2022-03-09 MED ORDER — LEVETIRACETAM IN NACL 500 MG/100ML IV SOLN: 500.0000 mg | Freq: Two times a day (BID) | INTRAVENOUS | Status: DC

## 2022-03-09 MED ORDER — LEVETIRACETAM IN NACL 1000 MG/100ML IV SOLN
1000.0000 mg | Freq: Once | INTRAVENOUS | Status: AC
Start: 2022-03-09 — End: 2022-03-09
  Administered 2022-03-09: 1000 mg via INTRAVENOUS
  Filled 2022-03-09: qty 100

## 2022-03-09 MED ORDER — HYDRALAZINE HCL 20 MG/ML IJ SOLN: 10.0000 mg | INTRAMUSCULAR | Status: DC | PRN

## 2022-03-09 MED ORDER — DEXAMETHASONE SODIUM PHOSPHATE 10 MG/ML IJ SOLN
10.0000 mg | Freq: Once | INTRAMUSCULAR | Status: AC
Start: 2022-03-09 — End: 2022-03-09
  Administered 2022-03-09: 10 mg via INTRAVENOUS
  Filled 2022-03-09: qty 1

## 2022-03-09 NOTE — Consult Note (Addendum)
[facility] Telemedicine Consult   Requesting Provider: Dr. 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  History obtained from RN and later from patient daughter. I tried to call son-in-law's number but that number is not in service. However, story is not quite consistent each other.  Per RN, patient last seen well at 2:45 PM, reported body shaking, disorientation with headache and not able to walk without help.  Per daughter, patient was at her baseline until the time she was dressing up to pick up her grandchildren when she had hand shaking, reported at both hand shaking, could not get dressed. Her son-in-law was at scene found pt confused, could not get focused. But denies arm or leg shaking, facial twitch, speech change or LOC. EMS called and pt sent to ED for evaluation. In ED, family found pt even worse and could not smile and could not walk without assistance.  On exam, patient does have left facial droop, left arm and leg drift and decreased sensation on the left with ataxia.  CT head showed right frontal parietal mass lesion likely metastasis.  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  Currently on surveillance but continue to complaining of soreness in the breast.  Last breast exam 08/02/2021 benign and mammogram 09/23/2020 benign.  She also follow with Dr. Krista Blue at [facility] for severe peripheral neuropathy.  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  Neuro biopsy in 04/2014 showed moderately severe loss of myelinated fiber.  Had moderate improvement after IVIG treatment but declined further IVIG treatment due to family situation in 2016.  She was then lost in  follow-up.  LKW: 2:45 PM tpa given?: No, brain mass lesion IR Thrombectomy? 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  Ophthalmologic - fundi not visualized due to noncooperation.  Cardiovascular - Regular rhythm and rate.  Neuro - awake, alert, eyes open, orientated to place, month and people, but stated age 59 instead of 61. No aphasia, bradyphonia and positive speech, but following all simple commands. Able to name and repeat and read, mild dysarthria. No gaze palsy, tracking bilaterally, visual field full.  Left facial droop. Tongue midline.  Right upper and lower extremity no drift, left upper and lower extremity drift to bed but beyond 10/5 seconds. Sensation decreased on left, left FTN ataxic, gait not tested.  Marland Kitchen   NIH Stroke Scale  Level Of Consciousness 0=Alert; keenly responsive 1=Arouse to minor stimulation 2=Requires repeated stimulation to arouse or movements to pain 3=postures or unresponsive 0  LOC Questions to Month and Age 37=Answers both questions correctly 1=Answers one question correctly or dysarthria/intubated/trauma/language barrier 2=Answers neither question correctly or aphasia 1  LOC Commands      -  Open/Close eyes     -Open/close grip     -Pantomime commands if communication barrier 0=Performs both tasks correctly 1=Performs one task correctly 2=Performs neighter task correctly 0  Best Gaze     -Only assess horizontal gaze  0=Normal 1=Partial gaze palsy 2=Forced deviation, or total gaze paresis 0  Visual 0=No visual loss 1=Partial hemianopia 2=Complete hemianopia 3=Bilateral hemianopia (blind including cortical blindness) 0  Facial Palsy     -Use grimace if obtunded 0=Normal symmetrical movement 1=Minor paralysis (asymmetry) 2=Partial paralysis (lower face) 3=Complete paralysis (upper and lower face) 1  Motor  0=No drift for 10/5 seconds 1=Drift, but does not hit bed 2=Some antigravity effort, hits  bed 3=No effort against gravity, limb falls 4=No movement 0=Amputation/joint fusion Right Arm 0     Leg 0    Left Arm 1     Leg 1  Limb Ataxia     - FNT/HTS 0=Absent or does not understand or paralyzed or amputation/joint fusion 1=Present in one limb 2=Present in two limbs 1  Sensory 0=Normal 1=Mild to moderate sensory loss 2=Severe to total sensory loss or coma/unresponsive 1  Best Language 0=No aphasia, normal 1=Mild to moderate aphasia 2=Severe aphasia 3=Mute, global aphasia, or coma/unresponsive 0  Dysarthria 0=Normal 1=Mild to moderate 2=Severe, unintelligible or mute/anarthric 0=intubated/unable to test 1  Extinction/Neglect 0=No abnormality 1=visual/tactile/auditory/spatia/personal inattention/Extinction to bilateral simultaneous stimulation 2=Profound neglect/extinction more than 1 modality  0  Total   7      Imaging Reviewed:  CT HEAD CODE STROKE WO CONTRAST`  Result Date: 03/09/2022 CLINICAL DATA:  Code stroke. Mental status change, unknown cause. Left-sided weakness. Headache. EXAM: CT HEAD WITHOUT CONTRAST TECHNIQUE: Contiguous axial images were obtained from the base of the skull through the vertex without intravenous contrast. RADIATION DOSE REDUCTION: This exam was performed according to the departmental dose-optimization program which includes automated exposure control, adjustment of the mA and/or kV according to patient size and/or use of iterative reconstruction technique.  COMPARISON:  No pertinent prior exams available for comparison. FINDINGS: Brain: Mild generalized parenchymal atrophy. 2.3 x 1.6 cm hyperdense mass within the right parietal lobe with mild surrounding vasogenic edema (for instance as seen on series 5, image 43). Background moderate patchy and ill-defined hypoattenuation within the cerebral white matter, nonspecific but compatible with chronic small vessel ischemic disease. There is no acute intracranial hemorrhage. No demarcated cortical infarct. No extra-axial fluid collection. No midline shift. Vascular: No hyperdense vessel. Atherosclerotic calcifications. Skull: No fracture or aggressive osseous lesion. Sinuses/Orbits: No mass or acute finding within the imaged orbits. Fluid within the inferior left frontal sinus. Minimal mucosal thickening within the bilateral ethmoid and sphenoid sinuses at the imaged levels. Other: Bilateral TMJ osteoarthrosis. ASPECTS ([place] Stroke Program Early CT Score) - Ganglionic level infarction (caudate, lentiform nuclei, internal capsule, insula, M1-M3 cortex): 7 - Supraganglionic infarction (M4-M6 cortex): 3 Total score (0-10 with 10 being normal): 10 These results were called by telephone at the time of interpretation on 03/09/2022 at 5:10 pm to provider [facility] , who verbally acknowledged these results. IMPRESSION: 2.3 x 1.6 cm hyperdense mass within the right parietal lobe with mild surrounding vasogenic edema. This is most suspicious for an intracranial metastasis. A brain MRI without and with contrast is recommended for further evaluation. No evidence of acute infarct or acute intracranial hemorrhage. Moderate chronic small vessel ischemic changes within the cerebral white matter. Mild generalized parenchymal atrophy. Paranasal sinus disease, as described. Electronically Signed   By: Kellie Simmering D.O.   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 HA, not able to walk. But denies arm or leg shaking, no LOC.  Last seen well 2:45 PM.  On exam, patient does have left facial droop, left arm and leg drift and decreased sensation with ataxia.  NIH score 7, CT head showed right frontal parietal mass lesion likely metastasis.  Patient not a TNK or IR candidate given brain metastasis likely the cause to explain patient's symptoms.  Patient's symptoms concerning for partial seizure, recommend Keppra load followed by 500 mg twice daily.  Agree with EDP to call NSG consult regarding steroids.  Recommend MRI with and without contrast.  Seizure precaution.  Recommendations:  Seizure precaution.  Ativan as needed for seizure Keppra load followed by 500 mg twice daily MRI with and without contrast EEG  Neurosurgery consultation for steroid use Routine oncology follow-up consult We will follow Discussed with EDP Dr. Starleen Blue   Consult Participants: RN, patient, stroke response RN Location of the provider: [facility] CT suite Location of the patient: [facility]  Time Code Stroke Page received: 1700 Time neurologist arrived: 1701 Time NIHSS completed: 1714  This consult was provided via telemedicine with 2-way video and audio communication. The patient/family was informed that care would be provided in this way and agreed to receive care in this manner.   This patient is receiving care for possible acute neurological changes. There was 60 minutes of care by this provider at the time of service, including time for direct evaluation via telemedicine, review of medical records, imaging studies and discussion of findings with providers, the patient and/or family.  Rosalin Hawking, MD PhD Stroke Neurology 03/09/2022 5:36 PM

## 2022-03-09 NOTE — ED Notes (Signed)
Peri care performed, pt's brief changed, external urinary catheter applied. Pt. Repositioned for comfort. Pt.denies need at this time. Pt. Reminded urine specimen needed.

## 2022-03-09 NOTE — ED Notes (Signed)
Per neurologist pt. Is not candidate for TNK, and r/o LVO, or further code stroke intervention. Neuro suspects brain mass.

## 2022-03-09 NOTE — ED Notes (Signed)
Neurologist on screen 

## 2022-03-09 NOTE — ED Provider Notes (Signed)
[facility] Provider Note    Event Date/Time   First MD Initiated Contact with Patient 03/09/22 1621     (approximate)   History   Altered Mental Status   HPI  Tabitha Ramirez is a 76 y.o. female past medical history of breast cancer, hypertension, GERD who presents with altered mental status.  Patient tells me that she became acutely disoriented today around 53.  Her son notes that when she called he immediately came to her and noticed she was weak on the left side was having difficulty talking and difficulty moving her eyes.  No reports of falls.  Patient does complain of a mild headache denies nausea vomiting fevers chills.  Feeling weak on the left side.  Does have a remote history of breast cancer has been in remission for multiple years.    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   Gait abnormality 01/26/2014   Unspecified hereditary and idiopathic peripheral neuropathy 01/26/2014   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[employer]) 01/20/2014   Neuropathic pain 01/20/2014   Anxiety 01/20/2014   Unspecified deficiency anemia 01/20/2014   Hx of neutropenia 08/04/2013   Jaw pain 08/04/2013   Leukocytosis 04/27/2013   Pericardial effusion 04/27/2013   Cancer of central portion of right female breast ([place]) 02/04/2013   Need for influenza vaccination 11/29/2011   Hypertriglyceridemia  03/07/2011   Post-menopausal 03/07/2011   Anemia 12/17/2010   Heart murmur 12/17/2010   Seasonal allergies 12/17/2010   HYPOKALEMIA 02/19/2009   Essential hypertension, benign 04/09/2008     Physical Exam  Triage Vital Signs: ED Triage Vitals  Enc Vitals Group     BP 03/09/22 1535 139/71     Pulse Rate 03/09/22 1535 69     Resp 03/09/22 1535 18     Temp 03/09/22 1535 97.9 F (36.6 C)     Temp Source 03/09/22 1535 Oral     SpO2 03/09/22 1535 97 %     Weight --      Height 03/09/22 1536 _0  (1.626 m)     Head Circumference --      Peak Flow --      Pain Score 03/09/22 1536 0     Pain Loc --      Pain Edu? --      Excl. in [place]? --     Most recent vital signs: Vitals:   03/09/22 1724 03/09/22 1730  BP: (!) 157/87 (!) 135/112  Pulse: 68 65  Resp: 14 19  Temp:    SpO2: 100% 100%     General: Awake, no distress.  CV:  Good peripheral perfusion.  Resp:  Normal effort.  Abd:  No distention.  Neuro:             Awake, Alert, Oriented x 3  Other:  Is somewhat slow to respond she has pronator drift on the left arm and 3-5 strength in the left lower extremity compared to 5 out of 5 in the right PERRLA, EOMI  Sensation to light touch subjectively decreased on the left compared to right   ED Results / Procedures / Treatments  Labs (all labs ordered are listed, but only abnormal results are displayed) Labs Reviewed  COMPREHENSIVE METABOLIC PANEL - Abnormal; Notable for the following components:      Result Value   BUN 28 (*)    Total Protein 8.3 (*)    All other components within normal limits  CBC - Abnormal; Notable for the following components:   RBC 3.58 (*)    MCV 101.7 (*)    All other components within normal limits  URINALYSIS, ROUTINE W REFLEX MICROSCOPIC  PROTIME-INR  APTT  CBG MONITORING, ED     EKG     RADIOLOGY I reviewed and interpreted the CT scan of the head   PROCEDURES:  Critical Care performed: Yes, see critical care procedure  note(s)  .Critical Care  Performed by: Rada Hay, MD Authorized by: Rada Hay, MD   Critical care provider statement:    Critical care time (minutes):  30   Critical care was time spent personally by me on the following activities:  Development of treatment plan with patient or surrogate, discussions with consultants, evaluation of patient's response to treatment, examination of patient, ordering and review of laboratory studies, ordering and review of radiographic studies, ordering and performing treatments and interventions, pulse oximetry, re-evaluation of patient's condition and review of old charts   The patient is on the cardiac monitor to evaluate for evidence of arrhythmia and/or significant heart rate changes.   MEDICATIONS ORDERED IN ED: Medications  levETIRAcetam (KEPPRA) 2,000 mg in sodium chloride 0.9 % 250 mL IVPB (has no administration in time range)     IMPRESSION / MDM / ASSESSMENT AND PLAN / ED COURSE  I reviewed the triage vital signs and the nursing notes.                              Patient's presentation is most consistent with acute presentation with potential threat to life or bodily function.  Differential diagnosis includes, but is not limited to, ischemic or hemorrhagic CVA, seizure, Todd's paralysis, intracranial mass  Patient is a 76 year old female with breast cancer and peripheral neuropathy who presents with acute disorientation and left-sided weakness.  When I evaluated the patient and saw her focal left-sided weakness with time of onset around 245 I called a code stroke.  She was taken immediately to CT.  CT head is demonstrating right parietal mass lesion likely a brain met suspect from her breast cancer.  The acute presentation could be related to possible seizure.  Given this finding patient will not receive TNK.  Plan discussed with neurosurgery admit to the hospital service.  Teleneurologist spoke with patient's daughter and he  tells me that from the description sounds like she likely had a partial seizure which would make sense with her presentation.  I will load with Keppra.  FINAL CLINICAL IMPRESSION(S) / ED DIAGNOSES   Final diagnoses:  Brain mass     Rx / DC Orders   ED Discharge Orders     None        Note:  This document was prepared using Dragon voice recognition software and may include unintentional  dictation errors.   Rada Hay, MD 03/09/22 [phone]

## 2022-03-09 NOTE — ED Notes (Signed)
ED Provider at bedside. 

## 2022-03-09 NOTE — ED Notes (Signed)
NIH scale in CT with neuro observing.

## 2022-03-09 NOTE — ED Notes (Signed)
This RN contacted the floor to request RN be assigned to ready bed.

## 2022-03-09 NOTE — ED Notes (Signed)
Pt. And family member provided warm blankets per request.

## 2022-03-09 NOTE — ED Triage Notes (Signed)
Patient to ER via Pov with son-in-law. Family reports that patient has been disoriented and confused today, patient is usually independent at baseline but today is unable to walk without help. Patient denies feeling badly.

## 2022-03-09 NOTE — ED Notes (Addendum)
Triage RN notified this RN that pt. Was now in room, she has been in the bathroom.

## 2022-03-09 NOTE — ED Notes (Signed)
Pt dinner tray set up at bedside, son-in-law helping pt. Eat. NAD. Denies difficulty swallowing or eating.

## 2022-03-09 NOTE — ED Notes (Signed)
Code  stroke  called  to  Tabitha Ramirez  at  [place] per  DR  [facility] MD

## 2022-03-09 NOTE — Consult Note (Signed)
1655 Stroke cart activated. Pt in route to CT at time of activation 1700 Page to Dr Erlinda Hong. [phone] Dr Erlinda Hong joins stroke cart [phone] Dr Erlinda Hong advises no TNK and cancels TSRN

## 2022-03-09 NOTE — H&P (Addendum)
History and Physical    Tabitha Ramirez DOB: [birth year] DOA: 03/09/2022  PCP: [facility]  Patient coming from: Home  I have personally briefly reviewed patient's old medical records in [place]  Chief Complaint: Shaking, confusion  HPI: Tabitha Ramirez is a 76 y.o. 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  Patient unable to provide history so majority of history gained from patient's son-in-law at bedside.  Patient last seen well at 2:45 PM on the day of admission.  Patient was dressing to pick up her grandchildren when she noted the hand shaking and could not get dressed.  Patient was found to be confused and presented to the emergency room.  Patient did present with left facial droop, left arm and leg drift, decree sensation with ataxia.  CT head showed right frontal parietal mass lesion likely metastases.  Neurology was called by EDP.  Code stroke was initially called.  Was canceled after CT findings were known.  Neurosurgery was contacted by EDP who recommended Decadron for vasogenic edema associated with presumptive cerebral metastasis.  Given the high likelihood of seizure as a result of the mass patient was loaded with Keppra 2000 mg in ED.  No further seizure activity noted  ED Course: Code stroke called as above.  Canceled by neurology.  CT head revealed above findings.  Neurosurgery contacted by EDP who recommended steroids for headache and vasogenic edema.  Hospitalist contacted for admission  Review of Systems: As per HPI otherwise 14 point review of systems negative.    Past Medical History:  Diagnosis Date   Allergy    Anemia    Arthritis    "joints" (09/29/2013)   Breast cancer ([place])    GERD (gastroesophageal reflux disease)     Heart murmur    History of blood transfusion    "related to chemo/breast cancer" (09/29/2013)   Hypertension    Migraines    Personal history of chemotherapy    Personal history of radiation therapy    Radiation 11/05/13-12/24/13   Right breast    TMJ (temporomandibular joint syndrome)    "left; just dx'd" (09/29/2013    Past Surgical History:  Procedure Laterality Date   AXILLARY LYMPH NODE BIOPSY Right 03/10/2013   Procedure: AXILLARY LYMPH NODE BIOPSY;  Surgeon: Adin Hector, MD;  Location: [facility];  Service: General;  Laterality: Right;  sentinel node with blue dye   BREAST BIOPSY     BREAST LUMPECTOMY Right 09/29/2013   needle localization w/axillary LND/notes 09/29/2013   BREAST LUMPECTOMY WITH NEEDLE LOCALIZATION AND AXILLARY LYMPH NODE DISSECTION Right 09/29/2013   Procedure: RIGHT BREAST NEEDLE LOCALIZED LUMPECTOMY AND AXILLARY LYMPH NODE DISSECTION;  Surgeon: Adin Hector, MD;  Location: [facility];  Service: General;  Laterality: Right;   COLONOSCOPY N/A 04/30/2013   Procedure: COLONOSCOPY;  Surgeon: Wonda Horner, MD;  Location: [facility];  Service: Endoscopy;  Laterality: N/A;   ESOPHAGOGASTRODUODENOSCOPY N/A 04/28/2013   Procedure: ESOPHAGOGASTRODUODENOSCOPY (EGD);  Surgeon: Wonda Horner, MD;  Location: [facility];  Service: Endoscopy;  Laterality: N/A;   MASTECTOMY Right 09/29/2013   "partial"   PORT-A-CATH REMOVAL  09/29/2013   PORT-A-CATH REMOVAL Left 09/29/2013   Procedure: REMOVAL PORT-A-CATH;  Surgeon: Adin Hector, MD;  Location: [facility];  Service: General;  Laterality: Left;   PORTACATH PLACEMENT N/A 03/10/2013  Procedure: INSERTION PORT-A-CATH WITH FLUOROSCOPY AND ULTRASOUND;  Surgeon: Adin Hector, MD;  Location: [facility];  Service: General;  Laterality: N/A;   SURAL NERVE BX Right 05/13/2014   Procedure: SURAL NERVE BIOPSY;  Surgeon: Hosie Spangle, MD;  Location: [facility];  Service: Neurosurgery;  Laterality: Right;  Right sural nerve biopsy   TONSILLECTOMY      TOTAL ABDOMINAL HYSTERECTOMY     With bilateral salpingo-oophorectomy     reports that she has quit smoking. Her smoking use included cigarettes. She has never used smokeless tobacco. She reports that she does not drink alcohol and does not use drugs.  Allergies  Allergen Reactions   Penicillins Rash    Family History  Problem Relation Age of Onset   Cancer Mother 93       Unknown type of cancer   Congestive Heart Failure Mother    Breast cancer Mother    Colon cancer Brother    Diabetes Brother 30   Heart disease Brother        AMI 05/22/2012   Congestive Heart Failure Brother    Cancer Maternal Grandmother 70       Unknown type of cancer   Breast cancer Maternal Grandmother    Family history of breast cancer in mother and maternal grandmother as above Prior to Admission medications   Medication Sig Start Date End Date Taking? Authorizing Provider  Cholecalciferol (D3-1000) 25 MCG (1000 UT) capsule Take 1,000 Units by mouth daily.   Yes [provider]  ferrous sulfate 325 (65 FE) MG tablet Take 325 mg by mouth daily with breakfast.   Yes [provider]  LORazepam (ATIVAN) 0.5 MG tablet Take 1 tablet by mouth at bedtime as needed. 01/15/17  Yes [provider]  potassium chloride (KLOR-CON M) 10 MEQ tablet Take 10 mEq by mouth once.   Yes [provider]  triamterene-hydrochlorothiazide (MAXZIDE-25) 37.5-25 MG per tablet Take 0.5 tablets by mouth every other day. 04/09/15  Yes Sid Falcon, MD  acetaminophen (TYLENOL) 500 MG tablet Take 1,000 mg by mouth every 6 (six) hours as needed for mild pain.    [provider]  clopidogrel (PLAVIX) 75 MG tablet Take 1 tablet (75 mg total) by mouth daily. 04/29/18   Nicholas Lose, MD  levothyroxine (SYNTHROID) 50 MCG tablet Take 50 mcg by mouth daily. 01/07/22   [provider]  levothyroxine (SYNTHROID, LEVOTHROID) 75 MCG tablet Take 75 mcg by mouth daily before breakfast.    [provider]  LORazepam (ATIVAN) 0.5 MG tablet TAKE 1 TABLET BY MOUTH EVERY NIGHT AT BEDTIME AS NEEDED 10/12/21   Nicholas Lose, MD  ondansetron (ZOFRAN) 8 MG tablet TAKE 1 TABLET (8 MG TOTAL) BY MOUTH EVERY 12 (TWELVE) HOURS AS NEEDED FOR NAUSEA. 12/31/14   Nicholas Lose, MD  OVER THE COUNTER MEDICATION Take 15 mLs by mouth daily. Reported on 12/01/2015    [provider]  oxyCODONE-acetaminophen (PERCOCET/ROXICET) 5-325 MG tablet Take 1 tablet by mouth every 8 (eight) hours as needed for severe pain. 02/23/22   Nicholas Lose, MD  rosuvastatin (CRESTOR) 10 MG tablet Take 10 mg by mouth daily. 01/10/22   [provider]  traZODone (DESYREL) 100 MG tablet Take 1 tablet (100 mg total) by mouth at bedtime. 04/24/17   Nicholas Lose, MD  vitamin B-12 (CYANOCOBALAMIN) 1000 MCG tablet Take 1 tablet (1,000 mcg total) by mouth daily. 04/29/18   Nicholas Lose, MD    Physical Exam: Vitals:  03/09/22 1724 03/09/22 1730 03/09/22 1745 03/09/22 1800  BP: (!) 157/87 (!) 135/112  (!) 142/82  Pulse: 68 65 66 65  Resp: '14 19 19 18  '$ Temp:      TempSrc:      SpO2: 100% 100% 100% 100%  Height:         Vitals:   03/09/22 1724 03/09/22 1730 03/09/22 1745 03/09/22 1800  BP: (!) 157/87 (!) 135/112  (!) 142/82  Pulse: 68 65 66 65  Resp: '14 19 19 18  '$ Temp:      TempSrc:      SpO2: 100% 100% 100% 100%  Height:       General: Weak voice.  Appears frail HEENT: Normocephalic, atraumatic, mild left facial droop Neck, supple, trachea midline, no tenderness Heart: Regular rate and rhythm, S1/S2 normal, no murmurs Lungs: Clear to auscultation bilaterally, no adventitious sounds, normal work of breathing Abdomen: Thin, soft, NT/ND, normal bowel sounds Extremities: Right-sided weakness Skin: No rashes or lesions, normal color Neurologic: Cranial nerves grossly intact, sensation intact Psychiatric: Flattened affect    Labs on Admission: I have personally reviewed following labs and imaging  studies  CBC: Recent Labs  Lab 03/09/22 1537  WBC 8.2  HGB 12.0  HCT 36.4  MCV 101.7*  PLT 431   Basic Metabolic Panel: Recent Labs  Lab 03/09/22 1537  NA 141  K 4.1  CL 108  CO2 22  GLUCOSE 70  BUN 28*  CREATININE 0.95  CALCIUM 9.5   GFR: CrCl cannot be calculated (Unknown ideal weight.). Liver Function Tests: Recent Labs  Lab 03/09/22 1537  AST 33  ALT 27  ALKPHOS 84  BILITOT 1.2  PROT 8.3*  ALBUMIN 4.1   No results for input(s): "LIPASE", "AMYLASE" in the last 168 hours. No results for input(s): "AMMONIA" in the last 168 hours. Coagulation Profile: Recent Labs  Lab 03/09/22 1734  INR 1.1   Cardiac Enzymes: No results for input(s): "CKTOTAL", "CKMB", "CKMBINDEX", "TROPONINI" in the last 168 hours. BNP (last 3 results) No results for input(s): "PROBNP" in the last 8760 hours. HbA1C: No results for input(s): "HGBA1C" in the last 72 hours. CBG: No results for input(s): "GLUCAP" in the last 168 hours. Lipid Profile: No results for input(s): "CHOL", "HDL", "LDLCALC", "TRIG", "CHOLHDL", "LDLDIRECT" in the last 72 hours. Thyroid Function Tests: No results for input(s): "TSH", "T4TOTAL", "FREET4", "T3FREE", "THYROIDAB" in the last 72 hours. Anemia Panel: No results for input(s): "VITAMINB12", "FOLATE", "FERRITIN", "TIBC", "IRON", "RETICCTPCT" in the last 72 hours. Urine analysis:    Component Value Date/Time   COLORURINE YELLOW 02/28/2013 0918   APPEARANCEUR CLEAR 02/28/2013 0918   LABSPEC 1.025 12/23/2013 1120   PHURINE 6.0 12/23/2013 1120   PHURINE 6.5 02/28/2013 0918   GLUCOSEU Negative 12/23/2013 1120   HGBUR Negative 12/23/2013 1120   HGBUR TRACE (A) 02/28/2013 0918   BILIRUBINUR Negative 12/23/2013 1120   KETONESUR Negative 12/23/2013 1120   KETONESUR NEGATIVE 02/28/2013 0918   PROTEINUR < 30 12/23/2013 1120   PROTEINUR NEGATIVE 02/28/2013 0918   UROBILINOGEN 0.2 12/23/2013 1120   NITRITE Negative 12/23/2013 1120   NITRITE NEGATIVE  02/28/2013 0918   LEUKOCYTESUR Small 12/23/2013 1120    Radiological Exams on Admission: CT HEAD CODE STROKE WO CONTRAST`  Result Date: 03/09/2022 CLINICAL DATA:  Code stroke. Mental status change, unknown cause. Left-sided weakness. Headache. EXAM: CT HEAD WITHOUT CONTRAST TECHNIQUE: Contiguous axial images were obtained from the base of the skull through the vertex without intravenous contrast. RADIATION DOSE REDUCTION:  This exam was performed according to the departmental dose-optimization program which includes automated exposure control, adjustment of the mA and/or kV according to patient size and/or use of iterative reconstruction technique. COMPARISON:  No pertinent prior exams available for comparison. FINDINGS: Brain: Mild generalized parenchymal atrophy. 2.3 x 1.6 cm hyperdense mass within the right parietal lobe with mild surrounding vasogenic edema (for instance as seen on series 5, image 43). Background moderate patchy and ill-defined hypoattenuation within the cerebral white matter, nonspecific but compatible with chronic small vessel ischemic disease. There is no acute intracranial hemorrhage. No demarcated cortical infarct. No extra-axial fluid collection. No midline shift. Vascular: No hyperdense vessel. Atherosclerotic calcifications. Skull: No fracture or aggressive osseous lesion. Sinuses/Orbits: No mass or acute finding within the imaged orbits. Fluid within the inferior left frontal sinus. Minimal mucosal thickening within the bilateral ethmoid and sphenoid sinuses at the imaged levels. Other: Bilateral TMJ osteoarthrosis. ASPECTS ([place] Stroke Program Early CT Score) - Ganglionic level infarction (caudate, lentiform nuclei, internal capsule, insula, M1-M3 cortex): 7 - Supraganglionic infarction (M4-M6 cortex): 3 Total score (0-10 with 10 being normal): 10 These results were called by telephone at the time of interpretation on 03/09/2022 at 5:10 pm to provider [facility] , who  verbally acknowledged these results. IMPRESSION: 2.3 x 1.6 cm hyperdense mass within the right parietal lobe with mild surrounding vasogenic edema. This is most suspicious for an intracranial metastasis. A brain MRI without and with contrast is recommended for further evaluation. No evidence of acute infarct or acute intracranial hemorrhage. Moderate chronic small vessel ischemic changes within the cerebral white matter. Mild generalized parenchymal atrophy. Paranasal sinus disease, as described. Electronically Signed   By: Kellie Simmering D.O.   On: 03/09/2022 17:15    EKG: Independently reviewed.  Sinus rhythm  Assessment/Plan Principal Problem:   Brain mass Active Problems:   Essential hypertension, benign   Anemia   Seizure (HCC)  New brain mass with vasogenic edema Confusion Weakness This likely represents metastasis Plan: Admit inpatient Oncology consult Neurosurgery consult Decadron 10 mg x 1 Decadron 4 mg every 12 hours  CT chest abdomen pelvis with IV contrast MRI brain with and without contrast Discussed with Dr. 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 Oncology consulted Radiation oncology to see during her hospital admission  Essential hypertension Hold antihypertensives for now As needed hydralazine Vitals per unit protocol  Hypothyroidism PTA Synthroid  Iron deficiency anemia B12 deficiency PTA ferrous sulfate and B12   DVT prophylaxis: SCDs Code Status: Full Family Communication: Son-in-law at bedside 6/15 Disposition Plan: Anticipate return to previous home environment Consults called: Oncology-Rao, neurosurgery-[facility], neurology-Xu Admission status: Inpatient, MedSurg   Sidney Ace  MD [facility]  If 7PM-7AM, please contact night-coverage   03/09/2022, 6:18 PM

## 2022-03-10 ENCOUNTER — Inpatient Hospital Stay: Payer: Medicare PPO

## 2022-03-10 ENCOUNTER — Other Ambulatory Visit: Payer: Self-pay

## 2022-03-10 DIAGNOSIS — Z515 Encounter for palliative care: Secondary | ICD-10-CM

## 2022-03-10 DIAGNOSIS — G9389 Other specified disorders of brain: Secondary | ICD-10-CM | POA: Diagnosis not present

## 2022-03-10 DIAGNOSIS — R569 Unspecified convulsions: Secondary | ICD-10-CM | POA: Diagnosis not present

## 2022-03-10 LAB — URINALYSIS, ROUTINE W REFLEX MICROSCOPIC
Bacteria, UA: NONE SEEN
Bilirubin Urine: NEGATIVE
Glucose, UA: NEGATIVE mg/dL
Ketones, ur: NEGATIVE mg/dL
Leukocytes,Ua: NEGATIVE
Nitrite: NEGATIVE
Protein, ur: NEGATIVE mg/dL
Specific Gravity, Urine: 1.038 — ABNORMAL HIGH (ref 1.005–1.030)
pH: 7 (ref 5.0–8.0)

## 2022-03-10 LAB — CBC
HCT: 32 % — ABNORMAL LOW (ref 36.0–46.0)
Hemoglobin: 10.8 g/dL — ABNORMAL LOW (ref 12.0–15.0)
MCH: 33.1 pg (ref 26.0–34.0)
MCHC: 33.8 g/dL (ref 30.0–36.0)
MCV: 98.2 fL (ref 80.0–100.0)
Platelets: 236 10*3/uL (ref 150–400)
RBC: 3.26 MIL/uL — ABNORMAL LOW (ref 3.87–5.11)
RDW: 13.6 % (ref 11.5–15.5)
WBC: 4.8 10*3/uL (ref 4.0–10.5)
nRBC: 0 % (ref 0.0–0.2)

## 2022-03-10 LAB — BASIC METABOLIC PANEL
Anion gap: 9 (ref 5–15)
BUN: 29 mg/dL — ABNORMAL HIGH (ref 8–23)
CO2: 25 mmol/L (ref 22–32)
Calcium: 9.4 mg/dL (ref 8.9–10.3)
Chloride: 105 mmol/L (ref 98–111)
Creatinine, Ser: 0.93 mg/dL (ref 0.44–1.00)
GFR, Estimated: >60 mL/min (ref >60)
Glucose, Bld: 164 mg/dL — ABNORMAL HIGH (ref 70–99)
Potassium: 3.7 mmol/L (ref 3.5–5.1)
Sodium: 139 mmol/L (ref 135–145)

## 2022-03-10 LAB — PROTIME-INR
INR: 1.1 (ref 0.8–1.2)
Prothrombin Time: 13.8 seconds (ref 11.4–15.2)

## 2022-03-10 LAB — CK: Total CK: 116 U/L (ref 38–234)

## 2022-03-10 MED ORDER — ENSURE ENLIVE PO LIQD
237.0000 mL | Freq: Three times a day (TID) | ORAL | Status: DC
  Administered 2022-03-11 – 2022-03-17 (×17): 237 mL via ORAL

## 2022-03-10 MED ORDER — HALOPERIDOL LACTATE 5 MG/ML IJ SOLN: 1.0000 mg | Freq: Once | INTRAMUSCULAR | Status: DC

## 2022-03-10 NOTE — Progress Notes (Signed)
Eeg done 

## 2022-03-10 NOTE — Consult Note (Addendum)
Referring Physician:  No referring provider defined for this encounter.  Primary Physician:  [facility]  Chief Complaint:  possible seizure, new intracranial mass  History of Present Illness: 03/10/2022 Tabitha Ramirez is a 76 y.o. female who presents with the chief complaint of witness event yesterday around 50.  She is right-handed.  The patient cannot provide full history, but her daughter and son-in-law, with whom she lives, reports that she had bilateral hand shaking and difficulty with getting dressed around 2:45 PM.  She was confused and could not focus.  By the time she was brought to the emergency department, she had not recovered to her baseline.  She has history of breast cancer treated in 2014, but has had no recurrences since that time.  She has previously been treated in [place].  She denies headache at this time and does report that she feels better today than yesterday.  She is not at her baseline.  Review of Systems:  A 10 point review of systems is negative, except for the pertinent positives and negatives detailed in the HPI.  Past Medical History: Past Medical History:  Diagnosis Date   Allergy    Anemia    Arthritis    "joints" (09/29/2013)   Breast cancer ([place])    GERD (gastroesophageal reflux disease)    Heart murmur    History of blood transfusion    "related to chemo/breast cancer" (09/29/2013)   Hypertension    Migraines    Personal history of chemotherapy    Personal history of radiation therapy    Radiation 11/05/13-12/24/13   Right breast    TMJ (temporomandibular joint syndrome)    "left; just dx'd" (09/29/2013    Past Surgical History: Past Surgical History:  Procedure Laterality Date   AXILLARY LYMPH NODE BIOPSY Right 03/10/2013   Procedure: AXILLARY LYMPH NODE BIOPSY;  Surgeon: Adin Hector, MD;  Location: [facility];  Service: General;  Laterality: Right;  sentinel node with blue dye   BREAST BIOPSY     BREAST LUMPECTOMY  Right 09/29/2013   needle localization w/axillary LND/notes 09/29/2013   BREAST LUMPECTOMY WITH NEEDLE LOCALIZATION AND AXILLARY LYMPH NODE DISSECTION Right 09/29/2013   Procedure: RIGHT BREAST NEEDLE LOCALIZED LUMPECTOMY AND AXILLARY LYMPH NODE DISSECTION;  Surgeon: Adin Hector, MD;  Location: [facility];  Service: General;  Laterality: Right;   COLONOSCOPY N/A 04/30/2013   Procedure: COLONOSCOPY;  Surgeon: Wonda Horner, MD;  Location: [facility];  Service: Endoscopy;  Laterality: N/A;   ESOPHAGOGASTRODUODENOSCOPY N/A 04/28/2013   Procedure: ESOPHAGOGASTRODUODENOSCOPY (EGD);  Surgeon: Wonda Horner, MD;  Location: [facility];  Service: Endoscopy;  Laterality: N/A;   MASTECTOMY Right 09/29/2013   "partial"   PORT-A-CATH REMOVAL  09/29/2013   PORT-A-CATH REMOVAL Left 09/29/2013   Procedure: REMOVAL PORT-A-CATH;  Surgeon: Adin Hector, MD;  Location: [facility];  Service: General;  Laterality: Left;   PORTACATH PLACEMENT N/A 03/10/2013   Procedure: INSERTION PORT-A-CATH WITH FLUOROSCOPY AND ULTRASOUND;  Surgeon: Adin Hector, MD;  Location: [facility];  Service: General;  Laterality: N/A;   SURAL NERVE BX Right 05/13/2014   Procedure: SURAL NERVE BIOPSY;  Surgeon: Hosie Spangle, MD;  Location: [facility];  Service: Neurosurgery;  Laterality: Right;  Right sural nerve biopsy   TONSILLECTOMY     TOTAL ABDOMINAL HYSTERECTOMY     With bilateral salpingo-oophorectomy    Allergies: Allergies as of 03/09/2022 - Review Complete 03/09/2022  Allergen Reaction Noted   Penicillins Rash  04/09/2008    Medications:  Current Facility-Administered Medications:    0.9 %  sodium chloride infusion, , Intravenous, Continuous, Sreenath, Sudheer B, MD, Last Rate: 75 mL/hr at 03/09/22 2333, New Bag at 03/09/22 2333   acetaminophen (TYLENOL) tablet 1,000 mg, 1,000 mg, Oral, Q6H PRN, Sreenath, Sudheer B, MD   albuterol (PROVENTIL) (2.5 MG/3ML) 0.083% nebulizer solution 2.5 mg, 2.5 mg, Nebulization, Q2H PRN, Sreenath,  Sudheer B, MD   dexamethasone (DECADRON) injection 4 mg, 4 mg, Intravenous, Q12H, Sreenath, Sudheer B, MD   ferrous sulfate tablet 325 mg, 325 mg, Oral, Q breakfast, Sreenath, Sudheer B, MD   haloperidol lactate (HALDOL) injection 1 mg, 1 mg, Intravenous, Once, Sharion Settler, NP   hydrALAZINE (APRESOLINE) injection 10 mg, 10 mg, Intravenous, Q4H PRN, Sreenath, Sudheer B, MD   HYDROcodone-acetaminophen (NORCO/VICODIN) 5-325 MG per tablet 1-2 tablet, 1-2 tablet, Oral, Q4H PRN, Sreenath, Sudheer B, MD   levETIRAcetam (KEPPRA) IVPB 500 mg/100 mL premix, 500 mg, Intravenous, Q12H, Sreenath, Sudheer B, MD, Last Rate: 400 mL/hr at 03/10/22 0552, 500 mg at 03/10/22 4696   levothyroxine (SYNTHROID) tablet 50 mcg, 50 mcg, Oral, Daily, Sreenath, Sudheer B, MD   LORazepam (ATIVAN) injection 2 mg, 2 mg, Intravenous, Q4H PRN, Priscella Mann, Sudheer B, MD   LORazepam (ATIVAN) tablet 0.5 mg, 0.5 mg, Oral, QHS PRN, Priscella Mann, Sudheer B, MD, 0.5 mg at 03/09/22 2336   morphine (PF) 2 MG/ML injection 2 mg, 2 mg, Intravenous, Q2H PRN, Sreenath, Sudheer B, MD   ondansetron (ZOFRAN) tablet 4 mg, 4 mg, Oral, Q6H PRN **OR** ondansetron (ZOFRAN) injection 4 mg, 4 mg, Intravenous, Q6H PRN, Priscella Mann, Sudheer B, MD, 4 mg at 03/09/22 2334   oxyCODONE-acetaminophen (PERCOCET/ROXICET) 5-325 MG per tablet 1 tablet, 1 tablet, Oral, Q8H PRN, Priscella Mann, Sudheer B, MD   vitamin B-12 (CYANOCOBALAMIN) tablet 1,000 mcg, 1,000 mcg, Oral, Daily, Sreenath, Sudheer B, MD   Social History: Social History   Tobacco Use   Smoking status: Former    Years: 1.00    Types: Cigarettes   Smokeless tobacco: Never   Tobacco comments:    smoked in college 1 yr   Substance Use Topics   Alcohol use: No    Comment: Wine occasionally   Drug use: No    Family Medical History: Family History  Problem Relation Age of Onset   Cancer Mother 20       Unknown type of cancer   Congestive Heart Failure Mother    Breast cancer Mother    Colon cancer  Brother    Diabetes Brother 36   Heart disease Brother        AMI 05/22/2012   Congestive Heart Failure Brother    Cancer Maternal Grandmother 70       Unknown type of cancer   Breast cancer Maternal Grandmother     Physical Examination: Vitals:   03/09/22 2230 03/10/22 0408  BP: (!) 157/69 116/68  Pulse: 82 65  Resp: 18 15  Temp: 97.8 F (36.6 C) 98.2 F (36.8 C)  SpO2: 100% 99%     General: Patient is well developed, well nourished, calm, collected, and in no apparent distress.  Psychiatric: Patient is non-anxious.  Head:  Pupils equal, round, and reactive to light.  ENT:  Oral mucosa appears well hydrated.  Neck:   Supple.  Full range of motion.  Respiratory: Patient is breathing without any difficulty.  Extremities: No edema.  Vascular: Palpable pulses in dorsal pedal vessels.  Skin:   On exposed skin, there  are no abnormal skin lesions.  NEUROLOGICAL:  General: In no acute distress.   Awake, alert, oriented to person, place, and time.  Pupils equal round and reactive to light.  Facial tone is symmetric.  Tongue protrusion is midline.  There is no pronator drift.  She speaks very slowly.  She is neglecting the left side.  Evaluation of her visual fields reveals left-sided visual field cut in both eyes.  This is worse in the inferior than superior quadrants, but she is not able to see very well in the superior quadrant on the left side of her vision as well.   Strength: Side Biceps Triceps Deltoid Interossei Grip Wrist Ext. Wrist Flex.  R '5 5 5 5 5 5 5  '$ L '5 5 5 5 5 5 5   '$ Side Iliopsoas Quads Hamstring PF DF EHL  R '5 5 5 5 5 5  '$ L '5 5 5 5 5 5    '$ Bilateral upper and lower extremity sensation is intact to light touch. Reflexes are 1+ and symmetric at the biceps, triceps, brachioradialis, patella and achilles. Hoffman's is absent.  Clonus is not present.  Toes are down-going.    Gait is untested.    Imaging: MRI Brain 03/09/22 IMPRESSION: 1. 3.0 x 2.3  x 1.8 cm irregular enhancing mass involving the right parietal lobe with localized vasogenic edema. 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 A primary CNS neoplasm would be the primary differential consideration. 2. Mild diffusion signal abnormality involving the cortical gray matter about the right parietal region and right thalamus, suspected to reflect superimposed changes of seizure. Correlation with history and EEG suggested. A possible small evolving subacute ischemic infarct at the right thalamus could be considered in the correct clinical setting, although is felt to be less likely. 3. Underlying age-related cerebral atrophy with moderately advanced chronic microvascular ischemic disease.     Electronically Signed   By: Jeannine Boga M.D.   On: 03/09/2022 21:48  CT CAP 03/09/22 IMPRESSION: 1. No evidence of primary or metastatic disease within the chest, abdomen, and pelvis. 2. Moderate multi-vessel coronary artery calcification. 3. 9 mm indeterminate nodule within the inferomedial right breast, not well characterized on this examination. Correlation with dedicated breast imaging is recommended.     Electronically Signed   By: Fidela Salisbury M.D.   On: 03/09/2022 19:03   I have personally reviewed the images and agree with the above interpretation.  Labs:    Latest Ref Rng & Units 03/09/2022    3:37 PM 06/22/2014   11:07 AM 05/12/2014   12:38 PM  CBC  WBC 4.0 - 10.5 K/uL 8.2  6.1  6.6   Hemoglobin 12.0 - 15.0 g/dL 12.0  10.7  10.3   Hematocrit 36.0 - 46.0 % 36.4  32.5  30.4   Platelets 150 - 400 K/uL 238  284  245     Assessment and Plan: Ms. Ehlert is a pleasant 76 y.o. 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  This could be a primary glial neoplasm, but appears more consistent with a metastasis in the right parietal lobe.  She has left-sided neglect as well as left-sided  visual field cut.  I advised her that she is unable to drive at this time.  She has remote history of breast cancer but this could represent either a breast cancer metastasis or new diagnosis of metastatic disease of unknown origin.  I agree with oncology consultation, after  which we will discuss whether radiosurgery would be considered without a tissue diagnosis.  If tissue diagnosis is required, I would likely recommend resection of this lesion as biopsy harbors approximately the same risk as open resection.  I will touch base with her hospitalist as well as the oncologist later today so that we can have a multidisciplinary discussion about next steps.    Avion Kutzer K. Izora Ribas MD, [place] [facility] of [facility]  Addendum to above:  I have spoken to Dr. Janese Banks and to the patient's daughter to confirm history.  Based on our discussions, I have recommended craniotomy for tumor resection, as we have no other option for tissue diagnosis.    I discussed the planned procedure at length with the patient's daughter, including the risks, benefits, alternatives, and indications. The risks discussed include but are not limited to bleeding, infection, need for reoperation, spinal fluid leak, stroke, vision loss, anesthetic complication, coma, paralysis, and even death. I also described in detail that improvement was not guaranteed.  The patient's daughter expressed understanding of these risks, and asked that we proceed with surgery. I described the surgery in layman's terms, and gave ample opportunity for questions, which were answered to the best of my ability.  Based on my impression of the patient's sensorium earlier today, she may not have capacity to give consent though I will reassess later today prior to surgery.  Meade Maw

## 2022-03-10 NOTE — Consult Note (Signed)
[place] at [facility] Telephone:([phone] [phone]   Name: Tabitha Ramirez Date: 03/10/2022 MRN: [id number]  DOB: 12-Mar-1946  Patient Care Team: [place], [place] [facility] as PCP - General (Family Medicine) Magrinat, Virgie Dad, MD (Inactive) as Consulting Physician (Oncology)    REASON FOR CONSULTATION: Tabitha Ramirez is a 76 y.o. female with multiple medical problems including history of triple negative breast cancer in remission for 7 years status post chemotherapy/XRT, hypertension, and hypothyroidism, who was admitted to hospital 03/09/2022 with altered mental status and left-sided weakness/facial drooping.  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  Palliative care was consulted to help address goals.  SOCIAL HISTORY:     reports that she has quit smoking. Her smoking use included cigarettes. She has never used smokeless tobacco. She reports that she does not drink alcohol and does not use drugs.  Patient is unmarried.  She has a significant other who lives nearby.  Patient lives part-time with her daughter to help care for her grandchild.  Patient was a Proofreader.  ADVANCE DIRECTIVES:  Not on file  CODE STATUS: Full code  PAST MEDICAL HISTORY: Past Medical History:  Diagnosis Date   Allergy    Anemia    Arthritis    "joints" (09/29/2013)   Breast cancer ([place])    GERD (gastroesophageal reflux disease)    Heart murmur    History of blood transfusion    "related to chemo/breast cancer" (09/29/2013)   Hypertension    Migraines    Personal history of chemotherapy    Personal history of radiation therapy    Radiation 11/05/13-12/24/13   Right breast    TMJ (temporomandibular joint syndrome)    "left; just dx'd" (09/29/2013    PAST SURGICAL HISTORY:  Past Surgical History:  Procedure  Laterality Date   AXILLARY LYMPH NODE BIOPSY Right 03/10/2013   Procedure: AXILLARY LYMPH NODE BIOPSY;  Surgeon: Adin Hector, MD;  Location: [place];  Service: General;  Laterality: Right;  sentinel node with blue dye   BREAST BIOPSY     BREAST LUMPECTOMY Right 09/29/2013   needle localization w/axillary LND/notes 09/29/2013   BREAST LUMPECTOMY WITH NEEDLE LOCALIZATION AND AXILLARY LYMPH NODE DISSECTION Right 09/29/2013   Procedure: RIGHT BREAST NEEDLE LOCALIZED LUMPECTOMY AND AXILLARY LYMPH NODE DISSECTION;  Surgeon: Adin Hector, MD;  Location: [place];  Service: General;  Laterality: Right;   COLONOSCOPY N/A 04/30/2013   Procedure: COLONOSCOPY;  Surgeon: Wonda Horner, MD;  Location: [facility];  Service: Endoscopy;  Laterality: N/A;   ESOPHAGOGASTRODUODENOSCOPY N/A 04/28/2013   Procedure: ESOPHAGOGASTRODUODENOSCOPY (EGD);  Surgeon: Wonda Horner, MD;  Location: [facility];  Service: Endoscopy;  Laterality: N/A;   MASTECTOMY Right 09/29/2013   "partial"   PORT-A-CATH REMOVAL  09/29/2013   PORT-A-CATH REMOVAL Left 09/29/2013   Procedure: REMOVAL PORT-A-CATH;  Surgeon: Adin Hector, MD;  Location: [place];  Service: General;  Laterality: Left;   PORTACATH PLACEMENT N/A 03/10/2013   Procedure: INSERTION PORT-A-CATH WITH FLUOROSCOPY AND ULTRASOUND;  Surgeon: Adin Hector, MD;  Location: [place];  Service: General;  Laterality: N/A;   SURAL NERVE BX Right 05/13/2014   Procedure: SURAL NERVE BIOPSY;  Surgeon: Hosie Spangle, MD;  Location: [facility];  Service: Neurosurgery;  Laterality: Right;  Right sural nerve biopsy   TONSILLECTOMY  TOTAL ABDOMINAL HYSTERECTOMY     With bilateral salpingo-oophorectomy    HEMATOLOGY/ONCOLOGY HISTORY:  Oncology History  Cancer of central portion of right female breast ([place])  01/31/2013 Mammogram   Area of distortion upper-outer quadrant right breast diffuse calcifications in both breasts 2.5 cm with prominent right axillary lymph node 2.5 cm   01/31/2013  Initial Diagnosis   Cancer of central portion of female breast: 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 Ki-67 84%; sentinel lymph node sampling 03/10/2013 showed1/2 SLN pos PR 3% PR 6% Ki-67 15% HER-2 negative   02/10/2013 Breast MRI   Right breast: Rim enhancing necrotic mass 3.4 cm with a 2 cm level I axillary lymph node   03/17/2013 - 08/25/2013 Neo-Adjuvant Chemotherapy   Dose dense Adriamycin and Cytoxan Followed by weekly Taxol and carboplatin x12; chemotherapy-induced anemia and peripheral neuropathy were the complications   10/27/2977 Surgery   Right breast lumpectomy scatter residual invasive mass 1.1 cm with [facility] 0/11 lymph nodes no cancer seen: ER 0% PR 0% insufficient to mark it on HER-2 testing   11/12/2013 - 12/24/2013 Radiation Therapy   Radiation therapy to the breast and axilla     ALLERGIES:  is allergic to penicillins.  MEDICATIONS:  Current Facility-Administered Medications  Medication Dose Route Frequency Provider Last Rate Last Admin   acetaminophen (TYLENOL) tablet 1,000 mg  1,000 mg Oral Q6H PRN Sreenath, Sudheer B, MD       albuterol (PROVENTIL) (2.5 MG/3ML) 0.083% nebulizer solution 2.5 mg  2.5 mg Nebulization Q2H PRN Sreenath, Sudheer B, MD       dexamethasone (DECADRON) injection 4 mg  4 mg Intravenous Q12H Ralene Muskrat B, MD   4 mg at 03/10/22 8921   ferrous sulfate tablet 325 mg  325 mg Oral Q breakfast Ralene Muskrat B, MD   325 mg at 03/10/22 0857   haloperidol lactate (HALDOL) injection 1 mg  1 mg Intravenous Once Sharion Settler, NP       hydrALAZINE (APRESOLINE) injection 10 mg  10 mg Intravenous Q4H PRN Sreenath, Sudheer B, MD       HYDROcodone-acetaminophen (NORCO/VICODIN) 5-325 MG per tablet 1-2 tablet  1-2 tablet Oral Q4H PRN Priscella Mann, Sudheer B, MD       levETIRAcetam (KEPPRA) IVPB 500 mg/100 mL premix  500 mg Intravenous Q12H Ralene Muskrat B, MD 400 mL/hr at 03/10/22 0552 500 mg at 03/10/22 0552   levothyroxine (SYNTHROID) tablet 50  mcg  50 mcg Oral Daily Ralene Muskrat B, MD   50 mcg at 03/10/22 0857   LORazepam (ATIVAN) injection 2 mg  2 mg Intravenous Q4H PRN Ralene Muskrat B, MD       LORazepam (ATIVAN) tablet 0.5 mg  0.5 mg Oral QHS PRN Ralene Muskrat B, MD   0.5 mg at 03/09/22 2336   morphine (PF) 2 MG/ML injection 2 mg  2 mg Intravenous Q2H PRN Sreenath, Sudheer B, MD       ondansetron (ZOFRAN) tablet 4 mg  4 mg Oral Q6H PRN Sreenath, Sudheer B, MD       Or   ondansetron (ZOFRAN) injection 4 mg  4 mg Intravenous Q6H PRN Ralene Muskrat B, MD   4 mg at 03/09/22 2334   oxyCODONE-acetaminophen (PERCOCET/ROXICET) 5-325 MG per tablet 1 tablet  1 tablet Oral Q8H PRN Ralene Muskrat B, MD       vitamin B-12 (CYANOCOBALAMIN) tablet 1,000 mcg  1,000 mcg Oral Daily Ralene Muskrat B, MD   1,000 mcg at 03/10/22 [phone]  VITAL SIGNS: BP 130/73 (BP Location: Left Arm)   Pulse (!) 58   Temp 98.1 F (36.7 C) (Oral)   Resp 16   Ht $R'5\' 4"'Au$  (1.626 m)   Wt 144 lb (65.3 kg)   SpO2 100%   BMI 24.72 kg/m  Filed Weights   03/12/22 0000  Weight: 144 lb (65.3 kg)    Estimated body mass index is 24.72 kg/m as calculated from the following:   Height as of this encounter: $RemoveBeforeD'5\' 4"'JOfKehcTczPmcM$  (1.626 m).   Weight as of this encounter: 144 lb (65.3 kg).  LABS: CBC:    Component Value Date/Time   WBC 4.8 2022-03-12 0639   HGB 10.8 (L) 03-12-22 0639   HGB 10.7 (L) 06/22/2014 1107   HCT 32.0 (L) 03-12-22 0639   HCT 32.5 (L) 06/22/2014 1107   PLT 236 2022-03-12 0639   PLT 284 06/22/2014 1107   MCV 98.2 2022/03/12 0639   MCV 102.2 (H) 06/22/2014 1107   NEUTROABS 2.8 06/22/2014 1107   LYMPHSABS 2.4 06/22/2014 1107   MONOABS 0.7 06/22/2014 1107   EOSABS 0.2 06/22/2014 1107   BASOSABS 0.0 06/22/2014 1107   Comprehensive Metabolic Panel:    Component Value Date/Time   NA 139 2022-03-12 0639   NA 140 03/30/2015 0944   K 3.7 03/12/2022 0639   K 3.8 03/30/2015 0944   CL 105 03/12/22 0639   CL 100 03/13/2013 0918   CO2  25 12-Mar-2022 0639   CO2 26 03/30/2015 0944   BUN 29 (H) 03-12-22 0639   BUN 18.9 03/30/2015 0944   CREATININE 0.93 03/12/2022 0639   CREATININE 0.9 03/30/2015 0944   GLUCOSE 164 (H) 2022-03-12 0639   GLUCOSE 103 03/30/2015 0944   GLUCOSE 169 (H) 03/13/2013 0918   CALCIUM 9.4 2022/03/12 0639   CALCIUM 9.7 03/30/2015 0944   AST 33 03/09/2022 1537   AST 20 03/30/2015 0944   ALT 27 03/09/2022 1537   ALT 19 03/30/2015 0944   ALKPHOS 84 03/09/2022 1537   ALKPHOS 100 03/30/2015 0944   BILITOT 1.2 03/09/2022 1537   BILITOT 0.45 03/30/2015 0944   PROT 8.3 (H) 03/09/2022 1537   PROT 7.9 03/30/2015 0944   ALBUMIN 4.1 03/09/2022 1537   ALBUMIN 3.1 (L) 03/30/2015 0944    RADIOGRAPHIC STUDIES: EEG adult  Result Date: 2022-03-12 Lora Havens, MD     2022/03/12  1:27 PM Patient Name: Tabitha Ramirez MRN: [id number] Epilepsy Attending: Lora Havens Referring Physician/Provider: Rosalin Hawking, MD Date: 03/12/22 Duration: 32.07 mins Patient history:  76 y.o. 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 EEG to evaluate for seizure Level of alertness: Awake, asleep AEDs during EEG study: LEV Technical aspects: This EEG study was done with scalp electrodes positioned according to the 10-20 International system of electrode placement. Electrical activity was acquired at a sampling rate of $Remov'500Hz'eDJmyD$  and reviewed with a high frequency filter of $RemoveB'70Hz'prZWSWam$  and a low frequency filter of $RemoveB'1Hz'GlvrHlip$ . EEG data were recorded continuously and digitally stored. Description: The posterior dominant rhythm consists of 8 Hz activity of moderate voltage (25-35 uV) seen predominantly in posterior head regions, symmetric and reactive to eye opening and eye closing. Sleep was characterized by vertex waves, sleep spindles (12 to 14 Hz), maximal frontocentral  region. EEG showed continuous generalized and lateralized right hemisphere 3 to 6 Hz theta-delta slowing which  at times appears sharply contoured. Physiologic photic driving was not seen during photic stimulation.  Hyperventilation was not performed.   ABNORMALITY - Continuous slow, generalized and lateralized right hemisphere IMPRESSION: This study is suggestive of cortical dysfunction in right hemisphere likely secondary to underlying structural abnormality.  Additionally there is mild to moderate diffuse encephalopathy, nonspecific etiology.  No seizures or epileptiform discharges were seen throughout the recording. Lora Havens   MR BRAIN W WO CONTRAST  Result Date: 03/09/2022 CLINICAL DATA:  Initial evaluation for metastatic disease. EXAM: MRI HEAD WITHOUT AND WITH CONTRAST TECHNIQUE: Multiplanar, multiecho pulse sequences of the brain and surrounding structures were obtained without and with intravenous contrast. CONTRAST:  60m GADAVIST GADOBUTROL 1 MMOL/ML IV SOLN COMPARISON:  Prior CT from earlier the same day. FINDINGS: Brain: Age-related cerebral atrophy. Patchy and confluent T2/FLAIR hyperintensity involving the periventricular and deep white matter both cerebral hemispheres as well as the pons, most consistent with chronic small vessel ischemic disease, moderately advanced in nature. Irregular enhancing mass measuring 3.0 x 2.3 x 1.8 cm seen along the gray-white matter differentiation of the right parietal lobe (AP by craniocaudad by transverse). Surrounding T2/FLAIR signal intensity consistent with vasogenic edema without significant regional mass effect. Few scattered foci of internal susceptibility artifact consistent with blood products and/or necrosis. Findings most concerning for a possible metastatic lesion. Primary CNS neoplasm would be the primary differential consideration. No other definite lesions or abnormal enhancement elsewhere within the brain. No other mass effect or midline  shift. Additionally, there is mildly increased diffusion signal abnormality involving the cortical gray matter about the right parietal region (series 5, image 31). Overall appearance is suggestive of possible superimposed changes of seizure. Similarly, mild diffusion signal abnormality seen at the right thalamus (series 5, image 23). This could also be related to seizure. A possible small evolving subacute ischemic infarct could also be considered. No significant associated enhancement about these locations. No other evidence for acute or subacute ischemia. Gray-white matter differentiation otherwise maintained. No other areas of chronic cortical infarction. No other acute or chronic intracranial blood products. Ventricles normal size without hydrocephalus. No extra-axial fluid collection. Pituitary gland suprasellar region normal. Vascular: Major intracranial vascular flow voids are maintained. Asymmetric FLAIR signal intensity involving the left transverse and sigmoid sinus without definite T1 correlate, likely slow/sluggish flow. Skull and upper cervical spine: Craniocervical junction with normal limits. Bone marrow signal intensity mildly heterogeneous without focal marrow replacing lesion. No scalp soft tissue abnormality. Sinuses/Orbits: Globes and orbital soft tissues within normal limits. Scattered mucosal thickening noted about the ethmoidal air cells and maxillary sinuses. Superimposed air-fluid level within the right maxillary sinus, consistent with acute sinusitis. Other: No mastoid effusion. IMPRESSION: 1. 3.0 x 2.3 x 1.8 cm irregular enhancing mass involving the right parietal lobe with localized vasogenic edema. 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 A primary CNS neoplasm would be the primary differential consideration. 2. Mild diffusion signal abnormality involving the cortical gray matter about the right parietal region and right thalamus, suspected to reflect  superimposed changes of seizure. Correlation with history and EEG suggested. A possible small evolving subacute ischemic infarct at the right thalamus could be considered in the correct clinical setting, although is felt to be less likely. 3. Underlying age-related cerebral atrophy with moderately advanced chronic microvascular ischemic disease. Electronically Signed   By: BJeannine BogaM.D.   On: 03/09/2022 21:48   CT CHEST ABDOMEN PELVIS W CONTRAST  Result Date: 03/09/2022 CLINICAL DATA:  Brain metastases,  unknown primary EXAM: CT CHEST, ABDOMEN, AND PELVIS WITH CONTRAST TECHNIQUE: Multidetector CT imaging of the chest, abdomen and pelvis was performed following the standard protocol during bolus administration of intravenous contrast. RADIATION DOSE REDUCTION: This exam was performed according to the departmental dose-optimization program which includes automated exposure control, adjustment of the mA and/or kV according to patient size and/or use of iterative reconstruction technique. CONTRAST:  160m OMNIPAQUE IOHEXOL 300 MG/ML  SOLN COMPARISON:  None Available. FINDINGS: CT CHEST FINDINGS Cardiovascular: Moderate multi-vessel coronary artery calcification. Global cardiac size within normal limits. No pericardial effusion. Central pulmonary arteries are of normal caliber. Mild atherosclerotic calcification within the thoracic aorta. No aortic aneurysm Mediastinum/Nodes: No enlarged mediastinal, hilar, or axillary lymph nodes. Thyroid gland, trachea, and esophagus demonstrate no significant findings. Lungs/Pleura: Ground-glass opacity with associated traction bronchiolectasis within the right apex likely represents focal fibrotic change. Subpleural fibrotic change within the a anterior right upper lobe and right middle lobe likely represents post radiation change. The lungs are otherwise clear. No pneumothorax or pleural effusion. Central airways are widely patent. Musculoskeletal: No acute bone  abnormality. No lytic or blastic bone lesion. Partial right mastectomy and axillary lymph node dissection has been performed. A indeterminate 9 mm nodule is seen within the a inferomedial right breast, not well characterized on this examination. CT ABDOMEN PELVIS FINDINGS Hepatobiliary: No focal liver abnormality is seen. No gallstones, gallbladder wall thickening, or biliary dilatation. Pancreas: Unremarkable Spleen: Unremarkable Adrenals/Urinary Tract: Adrenal glands are unremarkable. Kidneys are normal, without renal calculi, focal lesion, or hydronephrosis. Bladder is unremarkable. Stomach/Bowel: Stomach is within normal limits. Appendix appears normal. No evidence of bowel wall thickening, distention, or inflammatory changes. Vascular/Lymphatic: Aortic atherosclerosis. No enlarged abdominal or pelvic lymph nodes. Reproductive: Status post hysterectomy. No adnexal masses. Other: No abdominal wall hernia.  Rectum unremarkable. Musculoskeletal: No acute bone abnormality within the abdomen and pelvis. No lytic or blastic bone lesion. IMPRESSION: 1. No evidence of primary or metastatic disease within the chest, abdomen, and pelvis. 2. Moderate multi-vessel coronary artery calcification. 3. 9 mm indeterminate nodule within the inferomedial right breast, not well characterized on this examination. Correlation with dedicated breast imaging is recommended. Electronically Signed   By: AFidela SalisburyM.D.   On: 03/09/2022 19:03   CT HEAD CODE STROKE WO CONTRAST`  Result Date: 03/09/2022 CLINICAL DATA:  Code stroke. Mental status change, unknown cause. Left-sided weakness. Headache. EXAM: CT HEAD WITHOUT CONTRAST TECHNIQUE: Contiguous axial images were obtained from the base of the skull through the vertex without intravenous contrast. RADIATION DOSE REDUCTION: This exam was performed according to the departmental dose-optimization program which includes automated exposure control, adjustment of the mA and/or kV  according to patient size and/or use of iterative reconstruction technique. COMPARISON:  No pertinent prior exams available for comparison. FINDINGS: Brain: Mild generalized parenchymal atrophy. 2.3 x 1.6 cm hyperdense mass within the right parietal lobe with mild surrounding vasogenic edema (for instance as seen on series 5, image 43). Background moderate patchy and ill-defined hypoattenuation within the cerebral white matter, nonspecific but compatible with chronic small vessel ischemic disease. There is no acute intracranial hemorrhage. No demarcated cortical infarct. No extra-axial fluid collection. No midline shift. Vascular: No hyperdense vessel. Atherosclerotic calcifications. Skull: No fracture or aggressive osseous lesion. Sinuses/Orbits: No mass or acute finding within the imaged orbits. Fluid within the inferior left frontal sinus. Minimal mucosal thickening within the bilateral ethmoid and sphenoid sinuses at the imaged levels. Other: Bilateral TMJ osteoarthrosis. ASPECTS ([facility] F[facility]roke Program Early CT Score) -  Ganglionic level infarction (caudate, lentiform nuclei, internal capsule, insula, M1-M3 cortex): 7 - Supraganglionic infarction (M4-M6 cortex): 3 Total score (0-10 with 10 being normal): 10 These results were called by telephone at the time of interpretation on 03/09/2022 at 5:10 pm to provider [facility] , who verbally acknowledged these results. IMPRESSION: 2.3 x 1.6 cm hyperdense mass within the right parietal lobe with mild surrounding vasogenic edema. This is most suspicious for an intracranial metastasis. A brain MRI without and with contrast is recommended for further evaluation. No evidence of acute infarct or acute intracranial hemorrhage. Moderate chronic small vessel ischemic changes within the cerebral white matter. Mild generalized parenchymal atrophy. Paranasal sinus disease, as described. Electronically Signed   By: Kellie Simmering D.O.   On: 03/09/2022 17:15    PERFORMANCE STATUS  (ECOG) : 2 - Symptomatic, <50% confined to bed  Review of Systems Unless otherwise noted, a complete review of systems is negative.  Physical Exam General: NAD Pulmonary: Unlabored Extremities: no edema, no joint deformities Skin: no rashes Neurological: Weakness  IMPRESSION: I met with patient, daughter, and significant other.  Patient was able to converse and appears oriented to the place and situation leading to her hospitalization.  Patient states that she is aware that she has a brain mass and seemed agreeable to the current plan for work-up.  Daughter states that patient has ACP documents but those are old and patient and family would be interested in updating them.  We will consult the chaplain to assist with this.  Prior to this hospitalization, patient was primarily staying with her daughter but this was to help with the care of grandchildren.  Daughter reports that patient was independent with care and still driving.  Patient also maintained her own home despite mostly staying with the daughter.  Daughter seems to recognize the significance of the presumed cancer diagnosis.  Family is in agreement with work-up. We discussed the likelihood of future discussions regarding goals once pathology is known.  Symptomatically, patient denies any distressing symptoms.  There was concern about possible seizure activity leading to the hospitalization the patient is now empirically covered by [employer].  She is also on dexamethasone for vasogenic edema seen on CT/MRI.  PLAN: -Continue current scope of treatment -Patient pending biopsy tomorrow -Chaplain to assist with ACP documents -Will follow  Case discussed with Dr. Janese Banks   Time Total: 30 minutes  Visit consisted of counseling and education dealing with the complex and emotionally intense issues of symptom management and palliative care in the setting of serious and potentially life-threatening illness.Greater than 50%  of this time was  spent counseling and coordinating care related to the above assessment and plan.  Signed by: Altha Harm, PhD, NP-C

## 2022-03-10 NOTE — Evaluation (Addendum)
Occupational Therapy Evaluation Patient Details Name: CASHLYNN YEARWOOD MRN: [id number] DOB: 11/17/1945 Today's Date: 03/10/2022   History of Present Illness Pt is a 76 year old female admitetd with likely seizure with newly diagnosed lesion  in the right parietal lobe after presenting to the ED with hand shaking, confusion, and disorientation. PMH significant for breast CA in remission for 7 years, was triple negative status post lobectomy, chemotherapy, radiation, hypertension, hypothyroidism, severe peripheral neuropathy status post IVIG   Clinical Impression   Chart reviewed, pt greeted in bed, agreeable to OT eval. Co tx completed with PT on this date due to pt complexity. Pt is alert, oriented to self, place, grossly oriented to date and situation. Increased time required for processing. PTA pt was MOD I-I in all ADL/IADL, cooks, cleans, drives and cares for 35 year old grand son.  Pt appears to present with a L visual field cut requiring max multi modal cueing for head turn and attention to the L with compensatory strategies. Posterior and L lateral lean present in sitting and standing balance tasks. LUE with impairments in AROM, sensation, FMC/dexterity. Pt also presents with apraxia. MOD A required for functional transfer to bedside commode, MAX A required for toileting. At this time pt would benefit from post acute care intensive rehab to facilitate return to PLOF. Pt is left in chair, NAD, all needs met. OT will follow acutely.      Recommendations for follow up therapy are one component of a multi-disciplinary discharge planning process, led by the attending physician.  Recommendations may be updated based on patient status, additional functional criteria and insurance authorization.   Follow Up Recommendations  Acute inpatient rehab (3hours/day)    Assistance Recommended at Discharge Frequent or constant Supervision/Assistance  Patient can return home with the following A lot of help with  walking and/or transfers;A lot of help with bathing/dressing/bathroom;Direct supervision/assist for medications management;Direct supervision/assist for financial management    Functional Status Assessment  Patient has had a recent decline in their functional status and demonstrates the ability to make significant improvements in function in a reasonable and predictable amount of time.  Equipment Recommendations  Other (comment) (per next venue)    Recommendations for Other Services       Precautions / Restrictions Precautions Precautions: Fall Precaution Comments: L field cut Restrictions Weight Bearing Restrictions: No      Mobility Bed Mobility Overal bed mobility: Needs Assistance Bed Mobility: Supine to Sit     Supine to sit: Mod assist, Min assist          Transfers Overall transfer level: Needs assistance Equipment used: 2 person hand held assist Transfers: Sit to/from Stand Sit to Stand: Mod assist, +2 safety/equipment                  Balance Overall balance assessment: Needs assistance Sitting-balance support: No upper extremity supported, Feet supported Sitting balance-Leahy Scale: Fair   Postural control: Posterior lean, Left lateral lean   Standing balance-Leahy Scale: Poor                             ADL either performed or assessed with clinical judgement   ADL Overall ADL's : Needs assistance/impaired Eating/Feeding: NPO   Grooming: Wash/dry face;Sitting;Set up Grooming Details (indicate cue type and reason): able to attend to L side of face with intermittent vcs             Lower Body Dressing:  Maximal assistance Lower Body Dressing Details (indicate cue type and reason): socks Toilet Transfer: Moderate assistance;Stand-pivot;BSC/3in1   Toileting- Clothing Manipulation and Hygiene: Maximal assistance;Sit to/from stand       Functional mobility during ADLs: Minimal assistance;Moderate assistance;+2 for physical  assistance       Vision   Vision Assessment?: Yes;Vision impaired- to be further tested in functional context Visual Fields: Left visual field deficit Additional Comments: OT will continue to assess     Perception Perception Perception: Impaired   Praxis Praxis Praxis: Impaired Praxis Impairment Details: Initiation;Ideomotor;Motor planning    Pertinent Vitals/Pain Pain Assessment Pain Assessment: No/denies pain     Hand Dominance Right   Extremity/Trunk Assessment Upper Extremity Assessment Upper Extremity Assessment: LUE deficits/detail (RUE 4+/5 strength throughout) LUE Deficits / Details: LUE AROM shoulder flexion 3/4 full AROM, elbow flexion/extension WFL, wrist flexion/extension WFL; LUE Sensation: decreased light touch;decreased proprioception LUE Coordination: decreased fine motor;decreased gross motor No increased tone noted   Lower Extremity Assessment Lower Extremity Assessment: LLE deficits/detail LLE Deficits / Details: L hip and knee grossly 3-/5, ankle 2+/5; ataxic in movement; difficulty with purposeful termination of muscle activation at times; absent sensory awareness throughout L LE LLE Sensation: decreased light touch       Communication Communication Communication:  (generally slowed and deliberate in speech at times)   Cognition Arousal/Alertness: Awake/alert Behavior During Therapy: Flat affect Overall Cognitive Status: Impaired/Different from baseline Area of Impairment: Attention, Memory, Following commands, Awareness, Problem solving, Orientation                 Orientation Level: Disoriented to, Situation Current Attention Level: Sustained   Following Commands: Follows one step commands with increased time   Awareness: Emergent Problem Solving: Slow processing, Decreased initiation, Difficulty sequencing, Requires verbal cues, Requires tactile cues       General Comments       Exercises     Shoulder Instructions       Home Living Family/patient expects to be discharged to:: Private residence Living Arrangements: Children (daughter, son-in-law and 40 year old grandson) Available Help at Discharge: Family;Available PRN/intermittently Type of Home: House Home Access: Stairs to enter [employer] of Steps: 4-5 Entrance Stairs-Rails: Right;Left Home Layout: Able to live on main level with bedroom/bathroom               Home Equipment: Tub bench          Prior Functioning/Environment Prior Level of Function : Independent/Modified Independent             Mobility Comments: Ambulatory without assist device; assisted with care for 24 year old grandchild ADLs Comments: pt reports MOD I with ADL, some assist with IADL however generally MOD I        OT Problem List: Decreased strength;Decreased range of motion;Decreased coordination;Decreased knowledge of use of DME or AE;Decreased activity tolerance;Impaired balance (sitting and/or standing);Decreased cognition;Decreased safety awareness;Impaired sensation      OT Treatment/Interventions: Self-care/ADL training;DME and/or AE instruction;Therapeutic exercise;Neuromuscular education;Patient/family education;Balance training;Therapeutic activities;Cognitive remediation/compensation    OT Goals(Current goals can be found in the care plan section) Acute Rehab OT Goals Patient Stated Goal: get stronger OT Goal Formulation: With patient Time For Goal Achievement: 03/24/22 Potential to Achieve Goals: Good  OT Frequency: Min 4X/week    Co-evaluation PT/OT/SLP Co-Evaluation/Treatment: Yes Reason for Co-Treatment: Complexity of the patient's impairments (multi-system involvement);Necessary to address cognition/behavior during functional activity;For patient/therapist safety PT goals addressed during session: Mobility/safety with mobility OT goals addressed during session: ADL's and  self-care      AM-PAC OT "6 Clicks" Daily Activity      Outcome Measure Help from another person eating meals?: A Little (pt is NPO at this time, simulated needs MIN A) Help from another person taking care of personal grooming?: A Little Help from another person toileting, which includes using toliet, bedpan, or urinal?: A Lot Help from another person bathing (including washing, rinsing, drying)?: A Lot Help from another person to put on and taking off regular upper body clothing?: A Lot Help from another person to put on and taking off regular lower body clothing?: A Lot 6 Click Score: 14   End of Session Equipment Utilized During Treatment: Gait belt  Activity Tolerance: Patient tolerated treatment well Patient left: in chair;with call bell/phone within reach;with chair alarm set  OT Visit Diagnosis: Unsteadiness on feet (R26.81);Other abnormalities of gait and mobility (R26.89)                Time: 5284-1324 OT Time Calculation (min): 43 min Charges:  OT General Charges $OT Visit: 1 Visit OT Evaluation $OT Eval High Complexity: 1 High  Shanon Payor, OTD OTR/L  03/10/22, 4:11 PM

## 2022-03-10 NOTE — Progress Notes (Signed)
Neurology Progress Note  Patient ID: Tabitha Ramirez is a 76 y.o. 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 s/p lobectomy, chemotherapy and radiation, severe peripheral neuropathy status post IVIG presented to ED for code stroke (confusion, left arm/leg drift and ataxia), HCT c/f tumor, confirmed on MRI brain, started on keppra    Major interval events/Subjective: -Reports she is feeling better this morning than yesterday, is still slightly confused but reports no headache currently and that she did have a headache yesterday -Reports prior to yesterday no headaches -Notes that she was just getting ready to live independently and just started driving again, understands that neurosurgery also recommended no driving at this time  Exam: Current vital signs: BP 130/73 (BP Location: Left Arm)   Pulse (!) 58   Temp 98.1 F (36.7 C) (Oral)   Resp 16   Ht '5\' 4"'$  (1.626 m)   Wt 65.3 kg   SpO2 100%   BMI 24.72 kg/m  Vital signs in last 24 hours: Temp:  [97.8 F (36.6 C)-98.2 F (36.8 C)] 98.1 F (36.7 C) (06/16 0823) Pulse Rate:  [58-98] 58 (06/16 0823) Resp:  [14-26] 16 (06/16 0823) BP: (116-157)/(68-112) 130/73 (06/16 0823) SpO2:  [94 %-100 %] 100 % (06/16 0823) Weight:  [65.3 kg] 65.3 kg (06/16 0000)   Gen: In bed, comfortable, thin but no acute distress Resp: non-labored breathing, no grossly audible wheezing Cardiac: Perfusing extremities well  Abd: soft, nt  Neuro: MS: Slowed responses, alert, awake, oriented to location/situation, but reports that she is 76, oriented to month and year but not day of the week (reports it is Sunday) CN: Left hemianopia, pupils equal round reactive to light, mild left facial droop, tongue midline, EOM favors the right side but able to cross fully over to the left Motor: Some mild left-sided motor neglect, left deltoid and hip flexion, and knee flexion are 4+/5, otherwise 5/5 Sensory: Intact to light touch DTR: Diminished patellar and  brachioradialis reflexes, 1+ plus biceps.  Right toe is downgoing and left toe is mute  Pertinent Data:  Basic Metabolic Panel: Recent Labs  Lab 03/09/22 1537 03/10/22 0639  NA 141 139  K 4.1 3.7  CL 108 105  CO2 22 25  GLUCOSE 70 164*  BUN 28* 29*  CREATININE 0.95 0.93  CALCIUM 9.5 9.4    CBC: Recent Labs  Lab 03/09/22 1537 03/10/22 0639  WBC 8.2 4.8  HGB 12.0 10.8*  HCT 36.4 32.0*  MCV 101.7* 98.2  PLT 238 236    Coagulation Studies: Recent Labs    03/09/22 1734 03/10/22 0639  LABPROT 13.9 13.8  INR 1.1 1.1    Ct Chest/Ab/Pelvis w/ contrast 1. No evidence of primary or metastatic disease within the chest, abdomen, and pelvis. 2. Moderate multi-vessel coronary artery calcification. 3. 9 mm indeterminate nodule within the inferomedial right breast, not well characterized on this examination. Correlation with dedicated breast imaging is recommended.  MRI brain personally reviewed, agree with radiology:   1. 3.0 x 2.3 x 1.8 cm irregular enhancing mass involving the right parietal lobe with localized vasogenic edema. 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 A primary CNS neoplasm would be the primary differential consideration. 2. Mild diffusion signal abnormality involving the cortical gray matter about the right parietal region and right thalamus, suspected to reflect superimposed changes of seizure. Correlation with history and EEG suggested. A possible small evolving subacute ischemic infarct at the right thalamus could be considered in the correct clinical setting, although  is felt to be less likely. 3. Underlying age-related cerebral atrophy with moderately advanced chronic microvascular ischemic disease.   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  Residual symptoms are likely secondary to mass and edema with potential component of postictal Todd's phenomenon.  Expect  improvement with steroids and seizure control   Recommendations: - Appreciate neurosurgery eval, patient given 10 mg decadron x 1, now on decadron 4 mg q12hr - s/p Keppra 2000 mg load, now on 500 mg BID. Adjust as needed for renal function:  Estimated Creatinine Clearance: 45.8 mL/min (by C-G formula based on SCr of 0.93 mg/dL).   CrCl 80 to 130 mL/minute/1.73 m2: 500 mg to 1.5 g every 12 hours.  CrCl 50 to <80 mL/minute/1.73 m2: 500 mg to 1 g every 12 hours.  CrCl 30 to <50 mL/minute/1.73 m2: 250 to 750 mg every 12 hours.  CrCl 15 to <30 mL/minute/1.73 m2: 250 to 500 mg every 12 hours.  CrCl <15 mL/minute/1.73 m2: 250 to 500 mg every 24 hours (expert opinion). - EEG pending - Seizure precautions reviewed but will need to be reiterated given persistent confusion - Neurology will follow for seizure control and EEG   Standard seizure precautions: Per [facility] statutes, patients with seizures are not allowed to drive until  they have been seizure-free for six months. Use caution when using heavy equipment or power tools. Avoid working on ladders or at heights. Take showers instead of baths. Ensure the water temperature is not too high on the home water heater. Do not go swimming alone. When caring for infants or small children, sit down when holding, feeding, or changing them to minimize risk of injury to the child in the event you have a seizure.  To reduce risk of seizures, maintain good sleep hygiene avoid alcohol and illicit drug use, take all anti-seizure medications as prescribed.    Lesleigh Noe MD-PhD [facility] [phone]

## 2022-03-10 NOTE — Procedures (Signed)
Patient Name: Tabitha Ramirez  MRN: [id number]  Epilepsy Attending: Lora Havens  Referring Physician/Provider: Rosalin Hawking, MD  Date: 03/10/2022 Duration: 32.07 mins  Patient history:  76 y.o. 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 EEG to evaluate for seizure  Level of alertness: Awake, asleep  AEDs during EEG study: LEV  Technical aspects: This EEG study was done with scalp electrodes positioned according to the 10-20 International system of electrode placement. Electrical activity was acquired at a sampling rate of '500Hz'$  and reviewed with a high frequency filter of '70Hz'$  and a low frequency filter of '1Hz'$ . EEG data were recorded continuously and digitally stored.   Description: The posterior dominant rhythm consists of 8 Hz activity of moderate voltage (25-35 uV) seen predominantly in posterior head regions, symmetric and reactive to eye opening and eye closing. Sleep was characterized by vertex waves, sleep spindles (12 to 14 Hz), maximal frontocentral region. EEG showed continuous generalized and lateralized right hemisphere 3 to 6 Hz theta-delta slowing which at times appears sharply contoured. Physiologic photic driving was not seen during photic stimulation.  Hyperventilation was not performed.     ABNORMALITY - Continuous slow, generalized and lateralized right hemisphere  IMPRESSION: This study is suggestive of cortical dysfunction in right hemisphere likely secondary to underlying structural abnormality.  Additionally there is mild to moderate diffuse encephalopathy, nonspecific etiology.  No seizures or epileptiform discharges were seen throughout the recording.  Betsey Sossamon Barbra Sarks

## 2022-03-10 NOTE — H&P (View-Only) (Signed)
Referring Physician:  No referring provider defined for this encounter.  Primary Physician:  [facility]  Chief Complaint:  possible seizure, new intracranial mass  History of Present Illness: 03/10/2022 Tabitha Ramirez is a 76 y.o. female who presents with the chief complaint of witness event yesterday around 44.  She is right-handed.  The patient cannot provide full history, but her daughter and son-in-law, with whom she lives, reports that she had bilateral hand shaking and difficulty with getting dressed around 2:45 PM.  She was confused and could not focus.  By the time she was brought to the emergency department, she had not recovered to her baseline.  She has history of breast cancer treated in 2014, but has had no recurrences since that time.  She has previously been treated in [place].  She denies headache at this time and does report that she feels better today than yesterday.  She is not at her baseline.  Review of Systems:  A 10 point review of systems is negative, except for the pertinent positives and negatives detailed in the HPI.  Past Medical History: Past Medical History:  Diagnosis Date   Allergy    Anemia    Arthritis    "joints" (09/29/2013)   Breast cancer ([place])    GERD (gastroesophageal reflux disease)    Heart murmur    History of blood transfusion    "related to chemo/breast cancer" (09/29/2013)   Hypertension    Migraines    Personal history of chemotherapy    Personal history of radiation therapy    Radiation 11/05/13-12/24/13   Right breast    TMJ (temporomandibular joint syndrome)    "left; just dx'd" (09/29/2013    Past Surgical History: Past Surgical History:  Procedure Laterality Date   AXILLARY LYMPH NODE BIOPSY Right 03/10/2013   Procedure: AXILLARY LYMPH NODE BIOPSY;  Surgeon: Adin Hector, MD;  Location: [facility];  Service: General;  Laterality: Right;  sentinel node with blue dye   BREAST BIOPSY     BREAST LUMPECTOMY  Right 09/29/2013   needle localization w/axillary LND/notes 09/29/2013   BREAST LUMPECTOMY WITH NEEDLE LOCALIZATION AND AXILLARY LYMPH NODE DISSECTION Right 09/29/2013   Procedure: RIGHT BREAST NEEDLE LOCALIZED LUMPECTOMY AND AXILLARY LYMPH NODE DISSECTION;  Surgeon: Adin Hector, MD;  Location: [facility];  Service: General;  Laterality: Right;   COLONOSCOPY N/A 04/30/2013   Procedure: COLONOSCOPY;  Surgeon: Wonda Horner, MD;  Location: [facility];  Service: Endoscopy;  Laterality: N/A;   ESOPHAGOGASTRODUODENOSCOPY N/A 04/28/2013   Procedure: ESOPHAGOGASTRODUODENOSCOPY (EGD);  Surgeon: Wonda Horner, MD;  Location: [facility];  Service: Endoscopy;  Laterality: N/A;   MASTECTOMY Right 09/29/2013   "partial"   PORT-A-CATH REMOVAL  09/29/2013   PORT-A-CATH REMOVAL Left 09/29/2013   Procedure: REMOVAL PORT-A-CATH;  Surgeon: Adin Hector, MD;  Location: [facility];  Service: General;  Laterality: Left;   PORTACATH PLACEMENT N/A 03/10/2013   Procedure: INSERTION PORT-A-CATH WITH FLUOROSCOPY AND ULTRASOUND;  Surgeon: Adin Hector, MD;  Location: [facility];  Service: General;  Laterality: N/A;   SURAL NERVE BX Right 05/13/2014   Procedure: SURAL NERVE BIOPSY;  Surgeon: Hosie Spangle, MD;  Location: [facility];  Service: Neurosurgery;  Laterality: Right;  Right sural nerve biopsy   TONSILLECTOMY     TOTAL ABDOMINAL HYSTERECTOMY     With bilateral salpingo-oophorectomy    Allergies: Allergies as of 03/09/2022 - Review Complete 03/09/2022  Allergen Reaction Noted   Penicillins Rash  04/09/2008    Medications:  Current Facility-Administered Medications:    0.9 %  sodium chloride infusion, , Intravenous, Continuous, Sreenath, Sudheer B, MD, Last Rate: 75 mL/hr at 03/09/22 2333, New Bag at 03/09/22 2333   acetaminophen (TYLENOL) tablet 1,000 mg, 1,000 mg, Oral, Q6H PRN, Sreenath, Sudheer B, MD   albuterol (PROVENTIL) (2.5 MG/3ML) 0.083% nebulizer solution 2.5 mg, 2.5 mg, Nebulization, Q2H PRN, Sreenath,  Sudheer B, MD   dexamethasone (DECADRON) injection 4 mg, 4 mg, Intravenous, Q12H, Sreenath, Sudheer B, MD   ferrous sulfate tablet 325 mg, 325 mg, Oral, Q breakfast, Sreenath, Sudheer B, MD   haloperidol lactate (HALDOL) injection 1 mg, 1 mg, Intravenous, Once, Sharion Settler, NP   hydrALAZINE (APRESOLINE) injection 10 mg, 10 mg, Intravenous, Q4H PRN, Sreenath, Sudheer B, MD   HYDROcodone-acetaminophen (NORCO/VICODIN) 5-325 MG per tablet 1-2 tablet, 1-2 tablet, Oral, Q4H PRN, Sreenath, Sudheer B, MD   levETIRAcetam (KEPPRA) IVPB 500 mg/100 mL premix, 500 mg, Intravenous, Q12H, Sreenath, Sudheer B, MD, Last Rate: 400 mL/hr at 03/10/22 0552, 500 mg at 03/10/22 4920   levothyroxine (SYNTHROID) tablet 50 mcg, 50 mcg, Oral, Daily, Sreenath, Sudheer B, MD   LORazepam (ATIVAN) injection 2 mg, 2 mg, Intravenous, Q4H PRN, Priscella Mann, Sudheer B, MD   LORazepam (ATIVAN) tablet 0.5 mg, 0.5 mg, Oral, QHS PRN, Priscella Mann, Sudheer B, MD, 0.5 mg at 03/09/22 2336   morphine (PF) 2 MG/ML injection 2 mg, 2 mg, Intravenous, Q2H PRN, Sreenath, Sudheer B, MD   ondansetron (ZOFRAN) tablet 4 mg, 4 mg, Oral, Q6H PRN **OR** ondansetron (ZOFRAN) injection 4 mg, 4 mg, Intravenous, Q6H PRN, Priscella Mann, Sudheer B, MD, 4 mg at 03/09/22 2334   oxyCODONE-acetaminophen (PERCOCET/ROXICET) 5-325 MG per tablet 1 tablet, 1 tablet, Oral, Q8H PRN, Priscella Mann, Sudheer B, MD   vitamin B-12 (CYANOCOBALAMIN) tablet 1,000 mcg, 1,000 mcg, Oral, Daily, Sreenath, Sudheer B, MD   Social History: Social History   Tobacco Use   Smoking status: Former    Years: 1.00    Types: Cigarettes   Smokeless tobacco: Never   Tobacco comments:    smoked in college 1 yr   Substance Use Topics   Alcohol use: No    Comment: Wine occasionally   Drug use: No    Family Medical History: Family History  Problem Relation Age of Onset   Cancer Mother 24       Unknown type of cancer   Congestive Heart Failure Mother    Breast cancer Mother    Colon cancer  Brother    Diabetes Brother 76   Heart disease Brother        AMI 05/22/2012   Congestive Heart Failure Brother    Cancer Maternal Grandmother 70       Unknown type of cancer   Breast cancer Maternal Grandmother     Physical Examination: Vitals:   03/09/22 2230 03/10/22 0408  BP: (!) 157/69 116/68  Pulse: 82 65  Resp: 18 15  Temp: 97.8 F (36.6 C) 98.2 F (36.8 C)  SpO2: 100% 99%     General: Patient is well developed, well nourished, calm, collected, and in no apparent distress.  Psychiatric: Patient is non-anxious.  Head:  Pupils equal, round, and reactive to light.  ENT:  Oral mucosa appears well hydrated.  Neck:   Supple.  Full range of motion.  Respiratory: Patient is breathing without any difficulty.  Extremities: No edema.  Vascular: Palpable pulses in dorsal pedal vessels.  Skin:   On exposed skin, there  are no abnormal skin lesions.  NEUROLOGICAL:  General: In no acute distress.   Awake, alert, oriented to person, place, and time.  Pupils equal round and reactive to light.  Facial tone is symmetric.  Tongue protrusion is midline.  There is no pronator drift.  She speaks very slowly.  She is neglecting the left side.  Evaluation of her visual fields reveals left-sided visual field cut in both eyes.  This is worse in the inferior than superior quadrants, but she is not able to see very well in the superior quadrant on the left side of her vision as well.   Strength: Side Biceps Triceps Deltoid Interossei Grip Wrist Ext. Wrist Flex.  R '5 5 5 5 5 5 5  '$ L '5 5 5 5 5 5 5   '$ Side Iliopsoas Quads Hamstring PF DF EHL  R '5 5 5 5 5 5  '$ L '5 5 5 5 5 5    '$ Bilateral upper and lower extremity sensation is intact to light touch. Reflexes are 1+ and symmetric at the biceps, triceps, brachioradialis, patella and achilles. Hoffman's is absent.  Clonus is not present.  Toes are down-going.    Gait is untested.    Imaging: MRI Brain 03/09/22 IMPRESSION: 1. 3.0 x 2.3  x 1.8 cm irregular enhancing mass involving the right parietal lobe with localized vasogenic edema. 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 A primary CNS neoplasm would be the primary differential consideration. 2. Mild diffusion signal abnormality involving the cortical gray matter about the right parietal region and right thalamus, suspected to reflect superimposed changes of seizure. Correlation with history and EEG suggested. A possible small evolving subacute ischemic infarct at the right thalamus could be considered in the correct clinical setting, although is felt to be less likely. 3. Underlying age-related cerebral atrophy with moderately advanced chronic microvascular ischemic disease.     Electronically Signed   By: Jeannine Boga M.D.   On: 03/09/2022 21:48  CT CAP 03/09/22 IMPRESSION: 1. No evidence of primary or metastatic disease within the chest, abdomen, and pelvis. 2. Moderate multi-vessel coronary artery calcification. 3. 9 mm indeterminate nodule within the inferomedial right breast, not well characterized on this examination. Correlation with dedicated breast imaging is recommended.     Electronically Signed   By: Fidela Salisbury M.D.   On: 03/09/2022 19:03   I have personally reviewed the images and agree with the above interpretation.  Labs:    Latest Ref Rng & Units 03/09/2022    3:37 PM 06/22/2014   11:07 AM 05/12/2014   12:38 PM  CBC  WBC 4.0 - 10.5 K/uL 8.2  6.1  6.6   Hemoglobin 12.0 - 15.0 g/dL 12.0  10.7  10.3   Hematocrit 36.0 - 46.0 % 36.4  32.5  30.4   Platelets 150 - 400 K/uL 238  284  245     Assessment and Plan: Ms. Drennen is a pleasant 76 y.o. 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  This could be a primary glial neoplasm, but appears more consistent with a metastasis in the right parietal lobe.  She has left-sided neglect as well as left-sided  visual field cut.  I advised her that she is unable to drive at this time.  She has remote history of breast cancer but this could represent either a breast cancer metastasis or new diagnosis of metastatic disease of unknown origin.  I agree with oncology consultation, after  which we will discuss whether radiosurgery would be considered without a tissue diagnosis.  If tissue diagnosis is required, I would likely recommend resection of this lesion as biopsy harbors approximately the same risk as open resection.  I will touch base with her hospitalist as well as the oncologist later today so that we can have a multidisciplinary discussion about next steps.    Cayne Yom K. Izora Ribas MD, [facility]  Addendum to above:  I have spoken to Dr. Janese Banks and to the patient's daughter to confirm history.  Based on our discussions, I have recommended craniotomy for tumor resection, as we have no other option for tissue diagnosis.    I discussed the planned procedure at length with the patient's daughter, including the risks, benefits, alternatives, and indications. The risks discussed include but are not limited to bleeding, infection, need for reoperation, spinal fluid leak, stroke, vision loss, anesthetic complication, coma, paralysis, and even death. I also described in detail that improvement was not guaranteed.  The patient's daughter expressed understanding of these risks, and asked that we proceed with surgery. I described the surgery in layman's terms, and gave ample opportunity for questions, which were answered to the best of my ability.  Based on my impression of the patient's sensorium earlier today, she may not have capacity to give consent though I will reassess later today prior to surgery.  Meade Maw

## 2022-03-10 NOTE — Evaluation (Signed)
Physical Therapy Evaluation Patient Details Name: Tabitha Ramirez MRN: [id number] DOB: 27-Jul-1946 Today's Date: 03/10/2022  History of Present Illness  Pt is a 76 year old female admitetd with likely seizure with newly diagnosed lesion  in the right parietal lobe after presenting to the ED with hand shaking, confusion, and disorientation. 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  Oriented to self, location and general situation; follows simple commands, pleasant and cooperative throughout session.  Response time and processing generally delayed, requiring increased time/effort to complete.  Demonstrates decreased functional strength (3- to 4-/5) and control/coordination throughout L hemi-body, with significant sensory deficits throughout.  Generally ataxic with impaired motor control/moderate apraxia noted with both purposeful and spontaneous tasks throughout session.  Appears to have L visual field cut; does attend to L of midline with max/dep cuing from therapist, but does not initiate spontaneously.  Currently requiring mod assist +1-2 for sit/stand, standing balance and gait (15') with bilat HHA.  Extensive assist for midline orientation, L UE/LE positioning and support, dynamic weight shifting throughout tasks.  Patient very motivated and responds well to skilled interventions; good potential to make consistent functional gains with access to appropriate post-acute rehab. Would benefit from skilled PT to address above deficits and promote optimal return to PLOF.; recommend transition to acute inpatient rehab upon discharge for high-intensity, post-acute rehab services.      Recommendations for follow up therapy are one component of a multi-disciplinary  discharge planning process, led by the attending physician.  Recommendations may be updated based on patient status, additional functional criteria and insurance authorization.  Follow Up Recommendations Acute inpatient rehab (3hours/day)    Assistance Recommended at Discharge Frequent or constant Supervision/Assistance  Patient can return home with the following  A lot of help with walking and/or transfers;A lot of help with bathing/dressing/bathroom    Equipment Recommendations    Recommendations for Other Services       Functional Status Assessment Patient has had a recent decline in their functional status and demonstrates the ability to make significant improvements in function in a reasonable and predictable amount of time.     Precautions / Restrictions Precautions Precautions: Fall Precaution Comments: L field cut Restrictions Weight Bearing Restrictions: No      Mobility  Bed Mobility Overal bed mobility: Needs Assistance Bed Mobility: Supine to Sit     Supine to sit: Mod assist, Min assist     General bed mobility comments: transition towards L side of bed    Transfers Overall transfer level: Needs assistance Equipment used: 2 person hand held assist Transfers: Sit to/from Stand Sit to Stand: Mod assist, +2 safety/equipment           General transfer comment: assist for L UE/LE placement, anterior weight translation, lift off and static/dynamic balance; lists L post/lateral with fatigue, divided attention, constant assist from therapist to correct    Ambulation/Gait Ambulation/Gait assistance: Mod assist, +2 safety/equipment Gait Distance (Feet): 15 Feet Assistive device: 2 person hand held assist         General Gait Details: partially reciprocal stepping pattern; inconsistent step height/width with mod ataxia L LE; L lateral lean requiring mod manual facilitation for midline and dynamic weight shift throughout gait cycle.  Maintains R gaze  throughout; minimal awareness of L visual field/environment  Stairs            Wheelchair Mobility    Modified Rankin (Stroke Patients Only)       Balance Overall balance assessment: Needs assistance Sitting-balance support: No upper extremity supported, Feet supported Sitting balance-Leahy Scale: Fair   Postural control: Posterior lean, Left lateral lean Standing balance support: Bilateral upper extremity supported Standing balance-Leahy Scale: Poor                               Pertinent Vitals/Pain Pain Assessment Pain Assessment: No/denies pain    Home Living Family/patient expects to be discharged to:: Private residence Living Arrangements: Children (daughter, son-in-law and 70 year old grandson) Available Help at Discharge: Family;Available PRN/intermittently Type of Home: House Home Access: Stairs to enter Entrance Stairs-Rails: Psychiatric nurse of Steps: 4-5   Home Layout: Able to live on main level with bedroom/bathroom Home Equipment: Tub bench      Prior Function Prior Level of Function : Independent/Modified Independent             Mobility Comments: Ambulatory without assist device; assisted with care for 94 year old grandchild ADLs Comments: pt reports MOD I with ADL, some assist with IADL however generally MOD I     Hand Dominance   Dominant Hand: Right    Extremity/Trunk Assessment   Upper Extremity Assessment Upper Extremity Assessment: LUE deficits/detail (RUE 4+/5 strength throughout) LUE Deficits / Details: LUE AROM shoulder flexion 3/4 full AROM, elbow flexion/extension WFL, wrist flexion/extension WFL; LUE Sensation: decreased light touch;decreased proprioception LUE Coordination: decreased fine motor;decreased gross motor    Lower Extremity Assessment Lower Extremity Assessment: LLE deficits/detail LLE Deficits / Details: L hip and knee grossly 3-/5, ankle 2+/5; ataxic in movement; difficulty with  purposeful termination of muscle activation at times; absent sensory awareness throughout L LE LLE Sensation: decreased light touch       Communication   Communication:  (generally slowed and deliberate in speech at times)  Cognition Arousal/Alertness: Awake/alert Behavior During Therapy: Flat affect Overall Cognitive Status: Impaired/Different from baseline                                 General Comments: Alert and oriented to basic information, follows commands; generally slowed and deliberate in response time; L field cut/inattention        General Comments      Exercises     Assessment/Plan    PT Assessment Patient needs continued PT services  PT Problem List Decreased strength;Decreased range of motion;Decreased activity tolerance;Decreased balance;Decreased coordination;Decreased cognition;Decreased knowledge of use of DME;Decreased safety awareness;Decreased mobility;Decreased knowledge of precautions;Cardiopulmonary status limiting activity       PT Treatment Interventions DME instruction;Gait training;Stair training;Functional mobility training;Therapeutic activities;Therapeutic exercise;Balance training;Patient/family education;Cognitive remediation    PT Goals (Current goals can be found in the Care Plan section)  Acute Rehab PT Goals Patient Stated Goal: to be able to walk by myself again PT Goal Formulation: With patient Time For Goal Achievement: 03/24/22 Potential to Achieve Goals: Good    Frequency 7X/week     Co-evaluation PT/OT/SLP Co-Evaluation/Treatment: Yes Reason for Co-Treatment: Complexity of the patient's impairments (multi-system involvement);Necessary to address cognition/behavior during functional activity;For patient/therapist safety PT goals addressed during session: Mobility/safety with mobility OT goals addressed during session: ADL's and self-care       AM-PAC PT "6 Clicks" Mobility  Outcome Measure  Help needed  turning from your back to your side while in a flat bed without using bedrails?: A Little Help needed moving from lying on your back to sitting on the side of a flat bed without using bedrails?: A Lot Help needed moving to and from a bed to a chair (including a wheelchair)?: A Lot Help needed standing up from a chair using your arms (e.g., wheelchair or bedside chair)?: A Lot Help needed to walk in hospital room?: A Lot Help needed climbing 3-5 steps with a railing? : A Lot 6 Click Score: 13    End of Session Equipment Utilized During Treatment: Gait belt Activity Tolerance: Patient tolerated treatment well Patient left: in chair;with call bell/phone within reach;with chair alarm set Nurse Communication: Mobility status PT Visit Diagnosis: Muscle weakness (generalized) (M62.81);Difficulty in walking, not elsewhere classified (R26.2);Hemiplegia and hemiparesis Hemiplegia - Right/Left: Left Hemiplegia - dominant/non-dominant: Non-dominant Hemiplegia - caused by: Other cerebrovascular disease    Time: 0943-1010 PT Time Calculation (min) (ACUTE ONLY): 27 min   Charges:   PT Evaluation $PT Eval Moderate Complexity: 1 Mod         Karlyn Glasco H. Owens Shark, PT, DPT, NCS 03/10/22, 2:50 PM [phone]

## 2022-03-10 NOTE — Progress Notes (Signed)
PROGRESS NOTE    Tabitha Ramirez  DUK:[id number] DOB: 19-Jun-1946 DOA: 03/09/2022 PCP: [facility]    Brief Narrative:   76 y.o. 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  Patient unable to provide history so majority of history gained from patient's son-in-law at bedside.   Patient last seen well at 2:45 PM on the day of admission.  Patient was dressing to pick up her grandchildren when she noted the hand shaking and could not get dressed.  Patient was found to be confused and presented to the emergency room.  Patient did present with left facial droop, left arm and leg drift, decree sensation with ataxia.   CT head showed right frontal parietal mass lesion likely metastases.  Neurology was called by EDP.  Code stroke was initially called.  Was canceled after CT findings were known.  Neurosurgery was contacted by EDP who recommended Decadron for vasogenic edema associated with presumptive cerebral metastasis.   Given the high likelihood of seizure as a result of the mass patient was loaded with Keppra 2000 mg in ED.  No further seizure activity noted  6/16: No clinical status changes overnight.  Slurred speech improving.  Patient remains somewhat confused.  Case discussed with neurosurgery Dr. Cari Caraway who discussed with primary oncologist.  Plan for craniotomy for tumor resection and tissue diagnosis.  Patient being consented for procedure today.    Assessment & Plan:   Principal Problem:   Brain mass Active Problems:   Essential hypertension, benign   Anemia   Seizure (HCC)  New brain mass with vasogenic edema Confusion Weakness This likely represents metastasis MRI brain with solitary mass consistent with CT CT chest abdomen pelvis with no evidence of  metastatic disease Oncology, neurology, neurosurgery engaged Plan: Neurosurgery directly discussed case with oncology.  Plan for craniotomy for open resection and tissue diagnosis today.  We will continue IV Decadron.  Frequent neurochecks.   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  Neurosurgery discussed with patient's daughter Disposition Plan: Status is: Inpatient Remains inpatient appropriate because: New brain mass, suspected metastasis.  Plan for inpatient craniotomy today   Level of care: Med-Surg  Consultants:  Neurosurgery Neurology Oncology Palliative care  Procedures:  Craniotomy plan 6/16  Antimicrobials: None   Subjective: Seen and examined.  Still confused but speech less dysarthric today.  Objective: Vitals:   03/09/22 2230 03/10/22 0000 03/10/22 0408 03/10/22 0823  BP: (!) 157/69  116/68 130/73  Pulse: 82  65 (!) 58  Resp: '18  15 16  '$ Temp: 97.8 F (36.6 C)  98.2 F (36.8 C) 98.1 F (36.7 C)  TempSrc: Oral   Oral  SpO2: 100%  99% 100%  Weight:  65.3 kg    Height:  '5\' 4"'$  (1.626 m)  Intake/Output Summary (Last 24 hours) at 03/10/2022 1042 Last data filed at 03/10/2022 0300 Gross per 24 hour  Intake 329.26 ml  Output --  Net 329.26 ml   Filed Weights   03/10/22 0000  Weight: 65.3 kg    Examination:  General exam: No  acute distress.  Flattened affect.  Dysarthric speech Respiratory system: Clear to auscultation. Respiratory effort normal. Cardiovascular system: S1-S2, RRR, no murmurs, no pedal edema Gastrointestinal system: Thin, soft, NT/ND, normal bowel sounds Central nervous system: Alert and oriented.  Left-sided weakness Extremities: Symmetric 5 x 5 power.  Decreased power on left Skin: No rashes, lesions or ulcers Psychiatry: Judgement and insight appear impaired. Mood & affect confused.     Data Reviewed: I have personally reviewed following labs and imaging studies  CBC: Recent Labs  Lab 03/09/22 1537 03/10/22 0639  WBC 8.2 4.8  HGB 12.0 10.8*  HCT 36.4 32.0*  MCV 101.7* 98.2  PLT 238 308   Basic Metabolic Panel: Recent Labs  Lab 03/09/22 1537 03/10/22 0639  NA 141 139  K 4.1 3.7  CL 108 105  CO2 22 25  GLUCOSE 70 164*  BUN 28* 29*  CREATININE 0.95 0.93  CALCIUM 9.5 9.4   GFR: Estimated Creatinine Clearance: 45.8 mL/min (by C-G formula based on SCr of 0.93 mg/dL). Liver Function Tests: Recent Labs  Lab 03/09/22 1537  AST 33  ALT 27  ALKPHOS 84  BILITOT 1.2  PROT 8.3*  ALBUMIN 4.1   No results for input(s): "LIPASE", "AMYLASE" in the last 168 hours. No results for input(s): "AMMONIA" in the last 168 hours. Coagulation Profile: Recent Labs  Lab 03/09/22 1734 03/10/22 0639  INR 1.1 1.1   Cardiac Enzymes: Recent Labs  Lab 03/10/22 0639  CKTOTAL 116   BNP (last 3 results) No results for input(s): "PROBNP" in the last 8760 hours. HbA1C: No results for input(s): "HGBA1C" in the last 72 hours. CBG: No results for input(s): "GLUCAP" in the last 168 hours. Lipid Profile: No results for input(s): "CHOL", "HDL", "LDLCALC", "TRIG", "CHOLHDL", "LDLDIRECT" in the last 72 hours. Thyroid Function Tests: No results for input(s): "TSH", "T4TOTAL", "FREET4", "T3FREE", "THYROIDAB" in the last 72 hours. Anemia Panel: No results for input(s): "VITAMINB12", "FOLATE",  "FERRITIN", "TIBC", "IRON", "RETICCTPCT" in the last 72 hours. Sepsis Labs: No results for input(s): "PROCALCITON", "LATICACIDVEN" in the last 168 hours.  No results found for this or any previous visit (from the past 240 hour(s)).       Radiology Studies: MR BRAIN W WO CONTRAST  Result Date: 03/09/2022 CLINICAL DATA:  Initial evaluation for metastatic disease. EXAM: MRI HEAD WITHOUT AND WITH CONTRAST TECHNIQUE: Multiplanar, multiecho pulse sequences of the brain and surrounding structures were obtained without and with intravenous contrast. CONTRAST:  38m GADAVIST GADOBUTROL 1 MMOL/ML IV SOLN COMPARISON:  Prior CT from earlier the same day. FINDINGS: Brain: Age-related cerebral atrophy. Patchy and confluent T2/FLAIR hyperintensity involving the periventricular and deep white matter both cerebral hemispheres as well as the pons, most consistent with chronic small vessel ischemic disease, moderately advanced in nature. Irregular enhancing mass measuring 3.0 x 2.3 x 1.8 cm seen along the gray-white matter differentiation of the right parietal lobe (AP by craniocaudad by transverse). Surrounding T2/FLAIR signal intensity consistent with vasogenic edema without significant regional mass effect. Few scattered foci of internal susceptibility artifact consistent with blood products and/or necrosis. Findings most concerning for a possible metastatic lesion. Primary CNS neoplasm would be the primary differential consideration. No other definite lesions or  abnormal enhancement elsewhere within the brain. No other mass effect or midline shift. Additionally, there is mildly increased diffusion signal abnormality involving the cortical gray matter about the right parietal region (series 5, image 31). Overall appearance is suggestive of possible superimposed changes of seizure. Similarly, mild diffusion signal abnormality seen at the right thalamus (series 5, image 23). This could also be related to seizure. A  possible small evolving subacute ischemic infarct could also be considered. No significant associated enhancement about these locations. No other evidence for acute or subacute ischemia. Gray-white matter differentiation otherwise maintained. No other areas of chronic cortical infarction. No other acute or chronic intracranial blood products. Ventricles normal size without hydrocephalus. No extra-axial fluid collection. Pituitary gland suprasellar region normal. Vascular: Major intracranial vascular flow voids are maintained. Asymmetric FLAIR signal intensity involving the left transverse and sigmoid sinus without definite T1 correlate, likely slow/sluggish flow. Skull and upper cervical spine: Craniocervical junction with normal limits. Bone marrow signal intensity mildly heterogeneous without focal marrow replacing lesion. No scalp soft tissue abnormality. Sinuses/Orbits: Globes and orbital soft tissues within normal limits. Scattered mucosal thickening noted about the ethmoidal air cells and maxillary sinuses. Superimposed air-fluid level within the right maxillary sinus, consistent with acute sinusitis. Other: No mastoid effusion. IMPRESSION: 1. 3.0 x 2.3 x 1.8 cm irregular enhancing mass involving the right parietal lobe with localized vasogenic edema. 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 A primary CNS neoplasm would be the primary differential consideration. 2. Mild diffusion signal abnormality involving the cortical gray matter about the right parietal region and right thalamus, suspected to reflect superimposed changes of seizure. Correlation with history and EEG suggested. A possible small evolving subacute ischemic infarct at the right thalamus could be considered in the correct clinical setting, although is felt to be less likely. 3. Underlying age-related cerebral atrophy with moderately advanced chronic microvascular ischemic disease. Electronically Signed   By: Jeannine Boga M.D.   On: 03/09/2022 21:48   CT CHEST ABDOMEN PELVIS W CONTRAST  Result Date: 03/09/2022 CLINICAL DATA:  Brain metastases, unknown primary EXAM: CT CHEST, ABDOMEN, AND PELVIS WITH CONTRAST TECHNIQUE: Multidetector CT imaging of the chest, abdomen and pelvis was performed following the standard protocol during bolus administration of intravenous contrast. RADIATION DOSE REDUCTION: This exam was performed according to the departmental dose-optimization program which includes automated exposure control, adjustment of the mA and/or kV according to patient size and/or use of iterative reconstruction technique. CONTRAST:  172m OMNIPAQUE IOHEXOL 300 MG/ML  SOLN COMPARISON:  None Available. FINDINGS: CT CHEST FINDINGS Cardiovascular: Moderate multi-vessel coronary artery calcification. Global cardiac size within normal limits. No pericardial effusion. Central pulmonary arteries are of normal caliber. Mild atherosclerotic calcification within the thoracic aorta. No aortic aneurysm Mediastinum/Nodes: No enlarged mediastinal, hilar, or axillary lymph nodes. Thyroid gland, trachea, and esophagus demonstrate no significant findings. Lungs/Pleura: Ground-glass opacity with associated traction bronchiolectasis within the right apex likely represents focal fibrotic change. Subpleural fibrotic change within the a anterior right upper lobe and right middle lobe likely represents post radiation change. The lungs are otherwise clear. No pneumothorax or pleural effusion. Central airways are widely patent. Musculoskeletal: No acute bone abnormality. No lytic or blastic bone lesion. Partial right mastectomy and axillary lymph node dissection has been performed. A indeterminate 9 mm nodule is seen within the a inferomedial right breast, not well characterized on this examination. CT ABDOMEN PELVIS FINDINGS Hepatobiliary: No focal liver abnormality is seen. No gallstones, gallbladder wall thickening, or biliary dilatation.  Pancreas:  Unremarkable Spleen: Unremarkable Adrenals/Urinary Tract: Adrenal glands are unremarkable. Kidneys are normal, without renal calculi, focal lesion, or hydronephrosis. Bladder is unremarkable. Stomach/Bowel: Stomach is within normal limits. Appendix appears normal. No evidence of bowel wall thickening, distention, or inflammatory changes. Vascular/Lymphatic: Aortic atherosclerosis. No enlarged abdominal or pelvic lymph nodes. Reproductive: Status post hysterectomy. No adnexal masses. Other: No abdominal wall hernia.  Rectum unremarkable. Musculoskeletal: No acute bone abnormality within the abdomen and pelvis. No lytic or blastic bone lesion. IMPRESSION: 1. No evidence of primary or metastatic disease within the chest, abdomen, and pelvis. 2. Moderate multi-vessel coronary artery calcification. 3. 9 mm indeterminate nodule within the inferomedial right breast, not well characterized on this examination. Correlation with dedicated breast imaging is recommended. Electronically Signed   By: Fidela Salisbury M.D.   On: 03/09/2022 19:03   CT HEAD CODE STROKE WO CONTRAST`  Result Date: 03/09/2022 CLINICAL DATA:  Code stroke. Mental status change, unknown cause. Left-sided weakness. Headache. EXAM: CT HEAD WITHOUT CONTRAST TECHNIQUE: Contiguous axial images were obtained from the base of the skull through the vertex without intravenous contrast. RADIATION DOSE REDUCTION: This exam was performed according to the departmental dose-optimization program which includes automated exposure control, adjustment of the mA and/or kV according to patient size and/or use of iterative reconstruction technique. COMPARISON:  No pertinent prior exams available for comparison. FINDINGS: Brain: Mild generalized parenchymal atrophy. 2.3 x 1.6 cm hyperdense mass within the right parietal lobe with mild surrounding vasogenic edema (for instance as seen on series 5, image 43). Background moderate patchy and ill-defined hypoattenuation  within the cerebral white matter, nonspecific but compatible with chronic small vessel ischemic disease. There is no acute intracranial hemorrhage. No demarcated cortical infarct. No extra-axial fluid collection. No midline shift. Vascular: No hyperdense vessel. Atherosclerotic calcifications. Skull: No fracture or aggressive osseous lesion. Sinuses/Orbits: No mass or acute finding within the imaged orbits. Fluid within the inferior left frontal sinus. Minimal mucosal thickening within the bilateral ethmoid and sphenoid sinuses at the imaged levels. Other: Bilateral TMJ osteoarthrosis. ASPECTS ([place] Stroke Program Early CT Score) - Ganglionic level infarction (caudate, lentiform nuclei, internal capsule, insula, M1-M3 cortex): 7 - Supraganglionic infarction (M4-M6 cortex): 3 Total score (0-10 with 10 being normal): 10 These results were called by telephone at the time of interpretation on 03/09/2022 at 5:10 pm to provider [facility] , who verbally acknowledged these results. IMPRESSION: 2.3 x 1.6 cm hyperdense mass within the right parietal lobe with mild surrounding vasogenic edema. This is most suspicious for an intracranial metastasis. A brain MRI without and with contrast is recommended for further evaluation. No evidence of acute infarct or acute intracranial hemorrhage. Moderate chronic small vessel ischemic changes within the cerebral white matter. Mild generalized parenchymal atrophy. Paranasal sinus disease, as described. Electronically Signed   By: Kellie Simmering D.O.   On: 03/09/2022 17:15        Scheduled Meds:  dexamethasone (DECADRON) injection  4 mg Intravenous Q12H   ferrous sulfate  325 mg Oral Q breakfast   haloperidol lactate  1 mg Intravenous Once   levothyroxine  50 mcg Oral Daily   vitamin B-12  1,000 mcg Oral Daily   Continuous Infusions:  levETIRAcetam 500 mg (03/10/22 0552)     LOS: 1 day    Sidney Ace, MD [facility]   If 7PM-7AM, please contact  night-coverage  03/10/2022, 10:42 AM

## 2022-03-10 NOTE — Consult Note (Signed)
Hematology/Oncology Consult note [facility] Telephone:([phone] [phone]  Patient Care Team: [place], [place] [facility] as PCP - General (Family Medicine) Magrinat, Virgie Dad, MD (Inactive) as Consulting Physician (Oncology)   Name of the patient: Tabitha Ramirez  [id number]  [birth year]    Reason for consult: History of breast cancer now presenting with brain mass   Requesting physician: Dr. Roanna Epley  Date of visit:03/10/2022    History of presenting illness- Patient is a 76 year old female with history of stage I right breast cancer in 2014 that was weakly ER/PR positive and HER2 negative.  She received neoadjuvant chemotherapy with AC Taxol back then followed by surgery and adjuvant radiation therapy and has been in remission since then.  She was admitted to the hospital with symptoms of hand shaking and difficulty getting dressed for 1 day.  She was also acutely confused.  She was brought to the hospital and CT as well as MRI shows a solitary 3 x 2.3 x 1.8 cm enhancing mass along the right parietal lobe with surrounding vasogenic edema and regional mass effect.  This is concerning for possible metastatic lesion although primary CNS neoplasm is in the differential.  CT chest abdomen pelvis with contrast did not show any evidence of metastatic disease other than a 9 mm indeterminate nodule in the right breast.  Neurosurgery is currently on board and patient is on IV steroids  Patient lives with her daughter and has been independent of her ADLs and IADLs prior to acute events.    Review of systems- Review of Systems  Constitutional:  Positive for malaise/fatigue.    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   Gait abnormality 01/26/2014    Unspecified hereditary and idiopathic peripheral neuropathy 01/26/2014   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[place]) 01/20/2014   Neuropathic pain 01/20/2014   Anxiety 01/20/2014   Unspecified deficiency anemia 01/20/2014   Hx of neutropenia 08/04/2013   Jaw pain 08/04/2013   Leukocytosis 04/27/2013   Pericardial effusion 04/27/2013   Cancer of central portion of right female breast ([place]) 02/04/2013   Need for influenza vaccination 11/29/2011   Hypertriglyceridemia 03/07/2011   Post-menopausal 03/07/2011   Anemia 12/17/2010   Heart murmur 12/17/2010   Seasonal allergies 12/17/2010   HYPOKALEMIA 02/19/2009   Essential hypertension, benign 04/09/2008     Past Medical History:  Diagnosis Date   Allergy    Anemia    Arthritis    "joints" (09/29/2013)   Breast cancer ([place])    GERD (gastroesophageal reflux disease)    Heart murmur    History of blood transfusion    "related to chemo/breast cancer" (09/29/2013)   Hypertension    Migraines    Personal history of chemotherapy    Personal history of radiation therapy    Radiation 11/05/13-12/24/13   Right breast    TMJ (temporomandibular joint syndrome)    "left; just dx'd" (09/29/2013     Past Surgical History:  Procedure Laterality Date   AXILLARY LYMPH NODE BIOPSY Right 03/10/2013   Procedure: AXILLARY LYMPH NODE BIOPSY;  Surgeon: Adin Hector, MD;  Location: [facility];  Service: General;  Laterality: Right;  sentinel node with blue dye   BREAST BIOPSY     BREAST LUMPECTOMY Right 09/29/2013   needle localization w/axillary LND/notes 09/29/2013  BREAST LUMPECTOMY WITH NEEDLE LOCALIZATION AND AXILLARY LYMPH NODE DISSECTION Right 09/29/2013   Procedure: RIGHT BREAST NEEDLE LOCALIZED LUMPECTOMY AND AXILLARY LYMPH NODE DISSECTION;  Surgeon: Adin Hector, MD;  Location: [facility];  Service: General;  Laterality: Right;   COLONOSCOPY N/A 04/30/2013   Procedure: COLONOSCOPY;  Surgeon: Wonda Horner, MD;  Location: [facility];  Service:  Endoscopy;  Laterality: N/A;   ESOPHAGOGASTRODUODENOSCOPY N/A 04/28/2013   Procedure: ESOPHAGOGASTRODUODENOSCOPY (EGD);  Surgeon: Wonda Horner, MD;  Location: [facility];  Service: Endoscopy;  Laterality: N/A;   MASTECTOMY Right 09/29/2013   "partial"   PORT-A-CATH REMOVAL  09/29/2013   PORT-A-CATH REMOVAL Left 09/29/2013   Procedure: REMOVAL PORT-A-CATH;  Surgeon: Adin Hector, MD;  Location: [facility];  Service: General;  Laterality: Left;   PORTACATH PLACEMENT N/A 03/10/2013   Procedure: INSERTION PORT-A-CATH WITH FLUOROSCOPY AND ULTRASOUND;  Surgeon: Adin Hector, MD;  Location: [facility];  Service: General;  Laterality: N/A;   SURAL NERVE BX Right 05/13/2014   Procedure: SURAL NERVE BIOPSY;  Surgeon: Hosie Spangle, MD;  Location: [facility];  Service: Neurosurgery;  Laterality: Right;  Right sural nerve biopsy   TONSILLECTOMY     TOTAL ABDOMINAL HYSTERECTOMY     With bilateral salpingo-oophorectomy    Social History   Socioeconomic History   Marital status: Divorced    Spouse name: n/a   Number of children: 1   Years of education: 17   Highest education level: Not on file  Occupational History   Occupation: retired    Comment: school Associate Professor   Occupation: subsitute   Tobacco Use   Smoking status: Former    Years: 1.00    Types: Cigarettes   Smokeless tobacco: Never   Tobacco comments:    smoked in college 1 yr   Substance and Sexual Activity   Alcohol use: No    Comment: Wine occasionally   Drug use: No   Sexual activity: Not Currently    Partners: Male    Birth control/protection: Surgical  Other Topics Concern   Not on file  Social History Narrative   Retired, worked for [employer] system in [employer] in 7/08. Currently work as Oceanographer   Social Determinants of Radio broadcast assistant Strain: Not on file  Food Insecurity: Not on file  Transportation Needs: Not on file  Physical Activity: Not on file  Stress: Not on file   Social Connections: Not on file  Intimate Partner Violence: Not on file     Family History  Problem Relation Age of Onset   Cancer Mother 70       Unknown type of cancer   Congestive Heart Failure Mother    Breast cancer Mother    Colon cancer Brother    Diabetes Brother 25   Heart disease Brother        AMI 05/22/2012   Congestive Heart Failure Brother    Cancer Maternal Grandmother 70       Unknown type of cancer   Breast cancer Maternal Grandmother      Current Facility-Administered Medications:    acetaminophen (TYLENOL) tablet 1,000 mg, 1,000 mg, Oral, Q6H PRN, Sreenath, Sudheer B, MD   albuterol (PROVENTIL) (2.5 MG/3ML) 0.083% nebulizer solution 2.5 mg, 2.5 mg, Nebulization, Q2H PRN, Sreenath, Sudheer B, MD   dexamethasone (DECADRON) injection 4 mg, 4 mg, Intravenous, Q12H, Sreenath, Sudheer B, MD, 4 mg at 03/10/22 0904   ferrous sulfate tablet 325 mg, 325  mg, Oral, Q breakfast, Sreenath, Sudheer B, MD, 325 mg at 03/10/22 0857   haloperidol lactate (HALDOL) injection 1 mg, 1 mg, Intravenous, Once, Sharion Settler, NP   hydrALAZINE (APRESOLINE) injection 10 mg, 10 mg, Intravenous, Q4H PRN, Sreenath, Sudheer B, MD   HYDROcodone-acetaminophen (NORCO/VICODIN) 5-325 MG per tablet 1-2 tablet, 1-2 tablet, Oral, Q4H PRN, Priscella Mann, Sudheer B, MD   levETIRAcetam (KEPPRA) IVPB 500 mg/100 mL premix, 500 mg, Intravenous, Q12H, Sreenath, Sudheer B, MD, Last Rate: 400 mL/hr at 03/10/22 0552, 500 mg at 03/10/22 4462   levothyroxine (SYNTHROID) tablet 50 mcg, 50 mcg, Oral, Daily, Priscella Mann, Sudheer B, MD, 50 mcg at 03/10/22 0857   LORazepam (ATIVAN) injection 2 mg, 2 mg, Intravenous, Q4H PRN, Priscella Mann, Sudheer B, MD   LORazepam (ATIVAN) tablet 0.5 mg, 0.5 mg, Oral, QHS PRN, Priscella Mann, Sudheer B, MD, 0.5 mg at 03/09/22 2336   morphine (PF) 2 MG/ML injection 2 mg, 2 mg, Intravenous, Q2H PRN, Sreenath, Sudheer B, MD   ondansetron (ZOFRAN) tablet 4 mg, 4 mg, Oral, Q6H PRN **OR** ondansetron  (ZOFRAN) injection 4 mg, 4 mg, Intravenous, Q6H PRN, Priscella Mann, Sudheer B, MD, 4 mg at 03/09/22 2334   oxyCODONE-acetaminophen (PERCOCET/ROXICET) 5-325 MG per tablet 1 tablet, 1 tablet, Oral, Q8H PRN, Priscella Mann, Sudheer B, MD   vitamin B-12 (CYANOCOBALAMIN) tablet 1,000 mcg, 1,000 mcg, Oral, Daily, Priscella Mann, Sudheer B, MD, 1,000 mcg at 03/10/22 0904   Physical exam:  Vitals:   03/09/22 2230 03/10/22 0000 03/10/22 0408 03/10/22 0823  BP: (!) 157/69  116/68 130/73  Pulse: 82  65 (!) 58  Resp: 18  15 16   Temp: 97.8 F (36.6 C)  98.2 F (36.8 C) 98.1 F (36.7 C)  TempSrc: Oral   Oral  SpO2: 100%  99% 100%  Weight:  144 lb (65.3 kg)    Height:  5' 4"  (1.626 m)     Physical Exam Cardiovascular:     Rate and Rhythm: Normal rate and regular rhythm.     Heart sounds: Normal heart sounds.  Pulmonary:     Effort: Pulmonary effort is normal.     Breath sounds: Normal breath sounds.  Abdominal:     General: Bowel sounds are normal.     Palpations: Abdomen is soft.  Skin:    General: Skin is warm and dry.  Neurological:     General: No focal deficit present.     Mental Status: She is alert and oriented to person, place, and time.     Comments: Left-sided neglect           Latest Ref Rng & Units 03/10/2022    6:39 AM  CMP  Glucose 70 - 99 mg/dL 164   BUN 8 - 23 mg/dL 29   Creatinine 0.44 - 1.00 mg/dL 0.93   Sodium 135 - 145 mmol/L 139   Potassium 3.5 - 5.1 mmol/L 3.7   Chloride 98 - 111 mmol/L 105   CO2 22 - 32 mmol/L 25   Calcium 8.9 - 10.3 mg/dL 9.4       Latest Ref Rng & Units 03/10/2022    6:39 AM  CBC  WBC 4.0 - 10.5 K/uL 4.8   Hemoglobin 12.0 - 15.0 g/dL 10.8   Hematocrit 36.0 - 46.0 % 32.0   Platelets 150 - 400 K/uL 236     @IMAGES @  MR BRAIN W WO CONTRAST  Result Date: 03/09/2022 CLINICAL DATA:  Initial evaluation for metastatic disease. EXAM: MRI HEAD WITHOUT AND WITH CONTRAST TECHNIQUE: Multiplanar, multiecho  pulse sequences of the brain and surrounding  structures were obtained without and with intravenous contrast. CONTRAST:  76m GADAVIST GADOBUTROL 1 MMOL/ML IV SOLN COMPARISON:  Prior CT from earlier the same day. FINDINGS: Brain: Age-related cerebral atrophy. Patchy and confluent T2/FLAIR hyperintensity involving the periventricular and deep white matter both cerebral hemispheres as well as the pons, most consistent with chronic small vessel ischemic disease, moderately advanced in nature. Irregular enhancing mass measuring 3.0 x 2.3 x 1.8 cm seen along the gray-white matter differentiation of the right parietal lobe (AP by craniocaudad by transverse). Surrounding T2/FLAIR signal intensity consistent with vasogenic edema without significant regional mass effect. Few scattered foci of internal susceptibility artifact consistent with blood products and/or necrosis. Findings most concerning for a possible metastatic lesion. Primary CNS neoplasm would be the primary differential consideration. No other definite lesions or abnormal enhancement elsewhere within the brain. No other mass effect or midline shift. Additionally, there is mildly increased diffusion signal abnormality involving the cortical gray matter about the right parietal region (series 5, image 31). Overall appearance is suggestive of possible superimposed changes of seizure. Similarly, mild diffusion signal abnormality seen at the right thalamus (series 5, image 23). This could also be related to seizure. A possible small evolving subacute ischemic infarct could also be considered. No significant associated enhancement about these locations. No other evidence for acute or subacute ischemia. Gray-white matter differentiation otherwise maintained. No other areas of chronic cortical infarction. No other acute or chronic intracranial blood products. Ventricles normal size without hydrocephalus. No extra-axial fluid collection. Pituitary gland suprasellar region normal. Vascular: Major intracranial  vascular flow voids are maintained. Asymmetric FLAIR signal intensity involving the left transverse and sigmoid sinus without definite T1 correlate, likely slow/sluggish flow. Skull and upper cervical spine: Craniocervical junction with normal limits. Bone marrow signal intensity mildly heterogeneous without focal marrow replacing lesion. No scalp soft tissue abnormality. Sinuses/Orbits: Globes and orbital soft tissues within normal limits. Scattered mucosal thickening noted about the ethmoidal air cells and maxillary sinuses. Superimposed air-fluid level within the right maxillary sinus, consistent with acute sinusitis. Other: No mastoid effusion. IMPRESSION: 1. 3.0 x 2.3 x 1.8 cm irregular enhancing mass involving the right parietal lobe with localized vasogenic edema. 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 A primary CNS neoplasm would be the primary differential consideration. 2. Mild diffusion signal abnormality involving the cortical gray matter about the right parietal region and right thalamus, suspected to reflect superimposed changes of seizure. Correlation with history and EEG suggested. A possible small evolving subacute ischemic infarct at the right thalamus could be considered in the correct clinical setting, although is felt to be less likely. 3. Underlying age-related cerebral atrophy with moderately advanced chronic microvascular ischemic disease. Electronically Signed   By: BJeannine BogaM.D.   On: 03/09/2022 21:48   CT CHEST ABDOMEN PELVIS W CONTRAST  Result Date: 03/09/2022 CLINICAL DATA:  Brain metastases, unknown primary EXAM: CT CHEST, ABDOMEN, AND PELVIS WITH CONTRAST TECHNIQUE: Multidetector CT imaging of the chest, abdomen and pelvis was performed following the standard protocol during bolus administration of intravenous contrast. RADIATION DOSE REDUCTION: This exam was performed according to the departmental dose-optimization program which includes  automated exposure control, adjustment of the mA and/or kV according to patient size and/or use of iterative reconstruction technique. CONTRAST:  1060mOMNIPAQUE IOHEXOL 300 MG/ML  SOLN COMPARISON:  None Available. FINDINGS: CT CHEST FINDINGS Cardiovascular: Moderate multi-vessel coronary artery calcification. Global cardiac size within normal limits. No pericardial effusion. Central pulmonary  arteries are of normal caliber. Mild atherosclerotic calcification within the thoracic aorta. No aortic aneurysm Mediastinum/Nodes: No enlarged mediastinal, hilar, or axillary lymph nodes. Thyroid gland, trachea, and esophagus demonstrate no significant findings. Lungs/Pleura: Ground-glass opacity with associated traction bronchiolectasis within the right apex likely represents focal fibrotic change. Subpleural fibrotic change within the a anterior right upper lobe and right middle lobe likely represents post radiation change. The lungs are otherwise clear. No pneumothorax or pleural effusion. Central airways are widely patent. Musculoskeletal: No acute bone abnormality. No lytic or blastic bone lesion. Partial right mastectomy and axillary lymph node dissection has been performed. A indeterminate 9 mm nodule is seen within the a inferomedial right breast, not well characterized on this examination. CT ABDOMEN PELVIS FINDINGS Hepatobiliary: No focal liver abnormality is seen. No gallstones, gallbladder wall thickening, or biliary dilatation. Pancreas: Unremarkable Spleen: Unremarkable Adrenals/Urinary Tract: Adrenal glands are unremarkable. Kidneys are normal, without renal calculi, focal lesion, or hydronephrosis. Bladder is unremarkable. Stomach/Bowel: Stomach is within normal limits. Appendix appears normal. No evidence of bowel wall thickening, distention, or inflammatory changes. Vascular/Lymphatic: Aortic atherosclerosis. No enlarged abdominal or pelvic lymph nodes. Reproductive: Status post hysterectomy. No adnexal  masses. Other: No abdominal wall hernia.  Rectum unremarkable. Musculoskeletal: No acute bone abnormality within the abdomen and pelvis. No lytic or blastic bone lesion. IMPRESSION: 1. No evidence of primary or metastatic disease within the chest, abdomen, and pelvis. 2. Moderate multi-vessel coronary artery calcification. 3. 9 mm indeterminate nodule within the inferomedial right breast, not well characterized on this examination. Correlation with dedicated breast imaging is recommended. Electronically Signed   By: Fidela Salisbury M.D.   On: 03/09/2022 19:03   CT HEAD CODE STROKE WO CONTRAST`  Result Date: 03/09/2022 CLINICAL DATA:  Code stroke. Mental status change, unknown cause. Left-sided weakness. Headache. EXAM: CT HEAD WITHOUT CONTRAST TECHNIQUE: Contiguous axial images were obtained from the base of the skull through the vertex without intravenous contrast. RADIATION DOSE REDUCTION: This exam was performed according to the departmental dose-optimization program which includes automated exposure control, adjustment of the mA and/or kV according to patient size and/or use of iterative reconstruction technique. COMPARISON:  No pertinent prior exams available for comparison. FINDINGS: Brain: Mild generalized parenchymal atrophy. 2.3 x 1.6 cm hyperdense mass within the right parietal lobe with mild surrounding vasogenic edema (for instance as seen on series 5, image 43). Background moderate patchy and ill-defined hypoattenuation within the cerebral white matter, nonspecific but compatible with chronic small vessel ischemic disease. There is no acute intracranial hemorrhage. No demarcated cortical infarct. No extra-axial fluid collection. No midline shift. Vascular: No hyperdense vessel. Atherosclerotic calcifications. Skull: No fracture or aggressive osseous lesion. Sinuses/Orbits: No mass or acute finding within the imaged orbits. Fluid within the inferior left frontal sinus. Minimal mucosal thickening  within the bilateral ethmoid and sphenoid sinuses at the imaged levels. Other: Bilateral TMJ osteoarthrosis. ASPECTS ([place] Stroke Program Early CT Score) - Ganglionic level infarction (caudate, lentiform nuclei, internal capsule, insula, M1-M3 cortex): 7 - Supraganglionic infarction (M4-M6 cortex): 3 Total score (0-10 with 10 being normal): 10 These results were called by telephone at the time of interpretation on 03/09/2022 at 5:10 pm to provider [facility] , who verbally acknowledged these results. IMPRESSION: 2.3 x 1.6 cm hyperdense mass within the right parietal lobe with mild surrounding vasogenic edema. This is most suspicious for an intracranial metastasis. A brain MRI without and with contrast is recommended for further evaluation. No evidence of acute infarct or acute intracranial hemorrhage. Moderate  chronic small vessel ischemic changes within the cerebral white matter. Mild generalized parenchymal atrophy. Paranasal sinus disease, as described. Electronically Signed   By: Kellie Simmering D.O.   On: 03/09/2022 17:15    Assessment and plan- Patient is a 76 y.o. female with remote history of right breast cancer in 2014 admitted for acute encephalopathy secondary to solitary brain mass  I briefly discussed her case with Dr. Lindi Adie who is her primary oncologist.  CT chest abdomen and pelvis with contrast did not show any evidence of metastatic disease or primary lesion.  There is an indeterminate 9 mm right breast lesion which can be followed by dedicated mammogram as an outpatient.  MRI brain shows a solitary 3 cm mass in the right parietal lobe with surrounding edema.  Since this is the only site of disease I would recommend proceeding with primary resection at this time which will also give [place] tissue diagnosis.  At this time it cannot be is certain that this is breast cancer and could be his primary CNS neoplasm as well.  I did discuss her case with Dr. Cari Caraway who was concerned that there was  potential rupture of this lesion into the subarachnoid space which means that there is a potential for leptomeningeal disease in the future.  Continue steroids and seizure precautions as per neurology and neurosurgery.  No acute oncology input warranted at this time.  I will get a bone scan over the next few days postsurgery.  There is a potential for surgery today and therefore I am avoiding to get the bone scan today..  Stereotactic radiation could be considered to this lesion postsurgery and that can be arranged as an outpatient.  I will also have palliative care see her at this time.  Patient will follow-up with Dr. Lindi Adie upon discharge    Visit Diagnosis 1. Brain mass     Dr. Randa Evens, MD, MPH [facility] at [facility] [phone] 03/10/2022

## 2022-03-10 NOTE — Progress Notes (Signed)
Inpatient Rehab Admissions Coordinator Note:   Per PT/OT recommendations patient was screened for CIR candidacy by Michel Santee, PT. At this time, pt appears to be a potential candidate for CIR. I will place an order for rehab consult for full assessment, per our protocol.  Please contact me any with questions.Shann Medal, PT, DPT [phone] 03/10/22 4:21 PM

## 2022-03-10 NOTE — Evaluation (Addendum)
Clinical/Bedside Swallow Evaluation Patient Details  Name: Tabitha Ramirez MRN: [id number] Date of Birth: 05-19-1946  Today's Date: 03/10/2022 Time: SLP Start Time (ACUTE ONLY): 4034 SLP Stop Time (ACUTE ONLY): 7425 SLP Time Calculation (min) (ACUTE ONLY): 60 min  Past Medical History:  Past Medical History:  Diagnosis Date   Allergy    Anemia    Arthritis    "joints" (09/29/2013)   Breast cancer ([place])    GERD (gastroesophageal reflux disease)    Heart murmur    History of blood transfusion    "related to chemo/breast cancer" (09/29/2013)   Hypertension    Migraines    Personal history of chemotherapy    Personal history of radiation therapy    Radiation 11/05/13-12/24/13   Right breast    TMJ (temporomandibular joint syndrome)    "left; just dx'd" (09/29/2013   Past Surgical History:  Past Surgical History:  Procedure Laterality Date   AXILLARY LYMPH NODE BIOPSY Right 03/10/2013   Procedure: AXILLARY LYMPH NODE BIOPSY;  Surgeon: Adin Hector, MD;  Location: [facility];  Service: General;  Laterality: Right;  sentinel node with blue dye   BREAST BIOPSY     BREAST LUMPECTOMY Right 09/29/2013   needle localization w/axillary LND/notes 09/29/2013   BREAST LUMPECTOMY WITH NEEDLE LOCALIZATION AND AXILLARY LYMPH NODE DISSECTION Right 09/29/2013   Procedure: RIGHT BREAST NEEDLE LOCALIZED LUMPECTOMY AND AXILLARY LYMPH NODE DISSECTION;  Surgeon: Adin Hector, MD;  Location: [facility];  Service: General;  Laterality: Right;   COLONOSCOPY N/A 04/30/2013   Procedure: COLONOSCOPY;  Surgeon: Wonda Horner, MD;  Location: [facility];  Service: Endoscopy;  Laterality: N/A;   ESOPHAGOGASTRODUODENOSCOPY N/A 04/28/2013   Procedure: ESOPHAGOGASTRODUODENOSCOPY (EGD);  Surgeon: Wonda Horner, MD;  Location: [facility];  Service: Endoscopy;  Laterality: N/A;   MASTECTOMY Right 09/29/2013   "partial"   PORT-A-CATH REMOVAL  09/29/2013   PORT-A-CATH REMOVAL Left 09/29/2013   Procedure: REMOVAL PORT-A-CATH;  Surgeon:  Adin Hector, MD;  Location: [facility];  Service: General;  Laterality: Left;   PORTACATH PLACEMENT N/A 03/10/2013   Procedure: INSERTION PORT-A-CATH WITH FLUOROSCOPY AND ULTRASOUND;  Surgeon: Adin Hector, MD;  Location: [facility];  Service: General;  Laterality: N/A;   SURAL NERVE BX Right 05/13/2014   Procedure: SURAL NERVE BIOPSY;  Surgeon: Hosie Spangle, MD;  Location: [facility];  Service: Neurosurgery;  Laterality: Right;  Right sural nerve biopsy   TONSILLECTOMY     TOTAL ABDOMINAL HYSTERECTOMY     With bilateral salpingo-oophorectomy   HPI:  Pt is a 76 y.o. female with multiple medical problems including history of triple negative breast cancer in remission for 7 years status post chemotherapy/XRT, hypertension, arthritis(r hand), and hypothyroidism, who was admitted to hospital 03/09/2022 with altered mental status and left-sided weakness/facial drooping.   MRI: 3.0 x 2.3 x 1.8 cm irregular enhancing mass involving the right  parietal lobe with localized vasogenic edema. 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 A primary CNS  neoplasm would be the primary differential consideration.  2. Mild diffusion signal abnormality involving the cortical gray  matter about the right parietal region and right thalamus, suspected  to reflect superimposed changes of seizure. Correlation with history  and EEG suggested. A possible small evolving subacute ischemic  infarct at the right thalamus could be considered in the correct  clinical setting, although is felt to be less likely.  3. Underlying age-related cerebral atrophy with moderately Advanced  chronic microvascular ischemic disease.  CT of Chest: associated traction  bronchiolectasis within the right apex likely represents focal  fibrotic change. Subpleural fibrotic change within the a anterior  right upper lobe and right middle lobe likely represents post  radiation change. The lungs are otherwise clear.  Oncology Consulted.     Assessment / Plan / Recommendation  Clinical Impression   Pt seen for BSE today. She was sitting in chair. Needed min support for upright sitting as she tended to lean to her Right side. Pt has Left visual field neglect but able to id w/ cues; also noted R hand weakness/arthritis during self feeding. Pt awake, verbal and engaged in basic conversation re: herself and the environment; mild decreased awareness and orientation to events noted.  Pt appears to present w/ grossly adequate oropharyngeal phase swallow function w/ No gross oropharyngeal phase dysphagia noted when following general aspiration precautions in setting of brain mass; weakness. Pt consumed po trials following aspiration precautions w/ No overt, clinical s/s of aspiration during the po trials. Pt appears at reduced risk for aspiration following general aspiration precautions, when given support during the meal(including w/ feeding), and when using a slightly modified diet for ease of mastication/oral intake. Pt may benefit from a built-up handle to better hold/manage a utensil during self-feeding - grip device provided by this Clinician.   During po trials, pt consumed trials of thin liquids VIA CUP, purees and soft solids w/ no overt coughing, decline in vocal quality, or change in respiratory presentation during/post trials. Of Note: immediate coughing was noted when pt took multiple sips via straw, x1. Straw were not utilized during the remainder of the eval, nor recommended. Oral phase appeared grossly [facility] w/ adequate bolus management, mastication, and control of bolus propulsion for A-P transfer for swallowing. Oral clearing achieved w/ all trial consistencies. No anterior bolus loss noted. Encouraged pt to take Rest Breaks for conservation of energy.  OM Exam appeared to reveal Slight unilateral, L labial weakness; No lingual deviation nor weakness. Speech Clear/intelligible though volume was low. Pt fed self w/ setup and support.    Recommend a more Mech Soft consistency diet w/ well-Cut meats, moistened foods; Thin liquids VIA CUP - pt does not often use straws at home baseline(and d/t coughing noted when using at this eval). Recommend general aspiration precautions - reduce distractions at meals and less talking. Pills WHOLE vs need to Crush in Puree for safer, easier swallowing. Recommend support and Supervision at meals to assist w/ setup, feeding as needed d/t visual field neglect - monitor tray setup and foods on tray to support awareness of po's. Rest Breaks for conservation of energy. Education given on Pills in Puree; food consistencies and easy to eat options; general aspiration precautions.   No further Acute swallow needs indicated at this time. Signage posted in room to alert to LEFT visual field Neglect and to support during tasks, especially positioning of tray during meals. In setting of pt's current illness and tx POC, NSG to reconsult if any new needs arise while admitted. Pt may need f/u for assessment of cognitive-linguistic skills if any change from baseline status post tx of mass involving the right parietal lobe with localized vasogenic edema. The above was discussed w/ Daughter who arrived post Lunch meal; swallowing and aspiration precautions discussed. Precautions posted in room. Daughter, pt and NSG agreed. MD updated.  SLP Visit Diagnosis: Dysphagia, unspecified (R13.10)    Aspiration Risk  Mild aspiration risk;Risk for inadequate nutrition/hydration (in setting of neuro changes but  reduced following general aspiration precautions)    Diet Recommendation   Mech Soft consistency diet w/ well-Cut meats, moistened foods; Thin liquids VIA CUP - pt does not often use straws at home baseline(and, d/t coughing noted when using at this eval). Recommend general aspiration precautions - reduce distractions at meals and less talking. Recommend support and Supervision at meals to assist w/ setup, feeding as needed  d/t visual field neglect - monitor tray setup and foods on tray to support awareness of po's. Rest Breaks for conservation of energy.  Medication Administration: Whole meds with puree (vs Crushed w/ puree)    Other  Recommendations Recommended Consults:  (Dietician f/u) Oral Care Recommendations: Oral care BID;Oral care before and after PO;Staff/trained caregiver to provide oral care (support pt) Other Recommendations:  (n/a)    Recommendations for follow up therapy are one component of a multi-disciplinary discharge planning process, led by the attending physician.  Recommendations may be updated based on patient status, additional functional criteria and insurance authorization.  Follow up Recommendations Follow physician's recommendations for discharge plan and follow up therapies      Assistance Recommended at Discharge Intermittent Supervision/Assistance  Functional Status Assessment Patient has had a recent decline in their functional status and demonstrates the ability to make significant improvements in function in a reasonable and predictable amount of time. (no real decline in swallowing)  Frequency and Duration  (n/a at this time)   (n/a)       Prognosis Prognosis for Safe Diet Advancement: Good Barriers to Reach Goals: Time post onset;Severity of deficits Barriers/Prognosis Comment: brain mass; Left visual field neglect; R hand weakness/arthritis      Swallow Study   General Date of Onset: 03/09/22 HPI: Pt is a 76 y.o. female with multiple medical problems including history of triple negative breast cancer in remission for 7 years status post chemotherapy/XRT, hypertension, arthritis(r hand), and hypothyroidism, who was admitted to hospital 03/09/2022 with altered mental status and left-sided weakness/facial drooping.  MRI: 3.0 x 2.3 x 1.8 cm irregular enhancing mass involving the right  parietal lobe with localized vasogenic edema. Findings most  concerning for a solitary  intracranial metastasis. A primary CNS  neoplasm would be the primary differential consideration.  2. Mild diffusion signal abnormality involving the cortical gray  matter about the right parietal region and right thalamus, suspected  to reflect superimposed changes of seizure. Correlation with history  and EEG suggested. A possible small evolving subacute ischemic  infarct at the right thalamus could be considered in the correct  clinical setting, although is felt to be less likely.  3. Underlying age-related cerebral atrophy with moderately Advanced  chronic microvascular ischemic disease.   CT of Chest: associated traction  bronchiolectasis within the right apex likely represents focal  fibrotic change. Subpleural fibrotic change within the a anterior  right upper lobe and right middle lobe likely represents post  radiation change. The lungs are otherwise clear.  Oncology Consulted. Type of Study: Bedside Swallow Evaluation Previous Swallow Assessment: none Diet Prior to this Study: Regular;Thin liquids Temperature Spikes Noted: No (wbc 4.8) Respiratory Status: Room air History of Recent Intubation: No Behavior/Cognition: Alert;Cooperative;Pleasant mood;Distractible;Requires cueing (soft voice) Oral Cavity Assessment: Within Functional Limits Oral Care Completed by SLP: Yes Oral Cavity - Dentition: Adequate natural dentition Vision: Impaired for self-feeding (Left visual field neglect but able to id w/ cues) Self-Feeding Abilities: Able to feed self;Needs assist;Needs set up (arthritis in R hand baseline) Patient Positioning: Upright in bed (needed positioning support) Baseline  Vocal Quality: Normal;Low vocal intensity (baseline) Volitional Cough: Strong Volitional Swallow: Able to elicit    Oral/Motor/Sensory Function Overall Oral Motor/Sensory Function: Mild impairment (-Slight) Facial Symmetry: Abnormal symmetry left Lingual ROM: Within Functional Limits Lingual Symmetry: Within Functional  Limits Lingual Strength: Within Functional Limits Velum: Within Functional Limits Mandible: Within Functional Limits   Ice Chips Ice chips: Within functional limits Presentation: Spoon (3 trials)   Thin Liquid Thin Liquid: Impaired (w/ multiple sips via straw, x1) Presentation: Cup;Self Fed (10 trials) Oral Phase Impairments:  (n/a) Oral Phase Functional Implications:  (n/a) Pharyngeal  Phase Impairments:  (none) Other Comments: Cup drinking recommended    Nectar Thick Nectar Thick Liquid: Not tested   Honey Thick Honey Thick Liquid: Not tested   Puree Puree: Within functional limits Presentation: Self Fed;Spoon (supported; 4 ozs)   Solid     Solid: Within functional limits (grossly - no overt deficits noted) Presentation: Self Fed;Spoon (~7-8 trials)         Orinda Kenner, MS, CCC-SLP Speech Language Pathologist Rehab Services; [place] [phone] (ascom) Timothy Trudell 03/10/2022,3:22 PM

## 2022-03-11 ENCOUNTER — Encounter: Payer: Self-pay | Admitting: Internal Medicine

## 2022-03-11 ENCOUNTER — Inpatient Hospital Stay: Payer: Medicare PPO | Admitting: Anesthesiology

## 2022-03-11 ENCOUNTER — Inpatient Hospital Stay: Payer: Medicare PPO

## 2022-03-11 ENCOUNTER — Encounter
Admission: EM | Disposition: A | Discharge status: Rehabilitation Facility | Payer: Self-pay | Source: Home / Self Care | Attending: Internal Medicine

## 2022-03-11 ENCOUNTER — Other Ambulatory Visit: Payer: Self-pay

## 2022-03-11 DIAGNOSIS — G9389 Other specified disorders of brain: Secondary | ICD-10-CM | POA: Diagnosis not present

## 2022-03-11 HISTORY — PX: CRANIOTOMY: SHX93

## 2022-03-11 LAB — GLUCOSE, CAPILLARY: Glucose-Capillary: 164 mg/dL — ABNORMAL HIGH (ref 70–99)

## 2022-03-11 LAB — MRSA NEXT GEN BY PCR, NASAL: MRSA by PCR Next Gen: NOT DETECTED

## 2022-03-11 SURGERY — CRANIOTOMY TUMOR EXCISION
Anesthesia: General | Laterality: Right

## 2022-03-11 MED ORDER — ACETAMINOPHEN 10 MG/ML IV SOLN
INTRAVENOUS | Status: DC | PRN
  Administered 2022-03-11: 1000 mg via INTRAVENOUS

## 2022-03-11 MED ORDER — ONDANSETRON HCL 4 MG/2ML IJ SOLN
INTRAMUSCULAR | Status: DC | PRN
  Administered 2022-03-11: 4 mg via INTRAVENOUS

## 2022-03-11 MED ORDER — CEFAZOLIN SODIUM-DEXTROSE 2-3 GM-%(50ML) IV SOLR
INTRAVENOUS | Status: DC | PRN
  Administered 2022-03-11: 2 g via INTRAVENOUS

## 2022-03-11 MED ORDER — MANNITOL 25 % IV SOLN
INTRAVENOUS | Status: AC
  Filled 2022-03-11: qty 50

## 2022-03-11 MED ORDER — SURGIFLO WITH THROMBIN (HEMOSTATIC MATRIX KIT) OPTIME
TOPICAL | Status: DC | PRN
  Administered 2022-03-11: 1 via TOPICAL

## 2022-03-11 MED ORDER — MANNITOL 20 % IV SOLN: INTRAVENOUS | Status: DC | PRN

## 2022-03-11 MED ORDER — GELATIN ABSORBABLE 100 CM EX MISC
CUTANEOUS | Status: DC | PRN
  Administered 2022-03-11: 1 via TOPICAL

## 2022-03-11 MED ORDER — SODIUM CHLORIDE 0.9 % IV SOLN: INTRAVENOUS | Status: DC

## 2022-03-11 MED ORDER — FENTANYL CITRATE (PF) 100 MCG/2ML IJ SOLN
INTRAMUSCULAR | Status: AC
  Filled 2022-03-11: qty 2

## 2022-03-11 MED ORDER — 0.9 % SODIUM CHLORIDE (POUR BTL) OPTIME
TOPICAL | Status: DC | PRN
  Administered 2022-03-11: 1000 mL

## 2022-03-11 MED ORDER — SUGAMMADEX SODIUM 200 MG/2ML IV SOLN
INTRAVENOUS | Status: DC | PRN
  Administered 2022-03-11: 200 mg via INTRAVENOUS

## 2022-03-11 MED ORDER — DEXAMETHASONE SODIUM PHOSPHATE 10 MG/ML IJ SOLN
INTRAMUSCULAR | Status: AC
  Filled 2022-03-11: qty 1

## 2022-03-11 MED ORDER — PHENYLEPHRINE HCL-NACL 20-0.9 MG/250ML-% IV SOLN
INTRAVENOUS | Status: DC | PRN
  Administered 2022-03-11: 30 ug/min via INTRAVENOUS

## 2022-03-11 MED ORDER — ROCURONIUM BROMIDE 100 MG/10ML IV SOLN
INTRAVENOUS | Status: DC | PRN
  Administered 2022-03-11: 50 mg via INTRAVENOUS
  Administered 2022-03-11: 30 mg via INTRAVENOUS
  Administered 2022-03-11: 20 mg via INTRAVENOUS

## 2022-03-11 MED ORDER — LIDOCAINE-EPINEPHRINE 1 %-1:100000 IJ SOLN
INTRAMUSCULAR | Status: DC | PRN
  Administered 2022-03-11: 6 mL

## 2022-03-11 MED ORDER — PROPOFOL 10 MG/ML IV BOLUS
INTRAVENOUS | Status: AC
  Filled 2022-03-11: qty 40

## 2022-03-11 MED ORDER — ACETAMINOPHEN 10 MG/ML IV SOLN
INTRAVENOUS | Status: AC
  Filled 2022-03-11: qty 100

## 2022-03-11 MED ORDER — PHENYLEPHRINE 80 MCG/ML (10ML) SYRINGE FOR IV PUSH (FOR BLOOD PRESSURE SUPPORT)
PREFILLED_SYRINGE | INTRAVENOUS | Status: AC
  Filled 2022-03-11: qty 10

## 2022-03-11 MED ORDER — PHENYLEPHRINE 80 MCG/ML (10ML) SYRINGE FOR IV PUSH (FOR BLOOD PRESSURE SUPPORT)
PREFILLED_SYRINGE | INTRAVENOUS | Status: DC | PRN
  Administered 2022-03-11: 160 ug via INTRAVENOUS
  Administered 2022-03-11: 80 ug via INTRAVENOUS
  Administered 2022-03-11 (×3): 160 ug via INTRAVENOUS

## 2022-03-11 MED ORDER — LACTATED RINGERS IV SOLN: INTRAVENOUS | Status: DC | PRN

## 2022-03-11 MED ORDER — DEXAMETHASONE SODIUM PHOSPHATE 10 MG/ML IJ SOLN
INTRAMUSCULAR | Status: DC | PRN
  Administered 2022-03-11: 10 mg via INTRAVENOUS

## 2022-03-11 MED ORDER — MANNITOL 20 % IV SOLN
INTRAVENOUS | Status: AC
  Filled 2022-03-11: qty 500

## 2022-03-11 MED ORDER — PROPOFOL 10 MG/ML IV BOLUS
INTRAVENOUS | Status: DC | PRN
  Administered 2022-03-11 (×2): 100 mg via INTRAVENOUS

## 2022-03-11 MED ORDER — EPHEDRINE 5 MG/ML INJ
INTRAVENOUS | Status: AC
  Filled 2022-03-11: qty 5

## 2022-03-11 MED ORDER — FENTANYL CITRATE (PF) 100 MCG/2ML IJ SOLN: 25.0000 ug | INTRAMUSCULAR | Status: DC | PRN

## 2022-03-11 MED ORDER — LIDOCAINE HCL (CARDIAC) PF 100 MG/5ML IV SOSY
PREFILLED_SYRINGE | INTRAVENOUS | Status: DC | PRN
  Administered 2022-03-11: 80 mg via INTRAVENOUS

## 2022-03-11 MED ORDER — EPHEDRINE SULFATE (PRESSORS) 50 MG/ML IJ SOLN
INTRAMUSCULAR | Status: DC | PRN
  Administered 2022-03-11 (×2): 5 mg via INTRAVENOUS

## 2022-03-11 MED ORDER — LIDOCAINE HCL (PF) 2 % IJ SOLN
INTRAMUSCULAR | Status: AC
  Filled 2022-03-11: qty 5

## 2022-03-11 MED ORDER — CHLORHEXIDINE GLUCONATE CLOTH 2 % EX PADS
6.0000 | MEDICATED_PAD | Freq: Every day | CUTANEOUS | Status: DC
  Administered 2022-03-11 – 2022-03-15 (×5): 6 via TOPICAL

## 2022-03-11 MED ORDER — BACITRACIN 500 UNIT/GM EX OINT
TOPICAL_OINTMENT | CUTANEOUS | Status: DC | PRN
  Administered 2022-03-11: 1 via TOPICAL

## 2022-03-11 MED ORDER — FENTANYL CITRATE (PF) 100 MCG/2ML IJ SOLN
INTRAMUSCULAR | Status: DC | PRN
  Administered 2022-03-11 (×2): 50 ug via INTRAVENOUS

## 2022-03-11 MED ORDER — ONDANSETRON HCL 4 MG/2ML IJ SOLN
INTRAMUSCULAR | Status: AC
  Filled 2022-03-11: qty 2

## 2022-03-11 MED ORDER — ROCURONIUM BROMIDE 10 MG/ML (PF) SYRINGE
PREFILLED_SYRINGE | INTRAVENOUS | Status: AC
  Filled 2022-03-11: qty 10

## 2022-03-11 MED ORDER — ONDANSETRON HCL 4 MG/2ML IJ SOLN: 4.0000 mg | Freq: Once | INTRAMUSCULAR | Status: DC | PRN

## 2022-03-11 SURGICAL SUPPLY — 68 items
ADH LQ OCL WTPRF AMP STRL LF (MISCELLANEOUS)
ADHESIVE MASTISOL STRL (MISCELLANEOUS) ×1 IMPLANT
AGENT HMST KT MTR STRL THRMB (HEMOSTASIS) ×1
APL PRP STRL LF ISPRP CHG 10.5 (MISCELLANEOUS) ×2
APPLICATOR CHLORAPREP 10.5 ORG (MISCELLANEOUS) ×4 IMPLANT
BASIN KIT SINGLE STR (MISCELLANEOUS) ×2 IMPLANT
BLADE CLIPPER SPEC (BLADE) ×2 IMPLANT
BNDG GAUZE DERMACEA FLUFF (GAUZE/BANDAGES/DRESSINGS)
BNDG GAUZE DERMACEA FLUFF 4 (GAUZE/BANDAGES/DRESSINGS) ×3 IMPLANT
BNDG GZE DERMACEA 4 6PLY (GAUZE/BANDAGES/DRESSINGS)
BRUSH SCRUB EZ  4% CHG (MISCELLANEOUS) ×1
BRUSH SCRUB EZ 4% CHG (MISCELLANEOUS) ×1 IMPLANT
BUR ACORN 7.5 PRECISION (BURR) ×1 IMPLANT
BUR TAPERED ROUTER 2.3 (BUR) IMPLANT
BURR TAPERED ROUTER 2.3 (BUR) ×2
CLIP TI MEDIUM 6 (CLIP) ×2 IMPLANT
COUNTER NEEDLE 20/40 LG (NEEDLE) ×4 IMPLANT
DRAPE POUCH INSTRU U-SHP 10X18 (DRAPES) ×2 IMPLANT
DRAPE SURG 17X11 SM STRL (DRAPES) ×8 IMPLANT
DRSG TEGADERM 4X4.75 (GAUZE/BANDAGES/DRESSINGS) ×1 IMPLANT
DRSG TELFA 4X3 1S NADH ST (GAUZE/BANDAGES/DRESSINGS) ×3 IMPLANT
ELECT CAUTERY BLADE TIP 2.5 (TIP) ×2
ELECT REM PT RETURN 9FT ADLT (ELECTROSURGICAL) ×2
ELECTRODE CAUTERY BLDE TIP 2.5 (TIP) ×1 IMPLANT
ELECTRODE REM PT RTRN 9FT ADLT (ELECTROSURGICAL) ×1 IMPLANT
FORCEPS BIPOLAR SPETZLER 8 1.0 (NEUROSURGERY SUPPLIES) ×1 IMPLANT
GAUZE 4X4 16PLY ~~LOC~~+RFID DBL (SPONGE) ×6 IMPLANT
GAUZE XEROFORM 1X8 LF (GAUZE/BANDAGES/DRESSINGS) ×1 IMPLANT
GLOVE BIOGEL PI IND STRL 6.5 (GLOVE) ×1 IMPLANT
GLOVE BIOGEL PI INDICATOR 6.5 (GLOVE) ×1
GLOVE SURG SYN 6.5 ES PF (GLOVE) ×2 IMPLANT
GLOVE SURG SYN 6.5 PF PI (GLOVE) ×1 IMPLANT
GLOVE SURG SYN 8.5  E (GLOVE) ×3
GLOVE SURG SYN 8.5 E (GLOVE) ×3 IMPLANT
GLOVE SURG SYN 8.5 PF PI (GLOVE) ×3 IMPLANT
GOWN SRG LRG LVL 4 IMPRV REINF (GOWNS) ×1 IMPLANT
GOWN SRG XL LVL 3 NONREINFORCE (GOWNS) ×1 IMPLANT
GOWN STRL NON-REIN TWL XL LVL3 (GOWNS) ×2
GOWN STRL REIN LRG LVL4 (GOWNS) ×2
GRADUATE 1200CC STRL 31836 (MISCELLANEOUS) ×2 IMPLANT
HEMOSTAT SURGICEL 2X14 (HEMOSTASIS) ×3 IMPLANT
IV CATH ANGIO 14GX1.88 NO SAFE (IV SOLUTION) ×4 IMPLANT
KIT TURNOVER KIT A (KITS) ×2 IMPLANT
MANIFOLD NEPTUNE II (INSTRUMENTS) ×2 IMPLANT
MARKER SKIN DUAL TIP RULER LAB (MISCELLANEOUS) ×4 IMPLANT
NS IRRIG 1000ML POUR BTL (IV SOLUTION) ×4 IMPLANT
PACK CRANIOTOMY CUSTOM (CUSTOM PROCEDURE TRAY) ×2 IMPLANT
PAD ARMBOARD 7.5X6 YLW CONV (MISCELLANEOUS) ×7 IMPLANT
PATTIES SURGICAL .5 X.5 (GAUZE/BANDAGES/DRESSINGS) ×4 IMPLANT
PATTIES SURGICAL .5X1.5 (GAUZE/BANDAGES/DRESSINGS) ×4 IMPLANT
PIN MAYFIELD SKULL DISP (PIN) ×2 IMPLANT
PLATE 1.5/0.5 13MM BURR HOLE (Plate) ×2 IMPLANT
SCREW SELF DRILL HT 1.5/4MM (Screw) ×6 IMPLANT
SHEET NEURO XL SOL CTL (MISCELLANEOUS) ×2 IMPLANT
SOLUTION IRRIG SURGIPHOR (IV SOLUTION) ×2 IMPLANT
STAPLER SKIN PROX 35W (STAPLE) ×4 IMPLANT
SURGIFLO W/THROMBIN 8M KIT (HEMOSTASIS) ×4 IMPLANT
SUT BIOSYN 3-0 30 V-20 (SUTURE) ×2 IMPLANT
SUT MNCRL AB 3-0 PS2 27 (SUTURE) ×1 IMPLANT
SUT NURALON 4 0 TR CR/8 (SUTURE) ×4 IMPLANT
SUT POLYSORB 2-0 5X18 GS-10 (SUTURE) ×6 IMPLANT
SUT VIC AB 2-0 CT2 18 VCP726D (SUTURE) ×6 IMPLANT
TAPE CLOTH 3X10 WHT NS LF (GAUZE/BANDAGES/DRESSINGS) ×4 IMPLANT
TOWEL OR 17X26 4PK STRL BLUE (TOWEL DISPOSABLE) ×6 IMPLANT
TRAY FOLEY MTR SLVR 16FR STAT (SET/KITS/TRAYS/PACK) ×2 IMPLANT
TUBING CONNECTING 10 (TUBING) ×2 IMPLANT
WATER STERILE IRR 1000ML POUR (IV SOLUTION) ×2 IMPLANT
WATER STERILE IRR 500ML POUR (IV SOLUTION) ×2 IMPLANT

## 2022-03-11 NOTE — Interval H&P Note (Signed)
History and Physical Interval Note:  03/11/2022 7:01 AM  Tabitha Ramirez  has presented today for surgery, with the diagnosis of brain mass.  The various methods of treatment have been discussed with the patient and family. After consideration of risks, benefits and other options for treatment, the patient has consented to  Procedure(s): CRANIOTOMY TUMOR EXCISION (Right) as a surgical intervention.  The patient's history has been reviewed, patient examined, no change in status, stable for surgery.  I have reviewed the patient's chart and labs.  Questions were answered to the patient's satisfaction.     Carron Jaggi

## 2022-03-11 NOTE — Progress Notes (Signed)
PT Cancellation Note  Patient Details Name: XIA STOHR MRN: [id number] DOB: 1946-05-16   Cancelled Treatment:    Reason Eval/Treat Not Completed: Other (comment) Pt noted to be off the floor & undergoing craniotomy tumor excision. Per chart, pt to transfer to ICU from PACU. Per protocol, will require new PT orders for re-evaluation when/if pt is medically appropriate after procedure. Will complete current orders & await new ones.  Lavone Nian, PT, DPT 03/11/22, 9:07 AM   Waunita Schooner 03/11/2022, 9:06 AM

## 2022-03-11 NOTE — Progress Notes (Signed)
PROGRESS NOTE    Tabitha Ramirez  PRF:[id number] DOB: 31-Mar-1946 DOA: 03/09/2022 PCP: [facility]    Brief Narrative:   76 y.o. 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  Patient unable to provide history so majority of history gained from patient's son-in-law at bedside.   Patient last seen well at 2:45 PM on the day of admission.  Patient was dressing to pick up her grandchildren when she noted the hand shaking and could not get dressed.  Patient was found to be confused and presented to the emergency room.  Patient did present with left facial droop, left arm and leg drift, decree sensation with ataxia.   CT head showed right frontal parietal mass lesion likely metastases.  Neurology was called by EDP.  Code stroke was initially called.  Was canceled after CT findings were known.  Neurosurgery was contacted by EDP who recommended Decadron for vasogenic edema associated with presumptive cerebral metastasis.   Given the high likelihood of seizure as a result of the mass patient was loaded with Keppra 2000 mg in ED.  No further seizure activity noted  6/16: No clinical status changes overnight.  Slurred speech improving.  Patient remains somewhat confused.  Case discussed with neurosurgery Dr. Cari Caraway who discussed with primary oncologist.  Plan for craniotomy for tumor resection and tissue diagnosis.  Patient being consented for procedure today.  6/17: Patient seen and examined in ICU.  Discussed with neurosurgery.  Patient underwent right parietal craniectomy and resection of brain tumor this morning.  Tolerated procedure well    Assessment & Plan:   Principal Problem:   Brain mass Active Problems:   Essential hypertension, benign   Anemia   Seizure (HCC)    Palliative care encounter  New brain mass with vasogenic edema Confusion Weakness This likely represents metastasis MRI brain with solitary mass consistent with CT CT chest abdomen pelvis with no evidence of metastatic disease Oncology, neurology, neurosurgery engaged Status post craniotomy and tumor resection 6/17 Plan: IV steroids Frequent neurochecks ICU status for now   Likely new onset seizure Likely secondary to brain mass Seen by neurology in ED Status post Keppra 2000 mg load Plan: Continue Keppra 500 mg IV twice daily Decadron as above Seizure precautions Neurology follow-up  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  Neurosurgery discussed with patient's daughter Disposition Plan: Status is: Inpatient Remains inpatient appropriate because: New brain mass, suspected metastasis.  Status post inpatient craniotomy  Level of care: ICU  Consultants:  Neurosurgery Neurology Oncology Palliative care  Procedures:  Craniotomy 6/17  Antimicrobials: None   Subjective: Seen and examined.  Sleepy this morning.  Just out of OR  Objective: Vitals:   03/11/22 1145 03/11/22 1200 03/11/22 1215 03/11/22 1239  BP: 120/64 123/62 116/64 (!) 116/51  Pulse: 62 65 63 66  Resp: 15 (!) 33 16 17  Temp: (!) 96.4 F (35.8 C)  (!) 97 F (36.1 C)   TempSrc:      SpO2: 100% 99% 100% 100%  Weight:  Height:        Intake/Output Summary (Last 24 hours) at 03/11/2022 1254 Last data filed at 03/11/2022 1247 Gross per 24 hour  Intake 1582.5 ml  Output 850 ml  Net 732.5 ml   Filed Weights    03/10/22 0000  Weight: 65.3 kg    Examination:  General exam: NAD.  Sleepy Respiratory system: Clear to auscultation. Respiratory effort normal. Cardiovascular system: S1-S2, RRR, no murmurs, no pedal edema Gastrointestinal system: Thin, soft, NT/ND, normal bowel sounds Central nervous system: Alert and oriented.  Left-sided weakness Extremities: Symmetric 5 x 5 power.  Decreased power on left Skin: No rashes, lesions or ulcers Psychiatry: Unable to assess    Data Reviewed: I have personally reviewed following labs and imaging studies  CBC: Recent Labs  Lab 03/09/22 1537 03/10/22 0639  WBC 8.2 4.8  HGB 12.0 10.8*  HCT 36.4 32.0*  MCV 101.7* 98.2  PLT 238 938   Basic Metabolic Panel: Recent Labs  Lab 03/09/22 1537 03/10/22 0639  NA 141 139  K 4.1 3.7  CL 108 105  CO2 22 25  GLUCOSE 70 164*  BUN 28* 29*  CREATININE 0.95 0.93  CALCIUM 9.5 9.4   GFR: Estimated Creatinine Clearance: 45.8 mL/min (by C-G formula based on SCr of 0.93 mg/dL). Liver Function Tests: Recent Labs  Lab 03/09/22 1537  AST 33  ALT 27  ALKPHOS 84  BILITOT 1.2  PROT 8.3*  ALBUMIN 4.1   No results for input(s): "LIPASE", "AMYLASE" in the last 168 hours. No results for input(s): "AMMONIA" in the last 168 hours. Coagulation Profile: Recent Labs  Lab 03/09/22 1734 03/10/22 0639  INR 1.1 1.1   Cardiac Enzymes: Recent Labs  Lab 03/10/22 0639  CKTOTAL 116   BNP (last 3 results) No results for input(s): "PROBNP" in the last 8760 hours. HbA1C: No results for input(s): "HGBA1C" in the last 72 hours. CBG: Recent Labs  Lab 03/11/22 1239  GLUCAP 164*   Lipid Profile: No results for input(s): "CHOL", "HDL", "LDLCALC", "TRIG", "CHOLHDL", "LDLDIRECT" in the last 72 hours. Thyroid Function Tests: No results for input(s): "TSH", "T4TOTAL", "FREET4", "T3FREE", "THYROIDAB" in the last 72 hours. Anemia Panel: No results for input(s): "VITAMINB12", "FOLATE", "FERRITIN", "TIBC", "IRON",  "RETICCTPCT" in the last 72 hours. Sepsis Labs: No results for input(s): "PROCALCITON", "LATICACIDVEN" in the last 168 hours.  No results found for this or any previous visit (from the past 240 hour(s)).       Radiology Studies: CT HEAD WO CONTRAST (5MM)  Result Date: 03/10/2022 CLINICAL DATA:  Mental status change, unknown cause Brain metastases suspected EXAM: CT HEAD WITHOUT CONTRAST TECHNIQUE: Contiguous axial images were obtained from the base of the skull through the vertex without intravenous contrast. RADIATION DOSE REDUCTION: This exam was performed according to the departmental dose-optimization program which includes automated exposure control, adjustment of the mA and/or kV according to patient size and/or use of iterative reconstruction technique. COMPARISON:  MRI head 03/09/2022, CT head 03/09/2022 FINDINGS: Brain: No evidence of large-territorial acute infarction. No parenchymal hemorrhage. Redemonstration of a right parietal hyperdense mass measuring up to 2.3 cm better evaluated on MRI head 03/09/2022. No extra-axial collection. Slight parietal sulci mass effect. Otherwise midline shift. No hydrocephalus. Basilar cisterns are patent. Vascular: No hyperdense vessel. Skull: No acute fracture or focal lesion. Sinuses/Orbits: Right maxillary sinus mucosal thickening. Otherwise paranasal sinuses and mastoid air cells are clear. The orbits are unremarkable. Other: None. IMPRESSION: Redemonstration of a right parietal hyperdense mass measuring up to  2.3 cm better evaluated on MRI head 03/09/2022. Electronically Signed   By: Iven Finn M.D.   On: 03/10/2022 18:52   EEG adult  Result Date: 03/10/2022 Lora Havens, MD     03/10/2022  1:27 PM Patient Name: KATILIN RAYNES MRN: [id number] Epilepsy Attending: Lora Havens Referring Physician/Provider: Rosalin Hawking, MD Date: 03/10/2022 Duration: 32.07 mins Patient history:  76 y.o. 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 s/p lobectomy,  chemotherapy and radiation, severe peripheral neuropathy status post IVIG presented to ED for code stroke (confusion, left arm/leg drift and ataxia), HCT c/f tumor. EEG to evaluate for seizure Level of alertness: Awake, asleep AEDs during EEG study: LEV Technical aspects: This EEG study was done with scalp electrodes positioned according to the 10-20 International system of electrode placement. Electrical activity was acquired at a sampling rate of '500Hz'$  and reviewed with a high frequency filter of '70Hz'$  and a low frequency filter of '1Hz'$ . EEG data were recorded continuously and digitally stored. Description: The posterior dominant rhythm consists of 8 Hz activity of moderate voltage (25-35 uV) seen predominantly in posterior head regions, symmetric and reactive to eye opening and eye closing. Sleep was characterized by vertex waves, sleep spindles (12 to 14 Hz), maximal frontocentral region. EEG showed continuous generalized and lateralized right hemisphere 3 to 6 Hz theta-delta slowing which at times appears sharply contoured. Physiologic photic driving was not seen during photic stimulation.  Hyperventilation was not performed.   ABNORMALITY - Continuous slow, generalized and lateralized right hemisphere IMPRESSION: This study is suggestive of cortical dysfunction in right hemisphere likely secondary to underlying structural abnormality.  Additionally there is mild to moderate diffuse encephalopathy, nonspecific etiology.  No seizures or epileptiform discharges were seen throughout the recording. Lora Havens   MR BRAIN W WO CONTRAST  Result Date: 03/09/2022 CLINICAL DATA:  Initial evaluation for metastatic disease. EXAM: MRI HEAD WITHOUT AND WITH CONTRAST TECHNIQUE: Multiplanar, multiecho pulse sequences of the brain and surrounding structures were obtained without and with intravenous contrast. CONTRAST:  15m GADAVIST GADOBUTROL 1 MMOL/ML IV SOLN COMPARISON:  Prior CT from earlier the same day. FINDINGS:  Brain: Age-related cerebral atrophy. Patchy and confluent T2/FLAIR hyperintensity involving the periventricular and deep white matter both cerebral hemispheres as well as the pons, most consistent with chronic small vessel ischemic disease, moderately advanced in nature. Irregular enhancing mass measuring 3.0 x 2.3 x 1.8 cm seen along the gray-white matter differentiation of the right parietal lobe (AP by craniocaudad by transverse). Surrounding T2/FLAIR signal intensity consistent with vasogenic edema without significant regional mass effect. Few scattered foci of internal susceptibility artifact consistent with blood products and/or necrosis. Findings most concerning for a possible metastatic lesion. Primary CNS neoplasm would be the primary differential consideration. No other definite lesions or abnormal enhancement elsewhere within the brain. No other mass effect or midline shift. Additionally, there is mildly increased diffusion signal abnormality involving the cortical gray matter about the right parietal region (series 5, image 31). Overall appearance is suggestive of possible superimposed changes of seizure. Similarly, mild diffusion signal abnormality seen at the right thalamus (series 5, image 23). This could also be related to seizure. A possible small evolving subacute ischemic infarct could also be considered. No significant associated enhancement about these locations. No other evidence for acute or subacute ischemia. Gray-white matter differentiation otherwise maintained. No other areas of chronic cortical infarction. No other acute or chronic intracranial blood products. Ventricles normal size without hydrocephalus. No extra-axial fluid collection. Pituitary  gland suprasellar region normal. Vascular: Major intracranial vascular flow voids are maintained. Asymmetric FLAIR signal intensity involving the left transverse and sigmoid sinus without definite T1 correlate, likely slow/sluggish flow. Skull  and upper cervical spine: Craniocervical junction with normal limits. Bone marrow signal intensity mildly heterogeneous without focal marrow replacing lesion. No scalp soft tissue abnormality. Sinuses/Orbits: Globes and orbital soft tissues within normal limits. Scattered mucosal thickening noted about the ethmoidal air cells and maxillary sinuses. Superimposed air-fluid level within the right maxillary sinus, consistent with acute sinusitis. Other: No mastoid effusion. IMPRESSION: 1. 3.0 x 2.3 x 1.8 cm irregular enhancing mass involving the right parietal lobe with localized vasogenic edema. 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 A primary CNS neoplasm would be the primary differential consideration. 2. Mild diffusion signal abnormality involving the cortical gray matter about the right parietal region and right thalamus, suspected to reflect superimposed changes of seizure. Correlation with history and EEG suggested. A possible small evolving subacute ischemic infarct at the right thalamus could be considered in the correct clinical setting, although is felt to be less likely. 3. Underlying age-related cerebral atrophy with moderately advanced chronic microvascular ischemic disease. Electronically Signed   By: Jeannine Boga M.D.   On: 03/09/2022 21:48   CT CHEST ABDOMEN PELVIS W CONTRAST  Result Date: 03/09/2022 CLINICAL DATA:  Brain metastases, unknown primary EXAM: CT CHEST, ABDOMEN, AND PELVIS WITH CONTRAST TECHNIQUE: Multidetector CT imaging of the chest, abdomen and pelvis was performed following the standard protocol during bolus administration of intravenous contrast. RADIATION DOSE REDUCTION: This exam was performed according to the departmental dose-optimization program which includes automated exposure control, adjustment of the mA and/or kV according to patient size and/or use of iterative reconstruction technique. CONTRAST:  159m OMNIPAQUE IOHEXOL 300 MG/ML  SOLN  COMPARISON:  None Available. FINDINGS: CT CHEST FINDINGS Cardiovascular: Moderate multi-vessel coronary artery calcification. Global cardiac size within normal limits. No pericardial effusion. Central pulmonary arteries are of normal caliber. Mild atherosclerotic calcification within the thoracic aorta. No aortic aneurysm Mediastinum/Nodes: No enlarged mediastinal, hilar, or axillary lymph nodes. Thyroid gland, trachea, and esophagus demonstrate no significant findings. Lungs/Pleura: Ground-glass opacity with associated traction bronchiolectasis within the right apex likely represents focal fibrotic change. Subpleural fibrotic change within the a anterior right upper lobe and right middle lobe likely represents post radiation change. The lungs are otherwise clear. No pneumothorax or pleural effusion. Central airways are widely patent. Musculoskeletal: No acute bone abnormality. No lytic or blastic bone lesion. Partial right mastectomy and axillary lymph node dissection has been performed. A indeterminate 9 mm nodule is seen within the a inferomedial right breast, not well characterized on this examination. CT ABDOMEN PELVIS FINDINGS Hepatobiliary: No focal liver abnormality is seen. No gallstones, gallbladder wall thickening, or biliary dilatation. Pancreas: Unremarkable Spleen: Unremarkable Adrenals/Urinary Tract: Adrenal glands are unremarkable. Kidneys are normal, without renal calculi, focal lesion, or hydronephrosis. Bladder is unremarkable. Stomach/Bowel: Stomach is within normal limits. Appendix appears normal. No evidence of bowel wall thickening, distention, or inflammatory changes. Vascular/Lymphatic: Aortic atherosclerosis. No enlarged abdominal or pelvic lymph nodes. Reproductive: Status post hysterectomy. No adnexal masses. Other: No abdominal wall hernia.  Rectum unremarkable. Musculoskeletal: No acute bone abnormality within the abdomen and pelvis. No lytic or blastic bone lesion. IMPRESSION: 1. No  evidence of primary or metastatic disease within the chest, abdomen, and pelvis. 2. Moderate multi-vessel coronary artery calcification. 3. 9 mm indeterminate nodule within the inferomedial right breast, not well characterized on this examination. Correlation with dedicated  breast imaging is recommended. Electronically Signed   By: Fidela Salisbury M.D.   On: 03/09/2022 19:03   CT HEAD CODE STROKE WO CONTRAST`  Result Date: 03/09/2022 CLINICAL DATA:  Code stroke. Mental status change, unknown cause. Left-sided weakness. Headache. EXAM: CT HEAD WITHOUT CONTRAST TECHNIQUE: Contiguous axial images were obtained from the base of the skull through the vertex without intravenous contrast. RADIATION DOSE REDUCTION: This exam was performed according to the departmental dose-optimization program which includes automated exposure control, adjustment of the mA and/or kV according to patient size and/or use of iterative reconstruction technique. COMPARISON:  No pertinent prior exams available for comparison. FINDINGS: Brain: Mild generalized parenchymal atrophy. 2.3 x 1.6 cm hyperdense mass within the right parietal lobe with mild surrounding vasogenic edema (for instance as seen on series 5, image 43). Background moderate patchy and ill-defined hypoattenuation within the cerebral white matter, nonspecific but compatible with chronic small vessel ischemic disease. There is no acute intracranial hemorrhage. No demarcated cortical infarct. No extra-axial fluid collection. No midline shift. Vascular: No hyperdense vessel. Atherosclerotic calcifications. Skull: No fracture or aggressive osseous lesion. Sinuses/Orbits: No mass or acute finding within the imaged orbits. Fluid within the inferior left frontal sinus. Minimal mucosal thickening within the bilateral ethmoid and sphenoid sinuses at the imaged levels. Other: Bilateral TMJ osteoarthrosis. ASPECTS ([place] Stroke Program Early CT Score) - Ganglionic level infarction  (caudate, lentiform nuclei, internal capsule, insula, M1-M3 cortex): 7 - Supraganglionic infarction (M4-M6 cortex): 3 Total score (0-10 with 10 being normal): 10 These results were called by telephone at the time of interpretation on 03/09/2022 at 5:10 pm to provider [facility] , who verbally acknowledged these results. IMPRESSION: 2.3 x 1.6 cm hyperdense mass within the right parietal lobe with mild surrounding vasogenic edema. This is most suspicious for an intracranial metastasis. A brain MRI without and with contrast is recommended for further evaluation. No evidence of acute infarct or acute intracranial hemorrhage. Moderate chronic small vessel ischemic changes within the cerebral white matter. Mild generalized parenchymal atrophy. Paranasal sinus disease, as described. Electronically Signed   By: Kellie Simmering D.O.   On: 03/09/2022 17:15        Scheduled Meds:  dexamethasone (DECADRON) injection  4 mg Intravenous Q12H   feeding supplement  237 mL Oral TID BM   ferrous sulfate  325 mg Oral Q breakfast   haloperidol lactate  1 mg Intravenous Once   levothyroxine  50 mcg Oral Daily   vitamin B-12  1,000 mcg Oral Daily   Continuous Infusions:  levETIRAcetam 500 mg (03/11/22 0523)     LOS: 2 days    Sidney Ace, MD [facility]   If 7PM-7AM, please contact night-coverage  03/11/2022, 12:54 PM

## 2022-03-11 NOTE — Progress Notes (Signed)
Inpatient Rehab Admissions Coordinator:  Consult received. Note pt underwent craniotomy for resection of brain tumor today and transferred to ICU. Also note PT awaits new orders to re-evaluate pt. Await re-evaluation from therapies to help determine appropriateness of CIR. Will continue to follow.   Gayland Curry, [place], [place] Admissions Coordinator [phone]

## 2022-03-11 NOTE — Progress Notes (Signed)
Hand off/shift report from [facility] RN indicated the pt transferred from Room 111 to the OR at 0631am by the transporter for a scheduled craniotomy

## 2022-03-11 NOTE — Progress Notes (Signed)
Dr Priscella Mann verbalized that the pt will transfer from PACU to ICU and not come back to Room 111

## 2022-03-11 NOTE — Plan of Care (Signed)
Neuro: neuro status improving, patient is now alert, smiling, and interactive, answering all questions and recalling information easily, speech is clear and appropriate, slight left sided weakness remains though improving, vision improving as well Resp: stable on room air CV: afebrile, vital signs stable GIGU: foley in place, tolerating sips of water/meds crushed in apple sauce Skin: intact, head incision not visible due to dressing, small amount of bloody drainage marked Social: Daughter and son-in-law at bedside, all questions and concerns addressed  Events: crani with tumor removal completed today without incident.  Problem: Education: Goal: Knowledge of General Education information will improve Description: Including pain rating scale, medication(s)/side effects and non-pharmacologic comfort measures Outcome: Progressing   Problem: Health Behavior/Discharge Planning: Goal: Ability to manage health-related needs will improve Outcome: Progressing   Problem: Clinical Measurements: Goal: Ability to maintain clinical measurements within normal limits will improve Outcome: Progressing Goal: Will remain free from infection Outcome: Progressing Goal: Diagnostic test results will improve Outcome: Progressing Goal: Respiratory complications will improve Outcome: Progressing Goal: Cardiovascular complication will be avoided Outcome: Progressing   Problem: Activity: Goal: Risk for activity intolerance will decrease Outcome: Progressing   Problem: Nutrition: Goal: Adequate nutrition will be maintained Outcome: Progressing   Problem: Coping: Goal: Level of anxiety will decrease Outcome: Progressing   Problem: Elimination: Goal: Will not experience complications related to bowel motility Outcome: Progressing Goal: Will not experience complications related to urinary retention Outcome: Progressing   Problem: Pain Managment: Goal: General experience of comfort will  improve Outcome: Progressing   Problem: Safety: Goal: Ability to remain free from injury will improve Outcome: Progressing   Problem: Skin Integrity: Goal: Risk for impaired skin integrity will decrease Outcome: Progressing

## 2022-03-11 NOTE — Anesthesia Preprocedure Evaluation (Signed)
Anesthesia Evaluation  Patient identified by MRN, date of birth, ID band Patient awake    Reviewed: Allergy & Precautions, H&P , NPO status , Patient's Chart, lab work & pertinent test results, reviewed documented beta blocker date and time   Airway Mallampati: III  TM Distance: >3 FB Neck ROM: full    Dental  (+) Dental Advidsory Given, Teeth Intact   Pulmonary neg pulmonary ROS, former smoker,    Pulmonary exam normal breath sounds clear to auscultation       Cardiovascular Exercise Tolerance: Good hypertension, (-) angina(-) Past MI and (-) Cardiac Stents Normal cardiovascular exam(-) dysrhythmias + Valvular Problems/Murmurs  Rhythm:regular Rate:Normal     Neuro/Psych  Headaches, Seizures -,  PSYCHIATRIC DISORDERS Anxiety  Neuromuscular disease    GI/Hepatic Neg liver ROS, GERD  ,  Endo/Other  negative endocrine ROS  Renal/GU negative Renal ROS  negative genitourinary   Musculoskeletal   Abdominal   Peds  Hematology negative hematology ROS (+)   Anesthesia Other Findings Past Medical History: No date: Allergy No date: Anemia No date: Arthritis     Comment:  "joints" (09/29/2013) No date: Breast cancer (HCC) No date: GERD (gastroesophageal reflux disease) No date: Heart murmur No date: History of blood transfusion     Comment:  "related to chemo/breast cancer" (09/29/2013) No date: Hypertension No date: Migraines No date: Personal history of chemotherapy No date: Personal history of radiation therapy 11/05/13-12/24/13: Radiation     Comment:  Right breast  No date: TMJ (temporomandibular joint syndrome)     Comment:  "left; just dx'd" (09/29/2013   Reproductive/Obstetrics negative OB ROS                             Anesthesia Physical Anesthesia Plan  ASA: 3  Anesthesia Plan: General   Post-op Pain Management:    Induction: Intravenous  PONV Risk Score and Plan: 3 and  Ondansetron, Dexamethasone, Midazolam and Treatment may vary due to age or medical condition  Airway Management Planned: Oral ETT  Additional Equipment:   Intra-op Plan:   Post-operative Plan: Extubation in OR and Possible Post-op intubation/ventilation  Informed Consent: I have reviewed the patients History and Physical, chart, labs and discussed the procedure including the risks, benefits and alternatives for the proposed anesthesia with the patient or authorized representative who has indicated his/her understanding and acceptance.     Dental Advisory Given  Plan Discussed with: Anesthesiologist, CRNA and Surgeon  Anesthesia Plan Comments:         Anesthesia Quick Evaluation

## 2022-03-11 NOTE — Transfer of Care (Signed)
Immediate Anesthesia Transfer of Care Note  Patient: Tabitha Ramirez  Procedure(s) Performed: CRANIOTOMY TUMOR EXCISION (Right)  Patient Location: PACU  Anesthesia Type:General  Level of Consciousness: awake  Airway & Oxygen Therapy: Patient Spontanous Breathing and Patient connected to face mask oxygen  Post-op Assessment: Report given to RN and Post -op Vital signs reviewed and stable  Post vital signs: Reviewed and stable  Last Vitals:  Vitals Value Taken Time  BP 146/77 03/11/22 1125  Temp    Pulse 71 03/11/22 1127  Resp 15 03/11/22 1127  SpO2 100 % 03/11/22 1127  Vitals shown include unvalidated device data.  Last Pain:  Vitals:   03/11/22 0539  TempSrc: Oral  PainSc:          Complications:  Encounter Notable Events  Notable Event Outcome Phase Comment  Difficult to intubate - expected  Intraprocedure Filed from anesthesia note documentation.

## 2022-03-11 NOTE — Op Note (Signed)
Indications: The patient is a 76yo female who presented with a brain tumor.  She had a seizure prior to presentation. Due to lack of other accessible sites for biopsy, open biopsy and resection was recommended.  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   Preoperative Note:   Risks of surgery discussed include: infection, bleeding, stroke, coma, death, paralysis, CSF leak, nerve/spinal cord injury, numbness, tingling, weakness, vascular injury, need for further surgery, persistent symptoms, and the risks of anesthesia. The patient and her daughter understood these risks and agreed to proceed.  NAME OF PROCEDURE:               1. Right Parietal Craniotomy for Resection of brain tumor 2. Use of stereotaxis for planning  PROCEDURE:  Patient was brought to the operating room, intubated. The mayfield pins were applied.  The patient was then positioned for a right-sided parietal craniotomy.  The stereotactic images were registered to the patient's skin so that it could be used for planning.  Mannitol, dexamethasone, and keppra were given.  The incision was planned, then prepped and draped in standard fashion.  The incision was opened sharply, then the galea opened.  A retractor was placed.   The craniotomy site was re-confirmed with stereotaxis.  A craniotomy was then fashioned with the burr and craniotome.  The dura was identified, then opened sharply.  The brain was visualized, and stereotactic guidance used to determine the cortical entry site.  The cortex was coagulated and entered.  Stereotactic guidance was used to confirm the operative approach to the tumor.  The tumor was identified, then dissected circumferentially until normal-appearing brain tissue was encountered.  The tissue was sent  off for pathology.  After confirming that full resection had been performed, we turned attention to closure.  The area was copiously irrigated.  Surgicel was placed along the resection cavity.  Hemostasis was then reconfirmed.  We then turned attention to closure.  The dura was reapproximated.  Tack up sutures were placed.  The craniotomy site was checked and Lorenz fixation plates used to reconstruct the skull.  The galea was closed.  A running monocryl was used on the skin.  A sterile dressing was placed.    Needle, lap and all counts were correct at the end of the case.      Meade Maw MD Neurosurgery

## 2022-03-11 NOTE — Anesthesia Procedure Notes (Signed)
Procedure Name: Intubation Date/Time: 03/11/2022 8:14 AM  Performed by: Aline Brochure, CRNAPre-anesthesia Checklist: Patient identified, Patient being monitored, Timeout performed, Emergency Drugs available and Suction available Patient Re-evaluated:Patient Re-evaluated prior to induction Oxygen Delivery Method: Circle system utilized Preoxygenation: Pre-oxygenation with 100% oxygen Induction Type: IV induction Ventilation: Mask ventilation without difficulty Laryngoscope Size: 3 and McGraph Grade View: Grade I Tube type: Oral Tube size: 7.0 mm Number of attempts: 1 Airway Equipment and Method: Stylet and Video-laryngoscopy Placement Confirmation: ETT inserted through vocal cords under direct vision, positive ETCO2 and breath sounds checked- equal and bilateral Secured at: 21 cm Tube secured with: Tape Dental Injury: Teeth and Oropharynx as per pre-operative assessment  Difficulty Due To: Difficult Airway- due to immobile epiglottis, Difficult Airway- due to anterior larynx, Difficult Airway- due to limited oral opening and Difficulty was anticipated

## 2022-03-12 ENCOUNTER — Inpatient Hospital Stay: Payer: Medicare PPO

## 2022-03-12 DIAGNOSIS — G9389 Other specified disorders of brain: Secondary | ICD-10-CM | POA: Diagnosis not present

## 2022-03-12 LAB — GLUCOSE, CAPILLARY: Glucose-Capillary: 127 mg/dL — ABNORMAL HIGH (ref 70–99)

## 2022-03-12 MED ORDER — LEVETIRACETAM 500 MG PO TABS
500.0000 mg | ORAL_TABLET | Freq: Two times a day (BID) | ORAL | Status: DC
  Administered 2022-03-12 – 2022-03-17 (×11): 500 mg via ORAL
  Filled 2022-03-12 (×11): qty 1

## 2022-03-12 MED ORDER — HYDROCODONE-ACETAMINOPHEN 5-325 MG PO TABS: 1.0000 | ORAL_TABLET | ORAL | Status: DC | PRN

## 2022-03-12 MED ORDER — GADOBUTROL 1 MMOL/ML IV SOLN
7.0000 mL | Freq: Once | INTRAVENOUS | Status: AC | PRN
  Administered 2022-03-12: 7 mL via INTRAVENOUS

## 2022-03-12 MED ORDER — DEXAMETHASONE 0.5 MG PO TABS
2.0000 mg | ORAL_TABLET | Freq: Two times a day (BID) | ORAL | Status: DC
  Administered 2022-03-12 – 2022-03-13 (×4): 2 mg via ORAL
  Filled 2022-03-12 (×5): qty 4

## 2022-03-12 MED ORDER — ENOXAPARIN SODIUM 40 MG/0.4ML IJ SOSY
40.0000 mg | PREFILLED_SYRINGE | INTRAMUSCULAR | Status: DC
  Administered 2022-03-12 – 2022-03-16 (×5): 40 mg via SUBCUTANEOUS
  Filled 2022-03-12 (×5): qty 0.4

## 2022-03-12 NOTE — Progress Notes (Signed)
Patient transported to MRI and transported back to ICU 10.

## 2022-03-12 NOTE — Progress Notes (Signed)
Patient back from MRI, no changes noted. VSS, will continue to monitor.

## 2022-03-12 NOTE — Progress Notes (Signed)
PROGRESS NOTE    Tabitha Ramirez  SNK:[id number] DOB: February 27, 1946 DOA: 03/09/2022 PCP: [facility]    Brief Narrative:   76 y.o. 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  Patient unable to provide history so majority of history gained from patient's son-in-law at bedside.   Patient last seen well at 2:45 PM on the day of admission.  Patient was dressing to pick up her grandchildren when she noted the hand shaking and could not get dressed.  Patient was found to be confused and presented to the emergency room.  Patient did present with left facial droop, left arm and leg drift, decree sensation with ataxia.   CT head showed right frontal parietal mass lesion likely metastases.  Neurology was called by EDP.  Code stroke was initially called.  Was canceled after CT findings were known.  Neurosurgery was contacted by EDP who recommended Decadron for vasogenic edema associated with presumptive cerebral metastasis.   Given the high likelihood of seizure as a result of the mass patient was loaded with Keppra 2000 mg in ED.  No further seizure activity noted  6/16: No clinical status changes overnight.  Slurred speech improving.  Patient remains somewhat confused.  Case discussed with neurosurgery Dr. Cari Caraway who discussed with primary oncologist.  Plan for craniotomy for tumor resection and tissue diagnosis.  Patient being consented for procedure today.  6/17: Patient seen and examined in ICU.  Discussed with neurosurgery.  Patient underwent right parietal craniectomy and resection of brain tumor this morning.  Tolerated procedure well  6/18: Patient doing well overall.  Symptomatically improving.  Okay for transfer to progressive unit    Assessment & Plan:   Principal  Problem:   Brain mass Active Problems:   Essential hypertension, benign   Anemia   Seizure (HCC)   Palliative care encounter  New brain mass with vasogenic edema Confusion Weakness This likely represents metastasis MRI brain with solitary mass consistent with CT CT chest abdomen pelvis with no evidence of metastatic disease Oncology, neurology, neurosurgery engaged Status post craniotomy and tumor resection 6/17 Plan: Transfer to floor Mobilize as tolerated PT OT ST consults SQ Lovenox for VTE prophylaxis As needed pain control Frequent neurochecks Case discussed with neurosurgery   Likely new onset seizure Likely secondary to brain mass Seen by neurology in ED Status post Keppra 2000 mg load Plan: Continue Keppra 500 mg IV twice daily Decadron as above, wean 2 mg every 12 hours Seizure precautions  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  Status post inpatient craniotomy  Level of care: Progressive  Consultants:  Neurosurgery Neurology Oncology Palliative care  Procedures:  Craniotomy 6/17  Antimicrobials: None   Subjective: Seen and examined.  More awake this morning  Objective: Vitals:   03/12/22 0500 03/12/22 0600 03/12/22 0700 03/12/22 0800  BP: (!) 93/57 108/61 111/66 114/65  Pulse: 74 78 68 67  Resp: 14 (!) 27  15 13  Temp:    97.8 F (36.6 C)  TempSrc:    Oral  SpO2: 99% 97% 98% 91%  Weight:      Height:         Intake/Output Summary (Last 24 hours) at 03/12/2022 1120 Last data filed at 03/12/2022 0800 Gross per 24 hour  Intake 1191.95 ml  Output 945 ml  Net 246.95 ml   Filed Weights   03/10/22 0000  Weight: 65.3 kg    Examination:  General exam: No acute distress Respiratory system: Clear to auscultation. Respiratory effort normal. Cardiovascular system: S1-S2, RRR, no murmurs, no pedal edema Gastrointestinal system: Thin, soft, NT/ND, normal bowel sounds Central nervous system: Alert and oriented.  Left-sided weakness Extremities: Symmetric 5 x 5 power.  Decreased power on left Skin: No rashes, lesions or ulcers Psychiatry: Flattened affect    Data Reviewed: I have personally reviewed following labs and imaging studies  CBC: Recent Labs  Lab 03/09/22 1537 03/10/22 0639  WBC 8.2 4.8  HGB 12.0 10.8*  HCT 36.4 32.0*  MCV 101.7* 98.2  PLT 238 354   Basic Metabolic Panel: Recent Labs  Lab 03/09/22 1537 03/10/22 0639  NA 141 139  K 4.1 3.7  CL 108 105  CO2 22 25  GLUCOSE 70 164*  BUN 28* 29*  CREATININE 0.95 0.93  CALCIUM 9.5 9.4   GFR: Estimated Creatinine Clearance: 45.8 mL/min (by C-G formula based on SCr of 0.93 mg/dL). Liver Function Tests: Recent Labs  Lab 03/09/22 1537  AST 33  ALT 27  ALKPHOS 84  BILITOT 1.2  PROT 8.3*  ALBUMIN 4.1   No results for input(s): "LIPASE", "AMYLASE" in the last 168 hours. No results for input(s): "AMMONIA" in the last 168 hours. Coagulation Profile: Recent Labs  Lab 03/09/22 1734 03/10/22 0639  INR 1.1 1.1   Cardiac Enzymes: Recent Labs  Lab 03/10/22 0639  CKTOTAL 116   BNP (last 3 results) No results for input(s): "PROBNP" in the last 8760 hours. HbA1C: No results for input(s): "HGBA1C" in the last 72 hours. CBG: Recent Labs  Lab 03/11/22 1239 03/12/22 0838  GLUCAP 164* 127*   Lipid Profile: No results for input(s): "CHOL", "HDL", "LDLCALC", "TRIG", "CHOLHDL", "LDLDIRECT" in the last 72  hours. Thyroid Function Tests: No results for input(s): "TSH", "T4TOTAL", "FREET4", "T3FREE", "THYROIDAB" in the last 72 hours. Anemia Panel: No results for input(s): "VITAMINB12", "FOLATE", "FERRITIN", "TIBC", "IRON", "RETICCTPCT" in the last 72 hours. Sepsis Labs: No results for input(s): "PROCALCITON", "LATICACIDVEN" in the last 168 hours.  Recent Results (from the past 240 hour(s))  MRSA Next Gen by PCR, Nasal     Status: None   Collection Time: 03/11/22 12:41 PM   Specimen: Nasal Mucosa; Nasal Swab  Result Value Ref Range Status   MRSA by PCR Next Gen NOT DETECTED NOT DETECTED Final    Comment: (NOTE) The GeneXpert MRSA Assay (FDA approved for NASAL specimens only), is one component of a comprehensive MRSA colonization surveillance program. It is not intended to diagnose MRSA infection nor to guide or monitor treatment for MRSA infections. Test performance is not FDA approved in patients less than 73 years old. Performed at [facility], [address] [place], [place] [address]          Radiology Studies: MR BRAIN W WO CONTRAST  Result Date: 03/12/2022 CLINICAL DATA:  Follow-up examination status post craniotomy for tumor resection. EXAM: MRI HEAD WITHOUT AND WITH CONTRAST TECHNIQUE: Multiplanar, multiecho pulse sequences of the  brain and surrounding structures were obtained without and with intravenous contrast. CONTRAST:  46m GADAVIST GADOBUTROL 1 MMOL/ML IV SOLN COMPARISON:  Recent MRI from 03/09/2022. FINDINGS: Brain: Postoperative changes from interval right parietal craniotomy for tumor resection are seen. Postoperative blood products with a probable small focus of pneumocephalus present within the resection cavity. Previously seen enhancing mass has been resected. Few irregular foci of enhancement at the inferior aspect of the resection cavity favored to be postoperative and/or vascular in nature (series 18, image 100). There has been gross total resection of  the tumor, with no definite residual tumor seen on this immediate postoperative examination. Approximate 2 cm focus of restricted diffusion at the deep aspect of the resection cavity consistent with a small Peri resection infarct (series 6, image 29). Normal expected overlying postoperative dural thickening and enhancement at the right parietal convexity. Trace 2 mm subdural collection overlies the posterior right parietooccipital convexity without mass effect (series 15, image 32). Previously seen surrounding diffusion signal abnormality within the adjacent right parieto-occipital cortex has largely resolved. Again, this was likely related to superimposed changes of seizure on previous exam. There does remain residual mildly increased diffusion signal at the right thalamus (series 5, image 23). No associated enhancement. This remains indeterminate. Remainder the brain is otherwise unchanged in appearance with no other mass lesion or abnormal enhancement. No hydrocephalus or midline shift. Underlying atrophy with chronic microvascular ischemic disease again noted. No other mass lesion or abnormal enhancement elsewhere within the brain. Vascular: Major intracranial vascular flow voids are preserved. Skull and upper cervical spine: Craniocervical junction within normal limits. Bone marrow signal intensity normal. Post craniotomy changes noted at the right parietal calvarium without adverse features. Sinuses/Orbits: Globes and orbital soft tissues within normal limits. Acute right maxillary sinusitis noted. Mastoid air cells are clear. Other: None. IMPRESSION: 1. Postoperative changes from interval right parietal craniotomy for tumor resection. There has been gross total resection of the tumor, with no definite residual tumor seen on this immediate postoperative examination. 2. Approximate 2 cm focus of restricted diffusion at the deep aspect of the resection cavity, consistent with a small acute ischemic infarct. 3.  Previously seen surrounding diffusion signal abnormality within the adjacent right parieto-occipital cortex has resolved. Again, this was likely related to superimposed seizure on prior exam. 4. Persistent mild diffusion signal abnormality at the right thalamus, indeterminate, but similar to prior. Attention at follow-up recommended. 5. Acute right maxillary sinusitis. Electronically Signed   By: BJeannine BogaM.D.   On: 03/12/2022 04:30   CT HEAD WO CONTRAST (5MM)  Result Date: 03/10/2022 CLINICAL DATA:  Mental status change, unknown cause Brain metastases suspected EXAM: CT HEAD WITHOUT CONTRAST TECHNIQUE: Contiguous axial images were obtained from the base of the skull through the vertex without intravenous contrast. RADIATION DOSE REDUCTION: This exam was performed according to the departmental dose-optimization program which includes automated exposure control, adjustment of the mA and/or kV according to patient size and/or use of iterative reconstruction technique. COMPARISON:  MRI head 03/09/2022, CT head 03/09/2022 FINDINGS: Brain: No evidence of large-territorial acute infarction. No parenchymal hemorrhage. Redemonstration of a right parietal hyperdense mass measuring up to 2.3 cm better evaluated on MRI head 03/09/2022. No extra-axial collection. Slight parietal sulci mass effect. Otherwise midline shift. No hydrocephalus. Basilar cisterns are patent. Vascular: No hyperdense vessel. Skull: No acute fracture or focal lesion. Sinuses/Orbits: Right maxillary sinus mucosal thickening. Otherwise paranasal sinuses and mastoid air cells are clear. The orbits are unremarkable. Other: None. IMPRESSION: Redemonstration of  a right parietal hyperdense mass measuring up to 2.3 cm better evaluated on MRI head 03/09/2022. Electronically Signed   By: Iven Finn M.D.   On: 03/10/2022 18:52   EEG adult  Result Date: 03/10/2022 Lora Havens, MD     03/10/2022  1:27 PM Patient Name: Tabitha Ramirez MRN:  [id number] Epilepsy Attending: Lora Havens Referring Physician/Provider: Rosalin Hawking, MD Date: 03/10/2022 Duration: 32.07 mins Patient history:  76 y.o. 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 EEG to evaluate for seizure Level of alertness: Awake, asleep AEDs during EEG study: LEV Technical aspects: This EEG study was done with scalp electrodes positioned according to the 10-20 International system of electrode placement. Electrical activity was acquired at a sampling rate of '500Hz'$  and reviewed with a high frequency filter of '70Hz'$  and a low frequency filter of '1Hz'$ . EEG data were recorded continuously and digitally stored. Description: The posterior dominant rhythm consists of 8 Hz activity of moderate voltage (25-35 uV) seen predominantly in posterior head regions, symmetric and reactive to eye opening and eye closing. Sleep was characterized by vertex waves, sleep spindles (12 to 14 Hz), maximal frontocentral region. EEG showed continuous generalized and lateralized right hemisphere 3 to 6 Hz theta-delta slowing which at times appears sharply contoured. Physiologic photic driving was not seen during photic stimulation.  Hyperventilation was not performed.   ABNORMALITY - Continuous slow, generalized and lateralized right hemisphere IMPRESSION: This study is suggestive of cortical dysfunction in right hemisphere likely secondary to underlying structural abnormality.  Additionally there is mild to moderate diffuse encephalopathy, nonspecific etiology.  No seizures or epileptiform discharges were seen throughout the recording. Priyanka Barbra Sarks        Scheduled Meds:  Chlorhexidine Gluconate Cloth  6 each Topical Daily   dexamethasone  2 mg Oral Q12H   enoxaparin (LOVENOX) injection  40 mg Subcutaneous Q24H   feeding  supplement  237 mL Oral TID BM   ferrous sulfate  325 mg Oral Q breakfast   haloperidol lactate  1 mg Intravenous Once   levETIRAcetam  500 mg Oral BID   levothyroxine  50 mcg Oral Daily   vitamin B-12  1,000 mcg Oral Daily   Continuous Infusions:  sodium chloride 75 mL/hr at 03/12/22 0800     LOS: 3 days    Sidney Ace, MD [facility]   If 7PM-7AM, please contact night-coverage  03/12/2022, 11:20 AM

## 2022-03-12 NOTE — Progress Notes (Signed)
    Attending Progress Note  History: Tabitha Ramirez is here for a brain tumor  POD1: Doing well. Symptomatically improved.  Physical Exam: Vitals:   03/12/22 0700 03/12/22 0800  BP: 111/66 114/65  Pulse: 68 67  Resp: 15 13  Temp:  97.8 F (36.6 C)  SpO2: 98% 91%    AA Ox3 CNI VF appear improved  Strength:5/5 throughout BUE and BLE  Data:  Recent Labs  Lab 03/09/22 1537 03/10/22 0639  NA 141 139  K 4.1 3.7  CL 108 105  CO2 22 25  BUN 28* 29*  CREATININE 0.95 0.93  GLUCOSE 70 164*  CALCIUM 9.5 9.4   Recent Labs  Lab 03/09/22 1537  AST 33  ALT 27  ALKPHOS 84     Recent Labs  Lab 03/09/22 1537 03/10/22 0639  WBC 8.2 4.8  HGB 12.0 10.8*  HCT 36.4 32.0*  PLT 238 236   Recent Labs  Lab 03/09/22 1734 03/10/22 0639  APTT 27  --   INR 1.1 1.1         Other tests/results: MRI shows gross total resection  Assessment/Plan:  Tabitha Ramirez is doing well after brain tumor resection  - mobilize - pain control - DVT prophylaxis - PTOT - Wean dexamethasone - Neuro checks q4 hours   Meade Maw MD, [facility] Department of Neurosurgery

## 2022-03-12 NOTE — Progress Notes (Signed)
Neurology Progress Note  Patient ID: Tabitha Ramirez is a 76 y.o. 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 s/p lobectomy, chemotherapy and radiation, severe peripheral neuropathy status post IVIG presented to ED for code stroke (confusion, left arm/leg drift and ataxia), HCT c/f tumor, confirmed on MRI brain, started on keppra    Major interval events/Subjective: -s/p gross total resection of tumor on 6/17 -mildly confused this morning, reports slight left sided headache but otherwise no acute complaints  Exam: Current vital signs: BP 114/65 (BP Location: Left Arm)   Pulse 67   Temp 97.8 F (36.6 C) (Oral)   Resp 13   Ht '5\' 4"'$  (1.626 m)   Wt 65.3 kg   SpO2 91%   BMI 24.72 kg/m  Vital signs in last 24 hours: Temp:  [96.4 F (35.8 C)-98.9 F (37.2 C)] 97.8 F (36.6 C) (06/18 0800) Pulse Rate:  [62-85] 67 (06/18 0800) Resp:  [13-33] 13 (06/18 0800) BP: (93-137)/(51-75) 114/65 (06/18 0800) SpO2:  [91 %-100 %] 91 % (06/18 0800)   Gen: In bed, comfortable, thin but no acute distress Resp: non-labored breathing, no grossly audible wheezing Cardiac: Perfusing extremities well  Abd: soft, nt  Neuro:  MS: Slowed responses, alert, awake, oriented to location/situation, reports that she is 75, oriented to month and year. Mild neglect of the left side, intermittent. Using her knife as a fork and initially reports it is a fork but then able to correct herself and select a fork to feed herself when I tell her she is using a knife. Slightly confused about plan of care, asks if she needs more surgery.  CN: VFF to confrontation but at times difficultly paying attention to the left, pupils equal round reactive to light, mild left facial droop, tongue midline, EOMI today  Motor: Some mild left-sided motor neglect, but strength is symmetric today (mild bilateral 4+/5 deltoid weakness). Confrontational motor testing slightly limited by confusion but 5/5 elbow flexion / extension, hip flexion, knee  extension. Tends to keep left hand in a fist but able to fully open hand. Parietal drift of LUE (sometimes up, sometimes out, difficult to supinate entirely) Sensory: Intact to light touch in all four extremities   Pertinent Data:  Basic Metabolic Panel: Recent Labs  Lab 03/09/22 1537 03/10/22 0639  NA 141 139  K 4.1 3.7  CL 108 105  CO2 22 25  GLUCOSE 70 164*  BUN 28* 29*  CREATININE 0.95 0.93  CALCIUM 9.5 9.4     CBC: Recent Labs  Lab 03/09/22 1537 03/10/22 0639  WBC 8.2 4.8  HGB 12.0 10.8*  HCT 36.4 32.0*  MCV 101.7* 98.2  PLT 238 236     Coagulation Studies: Recent Labs    03/09/22 1734 03/10/22 0639  LABPROT 13.9 13.8  INR 1.1 1.1    Ct Chest/Ab/Pelvis w/ contrast 1. No evidence of primary or metastatic disease within the chest, abdomen, and pelvis. 2. Moderate multi-vessel coronary artery calcification. 3. 9 mm indeterminate nodule within the inferomedial right breast, not well characterized on this examination. Correlation with dedicated breast imaging is recommended.  MRI brain 6/15 previously personally reviewed, agree with radiology:   1. 3.0 x 2.3 x 1.8 cm irregular enhancing mass involving the right parietal lobe with localized vasogenic edema. 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 A primary CNS neoplasm would be the primary differential consideration. 2. Mild diffusion signal abnormality involving the cortical gray matter about the right parietal region and right thalamus, suspected to  reflect superimposed changes of seizure. Correlation with history and EEG suggested. A possible small evolving subacute ischemic infarct at the right thalamus could be considered in the correct clinical setting, although is felt to be less likely. 3. Underlying age-related cerebral atrophy with moderately advanced chronic microvascular ischemic disease.  MRI brain 6/18 personally reviewed, agree with radiology: 1. Postoperative changes  from interval right parietal craniotomy for tumor resection. There has been gross total resection of the tumor, with no definite residual tumor seen on this immediate postoperative examination. 2. Approximate 2 cm focus of restricted diffusion at the deep aspect of the resection cavity, consistent with a small acute ischemic infarct. 3. Previously seen surrounding diffusion signal abnormality within the adjacent right parieto-occipital cortex has resolved. Again, this was likely related to superimposed seizure on prior exam. 4. Persistent mild diffusion signal abnormality at the right thalamus, indeterminate, but similar to prior. Attention at follow-up recommended. 5. Acute right maxillary sinusitis.  EEG 6/16 ABNORMALITY - Continuous slow, generalized and lateralized right hemisphere IMPRESSION: This study is suggestive of cortical dysfunction in right hemisphere likely secondary to underlying structural abnormality.  Additionally there is mild to moderate diffuse encephalopathy, nonspecific etiology.  No seizures or epileptiform discharges were seen throughout the recording.  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 Would continue AEDs at this time, with close neurology follow-up outpatient. Will also need serial imaging to evaluate thalamic lesion which may be contributing to her confusion. Ischemia is likely related to resection and would not merit further workup at this time.   Recommendations: - Appreciate neurosurgery following and managing steroids and post-op care - s/p Keppra 2000 mg load, now on 500 mg BID, continue on discharge - Seizure precautions reviewed but will need to be reiterated with patient and family given persistent confusion, please include text below in discharge instructions - Neurology will follow to monitor mental status   Standard seizure precautions: Per [facility]  statutes, patients with seizures are not allowed to drive until  they have been seizure-free for six months. Use caution when using heavy equipment or power tools. Avoid working on ladders or at heights. Take showers instead of baths. Ensure the water temperature is not too high on the home water heater. Do not go swimming alone. When caring for infants or small children, sit down when holding, feeding, or changing them to minimize risk of injury to the child in the event you have a seizure.  To reduce risk of seizures, maintain good sleep hygiene avoid alcohol and illicit drug use, take all anti-seizure medications as prescribed.    Lesleigh Noe MD-PhD [facility] [phone]

## 2022-03-12 NOTE — Plan of Care (Signed)
Neuro: Neuro status continues to improve, some residual left sided weakness of both upper and lower extremities, some delayed responses and forgetfulness but answers all orientation questions appropriately and recalls new and old information pertinent to her life prior to this event. She is also thinking forward about changes in driving and living situation due to this event.  Resp: stable on room air CV: afebrile,vital signs stable, no edema GIGU: foley in place, tolerating PO well, no complaints Skin: intact; surgical dressing unchanged from day of post op, no new drainage visible Social: Daughter, [name], and partner visiting throughout the day, updates provided, all questions and concerns addressed  Problem: Education: Goal: Knowledge of General Education information will improve Description: Including pain rating scale, medication(s)/side effects and non-pharmacologic comfort measures Outcome: Progressing   Problem: Health Behavior/Discharge Planning: Goal: Ability to manage health-related needs will improve Outcome: Progressing   Problem: Clinical Measurements: Goal: Ability to maintain clinical measurements within normal limits will improve Outcome: Progressing Goal: Will remain free from infection Outcome: Progressing Goal: Diagnostic test results will improve Outcome: Progressing Goal: Respiratory complications will improve Outcome: Progressing Goal: Cardiovascular complication will be avoided Outcome: Progressing   Problem: Activity: Goal: Risk for activity intolerance will decrease Outcome: Progressing   Problem: Nutrition: Goal: Adequate nutrition will be maintained Outcome: Progressing   Problem: Coping: Goal: Level of anxiety will decrease Outcome: Progressing   Problem: Elimination: Goal: Will not experience complications related to bowel motility Outcome: Progressing Goal: Will not experience complications related to urinary retention Outcome:  Progressing   Problem: Pain Managment: Goal: General experience of comfort will improve Outcome: Progressing   Problem: Safety: Goal: Ability to remain free from injury will improve Outcome: Progressing   Problem: Skin Integrity: Goal: Risk for impaired skin integrity will decrease Outcome: Progressing

## 2022-03-13 ENCOUNTER — Encounter: Payer: Self-pay | Admitting: Neurosurgery

## 2022-03-13 DIAGNOSIS — G9389 Other specified disorders of brain: Secondary | ICD-10-CM | POA: Diagnosis not present

## 2022-03-13 LAB — GLUCOSE, CAPILLARY: Glucose-Capillary: 137 mg/dL — ABNORMAL HIGH (ref 70–99)

## 2022-03-13 MED ORDER — ORAL CARE MOUTH RINSE: 15.0000 mL | OROMUCOSAL | Status: DC | PRN

## 2022-03-13 NOTE — Evaluation (Signed)
Physical Therapy Evaluation Patient Details Name: Tabitha Ramirez MRN: [id number] DOB: 31-Aug-1946 Today's Date: 03/13/2022  History of Present Illness  Pt is a 76 year old female admitetd with likely seizure with newly diagnosed lesion  in the right parietal lobe after presenting to the ED with hand shaking, confusion, and disorientation. 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   Clinical Impression  Patient seen for re-evaluation following brain tumor resection. The patient continues to have sensory deficits with left side that impact functional independence with mobility. Mod A +2 person required for sit to stand transfers as well as for ambulation. Gait is ataxic with facilitation for weight shifting and cues required for step length and foot placement for LLE. The patient would benefit from continued PT to maximize independence and facilitate return to prior level of function. Acute inpatient rehab is recommended at discharge.      Recommendations for follow up therapy are one component of a multi-disciplinary discharge planning process, led by the attending physician.  Recommendations may be updated based on patient status, additional functional criteria and insurance authorization.  Follow Up Recommendations Acute inpatient rehab (3hours/day)    Assistance Recommended at Discharge Frequent or constant Supervision/Assistance  Patient can return home with the following  A lot of help with walking and/or transfers;A lot of help with bathing/dressing/bathroom    Equipment Recommendations  (to be determined at next leve of care)  Recommendations for Other Services       Functional Status Assessment Patient has had a recent decline in their functional status and demonstrates the ability to  make significant improvements in function in a reasonable and predictable amount of time.     Precautions / Restrictions Precautions Precautions: Fall Restrictions Weight Bearing Restrictions: No      Mobility  Bed Mobility Overal bed mobility: Needs Assistance Bed Mobility: Supine to Sit, Sit to Supine     Supine to sit: Min guard          Transfers Overall transfer level: Needs assistance Equipment used: 2 person hand held assist Transfers: Sit to/from Stand Sit to Stand: Mod assist, +2 physical assistance           General transfer comment: cues for foot placement, hand placement with hand over hand required for positioning for LLE and LUE. lifting assistance and facilitation required for anterior translation    Ambulation/Gait Ambulation/Gait assistance: Mod assist, +2 physical assistance Gait Distance (Feet): 40 Feet Assistive device: 2 person hand held assist Gait Pattern/deviations: Ataxic, Decreased stride length, Narrow base of support Gait velocity: decreased     General Gait Details: inconsistent step length with LLE with ataxia noted. facilation for weight shifting to right for advancement of LLE. gait pattern worsens with fatigue despite cues.  Stairs            Wheelchair Mobility    Modified Rankin (Stroke Patients Only)       Balance Overall balance assessment: Needs assistance Sitting-balance support: No upper extremity supported, Feet supported Sitting balance-Leahy Scale: Fair     Standing balance support: Bilateral upper extremity supported Standing balance-Leahy Scale: Poor Standing balance comment: external support required for standing with mild left lean with static standing that is more pronounced with dynamic activity  Pertinent Vitals/Pain Pain Assessment Pain Assessment: No/denies pain    Home Living Family/patient expects to be discharged to:: Private residence                         Prior Function Prior Level of Function : Independent/Modified Independent             Mobility Comments: Ambulatory without assist device; assisted with care for 80 year old grandchild       Hand Dominance   Dominant Hand: Right    Extremity/Trunk Assessment   Upper Extremity Assessment Upper Extremity Assessment: Defer to OT evaluation    Lower Extremity Assessment Lower Extremity Assessment: RLE deficits/detail;LLE deficits/detail LLE Deficits / Details: left knee extension 5/5, dorsiflexion 5/5. LLE Sensation: decreased light touch (absent to decreased proximally, decreased distally) LLE Coordination: decreased fine motor;decreased gross motor (heel to shin with mild dysmetria. decreased coorindation and smoothness step length/foot placement while ambulating)       Communication   Communication: No difficulties  Cognition Arousal/Alertness: Awake/alert Behavior During Therapy: Flat affect Overall Cognitive Status: Impaired/Different from baseline Area of Impairment: Orientation, Following commands, Safety/judgement, Problem solving                 Orientation Level: Disoriented to, Time (she reports today is Saturday)     Following Commands: Follows one step commands consistently, Follows one step commands with increased time Safety/Judgement: Decreased awareness of deficits, Decreased awareness of safety   Problem Solving: Slow processing, Difficulty sequencing, Requires verbal cues, Requires tactile cues          General Comments      Exercises     Assessment/Plan    PT Assessment Patient needs continued PT services  PT Problem List Decreased strength;Decreased range of motion;Decreased activity tolerance;Decreased balance;Decreased coordination;Decreased cognition;Decreased knowledge of use of DME;Decreased safety awareness;Decreased mobility;Decreased knowledge of precautions;Impaired sensation       PT Treatment  Interventions DME instruction;Gait training;Stair training;Functional mobility training;Therapeutic activities;Therapeutic exercise;Balance training;Patient/family education;Cognitive remediation;Neuromuscular re-education    PT Goals (Current goals can be found in the Care Plan section)  Acute Rehab PT Goals Patient Stated Goal: to walk PT Goal Formulation: With patient Time For Goal Achievement: 03/27/22 Potential to Achieve Goals: Good    Frequency 7X/week     Co-evaluation PT/OT/SLP Co-Evaluation/Treatment: Yes Reason for Co-Treatment: To address functional/ADL transfers;Complexity of the patient's impairments (multi-system involvement) PT goals addressed during session: Mobility/safety with mobility         AM-PAC PT "6 Clicks" Mobility  Outcome Measure Help needed turning from your back to your side while in a flat bed without using bedrails?: A Little Help needed moving from lying on your back to sitting on the side of a flat bed without using bedrails?: A Little Help needed moving to and from a bed to a chair (including a wheelchair)?: A Lot Help needed standing up from a chair using your arms (e.g., wheelchair or bedside chair)?: A Lot Help needed to walk in hospital room?: Total Help needed climbing 3-5 steps with a railing? : Total 6 Click Score: 12    End of Session Equipment Utilized During Treatment: Gait belt Activity Tolerance: Patient tolerated treatment well Patient left: in chair;with call bell/phone within reach Nurse Communication: Mobility status PT Visit Diagnosis: Muscle weakness (generalized) (M62.81);Difficulty in walking, not elsewhere classified (R26.2);Hemiplegia and hemiparesis Hemiplegia - Right/Left: Left Hemiplegia - dominant/non-dominant: Non-dominant Hemiplegia - caused by: Other cerebrovascular disease    Time: 0922-0954 PT  Time Calculation (min) (ACUTE ONLY): 32 min   Charges:   PT Evaluation $PT Re-evaluation: 1 Re-eval PT  Treatments $Gait Training: 8-22 mins        Minna Merritts, PT, MPT   Percell Locus 03/13/2022, 11:43 AM

## 2022-03-13 NOTE — Anesthesia Postprocedure Evaluation (Signed)
Anesthesia Post Note  Patient: [name]  Procedure(s) Performed: CRANIOTOMY TUMOR EXCISION (Right)  Patient location during evaluation: ICU Anesthesia Type: General Level of consciousness: awake Pain management: pain level controlled Vital Signs Assessment: post-procedure vital signs reviewed and stable Respiratory status: spontaneous breathing and respiratory function stable Cardiovascular status: stable Postop Assessment: no apparent nausea or vomiting Anesthetic complications: no   Encounter Notable Events  Notable Event Outcome Phase Comment  Difficult to intubate - expected  Intraprocedure Filed from anesthesia note documentation.     Last Vitals:  Vitals:   03/13/22 0800 03/13/22 0900  BP: 132/75   Pulse: 62 64  Resp: 15 13  Temp: 36.9 C   SpO2: 99% 100%    Last Pain:  Vitals:   03/13/22 0800  TempSrc: Oral  PainSc: 1                  Makeila Yamaguchi,  Clearnce Sorrel

## 2022-03-13 NOTE — Progress Notes (Signed)
Subjective: No issues overnight, no concern for seizures.   Exam: Vitals:   03/13/22 0400 03/13/22 0600  BP: 134/69 140/71  Pulse: 64 68  Resp: 17 16  Temp:    SpO2: 99% 97%   Gen: In bed, NAD Resp: non-labored breathing, no acute distress Abd: soft, nt  Neuro: MS: awake, alert. She is positioned to the left in bed as can be seen with neglect, but does not extinguish to sensory stimuli. Oriented to month, age, year.  CN:VFF, eomi Motor: no drift in either upper extremity, ? Slightly less effort in LLE but not consistent  Pertinent Labs: Cr 0.93  Impression: 76 yo F presenting with acute confusion and neglect as code stroke. She was fund to have brain metastasis now s/p tumpr resection on /17. Unclear what the mild restricted diffusion on MRI signifies, but I doubt ischemic etiology, more likely a sequela of seizure activity. Given the presentation, I would favor continued antiepileptic therapy for now. Post-op confusion is not particularly unexpected in this situation and I would expect gradual improvement as we are seeing.   Recommendations: 1) continue keppra '500mg'$  BID 2) follow up with outpatient neurology, we will be available as needed moving forward.   Roland Rack, MD [facility] [phone]  If 7pm- 7am, please page neurology on call as listed in [facility].

## 2022-03-13 NOTE — Progress Notes (Signed)
PROGRESS NOTE    Tabitha Ramirez  GXQ:[id number] DOB: 01/26/1946 DOA: 03/09/2022 PCP: [facility]    Brief Narrative:   76 y.o. 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  Patient unable to provide history so majority of history gained from patient's son-in-law at bedside.   Patient last seen well at 2:45 PM on the day of admission.  Patient was dressing to pick up her grandchildren when she noted the hand shaking and could not get dressed.  Patient was found to be confused and presented to the emergency room.  Patient did present with left facial droop, left arm and leg drift, decree sensation with ataxia.   CT head showed right frontal parietal mass lesion likely metastases.  Neurology was called by EDP.  Code stroke was initially called.  Was canceled after CT findings were known.  Neurosurgery was contacted by EDP who recommended Decadron for vasogenic edema associated with presumptive cerebral metastasis.   Given the high likelihood of seizure as a result of the mass patient was loaded with Keppra 2000 mg in ED.  No further seizure activity noted  6/16: No clinical status changes overnight.  Slurred speech improving.  Patient remains somewhat confused.  Case discussed with neurosurgery Dr. Cari Caraway who discussed with primary oncologist.  Plan for craniotomy for tumor resection and tissue diagnosis.  Patient being consented for procedure today.  6/17: Patient seen and examined in ICU.  Discussed with neurosurgery.  Patient underwent right parietal craniectomy and resection of brain tumor this morning.  Tolerated procedure well  6/18: Patient doing well overall.  Symptomatically improving.  Okay for transfer to progressive unit    Assessment & Plan:   Principal  Problem:   Brain mass Active Problems:   Essential hypertension, benign   Anemia   Seizure (HCC)   Palliative care encounter  New brain mass with vasogenic edema Confusion Weakness This likely represents metastasis MRI brain with solitary mass consistent with CT CT chest abdomen pelvis with no evidence of metastatic disease Oncology, neurology, neurosurgery engaged Status post craniotomy and tumor resection 6/17 Plan: Transfer to floor Mobilize as tolerated PT OT ST consults SQ Lovenox for VTE prophylaxis As needed pain control Frequent neurochecks Oncology/rad onc followup   Likely new onset seizure Likely secondary to brain mass Seen by neurology in ED Status post Keppra 2000 mg load Plan: Continue Keppra 500 mg PO BID Decadron as above, 2 mg every 12 hours.  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  Status post inpatient craniotomy.  Pending path  Level of care: Progressive  Consultants:  Neurosurgery Neurology Oncology Palliative care  Procedures:  Craniotomy 6/17  Antimicrobials: None   Subjective: Seen and examined.  More awake this morning  Objective: Vitals:   03/13/22 0600 03/13/22 0700 03/13/22 0800 03/13/22 0900  BP: 140/71  132/75   Pulse: 68 67 62 64  Resp:  $'16 17 15 13  'z$ Temp:   98.4 F (36.9 C)   TempSrc:   Oral   SpO2: 97% 100% 99% 100%  Weight:      Height:        Intake/Output  Summary (Last 24 hours) at 03/13/2022 1344 Last data filed at 03/13/2022 0600 Gross per 24 hour  Intake 720 ml  Output 1100 ml  Net -380 ml   Filed Weights   03/10/22 0000  Weight: 65.3 kg    Examination:  General exam: NAD Respiratory system: Lungs clear.  Normal work of breathing.  Room air Cardiovascular system: S1-S2, RRR, no murmurs, no pedal edema Gastrointestinal system: Thin, soft, NT/ND, normal bowel sounds Central nervous system: Alert and oriented.  Left-sided weakness Extremities: Symmetric 5 x 5 power.  Decreased power on left Skin: No rashes, lesions or ulcers Psychiatry: Flattened affect    Data Reviewed: I have personally reviewed following labs and imaging studies  CBC: Recent Labs  Lab 03/09/22 1537 03/10/22 0639  WBC 8.2 4.8  HGB 12.0 10.8*  HCT 36.4 32.0*  MCV 101.7* 98.2  PLT 238 496   Basic Metabolic Panel: Recent Labs  Lab 03/09/22 1537 03/10/22 0639  NA 141 139  K 4.1 3.7  CL 108 105  CO2 22 25  GLUCOSE 70 164*  BUN 28* 29*  CREATININE 0.95 0.93  CALCIUM 9.5 9.4   GFR: Estimated Creatinine Clearance: 45.8 mL/min (by C-G formula based on SCr of 0.93 mg/dL). Liver Function Tests: Recent Labs  Lab 03/09/22 1537  AST 33  ALT 27  ALKPHOS 84  BILITOT 1.2  PROT 8.3*  ALBUMIN 4.1   No results for input(s): "LIPASE", "AMYLASE" in the last 168 hours. No results for input(s): "AMMONIA" in the last 168 hours. Coagulation Profile: Recent Labs  Lab 03/09/22 1734 03/10/22 0639  INR 1.1 1.1   Cardiac Enzymes: Recent Labs  Lab 03/10/22 0639  CKTOTAL 116   BNP (last 3 results) No results for input(s): "PROBNP" in the last 8760 hours. HbA1C: No results for input(s): "HGBA1C" in the last 72 hours. CBG: Recent Labs  Lab 03/11/22 1239 03/12/22 0838 03/13/22 0730  GLUCAP 164* 127* 137*   Lipid Profile: No results for input(s): "CHOL", "HDL", "LDLCALC", "TRIG", "CHOLHDL", "LDLDIRECT" in the last 72 hours. Thyroid Function  Tests: No results for input(s): "TSH", "T4TOTAL", "FREET4", "T3FREE", "THYROIDAB" in the last 72 hours. Anemia Panel: No results for input(s): "VITAMINB12", "FOLATE", "FERRITIN", "TIBC", "IRON", "RETICCTPCT" in the last 72 hours. Sepsis Labs: No results for input(s): "PROCALCITON", "LATICACIDVEN" in the last 168 hours.  Recent Results (from the past 240 hour(s))  MRSA Next Gen by PCR, Nasal     Status: None   Collection Time: 03/11/22 12:41 PM   Specimen: Nasal Mucosa; Nasal Swab  Result Value Ref Range Status   MRSA by PCR Next Gen NOT DETECTED NOT DETECTED Final    Comment: (NOTE) The GeneXpert MRSA Assay (FDA approved for NASAL specimens only), is one component of a comprehensive MRSA colonization surveillance program. It is not intended to diagnose MRSA infection nor to guide or monitor treatment for MRSA infections. Test performance is not FDA approved in patients less than 46 years old. Performed at [facility], [address], [place], [place] [address]          Radiology Studies: MR BRAIN W WO CONTRAST  Result Date: 03/12/2022 CLINICAL DATA:  Follow-up examination status post craniotomy for tumor resection. EXAM: MRI HEAD WITHOUT AND WITH CONTRAST  TECHNIQUE: Multiplanar, multiecho pulse sequences of the brain and surrounding structures were obtained without and with intravenous contrast. CONTRAST:  19m GADAVIST GADOBUTROL 1 MMOL/ML IV SOLN COMPARISON:  Recent MRI from 03/09/2022. FINDINGS: Brain: Postoperative changes from interval right parietal craniotomy for tumor resection are seen. Postoperative blood products with a probable small focus of pneumocephalus present within the resection cavity. Previously seen enhancing mass has been resected. Few irregular foci of enhancement at the inferior aspect of the resection cavity favored to be postoperative and/or vascular in nature (series 18, image 100). There has been gross total resection of the tumor, with no  definite residual tumor seen on this immediate postoperative examination. Approximate 2 cm focus of restricted diffusion at the deep aspect of the resection cavity consistent with a small Peri resection infarct (series 6, image 29). Normal expected overlying postoperative dural thickening and enhancement at the right parietal convexity. Trace 2 mm subdural collection overlies the posterior right parietooccipital convexity without mass effect (series 15, image 32). Previously seen surrounding diffusion signal abnormality within the adjacent right parieto-occipital cortex has largely resolved. Again, this was likely related to superimposed changes of seizure on previous exam. There does remain residual mildly increased diffusion signal at the right thalamus (series 5, image 23). No associated enhancement. This remains indeterminate. Remainder the brain is otherwise unchanged in appearance with no other mass lesion or abnormal enhancement. No hydrocephalus or midline shift. Underlying atrophy with chronic microvascular ischemic disease again noted. No other mass lesion or abnormal enhancement elsewhere within the brain. Vascular: Major intracranial vascular flow voids are preserved. Skull and upper cervical spine: Craniocervical junction within normal limits. Bone marrow signal intensity normal. Post craniotomy changes noted at the right parietal calvarium without adverse features. Sinuses/Orbits: Globes and orbital soft tissues within normal limits. Acute right maxillary sinusitis noted. Mastoid air cells are clear. Other: None. IMPRESSION: 1. Postoperative changes from interval right parietal craniotomy for tumor resection. There has been gross total resection of the tumor, with no definite residual tumor seen on this immediate postoperative examination. 2. Approximate 2 cm focus of restricted diffusion at the deep aspect of the resection cavity, consistent with a small acute ischemic infarct. 3. Previously seen  surrounding diffusion signal abnormality within the adjacent right parieto-occipital cortex has resolved. Again, this was likely related to superimposed seizure on prior exam. 4. Persistent mild diffusion signal abnormality at the right thalamus, indeterminate, but similar to prior. Attention at follow-up recommended. 5. Acute right maxillary sinusitis. Electronically Signed   By: BJeannine BogaM.D.   On: 03/12/2022 04:30        Scheduled Meds:  Chlorhexidine Gluconate Cloth  6 each Topical Daily   dexamethasone  2 mg Oral Q12H   enoxaparin (LOVENOX) injection  40 mg Subcutaneous Q24H   feeding supplement  237 mL Oral TID BM   ferrous sulfate  325 mg Oral Q breakfast   haloperidol lactate  1 mg Intravenous Once   levETIRAcetam  500 mg Oral BID   levothyroxine  50 mcg Oral Daily   vitamin B-12  1,000 mcg Oral Daily   Continuous Infusions:     LOS: 4 days    SSidney Ace MD [facility]   If 7PM-7AM, please contact night-coverage  03/13/2022, 1:44 PM

## 2022-03-13 NOTE — Progress Notes (Signed)
    Attending Progress Note  History: Tabitha Ramirez is here for a brain tumor  POD2: NAEO  POD1: Doing well. Symptomatically improved.  Physical Exam: Vitals:   03/13/22 0800 03/13/22 0900  BP: 132/75   Pulse: 62 64  Resp: 15 13  Temp: 98.4 F (36.9 C)   SpO2: 99% 100%    AA Ox3 CNI VF appear improved  Strength:5/5 throughout BUE and BLE  Data:  Recent Labs  Lab 03/09/22 1537 03/10/22 0639  NA 141 139  K 4.1 3.7  CL 108 105  CO2 22 25  BUN 28* 29*  CREATININE 0.95 0.93  GLUCOSE 70 164*  CALCIUM 9.5 9.4    Recent Labs  Lab 03/09/22 1537  AST 33  ALT 27  ALKPHOS 84      Recent Labs  Lab 03/09/22 1537 03/10/22 0639  WBC 8.2 4.8  HGB 12.0 10.8*  HCT 36.4 32.0*  PLT 238 236    Recent Labs  Lab 03/09/22 1734 03/10/22 0639  APTT 27  --   INR 1.1 1.1          Other tests/results: MRI shows gross total resection  Assessment/Plan:  Tabitha Ramirez is doing well after brain tumor resection  - mobilize - pain control - DVT prophylaxis - PTOT - Wean dexamethasone - Neuro checks q4 hours; Ok to transfer out of ICU from neurosurgical standpoint  Cooper Render PA-C Department of Neurosurgery

## 2022-03-13 NOTE — Progress Notes (Addendum)
Inpatient Rehab Admissions:  Inpatient Rehab Consult received.  I spoke with pt's daughter [name] on the telephone (while she was in pt's room) for rehabilitation assessment and to discuss goals and expectations of an inpatient rehab admission.  She acknowledged understanding of CIR goals and expectations. She would like pt to pursue CIR. Pt was asleep. [name] confirmed that she and her husband will be able to provide 24/7 support for pt after discharge. Will continue to follow.  ADDENDUM 1552: Pt's daughter called AC when pt woke up. Explained CIR goals and expectations to pt. She acknowledged understanding. She is interested in pursuing CIR.   Signed: Gayland Curry, [place], [place] Admissions Coordinator [phone]

## 2022-03-13 NOTE — PMR Pre-admission (Shared)
PMR Admission Coordinator Pre-Admission Assessment  Patient: Tabitha Ramirez is an 76 y.o., female MRN: [id number] DOB: 1946/08/16 Height: '5\' 4"'$  (162.6 cm) Weight: 65.3 kg  Insurance Information HMO: ***    PPO: ***     PCP:      IPA:      80/20:      OTHER:  PRIMARY: [employer] CHO    Policy#: [id number]      Subscriber: patient CM Name: ***      Phone#: ***     Fax#: *** Pre-Cert#: ***      Employer: *** Benefits:  Phone #: ***     Name: *** Irene Shipper. Date: ***     Deduct: ***      Out of Pocket Max: ***      Life Max: *** CIR: ***      SNF: *** Outpatient: ***     Co-Pay: *** Home Health: ***      Co-Pay: *** DME: ***     Co-Pay: *** Providers: in-network SECONDARY:       Policy#:      Phone#:   Financial Counselor:       Phone#:   The Engineer, petroleum" for patients in Inpatient Rehabilitation Facilities with attached "Privacy Act [place] Records" was provided and verbally reviewed with: {CHL IP Patient Family WC:[id number]}  Emergency Contact Information Contact Information     Name Relation Home Work Mobile   Smith-Hylton,Michelle Daughter [phone]  [phone]   Hylton,Derrick Son   [phone]       Current Medical History  Patient Admitting Diagnosis: debility s/p right parietal craniotomy for tumor resection History of Present Illness: *** Complete NIHSS TOTAL: 1  Patient's medical record from [facility] has been reviewed by the rehabilitation admission coordinator and physician.  Past Medical History  Past Medical History:  Diagnosis Date   Allergy    Anemia    Arthritis    "joints" (09/29/2013)   Breast cancer ([place])    GERD (gastroesophageal reflux disease)    Heart murmur    History of blood transfusion    "related to chemo/breast cancer" (09/29/2013)   Hypertension    Migraines    Personal history of chemotherapy    Personal history of radiation therapy    Radiation 11/05/13-12/24/13   Right  breast    TMJ (temporomandibular joint syndrome)    "left; just dx'd" (09/29/2013    Has the patient had major surgery during 100 days prior to admission? Yes  Family History   family history includes Breast cancer in her maternal grandmother and mother; Cancer (age of onset: 52) in her maternal grandmother and mother; Colon cancer in her brother; Congestive Heart Failure in her brother and mother; Diabetes (age of onset: 33) in her brother; Heart disease in her brother.  Current Medications  Current Facility-Administered Medications:    acetaminophen (TYLENOL) tablet 1,000 mg, 1,000 mg, Oral, Q6H PRN, Meade Maw, MD, 1,000 mg at 03/12/22 0834   albuterol (PROVENTIL) (2.5 MG/3ML) 0.083% nebulizer solution 2.5 mg, 2.5 mg, Nebulization, Q2H PRN, Meade Maw, MD   Chlorhexidine Gluconate Cloth 2 % PADS 6 each, 6 each, Topical, Daily, Priscella Mann, Sudheer B, MD, 6 each at 03/13/22 1022   dexamethasone (DECADRON) tablet 2 mg, 2 mg, Oral, Q12H, Meade Maw, MD, 2 mg at 03/13/22 1022   enoxaparin (LOVENOX) injection 40 mg, 40 mg, Subcutaneous, Q24H, Meade Maw, MD, 40 mg at 03/12/22 2120   feeding supplement (ENSURE ENLIVE /  ENSURE PLUS) liquid 237 mL, 237 mL, Oral, TID BM, Meade Maw, MD, 237 mL at 03/13/22 1023   ferrous sulfate tablet 325 mg, 325 mg, Oral, Q breakfast, Meade Maw, MD, 325 mg at 03/13/22 1023   haloperidol lactate (HALDOL) injection 1 mg, 1 mg, Intravenous, Once, Meade Maw, MD   hydrALAZINE (APRESOLINE) injection 10 mg, 10 mg, Intravenous, Q4H PRN, Meade Maw, MD   HYDROcodone-acetaminophen (NORCO/VICODIN) 5-325 MG per tablet 1 tablet, 1 tablet, Oral, Q4H PRN, Meade Maw, MD   levETIRAcetam (KEPPRA) tablet 500 mg, 500 mg, Oral, BID, Meade Maw, MD, 500 mg at 03/13/22 1022   levothyroxine (SYNTHROID) tablet 50 mcg, 50 mcg, Oral, Daily, Meade Maw, MD, 50 mcg at 03/13/22 0604   LORazepam (ATIVAN)  injection 2 mg, 2 mg, Intravenous, Q4H PRN, Meade Maw, MD, 2 mg at 03/10/22 1507   ondansetron (ZOFRAN) tablet 4 mg, 4 mg, Oral, Q6H PRN **OR** ondansetron (ZOFRAN) injection 4 mg, 4 mg, Intravenous, Q6H PRN, Meade Maw, MD, 4 mg at 03/10/22 1505   Oral care mouth rinse, 15 mL, Mouth Rinse, PRN, Foust, Katy L, NP   oxyCODONE-acetaminophen (PERCOCET/ROXICET) 5-325 MG per tablet 1 tablet, 1 tablet, Oral, Q8H PRN, Meade Maw, MD, 1 tablet at 03/11/22 1747   vitamin B-12 (CYANOCOBALAMIN) tablet 1,000 mcg, 1,000 mcg, Oral, Daily, Meade Maw, MD, 1,000 mcg at 03/13/22 1022  Patients Current Diet:  Diet Order             DIET DYS 3 Room service appropriate? Yes; Fluid consistency: Thin  Diet effective now                   Precautions / Restrictions Precautions Precautions: Fall Precaution Comments: L field cut Restrictions Weight Bearing Restrictions: No   Has the patient had 2 or more falls or a fall with injury in the past year? No  Prior Activity Level Community (5-7x/wk): drives, takes care of grandson  Prior Functional Level Self Care: Did the patient need help bathing, dressing, using the toilet or eating? Independent  Indoor Mobility: Did the patient need assistance with walking from room to room (with or without device)? Independent  Stairs: Did the patient need assistance with internal or external stairs (with or without device)? Independent  Functional Cognition: Did the patient need help planning regular tasks such as shopping or remembering to take medications? Independent  Patient Information    Patient's Response To:     Home Assistive Devices / Equipment Home Assistive Devices/Equipment: None Home Equipment: Tub bench  Prior Device Use: Indicate devices/aids used by the patient prior to current illness, exacerbation or injury? None of the above  Current Functional Level Cognition  Overall Cognitive Status:  Impaired/Different from baseline Current Attention Level: Sustained Orientation Level: Oriented X4 Following Commands: Follows one step commands consistently, Follows one step commands with increased time Safety/Judgement: Decreased awareness of deficits, Decreased awareness of safety General Comments: Alert and oriented to basic information, follows commands; generally slowed and deliberate in response time; L field cut/inattention    Extremity Assessment (includes Sensation/Coordination)  Upper Extremity Assessment: LUE deficits/detail LUE Deficits / Details: ROM/strength WFL noted during functional assessment. LUE Sensation: decreased light touch, decreased proprioception LUE Coordination: decreased fine motor, decreased gross motor  Lower Extremity Assessment: Defer to PT evaluation LLE Deficits / Details: left knee extension 5/5, dorsiflexion 5/5. LLE Sensation: decreased light touch (absent to decreased proximally, decreased distally) LLE Coordination: decreased fine motor, decreased gross motor (heel to shin with mild dysmetria. decreased  coorindation and smoothness step length/foot placement while ambulating)    ADLs  Overall ADL's : Needs assistance/impaired Eating/Feeding: NPO Grooming: Wash/dry face, Sitting, Set up Grooming Details (indicate cue type and reason): able to attend to L side of face with intermittent vcs Lower Body Dressing: Maximal assistance Lower Body Dressing Details (indicate cue type and reason): socks Toilet Transfer: Moderate assistance, Stand-pivot, BSC/3in1 Toileting- Clothing Manipulation and Hygiene: Maximal assistance, Sit to/from stand Functional mobility during ADLs: Minimal assistance, Moderate assistance, +2 for physical assistance General ADL Comments: MOD +2 with vcs for sequencing and safe hand placment for BSC t/f. MOD A +2 with vcs to terminate task on the L side for clothing management in standing - needs assistance to pull underpants into  place. SUPERVISION/SETUP for washing face bed level.    Mobility  Overal bed mobility: Needs Assistance Bed Mobility: Supine to Sit Supine to sit: Min guard General bed mobility comments: transition towards L side of bed    Transfers  Overall transfer level: Needs assistance Equipment used: 2 person hand held assist Transfers: Sit to/from Stand Sit to Stand: Mod assist, +2 physical assistance General transfer comment: cues for foot placement, hand placement with hand over hand required for positioning for LLE and LUE. lifting assistance and facilitation required for anterior translation    Ambulation / Gait / Stairs / Wheelchair Mobility  Ambulation/Gait Ambulation/Gait assistance: Mod assist, +2 physical assistance Gait Distance (Feet): 40 Feet Assistive device: 2 person hand held assist Gait Pattern/deviations: Ataxic, Decreased stride length, Narrow base of support General Gait Details: inconsistent step length with LLE with ataxia noted. facilation for weight shifting to right for advancement of LLE. gait pattern worsens with fatigue despite cues. Gait velocity: decreased    Posture / Balance Balance Overall balance assessment: Needs assistance Sitting-balance support: No upper extremity supported, Feet supported Sitting balance-Leahy Scale: Fair Postural control: Posterior lean, Left lateral lean Standing balance support: Bilateral upper extremity supported Standing balance-Leahy Scale: Poor Standing balance comment: external support required for standing with mild left lean with static standing that is more pronounced with dynamic activity    Special needs/care consideration {Special Care Needs/Care Considerations:304600603}   Previous Home Environment (from acute therapy documentation) Living Arrangements: Alone (takes care of grandson at daughter's house) Available Help at Discharge: Family, Available 24 hours/day Type of Home: House Home Layout: One level Home Access:  Stairs to enter Entrance Stairs-Rails: None Entrance Stairs-Number of Steps: 2 Bathroom Shower/Tub: Optometrist: Yes How Accessible: Accessible via walker Home Care Services: No  Discharge Living Setting Plans for Discharge Living Setting: Patient's home (daughter and son-in-law will be staying with pt) Type of Home at Discharge: House Discharge Home Layout: One level Discharge Home Access: Stairs to enter Entrance Stairs-Rails: None Entrance Stairs-Number of Steps: 2 Discharge Bathroom Shower/Tub: Tub/shower unit Discharge Bathroom Toilet: Standard Discharge Bathroom Accessibility: Yes How Accessible: Accessible via walker Does the patient have any problems obtaining your medications?: No  Social/Family/Support Systems Patient Roles: Caregiver (takes care of grandson while parents are away) Anticipated Caregiver: Leonor Liv (daughter) and son-in-law Anticipated Caregiver's Contact Information: [name] Smith-Hylton:[phone] Caregiver Availability: 24/7 Discharge Plan Discussed with Primary Caregiver: Yes Is Caregiver In Agreement with Plan?: Yes Does Caregiver/Family have Issues with Lodging/Transportation while Pt is in Rehab?: No  Goals Patient/Family Goal for Rehab: *** Expected length of stay: *** Pt/Family Agrees to Admission and willing to participate: Yes Program Orientation Provided & Reviewed with Pt/Caregiver Including Roles  & Responsibilities: Yes  Decrease burden of Care through IP rehab admission: NA  Possible need for SNF placement upon discharge: Not anticipated  Patient Condition: I have reviewed medical records from [facility], spoken with CSW, and {CHL IP Patient Spouse Son Daughter Family Member:[id number]}. I {CHL IP at bedside or phone:304550005} for inpatient rehabilitation assessment.  Patient will benefit from ongoing PT, OT, and SLP, can actively participate in  3 hours of therapy a day 5 days of the week, and can make measurable gains during the admission.  Patient will also benefit from the coordinated team approach during an Inpatient Acute Rehabilitation admission.  The patient will receive intensive therapy as well as Rehabilitation physician, nursing, social worker, and care management interventions.  Due to {due MA:2633354} the patient requires 24 hour a day rehabilitation nursing.  The patient is currently *** with mobility and basic ADLs.  Discharge setting and therapy post discharge at home with home health is anticipated.  Patient has agreed to participate in the Acute Inpatient Rehabilitation Program and will admit {Time; today/tomorrow:10263}.  Preadmission Screen Completed By:  Bethel Born, 03/13/2022 3:49 PM ______________________________________________________________________   Discussed status with Dr. Marland Kitchen on *** at *** and received approval for admission today.  Admission Coordinator:  Bethel Born, CCC-SLP, time ***/Date ***   Assessment/Plan: Diagnosis: Does the need for close, 24 hr/day Medical supervision in concert with the patient's rehab needs make it unreasonable for this patient to be served in a less intensive setting? {yes_no_potentially:3041433} Co-Morbidities requiring supervision/potential complications: *** Due to {due TG:2563893}, does the patient require 24 hr/day rehab nursing? {yes_no_potentially:3041433} Does the patient require coordinated care of a physician, rehab nurse, PT, OT, and SLP to address physical and functional deficits in the context of the above medical diagnosis(es)? {yes_no_potentially:3041433} Addressing deficits in the following areas: {deficits:3041436} Can the patient actively participate in an intensive therapy program of at least 3 hrs of therapy 5 days a week? {yes_no_potentially:3041433} The potential for patient to make measurable gains while on inpatient rehab is  {potential:3041437} Anticipated functional outcomes upon discharge from inpatient rehab: {functional outcomes:304600100} PT, {functional outcomes:304600100} OT, {functional outcomes:304600100} SLP Estimated rehab length of stay to reach the above functional goals is: *** Anticipated discharge destination: {anticipated dc setting:21604} 10. Overall Rehab/Functional Prognosis: {potential:3041437}   MD Signature: ***

## 2022-03-13 NOTE — Evaluation (Signed)
Occupational Therapy Re-Evaluation Patient Details Name: Tabitha Ramirez MRN: [id number] DOB: [birth year] Today's Date: 03/13/2022   History of Present Illness Pt is a 76 year old female admitetd with likely seizure with newly diagnosed lesion  in the right parietal lobe after presenting to the ED with hand shaking, confusion, and disorientation. 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   Clinical Impression   Pt seen for OT/PT re-evaluation on this date. Upon arrival to room pt awake and alert laying upright in bed. Pt A&O x3 and reported no pain at this time. Pt required CGA for supine>sit and MOD +2 with vcs for sequencing and safe hand placement for BCS t/f. MOD A +2 with vcs to terminate task on the L side for clothing management in standing - needs assistance to pull underpants into place. SUPERVISION/SETUP for washing face bed level. Pt demonstrated compensatory strategies for L neglect with vcs. Pt required MOD/MAX multimodal for sequencing, initiation, and attending to tasks throughout session (see below for details). Pt making good progress toward goals. Pt continues to benefit from skilled OT services to maximize return to PLOF and minimize risk of future falls, injury. Will continue to follow POC. Discharge recommendation remains appropriate.       Recommendations for follow up therapy are one component of a multi-disciplinary discharge planning process, led by the attending physician.  Recommendations may be updated based on patient status, additional functional criteria and insurance authorization.   Follow Up Recommendations  Acute inpatient rehab (3hours/day)    Assistance Recommended at Discharge Frequent or constant Supervision/Assistance  Patient can return home with the  following A lot of help with walking and/or transfers;A lot of help with bathing/dressing/bathroom;Assistance with cooking/housework;Assistance with feeding;Assist for transportation;Direct supervision/assist for medications management    Functional Status Assessment  Patient has had a recent decline in their functional status and demonstrates the ability to make significant improvements in function in a reasonable and predictable amount of time.  Equipment Recommendations  Other (comment) (per next venue)    Recommendations for Other Services       Precautions / Restrictions Precautions Precautions: Fall Restrictions Weight Bearing Restrictions: No      Mobility Bed Mobility Overal bed mobility: Needs Assistance Bed Mobility: Supine to Sit     Supine to sit: Min guard          Transfers Overall transfer level: Needs assistance Equipment used: 2 person hand held assist Transfers: Sit to/from Stand Sit to Stand: Mod assist, +2 physical assistance                  Balance Overall balance assessment: Needs assistance Sitting-balance support: No upper extremity supported, Feet supported Sitting balance-Leahy Scale: Fair     Standing balance support: Bilateral upper extremity supported Standing balance-Leahy Scale: Poor                             ADL either performed or assessed with clinical judgement   ADL Overall ADL's : Needs assistance/impaired                                       General ADL Comments: MOD +2 with vcs for sequencing and safe  hand placment for BSC t/f. MOD A +2 with vcs to terminate task on the L side for clothing management in standing - needs assistance to pull underpants into place. SUPERVISION/SETUP for washing face bed level.     Vision Baseline Vision/History: 1 Wears glasses Patient Visual Report: No change from baseline Vision Assessment?: Vision impaired- to be further tested in functional  context;Yes Tracking/Visual Pursuits: Able to track stimulus in all quads without difficulty Visual Fields: Left visual field deficit Additional Comments: Pt displaying compensatory strategies with vcs for head turning.            Pertinent Vitals/Pain Pain Assessment Pain Assessment: No/denies pain     Hand Dominance Right   Extremity/Trunk Assessment Upper Extremity Assessment Upper Extremity Assessment: LUE deficits/detail LUE Deficits / Details: ROM/strength WFL noted during functional assessment. LUE Sensation: decreased light touch;decreased proprioception LUE Coordination: decreased fine motor;decreased gross motor   Lower Extremity Assessment Lower Extremity Assessment: Defer to PT evaluation LLE Deficits / Details: left knee extension 5/5, dorsiflexion 5/5. LLE Sensation: decreased light touch (absent to decreased proximally, decreased distally) LLE Coordination: decreased fine motor;decreased gross motor (heel to shin with mild dysmetria. decreased coorindation and smoothness step length/foot placement while ambulating)       Communication Communication Communication: No difficulties   Cognition Arousal/Alertness: Awake/alert Behavior During Therapy: Flat affect Overall Cognitive Status: Impaired/Different from baseline Area of Impairment: Orientation, Following commands, Safety/judgement, Problem solving                 Orientation Level: Disoriented to, Time     Following Commands: Follows one step commands consistently, Follows one step commands with increased time Safety/Judgement: Decreased awareness of deficits, Decreased awareness of safety   Problem Solving: Slow processing, Difficulty sequencing, Requires verbal cues, Requires tactile cues                  Home Living Family/patient expects to be discharged to:: Private residence                                        Prior Functioning/Environment Prior Level of  Function : Independent/Modified Independent             Mobility Comments: Ambulatory without assist device; assisted with care for 48 year old grandchild          OT Problem List: Decreased strength;Decreased range of motion;Decreased activity tolerance;Decreased coordination;Decreased safety awareness;Impaired balance (sitting and/or standing);Impaired sensation;Decreased cognition;Impaired vision/perception      OT Treatment/Interventions: Self-care/ADL training;Neuromuscular education;Therapeutic exercise;Visual/perceptual remediation/compensation;Balance training;Patient/family education;Therapeutic activities;DME and/or AE instruction    OT Goals(Current goals can be found in the care plan section) Acute Rehab OT Goals Patient Stated Goal: to return to PLOF OT Goal Formulation: With patient Time For Goal Achievement: 03/27/22 Potential to Achieve Goals: Fair  OT Frequency: Min 4X/week    Co-evaluation PT/OT/SLP Co-Evaluation/Treatment: Yes Reason for Co-Treatment: Complexity of the patient's impairments (multi-system involvement);To address functional/ADL transfers PT goals addressed during session: Mobility/safety with mobility OT goals addressed during session: ADL's and self-care      AM-PAC OT "6 Clicks" Daily Activity     Outcome Measure Help from another person eating meals?: A Little Help from another person taking care of personal grooming?: A Little Help from another person toileting, which includes using toliet, bedpan, or urinal?: A Lot Help from another person bathing (including washing, rinsing, drying)?: A Lot Help from  another person to put on and taking off regular upper body clothing?: A Lot Help from another person to put on and taking off regular lower body clothing?: A Lot 6 Click Score: 14   End of Session Equipment Utilized During Treatment: Gait belt  Activity Tolerance: Patient tolerated treatment well Patient left: in chair;with call  bell/phone within reach  OT Visit Diagnosis: Unsteadiness on feet (R26.81)                Time: 3567-0141 OT Time Calculation (min): 30 min Charges:  OT General Charges $OT Visit: 1 Visit OT Evaluation $OT Re-eval: 1 Re-eval  [employer], OTDS  [employer] 03/13/2022, 12:59 PM

## 2022-03-14 DIAGNOSIS — G9389 Other specified disorders of brain: Secondary | ICD-10-CM | POA: Diagnosis not present

## 2022-03-14 LAB — GLUCOSE, CAPILLARY: Glucose-Capillary: 142 mg/dL — ABNORMAL HIGH (ref 70–99)

## 2022-03-14 MED ORDER — POLYETHYLENE GLYCOL 3350 17 G PO PACK
17.0000 g | PACK | Freq: Every day | ORAL | Status: DC | PRN
  Administered 2022-03-15: 17 g via ORAL
  Filled 2022-03-14: qty 1

## 2022-03-14 MED ORDER — DEXAMETHASONE 0.5 MG PO TABS
1.0000 mg | ORAL_TABLET | Freq: Two times a day (BID) | ORAL | Status: AC
  Administered 2022-03-14 – 2022-03-15 (×4): 1 mg via ORAL
  Filled 2022-03-14 (×4): qty 2

## 2022-03-14 MED ORDER — SENNOSIDES-DOCUSATE SODIUM 8.6-50 MG PO TABS
1.0000 | ORAL_TABLET | Freq: Two times a day (BID) | ORAL | Status: DC
  Administered 2022-03-14 – 2022-03-17 (×6): 1 via ORAL
  Filled 2022-03-14 (×6): qty 1

## 2022-03-14 NOTE — Progress Notes (Signed)
    Attending Progress Note  History: Tabitha Ramirez is here for a brain tumor  POD3: Doing well, continues to improve.  POD2: NAEO  POD1: Doing well. Symptomatically improved.  Physical Exam: Vitals:   03/14/22 0000 03/14/22 0806  BP: 127/74   Pulse: 71   Resp: 19   Temp:  97.6 F (36.4 C)  SpO2: 100%     AA Ox3 CNI VF appear improved  Strength:5/5 throughout BUE and BLE  Incision c/d/I - dressing removed  Data:  Recent Labs  Lab 03/09/22 1537 03/10/22 0639  NA 141 139  K 4.1 3.7  CL 108 105  CO2 22 25  BUN 28* 29*  CREATININE 0.95 0.93  GLUCOSE 70 164*  CALCIUM 9.5 9.4   Recent Labs  Lab 03/09/22 1537  AST 33  ALT 27  ALKPHOS 84     Recent Labs  Lab 03/09/22 1537 03/10/22 0639  WBC 8.2 4.8  HGB 12.0 10.8*  HCT 36.4 32.0*  PLT 238 236   Recent Labs  Lab 03/09/22 1734 03/10/22 0639  APTT 27  --   INR 1.1 1.1         Other tests/results: MRI shows gross total resection  Assessment/Plan:  Tabitha Ramirez is doing well after brain tumor resection  - mobilize - pain control - DVT prophylaxis - PTOT - Wean dexamethasone - Neuro checks q4 hours; Ok to transfer out of ICU from neurosurgical standpoint - Inpatient rehab consult appreciated - Await pathology  Tabitha Maw MD Department of Neurosurgery

## 2022-03-14 NOTE — Progress Notes (Signed)
Inpatient Rehab Admissions Coordinator:   Following for my colleague, Gayland Curry.  Awaiting pathology results and treatment plan.  Will continue to follow.   Shann Medal, PT, DPT Admissions Coordinator [phone] 03/14/22  3:34 PM

## 2022-03-14 NOTE — Progress Notes (Signed)
PROGRESS NOTE    Tabitha Ramirez  IDP:[id number] DOB: [birth year] DOA: 03/09/2022 PCP: [facility]    Brief Narrative:   76 y.o. 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  Patient unable to provide history so majority of history gained from patient's son-in-law at bedside.   Patient last seen well at 2:45 PM on the day of admission.  Patient was dressing to pick up her grandchildren when she noted the hand shaking and could not get dressed.  Patient was found to be confused and presented to the emergency room.  Patient did present with left facial droop, left arm and leg drift, decree sensation with ataxia.   CT head showed right frontal parietal mass lesion likely metastases.  Neurology was called by EDP.  Code stroke was initially called.  Was canceled after CT findings were known.  Neurosurgery was contacted by EDP who recommended Decadron for vasogenic edema associated with presumptive cerebral metastasis.   Given the high likelihood of seizure as a result of the mass patient was loaded with Keppra 2000 mg in ED.  No further seizure activity noted  6/16: No clinical status changes overnight.  Slurred speech improving.  Patient remains somewhat confused.  Case discussed with neurosurgery Dr. Cari Caraway who discussed with primary oncologist.  Plan for craniotomy for tumor resection and tissue diagnosis.  Patient being consented for procedure today.  6/17: Patient seen and examined in ICU.  Discussed with neurosurgery.  Patient underwent right parietal craniectomy and resection of brain tumor this morning.  Tolerated procedure well  6/18: Patient doing well overall.  Symptomatically improving.  Okay for transfer to progressive unit    Assessment & Plan:   Principal  Problem:   Brain mass Active Problems:   Essential hypertension, benign   Anemia   Seizure (HCC)   Palliative care encounter  New brain mass with vasogenic edema Confusion Weakness This likely represents metastasis MRI brain with solitary mass consistent with CT CT chest abdomen pelvis with no evidence of metastatic disease Oncology, neurology, neurosurgery engaged Status post craniotomy and tumor resection 6/17 Plan: Transfer to floor Mobilize as tolerated PT OT ST consults SQ Lovenox for VTE prophylaxis As needed pain control Frequent neurochecks Oncology/rad onc followup Wean Decadron CIR consult    Likely new onset seizure Likely secondary to brain mass Seen by neurology in ED Status post Keppra 2000 mg load Plan: Continue Keppra 500 mg PO BID Decadron as above, 1 mg every 12 hours.  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  Status post inpatient craniotomy.  Pending path  Level of care: Progressive  Consultants:  Neurosurgery Neurology Oncology Palliative care  Procedures:  Craniotomy 6/17  Antimicrobials: None   Subjective: Seen and examined.  More awake this morning.  Answers questions appropriately.  No distress  Objective: Vitals:   03/14/22 0600 03/14/22 0700  03/14/22 0800 03/14/22 0806  BP: 128/80  (!) 152/83   Pulse: 70 (!) 59 66   Resp: '19 12 16   '$ Temp:     97.6 F (36.4 C)  TempSrc:    Oral  SpO2: 100% 100% 100%   Weight:      Height:        Intake/Output Summary (Last 24 hours) at 03/14/2022 1139 Last data filed at 03/14/2022 0600 Gross per 24 hour  Intake --  Output 800 ml  Net -800 ml   Filed Weights   03/10/22 0000  Weight: 65.3 kg    Examination:  General exam: No acute distress Respiratory system: Lungs clear.  Normal work of breathing.  Room air Cardiovascular system: S1-S2, RRR, no murmurs, no pedal edema Gastrointestinal system: Thin, soft, NT/ND, normal bowel sounds Central nervous system: Alert and oriented.  Left-sided weakness Extremities: Symmetric 5 x 5 power.  Left side decreased power Skin: No rashes, lesions or ulcers Psychiatry: Flattened affect    Data Reviewed: I have personally reviewed following labs and imaging studies  CBC: Recent Labs  Lab 03/09/22 1537 03/10/22 0639  WBC 8.2 4.8  HGB 12.0 10.8*  HCT 36.4 32.0*  MCV 101.7* 98.2  PLT 238 948   Basic Metabolic Panel: Recent Labs  Lab 03/09/22 1537 03/10/22 0639  NA 141 139  K 4.1 3.7  CL 108 105  CO2 22 25  GLUCOSE 70 164*  BUN 28* 29*  CREATININE 0.95 0.93  CALCIUM 9.5 9.4   GFR: Estimated Creatinine Clearance: 45.8 mL/min (by C-G formula based on SCr of 0.93 mg/dL). Liver Function Tests: Recent Labs  Lab 03/09/22 1537  AST 33  ALT 27  ALKPHOS 84  BILITOT 1.2  PROT 8.3*  ALBUMIN 4.1   No results for input(s): "LIPASE", "AMYLASE" in the last 168 hours. No results for input(s): "AMMONIA" in the last 168 hours. Coagulation Profile: Recent Labs  Lab 03/09/22 1734 03/10/22 0639  INR 1.1 1.1   Cardiac Enzymes: Recent Labs  Lab 03/10/22 0639  CKTOTAL 116   BNP (last 3 results) No results for input(s): "PROBNP" in the last 8760 hours. HbA1C: No results for input(s): "HGBA1C" in the last 72 hours. CBG: Recent Labs  Lab 03/11/22 1239 03/12/22 0838 03/13/22 0730 03/14/22 0736  GLUCAP 164* 127* 137* 142*    Lipid Profile: No results for input(s): "CHOL", "HDL", "LDLCALC", "TRIG", "CHOLHDL", "LDLDIRECT" in the last 72 hours. Thyroid Function Tests: No results for input(s): "TSH", "T4TOTAL", "FREET4", "T3FREE", "THYROIDAB" in the last 72 hours. Anemia Panel: No results for input(s): "VITAMINB12", "FOLATE", "FERRITIN", "TIBC", "IRON", "RETICCTPCT" in the last 72 hours. Sepsis Labs: No results for input(s): "PROCALCITON", "LATICACIDVEN" in the last 168 hours.  Recent Results (from the past 240 hour(s))  MRSA Next Gen by PCR, Nasal     Status: None   Collection Time: 03/11/22 12:41 PM   Specimen: Nasal Mucosa; Nasal Swab  Result Value Ref Range Status   MRSA by PCR Next Gen NOT DETECTED NOT DETECTED Final    Comment: (NOTE) The GeneXpert MRSA Assay (FDA approved for NASAL specimens only), is one component of a comprehensive MRSA colonization surveillance program. It is not intended to diagnose MRSA infection nor to guide or monitor treatment for MRSA infections. Test performance is not FDA approved in patients less than 46 years old. Performed at [facility], [address], [place], [place] [address]          Radiology Studies: No results found.  Scheduled Meds:  Chlorhexidine Gluconate Cloth  6 each Topical Daily   dexamethasone  1 mg Oral Q12H   enoxaparin (LOVENOX) injection  40 mg Subcutaneous Q24H   feeding supplement  237 mL Oral TID BM   ferrous sulfate  325 mg Oral Q breakfast   haloperidol lactate  1 mg Intravenous Once   levETIRAcetam  500 mg Oral BID   levothyroxine  50 mcg Oral Daily   vitamin B-12  1,000 mcg Oral Daily   Continuous Infusions:     LOS: 5 days    Sidney Ace, MD [facility]   If 7PM-7AM, please contact night-coverage  03/14/2022, 11:39 AM

## 2022-03-14 NOTE — Progress Notes (Signed)
Occupational Therapy Treatment Patient Details Name: Tabitha Ramirez MRN: [id number] DOB: [birth year] Today's Date: 03/14/2022   History of present illness Pt is a 76 year old female admitetd with likely seizure with newly diagnosed lesion  in the right parietal lobe after presenting to the ED with hand shaking, confusion, and disorientation. 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   OT comments  Pt seen for OT treatment on this date. Upon arrival to room pt awake and alert seated upright in recliner. Pt A&O x 3 and reporting no pain. Pt agreeable to tx focusing on functional mobility, ADL t/f, and visual scanning grooming tasks. Pt required MOD A +2 with vcs for safe hand placement for STS. CGA/MIN A for balance, with no AD and vcs for problem solving for grooming tasks in standing ~10 minutes - pt demonstrating visual scanning to the L side and incorporating LUE in BUE tasks. MIN/MOD A +2 with vcs for sequencing and safe hand placement for ADL t/f. SUPERVISION/SETUP for pericare while seated and MAX A for lower body dressing. MOD A for donning/doffing hospital gown. Pt making good progress toward goals. Pt continues to benefit from skilled OT services to maximize return to PLOF and minimize risk of future falls, injury. Will continue to follow POC. Discharge recommendation remains appropriate.     Recommendations for follow up therapy are one component of a multi-disciplinary discharge planning process, led by the attending physician.  Recommendations may be updated based on patient status, additional functional criteria and insurance authorization.    Follow Up Recommendations  Acute inpatient rehab (3hours/day)    Assistance Recommended at Discharge Frequent or constant Supervision/Assistance   Patient can return home with the following  A lot of help with walking and/or transfers;A lot of help with bathing/dressing/bathroom;Assistance with cooking/housework;Assistance with feeding;Assist for transportation;Direct supervision/assist for medications management   Equipment Recommendations  Other (comment) (per next venue)    Recommendations for Other Services      Precautions / Restrictions Precautions Precautions: Fall Restrictions Weight Bearing Restrictions: No       Mobility Bed Mobility               General bed mobility comments: not tested, pt in recliner at start and end of session    Transfers Overall transfer level: Needs assistance Equipment used: 2 person hand held assist Transfers: Sit to/from Stand Sit to Stand: Mod assist, +2 physical assistance           General transfer comment: vcs for safe hand placement     Balance Overall balance assessment: Needs assistance Sitting-balance support: No upper extremity supported, Feet supported Sitting balance-Leahy Scale: Fair     Standing balance support: Bilateral upper extremity supported, During functional activity Standing balance-Leahy Scale: Fair                             ADL either performed or assessed with clinical judgement   ADL Overall ADL's : Needs assistance/impaired                                       General ADL Comments: CGA, with no AD and vcs for problem solving for grooming tasks in standing ~10  minutes - pt demonstrating visual scanning to the L side and incorporating LUE in BUE tasks. MIN/MOD A +2 with vcs for sequencing and safe hand placement for ADL t/f. SUPERVISION/SETUP for pericare while seated and MAX A for lower body dressing. MOD A for donning/doffing hospital gown.      Cognition Arousal/Alertness: Awake/alert Behavior During Therapy: WFL for tasks assessed/performed Overall Cognitive Status: No family/caregiver present to  determine baseline cognitive functioning                         Following Commands: Follows one step commands with increased time, Follows one step commands consistently Safety/Judgement: Decreased awareness of safety, Decreased awareness of deficits   Problem Solving: Slow processing, Requires verbal cues, Requires tactile cues, Difficulty sequencing                     Pertinent Vitals/ Pain       Pain Assessment Pain Assessment: No/denies pain   Frequency  Min 4X/week        Progress Toward Goals  OT Goals(current goals can now be found in the care plan section)  Progress towards OT goals: Progressing toward goals  Acute Rehab OT Goals Patient Stated Goal: building strength in LUE OT Goal Formulation: With patient Time For Goal Achievement: 03/27/22 Potential to Achieve Goals: Good ADL Goals Pt Will Perform Grooming: standing;with min assist Pt Will Perform Upper Body Dressing: with min assist;sitting Pt Will Perform Lower Body Dressing: with min assist;sit to/from stand Pt Will Transfer to Toilet: with min assist;ambulating Pt Will Perform Toileting - Clothing Manipulation and hygiene: with min assist;sit to/from stand  Plan Discharge plan remains appropriate;Frequency remains appropriate       AM-PAC OT "6 Clicks" Daily Activity     Outcome Measure   Help from another person eating meals?: A Little Help from another person taking care of personal grooming?: A Little Help from another person toileting, which includes using toliet, bedpan, or urinal?: A Lot Help from another person bathing (including washing, rinsing, drying)?: A Lot Help from another person to put on and taking off regular upper body clothing?: A Little Help from another person to put on and taking off regular lower body clothing?: A Lot 6 Click Score: 15    End of Session Equipment Utilized During Treatment: Gait belt  OT Visit Diagnosis: Unsteadiness on feet (R26.81)    Activity Tolerance Patient tolerated treatment well   Patient Left in chair;with call bell/phone within reach   Nurse Communication Other (comment) (purewick was taken off)        Time: 1425-1506 OT Time Calculation (min): 41 min  Charges: OT General Charges $OT Visit: 1 Visit OT Treatments $Self Care/Home Management : 23-37 mins $Neuromuscular Re-education: 8-22 mins  [employer], OTDS  [employer] 03/14/2022, 4:02 PM

## 2022-03-14 NOTE — Progress Notes (Signed)
SLP Cancellation Note  Patient Details Name: Tabitha Ramirez MRN: [id number] DOB: 09-11-1946   Cancelled treatment:       Reason Eval/Treat Not Completed:  (chart reviewed; consulted NSG then met w/ pt/Dtr in room. Pt is doing well since her surgery; A/O and performing ADLs. She denied any issues w/ swallowing; Dtr agreed. Pt would like to upgrade back to regular diet; this is appropriate for her to do.) Diet upgraded to Regular w/ thins; recommended continued general aspiration precautions. Pt/Dtr and NSG agreed.     Orinda Kenner, MS, CCC-SLP Speech Language Pathologist Rehab Services; [place] [phone] (ascom) Nikira Kushnir 03/14/2022, 5:24 PM

## 2022-03-14 NOTE — Progress Notes (Signed)
Physical Therapy Treatment Patient Details Name: Tabitha Ramirez MRN: [id number] DOB: [birth year] Today's Date: 03/14/2022   History of Present Illness Pt is a 76 year old female admitetd with likely seizure with newly diagnosed lesion  in the right parietal lobe after presenting to the ED with hand shaking, confusion, and disorientation. 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    PT Comments    Patient is making progress towards functional independence with mobility. She can perform transfers and ambulation with moderate assistance of one person. Gait continues to be ataxic with varying step length/height of LLE with foot placement and facilitation needed for weight shifting to right side. And safety cues required for left side awareness and to avoid walking into objects on the left. The patient is very motivated with a supportive family. Continue to recommend inpatient rehab at discharge.    Recommendations for follow up therapy are one component of a multi-disciplinary discharge planning process, led by the attending physician.  Recommendations may be updated based on patient status, additional functional criteria and insurance authorization.  Follow Up Recommendations  Acute inpatient rehab (3hours/day)     Assistance Recommended at Discharge Frequent or constant Supervision/Assistance  Patient can return home with the following A lot of help with walking and/or transfers;A lot of help with bathing/dressing/bathroom   Equipment Recommendations   (to be determined at next level of care)    Recommendations for Other Services       Precautions / Restrictions Precautions Precautions: Fall Restrictions Weight Bearing Restrictions: No     Mobility  Bed Mobility                General bed mobility comments: not assessed as patient sitting up on arrival and post session    Transfers Overall transfer level: Needs assistance Equipment used: 1 person hand held assist Transfers: Sit to/from Stand Sit to Stand: Mod assist           General transfer comment: lifting assistance required for standing. she needs hand over hand cues for LLE positioning with continued decreased coordination    Ambulation/Gait Ambulation/Gait assistance: Mod assist Gait Distance (Feet): 40 Feet Assistive device: Rolling walker (2 wheels) Gait Pattern/deviations: Ataxic, Decreased stride length, Narrow base of support Gait velocity: decreased   Pre-gait activities: faciliation for weight shifting to right prior to standing General Gait Details: patient continues to have inconsistent step height with LLE and required cues for foot placement. she required faciliation for weight shifting to the right prior to left swing phase. moderate cues for left side awareness as patient bumping into objects on the left side with the walker   Stairs             Wheelchair Mobility    Modified Rankin (Stroke Patients Only)       Balance Overall balance assessment: Needs assistance Sitting-balance support: No upper extremity supported, Feet supported Sitting balance-Leahy Scale: Fair     Standing balance support: Bilateral upper extremity supported Standing balance-Leahy Scale: Poor Standing balance comment: external support required to maintain standing balance                            Cognition Arousal/Alertness: Awake/alert Behavior During Therapy: WFL for tasks assessed/performed Overall Cognitive Status: No family/caregiver present to determine baseline cognitive  functioning                         Following Commands: Follows one step commands consistently, Follows one step commands with increased time Safety/Judgement: Decreased awareness of  deficits, Decreased awareness of safety              Exercises      General Comments        Pertinent Vitals/Pain Pain Assessment Pain Assessment: No/denies pain    Home Living                          Prior Function            PT Goals (current goals can now be found in the care plan section) Acute Rehab PT Goals Patient Stated Goal: to regain independence and return home PT Goal Formulation: With patient Time For Goal Achievement: 03/27/22 Potential to Achieve Goals: Good Progress towards PT goals: Progressing toward goals    Frequency    7X/week      PT Plan Current plan remains appropriate    Co-evaluation              AM-PAC PT "6 Clicks" Mobility   Outcome Measure  Help needed turning from your back to your side while in a flat bed without using bedrails?: A Little Help needed moving from lying on your back to sitting on the side of a flat bed without using bedrails?: A Little Help needed moving to and from a bed to a chair (including a wheelchair)?: A Lot Help needed standing up from a chair using your arms (e.g., wheelchair or bedside chair)?: A Lot Help needed to walk in hospital room?: A Lot Help needed climbing 3-5 steps with a railing? : Total 6 Click Score: 13    End of Session Equipment Utilized During Treatment: Gait belt Activity Tolerance: Patient tolerated treatment well Patient left: in chair;with call bell/phone within reach Nurse Communication: Mobility status PT Visit Diagnosis: Muscle weakness (generalized) (M62.81);Difficulty in walking, not elsewhere classified (R26.2);Hemiplegia and hemiparesis Hemiplegia - Right/Left: Left Hemiplegia - dominant/non-dominant: Non-dominant Hemiplegia - caused by: Other cerebrovascular disease     Time: 1205-1238 PT Time Calculation (min) (ACUTE ONLY): 33 min  Charges:  $Gait Training: 8-22 mins $Therapeutic Activity: 8-22 mins                     Minna Merritts, PT,  MPT   Percell Locus 03/14/2022, 2:50 PM

## 2022-03-14 NOTE — Progress Notes (Signed)
Called unit at 1345 and requested report so patient can be transferred to assigned unit/room.

## 2022-03-15 DIAGNOSIS — G9389 Other specified disorders of brain: Secondary | ICD-10-CM | POA: Diagnosis not present

## 2022-03-15 MED ORDER — BISACODYL 10 MG RE SUPP: 10.0000 mg | Freq: Every day | RECTAL | Status: DC | PRN

## 2022-03-15 MED ORDER — ROSUVASTATIN CALCIUM 10 MG PO TABS
10.0000 mg | ORAL_TABLET | Freq: Every day | ORAL | Status: DC
  Administered 2022-03-16 – 2022-03-17 (×2): 10 mg via ORAL
  Filled 2022-03-15 (×2): qty 1

## 2022-03-15 NOTE — Discharge Instructions (Signed)
NEUROSURGERY DISCHARGE INSTRUCTIONS  Admission diagnosis: Brain mass [G93.89]  Operative procedure: craniotomy for tumor resection  What to do after you leave the hospital:  Special Instructions  No straining, no heavy lifting > 10lbs x 4 weeks.  Keep incision area clean and dry. May shower in 2 days. No baths or pools for 6 weeks.    You have sutures or staples that will be removed in clinic.  Please take pain medications as directed. Take a stool softener if on pain medications   Please Report any of the following: Nausea or Vomiting, Temperature is greater than 101.82F (38.1C) degrees, Dizziness, Abdominal Pain, Difficulty Breathing or Shortness of Breath, Inability to Eat, drink Fluids, or Take medications, Bleeding, swelling, or drainage from surgical incision sites, New numbness or weakness, and Bowel or bladder dysfunction to the neurosurgeon on call at [phone]  Additional Follow up appointments Please follow up with Cooper Render PA-C in [place] clinic as scheduled in 2-3 weeks   Please see below for scheduled appointments:  Future Appointments  Date Time Provider [place]  03/22/2022  2:30 PM [facility] None  03/22/2022  3:00 PM Nicholas Lose, MD [facility] None  08/02/2022 10:30 AM Nicholas Lose, MD [facility] None

## 2022-03-15 NOTE — Care Management Important Message (Signed)
Important Message  Patient Details  Name: Tabitha Ramirez MRN: [id number] Date of Birth: Aug 14, 1946   Medicare Important Message Given:  Yes     Juliann Pulse A Aerith Canal 03/15/2022, 2:52 PM

## 2022-03-15 NOTE — Progress Notes (Signed)
PROGRESS NOTE    Tabitha Ramirez  MVH:[id number] DOB: 28-Aug-1946 DOA: 03/09/2022 PCP: [facility]    Brief Narrative:   76 y.o. 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  Patient unable to provide history so majority of history gained from patient's son-in-law at bedside.   Patient last seen well at 2:45 PM on the day of admission.  Patient was dressing to pick up her grandchildren when she noted the hand shaking and could not get dressed.  Patient was found to be confused and presented to the emergency room.  Patient did present with left facial droop, left arm and leg drift, decree sensation with ataxia.   CT head showed right frontal parietal mass lesion likely metastases.  Neurology was called by EDP.  Code stroke was initially called.  Was canceled after CT findings were known.  Neurosurgery was contacted by EDP who recommended Decadron for vasogenic edema associated with presumptive cerebral metastasis.   Given the high likelihood of seizure as a result of the mass patient was loaded with Keppra 2000 mg in ED.  No further seizure activity noted  6/16: No clinical status changes overnight.  Slurred speech improving.  Patient remains somewhat confused.  Case discussed with neurosurgery Dr. Cari Caraway who discussed with primary oncologist.  Plan for craniotomy for tumor resection and tissue diagnosis.  Patient being consented for procedure today.  6/17: Patient seen and examined in ICU.  Discussed with neurosurgery.  Patient underwent right parietal craniectomy and resection of brain tumor this morning.  Tolerated procedure well  6/18: Patient doing well overall.  Symptomatically improving.  Okay for transfer to progressive unit    Assessment & Plan:   Principal  Problem:   Brain mass Active Problems:   Essential hypertension, benign   Anemia   Seizure (HCC)   Palliative care encounter  New brain mass with vasogenic edema Confusion Weakness This likely represents metastasis MRI brain with solitary mass consistent with CT CT chest abdomen pelvis with no evidence of metastatic disease Oncology, neurology, neurosurgery engaged Status post craniotomy and tumor resection 6/17 Plan: PT/OT following, awaiting insurance auth for CIR As needed pain control Frequent neurochecks Oncology/rad onc followup Wean Decadron Dr. 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 Lindi Adie outpt   Essential hypertension Bp wnl. Hold antihypertensives for now As needed hydralazine Vitals per unit protocol   Hypothyroidism PTA Synthroid   Iron deficiency anemia B12 deficiency PTA ferrous sulfate and B12  DVT prophylaxis: SQ Lovenox Code Status: Full Family Communication: Daughter at bedside 6/21 Disposition Plan: Status is: Inpatient Remains inpatient appropriate because: pending CIR placement  Level of care: Med-Surg  Consultants:  Neurosurgery Neurology Oncology Palliative care  Procedures:  Craniotomy 6/17  Antimicrobials: None   Subjective: Seen and examined.  No complaints.  Objective: Vitals:   03/14/22 1702 03/14/22 2023 03/15/22 0656 03/15/22 0912  BP: 125/72 123/66 134/65 123/90  Pulse: 72 74 63 72  Resp: '16 18 18 16  '$ Temp: 98.2 F (36.8 C) 98.4 F (36.9 C) 98.1 F (36.7 C) 98.2 F (  36.8 C)  TempSrc:  Oral Oral   SpO2: 100% 100% 100% 100%  Weight:      Height:        Intake/Output Summary (Last 24 hours) at 03/15/2022 1528 Last data  filed at 03/15/2022 0900 Gross per 24 hour  Intake 580 ml  Output 650 ml  Net -70 ml   Filed Weights   03/10/22 0000  Weight: 65.3 kg    Examination:  General exam: No acute distress Respiratory system: Lungs clear.  Normal work of breathing.  Room air Cardiovascular system: S1-S2, RRR, no murmurs, no pedal edema Gastrointestinal system: Thin, soft, NT/ND, normal bowel sounds Central nervous system: Alert and oriented.  Left-sided weakness Extremities: Symmetric 5 x 5 power.  Left side decreased power Skin: healing scalp surgical incision Psychiatry: Flattened affect    Data Reviewed: I have personally reviewed following labs and imaging studies  CBC: Recent Labs  Lab 03/09/22 1537 03/10/22 0639  WBC 8.2 4.8  HGB 12.0 10.8*  HCT 36.4 32.0*  MCV 101.7* 98.2  PLT 238 962   Basic Metabolic Panel: Recent Labs  Lab 03/09/22 1537 03/10/22 0639  NA 141 139  K 4.1 3.7  CL 108 105  CO2 22 25  GLUCOSE 70 164*  BUN 28* 29*  CREATININE 0.95 0.93  CALCIUM 9.5 9.4   GFR: Estimated Creatinine Clearance: 45.8 mL/min (by C-G formula based on SCr of 0.93 mg/dL). Liver Function Tests: Recent Labs  Lab 03/09/22 1537  AST 33  ALT 27  ALKPHOS 84  BILITOT 1.2  PROT 8.3*  ALBUMIN 4.1   No results for input(s): "LIPASE", "AMYLASE" in the last 168 hours. No results for input(s): "AMMONIA" in the last 168 hours. Coagulation Profile: Recent Labs  Lab 03/09/22 1734 03/10/22 0639  INR 1.1 1.1   Cardiac Enzymes: Recent Labs  Lab 03/10/22 0639  CKTOTAL 116   BNP (last 3 results) No results for input(s): "PROBNP" in the last 8760 hours. HbA1C: No results for input(s): "HGBA1C" in the last 72 hours. CBG: Recent Labs  Lab 03/11/22 1239 03/12/22 0838 03/13/22 0730 03/14/22 0736  GLUCAP 164* 127* 137* 142*   Lipid Profile: No results for input(s): "CHOL", "HDL", "LDLCALC", "TRIG", "CHOLHDL", "LDLDIRECT" in the last 72 hours. Thyroid Function Tests: No results  for input(s): "TSH", "T4TOTAL", "FREET4", "T3FREE", "THYROIDAB" in the last 72 hours. Anemia Panel: No results for input(s): "VITAMINB12", "FOLATE", "FERRITIN", "TIBC", "IRON", "RETICCTPCT" in the last 72 hours. Sepsis Labs: No results for input(s): "PROCALCITON", "LATICACIDVEN" in the last 168 hours.  Recent Results (from the past 240 hour(s))  MRSA Next Gen by PCR, Nasal     Status: None   Collection Time: 03/11/22 12:41 PM   Specimen: Nasal Mucosa; Nasal Swab  Result Value Ref Range Status   MRSA by PCR Next Gen NOT DETECTED NOT DETECTED Final    Comment: (NOTE) The GeneXpert MRSA Assay (FDA approved for NASAL specimens only), is one component of a comprehensive MRSA colonization surveillance program. It is not intended to diagnose MRSA infection nor to guide or monitor treatment for MRSA infections. Test performance is not FDA approved in patients less than 38 years old. Performed at [facility], [address] [place], [place] [address]          Radiology Studies: No results found.      Scheduled Meds:  Chlorhexidine Gluconate Cloth  6 each Topical Daily   dexamethasone  1 mg Oral Q12H   enoxaparin (LOVENOX) injection  40  mg Subcutaneous Q24H   feeding supplement  237 mL Oral TID BM   ferrous sulfate  325 mg Oral Q breakfast   haloperidol lactate  1 mg Intravenous Once   levETIRAcetam  500 mg Oral BID   levothyroxine  50 mcg Oral Daily   [START ON 03/16/2022] rosuvastatin  10 mg Oral Daily   senna-docusate  1 tablet Oral BID   vitamin B-12  1,000 mcg Oral Daily   Continuous Infusions:     LOS: 6 days    Desma Maxim, MD [facility]   If 7PM-7AM, please contact night-coverage  03/15/2022, 3:28 PM

## 2022-03-15 NOTE — Progress Notes (Signed)
Inpatient Rehab Admissions Coordinator:   Spoke to pt's daughter over the phone to provide update.  Per Dr. Cari Caraway and Dr. Si Raider pathology results likely to take some time and no treatment until at least 3 weeks out from surgery.  Acute medical workup complete and this time and ready to discharge to next LOC.  I will start insurance auth request this morning, and will follow for determination.   Shann Medal, PT, DPT Admissions Coordinator [phone] 03/15/22  9:58 AM

## 2022-03-15 NOTE — TOC Progression Note (Signed)
Transition of Care [facility]) - Progression Note    Patient Details  Name: Tabitha Ramirez MRN: [id number] Date of Birth: 1946/02/07  Transition of [facility]) CM/SW Contact  Eileen Stanford, LCSW Phone Number: 03/15/2022, 1:58 PM  Clinical Narrative:   [name] pending for CIR         Expected Discharge Plan and Services                                                 Social Determinants of Health (SDOH) Interventions    Readmission Risk Interventions     No data to display

## 2022-03-15 NOTE — Plan of Care (Signed)

## 2022-03-15 NOTE — Progress Notes (Signed)
    Attending Progress Note  History: Tabitha Ramirez is here for a brain tumor  POD4: denies headache  POD3: Doing well, continues to improve.  POD2: NAEO  POD1: Doing well. Symptomatically improved.  Physical Exam: Vitals:   03/14/22 2023 03/15/22 0656  BP: 123/66 134/65  Pulse: 74 63  Resp: 18 18  Temp: 98.4 F (36.9 C) 98.1 F (36.7 C)  SpO2: 100% 100%    AA Ox3  CNI  Strength:5/5 throughout BUE and BLE  Incision c/d/I - dressing removed  Data:  Recent Labs  Lab 03/09/22 1537 03/10/22 0639  NA 141 139  K 4.1 3.7  CL 108 105  CO2 22 25  BUN 28* 29*  CREATININE 0.95 0.93  GLUCOSE 70 164*  CALCIUM 9.5 9.4    Recent Labs  Lab 03/09/22 1537  AST 33  ALT 27  ALKPHOS 84      Recent Labs  Lab 03/09/22 1537 03/10/22 0639  WBC 8.2 4.8  HGB 12.0 10.8*  HCT 36.4 32.0*  PLT 238 236    Recent Labs  Lab 03/09/22 1734 03/10/22 0639  APTT 27  --   INR 1.1 1.1          Other tests/results: MRI shows gross total resection  Assessment/Plan:  Tabitha Ramirez is doing well after brain tumor resection  - mobilize - pain control - DVT prophylaxis - PTOT - Wean dexamethasone - Neuro checks q4 hours; Pt transferred to floor - Inpatient rehab consult appreciated; awaiting placement - Await pathology  Cooper Render PA-C Department of Neurosurgery

## 2022-03-15 NOTE — Progress Notes (Signed)
Physical Therapy Treatment Patient Details Name: Tabitha Ramirez MRN: [id number] DOB: 1946/04/04 Today's Date: 03/15/2022   History of Present Illness Pt is a 76 year old female admitetd with likely seizure with newly diagnosed lesion  in the right parietal lobe after presenting to the ED with hand shaking, confusion, and disorientation. 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    PT Comments    Pt was long sitting in bed, alert and oriented, and agreeable to session. She states she is doing better than previous date. Easily able to exit bed, stand and ambulate with AD however once pt performed activity without AD, she quickly becomes unsteady/unsafe. Highly recommend pt use RW with RN staff until balance and coordination improves. Pt is not at her baseline physically and will benefit from CIR/ continued PT to address deficits while maximizing independence with all ADLs.    Recommendations for follow up therapy are one component of a multi-disciplinary discharge planning process, led by the attending physician.  Recommendations may be updated based on patient status, additional functional criteria and insurance authorization.  Follow Up Recommendations  Acute inpatient rehab (3hours/day) (pt is still not at baseline abilities/safety with ADLs)     Assistance Recommended at Discharge Frequent or constant Supervision/Assistance  Patient can return home with the following A little help with walking and/or transfers;A little help with bathing/dressing/bathroom;Assistance with cooking/housework;Direct supervision/assist for medications management;Direct supervision/assist for financial management;Assist for transportation;Help with stairs or ramp for entrance   Equipment Recommendations  Other  (comment) (defer to next level of care)       Precautions / Restrictions Precautions Precautions: Fall Precaution Comments: L field cut Restrictions Weight Bearing Restrictions: No     Mobility  Bed Mobility Overal bed mobility: Needs Assistance Bed Mobility: Supine to Sit  Supine to sit: Supervision, HOB elevated  General bed mobility comments: no physical assistance required to exit L side of bed    Transfers Overall transfer level: Needs assistance Equipment used: Rolling walker (2 wheels) Transfers: Sit to/from Stand Sit to Stand: Min guard  General transfer comment: CGA for safety to stand from EOB    Ambulation/Gait Ambulation/Gait assistance: Min guard Gait Distance (Feet): 20 Feet Assistive device: Rolling walker (2 wheels) Gait Pattern/deviations: Step-through pattern, Trunk flexed Gait velocity: decreased     General Gait Details: Pt was able to safely and easily ambulate 200 ft with RW however when AD removed, pt has severe unsteadiness. no ataxic movements noted this session however per chart, pt has been inconsistent throughout admission. highly recommend RN staff have pt use RW until balance improves       Balance Overall balance assessment: Needs assistance Sitting-balance support: No upper extremity supported, Feet supported Sitting balance-Leahy Scale: Fair     Standing balance support: No upper extremity supported, During functional activity Standing balance-Leahy Scale: Poor Standing balance comment: with BUE support, balance is good, poor without UE assist       Cognition Arousal/Alertness: Awake/alert Behavior During Therapy: WFL for tasks assessed/performed Overall Cognitive Status: No family/caregiver present to determine baseline cognitive functioning Area of Impairment: Orientation, Following commands, Safety/judgement, Problem solving    Orientation Level:  (oriented x 4) Current Attention Level: Sustained   Following Commands:  Follows one step commands consistently, Follows multi-step commands consistently Safety/Judgement: Decreased awareness of safety, Decreased awareness of deficits  General Comments: Pt is A and O x 4. Per chart review, pt seems to be much better cognitively and physically this date           General Comments General comments (skin integrity, edema, etc.): Author had pt perform dexterity exercises and higher level static balance exercises. Pt currently requiring UE support to prevent LOB with any dynamic activity without UE support      Pertinent Vitals/Pain Pain Assessment Pain Assessment: No/denies pain     PT Goals (current goals can now be found in the care plan section) Acute Rehab PT Goals Patient Stated Goal: " Get better so I can return home safely" Progress towards PT goals: Progressing toward goals    Frequency    7X/week      PT Plan Current plan remains appropriate    Co-evaluation     PT goals addressed during session: Mobility/safety with mobility;Proper use of DME;Balance;Strengthening/ROM        AM-PAC PT "6 Clicks" Mobility   Outcome Measure  Help needed turning from your back to your side while in a flat bed without using bedrails?: A Little Help needed moving from lying on your back to sitting on the side of a flat bed without using bedrails?: A Little Help needed moving to and from a bed to a chair (including a wheelchair)?: A Little Help needed standing up from a chair using your arms (e.g., wheelchair or bedside chair)?: A Little Help needed to walk in hospital room?: A Little Help needed climbing 3-5 steps with a railing? : A Lot 6 Click Score: 17    End of Session   Activity Tolerance: Patient tolerated treatment well Patient left: in chair;with call bell/phone within reach Nurse Communication: Mobility status PT Visit Diagnosis: Muscle weakness (generalized) (M62.81);Difficulty in walking, not elsewhere classified (R26.2);Hemiplegia  and hemiparesis Hemiplegia - Right/Left: Left Hemiplegia - dominant/non-dominant: Non-dominant     Time: 7001-7494 PT Time Calculation (min) (ACUTE ONLY): 21 min  Charges:  $Neuromuscular Re-education: 8-22 mins                     Julaine Fusi PTA 03/15/22, 11:58 AM

## 2022-03-15 NOTE — Progress Notes (Signed)
Occupational Therapy Treatment Patient Details Name: Tabitha Ramirez MRN: [id number] DOB: Jan 16, 1946 Today's Date: 03/15/2022   History of present illness Pt is a 76 year old female admitetd with likely seizure with newly diagnosed lesion  in the right parietal lobe after presenting to the ED with hand shaking, confusion, and disorientation. 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   OT comments  Pt seen for OT treatment on this date. Upon arrival to room pt awake and alert lying upright in bed with family present. Pt agreeable to tx focused on neuro re-education. Pt instructed on putty exercises for LUE coordination and strengthening - multimodal cues and hand over hand assist for proper technique and coordination. Pt participated in resistive peg board activity that focused on visual scanning and LUE coordination - pt required multimodal cues for termination of task. Observed gross grasp with multiple drops from L hand, unable to manipulate peg in L hand w/o RUE assist for correct positioning, and only placing pegs on R/mid-line of peg board. Pt making good progress toward goals. Pt continues to benefit from skilled OT services to maximize return to PLOF and minimize risk of future falls, injury, caregiver burden, and readmission. Will continue to follow POC. Discharge recommendation remains appropriate.     Recommendations for follow up therapy are one component of a multi-disciplinary discharge planning process, led by the attending physician.  Recommendations may be updated based on patient status, additional functional criteria and insurance authorization.    Follow Up Recommendations  Acute inpatient rehab (3hours/day)    Assistance Recommended at Discharge Frequent or constant  Supervision/Assistance  Patient can return home with the following  A lot of help with walking and/or transfers;A lot of help with bathing/dressing/bathroom;Assistance with cooking/housework;Assistance with feeding;Assist for transportation;Direct supervision/assist for medications management   Equipment Recommendations  Other (comment) (per next venue)    Recommendations for Other Services      Precautions / Restrictions Precautions Precautions: Fall Restrictions Weight Bearing Restrictions: No       Mobility Bed Mobility Overal bed mobility: Needs Assistance Bed Mobility: Supine to Sit, Sit to Supine     Supine to sit: HOB elevated, Supervision Sit to supine: HOB elevated, Supervision             Balance Overall balance assessment: Needs assistance Sitting-balance support: No upper extremity supported, Feet supported                                       ADL either performed or assessed with clinical judgement     Cognition Arousal/Alertness: Awake/alert Behavior During Therapy: [facility] for tasks assessed/performed   Area of Impairment: Attention, Following commands, Safety/judgement                   Current Attention Level: Sustained   Following Commands: Follows one step commands consistently Safety/Judgement: Decreased awareness of safety, Decreased awareness of deficits                         Pertinent Vitals/ Pain       Pain Assessment Pain Assessment: Faces Faces Pain Scale: No hurt   Frequency  Min 4X/week        Progress Toward Goals  OT Goals(current goals can now be found in the care plan section)  Progress towards OT goals: Progressing toward goals  Acute Rehab OT Goals Patient Stated Goal: to get stronger OT Goal Formulation: With patient Time For Goal Achievement: 03/27/22 Potential to Achieve Goals: Good ADL Goals Pt Will Perform Grooming: standing;with min assist Pt Will Perform Upper Body  Dressing: with min assist;sitting Pt Will Perform Lower Body Dressing: with min assist;sit to/from stand Pt Will Transfer to Toilet: with min assist;ambulating Pt Will Perform Toileting - Clothing Manipulation and hygiene: with min assist;sit to/from stand  Plan Discharge plan remains appropriate;Frequency remains appropriate       AM-PAC OT "6 Clicks" Daily Activity     Outcome Measure   Help from another person eating meals?: A Little Help from another person taking care of personal grooming?: A Little Help from another person toileting, which includes using toliet, bedpan, or urinal?: A Lot Help from another person bathing (including washing, rinsing, drying)?: A Lot Help from another person to put on and taking off regular upper body clothing?: A Little Help from another person to put on and taking off regular lower body clothing?: A Lot 6 Click Score: 15    End of Session Equipment Utilized During Treatment: Other (comment) (none, remained seated at EOB)  OT Visit Diagnosis: Unsteadiness on feet (R26.81)   Activity Tolerance Patient tolerated treatment well   Patient Left in bed;with call bell/phone within reach;with bed alarm set   Nurse Communication          Time: 1430-1500 OT Time Calculation (min): 30 min  Charges: OT General Charges $OT Visit: 1 Visit OT Treatments $Neuromuscular Re-education: 23-37 mins  [employer], OTDS  [employer] 03/15/2022, 4:28 PM

## 2022-03-16 DIAGNOSIS — G9389 Other specified disorders of brain: Secondary | ICD-10-CM | POA: Diagnosis not present

## 2022-03-16 LAB — GLUCOSE, CAPILLARY: Glucose-Capillary: 153 mg/dL — ABNORMAL HIGH (ref 70–99)

## 2022-03-16 MED ORDER — BISACODYL 10 MG RE SUPP
RECTAL | Status: AC
  Administered 2022-03-16: 10 mg
  Filled 2022-03-16: qty 1

## 2022-03-16 NOTE — Progress Notes (Addendum)
Inpatient Rehab Admissions Coordinator:    Awaiting determination from HTA regarding prior auth request for CIR.  I will not have a bed for this patient on CIR today.  Will follow for insurance response.   1544: I did receive insurance approval for CIR admit, pending bed availability.  Will follow.   Shann Medal, PT, DPT Admissions Coordinator [phone] 03/16/22  10:35 AM

## 2022-03-16 NOTE — Progress Notes (Signed)
Occupational Therapy Treatment Patient Details Name: Tabitha Ramirez MRN: [id number] DOB: [birth year] Today's Date: 03/16/2022   History of present illness Pt is a 76 year old female admitetd with likely seizure with newly diagnosed lesion  in the right parietal lobe after presenting to the ED with hand shaking, confusion, and disorientation. 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   OT comments  Pt seen for OT treatment on this date. Upon arrival to room pt awake and alert lying upright in bed. Pt reported no pain. Pt agreeable to tx focused on grooming tasks and neuro re-education. Pt required SUPERVISION for supine<>sit, MIN A for STS and 2-3 steps to sink with vcs for safe hand placement - reaching forward for counter for support upon standing and sitting down. CGA and vcs for initiation and visual scanning during grooming tasks at sink in standing - observed LUE crossing midline to obtain items and slow motor planning of LUE during functional tasks. SUPERVISION/SETUP for feeding with hand over hand for object placement in L hand while seated in bed. Pt making good progress toward goals. Pt continues to benefit from skilled OT services to maximize return to PLOF and minimize risk of future falls, injury. Will continue to follow POC. Discharge recommendation remains appropriate.     Recommendations for follow up therapy are one component of a multi-disciplinary discharge planning process, led by the attending physician.  Recommendations may be updated based on patient status, additional functional criteria and insurance authorization.    Follow Up Recommendations  Acute inpatient rehab (3hours/day)    Assistance Recommended at Discharge Frequent or constant Supervision/Assistance  Patient can  return home with the following  A lot of help with walking and/or transfers;A lot of help with bathing/dressing/bathroom;Assistance with cooking/housework;Assistance with feeding;Assist for transportation;Direct supervision/assist for medications management   Equipment Recommendations  Other (comment) (per next venue)    Recommendations for Other Services      Precautions / Restrictions Precautions Precautions: Fall Restrictions Weight Bearing Restrictions: No       Mobility Bed Mobility Overal bed mobility: Needs Assistance Bed Mobility: Supine to Sit, Sit to Supine     Supine to sit: HOB elevated, Supervision Sit to supine: HOB elevated, Supervision        Transfers Overall transfer level: Needs assistance Equipment used: None Transfers: Sit to/from Stand Sit to Stand: Min guard           General transfer comment: posterior lean upon standing     Balance Overall balance assessment: Needs assistance Sitting-balance support: No upper extremity supported, Feet supported Sitting balance-Leahy Scale: Fair     Standing balance support: No upper extremity supported, During functional activity Standing balance-Leahy Scale: Poor                             ADL either performed or assessed with clinical judgement   ADL Overall ADL's : Needs assistance/impaired                                       General ADL Comments: CGA, with no AD and vcs for initiation and visual scanning during grooming tasks at sink in standing - observed LUE crossing midline to obtain  items and slow motor planning/initiation of LUE during funcitonal tasks. SUPERVISION/SETUP for feeding with hand over hand for object placement in L hand while seated in bed.     Praxis Praxis Praxis: Impaired Praxis Impairment Details: Initiation;Motor planning    Cognition Arousal/Alertness: Awake/alert Behavior During Therapy: WFL for tasks assessed/performed Overall  Cognitive Status: Impaired/Different from baseline Area of Impairment: Safety/judgement, Following commands, Attention, Problem solving                   Current Attention Level: Sustained   Following Commands: Follows one step commands consistently Safety/Judgement: Decreased awareness of safety, Decreased awareness of deficits   Problem Solving: Slow processing, Requires verbal cues, Requires tactile cues, Difficulty sequencing                General Comments patient remains very motivated to get to rehab hospital    Pertinent Vitals/ Pain       Pain Assessment Pain Assessment: No/denies pain   Frequency  Min 4X/week        Progress Toward Goals  OT Goals(current goals can now be found in the care plan section)  Progress towards OT goals: Progressing toward goals  Acute Rehab OT Goals Patient Stated Goal: to get stronger OT Goal Formulation: With patient Time For Goal Achievement: 03/27/22 Potential to Achieve Goals: Good ADL Goals Pt Will Perform Grooming: standing;with min assist Pt Will Perform Upper Body Dressing: with min assist;sitting Pt Will Perform Lower Body Dressing: with min assist;sit to/from stand Pt Will Transfer to Toilet: with min assist;ambulating Pt Will Perform Toileting - Clothing Manipulation and hygiene: with min assist;sit to/from stand  Plan Discharge plan remains appropriate;Frequency remains appropriate       AM-PAC OT "6 Clicks" Daily Activity     Outcome Measure   Help from another person eating meals?: A Little Help from another person taking care of personal grooming?: A Little Help from another person toileting, which includes using toliet, bedpan, or urinal?: A Lot Help from another person bathing (including washing, rinsing, drying)?: A Lot Help from another person to put on and taking off regular upper body clothing?: A Little Help from another person to put on and taking off regular lower body clothing?: A Lot 6  Click Score: 15    End of Session Equipment Utilized During Treatment: Gait belt  OT Visit Diagnosis: Unsteadiness on feet (R26.81)   Activity Tolerance Patient tolerated treatment well   Patient Left in bed;with call bell/phone within reach;with bed alarm set   Nurse Communication          Time: 2440-1027 OT Time Calculation (min): 24 min  Charges: OT General Charges $OT Visit: 1 Visit OT Treatments $Self Care/Home Management : 8-22 mins $Neuromuscular Re-education: 8-22 mins  [employer], OTDS  [employer] 03/16/2022, 1:52 PM

## 2022-03-16 NOTE — Progress Notes (Signed)
Physical Therapy Treatment Patient Details Name: Tabitha Ramirez MRN: [id number] DOB: [birth year] Today's Date: 03/16/2022   History of Present Illness Pt is a 76 year old female admitetd with likely seizure with newly diagnosed lesion  in the right parietal lobe after presenting to the ED with hand shaking, confusion, and disorientation. 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   PT Comments    Patient was agreeable to PT treatment and remains motivated to get to inpatient rehab hospital. Gait training performed in hallway today with and without rolling walker and the patient still requires physical assistance with walking. See details below for gait analysis. She is not safe to ambulate without assistance given high fall risk, even with assistive device. Continue to recommend acute inpatient rehab at discharge.    Recommendations for follow up therapy are one component of a multi-disciplinary discharge planning process, led by the attending physician.  Recommendations may be updated based on patient status, additional functional criteria and insurance authorization.  Follow Up Recommendations  Acute inpatient rehab (3hours/day)     Assistance Recommended at Discharge Frequent or constant Supervision/Assistance  Patient can return home with the following A little help with walking and/or transfers;A little help with bathing/dressing/bathroom;Assistance with cooking/housework;Direct supervision/assist for medications management;Direct supervision/assist for financial management;Assist for transportation;Help with stairs or ramp for entrance   Equipment Recommendations       Recommendations for Other Services       Precautions / Restrictions Precautions Precautions:  Fall Restrictions Weight Bearing Restrictions: No     Mobility  Bed Mobility Overal bed mobility: Needs Assistance Bed Mobility: Supine to Sit       Sit to supine: HOB elevated, Supervision   General bed mobility comments: increased time required with cues for sequencing    Transfers Overall transfer level: Needs assistance Equipment used: None Transfers: Sit to/from Stand Sit to Stand: Mod assist           General transfer comment: lifting and lowering assistance required. hand over hand required for left arm/hand placement and cues for increased eccentric control    Ambulation/Gait Ambulation/Gait assistance: Mod assist Gait Distance (Feet):  (46f, 536f [id number] device: None, Rolling walker (2 wheels) Gait Pattern/deviations: Step-to pattern, Step-through pattern, Decreased stride length, Narrow base of support Gait velocity: decreased     General Gait Details: without assistive device, patient required moderate assistance for ambulation for right side weight shifting and steadying. gait is more narrow and patient reaching out for furniture or railing in hallway for support. 2 bouts of 20 ft walking without assistive device performed. with the use of the rolling walker, patient required at least minimal assistance for rolling walker negotiation and steadying with improved base of support and step length compared to no assistive device. coordination of left foot placement is improving compared to previous ambulation bouts. she does required maximal to moderate cues for safety, left side awareness to avoid running into objects on the left, as well as navigational cues. she is not safe to ambulate without assistance given high fall risk, even with assistive device   Stairs             Wheelchair Mobility    Modified Rankin (Stroke Patients Only)       Balance Overall balance assessment: Needs assistance Sitting-balance support: No upper  extremity supported,  Feet supported Sitting balance-Leahy Scale: Fair     Standing balance support: No upper extremity supported, During functional activity Standing balance-Leahy Scale: Poor Standing balance comment: external support required to maintain static standing balance with occasional posterior and left lean initially                            Cognition Arousal/Alertness: Awake/alert Behavior During Therapy: WFL for tasks assessed/performed Overall Cognitive Status: Impaired/Different from baseline Area of Impairment: Safety/judgement                       Following Commands: Follows one step commands consistently Safety/Judgement: Decreased awareness of safety, Decreased awareness of deficits   Problem Solving: Slow processing, Requires verbal cues, Requires tactile cues, Difficulty sequencing          Exercises      General Comments General comments (skin integrity, edema, etc.): patient remains very motivated to get to rehab hospital      Pertinent Vitals/Pain Pain Assessment Pain Assessment: No/denies pain    Home Living                          Prior Function            PT Goals (current goals can now be found in the care plan section) Acute Rehab PT Goals Patient Stated Goal: to get to rehab PT Goal Formulation: With patient Time For Goal Achievement: 03/27/22 Potential to Achieve Goals: Good Progress towards PT goals: Progressing toward goals    Frequency    7X/week      PT Plan Current plan remains appropriate    Co-evaluation              AM-PAC PT "6 Clicks" Mobility   Outcome Measure  Help needed turning from your back to your side while in a flat bed without using bedrails?: A Little Help needed moving from lying on your back to sitting on the side of a flat bed without using bedrails?: A Little Help needed moving to and from a bed to a chair (including a wheelchair)?: A Little Help needed  standing up from a chair using your arms (e.g., wheelchair or bedside chair)?: A Lot Help needed to walk in hospital room?: A Lot Help needed climbing 3-5 steps with a railing? : A Lot 6 Click Score: 15    End of Session Equipment Utilized During Treatment: Gait belt Activity Tolerance: Patient tolerated treatment well Patient left: in chair;with call bell/phone within reach   PT Visit Diagnosis: Muscle weakness (generalized) (M62.81);Difficulty in walking, not elsewhere classified (R26.2);Hemiplegia and hemiparesis Hemiplegia - Right/Left: Left Hemiplegia - dominant/non-dominant: Non-dominant Hemiplegia - caused by: Other cerebrovascular disease     Time: 1003-1026 PT Time Calculation (min) (ACUTE ONLY): 23 min  Charges:  $Gait Training: 23-37 mins                     Minna Merritts, PT, MPT    Percell Locus 03/16/2022, 12:14 PM

## 2022-03-16 NOTE — Progress Notes (Signed)
Mobility Specialist - Progress Note    03/16/22 1100  Mobility  Activity Ambulated with assistance in hallway;Stood at bedside  Level of Assistance Contact guard assist, steadying assist  Assistive Device Front wheel walker  Distance Ambulated (ft) 160 ft  Activity Response Tolerated well  $Mobility charge 1 Mobility   Pt sitting in recliner upon arrival using RA. Did voice that she recently ambulated w/ PT this date but motivated to ambulated again. Completes STS MinA ---- left side weakness prominent when pushing off recliner. Ambulates 178f CGA + vc due to mild drifting. Pt is left in recliner with needs in reach.  MMerrily BrittleMobility Specialist 03/16/22, 11:35 AM

## 2022-03-16 NOTE — Progress Notes (Signed)
PROGRESS NOTE    Tabitha Ramirez  IFO:[id number] DOB: 1946/06/28 DOA: 03/09/2022 PCP: [facility]    Brief Narrative:   76 y.o. 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  Patient unable to provide history so majority of history gained from patient's son-in-law at bedside.   Patient last seen well at 2:45 PM on the day of admission.  Patient was dressing to pick up her grandchildren when she noted the hand shaking and could not get dressed.  Patient was found to be confused and presented to the emergency room.  Patient did present with left facial droop, left arm and leg drift, decree sensation with ataxia.   CT head showed right frontal parietal mass lesion likely metastases.  Neurology was called by EDP.  Code stroke was initially called.  Was canceled after CT findings were known.  Neurosurgery was contacted by EDP who recommended Decadron for vasogenic edema associated with presumptive cerebral metastasis.   Given the high likelihood of seizure as a result of the mass patient was loaded with Keppra 2000 mg in ED.  No further seizure activity noted  6/16: No clinical status changes overnight.  Slurred speech improving.  Patient remains somewhat confused.  Case discussed with neurosurgery Dr. Cari Caraway who discussed with primary oncologist.  Plan for craniotomy for tumor resection and tissue diagnosis.  Patient being consented for procedure today.  6/17: Patient seen and examined in ICU.  Discussed with neurosurgery.  Patient underwent right parietal craniectomy and resection of brain tumor this morning.  Tolerated procedure well  6/18: Patient doing well overall.  Symptomatically improving.  Okay for transfer to progressive unit    Assessment & Plan:   Principal  Problem:   Brain mass Active Problems:   Essential hypertension, benign   Anemia   Seizure (HCC)   Palliative care encounter  New brain malignancy with vasogenic edema Confusion Weakness Pathology shows metastatic carcinoma, compatible w/ patient's known mammary primary CT chest abdomen pelvis with no evidence of disease Oncology, neurology, neurosurgery engaged Status post craniotomy and tumor resection 6/17 Plan: PT/OT following, awaiting insurance auth for CIR As needed pain control Frequent neurochecks Oncology and rad onc followup after CIR, treatment would start with radiation Decadron now weaned off  New onset seizure Likely secondary to brain mass Seen by neurology in ED Status post Keppra 2000 mg load Plan: Continue Keppra 500 mg PO BID Decadron now weaned off  History of triple negative breast cancer In remission x7 years Will f/u dr. Lindi Adie outpt   Essential hypertension Bp wnl. Hold antihypertensives for now As needed hydralazine Vitals per unit protocol   Hypothyroidism PTA Synthroid   Iron deficiency anemia B12 deficiency PTA ferrous sulfate and B12  DVT prophylaxis: SQ Lovenox Code Status: Full Family Communication: Daughter telephonically 6/22 Disposition Plan: Status is: Inpatient Remains inpatient appropriate because: pending CIR placement  Level of care: Med-Surg  Consultants:  Neurosurgery Neurology Oncology Palliative care  Procedures:  Craniotomy 6/17  Antimicrobials: None   Subjective: Seen and examined.  No complaints.  Objective: Vitals:   03/15/22 2010 03/16/22 0556 03/16/22 0730 03/16/22 1148  BP: 126/67 130/67 135/75 106/66  Pulse: 82 71 67 77  Resp: '18 18 17 16  '$ Temp: 98 F (36.7 C) 98.6 F (37 C) 98.9 F (37.2 C) 98.2 F (36.8 C)  TempSrc:  Oral  SpO2: 100% 99% 100% 100%  Weight:      Height:        Intake/Output Summary (Last 24 hours) at 03/16/2022 1504 Last data filed at 03/16/2022 1455 Gross  per 24 hour  Intake 717 ml  Output 1000 ml  Net -283 ml   Filed Weights   03/10/22 0000  Weight: 65.3 kg    Examination:  General exam: No acute distress Respiratory system: Lungs clear.  Normal work of breathing.  Room air Cardiovascular system: S1-S2, RRR, no murmurs, no pedal edema Gastrointestinal system: Thin, soft, NT/ND, normal bowel sounds Central nervous system: Alert and oriented.  Left-sided weakness Extremities: Symmetric 5 x 5 power.  Left side decreased power Skin: healing scalp surgical incision Psychiatry: Flattened affect    Data Reviewed: I have personally reviewed following labs and imaging studies  CBC: Recent Labs  Lab 03/09/22 1537 03/10/22 0639  WBC 8.2 4.8  HGB 12.0 10.8*  HCT 36.4 32.0*  MCV 101.7* 98.2  PLT 238 423   Basic Metabolic Panel: Recent Labs  Lab 03/09/22 1537 03/10/22 0639  NA 141 139  K 4.1 3.7  CL 108 105  CO2 22 25  GLUCOSE 70 164*  BUN 28* 29*  CREATININE 0.95 0.93  CALCIUM 9.5 9.4   GFR: Estimated Creatinine Clearance: 45.8 mL/min (by C-G formula based on SCr of 0.93 mg/dL). Liver Function Tests: Recent Labs  Lab 03/09/22 1537  AST 33  ALT 27  ALKPHOS 84  BILITOT 1.2  PROT 8.3*  ALBUMIN 4.1   No results for input(s): "LIPASE", "AMYLASE" in the last 168 hours. No results for input(s): "AMMONIA" in the last 168 hours. Coagulation Profile: Recent Labs  Lab 03/09/22 1734 03/10/22 0639  INR 1.1 1.1   Cardiac Enzymes: Recent Labs  Lab 03/10/22 0639  CKTOTAL 116   BNP (last 3 results) No results for input(s): "PROBNP" in the last 8760 hours. HbA1C: No results for input(s): "HGBA1C" in the last 72 hours. CBG: Recent Labs  Lab 03/11/22 1239 03/12/22 0838 03/13/22 0730 03/14/22 0736 03/16/22 0857  GLUCAP 164* 127* 137* 142* 153*   Lipid Profile: No results for input(s): "CHOL", "HDL", "LDLCALC", "TRIG", "CHOLHDL", "LDLDIRECT" in the last 72 hours. Thyroid Function Tests: No results for  input(s): "TSH", "T4TOTAL", "FREET4", "T3FREE", "THYROIDAB" in the last 72 hours. Anemia Panel: No results for input(s): "VITAMINB12", "FOLATE", "FERRITIN", "TIBC", "IRON", "RETICCTPCT" in the last 72 hours. Sepsis Labs: No results for input(s): "PROCALCITON", "LATICACIDVEN" in the last 168 hours.  Recent Results (from the past 240 hour(s))  MRSA Next Gen by PCR, Nasal     Status: None   Collection Time: 03/11/22 12:41 PM   Specimen: Nasal Mucosa; Nasal Swab  Result Value Ref Range Status   MRSA by PCR Next Gen NOT DETECTED NOT DETECTED Final    Comment: (NOTE) The GeneXpert MRSA Assay (FDA approved for NASAL specimens only), is one component of a comprehensive MRSA colonization surveillance program. It is not intended to diagnose MRSA infection nor to guide or monitor treatment for MRSA infections. Test performance is not FDA approved in patients less than 47 years old. Performed at [facility], [address], [place], [place] [address]          Radiology Studies: No results found.      Scheduled Meds:  enoxaparin (LOVENOX) injection  40 mg Subcutaneous Q24H   feeding supplement  237 mL Oral TID BM   ferrous sulfate  325 mg Oral Q  breakfast   haloperidol lactate  1 mg Intravenous Once   levETIRAcetam  500 mg Oral BID   levothyroxine  50 mcg Oral Daily   rosuvastatin  10 mg Oral Daily   senna-docusate  1 tablet Oral BID   vitamin B-12  1,000 mcg Oral Daily   Continuous Infusions:     LOS: 7 days    Desma Maxim, MD [facility]   If 7PM-7AM, please contact night-coverage  03/16/2022, 3:04 PM

## 2022-03-17 ENCOUNTER — Other Ambulatory Visit: Payer: Self-pay

## 2022-03-17 ENCOUNTER — Inpatient Hospital Stay (HOSPITAL_COMMUNITY)
Admission: RE | Admit: 2022-03-17 | Discharge: 2022-03-27 | DRG: 055 | Disposition: A | Discharge status: Home / Self Care | Payer: Medicare PPO | Source: Other Acute Inpatient Hospital | Attending: Physical Medicine & Rehabilitation | Admitting: Physical Medicine & Rehabilitation

## 2022-03-17 ENCOUNTER — Encounter (HOSPITAL_COMMUNITY): Payer: Self-pay | Admitting: Physical Medicine & Rehabilitation

## 2022-03-17 DIAGNOSIS — D72829 Elevated white blood cell count, unspecified: Secondary | ICD-10-CM

## 2022-03-17 DIAGNOSIS — G9389 Other specified disorders of brain: Secondary | ICD-10-CM | POA: Diagnosis not present

## 2022-03-17 DIAGNOSIS — C7931 Secondary malignant neoplasm of brain: Principal | ICD-10-CM | POA: Diagnosis present

## 2022-03-17 DIAGNOSIS — Z79899 Other long term (current) drug therapy: Secondary | ICD-10-CM

## 2022-03-17 DIAGNOSIS — D649 Anemia, unspecified: Secondary | ICD-10-CM | POA: Diagnosis present

## 2022-03-17 DIAGNOSIS — R5381 Other malaise: Secondary | ICD-10-CM | POA: Diagnosis present

## 2022-03-17 DIAGNOSIS — I10 Essential (primary) hypertension: Secondary | ICD-10-CM | POA: Diagnosis present

## 2022-03-17 DIAGNOSIS — D496 Neoplasm of unspecified behavior of brain: Secondary | ICD-10-CM | POA: Diagnosis present

## 2022-03-17 DIAGNOSIS — G620 Drug-induced polyneuropathy: Secondary | ICD-10-CM | POA: Diagnosis not present

## 2022-03-17 DIAGNOSIS — Z880 Allergy status to penicillin: Secondary | ICD-10-CM | POA: Diagnosis not present

## 2022-03-17 DIAGNOSIS — D72823 Leukemoid reaction: Secondary | ICD-10-CM | POA: Diagnosis not present

## 2022-03-17 DIAGNOSIS — E785 Hyperlipidemia, unspecified: Secondary | ICD-10-CM | POA: Diagnosis present

## 2022-03-17 DIAGNOSIS — R7989 Other specified abnormal findings of blood chemistry: Secondary | ICD-10-CM | POA: Diagnosis not present

## 2022-03-17 DIAGNOSIS — Z853 Personal history of malignant neoplasm of breast: Secondary | ICD-10-CM

## 2022-03-17 DIAGNOSIS — G629 Polyneuropathy, unspecified: Secondary | ICD-10-CM | POA: Diagnosis present

## 2022-03-17 DIAGNOSIS — N393 Stress incontinence (female) (male): Secondary | ICD-10-CM | POA: Diagnosis present

## 2022-03-17 DIAGNOSIS — F17210 Nicotine dependence, cigarettes, uncomplicated: Secondary | ICD-10-CM | POA: Diagnosis present

## 2022-03-17 DIAGNOSIS — E039 Hypothyroidism, unspecified: Secondary | ICD-10-CM | POA: Diagnosis present

## 2022-03-17 DIAGNOSIS — D509 Iron deficiency anemia, unspecified: Secondary | ICD-10-CM

## 2022-03-17 LAB — CBC
HCT: 27 % — ABNORMAL LOW (ref 36.0–46.0)
Hemoglobin: 9.1 g/dL — ABNORMAL LOW (ref 12.0–15.0)
MCH: 34.6 pg — ABNORMAL HIGH (ref 26.0–34.0)
MCHC: 33.7 g/dL (ref 30.0–36.0)
MCV: 102.7 fL — ABNORMAL HIGH (ref 80.0–100.0)
Platelets: 246 10*3/uL (ref 150–400)
RBC: 2.63 MIL/uL — ABNORMAL LOW (ref 3.87–5.11)
RDW: 14.6 % (ref 11.5–15.5)
WBC: 11.9 10*3/uL — ABNORMAL HIGH (ref 4.0–10.5)
nRBC: 0.2 % (ref 0.0–0.2)

## 2022-03-17 LAB — CREATININE, SERUM
Creatinine, Ser: 0.86 mg/dL (ref 0.44–1.00)
GFR, Estimated: >60 mL/min (ref >60)

## 2022-03-17 MED ORDER — ONDANSETRON HCL 4 MG/2ML IJ SOLN: 4.0000 mg | Freq: Four times a day (QID) | INTRAMUSCULAR | Status: DC | PRN

## 2022-03-17 MED ORDER — ALBUTEROL SULFATE (2.5 MG/3ML) 0.083% IN NEBU: 2.5000 mg | INHALATION_SOLUTION | RESPIRATORY_TRACT | Status: DC | PRN

## 2022-03-17 MED ORDER — LEVETIRACETAM 500 MG PO TABS: 500.0000 mg | ORAL_TABLET | Freq: Two times a day (BID) | ORAL | Status: DC

## 2022-03-17 MED ORDER — VITAMIN B-12 1000 MCG PO TABS
1000.0000 ug | ORAL_TABLET | Freq: Every day | ORAL | Status: DC
  Administered 2022-03-18 – 2022-03-27 (×10): 1000 ug via ORAL
  Filled 2022-03-17 (×10): qty 1

## 2022-03-17 MED ORDER — LEVETIRACETAM 250 MG PO TABS
500.0000 mg | ORAL_TABLET | Freq: Two times a day (BID) | ORAL | Status: DC
  Administered 2022-03-17 – 2022-03-27 (×20): 500 mg via ORAL
  Filled 2022-03-17 (×20): qty 2

## 2022-03-17 MED ORDER — HYDROCODONE-ACETAMINOPHEN 5-325 MG PO TABS: 1.0000 | ORAL_TABLET | ORAL | Status: DC | PRN

## 2022-03-17 MED ORDER — ENOXAPARIN SODIUM 40 MG/0.4ML IJ SOSY: 40.0000 mg | PREFILLED_SYRINGE | INTRAMUSCULAR | Status: DC

## 2022-03-17 MED ORDER — FERROUS SULFATE 325 (65 FE) MG PO TABS
325.0000 mg | ORAL_TABLET | Freq: Every day | ORAL | Status: DC
  Administered 2022-03-18 – 2022-03-27 (×10): 325 mg via ORAL
  Filled 2022-03-17 (×10): qty 1

## 2022-03-17 MED ORDER — ENSURE ENLIVE PO LIQD
237.0000 mL | Freq: Three times a day (TID) | ORAL | Status: DC
  Administered 2022-03-17 – 2022-03-26 (×28): 237 mL via ORAL

## 2022-03-17 MED ORDER — BISACODYL 10 MG RE SUPP: 10.0000 mg | Freq: Every day | RECTAL | Status: DC | PRN

## 2022-03-17 MED ORDER — ONDANSETRON HCL 4 MG PO TABS: 4.0000 mg | ORAL_TABLET | Freq: Four times a day (QID) | ORAL | Status: DC | PRN

## 2022-03-17 MED ORDER — POLYETHYLENE GLYCOL 3350 17 G PO PACK: 17.0000 g | PACK | Freq: Every day | ORAL | Status: DC | PRN

## 2022-03-17 MED ORDER — LORAZEPAM 2 MG/ML IJ SOLN: 2.0000 mg | INTRAMUSCULAR | 0 refills | Status: DC | PRN

## 2022-03-17 MED ORDER — LEVOTHYROXINE SODIUM 50 MCG PO TABS
50.0000 ug | ORAL_TABLET | Freq: Every day | ORAL | Status: DC
  Administered 2022-03-18 – 2022-03-27 (×10): 50 ug via ORAL
  Filled 2022-03-17 (×10): qty 1

## 2022-03-17 MED ORDER — ROSUVASTATIN CALCIUM 5 MG PO TABS
10.0000 mg | ORAL_TABLET | Freq: Every day | ORAL | Status: DC
  Administered 2022-03-18 – 2022-03-27 (×10): 10 mg via ORAL
  Filled 2022-03-17 (×10): qty 2

## 2022-03-17 MED ORDER — ENOXAPARIN SODIUM 40 MG/0.4ML IJ SOSY
40.0000 mg | PREFILLED_SYRINGE | INTRAMUSCULAR | Status: DC
  Administered 2022-03-17 – 2022-03-26 (×10): 40 mg via SUBCUTANEOUS
  Filled 2022-03-17 (×10): qty 0.4

## 2022-03-17 MED ORDER — ACETAMINOPHEN 325 MG PO TABS: 325.0000 mg | ORAL_TABLET | ORAL | Status: DC | PRN

## 2022-03-17 MED ORDER — SENNOSIDES-DOCUSATE SODIUM 8.6-50 MG PO TABS
1.0000 | ORAL_TABLET | Freq: Two times a day (BID) | ORAL | Status: DC
Start: 2022-03-17 — End: 2022-03-27
  Administered 2022-03-17 – 2022-03-27 (×15): 1 via ORAL
  Filled 2022-03-17 (×19): qty 1

## 2022-03-17 NOTE — TOC Transition Note (Signed)
Transition of Care [facility]) - CM/SW Discharge Note   Patient Details  Name: Tabitha Ramirez MRN: [id number] Date of Birth: 1946-06-04  Transition of Care [facility] CM/SW Contact:  Maree Krabbe, LCSW Phone Number: 03/17/2022, 10:11 AM   Clinical Narrative:   Pt admitting to CIR today.          Patient Goals and CMS Choice        Discharge Placement                       Discharge Plan and Services                                     Social Determinants of Health (SDOH) Interventions     Readmission Risk Interventions     No data to display

## 2022-03-17 NOTE — Progress Notes (Signed)
PMR Admission Coordinator Pre-Admission Assessment   Patient: Tabitha Ramirez is an 76 y.o., female MRN: [id number] DOB: 02-05-1946 Height: 5\' 4"  (162.6 cm) Weight: 65.3 kg   Insurance Information HMO:     PPO: yes     PCP:      IPA:      80/20:      OTHER:  PRIMARY: [employer] CHO    Policy#: [id number]     Subscriber: patient CM Name: Michaelle Copas      Phone#: [phone]     Fax#: [phone] Pre-Cert#: [id number] auth for CIR from [place] with [employer] with updates due to Livia Snellen at fax listed above on 6/29 (phone [phone] ext [phone])      Employer:  Benefits:  Phone #: [phone]     Name:  Eff. Date: 09/26/19     Deduct: $0      Out of Pocket Max: $4000 (met $90)      Life Max:  CIR: $160/day for days 1-10      SNF: 100% Outpatient:      Co-Pay: $20/visit Home Health: 100%      Co-Pay:  DME: 80%     Co-Ins: 20% Providers: in-network SECONDARY:       Policy#:      Phone#:    Artist:       Phone#:    The Data processing manager" for patients in Inpatient Rehabilitation Facilities with attached "Privacy Act Statement-Health Care Records" was provided and verbally reviewed with: Family   Emergency Contact Information Contact Information       Name Relation Home Work Mobile    Smith-Ramirez,Tabitha Daughter [phone]   [phone]    Ramirez,Derrick Son     [phone]           Current Medical History  Patient Admitting Diagnosis: debility s/p right parietal craniotomy for tumor resection   History of Present Illness: Tabitha Donoso. Ramirez is a 76 year old  female with PMH of hypertension, hypothyroidism, history of tobacco use, peripheral neuropathy followed by Dr. 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  Last breast exam 08/02/2021 benign mammogram 09/23/2020 benign. Pt presented to [facility] 03/09/2022 with headache, altered mental status, left facial droop, as well as left arm and leg drift with suspect seizure.  CT/MRI of the brain showed a 3.0 x 2.3 x 1.8 cm irregular enhancing mass involving the right parietal lobe with localized vasogenic edema.  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  EEG did show mild to moderate diffuse encephalopathy and negative for seizure.  Admission chemistries unremarkable except BUN 28.  Patient went right parietal craniotomy for resection of brain tumor 03/11/2022 per Dr. Marcell Barlow.  Patient currently remains on Keppra for seizure prophylaxis.  She was cleared to begin Lovenox for DVT prophylaxis 03/12/2022.  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  CT chest abdomen pelvis no evidence of disease.  Follow-up oncology services Dr. Smith Robert and initially placed on steroid taper (now completed) and patient would follow-up on discharge from CIR with Dr. Pamelia Hoit.  She is tolerating a regular diet.  Therapy evaluations completed and pt was recommended for a comprehensive rehab program.   Complete NIHSS  TOTAL: 1   Patient's medical record from [facility] has been reviewed by the rehabilitation admission coordinator and physician.   Past Medical History      Past Medical History:  Diagnosis Date   Allergy     Anemia     Arthritis      "joints" (09/29/2013)   Breast cancer (HCC)     GERD (gastroesophageal reflux disease)     Heart murmur     History of blood transfusion      "related to chemo/breast cancer" (09/29/2013)   Hypertension     Migraines     Personal history of chemotherapy     Personal history of radiation therapy     Radiation 11/05/13-12/24/13    Right  breast    TMJ (temporomandibular joint syndrome)      "left; just dx'd" (09/29/2013      Has the patient had major surgery during 100 days prior to admission? Yes   Family History   family history includes Breast cancer in her maternal grandmother and mother; Cancer (age of onset: 60) in her maternal grandmother and mother; Colon cancer in her brother; Congestive Heart Failure in her brother and mother; Diabetes (age of onset: 56) in her brother; Heart disease in her brother.   Current Medications   Current Facility-Administered Medications:    acetaminophen (TYLENOL) tablet 1,000 mg, 1,000 mg, Oral, Q6H PRN, Venetia Night, MD, 1,000 mg at 03/12/22 0834   albuterol (PROVENTIL) (2.5 MG/3ML) 0.083% nebulizer solution 2.5 mg, 2.5 mg, Nebulization, Q2H PRN, Venetia Night, MD   bisacodyl (DULCOLAX) suppository 10 mg, 10 mg, Rectal, Daily PRN, Manuela Schwartz, NP   enoxaparin (LOVENOX) injection 40 mg, 40 mg, Subcutaneous, Q24H, Venetia Night, MD, 40 mg at 03/16/22 2211   feeding supplement (ENSURE ENLIVE / ENSURE PLUS) liquid 237 mL, 237 mL, Oral, TID BM, Venetia Night, MD, 237 mL at 03/17/22 4098   ferrous sulfate tablet 325 mg, 325 mg, Oral, Q breakfast, Venetia Night, MD, 325 mg at 03/17/22 1191   haloperidol lactate (HALDOL) injection 1 mg, 1 mg, Intravenous, Once, Venetia Night, MD   hydrALAZINE (APRESOLINE) injection 10 mg, 10 mg, Intravenous, Q4H PRN, Venetia Night, MD   HYDROcodone-acetaminophen (NORCO/VICODIN) 5-325 MG per tablet 1 tablet, 1 tablet, Oral, Q4H PRN, Venetia Night, MD   levETIRAcetam (KEPPRA) tablet 500 mg, 500 mg, Oral, BID, Venetia Night, MD, 500 mg at 03/17/22 4782   levothyroxine (SYNTHROID) tablet 50 mcg, 50 mcg, Oral, Daily, Venetia Night, MD, 50 mcg at 03/17/22 0555   LORazepam (ATIVAN) injection 2 mg, 2 mg, Intravenous, Q4H PRN, Venetia Night, MD, 2 mg at 03/10/22 1507   ondansetron (ZOFRAN) tablet 4 mg, 4 mg,  Oral, Q6H PRN **OR** ondansetron (ZOFRAN) injection 4 mg, 4 mg, Intravenous, Q6H PRN, Venetia Night, MD, 4 mg at 03/10/22 1505   Oral care mouth rinse, 15 mL, Mouth Rinse, PRN, Sreenath, Sudheer B, MD   oxyCODONE-acetaminophen (PERCOCET/ROXICET) 5-325 MG per tablet 1 tablet, 1 tablet, Oral, Q8H PRN, Venetia Night, MD, 1 tablet at 03/11/22 1747   polyethylene glycol (MIRALAX / GLYCOLAX) packet 17 g, 17 g, Oral, Daily PRN, Georgeann Oppenheim, Sudheer B, MD, 17 g at 03/15/22 1353   rosuvastatin (CRESTOR) tablet 10 mg, 10 mg, Oral, Daily, Wouk, Wilfred Curtis, MD, 10 mg at 03/17/22 9562   senna-docusate (Senokot-S) tablet 1 tablet, 1 tablet, Oral, BID, Lolita Patella B, MD, 1 tablet at 03/17/22 1308   vitamin B-12 (CYANOCOBALAMIN) tablet 1,000 mcg, 1,000 mcg, Oral, Daily,  Venetia Night, MD, 1,000 mcg at 03/17/22 1610   Patients Current Diet:  Diet Order                  Diet - low sodium heart healthy             Diet regular Room service appropriate? Yes; Fluid consistency: Thin  Diet effective now                         Precautions / Restrictions Precautions Precautions: Fall Precaution Comments: L field cut Restrictions Weight Bearing Restrictions: No    Has the patient had 2 or more falls or a fall with injury in the past year? No   Prior Activity Level Community (5-7x/wk): driving, taking care of her grandson, no DME at baseline   Prior Functional Level Self Care: Did the patient need help bathing, dressing, using the toilet or eating? Independent   Indoor Mobility: Did the patient need assistance with walking from room to room (with or without device)? Independent   Stairs: Did the patient need assistance with internal or external stairs (with or without device)? Independent   Functional Cognition: Did the patient need help planning regular tasks such as shopping or remembering to take medications? Independent   Patient Information Are you of Hispanic,  Latino/a,or Spanish origin?: A. No, not of Hispanic, Latino/a, or Spanish origin What is your race?: B. Black or African American Do you need or want an interpreter to communicate with a doctor or health care staff?: 0. No   Patient's Response To:  Health Literacy and Transportation Is the patient able to respond to health literacy and transportation needs?: Yes Health Literacy - How often do you need to have someone help you when you read instructions, pamphlets, or other written material from your doctor or pharmacy?: Never In the past 12 months, has lack of transportation kept you from medical appointments or from getting medications?: No In the past 12 months, has lack of transportation kept you from meetings, work, or from getting things needed for daily living?: No   Journalist, newspaper / Equipment Home Assistive Devices/Equipment: None Home Equipment: Tub bench   Prior Device Use: Indicate devices/aids used by the patient prior to current illness, exacerbation or injury? None of the above   Current Functional Level Cognition   Overall Cognitive Status: Impaired/Different from baseline Current Attention Level: Sustained Orientation Level: Oriented X4 Following Commands: Follows one step commands consistently Safety/Judgement: Decreased awareness of safety, Decreased awareness of deficits General Comments: Pt is A and O x 4. Per chart review, pt seems to be much better cognitively and physically this date    Extremity Assessment (includes Sensation/Coordination)   Upper Extremity Assessment: LUE deficits/detail LUE Deficits / Details: ROM/strength WFL noted during functional assessment. LUE Sensation: decreased light touch, decreased proprioception LUE Coordination: decreased fine motor, decreased gross motor  Lower Extremity Assessment: Defer to PT evaluation LLE Deficits / Details: left knee extension 5/5, dorsiflexion 5/5. LLE Sensation: decreased light touch (absent to  decreased proximally, decreased distally) LLE Coordination: decreased fine motor, decreased gross motor (heel to shin with mild dysmetria. decreased coorindation and smoothness step length/foot placement while ambulating)     ADLs   Overall ADL's : Needs assistance/impaired Eating/Feeding: NPO Grooming: Wash/dry face, Sitting, Set up Grooming Details (indicate cue type and reason): able to attend to L side of face with intermittent vcs Lower Body Dressing: Maximal assistance Lower Body Dressing Details (indicate  cue type and reason): socks Toilet Transfer: Moderate assistance, Stand-pivot, BSC/3in1 Toileting- Clothing Manipulation and Hygiene: Maximal assistance, Sit to/from stand Functional mobility during ADLs: Minimal assistance, Moderate assistance, +2 for physical assistance General ADL Comments: CGA, with no AD and vcs for initiation and visual scanning during grooming tasks at sink in standing - observed LUE crossing midline to obtain items and slow motor planning/initiation of LUE during funcitonal tasks. SUPERVISION/SETUP for feeding with hand over hand for object placement in L hand while seated in bed.     Mobility   Overal bed mobility: Needs Assistance Bed Mobility: Supine to Sit Supine to sit: HOB elevated, Supervision Sit to supine: HOB elevated, Supervision General bed mobility comments: increased time required with cues for sequencing     Transfers   Overall transfer level: Needs assistance Equipment used: None Transfers: Sit to/from Stand Sit to Stand: Mod assist General transfer comment: lifting and lowering assistance required. hand over hand required for left arm/hand placement and cues for increased eccentric control     Ambulation / Gait / Stairs / Wheelchair Mobility   Ambulation/Gait Ambulation/Gait assistance: Mod assist Gait Distance (Feet):  (70ft, 36ft, 72ft, 31ft) Assistive device: None, Rolling walker (2 wheels) Gait Pattern/deviations: Step-to  pattern, Step-through pattern, Decreased stride length, Narrow base of support General Gait Details: without assistive device, patient required moderate assistance for ambulation for right side weight shifting and steadying. gait is more narrow and patient reaching out for furniture or railing in hallway for support. 2 bouts of 20 ft walking without assistive device performed. with the use of the rolling walker, patient required at least minimal assistance for rolling walker negotiation and steadying with improved base of support and step length compared to no assistive device. coordination of left foot placement is improving compared to previous ambulation bouts. she does required maximal to moderate cues for safety, left side awareness to avoid running into objects on the left, as well as navigational cues. she is not safe to ambulate without assistance given high fall risk, even with assistive device Gait velocity: decreased Pre-gait activities: faciliation for weight shifting to right prior to standing     Posture / Balance Balance Overall balance assessment: Needs assistance Sitting-balance support: No upper extremity supported, Feet supported Sitting balance-Leahy Scale: Fair Postural control: Posterior lean, Left lateral lean Standing balance support: No upper extremity supported, During functional activity Standing balance-Leahy Scale: Poor Standing balance comment: external support required to maintain static standing balance with occasional posterior and left lean initially     Special needs/care consideration Special service needs f/u outpatient rad/onc with Dr. Ivy Lynn at discharge    Previous Home Environment (from acute therapy documentation) Living Arrangements: Alone (takes care of grandson at daughter's house) Available Help at Discharge: Family, Available 24 hours/day Type of Home: House Home Layout: One level Home Access: Stairs to enter Entrance Stairs-Rails: None Entrance  Stairs-Number of Steps: 2 Bathroom Shower/Tub: Associate Professor: Yes How Accessible: Accessible via walker Home Care Services: No   Discharge Living Setting Plans for Discharge Living Setting: Patient's home (daughter and son-in-law will be staying with pt) Type of Home at Discharge: House Discharge Home Layout: One level Discharge Home Access: Stairs to enter Entrance Stairs-Rails: None Entrance Stairs-Number of Steps: 2 Discharge Bathroom Shower/Tub: Tub/shower unit Discharge Bathroom Toilet: Standard Discharge Bathroom Accessibility: Yes How Accessible: Accessible via walker Does the patient have any problems obtaining your medications?: No   Social/Family/Support Systems Patient Roles: Caregiver (takes care of  grandson while parents are away) Anticipated Caregiver: Vickie Epley (daughter) and son-in-law Anticipated Caregiver's Contact Information: [name] Smith-Ramirez:[phone] Caregiver Availability: 24/7 Discharge Plan Discussed with Primary Caregiver: Yes Is Caregiver In Agreement with Plan?: Yes Does Caregiver/Family have Issues with Lodging/Transportation while Pt is in Rehab?: No   Goals Patient/Family Goal for Rehab: PT/OT/SLP supervision Expected length of stay: 7-10 days Pt/Family Agrees to Admission and willing to participate: Yes Program Orientation Provided & Reviewed with Pt/Caregiver Including Roles  & Responsibilities: Yes  Barriers to Discharge: Insurance for SNF coverage   Decrease burden of Care through IP rehab admission: NA   Possible need for SNF placement upon discharge: Not anticipated   Patient Condition: I have reviewed medical records from [facility], spoken with CSW, and patient and daughter. I discussed via phone for inpatient rehabilitation assessment.  Patient will benefit from ongoing PT, OT, and SLP, can actively participate in 3 hours of therapy a day 5 days  of the week, and can make measurable gains during the admission.  Patient will also benefit from the coordinated team approach during an Inpatient Acute Rehabilitation admission.  The patient will receive intensive therapy as well as Rehabilitation physician, nursing, social worker, and care management interventions.  Due to safety, skin/wound care, disease management, medication administration, pain management, and patient education the patient requires 24 hour a day rehabilitation nursing.  The patient is currently min assist with mobility and basic ADLs.  Discharge setting and therapy post discharge at home with home health is anticipated.  Patient has agreed to participate in the Acute Inpatient Rehabilitation Program and will admit today.   Preadmission Screen Completed By: Estill Dooms, PT, DPT 03/17/2022 10:38 AM ______________________________________________________________________   Discussed status with Dr. Natale Lay on 03/17/22  at 10:39 AM  and received approval for admission today.   Admission Coordinator:  Domingo Pulse, CCC-SLP, and Estill Dooms, PT, DPT time 10:39 AM Dorna Bloom 03/17/22     Assessment/Plan: Diagnosis: hypertension, hypothyroidism, history of tobacco use, peripheral neuropathy Does the need for close, 24 hr/day Medical supervision in concert with the patient's rehab needs make it unreasonable for this patient to be served in a less intensive setting? Yes Co-Morbidities requiring supervision/potential complications: hypertension, hypothyroidism, history of tobacco use, peripheral neuropathy Due to bladder management, bowel management, safety, skin/wound care, disease management, medication administration, pain management, and patient education, does the patient require 24 hr/day rehab nursing? Yes Does the patient require coordinated care of a physician, rehab nurse, PT, OT, and SLP to address physical and functional deficits in the context of the above medical  diagnosis(es)? Yes Addressing deficits in the following areas: balance, endurance, locomotion, strength, transferring, bowel/bladder control, bathing, dressing, feeding, grooming, toileting, cognition, speech, language, swallowing, and psychosocial support Can the patient actively participate in an intensive therapy program of at least 3 hrs of therapy 5 days a week? Yes The potential for patient to make measurable gains while on inpatient rehab is excellent Anticipated functional outcomes upon discharge from inpatient rehab: supervision PT, supervision OT, supervision SLP Estimated rehab length of stay to reach the above functional goals is: 7-10 Anticipated discharge destination: Home 10. Overall Rehab/Functional Prognosis: excellent     MD Signature: Fanny Dance

## 2022-03-17 NOTE — Progress Notes (Signed)
Inpatient Rehabilitation Admission Medication Review by a Pharmacist  A complete drug regimen review was completed for this patient to identify any potential clinically significant medication issues.  High Risk Drug Classes Is patient taking? Indication by Medication  Antipsychotic No   Anticoagulant Yes Lovenox- VTE prophylaxis  Antibiotic No   Opioid Yes norco- acute pain  Antiplatelet No   Hypoglycemics/insulin No   Vasoactive Medication No   Chemotherapy No   Other Yes Crestor- HLD Keppra- seizure prophylaxis Synthroid- hypothyroidism Feec, B-12- anemia     Type of Medication Issue Identified Description of Issue Recommendation(s)  Drug Interaction(s) (clinically significant)     Duplicate Therapy     Allergy     No Medication Administration End Date     Incorrect Dose     Additional Drug Therapy Needed     Significant med changes from prior encounter (inform family/care partners about these prior to discharge).    Other  PTA meds: Klor-con 10 meq daily Maxzide-25 take 1/2 tab every other day Restart PTA meds when and if necessary during CIR admission or at time of discharge, if warranted    Clinically significant medication issues were identified that warrant physician communication and completion of prescribed/recommended actions by midnight of the next day:  No  Time spent performing this drug regimen review (minutes):  30   Lateisha Thurlow BS, PharmD, BCPS Clinical Pharmacist 03/17/2022 12:57 PM  Contact: [phone] after 3 PM  "Be curious, not judgmental..." -Debbora Dus

## 2022-03-17 NOTE — Discharge Instructions (Addendum)
Inpatient Rehab Discharge Instructions  SARAIYAH HEMMINGER Discharge date and time: No discharge date for patient encounter.   Activities/Precautions/ Functional Status: Activity: activity as tolerated Diet: regular diet Wound Care: Routine skin checks Functional status:  ___ No restrictions     ___ Walk up steps independently ___ 24/7 supervision/assistance   ___ Walk up steps with assistance ___ Intermittent supervision/assistance  ___ Bathe/dress independently ___ Walk with walker     __x_ Bathe/dress with assistance ___ Walk Independently    ___ Shower independently ___ Walk with assistance    ___ Shower with assistance ___ No alcohol     ___ Return to work/school ________   COMMUNITY REFERRALS UPON DISCHARGE:    Outpatient: PT     OT    ST                 Agency:[facility]      ZOXWR:[phone]              Appointment Date/Time:*Please expect follow-up within 7-10 business days to to schedule your appointment. If you have not received follow-up, be sure to contact the site directly.*  Medical Equipment/Items Ordered: rolling walker ( has already)                                                 Agency/Supplier:N/A   Special Instructions: No driving smoking or alcohol     My questions have been answered and I understand these instructions. I will adhere to these goals and the provided educational materials after my discharge from the hospital.  Patient/Caregiver Signature _______________________________ Date __________  Clinician Signature _______________________________________ Date __________  Please bring this form and your medication list with you to all your follow-up doctor's appointments.

## 2022-03-18 DIAGNOSIS — D496 Neoplasm of unspecified behavior of brain: Secondary | ICD-10-CM | POA: Diagnosis not present

## 2022-03-18 NOTE — Progress Notes (Signed)
PROGRESS NOTE   Subjective/Complaints:  No issues overnite , no pain issues   ROS- neg CP, SOB, N/V/D, + stress incont (per PT)  Objective:   No results found. Recent Labs    03/17/22 1351  WBC 11.9*  HGB 9.1*  HCT 27.0*  PLT 246   Recent Labs    03/17/22 1351  CREATININE 0.86    Intake/Output Summary (Last 24 hours) at 03/18/2022 0826 Last data filed at 03/17/2022 2335 Gross per 24 hour  Intake 354 ml  Output 1 ml  Net 353 ml        Physical Exam: Vital Signs Blood pressure 121/69, pulse 71, temperature 98.2 F (36.8 C), resp. rate 18, height 5\' 4"  (1.626 m), weight 63.4 kg, SpO2 100 %.   General: No acute distress Mood and affect are appropriate Heart: Regular rate and rhythm no rubs murmurs or extra sounds Lungs: Clear to auscultation, breathing unlabored, no rales or wheezes Abdomen: Positive bowel sounds, soft nontender to palpation, nondistended Extremities: No clubbing, cyanosis, or edema Skin: No evidence of breakdown, no evidence of rash Neurologic: Cranial nerves II through XII intact, motor strength is 5/5 in bilateral deltoid, bicep, tricep, grip, hip flexor, knee extensors, ankle dorsiflexor and plantar flexor Sensory exam equal LT in BUE (diminished symmetrically) Musculoskeletal: Full range of motion in all 4 extremities. No joint swelling    Assessment/Plan: 1. Functional deficits which require 3+ hours per day of interdisciplinary therapy in a comprehensive inpatient rehab setting. Physiatrist is providing close team supervision and 24 hour management of active medical problems listed below. Physiatrist and rehab team continue to assess barriers to discharge/monitor patient progress toward functional and medical goals  Care Tool:  Bathing              Bathing assist       Upper Body Dressing/Undressing Upper body dressing        Upper body assist      Lower Body  Dressing/Undressing Lower body dressing            Lower body assist       Toileting Toileting    Toileting assist       Transfers Chair/bed transfer  Transfers assist           Locomotion Ambulation   Ambulation assist              Walk 10 feet activity   Assist           Walk 50 feet activity   Assist           Walk 150 feet activity   Assist           Walk 10 feet on uneven surface  activity   Assist           Wheelchair     Assist               Wheelchair 50 feet with 2 turns activity    Assist            Wheelchair 150 feet activity     Assist          Blood pressure 121/69,  pulse 71, temperature 98.2 F (36.8 C), resp. rate 18, height 5\' 4"  (1.626 m), weight 63.4 kg, SpO2 100 %.  Medical Problem List and Plan: 1. 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  Status post right parietal craniotomy resection of brain tumor 03/11/2022.  Patient is to follow-up outpatient oncology services Dr.Gudena             -patient may shower cover incision             -ELOS/Goals: 7-10 days/ Sup with PT/OT/SLP            -Admit to CIR 2.  Antithrombotics: -DVT/anticoagulation:  Pharmaceutical: Lovenox initiated 03/12/2022             -antiplatelet therapy: N/A 3. Pain Management: Hydrocodone as needed 4. Mood/Sleep: Provide emotional support             -antipsychotic agents: N/A 5. Neuropsych/cognition: This patient is capable of making decisions on her own behalf. 6. Skin/Wound Care: Routine skin checks 7. Fluids/Electrolytes/Nutrition: Routine in and outs with follow-up chemistries 8.  Seizure prophylaxis.  EEG negative.  Keppra 500 mg twice daily -She denies any further seizures 9.  Hyperlipidemia.  Crestor 10mg  10.  Hypothyroidism.  Synthroid 50 mcg dialy 11.  Anemia.  Continue iron supplement. Last HGB 9.1  Follow-up CBC 12.  History of  tobacco use.  Provide counseling 13.  History of peripheral neuropathy.  Patient has been seen by Dr. Terrace Arabia in the past. 14. Suspect carpal tunnel L wrist. She reports she may have been told this in the past. Consider night time use wrist splint. 15. Leukocytosis, no signs of infection at this time, likely due to prior Decadron, monitor    LOS: 1 days A FACE TO FACE EVALUATION WAS PERFORMED  Erick Colace 03/18/2022, 8:26 AM

## 2022-03-19 DIAGNOSIS — D496 Neoplasm of unspecified behavior of brain: Secondary | ICD-10-CM | POA: Diagnosis not present

## 2022-03-20 DIAGNOSIS — D72823 Leukemoid reaction: Secondary | ICD-10-CM | POA: Diagnosis not present

## 2022-03-20 DIAGNOSIS — R7989 Other specified abnormal findings of blood chemistry: Secondary | ICD-10-CM | POA: Diagnosis not present

## 2022-03-20 DIAGNOSIS — D496 Neoplasm of unspecified behavior of brain: Secondary | ICD-10-CM | POA: Diagnosis not present

## 2022-03-20 LAB — COMPREHENSIVE METABOLIC PANEL
ALT: 42 U/L (ref 0–44)
AST: 28 U/L (ref 15–41)
Albumin: 2.5 g/dL — ABNORMAL LOW (ref 3.5–5.0)
Alkaline Phosphatase: 59 U/L (ref 38–126)
Anion gap: 5 (ref 5–15)
BUN: 27 mg/dL — ABNORMAL HIGH (ref 8–23)
CO2: 26 mmol/L (ref 22–32)
Calcium: 8.4 mg/dL — ABNORMAL LOW (ref 8.9–10.3)
Chloride: 103 mmol/L (ref 98–111)
Creatinine, Ser: 0.95 mg/dL (ref 0.44–1.00)
GFR, Estimated: >60 mL/min (ref >60)
Glucose, Bld: 104 mg/dL — ABNORMAL HIGH (ref 70–99)
Potassium: 3.9 mmol/L (ref 3.5–5.1)
Sodium: 134 mmol/L — ABNORMAL LOW (ref 135–145)
Total Bilirubin: 0.5 mg/dL (ref 0.3–1.2)
Total Protein: 6.2 g/dL — ABNORMAL LOW (ref 6.5–8.1)

## 2022-03-20 LAB — CBC WITH DIFFERENTIAL/PLATELET
Abs Immature Granulocytes: 0.22 10*3/uL — ABNORMAL HIGH (ref 0.00–0.07)
Basophils Absolute: 0 10*3/uL (ref 0.0–0.1)
Basophils Relative: 0 %
Eosinophils Absolute: 0.4 10*3/uL (ref 0.0–0.5)
Eosinophils Relative: 4 %
HCT: 28.1 % — ABNORMAL LOW (ref 36.0–46.0)
Hemoglobin: 9.1 g/dL — ABNORMAL LOW (ref 12.0–15.0)
Immature Granulocytes: 2 %
Lymphocytes Relative: 21 %
Lymphs Abs: 2.2 10*3/uL (ref 0.7–4.0)
MCH: 33 pg (ref 26.0–34.0)
MCHC: 32.4 g/dL (ref 30.0–36.0)
MCV: 101.8 fL — ABNORMAL HIGH (ref 80.0–100.0)
Monocytes Absolute: 0.9 10*3/uL (ref 0.1–1.0)
Monocytes Relative: 9 %
Neutro Abs: 6.4 10*3/uL (ref 1.7–7.7)
Neutrophils Relative %: 64 %
Platelets: 256 10*3/uL (ref 150–400)
RBC: 2.76 MIL/uL — ABNORMAL LOW (ref 3.87–5.11)
RDW: 14.8 % (ref 11.5–15.5)
WBC: 10.1 10*3/uL (ref 4.0–10.5)
nRBC: 0 % (ref 0.0–0.2)

## 2022-03-20 NOTE — Progress Notes (Signed)
Speech Language Pathology Daily Session Note  Patient Details  Name: TYYONNA ROLLERSON MRN: [id number] Date of Birth: Oct 06, 1945  Today's Date: 03/20/2022 SLP Individual Time: 1015-1105 SLP Individual Time Calculation (min): 50 min  Short Term Goals: Week 1: SLP Short Term Goal 1 (Week 1): STG=LTG due to ELOS  Skilled Therapeutic Interventions:Skilled ST services focused on cognitive skills. SLP facilitated medication management for current medications utilizing BID pill organizer. Pt required supervision A verbal cues for verbal and functional problem solving. Pt initially required mod A verbal cues for recall within task, however after instruction in written aid required min A verbal cues. Pt demonstrated ability to write question for MD pertaining to medication with extra time. SLP recommends initiating memory notebook next session to aid in daily recall. Pt was left in room with call bell within reach and chair alarm set. SLP recommends to continue skilled services.     Pain Pain Assessment Pain Score: 0-No pain  Therapy/Group: Individual Therapy  Lively Haberman  [facility] 03/20/2022, 2:11 PM

## 2022-03-20 NOTE — Progress Notes (Signed)
PROGRESS NOTE   Subjective/Complaints:  No new complaints. Up in w/c.   ROS: Patient denies fever, rash, sore throat, blurred vision, dizziness, nausea, vomiting, diarrhea, cough, shortness of breath or chest pain, joint or back/neck pain, headache, or mood change.    Objective:   No results found. Recent Labs    03/17/22 1351 03/20/22 0623  WBC 11.9* 10.1  HGB 9.1* 9.1*  HCT 27.0* 28.1*  PLT 246 256   Recent Labs    03/17/22 1351 03/20/22 0623  NA  --  134*  K  --  3.9  CL  --  103  CO2  --  26  GLUCOSE  --  104*  BUN  --  27*  CREATININE 0.86 0.95  CALCIUM  --  8.4*    Intake/Output Summary (Last 24 hours) at 03/20/2022 1610 Last data filed at 03/20/2022 0801 Gross per 24 hour  Intake 480 ml  Output --  Net 480 ml        Physical Exam: Vital Signs Blood pressure 119/74, pulse 65, temperature 97.9 F (36.6 C), temperature source Oral, resp. rate 16, height 5\' 4"  (1.626 m), weight 63.4 kg, SpO2 100 %.   Constitutional: No distress . Vital signs reviewed. HEENT: NCAT, EOMI, oral membranes moist Neck: supple Cardiovascular: RRR without murmur. No JVD    Respiratory/Chest: CTA Bilaterally without wheezes or rales. Normal effort    GI/Abdomen: BS +, non-tender, non-distended Ext: no clubbing, cyanosis, or edema Psych: pleasant and cooperative  Skin: No evidence of breakdown, no evidence of rash Neurologic: Cranial nerves II through XII intact, motor strength is 5/5 in RIght and 4/5 left deltoid, bicep, tricep, grip, hip flexor, knee extensors, ankle dorsiflexor and plantar flexor--stable motor exam Sensory exam equal LT in BUE (diminished symmetrically) Musculoskeletal: Full range of motion in all 4 extremities. No joint swelling    Assessment/Plan: 1. Functional deficits which require 3+ hours per day of interdisciplinary therapy in a comprehensive inpatient rehab setting. Physiatrist is providing  close team supervision and 24 hour management of active medical problems listed below. Physiatrist and rehab team continue to assess barriers to discharge/monitor patient progress toward functional and medical goals  Care Tool:  Bathing    Body parts bathed by patient: Right arm, Left arm, Chest, Abdomen, Front perineal area, Buttocks, Right upper leg, Left upper leg, Right lower leg, Left lower leg, Face   Body parts bathed by helper: Left lower leg, Right lower leg Body parts n/a: Buttocks   Bathing assist Assist Level: Minimal Assistance - Patient > 75%     Upper Body Dressing/Undressing Upper body dressing   What is the patient wearing?: Pull over shirt    Upper body assist Assist Level: Minimal Assistance - Patient > 75%    Lower Body Dressing/Undressing Lower body dressing      What is the patient wearing?: Pants, Incontinence brief     Lower body assist Assist for lower body dressing: Moderate Assistance - Patient 50 - 74%     Toileting Toileting    Toileting assist Assist for toileting: Minimal Assistance - Patient > 75%     Transfers Chair/bed transfer  Transfers assist  Chair/bed transfer assist level: Minimal Assistance - Patient > 75%     Locomotion Ambulation   Ambulation assist      Assist level: Moderate Assistance - Patient 50 - 74% Assistive device: No Device Max distance: 150   Walk 10 feet activity   Assist     Assist level: Moderate Assistance - Patient - 50 - 74% Assistive device: No Device   Walk 50 feet activity   Assist    Assist level: Moderate Assistance - Patient - 50 - 74% Assistive device: No Device    Walk 150 feet activity   Assist    Assist level: Moderate Assistance - Patient - 50 - 74% Assistive device: No Device    Walk 10 feet on uneven surface  activity   Assist Walk 10 feet on uneven surfaces activity did not occur: Safety/medical concerns (balance deficits)          Wheelchair     Assist Is the patient using a wheelchair?: Yes Type of Wheelchair: Manual    Wheelchair assist level: Maximal Assistance - Patient 25 - 49% Max wheelchair distance: 50    Wheelchair 50 feet with 2 turns activity    Assist        Assist Level: Maximal Assistance - Patient 25 - 49%   Wheelchair 150 feet activity     Assist  Wheelchair 150 feet activity did not occur: Safety/medical concerns (L hand inattention)       Blood pressure 119/74, pulse 65, temperature 97.9 F (36.6 C), temperature source Oral, resp. rate 16, height 5\' 4"  (1.626 m), weight 63.4 kg, SpO2 100 %.  Medical Problem List and Plan: 1. 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  Status post right parietal craniotomy resection of brain tumor 03/11/2022.  Patient is to follow-up outpatient oncology services Dr.Gudena             -patient may shower cover incision             -ELOS/Goals: 7-10 days/ Sup with PT/OT/SLP   --Continue CIR therapies including PT, OT, and SLP   2.  Antithrombotics: -DVT/anticoagulation:  Pharmaceutical: Lovenox initiated 03/12/2022             -antiplatelet therapy: N/A 3. Pain Management: Hydrocodone as needed 4. Mood/Sleep: Provide emotional support             -antipsychotic agents: N/A 5. Neuropsych/cognition: This patient is capable of making decisions on her own behalf. 6. Skin/Wound Care: Routine skin checks,  7. Fluids/Electrolytes/Nutrition:  -BUN remains elevated. --continue to push po  -on no offending meds  8.  Seizure prophylaxis.  EEG negative.  Keppra 500 mg twice daily -no seizures since rehab admit 9.  Hyperlipidemia.  Crestor 10mg  10.  Hypothyroidism.  Synthroid 50 mcg dialy 11.  Anemia.  Continue iron supplement. Last HGB 9.1  Follow-up CBC 12.  History of tobacco use.  Provide counseling 13.  History of peripheral neuropathy.  Patient has been seen by Dr. Terrace Arabia in the past. 14.  Suspect carpal tunnel L wrist. She reports she may have been told this in the past.   - night time use wrist splint could be beneficial 15. Leukocytosis, no signs of infection at this time, likely due to prior Decadron, monitor  -6/26- wbc's down to 10k    LOS: 3 days A FACE TO FACE EVALUATION WAS PERFORMED  Ranelle Oyster 03/20/2022, 9:22 AM

## 2022-03-20 NOTE — Progress Notes (Signed)
Inpatient Rehabilitation Care Coordinator Assessment and Plan Patient Details  Name: Tabitha Ramirez MRN: [id number] Date of Birth: 1946/02/19  Today's Date: 03/20/2022  Hospital Problems: Principal Problem:   Brain tumor [facility])  Past Medical History:  Past Medical History:  Diagnosis Date   Allergy    Anemia    Arthritis    "joints" (09/29/2013)   Breast cancer (HCC)    GERD (gastroesophageal reflux disease)    Heart murmur    History of blood transfusion    "related to chemo/breast cancer" (09/29/2013)   Hypertension    Migraines    Personal history of chemotherapy    Personal history of radiation therapy    Radiation 11/05/13-12/24/13   Right breast    TMJ (temporomandibular joint syndrome)    "left; just dx'd" (09/29/2013   Past Surgical History:  Past Surgical History:  Procedure Laterality Date   AXILLARY LYMPH NODE BIOPSY Right 03/10/2013   Procedure: AXILLARY LYMPH NODE BIOPSY;  Surgeon: Ernestene Mention, MD;  Location: [facility] OR;  Service: General;  Laterality: Right;  sentinel node with blue dye   BREAST BIOPSY     BREAST LUMPECTOMY Right 09/29/2013   needle localization w/axillary LND/notes 09/29/2013   BREAST LUMPECTOMY WITH NEEDLE LOCALIZATION AND AXILLARY LYMPH NODE DISSECTION Right 09/29/2013   Procedure: RIGHT BREAST NEEDLE LOCALIZED LUMPECTOMY AND AXILLARY LYMPH NODE DISSECTION;  Surgeon: Ernestene Mention, MD;  Location: [facility] OR;  Service: General;  Laterality: Right;   COLONOSCOPY N/A 04/30/2013   Procedure: COLONOSCOPY;  Surgeon: Graylin Shiver, MD;  Location: [facility] ENDOSCOPY;  Service: Endoscopy;  Laterality: N/A;   CRANIOTOMY Right 03/11/2022   Procedure: CRANIOTOMY TUMOR EXCISION;  Surgeon: Venetia Night, MD;  Location: [facility] ORS;  Service: Neurosurgery;  Laterality: Right;   ESOPHAGOGASTRODUODENOSCOPY N/A 04/28/2013   Procedure: ESOPHAGOGASTRODUODENOSCOPY (EGD);  Surgeon: Graylin Shiver, MD;  Location: [facility];  Service: Endoscopy;  Laterality: N/A;   MASTECTOMY Right  09/29/2013   "partial"   PORT-A-CATH REMOVAL  09/29/2013   PORT-A-CATH REMOVAL Left 09/29/2013   Procedure: REMOVAL PORT-A-CATH;  Surgeon: Ernestene Mention, MD;  Location: [facility] OR;  Service: General;  Laterality: Left;   PORTACATH PLACEMENT N/A 03/10/2013   Procedure: INSERTION PORT-A-CATH WITH FLUOROSCOPY AND ULTRASOUND;  Surgeon: Ernestene Mention, MD;  Location: [facility] OR;  Service: General;  Laterality: N/A;   SURAL NERVE BX Right 05/13/2014   Procedure: SURAL NERVE BIOPSY;  Surgeon: Hewitt Shorts, MD;  Location: [facility] NEURO ORS;  Service: Neurosurgery;  Laterality: Right;  Right sural nerve biopsy   TONSILLECTOMY     TOTAL ABDOMINAL HYSTERECTOMY     With bilateral salpingo-oophorectomy   Social History:  reports that she has quit smoking. Her smoking use included cigarettes. She has never used smokeless tobacco. She reports that she does not drink alcohol and does not use drugs.  Family / Support Systems Marital Status: Divorced Patient Roles: Parent, Other (Comment), Caregiver (grandmother) Children: Michelle-daughter [phone]  Bradley Ferris [phone] Other Supports: son in-law Anticipated Caregiver: Daughter and son in-law Ability/Limitations of Caregiver: Both work but are in and out according to pt Caregiver Availability: 24/7 (short time 24/7) Family Dynamics: Close with both pof her children but lives with daughter and son in-law and 70 yo grandson whom she takes care of while his parents work.  Social History Preferred language: English Religion: Baptist Cultural Background: no issues Education: HS Health Literacy - How often do you need to have someone help you when you read instructions, pamphlets, or other written  material from your doctor or pharmacy?: Never Writes: Yes Employment Status: Retired Marine scientist Issues: No issues Guardian/Conservator: None-according to MD pt is capable of making her own decisions while here. Daughter also wants to be included    Abuse/Neglect Abuse/Neglect Assessment Can Be Completed: Yes Physical Abuse: Denies Verbal Abuse: Denies Sexual Abuse: Denies Exploitation of patient/patient's resources: Denies Self-Neglect: Denies  Patient response to: Social Isolation - How often do you feel lonely or isolated from those around you?: Never  Emotional Status Pt's affect, behavior and adjustment status: Pt is motivated to do well and feels she is already making progress. She is getting stronger and doing better than she thought she would. She has always been independent even with her health issues. She feels this is dejavu. Recent Psychosocial Issues: other health issues Psychiatric History: No issues very future oriented and positive. Has a strong faith also Substance Abuse History: No issues  Patient / Family Perceptions, Expectations & Goals Pt/Family understanding of illness & functional limitations: Pt is able to explain her surgery and reports awaiting results, she reports beng sent off and awaiting results. She does talk with the MD and feels her questions are being addressed. Daughter is very involved and comes in the evenings to see her Premorbid pt/family roles/activities: Mom, grandmother, caregiver, church member, etc Anticipated changes in roles/activities/participation: resume Pt/family expectations/goals: Pt states: " I plan to be fairly independent when I leave here."  [employer]: None Premorbid Home Care/DME Agencies: Other (Comment) (cane and rw) Transportation available at discharge: family Is the patient able to respond to transportation needs?: Yes In the past 12 months, has lack of transportation kept you from meetings, work, or from getting things needed for daily living?: No  Discharge Planning Living Arrangements: Children, Other relatives Support Systems: Children, Other relatives, Friends/neighbors, Church/faith community Type of Residence: Private  residence Insurance Resources: Media planner (specify) ([employer] Medicare) Financial Resources: Tree surgeon, Family Support Financial Screen Referred: No Living Expenses: Lives with family Money Management: Patient, Family Does the patient have any problems obtaining your medications?: No Home Management: Administrator, sports and daughter Patient/Family Preliminary Plans: Return home with daughter and son in-law who will work out to be there with her for a short time. Pt feels with them being in and out it would work out like before. Her grandson is at his other grandparents home since she is in the hospital. She feels good about her progress thus far. Care Coordinator Barriers to Discharge: Insurance for SNF coverage Care Coordinator Anticipated Follow Up Needs: HH/OP  Clinical Impression Pleasant patient who is very motivated to do well and regain her independence. She has been through health issues before and feels she can conquer them again. Her daughter and son in-law are very involved and will assist at discharge. Aware team conference tomorrow, Auria-primary social worker, will update them after conference. Pt is aware of this.  Lucy Chris 03/20/2022, 11:58 AM

## 2022-03-20 NOTE — Progress Notes (Signed)
Physical Therapy Session Note  Patient Details  Name: Tabitha Ramirez MRN: [id number] Date of Birth: Jul 19, 1946  Today's Date: 03/20/2022 PT Individual Time: 1300-1400 PT Individual Time Calculation (min): 60 min   Short Term Goals: Week 1:  PT Short Term Goal 1 (Week 1): = LTGS due to ELOS  Skilled Therapeutic Interventions/Progress Updates:    Patient in wheelchair in room finishing lunch.  Requesting to use the bathroom.  Sit to stand to RW minguard A.  Ambulated x 10' to bathroom with RW and minguard A.  Had some incontinence of urine and assisted to change brief and doff/don pants with mod A.  Patient ambulated to ortho gym 100' with RW and min to minguard A.  She performed Berg balance assessment as noted below scored 22/56.  Patient forward step ups x 10 with L first to 6" step with RW for UE support, then x 10 with R first.  Performed standing balance/cognitive task at [facility] with intermittent UE support 1 hand, cues for changing hands first tapping letters A-Z, then tapping number then letter with mod questioning cues throughout, first with 52 characters, but pt overwhelmed so switched to 26.  Patient ambulated to room with RW and min A for coordination/balance and walker safety.  Left up in recliner with alarm belt active and needs in reach.   Therapy Documentation Precautions:  Precautions Precautions: Fall Precaution Comments: L field cut Restrictions Weight Bearing Restrictions: No  Pain: Pain Assessment Pain Score: 0-No pain  Balance: Standardized Balance Assessment Standardized Balance Assessment: Berg Balance Test Berg Balance Test Sit to Stand: Needs minimal aid to stand or to stabilize Standing Unsupported: Able to stand 2 minutes with supervision Sitting with Back Unsupported but Feet Supported on Floor or Stool: Able to sit safely and securely 2 minutes Stand to Sit: Controls descent by using hands Transfers: Needs one person to assist Standing Unsupported with  Eyes Closed: Able to stand 10 seconds with supervision Standing Ubsupported with Feet Together: Needs help to attain position and unable to hold for 15 seconds From Standing, Reach Forward with Outstretched Arm: Reaches forward but needs supervision From Standing Position, Pick up Object from Floor: Able to pick up shoe, needs supervision From Standing Position, Turn to Look Behind Over each Shoulder: Needs assist to keep from losing balance and falling Turn 360 Degrees: Needs assistance while turning Standing Unsupported, Alternately Place Feet on Step/Stool: Needs assistance to keep from falling or unable to try Standing Unsupported, One Foot in Front: Able to take small step independently and hold 30 seconds Standing on One Leg: Tries to lift leg/unable to hold 3 seconds but remains standing independently Total Score: 22   Therapy/Group: Individual Therapy  Elray Mcgregor Headrick, PT 03/20/2022, 1:32 PM

## 2022-03-21 DIAGNOSIS — D72823 Leukemoid reaction: Secondary | ICD-10-CM | POA: Diagnosis not present

## 2022-03-21 DIAGNOSIS — R7989 Other specified abnormal findings of blood chemistry: Secondary | ICD-10-CM | POA: Diagnosis not present

## 2022-03-21 DIAGNOSIS — D496 Neoplasm of unspecified behavior of brain: Secondary | ICD-10-CM | POA: Diagnosis not present

## 2022-03-21 NOTE — Progress Notes (Addendum)
Patient ID: Tabitha Ramirez, female   DOB: [birth year], 76 y.o.   MRN: [id number]  SW met with pt in room to provide updates from team conference, and d/c date 7/3. Pt aware SW will follow-up with her dtr.   1320-SW spoke with pt dtr [name] to introduce self, explain role, discuss discharge process, and give updates/discharge date. SW confirmed d/c is 7/3. SW informed will continue to provide updates as available. Preferred outpatient location is [facility]. Dtr reports she is adjusting her job to make accommodations to be available to her mother. Dt reports mother has RW.   SW faxed outpatient PT/OT/SLP order to [facility] (p:[phone]/f:[phone]).   Cecile Sheerer, MSW, LCSWA Office: [phone] Cell: [phone] Fax: [phone]

## 2022-03-21 NOTE — Patient Care Conference (Signed)
Inpatient RehabilitationTeam Conference and Plan of Care Update Date: 03/21/2022   Time: 10:31 AM    Patient Name: Tabitha Ramirez      Medical Record Number: [id number]  Date of Birth: 1946/01/30 Sex: Female         Room/Bed: 4M05C/4M05C-01 Payor Info: Payor: [employer] MEDICARE / Plan: [employer] MEDICARE CHOICE PPO / Product Type: *No Product type* /    Admit Date/Time:  03/17/2022 12:52 PM  Primary Diagnosis:  Brain tumor [facility])  Hospital Problems: Principal Problem:   Brain tumor [facility])    Expected Discharge Date: Expected Discharge Date: 03/27/22  Team Members Present: Physician leading conference: Dr. Alger Simons Social Worker Present: Loralee Pacas, [place] Nurse Present: Dorthula Nettles, RN PT Present: Tereasa Coop, PT OT Present: Cherylynn Ridges, OT SLP Present: Sherren Kerns, SLP PPS Coordinator present : Ileana Ladd, PT     Current Status/Progress Goal Weekly Team Focus  Bowel/Bladder   incontinent bladder due to urgency, continent bowel  regain continence  time toilet q 2 hr   Swallow/Nutrition/ Hydration             ADL's   Min A transfers and BADL tasks  Supervision  L NMR, transfers, balance, activity tolerance   Mobility   mina overall, ambulating 100' with RW  supervision  NMR, balance, ambulation, DC prep, family ed   Communication             Safety/Cognition/ Behavioral Observations  Supervision A problem solving, mod-min A recall  Mod I  initate memory notebook, money management (higher level problem solving)   Pain   no reported pain  < 3  assess pain q 4 hr and prn   Skin   head incision with sutures  no breakdown  assess skin q shift and prn     Discharge Planning:  Return home with daughter and son in-law who will work out to be there with her for a short time. SW will confirm no  barriers to discharge.   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 Please push fluids. Has peripheral neuropathy. Bladder urgency, continent  bowel, no reported pain. Head incision with sutures, no s/s infection. More confused at night/early morning when woken up by staff. Discharging home with daughter and SIL.   Patient on target to meet rehab goals: yes, supervision to mod I goals. Currently min assist ADL's, transfers. Slow to initiate. Left side over reaches. Min assist 100 ft with RW. Mod assist with hand held assist. Supervision problem solving. Started Social worker.  *See Care Plan and progress notes for long and short-term goals.   Revisions to Treatment Plan:  MD adjusting medications   Teaching Needs: Family education, bladder management, medication management, skin/wound care, transfer/gait training, etc.   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 prerenal azotemia. keppra for sz prophylaxis  Barriers to Discharge: Medical stability   Possible Resolutions to [employer] Focus: daily assessment of labs and pt data, pushing fluids   Continued Need for Acute Rehabilitation Level of Care: The patient requires daily medical management by a physician with specialized training in physical medicine and rehabilitation for the following reasons: Direction of a multidisciplinary physical rehabilitation program to maximize functional independence : Yes Medical management of patient stability for increased activity during participation in  an intensive rehabilitation regime.: Yes Analysis of laboratory values and/or radiology reports with any subsequent need for medication adjustment and/or medical intervention. : Yes   I attest that I was present, lead the team conference, and concur with the assessment and plan of the team.   Cristi Loron 03/21/2022, 2:37 PM

## 2022-03-22 ENCOUNTER — Ambulatory Visit: Payer: Medicare PPO | Admitting: Hematology and Oncology

## 2022-03-22 ENCOUNTER — Other Ambulatory Visit: Payer: Medicare PPO

## 2022-03-22 DIAGNOSIS — D72823 Leukemoid reaction: Secondary | ICD-10-CM | POA: Diagnosis not present

## 2022-03-22 DIAGNOSIS — R7989 Other specified abnormal findings of blood chemistry: Secondary | ICD-10-CM | POA: Diagnosis not present

## 2022-03-22 DIAGNOSIS — D496 Neoplasm of unspecified behavior of brain: Secondary | ICD-10-CM | POA: Diagnosis not present

## 2022-03-22 LAB — SURGICAL PATHOLOGY

## 2022-03-22 NOTE — Progress Notes (Signed)
Physical Therapy Session Note  Patient Details  Name: Tabitha Ramirez MRN: [id number] Date of Birth: May 03, 1946  Today's Date: 03/22/2022 PT Individual Time: 1000-1030 PT Individual Time Calculation (min): 30 min   Short Term Goals: Week 1:  PT Short Term Goal 1 (Week 1): = LTGS due to ELOS  Skilled Therapeutic Interventions/Progress Updates:    Pt received seated in bed, agreeable to PT session. No complaints of pain. Seated in bed to sitting EOB with Supervision with increased time needed to complete. Pt reports urge to toilet. Sit to stand with CGA to RW. Ambulatory transfer to bathroom with RW and CGA with mod cueing needed for safety, RW management, and attention to L visual field. Toilet transfer with CGA and RW, assist needed for clothing management and setup A for pericare. Pt taken to therapy gym via w/c for time and energy conservation. Ambulation with RW weaving through cones with RW and mod cueing progressing to min cueing for obstacle avoidance and scanning to the L. Sit to stand and stand to sit with turns on various surfaces in therapy gym with RW and CGA initially with max cueing required to safely back up to chairs to sit, improving to mod cueing. Pt left seated in w/c in room with needs in reach, quick release belt and chair alarm in place at end of session.  Therapy Documentation Precautions:  Precautions Precautions: Fall Precaution Comments: L field cut Restrictions Weight Bearing Restrictions: No       Therapy/Group: Individual Therapy   Excell Seltzer, PT, DPT, CSRS 03/22/2022, 12:38 PM

## 2022-03-22 NOTE — Progress Notes (Signed)
Speech Language Pathology Daily Session Note  Patient Details  Name: Tabitha Ramirez MRN: [id number] Date of Birth: October 17, 1945  Today's Date: 03/22/2022 SLP Individual Time: 2094-7096 SLP Individual Time Calculation (min): 45 min  Short Term Goals: Week 1: SLP Short Term Goal 1 (Week 1): STG=LTG due to ELOS  Skilled Therapeutic Interventions: Skilled ST treatment focused on cognitive goals. SLP and pt completing medication management tasks to increase awareness of current medication regime as well as pillbox organization. Pt loaded medications into a twice daily pill box with x3 verbal cues for use of organizational strategies to minimize risk for error. Pt identified errors x3 with sup A and self corrected with 100% accuracy. Task required extended time and appeared challenging from a physical/fine motor standpoint. SLP utilized various adaptations to increase efficiency (flip top medicine caps, push top pillbox, small bowl to place pills in). Pt would benefit from assistance at discharge from a physical and cognitive standpoint for medication management to ensure safety and accuracy. Pt verbalized understanding and stated her daughter would be able to assist. Patient was left in bed with alarm activated and immediate needs within reach at end of session. Continue per current plan of care.      Pain Pain Assessment Pain Scale: 0-10 Pain Score: 0-No pain  Therapy/Group: Individual Therapy  Patty Sermons 03/22/2022, 8:56 AM

## 2022-03-22 NOTE — Progress Notes (Signed)
Occupational Therapy Session Note  Patient Details  Name: TIFFAY PINETTE MRN: [id number] Date of Birth: 01-13-1946  Today's Date: 03/22/2022 OT Individual Time: 1345-1425 OT Individual Time Calculation (min): 40 min    Short Term Goals: Week 1:  OT Short Term Goal 1 (Week 1): STGs=LTGs  Skilled Therapeutic Interventions/Progress Updates:    Pt resting in w/c upon arrival. Pt required mod verbal cues and use of memory notebook to recall earlier therapies. Focus on LUE use and scanning to Lt to locate items and complete tasks. Picked up several small foam cubes (placed in Lt visual field) in LUE and placed them through hole in cup. Pt dropped 6 cues during task. Pt also completed moderately complex pattern using colored pegs. Pt required verbal cues to scan to her left to include the left side of pattern. Pt used BUE to complete task. Pt remained in w/c with belt alarm activated and all needs within reach.   Therapy Documentation Precautions:  Precautions Precautions: Fall Precaution Comments: L field cut Restrictions Weight Bearing Restrictions: No Pain:  Pt denies pain this afternoon    Therapy/Group: Individual Therapy  Leroy Libman 03/22/2022, 2:26 PM

## 2022-03-22 NOTE — Progress Notes (Signed)
PROGRESS NOTE   Subjective/Complaints:  No issues over night.   ROS: Patient denies fever, rash, sore throat, blurred vision, dizziness, nausea, vomiting, diarrhea, cough, shortness of breath or chest pain, joint or back/neck pain, headache, or mood change.    Objective:   No results found. Recent Labs    03/20/22 0623  WBC 10.1  HGB 9.1*  HCT 28.1*  PLT 256   Recent Labs    03/20/22 0623  NA 134*  K 3.9  CL 103  CO2 26  GLUCOSE 104*  BUN 27*  CREATININE 0.95  CALCIUM 8.4*    Intake/Output Summary (Last 24 hours) at 03/22/2022 0849 Last data filed at 03/22/2022 0826 Gross per 24 hour  Intake 477 ml  Output --  Net 477 ml        Physical Exam: Vital Signs Blood pressure 114/74, pulse 70, temperature 98.4 F (36.9 C), resp. rate 16, height '5\' 4"'$  (1.626 m), weight 63.4 kg, SpO2 100 %.   Constitutional: No distress . Vital signs reviewed. HEENT: NCAT, EOMI, oral membranes moist Neck: supple Cardiovascular: RRR without murmur. No JVD    Respiratory/Chest: CTA Bilaterally without wheezes or rales. Normal effort    GI/Abdomen: BS +, non-tender, non-distended Ext: no clubbing, cyanosis, or edema Psych: pleasant and cooperative  Skin: No evidence of breakdown, no evidence of rash. Surgical site CDI Neurologic: Cranial nerves II through XII intact, motor strength is 5/5 in RIght and 4/5 left deltoid, bicep, tricep, grip, hip flexor, knee extensors, ankle dorsiflexor and plantar flexor--stable motor exam Sensory exam equal LT in BUE  Musculoskeletal: Full range of motion in all 4 extremities. No joint swelling    Assessment/Plan: 1. Functional deficits which require 3+ hours per day of interdisciplinary therapy in a comprehensive inpatient rehab setting. Physiatrist is providing close team supervision and 24 hour management of active medical problems listed below. Physiatrist and rehab team continue to  assess barriers to discharge/monitor patient progress toward functional and medical goals  Care Tool:  Bathing    Body parts bathed by patient: Right arm, Left arm, Chest, Abdomen, Front perineal area, Buttocks, Right upper leg, Left upper leg, Right lower leg, Left lower leg, Face   Body parts bathed by helper: Left lower leg, Right lower leg Body parts n/a: Buttocks   Bathing assist Assist Level: Minimal Assistance - Patient > 75%     Upper Body Dressing/Undressing Upper body dressing   What is the patient wearing?: Pull over shirt    Upper body assist Assist Level: Minimal Assistance - Patient > 75%    Lower Body Dressing/Undressing Lower body dressing      What is the patient wearing?: Pants, Incontinence brief     Lower body assist Assist for lower body dressing: Moderate Assistance - Patient 50 - 74%     Toileting Toileting    Toileting assist Assist for toileting: Minimal Assistance - Patient > 75%     Transfers Chair/bed transfer  Transfers assist     Chair/bed transfer assist level: Minimal Assistance - Patient > 75%     Locomotion Ambulation   Ambulation assist      Assist level: Minimal Assistance - Patient >  75% Assistive device: Walker-rolling Max distance: 100'   Walk 10 feet activity   Assist     Assist level: Minimal Assistance - Patient > 75% Assistive device: Walker-rolling   Walk 50 feet activity   Assist    Assist level: Minimal Assistance - Patient > 75% Assistive device: Walker-rolling    Walk 150 feet activity   Assist    Assist level: Moderate Assistance - Patient - 50 - 74% Assistive device: No Device    Walk 10 feet on uneven surface  activity   Assist Walk 10 feet on uneven surfaces activity did not occur: Safety/medical concerns (balance deficits)         Wheelchair     Assist Is the patient using a wheelchair?: Yes Type of Wheelchair: Manual    Wheelchair assist level: Maximal  Assistance - Patient 25 - 49% Max wheelchair distance: 50    Wheelchair 50 feet with 2 turns activity    Assist        Assist Level: Maximal Assistance - Patient 25 - 49%   Wheelchair 150 feet activity     Assist  Wheelchair 150 feet activity did not occur:  (L hand inattention)   Assist Level: Maximal Assistance - Patient 25 - 49% (per PT evaluation Documentation)   Blood pressure 114/74, pulse 70, temperature 98.4 F (36.9 C), resp. rate 16, height '5\' 4"'$  (1.626 m), weight 63.4 kg, SpO2 100 %.  Medical Problem List and Plan: 1. 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  Status post right parietal craniotomy resection of brain tumor 03/11/2022.  Patient is to follow-up outpatient oncology services Dr.Gudena             -patient may shower cover incision             -ELOS/Goals: 7-10 days/ Sup with PT/OT/SLP   -Continue CIR therapies including PT, OT, and SLP  2.  Antithrombotics: -DVT/anticoagulation:  Pharmaceutical: Lovenox initiated 03/12/2022             -antiplatelet therapy: N/A 3. Pain Management: Hydrocodone as needed 4. Mood/Sleep: Provide emotional support             -antipsychotic agents: N/A 5. Neuropsych/cognition: This patient is capable of making decisions on her own behalf. 6. Skin/Wound Care: Routine skin checks,  7. Fluids/Electrolytes/Nutrition:  -BUN remains elevated. --have encouraged fluids  -on no offending meds  8.  Seizure prophylaxis.  EEG negative although questionable seizure at time of acute admission -continue Keppra 500 mg twice daily -no seizures since rehab admit 9.  Hyperlipidemia.  Crestor '10mg'$  10.  Hypothyroidism.  Synthroid 50 mcg dialy 11.  Anemia.  Continue iron supplement. Last HGB 9.1  Follow-up CBC 12.  History of tobacco use.  Provide counseling 13.  History of peripheral neuropathy.  Patient has been seen by Dr. Krista Blue in the past. 14. ? carpal tunnel L wrist. She  reports she may have been told this in the past.   - night time use wrist splint could be beneficial--hold off at present 15. Leukocytosis, no signs of infection at this time, likely due to prior Decadron, monitor  -6/26- wbc's down to 10k    LOS: 5 days A FACE TO FACE EVALUATION WAS PERFORMED  [name] 03/22/2022, 8:49 AM

## 2022-03-22 NOTE — Progress Notes (Signed)
Physical Therapy Session Note  Patient Details  Name: Tabitha Ramirez MRN: [id number] Date of Birth: 1945-09-26  Today's Date: 03/22/2022 PT Individual Time: 1102-1200 and 1884-1660 PT Individual Time Calculation (min): 58 min and 25 min  Short Term Goals: Week 1:  PT Short Term Goal 1 (Week 1): = LTGS due to ELOS  Skilled Therapeutic Interventions/Progress Updates:     Pt received seated in [facility] and agrees to therapy. No complaint of pain. WC transport to gym for time management. PT demonstrates sequencing of transfer to Nustep and then pt completes with CGA and reminder of hand placement. Pt completes Nustep for L hemibody NMR as well as strength and endurance training. Pt cued to perform x12:00 at workload of 5 with average steps per minute>50. Pt requires x7-8 brief rest breaks during activity.   Pt performs stand step transfer to mat table with CGA and cues for sequencing. Pt then practices repeated sit to stand transfers to work on optimal sequencing and body mechanics. Pt noted to use backs of legs for support and keeps weight posterior to COG for majority of transfer. PT provides education and demonstration of anterior weight shift for safer and more efficient mobility. Pt able to improve weight shift when transitioning from sit to stand but has difficulty keeping weight forward when going from stand to sit. Pt says that she feels that neuropathy in feet makes desired weight distribution more difficult. PT encourages pt to have family members bring in shoes for increased comfort and functionality.   Pt ambulates x390' with RW and CGA, with cues to increase proximity to RW for safety, and cues for safe AD management when performing turns and navigating around obstacles. Pt left seated in WC with alarm intact and all needs within reach.  2nd Session: Pt received seated in [facility] and agrees to therapy. No complaint of pain. Pt ambulates to toilet without AD and with modA from therapist due to Jarrell  when sequencing transition on toilet. Following, pt utilizes RW for stability, ambulating 680' with CGA and cues for upright gaze to improve posture and balance, increasing proximity to RW for safety, and increasing stride length and gait speed to decrease risk for falls. Pt left seated in WC with alarm intact and all needs iwthin reach.  Therapy Documentation Precautions:  Precautions Precautions: Fall Precaution Comments: L field cut Restrictions Weight Bearing Restrictions: No    Therapy/Group: Individual Therapy  Breck Coons, PT, DPT 03/22/2022, 4:56 PM

## 2022-03-22 NOTE — Progress Notes (Signed)
Patient ID: Tabitha Ramirez, female   DOB: Mar 01, 1946, 76 y.o.   MRN: [id number]  SW received phone call from pt dtr [name] reporting she can be in for family edu tomorrow, 1pm-4pm.   *Confirmed with scheduling family edu. SW updated pt dtr [name] on above.   Loralee Pacas, MSW, [place] Office: [phone] Cell: [phone] Fax: [phone]

## 2022-03-23 DIAGNOSIS — D72823 Leukemoid reaction: Secondary | ICD-10-CM | POA: Diagnosis not present

## 2022-03-23 DIAGNOSIS — R7989 Other specified abnormal findings of blood chemistry: Secondary | ICD-10-CM | POA: Diagnosis not present

## 2022-03-23 DIAGNOSIS — D496 Neoplasm of unspecified behavior of brain: Secondary | ICD-10-CM | POA: Diagnosis not present

## 2022-03-23 NOTE — Progress Notes (Signed)
Physical Therapy Session Note  Patient Details  Name: Tabitha Ramirez MRN: [id number] Date of Birth: 1946-05-13  Today's Date: 03/23/2022 PT Individual Time: 9163-8466 and 1501-1555 PT Individual Time Calculation (min): 30 min and 54 min  Short Term Goals: Week 1:  PT Short Term Goal 1 (Week 1): = LTGS due to ELOS  Skilled Therapeutic Interventions/Progress Updates:     1st Session: Pt received supine in bed and agrees to therapy. No complaint of pain. Supine to sit with verbal cues for sequencing and positioning. Pt performs sit to stand with CGA/minA to facilitate anterior weight shift. Pt ambulates x500' with CGA and no AD, with cues for upright gaze to improve posture and balance, increasing gait speed to decreased risk for falls, and navigation due to pt having difficulty motor planning avoiding obstacles in path.  Pt performs block training of transfer from mat to chair. Pt has difficulty with motor planning and sequencing of transfer, often facing chair that she is transferring to and having to turn 180 degrees to safely sit in chair. Pt has x1 instance of requiring modA due to attempting to sit when buttocks is perpendicular to sitting surface. PT provides demonstration of transfer and max verbal cues for sequencing and pt able to perform several times with CGA. Pt ambulates back to room without AD and with CGA. Left seated with alarm intact and all needs within reach.  2nd Session: Pt received seated in [facility] and agrees to therapy. Daughter present for family education. Pt performs sit to stand with cues for sequencing and CGA. Pt ambulates with RW x300' with cues for proximity to RW for safety, and maintaining upright gaze to improve posture and balance. Pt performs repeated mat to chair transfers with CGA/minA with cues for sequencing and hand placement. Pt then performs repeated sit to stand transfers. Completed to improve motor planning and sequencing of transfer, as pt tends to keep weight  too far posteriorly and relies on leaning against mat or chair to gain balance in standing. Pt has difficulty making corrections to motor pattern, so PT has pt reach forward for clothespin placed on ground 2 feet in front of patient, forcing anterior weight shift, then standing directly up from attained position. With repeated trials, pt is able to complete with CGA, but has tendency to revert to prior pattern when not cued. Pt ambulates x300' with no AD, with CGA and cues for upright gaze and increasing gait speed and stride length to decrease risk for falls. Pt completes car transfer x2 following PT demonstration, requiring CGA and cues to not hold onto car door to prevent swinging and LOB. Pt ambulates back to room. Left seated with alarm intact and all needs within reach.  Therapy Documentation Precautions:  Precautions Precautions: Fall Precaution Comments: L field cut Restrictions Weight Bearing Restrictions: No    Therapy/Group: Individual Therapy  Breck Coons, PT, DPT 03/23/2022, 4:50 PM

## 2022-03-23 NOTE — Progress Notes (Signed)
Occupational Therapy Session Note  Patient Details  Name: Tabitha Ramirez MRN: [id number] Date of Birth: Aug 25, 1946  Today's Date: 03/23/2022 OT Individual Time: 1300-1400 OT Individual Time Calculation (min): 60 min    Short Term Goals: Week 1:  OT Short Term Goal 1 (Week 1): STGs=LTGs  Skilled Therapeutic Interventions/Progress Updates:    Pt greeted seated in wc with daughter present for family education. Pt reported need to go to the bathroom. Pt needed verbal cues for hand placement with sit<>stands and cues for body awareness when ambulating inside RW. OT educated daughter on providing appropriate cues for attention to L hemi body, safety, and surroundings on L side. Pt with successful BM and voided bladder. She completed hygiene in standing with set-up A. Bathing completed from shower seat with verbal cues and min A when turning to enter walk-in shower. Pt integrating L UE more automatically within BADL tasks today. CGA when standing to wash buttocks. Dressing tasks completed from wc with cues to recall hemi dressing techniques and min A for orienting clothing. Pt then ambulated to therapy apartment and practiced tub bench transfer as well as stepping over tub and sitting on shower seat as simulated for home environment. Daughter provided CGA for transfer in and out of shower with cues for foot placement and safety. OT issued home fine motor program and educated pt and daughter. Pt handoff to SLP for  next therapy session.   Therapy Documentation Precautions:  Precautions Precautions: Fall Precaution Comments: L field cut Restrictions Weight Bearing Restrictions: No Pain:  Denies pain   Therapy/Group: Individual Therapy  Valma Cava 03/23/2022, 2:07 PM

## 2022-03-23 NOTE — Progress Notes (Signed)
PROGRESS NOTE   Subjective/Complaints:  Pt without complaints. Seems to have slept well. Denied pain  ROS: Patient denies fever, rash, sore throat, blurred vision, dizziness, nausea, vomiting, diarrhea, cough, shortness of breath or chest pain, joint or back/neck pain, headache, or mood change.    Objective:   No results found. No results for input(s): "WBC", "HGB", "HCT", "PLT" in the last 72 hours.  No results for input(s): "NA", "K", "CL", "CO2", "GLUCOSE", "BUN", "CREATININE", "CALCIUM" in the last 72 hours.   Intake/Output Summary (Last 24 hours) at 03/23/2022 1029 Last data filed at 03/23/2022 0700 Gross per 24 hour  Intake 891 ml  Output --  Net 891 ml        Physical Exam: Vital Signs Blood pressure 129/68, pulse 65, temperature 98.4 F (36.9 C), temperature source Oral, resp. rate 17, height '5\' 4"'$  (1.626 m), weight 63.4 kg, SpO2 100 %.   Constitutional: No distress . Vital signs reviewed. HEENT: NCAT, EOMI, oral membranes moist Neck: supple Cardiovascular: RRR without murmur. No JVD    Respiratory/Chest: CTA Bilaterally without wheezes or rales. Normal effort    GI/Abdomen: BS +, non-tender, non-distended Ext: no clubbing, cyanosis, or edema Psych: pleasant and cooperative  Skin: No evidence of breakdown, no evidence of rash. Surgical site CDI. A tag of suture is exposed. Neurologic: Cranial nerves II through XII intact, motor strength is 5/5 in RIght and 4/5 left deltoid, bicep, tricep, grip, hip flexor, knee extensors, ankle dorsiflexor and plantar flexor--stable motor exam Sensory exam equal LT in BUE  Musculoskeletal: Full range of motion in all 4 extremities. No joint swelling    Assessment/Plan: 1. Functional deficits which require 3+ hours per day of interdisciplinary therapy in a comprehensive inpatient rehab setting. Physiatrist is providing close team supervision and 24 hour management of  active medical problems listed below. Physiatrist and rehab team continue to assess barriers to discharge/monitor patient progress toward functional and medical goals  Care Tool:  Bathing    Body parts bathed by patient: Right arm, Left arm, Chest, Abdomen, Front perineal area, Buttocks, Right upper leg, Left upper leg, Right lower leg, Left lower leg, Face   Body parts bathed by helper: Left lower leg, Right lower leg Body parts n/a: Buttocks   Bathing assist Assist Level: Minimal Assistance - Patient > 75%     Upper Body Dressing/Undressing Upper body dressing   What is the patient wearing?: Pull over shirt    Upper body assist Assist Level: Minimal Assistance - Patient > 75%    Lower Body Dressing/Undressing Lower body dressing      What is the patient wearing?: Pants, Incontinence brief     Lower body assist Assist for lower body dressing: Moderate Assistance - Patient 50 - 74%     Toileting Toileting    Toileting assist Assist for toileting: Minimal Assistance - Patient > 75%     Transfers Chair/bed transfer  Transfers assist     Chair/bed transfer assist level: Minimal Assistance - Patient > 75%     Locomotion Ambulation   Ambulation assist      Assist level: Minimal Assistance - Patient > 75% Assistive device: Walker-rolling Max distance: 100'  Walk 10 feet activity   Assist     Assist level: Minimal Assistance - Patient > 75% Assistive device: Walker-rolling   Walk 50 feet activity   Assist    Assist level: Minimal Assistance - Patient > 75% Assistive device: Walker-rolling    Walk 150 feet activity   Assist    Assist level: Moderate Assistance - Patient - 50 - 74% Assistive device: No Device    Walk 10 feet on uneven surface  activity   Assist Walk 10 feet on uneven surfaces activity did not occur: Safety/medical concerns (balance deficits)         Wheelchair     Assist Is the patient using a wheelchair?:  Yes Type of Wheelchair: Manual    Wheelchair assist level: Maximal Assistance - Patient 25 - 49% Max wheelchair distance: 50    Wheelchair 50 feet with 2 turns activity    Assist        Assist Level: Maximal Assistance - Patient 25 - 49%   Wheelchair 150 feet activity     Assist  Wheelchair 150 feet activity did not occur:  (L hand inattention)   Assist Level: Maximal Assistance - Patient 25 - 49% (per PT evaluation Documentation)   Blood pressure 129/68, pulse 65, temperature 98.4 F (36.9 C), temperature source Oral, resp. rate 17, height '5\' 4"'$  (1.626 m), weight 63.4 kg, SpO2 100 %.  Medical Problem List and Plan: 1. 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  Status post right parietal craniotomy resection of brain tumor 03/11/2022.  Patient is to follow-up outpatient oncology services Dr.Gudena             -patient may shower cover incision             -ELOS/Goals: 7/3/2-- Sup with PT/OT/SLP   -Continue CIR therapies including PT, OT, and SLP  2.  Antithrombotics: -DVT/anticoagulation:  Pharmaceutical: Lovenox initiated 03/12/2022             -antiplatelet therapy: N/A 3. Pain Management: Hydrocodone as needed 4. Mood/Sleep: Provide emotional support             -antipsychotic agents: N/A 5. Neuropsych/cognition: This patient is capable of making decisions on her own behalf. 6. Skin/Wound Care: Routine skin checks,  7. Fluids/Electrolytes/Nutrition:  -BUN remains elevated. --have encouraged fluids  -on no offending meds  -recheck bmet 6/30  8.  Seizure prophylaxis.  EEG negative although questionable seizure at time of acute admission -continue Keppra 500 mg twice daily -no seizures since rehab admit--continue at discharge 9.  Hyperlipidemia.  Crestor '10mg'$  10.  Hypothyroidism.  Synthroid 50 mcg dialy 11.  Anemia.  Continue iron supplement. Last HGB 9.1  Follow-up CBC 12.  History of tobacco use.  Provide  counseling 13.  History of peripheral neuropathy.  Patient has been seen by Dr. Krista Blue in the past. 14. ? carpal tunnel L wrist. She reports she may have been told this in the past.   - night time use wrist splint could be beneficial--hold off at present 15. Leukocytosis, no signs of infection at this time, likely due to prior Decadron, monitor  -6/26- wbc's down to 10k, WNL    LOS: 6 days A FACE TO [place] T Vaden Becherer 03/23/2022, 10:29 AM

## 2022-03-23 NOTE — Progress Notes (Addendum)
Speech Language Pathology Daily Session Note  Patient Details  Name: Tabitha Ramirez MRN: [id number] Date of Birth: 10-Sep-1946  Today's Date: 03/23/2022 SLP Individual Time: 7185-5015 SLP Individual Time Calculation (min): 56 min  Short Term Goals: Week 1: SLP Short Term Goal 1 (Week 1): STG=LTG due to ELOS  Skilled Therapeutic Interventions: S: Pt seen this date for skilled ST intervention targeting cognitive-linguistic goals outlined above. Pt received awake/alert and OOB in w/c. Daughter present for family education. No c/o pain. Agreeable to ST intervention in hospital room. Pleasant and cooperative throughout.   O: Pt completed semi-complex problem-solving tasks with 100% accuracy given Max A multimodal cues and Mod-Max A to utilize calculator to calculate costs of given items on [employer]. Max A to participate in functional, hypothetical divergent reasoning tasks. Mod I for recall of items she ate for lunch and to recall tasks completed in OT and ST sessions. Wrote in [employer] with Sup A for attention to L side of paper and correcting spelling errors. Pt and family education completed re: pt's current deficits, cueing hierarchy, recs for assistance and supervision with all iADL tasks at time of discharge, ST POC, use of memory notebook, and ongoing ST follow-up after CIR discharge. Pt and daughter receptive to education and all questions answered.    A: Pt remains stimulable for skilled ST intervention targeting cognitive-linguistic impairments outlined in care plan. Recommend 24/7 supervision and assistance with all iADL tasks upon discharge, in addition to ongoing ST intervention. Pt and pt's sister amenable to recommendations.    P: Pt left in w/c with all safety measures activated. Daughter and NT present. Call bell reviewed and within reach and all immediate needs met. Continue per current ST POC.  Pain Denies pain; NAD  Therapy/Group: Individual Therapy  Charnel Giles  A Kenai Fluegel 03/23/2022, 3:30 PM

## 2022-03-24 DIAGNOSIS — R7989 Other specified abnormal findings of blood chemistry: Secondary | ICD-10-CM | POA: Diagnosis not present

## 2022-03-24 DIAGNOSIS — D72823 Leukemoid reaction: Secondary | ICD-10-CM | POA: Diagnosis not present

## 2022-03-24 DIAGNOSIS — D496 Neoplasm of unspecified behavior of brain: Secondary | ICD-10-CM | POA: Diagnosis not present

## 2022-03-24 LAB — BASIC METABOLIC PANEL
Anion gap: 11 (ref 5–15)
BUN: 21 mg/dL (ref 8–23)
CO2: 24 mmol/L (ref 22–32)
Calcium: 8.8 mg/dL — ABNORMAL LOW (ref 8.9–10.3)
Chloride: 103 mmol/L (ref 98–111)
Creatinine, Ser: 0.85 mg/dL (ref 0.44–1.00)
GFR, Estimated: >60 mL/min (ref >60)
Glucose, Bld: 97 mg/dL (ref 70–99)
Potassium: 3.9 mmol/L (ref 3.5–5.1)
Sodium: 138 mmol/L (ref 135–145)

## 2022-03-24 NOTE — Discharge Summary (Signed)
Physician Discharge Summary  Patient ID: Tabitha Ramirez MRN: [id number] DOB/AGE: March 04, 1946 76 y.o.  Admit date: 03/17/2022 Discharge date: 03/27/2022  Discharge Diagnoses:  Principal Problem:   Brain tumor [facility]) DVT prophylaxis Seizure prophylaxis Hyperlipidemia Hypothyroidism Anemia History of tobacco use Peripheral neuropathy History of breast cancer  Discharged Condition: Stable  Significant Diagnostic Studies: MR BRAIN W WO CONTRAST  Result Date: 03/12/2022 CLINICAL DATA:  Follow-up examination status post craniotomy for tumor resection. EXAM: MRI HEAD WITHOUT AND WITH CONTRAST TECHNIQUE: Multiplanar, multiecho pulse sequences of the brain and surrounding structures were obtained without and with intravenous contrast. CONTRAST:  52m GADAVIST GADOBUTROL 1 MMOL/ML IV SOLN COMPARISON:  Recent MRI from 03/09/2022. FINDINGS: Brain: Postoperative changes from interval right parietal craniotomy for tumor resection are seen. Postoperative blood products with a probable small focus of pneumocephalus present within the resection cavity. Previously seen enhancing mass has been resected. Few irregular foci of enhancement at the inferior aspect of the resection cavity favored to be postoperative and/or vascular in nature (series 18, image 100). There has been gross total resection of the tumor, with no definite residual tumor seen on this immediate postoperative examination. Approximate 2 cm focus of restricted diffusion at the deep aspect of the resection cavity consistent with a small Peri resection infarct (series 6, image 29). Normal expected overlying postoperative dural thickening and enhancement at the right parietal convexity. Trace 2 mm subdural collection overlies the posterior right parietooccipital convexity without mass effect (series 15, image 32). Previously seen surrounding diffusion signal abnormality within the adjacent right parieto-occipital cortex has largely resolved. Again, this  was likely related to superimposed changes of seizure on previous exam. There does remain residual mildly increased diffusion signal at the right thalamus (series 5, image 23). No associated enhancement. This remains indeterminate. Remainder the brain is otherwise unchanged in appearance with no other mass lesion or abnormal enhancement. No hydrocephalus or midline shift. Underlying atrophy with chronic microvascular ischemic disease again noted. No other mass lesion or abnormal enhancement elsewhere within the brain. Vascular: Major intracranial vascular flow voids are preserved. Skull and upper cervical spine: Craniocervical junction within normal limits. Bone marrow signal intensity normal. Post craniotomy changes noted at the right parietal calvarium without adverse features. Sinuses/Orbits: Globes and orbital soft tissues within normal limits. Acute right maxillary sinusitis noted. Mastoid air cells are clear. Other: None. IMPRESSION: 1. Postoperative changes from interval right parietal craniotomy for tumor resection. There has been gross total resection of the tumor, with no definite residual tumor seen on this immediate postoperative examination. 2. Approximate 2 cm focus of restricted diffusion at the deep aspect of the resection cavity, consistent with a small acute ischemic infarct. 3. Previously seen surrounding diffusion signal abnormality within the adjacent right parieto-occipital cortex has resolved. Again, this was likely related to superimposed seizure on prior exam. 4. Persistent mild diffusion signal abnormality at the right thalamus, indeterminate, but similar to prior. Attention at follow-up recommended. 5. Acute right maxillary sinusitis. Electronically Signed   By: BJeannine BogaM.D.   On: 03/12/2022 04:30   CT HEAD WO CONTRAST (5MM)  Result Date: 03/10/2022 CLINICAL DATA:  Mental status change, unknown cause Brain metastases suspected EXAM: CT HEAD WITHOUT CONTRAST TECHNIQUE:  Contiguous axial images were obtained from the base of the skull through the vertex without intravenous contrast. RADIATION DOSE REDUCTION: This exam was performed according to the departmental dose-optimization program which includes automated exposure control, adjustment of the mA and/or kV according to patient size and/or use of  iterative reconstruction technique. COMPARISON:  MRI head 03/09/2022, CT head 03/09/2022 FINDINGS: Brain: No evidence of large-territorial acute infarction. No parenchymal hemorrhage. Redemonstration of a right parietal hyperdense mass measuring up to 2.3 cm better evaluated on MRI head 03/09/2022. No extra-axial collection. Slight parietal sulci mass effect. Otherwise midline shift. No hydrocephalus. Basilar cisterns are patent. Vascular: No hyperdense vessel. Skull: No acute fracture or focal lesion. Sinuses/Orbits: Right maxillary sinus mucosal thickening. Otherwise paranasal sinuses and mastoid air cells are clear. The orbits are unremarkable. Other: None. IMPRESSION: Redemonstration of a right parietal hyperdense mass measuring up to 2.3 cm better evaluated on MRI head 03/09/2022. Electronically Signed   By: Iven Finn M.D.   On: 03/10/2022 18:52   EEG adult  Result Date: 03/10/2022 Lora Havens, MD     03/10/2022  1:27 PM Patient Name: Tabitha Ramirez MRN: [id number] Epilepsy Attending: Lora Havens Referring Physician/Provider: Rosalin Hawking, MD Date: 03/10/2022 Duration: 32.07 mins Patient history:  76 y.o. 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 EEG to evaluate for seizure Level of alertness: Awake, asleep AEDs during EEG study: LEV Technical aspects: This EEG study was done with scalp electrodes positioned according to the 10-20 International system of electrode placement. Electrical  activity was acquired at a sampling rate of '500Hz'$  and reviewed with a high frequency filter of '70Hz'$  and a low frequency filter of '1Hz'$ . EEG data were recorded continuously and digitally stored. Description: The posterior dominant rhythm consists of 8 Hz activity of moderate voltage (25-35 uV) seen predominantly in posterior head regions, symmetric and reactive to eye opening and eye closing. Sleep was characterized by vertex waves, sleep spindles (12 to 14 Hz), maximal frontocentral region. EEG showed continuous generalized and lateralized right hemisphere 3 to 6 Hz theta-delta slowing which at times appears sharply contoured. Physiologic photic driving was not seen during photic stimulation.  Hyperventilation was not performed.   ABNORMALITY - Continuous slow, generalized and lateralized right hemisphere IMPRESSION: This study is suggestive of cortical dysfunction in right hemisphere likely secondary to underlying structural abnormality.  Additionally there is mild to moderate diffuse encephalopathy, nonspecific etiology.  No seizures or epileptiform discharges were seen throughout the recording. Lora Havens   MR BRAIN W WO CONTRAST  Result Date: 03/09/2022 CLINICAL DATA:  Initial evaluation for metastatic disease. EXAM: MRI HEAD WITHOUT AND WITH CONTRAST TECHNIQUE: Multiplanar, multiecho pulse sequences of the brain and surrounding structures were obtained without and with intravenous contrast. CONTRAST:  35m GADAVIST GADOBUTROL 1 MMOL/ML IV SOLN COMPARISON:  Prior CT from earlier the same day. FINDINGS: Brain: Age-related cerebral atrophy. Patchy and confluent T2/FLAIR hyperintensity involving the periventricular and deep white matter both cerebral hemispheres as well as the pons, most consistent with chronic small vessel ischemic disease, moderately advanced in nature. Irregular enhancing mass measuring 3.0 x 2.3 x 1.8 cm seen along the gray-white matter differentiation of the right parietal lobe (AP by  craniocaudad by transverse). Surrounding T2/FLAIR signal intensity consistent with vasogenic edema without significant regional mass effect. Few scattered foci of internal susceptibility artifact consistent with blood products and/or necrosis. Findings most concerning for a possible metastatic lesion. Primary CNS neoplasm would be the primary differential consideration. No other definite lesions or abnormal enhancement elsewhere within the brain. No other mass effect or midline shift. Additionally, there is mildly increased diffusion signal abnormality involving  the cortical gray matter about the right parietal region (series 5, image 31). Overall appearance is suggestive of possible superimposed changes of seizure. Similarly, mild diffusion signal abnormality seen at the right thalamus (series 5, image 23). This could also be related to seizure. A possible small evolving subacute ischemic infarct could also be considered. No significant associated enhancement about these locations. No other evidence for acute or subacute ischemia. Gray-white matter differentiation otherwise maintained. No other areas of chronic cortical infarction. No other acute or chronic intracranial blood products. Ventricles normal size without hydrocephalus. No extra-axial fluid collection. Pituitary gland suprasellar region normal. Vascular: Major intracranial vascular flow voids are maintained. Asymmetric FLAIR signal intensity involving the left transverse and sigmoid sinus without definite T1 correlate, likely slow/sluggish flow. Skull and upper cervical spine: Craniocervical junction with normal limits. Bone marrow signal intensity mildly heterogeneous without focal marrow replacing lesion. No scalp soft tissue abnormality. Sinuses/Orbits: Globes and orbital soft tissues within normal limits. Scattered mucosal thickening noted about the ethmoidal air cells and maxillary sinuses. Superimposed air-fluid level within the right maxillary  sinus, consistent with acute sinusitis. Other: No mastoid effusion. IMPRESSION: 1. 3.0 x 2.3 x 1.8 cm irregular enhancing mass involving the right parietal lobe with localized vasogenic edema. 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 A primary CNS neoplasm would be the primary differential consideration. 2. Mild diffusion signal abnormality involving the cortical gray matter about the right parietal region and right thalamus, suspected to reflect superimposed changes of seizure. Correlation with history and EEG suggested. A possible small evolving subacute ischemic infarct at the right thalamus could be considered in the correct clinical setting, although is felt to be less likely. 3. Underlying age-related cerebral atrophy with moderately advanced chronic microvascular ischemic disease. Electronically Signed   By: Jeannine Boga M.D.   On: 03/09/2022 21:48   CT CHEST ABDOMEN PELVIS W CONTRAST  Result Date: 03/09/2022 CLINICAL DATA:  Brain metastases, unknown primary EXAM: CT CHEST, ABDOMEN, AND PELVIS WITH CONTRAST TECHNIQUE: Multidetector CT imaging of the chest, abdomen and pelvis was performed following the standard protocol during bolus administration of intravenous contrast. RADIATION DOSE REDUCTION: This exam was performed according to the departmental dose-optimization program which includes automated exposure control, adjustment of the mA and/or kV according to patient size and/or use of iterative reconstruction technique. CONTRAST:  157m OMNIPAQUE IOHEXOL 300 MG/ML  SOLN COMPARISON:  None Available. FINDINGS: CT CHEST FINDINGS Cardiovascular: Moderate multi-vessel coronary artery calcification. Global cardiac size within normal limits. No pericardial effusion. Central pulmonary arteries are of normal caliber. Mild atherosclerotic calcification within the thoracic aorta. No aortic aneurysm Mediastinum/Nodes: No enlarged mediastinal, hilar, or axillary lymph nodes. Thyroid  gland, trachea, and esophagus demonstrate no significant findings. Lungs/Pleura: Ground-glass opacity with associated traction bronchiolectasis within the right apex likely represents focal fibrotic change. Subpleural fibrotic change within the a anterior right upper lobe and right middle lobe likely represents post radiation change. The lungs are otherwise clear. No pneumothorax or pleural effusion. Central airways are widely patent. Musculoskeletal: No acute bone abnormality. No lytic or blastic bone lesion. Partial right mastectomy and axillary lymph node dissection has been performed. A indeterminate 9 mm nodule is seen within the a inferomedial right breast, not well characterized on this examination. CT ABDOMEN PELVIS FINDINGS Hepatobiliary: No focal liver abnormality is seen. No gallstones, gallbladder wall thickening, or biliary dilatation. Pancreas: Unremarkable Spleen: Unremarkable Adrenals/Urinary Tract: Adrenal glands are unremarkable. Kidneys are normal, without renal calculi, focal lesion, or hydronephrosis. Bladder is unremarkable. Stomach/Bowel:  Stomach is within normal limits. Appendix appears normal. No evidence of bowel wall thickening, distention, or inflammatory changes. Vascular/Lymphatic: Aortic atherosclerosis. No enlarged abdominal or pelvic lymph nodes. Reproductive: Status post hysterectomy. No adnexal masses. Other: No abdominal wall hernia.  Rectum unremarkable. Musculoskeletal: No acute bone abnormality within the abdomen and pelvis. No lytic or blastic bone lesion. IMPRESSION: 1. No evidence of primary or metastatic disease within the chest, abdomen, and pelvis. 2. Moderate multi-vessel coronary artery calcification. 3. 9 mm indeterminate nodule within the inferomedial right breast, not well characterized on this examination. Correlation with dedicated breast imaging is recommended. Electronically Signed   By: Fidela Salisbury M.D.   On: 03/09/2022 19:03   CT HEAD CODE STROKE WO  CONTRAST`  Result Date: 03/09/2022 CLINICAL DATA:  Code stroke. Mental status change, unknown cause. Left-sided weakness. Headache. EXAM: CT HEAD WITHOUT CONTRAST TECHNIQUE: Contiguous axial images were obtained from the base of the skull through the vertex without intravenous contrast. RADIATION DOSE REDUCTION: This exam was performed according to the departmental dose-optimization program which includes automated exposure control, adjustment of the mA and/or kV according to patient size and/or use of iterative reconstruction technique. COMPARISON:  No pertinent prior exams available for comparison. FINDINGS: Brain: Mild generalized parenchymal atrophy. 2.3 x 1.6 cm hyperdense mass within the right parietal lobe with mild surrounding vasogenic edema (for instance as seen on series 5, image 43). Background moderate patchy and ill-defined hypoattenuation within the cerebral white matter, nonspecific but compatible with chronic small vessel ischemic disease. There is no acute intracranial hemorrhage. No demarcated cortical infarct. No extra-axial fluid collection. No midline shift. Vascular: No hyperdense vessel. Atherosclerotic calcifications. Skull: No fracture or aggressive osseous lesion. Sinuses/Orbits: No mass or acute finding within the imaged orbits. Fluid within the inferior left frontal sinus. Minimal mucosal thickening within the bilateral ethmoid and sphenoid sinuses at the imaged levels. Other: Bilateral TMJ osteoarthrosis. ASPECTS ([place] Stroke Program Early CT Score) - Ganglionic level infarction (caudate, lentiform nuclei, internal capsule, insula, M1-M3 cortex): 7 - Supraganglionic infarction (M4-M6 cortex): 3 Total score (0-10 with 10 being normal): 10 These results were called by telephone at the time of interpretation on 03/09/2022 at 5:10 pm to provider [facility] , who verbally acknowledged these results. IMPRESSION: 2.3 x 1.6 cm hyperdense mass within the right parietal lobe with mild  surrounding vasogenic edema. This is most suspicious for an intracranial metastasis. A brain MRI without and with contrast is recommended for further evaluation. No evidence of acute infarct or acute intracranial hemorrhage. Moderate chronic small vessel ischemic changes within the cerebral white matter. Mild generalized parenchymal atrophy. Paranasal sinus disease, as described. Electronically Signed   By: Kellie Simmering D.O.   On: 03/09/2022 17:15    Labs:  Basic Metabolic Panel: Recent Labs  Lab 03/20/22 0623 03/24/22 0514  NA 134* 138  K 3.9 3.9  CL 103 103  CO2 26 24  GLUCOSE 104* 97  BUN 27* 21  CREATININE 0.95 0.85  CALCIUM 8.4* 8.8*    CBC: Recent Labs  Lab 03/20/22 0623  WBC 10.1  NEUTROABS 6.4  HGB 9.1*  HCT 28.1*  MCV 101.8*  PLT 256    CBG: No results for input(s): "GLUCAP" in the last 168 hours.  Family history.  Mother with unknown type of cancer, CHF, breast cancer.  Brother with colon cancer and diabetes.  Denies any esophageal cancer or rectal cancer  Brief HPI:   Tabitha Ramirez is a 76 y.o. right-handed female with history  of hypertension, hypothyroidism, tobacco use, peripheral neuropathy followed by Dr. 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  Neuro biopsy 04/2014 showed moderate severe loss of myelinated fiber.  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  Last breast exam 08/02/2021 benign mammogram 09/23/2020 benign.  Per chart review independent prior to admission providing assistance for her 71-year-old grandchild.  She lives with her daughter and son-in-law.  Presented to [facility] 03/09/2022 with headache, altered mental status, hand shaking, left facial droop as well as left arm and left leg drift with suspect seizure.  She was loaded with Keppra  in the ED.  CT/MRI of the brain showed a 3.0 x 2.3 x 1.8 cm irregular enhancing mass involving the right parietal lobe with localized vasogenic edema.  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  EEG did show mild to moderate diffuse encephalopathy and negative for seizure.  Admission chemistries unremarkable except BUN 28.  Patient underwent right parietal craniotomy resection of brain tumor 03/11/2022 per Dr. Elayne Guerin.  Patient currently maintained on Keppra for seizure prophylaxis.  She was cleared to begin Lovenox for DVT prophylaxis 03/12/2022.  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  CT chest abdomen pelvis no evidence of disease.  Follow-up oncology services Dr.Rao and initially placed on steroid taper since completed patient would follow-up on discharge with Dr.Gudena for ongoing care.  Therapy evaluations completed due to patient's decreased functional mobility was admitted for a comprehensive rehab program.   Hospital Course: Tabitha Ramirez was admitted to rehab 03/17/2022 for inpatient therapies to consist of PT, ST and OT at least three hours five days a week. Past admission physiatrist, therapy team and rehab RN have worked together to provide customized collaborative inpatient rehab.  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  Status post right parietal craniotomy resection of brain tumor 03/11/2022 per Dr. Cari Caraway.  Patient would follow-up outpatient with oncology services Dr.Gudena for ongoing care and recommendations.  Surgical incision site clean and dry.  Lovenox initiated 03/12/2022 for DVT prophylaxis no bleeding episodes.  Pain managed with use of hydrocodone as needed.  Seizure prophylaxis EEG negative Keppra as indicated.  Crestor for hyperlipidemia.  Hormone supplement maintained for patient's hypothyroidism.  Anemia iron supplement as directed latest  hemoglobin 9.1.  Patient did have history of tobacco use provide counseling regards to cessation of nicotine products.  History of peripheral neuropathy followed by Dr. Krista Blue in outpatient.     Blood pressures were monitored on TID basis and controlled     Rehab course: During patient's stay in rehab weekly team conferences were held to monitor patient's progress, set goals and discuss barriers to discharge. At admission, patient required moderate assist 20 feet rolling walker moderate assist sit to stand  Physical exam.  Blood pressure 116/75 pulse 69 temperature 99 respirations 18 oxygen saturations 100% room air Constitutional.  No acute distress HEENT.  Cranial incision site clean and dry.  Pupils round and reactive to light Neck.  Supple nontender no JVD without thyromegaly Cardiac regular rate and rhythm without any extra sounds or murmur heard Abdomen.  Soft nontender positive bowel sounds without rebound Respiratory effort normal no respiratory distress without wheeze Extremity.  No clubbing cyanosis or edema Neurologic.  Oriented x3 follows commands able to repeat.  No dysarthria noted.  Cranial nerves II through XII intact. Strength 5 out of 5 in right upper extremity right lower extremity Strength 5 out of 5 left lower extremity Strength 4+/5 out of 5 in left upper extremity   He/She  has had improvement in activity tolerance, balance, postural control as well as ability to compensate for deficits. He/She has had improvement in functional use RUE/LUE  and RLE/LLE as well as improvement in awareness.  Supine to sit with verbal cues.  Ambulates 500 feet contact-guard no assistive device with cues for up right gaze to improve posture and balance.  Performed block training of transfers from mat to chair.  Perform sit to stand with cues for sequencing and contact-guard during mobility.  Bathing completed from shower seat with verbal cues and minimal assist when turning to enter walk-in  shower.  Contact-guard with standing to wash buttocks.  Dressing tasks completed from wheelchair with cues to recall Hemi dressing techniques and minimal assist for orienting clothing.  Patient ambulates to the therapy apartment and practiced tub bench transfers as well as stepping out of tub and sitting on shower seat as simulated from home environment with contact-guard.  Speech therapy and patient completed medication management task to increase awareness of current medication regimen as well as pillbox organization.  Full family teaching completed plan discharged to home       Disposition: Discharge to home    Diet: Regular  Special Instructions: No driving smoking or alcohol  Medications at discharge 1.  Tylenol as needed 2.  Ferrous sulfate 3 and 25 mg daily 3.  Hydrocodone 1 tablet every 4 hours as needed pain 4.  Keppra 500 mg p.o. twice daily 5.  Synthroid 50 mcg p.o. daily 6.  Crestor 10 mg p.o. daily 7.  Vitamin B12 1000 mcg p.o. daily  30-35 minutes were spent completing discharge summary and discharge planning  Discharge Instructions     Ambulatory referral to Occupational Therapy   Complete by: As directed    Evaluate and treat   Ambulatory referral to Physical Medicine Rehab   Complete by: As directed    Moderate complexity follow-up 1 to 2 weeks metastatic carcinoma to the brain   Ambulatory referral to Physical Therapy   Complete by: As directed    Evaluate and treat   Ambulatory referral to Speech Therapy   Complete by: As directed    Evaluate and treat        Follow-up Information     [name], MD Follow up.   Specialty: Physical Medicine and Rehabilitation Why: Office to call for appointment Contact information: [address] [place] [address] [phone]         Nicholas Lose, MD Follow up.   Specialty: Hematology and Oncology Why: Call for appointment Contact information: [place] [address] [phone]         Meade Maw, MD Follow up.   Specialty: Neurosurgery Why: call for appointment Contact information: [place] [address] [phone]                 Signed: Cathlyn Parsons 03/27/2022, 5:32 AM

## 2022-03-24 NOTE — Progress Notes (Signed)
PROGRESS NOTE   Subjective/Complaints:  Pt doing well. No problems this morning. Says family ed went ok yesterday  ROS: Patient denies fever, rash, sore throat, blurred vision, dizziness, nausea, vomiting, diarrhea, cough, shortness of breath or chest pain, joint or back/neck pain, headache, or mood change.    Objective:   No results found. No results for input(s): "WBC", "HGB", "HCT", "PLT" in the last 72 hours.  Recent Labs    03/24/22 0514  NA 138  K 3.9  CL 103  CO2 24  GLUCOSE 97  BUN 21  CREATININE 0.85  CALCIUM 8.8*     Intake/Output Summary (Last 24 hours) at 03/24/2022 1019 Last data filed at 03/23/2022 1727 Gross per 24 hour  Intake 320 ml  Output --  Net 320 ml        Physical Exam: Vital Signs Blood pressure 105/68, pulse 67, temperature 98.3 F (36.8 C), temperature source Oral, resp. rate 16, height '5\' 4"'$  (1.626 m), weight 63.4 kg, SpO2 99 %.   Constitutional: No distress . Vital signs reviewed. HEENT: NCAT, EOMI, oral membranes moist Neck: supple Cardiovascular: RRR without murmur. No JVD    Respiratory/Chest: CTA Bilaterally without wheezes or rales. Normal effort    GI/Abdomen: BS +, non-tender, non-distended Ext: no clubbing, cyanosis, or edema Psych: pleasant and cooperative  Skin: surgical incision CDI. Small piece of suture still exposed Neurologic: Cranial nerves II through XII intact, motor strength is 5/5 in RIght and 4+/5 left deltoid, bicep, tricep, grip, hip flexor, knee extensors, ankle dorsiflexor and plantar flexor Sensory exam equal LT in BUE  Musculoskeletal: Full range of motion in all 4 extremities. No joint swelling    Assessment/Plan: 1. Functional deficits which require 3+ hours per day of interdisciplinary therapy in a comprehensive inpatient rehab setting. Physiatrist is providing close team supervision and 24 hour management of active medical problems listed  below. Physiatrist and rehab team continue to assess barriers to discharge/monitor patient progress toward functional and medical goals  Care Tool:  Bathing    Body parts bathed by patient: Right arm, Left arm, Chest, Abdomen, Front perineal area, Buttocks, Right upper leg, Left upper leg, Right lower leg, Left lower leg, Face   Body parts bathed by helper: Left lower leg, Right lower leg Body parts n/a: Buttocks   Bathing assist Assist Level: Minimal Assistance - Patient > 75%     Upper Body Dressing/Undressing Upper body dressing   What is the patient wearing?: Pull over shirt    Upper body assist Assist Level: Minimal Assistance - Patient > 75%    Lower Body Dressing/Undressing Lower body dressing      What is the patient wearing?: Pants, Incontinence brief     Lower body assist Assist for lower body dressing: Minimal Assistance - Patient > 75%     Toileting Toileting    Toileting assist Assist for toileting: Minimal Assistance - Patient > 75%     Transfers Chair/bed transfer  Transfers assist     Chair/bed transfer assist level: Minimal Assistance - Patient > 75%     Locomotion Ambulation   Ambulation assist      Assist level: Minimal Assistance - Patient >  75% Assistive device: Walker-rolling Max distance: 100'   Walk 10 feet activity   Assist     Assist level: Minimal Assistance - Patient > 75% Assistive device: Walker-rolling   Walk 50 feet activity   Assist    Assist level: Minimal Assistance - Patient > 75% Assistive device: Walker-rolling    Walk 150 feet activity   Assist    Assist level: Moderate Assistance - Patient - 50 - 74% Assistive device: No Device    Walk 10 feet on uneven surface  activity   Assist Walk 10 feet on uneven surfaces activity did not occur: Safety/medical concerns (balance deficits)         Wheelchair     Assist Is the patient using a wheelchair?: Yes Type of Wheelchair: Manual     Wheelchair assist level: Maximal Assistance - Patient 25 - 49% Max wheelchair distance: 50    Wheelchair 50 feet with 2 turns activity    Assist        Assist Level: Maximal Assistance - Patient 25 - 49%   Wheelchair 150 feet activity     Assist  Wheelchair 150 feet activity did not occur:  (L hand inattention)   Assist Level: Maximal Assistance - Patient 25 - 49% (per PT evaluation Documentation)   Blood pressure 105/68, pulse 67, temperature 98.3 F (36.8 C), temperature source Oral, resp. rate 16, height '5\' 4"'$  (1.626 m), weight 63.4 kg, SpO2 99 %.  Medical Problem List and Plan: 1. 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  Status post right parietal craniotomy resection of brain tumor 03/11/2022.  Patient is to follow-up outpatient oncology services Dr.Gudena             -patient may shower cover incision             -ELOS/Goals: 7/3/2-- Sup with PT/OT/SLP   -Continue CIR therapies including PT, OT, and SLP  2.  Antithrombotics: -DVT/anticoagulation:  Pharmaceutical: Lovenox initiated 03/12/2022             -antiplatelet therapy: N/A 3. Pain Management: Hydrocodone as needed 4. Mood/Sleep: Provide emotional support             -antipsychotic agents: N/A 5. Neuropsych/cognition: This patient is capable of making decisions on her own behalf. 6. Skin/Wound Care: Routine skin checks,  7. Fluids/Electrolytes/Nutrition:  -BUN improved to 21 6/30  -continue to push fluids  -recheck labs Monday   8.  Seizure prophylaxis.  EEG negative although questionable seizure at time of acute admission -continue Keppra 500 mg twice daily -no seizures since rehab admit--continue keppra at discharge 9.  Hyperlipidemia.  Crestor '10mg'$  10.  Hypothyroidism.  Synthroid 50 mcg dialy 11.  Anemia.  Continue iron supplement. Last HGB 9.1  Follow-up CBC Monday 12.  History of tobacco use.  Provide counseling 13.  History of peripheral  neuropathy.  Patient has been seen by Dr. Krista Blue in the past. 14. ? carpal tunnel L wrist. She reports she may have been told this in the past.   - night time use wrist splint could be beneficial--hold off at present 15. Leukocytosis, no signs of infection at this time, likely due to prior Decadron, monitor  -6/26- wbc's down to 10k--f/u monday    LOS: 7 days A FACE TO FACE EVALUATION WAS PERFORMED  [name] 03/24/2022, 10:19 AM

## 2022-03-24 NOTE — Progress Notes (Signed)
Occupational Therapy Session Note  Patient Details  Name: Tabitha Ramirez MRN: [id number] Date of Birth: September 29, 1945  Today's Date: 03/24/2022 OT Individual Time: 9093-1121 OT Individual Time Calculation (min): 68 min    Short Term Goals: Week 1:  OT Short Term Goal 1 (Week 1): STGs=LTGs  Skilled Therapeutic Interventions/Progress Updates:    Pt received supine in bed and agreeable to OT session. Pt completed functional mobility to the restroom with RW and supervision. Voided bladder on the toilet. Washed hands at the sink but approached to right side without bringing her left side forward. Completed functional mobility to rehab gym for BITS visual scanning activity in standing to address LUE fine motor control, standing balance, and visual tracking. Pt with excellent FMC of LUE and visual scanning to the left side. BITS memory task in standing with supervision. Program went up to 7 words. Pt unable to do 7 words without mod cueing for sequence order. Providing verbal cueing to speak words out loud to assist with recall. Pt on NuStep to increase UB/LB strength and overall endurance to increase independence and safety with ADLs. Workload 6. Steps per minute: 46 Time: 5 minutes. Borg exertion scale 13. Completed functional mobility back to room. Demonstrated each HEP and had pt return demonstration. Pt with some motor planning deficits in grasping the red thera-band and decreased strength in LUE. Would benegit pt to follow-up on HEP before d/c. Pt left in w/c with alarm on, call bell in reach, and all immediate needs met.  Home exercise program:  [email or web address]  Access Code: [email or web address]   Therapy Documentation Precautions:  Precautions Precautions: Fall Precaution Comments: L field cut Restrictions Weight Bearing Restrictions: No  Therapy/Group: Individual Therapy  Catalina Lunger 03/24/2022, 7:15 AM

## 2022-03-24 NOTE — Progress Notes (Signed)
Speech Language Pathology Daily Session Note  Patient Details  Name: Tabitha Ramirez MRN: [id number] Date of Birth: 1946-06-22  Today's Date: 03/24/2022 SLP Individual Time: 1345-1430 SLP Individual Time Calculation (min): 45 min  Short Term Goals: Week 1: SLP Short Term Goal 1 (Week 1): STG=LTG due to ELOS  Skilled Therapeutic Interventions: Skilled ST treatment focused on cognitive goals. SLP facilitated mildly complex problem solving skills using ALFA "Solving daily math problems" subtest. Pt achieved 30% accuracy given no cues, 50% accuracy with min A verbal cues, and 100% accuracy when given mod-max verbal/visual cues for problem solving. Pt recalled events from previous therapy sessions with accurate detail with mod I for use of memory notebook, and recorded events from speech therapy session following initial verbal cue to appropriately summarize at mod I level. SLP provided education on other uses for memory notebook at discharge to further support memory (writing down to-do lists, reminders, grocery list, etc.) Pt verbalized understanding through teach back. Patient was left in wheelchair with alarm activated and immediate needs within reach at end of session. Continue per current plan of care.      Pain  None/denied  Therapy/Group: Individual Therapy  Ayron Fillinger T Ivie Savitt 03/24/2022, 2:00 PM

## 2022-03-24 NOTE — Progress Notes (Signed)
Occupational Therapy Session Note  Patient Details  Name: [name] MRN: [id number] Date of Birth: 1946/07/09  Today's Date: 03/24/2022 OT Individual Time: 0730-0800 OT Individual Time Calculation (min): 30 min    Short Term Goals: Week 1:  OT Short Term Goal 1 (Week 1): STGs=LTGs  Skilled Therapeutic Interventions/Progress Updates:     Upon OT arrival, pt semi recumbent in bed. Pt agreeable to OT treatment session and reports no pain. Pt requests to toilet. Pt completes supine to sit transfer with SBA and sit to stand transfer with CGA using RW. Pt ambulates to bathroom completing toilet transfer with CGA and toileting with Min A to pull up pants adequately. Pt then ambulates to sink to complete hand hygiene with CGA, oral care with CGA, and wash face with CGA while standing. Pt completes stand to sit transfer with CGA and donns pants with CGA. Pt completes sit to stand transfer with CGA using RW and ambulates to her tray with CGA to retrieve memory book. Pt completes stand to sit transfer to bed with CGA and sit to supine transfer with SBA. Pt lays in bed to write in memory book with Supervision. Pt making progress towards stated OT goals and continues to benefit from OT services to achieve highest level of independence. Pt left in bed with all safety measures in place.  Therapy Documentation Precautions:  Precautions Precautions: Fall Precaution Comments: L field cut Restrictions Weight Bearing Restrictions: No    Therapy/Group: Individual Therapy  [name] 03/24/2022, 8:12 AM

## 2022-03-24 NOTE — Progress Notes (Signed)
Physical Therapy Session Note  Patient Details  Name: Tabitha Ramirez MRN: [id number] Date of Birth: 1946/07/27  Today's Date: 03/24/2022 PT Individual Time: 1650-1729 PT Individual Time Calculation (min): 39 min   Short Term Goals: Week 1:  PT Short Term Goal 1 (Week 1): = LTGS due to ELOS  Skilled Therapeutic Interventions/Progress Updates:     Pt received seated in [facility] and agrees to therapy. No complaint of pain. Pt performs sit to stand with CGA and improved sequencing and anterior weight shift relative to previous sessions. Pt ambulates x300' to dayroom with cues for upright gaze to improve posture and balance, and increasing gait speed and stride length to decrease risk for falls. Pt transfers to Nustep with CGA and improved sequencing relative to previous sessions. Pt completes Nustep for NMR and strength and endurance training. Pt completes 6x2:00 at workload of 5 with average steps per minute >50. Brief seated rest breaks between each bout.  Pt ambulates x30' to high low mat, and incidentally walks into rolling stool in her path, without walking around it. When asked why she did not walk around stool, pt is unsure, but says that she could see the stool. Pt then tasked with performing obstacle course in which she has to walk around cones in a weaving pattern. Pt completes with CGA but does contact several cones instead of walking around them. Task progressed by adding small bar which pt is asked to step over, in addition to walking around cones. With added challenge, pt begins to make contact with more of the cones, and often does not attempt to walk around them at all. PT provides verbal cues and minA for stability. Pt says that she see cones, but it does not "register" to her that she needs to walk around them.  Pt ambulates back to room with CGA. Left seated in WC with alarm intact and all needs within reach.  Therapy Documentation Precautions:  Precautions Precautions:  Fall Precaution Comments: L field cut Restrictions Weight Bearing Restrictions: No  Therapy/Group: Individual Therapy  Breck Coons, PT, DPT 03/24/2022, 5:38 PM

## 2022-03-25 DIAGNOSIS — D496 Neoplasm of unspecified behavior of brain: Secondary | ICD-10-CM | POA: Diagnosis not present

## 2022-03-25 MED ORDER — ORAL CARE MOUTH RINSE: 15.0000 mL | OROMUCOSAL | Status: DC | PRN

## 2022-03-25 NOTE — Progress Notes (Signed)
PROGRESS NOTE   Subjective/Complaints:  Asking if will go home on shot sin belly.  Also wondering when can comb/wash hair as well as "go to beauty salon".   Usual soreness in head, but otherwise, no complaints.   ROS:  Pt denies SOB, abd pain, CP, N/V/C/D, and vision changes  Objective:   No results found. No results for input(s): "WBC", "HGB", "HCT", "PLT" in the last 72 hours.  Recent Labs    03/24/22 0514  NA 138  K 3.9  CL 103  CO2 24  GLUCOSE 97  BUN 21  CREATININE 0.85  CALCIUM 8.8*     Intake/Output Summary (Last 24 hours) at 03/25/2022 0951 Last data filed at 03/25/2022 0733 Gross per 24 hour  Intake 800 ml  Output --  Net 800 ml        Physical Exam: Vital Signs Blood pressure 111/64, pulse 74, temperature 97.8 F (36.6 C), temperature source Oral, resp. rate 18, height '5\' 4"'$  (1.626 m), weight 63.4 kg, SpO2 93 %.    General: awake, alert, appropriate, sitting up in bed; finished breakfast; NAD HENT: conjugate gaze; oropharynx moist- don't see sutures on head- only some scabbing- hair very matted CV: regular rate; no JVD Pulmonary: CTA B/L; no W/R/R- good air movement GI: soft, NT, ND, (+)BS Psychiatric: appropriate- pleasant Neurological: alert   Skin: surgical incision CDI. Small piece of suture still exposed Neurologic: Cranial nerves II through XII intact, motor strength is 5/5 in RIght and 4+/5 left deltoid, bicep, tricep, grip, hip flexor, knee extensors, ankle dorsiflexor and plantar flexor Sensory exam equal LT in BUE  Musculoskeletal: Full range of motion in all 4 extremities. No joint swelling    Assessment/Plan: 1. Functional deficits which require 3+ hours per day of interdisciplinary therapy in a comprehensive inpatient rehab setting. Physiatrist is providing close team supervision and 24 hour management of active medical problems listed below. Physiatrist and rehab team  continue to assess barriers to discharge/monitor patient progress toward functional and medical goals  Care Tool:  Bathing    Body parts bathed by patient: Right arm, Left arm, Chest, Abdomen, Front perineal area, Buttocks, Right upper leg, Left upper leg, Right lower leg, Left lower leg, Face   Body parts bathed by helper: Left lower leg, Right lower leg Body parts n/a: Buttocks   Bathing assist Assist Level: Minimal Assistance - Patient > 75%     Upper Body Dressing/Undressing Upper body dressing   What is the patient wearing?: Pull over shirt    Upper body assist Assist Level: Minimal Assistance - Patient > 75%    Lower Body Dressing/Undressing Lower body dressing      What is the patient wearing?: Pants, Incontinence brief     Lower body assist Assist for lower body dressing: Minimal Assistance - Patient > 75%     Toileting Toileting    Toileting assist Assist for toileting: Minimal Assistance - Patient > 75%     Transfers Chair/bed transfer  Transfers assist     Chair/bed transfer assist level: Minimal Assistance - Patient > 75%     Locomotion Ambulation   Ambulation assist      Assist level:  Minimal Assistance - Patient > 75% Assistive device: Walker-rolling Max distance: 100'   Walk 10 feet activity   Assist     Assist level: Minimal Assistance - Patient > 75% Assistive device: Walker-rolling   Walk 50 feet activity   Assist    Assist level: Minimal Assistance - Patient > 75% Assistive device: Walker-rolling    Walk 150 feet activity   Assist    Assist level: Moderate Assistance - Patient - 50 - 74% Assistive device: No Device    Walk 10 feet on uneven surface  activity   Assist Walk 10 feet on uneven surfaces activity did not occur: Safety/medical concerns (balance deficits)         Wheelchair     Assist Is the patient using a wheelchair?: Yes Type of Wheelchair: Manual    Wheelchair assist level: Maximal  Assistance - Patient 25 - 49% Max wheelchair distance: 50    Wheelchair 50 feet with 2 turns activity    Assist        Assist Level: Maximal Assistance - Patient 25 - 49%   Wheelchair 150 feet activity     Assist  Wheelchair 150 feet activity did not occur:  (L hand inattention)   Assist Level: Maximal Assistance - Patient 25 - 49% (per PT evaluation Documentation)   Blood pressure 111/64, pulse 74, temperature 97.8 F (36.6 C), temperature source Oral, resp. rate 18, height '5\' 4"'$  (1.626 m), weight 63.4 kg, SpO2 93 %.  Medical Problem List and Plan: 1. 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  Status post right parietal craniotomy resection of brain tumor 03/11/2022.  Patient is to follow-up outpatient oncology services Dr.Gudena             -patient may shower cover incision             -ELOS/Goals: 7/3/2-- Sup with PT/OT/SLP   -con't CIR- PT, OT and SLP 2.  Antithrombotics: -DVT/anticoagulation:  Pharmaceutical: Lovenox initiated 03/12/2022  7/1- pt asking if will go home on lovenox- asked her to speak with Dr Naaman Plummer about this.              -antiplatelet therapy: N/A 3. Pain Management: Hydrocodone as needed  7/1- just sore, no "pain"- con't regimen 4. Mood/Sleep: Provide emotional support             -antipsychotic agents: N/A 5. Neuropsych/cognition: This patient is capable of making decisions on her own behalf. 6. Skin/Wound Care: Routine skin checks,  7. Fluids/Electrolytes/Nutrition:  -BUN improved to 21 6/30  -continue to push fluids  -recheck labs Monday   8.  Seizure prophylaxis.  EEG negative although questionable seizure at time of acute admission -continue Keppra 500 mg twice daily -no seizures since rehab admit--continue keppra at discharge 9.  Hyperlipidemia.  Crestor '10mg'$  10.  Hypothyroidism.  Synthroid 50 mcg dialy 11.  Anemia.  Continue iron supplement. Last HGB 9.1  Follow-up CBC  Monday 12.  History of tobacco use.  Provide counseling 13.  History of peripheral neuropathy.  Patient has been seen by Dr. Krista Blue in the past. 14. ? carpal tunnel L wrist. She reports she may have been told this in the past.   - night time use wrist splint could be beneficial--hold off at present 15. Leukocytosis, no signs of infection at this time, likely due to prior Decadron, monitor  -6/26- wbc's down to 10k--f/u monday  16. Hair matted  7/1- explained can comb  hair now- but be careful of incision since has little scabs- should be able to go to beauty salon in 1 week from d/c- and should be able to wash hair if careful this weekend  LOS: 8 days A FACE TO FACE EVALUATION WAS PERFORMED  Markail Diekman 03/25/2022, 9:51 AM

## 2022-03-25 NOTE — Plan of Care (Signed)
  Problem: RH Problem Solving Goal: LTG Patient will demonstrate problem solving for (SLP) Description: LTG:  Patient will demonstrate problem solving for basic/complex daily situations with cues  (SLP) Outcome: Not Met (add Reason)   Problem: RH Memory Goal: LTG Patient will use memory compensatory aids to (SLP) Description: LTG:  Patient will use memory compensatory aids to recall biographical/new, daily complex information with cues (SLP) Outcome: Completed/Met

## 2022-03-25 NOTE — Progress Notes (Signed)
Speech Language Pathology Discharge Summary  Patient Details  Name: Tabitha Ramirez MRN: [id number] Date of Birth: January 14, 1946  Today's Date: 03/25/2022 SLP Individual Time: 1124-1205 SLP Individual Time Calculation (min): 41 min  Skilled Therapeutic Interventions:  Skilled ST treatment focused on cognitive goals. SLP facilitated session by providing sup A verbal cues for basic verbal reasoning, working memory, and sustained attention task to generate words within categories based on initial letters of the alphabet. SLP and patient engaged in discussion regarding cognitively engaging tasks for home. Pt had various questions pertaining to discharge and outpatient therapies. Pt independently recorded information into memory notebook with adequate legibility and detail. Patient was left in bed with alarm activated and immediate needs within reach at end of session.   Patient has met 1 of 2 long term goals.  Patient to discharge at overall Min;Mod level.  Reasons goals not met: Pt requiring mod-to-max A for complex problem solving and reasoning skills   Clinical Impression/Discharge Summary: Patient has made functional gains and has met 1 of 2 long-term goals this admission. Pt did not meet complex problem solving goal, as progress has been slower in this regard. Pt is currently requiring mod-to-max A for completing complex cognitive tasks in regards to problem solving, reasoning, and organization. Pt complete basic level tasks with sup-to-min A cues. Pt demonstrates excellent carry over with use of external memory aids and demonstrates consistent use of memory notebook with effectiveness and accuracy. Patient and family education is complete and patient to discharge at overall min-to-mod A level. Patient's care partner is independent to provide the necessary physical and cognitive assistance at discharge. Patient would benefit from continued SLP services in outpatient setting to maximize cognitive function  and functional independence.   Care Partner:  Caregiver Able to Provide Assistance: Yes  Type of Caregiver Assistance: Physical;Cognitive  Recommendation:  Outpatient SLP;24 hour supervision/assistance  Rationale for SLP Follow Up: Reduce caregiver burden;Maximize cognitive function and independence   Equipment: None   Reasons for discharge: Discharged from hospital   Patient/Family Agrees with Progress Made and Goals Achieved: Yes    Wood Novacek T Leisel Pinette 03/25/2022, 4:24 PM

## 2022-03-26 DIAGNOSIS — D496 Neoplasm of unspecified behavior of brain: Secondary | ICD-10-CM | POA: Diagnosis not present

## 2022-03-26 NOTE — Progress Notes (Signed)
Occupational Therapy Discharge Summary  Patient Details  Name: Tabitha Ramirez MRN: [id number] Date of Birth: 1946-09-21   Patient has met 3 of 7 long term goals due to improved activity tolerance, improved balance, ability to compensate for deficits, functional use of  LEFT upper and LEFT lower extremity, improved awareness, and improved coordination.  Patient to discharge at overall at the overall CGA level for self-care tasks. Patient's care partner is independent to provide the necessary physical and cognitive assistance at discharge.    Reasons goals not met: Tub transfer, bathing, lower body dressing, and toilet transfer goals not met at the supervision level vs CGA secondary to L visual field deficits, bilateral integration and LUE coordination deficits, and safety cues for awareness needed frequently throughout sessions.  Recommendation:  Patient will benefit from ongoing skilled OT services in outpatient setting to continue to advance functional skills in the area of BADL, iADL, and Reduce care partner burden.  Equipment: 3 in 1 BSC  Reasons for discharge: treatment goals met  Patient/family agrees with progress made and goals achieved: Yes  OT Discharge Precautions/Restrictions  Precautions Precautions: Fall Precaution Comments: L field cut Restrictions Weight Bearing Restrictions: No ADL ADL Eating: Set up Where Assessed-Eating: Wheelchair Grooming: Supervision/safety Where Assessed-Grooming: Standing at sink Upper Body Bathing: Supervision/safety Where Assessed-Upper Body Bathing: Shower Lower Body Bathing: Contact guard Where Assessed-Lower Body Bathing: Shower Upper Body Dressing: Supervision/safety Where Assessed-Upper Body Dressing: Edge of bed Lower Body Dressing: Minimal assistance Where Assessed-Lower Body Dressing: Edge of bed Toileting: Supervision/safety Where Assessed-Toileting: Toilet, Bedside Commode Toilet Transfer: Therapist, music  Method: Counselling psychologist: Raised toilet seat Tub/Shower Transfer: Metallurgist Method: Sit pivot, Ambulating Tub/Shower Equipment: Radio broadcast assistant, Energy manager: Environmental education officer Method: Ambulating, Sit pivot Youth worker: Radio broadcast assistant, Grab bars Vision Baseline Vision/History: 1 Wears glasses Patient Visual Report: No change from baseline Vision Assessment?: Vision impaired- to be further tested in functional context;Yes Eye Alignment: Within Functional Limits Ocular Range of Motion: Within Functional Limits Tracking/Visual Pursuits: Able to track stimulus in all quads without difficulty Visual Fields: Left visual field deficit Perception  Perception: Impaired Praxis Praxis: Impaired Praxis Impairment Details: Initiation;Motor planning Cognition Cognition Overall Cognitive Status: Impaired/Different from baseline Arousal/Alertness: Awake/alert Orientation Level: Person;Place;Situation Person: Oriented Place: Oriented Situation: Oriented Memory: Appears intact Awareness: Appears intact Problem Solving: Impaired Safety/Judgment: Impaired Brief Interview for Mental Status (BIMS) Repetition of Three Words (First Attempt): 3 Temporal Orientation: Year: Correct Temporal Orientation: Month: Accurate within 5 days Temporal Orientation: Day: Correct Recall: "Sock": Yes, no cue required Recall: "Blue": Yes, no cue required Recall: "Bed": Yes, no cue required BIMS Summary Score: 15 Sensation Sensation Light Touch: Impaired Detail Peripheral sensation comments: hx of PN; Light Touch Impaired Details: Impaired LUE;Impaired RUE Proprioception: Impaired Detail Proprioception Impaired Details: Impaired RUE;Impaired LUE Coordination Gross Motor Movements are Fluid and Coordinated: No Fine Motor Movements are Fluid and Coordinated: No Finger Nose Finger Test: L impairments-  ataxia Heel Shin Test: L with reduced accuracy, excursion and speed Motor  Motor Motor: Ataxia Mobility  Bed Mobility Bed Mobility: Supine to Sit;Sit to Supine Supine to Sit: Supervision/Verbal cueing Sit to Supine: Supervision/Verbal cueing Transfers Sit to Stand: Supervision/Verbal cueing Stand to Sit: Supervision/Verbal cueing  Trunk/Postural Assessment  Cervical Assessment Cervical Assessment: Within Functional Limits Thoracic Assessment Thoracic Assessment: Exceptions to [facility] (rounded shoulders) Lumbar Assessment Lumbar Assessment: Within Functional Limits Postural Control Postural Control: Deficits on evaluation Righting Reactions:  delayed and inadequate  Balance Balance Balance Assessed: Yes Dynamic Sitting Balance Dynamic Sitting - Balance Support: Feet supported Dynamic Sitting - Level of Assistance: 6: Modified independent (Device/Increase time) Static Standing Balance Static Standing - Balance Support: Left upper extremity supported;During functional activity Static Standing - Level of Assistance: 5: Stand by assistance Dynamic Standing Balance Dynamic Standing - Balance Support: Left upper extremity supported Dynamic Standing - Level of Assistance: 5: Stand by assistance (CGA) Dynamic Standing - Balance Activities: Reaching for objects Extremity/Trunk Assessment RUE Assessment RUE Assessment: Within Functional Limits General Strength Comments: 4/5 LUE Assessment LUE Assessment: Within Functional Limits General Strength Comments: 4/5   [place], MS, OTR/L  03/26/2022, 7:28 AM

## 2022-03-26 NOTE — Progress Notes (Signed)
**Note De-Identified Tabitha Obfuscation** Physical Therapy Session Note  Patient Details  Name: Tabitha Ramirez MRN: [id number] Date of Birth: 09-02-1946  Today's Date: 03/26/2022 PT Individual Time: 1130-1200 PT Individual Time Calculation (min): 30 min   Short Term Goals: Week 1:  PT Short Term Goal 1 (Week 1): = LTGS due to ELOS  Skilled Therapeutic Interventions/Progress Updates:     Patient in w/c in the room upon PT arrival. Patient alert and agreeable to PT session. Patient denied pain during session. Patient eager for d/c tomorrow. Reports that she is excited and ready to go home tomorrow.  Educated patient on fall risk/prevention, home modifications to prevent falls, and activation of emergency services in the event of a fall during session.   Therapeutic Activity: Bed Mobility: Patient performed supine to/from sit with supervision-mod I for increased time in a flat bed without use of bed rails.  Transfers: Patient performed sit to/from stand from various household height surfaces with supervision using a RW throughout session. Provided cues for hand placement and reaching back to sit x2. Patient performed a simulated sedan height car transfer with supervision using RW. Provided cues for safe technique.  Gait Training:  Patient ambulated >100 feet x2 and >150 feet using RW with supervision. Ambulated with decreased gait speed, decreased step length and height L>R, mild L inattention, forward trunk lean, and downward head gaze. Provided verbal cues for erect posture, looking ahead and to the L to scan for objects, increased foot clearance, and max A for navigation throughout. Patient ascended/descended 12x6" steps using B rails x4 steps with CGA-close supervision, 1 rail and HHA x4 steps with min A, and no rails and HHA x4 steps with min A. Performed reciprocal gait pattern  throughout. Provided cues for technique and sequencing.  Patient ambulated up/down a ramp, over 10 feet of mulch (unlevel surface), and up/down a curb to  simulate community ambulation over unlevel surfaces with CGA using RW. Provided cues for technique and use of AD.  Patient stood without upper extremity support or AD x1 and picked up a small cup off the floor with supervision for safety/balance.   Patient in w/c in the room at end of session with breaks locked, seat belt alarm set, and all needs within reach. Patient without further questions or concerns pertaining to d/c.   Therapy Documentation Precautions:  Precautions Precautions: Fall Precaution Comments: L field cut Restrictions Weight Bearing Restrictions: No    Therapy/Group: Individual Therapy  Carmyn Hamm L Tallulah Hosman PT, DPT  03/26/2022, 6:31 PM

## 2022-03-26 NOTE — Progress Notes (Signed)
Occupational Therapy Session Note  Patient Details  Name: Tabitha Ramirez MRN: [id number] Date of Birth: 1945/10/05  Today's Date: 03/26/2022 OT Individual Time: 8309-4076 OT Individual Time Calculation (min): 43 min    Short Term Goals: Week 1:  OT Short Term Goal 1 (Week 1): STGs=LTGs  Skilled Therapeutic Interventions/Progress Updates:  Skilled OT intervention completed with focus on d/c planning, visual scanning and cognitive sequencing. Pt received upright in bed, no c/o pain. Completed bed mobility with supervision, then donned shoes with min A, and attempted to have pt tie shoe laces with figure 4 LE position however with challenge coordinating BUE and therapist stepped in with total A. Education provided about adaptive shoes like velcro straps, or pre-tying while on lap for better coordination. Sit > stand without AD with CGA, then ambulated with RW with CGA throughout session from room <> gym, bathroom and back to chair. Cues needed for visually attending to objects on the floor on the L side, with pt not aware of running into the items, also with cues for slowing pace to increase awareness of obstacles for safety.  Standing at [facility] with RW and supervision A, pt participated in bells cancellation (not missing any bells, however increased time needed for processing and locating and cues needed to locate last 2 unhit bells), trail making A (with min difficulty only with locating dots on L side, however able to locate with time), and trail making B (with over 5 mins needed to complete, however with initial mod cues fading to min pt able to complete, with most difficulty when alternating attention between numerical and alphabetical dots).  Back in room, pt completed urinary void and toileting with CGA for balance only, and returned to w/c, with belt alarm on, pt filling out memory notebook with independence and all needs in reach at end of session.    Therapy Documentation Precautions:   Precautions Precautions: Fall Precaution Comments: L field cut Restrictions Weight Bearing Restrictions: No    Therapy/Group: Individual Therapy  Blase Mess, MS, OTR/L  03/26/2022, 7:29 AM

## 2022-03-26 NOTE — Progress Notes (Signed)
Physical Therapy Discharge Summary  Patient Details  Name: FINNLEE GUARNIERI MRN: [id number] Date of Birth: 23-Aug-1946   Patient has met 11 of 11 long term goals due to improved activity tolerance, improved balance, improved postural control, increased strength, ability to compensate for deficits, improved attention, and improved awareness.  Patient to discharge at an ambulatory level Supervision using a RW.   Patient's care partner is independent to provide the necessary physical and cognitive assistance at discharge.  Reasons goals not met: All PT goals met at this time.  Recommendation:  Patient will benefit from ongoing skilled PT services in home health setting to continue to advance safe functional mobility, address ongoing impairments in balance, activity tolerance, functional mobility, gait and stair training, community integration, patient/caregiver education, and minimize fall risk.  Equipment: Recommending use of RW with all mobility to reduce fall risk  Reasons for discharge: treatment goals met  Patient/family agrees with progress made and goals achieved: Yes  PT Discharge Precautions/Restrictions Precautions Precautions: Fall Restrictions Weight Bearing Restrictions: No Pain Interference Pain Interference Pain Effect on Sleep: 1. Rarely or not at all Pain Interference with Therapy Activities: 1. Rarely or not at all Pain Interference with Day-to-Day Activities: 1. Rarely or not at all Vision/Perception  Vision - History Ability to See in Adequate Light: 0 Adequate Vision - Assessment Eye Alignment: Within Functional Limits Ocular Range of Motion: Within Functional Limits Alignment/Gaze Preference: Within Defined Limits Tracking/Visual Pursuits: Able to track stimulus in all quads without difficulty Saccades: Within functional limits Perception Perception: Impaired Inattention/Neglect: Does not attend to left visual field Praxis Praxis: Impaired Praxis  Impairment Details: Motor planning Praxis-Other Comments: mild deficit  Cognition Overall Cognitive Status: Impaired/Different from baseline Arousal/Alertness: Awake/alert Orientation Level: Oriented X4 Attention: Sustained Sustained Attention: Appears intact Memory: Appears intact Awareness: Appears intact Problem Solving: Impaired Problem Solving Impairment: Functional complex Safety/Judgment: Appears intact Sensation Sensation Light Touch: Impaired Detail Peripheral sensation comments: hx of PN; Proprioception: Impaired Detail Coordination Gross Motor Movements are Fluid and Coordinated: No Fine Motor Movements are Fluid and Coordinated: No Finger Nose Finger Test: L impairments- ataxia Heel Shin Test: L with reduced accuracy, excursion and speed Motor  Motor Motor: Ataxia  Mobility Bed Mobility Bed Mobility: Supine to Sit;Sit to Supine Supine to Sit: Supervision/Verbal cueing Sit to Supine: Supervision/Verbal cueing Transfers Transfers: Sit to Stand;Stand to Sit;Stand Pivot Transfers Sit to Stand: Supervision/Verbal cueing Stand to Sit: Supervision/Verbal cueing Stand Pivot Transfers: Supervision/Verbal cueing Stand Pivot Transfer Details: Verbal cues for precautions/safety;Verbal cues for technique Locomotion  Gait Ambulation: Yes Gait Assistance: Supervision/Verbal cueing Gait Distance (Feet): 300 Feet Assistive device: Rolling walker Gait Assistance Details: Verbal cues for sequencing;Verbal cues for gait pattern;Verbal cues for technique Gait Gait: Yes Gait Pattern: Impaired Gait Pattern: Decreased stride length Gait velocity: decreased Stairs / Additional Locomotion Stairs: Yes Stairs Assistance: Contact Guard/Touching assist Stair Management Technique: Two rails Number of Stairs: 12 Height of Stairs: 6 Ramp: Contact Guard/touching assist (using RW) Curb: Contact Guard/Touching assist (using RW) Pick up small object from the floor assist level:  Supervision/Verbal cueing Wheelchair Mobility Wheelchair Mobility: No  Trunk/Postural Assessment  Cervical Assessment Cervical Assessment: Within Functional Limits Thoracic Assessment Thoracic Assessment: Exceptions to [facility] (rounded shoulders) Lumbar Assessment Lumbar Assessment: Within Functional Limits Postural Control Postural Control: Deficits on evaluation Righting Reactions: delayed and inadequate  Balance Standardized Balance Assessment Standardized Balance Assessment: Berg Balance Test Berg Balance Test Sit to Stand: Able to stand without using hands and stabilize independently Standing Unsupported: Able to  stand safely 2 minutes Sitting with Back Unsupported but Feet Supported on Floor or Stool: Able to sit safely and securely 2 minutes Stand to Sit: Sits safely with minimal use of hands Transfers: Able to transfer safely, minor use of hands Standing Unsupported with Eyes Closed: Able to stand 10 seconds safely Standing Ubsupported with Feet Together: Needs help to attain position but able to stand for 30 seconds with feet together From Standing, Reach Forward with Outstretched Arm: Reaches forward but needs supervision From Standing Position, Pick up Object from Floor: Able to pick up shoe, needs supervision From Standing Position, Turn to Look Behind Over each Shoulder: Turn sideways only but maintains balance Turn 360 Degrees: Needs assistance while turning Standing Unsupported, Alternately Place Feet on Step/Stool: Able to complete >2 steps/needs minimal assist Standing Unsupported, One Foot in Front: Able to take small step independently and hold 30 seconds Standing on One Leg: Tries to lift leg/unable to hold 3 seconds but remains standing independently Total Score: 35 Dynamic Sitting Balance Dynamic Sitting - Balance Support: Feet supported Dynamic Sitting - Level of Assistance: 6: Modified independent (Device/Increase time) Static Standing Balance Static Standing -  Balance Support: No upper extremity supported;During functional activity Static Standing - Level of Assistance: 5: Stand by assistance Dynamic Standing Balance Dynamic Standing - Balance Support: No upper extremity supported;During functional activity Dynamic Standing - Level of Assistance: 5: Stand by assistance Extremity Assessment  RLE Assessment RLE Assessment: Within Functional Limits LLE Assessment LLE Assessment: Exceptions to [facility] General Strength Comments: Grossly 4+/5  [place] Desanctis, PT, DPT Doreene Burke PT, DPT 03/26/2022, 7:07 PM

## 2022-03-26 NOTE — Progress Notes (Signed)
PROGRESS NOTE   Subjective/Complaints:  Pt asking if needs to go home on Lovenox- asked her to speak with Dr Naaman Plummer. Eating breakfast- no issues LBM overnight.    ROS:    Pt denies SOB, abd pain, CP, N/V/C/D, and vision changes  Objective:   No results found. No results for input(s): "WBC", "HGB", "HCT", "PLT" in the last 72 hours.  Recent Labs    03/24/22 0514  NA 138  K 3.9  CL 103  CO2 24  GLUCOSE 97  BUN 21  CREATININE 0.85  CALCIUM 8.8*     Intake/Output Summary (Last 24 hours) at 03/26/2022 1004 Last data filed at 03/26/2022 0747 Gross per 24 hour  Intake 960 ml  Output --  Net 960 ml        Physical Exam: Vital Signs Blood pressure 102/90, pulse 75, temperature 98.5 F (36.9 C), temperature source Oral, resp. rate 18, height '5\' 4"'$  (1.626 m), weight 63.4 kg, SpO2 98 %.     General: awake, alert, appropriate, sitting up eating breakfast- ~ 25% gone so far;  NAD HENT: conjugate gaze; oropharynx moist CV: regular rate; no JVD Pulmonary: CTA B/L; no W/R/R- good air movement GI: soft, NT, ND, (+)BS Psychiatric: appropriate Neurological: Ox3- slightly delayed responses  Skin: surgical incision CDI. Small piece of suture still exposed- don't see suture, but has glue and a few scabs- hair really matted Neurologic: Cranial nerves II through XII intact, motor strength is 5/5 in RIght and 4+/5 left deltoid, bicep, tricep, grip, hip flexor, knee extensors, ankle dorsiflexor and plantar flexor Sensory exam equal LT in BUE  Musculoskeletal: Full range of motion in all 4 extremities. No joint swelling    Assessment/Plan: 1. Functional deficits which require 3+ hours per day of interdisciplinary therapy in a comprehensive inpatient rehab setting. Physiatrist is providing close team supervision and 24 hour management of active medical problems listed below. Physiatrist and rehab team continue to assess  barriers to discharge/monitor patient progress toward functional and medical goals  Care Tool:  Bathing    Body parts bathed by patient: Right arm, Left arm, Chest, Abdomen, Front perineal area, Buttocks, Right upper leg, Left upper leg, Right lower leg, Left lower leg, Face   Body parts bathed by helper: Left lower leg, Right lower leg Body parts n/a: Buttocks   Bathing assist Assist Level: Minimal Assistance - Patient > 75%     Upper Body Dressing/Undressing Upper body dressing   What is the patient wearing?: Pull over shirt    Upper body assist Assist Level: Minimal Assistance - Patient > 75%    Lower Body Dressing/Undressing Lower body dressing      What is the patient wearing?: Pants, Incontinence brief     Lower body assist Assist for lower body dressing: Minimal Assistance - Patient > 75%     Toileting Toileting    Toileting assist Assist for toileting: Minimal Assistance - Patient > 75%     Transfers Chair/bed transfer  Transfers assist     Chair/bed transfer assist level: Minimal Assistance - Patient > 75%     Locomotion Ambulation   Ambulation assist      Assist level:  Minimal Assistance - Patient > 75% Assistive device: Walker-rolling Max distance: 100'   Walk 10 feet activity   Assist     Assist level: Minimal Assistance - Patient > 75% Assistive device: Walker-rolling   Walk 50 feet activity   Assist    Assist level: Minimal Assistance - Patient > 75% Assistive device: Walker-rolling    Walk 150 feet activity   Assist    Assist level: Moderate Assistance - Patient - 50 - 74% Assistive device: No Device    Walk 10 feet on uneven surface  activity   Assist Walk 10 feet on uneven surfaces activity did not occur: Safety/medical concerns (balance deficits)         Wheelchair     Assist Is the patient using a wheelchair?: Yes Type of Wheelchair: Manual    Wheelchair assist level: Maximal Assistance - Patient  25 - 49% Max wheelchair distance: 50    Wheelchair 50 feet with 2 turns activity    Assist        Assist Level: Maximal Assistance - Patient 25 - 49%   Wheelchair 150 feet activity     Assist  Wheelchair 150 feet activity did not occur:  (L hand inattention)   Assist Level: Maximal Assistance - Patient 25 - 49% (per PT evaluation Documentation)   Blood pressure 102/90, pulse 75, temperature 98.5 F (36.9 C), temperature source Oral, resp. rate 18, height '5\' 4"'$  (1.626 m), weight 63.4 kg, SpO2 98 %.  Medical Problem List and Plan: 1. 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  Status post right parietal craniotomy resection of brain tumor 03/11/2022.  Patient is to follow-up outpatient oncology services Dr.Gudena             -patient may shower cover incision             -ELOS/Goals: 7/3/2-- Sup with PT/OT/SLP   Con't CIR- PT and OT and SLP 2.  Antithrombotics: -DVT/anticoagulation:  Pharmaceutical: Lovenox initiated 03/12/2022  7/1-7/2- pt asking if will go home on lovenox- asked her to speak with Dr Naaman Plummer about this.              -antiplatelet therapy: N/A 3. Pain Management: Hydrocodone as needed  7/2- just sore in head, no other pain- con't regimen prn 4. Mood/Sleep: Provide emotional support             -antipsychotic agents: N/A 5. Neuropsych/cognition: This patient is capable of making decisions on her own behalf. 6. Skin/Wound Care: Routine skin checks,  7. Fluids/Electrolytes/Nutrition:  -BUN improved to 21 6/30  -continue to push fluids  -recheck labs Monday   8.  Seizure prophylaxis.  EEG negative although questionable seizure at time of acute admission -continue Keppra 500 mg twice daily -no seizures since rehab admit--continue keppra at discharge 9.  Hyperlipidemia.  Crestor '10mg'$  10.  Hypothyroidism.  Synthroid 50 mcg dialy 11.  Anemia.  Continue iron supplement. Last HGB 9.1  Follow-up CBC  Monday 12.  History of tobacco use.  Provide counseling 13.  History of peripheral neuropathy.  Patient has been seen by Dr. Krista Blue in the past. 14. ? carpal tunnel L wrist. She reports she may have been told this in the past.   - night time use wrist splint could be beneficial--hold off at present 15. Leukocytosis, no signs of infection at this time, likely due to prior Decadron, monitor  -6/26- wbc's down to 10k--f/u monday  16. Hair matted  7/1- explained can comb hair now- but be careful of incision since has little scabs- should be able to go to beauty salon in 1 week from d/c- and should be able to wash hair if careful this weekend  7/2- hasn't gotten hair combed yet- but plans when daughter comes in .  LOS: 9 days A FACE TO FACE EVALUATION WAS PERFORMED  Tabitha Ramirez 03/26/2022, 10:04 AM

## 2022-03-27 DIAGNOSIS — R7989 Other specified abnormal findings of blood chemistry: Secondary | ICD-10-CM | POA: Diagnosis not present

## 2022-03-27 DIAGNOSIS — D496 Neoplasm of unspecified behavior of brain: Secondary | ICD-10-CM | POA: Diagnosis not present

## 2022-03-27 LAB — CBC
HCT: 27.7 % — ABNORMAL LOW (ref 36.0–46.0)
Hemoglobin: 9.1 g/dL — ABNORMAL LOW (ref 12.0–15.0)
MCH: 33.7 pg (ref 26.0–34.0)
MCHC: 32.9 g/dL (ref 30.0–36.0)
MCV: 102.6 fL — ABNORMAL HIGH (ref 80.0–100.0)
Platelets: 294 10*3/uL (ref 150–400)
RBC: 2.7 MIL/uL — ABNORMAL LOW (ref 3.87–5.11)
RDW: 15.3 % (ref 11.5–15.5)
WBC: 7.4 10*3/uL (ref 4.0–10.5)
nRBC: 0 % (ref 0.0–0.2)

## 2022-03-27 LAB — BASIC METABOLIC PANEL
Anion gap: 9 (ref 5–15)
BUN: 24 mg/dL — ABNORMAL HIGH (ref 8–23)
CO2: 25 mmol/L (ref 22–32)
Calcium: 9 mg/dL (ref 8.9–10.3)
Chloride: 105 mmol/L (ref 98–111)
Creatinine, Ser: 1.03 mg/dL — ABNORMAL HIGH (ref 0.44–1.00)
GFR, Estimated: 57 mL/min — ABNORMAL LOW (ref >60)
Glucose, Bld: 94 mg/dL (ref 70–99)
Potassium: 4 mmol/L (ref 3.5–5.1)
Sodium: 139 mmol/L (ref 135–145)

## 2022-03-27 MED ORDER — ROSUVASTATIN CALCIUM 10 MG PO TABS: 10.0000 mg | ORAL_TABLET | Freq: Every day | ORAL | 0 refills | Status: DC

## 2022-03-27 MED ORDER — FERROUS SULFATE 325 (65 FE) MG PO TABS: 325.0000 mg | ORAL_TABLET | Freq: Every day | ORAL | 3 refills | Status: DC

## 2022-03-27 MED ORDER — ACETAMINOPHEN 325 MG PO TABS: 325.0000 mg | ORAL_TABLET | ORAL | Status: DC | PRN

## 2022-03-27 MED ORDER — LEVETIRACETAM 500 MG PO TABS: 500.0000 mg | ORAL_TABLET | Freq: Two times a day (BID) | ORAL | 0 refills | Status: DC

## 2022-03-27 MED ORDER — VITAMIN B-12 1000 MCG PO TABS: 1000.0000 ug | ORAL_TABLET | Freq: Every day | ORAL | 0 refills | Status: DC

## 2022-03-27 MED ORDER — D3-1000 25 MCG (1000 UT) PO CAPS: 1000.0000 [IU] | ORAL_CAPSULE | Freq: Every day | ORAL | 0 refills | Status: DC

## 2022-03-27 MED ORDER — HYDROCODONE-ACETAMINOPHEN 5-325 MG PO TABS: 1.0000 | ORAL_TABLET | ORAL | 0 refills | Status: DC | PRN

## 2022-03-27 MED ORDER — LEVOTHYROXINE SODIUM 50 MCG PO TABS: 50.0000 ug | ORAL_TABLET | Freq: Every day | ORAL | 0 refills | Status: DC

## 2022-03-27 NOTE — Progress Notes (Signed)
Pt discharged with personal belongings. Pt transferred to car without complications.

## 2022-03-27 NOTE — Progress Notes (Addendum)
Inpatient Rehabilitation Care Coordinator Discharge Note   Patient Details  Name: Tabitha Ramirez MRN: [id number] Date of Birth: Jan 02, 1946   Discharge location: Home  Length of Stay: 10 DAYS  Discharge activity level: SUPERIVSION-MIN ASSIST  Home/community participation: ACTIVE  Patient response XT:KWIOXB Literacy - How often do you need to have someone help you when you read instructions, pamphlets, or other written material from your doctor or pharmacy?: Never  Patient response DZ:HGDJME Isolation - How often do you feel lonely or isolated from those around you?: Never  Services provided included: MD, RD, PT, OT, SLP, RN, CM, TR, Pharmacy, SW  Financial Services:  Charity fundraiser Utilized: [place] offered to/list presented to: PT AND DAUGHTER  Follow-up services arranged:  Outpatient    Outpatient Servicies: [facility] OT SP WILL CONTACT DAUGHTER TO SET UP FOLLOW UP APPOINTMENTS  ADDED 3 IN 1 TO DELIVER HOME DUE TO LATE NOTICE    Patient response to transportation need: Is the patient able to respond to transportation needs?: Yes In the past 12 months, has lack of transportation kept you from medical appointments or from getting medications?: No In the past 12 months, has lack of transportation kept you from meetings, work, or from getting things needed for daily living?: No    Comments (or additional information): DAUGHTER WAS IN FOR EDUCATION BOTH COMFORTABLE WITH DC TODAY AND PT READY TO GO HOME  Patient/Family verbalized understanding of follow-up arrangements:  Yes  Individual responsible for coordination of the follow-up plan: [facility] [phone]  Confirmed correct DME delivered: Elease Hashimoto 03/27/2022    Sujey Gundry, Gardiner Rhyme

## 2022-03-27 NOTE — Progress Notes (Signed)
PROGRESS NOTE   Subjective/Complaints:  Pt asking if needs to go home on Lovenox- asked her to speak with Dr Naaman Plummer. Eating breakfast- no issues LBM overnight.    ROS:    Pt denies SOB, abd pain, CP, N/V/C/D, and vision changes  Objective:   No results found. Recent Labs    03/27/22 0552  WBC 7.4  HGB 9.1*  HCT 27.7*  PLT 294    Recent Labs    03/27/22 0552  NA 139  K 4.0  CL 105  CO2 25  GLUCOSE 94  BUN 24*  CREATININE 1.03*  CALCIUM 9.0     Intake/Output Summary (Last 24 hours) at 03/27/2022 2831 Last data filed at 03/26/2022 2100 Gross per 24 hour  Intake 960 ml  Output --  Net 960 ml        Physical Exam: Vital Signs Blood pressure 122/78, pulse 71, temperature 97.8 F (36.6 C), temperature source Oral, resp. rate 18, height '5\' 4"'$  (1.626 m), weight 63.4 kg, SpO2 100 %.     Constitutional: No distress . Vital signs reviewed. HEENT: NCAT, EOMI, oral membranes moist Neck: supple Cardiovascular: RRR without murmur. No JVD    Respiratory/Chest: CTA Bilaterally without wheezes or rales. Normal effort    GI/Abdomen: BS +, non-tender, non-distended Ext: no clubbing, cyanosis, or edema Psych: pleasant and cooperative  Skin: surgical incision CDI. Small piece of suture still exposed. Hair sl matted Neurologic: Cranial nerves II through XII intact, motor strength is 5/5 in RIght and 4+/5 left deltoid, bicep, tricep, grip, hip flexor, knee extensors, ankle dorsiflexor and plantar flexor--stable exam Sensory exam equal LT in BUE  Musculoskeletal: Full range of motion in all 4 extremities. No joint swelling    Assessment/Plan: 1. Functional deficits which require 3+ hours per day of interdisciplinary therapy in a comprehensive inpatient rehab setting. Physiatrist is providing close team supervision and 24 hour management of active medical problems listed below. Physiatrist and rehab team continue  to assess barriers to discharge/monitor patient progress toward functional and medical goals  Care Tool:  Bathing    Body parts bathed by patient: Right arm, Left arm, Chest, Abdomen, Front perineal area, Right upper leg, Left upper leg, Right lower leg, Left lower leg, Face, Buttocks   Body parts bathed by helper: Left lower leg, Right lower leg Body parts n/a: Buttocks   Bathing assist Assist Level: Contact Guard/Touching assist     Upper Body Dressing/Undressing Upper body dressing   What is the patient wearing?: Pull over shirt    Upper body assist Assist Level: Supervision/Verbal cueing    Lower Body Dressing/Undressing Lower body dressing      What is the patient wearing?: Pants, Incontinence brief     Lower body assist Assist for lower body dressing: Minimal Assistance - Patient > 75%     Toileting Toileting    Toileting assist Assist for toileting: Supervision/Verbal cueing     Transfers Chair/bed transfer  Transfers assist     Chair/bed transfer assist level: Supervision/Verbal cueing Chair/bed transfer assistive device: Programmer, multimedia   Ambulation assist      Assist level: Supervision/Verbal cueing Assistive device: Walker-rolling Max distance:  300 ft   Walk 10 feet activity   Assist     Assist level: Supervision/Verbal cueing Assistive device: Walker-rolling   Walk 50 feet activity   Assist    Assist level: Supervision/Verbal cueing Assistive device: Walker-rolling    Walk 150 feet activity   Assist    Assist level: Supervision/Verbal cueing Assistive device: Walker-rolling    Walk 10 feet on uneven surface  activity   Assist Walk 10 feet on uneven surfaces activity did not occur: Safety/medical concerns (balance deficits)   Assist level: Contact Guard/Touching assist Assistive device: Walker-rolling   Wheelchair     Assist Is the patient using a wheelchair?: No Type of Wheelchair: Manual     Wheelchair assist level: Maximal Assistance - Patient 25 - 49% Max wheelchair distance: 50    Wheelchair 50 feet with 2 turns activity    Assist        Assist Level: Maximal Assistance - Patient 25 - 49%   Wheelchair 150 feet activity     Assist  Wheelchair 150 feet activity did not occur:  (L hand inattention)   Assist Level: Maximal Assistance - Patient 25 - 49% (per PT evaluation Documentation)   Blood pressure 122/78, pulse 71, temperature 97.8 F (36.6 C), temperature source Oral, resp. rate 18, height '5\' 4"'$  (1.626 m), weight 63.4 kg, SpO2 100 %.  Medical Problem List and Plan: 1. 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  Status post right parietal craniotomy resection of brain tumor 03/11/2022.  Patient is to follow-up outpatient oncology services [place]             -dc home today  -f/u with NS, onc,[facility], among others 2.  Antithrombotics: -DVT/anticoagulation:  will NOT dc home on lovenox             -antiplatelet therapy: N/A 3. Pain Management: Hydrocodone as needed  7/2- just sore in head, no other pain- con't regimen prn 4. Mood/Sleep: Provide emotional support             -antipsychotic agents: N/A 5. Neuropsych/cognition: This patient is capable of making decisions on her own behalf. 6. Skin/Wound Care: Routine skin checks,  7. Fluids/Electrolytes/Nutrition:  -7/3 BUN 24 today  -needs to continue to push fluids     8.  Seizure prophylaxis.  EEG negative although questionable seizure at time of acute admission -continue Keppra 500 mg twice daily -no seizures since rehab admit--continue keppra at discharge 9.  Hyperlipidemia.  Crestor '10mg'$  10.  Hypothyroidism.  Synthroid 50 mcg dialy 11.  Anemia.  Continue iron supplement. Last HGB 9.1  Follow-up CBC Monday 12.  History of tobacco use.  Provide counseling 13.  History of peripheral neuropathy.  Patient has been seen by Dr. Krista Blue in the  past. 14. ? carpal tunnel L wrist. She reports she may have been told this in the past.   - wrist splint may be an option. 15. Leukocytosis, no signs of infection at this time, likely due to prior Decadron, monitor  7/3 - wbc's down to 7.4    LOS: 10 days A FACE TO FACE EVALUATION WAS PERFORMED  Tabitha Ramirez 03/27/2022, 9:38 AM

## 2022-03-27 NOTE — Progress Notes (Signed)
Patient appears to be resting comfortable during shift, verbalized no c/o pain, Consume po liquids and was assisted to BR x2..Anticipate discharge home today, and in good spirits, call bell within reach, bed alarm monitoring continue and set on low monitoring, Continue regime.

## 2022-03-27 NOTE — Progress Notes (Signed)
Inpatient Rehabilitation Discharge Medication Review by a Pharmacist  A complete drug regimen review was completed for this patient to identify any potential clinically significant medication issues.  High Risk Drug Classes Is patient taking? Indication by Medication  Antipsychotic No   Anticoagulant No   Antibiotic No   Opioid Yes Norco- acute pain  Antiplatelet No   Hypoglycemics/insulin No   Vasoactive Medication No   Chemotherapy No   Other Yes Crestor- HLD Keppra- seizure prophylaxis Synthroid- hypothyroidism Iron, B-12- anemia Vitamin D-supplement     Type of Medication Issue Identified Description of Issue Recommendation(s)  Drug Interaction(s) (clinically significant)     Duplicate Therapy     Allergy     No Medication Administration End Date     Incorrect Dose     Additional Drug Therapy Needed     Significant med changes from prior encounter (inform family/care partners about these prior to discharge).    Other  PTA meds: Klor-Con 10 meq daily Maxzide-25 take 1/2 tab every other day Restart PTA meds if warranted at time of CIR discharge    Clinically significant medication issues were identified that warrant physician communication and completion of prescribed/recommended actions by midnight of the next day:  No  Time spent performing this drug regimen review (minutes):  30  Thank you for involving pharmacy in this patient's care.  Renold Genta, PharmD, BCPS Clinical Pharmacist Clinical phone for 03/27/2022 until 3p is [phone] 03/27/2022 8:27 AM

## 2022-04-04 ENCOUNTER — Inpatient Hospital Stay: Payer: Medicare PPO | Attending: Hematology and Oncology | Admitting: Hematology and Oncology

## 2022-04-04 ENCOUNTER — Other Ambulatory Visit: Payer: Self-pay

## 2022-04-04 DIAGNOSIS — Z8701 Personal history of pneumonia (recurrent): Secondary | ICD-10-CM | POA: Diagnosis not present

## 2022-04-04 DIAGNOSIS — R531 Weakness: Secondary | ICD-10-CM | POA: Insufficient documentation

## 2022-04-04 DIAGNOSIS — G8929 Other chronic pain: Secondary | ICD-10-CM | POA: Insufficient documentation

## 2022-04-04 DIAGNOSIS — C50111 Malignant neoplasm of central portion of right female breast: Secondary | ICD-10-CM | POA: Insufficient documentation

## 2022-04-04 DIAGNOSIS — C7931 Secondary malignant neoplasm of brain: Secondary | ICD-10-CM | POA: Diagnosis not present

## 2022-04-04 DIAGNOSIS — Z171 Estrogen receptor negative status [ER-]: Secondary | ICD-10-CM | POA: Insufficient documentation

## 2022-04-04 DIAGNOSIS — G629 Polyneuropathy, unspecified: Secondary | ICD-10-CM | POA: Insufficient documentation

## 2022-04-04 MED ORDER — ANASTROZOLE 1 MG PO TABS: 1.0000 mg | ORAL_TABLET | Freq: Every day | ORAL | 3 refills | Status: DC

## 2022-04-04 MED ORDER — OXYCODONE-ACETAMINOPHEN 5-325 MG PO TABS: 1.0000 | ORAL_TABLET | Freq: Three times a day (TID) | ORAL | 0 refills | Status: DC | PRN

## 2022-04-04 NOTE — Assessment & Plan Note (Signed)
1. 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 N0 M0 ER PR 0% HER-2 negative status post radiation therapy  2. brain metastases: 03/09/2022: 3 x 2.3 x 1.8 cm irregular mass in the right parietal lobe with localized vasogenic edema status postcraniotomy 03/11/2022 and resection: Positive for known breast cancer (positive for CK7 and GATA 3 and negative for CK20) ER/PR negative, HER2 negative (score 0)  Breast Cancer Surveillance: 1. CT CAP 03/09/2022: No evidence of metastatic disease 2.  MRI brain 03/12/2022: Postoperative changes from right parietal craniotomy, gross total resection of the tumor no residual tumor, small acute ischemic infarct deep in the resection cavity.  Chronic pain related to the breast surgery and neuropathy:    April 2019 at [facility]: Hospitalization for pneumonia and lower extremity paralysis   Recommendation: 1.  Radiation therapy 2. followed by surveillance.  Since the CT chest abdomen pelvis does not show any evidence of metastatic disease, I do not recommend systemic chemotherapy at this time.

## 2022-04-04 NOTE — Progress Notes (Signed)
Patient Care Team: [place] [place] [facility] as PCP - General (Family Medicine) Magrinat, Virgie Dad, MD (Inactive) as Consulting Physician (Oncology)  DIAGNOSIS:  Encounter Diagnosis  Name Primary?   Malignant neoplasm of central portion of right breast in female, estrogen receptor negative ([place])     SUMMARY OF ONCOLOGIC HISTORY: Oncology History  Cancer of central portion of right female breast ([place])  01/31/2013 Mammogram   Area of distortion upper-outer quadrant right breast diffuse calcifications in both breasts 2.5 cm with prominent right axillary lymph node 2.5 cm   01/31/2013 Initial Diagnosis   Cancer of central portion of female breast: 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 Ki-67 84%; sentinel lymph node sampling 03/10/2013 showed1/2 SLN pos PR 3% PR 6% Ki-67 15% HER-2 negative   02/10/2013 Breast MRI   Right breast: Rim enhancing necrotic mass 3.4 cm with a 2 cm level I axillary lymph node   03/17/2013 - 08/25/2013 Neo-Adjuvant Chemotherapy   Dose dense Adriamycin and Cytoxan Followed by weekly Taxol and carboplatin x12; chemotherapy-induced anemia and peripheral neuropathy were the complications   11/29/1060 Surgery   Right breast lumpectomy scatter residual invasive mass 1.1 cm with [facility] [address] 0/11 lymph nodes no cancer seen: ER 0% PR 0% insufficient to mark it on HER-2 testing   11/12/2013 - 12/24/2013 Radiation Therapy   Radiation therapy to the breast and axilla     CHIEF COMPLIANT: Follow-up of right breast cancer   INTERVAL HISTORY: Tabitha Ramirez is a  76 y.o. with above-mentioned history of right breast cancer. currently on surveillance. She presents to the clinic today for annual follow-up. She states that she had some general weakness. Speech is ok and she is swallowing ok. She had some time with retaining information after surgery. Pain is about a 7 or 8 in the head.   ALLERGIES:  is allergic to nsaids and penicillins.  MEDICATIONS:  Current Outpatient  Medications  Medication Sig Dispense Refill   anastrozole (ARIMIDEX) 1 MG tablet Take 1 tablet (1 mg total) by mouth daily. 90 tablet 3   oxyCODONE-acetaminophen (PERCOCET/ROXICET) 5-325 MG tablet Take 1 tablet by mouth every 8 (eight) hours as needed for severe pain. 30 tablet 0   acetaminophen (TYLENOL) 325 MG tablet Take 1-2 tablets (325-650 mg total) by mouth every 4 (four) hours as needed for mild pain.     Cholecalciferol (D3-1000) 25 MCG (1000 UT) capsule Take 1 capsule (1,000 Units total) by mouth daily. 30 capsule 0   ferrous sulfate 325 (65 FE) MG tablet Take 1 tablet (325 mg total) by mouth daily with breakfast. 30 tablet 3   levETIRAcetam (KEPPRA) 500 MG tablet Take 1 tablet (500 mg total) by mouth 2 (two) times daily. 60 tablet 0   levothyroxine (SYNTHROID) 50 MCG tablet Take 1 tablet (50 mcg total) by mouth daily. 30 tablet 0   rosuvastatin (CRESTOR) 10 MG tablet Take 1 tablet (10 mg total) by mouth daily. 30 tablet 0   vitamin B-12 (CYANOCOBALAMIN) 1000 MCG tablet Take 1 tablet (1,000 mcg total) by mouth daily. 30 tablet 0   No current facility-administered medications for this visit.    PHYSICAL EXAMINATION: ECOG PERFORMANCE STATUS: 1 - Symptomatic but completely ambulatory  Vitals:   04/04/22 1346  BP: 134/85  Pulse: 83  Resp: 18  Temp: 97.9 F (36.6 C)  SpO2: 100%   Filed Weights   04/04/22 1346  Weight: 141 lb 3.2 oz (64 kg)   LABORATORY DATA:  I have reviewed the  data as listed    Latest Ref Rng & Units 03/27/2022    5:52 AM 03/24/2022    5:14 AM 03/20/2022    6:23 AM  CMP  Glucose 70 - 99 mg/dL 94  97  104   BUN 8 - 23 mg/dL 24  21  27    Creatinine 0.44 - 1.00 mg/dL 1.03  0.85  0.95   Sodium 135 - 145 mmol/L 139  138  134   Potassium 3.5 - 5.1 mmol/L 4.0  3.9  3.9   Chloride 98 - 111 mmol/L 105  103  103   CO2 22 - 32 mmol/L 25  24  26    Calcium 8.9 - 10.3 mg/dL 9.0  8.8  8.4   Total Protein 6.5 - 8.1 g/dL   6.2   Total Bilirubin 0.3 - 1.2 mg/dL   0.5    Alkaline Phos 38 - 126 U/L   59   AST 15 - 41 U/L   28   ALT 0 - 44 U/L   42     Lab Results  Component Value Date   WBC 7.4 03/27/2022   HGB 9.1 (L) 03/27/2022   HCT 27.7 (L) 03/27/2022   MCV 102.6 (H) 03/27/2022   PLT 294 03/27/2022   NEUTROABS 6.4 03/20/2022    ASSESSMENT & PLAN:  Cancer of central portion of right female breast ([place]) 1. 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 N0 M0 ER PR 0% HER-2 negative status post radiation therapy   2. brain metastases: 03/09/2022: 3 x 2.3 x 1.8 cm irregular mass in the right parietal lobe with localized vasogenic edema status postcraniotomy 03/11/2022 and resection: Positive for known breast cancer (positive for CK7 and GATA 3 and negative for CK20) ER/PR negative, HER2 negative (score 0)   Breast Cancer Surveillance: 1. CT CAP 03/09/2022: No evidence of metastatic disease 2.  MRI brain 03/12/2022: Postoperative changes from right parietal craniotomy, gross total resection of the tumor no residual tumor, small acute ischemic infarct deep in the resection cavity.   Chronic pain related to the breast surgery and neuropathy:     April 2019 at [facility]: Hospitalization for pneumonia and lower extremity paralysis    Recommendation: 1.  Radiation therapy 2. followed by surveillance.  Since the CT chest abdomen pelvis does not show any evidence of metastatic disease, I do not recommend systemic chemotherapy at this time.  CT CAP q 6 months Return to clinic after radiation is completed  No orders of the defined types were placed in this encounter.  The patient has a good understanding of the overall plan. she agrees with it. she will call with any problems that may develop before the next visit here. Total time spent: 30 mins including face to face time and time spent for planning, charting  and co-ordination of care   Harriette Ohara, MD 04/04/22  I Gardiner Coins am scribing for Dr. Lindi Adie  I have reviewed the above documentation for accuracy and completeness, and I agree with the above.

## 2022-04-05 ENCOUNTER — Other Ambulatory Visit: Payer: Self-pay | Admitting: Radiation Therapy

## 2022-04-05 ENCOUNTER — Telehealth: Payer: Self-pay

## 2022-04-05 DIAGNOSIS — C7931 Secondary malignant neoplasm of brain: Secondary | ICD-10-CM

## 2022-04-05 NOTE — Telephone Encounter (Signed)
-----   Message from Peggyann Shoals sent at 04/05/2022  8:34 AM EDT ----- Regarding: postop question Contact: [phone]  cranitomy tumor excision on 03/11/2022 [name] pt's daughter calling Can she get her hair done at the beauty salon yet?

## 2022-04-05 NOTE — Telephone Encounter (Signed)
Patients daughter has been notified and verbalized understanding.

## 2022-04-07 NOTE — Therapy (Unsigned)
OUTPATIENT SPEECH LANGUAGE PATHOLOGY EVALUATION   Patient Name: Tabitha Ramirez MRN: [id number] DOB:1946/07/12, 76 y.o., female Today's Date: 04/11/2022  PCP: [facility] PROVIDER: Cathlyn Parsons, PA-C    End of Session - 04/11/22 1215     Visit Number 1    Number of Visits 17    Date for SLP Re-Evaluation 06/06/22    Authorization Type [employer]    Progress Note Due on Visit 10    SLP Start Time 1009    SLP Stop Time  1100    SLP Time Calculation (min) 51 min    Activity Tolerance Patient tolerated treatment well             Past Medical History:  Diagnosis Date   Allergy    Anemia    Arthritis    "joints" (09/29/2013)   Breast cancer ([place])    GERD (gastroesophageal reflux disease)    Heart murmur    History of blood transfusion    "related to chemo/breast cancer" (09/29/2013)   Hypertension    Migraines    Personal history of chemotherapy    Personal history of radiation therapy    Radiation 11/05/13-12/24/13   Right breast    TMJ (temporomandibular joint syndrome)    "left; just dx'd" (09/29/2013   Past Surgical History:  Procedure Laterality Date   AXILLARY LYMPH NODE BIOPSY Right 03/10/2013   Procedure: AXILLARY LYMPH NODE BIOPSY;  Surgeon: Adin Hector, MD;  Location: [place];  Service: General;  Laterality: Right;  sentinel node with blue dye   BREAST BIOPSY     BREAST LUMPECTOMY Right 09/29/2013   needle localization w/axillary LND/notes 09/29/2013   BREAST LUMPECTOMY WITH NEEDLE LOCALIZATION AND AXILLARY LYMPH NODE DISSECTION Right 09/29/2013   Procedure: RIGHT BREAST NEEDLE LOCALIZED LUMPECTOMY AND AXILLARY LYMPH NODE DISSECTION;  Surgeon: Adin Hector, MD;  Location: [place];  Service: General;  Laterality: Right;   COLONOSCOPY N/A 04/30/2013   Procedure: COLONOSCOPY;  Surgeon: Wonda Horner, MD;  [facility];  Service: Endoscopy;  Laterality: N/A;   CRANIOTOMY Right 03/11/2022   Procedure: CRANIOTOMY TUMOR EXCISION;   Surgeon: Meade Maw, MD;  Location: [facility];  Service: Neurosurgery;  Laterality: Right;   ESOPHAGOGASTRODUODENOSCOPY N/A 04/28/2013   Procedure: ESOPHAGOGASTRODUODENOSCOPY (EGD);  Surgeon: Wonda Horner, MD;  Location: [facility];  Service: Endoscopy;  Laterality: N/A;   MASTECTOMY Right 09/29/2013   "partial"   PORT-A-CATH REMOVAL  09/29/2013   PORT-A-CATH REMOVAL Left 09/29/2013   Procedure: REMOVAL PORT-A-CATH;  Surgeon: Adin Hector, MD;  Location: [place];  Service: General;  Laterality: Left;   PORTACATH PLACEMENT N/A 03/10/2013   Procedure: INSERTION PORT-A-CATH WITH FLUOROSCOPY AND ULTRASOUND;  Surgeon: Adin Hector, MD;  Location: [place];  Service: General;  Laterality: N/A;   SURAL NERVE BX Right 05/13/2014   Procedure: SURAL NERVE BIOPSY;  Surgeon: Hosie Spangle, MD;  Location: [facility];  Service: Neurosurgery;  Laterality: Right;  Right sural nerve biopsy   TONSILLECTOMY     TOTAL ABDOMINAL HYSTERECTOMY     With bilateral salpingo-oophorectomy   Patient Active Problem List   Diagnosis Date Noted   Brain tumor ([place]) 03/17/2022   Palliative care encounter    Brain mass 03/09/2022   Seizure ([place]) 03/09/2022   Neuropathy 06/11/2014   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   Gait abnormality 01/26/2014   Unspecified hereditary and idiopathic peripheral neuropathy 01/26/2014   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[place]) 01/20/2014  Neuropathic pain 01/20/2014   Anxiety 01/20/2014   Unspecified deficiency anemia 01/20/2014   Hx of neutropenia 08/04/2013   Jaw pain 08/04/2013   Leukocytosis 04/27/2013   Pericardial effusion 04/27/2013   Cancer of central portion of right female breast ([place]) 02/04/2013   Need for influenza vaccination 11/29/2011   Hypertriglyceridemia 03/07/2011   Post-menopausal 03/07/2011   Anemia 12/17/2010   Heart murmur 12/17/2010   Seasonal allergies 12/17/2010   HYPOKALEMIA 02/19/2009   Essential hypertension, benign  04/09/2008    ONSET DATE: 03-09-2022   REFERRING DIAG: D49.6 (ICD-10-CM) - Brain tumor ([place])   THERAPY DIAG:  Cognitive communication deficit  Rationale for Evaluation and Treatment Rehabilitation  SUBJECTIVE:   SUBJECTIVE STATEMENT: "I think I'm getting better day by day" Pt accompanied by: family member, daughter Tabitha Ramirez  PERTINENT HISTORY: She lives with her daughter and son-in-law.  Presented to [facility]/09/2022 with headache, altered mental status, hand shaking, left facial droop as well as left arm and leg drift with suspect seizure. She was loaded with Keppra in the ED. CT/MRI of the brain showed a 3.0 x 2.3 x 1.8 cm irregular enhancing mass involving the right parietal lobe with localized vasogenic edema.  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  EEG did show mild to moderate diffuse encephalopathy and negative for seizure. Patient went right parietal craniotomy for resection of brain tumor 03/11/2022 per Dr. Elayne Guerin. 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  CT chest abdomen pelvis no evidence of disease.  Follow-up oncology services Dr.Archana Janese Banks and initially placed on steroid taper since completed and patient would follow-up on discharge with Dr. Lindi Adie.  She is tolerating a regular diet.  She denies pain at the incision site. Reports her strength is improving.  PAIN:  Are you having pain? No   FALLS: Has patient fallen in last 6 months?  See PT evaluation for details  LIVING ENVIRONMENT: Lives with: lives with their family Lives in: House/apartment  PLOF:  Level of assistance: Independent with IADLs Employment: Retired   PATIENT GOALS "independence"   OBJECTIVE:   DIAGNOSTIC FINDINGS: 03-12-22 MR BRAIN W WO CONTRAST IMPRESSION: 1. Postoperative changes from interval right parietal craniotomy for tumor resection. There has been gross total resection of the tumor, with no  definite residual tumor seen on this immediate postoperative examination. 2. Approximate 2 cm focus of restricted diffusion at the deep aspect of the resection cavity, consistent with a small acute ischemic infarct. 3. Previously seen surrounding diffusion signal abnormality within the adjacent right parieto-occipital cortex has resolved. Again, this was likely related to superimposed seizure on prior exam. 4. Persistent mild diffusion signal abnormality at the right thalamus, indeterminate, but similar to prior. Attention at follow-up recommended. 5. Acute right maxillary sinusitis.  COGNITION: Overall cognitive status: Impaired Areas of impairment:  Attention: Impaired: Selective, Alternating, Divided Memory: Impaired: Prospective Awareness: Impaired: Intellectual and Neglect Executive function: Impaired: Problem solving and Planning Functional deficits: Total-A for medication and financial management   COGNITIVE COMMUNICATION Auditory comprehension: WFL Verbal expression: WFL Functional communication: WFL Comments: reports no current concerns regarding engaging in conversations. This is also endorsed by pt's daughter.   ORAL MOTOR EXAMINATION Overall status: Did not assess  STANDARDIZED ASSESSMENTS: CLQT: Language: WNL To be completed next session. Evidenced cognitive deficits during completion of symbol trails, clock drawing, symbol cancellation. See clinical impression.    PATIENT REPORTED OUTCOME MEASURES (PROM): Cognitive Function: 121 "Somewhat": remembering to do things, memory/thinking as good as usual,  shifting attention, multitasking, handling interruptions, thinking clearly, keeping track of lists, mentally focusing   TODAY'S TREATMENT:  Initiated CLQT; Initiated education on attention and memory compensations to aid in daily functioning Education on SLP observations and recommendations, collaborative goal setting.    PATIENT EDUCATION: Education details:  see above Person educated: Patient and Caregiver daughter Education method: Customer service manager Education comprehension: verbalized understanding, returned demonstration, and needs further education  GOALS: Goals reviewed with patient? Yes  SHORT TERM GOALS: Target date: 05/09/2022  Pt will teach back attention and memory compensations and strategies to aid in increased independence at home given rare min-A.  Baseline: Goal status: INITIAL  2.  Pt will design bill pay system to aid in successful and accurate completion of all bills with occasional min-A over 2 sessions.  Baseline:  Goal status: INITIAL  3.  Pt will accurately sort medications, using targeted strategies, into weekly pill box with no more than 3 verbal cues.  Baseline:  Goal status: INITIAL  4.  Pt will teach back meta-cognitive strategies to aid in increased independence at home during execution of leisure tasks with rare min-A.  Baseline:  Goal status: INITIAL  5.  Pt will complete CLQT during first therapy session.   Baseline:  Goal Status: INITIAL   LONG TERM GOALS: Target date: 06/06/2022  Pt will report carryover of attention and memory compensations, with benefit, to aid in recall of prospective memory tasks over 1 week period.  Baseline:  Goal status: INITIAL  2.  Pt will carryover bill pay system to accurately pay all bills with distant supervision from daughter over 1 week period.  Baseline:  Goal status: INITIAL  3.  Pt and daughter will report successful increased independence from baseline of total-A for medication management, evidenced by no missed doses over 1 week period.  Baseline:  Goal status: INITIAL  4.  Pt will report 2 pt improvement via Cognitive Function PROM by d/c.  Baseline:  Goal status: INITIAL  ASSESSMENT:  CLINICAL IMPRESSION: Patient is a 76 y.o. F who was seen today for cognitive communication deficits s/p recitation of R parietal lobe brain tumor. Prior to  event, pt with full independence, lived alone, managed all aspects of home and schedule. Currently, daughter, son-in-law, and grandson are residing with pt to assist in rehabilitation. Overall, pt and daughter report pt is returning to baseline with improved processing and cognitive function. Some residual deviation from baseline reported in areas of attention, memory, and processing. Pt is requiring additional time to problem solve complex problems, decreased memory and retention of novel information, impaired prospective memory. Requires total-A for medication and financial management, desires return to previous status of full independence. SLP initiated CLQT, to be completed first therapy session. Pt demonstrates difficulty executing symbol trails task- using self talk to orient to directions throughout task. Difficulty planning evidenced, emergent awareness but lacks ability to problem solve solution. SLP suspects that amount of complex information presented was factor in abilities demonstrated during task. Planning impairment shown during clock drawing (spacing of numbers, size of hands, setting to correct time). Demonstrates intellectual awareness and is able to determine what she could correct following task with min-A from SLP. Performance of symbol trails and symbol cancellation appear impacted by L neglect . I recommend skilled ST to target cognitive deficits in effort to increase independence, reduce caregiver burden, and facilitate return to participation in desired tasks.    OBJECTIVE IMPAIRMENTS include attention, memory, awareness, and executive functioning. These impairments are limiting  patient from managing medications, managing appointments, managing finances, and household responsibilities. Factors affecting potential to achieve goals and functional outcome are medical prognosis.. Patient will benefit from skilled SLP services to address above impairments and improve overall function.  REHAB  POTENTIAL: Good  PLAN: SLP FREQUENCY: 2x/week  SLP DURATION: 8 weeks  PLANNED INTERVENTIONS: Cueing hierachy, Cognitive reorganization, Internal/external aids, Functional tasks, SLP instruction and feedback, Compensatory strategies, and Patient/family education    Su Monks, CCC-SLP 04/11/2022, 12:16 PM

## 2022-04-10 ENCOUNTER — Encounter: Payer: Medicare PPO | Admitting: Registered Nurse

## 2022-04-10 NOTE — Therapy (Unsigned)
OUTPATIENT OCCUPATIONAL THERAPY NEURO EVALUATION  Patient Name: Tabitha Ramirez MRN: [id number] DOB:02/26/1946, 76 y.o., female Today's Date: 04/11/2022  PCP: Rob Hickman primary care REFERRING PROVIDER: Dr. Naaman Plummer   OT End of Session - 04/11/22 0919     Visit Number 1    Number of Visits 25    Date for OT Re-Evaluation 07/04/22    Authorization Type [employer]    OT Start Time 0850    OT Stop Time 0925    OT Time Calculation (min) 35 min             Past Medical History:  Diagnosis Date   Allergy    Anemia    Arthritis    "joints" (09/29/2013)   Breast cancer ([place])    GERD (gastroesophageal reflux disease)    Heart murmur    History of blood transfusion    "related to chemo/breast cancer" (09/29/2013)   Hypertension    Migraines    Personal history of chemotherapy    Personal history of radiation therapy    Radiation 11/05/13-12/24/13   Right breast    TMJ (temporomandibular joint syndrome)    "left; just dx'd" (09/29/2013   Past Surgical History:  Procedure Laterality Date   AXILLARY LYMPH NODE BIOPSY Right 03/10/2013   Procedure: AXILLARY LYMPH NODE BIOPSY;  Surgeon: Adin Hector, MD;  Location: [place];  Service: General;  Laterality: Right;  sentinel node with blue dye   BREAST BIOPSY     BREAST LUMPECTOMY Right 09/29/2013   needle localization w/axillary LND/notes 09/29/2013   BREAST LUMPECTOMY WITH NEEDLE LOCALIZATION AND AXILLARY LYMPH NODE DISSECTION Right 09/29/2013   Procedure: RIGHT BREAST NEEDLE LOCALIZED LUMPECTOMY AND AXILLARY LYMPH NODE DISSECTION;  Surgeon: Adin Hector, MD;  Location: [place];  Service: General;  Laterality: Right;   COLONOSCOPY N/A 04/30/2013   Procedure: COLONOSCOPY;  Surgeon: Wonda Horner, MD;  [facility];  Service: Endoscopy;  Laterality: N/A;   CRANIOTOMY Right 03/11/2022   Procedure: CRANIOTOMY TUMOR EXCISION;  Surgeon: Meade Maw, MD;  Location: [facility];  Service: Neurosurgery;  Laterality: Right;    ESOPHAGOGASTRODUODENOSCOPY N/A 04/28/2013   Procedure: ESOPHAGOGASTRODUODENOSCOPY (EGD);  Surgeon: Wonda Horner, MD;  Location: [facility];  Service: Endoscopy;  Laterality: N/A;   MASTECTOMY Right 09/29/2013   "partial"   PORT-A-CATH REMOVAL  09/29/2013   PORT-A-CATH REMOVAL Left 09/29/2013   Procedure: REMOVAL PORT-A-CATH;  Surgeon: Adin Hector, MD;  Location: [place];  Service: General;  Laterality: Left;   PORTACATH PLACEMENT N/A 03/10/2013   Procedure: INSERTION PORT-A-CATH WITH FLUOROSCOPY AND ULTRASOUND;  Surgeon: Adin Hector, MD;  Location: [place];  Service: General;  Laterality: N/A;   SURAL NERVE BX Right 05/13/2014   Procedure: SURAL NERVE BIOPSY;  Surgeon: Hosie Spangle, MD;  Location: [facility];  Service: Neurosurgery;  Laterality: Right;  Right sural nerve biopsy   TONSILLECTOMY     TOTAL ABDOMINAL HYSTERECTOMY     With bilateral salpingo-oophorectomy   Patient Active Problem List   Diagnosis Date Noted   Brain tumor ([place]) 03/17/2022   Palliative care encounter    Brain mass 03/09/2022   Seizure ([place]) 03/09/2022   Neuropathy 06/11/2014   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   Gait abnormality 01/26/2014   Unspecified hereditary and idiopathic peripheral neuropathy 01/26/2014   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[place]) 01/20/2014   Neuropathic pain 01/20/2014   Anxiety 01/20/2014   Unspecified deficiency anemia 01/20/2014   Hx of neutropenia 08/04/2013   Jaw  pain 08/04/2013   Leukocytosis 04/27/2013   Pericardial effusion 04/27/2013   Cancer of central portion of right female breast ([place]) 02/04/2013   Need for influenza vaccination 11/29/2011   Hypertriglyceridemia 03/07/2011   Post-menopausal 03/07/2011   Anemia 12/17/2010   Heart murmur 12/17/2010   Seasonal allergies 12/17/2010   HYPOKALEMIA 02/19/2009   Essential hypertension, benign 04/09/2008    ONSET DATE: 03/11/22, craniotomy  REFERRING DIAG: D49.6 (ICD-10-CM) - Brain tumor  ([place])  THERAPY DIAG:  Other lack of coordination - Plan: Ot plan of care cert/re-cert  Muscle weakness (generalized) - Plan: Ot plan of care cert/re-cert  Neurologic neglect syndrome - Plan: Ot plan of care cert/re-cert  Other abnormalities of gait and mobility - Plan: Ot plan of care cert/re-cert  Other disturbances of skin sensation - Plan: Ot plan of care cert/re-cert  Rationale for Evaluation and Treatment Rehabilitation  SUBJECTIVE:   SUBJECTIVE STATEMENT: To be more independent Pt accompanied by: family member  PERTINENT HISTORY:  PMH significant for breast CA in remission for 7 years,  status post lobectomy, chemotherapy, radiation, hypertension, hypothyroidism, severe peripheral neuropathy status post IVIG, hx of seizure. 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 Pt was seen on CIR and d/c home 03/27/22  PRECAUTIONS: Fall  WEIGHT BEARING RESTRICTIONS No  PAIN:  Are you having pain? No  FALLS: Has patient fallen in last 6 months? No  LIVING ENVIRONMENT: Lives with: lives with their family, dtr and husband Lives in: House/apartment Stairs: Yes: External: 2 steps; rails Has following equipment at home: Walker - 2 wheeled  PLOF: Independent  PATIENT GOALS to be more independent  OBJECTIVE:   HAND DOMINANCE: Right  ADLs: Overall ADLs: Pt's dtr assists prn  Transfers/ambulation related to ADLs: Eating: needs assist with cutting food Grooming: mod I UB Dressing: min A  LB Dressing: supervision Toileting: mod I -distant supervision with walker Bathing: supervision, tub bench Tub Shower transfers: supervision Equipment: Transfer tub bench   IADLs: Shopping: family performs Light housekeeping: dependent Meal Prep: dependent Community mobility: supervision with a walker Medication management: dtr assists Financial management: family is handling Pt is not driving currently   MOBILITY STATUS:  ambulates with walker        UPPER EXTREMITY ROM    Bilateral UE A/ROM WFLS  (Blank rows = not tested)   UPPER EXTREMITY MMT:     MMT Right eval Left eval  Shoulder flexion 4+/5 4/5  Shoulder abduction    Shoulder adduction    Shoulder extension    Shoulder internal rotation    Shoulder external rotation    Middle trapezius    Lower trapezius    Elbow flexion 4+/5 4+/5  Elbow extension 4+/5 4/5  Wrist flexion    Wrist extension    Wrist ulnar deviation    Wrist radial deviation    Wrist pronation    Wrist supination    (Blank rows = not tested)  HAND FUNCTION: Grip strength: Right: NT lbs; Left: NT lbs  COORDINATION: 9 Hole Peg test: Right: 1 min 43 secs  Left: 7pegs in 2 min   Box and Blocks:  Right 37 blocks, Left  20 blocks  SENSATION: Light touch: Impaired  Hot/Cold: Impaired     COGNITION: Overall cognitive status:  decreased short term memory, difficulty concentrating/ maintaining attention, oriented to person, place and situation, recalls 3/3 words following short dealy, spells "WORLD" backwards  VISION: Subjective report: dtr reports pt misses items on L side Baseline vision: Wears glasses all  the time   VISION ASSESSMENT: Ocular ROM: WFL Visual Fields: per chart left visual field deficit,  pt's dtr reports L neglect Vision to be further assessed in a functional context  Patient has difficulty with following activities due to following visual impairments: decreased attention to left side during dressing per dtr report  PERCEPTION: Impaired: Inattention/neglect: does not attend to left side of body   TODAY'S TREATMENT:  N/a eval only   PATIENT EDUCATION: Education details: role of OT Person educated: Patient and Child(ren) Education method: Explanation Education comprehension: verbalized understanding   HOME EXERCISE PROGRAM: N/A    GOALS:   SHORT TERM GOALS: Target date:    I with initial HEP  Goal status: INITIAL  2.  Pt will perform dressing with supervision only.  Goal  status: INITIAL  3.  Pt will verbalize understanding of compensatory strategies for visual deficits/ left neglect and sensory deficits.  Goal status: INITIAL  4.  Pt will demonstrate improved bilateral UE functional use as evidenced by increasing box/ blocks score by 5 blocks bilaterally. Baseline: Box and Blocks:  Right 37 blocks, Left  20 blocks Goal status: INITIAL  5.  Pt will perform functional tabletop scanning activities with 90% or better accuracy  Goal status: INITIAL  6.  Assess grip strength and set goal prn  Goal status: INITIAL  LONG TERM GOALS: Target date: 07/04/22  I with updated HEP.  Goal status: INITIAL  2.  Pt will perform all basic ADLs mod I   Goal status: INITIAL  3.  Pt will perform basic home management with supervision demonstrating good safety awareness.  Goal status: INITIAL  4.  Pt will perform environmental scanning in a mod distracting environment with 90% accuracy and no LOB.  Goal status: INITIAL  5.  Pt will perform basic cooking task with min A demonstrating good safety awareness  Goal status: INITIAL  6.  Pt will demonstrate improved LUE fine motor coordination as evidenced by placing 9 pegs in 2 mins. Baseline: 7 pegs in 2 mins Goal status: INITIAL 7. Pt  will demonstrate improved fine motor coordination as evidenced by decreasing 9 hole peg test score to 90 secs or less  Baseline: 1 min 43 secs initial   Goal status: initial  ASSESSMENT:  CLINICAL IMPRESSION: Patient is a 76 y.o.  old female hospitalized with newly diagnosed lesion  in the right parietal lobe after presenting to the ED with hand shaking, confusion, and disorientation. Pt underwent resection of brain tumor on 03/11/22. PMH significant for breast CA in remission for 7 years,  status post lobectomy, chemotherapy, radiation, hypertension, hypothyroidism, severe peripheral neuropathy status post IVIG, hx of seizure.  Pt was seen on CIR and d/c home 03/27/22. Pt's dtr  and son-in law are staying with her.  PERFORMANCE DEFICITS in functional skills including ADLs, IADLs, coordination, dexterity, proprioception, sensation, strength, FMC, GMC, mobility, balance, endurance, decreased knowledge of precautions, decreased knowledge of use of DME, vision, and UE functional use, cognitive skills including attention, learn, memory, perception, problem solving, safety awareness, sequencing, thought, and understand, and psychosocial skills including coping strategies, environmental adaptation, habits, interpersonal interactions, and routines and behaviors.   IMPAIRMENTS are limiting patient from ADLs, IADLs, play, leisure, and social participation.   COMORBIDITIES may have co-morbidities  that affects occupational performance. Patient will benefit from skilled OT to address above impairments and improve overall function.  MODIFICATION OR ASSISTANCE TO COMPLETE EVALUATION: No modification of tasks or assist necessary to complete an evaluation.  OT OCCUPATIONAL PROFILE AND HISTORY: Detailed assessment: Review of records and additional review of physical, cognitive, psychosocial history related to current functional performance.  CLINICAL DECISION MAKING: LOW - limited treatment options, no task modification necessary  REHAB POTENTIAL: Good  EVALUATION COMPLEXITY: Low    PLAN: OT FREQUENCY: 2x/week  OT DURATION: 12 weeks plus eval  PLANNED INTERVENTIONS: self care/ADL training, therapeutic exercise, therapeutic activity, neuromuscular re-education, manual therapy, passive range of motion, gait training, balance training, functional mobility training, aquatic therapy, fluidotherapy, moist heat, cryotherapy, patient/family education, cognitive remediation/compensation, visual/perceptual remediation/compensation, energy conservation, coping strategies training, DME and/or AE instructions, and Re-evaluation  RECOMMENDED OTHER SERVICES: PT, ST  CONSULTED AND AGREED WITH  PLAN OF CARE: Patient and family member/caregiver  PLAN FOR NEXT SESSION: assess grip strength and set goal prn, further assess vision/ number cancellation task, education regarding vision compensations   Tabitha Ramirez, OT 04/11/2022, 1:48 PM Theone Murdoch, OTR/L [phone] Phone: [phone] 1:48 PM 04/11/22

## 2022-04-11 ENCOUNTER — Ambulatory Visit: Payer: Medicare PPO | Admitting: Occupational Therapy

## 2022-04-11 ENCOUNTER — Ambulatory Visit: Payer: Medicare PPO | Attending: Physician Assistant | Admitting: Physical Therapy

## 2022-04-11 ENCOUNTER — Encounter: Payer: Self-pay | Admitting: Occupational Therapy

## 2022-04-11 ENCOUNTER — Ambulatory Visit: Payer: Medicare PPO | Admitting: Speech Pathology

## 2022-04-11 DIAGNOSIS — R2689 Other abnormalities of gait and mobility: Secondary | ICD-10-CM | POA: Insufficient documentation

## 2022-04-11 DIAGNOSIS — R414 Neurologic neglect syndrome: Secondary | ICD-10-CM

## 2022-04-11 DIAGNOSIS — R41841 Cognitive communication deficit: Secondary | ICD-10-CM | POA: Insufficient documentation

## 2022-04-11 DIAGNOSIS — M6281 Muscle weakness (generalized): Secondary | ICD-10-CM | POA: Diagnosis present

## 2022-04-11 DIAGNOSIS — R2681 Unsteadiness on feet: Secondary | ICD-10-CM | POA: Diagnosis present

## 2022-04-11 DIAGNOSIS — R208 Other disturbances of skin sensation: Secondary | ICD-10-CM | POA: Diagnosis present

## 2022-04-11 DIAGNOSIS — D496 Neoplasm of unspecified behavior of brain: Secondary | ICD-10-CM | POA: Insufficient documentation

## 2022-04-11 DIAGNOSIS — R278 Other lack of coordination: Secondary | ICD-10-CM | POA: Insufficient documentation

## 2022-04-11 NOTE — Therapy (Signed)
OUTPATIENT PHYSICAL THERAPY NEURO EVALUATION   Patient Name: Tabitha Ramirez MRN: [id number] DOB:03/05/1946, 76 y.o., female Today's Date: 04/11/2022   PCP: [facility]  REFERRING PROVIDER: Cathlyn Parsons, PA-C    PT End of Session - 04/11/22 0935     Visit Number 1    Number of Visits 13   plus eval   Date for PT Re-Evaluation 05/30/22    Authorization Type [employer]    Progress Note Due on Visit 10    PT Start Time 0932    PT Stop Time 1011    PT Time Calculation (min) 39 min    Equipment Utilized During Treatment Gait belt    Activity Tolerance Patient tolerated treatment well    Behavior During Therapy WFL for tasks assessed/performed;Impulsive             Past Medical History:  Diagnosis Date   Allergy    Anemia    Arthritis    "joints" (09/29/2013)   Breast cancer ([place])    GERD (gastroesophageal reflux disease)    Heart murmur    History of blood transfusion    "related to chemo/breast cancer" (09/29/2013)   Hypertension    Migraines    Personal history of chemotherapy    Personal history of radiation therapy    Radiation 11/05/13-12/24/13   Right breast    TMJ (temporomandibular joint syndrome)    "left; just dx'd" (09/29/2013   Past Surgical History:  Procedure Laterality Date   AXILLARY LYMPH NODE BIOPSY Right 03/10/2013   Procedure: AXILLARY LYMPH NODE BIOPSY;  Surgeon: Adin Hector, MD;  Location: [place];  Service: General;  Laterality: Right;  sentinel node with blue dye   BREAST BIOPSY     BREAST LUMPECTOMY Right 09/29/2013   needle localization w/axillary LND/notes 09/29/2013   BREAST LUMPECTOMY WITH NEEDLE LOCALIZATION AND AXILLARY LYMPH NODE DISSECTION Right 09/29/2013   Procedure: RIGHT BREAST NEEDLE LOCALIZED LUMPECTOMY AND AXILLARY LYMPH NODE DISSECTION;  Surgeon: Adin Hector, MD;  Location: [place];  Service: General;  Laterality: Right;   COLONOSCOPY N/A 04/30/2013   Procedure: COLONOSCOPY;  Surgeon: Wonda Horner, MD;   Location: [facility];  Service: Endoscopy;  Laterality: N/A;   CRANIOTOMY Right 03/11/2022   Procedure: CRANIOTOMY TUMOR EXCISION;  Surgeon: Meade Maw, MD;  Location: [facility];  Service: Neurosurgery;  Laterality: Right;   ESOPHAGOGASTRODUODENOSCOPY N/A 04/28/2013   Procedure: ESOPHAGOGASTRODUODENOSCOPY (EGD);  Surgeon: Wonda Horner, MD;  Location: [facility];  Service: Endoscopy;  Laterality: N/A;   MASTECTOMY Right 09/29/2013   "partial"   PORT-A-CATH REMOVAL  09/29/2013   PORT-A-CATH REMOVAL Left 09/29/2013   Procedure: REMOVAL PORT-A-CATH;  Surgeon: Adin Hector, MD;  Location: [place];  Service: General;  Laterality: Left;   PORTACATH PLACEMENT N/A 03/10/2013   Procedure: INSERTION PORT-A-CATH WITH FLUOROSCOPY AND ULTRASOUND;  Surgeon: Adin Hector, MD;  Location: [place];  Service: General;  Laterality: N/A;   SURAL NERVE BX Right 05/13/2014   Procedure: SURAL NERVE BIOPSY;  Surgeon: Hosie Spangle, MD;  Location: [facility];  Service: Neurosurgery;  Laterality: Right;  Right sural nerve biopsy   TONSILLECTOMY     TOTAL ABDOMINAL HYSTERECTOMY     With bilateral salpingo-oophorectomy   Patient Active Problem List   Diagnosis Date Noted   Brain tumor ([place]) 03/17/2022   Palliative care encounter    Brain mass 03/09/2022   Seizure ([place]) 03/09/2022   Neuropathy 06/11/2014   Mixed axonal-demyelinating polyneuropathy  04/08/2014   Gait abnormality 01/26/2014   Unspecified hereditary and idiopathic peripheral neuropathy 01/26/2014   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[place]) 01/20/2014   Neuropathic pain 01/20/2014   Anxiety 01/20/2014   Unspecified deficiency anemia 01/20/2014   Hx of neutropenia 08/04/2013   Jaw pain 08/04/2013   Leukocytosis 04/27/2013   Pericardial effusion 04/27/2013   Cancer of central portion of right female breast ([place]) 02/04/2013   Need for influenza vaccination 11/29/2011   Hypertriglyceridemia 03/07/2011   Post-menopausal 03/07/2011    Anemia 12/17/2010   Heart murmur 12/17/2010   Seasonal allergies 12/17/2010   HYPOKALEMIA 02/19/2009   Essential hypertension, benign 04/09/2008    ONSET DATE: 03/21/2022 (referral)  REFERRING DIAG: D49.6 (ICD-10-CM) - Brain tumor ([place])   THERAPY DIAG:  Unsteadiness on feet  Other abnormalities of gait and mobility  Rationale for Evaluation and Treatment Rehabilitation  SUBJECTIVE:                                                                                                                                                                                              SUBJECTIVE STATEMENT: Pt reports things have been going well since leaving [facility], having some difficulty w/dressing and daughter provides 24/7 S* at home. Reports one near-miss while trying to get into bed yesterday, as pt "was not paying attention" and attempted to sit on the bed prior to reaching the bed.   Pt accompanied by:  Daughter, [name]  PERTINENT HISTORY: hypertension, hypothyroidism, tobacco use, peripheral neuropathy followed by Dr. 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 Neuro biopsy 04/2014 showed moderate severe loss of myelinated fiber.   PAIN:  Are you having pain? No Pt does have bilateral neuropathy in feet and hands, arthritis and pain originating from cancer site in breast  PRECAUTIONS: Fall  WEIGHT BEARING RESTRICTIONS No  FALLS: Has patient fallen in last 6 months?  No falls but has had one near miss while performing bed mobility   LIVING ENVIRONMENT: Lives with: lives with their family Lives in: House/apartment Stairs: Yes: External: 2 steps; can reach both Has following equipment at home: Quad cane large base, Environmental consultant - 2 wheeled, Wheelchair (manual), shower chair, bed side commode, and Grab bars  PLOF: Independent  PATIENT GOALS "Mobility I guess" "Independence is my main thing"   OBJECTIVE:   DIAGNOSTIC FINDINGS: MRI on  03/12/22  IMPRESSION: 1. Postoperative changes from interval right parietal craniotomy for tumor resection. There has been gross total resection of the tumor, with no definite residual tumor seen on this immediate postoperative examination. 2. Approximate 2 cm focus of restricted diffusion at the  deep aspect of the resection cavity, consistent with a small acute ischemic infarct. 3. Previously seen surrounding diffusion signal abnormality within the adjacent right parieto-occipital cortex has resolved. Again, this was likely related to superimposed seizure on prior exam. 4. Persistent mild diffusion signal abnormality at the right thalamus, indeterminate, but similar to prior. Attention at follow-up recommended. 5. Acute right maxillary sinusitis.  COGNITION: Overall cognitive status: Impaired   SENSATION: Bilateral peripheral neuropathy in feet and hands  COORDINATION: Heel to shin: WFL bilaterally  Finger to nose test: WNL of RUE, dysmetric on LUE w/undershooting   POSTURE: rounded shoulders and forward head  LOWER EXTREMITY MMT:    MMT Right Eval Left Eval  Hip flexion 4+ 4+  Hip extension    Hip abduction 4+ 4+  Hip adduction 4 4  Hip internal rotation    Hip external rotation    Knee flexion 5 5  Knee extension 5 4+  Ankle dorsiflexion 4 4-  Ankle plantarflexion    Ankle inversion    Ankle eversion    (Blank rows = not tested)  BED MOBILITY:  Pt reports no issues getting into/out of bed. Had her near miss yesterday due to "not paying attention"   TRANSFERS: Assistive device utilized: Environmental consultant - 2 wheeled  Sit to stand: SBA - heavy reliance on BUEs  Stand to sit: SBA   STAIRS:  Level of Assistance: SBA  Stair Negotiation Technique: Alternating Pattern  with Bilateral Rails  Number of Stairs: 4   Height of Stairs: 6"  Comments: Pt demonstrated good technique, no instability noted   GAIT: Gait pattern: step through pattern, decreased arm swing- Right,  decreased arm swing- Left, decreased stride length, decreased ankle dorsiflexion- Right, wide BOS, and poor foot clearance- Right Distance walked: Various clinic distances and 115'  Assistive device utilized: Walker - 2 wheeled and None Level of assistance: SBA and CGA Comments: Pt ambulated into/out of clinic w/RW. Noted walker was too tall for pt, so adjusted to appropriate height. Noted pt ran into doorway on L w/RW as she was entering/exiting evaluation room. Ad pt ambulate 115' without AD and pt required CGA to feel safe and demonstrated very guarded gait pattern. However, no instability noted.   FUNCTIONAL TESTs:   [facility] PT Assessment - 04/11/22 0951       Transfers   Five time sit to stand comments  16.72 sec with BUE support, unable to obtain full hip ext in stance      Ambulation/Gait   Gait velocity 32.8' over 18s = 1.82 ft/s w/RW   limited community ambulator     Balance   Balance Assessed Yes      Standardized Balance Assessment   Standardized Balance Assessment Timed Up and Go Test      Timed Up and Go Test   Normal TUG (seconds) 25.84               TODAY'S TREATMENT:  Next session    PATIENT EDUCATION: Education details: POC, eval findings  Person educated: Patient and Child(ren) Education method: Customer service manager Education comprehension: verbalized understanding and needs further education   HOME EXERCISE PROGRAM: To be established next session     GOALS: Goals reviewed with patient? Yes  SHORT TERM GOALS: Target date: 05/09/2022  Pt will perform initial HEP w/S* from daughter for improved strength, balance, transfers and gait.  Baseline: not established on eval  Goal status: INITIAL  2.  Pt will improve gait velocity to at least  2.2 ft/s with LRAD for improved gait efficiency and confidence with capabilities   Baseline: 1.82 ft/s w/RW Goal status: INITIAL  3.  Pt will improve normal TUG to less than or equal to 20 seconds  w/LRAD for improved functional mobility and decreased fall risk.  Baseline: 25.84s w/RW Goal status: INITIAL  4.  Berg to be assessed and LTG written Baseline:  Goal status: INITIAL   LONG TERM GOALS: Target date: 05/23/2022  Pt will be independent with final HEP for improved strength, balance, transfers and gait.  Baseline:  Goal status: INITIAL  2.  Berg Goal Baseline:  Goal status: INITIAL  3.  Pt will ambulate >500' on level and unlevel surfaces w/LRAD mod I for return to community mobility and improved visual scanning  Baseline:  Goal status: INITIAL  4.  Pt will improve 5 x STS to less than or equal to 15 seconds without UE support to demonstrate improved functional strength and transfer efficiency.   Baseline: 16.72s w/BUE support  Goal status: INITIAL  5.  Pt will improve gait velocity to at least 2.7 ft/s w/LRAD for improved gait efficiency and performance at community ambulator level   Baseline: 1.82 ft/s w/RW Goal status: INITIAL  6. Pt and daughter will verbalize fall prevention strategies to be used in the home and community environment for reduced fall risk   Baseline:   Goal Status: INITIAL   ASSESSMENT:  CLINICAL IMPRESSION: Patient is a 76 year old female referred to Neuro OPPT for Brain Tumor. Pt's PMH is significant for: hypertension, hypothyroidism, tobacco use, peripheral neuropathy. The following deficits were present during the exam: L inattention, impulsiveness, impaired alternating attention, impaired safety awareness and impaired short term memory. Based on gait speed, 5x STS, TUG and L inattention, pt is an incr risk for falls. Pt would benefit from skilled PT to address these impairments and functional limitations to maximize functional mobility independence.   OBJECTIVE IMPAIRMENTS decreased balance, decreased cognition, decreased coordination, decreased knowledge of condition, decreased knowledge of use of DME, difficulty walking, decreased  safety awareness, impaired perceived functional ability, impaired sensation, and impaired vision/preception.   ACTIVITY LIMITATIONS lifting, bending, squatting, transfers, bed mobility, dressing, and caring for others  PARTICIPATION LIMITATIONS: meal prep, cleaning, laundry, medication management, personal finances, driving, shopping, community activity, and yard work  [place], Transportation, and 1 comorbidity: L inattention  are also affecting patient's functional outcome.   REHAB POTENTIAL: Good  CLINICAL DECISION MAKING: Stable/uncomplicated  EVALUATION COMPLEXITY: Low  PLAN: PT FREQUENCY: 2x/week  PT DURATION: 6 weeks  PLANNED INTERVENTIONS: Therapeutic exercises, Therapeutic activity, Neuromuscular re-education, Balance training, Gait training, Patient/Family education, Self Care, Joint mobilization, Visual/preceptual remediation/compensation, DME instructions, and Re-evaluation  PLAN FOR NEXT SESSION: Berg, establish HEP for deficits highlighted on berg, L inattention and progress to gait w/reduced AD support (she has a cane)    Clovia Reine E Janissa Bertram, PT, DPT 04/11/2022, 10:40 AM

## 2022-04-11 NOTE — Addendum Note (Signed)
Addended by: Myra Gianotti L on: 04/11/2022 02:05 PM   Modules accepted: Orders

## 2022-04-13 ENCOUNTER — Ambulatory Visit
Admission: RE | Admit: 2022-04-13 | Discharge: 2022-04-13 | Disposition: A | Discharge status: Home / Self Care | Payer: Medicare PPO | Source: Ambulatory Visit | Attending: Radiation Oncology | Admitting: Radiation Oncology

## 2022-04-13 ENCOUNTER — Other Ambulatory Visit: Payer: Self-pay

## 2022-04-13 VITALS — BP 153/83 | HR 70 | Temp 97.0°F | Resp 18 | Wt 141.8 lb

## 2022-04-13 DIAGNOSIS — Z923 Personal history of irradiation: Secondary | ICD-10-CM | POA: Insufficient documentation

## 2022-04-13 DIAGNOSIS — Z171 Estrogen receptor negative status [ER-]: Secondary | ICD-10-CM | POA: Insufficient documentation

## 2022-04-13 DIAGNOSIS — M129 Arthropathy, unspecified: Secondary | ICD-10-CM | POA: Diagnosis not present

## 2022-04-13 DIAGNOSIS — Z800 Family history of malignant neoplasm of digestive organs: Secondary | ICD-10-CM | POA: Diagnosis not present

## 2022-04-13 DIAGNOSIS — K219 Gastro-esophageal reflux disease without esophagitis: Secondary | ICD-10-CM | POA: Insufficient documentation

## 2022-04-13 DIAGNOSIS — Z803 Family history of malignant neoplasm of breast: Secondary | ICD-10-CM | POA: Diagnosis not present

## 2022-04-13 DIAGNOSIS — Z79811 Long term (current) use of aromatase inhibitors: Secondary | ICD-10-CM | POA: Insufficient documentation

## 2022-04-13 DIAGNOSIS — C50911 Malignant neoplasm of unspecified site of right female breast: Secondary | ICD-10-CM | POA: Diagnosis not present

## 2022-04-13 DIAGNOSIS — C7931 Secondary malignant neoplasm of brain: Secondary | ICD-10-CM | POA: Diagnosis present

## 2022-04-13 DIAGNOSIS — I10 Essential (primary) hypertension: Secondary | ICD-10-CM | POA: Insufficient documentation

## 2022-04-13 DIAGNOSIS — Z79890 Hormone replacement therapy (postmenopausal): Secondary | ICD-10-CM | POA: Insufficient documentation

## 2022-04-13 DIAGNOSIS — Z9221 Personal history of antineoplastic chemotherapy: Secondary | ICD-10-CM | POA: Diagnosis not present

## 2022-04-13 DIAGNOSIS — R011 Cardiac murmur, unspecified: Secondary | ICD-10-CM | POA: Insufficient documentation

## 2022-04-13 DIAGNOSIS — D649 Anemia, unspecified: Secondary | ICD-10-CM | POA: Insufficient documentation

## 2022-04-13 DIAGNOSIS — D496 Neoplasm of unspecified behavior of brain: Secondary | ICD-10-CM

## 2022-04-13 DIAGNOSIS — Z87891 Personal history of nicotine dependence: Secondary | ICD-10-CM | POA: Diagnosis not present

## 2022-04-13 DIAGNOSIS — Z79899 Other long term (current) drug therapy: Secondary | ICD-10-CM | POA: Diagnosis not present

## 2022-04-13 MED ORDER — LORAZEPAM 0.5 MG PO TABS: 0.5000 mg | ORAL_TABLET | ORAL | 0 refills | Status: DC | PRN

## 2022-04-13 NOTE — Progress Notes (Signed)
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    ICD-10-CM   1. Brain neoplasm ([place])  D49.6     2. 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, right [facility])  C50.911    C79.31       HISTORY OF PRESENT ILLNESS: Tabitha Ramirez is a 76 y.o. female seen at the request of Dr. Lindi Adie.  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  Surgical pathology confirmed scattered residual invasive mass, 1.1 cm as compared to 3.4 prior to treatment, but no lymph node involvement.  She subsequently underwent adjuvant breast radiation under the care of Dr. Elvera Lennox 11/12/2013 through 12/24/2013.  She has been on active surveillance with Dr. Lindi Adie since that time without any evidence of disease recurrence as of her last follow-up visit in November 2022.  More recently, she presented to the emergency department at [facility] on 03/09/2022 with acute altered mental status with left-sided weakness and dysarthria.  A CT head performed without stroke and this showed a 2.3 x 1.6 cm mass in the right parietal lobe with surrounding vasogenic edema.  A CT C/A/P that same day was negative for any obvious active primary or metastatic disease.  An MRI brain was performed for further evaluation of the right parietal lesion and confirmed an enhancing 3 x 2.3 x 1.8 cm mass in the right parietal lobe with localized vasogenic edema, concerning for solitary metastasis.  She was started on dexamethasone and subsequently underwent a  right parietal craniotomy for resection under the care of Dr. Cari Caraway on 03/11/2022.  Her postoperative MRI on 03/12/2022 confirmed gross total resection with no evidence of residual tumor seen.  She made good progress in the inpatient rehab and was weaned off steroids and discharged home on 03/27/2022.  She had a follow-up visit with Dr. Lindi Adie on 04/04/2022 and has been kindly referred today for consideration of adjuvant postoperative radiotherapy.  Since there is no evidence of any active, systemic disease, she will continue on observation with plans for a follow-up visit with Dr. Lindi Adie in 6 months.  PREVIOUS RADIATION THERAPY: Yes  2/11-12/24/13:   Right breast / 38 Gray @ 1.8 Pearline Cables per fraction x 25 fractions Right supraclavicular fossa / 45 Gy '@1'$ .8 Gy per fraction x 25 fractions Right breast boost / 16 Gray at [employer] per fraction x 8 fractions  PAST MEDICAL HISTORY:  Past Medical History:  Diagnosis Date   Allergy    Anemia    Arthritis    "joints" (09/29/2013)   Breast cancer ([place])    GERD (gastroesophageal reflux disease)    Heart murmur    History of blood transfusion    "related to chemo/breast cancer" (09/29/2013)   Hypertension    Migraines    Personal history of chemotherapy    Personal history of radiation therapy    Radiation 11/05/13-12/24/13   Right breast    TMJ (temporomandibular joint syndrome)    "left; just dx'd" (09/29/2013      PAST SURGICAL HISTORY: Past Surgical History:  Procedure Laterality Date  AXILLARY LYMPH NODE BIOPSY Right 03/10/2013   Procedure: AXILLARY LYMPH NODE BIOPSY;  Surgeon: Adin Hector, MD;  Location: [place];  Service: General;  Laterality: Right;  sentinel node with blue dye   BREAST BIOPSY     BREAST LUMPECTOMY Right 09/29/2013   needle localization w/axillary LND/notes 09/29/2013   BREAST LUMPECTOMY WITH NEEDLE LOCALIZATION AND AXILLARY LYMPH NODE DISSECTION Right 09/29/2013   Procedure: RIGHT BREAST NEEDLE LOCALIZED LUMPECTOMY AND AXILLARY  LYMPH NODE DISSECTION;  Surgeon: Adin Hector, MD;  Location: [place];  Service: General;  Laterality: Right;   COLONOSCOPY N/A 04/30/2013   Procedure: COLONOSCOPY;  Surgeon: Wonda Horner, MD;  Location: [facility];  Service: Endoscopy;  Laterality: N/A;   CRANIOTOMY Right 03/11/2022   Procedure: CRANIOTOMY TUMOR EXCISION;  Surgeon: Meade Maw, MD;  Location: [facility];  Service: Neurosurgery;  Laterality: Right;   ESOPHAGOGASTRODUODENOSCOPY N/A 04/28/2013   Procedure: ESOPHAGOGASTRODUODENOSCOPY (EGD);  Surgeon: Wonda Horner, MD;  Location: [facility];  Service: Endoscopy;  Laterality: N/A;   MASTECTOMY Right 09/29/2013   "partial"   PORT-A-CATH REMOVAL  09/29/2013   PORT-A-CATH REMOVAL Left 09/29/2013   Procedure: REMOVAL PORT-A-CATH;  Surgeon: Adin Hector, MD;  Location: [place];  Service: General;  Laterality: Left;   PORTACATH PLACEMENT N/A 03/10/2013   Procedure: INSERTION PORT-A-CATH WITH FLUOROSCOPY AND ULTRASOUND;  Surgeon: Adin Hector, MD;  Location: [place];  Service: General;  Laterality: N/A;   SURAL NERVE BX Right 05/13/2014   Procedure: SURAL NERVE BIOPSY;  Surgeon: Hosie Spangle, MD;  Location: [facility];  Service: Neurosurgery;  Laterality: Right;  Right sural nerve biopsy   TONSILLECTOMY     TOTAL ABDOMINAL HYSTERECTOMY     With bilateral salpingo-oophorectomy    FAMILY HISTORY:  Family History  Problem Relation Age of Onset   Cancer Mother 18       Unknown type of cancer   Congestive Heart Failure Mother    Breast cancer Mother    Colon cancer Brother    Diabetes Brother 56   Heart disease Brother        AMI 05/22/2012   Congestive Heart Failure Brother    Cancer Maternal Grandmother 70       Unknown type of cancer   Breast cancer Maternal Grandmother     SOCIAL HISTORY:  Social History   Socioeconomic History   Marital status: Divorced    Spouse name: n/a   Number of children: 1   Years of education: 38   Highest education level: Not on  file  Occupational History   Occupation: retired    Comment: school Associate Professor   Occupation: subsitute   Tobacco Use   Smoking status: Former    Years: 1.00    Types: Cigarettes   Smokeless tobacco: Never   Tobacco comments:    smoked in college 1 yr   Substance and Sexual Activity   Alcohol use: No    Comment: Wine occasionally   Drug use: No   Sexual activity: Not Currently    Partners: Male    Birth control/protection: Surgical  Other Topics Concern   Not on file  Social History Narrative   Retired, worked for [employer] system in [employer] in 7/08. Currently work as Oceanographer   Social Determinants of Radio broadcast assistant Strain: Not on file  Food Insecurity: Not on file  Transportation Needs: Not on file  Physical Activity: Not on file  Stress: Not  on file  Social Connections: Not on file  Intimate Partner Violence: Not on file    ALLERGIES: Nsaids and Penicillins  MEDICATIONS:  Current Outpatient Medications  Medication Sig Dispense Refill   acetaminophen (TYLENOL) 325 MG tablet Take 1-2 tablets (325-650 mg total) by mouth every 4 (four) hours as needed for mild pain.     anastrozole (ARIMIDEX) 1 MG tablet Take 1 tablet (1 mg total) by mouth daily. 90 tablet 3   Cholecalciferol (D3-1000) 25 MCG (1000 UT) capsule Take 1 capsule (1,000 Units total) by mouth daily. 30 capsule 0   ferrous sulfate 325 (65 FE) MG tablet Take 1 tablet (325 mg total) by mouth daily with breakfast. 30 tablet 3   levETIRAcetam (KEPPRA) 500 MG tablet Take 1 tablet (500 mg total) by mouth 2 (two) times daily. 60 tablet 0   levothyroxine (SYNTHROID) 50 MCG tablet Take 1 tablet (50 mcg total) by mouth daily. 30 tablet 0   oxyCODONE-acetaminophen (PERCOCET/ROXICET) 5-325 MG tablet Take 1 tablet by mouth every 8 (eight) hours as needed for severe pain. 30 tablet 0   potassium chloride (KLOR-CON) 10 MEQ tablet Take 10 mEq by mouth daily.     rosuvastatin (CRESTOR) 10  MG tablet Take 1 tablet (10 mg total) by mouth daily. 30 tablet 0   vitamin B-12 (CYANOCOBALAMIN) 1000 MCG tablet Take 1 tablet (1,000 mcg total) by mouth daily. 30 tablet 0   No current facility-administered medications for this encounter.    REVIEW OF SYSTEMS:  On review of systems, the patient reports that she is doing well overall. She denies any chest pain, shortness of breath, cough, fevers, chills, night sweats, unintended weight changes.  She denies any bowel or bladder disturbances, and denies abdominal pain, nausea or vomiting.  She reports that her speech is much improved and her vision in her right eye is improving. She continues taking the Keppra, for seizure prophylaxis, as prescribed but is no longer taking steroids. She specifically denies any headaches, dizziness/imbalance, tremor, or seizure activity.  She denies any new musculoskeletal or joint aches or pains. A complete review of systems is obtained and is otherwise negative.    PHYSICAL EXAM:  Wt Readings from Last 3 Encounters:  04/13/22 141 lb 12.8 oz (64.3 kg)  04/04/22 141 lb 3.2 oz (64 kg)  03/17/22 139 lb 12.4 oz (63.4 kg)   Temp Readings from Last 3 Encounters:  04/13/22 (!) 97 F (36.1 C)  04/04/22 97.9 F (36.6 C) (Temporal)  03/27/22 97.8 F (36.6 C) (Oral)   BP Readings from Last 3 Encounters:  04/13/22 (!) 153/83  04/04/22 134/85  03/27/22 122/78   Pulse Readings from Last 3 Encounters:  04/13/22 70  04/04/22 83  03/27/22 71   Pain Assessment Pain Score: 0-No pain/10 In general this is a well appearing African-American female in no acute distress.  She's alert and oriented x4 and appropriate throughout the examination. Cardiopulmonary assessment is negative for acute distress and she exhibits normal effort.   KPS = 90  100 - Normal; no complaints; no evidence of disease. 90   - Able to carry on normal activity; minor signs or symptoms of disease. 80   - Normal activity with effort; some signs  or symptoms of disease. 17   - Cares for self; unable to carry on normal activity or to do active work. 60   - Requires occasional assistance, but is able to care for most of his personal needs. 50   - Requires  considerable assistance and frequent medical care. 46   - Disabled; requires special care and assistance. 62   - Severely disabled; hospital admission is indicated although death not imminent. 19   - Very sick; hospital admission necessary; active supportive treatment necessary. 10   - Moribund; fatal processes progressing rapidly. 0     - Dead  Karnofsky DA, Abelmann [place], Craver LS and Burchenal [facility] [phone]) The use of the nitrogen mustards in the palliative treatment of carcinoma: with particular reference to bronchogenic carcinoma Cancer 1 634-56  LABORATORY DATA:  Lab Results  Component Value Date   WBC 7.4 03/27/2022   HGB 9.1 (L) 03/27/2022   HCT 27.7 (L) 03/27/2022   MCV 102.6 (H) 03/27/2022   PLT 294 03/27/2022   Lab Results  Component Value Date   NA 139 03/27/2022   K 4.0 03/27/2022   CL 105 03/27/2022   CO2 25 03/27/2022   Lab Results  Component Value Date   ALT 42 03/20/2022   AST 28 03/20/2022   ALKPHOS 59 03/20/2022   BILITOT 0.5 03/20/2022     RADIOGRAPHY: No results found.    IMPRESSION/PLAN: 89. 76 y.o. 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 Today, we talked to the patient and her daughter about the findings and workup thus far. We discussed the natural history of oligometastatic breast cancer with a solitary brain metastasis and general treatment, highlighting the role of radiotherapy in the management.  She tolerated the recent surgical resection of the brain metastasis very well and the consensus recommendation is that the patient would potentially benefit from radiotherapy. The options include whole brain irradiation versus stereotactic radiosurgery. There are pros and cons associated with each of these  potential treatment options. Whole brain radiotherapy would treat the surgical fossa where she had a known metastatic deposit and help provide some reduction of risk for future brain metastases. However, whole brain radiotherapy carries potential risks including hair loss, subacute somnolence, and neurocognitive changes including a possible reduction in short-term memory. Whole brain radiotherapy also may carry a lower likelihood of tumor control at the treatment site because of the low-dose used. Stereotactic radiosurgery carries a higher likelihood for local tumor control at the targeted site with lower associated risk for neurocognitive changes such as memory loss. However, the use of stereotactic radiosurgery in this setting may leave the patient at increased risk for new brain metastases elsewhere in the brain as high as 50-60%. Accordingly, patients who receive stereotactic radiosurgery in this setting should undergo ongoing surveillance imaging with brain MRI more frequently in order to identify and treat any new small brain metastases before they become symptomatic. Post-operative stereotactic radiosurgery does carry some different risks, including a risk of radionecrosis and leptomeningeal recurrence.  PLAN: Today, we reviewed the findings and workup thus far with the patient. We discussed the dilemma regarding whole brain radiotherapy versus post-operative stereotactic radiosurgery. We discussed the pros and cons of each. We also discussed the logistics and delivery of each. We reviewed the results associated with each of the treatments described above and the anticipated acute and late sequelae associated with radiation in this setting. The patient and her daughter were encouraged to ask questions that were answered to their satisfaction.The patient seems to understand the treatment options and would like to proceed with the recommended 1-3 fraction course of post-operative stereotactic radiosurgery.    Tentatively, the patient will be set up for a planning MRI brain scan on 04/24/2022 and [facility]  CT Simulation on 04/25/2022, in anticipation of beginning her treatment on 04/27/2022, coordinated with Dr. Saintclair Halsted, neurosurgery.  We personally spent 70 minutes in this encounter including chart review, reviewing radiological studies, meeting face-to-face with the patient, entering orders and completing documentation.    Nicholos Johns, PA-C    Tyler Pita, MD  [facility] Direct Dial: [phone]  Fax: [phone] [email or web address]  Skype  LinkedIn

## 2022-04-13 NOTE — Progress Notes (Signed)
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 Suzy Bouchard CT Head Code Stroke without Contrast CLINICAL DATA:  Code stroke. Mental status change, unknown cause.  Left-sided weakness. Headache  FINDINGS: Brain:  Mild generalized parenchymal atrophy.  2.3 x 1.6 cm hyperdense mass within the right parietal lobe with mild surrounding vasogenic edema (for instance as seen on series 5, image 43).   Background moderate patchy and ill-defined hypoattenuation within the cerebral white matter, nonspecific but compatible with chronic small vessel ischemic disease.   There is no acute intracranial hemorrhage.   No demarcated cortical infarct.   No extra-axial fluid collection.   No midline shift.   Vascular: No hyperdense vessel. Atherosclerotic calcifications.   Skull: No fracture or aggressive osseous lesion.   Sinuses/Orbits: No mass or acute finding within the imaged orbits.  Fluid within the inferior left frontal sinus. Minimal mucosal thickening within the bilateral ethmoid and sphenoid sinuses at the imaged levels.   Other: Bilateral TMJ osteoarthrosis.   ASPECTS ([place] Stroke Program Early CT Score)   - Ganglionic level infarction (caudate, lentiform nuclei, internal capsule, insula, M1-M3 cortex): 7   - Supraganglionic infarction (M4-M6 cortex): 3   Total score (0-10 with 10 being normal): 10   These results were called by telephone at the time of interpretation on 03/09/2022 at 5:10 pm to provider [facility] , who verbally acknowledged these results.   IMPRESSION: 2.3 x 1.6 cm hyperdense mass within the right parietal lobe with mild surrounding vasogenic edema. This is most suspicious for an intracranial metastasis. A brain MRI without and with contrast is recommended for further evaluation.   No evidence of acute infarct or acute intracranial  hemorrhage.   Moderate chronic small vessel ischemic changes within the cerebral white matter.   Mild generalized parenchymal atrophy.   Paranasal sinus disease, as described.   03/09/2022 Dr. Priscella Mann CT Chest Abdomen Pelvis with Contrast CLINICAL DATA:  Brain metastases, unknown primary   FINDINGS: CT CHEST FINDINGS   Cardiovascular: Moderate multi-vessel coronary artery calcification.  Global cardiac size within normal limits. No pericardial effusion.  Central pulmonary arteries are of normal caliber. Mild atherosclerotic calcification within the thoracic aorta. No aortic aneurysm   Mediastinum/Nodes: No enlarged mediastinal, hilar, or axillary lymph nodes. Thyroid gland, trachea, and esophagus demonstrate no significant findings.   Lungs/Pleura: Ground-glass opacity with associated traction bronchiolectasis within the right apex likely represents focal fibrotic change. Subpleural fibrotic change within the a anterior right upper lobe and right middle lobe likely represents post radiation change. The lungs are otherwise clear. No pneumothorax or pleural effusion. Central airways are widely patent.   Musculoskeletal: No acute bone abnormality. No lytic or blastic bone lesion. Partial right mastectomy and axillary lymph node dissection has been performed. A indeterminate 9 mm nodule is seen within the a inferomedial right breast, not well characterized on this examination.   CT ABDOMEN PELVIS FINDINGS   Hepatobiliary: No focal liver abnormality is seen. No gallstones, gallbladder wall thickening, or biliary dilatation.   Pancreas: Unremarkable   Spleen: Unremarkable   Adrenals/Urinary Tract: Adrenal glands are unremarkable. Kidneys are normal, without renal calculi, focal lesion, or hydronephrosis.  Bladder is unremarkable.   Stomach/Bowel: Stomach is within normal limits. Appendix appears normal. No evidence of bowel wall thickening, distention, or inflammatory changes.    Vascular/Lymphatic: Aortic atherosclerosis. No enlarged abdominal or pelvic lymph nodes.   Reproductive: Status post  hysterectomy. No adnexal masses.   Other: No abdominal wall hernia.  Rectum unremarkable.   Musculoskeletal: No acute bone abnormality within the abdomen and pelvis. No lytic or blastic bone lesion.   IMPRESSION: 1. No evidence of primary or metastatic disease within the chest, abdomen, and pelvis. 2. Moderate multi-vessel coronary artery calcification. 3. 9 mm indeterminate nodule within the inferomedial right breast, not well characterized on this examination. Correlation with dedicated breast imaging is recommended  03/09/2022 Dr. Priscella Mann MR Brain with/without Contrast CLINICAL DATA:  Initial evaluation for metastatic disease  FINDINGS: Brain: Age-related cerebral atrophy. Patchy and confluent T2/FLAIR hyperintensity involving the periventricular and deep white matter both cerebral hemispheres as well as the pons, most consistent with chronic small vessel ischemic disease, moderately advanced in nature.   Irregular enhancing mass measuring 3.0 x 2.3 x 1.8 cm seen along the gray-white matter differentiation of the right parietal lobe (AP by craniocaudad by transverse). Surrounding T2/FLAIR signal intensity consistent with vasogenic edema without significant regional mass effect. Few scattered foci of internal susceptibility artifact consistent with blood products and/or necrosis. Findings most concerning for a possible metastatic lesion. Primary CNS neoplasm would be the primary differential consideration. No other definite lesions or abnormal enhancement elsewhere within the brain. No other mass effect or midline shift.   Additionally, there is mildly increased diffusion signal abnormality involving the cortical gray matter about the right parietal region (series 5, image 31). Overall appearance is suggestive of possible superimposed changes of seizure. Similarly, mild  diffusion signal abnormality seen at the right thalamus (series 5, image 23). This could also be related to seizure. A possible small evolving subacute ischemic infarct could also be considered. No significant associated enhancement about these locations.   No other evidence for acute or subacute ischemia. Gray-white matter differentiation otherwise maintained. No other areas of chronic cortical infarction. No other acute or chronic intracranial blood products. Ventricles normal size without hydrocephalus. No extra-axial fluid collection. Pituitary gland suprasellar region normal.   Vascular: Major intracranial vascular flow voids are maintained.  Asymmetric FLAIR signal intensity involving the left transverse and sigmoid sinus without definite T1 correlate, likely slow/sluggish flow.   Skull and upper cervical spine: Craniocervical junction with normal limits. Bone marrow signal intensity mildly heterogeneous without focal marrow replacing lesion. No scalp soft tissue abnormality.   Sinuses/Orbits: Globes and orbital soft tissues within normal limits. Scattered mucosal thickening noted about the ethmoidal air cells and maxillary sinuses. Superimposed air-fluid level within the right maxillary sinus, consistent with acute sinusitis.   Other: No mastoid effusion.   IMPRESSION: 1. 3.0 x 2.3 x 1.8 cm irregular enhancing mass involving the right parietal lobe with localized vasogenic edema. 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 A primary CNS neoplasm would be the primary differential consideration. 2. Mild diffusion signal abnormality involving the cortical gray matter about the right parietal region and right thalamus, suspected to reflect superimposed changes of seizure. Correlation with history and EEG suggested. A possible small evolving subacute ischemic infarct at the right thalamus could be considered in the correct clinical setting, although is felt to be less  likely. 3. Underlying age-related cerebral atrophy with moderately advanced chronic microvascular ischemic disease.   03/10/2022  Dr. Priscella Mann CT Head without Contrast CLINICAL DATA:  Mental status change, unknown cause Brain metastases suspected  FINDINGS: Brain: No evidence of large-territorial acute infarction. No parenchymal hemorrhage. Redemonstration of a right parietal hyperdense mass measuring up to 2.3 cm better evaluated on MRI head 03/09/2022. No extra-axial  collection.  Slight parietal sulci mass effect. Otherwise midline shift. No hydrocephalus. Basilar cisterns are patent.   Vascular: No hyperdense vessel.   Skull: No acute fracture or focal lesion.   Sinuses/Orbits: Right maxillary sinus mucosal thickening. Otherwise paranasal sinuses and mastoid air cells are clear. The orbits are unremarkable.   Other: None.   IMPRESSION: Redemonstration of a right parietal hyperdense mass measuring up to 2.3 cm better evaluated on MRI head 03/09/2022.   03/12/2022 Dr. Izora Ribas MR Brain with/without Contrast CLINICAL DATA:  Follow-up examination status post craniotomy for tumor resection  FINDINGS: Brain: Postoperative changes from interval right parietal craniotomy for tumor resection are seen. Postoperative blood products with a probable small focus of pneumocephalus present within the resection cavity. Previously seen enhancing mass has been resected. Few irregular foci of enhancement at the inferior aspect of the resection cavity favored to be postoperative and/or vascular in nature (series 18, image 100). There has been gross total resection of the tumor, with no definite residual tumor seen on this immediate postoperative examination. Approximate 2 cm focus of restricted diffusion at the deep aspect of the resection cavity consistent with a small Peri resection infarct (series 6, image 29). Normal expected overlying postoperative dural thickening and enhancement at the right  parietal convexity. Trace 2 mm subdural collection overlies the posterior right parietooccipital convexity without mass effect (series 15, image 32).   Previously seen surrounding diffusion signal abnormality within the adjacent right parieto-occipital cortex has largely resolved. Again, this was likely related to superimposed changes of seizure on previous exam. There does remain residual mildly increased diffusion signal at the right thalamus (series 5, image 23). No associated enhancement. This remains indeterminate.   Remainder the brain is otherwise unchanged in appearance with no other mass lesion or abnormal enhancement. No hydrocephalus or midline shift. Underlying atrophy with chronic microvascular ischemic disease again noted. No other mass lesion or abnormal enhancement elsewhere within the brain.   Vascular: Major intracranial vascular flow voids are preserved.   Skull and upper cervical spine: Craniocervical junction within normal limits. Bone marrow signal intensity normal. Post craniotomy changes noted at the right parietal calvarium without adverse features.   Sinuses/Orbits: Globes and orbital soft tissues within normal limits. Acute right maxillary sinusitis noted. Mastoid air cells are clear.   Other: None.   IMPRESSION: 1. Postoperative changes from interval right parietal craniotomy for tumor resection. There has been gross total resection of the tumor, with no definite residual tumor seen on this immediate postoperative examination. 2. Approximate 2 cm focus of restricted diffusion at the deep aspect of the resection cavity, consistent with a small acute ischemic infarct. 3. Previously seen surrounding diffusion signal abnormality within the adjacent right parieto-occipital cortex has resolved. Again, this was likely related to superimposed seizure on prior exam. 4. Persistent mild diffusion signal abnormality at the right thalamus, indeterminate, but similar to prior.  Attention at follow-up recommended. 5. Acute right maxillary sinusitis.   Past or anticipated interventions, if any, per neurosurgery:  Craniotomy Tumor Excision (right) 03/11/2022 Dr. Izora Ribas.  Past or anticipated interventions, if any, per medical oncology: NA  Dose of Decadron, if applicable: No  Recent neurologic symptoms, if any:  Seizures: No, past in June. Headaches: No Nausea: No Dizziness/ataxia: No Difficulty with hand coordination: Yes Focal numbness/weakness: Ye, neuropathy hands/feet Visual deficits/changes: Left side visual changes Confusion/Memory deficits: No  Painful bone metastases at present, if any: Left forearm possible arthritis per patient.  SAFETY ISSUES: Prior radiation? Yes, 2014 Breast Ca  Pacemaker/ICD? No Possible current pregnancy? Post menopausal Is the patient on methotrexate? No  Additional Complaints / other details:

## 2022-04-18 ENCOUNTER — Ambulatory Visit: Payer: Medicare PPO | Admitting: Speech Pathology

## 2022-04-18 ENCOUNTER — Ambulatory Visit: Payer: Medicare PPO | Admitting: Occupational Therapy

## 2022-04-18 DIAGNOSIS — R41841 Cognitive communication deficit: Secondary | ICD-10-CM

## 2022-04-18 DIAGNOSIS — R2681 Unsteadiness on feet: Secondary | ICD-10-CM | POA: Diagnosis not present

## 2022-04-18 NOTE — Therapy (Signed)
OUTPATIENT SPEECH LANGUAGE PATHOLOGY TREATMENT   Patient Name: Tabitha Ramirez MRN: [id number] DOB:1946-04-04, 76 y.o., female Today's Date: 04/18/2022  PCP: [place] PROVIDER: Cathlyn Parsons, PA-C    End of Session - 04/18/22 0935     Visit Number 2    Number of Visits 17    Date for SLP Re-Evaluation 06/06/22    Authorization Type [employer]    Progress Note Due on Visit 10    SLP Start Time [phone]    SLP Stop Time  1015    SLP Time Calculation (min) 39 min    Activity Tolerance Patient tolerated treatment well              Past Medical History:  Diagnosis Date   Allergy    Anemia    Arthritis    "joints" (09/29/2013)   Breast cancer ([place])    GERD (gastroesophageal reflux disease)    Heart murmur    History of blood transfusion    "related to chemo/breast cancer" (09/29/2013)   Hypertension    Migraines    Personal history of chemotherapy    Personal history of radiation therapy    Radiation 11/05/13-12/24/13   Right breast    TMJ (temporomandibular joint syndrome)    "left; just dx'd" (09/29/2013   Past Surgical History:  Procedure Laterality Date   AXILLARY LYMPH NODE BIOPSY Right 03/10/2013   Procedure: AXILLARY LYMPH NODE BIOPSY;  Surgeon: Adin Hector, MD;  Location: [place];  Service: General;  Laterality: Right;  sentinel node with blue dye   BREAST BIOPSY     BREAST LUMPECTOMY Right 09/29/2013   needle localization w/axillary LND/notes 09/29/2013   BREAST LUMPECTOMY WITH NEEDLE LOCALIZATION AND AXILLARY LYMPH NODE DISSECTION Right 09/29/2013   Procedure: RIGHT BREAST NEEDLE LOCALIZED LUMPECTOMY AND AXILLARY LYMPH NODE DISSECTION;  Surgeon: Adin Hector, MD;  Location: [place];  Service: General;  Laterality: Right;   COLONOSCOPY N/A 04/30/2013   Procedure: COLONOSCOPY;  Surgeon: Wonda Horner, MD;  Location: [facility];  Service: Endoscopy;  Laterality: N/A;   CRANIOTOMY Right 03/11/2022   Procedure: CRANIOTOMY TUMOR EXCISION;   Surgeon: Meade Maw, MD;  Location: [facility];  Service: Neurosurgery;  Laterality: Right;   ESOPHAGOGASTRODUODENOSCOPY N/A 04/28/2013   Procedure: ESOPHAGOGASTRODUODENOSCOPY (EGD);  Surgeon: Wonda Horner, MD;  Location: [facility];  Service: Endoscopy;  Laterality: N/A;   MASTECTOMY Right 09/29/2013   "partial"   PORT-A-CATH REMOVAL  09/29/2013   PORT-A-CATH REMOVAL Left 09/29/2013   Procedure: REMOVAL PORT-A-CATH;  Surgeon: Adin Hector, MD;  Location: [place];  Service: General;  Laterality: Left;   PORTACATH PLACEMENT N/A 03/10/2013   Procedure: INSERTION PORT-A-CATH WITH FLUOROSCOPY AND ULTRASOUND;  Surgeon: Adin Hector, MD;  Location: [place];  Service: General;  Laterality: N/A;   SURAL NERVE BX Right 05/13/2014   Procedure: SURAL NERVE BIOPSY;  Surgeon: Hosie Spangle, MD;  Location: [facility];  Service: Neurosurgery;  Laterality: Right;  Right sural nerve biopsy   TONSILLECTOMY     TOTAL ABDOMINAL HYSTERECTOMY     With bilateral salpingo-oophorectomy   Patient Active Problem List   Diagnosis Date Noted   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, right ([place]) 04/13/2022   Brain tumor ([place]) 03/17/2022   Palliative care encounter    Brain mass 03/09/2022   Seizure ([place]) 03/09/2022   Neuropathy 06/11/2014   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   Gait abnormality 01/26/2014   Unspecified hereditary and idiopathic peripheral  neuropathy 01/26/2014   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[place]) 01/20/2014   Neuropathic pain 01/20/2014   Anxiety 01/20/2014   Unspecified deficiency anemia 01/20/2014   Hx of neutropenia 08/04/2013   Jaw pain 08/04/2013   Leukocytosis 04/27/2013   Pericardial effusion 04/27/2013   Cancer of central portion of right female breast ([place]) 02/04/2013   Need for influenza vaccination 11/29/2011   Hypertriglyceridemia 03/07/2011   Post-menopausal 03/07/2011   Anemia 12/17/2010   Heart murmur 12/17/2010   Seasonal  allergies 12/17/2010   HYPOKALEMIA 02/19/2009   Essential hypertension, benign 04/09/2008    ONSET DATE: 03-09-2022   REFERRING DIAG: D49.6 (ICD-10-CM) - Brain tumor ([place])   THERAPY DIAG:  Cognitive communication deficit  Rationale for Evaluation and Treatment Rehabilitation  SUBJECTIVE:   SUBJECTIVE STATEMENT: Pt reports to working on rehab goals over weekend"  Pt accompanied by: family member, daughter [name]  PAIN:  Are you having pain? No  OBJECTIVE:   STANDARDIZED ASSESSMENTS: CLQT: Attention: Moderate, Memory: Mild, Executive Function: Severe, Language: WNL, Visuospatial Skills: Moderate, and Clock Drawing: Moderate Impaired awareness- anticipatory and emergent. Some evidence of intellectual awareness but several errors required highlighting by SLP. Difficulty processing complex or multi-factorial stimuli. Evidences difficulty with planning, organization, problem solving.   TODAY'S TREATMENT:  04-18-22: Reports to taking medications out of pill box, after daughter was sorted, set out, and reminded regarding admin time, and taking them. She is ensuring she takes all pills out of the correct slot. Target medication management this session. SLP led pt through simulated reading of medication labels. Able to answer questions using medication labels with 100% accuracy. SLP provided pt with medication list chart. Pt able to ID where information would be penciled in with 75% accuracy. Difficulties with deciding how pills would be sorted into AM/PM medication box per label instructions. SLP provides further instruction and sends medication list home with pt for completion with own medications as HEP. Pt able to complete medication scheduling worksheet with 100% accuracy, demonstrating attention to detail and working memory adequate for task, with occasional min-A.    PATIENT EDUCATION: Education details: see above Person educated: Patient and Caregiver daughter Education method:  Customer service manager Education comprehension: verbalized understanding, returned demonstration, and needs further education  GOALS: Goals reviewed with patient? Yes  SHORT TERM GOALS: Target date: 05/09/2022  Pt will teach back attention and memory compensations and strategies to aid in increased independence at home given rare min-A.  Baseline: Goal status: IN PROGRESS  2.  Pt will design bill pay system to aid in successful and accurate completion of all bills with occasional min-A over 2 sessions.  Baseline:  Goal status: IN PROGRESS  3.  Pt will accurately sort medications, using targeted strategies, into weekly pill box with no more than 3 verbal cues.  Baseline:  Goal status: IN PROGRESS  4.  Pt will teach back meta-cognitive strategies to aid in increased independence at home during execution of leisure tasks with rare min-A.  Baseline:  Goal status: IN PROGRESS  5.  Pt will complete CLQT during first therapy session.   Baseline: 04-18-22  Goal Status: Met  LONG TERM GOALS: Target date: 06/06/2022  Pt will report carryover of attention and memory compensations, with benefit, to aid in recall of prospective memory tasks over 1 week period.  Baseline:  Goal status: IN PROGRESS  2.  Pt will carryover bill pay system to accurately pay all bills with distant supervision from daughter over 1 week period.  Baseline:  Goal status: IN PROGRESS  3.  Pt and daughter will report successful increased independence from baseline of total-A for medication management, evidenced by no missed doses over 1 week period.  Baseline:  Goal status: IN PROGRESS  4.  Pt will report 2 pt improvement via Cognitive Function PROM by d/c.  Baseline:  Goal status: IN PROGRESS  ASSESSMENT:  CLINICAL IMPRESSION: Patient is a 76 y.o. F who was seen today for cognitive communication deficits s/p recitation of R parietal lobe brain tumor. Prior to event, pt with full independence, lived  alone, managed all aspects of home and schedule. Currently, daughter, son-in-law, and grandson are residing with pt to assist in rehabilitation. Overall, pt and daughter report pt is returning to baseline with improved processing and cognitive function. Some residual deviation from baseline reported in areas of attention, memory, and processing. Pt is requiring additional time to problem solve complex problems, decreased memory and retention of novel information, impaired prospective memory. Requires total-A for medication and financial management, desires return to previous status of full independence. SLP completed CLQT, pt with moderate cognitive impairment, see note for details. Initiated training for increased independence in medication management. I recommend skilled ST to target cognitive deficits in effort to increase independence, reduce caregiver burden, and facilitate return to participation in desired tasks.    OBJECTIVE IMPAIRMENTS include attention, memory, awareness, and executive functioning. These impairments are limiting patient from managing medications, managing appointments, managing finances, and household responsibilities. Factors affecting potential to achieve goals and functional outcome are medical prognosis.. Patient will benefit from skilled SLP services to address above impairments and improve overall function.  REHAB POTENTIAL: Good  PLAN: SLP FREQUENCY: 2x/week  SLP DURATION: 8 weeks  PLANNED INTERVENTIONS: Cueing hierachy, Cognitive reorganization, Internal/external aids, Functional tasks, SLP instruction and feedback, Compensatory strategies, and Patient/family education    Su Monks, CCC-SLP 04/18/2022, 9:36 AM

## 2022-04-19 ENCOUNTER — Other Ambulatory Visit: Payer: Self-pay | Admitting: *Deleted

## 2022-04-19 ENCOUNTER — Other Ambulatory Visit: Payer: Self-pay | Admitting: Hematology and Oncology

## 2022-04-19 DIAGNOSIS — Z171 Estrogen receptor negative status [ER-]: Secondary | ICD-10-CM

## 2022-04-19 MED ORDER — OXYCODONE-ACETAMINOPHEN 5-325 MG PO TABS: 1.0000 | ORAL_TABLET | Freq: Three times a day (TID) | ORAL | 0 refills | Status: DC | PRN

## 2022-04-19 NOTE — Progress Notes (Signed)
Received call from pt daughter [name] stating pt is experiencing increase in neuropathy pain as well as pain from recent brain surgery.  Pt daughter is open to palliative care options at this time. Per MD request RN placed referral for palliative care for pain management.

## 2022-04-20 ENCOUNTER — Ambulatory Visit: Payer: Medicare PPO | Admitting: Neurosurgery

## 2022-04-20 ENCOUNTER — Encounter: Payer: Self-pay | Admitting: Neurosurgery

## 2022-04-20 ENCOUNTER — Encounter: Payer: Medicare PPO | Attending: Registered Nurse | Admitting: Registered Nurse

## 2022-04-20 ENCOUNTER — Encounter: Payer: Self-pay | Admitting: Registered Nurse

## 2022-04-20 VITALS — BP 138/78 | Temp 97.9°F | Ht 64.0 in | Wt 141.6 lb

## 2022-04-20 VITALS — BP 149/83 | HR 71 | Ht 64.0 in | Wt 141.4 lb

## 2022-04-20 DIAGNOSIS — C50911 Malignant neoplasm of unspecified site of right female breast: Secondary | ICD-10-CM

## 2022-04-20 DIAGNOSIS — E7849 Other hyperlipidemia: Secondary | ICD-10-CM | POA: Insufficient documentation

## 2022-04-20 DIAGNOSIS — D496 Neoplasm of unspecified behavior of brain: Secondary | ICD-10-CM | POA: Insufficient documentation

## 2022-04-20 DIAGNOSIS — Z298 Encounter for other specified prophylactic measures: Secondary | ICD-10-CM | POA: Diagnosis not present

## 2022-04-20 MED ORDER — LEVETIRACETAM 500 MG PO TABS: 500.0000 mg | ORAL_TABLET | Freq: Two times a day (BID) | ORAL | 0 refills | Status: DC

## 2022-04-20 NOTE — Progress Notes (Signed)
   DOS: 03/11/22 (R Crani for tumor resection)  HISTORY OF PRESENT ILLNESS: 04/20/2022 [name] is status post craniotomy for tumor resection.  She has known history of breast cancer and this lesion was consistent with metastatic breast cancer.  She is doing well.  She will start her radiation treatment in the coming 2 weeks.  PHYSICAL EXAMINATION:   Vitals:   04/20/22 1432  BP: 138/78  Temp: 97.9 F (36.6 C)   General: Patient is well developed, well nourished, calm, collected, and in no apparent distress.  NEUROLOGICAL:  General: In no acute distress.  Awake, alert, oriented to person, place, and time. Pupils equal round and reactive to light.   Strength: Side Biceps Triceps Deltoid Interossei Grip Wrist Ext. Wrist Flex.  R '5 5 5 5 5 5 5  '$ L '5 5 5 5 5 5 5   '$ Incision c/d/i   ROS (Neurologic): Negative except as noted above  IMAGING: No interval imaging to review   ASSESSMENT/PLAN:  Tabitha Ramirez is doing well after craniotomy for metastatic breast tumor.  She is starting radiation treatment soon.  She will continue with physical therapy for now.  I will refill her Keppra.  I will see her back in 3 months.  At that point, we will discuss weaning her Keppra and reinstituting her ability to drive if she is felt to be capable at that point.  I spent a total of 15 minutes in face-to-face and non-face-to-face activities related to this patient's care today.   Meade Maw MD, [facility] Department of Neurosurgery

## 2022-04-20 NOTE — Progress Notes (Signed)
Subjective:    Patient ID: Tabitha Ramirez, female    DOB: 12-Oct-1945, 76 y.o.   MRN: [id number]  HPI: Tabitha Ramirez is a 76 y.o. female who is here for [facility] appointment for Follow up of her  Brain Tumor, Seizure Prophylaxis and Hyperlipidemia.  She presented to [facility] on 03/09/2022 with headache, altered mental status H&P Dr Starleen Blue Altered Mental Status    HPI   Tabitha Ramirez is a 76 y.o. female past medical history of breast cancer, hypertension, GERD who presents with altered mental status.  Patient tells me that she became acutely disoriented today around 63.  Her son notes that when she called he immediately came to her and noticed she was weak on the left side was having difficulty talking and difficulty moving her eyes.  No reports of falls.  Patient does complain of a mild headache denies nausea vomiting fevers chills.  Feeling weak on the left side.  Does have a remote history of breast cancer has been in remission for multiple years. Neurology Consulted:  CT Head WO Contrast:  IMPRESSION: 2.3 x 1.6 cm hyperdense mass within the right parietal lobe with mild surrounding vasogenic edema. This is most suspicious for an intracranial metastasis. A brain MRI without and with contrast is recommended for further evaluation.   No evidence of acute infarct or acute intracranial hemorrhage.   Moderate chronic small vessel ischemic changes within the cerebral white matter.   Mild generalized parenchymal atrophy.   Paranasal sinus disease, as described.   CT Chest: Abdomen and Pelvis IMPRESSION: 1. No evidence of primary or metastatic disease within the chest, abdomen, and pelvis. 2. Moderate multi-vessel coronary artery calcification. 3. 9 mm indeterminate nodule within the inferomedial right breast, not well characterized on this examination. Correlation with dedicated breast imaging is recommended.   MR Brain: WO Contrast IMPRESSION: 1. 3.0 x 2.3 x 1.8 cm irregular enhancing  mass involving the right parietal lobe with localized vasogenic edema. 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 A primary CNS neoplasm would be the primary differential consideration. 2. Mild diffusion signal abnormality involving the cortical gray matter about the right parietal region and right thalamus, suspected to reflect superimposed changes of seizure. Correlation with history and EEG suggested. A possible small evolving subacute ischemic infarct at the right thalamus could be considered in the correct clinical setting, although is felt to be less likely. 3. Underlying age-related cerebral atrophy with moderately advanced chronic microvascular ischemic disease.    Tabitha Ramirez underwent on 03/11/2022: Dr Izora Ribas      Procedure Laterality Anesthesia  CRANIOTOMY TUMOR EXCISION        Oncology consulted and following. Tabitha Ramirez was admitted to inpatient rehabilitation on 03/17/2022 and discharged home on 03/27/2022. She is receiving outpatient therapy at [facility]. She denies any pain. She rates her pain 0. Also reports she has a good appetite.   Pain Inventory Average Pain 10 Pain Right Now 0 My pain is intermittent, sharp, burning, and aching  LOCATION OF PAIN  Hand(L), Arm(L), Head  BOWEL Number of stools per week: 10-12 Oral laxative use No    BLADDER Pads  Frequent urination Yes     Mobility walk with assistance use a walker how many minutes can you walk? 15-20 ability to climb steps?  yes do you drive?  no  Function retired  Neuro/Psych bladder control problems numbness tingling confusion  Prior Studies Any changes since last visit?  no  Physicians involved in  your care Any changes since last visit?  no   Family History  Problem Relation Age of Onset  . Cancer Mother 65       Unknown type of cancer  . Congestive Heart Failure Mother   . Breast cancer Mother   . Colon cancer Brother   . Diabetes  Brother 42  . Heart disease Brother        AMI 05/22/2012  . Congestive Heart Failure Brother   . Cancer Maternal Grandmother 33       Unknown type of cancer  . Breast cancer Maternal Grandmother    Social History   Socioeconomic History  . Marital status: Divorced    Spouse name: n/a  . Number of children: 1  . Years of education: 46  . Highest education level: Not on file  Occupational History  . Occupation: retired    Comment: school Associate Professor  . Occupation: subsitute   Tobacco Use  . Smoking status: Former    Years: 1.00    Types: Cigarettes  . Smokeless tobacco: Never  . Tobacco comments:    smoked in college 1 yr   Vaping Use  . Vaping Use: Never used  Substance and Sexual Activity  . Alcohol use: No    Comment: Wine occasionally  . Drug use: No  . Sexual activity: Not Currently    Partners: Male    Birth control/protection: Surgical  Other Topics Concern  . Not on file  Social History Narrative   Retired, worked for [employer] system in [employer] in 7/08. Currently work as Oceanographer   Social Determinants of Radio broadcast assistant Strain: Not on file  Food Insecurity: Not on file  Transportation Needs: Not on file  Physical Activity: Not on file  Stress: Not on file  Social Connections: Not on file   Past Surgical History:  Procedure Laterality Date  . AXILLARY LYMPH NODE BIOPSY Right 03/10/2013   Procedure: AXILLARY LYMPH NODE BIOPSY;  Surgeon: Adin Hector, MD;  Location: [place];  Service: General;  Laterality: Right;  sentinel node with blue dye  . BREAST BIOPSY    . BREAST LUMPECTOMY Right 09/29/2013   needle localization w/axillary LND/notes 09/29/2013  . BREAST LUMPECTOMY WITH NEEDLE LOCALIZATION AND AXILLARY LYMPH NODE DISSECTION Right 09/29/2013   Procedure: RIGHT BREAST NEEDLE LOCALIZED LUMPECTOMY AND AXILLARY LYMPH NODE DISSECTION;  Surgeon: Adin Hector, MD;  Location: [place];  Service: General;  Laterality: Right;   . COLONOSCOPY N/A 04/30/2013   Procedure: COLONOSCOPY;  Surgeon: Wonda Horner, MD;  Location: [facility];  Service: Endoscopy;  Laterality: N/A;  . CRANIOTOMY Right 03/11/2022   Procedure: CRANIOTOMY TUMOR EXCISION;  Surgeon: Meade Maw, MD;  Location: [facility];  Service: Neurosurgery;  Laterality: Right;  . ESOPHAGOGASTRODUODENOSCOPY N/A 04/28/2013   Procedure: ESOPHAGOGASTRODUODENOSCOPY (EGD);  Surgeon: Wonda Horner, MD;  Location: [facility];  Service: Endoscopy;  Laterality: N/A;  . MASTECTOMY Right 09/29/2013   "partial"  . PORT-A-CATH REMOVAL  09/29/2013  . PORT-A-CATH REMOVAL Left 09/29/2013   Procedure: REMOVAL PORT-A-CATH;  Surgeon: Adin Hector, MD;  Location: [place];  Service: General;  Laterality: Left;  . PORTACATH PLACEMENT N/A 03/10/2013   Procedure: INSERTION PORT-A-CATH WITH FLUOROSCOPY AND ULTRASOUND;  Surgeon: Adin Hector, MD;  Location: [place];  Service: General;  Laterality: N/A;  . SURAL NERVE BX Right 05/13/2014   Procedure: SURAL NERVE BIOPSY;  Surgeon: Hosie Spangle, MD;  Location: [facility];  Service: Neurosurgery;  Laterality: Right;  Right sural nerve biopsy  . TONSILLECTOMY    . TOTAL ABDOMINAL HYSTERECTOMY     With bilateral salpingo-oophorectomy   Past Medical History:  Diagnosis Date  . Allergy   . Anemia   . Arthritis    "joints" (09/29/2013)  . Breast cancer ([place])   . GERD (gastroesophageal reflux disease)   . Heart murmur   . History of blood transfusion    "related to chemo/breast cancer" (09/29/2013)  . Hypertension   . Migraines   . Personal history of chemotherapy   . Personal history of radiation therapy   . Radiation 11/05/13-12/24/13   Right breast   . TMJ (temporomandibular joint syndrome)    "left; just dx'd" (09/29/2013   BP (!) 149/83   Pulse 71   Ht '5\' 4"'$  (1.626 m)   Wt 141 lb 6.4 oz (64.1 kg)   SpO2 95%   BMI 24.27 kg/m   Opioid Risk Score:   Fall Risk Score:  `1  Depression screen [facility] 2/9     04/20/2022    10:56 AM 02/14/2016   11:08 AM 12/01/2015   12:48 PM 03/03/2015   12:20 PM 02/20/2014   11:07 AM 11/29/2011    3:11 PM 03/07/2011    3:27 PM  Depression screen PHQ 2/9  Decreased Interest 0 0 0 0 0 0 0  Down, Depressed, Hopeless 0 0 0 0 0 0 0  PHQ - 2 Score 0 0 0 0 0 0 0      Review of Systems  Musculoskeletal:  Positive for gait problem.       Left Hand Pain Right hand Pain Head pain  All other systems reviewed and are negative.     Objective:   Physical Exam Vitals and nursing note reviewed.  Constitutional:      Appearance: Normal appearance.  Cardiovascular:     Rate and Rhythm: Normal rate and regular rhythm.     Pulses: Normal pulses.     Heart sounds: Normal heart sounds.  Pulmonary:     Effort: Pulmonary effort is normal.     Breath sounds: Normal breath sounds.  Musculoskeletal:     Cervical back: Normal range of motion and neck supple.     Comments: Normal Muscle Bulk and Muscle Testing Reveals:  Upper Extremities: Full ROM and Muscle Strength 5/5  Lower Extremities: Full ROM and Muscle Strength 5/5  Arises from Table Slowly using walker for support Narrow Based Gait     Skin:    General: Skin is warm and dry.  Neurological:     Mental Status: She is alert and oriented to person, place, and time.  Psychiatric:        Mood and Affect: Mood normal.        Behavior: Behavior normal.         Assessment & Plan:   Brain Tumor, Seizure Prophylaxis: Neurosurgery and Oncology Following. Continue current medication regimen. Continue to monitor.   Hyperlipidemia: Continue current medication regimen. PCP folloing. Continue to Monitor.   F/U with Dr Naaman Plummer in 4-6 weeks

## 2022-04-21 ENCOUNTER — Ambulatory Visit: Payer: Medicare PPO | Admitting: Speech Pathology

## 2022-04-21 ENCOUNTER — Ambulatory Visit: Payer: Medicare PPO | Admitting: Physical Therapy

## 2022-04-21 ENCOUNTER — Encounter: Payer: Self-pay | Admitting: Physical Therapy

## 2022-04-21 DIAGNOSIS — R41841 Cognitive communication deficit: Secondary | ICD-10-CM

## 2022-04-21 DIAGNOSIS — R278 Other lack of coordination: Secondary | ICD-10-CM

## 2022-04-21 DIAGNOSIS — R2681 Unsteadiness on feet: Secondary | ICD-10-CM

## 2022-04-21 DIAGNOSIS — R2689 Other abnormalities of gait and mobility: Secondary | ICD-10-CM

## 2022-04-21 DIAGNOSIS — M6281 Muscle weakness (generalized): Secondary | ICD-10-CM

## 2022-04-21 NOTE — Therapy (Signed)
OUTPATIENT PHYSICAL THERAPY NEURO TREATMENT   Patient Name: Tabitha Ramirez MRN: [id number] DOB:30-Oct-1945, 76 y.o., female Today's Date: 04/21/2022   PCP: [facility]  REFERRING PROVIDER: Cathlyn Parsons, PA-C    PT End of Session - 04/21/22 1016     Visit Number 2    Number of Visits 13   plus eval   Date for PT Re-Evaluation 05/30/22    Authorization Type [employer]    Progress Note Due on Visit 10    PT Start Time 1016    PT Stop Time 1055    PT Time Calculation (min) 39 min    Equipment Utilized During Treatment Gait belt    Activity Tolerance Patient tolerated treatment well    Behavior During Therapy WFL for tasks assessed/performed;Impulsive              Past Medical History:  Diagnosis Date   Allergy    Anemia    Arthritis    "joints" (09/29/2013)   Breast cancer ([place])    GERD (gastroesophageal reflux disease)    Heart murmur    History of blood transfusion    "related to chemo/breast cancer" (09/29/2013)   Hypertension    Migraines    Personal history of chemotherapy    Personal history of radiation therapy    Radiation 11/05/13-12/24/13   Right breast    TMJ (temporomandibular joint syndrome)    "left; just dx'd" (09/29/2013   Past Surgical History:  Procedure Laterality Date   AXILLARY LYMPH NODE BIOPSY Right 03/10/2013   Procedure: AXILLARY LYMPH NODE BIOPSY;  Surgeon: Adin Hector, MD;  Location: [place];  Service: General;  Laterality: Right;  sentinel node with blue dye   BREAST BIOPSY     BREAST LUMPECTOMY Right 09/29/2013   needle localization w/axillary LND/notes 09/29/2013   BREAST LUMPECTOMY WITH NEEDLE LOCALIZATION AND AXILLARY LYMPH NODE DISSECTION Right 09/29/2013   Procedure: RIGHT BREAST NEEDLE LOCALIZED LUMPECTOMY AND AXILLARY LYMPH NODE DISSECTION;  Surgeon: Adin Hector, MD;  Location: [place];  Service: General;  Laterality: Right;   COLONOSCOPY N/A 04/30/2013   Procedure: COLONOSCOPY;  Surgeon: Wonda Horner,  MD;  [facility]: [facility];  Service: Endoscopy;  Laterality: N/A;   CRANIOTOMY Right 03/11/2022   Procedure: CRANIOTOMY TUMOR EXCISION;  Surgeon: Meade Maw, MD;  Location: [facility];  Service: Neurosurgery;  Laterality: Right;   ESOPHAGOGASTRODUODENOSCOPY N/A 04/28/2013   Procedure: ESOPHAGOGASTRODUODENOSCOPY (EGD);  Surgeon: Wonda Horner, MD;  Location[facility];  Service: Endoscopy;  Laterality: N/A;   MASTECTOMY Right 09/29/2013   "partial"   PORT-A-CATH REMOVAL  09/29/2013   PORT-A-CATH REMOVAL Left 09/29/2013   Procedure: REMOVAL PORT-A-CATH;  Surgeon: Adin Hector, MD;  Location: [place];  Service: General;  Laterality: Left;   PORTACATH PLACEMENT N/A 03/10/2013   Procedure: INSERTION PORT-A-CATH WITH FLUOROSCOPY AND ULTRASOUND;  Surgeon: Adin Hector, MD;  Location: [place];  Service: General;  Laterality: N/A;   SURAL NERVE BX Right 05/13/2014   Procedure: SURAL NERVE BIOPSY;  Surgeon: Hosie Spangle, MD;  Location: [facility];  Service: Neurosurgery;  Laterality: Right;  Right sural nerve biopsy   TONSILLECTOMY     TOTAL ABDOMINAL HYSTERECTOMY     With bilateral salpingo-oophorectomy   Patient Active Problem List   Diagnosis Date Noted   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, right ([place]) 04/13/2022   Brain tumor ([place]) 03/17/2022   Palliative care encounter    Brain mass 03/09/2022  Seizure ([place]) 03/09/2022   Neuropathy 06/11/2014   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   Gait abnormality 01/26/2014   Unspecified hereditary and idiopathic peripheral neuropathy 01/26/2014   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[place]) 01/20/2014   Neuropathic pain 01/20/2014   Anxiety 01/20/2014   Unspecified deficiency anemia 01/20/2014   Hx of neutropenia 08/04/2013   Jaw pain 08/04/2013   Leukocytosis 04/27/2013   Pericardial effusion 04/27/2013   Cancer of central portion of right female breast ([place]) 02/04/2013   Need for influenza vaccination  11/29/2011   Hypertriglyceridemia 03/07/2011   Post-menopausal 03/07/2011   Anemia 12/17/2010   Heart murmur 12/17/2010   Seasonal allergies 12/17/2010   HYPOKALEMIA 02/19/2009   Essential hypertension, benign 04/09/2008    ONSET DATE: 03/21/2022 (referral)  REFERRING DIAG: D49.6 (ICD-10-CM) - Brain tumor ([place])   THERAPY DIAG:  Muscle weakness (generalized)  Unsteadiness on feet  Other lack of coordination  Other abnormalities of gait and mobility  Rationale for Evaluation and Treatment Rehabilitation  SUBJECTIVE:                                                                                                                                                                                              SUBJECTIVE STATEMENT: Pt reports things have been going well since last visit, no changes. Pt is not having any pain today but a couple of days ago had some pain in her LUE from neuropathy and arthritis, reports her anxiety medication and Tylenol helped relieve the pain. Pt's son-in-law Vicente Males reports that pt has been walking around the house at times by herself without using the RW.  Pt accompanied by:  Rosanne Sack  PERTINENT HISTORY: hypertension, hypothyroidism, tobacco use, peripheral neuropathy followed by Dr. 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 Neuro biopsy 04/2014 showed moderate severe loss of myelinated fiber.   PAIN:  Are you having pain? No  PRECAUTIONS: Fall  WEIGHT BEARING RESTRICTIONS No  FALLS: Has patient fallen in last 6 months?  No falls but has had one near miss while performing bed mobility   PATIENT GOALS "Mobility I guess" "Independence is my main thing"    OBJECTIVE:   TODAY'S TREATMENT:  THER ACT:  [facility] PT Assessment - 04/21/22 1018       Balance   Balance Assessed Yes      Standardized Balance Assessment   Standardized Balance Assessment Berg Balance Test      Berg Balance Test   Sit to Stand  Able to stand without using hands and stabilize independently    Standing Unsupported Able to stand 2 minutes with supervision  Sitting with Back Unsupported but Feet Supported on Floor or Stool Able to sit safely and securely 2 minutes    Stand to Sit Uses backs of legs against chair to control descent    Transfers Able to transfer safely, minor use of hands    Standing Unsupported with Eyes Closed Able to stand 10 seconds with supervision    Standing Unsupported with Feet Together Able to place feet together independently and stand for 1 minute with supervision    From Standing, Reach Forward with Outstretched Arm Can reach forward >5 cm safely (2")    From Standing Position, Pick up Object from [place] to pick up shoe, needs supervision    From Standing Position, Turn to Look Behind Over each Shoulder Looks behind from both sides and weight shifts well    Turn 360 Degrees Able to turn 360 degrees safely but slowly    Standing Unsupported, Alternately Place Feet on Step/Stool Able to complete >2 steps/needs minimal assist    Standing Unsupported, One Foot in Front Able to take small step independently and hold 30 seconds    Standing on One Leg Tries to lift leg/unable to hold 3 seconds but remains standing independently    Total Score 38    Berg comment: 38/56, significant fall risk            GAIT: Gait pattern: decreased arm swing- Right, decreased arm swing- Left, decreased step length- Right, and decreased hip/knee flexion- Left Distance walked: 230' Assistive device utilized: None Level of assistance: CGA Comments: Trial gait with no AD as pt's family reports she has been walking around the house by herself without using an AD. Pt exhibits decreased safety and balance with onset of fatigue with gait with no AD. She exhibits decreased B arm swing during gait and decreased RLE clearance with onset of fatigue. Pt unaware of balance and gait deficits that she exhibits. Discussed  continued use of RW in the home as pt is not safe to ambulate by herself with no AD at this time. Pt states her understanding of recommendations.  THER EX: Initiated HEP, see below  SciFit multi-peaks level 1 for 5 minutes using BUE/BLEs for neural priming for reciprocal movement, dynamic cardiovascular warmup and increased amplitude of stepping. Pt unable to rate her RPE following exercise, reports feeling "sore" and that her muscles worked following exercise.   PATIENT EDUCATION: Education details: initiated HEP, use of AD and safety with gait in the home, BBS results and functional implications Person educated: Patient and Son In [place] Education method: Explanation, Media planner, and Handouts Education comprehension: verbalized understanding and needs further education   HOME EXERCISE PROGRAM: Access Code: [email or web address] URL: [email or web address] Date: 04/21/2022 Prepared by: Excell Seltzer  Exercises - Side Stepping with Counter Support  - 1 x daily - 7 x weekly - 1 sets - 5 reps - Alternating Step Taps with Counter Support  - 1 x daily - 7 x weekly - 3 sets - 10 reps - Single Leg Stance with Support  - 1 x daily - 7 x weekly - 1 sets - 5 reps - 30 hold   GOALS: Goals reviewed with patient? Yes  SHORT TERM GOALS: Target date: 05/09/2022  Pt will perform initial HEP w/S* from daughter for improved strength, balance, transfers and gait.  Baseline: not established on eval  Goal status: INITIAL  2.  Pt will improve gait velocity to at least 2.2 ft/s with LRAD for improved gait efficiency and confidence with  capabilities   Baseline: 1.82 ft/s w/RW Goal status: INITIAL  3.  Pt will improve normal TUG to less than or equal to 20 seconds w/LRAD for improved functional mobility and decreased fall risk.  Baseline: 25.84s w/RW Goal status: INITIAL  4.  Berg to be assessed and LTG written Baseline:  Goal status: MET   LONG TERM GOALS: Target date: 05/23/2022  Pt  will be independent with final HEP for improved strength, balance, transfers and gait.  Baseline:  Goal status: INITIAL  2.  Pt will improve Berg score to >/= 46/56 to demonstrate decreased fall risk  Baseline: 38/56 (04/21/22) Goal status: INITIAL  3.  Pt will ambulate >500' on level and unlevel surfaces w/LRAD mod I for return to community mobility and improved visual scanning  Baseline:  Goal status: INITIAL  4.  Pt will improve 5 x STS to less than or equal to 15 seconds without UE support to demonstrate improved functional strength and transfer efficiency.   Baseline: 16.72s w/BUE support  Goal status: INITIAL  5.  Pt will improve gait velocity to at least 2.7 ft/s w/LRAD for improved gait efficiency and performance at community ambulator level   Baseline: 1.82 ft/s w/RW Goal status: INITIAL  6. Pt and daughter will verbalize fall prevention strategies to be used in the home and community environment for reduced fall risk   Baseline:   Goal Status: INITIAL   ASSESSMENT:  CLINICAL IMPRESSION: Emphasis of skilled PT session on assessing balance with BBS, assessing gait with no AD due to pt's family reporting she has been ambulating at times in the home with no AD, educating pt and family on fall risk and safety concerns, and establishing an HEP to work on balance, strength, and coordination. Pt scores 38/56 on the BBS, indicating a significant fall risk. Discussed results of BBS with patient and her son-in-law Vicente Males, they verbalized understanding. Created Berg LTG based on pt's score with goal of decreasing fall risk. During gait with no AD pt requires CGA for safety and exhibits some path deviation and decreased RLE clearance that increases with onset of fatigue. Educated pt and her family that therapy continues to recommend use of RW for independent mobility in the home as she is not safe by herself without RW at this time. Pt and family state their understanding and understand pt  will continue to work towards decreased reliance on AD. Provided HEP handout for patient and reviewed exercises, pt and family verbalize understanding. Continue POC.   OBJECTIVE IMPAIRMENTS decreased balance, decreased cognition, decreased coordination, decreased knowledge of condition, decreased knowledge of use of DME, difficulty walking, decreased safety awareness, impaired perceived functional ability, impaired sensation, and impaired vision/preception.   ACTIVITY LIMITATIONS lifting, bending, squatting, transfers, bed mobility, dressing, and caring for others  PARTICIPATION LIMITATIONS: meal prep, cleaning, laundry, medication management, personal finances, driving, shopping, community activity, and yard work  [place], Transportation, and 1 comorbidity: L inattention  are also affecting patient's functional outcome.   REHAB POTENTIAL: Good  CLINICAL DECISION MAKING: Stable/uncomplicated  EVALUATION COMPLEXITY: Low  PLAN: PT FREQUENCY: 2x/week  PT DURATION: 6 weeks  PLANNED INTERVENTIONS: Therapeutic exercises, Therapeutic activity, Neuromuscular re-education, Balance training, Gait training, Patient/Family education, Self Care, Joint mobilization, Visual/preceptual remediation/compensation, DME instructions, and Re-evaluation  PLAN FOR NEXT SESSION: assess HEP and add exercises/resistance as needed, work on L inattention, progress to gait w/reduced AD support (she has a cane), dynamic balance with decreased UE support     Excell Seltzer,  PT, DPT, CSRS 04/21/2022, 11:00 AM

## 2022-04-21 NOTE — Therapy (Signed)
OUTPATIENT SPEECH LANGUAGE PATHOLOGY TREATMENT   Patient Name: Tabitha Ramirez MRN: [id number] DOB:10-01-1945, 76 y.o., female Today's Date: 04/21/2022  PCP: [facility] PROVIDER: Cathlyn Parsons, PA-C    End of Session - 04/21/22 0929     Visit Number 3    Number of Visits 17    Date for SLP Re-Evaluation 06/06/22    Authorization Type [employer]    Progress Note Due on Visit 10    SLP Start Time 0929    SLP Stop Time  1015    SLP Time Calculation (min) 46 min    Activity Tolerance Patient tolerated treatment well               Past Medical History:  Diagnosis Date   Allergy    Anemia    Arthritis    "joints" (09/29/2013)   Breast cancer ([place])    GERD (gastroesophageal reflux disease)    Heart murmur    History of blood transfusion    "related to chemo/breast cancer" (09/29/2013)   Hypertension    Migraines    Personal history of chemotherapy    Personal history of radiation therapy    Radiation 11/05/13-12/24/13   Right breast    TMJ (temporomandibular joint syndrome)    "left; just dx'd" (09/29/2013   Past Surgical History:  Procedure Laterality Date   AXILLARY LYMPH NODE BIOPSY Right 03/10/2013   Procedure: AXILLARY LYMPH NODE BIOPSY;  Surgeon: Adin Hector, MD;  Location: [place];  Service: General;  Laterality: Right;  sentinel node with blue dye   BREAST BIOPSY     BREAST LUMPECTOMY Right 09/29/2013   needle localization w/axillary LND/notes 09/29/2013   BREAST LUMPECTOMY WITH NEEDLE LOCALIZATION AND AXILLARY LYMPH NODE DISSECTION Right 09/29/2013   Procedure: RIGHT BREAST NEEDLE LOCALIZED LUMPECTOMY AND AXILLARY LYMPH NODE DISSECTION;  Surgeon: Adin Hector, MD;  Location: [place];  Service: General;  Laterality: Right;   COLONOSCOPY N/A 04/30/2013   Procedure: COLONOSCOPY;  Surgeon: Wonda Horner, MD;  Location: [facility];  Service: Endoscopy;  Laterality: N/A;   CRANIOTOMY Right 03/11/2022   Procedure: CRANIOTOMY TUMOR EXCISION;   Surgeon: Meade Maw, MD;  Location: [facility];  Service: Neurosurgery;  Laterality: Right;   ESOPHAGOGASTRODUODENOSCOPY N/A 04/28/2013   Procedure: ESOPHAGOGASTRODUODENOSCOPY (EGD);  Surgeon: Wonda Horner, MD;  Location: [facility];  Service: Endoscopy;  Laterality: N/A;   MASTECTOMY Right 09/29/2013   "partial"   PORT-A-CATH REMOVAL  09/29/2013   PORT-A-CATH REMOVAL Left 09/29/2013   Procedure: REMOVAL PORT-A-CATH;  Surgeon: Adin Hector, MD;  Location: [place];  Service: General;  Laterality: Left;   PORTACATH PLACEMENT N/A 03/10/2013   Procedure: INSERTION PORT-A-CATH WITH FLUOROSCOPY AND ULTRASOUND;  Surgeon: Adin Hector, MD;  Location: [place];  Service: General;  Laterality: N/A;   SURAL NERVE BX Right 05/13/2014   Procedure: SURAL NERVE BIOPSY;  Surgeon: Hosie Spangle, MD;  Location: [facility];  Service: Neurosurgery;  Laterality: Right;  Right sural nerve biopsy   TONSILLECTOMY     TOTAL ABDOMINAL HYSTERECTOMY     With bilateral salpingo-oophorectomy   Patient Active Problem List   Diagnosis Date Noted   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, right ([place]) 04/13/2022   Brain tumor ([place]) 03/17/2022   Palliative care encounter    Brain mass 03/09/2022   Seizure ([place]) 03/09/2022   Neuropathy 06/11/2014   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   Gait abnormality 01/26/2014   Unspecified hereditary and idiopathic  peripheral neuropathy 01/26/2014   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[place]) 01/20/2014   Neuropathic pain 01/20/2014   Anxiety 01/20/2014   Unspecified deficiency anemia 01/20/2014   Hx of neutropenia 08/04/2013   Jaw pain 08/04/2013   Leukocytosis 04/27/2013   Pericardial effusion 04/27/2013   Cancer of central portion of right female breast ([place]) 02/04/2013   Need for influenza vaccination 11/29/2011   Hypertriglyceridemia 03/07/2011   Post-menopausal 03/07/2011   Anemia 12/17/2010   Heart murmur 12/17/2010   Seasonal  allergies 12/17/2010   HYPOKALEMIA 02/19/2009   Essential hypertension, benign 04/09/2008    ONSET DATE: 03-09-2022   REFERRING DIAG: D49.6 (ICD-10-CM) - Brain tumor ([place])   THERAPY DIAG:  Cognitive communication deficit  Rationale for Evaluation and Treatment Rehabilitation  SUBJECTIVE:   SUBJECTIVE STATEMENT: "Working on my homework"  PAIN:  Are you having pain? No  OBJECTIVE:   TODAY'S TREATMENT:  04-21-22: Pt returns with completed medication chart in which all medications are listed with their purpose, dose, and # of pills needs for AM & PM. Based on medications listed in chart, completed chart is 100% accurate with no overt signs of deviations made during completion at home. Using chart, pt able to ID amount of pills which should be in AM and PM slots of pill box. Pt reports to having successfully sorted medications into pill box this past week with daughter's supervision. Target attention, functional math, and problem solving through bill pay activity. Pt able to name x8 personal bills and provide estimate for their amount. Using calculator, correctly totals bill amount IND. Calculates amount remaining in account following payment of all bills accurately IND. With questioning cues to aid in completeness, pt able to verbalize her bill paying routine.   04-18-22: Reports to taking medications out of pill box, after daughter was sorted, set out, and reminded regarding admin time, and taking them. She is ensuring she takes all pills out of the correct slot. Target medication management this session. SLP led pt through simulated reading of medication labels. Able to answer questions using medication labels with 100% accuracy. SLP provided pt with medication list chart. Pt able to ID where information would be penciled in with 75% accuracy. Difficulties with deciding how pills would be sorted into AM/PM medication box per label instructions. SLP provides further instruction and sends  medication list home with pt for completion with own medications as HEP. Pt able to complete medication scheduling worksheet with 100% accuracy, demonstrating attention to detail and working memory adequate for task, with occasional min-A.    PATIENT EDUCATION: Education details: see above Person educated: Patient and Caregiver daughter Education method: Customer service manager Education comprehension: verbalized understanding, returned demonstration, and needs further education  GOALS: Goals reviewed with patient? Yes  SHORT TERM GOALS: Target date: 05/09/2022  Pt will teach back attention and memory compensations and strategies to aid in increased independence at home given rare min-A.  Baseline: Goal status: IN PROGRESS  2.  Pt will design bill pay system to aid in successful and accurate completion of all bills with occasional min-A over 2 sessions.  Baseline:  Goal status: IN PROGRESS  3.  Pt will accurately sort medications, using targeted strategies, into weekly pill box with no more than 3 verbal cues.  Baseline:  Goal status: IN PROGRESS  4.  Pt will teach back meta-cognitive strategies to aid in increased independence at home during execution of leisure tasks with rare min-A.  Baseline:  Goal status: IN PROGRESS  5.  Pt will complete CLQT during first therapy session.   Baseline: 04-18-22  Goal Status: Met  LONG TERM GOALS: Target date: 06/06/2022  Pt will report carryover of attention and memory compensations, with benefit, to aid in recall of prospective memory tasks over 1 week period.  Baseline:  Goal status: IN PROGRESS  2.  Pt will carryover bill pay system to accurately pay all bills with distant supervision from daughter over 1 week period.  Baseline:  Goal status: IN PROGRESS  3.  Pt and daughter will report successful increased independence from baseline of total-A for medication management, evidenced by no missed doses over 1 week period.   Baseline:  Goal status: IN PROGRESS  4.  Pt will report 2 pt improvement via Cognitive Function PROM by d/c.  Baseline:  Goal status: IN PROGRESS  ASSESSMENT:  CLINICAL IMPRESSION: Patient is a 76 y.o. F who was seen today for cognitive communication deficits s/p recitation of R parietal lobe brain tumor. Prior to event, pt with full independence, lived alone, managed all aspects of home and schedule. Currently, daughter, son-in-law, and grandson are residing with pt to assist in rehabilitation. Overall, pt and daughter report pt is returning to baseline with improved processing and cognitive function. Some residual deviation from baseline reported in areas of attention, memory, and processing. Pt is requiring additional time to problem solve complex problems, decreased memory and retention of novel information, impaired prospective memory. Requires total-A for medication and financial management, desires return to previous status of full independence. SLP completed CLQT, pt with moderate cognitive impairment, see note for details. Initiated training for increased independence in medication management. I recommend skilled ST to target cognitive deficits in effort to increase independence, reduce caregiver burden, and facilitate return to participation in desired tasks.    OBJECTIVE IMPAIRMENTS include attention, memory, awareness, and executive functioning. These impairments are limiting patient from managing medications, managing appointments, managing finances, and household responsibilities. Factors affecting potential to achieve goals and functional outcome are medical prognosis.. Patient will benefit from skilled SLP services to address above impairments and improve overall function.  REHAB POTENTIAL: Good  PLAN: SLP FREQUENCY: 2x/week  SLP DURATION: 8 weeks  PLANNED INTERVENTIONS: Cueing hierachy, Cognitive reorganization, Internal/external aids, Functional tasks, SLP instruction and  feedback, Compensatory strategies, and Patient/family education    Su Monks, CCC-SLP 04/21/2022, 9:30 AM

## 2022-04-21 NOTE — Patient Instructions (Addendum)
[employer] games Jig saw puzzles Easy cross words Memory match Board games [employer] Learn a new game!  Listen to and discuss Ted Talks or Podcasts Read and discuss short articles of interest to you- Take notes on these if memory is a challenge Discuss social media posts Look and discuss photo albums  The best activities to improve cognition are functional, real life activities that are important to you:  Plan a menu Participate in household chores and decisions (with supervision) Participate in Reserve a party, trip or tailgate with all of the details (even if you aren't really going to carry it out) Participate in your hobby as you are able with assistance Manage your texts, emails with supervision if needed. Google search for items (even if you're not really going to buy anything) and compare prices and features Socialize   It's good to use real in-person games, not just apps  Apps:  [employer]

## 2022-04-24 ENCOUNTER — Ambulatory Visit
Admission: RE | Admit: 2022-04-24 | Discharge: 2022-04-24 | Disposition: A | Discharge status: Home / Self Care | Payer: Medicare PPO | Source: Ambulatory Visit | Attending: Radiation Oncology | Admitting: Radiation Oncology

## 2022-04-24 DIAGNOSIS — C7931 Secondary malignant neoplasm of brain: Secondary | ICD-10-CM

## 2022-04-24 MED ORDER — GADOBENATE DIMEGLUMINE 529 MG/ML IV SOLN
14.0000 mL | Freq: Once | INTRAVENOUS | Status: AC | PRN
  Administered 2022-04-24: 14 mL via INTRAVENOUS

## 2022-04-25 ENCOUNTER — Other Ambulatory Visit: Payer: Self-pay

## 2022-04-25 ENCOUNTER — Ambulatory Visit
Admission: RE | Admit: 2022-04-25 | Discharge: 2022-04-25 | Disposition: A | Discharge status: Home / Self Care | Payer: Medicare PPO | Source: Ambulatory Visit | Attending: Radiation Oncology | Admitting: Radiation Oncology

## 2022-04-25 VITALS — BP 161/87 | HR 80 | Temp 96.9°F | Resp 18 | Ht 64.0 in | Wt 141.0 lb

## 2022-04-25 DIAGNOSIS — Z510 Encounter for antineoplastic radiation therapy: Secondary | ICD-10-CM | POA: Insufficient documentation

## 2022-04-25 DIAGNOSIS — C50911 Malignant neoplasm of unspecified site of right female breast: Secondary | ICD-10-CM

## 2022-04-25 DIAGNOSIS — C7931 Secondary malignant neoplasm of brain: Secondary | ICD-10-CM | POA: Insufficient documentation

## 2022-04-25 MED ORDER — SODIUM CHLORIDE 0.9% FLUSH
10.0000 mL | INTRAVENOUS | Status: DC | PRN
  Administered 2022-04-25: 10 mL via INTRAVENOUS

## 2022-04-25 NOTE — Progress Notes (Signed)
Has armband been applied?  Yes.    Does patient have an allergy to IV contrast dye?: No.   Has patient ever received premedication for IV contrast dye?: No.   Does patient take metformin?: No.  If patient does take metformin when was the last dose: No- None  Date of lab work: March 27, 2022 BUN: 24 CR: 1.03  IV site: hand left, condition patent and no redness  Has IV site been added to flowsheet?  Yes.    BP (!) 161/87 (BP Location: Left Arm, Patient Position: Sitting, Cuff Size: Normal)   Pulse 80   Temp (!) 96.9 F (36.1 C) (Temporal)   Resp 18   Ht '5\' 4"'$  (1.626 m)   Wt 141 lb (64 kg)   SpO2 100%   BMI 24.20 kg/m

## 2022-04-25 NOTE — Progress Notes (Signed)
  Radiation Oncology         [phone] ________________________________  Name: Tabitha Ramirez MRN: [id number]  Date: 04/25/2022  DOB: 1946/06/23  SIMULATION AND TREATMENT PLANNING NOTE    ICD-10-CM   1. 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  NARRATIVE:  The patient was brought to the [facility].  Identity was confirmed.  All relevant records and images related to the planned course of therapy were reviewed.  The patient freely provided informed written consent to proceed with treatment after reviewing the details related to the planned course of therapy. The consent form was witnessed and verified by the simulation staff. Intravenous access was established for contrast administration. Then, the patient was set-up in a stable reproducible supine position for radiation therapy.  A relocatable thermoplastic stereotactic head frame was fabricated for precise immobilization.  CT images were obtained.  Surface markings were placed.  The CT images were loaded into the planning software and fused with the patient's targeting MRI scan.  Then the target and avoidance structures were contoured.  Treatment planning then occurred.  The radiation prescription was entered and confirmed.  I have requested 3D planning  I have requested a DVH of the following structures: Brain stem, brain, left eye, right eye, lenses, optic chiasm, target volumes, uninvolved brain, and normal tissue.    SPECIAL TREATMENT PROCEDURE:  The planned course of therapy using radiation constitutes a special treatment procedure. Special care is required in the management of this patient for the following reasons. This treatment constitutes a Special Treatment Procedure for the following reason: High dose per fraction requiring special monitoring for increased  toxicities of treatment including daily imaging.  The special nature of the planned course of radiotherapy will require increased physician supervision and oversight to ensure patient's safety with optimal treatment outcomes.  This requires extended time and effort.  PLAN:  The patient will receive 27 Gy in 3 fractions.  ________________________________  Sheral Apley Tammi Klippel, M.D.

## 2022-04-26 ENCOUNTER — Ambulatory Visit: Payer: Medicare PPO | Admitting: Radiation Oncology

## 2022-04-26 ENCOUNTER — Other Ambulatory Visit: Payer: Self-pay | Admitting: Registered Nurse

## 2022-04-26 ENCOUNTER — Telehealth: Payer: Self-pay | Admitting: Registered Nurse

## 2022-04-26 DIAGNOSIS — Z510 Encounter for antineoplastic radiation therapy: Secondary | ICD-10-CM | POA: Diagnosis not present

## 2022-04-26 MED ORDER — LEVETIRACETAM 500 MG PO TABS: 500.0000 mg | ORAL_TABLET | Freq: Two times a day (BID) | ORAL | 0 refills | Status: DC

## 2022-04-26 MED ORDER — LEVOTHYROXINE SODIUM 50 MCG PO TABS: 50.0000 ug | ORAL_TABLET | Freq: Every day | ORAL | 0 refills | Status: DC

## 2022-04-26 MED ORDER — ROSUVASTATIN CALCIUM 10 MG PO TABS: 10.0000 mg | ORAL_TABLET | Freq: Every day | ORAL | 0 refills | Status: DC

## 2022-04-26 NOTE — Telephone Encounter (Signed)
Discharge Summary was Reviewed.  Crestor and Synthroid ordered.  Daughter is aware via My-Chart

## 2022-04-27 ENCOUNTER — Other Ambulatory Visit: Payer: Self-pay

## 2022-04-27 ENCOUNTER — Ambulatory Visit
Admission: RE | Admit: 2022-04-27 | Discharge: 2022-04-27 | Disposition: A | Discharge status: Home / Self Care | Payer: Medicare PPO | Source: Ambulatory Visit | Attending: Radiation Oncology | Admitting: Radiation Oncology

## 2022-04-27 DIAGNOSIS — C50911 Malignant neoplasm of unspecified site of right female breast: Secondary | ICD-10-CM

## 2022-04-27 DIAGNOSIS — Z510 Encounter for antineoplastic radiation therapy: Secondary | ICD-10-CM | POA: Diagnosis not present

## 2022-04-27 LAB — RAD ONC ARIA SESSION SUMMARY
Course Elapsed Days: 0
Plan Fractions Treated to Date: 1
Plan Prescribed Dose Per Fraction: 9 Gy
Plan Total Fractions Prescribed: 3
Plan Total Prescribed Dose: 27 Gy
Reference Point Dosage Given to Date: 9 Gy
Reference Point Session Dosage Given: 9 Gy
Session Number: 1

## 2022-04-27 NOTE — Progress Notes (Signed)
Patient to clinic for 15 minute observation [place] [facility].  Denies headache, fatigue,nausea, dizziness, visual changes, and ringing in ears.  Ambulates with walker out clinic without difficulty.  Daughter at her side.  Vitals:  98.5-70-18-168/84. O2 sat 100%.

## 2022-04-28 ENCOUNTER — Ambulatory Visit: Payer: Medicare PPO | Admitting: Radiation Oncology

## 2022-04-29 NOTE — Progress Notes (Signed)
  Radiation Oncology         [phone]) [phone] ________________________________  Stereotactic Treatment Procedure Note  Name: Tabitha Ramirez MRN: [id number]  Date: 04/27/2022  DOB: May 07, 1946  SPECIAL TREATMENT PROCEDURE    ICD-10-CM   1. 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  It showed 3-dimensional radiation distributions overlaid onto the planning CT/MRI image set.  The [facility] for the target structures as well as the organs at risk were reviewed. The documentation of the 3D plan and dosimetry are filed in the radiation oncology EMR.  NARRATIVE:  Tabitha Ramirez was brought to the TrueBeam stereotactic radiation treatment machine and placed supine on the CT couch. The head frame was applied, and the patient was set up for stereotactic radiosurgery.  Neurosurgery was present for the set-up and delivery  SIMULATION VERIFICATION:  In the couch zero-angle position, the patient underwent Exactrac imaging using the Brainlab system with orthogonal KV images.  These were carefully aligned and repeated to confirm treatment position for each of the isocenters.  The Exactrac snap film verification was repeated at each couch angle.  PROCEDURE: Tabitha Ramirez received stereotactic radiosurgery to the following targets: Right parietal 30 mm resection cavity target was treated using 4 Rapid Arc VMAT Beams to a prescription dose of 9 Gy to be repeated 3 times for a total dose of 27 Gy.  ExacTrac registration was performed for each couch angle.  The 100% isodose line was prescribed.  6 MV X-rays were delivered in the flattening filter free beam mode.  STEREOTACTIC TREATMENT MANAGEMENT:  Following delivery, the patient was transported to nursing in stable condition and monitored for possible acute effects.  Vital signs  were recorded. The patient tolerated treatment without significant acute effects, and was discharged to home in stable condition.    PLAN: Follow-up in one month.  ________________________________  Sheral Apley. Tammi Klippel, M.D.

## 2022-05-01 ENCOUNTER — Ambulatory Visit
Admission: RE | Admit: 2022-05-01 | Discharge: 2022-05-01 | Disposition: A | Discharge status: Home / Self Care | Payer: Medicare PPO | Source: Ambulatory Visit | Attending: Radiation Oncology | Admitting: Radiation Oncology

## 2022-05-01 ENCOUNTER — Other Ambulatory Visit: Payer: Self-pay

## 2022-05-01 VITALS — BP 175/92 | HR 70 | Temp 97.8°F | Resp 20

## 2022-05-01 DIAGNOSIS — Z510 Encounter for antineoplastic radiation therapy: Secondary | ICD-10-CM | POA: Diagnosis not present

## 2022-05-01 DIAGNOSIS — C50911 Malignant neoplasm of unspecified site of right female breast: Secondary | ICD-10-CM

## 2022-05-01 LAB — RAD ONC ARIA SESSION SUMMARY
Course Elapsed Days: 4
Plan Fractions Treated to Date: 2
Plan Prescribed Dose Per Fraction: 9 Gy
Plan Total Fractions Prescribed: 3
Plan Total Prescribed Dose: 27 Gy
Reference Point Dosage Given to Date: 18 Gy
Reference Point Session Dosage Given: 9 Gy
Session Number: 2

## 2022-05-01 NOTE — Progress Notes (Signed)
Patient to clinic for 15 minute observation [place] [facility].  Had some leaning towards left walking into clinic per radiation therapist.  Denies headache, fatigue,nausea, dizziness, visual changes, and ringing in ears.  Ambulates with walker out clinic standing straight up.  Not on Decadron.  Advised not to do anything strenuous for 24 hours.  Call [phone] is anything changes.  Vitals:  97.8-70-20-175/92.  Advised patient and daughter to see PCP to address increase BP.

## 2022-05-02 ENCOUNTER — Telehealth: Payer: Self-pay | Admitting: Radiation Therapy

## 2022-05-02 ENCOUNTER — Ambulatory Visit: Payer: Medicare PPO | Attending: Physician Assistant | Admitting: Occupational Therapy

## 2022-05-02 ENCOUNTER — Encounter: Payer: Self-pay | Admitting: Physical Therapy

## 2022-05-02 ENCOUNTER — Ambulatory Visit: Payer: Medicare PPO | Admitting: Physical Therapy

## 2022-05-02 ENCOUNTER — Encounter: Payer: Self-pay | Admitting: Occupational Therapy

## 2022-05-02 DIAGNOSIS — D496 Neoplasm of unspecified behavior of brain: Secondary | ICD-10-CM | POA: Diagnosis present

## 2022-05-02 DIAGNOSIS — R2689 Other abnormalities of gait and mobility: Secondary | ICD-10-CM | POA: Diagnosis present

## 2022-05-02 DIAGNOSIS — R41841 Cognitive communication deficit: Secondary | ICD-10-CM | POA: Diagnosis present

## 2022-05-02 DIAGNOSIS — R278 Other lack of coordination: Secondary | ICD-10-CM | POA: Diagnosis present

## 2022-05-02 DIAGNOSIS — R2681 Unsteadiness on feet: Secondary | ICD-10-CM

## 2022-05-02 DIAGNOSIS — R208 Other disturbances of skin sensation: Secondary | ICD-10-CM | POA: Insufficient documentation

## 2022-05-02 DIAGNOSIS — M6281 Muscle weakness (generalized): Secondary | ICD-10-CM

## 2022-05-02 DIAGNOSIS — R414 Neurologic neglect syndrome: Secondary | ICD-10-CM | POA: Insufficient documentation

## 2022-05-02 NOTE — Telephone Encounter (Signed)
I called to check in with Ms. Savary daughter, [name] [place] [phone]), to see how Ms. Marchetti was doing and if her balance has improved since we saw her yesterday. [name] verified that the patient is not currently taking any steroids. [name] said that Ms. Calvert had a much better evening last night and her balance is much improved. She was in the middle of a PT appointment when I called, and doing very well.  I encouraged [name] to give me a call or send an e-mail if anything changes, otherwise we will see them both for her final treatment tomorrow.    Mont Dutton R.T.(R)(T) Radiation Special Procedures Navigator

## 2022-05-02 NOTE — Patient Instructions (Signed)
  VISUAL SCANNING STRATEGIES  1. Look for the edge of objects (to the left and/or right) so that you make sure you are seeing all of an object 2. Turn your head when walking, scan from side to side, particularly in busy environments 3. Use an organized scanning pattern. It's usually easier to scan from top to bottom, and left to right (like you are reading) 4. Double check yourself 5. Use a line guide (like a blank piece of paper) or your finger when reading 6. If necessary, place brightly colored tape at end of table or work area as a reminder to always look until you see the tape.    Activities to try at home to encourage visual scanning:   1. Word searches 2. Mazes 3. Puzzles 4. Card games 5. Computer games and/or searches 6. Connect-the-dots  Activities for environmental (larger) scanning:  With supervision, scan for items in grocery store or drugstore.  Begin with a familiar store, then progress to a new store you've never been in before. Make sure you have supervision with this.

## 2022-05-02 NOTE — Therapy (Signed)
OUTPATIENT OCCUPATIONAL THERAPY NEURO TREATMENT  Patient Name: Tabitha Ramirez MRN: [id number] DOB:10/28/1945, 76 y.o., female Today's Date: 05/02/2022  PCP: [facility] primary care REFERRING PROVIDER: Dr. Naaman Plummer   OT End of Session - 05/02/22 0937     Visit Number 2    Number of Visits 25    Date for OT Re-Evaluation 07/04/22    Authorization Type [employer]    OT Start Time 0935    OT Stop Time 1015    OT Time Calculation (min) 40 min    Activity Tolerance Patient tolerated treatment well    Behavior During Therapy [facility] for tasks assessed/performed;Impulsive             Past Medical History:  Diagnosis Date   Allergy    Anemia    Arthritis    "joints" (09/29/2013)   Breast cancer ([place])    GERD (gastroesophageal reflux disease)    Heart murmur    History of blood transfusion    "related to chemo/breast cancer" (09/29/2013)   Hypertension    Migraines    Personal history of chemotherapy    Personal history of radiation therapy    Radiation 11/05/13-12/24/13   Right breast    TMJ (temporomandibular joint syndrome)    "left; just dx'd" (09/29/2013   Past Surgical History:  Procedure Laterality Date   AXILLARY LYMPH NODE BIOPSY Right 03/10/2013   Procedure: AXILLARY LYMPH NODE BIOPSY;  Surgeon: Adin Hector, MD;  Location: [place];  Service: General;  Laterality: Right;  sentinel node with blue dye   BREAST BIOPSY     BREAST LUMPECTOMY Right 09/29/2013   needle localization w/axillary LND/notes 09/29/2013   BREAST LUMPECTOMY WITH NEEDLE LOCALIZATION AND AXILLARY LYMPH NODE DISSECTION Right 09/29/2013   Procedure: RIGHT BREAST NEEDLE LOCALIZED LUMPECTOMY AND AXILLARY LYMPH NODE DISSECTION;  Surgeon: Adin Hector, MD;  Location: [place];  Service: General;  Laterality: Right;   COLONOSCOPY N/A 04/30/2013   Procedure: COLONOSCOPY;  Surgeon: Wonda Horner, MD;  [facility];  Service: Endoscopy;  Laterality: N/A;   CRANIOTOMY Right 03/11/2022   Procedure: CRANIOTOMY TUMOR EXCISION;   Surgeon: Meade Maw, MD;  Location: [facility];  Service: Neurosurgery;  Laterality: Right;   ESOPHAGOGASTRODUODENOSCOPY N/A 04/28/2013   Procedure: ESOPHAGOGASTRODUODENOSCOPY (EGD);  Surgeon: Wonda Horner, MD;  Location: [facility];  Service: Endoscopy;  Laterality: N/A;   MASTECTOMY Right 09/29/2013   "partial"   PORT-A-CATH REMOVAL  09/29/2013   PORT-A-CATH REMOVAL Left 09/29/2013   Procedure: REMOVAL PORT-A-CATH;  Surgeon: Adin Hector, MD;  Location: [place];  Service: General;  Laterality: Left;   PORTACATH PLACEMENT N/A 03/10/2013   Procedure: INSERTION PORT-A-CATH WITH FLUOROSCOPY AND ULTRASOUND;  Surgeon: Adin Hector, MD;  Location: [place];  Service: General;  Laterality: N/A;   SURAL NERVE BX Right 05/13/2014   Procedure: SURAL NERVE BIOPSY;  Surgeon: Hosie Spangle, MD;  [facility];  Service: Neurosurgery;  Laterality: Right;  Right sural nerve biopsy   TONSILLECTOMY     TOTAL ABDOMINAL HYSTERECTOMY     With bilateral salpingo-oophorectomy   Patient Active Problem List   Diagnosis Date Noted   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, right ([place]) 04/13/2022   Brain tumor ([place]) 03/17/2022   Palliative care encounter    Brain mass 03/09/2022   Seizure ([place]) 03/09/2022   Neuropathy 06/11/2014   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   Gait abnormality 01/26/2014   Unspecified hereditary and idiopathic peripheral neuropathy 01/26/2014  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[place]) 01/20/2014   Neuropathic pain 01/20/2014   Anxiety 01/20/2014   Unspecified deficiency anemia 01/20/2014   Hx of neutropenia 08/04/2013   Jaw pain 08/04/2013   Leukocytosis 04/27/2013   Pericardial effusion 04/27/2013   Cancer of central portion of right female breast ([place]) 02/04/2013   Need for influenza vaccination 11/29/2011   Hypertriglyceridemia 03/07/2011   Post-menopausal 03/07/2011   Anemia 12/17/2010   Heart murmur 12/17/2010   Seasonal  allergies 12/17/2010   HYPOKALEMIA 02/19/2009   Essential hypertension, benign 04/09/2008    ONSET DATE: 03/11/22, craniotomy  REFERRING DIAG: D49.6 (ICD-10-CM) - Brain tumor ([place])  THERAPY DIAG:  Muscle weakness (generalized)  Other lack of coordination  Neurologic neglect syndrome  Rationale for Evaluation and Treatment Rehabilitation  SUBJECTIVE:   SUBJECTIVE STATEMENT: To be more independent Pt accompanied by: family member  PERTINENT HISTORY:  PMH significant for breast CA in remission for 7 years,  status post lobectomy, chemotherapy, radiation, hypertension, hypothyroidism, severe peripheral neuropathy status post IVIG, hx of seizure. 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 Pt was seen on CIR and d/c home 03/27/22  PRECAUTIONS: Fall  WEIGHT BEARING RESTRICTIONS No  PAIN:  Are you having pain? No   PATIENT GOALS to be more independent  OBJECTIVE:   HAND DOMINANCE: Right  HAND FUNCTION: Grip strength: Right: 36.5 lbs; Left: 41.0 lbs  COORDINATION: 9 Hole Peg test: Right: 1 min 43 secs  Left: 7pegs in 2 min   Box and Blocks:  Right 37 blocks, Left  20 blocks   VISION ASSESSMENT: Visual Fields: per chart left visual field deficit,  pt's dtr reports L neglect  PERCEPTION: Impaired: Inattention/neglect: does not attend to left side of body   TODAY'S TREATMENT:   Assessed grip strength: Rt = 36.5 lbs, Lt = 41.0 lbs  Letter cancellation (72M print size) w/ difficulty and adapted grip holding highlighter Rt hand: pt missed 1 on first page and 2 on 2nd page (average 95% accuracy) w/ extra time required   PATIENT EDUCATION: Education details: Visual scanning strategies and ways to encouraged visual scanning  Person educated: Patient Education method: Explanation and Handouts Education comprehension: verbalized understanding    HOME EXERCISE PROGRAM: 05/02/22: Visual scanning strategies   GOALS:   SHORT TERM GOALS: Target date:    I with  initial HEP  Goal status: INITIAL  2.  Pt will perform dressing with supervision only.  Goal status: INITIAL  3.  Pt will verbalize understanding of compensatory strategies for visual deficits/ left neglect and sensory deficits.  Goal status: INITIAL  4.  Pt will demonstrate improved bilateral UE functional use as evidenced by increasing box/ blocks score by 5 blocks bilaterally. Baseline: Box and Blocks:  Right 37 blocks, Left  20 blocks Goal status: INITIAL  5.  Pt will perform functional tabletop scanning activities with 90% or better accuracy  Goal status: INITIAL  6.  Improve grip strength Rt hand by 5 lbs  Goal status: INITIAL  LONG TERM GOALS: Target date: 07/04/22  I with updated HEP.  Goal status: INITIAL  2.  Pt will perform all basic ADLs mod I   Goal status: INITIAL  3.  Pt will perform basic home management with supervision demonstrating good safety awareness.  Goal status: INITIAL  4.  Pt will perform environmental scanning in a mod distracting environment with 90% accuracy and no LOB.  Goal status: INITIAL  5.  Pt will perform basic cooking task with min  A demonstrating good safety awareness  Goal status: INITIAL  6.  Pt will demonstrate improved LUE fine motor coordination as evidenced by placing 9 pegs in 2 mins. Baseline: 7 pegs in 2 mins Goal status: INITIAL 7. Pt  will demonstrate improved fine motor coordination as evidenced by decreasing 9 hole peg test score to 90 secs or less  Baseline: 1 min 43 secs initial   Goal status: initial  ASSESSMENT:  CLINICAL IMPRESSION: Pt returns today after evaluation for further assessment of grip strength and tabletop scanning. Updated STG #6. Tabletop scanning fairly accurate  PERFORMANCE DEFICITS in functional skills including ADLs, IADLs, coordination, dexterity, proprioception, sensation, strength, FMC, GMC, mobility, balance, endurance, decreased knowledge of precautions, decreased knowledge of use  of DME, vision, and UE functional use, cognitive skills including attention, learn, memory, perception, problem solving, safety awareness, sequencing, thought, and understand, and psychosocial skills including coping strategies, environmental adaptation, habits, interpersonal interactions, and routines and behaviors.   IMPAIRMENTS are limiting patient from ADLs, IADLs, play, leisure, and social participation.   COMORBIDITIES may have co-morbidities  that affects occupational performance. Patient will benefit from skilled OT to address above impairments and improve overall function.  MODIFICATION OR ASSISTANCE TO COMPLETE EVALUATION: No modification of tasks or assist necessary to complete an evaluation.  OT OCCUPATIONAL PROFILE AND HISTORY: Detailed assessment: Review of records and additional review of physical, cognitive, psychosocial history related to current functional performance.  CLINICAL DECISION MAKING: LOW - limited treatment options, no task modification necessary  REHAB POTENTIAL: Good  EVALUATION COMPLEXITY: Low    PLAN: OT FREQUENCY: 2x/week  OT DURATION: 12 weeks plus eval  PLANNED INTERVENTIONS: self care/ADL training, therapeutic exercise, therapeutic activity, neuromuscular re-education, manual therapy, passive range of motion, gait training, balance training, functional mobility training, aquatic therapy, fluidotherapy, moist heat, cryotherapy, patient/family education, cognitive remediation/compensation, visual/perceptual remediation/compensation, energy conservation, coping strategies training, DME and/or AE instructions, and Re-evaluation  RECOMMENDED OTHER SERVICES: PT, ST  CONSULTED AND AGREED WITH PLAN OF CARE: Patient and family member/caregiver  PLAN FOR NEXT SESSION: issue simple coordination HEP, continue visual scanning (near body and environmental)   Hans Eden, OT 05/02/2022, 9:38 AM

## 2022-05-02 NOTE — Therapy (Addendum)
OUTPATIENT PHYSICAL THERAPY NEURO TREATMENT   Patient Name: Tabitha Ramirez MRN: [id number] DOB:[birth year], 76 y.o., female Today's Date: 05/02/2022   PCP: [facility]  REFERRING PROVIDER: Cathlyn Parsons, PA-C    PT End of Session - 05/02/22 1109     Visit Number 3    Number of Visits 13   plus eval   Date for PT Re-Evaluation 05/30/22    Authorization Type [employer]    Progress Note Due on Visit 10    PT Start Time 1017    PT Stop Time 1103    PT Time Calculation (min) 46 min    Equipment Utilized During Treatment Gait belt    Activity Tolerance Patient tolerated treatment well    Behavior During Therapy WFL for tasks assessed/performed               Past Medical History:  Diagnosis Date   Allergy    Anemia    Arthritis    "joints" (09/29/2013)   Breast cancer ([place])    GERD (gastroesophageal reflux disease)    Heart murmur    History of blood transfusion    "related to chemo/breast cancer" (09/29/2013)   Hypertension    Migraines    Personal history of chemotherapy    Personal history of radiation therapy    Radiation 11/05/13-12/24/13   Right breast    TMJ (temporomandibular joint syndrome)    "left; just dx'd" (09/29/2013   Past Surgical History:  Procedure Laterality Date   AXILLARY LYMPH NODE BIOPSY Right 03/10/2013   Procedure: AXILLARY LYMPH NODE BIOPSY;  Surgeon: Adin Hector, MD;  Location: [place];  Service: General;  Laterality: Right;  sentinel node with blue dye   BREAST BIOPSY     BREAST LUMPECTOMY Right 09/29/2013   needle localization w/axillary LND/notes 09/29/2013   BREAST LUMPECTOMY WITH NEEDLE LOCALIZATION AND AXILLARY LYMPH NODE DISSECTION Right 09/29/2013   Procedure: RIGHT BREAST NEEDLE LOCALIZED LUMPECTOMY AND AXILLARY LYMPH NODE DISSECTION;  Surgeon: Adin Hector, MD;  Location: [place];  Service: General;  Laterality: Right;   COLONOSCOPY N/A 04/30/2013   Procedure: COLONOSCOPY;  Surgeon: Wonda Horner, MD;   [facility];  Service: Endoscopy;  Laterality: N/A;   CRANIOTOMY Right 03/11/2022   Procedure: CRANIOTOMY TUMOR EXCISION;  Surgeon: Meade Maw, MD;  Location: [facility];  Service: Neurosurgery;  Laterality: Right;   ESOPHAGOGASTRODUODENOSCOPY N/A 04/28/2013   Procedure: ESOPHAGOGASTRODUODENOSCOPY (EGD);  Surgeon: Wonda Horner, MD;  [facility];  Service: Endoscopy;  Laterality: N/A;   MASTECTOMY Right 09/29/2013   "partial"   PORT-A-CATH REMOVAL  09/29/2013   PORT-A-CATH REMOVAL Left 09/29/2013   Procedure: REMOVAL PORT-A-CATH;  Surgeon: Adin Hector, MD;  Location: [place];  Service: General;  Laterality: Left;   PORTACATH PLACEMENT N/A 03/10/2013   Procedure: INSERTION PORT-A-CATH WITH FLUOROSCOPY AND ULTRASOUND;  Surgeon: Adin Hector, MD;  Location: [place];  Service: General;  Laterality: N/A;   SURAL NERVE BX Right 05/13/2014   Procedure: SURAL NERVE BIOPSY;  Surgeon: Hosie Spangle, MD;  [facility];  Service: Neurosurgery;  Laterality: Right;  Right sural nerve biopsy   TONSILLECTOMY     TOTAL ABDOMINAL HYSTERECTOMY     With bilateral salpingo-oophorectomy   Patient Active Problem List   Diagnosis Date Noted   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) 04/13/2022   Brain tumor ([place]) 03/17/2022   Palliative care encounter    Brain mass 03/09/2022  Seizure ([place]) 03/09/2022   Neuropathy 06/11/2014   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   Gait abnormality 01/26/2014   Unspecified hereditary and idiopathic peripheral neuropathy 01/26/2014   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[place]) 01/20/2014   Neuropathic pain 01/20/2014   Anxiety 01/20/2014   Unspecified deficiency anemia 01/20/2014   Hx of neutropenia 08/04/2013   Jaw pain 08/04/2013   Leukocytosis 04/27/2013   Pericardial effusion 04/27/2013   Cancer of central portion of right female breast ([place]) 02/04/2013   Need for influenza vaccination  11/29/2011   Hypertriglyceridemia 03/07/2011   Post-menopausal 03/07/2011   Anemia 12/17/2010   Heart murmur 12/17/2010   Seasonal allergies 12/17/2010   HYPOKALEMIA 02/19/2009   Essential hypertension, benign 04/09/2008    ONSET DATE: 03/21/2022 (referral)  REFERRING DIAG: D49.6 (ICD-10-CM) - Brain tumor ([place])   THERAPY DIAG:  Muscle weakness (generalized)  Other abnormalities of gait and mobility  Unsteadiness on feet  Rationale for Evaluation and Treatment Rehabilitation  SUBJECTIVE:                                                                                                                                                                                              SUBJECTIVE STATEMENT: Pt reports she has been doing balance exercises at home with supervision of daughter; no falls reported since previous PT session a week ago.  Pt states she is now using RW in home almost all the time now since she has been instructed to do so - and since she has someone (family member) often watching her - occasionally takes a few steps without the walker at times.   Pt accompanied by: self  PERTINENT HISTORY: hypertension, hypothyroidism, tobacco use, peripheral neuropathy followed by Dr. 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 Neuro biopsy 04/2014 showed moderate severe loss of myelinated fiber.   PAIN:  Are you having pain? No  PRECAUTIONS: Fall  WEIGHT BEARING RESTRICTIONS No  FALLS: Has patient fallen in last 6 months?  No falls but has had one near miss while performing bed mobility   PATIENT GOALS "Mobility I guess" "Independence is my main thing"    OBJECTIVE:   TODAY'S TREATMENT:     GAIT: Gait pattern: decreased arm swing- Right, decreased arm swing- Left, decreased step length- Right, and decreased hip/knee flexion- Left Distance walked: 51' with no device with HHA in RUE due to slight weakness in LLE:   6' with SPC with  rubber quad tip (pt states she has triangular based cane at home) with CGA and verbal cues to attend to objects on left side due to Lt inattention:  Pt gait trained inside // bars 10' x 6 reps after gait training without device to use mirror for visual feedback to facilitate increased step length with increased initial heel contact.    Assistive device utilized: RW to amb. From OT area to mat in gym & at end of session 100' from mat table to clinic lobby with SBA with cues for increased step length;  SPC with rubber quad tip used for gait training during session Level of assistance: CGA Comments: Pt takes short steps when amb. Without device with decreased initial heel strike noted; needed cues to attend to objects on Lt side during gait training in gym    Step training; pt negotiated 4 steps with use of bil. Hand rails using step over step sequence for ascension and step by step sequence with descension with CGA   THER EX:    Pt performed sit to stand from high/low mat table 5 reps without UE support but using back of legs to stabilize upon initial standing Supine exercises for Lt hip strengthening:  bridging 5 reps; bridging with LE's on red physioball 5 reps 2 sets LLE unilateral bridge 10 reps Lt hip abduction in Rt sidelying 10 reps Lt clamshell for Lt hip abductor strengthening with green theraband for resistance 10 reps  Step ups to 6" step 10 reps LLE with RUE support;  10 reps RLE with less difficulty with RUE support    PATIENT EDUCATION: Education details: Added exercises to HEP for Lt hip strengthening and balance exercises - Med[employer]  [email or web address] Person educated: Patient Education method: Explanation, Demonstration, and Handouts Education comprehension: verbalized understanding and needs further education  Access Code: [email or web address] URL: [email or web address] Date: 05/02/2022 Prepared by: Ethelene Browns  Exercises - Supine Bridge  - 1 x daily - 7 x weekly - 1 sets - 10  reps - 3-5 secs hold - Single Leg Bridge  - 1 x daily - 7 x weekly - 1 sets - 10 reps - 3 sec hold - Sidelying Hip Abduction  - 1 x daily - 7 x weekly - 1 sets - 10 reps - 2 sec hold - Clamshell  - 1 x daily - 7 x weekly - 3 sets - 10 reps - 3-5 sec hold hold - Standing Hip Flexion with Counter Support  - 1 x daily - 7 x weekly - 1 sets - 10 reps - Standing Hip Extension with Counter Support  - 1 x daily - 7 x weekly - 1 sets - 10 reps - Standing Hip Abduction with Counter Support  - 1 x daily - 7 x weekly - 3 sets - 10 reps  GOALS: Goals reviewed with patient? Yes   SHORT TERM GOALS: Target date: 05/09/2022   Pt will perform initial HEP w/S* from daughter for improved strength, balance, transfers and gait.   Baseline: not established on eval  Goal status: INITIAL   2.  Pt will improve gait velocity to at least 2.2 ft/s with LRAD for improved gait efficiency and confidence with capabilities    Baseline: 1.82 ft/s w/RW Goal status: INITIAL   3.  Pt will improve normal TUG to less than or equal to 20 seconds w/LRAD for improved functional mobility and decreased fall risk.   Baseline: 25.84s w/RW Goal status: INITIAL   4.  Berg to be assessed and LTG written Baseline:  Goal status: MET     LONG TERM GOALS: Target date: 05/23/2022   Pt will be independent with final HEP for  improved strength, balance, transfers and gait.   Baseline:  Goal status: INITIAL   2.  Pt will improve Berg score to >/= 46/56 to demonstrate decreased fall risk   Baseline: 38/56 (04/21/22) Goal status: INITIAL   3.  Pt will ambulate >500' on level and unlevel surfaces w/LRAD mod I for return to community mobility and improved visual scanning  Baseline:  Goal status: INITIAL   4.  Pt will improve 5 x STS to less than or equal to 15 seconds without UE support to demonstrate improved functional strength and transfer efficiency.    Baseline: 16.72s w/BUE support  Goal status: INITIAL   5.  Pt will  improve gait velocity to at least 2.7 ft/s w/LRAD for improved gait efficiency and performance at community ambulator level    Baseline: 1.82 ft/s w/RW Goal status: INITIAL   6. Pt and daughter will verbalize fall prevention strategies to be used in the home and community environment for reduced fall risk             Baseline:             Goal Status: INITIAL     ASSESSMENT:   CLINICAL IMPRESSION: PT session focused on gait training with less assistive device than RW and strengthening exercises for Lt hip.  Initially trialed  no device with Rt HHA and then used SPC with rubber quad tip with pt demonstrating good balance with use of SPC with need for cues to attend to objects on Lt side due to Lt inattention.  Pt has Lt hip weakness with moderate difficulty performing sit to stand without UE support from mat table.     OBJECTIVE IMPAIRMENTS decreased balance, decreased cognition, decreased coordination, decreased knowledge of condition, decreased knowledge of use of DME, difficulty walking, decreased safety awareness, impaired perceived functional ability, impaired sensation, and impaired vision/preception.    ACTIVITY LIMITATIONS lifting, bending, squatting, transfers, bed mobility, dressing, and caring for others   PARTICIPATION LIMITATIONS: meal prep, cleaning, laundry, medication management, personal finances, driving, shopping, community activity, and yard work   [place], Transportation, and 1 comorbidity: L inattention  are also affecting patient's functional outcome.    REHAB POTENTIAL: Good   CLINICAL DECISION MAKING: Stable/uncomplicated   EVALUATION COMPLEXITY: Low   PLAN: PT FREQUENCY: 2x/week   PT DURATION: 6 weeks   PLANNED INTERVENTIONS: Therapeutic exercises, Therapeutic activity, Neuromuscular re-education, Balance training, Gait training, Patient/Family education, Self Care, Joint mobilization, Visual/preceptual remediation/compensation, DME  instructions, and Re-evaluation   PLAN FOR NEXT SESSION: check exercises added to HEP on 05-01-22;  work on L inattention, progress to gait w/reduced AD support (SPC with rubber quad tip), dynamic balance with decreased UE support    Guido Sander, PT [facility] [address] [place], [place] [address] [phone] 05/02/22, 12:49 PM

## 2022-05-03 ENCOUNTER — Other Ambulatory Visit: Payer: Self-pay

## 2022-05-03 ENCOUNTER — Encounter: Payer: Self-pay | Admitting: Urology

## 2022-05-03 ENCOUNTER — Ambulatory Visit
Admission: RE | Admit: 2022-05-03 | Discharge: 2022-05-03 | Disposition: A | Discharge status: Home / Self Care | Payer: Medicare PPO | Source: Ambulatory Visit | Attending: Radiation Oncology | Admitting: Radiation Oncology

## 2022-05-03 VITALS — BP 158/80 | HR 73 | Temp 97.5°F | Resp 20

## 2022-05-03 DIAGNOSIS — C50911 Malignant neoplasm of unspecified site of right female breast: Secondary | ICD-10-CM

## 2022-05-03 DIAGNOSIS — Z510 Encounter for antineoplastic radiation therapy: Secondary | ICD-10-CM | POA: Diagnosis not present

## 2022-05-03 LAB — RAD ONC ARIA SESSION SUMMARY
Course Elapsed Days: 6
Plan Fractions Treated to Date: 3
Plan Prescribed Dose Per Fraction: 9 Gy
Plan Total Fractions Prescribed: 3
Plan Total Prescribed Dose: 27 Gy
Reference Point Dosage Given to Date: 27 Gy
Reference Point Session Dosage Given: 9 Gy
Session Number: 3

## 2022-05-03 NOTE — Progress Notes (Signed)
Nurse monitoring complete status post 3 of 3 SRS treatments. Patient without complaints. Patient denies new or worsening neurologic symptoms. Vitals stable. Instructed patient to avoid strenuous activity for the next 24 hours. Instructed patient to call [phone] with needs related to treatment after hours or over the weekend. Patient verbalized understanding. Patient ambulated out of clinic using rolling walker with her daughter without incident  Vitals:   05/03/22 1605  BP: (!) 158/80  Pulse: 73  Resp: 20  Temp: (!) 97.5 F (36.4 C)  SpO2: 99%

## 2022-05-04 ENCOUNTER — Ambulatory Visit: Payer: Medicare PPO | Admitting: Physical Therapy

## 2022-05-04 ENCOUNTER — Ambulatory Visit: Payer: Medicare PPO | Admitting: Speech Pathology

## 2022-05-04 ENCOUNTER — Ambulatory Visit: Payer: Medicare PPO | Admitting: Occupational Therapy

## 2022-05-04 ENCOUNTER — Encounter: Payer: Self-pay | Admitting: Physical Therapy

## 2022-05-04 DIAGNOSIS — R41841 Cognitive communication deficit: Secondary | ICD-10-CM

## 2022-05-04 DIAGNOSIS — R414 Neurologic neglect syndrome: Secondary | ICD-10-CM

## 2022-05-04 DIAGNOSIS — R2681 Unsteadiness on feet: Secondary | ICD-10-CM

## 2022-05-04 DIAGNOSIS — M6281 Muscle weakness (generalized): Secondary | ICD-10-CM

## 2022-05-04 DIAGNOSIS — R278 Other lack of coordination: Secondary | ICD-10-CM

## 2022-05-04 DIAGNOSIS — R2689 Other abnormalities of gait and mobility: Secondary | ICD-10-CM

## 2022-05-04 NOTE — Patient Instructions (Addendum)
   COORDINATION ACTIVITIES:   1) Flip cards over one at a time (1/2 deck w/ Rt hand, 1/2 deck w/ Lt hand)   2) Flip one card over b/t fingertips Rt hand  3) Flip high lighter over b/t fingertips Lt hand  4) Pick up pennies and place in small container Rt hand  5) Pick up larger coins or checkers and place in cup Lt hand  6) Stack checkers (stacks of 5) with Rt hand, and then (stacks of 3)  Lt hand

## 2022-05-04 NOTE — Therapy (Signed)
OUTPATIENT SPEECH LANGUAGE PATHOLOGY TREATMENT   Patient Name: Tabitha Ramirez MRN: [id number] DOB:04/02/1946, 76 y.o., female Today's Date: 05/04/2022  PCP: [place] PROVIDER: Cathlyn Parsons, PA-C    End of Session - 05/04/22 0856     Visit Number 4    Number of Visits 17    Date for SLP Re-Evaluation 06/06/22    Authorization Type [employer]    Progress Note Due on Visit 10    SLP Start Time [phone]    SLP Stop Time  0930    SLP Time Calculation (min) 41 min    Activity Tolerance Patient tolerated treatment well                Past Medical History:  Diagnosis Date   Allergy    Anemia    Arthritis    "joints" (09/29/2013)   Breast cancer ([place])    GERD (gastroesophageal reflux disease)    Heart murmur    History of blood transfusion    "related to chemo/breast cancer" (09/29/2013)   Hypertension    Migraines    Personal history of chemotherapy    Personal history of radiation therapy    Radiation 11/05/13-12/24/13   Right breast    TMJ (temporomandibular joint syndrome)    "left; just dx'd" (09/29/2013   Past Surgical History:  Procedure Laterality Date   AXILLARY LYMPH NODE BIOPSY Right 03/10/2013   Procedure: AXILLARY LYMPH NODE BIOPSY;  Surgeon: Adin Hector, MD;  Location: [facility];  Service: General;  Laterality: Right;  sentinel node with blue dye   BREAST BIOPSY     BREAST LUMPECTOMY Right 09/29/2013   needle localization w/axillary LND/notes 09/29/2013   BREAST LUMPECTOMY WITH NEEDLE LOCALIZATION AND AXILLARY LYMPH NODE DISSECTION Right 09/29/2013   Procedure: RIGHT BREAST NEEDLE LOCALIZED LUMPECTOMY AND AXILLARY LYMPH NODE DISSECTION;  Surgeon: Adin Hector, MD;  Location: [place];  Service: General;  Laterality: Right;   COLONOSCOPY N/A 04/30/2013   Procedure: COLONOSCOPY;  Surgeon: Wonda Horner, MD;  [facility];  Service: Endoscopy;  Laterality: N/A;   CRANIOTOMY Right 03/11/2022   Procedure: CRANIOTOMY TUMOR EXCISION;   Surgeon: Meade Maw, MD;  Location: [facility];  Service: Neurosurgery;  Laterality: Right;   ESOPHAGOGASTRODUODENOSCOPY N/A 04/28/2013   Procedure: ESOPHAGOGASTRODUODENOSCOPY (EGD);  Surgeon: Wonda Horner, MD;  Location: [facility];  Service: Endoscopy;  Laterality: N/A;   MASTECTOMY Right 09/29/2013   "partial"   PORT-A-CATH REMOVAL  09/29/2013   PORT-A-CATH REMOVAL Left 09/29/2013   Procedure: REMOVAL PORT-A-CATH;  Surgeon: Adin Hector, MD;  Location: [place];  Service: General;  Laterality: Left;   PORTACATH PLACEMENT N/A 03/10/2013   Procedure: INSERTION PORT-A-CATH WITH FLUOROSCOPY AND ULTRASOUND;  Surgeon: Adin Hector, MD;  Location: [place];  Service: General;  Laterality: N/A;   SURAL NERVE BX Right 05/13/2014   Procedure: SURAL NERVE BIOPSY;  Surgeon: Hosie Spangle, MD;  [facility];  Service: Neurosurgery;  Laterality: Right;  Right sural nerve biopsy   TONSILLECTOMY     TOTAL ABDOMINAL HYSTERECTOMY     With bilateral salpingo-oophorectomy   Patient Active Problem List   Diagnosis Date Noted   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, right ([place]) 04/13/2022   Brain tumor ([place]) 03/17/2022   Palliative care encounter    Brain mass 03/09/2022   Seizure ([place]) 03/09/2022   Neuropathy 06/11/2014   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   Gait abnormality 01/26/2014   Unspecified hereditary and  idiopathic peripheral neuropathy 01/26/2014   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[place]) 01/20/2014   Neuropathic pain 01/20/2014   Anxiety 01/20/2014   Unspecified deficiency anemia 01/20/2014   Hx of neutropenia 08/04/2013   Jaw pain 08/04/2013   Leukocytosis 04/27/2013   Pericardial effusion 04/27/2013   Cancer of central portion of right female breast ([place]) 02/04/2013   Need for influenza vaccination 11/29/2011   Hypertriglyceridemia 03/07/2011   Post-menopausal 03/07/2011   Anemia 12/17/2010   Heart murmur 12/17/2010   Seasonal  allergies 12/17/2010   HYPOKALEMIA 02/19/2009   Essential hypertension, benign 04/09/2008    ONSET DATE: 03-09-2022   REFERRING DIAG: D49.6 (ICD-10-CM) - Brain tumor ([place])   THERAPY DIAG:  Cognitive communication deficit  Rationale for Evaluation and Treatment Rehabilitation  SUBJECTIVE:   SUBJECTIVE STATEMENT: Reporting no further concerns, pt is managing medication and finances per self and endorsed daughter   PAIN:  Are you having pain? No SPEECH THERAPY DISCHARGE SUMMARY  Visits from Start of Care: 4  Current functional level related to goals / functional outcomes: Increased independence in medication, schedule and financial management.    Remaining deficits: Mild cognitive deficits in attention, memory, processing, executive fx   Education / Equipment: Meta-cognitive strategy, medication management strategies, attention and memory strategies, HEP   Patient agrees to discharge. Patient goals were partially met. Patient is being discharged due to being pleased with the current functional level.    OBJECTIVE:   TODAY'S TREATMENT:  05-04-22: With questioning cues, pt able to verbalize medication management routine which appears to be robust and account for fluctuations in daily routine. Pt reports adherence to medication management schedule with no missed doses over 3+ week period. Problem solved for increased independence as still getting some A from family for recall of 9am administration. SLP assisted pt with mod-A for establishing daily alarm to alert to medication admin time. Pt able to teach back memory strategy with occasional min-A. Re-administer cog PROM with pt reporting improved perception of cognitive abilities. Pt does report continued slowed thinking, however tells ST she is managing that latency by giving self grace and extra time to complete tasks. Pt and daughter feel that pt has met functional goals relating to cognitive abilities, request d/c at this time.  Verbalize understanding that will need new referral for further ST services.   04-21-22: Pt returns with completed medication chart in which all medications are listed with their purpose, dose, and # of pills needs for AM & PM. Based on medications listed in chart, completed chart is 100% accurate with no overt signs of deviations made during completion at home. Using chart, pt able to ID amount of pills which should be in AM and PM slots of pill box. Pt reports to having successfully sorted medications into pill box this past week with daughter's supervision. Target attention, functional math, and problem solving through bill pay activity. Pt able to name x8 personal bills and provide estimate for their amount. Using calculator, correctly totals bill amount IND. Calculates amount remaining in account following payment of all bills accurately IND. With questioning cues to aid in completeness, pt able to verbalize her bill paying routine.   04-18-22: Reports to taking medications out of pill box, after daughter was sorted, set out, and reminded regarding admin time, and taking them. She is ensuring she takes all pills out of the correct slot. Target medication management this session. SLP led pt through simulated reading of medication labels. Able to answer questions using medication labels  with 100% accuracy. SLP provided pt with medication list chart. Pt able to ID where information would be penciled in with 75% accuracy. Difficulties with deciding how pills would be sorted into AM/PM medication box per label instructions. SLP provides further instruction and sends medication list home with pt for completion with own medications as HEP. Pt able to complete medication scheduling worksheet with 100% accuracy, demonstrating attention to detail and working memory adequate for task, with occasional min-A.    PATIENT EDUCATION: Education details: see above Person educated: Patient and Caregiver daughter Education  method: Customer service manager Education comprehension: verbalized understanding, returned demonstration, and needs further education  GOALS: Goals reviewed with patient? Yes  SHORT TERM GOALS: Target date: 05/09/2022  Pt will teach back attention and memory compensations and strategies to aid in increased independence at home given rare min-A.  Baseline: 05-04-22 Goal status: MET  2.  Pt will design bill pay system to aid in successful and accurate completion of all bills with occasional min-A over 2 sessions.  Baseline: 04-21-22; 05-04-22 Goal status: MET  3.  Pt will accurately sort medications, using targeted strategies, into weekly pill box with no more than 3 verbal cues.  Baseline: 04-21-22 Goal status: MET  4.  Pt will teach back meta-cognitive strategies to aid in increased independence at home during execution of leisure tasks with rare min-A.  Baseline:  Goal status: deferred   5.  Pt will complete CLQT during first therapy session.   Baseline: 04-18-22  Goal Status: Met  LONG TERM GOALS: Target date: 06/06/2022  Pt will report carryover of attention and memory compensations, with benefit, to aid in recall of prospective memory tasks over 1 week period.  Baseline:  Goal status: NOT MET  2.  Pt will carryover bill pay system to accurately pay all bills with distant supervision from daughter over 1 week period.  Baseline: 05-04-22 Goal status: MET  3.  Pt and daughter will report successful increased independence from baseline of total-A for medication management, evidenced by no missed doses over 1 week period.  Baseline: 05-04-22 Goal status: MET  4.  Pt will report 2 pt improvement via Cognitive Function PROM by d/c.  Baseline: 05-04-22-- 125 Goal status: MET  ASSESSMENT:  CLINICAL IMPRESSION: Patient is a 76 y.o. F who was seen today for cognitive communication deficits s/p recitation of R parietal lobe brain tumor. Prior to event, pt with full independence,  lived alone, managed all aspects of home and schedule. Currently, daughter, son-in-law, and grandson are residing with pt to assist in rehabilitation. Overall, pt and daughter report pt is returning to baseline with improved processing and cognitive function. Some residual deviation from baseline reported in areas of attention, memory, and processing. Pt is requiring additional time to problem solve complex problems, decreased memory and retention of novel information, impaired prospective memory. However, reports managing cognitive changes through strategy use and allowing extra time. Increased independence for medication and financial management, desires return to previous status of full independence. Pt pleased with current status, agreeable to ST d/c.   OBJECTIVE IMPAIRMENTS include attention, memory, awareness, and executive functioning. These impairments are limiting patient from managing medications, managing appointments, managing finances, and household responsibilities. Factors affecting potential to achieve goals and functional outcome are medical prognosis.. Patient will benefit from skilled SLP services to address above impairments and improve overall function.  REHAB POTENTIAL: Good  PLAN: SLP FREQUENCY: 2x/week  SLP DURATION: 8 weeks  PLANNED INTERVENTIONS: Cueing hierachy, Cognitive reorganization, Internal/external  aids, Functional tasks, SLP instruction and feedback, Compensatory strategies, and Patient/family education    Su Monks, CCC-SLP 05/04/2022, 8:58 AM

## 2022-05-04 NOTE — Therapy (Signed)
OUTPATIENT PHYSICAL THERAPY NEURO TREATMENT   Patient Name: Tabitha Ramirez MRN: [id number] DOB:10-26-1945, 76 y.o., female Today's Date: 05/04/2022   PCP: [facility]  REFERRING PROVIDER: Cathlyn Parsons, PA-C    PT End of Session - 05/04/22 1107     Visit Number 4    Number of Visits 13   plus eval   Date for PT Re-Evaluation 05/30/22    Authorization Type [employer]    Progress Note Due on Visit 10    PT Start Time 1104    PT Stop Time 1142    PT Time Calculation (min) 38 min    Equipment Utilized During Treatment Gait belt    Activity Tolerance Patient tolerated treatment well    Behavior During Therapy WFL for tasks assessed/performed                Past Medical History:  Diagnosis Date   Allergy    Anemia    Arthritis    "joints" (09/29/2013)   Breast cancer ([place])    GERD (gastroesophageal reflux disease)    Heart murmur    History of blood transfusion    "related to chemo/breast cancer" (09/29/2013)   Hypertension    Migraines    Personal history of chemotherapy    Personal history of radiation therapy    Radiation 11/05/13-12/24/13   Right breast    TMJ (temporomandibular joint syndrome)    "left; just dx'd" (09/29/2013   Past Surgical History:  Procedure Laterality Date   AXILLARY LYMPH NODE BIOPSY Right 03/10/2013   Procedure: AXILLARY LYMPH NODE BIOPSY;  Surgeon: Adin Hector, MD;  Location: [facility];  Service: General;  Laterality: Right;  sentinel node with blue dye   BREAST BIOPSY     BREAST LUMPECTOMY Right 09/29/2013   needle localization w/axillary LND/notes 09/29/2013   BREAST LUMPECTOMY WITH NEEDLE LOCALIZATION AND AXILLARY LYMPH NODE DISSECTION Right 09/29/2013   Procedure: RIGHT BREAST NEEDLE LOCALIZED LUMPECTOMY AND AXILLARY LYMPH NODE DISSECTION;  Surgeon: Adin Hector, MD;  Location: [facility];  Service: General;  Laterality: Right;   COLONOSCOPY N/A 04/30/2013   Procedure: COLONOSCOPY;  Surgeon: Wonda Horner, MD;   [facility];  Service: Endoscopy;  Laterality: N/A;   CRANIOTOMY Right 03/11/2022   Procedure: CRANIOTOMY TUMOR EXCISION;  Surgeon: Meade Maw, MD;  Location: [facility];  Service: Neurosurgery;  Laterality: Right;   ESOPHAGOGASTRODUODENOSCOPY N/A 04/28/2013   Procedure: ESOPHAGOGASTRODUODENOSCOPY (EGD);  Surgeon: Wonda Horner, MD;  Location: [facility];  Service: Endoscopy;  Laterality: N/A;   MASTECTOMY Right 09/29/2013   "partial"   PORT-A-CATH REMOVAL  09/29/2013   PORT-A-CATH REMOVAL Left 09/29/2013   Procedure: REMOVAL PORT-A-CATH;  Surgeon: Adin Hector, MD;  Location: [place];  Service: General;  Laterality: Left;   PORTACATH PLACEMENT N/A 03/10/2013   Procedure: INSERTION PORT-A-CATH WITH FLUOROSCOPY AND ULTRASOUND;  Surgeon: Adin Hector, MD;  Location: [facility];  Service: General;  Laterality: N/A;   SURAL NERVE BX Right 05/13/2014   Procedure: SURAL NERVE BIOPSY;  Surgeon: Hosie Spangle, MD;  [facility];  Service: Neurosurgery;  Laterality: Right;  Right sural nerve biopsy   TONSILLECTOMY     TOTAL ABDOMINAL HYSTERECTOMY     With bilateral salpingo-oophorectomy   Patient Active Problem List   Diagnosis Date Noted   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, right ([place]) 04/13/2022   Brain tumor ([place]) 03/17/2022   Palliative care encounter    Brain mass  03/09/2022   Seizure ([place]) 03/09/2022   Neuropathy 06/11/2014   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   Gait abnormality 01/26/2014   Unspecified hereditary and idiopathic peripheral neuropathy 01/26/2014   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[place]) 01/20/2014   Neuropathic pain 01/20/2014   Anxiety 01/20/2014   Unspecified deficiency anemia 01/20/2014   Hx of neutropenia 08/04/2013   Jaw pain 08/04/2013   Leukocytosis 04/27/2013   Pericardial effusion 04/27/2013   Cancer of central portion of right female breast ([place]) 02/04/2013   Need for influenza vaccination  11/29/2011   Hypertriglyceridemia 03/07/2011   Post-menopausal 03/07/2011   Anemia 12/17/2010   Heart murmur 12/17/2010   Seasonal allergies 12/17/2010   HYPOKALEMIA 02/19/2009   Essential hypertension, benign 04/09/2008    ONSET DATE: 03/21/2022 (referral)  REFERRING DIAG: D49.6 (ICD-10-CM) - Brain tumor ([place])   THERAPY DIAG:  Muscle weakness (generalized)  Other abnormalities of gait and mobility  Unsteadiness on feet  Rationale for Evaluation and Treatment Rehabilitation  SUBJECTIVE:                                                                                                                                                                                             SUBJECTIVE STATEMENT: No pain, no changes since last visit. Had radiation treatment yesterday and is somewhat fatigued today. Pt reports she has not had a chance to work on her new HEP yet due to medical appointments but will try to work on her exercises before her next session.  Pt accompanied by: self  PERTINENT HISTORY: hypertension, hypothyroidism, tobacco use, peripheral neuropathy followed by Dr. 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 Neuro biopsy 04/2014 showed moderate severe loss of myelinated fiber.   PAIN:  Are you having pain? No  PRECAUTIONS: Fall  WEIGHT BEARING RESTRICTIONS No  FALLS: Has patient fallen in last 6 months?  No falls but has had one near miss while performing bed mobility   PATIENT GOALS "Mobility I guess" "Independence is my main thing"    OBJECTIVE:   TODAY'S TREATMENT:   THER ACT:  [facility] PT Assessment - 05/04/22 1119       Ambulation/Gait   Gait velocity 32.8" over 15.8s = 2.08 ft/sec      Timed Up and Go Test   TUG Normal TUG    Normal TUG (seconds) 25            Ambulation through obstacle course weaving through cones with RW with focus on safe RW management as pt tends to push RW out ahead of her. Also focus on  scanning to the L for obstacles due  to L inattention. Pt runs into 90% of obstacles in her L visual field despite frequent verbal cueing. With decrease in # of cones and # of distractions in environment pt exhibits improved performance only running into 50% of obstacles.   THER EX:   SciFit multi-peaks level 1 for 5 minutes using BUE/BLEs for neural priming for reciprocal movement, dynamic cardiovascular warmup and increased amplitude of stepping. RPE of 5/10 following activity.   PATIENT EDUCATION: Education details: scores on functional outcome measures and functional implications, ongoing balance deficits and L inattention Medbridge  [email or web address] Person educated: Patient Education method: Explanation, Demonstration, and Handouts Education comprehension: verbalized understanding and needs further education  Access Code: [email or web address] URL: [email or web address] Date: 05/02/2022 Prepared by: Ethelene Browns  Exercises - Supine Bridge  - 1 x daily - 7 x weekly - 1 sets - 10 reps - 3-5 secs hold - Single Leg Bridge  - 1 x daily - 7 x weekly - 1 sets - 10 reps - 3 sec hold - Sidelying Hip Abduction  - 1 x daily - 7 x weekly - 1 sets - 10 reps - 2 sec hold - Clamshell  - 1 x daily - 7 x weekly - 3 sets - 10 reps - 3-5 sec hold hold - Standing Hip Flexion with Counter Support  - 1 x daily - 7 x weekly - 1 sets - 10 reps - Standing Hip Extension with Counter Support  - 1 x daily - 7 x weekly - 1 sets - 10 reps - Standing Hip Abduction with Counter Support  - 1 x daily - 7 x weekly - 3 sets - 10 reps  GOALS: Goals reviewed with patient? Yes   SHORT TERM GOALS: Target date: 05/09/2022   Pt will perform initial HEP w/S* from daughter for improved strength, balance, transfers and gait.   Baseline: not established on eval  Goal status: INITIAL   2.  Pt will improve gait velocity to at least 2.2 ft/s with LRAD for improved gait efficiency and confidence with capabilities    Baseline:  1.82 ft/s w/RW, 2.08 ft/sec with RW (8/10) Goal status: IN PROGRESS   3.  Pt will improve normal TUG to less than or equal to 20 seconds w/LRAD for improved functional mobility and decreased fall risk.   Baseline: 25.84s w/RW, 25 sec with RW (8/10) Goal status: IN PROGRESS    4.  Berg to be assessed and LTG written Baseline: 38/56 (04/21/22) Goal status: MET     LONG TERM GOALS: Target date: 05/23/2022   Pt will be independent with final HEP for improved strength, balance, transfers and gait.   Baseline:  Goal status: INITIAL   2.  Pt will improve Berg score to >/= 46/56 to demonstrate decreased fall risk   Baseline: 38/56 (04/21/22) Goal status: INITIAL   3.  Pt will ambulate >500' on level and unlevel surfaces w/LRAD mod I for return to community mobility and improved visual scanning  Baseline:  Goal status: INITIAL   4.  Pt will improve 5 x STS to less than or equal to 15 seconds without UE support to demonstrate improved functional strength and transfer efficiency.    Baseline: 16.72s w/BUE support  Goal status: INITIAL   5.  Pt will improve gait velocity to at least 2.7 ft/s w/LRAD for improved gait efficiency and performance at community ambulator level    Baseline: 1.82 ft/s w/RW, 2.08 ft/sec with RW (8/10) Goal status: INITIAL   6. Pt and  daughter will verbalize fall prevention strategies to be used in the home and community environment for reduced fall risk             Baseline:             Goal Status: INITIAL     ASSESSMENT:   CLINICAL IMPRESSION: Emphasis of skilled PT session initiating STG assessment and working on improving L attention in functional context. Pt exhibits improved gait speed of 2.08 ft/sec from 1.82 ft/sec on initial eval and slightly improved TUG score of 25 sec with RW from 25.84 sec with RW on initial eval. Pt continues to exhibit L inattention and exhibits difficulty avoiding obstacles in L visual field and needs frequent verbal cues for  safety and obstacle avoidance. Continue POC.    OBJECTIVE IMPAIRMENTS decreased balance, decreased cognition, decreased coordination, decreased knowledge of condition, decreased knowledge of use of DME, difficulty walking, decreased safety awareness, impaired perceived functional ability, impaired sensation, and impaired vision/preception.    ACTIVITY LIMITATIONS lifting, bending, squatting, transfers, bed mobility, dressing, and caring for others   PARTICIPATION LIMITATIONS: meal prep, cleaning, laundry, medication management, personal finances, driving, shopping, community activity, and yard work   [place], Transportation, and 1 comorbidity: L inattention  are also affecting patient's functional outcome.    REHAB POTENTIAL: Good   CLINICAL DECISION MAKING: Stable/uncomplicated   EVALUATION COMPLEXITY: Low   PLAN: PT FREQUENCY: 2x/week   PT DURATION: 6 weeks   PLANNED INTERVENTIONS: Therapeutic exercises, Therapeutic activity, Neuromuscular re-education, Balance training, Gait training, Patient/Family education, Self Care, Joint mobilization, Visual/preceptual remediation/compensation, DME instructions, and Re-evaluation   PLAN FOR NEXT SESSION: check exercises added to HEP on 05-01-22;  work on L inattention, progress to gait w/reduced AD support (SPC with rubber quad tip), dynamic balance with decreased UE support       Excell Seltzer, PT, DPT, [facility] [address], [place], [place] [address] [phone] 05/02/22, 12:49 PM

## 2022-05-04 NOTE — Therapy (Signed)
OUTPATIENT OCCUPATIONAL THERAPY NEURO TREATMENT  Patient Name: Tabitha Ramirez MRN: [id number] DOB:[birth year], 76 y.o., female Today's Date: 05/04/2022  PCP: [facility] primary care REFERRING PROVIDER: Dr. Naaman Plummer   OT End of Session - 05/04/22 0934     Visit Number 3    Number of Visits 25    Date for OT Re-Evaluation 07/04/22    Authorization Type [employer]    OT Start Time 0930    OT Stop Time 1015    OT Time Calculation (min) 45 min    Activity Tolerance Patient tolerated treatment well    Behavior During Therapy [facility] for tasks assessed/performed;Impulsive             Past Medical History:  Diagnosis Date   Allergy    Anemia    Arthritis    "joints" (09/29/2013)   Breast cancer ([place])    GERD (gastroesophageal reflux disease)    Heart murmur    History of blood transfusion    "related to chemo/breast cancer" (09/29/2013)   Hypertension    Migraines    Personal history of chemotherapy    Personal history of radiation therapy    Radiation 11/05/13-12/24/13   Right breast    TMJ (temporomandibular joint syndrome)    "left; just dx'd" (09/29/2013   Past Surgical History:  Procedure Laterality Date   AXILLARY LYMPH NODE BIOPSY Right 03/10/2013   Procedure: AXILLARY LYMPH NODE BIOPSY;  Surgeon: Adin Hector, MD;  Location: [place];  Service: General;  Laterality: Right;  sentinel node with blue dye   BREAST BIOPSY     BREAST LUMPECTOMY Right 09/29/2013   needle localization w/axillary LND/notes 09/29/2013   BREAST LUMPECTOMY WITH NEEDLE LOCALIZATION AND AXILLARY LYMPH NODE DISSECTION Right 09/29/2013   Procedure: RIGHT BREAST NEEDLE LOCALIZED LUMPECTOMY AND AXILLARY LYMPH NODE DISSECTION;  Surgeon: Adin Hector, MD;  Location: [place];  Service: General;  Laterality: Right;   COLONOSCOPY N/A 04/30/2013   Procedure: COLONOSCOPY;  Surgeon: Wonda Horner, MD;  [facility];  Service: Endoscopy;  Laterality: N/A;   CRANIOTOMY Right 03/11/2022   Procedure: CRANIOTOMY TUMOR EXCISION;   Surgeon: Meade Maw, MD;  Location: [facility];  Service: Neurosurgery;  Laterality: Right;   ESOPHAGOGASTRODUODENOSCOPY N/A 04/28/2013   Procedure: ESOPHAGOGASTRODUODENOSCOPY (EGD);  Surgeon: Wonda Horner, MD;  Location[facility];  Service: Endoscopy;  Laterality: N/A;   MASTECTOMY Right 09/29/2013   "partial"   PORT-A-CATH REMOVAL  09/29/2013   PORT-A-CATH REMOVAL Left 09/29/2013   Procedure: REMOVAL PORT-A-CATH;  Surgeon: Adin Hector, MD;  Location: [place];  Service: General;  Laterality: Left;   PORTACATH PLACEMENT N/A 03/10/2013   Procedure: INSERTION PORT-A-CATH WITH FLUOROSCOPY AND ULTRASOUND;  Surgeon: Adin Hector, MD;  Location: [place];  Service: General;  Laterality: N/A;   SURAL NERVE BX Right 05/13/2014   Procedure: SURAL NERVE BIOPSY;  Surgeon: Hosie Spangle, MD;  [facility];  Service: Neurosurgery;  Laterality: Right;  Right sural nerve biopsy   TONSILLECTOMY     TOTAL ABDOMINAL HYSTERECTOMY     With bilateral salpingo-oophorectomy   Patient Active Problem List   Diagnosis Date Noted   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, right ([place]) 04/13/2022   Brain tumor ([place]) 03/17/2022   Palliative care encounter    Brain mass 03/09/2022   Seizure ([place]) 03/09/2022   Neuropathy 06/11/2014   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   Gait abnormality 01/26/2014   Unspecified hereditary and idiopathic peripheral neuropathy 01/26/2014  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[place]) 01/20/2014   Neuropathic pain 01/20/2014   Anxiety 01/20/2014   Unspecified deficiency anemia 01/20/2014   Hx of neutropenia 08/04/2013   Jaw pain 08/04/2013   Leukocytosis 04/27/2013   Pericardial effusion 04/27/2013   Cancer of central portion of right female breast ([place]) 02/04/2013   Need for influenza vaccination 11/29/2011   Hypertriglyceridemia 03/07/2011   Post-menopausal 03/07/2011   Anemia 12/17/2010   Heart murmur 12/17/2010   Seasonal  allergies 12/17/2010   HYPOKALEMIA 02/19/2009   Essential hypertension, benign 04/09/2008    ONSET DATE: 03/11/22, craniotomy  REFERRING DIAG: D49.6 (ICD-10-CM) - Brain tumor ([place])  THERAPY DIAG:  Other lack of coordination  Neurologic neglect syndrome  Rationale for Evaluation and Treatment Rehabilitation  SUBJECTIVE:   SUBJECTIVE STATEMENT: I had radiation yesterday, so I'm slower today Pt accompanied by: family member  PERTINENT HISTORY:  PMH significant for breast CA in remission for 7 years,  status post lobectomy, chemotherapy, radiation, hypertension, hypothyroidism, severe peripheral neuropathy status post IVIG, hx of seizure. 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 Pt was seen on CIR and d/c home 03/27/22  PRECAUTIONS: Fall  WEIGHT BEARING RESTRICTIONS No  PAIN:  Are you having pain? No   PATIENT GOALS to be more independent  OBJECTIVE:   HAND DOMINANCE: Right  HAND FUNCTION: Grip strength: Right: 36.5 lbs; Left: 41.0 lbs  COORDINATION: 9 Hole Peg test: Right: 1 min 43 secs  Left: 7pegs in 2 min   Box and Blocks:  Right 37 blocks, Left  20 blocks   VISION ASSESSMENT: Visual Fields: per chart left visual field deficit,  pt's dtr reports L neglect  PERCEPTION: Impaired: Inattention/neglect: does not attend to left side of body   TODAY'S TREATMENT:   Issued simple coordination HEP - See pt instructions for details. Pt w/ noted apraxia at beginning of session trying to take purse off Lt arm and during coordination ex's. Modified coordination HEP significantly  Practiced copying peg design (medium sized pegs) for coordination, visual scanning, and perceptual skills. Pt w/ max difficulty and drops Lt hand, mod difficulty Rt hand. Pt unable to finish task d/t time constraints and extra time required (especially Lt hand)     PATIENT EDUCATION: Education details: coordination HEP  Person educated: Patient Education method: Explanation, Demonstration,  Verbal cues, and Handouts Education comprehension: verbalized understanding, returned demonstration, verbal cues required, and needs further education    HOME EXERCISE PROGRAM: 05/02/22: Visual scanning strategies 05/04/22: Coordination HEP  GOALS:   SHORT TERM GOALS: Target date:    I with initial HEP  Goal status: IN PROGRESS  2.  Pt will perform dressing with supervision only.  Goal status: INITIAL  3.  Pt will verbalize understanding of compensatory strategies for visual deficits/ left neglect and sensory deficits.  Goal status: IN PROGRESS  4.  Pt will demonstrate improved bilateral UE functional use as evidenced by increasing box/ blocks score by 5 blocks bilaterally. Baseline: Box and Blocks:  Right 37 blocks, Left  20 blocks Goal status: INITIAL  5.  Pt will perform functional tabletop scanning activities with 90% or better accuracy  Goal status: IN PROGRESS  6.  Improve grip strength Rt hand by 5 lbs Baseline: Rt = 36.5 lbs, Lt = 41.0 LBS Goal status: INITIAL  LONG TERM GOALS: Target date: 07/04/22  I with updated HEP.  Goal status: INITIAL  2.  Pt will perform all basic ADLs mod I   Goal status: INITIAL  3.  Pt will perform basic home management with supervision demonstrating good safety awareness.  Goal status: INITIAL  4.  Pt will perform environmental scanning in a mod distracting environment with 90% accuracy and no LOB.  Goal status: INITIAL  5.  Pt will perform basic cooking task with min A demonstrating good safety awareness  Goal status: INITIAL  6.  Pt will demonstrate improved LUE fine motor coordination as evidenced by placing 9 pegs in 2 mins. Baseline: 7 pegs in 2 mins Goal status: INITIAL  7. Pt  will demonstrate improved fine motor coordination as evidenced by decreasing 9 hole peg test score to 90 secs or less RUE  Baseline: 1 min 43 secs initial   Goal status: initial  ASSESSMENT:  CLINICAL IMPRESSION: Pt w/ noted apraxia  and bilaterally decreased hand coordination (Lt worse than Rt). Pt w/ decreased attention to Lt side of body  PERFORMANCE DEFICITS in functional skills including ADLs, IADLs, coordination, dexterity, proprioception, sensation, strength, FMC, GMC, mobility, balance, endurance, decreased knowledge of precautions, decreased knowledge of use of DME, vision, and UE functional use, cognitive skills including attention, learn, memory, perception, problem solving, safety awareness, sequencing, thought, and understand, and psychosocial skills including coping strategies, environmental adaptation, habits, interpersonal interactions, and routines and behaviors.   IMPAIRMENTS are limiting patient from ADLs, IADLs, play, leisure, and social participation.   COMORBIDITIES may have co-morbidities  that affects occupational performance. Patient will benefit from skilled OT to address above impairments and improve overall function.  MODIFICATION OR ASSISTANCE TO COMPLETE EVALUATION: No modification of tasks or assist necessary to complete an evaluation.  OT OCCUPATIONAL PROFILE AND HISTORY: Detailed assessment: Review of records and additional review of physical, cognitive, psychosocial history related to current functional performance.  CLINICAL DECISION MAKING: LOW - limited treatment options, no task modification necessary  REHAB POTENTIAL: Good  EVALUATION COMPLEXITY: Low    PLAN: OT FREQUENCY: 2x/week  OT DURATION: 12 weeks plus eval  PLANNED INTERVENTIONS: self care/ADL training, therapeutic exercise, therapeutic activity, neuromuscular re-education, manual therapy, passive range of motion, gait training, balance training, functional mobility training, aquatic therapy, fluidotherapy, moist heat, cryotherapy, patient/family education, cognitive remediation/compensation, visual/perceptual remediation/compensation, energy conservation, coping strategies training, DME and/or AE instructions, and  Re-evaluation  RECOMMENDED OTHER SERVICES: PT, ST  CONSULTED AND AGREED WITH PLAN OF CARE: Patient and family member/caregiver  PLAN FOR NEXT SESSION: review coordination HEP, continue bilateral coordination and visual scanning (near body and environmental), pt needs larger pegs for Lt hand   Hans Eden, OT 05/04/2022, 9:35 AM

## 2022-05-09 ENCOUNTER — Ambulatory Visit: Payer: Medicare PPO | Admitting: Occupational Therapy

## 2022-05-09 ENCOUNTER — Ambulatory Visit: Payer: Medicare PPO | Admitting: Physical Therapy

## 2022-05-09 ENCOUNTER — Encounter: Payer: Self-pay | Admitting: Physical Therapy

## 2022-05-09 DIAGNOSIS — R2681 Unsteadiness on feet: Secondary | ICD-10-CM

## 2022-05-09 DIAGNOSIS — R2689 Other abnormalities of gait and mobility: Secondary | ICD-10-CM

## 2022-05-09 DIAGNOSIS — M6281 Muscle weakness (generalized): Secondary | ICD-10-CM | POA: Diagnosis not present

## 2022-05-09 DIAGNOSIS — R414 Neurologic neglect syndrome: Secondary | ICD-10-CM

## 2022-05-09 DIAGNOSIS — R278 Other lack of coordination: Secondary | ICD-10-CM

## 2022-05-09 DIAGNOSIS — R208 Other disturbances of skin sensation: Secondary | ICD-10-CM

## 2022-05-09 NOTE — Therapy (Signed)
OUTPATIENT PHYSICAL THERAPY NEURO TREATMENT   Patient Name: Tabitha Ramirez MRN: [id number] DOB:18-Aug-1946, 76 y.o., female Today's Date: 05/09/2022   PCP: [facility]  REFERRING PROVIDER: Cathlyn Parsons, PA-C    PT End of Session - 05/09/22 0930     Visit Number 5    Number of Visits 13   plus eval   Date for PT Re-Evaluation 05/30/22    Authorization Type [employer]    Progress Note Due on Visit 10    PT Start Time 0930    PT Stop Time 1012    PT Time Calculation (min) 42 min    Equipment Utilized During Treatment --    Activity Tolerance Patient tolerated treatment well    Behavior During Therapy WFL for tasks assessed/performed                 Past Medical History:  Diagnosis Date   Allergy    Anemia    Arthritis    "joints" (09/29/2013)   Breast cancer ([place])    GERD (gastroesophageal reflux disease)    Heart murmur    History of blood transfusion    "related to chemo/breast cancer" (09/29/2013)   Hypertension    Migraines    Personal history of chemotherapy    Personal history of radiation therapy    Radiation 11/05/13-12/24/13   Right breast    TMJ (temporomandibular joint syndrome)    "left; just dx'd" (09/29/2013   Past Surgical History:  Procedure Laterality Date   AXILLARY LYMPH NODE BIOPSY Right 03/10/2013   Procedure: AXILLARY LYMPH NODE BIOPSY;  Surgeon: Adin Hector, MD;  Location: [place];  Service: General;  Laterality: Right;  sentinel node with blue dye   BREAST BIOPSY     BREAST LUMPECTOMY Right 09/29/2013   needle localization w/axillary LND/notes 09/29/2013   BREAST LUMPECTOMY WITH NEEDLE LOCALIZATION AND AXILLARY LYMPH NODE DISSECTION Right 09/29/2013   Procedure: RIGHT BREAST NEEDLE LOCALIZED LUMPECTOMY AND AXILLARY LYMPH NODE DISSECTION;  Surgeon: Adin Hector, MD;  Location: [place];  Service: General;  Laterality: Right;   COLONOSCOPY N/A 04/30/2013   Procedure: COLONOSCOPY;  Surgeon: Wonda Horner, MD;   [facility];  Service: Endoscopy;  Laterality: N/A;   CRANIOTOMY Right 03/11/2022   Procedure: CRANIOTOMY TUMOR EXCISION;  Surgeon: Meade Maw, MD;  Location: [facility];  Service: Neurosurgery;  Laterality: Right;   ESOPHAGOGASTRODUODENOSCOPY N/A 04/28/2013   Procedure: ESOPHAGOGASTRODUODENOSCOPY (EGD);  Surgeon: Wonda Horner, MD;  [facility];  Service: Endoscopy;  Laterality: N/A;   MASTECTOMY Right 09/29/2013   "partial"   PORT-A-CATH REMOVAL  09/29/2013   PORT-A-CATH REMOVAL Left 09/29/2013   Procedure: REMOVAL PORT-A-CATH;  Surgeon: Adin Hector, MD;  Location: [place];  Service: General;  Laterality: Left;   PORTACATH PLACEMENT N/A 03/10/2013   Procedure: INSERTION PORT-A-CATH WITH FLUOROSCOPY AND ULTRASOUND;  Surgeon: Adin Hector, MD;  Location: [place];  Service: General;  Laterality: N/A;   SURAL NERVE BX Right 05/13/2014   Procedure: SURAL NERVE BIOPSY;  Surgeon: Hosie Spangle, MD;  [facility];  Service: Neurosurgery;  Laterality: Right;  Right sural nerve biopsy   TONSILLECTOMY     TOTAL ABDOMINAL HYSTERECTOMY     With bilateral salpingo-oophorectomy   Patient Active Problem List   Diagnosis Date Noted   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, right ([place]) 04/13/2022   Brain tumor ([place]) 03/17/2022   Palliative care encounter    Brain mass  03/09/2022   Seizure ([place]) 03/09/2022   Neuropathy 06/11/2014   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   Gait abnormality 01/26/2014   Unspecified hereditary and idiopathic peripheral neuropathy 01/26/2014   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[place]) 01/20/2014   Neuropathic pain 01/20/2014   Anxiety 01/20/2014   Unspecified deficiency anemia 01/20/2014   Hx of neutropenia 08/04/2013   Jaw pain 08/04/2013   Leukocytosis 04/27/2013   Pericardial effusion 04/27/2013   Cancer of central portion of right female breast ([place]) 02/04/2013   Need for influenza vaccination  11/29/2011   Hypertriglyceridemia 03/07/2011   Post-menopausal 03/07/2011   Anemia 12/17/2010   Heart murmur 12/17/2010   Seasonal allergies 12/17/2010   HYPOKALEMIA 02/19/2009   Essential hypertension, benign 04/09/2008    ONSET DATE: 03/21/2022 (referral)  REFERRING DIAG: D49.6 (ICD-10-CM) - Brain tumor ([place])   THERAPY DIAG:  Muscle weakness (generalized)  Other abnormalities of gait and mobility  Unsteadiness on feet  Rationale for Evaluation and Treatment Rehabilitation  SUBJECTIVE:                                                                                                                                                                                             SUBJECTIVE STATEMENT: Pt reports things are going well at home, has some pain from arthritis and neuropathy. Reports her HEP is going well.  Pt accompanied by: self  PERTINENT HISTORY: hypertension, hypothyroidism, tobacco use, peripheral neuropathy followed by Dr. 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 Neuro biopsy 04/2014 showed moderate severe loss of myelinated fiber.   PAIN:  Are you having pain? No  PRECAUTIONS: Fall  WEIGHT BEARING RESTRICTIONS No  FALLS: Has patient fallen in last 6 months?  No falls but has had one near miss while performing bed mobility   PATIENT GOALS "Mobility I guess" "Independence is my main thing"    OBJECTIVE:   TODAY'S TREATMENT:   THER ACT:  [facility] PT Assessment - 05/09/22 0001       Ambulation/Gait   Gait velocity 32.8" over 15.28 sec = 2.15 ft/[place]      Standardized Balance Assessment   Standardized Balance Assessment Timed Up and Go Test      Timed Up and Go Test   TUG Normal TUG    Normal TUG (seconds) 23             In // bars: -4" hurdles forwards x 3 reps up/down length of bars, initially hits hurdles 75% of the time with LLE but progresses to only hitting them 25% of the time -added 3# ankle weight to LLE  for increased proprioceptive  input as well as increased challenge, hits hurdles 75% of the time during 1st round of 3 reps -2nd round of hurdles with 3# weight hits hurdles 50% of the time with increase in errors with onset of fatigue but overall decrease in # of errors from first round  -standing alt reach to R and L for targets with focus on turning and scanning to the L -standing LLE 4" beam step-overs with RUE support and min A for balance with focus on increasing LLE clearance and hip/knee flexion when stepping, x 10 reps   PATIENT EDUCATION: Education details: scores on functional outcome measures and functional implications, ongoing balance deficits and L inattention Medbridge  [email or web address] Person educated: Patient Education method: Explanation, Demonstration, and Handouts Education comprehension: verbalized understanding and needs further education  Access Code: [email or web address] URL: [email or web address] Date: 05/02/2022 Prepared by: Ethelene Browns  Exercises - Supine Bridge  - 1 x daily - 7 x weekly - 1 sets - 10 reps - 3-5 secs hold - Single Leg Bridge  - 1 x daily - 7 x weekly - 1 sets - 10 reps - 3 sec hold - Sidelying Hip Abduction  - 1 x daily - 7 x weekly - 1 sets - 10 reps - 2 sec hold - Clamshell  - 1 x daily - 7 x weekly - 3 sets - 10 reps - 3-5 sec hold hold - Standing Hip Flexion with Counter Support  - 1 x daily - 7 x weekly - 1 sets - 10 reps - Standing Hip Extension with Counter Support  - 1 x daily - 7 x weekly - 1 sets - 10 reps - Standing Hip Abduction with Counter Support  - 1 x daily - 7 x weekly - 3 sets - 10 reps  GOALS: Goals reviewed with patient? Yes   SHORT TERM GOALS: Target date: 05/09/2022   Pt will perform initial HEP w/S* from daughter for improved strength, balance, transfers and gait.  Baseline: not established on eval  Goal status: MET   2.  Pt will improve gait velocity to at least 2.2 ft/s with LRAD for improved gait efficiency and  confidence with capabilities    Baseline: 1.82 ft/s w/RW, 2.08 ft/sec with RW (8/10), 2.15 ft/sec (8/15) Goal status: IN PROGRESS   3.  Pt will improve normal TUG to less than or equal to 20 seconds w/LRAD for improved functional mobility and decreased fall risk.   Baseline: 25.84s w/RW, 25 sec with RW (8/10), 23 sec with RW (8/15) Goal status: IN PROGRESS    4.  Berg to be assessed and LTG written Baseline: 38/56 (04/21/22) Goal status: MET     LONG TERM GOALS: Target date: 05/23/2022   Pt will be independent with final HEP for improved strength, balance, transfers and gait.   Baseline:  Goal status: INITIAL   2.  Pt will improve Berg score to >/= 46/56 to demonstrate decreased fall risk   Baseline: 38/56 (04/21/22) Goal status: INITIAL   3.  Pt will ambulate >500' on level and unlevel surfaces w/LRAD mod I for return to community mobility and improved visual scanning  Baseline:  Goal status: INITIAL   4.  Pt will improve 5 x STS to less than or equal to 15 seconds without UE support to demonstrate improved functional strength and transfer efficiency.    Baseline: 16.72s w/BUE support  Goal status: INITIAL   5.  Pt will improve gait velocity to at least 2.7 ft/s w/LRAD for improved  gait efficiency and performance at community ambulator level    Baseline: 1.82 ft/s w/RW, 2.08 ft/sec with RW (8/10) Goal status: INITIAL   6. Pt and daughter will verbalize fall prevention strategies to be used in the home and community environment for reduced fall risk             Baseline:             Goal Status: INITIAL     ASSESSMENT:   CLINICAL IMPRESSION: Emphasis of skilled PT session on assessing STG, working on improving L attention, and working on LLE NMR and limb clearance and control during gait. Pt has met 2/4 goals but is making good progress towards all goals. She consistently shows improved gait speed from 1.82 ft/sec on initial eval to 2.15 ft/sec currently with RW, shows  decreased fall risk with improved TUG score from 25.84 sec to 23 sec with RW, and is able to perform her HEP with Supervision from family. Pt also scored a 38/56 on the BBS upon last assessment, indicating a significant fall risk. Pt continues to benefit from skilled therapy services to continue to work towards improved safety and independence with functional mobility. Continue POC.    OBJECTIVE IMPAIRMENTS decreased balance, decreased cognition, decreased coordination, decreased knowledge of condition, decreased knowledge of use of DME, difficulty walking, decreased safety awareness, impaired perceived functional ability, impaired sensation, and impaired vision/preception.    ACTIVITY LIMITATIONS lifting, bending, squatting, transfers, bed mobility, dressing, and caring for others   PARTICIPATION LIMITATIONS: meal prep, cleaning, laundry, medication management, personal finances, driving, shopping, community activity, and yard work   [place], Transportation, and 1 comorbidity: L inattention  are also affecting patient's functional outcome.    REHAB POTENTIAL: Good   CLINICAL DECISION MAKING: Stable/uncomplicated   EVALUATION COMPLEXITY: Low   PLAN: PT FREQUENCY: 2x/week   PT DURATION: 6 weeks   PLANNED INTERVENTIONS: Therapeutic exercises, Therapeutic activity, Neuromuscular re-education, Balance training, Gait training, Patient/Family education, Self Care, Joint mobilization, Visual/preceptual remediation/compensation, DME instructions, and Re-evaluation   PLAN FOR NEXT SESSION: work on L inattention, progress to gait w/reduced AD support (SPC with rubber quad tip), dynamic balance with decreased UE support, LLE step overs and hurdles, resisted step-taps         Excell Seltzer, PT, DPT, [facility] [address] [place], [place] [address] [phone] 05/02/22, 12:49 PM

## 2022-05-09 NOTE — Therapy (Signed)
OUTPATIENT OCCUPATIONAL THERAPY NEURO TREATMENT  Patient Name: Tabitha Ramirez MRN: [id number] DOB:[birth year], 76 y.o., female Today's Date: 05/09/2022  PCP: Rob Hickman primary care REFERRING PROVIDER: Dr. Naaman Plummer     Past Medical History:  Diagnosis Date   Allergy    Anemia    Arthritis    "joints" (09/29/2013)   Breast cancer ([place])    GERD (gastroesophageal reflux disease)    Heart murmur    History of blood transfusion    "related to chemo/breast cancer" (09/29/2013)   Hypertension    Migraines    Personal history of chemotherapy    Personal history of radiation therapy    Radiation 11/05/13-12/24/13   Right breast    TMJ (temporomandibular joint syndrome)    "left; just dx'd" (09/29/2013   Past Surgical History:  Procedure Laterality Date   AXILLARY LYMPH NODE BIOPSY Right 03/10/2013   Procedure: AXILLARY LYMPH NODE BIOPSY;  Surgeon: Adin Hector, MD;  Location: [place];  Service: General;  Laterality: Right;  sentinel node with blue dye   BREAST BIOPSY     BREAST LUMPECTOMY Right 09/29/2013   needle localization w/axillary LND/notes 09/29/2013   BREAST LUMPECTOMY WITH NEEDLE LOCALIZATION AND AXILLARY LYMPH NODE DISSECTION Right 09/29/2013   Procedure: RIGHT BREAST NEEDLE LOCALIZED LUMPECTOMY AND AXILLARY LYMPH NODE DISSECTION;  Surgeon: Adin Hector, MD;  Location: [place];  Service: General;  Laterality: Right;   COLONOSCOPY N/A 04/30/2013   Procedure: COLONOSCOPY;  Surgeon: Wonda Horner, MD;  Location: [facility];  Service: Endoscopy;  Laterality: N/A;   CRANIOTOMY Right 03/11/2022   Procedure: CRANIOTOMY TUMOR EXCISION;  Surgeon: Meade Maw, MD;  Location: [facility];  Service: Neurosurgery;  Laterality: Right;   ESOPHAGOGASTRODUODENOSCOPY N/A 04/28/2013   Procedure: ESOPHAGOGASTRODUODENOSCOPY (EGD);  Surgeon: Wonda Horner, MD;  Location: [facility];  Service: Endoscopy;  Laterality: N/A;   MASTECTOMY Right 09/29/2013   "partial"   PORT-A-CATH REMOVAL  09/29/2013    PORT-A-CATH REMOVAL Left 09/29/2013   Procedure: REMOVAL PORT-A-CATH;  Surgeon: Adin Hector, MD;  Location: [place];  Service: General;  Laterality: Left;   PORTACATH PLACEMENT N/A 03/10/2013   Procedure: INSERTION PORT-A-CATH WITH FLUOROSCOPY AND ULTRASOUND;  Surgeon: Adin Hector, MD;  Location: [place];  Service: General;  Laterality: N/A;   SURAL NERVE BX Right 05/13/2014   Procedure: SURAL NERVE BIOPSY;  Surgeon: Hosie Spangle, MD;  Location: [facility];  Service: Neurosurgery;  Laterality: Right;  Right sural nerve biopsy   TONSILLECTOMY     TOTAL ABDOMINAL HYSTERECTOMY     With bilateral salpingo-oophorectomy   Patient Active Problem List   Diagnosis Date Noted   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, right ([place]) 04/13/2022   Brain tumor ([place]) 03/17/2022   Palliative care encounter    Brain mass 03/09/2022   Seizure ([place]) 03/09/2022   Neuropathy 06/11/2014   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   Gait abnormality 01/26/2014   Unspecified hereditary and idiopathic peripheral neuropathy 01/26/2014   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[place]) 01/20/2014   Neuropathic pain 01/20/2014   Anxiety 01/20/2014   Unspecified deficiency anemia 01/20/2014   Hx of neutropenia 08/04/2013   Jaw pain 08/04/2013   Leukocytosis 04/27/2013   Pericardial effusion 04/27/2013   Cancer of central portion of right female breast ([place]) 02/04/2013   Need for influenza vaccination 11/29/2011   Hypertriglyceridemia 03/07/2011   Post-menopausal 03/07/2011   Anemia 12/17/2010   Heart murmur 12/17/2010   Seasonal allergies 12/17/2010   HYPOKALEMIA 02/19/2009  Essential hypertension, benign 04/09/2008    ONSET DATE: 03/11/22, craniotomy  REFERRING DIAG: D49.6 (ICD-10-CM) - Brain tumor ([place])  THERAPY DIAG:  No diagnosis found.  Rationale for Evaluation and Treatment Rehabilitation  SUBJECTIVE:   SUBJECTIVE STATEMENT: I had radiation yesterday, so I'm  slower today Pt accompanied by: family member  PERTINENT HISTORY:  PMH significant for breast CA in remission for 7 years,  status post lobectomy, chemotherapy, radiation, hypertension, hypothyroidism, severe peripheral neuropathy status post IVIG, hx of seizure. 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 Pt was seen on CIR and d/c home 03/27/22  PRECAUTIONS: Fall  WEIGHT BEARING RESTRICTIONS No  PAIN:  Are you having pain? No   PATIENT GOALS to be more independent  OBJECTIVE:   HAND DOMINANCE: Right  HAND FUNCTION: Grip strength: Right: 36.5 lbs; Left: 41.0 lbs  COORDINATION: 9 Hole Peg test: Right: 1 min 43 secs  Left: 7pegs in 2 min   Box and Blocks:  Right 37 blocks, Left  20 blocks   VISION ASSESSMENT: Visual Fields: per chart left visual field deficit,  pt's dtr reports L neglect  PERCEPTION: Impaired: Inattention/neglect: does not attend to left side of body   TODAY'S TREATMENT:   Reviewed simple coordination HEP issued last visit  -. 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 Placing large pegs into pegboard with LUE , min difficulty and then medium and small with RUE for increased fine motor coordination, with min difficulty, removing large and medium with LUE,min difficulty/ drops and increased time , removing small pegs with RUE min difficulty/ v.c Reviewed walker safety with sit/ stand and stand to sit PATIENT EDUCATION: Education details: coordination HEP review Person educated: Patient Education method: Consulting civil engineer, Demonstration, Verbal cues,  Education comprehension: returned demonstration, mod v.c    HOME EXERCISE PROGRAM: 05/02/22: Visual scanning strategies 05/04/22: Coordination HEP  GOALS:   SHORT TERM GOALS: Target date:    I with initial HEP  Goal status: IN PROGRESS  2.  Pt will perform dressing with supervision only.  Goal status: INITIAL  3.  Pt will verbalize understanding of compensatory strategies  for visual deficits/ left neglect and sensory deficits.  Goal status: IN PROGRESS  4.  Pt will demonstrate improved bilateral UE functional use as evidenced by increasing box/ blocks score by 5 blocks bilaterally. Baseline: Box and Blocks:  Right 37 blocks, Left  20 blocks Goal status: INITIAL  5.  Pt will perform functional tabletop scanning activities with 90% or better accuracy  Goal status: IN PROGRESS  6.  Improve grip strength Rt hand by 5 lbs Baseline: Rt = 36.5 lbs, Lt = 41.0 LBS Goal status: INITIAL  LONG TERM GOALS: Target date: 07/04/22  I with updated HEP.  Goal status: INITIAL  2.  Pt will perform all basic ADLs mod I   Goal status: INITIAL  3.  Pt will perform basic home management with supervision demonstrating good safety awareness.  Goal status: INITIAL  4.  Pt will perform environmental scanning in a mod distracting environment with 90% accuracy and no LOB.  Goal status: INITIAL  5.  Pt will perform basic cooking task with min A demonstrating good safety awareness  Goal status: INITIAL  6.  Pt will demonstrate improved LUE fine motor coordination as evidenced by placing 9 pegs in 2 mins. Baseline: 7 pegs in 2 mins Goal status: INITIAL  7. Pt  will demonstrate improved fine motor coordination as evidenced by decreasing 9 hole  peg test score to 90 secs or less RUE  Baseline: 1 min 43 secs initial   Goal status: initial  ASSESSMENT:  CLINICAL IMPRESSION: Pt is progressing towards goals. 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  PERFORMANCE DEFICITS in functional skills including ADLs, IADLs, coordination, dexterity, proprioception, sensation, strength, FMC, GMC, mobility, balance, endurance, decreased knowledge of precautions, decreased knowledge of use of DME, vision, and UE functional use, cognitive skills including attention, learn, memory, perception, problem solving, safety awareness, sequencing, thought,  and understand, and psychosocial skills including coping strategies, environmental adaptation, habits, interpersonal interactions, and routines and behaviors.   IMPAIRMENTS are limiting patient from ADLs, IADLs, play, leisure, and social participation.   COMORBIDITIES may have co-morbidities  that affects occupational performance. Patient will benefit from skilled OT to address above impairments and improve overall function.  MODIFICATION OR ASSISTANCE TO COMPLETE EVALUATION: No modification of tasks or assist necessary to complete an evaluation.  OT OCCUPATIONAL PROFILE AND HISTORY: Detailed assessment: Review of records and additional review of physical, cognitive, psychosocial history related to current functional performance.  CLINICAL DECISION MAKING: LOW - limited treatment options, no task modification necessary  REHAB POTENTIAL: Good  EVALUATION COMPLEXITY: Low    PLAN: OT FREQUENCY: 2x/week  OT DURATION: 12 weeks plus eval  PLANNED INTERVENTIONS: self care/ADL training, therapeutic exercise, therapeutic activity, neuromuscular re-education, manual therapy, passive range of motion, gait training, balance training, functional mobility training, aquatic therapy, fluidotherapy, moist heat, cryotherapy, patient/family education, cognitive remediation/compensation, visual/perceptual remediation/compensation, energy conservation, coping strategies training, DME and/or AE instructions, and Re-evaluation  RECOMMENDED OTHER SERVICES: PT, ST  CONSULTED AND AGREED WITH PLAN OF CARE: Patient and family member/caregiver  PLAN FOR NEXT SESSION:  continue bilateral coordination left more impaired than right and visual scanning (near body and environmental),   Adler Chartrand, OT 05/09/2022, 7:23 AM

## 2022-05-11 ENCOUNTER — Ambulatory Visit: Payer: Medicare PPO

## 2022-05-11 ENCOUNTER — Ambulatory Visit: Payer: Medicare PPO | Admitting: Occupational Therapy

## 2022-05-16 ENCOUNTER — Ambulatory Visit: Payer: Medicare PPO | Admitting: Occupational Therapy

## 2022-05-16 ENCOUNTER — Encounter: Payer: Medicare PPO | Admitting: Speech Pathology

## 2022-05-16 ENCOUNTER — Ambulatory Visit: Payer: Medicare PPO | Admitting: Physical Therapy

## 2022-05-16 DIAGNOSIS — M6281 Muscle weakness (generalized): Secondary | ICD-10-CM

## 2022-05-16 DIAGNOSIS — R2689 Other abnormalities of gait and mobility: Secondary | ICD-10-CM

## 2022-05-16 DIAGNOSIS — R278 Other lack of coordination: Secondary | ICD-10-CM

## 2022-05-16 DIAGNOSIS — R2681 Unsteadiness on feet: Secondary | ICD-10-CM

## 2022-05-16 DIAGNOSIS — R208 Other disturbances of skin sensation: Secondary | ICD-10-CM

## 2022-05-16 DIAGNOSIS — R414 Neurologic neglect syndrome: Secondary | ICD-10-CM

## 2022-05-16 NOTE — Therapy (Signed)
OUTPATIENT OCCUPATIONAL THERAPY NEURO TREATMENT  Patient Name: Tabitha Ramirez MRN: [id number] DOB:April 08, 1946, 76 y.o., female Today's Date: 05/16/2022  PCP: Rob Hickman primary care REFERRING PROVIDER: Dr. Naaman Plummer   OT End of Session - 05/16/22 1050     Visit Number 5    Number of Visits 25    Date for OT Re-Evaluation 07/04/22    Authorization Type [employer]    Authorization - Visit Number 1    Progress Note Due on Visit 10    OT Start Time 0933    OT Stop Time 1013    OT Time Calculation (min) 40 min    Behavior During Therapy WFL for tasks assessed/performed              Past Medical History:  Diagnosis Date   Allergy    Anemia    Arthritis    "joints" (09/29/2013)   Breast cancer ([place])    GERD (gastroesophageal reflux disease)    Heart murmur    History of blood transfusion    "related to chemo/breast cancer" (09/29/2013)   Hypertension    Migraines    Personal history of chemotherapy    Personal history of radiation therapy    Radiation 11/05/13-12/24/13   Right breast    TMJ (temporomandibular joint syndrome)    "left; just dx'd" (09/29/2013   Past Surgical History:  Procedure Laterality Date   AXILLARY LYMPH NODE BIOPSY Right 03/10/2013   Procedure: AXILLARY LYMPH NODE BIOPSY;  Surgeon: Adin Hector, MD;  Location: [place];  Service: General;  Laterality: Right;  sentinel node with blue dye   BREAST BIOPSY     BREAST LUMPECTOMY Right 09/29/2013   needle localization w/axillary LND/notes 09/29/2013   BREAST LUMPECTOMY WITH NEEDLE LOCALIZATION AND AXILLARY LYMPH NODE DISSECTION Right 09/29/2013   Procedure: RIGHT BREAST NEEDLE LOCALIZED LUMPECTOMY AND AXILLARY LYMPH NODE DISSECTION;  Surgeon: Adin Hector, MD;  Location: [place];  Service: General;  Laterality: Right;   COLONOSCOPY N/A 04/30/2013   Procedure: COLONOSCOPY;  Surgeon: Wonda Horner, MD;  [facility];  Service: Endoscopy;  Laterality: N/A;   CRANIOTOMY Right 03/11/2022   Procedure: CRANIOTOMY TUMOR  EXCISION;  Surgeon: Meade Maw, MD;  Location: [facility];  Service: Neurosurgery;  Laterality: Right;   ESOPHAGOGASTRODUODENOSCOPY N/A 04/28/2013   Procedure: ESOPHAGOGASTRODUODENOSCOPY (EGD);  Surgeon: Wonda Horner, MD;  Location: [facility];  Service: Endoscopy;  Laterality: N/A;   MASTECTOMY Right 09/29/2013   "partial"   PORT-A-CATH REMOVAL  09/29/2013   PORT-A-CATH REMOVAL Left 09/29/2013   Procedure: REMOVAL PORT-A-CATH;  Surgeon: Adin Hector, MD;  Location: [place];  Service: General;  Laterality: Left;   PORTACATH PLACEMENT N/A 03/10/2013   Procedure: INSERTION PORT-A-CATH WITH FLUOROSCOPY AND ULTRASOUND;  Surgeon: Adin Hector, MD;  Location: [place];  Service: General;  Laterality: N/A;   SURAL NERVE BX Right 05/13/2014   Procedure: SURAL NERVE BIOPSY;  Surgeon: Hosie Spangle, MD;  Location: [facility];  Service: Neurosurgery;  Laterality: Right;  Right sural nerve biopsy   TONSILLECTOMY     TOTAL ABDOMINAL HYSTERECTOMY     With bilateral salpingo-oophorectomy   Patient Active Problem List   Diagnosis Date Noted   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, right ([place]) 04/13/2022   Brain tumor ([place]) 03/17/2022   Palliative care encounter    Brain mass 03/09/2022   Seizure ([place]) 03/09/2022   Neuropathy 06/11/2014   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   Gait abnormality 01/26/2014  Unspecified hereditary and idiopathic peripheral neuropathy 01/26/2014   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[place]) 01/20/2014   Neuropathic pain 01/20/2014   Anxiety 01/20/2014   Unspecified deficiency anemia 01/20/2014   Hx of neutropenia 08/04/2013   Jaw pain 08/04/2013   Leukocytosis 04/27/2013   Pericardial effusion 04/27/2013   Cancer of central portion of right female breast ([place]) 02/04/2013   Need for influenza vaccination 11/29/2011   Hypertriglyceridemia 03/07/2011   Post-menopausal 03/07/2011   Anemia 12/17/2010   Heart murmur 12/17/2010    Seasonal allergies 12/17/2010   HYPOKALEMIA 02/19/2009   Essential hypertension, benign 04/09/2008    ONSET DATE: 03/11/22, craniotomy  REFERRING DIAG: D49.6 (ICD-10-CM) - Brain tumor ([place])  THERAPY DIAG:  Muscle weakness (generalized)  Other abnormalities of gait and mobility  Unsteadiness on feet  Other lack of coordination  Neurologic neglect syndrome  Other disturbances of skin sensation  Rationale for Evaluation and Treatment Rehabilitation  SUBJECTIVE:   SUBJECTIVE STATEMENT: I had radiation yesterday, so I'm slower today Pt accompanied by: family member  PERTINENT HISTORY:  PMH significant for breast CA in remission for 7 years,  status post lobectomy, chemotherapy, radiation, hypertension, hypothyroidism, severe peripheral neuropathy status post IVIG, hx of seizure. 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 Pt was seen on CIR and d/c home 03/27/22  PRECAUTIONS: Fall  WEIGHT BEARING RESTRICTIONS No  PAIN:  Are you having pain? No   PATIENT GOALS to be more independent  OBJECTIVE:   HAND DOMINANCE: Right  HAND FUNCTION: Grip strength: Right: 36.5 lbs; Left: 41.0 lbs  COORDINATION: 9 Hole Peg test: Right: 1 min 43 secs  Left: 7pegs in 2 min   Box and Blocks:  Right 37 blocks, Left  20 blocks   VISION ASSESSMENT: Visual Fields: per chart left visual field deficit,  pt's dtr reports L neglect  PERCEPTION: Impaired: Inattention/neglect: does not attend to left side of body   TODAY'S TREATMENT:  Tabletop scanning activity to match analog clocks with digital clocks, mod-max v.c  for organized scanning pattern, and to locate items Environmental scanning 4/15=73%, located on first pass, mod  v.c. for organized scanning pattern, increased time required Sit-stand min v.c for safe performance   PATIENT EDUCATION: Education details: visual scanning strategies Person educated: Patient Education method: Consulting civil engineer, Demonstration, Verbal cues,   Education comprehension: verbalized understanding, needs reinforcement, returned demonstration, mod v.c    HOME EXERCISE PROGRAM: 05/02/22: Visual scanning strategies 05/04/22: Coordination HEP  GOALS:   SHORT TERM GOALS: Target date:    I with initial HEP  Goal status: IN PROGRESS  2.  Pt will perform dressing with supervision only.  Goal status: INITIAL  3.  Pt will verbalize understanding of compensatory strategies for visual deficits/ left neglect and sensory deficits.  Goal status: IN PROGRESS  4.  Pt will demonstrate improved bilateral UE functional use as evidenced by increasing box/ blocks score by 5 blocks bilaterally. Baseline: Box and Blocks:  Right 37 blocks, Left  20 blocks Goal status: INITIAL  5.  Pt will perform functional tabletop scanning activities with 90% or better accuracy  Goal status: IN PROGRESS  6.  Improve grip strength Rt hand by 5 lbs Baseline: Rt = 36.5 lbs, Lt = 41.0 LBS Goal status: INITIAL  LONG TERM GOALS: Target date: 07/04/22  I with updated HEP.  Goal status: INITIAL  2.  Pt will perform all basic ADLs mod I   Goal status: INITIAL  3.  Pt will perform basic home management with  supervision demonstrating good safety awareness.  Goal status: INITIAL  4.  Pt will perform environmental scanning in a mod distracting environment with 90% accuracy and no LOB.  Goal status: INITIAL  5.  Pt will perform basic cooking task with min A demonstrating good safety awareness  Goal status: INITIAL  6.  Pt will demonstrate improved LUE fine motor coordination as evidenced by placing 9 pegs in 2 mins. Baseline: 7 pegs in 2 mins Goal status: INITIAL  7. Pt  will demonstrate improved fine motor coordination as evidenced by decreasing 9 hole peg test score to 90 secs or less RUE  Baseline: 1 min 43 secs initial   Goal status: initial  ASSESSMENT:  CLINICAL IMPRESSION: Pt is progressing slowly towards goals. She continues to require  v.c for organized scan pattern and scanning to left.  PERFORMANCE DEFICITS in functional skills including ADLs, IADLs, coordination, dexterity, proprioception, sensation, strength, FMC, GMC, mobility, balance, endurance, decreased knowledge of precautions, decreased knowledge of use of DME, vision, and UE functional use, cognitive skills including attention, learn, memory, perception, problem solving, safety awareness, sequencing, thought, and understand, and psychosocial skills including coping strategies, environmental adaptation, habits, interpersonal interactions, and routines and behaviors.   IMPAIRMENTS are limiting patient from ADLs, IADLs, play, leisure, and social participation.   COMORBIDITIES may have co-morbidities  that affects occupational performance. Patient will benefit from skilled OT to address above impairments and improve overall function.  MODIFICATION OR ASSISTANCE TO COMPLETE EVALUATION: No modification of tasks or assist necessary to complete an evaluation.  OT OCCUPATIONAL PROFILE AND HISTORY: Detailed assessment: Review of records and additional review of physical, cognitive, psychosocial history related to current functional performance.  CLINICAL DECISION MAKING: LOW - limited treatment options, no task modification necessary  REHAB POTENTIAL: Good  EVALUATION COMPLEXITY: Low    PLAN: OT FREQUENCY: 2x/week  OT DURATION: 12 weeks plus eval  PLANNED INTERVENTIONS: self care/ADL training, therapeutic exercise, therapeutic activity, neuromuscular re-education, manual therapy, passive range of motion, gait training, balance training, functional mobility training, aquatic therapy, fluidotherapy, moist heat, cryotherapy, patient/family education, cognitive remediation/compensation, visual/perceptual remediation/compensation, energy conservation, coping strategies training, DME and/or AE instructions, and Re-evaluation  RECOMMENDED OTHER SERVICES: PT, ST  CONSULTED  AND AGREED WITH PLAN OF CARE: Patient and family member/caregiver  PLAN FOR NEXT SESSION:  LUE functional use and NMR   Rilla Buckman, OT 05/16/2022, 10:51 AM Theone Murdoch, OTR/L [phone] Phone: [phone] 10:51 AM 05/16/22

## 2022-05-16 NOTE — Therapy (Signed)
OUTPATIENT PHYSICAL THERAPY NEURO TREATMENT   Patient Name: Tabitha Ramirez MRN: [id number] DOB:1946/07/22, 76 y.o., female Today's Date: 05/16/2022   PCP: [facility]  REFERRING PROVIDER: Cathlyn Parsons, PA-C    PT End of Session - 05/16/22 1018     Visit Number 6    Number of Visits 13   plus eval   Date for PT Re-Evaluation 05/30/22    Authorization Type [employer]    Progress Note Due on Visit 10    PT Start Time 1017    PT Stop Time 1059    PT Time Calculation (min) 42 min    Activity Tolerance Patient tolerated treatment well    Behavior During Therapy WFL for tasks assessed/performed;Flat affect                  Past Medical History:  Diagnosis Date   Allergy    Anemia    Arthritis    "joints" (09/29/2013)   Breast cancer ([place])    GERD (gastroesophageal reflux disease)    Heart murmur    History of blood transfusion    "related to chemo/breast cancer" (09/29/2013)   Hypertension    Migraines    Personal history of chemotherapy    Personal history of radiation therapy    Radiation 11/05/13-12/24/13   Right breast    TMJ (temporomandibular joint syndrome)    "left; just dx'd" (09/29/2013   Past Surgical History:  Procedure Laterality Date   AXILLARY LYMPH NODE BIOPSY Right 03/10/2013   Procedure: AXILLARY LYMPH NODE BIOPSY;  Surgeon: Adin Hector, MD;  Location: [facility];  Service: General;  Laterality: Right;  sentinel node with blue dye   BREAST BIOPSY     BREAST LUMPECTOMY Right 09/29/2013   needle localization w/axillary LND/notes 09/29/2013   BREAST LUMPECTOMY WITH NEEDLE LOCALIZATION AND AXILLARY LYMPH NODE DISSECTION Right 09/29/2013   Procedure: RIGHT BREAST NEEDLE LOCALIZED LUMPECTOMY AND AXILLARY LYMPH NODE DISSECTION;  Surgeon: Adin Hector, MD;  Location: [facility];  Service: General;  Laterality: Right;   COLONOSCOPY N/A 04/30/2013   Procedure: COLONOSCOPY;  Surgeon: Wonda Horner, MD;  Location: [facility];  Service:  Endoscopy;  Laterality: N/A;   CRANIOTOMY Right 03/11/2022   Procedure: CRANIOTOMY TUMOR EXCISION;  Surgeon: Meade Maw, MD;  Location: [facility];  Service: Neurosurgery;  Laterality: Right;   ESOPHAGOGASTRODUODENOSCOPY N/A 04/28/2013   Procedure: ESOPHAGOGASTRODUODENOSCOPY (EGD);  Surgeon: Wonda Horner, MD;  Location: [facility];  Service: Endoscopy;  Laterality: N/A;   MASTECTOMY Right 09/29/2013   "partial"   PORT-A-CATH REMOVAL  09/29/2013   PORT-A-CATH REMOVAL Left 09/29/2013   Procedure: REMOVAL PORT-A-CATH;  Surgeon: Adin Hector, MD;  Location: [facility];  Service: General;  Laterality: Left;   PORTACATH PLACEMENT N/A 03/10/2013   Procedure: INSERTION PORT-A-CATH WITH FLUOROSCOPY AND ULTRASOUND;  Surgeon: Adin Hector, MD;  Location: [facility];  Service: General;  Laterality: N/A;   SURAL NERVE BX Right 05/13/2014   Procedure: SURAL NERVE BIOPSY;  Surgeon: Hosie Spangle, MD;  Location: [facility];  Service: Neurosurgery;  Laterality: Right;  Right sural nerve biopsy   TONSILLECTOMY     TOTAL ABDOMINAL HYSTERECTOMY     With bilateral salpingo-oophorectomy   Patient Active Problem List   Diagnosis Date Noted   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, right ([place]) 04/13/2022   Brain tumor ([place]) 03/17/2022   Palliative care encounter    Brain mass 03/09/2022   Seizure ([place]) 03/09/2022  Neuropathy 06/11/2014   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   Gait abnormality 01/26/2014   Unspecified hereditary and idiopathic peripheral neuropathy 01/26/2014   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[place]) 01/20/2014   Neuropathic pain 01/20/2014   Anxiety 01/20/2014   Unspecified deficiency anemia 01/20/2014   Hx of neutropenia 08/04/2013   Jaw pain 08/04/2013   Leukocytosis 04/27/2013   Pericardial effusion 04/27/2013   Cancer of central portion of right female breast ([place]) 02/04/2013   Need for influenza vaccination 11/29/2011   Hypertriglyceridemia  03/07/2011   Post-menopausal 03/07/2011   Anemia 12/17/2010   Heart murmur 12/17/2010   Seasonal allergies 12/17/2010   HYPOKALEMIA 02/19/2009   Essential hypertension, benign 04/09/2008    ONSET DATE: 03/21/2022 (referral)  REFERRING DIAG: D49.6 (ICD-10-CM) - Brain tumor ([place])   THERAPY DIAG:  Other abnormalities of gait and mobility  Unsteadiness on feet  Other lack of coordination  Rationale for Evaluation and Treatment Rehabilitation  SUBJECTIVE:                                                                                                                                                                                             SUBJECTIVE STATEMENT: Pt reports she did not sleep well last night, very tired today. States she tried to log her activity in the [employer] and it would not work, requesting a printed chart to keep track of her exercises.   Pt accompanied by: self  PERTINENT HISTORY: hypertension, hypothyroidism, tobacco use, peripheral neuropathy followed by Dr. 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 Neuro biopsy 04/2014 showed moderate severe loss of myelinated fiber.   PAIN:  Are you having pain? Yes: NPRS scale: 6/10 Pain location: Bilateral hands Pain description: throbbing/achy   PRECAUTIONS: Fall  WEIGHT BEARING RESTRICTIONS No  FALLS: Has patient fallen in last 6 months?  No falls but has had one near miss while performing bed mobility   PATIENT GOALS "Mobility I guess" "Independence is my main thing"    OBJECTIVE:   TODAY'S TREATMENT:  NMR  In // bars for improved midline orientation, reaching out of BOS, attention to L side and BLE coordination: -Posterolateral cone reach and cross-body stack, x9 cones w/min A for steadying assist, x1 per side. Noted pt leaning significantly to R side despite mod cues for midline orientation and use of mirror for visual biofeedback on body position. Instructed pt to  perform without UE support. Pt unable to perform to L side without RUE support but reported it was easier on L side, seemingly unaware she was holding on w/RUE throughout. Progressed to performing x1 per side while  standing on Airex for increased balance challenge, pt requiring frequent min A due to anterolateral LOB to R side. Pt required frequent verbal cueing for midline orientation throughout and to maintain attention to L hand when reaching to L side.  -Standing on Airex w/EO and no UE support w/emphasis on finding midline orientation as pt stands to R side of foam despite cues. Pt able to hold x2 minutes w/occasional anterior LOB.  -Alt cone taps while standing on airex w/RUE support, x15 per side. Pt demonstrated poor placement of LLE throughout, frequently not bringing L foot back to foam and letting it hang off anterior edge. Pt also frequently regressed to tapping w/RLE only, requiring min cues to sustain alternating sequence. Progressed to double cone taps for improved single leg stability and coordination and pt required BUE support to perform, x15 per side.  -Standing on rockerboard in L/R direction for improved midline orientation, lateral weight shifting and tactile biofeedback on body position. Pt initially required BUE support and progressed to no UE support, x 3 minutes. Pt demonstrated frequent overcorrection to R side when losing balance to L, requiring UE support to correct.    PATIENT EDUCATION: Education details: Provided handout for HEP tracker, continue HEP  [employer]  [id number] Person educated: Patient Education method: Explanation, Demonstration, and Handouts Education comprehension: verbalized understanding and needs further education  Access Code: [email or web address] URL: [email or web address] Date: 05/02/2022 Prepared by: Ethelene Browns  Exercises - Supine Bridge  - 1 x daily - 7 x weekly - 1 sets - 10 reps - 3-5 secs hold - Single Leg Bridge  - 1 x daily - 7 x  weekly - 1 sets - 10 reps - 3 sec hold - Sidelying Hip Abduction  - 1 x daily - 7 x weekly - 1 sets - 10 reps - 2 sec hold - Clamshell  - 1 x daily - 7 x weekly - 3 sets - 10 reps - 3-5 sec hold hold - Standing Hip Flexion with Counter Support  - 1 x daily - 7 x weekly - 1 sets - 10 reps - Standing Hip Extension with Counter Support  - 1 x daily - 7 x weekly - 1 sets - 10 reps - Standing Hip Abduction with Counter Support  - 1 x daily - 7 x weekly - 3 sets - 10 reps  GOALS: Goals reviewed with patient? Yes   SHORT TERM GOALS: Target date: 05/09/2022   Pt will perform initial HEP w/S* from daughter for improved strength, balance, transfers and gait.  Baseline: not established on eval  Goal status: MET   2.  Pt will improve gait velocity to at least 2.2 ft/s with LRAD for improved gait efficiency and confidence with capabilities    Baseline: 1.82 ft/s w/RW, 2.08 ft/sec with RW (8/10), 2.15 ft/sec (8/15) Goal status: IN PROGRESS   3.  Pt will improve normal TUG to less than or equal to 20 seconds w/LRAD for improved functional mobility and decreased fall risk.   Baseline: 25.84s w/RW, 25 sec with RW (8/10), 23 sec with RW (8/15) Goal status: IN PROGRESS    4.  Berg to be assessed and LTG written Baseline: 38/56 (04/21/22) Goal status: MET     LONG TERM GOALS: Target date: 05/23/2022   Pt will be independent with final HEP for improved strength, balance, transfers and gait.   Baseline:  Goal status: INITIAL   2.  Pt will improve Berg score to >/= 46/56 to demonstrate decreased fall risk  Baseline: 38/56 (04/21/22) Goal status: INITIAL   3.  Pt will ambulate >500' on level and unlevel surfaces w/LRAD mod I for return to community mobility and improved visual scanning  Baseline:  Goal status: INITIAL   4.  Pt will improve 5 x STS to less than or equal to 15 seconds without UE support to demonstrate improved functional strength and transfer efficiency.    Baseline: 16.72s w/BUE  support  Goal status: INITIAL   5.  Pt will improve gait velocity to at least 2.7 ft/s w/LRAD for improved gait efficiency and performance at community ambulator level    Baseline: 1.82 ft/s w/RW, 2.08 ft/sec with RW (8/10) Goal status: INITIAL   6. Pt and daughter will verbalize fall prevention strategies to be used in the home and community environment for reduced fall risk             Baseline:             Goal Status: INITIAL     ASSESSMENT:   CLINICAL IMPRESSION: Emphasis of skilled PT session on BLE coordination, attention to L side, dynamic balance and single leg stability. Pt continues to require mod-max multimodal cues for midline orientation and frequently loses balance anterolaterally to R side w/challenging tasks. Pt does not respond well to visual or verbal cues, will incorporate more tactile cues next session. Continue POC.     OBJECTIVE IMPAIRMENTS decreased balance, decreased cognition, decreased coordination, decreased knowledge of condition, decreased knowledge of use of DME, difficulty walking, decreased safety awareness, impaired perceived functional ability, impaired sensation, and impaired vision/preception.    ACTIVITY LIMITATIONS lifting, bending, squatting, transfers, bed mobility, dressing, and caring for others   PARTICIPATION LIMITATIONS: meal prep, cleaning, laundry, medication management, personal finances, driving, shopping, community activity, and yard work   [place], Transportation, and 1 comorbidity: L inattention  are also affecting patient's functional outcome.    REHAB POTENTIAL: Good   CLINICAL DECISION MAKING: Stable/uncomplicated   EVALUATION COMPLEXITY: Low   PLAN: PT FREQUENCY: 2x/week   PT DURATION: 6 weeks   PLANNED INTERVENTIONS: Therapeutic exercises, Therapeutic activity, Neuromuscular re-education, Balance training, Gait training, Patient/Family education, Self Care, Joint mobilization, Visual/preceptual  remediation/compensation, DME instructions, and Re-evaluation   PLAN FOR NEXT SESSION: work on L inattention, progress to gait w/reduced AD support (SPC with rubber quad tip), dynamic balance with decreased UE support, LLE step overs and hurdles, resisted step-taps      Charlett Nose, PT, DPT [place] [address] [place], [place]  [address] Phone:  [phone] Fax:  [phone] 05/16/22 10:55am

## 2022-05-18 ENCOUNTER — Other Ambulatory Visit: Payer: Self-pay | Admitting: Hematology and Oncology

## 2022-05-18 ENCOUNTER — Ambulatory Visit: Payer: Medicare PPO | Admitting: Occupational Therapy

## 2022-05-18 ENCOUNTER — Encounter: Payer: Medicare PPO | Admitting: Speech Pathology

## 2022-05-18 ENCOUNTER — Ambulatory Visit: Payer: Medicare PPO

## 2022-05-18 MED ORDER — OXYCODONE-ACETAMINOPHEN 5-325 MG PO TABS: 1.0000 | ORAL_TABLET | Freq: Three times a day (TID) | ORAL | 0 refills | Status: DC | PRN

## 2022-05-22 ENCOUNTER — Other Ambulatory Visit: Payer: Self-pay

## 2022-05-22 ENCOUNTER — Encounter: Payer: Self-pay | Admitting: Nurse Practitioner

## 2022-05-22 ENCOUNTER — Inpatient Hospital Stay: Payer: Medicare PPO | Attending: Hematology and Oncology | Admitting: Nurse Practitioner

## 2022-05-22 VITALS — BP 169/89 | HR 72 | Temp 98.3°F | Resp 16 | Ht 64.0 in | Wt 140.0 lb

## 2022-05-22 DIAGNOSIS — M792 Neuralgia and neuritis, unspecified: Secondary | ICD-10-CM | POA: Diagnosis not present

## 2022-05-22 DIAGNOSIS — Z79811 Long term (current) use of aromatase inhibitors: Secondary | ICD-10-CM | POA: Diagnosis not present

## 2022-05-22 DIAGNOSIS — C50911 Malignant neoplasm of unspecified site of right female breast: Secondary | ICD-10-CM

## 2022-05-22 DIAGNOSIS — C7931 Secondary malignant neoplasm of brain: Secondary | ICD-10-CM | POA: Diagnosis present

## 2022-05-22 DIAGNOSIS — C50111 Malignant neoplasm of central portion of right female breast: Secondary | ICD-10-CM | POA: Diagnosis present

## 2022-05-22 DIAGNOSIS — T451X5A Adverse effect of antineoplastic and immunosuppressive drugs, initial encounter: Secondary | ICD-10-CM

## 2022-05-22 DIAGNOSIS — Z515 Encounter for palliative care: Secondary | ICD-10-CM

## 2022-05-22 DIAGNOSIS — G629 Polyneuropathy, unspecified: Secondary | ICD-10-CM | POA: Insufficient documentation

## 2022-05-22 DIAGNOSIS — G620 Drug-induced polyneuropathy: Secondary | ICD-10-CM | POA: Diagnosis not present

## 2022-05-22 DIAGNOSIS — Z7189 Other specified counseling: Secondary | ICD-10-CM

## 2022-05-22 DIAGNOSIS — G893 Neoplasm related pain (acute) (chronic): Secondary | ICD-10-CM

## 2022-05-22 MED ORDER — DULOXETINE HCL 30 MG PO CPEP: 30.0000 mg | ORAL_CAPSULE | Freq: Every day | ORAL | 0 refills | Status: DC

## 2022-05-22 NOTE — Progress Notes (Signed)
[place]  [phone] Fax:(336) [phone]   Name: Tabitha Ramirez Date: 05/22/2022 MRN: [id number]  DOB: [birth year]  Patient Care Team: [place] as PCP - General (Family Medicine) Magrinat, Virgie Dad, MD (Inactive) as Consulting Physician (Oncology)    REASON FOR CONSULTATION: Tabitha Ramirez is a 76 y.o. female with medical history including metastatic triple negative breast cancer s/p chemoradiation.  Palliative ask to see for symptom management and goals of care.    SOCIAL HISTORY:     reports that she has quit smoking. Tabitha smoking use included cigarettes. She has never used smokeless tobacco. She reports that she does not drink alcohol and does not use drugs.  ADVANCE DIRECTIVES:    CODE STATUS:   PAST MEDICAL HISTORY: Past Medical History:  Diagnosis Date   Allergy    Anemia    Arthritis    "joints" (09/29/2013)   Breast cancer ([place])    GERD (gastroesophageal reflux disease)    Heart murmur    History of blood transfusion    "related to chemo/breast cancer" (09/29/2013)   Hypertension    Migraines    Personal history of chemotherapy    Personal history of radiation therapy    Radiation 11/05/13-12/24/13   Right breast    TMJ (temporomandibular joint syndrome)    "left; just dx'd" (09/29/2013    PAST SURGICAL HISTORY:  Past Surgical History:  Procedure Laterality Date   AXILLARY LYMPH NODE BIOPSY Right 03/10/2013   Procedure: AXILLARY LYMPH NODE BIOPSY;  Surgeon: Adin Hector, MD;  Location: [place];  Service: General;  Laterality: Right;  sentinel node with blue dye   BREAST BIOPSY     BREAST LUMPECTOMY Right 09/29/2013   needle localization w/axillary LND/notes 09/29/2013   BREAST LUMPECTOMY WITH NEEDLE LOCALIZATION AND AXILLARY LYMPH NODE DISSECTION Right 09/29/2013   Procedure: RIGHT BREAST NEEDLE LOCALIZED LUMPECTOMY AND AXILLARY LYMPH NODE DISSECTION;  Surgeon: Adin Hector, MD;   Location: [place];  Service: General;  Laterality: Right;   COLONOSCOPY N/A 04/30/2013   Procedure: COLONOSCOPY;  Surgeon: Wonda Horner, MD;  Location: [facility];  Service: Endoscopy;  Laterality: N/A;   CRANIOTOMY Right 03/11/2022   Procedure: CRANIOTOMY TUMOR EXCISION;  Surgeon: Meade Maw, MD;  Location: [facility];  Service: Neurosurgery;  Laterality: Right;   ESOPHAGOGASTRODUODENOSCOPY N/A 04/28/2013   Procedure: ESOPHAGOGASTRODUODENOSCOPY (EGD);  Surgeon: Wonda Horner, MD;  Location: [facility];  Service: Endoscopy;  Laterality: N/A;   MASTECTOMY Right 09/29/2013   "partial"   PORT-A-CATH REMOVAL  09/29/2013   PORT-A-CATH REMOVAL Left 09/29/2013   Procedure: REMOVAL PORT-A-CATH;  Surgeon: Adin Hector, MD;  Location: [place];  Service: General;  Laterality: Left;   PORTACATH PLACEMENT N/A 03/10/2013   Procedure: INSERTION PORT-A-CATH WITH FLUOROSCOPY AND ULTRASOUND;  Surgeon: Adin Hector, MD;  Location: [place];  Service: General;  Laterality: N/A;   SURAL NERVE BX Right 05/13/2014   Procedure: SURAL NERVE BIOPSY;  Surgeon: Hosie Spangle, MD;  Location: [facility];  Service: Neurosurgery;  Laterality: Right;  Right sural nerve biopsy   TONSILLECTOMY     TOTAL ABDOMINAL HYSTERECTOMY     With bilateral salpingo-oophorectomy    HEMATOLOGY/ONCOLOGY HISTORY:  Oncology History  Cancer of central portion of right female breast ([place])  01/31/2013 Mammogram   Area of distortion upper-outer quadrant right breast diffuse calcifications in both breasts 2.5 cm with prominent right axillary lymph node 2.5 cm  01/31/2013 Initial Diagnosis   Cancer of central portion of female breast: 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 Ki-67 84%; sentinel lymph node sampling 03/10/2013 showed1/2 SLN pos PR 3% PR 6% Ki-67 15% Tabitha-2 negative   02/10/2013 Breast MRI   Right breast: Rim enhancing necrotic mass 3.4 cm with a 2 cm level I axillary lymph node   03/17/2013 - 08/25/2013 Neo-Adjuvant  Chemotherapy   Dose dense Adriamycin and Cytoxan Followed by weekly Taxol and carboplatin x12; 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  MEDICATIONS:  Current Outpatient Medications  Medication Sig Dispense Refill   acetaminophen (TYLENOL) 325 MG tablet Take 1-2 tablets (325-650 mg total) by mouth every 4 (four) hours as needed for mild pain.     anastrozole (ARIMIDEX) 1 MG tablet Take 1 tablet (1 mg total) by mouth daily. 90 tablet 3   Cholecalciferol (D3-1000) 25 MCG (1000 UT) capsule Take 1 capsule (1,000 Units total) by mouth daily. 30 capsule 0   ferrous sulfate 325 (65 FE) MG tablet Take 1 tablet (325 mg total) by mouth daily with breakfast. 30 tablet 3   levETIRAcetam (KEPPRA) 500 MG tablet Take 1 tablet (500 mg total) by mouth 2 (two) times daily. 120 tablet 0   levothyroxine (SYNTHROID) 50 MCG tablet Take 1 tablet (50 mcg total) by mouth daily. 30 tablet 0   LORazepam (ATIVAN) 0.5 MG tablet Take 1 tablet (0.5 mg total) by mouth as needed for anxiety (take 1 tablet 30 minutes prior to MRI scans and radiation visits, may repeat once, just prior to procedures if needed). 30 tablet 0   oxyCODONE-acetaminophen (PERCOCET/ROXICET) 5-325 MG tablet Take 1 tablet by mouth every 8 (eight) hours as needed for severe pain. 60 tablet 0   potassium chloride (KLOR-CON) 10 MEQ tablet Take 10 mEq by mouth daily. (Patient not taking: Reported on 04/21/2022)     rosuvastatin (CRESTOR) 10 MG tablet Take 1 tablet (10 mg total) by mouth daily. 30 tablet 0   vitamin B-12 (CYANOCOBALAMIN) 1000 MCG tablet Take 1 tablet (1,000 mcg total) by mouth daily. 30  tablet 0   No current facility-administered medications for this visit.    VITAL SIGNS: BP (!) 169/89 (BP Location: Right Arm, Patient Position: Sitting)   Pulse 72   Temp 98.3 F (36.8 C) (Oral)   Resp 16   Ht _0  (1.626 m)   Wt 140 lb (63.5 kg)   SpO2 100%   BMI 24.03 kg/m  Filed Weights   05/22/22 0934  Weight: 140 lb (63.5 kg)    Estimated body mass index is 24.03 kg/m as calculated from the following:   Height as of this encounter: _1  (1.626 m).   Weight as of this encounter: 140 lb (63.5 kg).  LABS: CBC:    Component Value Date/Time   WBC 7.4 03/27/2022 0552   HGB 9.1 (L) 03/27/2022 0552   HGB 10.7 (L) 06/22/2014 1107   HCT 27.7 (L) 03/27/2022 0552   HCT 32.5 (L) 06/22/2014 1107   PLT 294 03/27/2022 0552   PLT 284 06/22/2014 1107   MCV 102.6 (H) 03/27/2022 8003  MCV 102.2 (H) 06/22/2014 1107   NEUTROABS 6.4 03/20/2022 0623   NEUTROABS 2.8 06/22/2014 1107   LYMPHSABS 2.2 03/20/2022 0623   LYMPHSABS 2.4 06/22/2014 1107   MONOABS 0.9 03/20/2022 0623   MONOABS 0.7 06/22/2014 1107   EOSABS 0.4 03/20/2022 0623   EOSABS 0.2 06/22/2014 1107   BASOSABS 0.0 03/20/2022 0623   BASOSABS 0.0 06/22/2014 1107   Comprehensive Metabolic Panel:    Component Value Date/Time   NA 139 03/27/2022 0552   NA 140 03/30/2015 0944   K 4.0 03/27/2022 0552   K 3.8 03/30/2015 0944   CL 105 03/27/2022 0552   CL 100 03/13/2013 0918   CO2 25 03/27/2022 0552   CO2 26 03/30/2015 0944   BUN 24 (H) 03/27/2022 0552   BUN 18.9 03/30/2015 0944   CREATININE 1.03 (H) 03/27/2022 0552   CREATININE 0.9 03/30/2015 0944   GLUCOSE 94 03/27/2022 0552   GLUCOSE 103 03/30/2015 0944   GLUCOSE 169 (H) 03/13/2013 0918   CALCIUM 9.0 03/27/2022 0552   CALCIUM 9.7 03/30/2015 0944   AST 28 03/20/2022 0623   AST 20 03/30/2015 0944   ALT 42 03/20/2022 0623   ALT 19 03/30/2015 0944   ALKPHOS 59 03/20/2022 0623   ALKPHOS 100 03/30/2015 0944   BILITOT 0.5 03/20/2022 0623   BILITOT 0.45  03/30/2015 0944   PROT 6.2 (L) 03/20/2022 0623   PROT 7.9 03/30/2015 0944   ALBUMIN 2.5 (L) 03/20/2022 0623   ALBUMIN 3.1 (L) 03/30/2015 0944    RADIOGRAPHIC STUDIES: MR Brain W Wo Contrast  Result Date: 04/25/2022 CLINICAL DATA:  Follow-up brain metastases. EXAM: MRI HEAD WITHOUT AND WITH CONTRAST TECHNIQUE: Multiplanar, multiecho pulse sequences of the brain and surrounding structures were obtained without and with intravenous contrast. CONTRAST:  35m MULTIHANCE GADOBENATE DIMEGLUMINE 529 MG/ML IV SOLN COMPARISON:  03/12/2022 FINDINGS: Brain: Evolving blood products in the right parietal resection cavity. No worrisome enhancement at the margins which can be accounted for by evolving hematoma and recent surgery. Mild T2 signal in the adjacent brain, especially medially and superiorly attributed to mild reactive edema. Expected evolution of infarct inferior to the resection cavity. No new lesion. Vascular: Major flow voids and vascular enhancements are preserved Skull and upper cervical spine: Unremarkable right parietal craniotomy Sinuses/Orbits: Negative IMPRESSION: 1. Evolving blood products in the right parietal resection cavity with reactive appearing peripheral enhancement. 2. No new metastatic disease. Electronically Signed   By: JJorje GuildM.D.   On: 04/25/2022 08:02    PERFORMANCE STATUS (ECOG) : 1 - Symptomatic but completely ambulatory  Review of Systems  Neurological:  Positive for weakness.       Neuropathy  Unless otherwise noted, a complete review of systems is negative.  Physical Exam General: NAD Cardiovascular: regular rate and rhythm Pulmonary: normal breathing pattern  Abdomen: soft, nontender, + bowel sounds Extremities: no edema, no joint deformities Skin: no rashes Neurological: AAO x3, mood appropriate  IMPRESSION: This is my initial visit with Tabitha Ramirez Tabitha Ramirez, Tabitha Ramirez present with Tabitha today. No acute distress. Ambulatory with a walker. Alert and  able to appropriately engage in discussions.   I introduced myself, [place] RN, and Palliative's role in collaboration with the oncology team. Concept of Palliative Care was introduced as specialized medical care for people and their families living with serious illness.  It focuses on providing relief from the symptoms and stress of a serious illness.  The goal is to improve quality of life for both the patient and  the family. Values and goals of care important to patient and family were attempted to be elicited.   Tabitha Ramirez lives in the home with Tabitha Ramirez (only child) and grandson. She is a retired Licensed conveyancer. Divorced.   She is able to perform ADLs with some assistance. Ambulatory with a walker. No longer drives. Complains of neuropathic pain and discomfort preventing Tabitha from being able to button and zip some clothing items. Also affects Tabitha grip. Appetite is good. Eats 3 meals daily with snacks.   Neuropathic pain Ms. Miranda endorses ongoing neuropathic pain and discomfort. Complains of pain, burning, numbness, tingling in hands and feet. Ramirez shares she was on gabapentin years ago but this was discontinued due to side effects.   She have been participating in neuro PT/OT which has helped support Tabitha overall functional state. She is unable to dress herself if requiring buttoning or zippers due to Tabitha neuropathy. Some difficulty opening food items and holding cups.   We discussed use of cymbalta. Education provided to she and Tabitha Ramirez regarding potential side effects. They verbalized understanding.   We will continue to closely monitor.   Goals of Care  We discussed Tabitha current illness and what it means in the larger context of Tabitha on-going co-morbidities. Natural disease trajectory and expectations were discussed.  C[place]y and Tabitha Ramirez are realistic in their understanding of Tabitha current illness. They are remaining hopeful for stability. Continue to treat the treatable allowing  Tabitha every opportunity to continue to thrive.   I discussed the importance of continued conversation with family and their medical providers regarding overall plan of care and treatment options, ensuring decisions are within the context of the patients values and GOCs.  PLAN: Established therapeutic relationship. Education provided on Palliative's role in collaboration with Tabitha oncology/radiation team.  Cymbalta 30 mg daily for neuropathic pain I will plan to see patient back in 2-4 weeks in collaboration to other oncology appointments. We will plan to give Tabitha a call later this week for close monitoring.    Patient expressed understanding and was in agreement with this plan. She also understands that She can call the clinic at any time with any questions, concerns, or complaints.   Thank you for your referral and allowing Palliative to assist in Ms. Tia Masker Aguiniga's care.   Number and complexity of problems addressed: HIGH - 1 or more chronic illnesses with SEVERE exacerbation, progression, or side effects of treatment - advanced cancer, pain. Any controlled substances utilized were prescribed in the context of palliative care.  Time Total: 50 min   Visit consisted of counseling and education dealing with the complex and emotionally intense issues of symptom management and palliative care in the setting of serious and potentially life-threatening illness.Greater than 50%  of this time was spent counseling and coordinating care related to the above assessment and plan.  Signed by: Alda Lea, AGPCNP-BC Palliative Medicine Team/[place]

## 2022-05-23 ENCOUNTER — Telehealth: Payer: Self-pay | Admitting: Nurse Practitioner

## 2022-05-23 ENCOUNTER — Ambulatory Visit: Payer: Medicare PPO | Admitting: Occupational Therapy

## 2022-05-23 ENCOUNTER — Encounter: Payer: Self-pay | Admitting: *Deleted

## 2022-05-23 ENCOUNTER — Encounter: Payer: Medicare PPO | Admitting: Speech Pathology

## 2022-05-23 ENCOUNTER — Ambulatory Visit: Payer: Medicare PPO | Admitting: Physical Therapy

## 2022-05-23 DIAGNOSIS — R2689 Other abnormalities of gait and mobility: Secondary | ICD-10-CM

## 2022-05-23 DIAGNOSIS — R208 Other disturbances of skin sensation: Secondary | ICD-10-CM

## 2022-05-23 DIAGNOSIS — D496 Neoplasm of unspecified behavior of brain: Secondary | ICD-10-CM

## 2022-05-23 DIAGNOSIS — R2681 Unsteadiness on feet: Secondary | ICD-10-CM

## 2022-05-23 DIAGNOSIS — R278 Other lack of coordination: Secondary | ICD-10-CM

## 2022-05-23 DIAGNOSIS — M6281 Muscle weakness (generalized): Secondary | ICD-10-CM

## 2022-05-23 DIAGNOSIS — R414 Neurologic neglect syndrome: Secondary | ICD-10-CM

## 2022-05-23 NOTE — Therapy (Signed)
OUTPATIENT PHYSICAL THERAPY NEURO TREATMENT   Patient Name: Tabitha Ramirez MRN: [id number] DOB:[birth year], 76 y.o., female Today's Date: 05/23/2022   PCP: [facility]  REFERRING PROVIDER: Cathlyn Parsons, PA-C    PT End of Session - 05/23/22 1102     Visit Number 7    Number of Visits 13   plus eval   Date for PT Re-Evaluation 05/30/22    Authorization Type [employer]    Progress Note Due on Visit 10    PT Start Time 1100    PT Stop Time 1142    PT Time Calculation (min) 42 min    Activity Tolerance Patient tolerated treatment well    Behavior During Therapy WFL for tasks assessed/performed;Flat affect                   Past Medical History:  Diagnosis Date   Allergy    Anemia    Arthritis    "joints" (09/29/2013)   Breast cancer ([place])    GERD (gastroesophageal reflux disease)    Heart murmur    History of blood transfusion    "related to chemo/breast cancer" (09/29/2013)   Hypertension    Migraines    Personal history of chemotherapy    Personal history of radiation therapy    Radiation 11/05/13-12/24/13   Right breast    TMJ (temporomandibular joint syndrome)    "left; just dx'd" (09/29/2013   Past Surgical History:  Procedure Laterality Date   AXILLARY LYMPH NODE BIOPSY Right 03/10/2013   Procedure: AXILLARY LYMPH NODE BIOPSY;  Surgeon: Adin Hector, MD;  Location: [place];  Service: General;  Laterality: Right;  sentinel node with blue dye   BREAST BIOPSY     BREAST LUMPECTOMY Right 09/29/2013   needle localization w/axillary LND/notes 09/29/2013   BREAST LUMPECTOMY WITH NEEDLE LOCALIZATION AND AXILLARY LYMPH NODE DISSECTION Right 09/29/2013   Procedure: RIGHT BREAST NEEDLE LOCALIZED LUMPECTOMY AND AXILLARY LYMPH NODE DISSECTION;  Surgeon: Adin Hector, MD;  Location: [place];  Service: General;  Laterality: Right;   COLONOSCOPY N/A 04/30/2013   Procedure: COLONOSCOPY;  Surgeon: Wonda Horner, MD;  Location: [facility];  Service:  Endoscopy;  Laterality: N/A;   CRANIOTOMY Right 03/11/2022   Procedure: CRANIOTOMY TUMOR EXCISION;  Surgeon: Meade Maw, MD;  Location: [facility];  Service: Neurosurgery;  Laterality: Right;   ESOPHAGOGASTRODUODENOSCOPY N/A 04/28/2013   Procedure: ESOPHAGOGASTRODUODENOSCOPY (EGD);  Surgeon: Wonda Horner, MD;  Location: [facility];  Service: Endoscopy;  Laterality: N/A;   MASTECTOMY Right 09/29/2013   "partial"   PORT-A-CATH REMOVAL  09/29/2013   PORT-A-CATH REMOVAL Left 09/29/2013   Procedure: REMOVAL PORT-A-CATH;  Surgeon: Adin Hector, MD;  Location: 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, right ([place]) 04/13/2022   Brain tumor ([place]) 03/17/2022   Palliative care encounter    Brain mass 03/09/2022   Seizure ([place])  03/09/2022   Neuropathy 06/11/2014   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   Gait abnormality 01/26/2014   Unspecified hereditary and idiopathic peripheral neuropathy 01/26/2014   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[place]) 01/20/2014   Neuropathic pain 01/20/2014   Anxiety 01/20/2014   Unspecified deficiency anemia 01/20/2014   Hx of neutropenia 08/04/2013   Jaw pain 08/04/2013   Leukocytosis 04/27/2013   Pericardial effusion 04/27/2013   Cancer of central portion of right female breast ([place]) 02/04/2013   Need for influenza vaccination 11/29/2011   Hypertriglyceridemia  03/07/2011   Post-menopausal 03/07/2011   Anemia 12/17/2010   Heart murmur 12/17/2010   Seasonal allergies 12/17/2010   HYPOKALEMIA 02/19/2009   Essential hypertension, benign 04/09/2008    ONSET DATE: 03/21/2022 (referral)  REFERRING DIAG: D49.6 (ICD-10-CM) - Brain tumor ([place])   THERAPY DIAG:  Other abnormalities of gait and mobility  Unsteadiness on feet  Other lack of coordination  Muscle weakness (generalized)  Rationale for Evaluation and Treatment Rehabilitation  SUBJECTIVE:                                                                                                                                                                                             SUBJECTIVE STATEMENT: Pt reports she is having more pain today due to arthritis as well as pain in her L breast. Pt reports she plans to take pain medication after therapy session as it makes her sleepy. Pt reports she has been doing her standing HEP but has not had time yet to do her supine/sidelying exercises due to having a lot of appointments. After her doctor's appointment yesterday she says she will be getting new meds for her neuropathy.  Pt accompanied by: self  PERTINENT HISTORY: hypertension, hypothyroidism, tobacco use, peripheral neuropathy followed by Dr. 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 Neuro biopsy 04/2014 showed moderate severe loss of myelinated fiber.   PAIN:  Are you having pain? Yes: NPRS scale: 5/10 Pain location: L breast Pain description: throbbing/achy   PRECAUTIONS: Fall  WEIGHT BEARING RESTRICTIONS No  FALLS: Has patient fallen in last 6 months?  No falls but has had one near miss while performing bed mobility   PATIENT GOALS "Mobility I guess" "Independence is my main thing"    OBJECTIVE:   TODAY'S TREATMENT:  GAIT: Gait pattern: step through pattern, decreased hip/knee flexion- Left, and decreased ankle dorsiflexion- Left Distance  walked: 575 ft Assistive device utilized: Walker - 2 wheeled Level of assistance: Modified independence Comments: Improved speed and gait mechanics noted with use of RW this date as well as improved endurance for gait.  Gait  pattern: step to pattern, decreased step length- Right, decreased step length- Left, decreased hip/knee flexion- Left, and decreased ankle dorsiflexion- Left Distance walked: 50 ft Assistive device utilized: None Level of assistance: CGA Comments: trial gait with no AD, pt exhibits decreased confidence with gait with no AD, decreased gait speed, and some hesitancy/guarded movements  Gait pattern: step through pattern, decreased step length- Left, decreased hip/knee flexion- Left, and decreased ankle dorsiflexion- Left Distance walked: 200 ft Assistive device utilized: Quad cane small base "hurricane" Level of assistance: SBA Comments: trial gait with SBQC, pt exhibits improved confidence and speed as compared to gait with no AD but decreased confidence and gait speed as compared to RW   THER ACT:  [facility] PT Assessment - 05/23/22 1119       Ambulation/Gait   Gait velocity 32.8 ft over 13.65 sec = 2.4 ft/sec      Standardized Balance Assessment   Standardized Balance Assessment Berg Balance Test;Five Times Sit to Stand    Five times sit to stand comments  18.63 sec   intermittent use of RW and/or hands on arms of chair     Berg Balance Test   Sit to Stand Able to stand  independently using hands    Standing Unsupported Able to stand safely 2 minutes    Sitting with Back Unsupported but Feet Supported on Floor or Stool Able to sit safely and securely 2 minutes    Stand to Sit Controls descent by using hands    Transfers Able to transfer safely, definite need of hands    Standing Unsupported with Eyes Closed Able to stand 10 seconds with supervision    Standing Unsupported with Feet Together Able to place feet together independently and stand for 1 minute with  supervision    From Standing, Reach Forward with Outstretched Arm Can reach forward >5 cm safely (2")    From Standing Position, Pick up Object from Floor Unable to try/needs assist to keep balance   LOB, min A to recover   From Standing Position, Turn to Look Behind Over each Shoulder Looks behind from both sides and weight shifts well    Turn 360 Degrees Able to turn 360 degrees safely but slowly    Standing Unsupported, Alternately Place Feet on Step/Stool Able to complete >2 steps/needs minimal assist    Standing Unsupported, One Foot in Front Able to take small step independently and hold 30 seconds    Standing on One Leg Tries to lift leg/unable to hold 3 seconds but remains standing independently    Total Score 35    Berg comment: 35/56, high fall risk              PATIENT EDUCATION: Education details: continue HEP, OM results and functional implications  [place] Person educated: Patient Education method: Explanation, Demonstration, and Handouts Education comprehension: verbalized understanding and needs further education  Access Code: [email or web address] URL: [email or web address] Date: 05/02/2022 Prepared by: Ethelene Browns  Exercises - Supine Bridge  - 1 x daily - 7 x weekly - 1 sets - 10 reps - 3-5 secs hold - Single Leg Bridge  - 1 x daily - 7 x weekly - 1 sets - 10 reps - 3 sec hold - Sidelying Hip Abduction  - 1 x daily - 7 x weekly - 1 sets - 10 reps - 2 sec hold - Clamshell  - 1 x daily - 7 x weekly - 3 sets - 10 reps - 3-5 sec hold hold -  Standing Hip Flexion with Counter Support  - 1 x daily - 7 x weekly - 1 sets - 10 reps - Standing Hip Extension with Counter Support  - 1 x daily - 7 x weekly - 1 sets - 10 reps - Standing Hip Abduction with Counter Support  - 1 x daily - 7 x weekly - 3 sets - 10 reps  GOALS: Goals reviewed with patient? Yes   SHORT TERM GOALS: Target date: 05/09/2022   Pt will perform initial HEP w/S* from daughter for  improved strength, balance, transfers and gait.  Baseline: not established on eval  Goal status: MET   2.  Pt will improve gait velocity to at least 2.2 ft/s with LRAD for improved gait efficiency and confidence with capabilities    Baseline: 1.82 ft/s w/RW, 2.08 ft/sec with RW (8/10), 2.15 ft/sec (8/15), 2.4 ft/sec (8/29) Goal status: MET   3.  Pt will improve normal TUG to less than or equal to 20 seconds w/LRAD for improved functional mobility and decreased fall risk.   Baseline: 25.84s w/RW, 25 sec with RW (8/10), 23 sec with RW (8/15) Goal status: IN PROGRESS    4.  Berg to be assessed and LTG written Baseline: 38/56 (04/21/22) Goal status: MET     LONG TERM GOALS: Target date: 05/23/2022   Pt will be independent with final HEP for improved strength, balance, transfers and gait.  Baseline:  Goal status: MET   2.  Pt will improve Berg score to >/= 46/56 to demonstrate decreased fall risk   Baseline: 38/56 (04/21/22), 35/56 (8/29) Goal status: IN PROGRESS   3.  Pt will ambulate >500' on level and unlevel surfaces w/LRAD mod I for return to community mobility and improved visual scanning  Baseline:  Goal status: MET   4.  Pt will improve 5 x STS to less than or equal to 15 seconds without UE support to demonstrate improved functional strength and transfer efficiency.    Baseline: 16.72s w/BUE support, 18.63 s with BUE support (8/29)  Goal status: IN PROGRESS   5.  Pt will improve gait velocity to at least 2.7 ft/s w/LRAD for improved gait efficiency and performance at community ambulator level    Baseline: 1.82 ft/s w/RW, 2.08 ft/sec with RW (8/10), 2.4 ft/sec (8/29) Goal status: IN PROGRESS   6. Pt and daughter will verbalize fall prevention strategies to be used in the home and community environment for reduced fall risk             Baseline:             Goal Status: IN PROGRESS  NEW LONG TERM GOALS: Target date: 06/21/2022     1.  Pt will improve Berg score to >/=  46/56 to demonstrate decreased fall risk  Baseline: 38/56 (04/21/22), 35/56 (8/29) Goal status: IN PROGRESS    2.  Pt will improve 5 x STS to less than or equal to 15 seconds without UE support to demonstrate improved functional strength and transfer efficiency.    Baseline: 16.72s w/BUE support, 18.63 s with BUE support (8/29)  Goal status: IN PROGRESS   3.  Pt will improve gait velocity to at least 2.7 ft/s w/LRAD for improved gait efficiency and performance at community ambulator level    Baseline: 1.82 ft/s w/RW, 2.08 ft/sec with RW (8/10), 2.4 ft/sec (8/29) Goal status: IN PROGRESS   4. Pt and daughter will verbalize fall prevention strategies to be used in the home and community  environment for reduced fall risk             Baseline:             Goal Status: IN PROGRESS  5. Pt will improve normal TUG to less than or equal to 20 seconds w/LRAD for improved functional mobility and decreased fall risk.  Baseline: 25.84s w/RW, 25 sec with RW (8/10), 23 sec with RW (8/15) Goal status: IN PROGRESS     ASSESSMENT:   CLINICAL IMPRESSION: Emphasis of skilled PT session on assessing LTG and working towards gait with decreased AD reliance. Pt has met 2/6 LTG due to being independent with her HEP and being able to ambulate x 500 ft (+) with her RW at mod I level. She exhibited decreased score on Berg from 38/56 to 35/56, indicating a higher fall risk and needed increased time to complete the 5xSTS test from 16.72 sec to 18.63 sec. She did improve her gait speed from 1.82 ft/sec initially to 2.4 ft/sec but did not quite reach goal of 2.7 ft/sec.  Pt exhibits decreased confidence level without UE support and is only able to ambulate short distances in the clinic without an AD despite reporting that she does occasionally try to ambulate in the home without an AD. Pt does exhibit good balance and safety with sue of SBQC/hurricane and will benefit from continued practice with this device. Pt will  also benefit from ongoing therapy services to address ongoing balance impairments and decrease her fall risk.  New LTG set due to still being within POC and at LTG end date. Plan to recertify patient next session for continued physical therapy sessions due to ongoing balance impairments. Continue POC.     OBJECTIVE IMPAIRMENTS decreased balance, decreased cognition, decreased coordination, decreased knowledge of condition, decreased knowledge of use of DME, difficulty walking, decreased safety awareness, impaired perceived functional ability, impaired sensation, and impaired vision/preception.    ACTIVITY LIMITATIONS lifting, bending, squatting, transfers, bed mobility, dressing, and caring for others   PARTICIPATION LIMITATIONS: meal prep, cleaning, laundry, medication management, personal finances, driving, shopping, community activity, and yard work   [place], Transportation, and 1 comorbidity: L inattention  are also affecting patient's functional outcome.    REHAB POTENTIAL: Good   CLINICAL DECISION MAKING: Stable/uncomplicated   EVALUATION COMPLEXITY: Low   PLAN: PT FREQUENCY: 2x/week   PT DURATION: 6 weeks   PLANNED INTERVENTIONS: Therapeutic exercises, Therapeutic activity, Neuromuscular re-education, Balance training, Gait training, Patient/Family education, Self Care, Joint mobilization, Visual/preceptual remediation/compensation, DME instructions, and Re-evaluation   PLAN FOR NEXT SESSION: recert next visit, work on L inattention, progress to gait w/reduced AD support ([place] with rubber quad tip)--pt to try to bring in her [facility] next visit, dynamic balance with decreased UE support, LLE step overs and hurdles, resisted step-taps      Excell Seltzer, PT, DPT, [facility] [address] [place], [place]  [address] Phone:  [phone] Fax:  [phone] 05/16/22 10:55am

## 2022-05-23 NOTE — Telephone Encounter (Signed)
Scheduled appointment per 8/28 los. Left voicemail for patient.

## 2022-05-23 NOTE — Therapy (Signed)
OUTPATIENT OCCUPATIONAL THERAPY NEURO TREATMENT  Patient Name: Tabitha Ramirez MRN: [id number] DOB:December 09, 1945, 76 y.o., female Today's Date: 05/23/2022  PCP: Rob Hickman primary care REFERRING PROVIDER: Dr. Naaman Plummer   OT End of Session - 05/23/22 1140     Visit Number 6    Number of Visits 25    Date for OT Re-Evaluation 07/04/22    Authorization Type [employer]    Authorization - Visit Number 2    Progress Note Due on Visit 10    OT Start Time 1017    OT Stop Time 1100    OT Time Calculation (min) 43 min    Activity Tolerance Patient tolerated treatment well    Behavior During Therapy WFL for tasks assessed/performed;Flat affect               Past Medical History:  Diagnosis Date   Allergy    Anemia    Arthritis    "joints" (09/29/2013)   Breast cancer ([place])    GERD (gastroesophageal reflux disease)    Heart murmur    History of blood transfusion    "related to chemo/breast cancer" (09/29/2013)   Hypertension    Migraines    Personal history of chemotherapy    Personal history of radiation therapy    Radiation 11/05/13-12/24/13   Right breast    TMJ (temporomandibular joint syndrome)    "left; just dx'd" (09/29/2013   Past Surgical History:  Procedure Laterality Date   AXILLARY LYMPH NODE BIOPSY Right 03/10/2013   Procedure: AXILLARY LYMPH NODE BIOPSY;  Surgeon: Adin Hector, MD;  Location: [facility];  Service: General;  Laterality: Right;  sentinel node with blue dye   BREAST BIOPSY     BREAST LUMPECTOMY Right 09/29/2013   needle localization w/axillary LND/notes 09/29/2013   BREAST LUMPECTOMY WITH NEEDLE LOCALIZATION AND AXILLARY LYMPH NODE DISSECTION Right 09/29/2013   Procedure: RIGHT BREAST NEEDLE LOCALIZED LUMPECTOMY AND AXILLARY LYMPH NODE DISSECTION;  Surgeon: Adin Hector, MD;  Location: [facility];  Service: General;  Laterality: Right;   COLONOSCOPY N/A 04/30/2013   Procedure: COLONOSCOPY;  Surgeon: Wonda Horner, MD;  [facility]: [facility];  Service: Endoscopy;  Laterality:  N/A;   CRANIOTOMY Right 03/11/2022   Procedure: CRANIOTOMY TUMOR EXCISION;  Surgeon: Meade Maw, MD;  Location: [facility];  Service: Neurosurgery;  Laterality: Right;   ESOPHAGOGASTRODUODENOSCOPY N/A 04/28/2013   Procedure: ESOPHAGOGASTRODUODENOSCOPY (EGD);  Surgeon: Wonda Horner, MD;  Location: [facility];  Service: Endoscopy;  Laterality: N/A;   MASTECTOMY Right 09/29/2013   "partial"   PORT-A-CATH REMOVAL  09/29/2013   PORT-A-CATH REMOVAL Left 09/29/2013   Procedure: REMOVAL PORT-A-CATH;  Surgeon: Adin Hector, MD;  Location: [facility];  Service: General;  Laterality: Left;   PORTACATH PLACEMENT N/A 03/10/2013   Procedure: INSERTION PORT-A-CATH WITH FLUOROSCOPY AND ULTRASOUND;  Surgeon: Adin Hector, MD;  Location: [facility];  Service: General;  Laterality: N/A;   SURAL NERVE BX Right 05/13/2014   Procedure: SURAL NERVE BIOPSY;  Surgeon: Hosie Spangle, MD;  Location: [facility];  Service: Neurosurgery;  Laterality: Right;  Right sural nerve biopsy   TONSILLECTOMY     TOTAL ABDOMINAL HYSTERECTOMY     With bilateral salpingo-oophorectomy   Patient Active Problem List   Diagnosis Date Noted   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) 03/17/2022   Palliative care encounter    Brain mass 03/09/2022   Seizure ([place]) 03/09/2022   Neuropathy 06/11/2014  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   Gait abnormality 01/26/2014   Unspecified hereditary and idiopathic peripheral neuropathy 01/26/2014   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[place]) 01/20/2014   Neuropathic pain 01/20/2014   Anxiety 01/20/2014   Unspecified deficiency anemia 01/20/2014   Hx of neutropenia 08/04/2013   Jaw pain 08/04/2013   Leukocytosis 04/27/2013   Pericardial effusion 04/27/2013   Cancer of central portion of right female breast ([place]) 02/04/2013   Need for influenza vaccination 11/29/2011   Hypertriglyceridemia 03/07/2011    Post-menopausal 03/07/2011   Anemia 12/17/2010   Heart murmur 12/17/2010   Seasonal allergies 12/17/2010   HYPOKALEMIA 02/19/2009   Essential hypertension, benign 04/09/2008    ONSET DATE: 03/11/22, craniotomy  REFERRING DIAG: D49.6 (ICD-10-CM) - Brain tumor ([place])  THERAPY DIAG:  Other lack of coordination  Muscle weakness (generalized)  Neurologic neglect syndrome  Other disturbances of skin sensation  Brain tumor ([facility])  Other abnormalities of gait and mobility  Rationale for Evaluation and Treatment Rehabilitation  SUBJECTIVE:   SUBJECTIVE STATEMENT:  Pt reports no changes recently stating that things are "the same". She does report some pain in her left breast and states that she plans to take medication at home "later" because she cannot take it before coming for therapy.  Pt accompanied by: self  PERTINENT HISTORY:  PMH significant for breast CA in remission for 7 years,  status post lobectomy, chemotherapy, radiation, hypertension, hypothyroidism, severe peripheral neuropathy status post IVIG, hx of seizure. 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 Pt was seen on CIR and d/c home 03/27/22  PRECAUTIONS: Fall  WEIGHT BEARING RESTRICTIONS No  PAIN:  Are you having pain? Yes, left breast. 5/10 Aggravating: unknown, "it's just there" Relieving factors: pain medicine and medication for neuropathy   PATIENT GOALS to be more independent  OBJECTIVE:   Per initial OT Assessment: HAND DOMINANCE: Right  HAND FUNCTION: Grip strength: Right: 36.5 lbs; Left: 41.0 lbs  COORDINATION: 9 Hole Peg test: Right: 1 min 43 secs  Left: 7pegs in 2 min   Box and Blocks:  Right 37 blocks, Left  20 blocks   VISION ASSESSMENT: Visual Fields: per chart left visual field deficit,  pt's dtr reports L neglect  PERCEPTION: Impaired: Inattention/neglect: does not attend to left side of body   TODAY'S TREATMENT:     Tabletop scanning activity/worksheets: "Cross out the number  4 in each row", pt got all numbers correct after Min v.c 's and instruction for task,  discussed scanning pattern left to right and turning head PRN to make sure she scanned to the far left. "Cross out all double numbers" in each row, pt got 69/69 correct with min vc's given increased time for task.  Discussed doing word searches at home and practicing scanning daily when reading or watching TV etc. Discussed examples verbally in clinic.  Small puzzle for scanning at tabletop with max difficulty and max v.c.'s required given increased time. Pt was able to get 2 puzzle pieces together overall only when they were side by side in the correct position w/ max v.c.'s and was noted to have maximal difficulty despite cues/strategies with a 12 piece puzzle. Pt was unable to find all straight edged pieces when cued and was unable to place them together. She should benefit from continued focus in this area.  Sit-stand min v.c for safe performance   PATIENT EDUCATION: Education details: visual scanning strategies Person educated: Patient Education method: Explanation, Demonstration, Verbal cues,  Education comprehension: verbalized understanding, needs  reinforcement, returned demonstration, mod-max v.c    HOME EXERCISE PROGRAM: 05/02/22: Visual scanning strategies 05/04/22: Coordination HEP 05/23/22: Continue with visual scanning activity at home, word finds and coordination HEP.  GOALS:   SHORT TERM GOALS: Target date:    I with initial HEP  Goal status: IN PROGRESS  2.  Pt will perform dressing with supervision only.  Goal status: INITIAL  3.  Pt will verbalize understanding of compensatory strategies for visual deficits/ left neglect and sensory deficits.  Goal status: IN PROGRESS  4.  Pt will demonstrate improved bilateral UE functional use as evidenced by increasing box/ blocks score by 5 blocks bilaterally. Baseline: Box and Blocks:  Right 37 blocks, Left  20 blocks Goal status:  INITIAL  5.  Pt will perform functional tabletop scanning activities with 90% or better accuracy  Goal status: IN PROGRESS  6.  Improve grip strength Rt hand by 5 lbs Baseline: Rt = 36.5 lbs, Lt = 41.0 LBS Goal status: INITIAL  LONG TERM GOALS: Target date: 07/04/22  I with updated HEP.  Goal status: INITIAL  2.  Pt will perform all basic ADLs mod I   Goal status: INITIAL  3.  Pt will perform basic home management with supervision demonstrating good safety awareness.  Goal status: INITIAL  4.  Pt will perform environmental scanning in a mod distracting environment with 90% accuracy and no LOB.  Goal status: INITIAL  5.  Pt will perform basic cooking task with min A demonstrating good safety awareness  Goal status: INITIAL  6.  Pt will demonstrate improved LUE fine motor coordination as evidenced by placing 9 pegs in 2 mins. Baseline: 7 pegs in 2 mins Goal status: INITIAL  7. Pt  will demonstrate improved fine motor coordination as evidenced by decreasing 9 hole peg test score to 90 secs or less RUE  Baseline: 1 min 43 secs initial   Goal status: initial  ASSESSMENT:  CLINICAL IMPRESSION:  Pt continues to experience difficulty with visual scanning. She was unable to complete a 12 piece puzzle this date despite max v.c.'s, increased time and strategies in the clinic setting. She is progressing slowly overall towards [facility] and goals related to OT. She continues to require v.c for organized scan pattern and scanning to left.  PERFORMANCE DEFICITS in functional skills including ADLs, IADLs, coordination, dexterity, proprioception, sensation, strength, FMC, GMC, mobility, balance, endurance, decreased knowledge of precautions, decreased knowledge of use of DME, vision, and UE functional use, cognitive skills including attention, learn, memory, perception, problem solving, safety awareness, sequencing, thought, and understand, and psychosocial skills including coping strategies,  environmental adaptation, habits, interpersonal interactions, and routines and behaviors.   IMPAIRMENTS are limiting patient from ADLs, IADLs, play, leisure, and social participation.   COMORBIDITIES may have co-morbidities  that affects occupational performance. Patient will benefit from skilled OT to address above impairments and improve overall function.  MODIFICATION OR ASSISTANCE TO COMPLETE EVALUATION: No modification of tasks or assist necessary to complete an evaluation.  OT OCCUPATIONAL PROFILE AND HISTORY: Detailed assessment: Review of records and additional review of physical, cognitive, psychosocial history related to current functional performance.  CLINICAL DECISION MAKING: LOW - limited treatment options, no task modification necessary  REHAB POTENTIAL: Good  EVALUATION COMPLEXITY: Low    PLAN: OT FREQUENCY: 2x/week  OT DURATION: 12 weeks plus eval  PLANNED INTERVENTIONS: self care/ADL training, therapeutic exercise, therapeutic activity, neuromuscular re-education, manual therapy, passive range of motion, gait training, balance training, functional mobility training, aquatic therapy, fluidotherapy,  moist heat, cryotherapy, patient/family education, cognitive remediation/compensation, visual/perceptual remediation/compensation, energy conservation, coping strategies training, DME and/or AE instructions, and Re-evaluation  RECOMMENDED OTHER SERVICES: PT, ST  CONSULTED AND AGREED WITH PLAN OF CARE: Patient and family member/caregiver  PLAN FOR NEXT SESSION:   LUE functional use, NMR and/or ADL training for UB dressing.   Percell Miller [place], [place] 05/23/2022, 12:03 PM 12:03 PM 05/23/22

## 2022-05-25 ENCOUNTER — Ambulatory Visit: Payer: Medicare PPO | Admitting: Occupational Therapy

## 2022-05-25 ENCOUNTER — Encounter: Payer: Medicare PPO | Admitting: Speech Pathology

## 2022-05-25 ENCOUNTER — Ambulatory Visit: Payer: Medicare PPO | Admitting: Physical Therapy

## 2022-05-29 NOTE — Therapy (Signed)
OUTPATIENT OCCUPATIONAL THERAPY NEURO TREATMENT  Patient Name: Tabitha Ramirez MRN: [id number] DOB:24-Aug-1946, 76 y.o., female Today's Date: 05/30/2022  PCP: Rob Hickman primary care REFERRING PROVIDER: Dr. Naaman Plummer   OT End of Session - 05/30/22 0938     Visit Number 7    Number of Visits 25    Date for OT Re-Evaluation 07/04/22    Authorization Type [employer]    Authorization - Visit Number 2    Progress Note Due on Visit 10    OT Start Time [phone]    OT Stop Time 1015    OT Time Calculation (min) 41 min    Activity Tolerance Patient tolerated treatment well    Behavior During Therapy [facility] for tasks assessed/performed;Flat affect                Past Medical History:  Diagnosis Date   Allergy    Anemia    Arthritis    "joints" (09/29/2013)   Breast cancer ([place])    GERD (gastroesophageal reflux disease)    Heart murmur    History of blood transfusion    "related to chemo/breast cancer" (09/29/2013)   Hypertension    Migraines    Personal history of chemotherapy    Personal history of radiation therapy    Radiation 11/05/13-12/24/13   Right breast    TMJ (temporomandibular joint syndrome)    "left; just dx'd" (09/29/2013   Past Surgical History:  Procedure Laterality Date   AXILLARY LYMPH NODE BIOPSY Right 03/10/2013   Procedure: AXILLARY LYMPH NODE BIOPSY;  Surgeon: Adin Hector, MD;  Location: [facility];  Service: General;  Laterality: Right;  sentinel node with blue dye   BREAST BIOPSY     BREAST LUMPECTOMY Right 09/29/2013   needle localization w/axillary LND/notes 09/29/2013   BREAST LUMPECTOMY WITH NEEDLE LOCALIZATION AND AXILLARY LYMPH NODE DISSECTION Right 09/29/2013   Procedure: RIGHT BREAST NEEDLE LOCALIZED LUMPECTOMY AND AXILLARY LYMPH NODE DISSECTION;  Surgeon: Adin Hector, MD;  Location: [facility];  Service: General;  Laterality: Right;   COLONOSCOPY N/A 04/30/2013   Procedure: COLONOSCOPY;  Surgeon: Wonda Horner, MD;  Location: [facility];  Service: Endoscopy;  Laterality:  N/A;   CRANIOTOMY Right 03/11/2022   Procedure: CRANIOTOMY TUMOR EXCISION;  Surgeon: Meade Maw, MD;  Location: [facility];  Service: Neurosurgery;  Laterality: Right;   ESOPHAGOGASTRODUODENOSCOPY N/A 04/28/2013   Procedure: ESOPHAGOGASTRODUODENOSCOPY (EGD);  Surgeon: Wonda Horner, MD;  Location: [facility];  Service: Endoscopy;  Laterality: N/A;   MASTECTOMY Right 09/29/2013   "partial"   PORT-A-CATH REMOVAL  09/29/2013   PORT-A-CATH REMOVAL Left 09/29/2013   Procedure: REMOVAL PORT-A-CATH;  Surgeon: Adin Hector, MD;  Location: [facility];  Service: General;  Laterality: Left;   PORTACATH PLACEMENT N/A 03/10/2013   Procedure: INSERTION PORT-A-CATH WITH FLUOROSCOPY AND ULTRASOUND;  Surgeon: Adin Hector, MD;  Location: [facility];  Service: General;  Laterality: N/A;   SURAL NERVE BX Right 05/13/2014   Procedure: SURAL NERVE BIOPSY;  Surgeon: Hosie Spangle, MD;  Location: [facility];  Service: Neurosurgery;  Laterality: Right;  Right sural nerve biopsy   TONSILLECTOMY     TOTAL ABDOMINAL HYSTERECTOMY     With bilateral salpingo-oophorectomy   Patient Active Problem List   Diagnosis Date Noted   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, right ([place]) 04/13/2022   Brain tumor ([place]) 03/17/2022   Palliative care encounter    Brain mass 03/09/2022   Seizure ([place]) 03/09/2022   Neuropathy 06/11/2014  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   Gait abnormality 01/26/2014   Unspecified hereditary and idiopathic peripheral neuropathy 01/26/2014   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[place]) 01/20/2014   Neuropathic pain 01/20/2014   Anxiety 01/20/2014   Unspecified deficiency anemia 01/20/2014   Hx of neutropenia 08/04/2013   Jaw pain 08/04/2013   Leukocytosis 04/27/2013   Pericardial effusion 04/27/2013   Cancer of central portion of right female breast ([place]) 02/04/2013   Need for influenza vaccination 11/29/2011   Hypertriglyceridemia 03/07/2011    Post-menopausal 03/07/2011   Anemia 12/17/2010   Heart murmur 12/17/2010   Seasonal allergies 12/17/2010   HYPOKALEMIA 02/19/2009   Essential hypertension, benign 04/09/2008    ONSET DATE: 03/11/22, craniotomy  REFERRING DIAG: D49.6 (ICD-10-CM) - Brain tumor ([place])  THERAPY DIAG:  Other lack of coordination  Neurologic neglect syndrome  Muscle weakness (generalized)  Other disturbances of skin sensation  Unsteadiness on feet  Rationale for Evaluation and Treatment Rehabilitation  SUBJECTIVE:   SUBJECTIVE STATEMENT:  Pt reports that oncologist put her on a new medication last week and that it has "zonked her out" and made her sleep all the time so she hasn't done much at home.  Dtr had to go to a meeting, but notified OT at beginning of session that medication was Cymbalta and that they have discontinued it now, but she is very weak now after sleeping so much.   Pt accompanied by: self  PERTINENT HISTORY:  PMH significant for breast CA in remission for 7 years,  status post lobectomy, chemotherapy, radiation, hypertension, hypothyroidism, severe peripheral neuropathy status post IVIG, hx of seizure. 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 Pt was seen on CIR and d/c home 03/27/22  PRECAUTIONS: Fall  WEIGHT BEARING RESTRICTIONS No  PAIN:  Are you having pain? No   PATIENT GOALS to be more independent  OBJECTIVE:   Per initial OT Assessment: HAND DOMINANCE: Right  HAND FUNCTION: Grip strength: Right: 36.5 lbs; Left: 41.0 lbs  COORDINATION: 9 Hole Peg test: Right: 1 min 43 secs  Left: 7pegs in 2 min   Box and Blocks:  Right 37 blocks, Left  20 blocks   VISION ASSESSMENT: Visual Fields: per chart left visual field deficit,  pt's dtr reports L neglect  PERCEPTION: Impaired: Inattention/neglect: does not attend to left side of body   TODAY'S TREATMENT:     Visual scanning for cards on tabletop with good accuracy/incr time.  Placing medium pegs in pegboard  to copy design for incr coordination with LUE with mod-max difficulty with coordination and mod cueing for copying simple design accurately.  Removing with min cueing for L hand use.  Incr time needed overall for tasks.    Began checking STGs (box and blocks test and grip strength)--see goal section below.    HOME EXERCISE PROGRAM: 05/02/22: Visual scanning strategies 05/04/22: Coordination HEP 05/23/22: Continue with visual scanning activity at home, word finds and coordination HEP.   GOALS:   SHORT TERM GOALS: Target date:  06/04/22  I with initial HEP Goal status: IN PROGRESS  2.  Pt will perform dressing with supervision only Goal status: INITIAL  3.  Pt will verbalize understanding of compensatory strategies for visual deficits/ left neglect and sensory deficits. Goal status: IN PROGRESS  4.  Pt will demonstrate improved bilateral UE functional use as evidenced by increasing box/ blocks score by 5 blocks bilaterally. Baseline: Box and Blocks:  Right 37 blocks, Left  20 blocks Goal status: IN PROGRESS  05/30/22:  R-29 blocks, L-blocks  5.  Pt will perform functional tabletop scanning activities with 90% or better accuracy Goal status: IN PROGRESS  6.  Improve grip strength Rt hand by 5 lbs Baseline: Rt = 36.5 lbs, Lt = 41.0 LBS Goal status: IN PROGRESS  05/30/22:  R-36.8lbs, L-37.2lbs    LONG TERM GOALS: Target date: 07/04/22  I with updated HEP. Goal status: INITIAL  2.  Pt will perform all basic ADLs mod I Goal status: INITIAL  3.  Pt will perform basic home management with supervision demonstrating good safety awareness. Goal status: INITIAL  4.  Pt will perform environmental scanning in a mod distracting environment with 90% accuracy and no LOB. Goal status: INITIAL  5.  Pt will perform basic cooking task with min A demonstrating good safety awareness  Goal status: INITIAL  6.  Pt will demonstrate improved LUE fine motor coordination as evidenced by placing 9  pegs in 2 mins. Baseline: 7 pegs in 2 mins Goal status: INITIAL  7. Pt  will demonstrate improved fine motor coordination as evidenced by decreasing 9 hole peg test score to 90 secs or less RUE  Baseline: 1 min 43 secs initial   Goal status: initial  ASSESSMENT:  CLINICAL IMPRESSION:  Pt is progressing slowly towards goals.  She continues to demo difficulty with LUE functional use/initiation, but has not been able to do as much at home due to medication change making her sleepy.   PERFORMANCE DEFICITS in functional skills including ADLs, IADLs, coordination, dexterity, proprioception, sensation, strength, FMC, GMC, mobility, balance, endurance, decreased knowledge of precautions, decreased knowledge of use of DME, vision, and UE functional use, cognitive skills including attention, learn, memory, perception, problem solving, safety awareness, sequencing, thought, and understand, and psychosocial skills including coping strategies, environmental adaptation, habits, interpersonal interactions, and routines and behaviors.   IMPAIRMENTS are limiting patient from ADLs, IADLs, play, leisure, and social participation.   COMORBIDITIES may have co-morbidities  that affects occupational performance. Patient will benefit from skilled OT to address above impairments and improve overall function.  MODIFICATION OR ASSISTANCE TO COMPLETE EVALUATION: No modification of tasks or assist necessary to complete an evaluation.  OT OCCUPATIONAL PROFILE AND HISTORY: Detailed assessment: Review of records and additional review of physical, cognitive, psychosocial history related to current functional performance.  CLINICAL DECISION MAKING: LOW - limited treatment options, no task modification necessary  REHAB POTENTIAL: Good  EVALUATION COMPLEXITY: Low   PLAN: OT FREQUENCY: 2x/week  OT DURATION: 12 weeks plus eval  PLANNED INTERVENTIONS: self care/ADL training, therapeutic exercise, therapeutic activity,  neuromuscular re-education, manual therapy, passive range of motion, gait training, balance training, functional mobility training, aquatic therapy, fluidotherapy, moist heat, cryotherapy, patient/family education, cognitive remediation/compensation, visual/perceptual remediation/compensation, energy conservation, coping strategies training, DME and/or AE instructions, and Re-evaluation  RECOMMENDED OTHER SERVICES: PT, ST  CONSULTED AND AGREED WITH PLAN OF CARE: Patient and family member/caregiver  PLAN FOR NEXT SESSION:   Continue checking STGs;  LUE functional use, NMR and/or ADL training for UB dressing.   Oleta Gunnoe, OTR/L 05/30/2022, 10:00 AM 10:00 AM 05/30/22

## 2022-05-30 ENCOUNTER — Ambulatory Visit: Payer: Medicare PPO | Admitting: Physical Therapy

## 2022-05-30 ENCOUNTER — Encounter: Payer: Self-pay | Admitting: Occupational Therapy

## 2022-05-30 ENCOUNTER — Ambulatory Visit: Payer: Medicare PPO | Attending: Physician Assistant | Admitting: Occupational Therapy

## 2022-05-30 ENCOUNTER — Encounter: Payer: Self-pay | Admitting: Urology

## 2022-05-30 ENCOUNTER — Telehealth: Payer: Self-pay | Admitting: *Deleted

## 2022-05-30 ENCOUNTER — Encounter: Payer: Medicare PPO | Admitting: Speech Pathology

## 2022-05-30 VITALS — BP 116/69 | HR 81

## 2022-05-30 DIAGNOSIS — R2681 Unsteadiness on feet: Secondary | ICD-10-CM | POA: Insufficient documentation

## 2022-05-30 DIAGNOSIS — M6281 Muscle weakness (generalized): Secondary | ICD-10-CM

## 2022-05-30 DIAGNOSIS — R414 Neurologic neglect syndrome: Secondary | ICD-10-CM | POA: Insufficient documentation

## 2022-05-30 DIAGNOSIS — R278 Other lack of coordination: Secondary | ICD-10-CM | POA: Insufficient documentation

## 2022-05-30 DIAGNOSIS — R208 Other disturbances of skin sensation: Secondary | ICD-10-CM | POA: Insufficient documentation

## 2022-05-30 DIAGNOSIS — R2689 Other abnormalities of gait and mobility: Secondary | ICD-10-CM | POA: Insufficient documentation

## 2022-05-30 NOTE — Telephone Encounter (Signed)
Returned patient's daughter's phone call [place] -Noelle Penner), spoke with patient's daughter

## 2022-05-30 NOTE — Progress Notes (Signed)
Telephone appointment. I spoke w/ patient's daughter Ms. Reggy Eye, verified her identity and began nursing interview. She reports that patient is experiencing some RT breast pain and headaches 7/10. No other issues reported at this time.  Meaningful use complete.  Reminded patient's Ms. Hylton of patient's 11:30am-05/31/22 telephone appointment w/ Ashlyn Bruning PA-C. I left my extension [phone] in case patient needs anything. Ms. Noelle Penner verbalized understanding.  Patient contact  [phone]- Ms. Reggy Eye (daughter).

## 2022-05-30 NOTE — Therapy (Signed)
OUTPATIENT PHYSICAL THERAPY NEURO TREATMENT- RECERTIFICATION   Patient Name: Tabitha Ramirez MRN: [id number] DOB:[birth year], 76 y.o., female Today's Date: 05/30/2022   PCP: [facility]  REFERRING PROVIDER: Cathlyn Parsons, PA-C    PT End of Session - 05/30/22 1023     Visit Number 8    Number of Visits 21   Recert   Date for PT Re-Evaluation 88/91/69   Recert   Authorization Type [employer]    Progress Note Due on Visit 10    PT Start Time 1019   Handoff w/OT   PT Stop Time 4503   Session ended early due to pt lethargy   PT Time Calculation (min) 36 min    Activity Tolerance Patient limited by lethargy;Patient limited by fatigue    Behavior During Therapy [facility] for tasks assessed/performed;Flat affect                    Past Medical History:  Diagnosis Date   Allergy    Anemia    Arthritis    "joints" (09/29/2013)   Breast cancer ([place])    GERD (gastroesophageal reflux disease)    Heart murmur    History of blood transfusion    "related to chemo/breast cancer" (09/29/2013)   Hypertension    Migraines    Personal history of chemotherapy    Personal history of radiation therapy    Radiation 11/05/13-12/24/13   Right breast    TMJ (temporomandibular joint syndrome)    "left; just dx'd" (09/29/2013   Past Surgical History:  Procedure Laterality Date   AXILLARY LYMPH NODE BIOPSY Right 03/10/2013   Procedure: AXILLARY LYMPH NODE BIOPSY;  Surgeon: Adin Hector, MD;  Location: [place];  Service: General;  Laterality: Right;  sentinel node with blue dye   BREAST BIOPSY     BREAST LUMPECTOMY Right 09/29/2013   needle localization w/axillary LND/notes 09/29/2013   BREAST LUMPECTOMY WITH NEEDLE LOCALIZATION AND AXILLARY LYMPH NODE DISSECTION Right 09/29/2013   Procedure: RIGHT BREAST NEEDLE LOCALIZED LUMPECTOMY AND AXILLARY LYMPH NODE DISSECTION;  Surgeon: Adin Hector, MD;  Location: [place];  Service: General;  Laterality: Right;   COLONOSCOPY N/A  04/30/2013   Procedure: COLONOSCOPY;  Surgeon: Wonda Horner, MD;  Location: [facility];  Service: Endoscopy;  Laterality: N/A;   CRANIOTOMY Right 03/11/2022   Procedure: CRANIOTOMY TUMOR EXCISION;  Surgeon: Meade Maw, MD;  Location: [facility];  Service: Neurosurgery;  Laterality: Right;   ESOPHAGOGASTRODUODENOSCOPY N/A 04/28/2013   Procedure: ESOPHAGOGASTRODUODENOSCOPY (EGD);  Surgeon: Wonda Horner, MD;  Location: [facility];  Service: Endoscopy;  Laterality: N/A;   MASTECTOMY Right 09/29/2013   "partial"   PORT-A-CATH REMOVAL  09/29/2013   PORT-A-CATH REMOVAL Left 09/29/2013   Procedure: REMOVAL PORT-A-CATH;  Surgeon: Adin Hector, MD;  Location: [place];  Service: General;  Laterality: Left;   PORTACATH PLACEMENT N/A 03/10/2013   Procedure: INSERTION PORT-A-CATH WITH FLUOROSCOPY AND ULTRASOUND;  Surgeon: Adin Hector, MD;  Location: [place];  Service: General;  Laterality: N/A;   SURAL NERVE BX Right 05/13/2014   Procedure: SURAL NERVE BIOPSY;  Surgeon: Hosie Spangle, MD;  Location: [facility];  Service: Neurosurgery;  Laterality: Right;  Right sural nerve biopsy   TONSILLECTOMY     TOTAL ABDOMINAL HYSTERECTOMY     With bilateral salpingo-oophorectomy   Patient Active Problem List   Diagnosis Date Noted   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, right ([place]) 04/13/2022   Brain tumor (  [place]) 03/17/2022   Palliative care encounter    Brain mass 03/09/2022   Seizure ([place]) 03/09/2022   Neuropathy 06/11/2014   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   Gait abnormality 01/26/2014   Unspecified hereditary and idiopathic peripheral neuropathy 01/26/2014   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[place]) 01/20/2014   Neuropathic pain 01/20/2014   Anxiety 01/20/2014   Unspecified deficiency anemia 01/20/2014   Hx of neutropenia 08/04/2013   Jaw pain 08/04/2013   Leukocytosis 04/27/2013   Pericardial effusion 04/27/2013   Cancer of central portion of right  female breast ([place]) 02/04/2013   Need for influenza vaccination 11/29/2011   Hypertriglyceridemia 03/07/2011   Post-menopausal 03/07/2011   Anemia 12/17/2010   Heart murmur 12/17/2010   Seasonal allergies 12/17/2010   HYPOKALEMIA 02/19/2009   Essential hypertension, benign 04/09/2008    ONSET DATE: 03/21/2022 (referral)  REFERRING DIAG: D49.6 (ICD-10-CM) - Brain tumor ([place])   THERAPY DIAG:  Muscle weakness (generalized)  Unsteadiness on feet  Other abnormalities of gait and mobility  Rationale for Evaluation and Treatment Rehabilitation  SUBJECTIVE:                                                                                                                                                                                             SUBJECTIVE STATEMENT: Pt reports she has felt like "a zombie" recently after starting new medication. Pt reports it caused her to miss her PT appointment last week. Pt not feeling up to participating in therapy today but daughter will not be back until 11, "so I guess I will stay". Pt hypophonic today.   Pt accompanied by: self  PERTINENT HISTORY: hypertension, hypothyroidism, tobacco use, peripheral neuropathy followed by Dr. 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 Neuro biopsy 04/2014 showed moderate severe loss of myelinated fiber.   PAIN:  Are you having pain? Yes: NPRS scale: 5/10 Pain location: Head Pain description: throbbing/achy   PRECAUTIONS: Fall  VITALS Vitals:   05/30/22 1024  BP: 116/69  Pulse: 81    WEIGHT BEARING RESTRICTIONS No  FALLS: Has patient fallen in last 6 months?  No falls but has had one near miss while performing bed mobility   PATIENT GOALS "Mobility I guess" "Independence is my main thing"    OBJECTIVE:   TODAY'S TREATMENT:   Ther Ex   SciFit multi-peaks level 3 for 8 minutes using BUE/BLEs for neural priming for reciprocal movement, dynamic cardiovascular warmup and  increased amplitude of stepping. Min cues for increased knee extension of LLE throughout. Pt moving very slowly. RPE of 7/10 following activity   NMR  In // bars  for improved BLE coordination, increased step length and single leg stability:  -Fwd 4" hurdle navigation w/BUE support x2 reps down and back (alt leading leg) and x1 rep down and back w/RUE support only. Pt required max concurrent cues throughout for proper sequencing and attention to L side  -lateral 4" hurdle navigation w/BUE support, x1 each direction. Noted pt unable to perform abduction of LLE and turned to face hurdles to perform anteriorly rather than laterally, requiring mod tactile cues to pelvis for proper form, as pt unable to comprehend verbal cues. Pt misidentified chair as a hurdle and attempted to step over it, requiring max multimodal cues for pt to realize her mistake. Pt required lengthy seated rest break prior to trying task again and on second set, was able to perform w/improved technique w/use of visual cues ("keep your toes pointing to me").   Pt requested to end session 5 minutes early due to fatigue.   Gait pattern: step through pattern, decreased stride length, decreased hip/knee flexion- Right, decreased hip/knee flexion- Left, ataxic, narrow BOS, and poor foot clearance- Left Distance walked: Various clinic distances  Assistive device utilized: Walker - 2 wheeled Level of assistance: SBA Comments: Pt escorted out of gym following session due to increased lethargy and confusion     PATIENT EDUCATION: Education details: continue HEP Medbridge  [id number] Person educated: Patient Education method: Explanation, Demonstration, and Handouts Education comprehension: verbalized understanding and needs further education  Access Code: [email or web address] URL: [email or web address] Date: 05/02/2022 Prepared by: Ethelene Browns  Exercises - Supine Bridge  - 1 x daily - 7 x weekly - 1 sets - 10 reps - 3-5 secs  hold - Single Leg Bridge  - 1 x daily - 7 x weekly - 1 sets - 10 reps - 3 sec hold - Sidelying Hip Abduction  - 1 x daily - 7 x weekly - 1 sets - 10 reps - 2 sec hold - Clamshell  - 1 x daily - 7 x weekly - 3 sets - 10 reps - 3-5 sec hold hold - Standing Hip Flexion with Counter Support  - 1 x daily - 7 x weekly - 1 sets - 10 reps - Standing Hip Extension with Counter Support  - 1 x daily - 7 x weekly - 1 sets - 10 reps - Standing Hip Abduction with Counter Support  - 1 x daily - 7 x weekly - 3 sets - 10 reps  GOALS: Goals reviewed with patient? Yes     OLD LONG TERM GOALS: Target date: 05/23/2022   Pt will be independent with final HEP for improved strength, balance, transfers and gait.  Baseline:  Goal status: MET   2.  Pt will improve Berg score to >/= 46/56 to demonstrate decreased fall risk   Baseline: 38/56 (04/21/22), 35/56 (8/29) Goal status: IN PROGRESS   3.  Pt will ambulate >500' on level and unlevel surfaces w/LRAD mod I for return to community mobility and improved visual scanning  Baseline:  Goal status: MET   4.  Pt will improve 5 x STS to less than or equal to 15 seconds without UE support to demonstrate improved functional strength and transfer efficiency.    Baseline: 16.72s w/BUE support, 18.63 s with BUE support (8/29)  Goal status: IN PROGRESS   5.  Pt will improve gait velocity to at least 2.7 ft/s w/LRAD for improved gait efficiency and performance at community ambulator level    Baseline: 1.82 ft/s w/RW, 2.08 ft/sec with RW (  8/10), 2.4 ft/sec (8/29) Goal status: IN PROGRESS   6. Pt and daughter will verbalize fall prevention strategies to be used in the home and community environment for reduced fall risk             Baseline:             Goal Status: IN PROGRESS  NEW LONG TERM GOALS FOR UPDATED POC: Target date: 06/27/2022     1.  Pt will improve Berg score to >/= 46/56 to demonstrate decreased fall risk  Baseline: 38/56 (04/21/22), 35/56 (8/29) Goal  status: IN PROGRESS    2.  Pt will improve 5 x STS to less than or equal to 15 seconds without UE support to demonstrate improved functional strength and transfer efficiency.    Baseline: 16.72s w/BUE support, 18.63 s with BUE support (8/29)  Goal status: IN PROGRESS   3.  Pt will improve gait velocity to at least 2.7 ft/s w/LRAD for improved gait efficiency and performance at community ambulator level    Baseline: 1.82 ft/s w/RW, 2.08 ft/sec with RW (8/10), 2.4 ft/sec (8/29) Goal status: IN PROGRESS   4. Pt and daughter will verbalize fall prevention strategies to be used in the home and community environment for reduced fall risk             Baseline:             Goal Status: IN PROGRESS  5. Pt will improve normal TUG to less than or equal to 20 seconds w/LRAD for improved functional mobility and decreased fall risk.  Baseline: 25.84s w/RW, 25 sec with RW (8/10), 23 sec with RW (8/15) Goal status: IN PROGRESS     ASSESSMENT:   CLINICAL IMPRESSION: Session limited due to significant pt lethargy and confusion. Pt reported she "became a zombie" w/new medication last week, stopped taking it 2 days ago but still feels its effect. Pt required mod-max multimodal cues throughout session for correct sequencing and safety w/tasks. Pt will benefit for 4 more weeks of PT at 2x/week for continued functional mobility gains and safety at home. Continue POC.     OBJECTIVE IMPAIRMENTS decreased balance, decreased cognition, decreased coordination, decreased knowledge of condition, decreased knowledge of use of DME, difficulty walking, decreased safety awareness, impaired perceived functional ability, impaired sensation, and impaired vision/preception.    ACTIVITY LIMITATIONS lifting, bending, squatting, transfers, bed mobility, dressing, and caring for others   PARTICIPATION LIMITATIONS: meal prep, cleaning, laundry, medication management, personal finances, driving, shopping, community activity,  and yard work   [place], Transportation, and 1 comorbidity: L inattention  are also affecting patient's functional outcome.    REHAB POTENTIAL: Good   CLINICAL DECISION MAKING: Stable/uncomplicated   EVALUATION COMPLEXITY: Low   PLAN: PT FREQUENCY: 2x/week   PT DURATION: 6 weeks   PLANNED INTERVENTIONS: Therapeutic exercises, Therapeutic activity, Neuromuscular re-education, Balance training, Gait training, Patient/Family education, Self Care, Joint mobilization, Visual/preceptual remediation/compensation, DME instructions, and Re-evaluation   PLAN FOR NEXT SESSION: work on L inattention, progress to gait w/reduced AD support ([place] with rubber quad tip)--pt to try to bring in her [facility] next visit, dynamic balance with decreased UE support, LLE step overs and hurdles, resisted step-taps     Charlett Nose, PT, [place] [address] [place], [place]  [address] Phone:  [phone] Fax:  [phone] 05/30/22      11:02am

## 2022-05-31 ENCOUNTER — Ambulatory Visit
Admission: RE | Admit: 2022-05-31 | Discharge: 2022-05-31 | Disposition: A | Discharge status: Home / Self Care | Payer: Medicare PPO | Source: Ambulatory Visit | Attending: Urology | Admitting: Urology

## 2022-05-31 DIAGNOSIS — C50911 Malignant neoplasm of unspecified site of right female breast: Secondary | ICD-10-CM

## 2022-05-31 NOTE — Progress Notes (Signed)
Radiation Oncology         [phone] ________________________________  Name: Tabitha Ramirez MRN: [id number]  Date: 05/31/2022  DOB: [birth year]  Post Treatment Note  CC: Tabitha Ramirez, [facility], [place] [facility]  Diagnosis:   76 yo woman s/p resection of a solitary brain metastasis secondary to triple negative breast cancer.     Interval Since Last Radiation:  4 weeks  04/27/22 - 05/03/22:  Post-op fractionated SRS to the Right parietal resection cavity to 27 Gy in 3 fractions of 9 Gy each.   11/05/13 - 12/12/13:   Right breast / 45 Gray @ 1.8 Tabitha Ramirez per fraction x 25 fractions Tabitha Ramirez) Right supraclavicular fossa / 45 Gy '@1'$ .8 Gy per fraction x 25 fractions Tabitha Ramirez)  12/15/13 - 12/24/13: Right breast boost / 16 Gray at 2 Tabitha Ramirez per fraction x 8 fractions Tabitha Ramirez)  Narrative:  I spoke with the patient to conduct her routine scheduled 1 month follow up visit via telephone to spare the patient unnecessary potential exposure in the healthcare setting during the current COVID-19 pandemic.  The patient was notified in advance and gave permission to proceed with this visit format.  She tolerated radiation treatment relatively well without any ill side effects.                              On review of systems, the patient states that she is doing fairly well in general.  She specifically denies persistent or frequent headaches, nausea, vomiting, changes in visual or auditory acuity, new focal weakness, imbalance/dizziness, tremor or seizure activity.  She is gradually recovering from extreme fatigue associated with Cymbalta which was recently started for her ongoing neuropathy that was previously refractory to Neurontin.  Unfortunately, within 48 hours of starting the medication, she was extremely somnolent and unable to get up and around on her own.  She has now been off the medication for approximately 72 hours and her daughter, Tabitha Ramirez, reports that she is noticing gradual  improvement in her alertness and ability to stay awake.  Overall, they are pleased with her progress to date.  ALLERGIES:  is allergic to nsaids and penicillins.  Meds: Current Outpatient Medications  Medication Sig Dispense Refill   acetaminophen (TYLENOL) 325 MG tablet Take 1-2 tablets (325-650 mg total) by mouth every 4 (four) hours as needed for mild pain.     anastrozole (ARIMIDEX) 1 MG tablet Take 1 tablet (1 mg total) by mouth daily. 90 tablet 3   Cholecalciferol (D3-1000) 25 MCG (1000 UT) capsule Take 1 capsule (1,000 Units total) by mouth daily. 30 capsule 0   DULoxetine (CYMBALTA) 30 MG capsule Take 1 capsule (30 mg total) by mouth daily. 30 capsule 0   ferrous sulfate 325 (65 FE) MG tablet Take 1 tablet (325 mg total) by mouth daily with breakfast. 30 tablet 3   levETIRAcetam (KEPPRA) 500 MG tablet Take 1 tablet (500 mg total) by mouth 2 (two) times daily. 120 tablet 0   levothyroxine (SYNTHROID) 50 MCG tablet Take 1 tablet (50 mcg total) by mouth daily. 30 tablet 0   LORazepam (ATIVAN) 0.5 MG tablet Take 1 tablet (0.5 mg total) by mouth as needed for anxiety (take 1 tablet 30 minutes prior to MRI scans and radiation visits, may repeat once, just prior to procedures if needed). 30 tablet 0   oxyCODONE-acetaminophen (PERCOCET/ROXICET) 5-325 MG tablet Take 1 tablet by mouth every 8 (eight) hours as needed  for severe pain. 60 tablet 0   potassium chloride (KLOR-CON) 10 MEQ tablet Take 10 mEq by mouth daily. (Patient not taking: Reported on 04/21/2022)     rosuvastatin (CRESTOR) 10 MG tablet Take 1 tablet (10 mg total) by mouth daily. 30 tablet 0   vitamin B-12 (CYANOCOBALAMIN) 1000 MCG tablet Take 1 tablet (1,000 mcg total) by mouth daily. 30 tablet 0   No current facility-administered medications for this encounter.    Physical Findings:  vitals were not taken for this visit.  Pain Assessment Pain Score: 7  (RT breast & headaches)/10 Unable to assess due to telephone follow-up  visit format.  Lab Findings: Lab Results  Component Value Date   WBC 7.4 03/27/2022   HGB 9.1 (L) 03/27/2022   HCT 27.7 (L) 03/27/2022   MCV 102.6 (H) 03/27/2022   PLT 294 03/27/2022     Radiographic Findings: No results found.  Impression/Plan: 42. 76 yo woman s/p resection of a solitary brain metastasis secondary to triple negative breast cancer.  She appears to be recovering well from the effects of her recent postoperative fractionated stereotactic radiotherapy and is currently without complaints from that standpoint.  We discussed the plan to obtain a posttreatment MRI brain scan in approximately 2 months to assess her treatment response and pending this scan is stable, we would then proceed with serial MRI brain scans every 3 months going forward to continue to closely monitor for any evidence of disease recurrence or progression.  I will plan to call them following each scan to discuss the results and any recommendations from the multidisciplinary brain conference but they know that they are welcome to call at anytime in the interim with any questions or concerns.  2. 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 She was treated with neoadjuvant chemotherapy followed by lumpectomy on 09/29/2013 and adjuvant breast radiotherapy 11/12/2013 through 12/24/2013.  Her recent disease restaging CT C/A/P 03/09/2022 was negative for any obvious active primary or metastatic disease.  At the time of her recent follow-up with Dr. Lindi Ramirez on 04/04/2022, the plan was made to continue in observation with a follow-up visit scheduled for 08/02/2022.  We will continue to follow expectantly.    Tabitha Johns, PA-C

## 2022-05-31 NOTE — Progress Notes (Signed)
  Radiation Oncology         [phone] ________________________________  Name: Tabitha Ramirez MRN: [id number]  Date: 05/03/2022  DOB: 1946/07/28  End of Treatment Note  Diagnosis:   76 yo woman s/p resection of a solitary brain metastasis secondary to triple negative breast cancer.     Indication for treatment:  Palliation       Radiation treatment dates:   04/27/22 - 05/03/22  Site/dose/beams/energy: Post-op fractionated SRS to the Right parietal resection cavity to 27 Gy in 3 fractions of 9 Gy each.   Narrative: The patient tolerated radiation treatment relatively well without any ill side effects.  Plan: The patient has completed radiation treatment. The patient will return to radiation oncology clinic for routine followup in one month. I advised them to call or return sooner if they have any questions or concerns related to their recovery or treatment. ________________________________  Sheral Apley. Tammi Klippel, M.D.

## 2022-06-01 ENCOUNTER — Encounter: Payer: Medicare PPO | Admitting: Speech Pathology

## 2022-06-01 ENCOUNTER — Ambulatory Visit: Payer: Medicare PPO | Admitting: Physical Therapy

## 2022-06-01 ENCOUNTER — Encounter: Payer: Self-pay | Admitting: Occupational Therapy

## 2022-06-01 ENCOUNTER — Ambulatory Visit: Payer: Medicare PPO | Admitting: Occupational Therapy

## 2022-06-01 DIAGNOSIS — R278 Other lack of coordination: Secondary | ICD-10-CM

## 2022-06-01 DIAGNOSIS — R414 Neurologic neglect syndrome: Secondary | ICD-10-CM

## 2022-06-01 DIAGNOSIS — R2681 Unsteadiness on feet: Secondary | ICD-10-CM

## 2022-06-01 DIAGNOSIS — M6281 Muscle weakness (generalized): Secondary | ICD-10-CM

## 2022-06-01 DIAGNOSIS — R2689 Other abnormalities of gait and mobility: Secondary | ICD-10-CM

## 2022-06-01 DIAGNOSIS — R208 Other disturbances of skin sensation: Secondary | ICD-10-CM

## 2022-06-01 NOTE — Patient Instructions (Signed)
Due to sensory impairment: be careful around anything, sharp hot or breakable, do not pick up anything that is heavy.

## 2022-06-01 NOTE — Therapy (Signed)
OUTPATIENT PHYSICAL THERAPY NEURO TREATMENT   Patient Name: Tabitha Ramirez MRN: [id number] DOB:06/18/1946, 76 y.o., female Today's Date: 06/01/2022   PCP: [facility]  REFERRING PROVIDER: Cathlyn Parsons, PA-C    PT End of Session - 06/01/22 1019     Visit Number 9    Number of Visits 21   Recert   Date for PT Re-Evaluation 31/54/00   Recert   Authorization Type [employer]    Progress Note Due on Visit 10    PT Start Time 1017    PT Stop Time 1059    PT Time Calculation (min) 42 min    Equipment Utilized During Treatment Gait belt    Activity Tolerance Patient tolerated treatment well    Behavior During Therapy WFL for tasks assessed/performed                    Past Medical History:  Diagnosis Date   Allergy    Anemia    Arthritis    "joints" (09/29/2013)   Breast cancer ([place])    GERD (gastroesophageal reflux disease)    Heart murmur    History of blood transfusion    "related to chemo/breast cancer" (09/29/2013)   Hypertension    Migraines    Personal history of chemotherapy    Personal history of radiation therapy    Radiation 11/05/13-12/24/13   Right breast    TMJ (temporomandibular joint syndrome)    "left; just dx'd" (09/29/2013   Past Surgical History:  Procedure Laterality Date   AXILLARY LYMPH NODE BIOPSY Right 03/10/2013   Procedure: AXILLARY LYMPH NODE BIOPSY;  Surgeon: Adin Hector, MD;  Location: [place];  Service: General;  Laterality: Right;  sentinel node with blue dye   BREAST BIOPSY     BREAST LUMPECTOMY Right 09/29/2013   needle localization w/axillary LND/notes 09/29/2013   BREAST LUMPECTOMY WITH NEEDLE LOCALIZATION AND AXILLARY LYMPH NODE DISSECTION Right 09/29/2013   Procedure: RIGHT BREAST NEEDLE LOCALIZED LUMPECTOMY AND AXILLARY LYMPH NODE DISSECTION;  Surgeon: Adin Hector, MD;  Location: [place];  Service: General;  Laterality: Right;   COLONOSCOPY N/A 04/30/2013   Procedure: COLONOSCOPY;  Surgeon: Wonda Horner, MD;  [facility];  Service: Endoscopy;  Laterality: N/A;   CRANIOTOMY Right 03/11/2022   Procedure: CRANIOTOMY TUMOR EXCISION;  Surgeon: Meade Maw, MD;  Location: [facility];  Service: Neurosurgery;  Laterality: Right;   ESOPHAGOGASTRODUODENOSCOPY N/A 04/28/2013   Procedure: ESOPHAGOGASTRODUODENOSCOPY (EGD);  Surgeon: Wonda Horner, MD;  Location: [facility];  Service: Endoscopy;  Laterality: N/A;   MASTECTOMY Right 09/29/2013   "partial"   PORT-A-CATH REMOVAL  09/29/2013   PORT-A-CATH REMOVAL Left 09/29/2013   Procedure: REMOVAL PORT-A-CATH;  Surgeon: Adin Hector, MD;  Location: [place];  Service: General;  Laterality: Left;   PORTACATH PLACEMENT N/A 03/10/2013   Procedure: INSERTION PORT-A-CATH WITH FLUOROSCOPY AND ULTRASOUND;  Surgeon: Adin Hector, MD;  Location: 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, right ([place]) 04/13/2022   Brain tumor ([place]) 03/17/2022   Palliative care encounter  Brain mass 03/09/2022   Seizure ([place]) 03/09/2022   Neuropathy 06/11/2014   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   Gait abnormality 01/26/2014   Unspecified hereditary and idiopathic peripheral neuropathy 01/26/2014   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[place]) 01/20/2014   Neuropathic pain 01/20/2014   Anxiety 01/20/2014   Unspecified deficiency anemia 01/20/2014   Hx of neutropenia 08/04/2013   Jaw pain 08/04/2013   Leukocytosis 04/27/2013   Pericardial effusion 04/27/2013   Cancer of central portion of right female breast ([place]) 02/04/2013   Need for influenza  vaccination 11/29/2011   Hypertriglyceridemia 03/07/2011   Post-menopausal 03/07/2011   Anemia 12/17/2010   Heart murmur 12/17/2010   Seasonal allergies 12/17/2010   HYPOKALEMIA 02/19/2009   Essential hypertension, benign 04/09/2008    ONSET DATE: 03/21/2022 (referral)  REFERRING DIAG: D49.6 (ICD-10-CM) - Brain tumor ([place])   THERAPY DIAG:  Muscle weakness (generalized)  Unsteadiness on feet  Other abnormalities of gait and mobility  Rationale for Evaluation and Treatment Rehabilitation  SUBJECTIVE:                                                                                                                                                                                             SUBJECTIVE STATEMENT: Pt reports feeling much better, "you do not have to work with a zombie today". No new changes   Pt accompanied by: self  PERTINENT HISTORY: hypertension, hypothyroidism, tobacco use, peripheral neuropathy followed by Dr. 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 Neuro biopsy 04/2014 showed moderate severe loss of myelinated fiber.   PAIN:  Are you having pain? Yes: NPRS scale: 3/10 Pain location: Head Pain description: throbbing/achy   PRECAUTIONS: Fall  VITALS There were no vitals filed for this visit.   WEIGHT BEARING RESTRICTIONS No  FALLS: Has patient fallen in last 6 months?  No falls but has had one near miss while performing bed mobility   PATIENT GOALS "Mobility I guess" "Independence is my main thing"    OBJECTIVE:   TODAY'S TREATMENT:  Gait Training  Gait pattern: step through pattern, decreased arm swing- Left, decreased step length- Right, decreased stride length, decreased hip/knee flexion- Right, decreased hip/knee flexion- Left, trunk flexed, and poor foot clearance- Right Distance walked: 33' turning to R, 230' turning to L  Assistive device utilized:  SPC w/quad tip on R side Level of assistance: SBA Comments:  Min cues for forward gaze throughout. Noted increased difficulty when turning to L side compared to R. No instability noted. RPE of 7/10 following gait training   Gait pattern: step through pattern, decreased arm swing- Right, decreased arm swing-  Left, decreased step length- Right, decreased stride length, decreased hip/knee flexion- Right, decreased ankle dorsiflexion- Right, trunk flexed, wide BOS, and poor foot clearance- Right Distance walked: 115' Assistive device utilized: None Level of assistance: CGA Comments: Pt demonstrates guarded gait pattern, w/abducted BUEs and absent arm swing. Min cues for relaxed shoulders to facilitate arm swing bilaterally and for upright gaze   Gait pattern: step through pattern, decreased arm swing- Left, decreased step length- Right, decreased stride length, decreased hip/knee flexion- Right, decreased ankle dorsiflexion- Right, trunk flexed, and poor foot clearance- Right Distance walked: >500' outside on sidewalk  Assistive device utilized:  [facility] w/quad tip Level of assistance: CGA Comments: Min cues provided for L attention (turning to L side) and for increased step length. Noted minor gait deviations to R throughout but no instability noted. RPE of 7/10 following gait training.   NMR  Cornhole on airex for dynamic standing balance, LUE coordination and reaching out of BOS. Min A throughout for steadying assist as pt stood on foam without UE support. Played two rounds w/basket on L side, throwing w/RUE one round and w/LUE one round, to facilitate attention and rotation to that side. Played final round w/basket on R side and throwing w/LUE. Noted increased difficulty w/balance w/cross-body reach, as pt frequently losing balance posteriorly.    PATIENT EDUCATION: Education details: continue HEP, encouraged pt use cane at home w/S* due to improved stability w/cane on level indoor surfaces and to improve her confidence w/gait.  Medbridge  [email or web address] Person  educated: Patient Education method: Explanation, Demonstration, and Handouts Education comprehension: verbalized understanding and needs further education   Home Exercise Program Access Code: [email or web address] URL: [email or web address] Date: 05/02/2022 Prepared by: Ethelene Browns  Exercises - Supine Bridge  - 1 x daily - 7 x weekly - 1 sets - 10 reps - 3-5 secs hold - Single Leg Bridge  - 1 x daily - 7 x weekly - 1 sets - 10 reps - 3 sec hold - Sidelying Hip Abduction  - 1 x daily - 7 x weekly - 1 sets - 10 reps - 2 sec hold - Clamshell  - 1 x daily - 7 x weekly - 3 sets - 10 reps - 3-5 sec hold hold - Standing Hip Flexion with Counter Support  - 1 x daily - 7 x weekly - 1 sets - 10 reps - Standing Hip Extension with Counter Support  - 1 x daily - 7 x weekly - 1 sets - 10 reps - Standing Hip Abduction with Counter Support  - 1 x daily - 7 x weekly - 3 sets - 10 reps  GOALS: Goals reviewed with patient? Yes     OLD LONG TERM GOALS: Target date: 05/23/2022   Pt will be independent with final HEP for improved strength, balance, transfers and gait.  Baseline:  Goal status: MET   2.  Pt will improve Berg score to >/= 46/56 to demonstrate decreased fall risk   Baseline: 38/56 (04/21/22), 35/56 (8/29) Goal status: IN PROGRESS   3.  Pt will ambulate >500' on level and unlevel surfaces w/LRAD mod I for return to community mobility and improved visual scanning  Baseline:  Goal status: MET   4.  Pt will improve 5 x STS to less than or equal to 15 seconds without UE support to demonstrate improved functional strength and transfer efficiency.    Baseline: 16.72s w/BUE support, 18.63 s with BUE support (8/29)  Goal status: IN PROGRESS   5.  Pt will improve gait velocity to at least 2.7 ft/s w/LRAD for improved gait efficiency and performance at community ambulator level    Baseline: 1.82 ft/s w/RW, 2.08 ft/sec with RW (8/10), 2.4 ft/sec (8/29) Goal status: IN PROGRESS   6. Pt and  daughter will verbalize fall prevention strategies to be used in the home and community environment for reduced fall risk             Baseline:             Goal Status: IN PROGRESS  NEW LONG TERM GOALS FOR UPDATED POC: Target date: 06/27/2022     1.  Pt will improve Berg score to >/= 46/56 to demonstrate decreased fall risk  Baseline: 38/56 (04/21/22), 35/56 (8/29) Goal status: IN PROGRESS    2.  Pt will improve 5 x STS to less than or equal to 15 seconds without UE support to demonstrate improved functional strength and transfer efficiency.    Baseline: 16.72s w/BUE support, 18.63 s with BUE support (8/29)  Goal status: IN PROGRESS   3.  Pt will improve gait velocity to at least 2.7 ft/s w/LRAD for improved gait efficiency and performance at community ambulator level    Baseline: 1.82 ft/s w/RW, 2.08 ft/sec with RW (8/10), 2.4 ft/sec (8/29) Goal status: IN PROGRESS   4. Pt and daughter will verbalize fall prevention strategies to be used in the home and community environment for reduced fall risk             Baseline:             Goal Status: IN PROGRESS  5. Pt will improve normal TUG to less than or equal to 20 seconds w/LRAD for improved functional mobility and decreased fall risk.  Baseline: 25.84s w/RW, 25 sec with RW (8/10), 23 sec with RW (8/15) Goal status: IN PROGRESS     ASSESSMENT:   CLINICAL IMPRESSION: Emphasis of skilled PT session on gait training with various ADs, L inattention and stability w/reaching out of BOS. Pt demonstrated improved stability w/use of SPC w/quad tip today, requiring S* on level indoor surfaces and CGA on unlevel surfaces. Recommended pt use cane in home w/S* from daughter to improve her confidence w/use of cane. Continue POC.     OBJECTIVE IMPAIRMENTS decreased balance, decreased cognition, decreased coordination, decreased knowledge of condition, decreased knowledge of use of DME, difficulty walking, decreased safety awareness, impaired  perceived functional ability, impaired sensation, and impaired vision/preception.    ACTIVITY LIMITATIONS lifting, bending, squatting, transfers, bed mobility, dressing, and caring for others   PARTICIPATION LIMITATIONS: meal prep, cleaning, laundry, medication management, personal finances, driving, shopping, community activity, and yard work   [place], Transportation, and 1 comorbidity: L inattention  are also affecting patient's functional outcome.    REHAB POTENTIAL: Good   CLINICAL DECISION MAKING: Stable/uncomplicated   EVALUATION COMPLEXITY: Low   PLAN: PT FREQUENCY: 2x/week   PT DURATION: 6 weeks   PLANNED INTERVENTIONS: Therapeutic exercises, Therapeutic activity, Neuromuscular re-education, Balance training, Gait training, Patient/Family education, Self Care, Joint mobilization, Visual/preceptual remediation/compensation, DME instructions, and Re-evaluation   PLAN FOR NEXT SESSION: 10th visit PN, work on L inattention, progress to gait w/reduced AD support ([place] with rubber quad tip)--pt to try to bring in her [facility] next visit, dynamic balance with decreased UE support, LLE step overs and hurdles, resisted step-taps     Charlett Nose, PT, [place] [address] [place], [place]  [address] Phone:  [phone] Fax:  [phone] 05/30/22      11:02am

## 2022-06-01 NOTE — Therapy (Signed)
OUTPATIENT OCCUPATIONAL THERAPY NEURO TREATMENT  Patient Name: Tabitha Ramirez MRN: [id number] DOB:[birth year], 76 y.o., female Today's Date: 06/01/2022  [facility]P: [facility] REFERRING PROVIDER: Dr. Naaman Plummer   OT End of Session - 06/01/22 1102     Visit Number 8    Number of Visits 25    Date for OT Re-Evaluation 07/04/22    Authorization Type [employer]    Authorization - Visit Number 8    Progress Note Due on Visit 10    OT Start Time 1101    OT Stop Time 6967    OT Time Calculation (min) 43 min                Past Medical History:  Diagnosis Date   Allergy    Anemia    Arthritis    "joints" (09/29/2013)   Breast cancer ([place])    GERD (gastroesophageal reflux disease)    Heart murmur    History of blood transfusion    "related to chemo/breast cancer" (09/29/2013)   Hypertension    Migraines    Personal history of chemotherapy    Personal history of radiation therapy    Radiation 11/05/13-12/24/13   Right breast    TMJ (temporomandibular joint syndrome)    "left; just dx'd" (09/29/2013   Past Surgical History:  Procedure Laterality Date   AXILLARY LYMPH NODE BIOPSY Right 03/10/2013   Procedure: AXILLARY LYMPH NODE BIOPSY;  Surgeon: Adin Hector, MD;  Location: [place];  Service: General;  Laterality: Right;  sentinel node with blue dye   BREAST BIOPSY     BREAST LUMPECTOMY Right 09/29/2013   needle localization w/axillary LND/notes 09/29/2013   BREAST LUMPECTOMY WITH NEEDLE LOCALIZATION AND AXILLARY LYMPH NODE DISSECTION Right 09/29/2013   Procedure: RIGHT BREAST NEEDLE LOCALIZED LUMPECTOMY AND AXILLARY LYMPH NODE DISSECTION;  Surgeon: Adin Hector, MD;  Location: [place];  Service: General;  Laterality: Right;   COLONOSCOPY N/A 04/30/2013   Procedure: COLONOSCOPY;  Surgeon: Wonda Horner, MD;  [facility];  Service: Endoscopy;  Laterality: N/A;   CRANIOTOMY Right 03/11/2022   Procedure: CRANIOTOMY TUMOR EXCISION;  Surgeon: Meade Maw, MD;  Location: [facility];  Service: Neurosurgery;  Laterality: Right;   ESOPHAGOGASTRODUODENOSCOPY N/A 04/28/2013   Procedure: ESOPHAGOGASTRODUODENOSCOPY (EGD);  Surgeon: Wonda Horner, MD;  [facility];  Service: Endoscopy;  Laterality: N/A;   MASTECTOMY Right 09/29/2013   "partial"   PORT-A-CATH REMOVAL  09/29/2013   PORT-A-CATH REMOVAL Left 09/29/2013   Procedure: REMOVAL PORT-A-CATH;  Surgeon: Adin Hector, MD;  Location: [place];  Service: General;  Laterality: Left;   PORTACATH PLACEMENT N/A 03/10/2013   Procedure: INSERTION PORT-A-CATH WITH FLUOROSCOPY AND ULTRASOUND;  Surgeon: Adin Hector, MD;  Location: [place];  Service: General;  Laterality: N/A;   SURAL NERVE BX Right 05/13/2014   Procedure: SURAL NERVE BIOPSY;  Surgeon: Hosie Spangle, MD;  [facility];  Service: Neurosurgery;  Laterality: Right;  Right sural nerve biopsy   TONSILLECTOMY     TOTAL ABDOMINAL HYSTERECTOMY     With bilateral salpingo-oophorectomy   Patient Active Problem List   Diagnosis Date Noted   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, right ([place]) 04/13/2022   Brain tumor ([place]) 03/17/2022   Palliative care encounter    Brain mass 03/09/2022   Seizure ([place]) 03/09/2022   Neuropathy 06/11/2014   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   Gait abnormality 01/26/2014   Unspecified hereditary and idiopathic peripheral neuropathy 01/26/2014  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[place]) 01/20/2014   Neuropathic pain 01/20/2014   Anxiety 01/20/2014   Unspecified deficiency anemia 01/20/2014   Hx of neutropenia 08/04/2013   Jaw pain 08/04/2013   Leukocytosis 04/27/2013   Pericardial effusion 04/27/2013   Cancer of central portion of right female breast ([place]) 02/04/2013   Need for influenza vaccination 11/29/2011   Hypertriglyceridemia 03/07/2011   Post-menopausal 03/07/2011   Anemia 12/17/2010   Heart murmur 12/17/2010   Seasonal allergies 12/17/2010   HYPOKALEMIA 02/19/2009    Essential hypertension, benign 04/09/2008    ONSET DATE: 03/11/22, craniotomy  REFERRING DIAG: D49.6 (ICD-10-CM) - Brain tumor ([place])  THERAPY DIAG:  Muscle weakness (generalized)  Unsteadiness on feet  Other abnormalities of gait and mobility  Other lack of coordination  Neurologic neglect syndrome  Other disturbances of skin sensation  Rationale for Evaluation and Treatment Rehabilitation  SUBJECTIVE:   SUBJECTIVE STATEMENT:  Pt reports that oncologist put her on a new medication last week and that it has "zonked her out" and made her sleep all the time so she hasn't done much at home. Pt is felling better today. PERTINENT HISTORY:  PMH significant for breast CA in remission for 7 years,  status post lobectomy, chemotherapy, radiation, hypertension, hypothyroidism, severe peripheral neuropathy status post IVIG, hx of seizure. 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 Pt was seen on CIR and d/c home 03/27/22  PRECAUTIONS: Fall  WEIGHT BEARING RESTRICTIONS No  PAIN:  Are you having pain? No   PATIENT GOALS to be more independent  OBJECTIVE:   Per initial OT Assessment: HAND DOMINANCE: Right  HAND FUNCTION: Grip strength: Right: 36.5 lbs; Left: 41.0 lbs  COORDINATION: 9 Hole Peg test: Right: 1 min 43 secs  Left: 7pegs in 2 min   Box and Blocks:  Right 37 blocks, Left  20 blocks   VISION ASSESSMENT: Visual Fields: per chart left visual field deficit,  pt's dtr reports L neglect  PERCEPTION: Impaired: Inattention/neglect: does not attend to left side of body   TODAY'S TREATMENT:     Tabletop scanning with 65M print to count the number of times a number repeats in a row, pt completed with 100% accuracy in a busy environment. Reviewed visual scanning strategies verbally and provided written guide for compensation for sensory loss. Therapist checked box and blocks and 9 hole peg test today. Environmental scanning with 11/14 located on first pass: 79%  accuracy.  HOME EXERCISE PROGRAM: 05/02/22: Visual scanning strategies 05/04/22: Coordination HEP 05/23/22: Continue with visual scanning activity at home, word finds and coordination HEP.   GOALS:   SHORT TERM GOALS: Target date:  06/04/22  I with initial HEP Goal status: IN PROGRESS  2.  Pt will perform dressing with supervision only Goal status: INITIAL  3.  Pt will verbalize understanding of compensatory strategies for visual deficits/ left neglect and sensory deficits. Goal status: IN PROGRESS, needs continued reinforcement, education provided, but pt is unable to state  4.  Pt will demonstrate improved bilateral UE functional use as evidenced by increasing box/ blocks score by 5 blocks bilaterally. Baseline: Box and Blocks:  Right 37 blocks, Left  20 blocks Goal status: ongoing 05/30/22:  R-29 blocks, L-blocks  5.  Pt will perform functional tabletop scanning activities with 90% or better accuracy Goal status:  in progress, 100% on 06/01/22 for  65M number cancellation however still difficulty with copy peg design  6.  Improve grip strength Rt hand by 5 lbs Baseline: Rt =  36.5 lbs, Lt = 41.0 LBS Goal status: IN PROGRESS  05/30/22:  R-36.8lbs, L-37.2lbs    LONG TERM GOALS: Target date: 07/04/22  I with updated HEP. Goal status: INITIAL  2.  Pt will perform all basic ADLs mod I Goal status: INITIAL  3.  Pt will perform basic home management with supervision demonstrating good safety awareness. Goal status: INITIAL  4.  Pt will perform environmental scanning in a mod distracting environment with 90% accuracy and no LOB. Goal status: ongoing 79% on 06/01/22  5.  Pt will perform basic cooking task with min A demonstrating good safety awareness  Goal status: INITIAL  6.  Pt will demonstrate improved LUE fine motor coordination as evidenced by placing 9 pegs in 2 mins. Baseline: 7 pegs in 2 mins Goal status: ongoing 4 pegs in 2 mins   7. Pt  will demonstrate improved fine  motor coordination as evidenced by decreasing 9 hole peg test score to 90 secs or less RUE  Baseline: 1 min 43 secs initial   Goal status:ongoing , 91.53  ASSESSMENT:  CLINICAL IMPRESSION:  Pt is progressing slowly towards goals.  She demonstrates improving basic tabletop scanning for number cancellation and environmental scanning.  PERFORMANCE DEFICITS in functional skills including ADLs, IADLs, coordination, dexterity, proprioception, sensation, strength, FMC, GMC, mobility, balance, endurance, decreased knowledge of precautions, decreased knowledge of use of DME, vision, and UE functional use, cognitive skills including attention, learn, memory, perception, problem solving, safety awareness, sequencing, thought, and understand, and psychosocial skills including coping strategies, environmental adaptation, habits, interpersonal interactions, and routines and behaviors.   IMPAIRMENTS are limiting patient from ADLs, IADLs, play, leisure, and social participation.   COMORBIDITIES may have co-morbidities  that affects occupational performance. Patient will benefit from skilled OT to address above impairments and improve overall function.  MODIFICATION OR ASSISTANCE TO COMPLETE EVALUATION: No modification of tasks or assist necessary to complete an evaluation.  OT OCCUPATIONAL PROFILE AND HISTORY: Detailed assessment: Review of records and additional review of physical, cognitive, psychosocial history related to current functional performance.  CLINICAL DECISION MAKING: LOW - limited treatment options, no task modification necessary  REHAB POTENTIAL: Good  EVALUATION COMPLEXITY: Low   PLAN: OT FREQUENCY: 2x/week  OT DURATION: 12 weeks plus eval  PLANNED INTERVENTIONS: self care/ADL training, therapeutic exercise, therapeutic activity, neuromuscular re-education, manual therapy, passive range of motion, gait training, balance training, functional mobility training, aquatic therapy,  fluidotherapy, moist heat, cryotherapy, patient/family education, cognitive remediation/compensation, visual/perceptual remediation/compensation, energy conservation, coping strategies training, DME and/or AE instructions, and Re-evaluation  RECOMMENDED OTHER SERVICES: PT, ST  CONSULTED AND AGREED WITH PLAN OF CARE: Patient and family member/caregiver  PLAN FOR NEXT SESSION:    LUE functional use, how to orient a shirt for dressing,simple home management/ snack prep   Glenden Rossell, OTR/L 06/01/2022, 11:03 AM 11:03 AM 06/01/22

## 2022-06-05 ENCOUNTER — Other Ambulatory Visit: Payer: Self-pay | Admitting: Nurse Practitioner

## 2022-06-05 DIAGNOSIS — C50911 Malignant neoplasm of unspecified site of right female breast: Secondary | ICD-10-CM

## 2022-06-05 DIAGNOSIS — Z515 Encounter for palliative care: Secondary | ICD-10-CM

## 2022-06-05 DIAGNOSIS — G893 Neoplasm related pain (acute) (chronic): Secondary | ICD-10-CM

## 2022-06-05 MED ORDER — OXYCODONE-ACETAMINOPHEN 5-325 MG PO TABS: 1.0000 | ORAL_TABLET | Freq: Three times a day (TID) | ORAL | 0 refills | Status: DC | PRN

## 2022-06-06 ENCOUNTER — Encounter: Payer: Medicare PPO | Admitting: Speech Pathology

## 2022-06-06 ENCOUNTER — Ambulatory Visit: Payer: Medicare PPO | Admitting: Physical Therapy

## 2022-06-06 ENCOUNTER — Ambulatory Visit: Payer: Medicare PPO | Admitting: Occupational Therapy

## 2022-06-06 DIAGNOSIS — R278 Other lack of coordination: Secondary | ICD-10-CM

## 2022-06-06 DIAGNOSIS — R2689 Other abnormalities of gait and mobility: Secondary | ICD-10-CM

## 2022-06-06 DIAGNOSIS — R2681 Unsteadiness on feet: Secondary | ICD-10-CM

## 2022-06-06 DIAGNOSIS — R414 Neurologic neglect syndrome: Secondary | ICD-10-CM

## 2022-06-06 DIAGNOSIS — R208 Other disturbances of skin sensation: Secondary | ICD-10-CM

## 2022-06-06 DIAGNOSIS — M6281 Muscle weakness (generalized): Secondary | ICD-10-CM

## 2022-06-06 NOTE — Therapy (Signed)
OUTPATIENT PHYSICAL THERAPY NEURO TREATMENT- 10TH VISIT PROGRESS NOTE   Patient Name: Tabitha Ramirez MRN: [id number] DOB:January 05, 1946, 76 y.o., female Today's Date: 06/06/2022   PCP: [facility]  REFERRING PROVIDER: Cathlyn Parsons, PA-C   Physical Therapy Progress Note   Dates of Reporting Period:04/11/22 - 06/06/22  See Note below for Objective Data and Assessment of Progress/Goals.  Thank you for the referral of this patient. Mickie Bail Arelyn Gauer, PT, DPT    PT End of Session - 06/06/22 1106     Visit Number 10    Number of Visits 21   Recert   Date for PT Re-Evaluation 09/73/53   Recert   Authorization Type [employer]    Progress Note Due on Visit 10    PT Start Time 1104    PT Stop Time 1144    PT Time Calculation (min) 40 min    Equipment Utilized During Treatment Gait belt    Activity Tolerance Patient tolerated treatment well    Behavior During Therapy WFL for tasks assessed/performed                     Past Medical History:  Diagnosis Date   Allergy    Anemia    Arthritis    "joints" (09/29/2013)   Breast cancer ([place])    GERD (gastroesophageal reflux disease)    Heart murmur    History of blood transfusion    "related to chemo/breast cancer" (09/29/2013)   Hypertension    Migraines    Personal history of chemotherapy    Personal history of radiation therapy    Radiation 11/05/13-12/24/13   Right breast    TMJ (temporomandibular joint syndrome)    "left; just dx'd" (09/29/2013   Past Surgical History:  Procedure Laterality Date   AXILLARY LYMPH NODE BIOPSY Right 03/10/2013   Procedure: AXILLARY LYMPH NODE BIOPSY;  Surgeon: Adin Hector, MD;  Location: [facility];  Service: General;  Laterality: Right;  sentinel node with blue dye   BREAST BIOPSY     BREAST LUMPECTOMY Right 09/29/2013   needle localization w/axillary LND/notes 09/29/2013   BREAST LUMPECTOMY WITH NEEDLE LOCALIZATION AND AXILLARY LYMPH NODE DISSECTION Right 09/29/2013    Procedure: RIGHT BREAST NEEDLE LOCALIZED LUMPECTOMY AND AXILLARY LYMPH NODE DISSECTION;  Surgeon: Adin Hector, MD;  Location: [facility];  Service: General;  Laterality: Right;   COLONOSCOPY N/A 04/30/2013   Procedure: COLONOSCOPY;  Surgeon: Wonda Horner, MD;  [facility];  Service: Endoscopy;  Laterality: N/A;   CRANIOTOMY Right 03/11/2022   Procedure: CRANIOTOMY TUMOR EXCISION;  Surgeon: Meade Maw, MD;  Location: [facility];  Service: Neurosurgery;  Laterality: Right;   ESOPHAGOGASTRODUODENOSCOPY N/A 04/28/2013   Procedure: ESOPHAGOGASTRODUODENOSCOPY (EGD);  Surgeon: Wonda Horner, MD;  Location[facility];  Service: Endoscopy;  Laterality: N/A;   MASTECTOMY Right 09/29/2013   "partial"   PORT-A-CATH REMOVAL  09/29/2013   PORT-A-CATH REMOVAL Left 09/29/2013   Procedure: REMOVAL PORT-A-CATH;  Surgeon: Adin Hector, MD;  [facility]  Service: General;  Laterality: Left;   PORTACATH PLACEMENT N/A 03/10/2013   Procedure: INSERTION PORT-A-CATH WITH FLUOROSCOPY AND ULTRASOUND;  Surgeon: Adin Hector, MD;  Location: [place] [facility];  Service: General;  Laterality: N/A;   SURAL NERVE BX Right 05/13/2014   Procedure: SURAL NERVE BIOPSY;  Surgeon: Hosie Spangle, MD;  [facility];  Service: Neurosurgery;  Laterality: Right;  Right sural nerve biopsy   TONSILLECTOMY     TOTAL  ABDOMINAL HYSTERECTOMY     With bilateral salpingo-oophorectomy   Patient Active Problem List   Diagnosis Date Noted   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, right ([place]) 04/13/2022   Brain tumor ([place]) 03/17/2022   Palliative care encounter    Brain mass 03/09/2022   Seizure ([place]) 03/09/2022   Neuropathy 06/11/2014   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   Gait abnormality 01/26/2014   Unspecified hereditary and idiopathic peripheral neuropathy 01/26/2014   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[place]) 01/20/2014   Neuropathic pain 01/20/2014   Anxiety 01/20/2014    Unspecified deficiency anemia 01/20/2014   Hx of neutropenia 08/04/2013   Jaw pain 08/04/2013   Leukocytosis 04/27/2013   Pericardial effusion 04/27/2013   Cancer of central portion of right female breast ([place]) 02/04/2013   Need for influenza vaccination 11/29/2011   Hypertriglyceridemia 03/07/2011   Post-menopausal 03/07/2011   Anemia 12/17/2010   Heart murmur 12/17/2010   Seasonal allergies 12/17/2010   HYPOKALEMIA 02/19/2009   Essential hypertension, benign 04/09/2008    ONSET DATE: 03/21/2022 (referral)  REFERRING DIAG: D49.6 (ICD-10-CM) - Brain tumor ([place])   THERAPY DIAG:  Other abnormalities of gait and mobility  Other lack of coordination  Muscle weakness (generalized)  Rationale for Evaluation and Treatment Rehabilitation  SUBJECTIVE:                                                                                                                                                                                             SUBJECTIVE STATEMENT: Pt reports she is feeling better   Pt accompanied by: self  PERTINENT HISTORY: hypertension, hypothyroidism, tobacco use, peripheral neuropathy followed by Dr. 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 Neuro biopsy 04/2014 showed moderate severe loss of myelinated fiber.   PAIN:  Are you having pain? Yes: NPRS scale: 5/10 Pain location: Breast Pain description: throbbing/achy   PRECAUTIONS: Fall  VITALS There were no vitals filed for this visit.   WEIGHT BEARING RESTRICTIONS No  FALLS: Has patient fallen in last 6 months?  No falls but has had one near miss while performing bed mobility   PATIENT GOALS "Mobility I guess" "Independence is my main thing"    OBJECTIVE:   TODAY'S TREATMENT:  Ther Ex  SciFit multi-peaks level 7 for 8 minutes using BUE/BLEs for neural priming for reciprocal movement, dynamic cardiovascular warmup and increased amplitude of stepping. RPE of 7/10 following  activity   NMR  -Practiced weaving through cones w/RW for improved AD management with turns and L visual scanning. Pt frequently knocked cone over on L side due to inattention. Mod verbal cues to  remain inside walker at all times w/turns, as pt tends to step outside of RW and get tripped over legs of walker. Pt weaved through 5 cones x4 and demonstrated very small, shuffling steps despite cues for improved quarter turns.  -Progressed to obstacle course (mix of cone weaving and 2" beam step-overs) using SPC w/quad tip for improved balance, obstacle navigation and sequencing of cane. Min A throughout for steadying assist. Pt had most difficulty w/2" beam step overs, as she tends to lead w/RLE and demonstrated poor cane placement. Progressed to 4" hurdle navigation w/SPC with quad tip, x4. Pt initially required min verbal cues to remember to lead w/LLE and proper cane placement. Pt tends to place can anteromedially, blocking where her feet need to go to successfully step over hurdle. W/practice, pt able to perform well without need for cues.     PATIENT EDUCATION: Education details: continue HEP, continue to encourage pt to use cane at home w/S* due to improved stability w/cane on level indoor surfaces and to improve her confidence w/gait.  Medbridge  [id number] Person educated: Patient Education method: Explanation, Demonstration, and Handouts Education comprehension: verbalized understanding and needs further education   Home Exercise Program Access Code: [email or web address] URL: [email or web address] Date: 05/02/2022 Prepared by: Ethelene Browns  Exercises - Supine Bridge  - 1 x daily - 7 x weekly - 1 sets - 10 reps - 3-5 secs hold - Single Leg Bridge  - 1 x daily - 7 x weekly - 1 sets - 10 reps - 3 sec hold - Sidelying Hip Abduction  - 1 x daily - 7 x weekly - 1 sets - 10 reps - 2 sec hold - Clamshell  - 1 x daily - 7 x weekly - 3 sets - 10 reps - 3-5 sec hold hold - Standing Hip Flexion  with Counter Support  - 1 x daily - 7 x weekly - 1 sets - 10 reps - Standing Hip Extension with Counter Support  - 1 x daily - 7 x weekly - 1 sets - 10 reps - Standing Hip Abduction with Counter Support  - 1 x daily - 7 x weekly - 3 sets - 10 reps  GOALS: Goals reviewed with patient? Yes     OLD LONG TERM GOALS: Target date: 05/23/2022   Pt will be independent with final HEP for improved strength, balance, transfers and gait.  Baseline:  Goal status: MET   2.  Pt will improve Berg score to >/= 46/56 to demonstrate decreased fall risk   Baseline: 38/56 (04/21/22), 35/56 (8/29) Goal status: IN PROGRESS   3.  Pt will ambulate >500' on level and unlevel surfaces w/LRAD mod I for return to community mobility and improved visual scanning  Baseline:  Goal status: MET   4.  Pt will improve 5 x STS to less than or equal to 15 seconds without UE support to demonstrate improved functional strength and transfer efficiency.    Baseline: 16.72s w/BUE support, 18.63 s with BUE support (8/29)  Goal status: IN PROGRESS   5.  Pt will improve gait velocity to at least 2.7 ft/s w/LRAD for improved gait efficiency and performance at community ambulator level    Baseline: 1.82 ft/s w/RW, 2.08 ft/sec with RW (8/10), 2.4 ft/sec (8/29) Goal status: IN PROGRESS   6. Pt and daughter will verbalize fall prevention strategies to be used in the home and community environment for reduced fall risk  Baseline:             Goal Status: IN PROGRESS  NEW LONG TERM GOALS FOR UPDATED POC: Target date: 06/27/2022     1.  Pt will improve Berg score to >/= 46/56 to demonstrate decreased fall risk  Baseline: 38/56 (04/21/22), 35/56 (8/29) Goal status: IN PROGRESS    2.  Pt will improve 5 x STS to less than or equal to 15 seconds without UE support to demonstrate improved functional strength and transfer efficiency.    Baseline: 16.72s w/BUE support, 18.63 s with BUE support (8/29)  Goal status: IN  PROGRESS   3.  Pt will improve gait velocity to at least 2.7 ft/s w/LRAD for improved gait efficiency and performance at community ambulator level    Baseline: 1.82 ft/s w/RW, 2.08 ft/sec with RW (8/10), 2.4 ft/sec (8/29) Goal status: IN PROGRESS   4. Pt and daughter will verbalize fall prevention strategies to be used in the home and community environment for reduced fall risk             Baseline:             Goal Status: IN PROGRESS  5. Pt will improve normal TUG to less than or equal to 20 seconds w/LRAD for improved functional mobility and decreased fall risk.  Baseline: 25.84s w/RW, 25 sec with RW (8/10), 23 sec with RW (8/15) Goal status: IN PROGRESS     ASSESSMENT:   CLINICAL IMPRESSION: Emphasis of skilled PT session on obstacle navigation w/various ADs and shifting focus to L side. Pt still very hesitant to use SPC at home despite being very stable with it in clinic. Pt demonstrates difficulty turning w/RW due to poor body mechanics. W/visual cues, pt able to demonstrate improved AD management but reports feeling "weird" when staying inside the walker. Pt able to navigate 4" hurdles w/SPC well, but required encouragement to perform due to fear-avoidance behavior. Continue POC.     OBJECTIVE IMPAIRMENTS decreased balance, decreased cognition, decreased coordination, decreased knowledge of condition, decreased knowledge of use of DME, difficulty walking, decreased safety awareness, impaired perceived functional ability, impaired sensation, and impaired vision/preception.    ACTIVITY LIMITATIONS lifting, bending, squatting, transfers, bed mobility, dressing, and caring for others   PARTICIPATION LIMITATIONS: meal prep, cleaning, laundry, medication management, personal finances, driving, shopping, community activity, and yard work   [place], Transportation, and 1 comorbidity: L inattention  are also affecting patient's functional outcome.    REHAB POTENTIAL:  Good   CLINICAL DECISION MAKING: Stable/uncomplicated   EVALUATION COMPLEXITY: Low   PLAN: PT FREQUENCY: 2x/week   PT DURATION: 6 weeks   PLANNED INTERVENTIONS: Therapeutic exercises, Therapeutic activity, Neuromuscular re-education, Balance training, Gait training, Patient/Family education, Self Care, Joint mobilization, Visual/preceptual remediation/compensation, DME instructions, and Re-evaluation   PLAN FOR NEXT SESSION: work on L inattention, progress to gait w/reduced AD support ([place] with rubber quad tip)--pt to try to bring in her [facility] next visit, dynamic balance with decreased UE support, LLE step overs and hurdles, resisted step-taps     Charlett Nose, PT, [place] [address] [place], [place]  [address] Phone:  [phone] Fax:  [phone] 05/30/22      11:02am

## 2022-06-06 NOTE — Therapy (Signed)
OUTPATIENT OCCUPATIONAL THERAPY NEURO TREATMENT  Patient Name: Tabitha Ramirez MRN: [id number] DOB:1946/03/12, 76 y.o., female Today's Date: 06/06/2022  [facility]P: [facility] REFERRING PROVIDER: Dr. Naaman Plummer   OT End of Session - 06/06/22 0938     Visit Number 9    Number of Visits 25    Date for OT Re-Evaluation 07/04/22    Authorization Type [employer]    Authorization - Visit Number 9    Progress Note Due on Visit 10    OT Start Time 0936    OT Stop Time 8299    OT Time Calculation (min) 39 min                Past Medical History:  Diagnosis Date   Allergy    Anemia    Arthritis    "joints" (09/29/2013)   Breast cancer ([place])    GERD (gastroesophageal reflux disease)    Heart murmur    History of blood transfusion    "related to chemo/breast cancer" (09/29/2013)   Hypertension    Migraines    Personal history of chemotherapy    Personal history of radiation therapy    Radiation 11/05/13-12/24/13   Right breast    TMJ (temporomandibular joint syndrome)    "left; just dx'd" (09/29/2013   Past Surgical History:  Procedure Laterality Date   AXILLARY LYMPH NODE BIOPSY Right 03/10/2013   Procedure: AXILLARY LYMPH NODE BIOPSY;  Surgeon: Adin Hector, MD;  Location: [facility];  Service: General;  Laterality: Right;  sentinel node with blue dye   BREAST BIOPSY     BREAST LUMPECTOMY Right 09/29/2013   needle localization w/axillary LND/notes 09/29/2013   BREAST LUMPECTOMY WITH NEEDLE LOCALIZATION AND AXILLARY LYMPH NODE DISSECTION Right 09/29/2013   Procedure: RIGHT BREAST NEEDLE LOCALIZED LUMPECTOMY AND AXILLARY LYMPH NODE DISSECTION;  Surgeon: Adin Hector, MD;  Location: [facility];  Service: General;  Laterality: Right;   COLONOSCOPY N/A 04/30/2013   Procedure: COLONOSCOPY;  Surgeon: Wonda Horner, MD;  [facility];  Service: Endoscopy;  Laterality: N/A;   CRANIOTOMY Right 03/11/2022   Procedure: CRANIOTOMY TUMOR EXCISION;  Surgeon: Meade Maw, MD;  Location: [facility];  Service: Neurosurgery;  Laterality: Right;   ESOPHAGOGASTRODUODENOSCOPY N/A 04/28/2013   Procedure: ESOPHAGOGASTRODUODENOSCOPY (EGD);  Surgeon: Wonda Horner, MD;  [facility];  Service: Endoscopy;  Laterality: N/A;   MASTECTOMY Right 09/29/2013   "partial"   PORT-A-CATH REMOVAL  09/29/2013   PORT-A-CATH REMOVAL Left 09/29/2013   Procedure: REMOVAL PORT-A-CATH;  Surgeon: Adin Hector, MD;  Location: [facility];  Service: General;  Laterality: Left;   PORTACATH PLACEMENT N/A 03/10/2013   Procedure: INSERTION PORT-A-CATH WITH FLUOROSCOPY AND ULTRASOUND;  Surgeon: Adin Hector, MD;  Location: [facility];  Service: General;  Laterality: N/A;   SURAL NERVE BX Right 05/13/2014   Procedure: SURAL NERVE BIOPSY;  Surgeon: Hosie Spangle, MD;  [facility];  Service: Neurosurgery;  Laterality: Right;  Right sural nerve biopsy   TONSILLECTOMY     TOTAL ABDOMINAL HYSTERECTOMY     With bilateral salpingo-oophorectomy   Patient Active Problem List   Diagnosis Date Noted   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, right ([place]) 04/13/2022   Brain tumor ([place]) 03/17/2022   Palliative care encounter    Brain mass 03/09/2022   Seizure ([place]) 03/09/2022   Neuropathy 06/11/2014   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   Gait abnormality 01/26/2014   Unspecified hereditary and idiopathic peripheral neuropathy 01/26/2014  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[place]) 01/20/2014   Neuropathic pain 01/20/2014   Anxiety 01/20/2014   Unspecified deficiency anemia 01/20/2014   Hx of neutropenia 08/04/2013   Jaw pain 08/04/2013   Leukocytosis 04/27/2013   Pericardial effusion 04/27/2013   Cancer of central portion of right female breast ([place]) 02/04/2013   Need for influenza vaccination 11/29/2011   Hypertriglyceridemia 03/07/2011   Post-menopausal 03/07/2011   Anemia 12/17/2010   Heart murmur 12/17/2010   Seasonal allergies 12/17/2010   HYPOKALEMIA 02/19/2009    Essential hypertension, benign 04/09/2008    ONSET DATE: 03/11/22, craniotomy  REFERRING DIAG: D49.6 (ICD-10-CM) - Brain tumor ([place])  THERAPY DIAG:  Muscle weakness (generalized)  Unsteadiness on feet  Other abnormalities of gait and mobility  Other lack of coordination  Neurologic neglect syndrome  Other disturbances of skin sensation  Rationale for Evaluation and Treatment Rehabilitation  SUBJECTIVE:   SUBJECTIVE STATEMENT:  Pt reports breast pain  PERTINENT HISTORY:  PMH significant for breast CA in remission for 7 years,  status post lobectomy, chemotherapy, radiation, hypertension, hypothyroidism, severe peripheral neuropathy status post IVIG, hx of seizure. 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 Pt was seen on CIR and d/c home 03/27/22  PRECAUTIONS: Fall  WEIGHT BEARING RESTRICTIONS No  PAIN:  Are you having pain? No   PATIENT GOALS to be more independent  OBJECTIVE:   Per initial OT Assessment: HAND DOMINANCE: Right  HAND FUNCTION: Grip strength: Right: 36.5 lbs; Left: 41.0 lbs  COORDINATION: 9 Hole Peg test: Right: 1 min 43 secs  Left: 7pegs in 2 min   Box and Blocks:  Right 37 blocks, Left  20 blocks   VISION ASSESSMENT: Visual Fields: per chart left visual field deficit,  pt's dtr reports L neglect  PERCEPTION: Impaired: Inattention/neglect: does not attend to left side of body   TODAY'S TREATMENT:  Copying medium peg design with left and right UE's for increased fine motor coordination and visual scanning , mod difficulty and increased time due to coordination. Removing pegs with right hand while holding pegboard with left hand min difficulty, v.c HOME EXERCISE PROGRAM: 05/02/22: Visual scanning strategies 05/04/22: Coordination HEP 05/23/22: Continue with visual scanning activity at home, word finds and coordination HEP.   GOALS:   SHORT TERM GOALS: Target date:  06/04/22  I with initial HEP Goal status: met, performing coordination  activities at home  2.  Pt will perform dressing with supervision only Goal status: met supervision  3.  Pt will verbalize understanding of compensatory strategies for visual deficits/ left neglect and sensory deficits. Goal status: IN PROGRESS, needs continued reinforcement, education provided, but pt is unable to state  4.  Pt will demonstrate improved bilateral UE functional use as evidenced by increasing box/ blocks score by 5 blocks bilaterally. Baseline: Box and Blocks:  Right 37 blocks, Left  20 blocks Goal status: ongoing 05/30/22:  R-29 blocks, L-blocks  5.  Pt will perform functional tabletop scanning activities with 90% or better accuracy Goal status:  in progress, 100% on 06/01/22 for  49M number cancellation however still difficulty with copy peg design  6.  Improve grip strength Rt hand by 5 lbs Baseline: Rt = 36.5 lbs, Lt = 41.0 LBS Goal status: IN PROGRESS  05/30/22:  R-36.8lbs, L-37.2lbs    LONG TERM GOALS: Target date: 07/04/22  I with updated HEP. Goal status: INITIAL  2.  Pt will perform all basic ADLs mod I Goal status: INITIAL  3.  Pt will perform basic  home management with supervision demonstrating good safety awareness. Goal status: INITIAL  4.  Pt will perform environmental scanning in a mod distracting environment with 90% accuracy and no LOB. Goal status: ongoing 79% on 06/01/22  5.  Pt will perform basic cooking task with min A demonstrating good safety awareness  Goal status: INITIAL  6.  Pt will demonstrate improved LUE fine motor coordination as evidenced by placing 9 pegs in 2 mins. Baseline: 7 pegs in 2 mins Goal status: ongoing 4 pegs in 2 mins   7. Pt  will demonstrate improved fine motor coordination as evidenced by decreasing 9 hole peg test score to 90 secs or less RUE  Baseline: 1 min 43 secs initial   Goal status:ongoing , 91.53  ASSESSMENT:  CLINICAL IMPRESSION:  Pt is progressing slowly towards goals.  She demonstrates improving  scanning ability, however she demonstrates significant coordination deficits.  PERFORMANCE DEFICITS in functional skills including ADLs, IADLs, coordination, dexterity, proprioception, sensation, strength, FMC, GMC, mobility, balance, endurance, decreased knowledge of precautions, decreased knowledge of use of DME, vision, and UE functional use, cognitive skills including attention, learn, memory, perception, problem solving, safety awareness, sequencing, thought, and understand, and psychosocial skills including coping strategies, environmental adaptation, habits, interpersonal interactions, and routines and behaviors.   IMPAIRMENTS are limiting patient from ADLs, IADLs, play, leisure, and social participation.   COMORBIDITIES may have co-morbidities  that affects occupational performance. Patient will benefit from skilled OT to address above impairments and improve overall function.  MODIFICATION OR ASSISTANCE TO COMPLETE EVALUATION: No modification of tasks or assist necessary to complete an evaluation.  OT OCCUPATIONAL PROFILE AND HISTORY: Detailed assessment: Review of records and additional review of physical, cognitive, psychosocial history related to current functional performance.  CLINICAL DECISION MAKING: LOW - limited treatment options, no task modification necessary  REHAB POTENTIAL: Good  EVALUATION COMPLEXITY: Low   PLAN: OT FREQUENCY: 2x/week  OT DURATION: 12 weeks plus eval  PLANNED INTERVENTIONS: self care/ADL training, therapeutic exercise, therapeutic activity, neuromuscular re-education, manual therapy, passive range of motion, gait training, balance training, functional mobility training, aquatic therapy, fluidotherapy, moist heat, cryotherapy, patient/family education, cognitive remediation/compensation, visual/perceptual remediation/compensation, energy conservation, coping strategies training, DME and/or AE instructions, and Re-evaluation  RECOMMENDED OTHER  SERVICES: PT, ST  CONSULTED AND AGREED WITH PLAN OF CARE: Patient and family member/caregiver  PLAN FOR NEXT SESSION:    LUE functional use, how to orient a shirt for dressing,simple home management/ snack prep   Allani Reber, OTR/L 06/06/2022, 9:41 AM 9:41 AM 06/06/22

## 2022-06-07 ENCOUNTER — Inpatient Hospital Stay: Payer: Medicare PPO | Admitting: Nurse Practitioner

## 2022-06-08 ENCOUNTER — Ambulatory Visit: Payer: Medicare PPO | Admitting: Occupational Therapy

## 2022-06-08 ENCOUNTER — Ambulatory Visit: Payer: Medicare PPO

## 2022-06-08 ENCOUNTER — Encounter: Payer: Medicare PPO | Admitting: Speech Pathology

## 2022-06-13 ENCOUNTER — Ambulatory Visit: Payer: Medicare PPO | Admitting: Occupational Therapy

## 2022-06-13 ENCOUNTER — Encounter: Payer: Medicare PPO | Admitting: Speech Pathology

## 2022-06-15 ENCOUNTER — Encounter: Payer: Medicare PPO | Admitting: Speech Pathology

## 2022-06-15 ENCOUNTER — Ambulatory Visit: Payer: Medicare PPO | Admitting: Occupational Therapy

## 2022-06-15 DIAGNOSIS — R278 Other lack of coordination: Secondary | ICD-10-CM

## 2022-06-15 DIAGNOSIS — R2681 Unsteadiness on feet: Secondary | ICD-10-CM

## 2022-06-15 DIAGNOSIS — M6281 Muscle weakness (generalized): Secondary | ICD-10-CM

## 2022-06-15 DIAGNOSIS — R414 Neurologic neglect syndrome: Secondary | ICD-10-CM

## 2022-06-15 DIAGNOSIS — R2689 Other abnormalities of gait and mobility: Secondary | ICD-10-CM

## 2022-06-15 NOTE — Therapy (Signed)
OUTPATIENT OCCUPATIONAL THERAPY NEURO TREATMENT  Patient Name: CARRISSA TAITANO MRN: [id number] DOB:1946-02-21, 76 y.o., female Today's Date: 06/15/2022  PCP: Rob Hickman primary care REFERRING PROVIDER: Dr. Naaman Plummer   OT End of Session - 06/15/22 1108     Visit Number 10    Number of Visits 25    Date for OT Re-Evaluation 07/04/22    Authorization Type [employer]    Authorization - Visit Number 10    Progress Note Due on Visit 10    OT Start Time 1020    OT Stop Time 1100    OT Time Calculation (min) 40 min                 Past Medical History:  Diagnosis Date   Allergy    Anemia    Arthritis    "joints" (09/29/2013)   Breast cancer ([place])    GERD (gastroesophageal reflux disease)    Heart murmur    History of blood transfusion    "related to chemo/breast cancer" (09/29/2013)   Hypertension    Migraines    Personal history of chemotherapy    Personal history of radiation therapy    Radiation 11/05/13-12/24/13   Right breast    TMJ (temporomandibular joint syndrome)    "left; just dx'd" (09/29/2013   Past Surgical History:  Procedure Laterality Date   AXILLARY LYMPH NODE BIOPSY Right 03/10/2013   Procedure: AXILLARY LYMPH NODE BIOPSY;  Surgeon: Adin Hector, MD;  Location: [place];  Service: General;  Laterality: Right;  sentinel node with blue dye   BREAST BIOPSY     BREAST LUMPECTOMY Right 09/29/2013   needle localization w/axillary LND/notes 09/29/2013   BREAST LUMPECTOMY WITH NEEDLE LOCALIZATION AND AXILLARY LYMPH NODE DISSECTION Right 09/29/2013   Procedure: RIGHT BREAST NEEDLE LOCALIZED LUMPECTOMY AND AXILLARY LYMPH NODE DISSECTION;  Surgeon: Adin Hector, MD;  Location: [place];  Service: General;  Laterality: Right;   COLONOSCOPY N/A 04/30/2013   Procedure: COLONOSCOPY;  Surgeon: Wonda Horner, MD;  [facility]: [facility];  Service: Endoscopy;  Laterality: N/A;   CRANIOTOMY Right 03/11/2022   Procedure: CRANIOTOMY TUMOR EXCISION;  Surgeon: Meade Maw, MD;  Location:  [facility];  Service: Neurosurgery;  Laterality: Right;   ESOPHAGOGASTRODUODENOSCOPY N/A 04/28/2013   Procedure: ESOPHAGOGASTRODUODENOSCOPY (EGD);  Surgeon: Wonda Horner, MD;  Location: [facility];  Service: Endoscopy;  Laterality: N/A;   MASTECTOMY Right 09/29/2013   "partial"   PORT-A-CATH REMOVAL  09/29/2013   PORT-A-CATH REMOVAL Left 09/29/2013   Procedure: REMOVAL PORT-A-CATH;  Surgeon: Adin Hector, MD;  Location: [place];  Service: General;  Laterality: Left;   PORTACATH PLACEMENT N/A 03/10/2013   Procedure: INSERTION PORT-A-CATH WITH FLUOROSCOPY AND ULTRASOUND;  Surgeon: Adin Hector, MD;  Location: [place];  Service: General;  Laterality: N/A;   SURAL NERVE BX Right 05/13/2014   Procedure: SURAL NERVE BIOPSY;  Surgeon: Hosie Spangle, MD;  Location: [facility];  Service: Neurosurgery;  Laterality: Right;  Right sural nerve biopsy   TONSILLECTOMY     TOTAL ABDOMINAL HYSTERECTOMY     With bilateral salpingo-oophorectomy   Patient Active Problem List   Diagnosis Date Noted   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, right ([place]) 04/13/2022   Brain tumor ([place]) 03/17/2022   Palliative care encounter    Brain mass 03/09/2022   Seizure ([place]) 03/09/2022   Neuropathy 06/11/2014   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   Gait abnormality 01/26/2014   Unspecified hereditary and idiopathic peripheral neuropathy 01/26/2014  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[place]) 01/20/2014   Neuropathic pain 01/20/2014   Anxiety 01/20/2014   Unspecified deficiency anemia 01/20/2014   Hx of neutropenia 08/04/2013   Jaw pain 08/04/2013   Leukocytosis 04/27/2013   Pericardial effusion 04/27/2013   Cancer of central portion of right female breast ([place]) 02/04/2013   Need for influenza vaccination 11/29/2011   Hypertriglyceridemia 03/07/2011   Post-menopausal 03/07/2011   Anemia 12/17/2010   Heart murmur 12/17/2010   Seasonal allergies 12/17/2010   HYPOKALEMIA 02/19/2009    Essential hypertension, benign 04/09/2008    ONSET DATE: 03/11/22, craniotomy  REFERRING DIAG: D49.6 (ICD-10-CM) - Brain tumor ([place])  THERAPY DIAG:  Other abnormalities of gait and mobility  Other lack of coordination  Muscle weakness (generalized)  Unsteadiness on feet  Neurologic neglect syndrome  Rationale for Evaluation and Treatment Rehabilitation  SUBJECTIVE:   SUBJECTIVE STATEMENT:  Pt reports breast pain  PERTINENT HISTORY:  PMH significant for breast CA in remission for 7 years,  status post lobectomy, chemotherapy, radiation, hypertension, hypothyroidism, severe peripheral neuropathy status post IVIG, hx of seizure. 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 Pt was seen on CIR and d/c home 03/27/22  PRECAUTIONS: Fall  WEIGHT BEARING RESTRICTIONS No  PAIN:  Are you having pain? No   PATIENT GOALS to be more independent  OBJECTIVE:   Per initial OT Assessment: HAND DOMINANCE: Right  HAND FUNCTION: Grip strength: Right: 36.5 lbs; Left: 41.0 lbs  COORDINATION: 9 Hole Peg test: Right: 1 min 43 secs  Left: 7pegs in 2 min   Box and Blocks:  Right 37 blocks, Left  20 blocks   VISION ASSESSMENT: Visual Fields: per chart left visual field deficit,  pt's dtr reports L neglect  PERCEPTION: Impaired: Inattention/neglect: does not attend to left side of body   TODAY'S TREATMENT:   Pt was educated regarding Red putty HEP. She performed with bilateral UE's with min difficulty/ v.c for bilateral UE's Completing 12 piece puzzle for visual scanning and coordination, mod difficulty and v.c increased time required.  HOME EXERCISE PROGRAM: 05/02/22: Visual scanning strategies 05/04/22: Coordination HEP 05/23/22: Continue with visual scanning activity at home, word finds and coordination HEP. 06/07/22- red putty HEP.  GOALS:   SHORT TERM GOALS: Target date:  06/04/22  I with initial HEP Goal status: met, performing coordination activities at home  2.  Pt  will perform dressing with supervision only Goal status: met supervision  3.  Pt will verbalize understanding of compensatory strategies for visual deficits/ left neglect and sensory deficits. Goal status: IN PROGRESS, needs continued reinforcement, education provided, but pt is unable to state  4.  Pt will demonstrate improved bilateral UE functional use as evidenced by increasing box/ blocks score by 5 blocks bilaterally. Baseline: Box and Blocks:  Right 37 blocks, Left  20 blocks Goal status: ongoing 05/30/22:  R-29 blocks, L-blocks  5.  Pt will perform functional tabletop scanning activities with 90% or better accuracy Goal status:  in progress, 100% on 06/01/22 for  41M number cancellation however still difficulty with copy peg design  6.  Improve grip strength Rt hand by 5 lbs Baseline: Rt = 36.5 lbs, Lt = 41.0 LBS Goal status: IN PROGRESS  06/15/22-41 lbs, improved but not completely met    LONG TERM GOALS: Target date: 07/04/22  I with updated HEP. Goal status: INITIAL  2.  Pt will perform all basic ADLs mod I Goal status:ongoing supervision for bathing, mod I to supervision for dressing  3.  Pt will perform basic home management with supervision demonstrating good safety awareness. Goal status: ongoing loads laundry  4.  Pt will perform environmental scanning in a mod distracting environment with 90% accuracy and no LOB. Goal status: ongoing 79% on 06/01/22  5.  Pt will perform basic cooking task with min A demonstrating good safety awareness  Goal status ongoing 6.  Pt will demonstrate improved LUE fine motor coordination as evidenced by placing 9 pegs in 2 mins. Baseline: 7 pegs in 2 mins Goal status: ongoing 4 pegs in 2 mins   7. Pt  will demonstrate improved fine motor coordination as evidenced by decreasing 9 hole peg test score to 90 secs or less RUE  Baseline: 1 min 43 secs initial   Goal status:ongoing , 91.53  ASSESSMENT:  CLINICAL IMPRESSION:  Pt is  progressing slowly towards goals.  She demonstrates improving scanning ability, however she demonstrates significant coordination deficits.  PERFORMANCE DEFICITS in functional skills including ADLs, IADLs, coordination, dexterity, proprioception, sensation, strength, FMC, GMC, mobility, balance, endurance, decreased knowledge of precautions, decreased knowledge of use of DME, vision, and UE functional use, cognitive skills including attention, learn, memory, perception, problem solving, safety awareness, sequencing, thought, and understand, and psychosocial skills including coping strategies, environmental adaptation, habits, interpersonal interactions, and routines and behaviors.   IMPAIRMENTS are limiting patient from ADLs, IADLs, play, leisure, and social participation.   COMORBIDITIES may have co-morbidities  that affects occupational performance. Patient will benefit from skilled OT to address above impairments and improve overall function.  MODIFICATION OR ASSISTANCE TO COMPLETE EVALUATION: No modification of tasks or assist necessary to complete an evaluation.  OT OCCUPATIONAL PROFILE AND HISTORY: Detailed assessment: Review of records and additional review of physical, cognitive, psychosocial history related to current functional performance.  CLINICAL DECISION MAKING: LOW - limited treatment options, no task modification necessary  REHAB POTENTIAL: Good  EVALUATION COMPLEXITY: Low   PLAN: OT FREQUENCY: 2x/week  OT DURATION: 12 weeks plus eval  PLANNED INTERVENTIONS: self care/ADL training, therapeutic exercise, therapeutic activity, neuromuscular re-education, manual therapy, passive range of motion, gait training, balance training, functional mobility training, aquatic therapy, fluidotherapy, moist heat, cryotherapy, patient/family education, cognitive remediation/compensation, visual/perceptual remediation/compensation, energy conservation, coping strategies training, DME and/or AE  instructions, and Re-evaluation  RECOMMENDED OTHER SERVICES: PT, ST  CONSULTED AND AGREED WITH PLAN OF CARE: Patient and family member/caregiver  PLAN FOR NEXT SESSION:     Schedule more visits LUE functional use simple home management/ snack prep   Doniel Maiello, OTR/L 06/15/2022, 11:09 AM 11:09 AM 06/15/22

## 2022-06-15 NOTE — Patient Instructions (Signed)
1. Grip Strengthening (Resistive Putty)   Squeeze putty using thumb and all fingers. Repeat _20___ times. Do __2__ sessions per day.   2. Roll putty into tube on table and pinch between each finger and thumb x 10 reps each. (can do ring and small finger together)     Copyright  [employer]. All rights reserved.   

## 2022-06-20 ENCOUNTER — Ambulatory Visit: Payer: Medicare PPO | Admitting: Occupational Therapy

## 2022-06-20 ENCOUNTER — Encounter: Payer: Medicare PPO | Admitting: Speech Pathology

## 2022-06-20 DIAGNOSIS — R208 Other disturbances of skin sensation: Secondary | ICD-10-CM

## 2022-06-20 DIAGNOSIS — R2689 Other abnormalities of gait and mobility: Secondary | ICD-10-CM

## 2022-06-20 DIAGNOSIS — R2681 Unsteadiness on feet: Secondary | ICD-10-CM

## 2022-06-20 DIAGNOSIS — R278 Other lack of coordination: Secondary | ICD-10-CM | POA: Diagnosis not present

## 2022-06-20 DIAGNOSIS — M6281 Muscle weakness (generalized): Secondary | ICD-10-CM

## 2022-06-20 DIAGNOSIS — R414 Neurologic neglect syndrome: Secondary | ICD-10-CM

## 2022-06-20 NOTE — Therapy (Signed)
OUTPATIENT OCCUPATIONAL THERAPY NEURO TREATMENT  Patient Name: Tabitha Ramirez MRN: [id number] DOB:[birth year], 76 y.o., female Today's Date: 06/20/2022  PCP: Rob Hickman primary care REFERRING PROVIDER: Dr. Naaman Plummer   OT End of Session - 06/20/22 1055     Visit Number 11    Number of Visits 25    Date for OT Re-Evaluation 07/04/22    Authorization Type [employer]    Authorization - Visit Number 11    Progress Note Due on Visit 20    OT Start Time 1018    OT Stop Time 1100    OT Time Calculation (min) 42 min                  Past Medical History:  Diagnosis Date   Allergy    Anemia    Arthritis    "joints" (09/29/2013)   Breast cancer ([place])    GERD (gastroesophageal reflux disease)    Heart murmur    History of blood transfusion    "related to chemo/breast cancer" (09/29/2013)   Hypertension    Migraines    Personal history of chemotherapy    Personal history of radiation therapy    Radiation 11/05/13-12/24/13   Right breast    TMJ (temporomandibular joint syndrome)    "left; just dx'd" (09/29/2013   Past Surgical History:  Procedure Laterality Date   AXILLARY LYMPH NODE BIOPSY Right 03/10/2013   Procedure: AXILLARY LYMPH NODE BIOPSY;  Surgeon: Adin Hector, MD;  Location: [place];  Service: General;  Laterality: Right;  sentinel node with blue dye   BREAST BIOPSY     BREAST LUMPECTOMY Right 09/29/2013   needle localization w/axillary LND/notes 09/29/2013   BREAST LUMPECTOMY WITH NEEDLE LOCALIZATION AND AXILLARY LYMPH NODE DISSECTION Right 09/29/2013   Procedure: RIGHT BREAST NEEDLE LOCALIZED LUMPECTOMY AND AXILLARY LYMPH NODE DISSECTION;  Surgeon: Adin Hector, MD;  Location: [place];  Service: General;  Laterality: Right;   COLONOSCOPY N/A 04/30/2013   Procedure: COLONOSCOPY;  Surgeon: Wonda Horner, MD;  [facility];  Service: Endoscopy;  Laterality: N/A;   CRANIOTOMY Right 03/11/2022   Procedure: CRANIOTOMY TUMOR EXCISION;  Surgeon: Meade Maw, MD;  Location:  [facility];  Service: Neurosurgery;  Laterality: Right;   ESOPHAGOGASTRODUODENOSCOPY N/A 04/28/2013   Procedure: ESOPHAGOGASTRODUODENOSCOPY (EGD);  Surgeon: Wonda Horner, MD;  Location: [facility];  Service: Endoscopy;  Laterality: N/A;   MASTECTOMY Right 09/29/2013   "partial"   PORT-A-CATH REMOVAL  09/29/2013   PORT-A-CATH REMOVAL Left 09/29/2013   Procedure: REMOVAL PORT-A-CATH;  Surgeon: Adin Hector, MD;  Location: [place];  Service: General;  Laterality: Left;   PORTACATH PLACEMENT N/A 03/10/2013   Procedure: INSERTION PORT-A-CATH WITH FLUOROSCOPY AND ULTRASOUND;  Surgeon: Adin Hector, MD;  Location: [place];  Service: General;  Laterality: N/A;   SURAL NERVE BX Right 05/13/2014   Procedure: SURAL NERVE BIOPSY;  Surgeon: Hosie Spangle, MD;  [facility];  Service: Neurosurgery;  Laterality: Right;  Right sural nerve biopsy   TONSILLECTOMY     TOTAL ABDOMINAL HYSTERECTOMY     With bilateral salpingo-oophorectomy   Patient Active Problem List   Diagnosis Date Noted   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, right ([place]) 04/13/2022   Brain tumor ([place]) 03/17/2022   Palliative care encounter    Brain mass 03/09/2022   Seizure ([place]) 03/09/2022   Neuropathy 06/11/2014   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   Gait abnormality 01/26/2014   Unspecified hereditary and idiopathic peripheral neuropathy  01/26/2014   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[place]) 01/20/2014   Neuropathic pain 01/20/2014   Anxiety 01/20/2014   Unspecified deficiency anemia 01/20/2014   Hx of neutropenia 08/04/2013   Jaw pain 08/04/2013   Leukocytosis 04/27/2013   Pericardial effusion 04/27/2013   Cancer of central portion of right female breast ([place]) 02/04/2013   Need for influenza vaccination 11/29/2011   Hypertriglyceridemia 03/07/2011   Post-menopausal 03/07/2011   Anemia 12/17/2010   Heart murmur 12/17/2010   Seasonal allergies 12/17/2010   HYPOKALEMIA 02/19/2009    Essential hypertension, benign 04/09/2008    ONSET DATE: 03/11/22, craniotomy  REFERRING DIAG: D49.6 (ICD-10-CM) - Brain tumor ([place])  THERAPY DIAG:  Other lack of coordination  Muscle weakness (generalized)  Unsteadiness on feet  Neurologic neglect syndrome  Other disturbances of skin sensation  Other abnormalities of gait and mobility  Rationale for Evaluation and Treatment Rehabilitation  SUBJECTIVE:   SUBJECTIVE STATEMENT:  Pt reports breast pain  PERTINENT HISTORY:  PMH significant for breast CA in remission for 7 years,  status post lobectomy, chemotherapy, radiation, hypertension, hypothyroidism, severe peripheral neuropathy status post IVIG, hx of seizure. 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 Pt was seen on CIR and d/c home 03/27/22  PRECAUTIONS: Fall  WEIGHT BEARING RESTRICTIONS No  PAIN:  Are you having pain? 3/10, breast, alleviating and aggravating factors are unknown   PATIENT GOALS to be more independent  OBJECTIVE:   Per initial OT Assessment: HAND DOMINANCE: Right  HAND FUNCTION: Grip strength: Right: 36.5 lbs; Left: 41.0 lbs  COORDINATION: 9 Hole Peg test: Right: 1 min 43 secs  Left: 7pegs in 2 min   Box and Blocks:  Right 37 blocks, Left  20 blocks   VISION ASSESSMENT: Visual Fields: per chart left visual field deficit,  pt's dtr reports L neglect  PERCEPTION: Impaired: Inattention/neglect: does not attend to left side of body   TODAY'S TREATMENT:  Pt performed sandwich prep, ambulating with a cane, she located and retrieved items with min v.c for locating and safety, pt. carried them to the table and she made a peanut butter sandwich with supervision/ min v.c for performance and increased time. Discussion with pt regarding driving that she will need MD clearance and therapist does not recommend driving currently due to visual deficits. Placing medium pegs into pegboard with R and left UE's for increased fine motor coordination,  min difficulty/ Min v.c Folding towels and washcloths with bilateral UE's, occasional min difficulty.   HOME EXERCISE PROGRAM: 05/02/22: Visual scanning strategies 05/04/22: Coordination HEP 05/23/22: Continue with visual scanning activity at home, word finds and coordination HEP. 06/07/22- red putty HEP.  GOALS:   SHORT TERM GOALS: Target date:  06/04/22  I with initial HEP Goal status: met, performing coordination activities at home  2.  Pt will perform dressing with supervision only Goal status: met supervision  3.  Pt will verbalize understanding of compensatory strategies for visual deficits/ left neglect and sensory deficits. Goal status: IN PROGRESS, needs continued reinforcement, education provided, but pt is unable to state  4.  Pt will demonstrate improved bilateral UE functional use as evidenced by increasing box/ blocks score by 5 blocks bilaterally. Baseline: Box and Blocks:  Right 37 blocks, Left  20 blocks Goal status: ongoing 05/30/22:  R-29 blocks, L-blocks  5.  Pt will perform functional tabletop scanning activities with 90% or better accuracy Goal status:  in progress, 100% on 06/01/22 for  23M number cancellation however still difficulty with copy peg  design  6.  Improve grip strength Rt hand by 5 lbs Baseline: Rt = 36.5 lbs, Lt = 41.0 LBS Goal status: IN PROGRESS  06/15/22-41 lbs, improved but not completely met    LONG TERM GOALS: Target date: 07/04/22  I with updated HEP. Goal status: INITIAL  2.  Pt will perform all basic ADLs mod I Goal status:ongoing supervision for bathing, mod I to supervision for dressing  3.  Pt will perform basic home management with supervision demonstrating good safety awareness. Goal status: ongoing loads laundry  4.  Pt will perform environmental scanning in a mod distracting environment with 90% accuracy and no LOB. Goal status: ongoing 79% on 06/01/22  5.  Pt will perform basic cooking task with min A demonstrating good safety  awareness  Goal status ongoing, pt reports heating soup with dtr, pt made a peanut butter sandwich with supervision-min A 6.  Pt will demonstrate improved LUE fine motor coordination as evidenced by placing 9 pegs in 2 mins. Baseline: 7 pegs in 2 mins Goal status: ongoing 4 pegs in 2 mins   7. Pt  will demonstrate improved fine motor coordination as evidenced by decreasing 9 hole peg test score to 90 secs or less RUE  Baseline: 1 min 43 secs initial   Goal status:ongoing , 91.53  ASSESSMENT:  CLINICAL IMPRESSION:  Pt is progressing towards goals. Pt would like to have her dtr come in Thursday before she schedules more visits.Pt is progressing towards goal for simple snack prep.  PERFORMANCE DEFICITS in functional skills including ADLs, IADLs, coordination, dexterity, proprioception, sensation, strength, FMC, GMC, mobility, balance, endurance, decreased knowledge of precautions, decreased knowledge of use of DME, vision, and UE functional use, cognitive skills including attention, learn, memory, perception, problem solving, safety awareness, sequencing, thought, and understand, and psychosocial skills including coping strategies, environmental adaptation, habits, interpersonal interactions, and routines and behaviors.   IMPAIRMENTS are limiting patient from ADLs, IADLs, play, leisure, and social participation.   COMORBIDITIES may have co-morbidities  that affects occupational performance. Patient will benefit from skilled OT to address above impairments and improve overall function.  MODIFICATION OR ASSISTANCE TO COMPLETE EVALUATION: No modification of tasks or assist necessary to complete an evaluation.  OT OCCUPATIONAL PROFILE AND HISTORY: Detailed assessment: Review of records and additional review of physical, cognitive, psychosocial history related to current functional performance.  CLINICAL DECISION MAKING: LOW - limited treatment options, no task modification necessary  REHAB  POTENTIAL: Good  EVALUATION COMPLEXITY: Low   PLAN: OT FREQUENCY: 2x/week  OT DURATION: 12 weeks plus eval  PLANNED INTERVENTIONS: self care/ADL training, therapeutic exercise, therapeutic activity, neuromuscular re-education, manual therapy, passive range of motion, gait training, balance training, functional mobility training, aquatic therapy, fluidotherapy, moist heat, cryotherapy, patient/family education, cognitive remediation/compensation, visual/perceptual remediation/compensation, energy conservation, coping strategies training, DME and/or AE instructions, and Re-evaluation  RECOMMENDED OTHER SERVICES: PT, ST  CONSULTED AND AGREED WITH PLAN OF CARE: Patient and family member/caregiver  PLAN FOR NEXT SESSION:  check goals, discuss potential d/c with pt / dtr     Taydem Cavagnaro, OTR/L 06/20/2022, 10:55 AM 10:55 AM 06/20/22

## 2022-06-21 ENCOUNTER — Other Ambulatory Visit: Payer: Self-pay | Admitting: Nurse Practitioner

## 2022-06-21 DIAGNOSIS — G893 Neoplasm related pain (acute) (chronic): Secondary | ICD-10-CM

## 2022-06-21 DIAGNOSIS — C50911 Malignant neoplasm of unspecified site of right female breast: Secondary | ICD-10-CM

## 2022-06-21 DIAGNOSIS — Z515 Encounter for palliative care: Secondary | ICD-10-CM

## 2022-06-21 MED ORDER — OXYCODONE-ACETAMINOPHEN 5-325 MG PO TABS: 1.0000 | ORAL_TABLET | Freq: Three times a day (TID) | ORAL | 0 refills | Status: DC | PRN

## 2022-06-22 ENCOUNTER — Ambulatory Visit: Payer: Medicare PPO | Admitting: Occupational Therapy

## 2022-06-22 ENCOUNTER — Encounter: Payer: Medicare PPO | Admitting: Speech Pathology

## 2022-06-22 DIAGNOSIS — M6281 Muscle weakness (generalized): Secondary | ICD-10-CM

## 2022-06-22 DIAGNOSIS — R2681 Unsteadiness on feet: Secondary | ICD-10-CM

## 2022-06-22 DIAGNOSIS — R278 Other lack of coordination: Secondary | ICD-10-CM | POA: Diagnosis not present

## 2022-06-22 DIAGNOSIS — R414 Neurologic neglect syndrome: Secondary | ICD-10-CM

## 2022-06-22 DIAGNOSIS — R208 Other disturbances of skin sensation: Secondary | ICD-10-CM

## 2022-06-22 DIAGNOSIS — R2689 Other abnormalities of gait and mobility: Secondary | ICD-10-CM

## 2022-06-22 NOTE — Therapy (Signed)
OUTPATIENT OCCUPATIONAL THERAPY NEURO TREATMENT  Patient Name: Tabitha Ramirez MRN: [id number] DOB:06-21-1946, 76 y.o., female Today's Date: 06/23/2022  PCP: [facility] primary care REFERRING PROVIDER: Dr. Naaman Plummer  OCCUPATIONAL THERAPY DISCHARGE SUMMARY    Current functional level related to goals / functional outcomes: Pt made overall progress towards goals, however she did not fully achieve all goals due to severity of deficits.   Remaining deficits: Decreased coordination, cognitive deficits, decreased balance, decreased functional mobility, visual deficits    Education / Equipment: Pt was instructed in HEP and safety for  home management/ kitchen tasks. Pt verbalized understanding.  Patient agrees to discharge. Patient goals were partially met. Patient is being discharged due to the patient's request..      OT End of Session - 06/23/22 1412     Visit Number 12    Number of Visits 25    Date for OT Re-Evaluation 07/04/22    Authorization Type [employer]    Authorization - Visit Number 12    Progress Note Due on Visit 20    OT Start Time 2694    OT Stop Time 1056    OT Time Calculation (min) 38 min                   Past Medical History:  Diagnosis Date   Allergy    Anemia    Arthritis    "joints" (09/29/2013)   Breast cancer (HCC)    GERD (gastroesophageal reflux disease)    Heart murmur    History of blood transfusion    "related to chemo/breast cancer" (09/29/2013)   Hypertension    Migraines    Personal history of chemotherapy    Personal history of radiation therapy    Radiation 11/05/13-12/24/13   Right breast    TMJ (temporomandibular joint syndrome)    "left; just dx'd" (09/29/2013   Past Surgical History:  Procedure Laterality Date   AXILLARY LYMPH NODE BIOPSY Right 03/10/2013   Procedure: AXILLARY LYMPH NODE BIOPSY;  Surgeon: Adin Hector, MD;  Location: [place] [facility];  Service: General;  Laterality: Right;  sentinel node with blue dye   BREAST BIOPSY      BREAST LUMPECTOMY Right 09/29/2013   needle localization w/axillary LND/notes 09/29/2013   BREAST LUMPECTOMY WITH NEEDLE LOCALIZATION AND AXILLARY LYMPH NODE DISSECTION Right 09/29/2013   Procedure: RIGHT BREAST NEEDLE LOCALIZED LUMPECTOMY AND AXILLARY LYMPH NODE DISSECTION;  Surgeon: Adin Hector, MD;  Location: [facility];  Service: General;  Laterality: Right;   COLONOSCOPY N/A 04/30/2013   Procedure: COLONOSCOPY;  Surgeon: Wonda Horner, MD;  Location: [facility];  Service: Endoscopy;  Laterality: N/A;   CRANIOTOMY Right 03/11/2022   Procedure: CRANIOTOMY TUMOR EXCISION;  Surgeon: Meade Maw, MD;  Location: [facility];  Service: Neurosurgery;  Laterality: Right;   ESOPHAGOGASTRODUODENOSCOPY N/A 04/28/2013   Procedure: ESOPHAGOGASTRODUODENOSCOPY (EGD);  Surgeon: Wonda Horner, MD;  Location: [facility];  Service: Endoscopy;  Laterality: N/A;   MASTECTOMY Right 09/29/2013   "partial"   PORT-A-CATH REMOVAL  09/29/2013   PORT-A-CATH REMOVAL Left 09/29/2013   Procedure: REMOVAL PORT-A-CATH;  Surgeon: Adin Hector, MD;  Location: [facility];  Service: General;  Laterality: Left;   PORTACATH PLACEMENT N/A 03/10/2013   Procedure: INSERTION PORT-A-CATH WITH FLUOROSCOPY AND ULTRASOUND;  Surgeon: Adin Hector, MD;  Location: [place];  Service: General;  Laterality: N/A;   SURAL NERVE BX Right 05/13/2014   Procedure: SURAL NERVE BIOPSY;  Surgeon: Hosie Spangle, MD;  Location: [facility];  Service: Neurosurgery;  Laterality: Right;  Right sural nerve biopsy   TONSILLECTOMY     TOTAL ABDOMINAL HYSTERECTOMY     With bilateral salpingo-oophorectomy   Patient Active Problem List   Diagnosis Date Noted   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, right ([place]) 04/13/2022   Brain tumor ([place]) 03/17/2022   Palliative care encounter    Brain mass 03/09/2022   Seizure ([place]) 03/09/2022   Neuropathy 06/11/2014   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   Gait abnormality 01/26/2014    Unspecified hereditary and idiopathic peripheral neuropathy 01/26/2014   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[place]) 01/20/2014   Neuropathic pain 01/20/2014   Anxiety 01/20/2014   Unspecified deficiency anemia 01/20/2014   Hx of neutropenia 08/04/2013   Jaw pain 08/04/2013   Leukocytosis 04/27/2013   Pericardial effusion 04/27/2013   Cancer of central portion of right female breast ([place]) 02/04/2013   Need for influenza vaccination 11/29/2011   Hypertriglyceridemia 03/07/2011   Post-menopausal 03/07/2011   Anemia 12/17/2010   Heart murmur 12/17/2010   Seasonal allergies 12/17/2010   HYPOKALEMIA 02/19/2009   Essential hypertension, benign 04/09/2008    ONSET DATE: 03/11/22, craniotomy  REFERRING DIAG: D49.6 (ICD-10-CM) - Brain tumor ([place])  THERAPY DIAG:  Muscle weakness (generalized)  Other lack of coordination  Unsteadiness on feet  Neurologic neglect syndrome  Other disturbances of skin sensation  Other abnormalities of gait and mobility  Rationale for Evaluation and Treatment Rehabilitation  SUBJECTIVE:   SUBJECTIVE STATEMENT:  Pt reports breast pain  PERTINENT HISTORY:  PMH significant for breast CA in remission for 7 years,  status post lobectomy, chemotherapy, radiation, hypertension, hypothyroidism, severe peripheral neuropathy status post IVIG, hx of seizure. 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 Pt was seen on CIR and d/c home 03/27/22  PRECAUTIONS: Fall  WEIGHT BEARING RESTRICTIONS No  PAIN:  Are you having pain? 3/10, breast, alleviating and aggravating factors are unknown   PATIENT GOALS to be more independent  OBJECTIVE:   Per initial OT Assessment: HAND DOMINANCE: Right  HAND FUNCTION: Grip strength: Right: 36.5 lbs; Left: 41.0 lbs  COORDINATION: 9 Hole Peg test: Right: 1 min 43 secs  Left: 7pegs in 2 min   Box and Blocks:  Right 37 blocks, Left  20 blocks   VISION ASSESSMENT: Visual Fields: per chart left visual field deficit,   pt's dtr reports L neglect  PERCEPTION: Impaired: Inattention/neglect: does not attend to left side of body   TODAY'S TREATMENT:  Therapist checked progress towards goals. Pt requests d/c from OT, she ready for a break. Therapist has recommended that pt continues with PT for several visits to address balance and ambulation with a cane. See progress towards goals below. Environmental scanning with 90% accuracy. Pt agrees with plans for d/c. HOME EXERCISE PROGRAM: 05/02/22: Visual scanning strategies 05/04/22: Coordination HEP 05/23/22: Continue with visual scanning activity at home, word finds and coordination HEP. 06/07/22- red putty HEP.  GOALS:   SHORT TERM GOALS: Target date:  06/04/22  I with initial HEP Goal status: met, performing coordination activities at home  2.  Pt will perform dressing with supervision only Goal status: met supervision  3.  Pt will verbalize understanding of compensatory strategies for visual deficits/ left neglect and sensory deficits. Goal status:met  4.  Pt will demonstrate improved bilateral UE functional use as evidenced by increasing box/ blocks score by 5 blocks bilaterally. Baseline: Box and Blocks:  Right 37 blocks, Left  20 blocks Goal status: met R-45  L-25   5.  Pt will perform functional tabletop scanning activities with 90% or better accuracy Goal status:met  6.  Improve grip strength Rt hand by 5 lbs Baseline: Rt = 36.5 lbs, Lt = 41.0 LBS Goal status:  met 42.7    LONG TERM GOALS: Target date: 07/04/22  I with updated HEP. Goal status: met  2.  Pt will perform all basic ADLs mod I Goal status:not fully met supervision for bathing, mod I dressing   3.  Pt will perform basic home management with supervision demonstrating good safety awareness. Goal status: met loads laundry, washes dishes  4.  Pt will perform environmental scanning in a mod distracting environment with 90% accuracy and no LOB. Goal status: met 90%  5.  Pt  will perform basic cooking task with min A demonstrating good safety awareness  Goal status partially met, pt reports performing with assist., pt reports heating soup with dtr, pt made a peanut butter sandwich with supervision-min A  6.  Pt will demonstrate improved LUE fine motor coordination as evidenced by placing 9 pegs in 2 mins. Baseline: 7 pegs in 2 mins Goal status:not met 2 pegs in 2 mins  7. Pt  will demonstrate improved fine motor coordination as evidenced by decreasing 9 hole peg test score to 90 secs or less RUE  Baseline: 1 min 43 secs initial   Goal status:not met but improved 1 min 35 secs  ASSESSMENT:  CLINICAL IMPRESSION: Pt requests discharge from OT today.She made progress towards goals, however she did not fully achieve all goals due to significant coordination deficits and  PERFORMANCE DEFICITS in functional skills including ADLs, IADLs, coordination, dexterity, proprioception, sensation, strength, FMC, GMC, mobility, balance, endurance, decreased knowledge of precautions, decreased knowledge of use of DME, vision, and UE functional use, cognitive skills including attention, learn, memory, perception, problem solving, safety awareness, sequencing, thought, and understand, and psychosocial skills including coping strategies, environmental adaptation, habits, interpersonal interactions, and routines and behaviors.   IMPAIRMENTS are limiting patient from ADLs, IADLs, play, leisure, and social participation.   COMORBIDITIES may have co-morbidities  that affects occupational performance. Patient will benefit from skilled OT to address above impairments and improve overall function.  MODIFICATION OR ASSISTANCE TO COMPLETE EVALUATION: No modification of tasks or assist necessary to complete an evaluation.  OT OCCUPATIONAL PROFILE AND HISTORY: Detailed assessment: Review of records and additional review of physical, cognitive, psychosocial history related to current functional  performance.  CLINICAL DECISION MAKING: LOW - limited treatment options, no task modification necessary  REHAB POTENTIAL: Good  EVALUATION COMPLEXITY: Low   PLAN: OT FREQUENCY: 2x/week  OT DURATION: 12 weeks plus eval  PLANNED INTERVENTIONS: self care/ADL training, therapeutic exercise, therapeutic activity, neuromuscular re-education, manual therapy, passive range of motion, gait training, balance training, functional mobility training, aquatic therapy, fluidotherapy, moist heat, cryotherapy, patient/family education, cognitive remediation/compensation, visual/perceptual remediation/compensation, energy conservation, coping strategies training, DME and/or AE instructions, and Re-evaluation  RECOMMENDED OTHER SERVICES: PT, ST  CONSULTED AND AGREED WITH PLAN OF CARE: Patient and family member/caregiver  PLAN FOR NEXT SESSION: d/c OT     Bryttany Tortorelli, OTR/L 06/23/2022, 2:14 PM 2:14 PM 06/23/22

## 2022-06-26 ENCOUNTER — Encounter: Payer: Self-pay | Admitting: Occupational Therapy

## 2022-06-30 ENCOUNTER — Ambulatory Visit: Payer: Medicare PPO | Attending: Physician Assistant | Admitting: Physical Therapy

## 2022-06-30 DIAGNOSIS — M6281 Muscle weakness (generalized): Secondary | ICD-10-CM | POA: Diagnosis present

## 2022-06-30 DIAGNOSIS — R2681 Unsteadiness on feet: Secondary | ICD-10-CM | POA: Insufficient documentation

## 2022-06-30 NOTE — Therapy (Signed)
OUTPATIENT PHYSICAL THERAPY NEURO TREATMENT- DISCHARGE SUMMARY AND RECERT   Patient Name: Tabitha Ramirez MRN: [id number] DOB:1946/02/22, 76 y.o., female Today's Date: 06/30/2022   PCP: Scot Dock, [facility]  REFERRING PROVIDER: Cathlyn Parsons, PA-C   PHYSICAL THERAPY DISCHARGE SUMMARY  Visits from Start of Care: 11  Current functional level related to goals / functional outcomes: See below    Remaining deficits: Decreased activity tolerance, difficulty w/narrow BOS and single leg stance, min L inattention   Education / Equipment: HEP   Patient agrees to discharge. Patient goals were partially met. Patient is being discharged due to being pleased with the current functional level.     PT End of Session - 06/30/22 0851     Visit Number 11    Number of Visits 21   Recert   Date for PT Re-Evaluation 61/95/09   Recert   Authorization Type [employer]    Progress Note Due on Visit 10    PT Start Time 0848    PT Stop Time 0917   DC   PT Time Calculation (min) 29 min    Equipment Utilized During Treatment Gait belt    Activity Tolerance Patient tolerated treatment well    Behavior During Therapy WFL for tasks assessed/performed                     Past Medical History:  Diagnosis Date   Allergy    Anemia    Arthritis    "joints" (09/29/2013)   Breast cancer ([place])    GERD (gastroesophageal reflux disease)    Heart murmur    History of blood transfusion    "related to chemo/breast cancer" (09/29/2013)   Hypertension    Migraines    Personal history of chemotherapy    Personal history of radiation therapy    Radiation 11/05/13-12/24/13   Right breast    TMJ (temporomandibular joint syndrome)    "left; just dx'd" (09/29/2013   Past Surgical History:  Procedure Laterality Date   AXILLARY LYMPH NODE BIOPSY Right 03/10/2013   Procedure: AXILLARY LYMPH NODE BIOPSY;  Surgeon: Adin Hector, MD;  Location: [place];  Service: General;  Laterality:  Right;  sentinel node with blue dye   BREAST BIOPSY     BREAST LUMPECTOMY Right 09/29/2013   needle localization w/axillary LND/notes 09/29/2013   BREAST LUMPECTOMY WITH NEEDLE LOCALIZATION AND AXILLARY LYMPH NODE DISSECTION Right 09/29/2013   Procedure: RIGHT BREAST NEEDLE LOCALIZED LUMPECTOMY AND AXILLARY LYMPH NODE DISSECTION;  Surgeon: Adin Hector, MD;  Location: [place];  Service: General;  Laterality: Right;   COLONOSCOPY N/A 04/30/2013   Procedure: COLONOSCOPY;  Surgeon: Wonda Horner, MD;  [facility];  Service: Endoscopy;  Laterality: N/A;   CRANIOTOMY Right 03/11/2022   Procedure: CRANIOTOMY TUMOR EXCISION;  Surgeon: Meade Maw, MD;  Location: [facility];  Service: Neurosurgery;  Laterality: Right;   ESOPHAGOGASTRODUODENOSCOPY N/A 04/28/2013   Procedure: ESOPHAGOGASTRODUODENOSCOPY (EGD);  Surgeon: Wonda Horner, MD;  Location: [facility];  Service: Endoscopy;  Laterality: N/A;   MASTECTOMY Right 09/29/2013   "partial"   PORT-A-CATH REMOVAL  09/29/2013   PORT-A-CATH REMOVAL Left 09/29/2013   Procedure: REMOVAL PORT-A-CATH;  Surgeon: Adin Hector, MD;  Location: [place];  Service: General;  Laterality: Left;   PORTACATH PLACEMENT N/A 03/10/2013   Procedure: INSERTION PORT-A-CATH WITH FLUOROSCOPY AND ULTRASOUND;  Surgeon: Adin Hector, MD;  Location: [place] [facility];  Service: General;  Laterality: N/A;   SURAL NERVE BX  Right 05/13/2014   Procedure: SURAL NERVE BIOPSY;  Surgeon: Hosie Spangle, MD;  Location: [place] [facility];  Service: Neurosurgery;  Laterality: Right;  Right sural nerve biopsy   TONSILLECTOMY     TOTAL ABDOMINAL HYSTERECTOMY     With bilateral salpingo-oophorectomy   Patient Active Problem List   Diagnosis Date Noted   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, right ([place]) 04/13/2022   Brain tumor ([place]) 03/17/2022   Palliative care encounter    Brain mass 03/09/2022   Seizure ([place]) 03/09/2022   Neuropathy 06/11/2014   Mixed axonal-demyelinating  polyneuropathy 04/08/2014   Gait abnormality 01/26/2014   Unspecified hereditary and idiopathic peripheral neuropathy 01/26/2014   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[place]) 01/20/2014   Neuropathic pain 01/20/2014   Anxiety 01/20/2014   Unspecified deficiency anemia 01/20/2014   Hx of neutropenia 08/04/2013   Jaw pain 08/04/2013   Leukocytosis 04/27/2013   Pericardial effusion 04/27/2013   Cancer of central portion of right female breast ([place]) 02/04/2013   Need for influenza vaccination 11/29/2011   Hypertriglyceridemia 03/07/2011   Post-menopausal 03/07/2011   Anemia 12/17/2010   Heart murmur 12/17/2010   Seasonal allergies 12/17/2010   HYPOKALEMIA 02/19/2009   Essential hypertension, benign 04/09/2008    ONSET DATE: 03/21/2022 (referral)  REFERRING DIAG: D49.6 (ICD-10-CM) - Brain tumor ([place])   THERAPY DIAG:  Muscle weakness (generalized)  Unsteadiness on feet  Rationale for Evaluation and Treatment Rehabilitation  SUBJECTIVE:                                                                                                                                                                                             SUBJECTIVE STATEMENT: Pt reports she is doing well, has been using her Hurrycane at home instead of the walker with no issues. Feels ready to DC today.   Pt accompanied by: self  PERTINENT HISTORY: hypertension, hypothyroidism, tobacco use, peripheral neuropathy followed by Dr. 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 Neuro biopsy 04/2014 showed moderate severe loss of myelinated fiber.   PAIN:  Are you having pain? No  PRECAUTIONS: Fall  VITALS There were no vitals filed for this visit.   WEIGHT BEARING RESTRICTIONS No  FALLS: Has patient fallen in last 6 months?  No falls but has had one near miss while performing bed mobility   PATIENT GOALS "Mobility I guess" "Independence is my main thing"    OBJECTIVE:    TODAY'S TREATMENT:  Ther Act LTG Assessment   [facility] PT Assessment - 06/30/22 0856       Transfers   Five time sit to stand  comments  10.10s w/BUE support      Ambulation/Gait   Gait velocity 32.8' over 13.75s = 2.58f/s w/Hurrycane      Berg Balance Test   Sit to Stand Able to stand without using hands and stabilize independently    Standing Unsupported Able to stand safely 2 minutes    Sitting with Back Unsupported but Feet Supported on Floor or Stool Able to sit safely and securely 2 minutes    Stand to Sit Sits safely with minimal use of hands    Transfers Able to transfer safely, minor use of hands    Standing Unsupported with Eyes Closed Able to stand 10 seconds safely    Standing Unsupported with Feet Together Able to place feet together independently and stand 1 minute safely    From Standing, Reach Forward with Outstretched Arm Can reach forward >5 cm safely (2")    From Standing Position, Pick up Object from Floor Able to pick up shoe safely and easily    From Standing Position, Turn to Look Behind Over each Shoulder Looks behind from both sides and weight shifts well    Turn 360 Degrees Able to turn 360 degrees safely but slowly   5.16s   Standing Unsupported, Alternately Place Feet on Step/Stool Able to complete >2 steps/needs minimal assist    Standing Unsupported, One Foot in Front Able to take small step independently and hold 30 seconds    Standing on One Leg Tries to lift leg/unable to hold 3 seconds but remains standing independently    Total Score 44    Berg comment: Significant fall risk      Timed Up and Go Test   Normal TUG (seconds) 16.41   w/hurrycane           Gait pattern: step through pattern, decreased arm swing- Left, decreased stride length, decreased trunk rotation, trunk flexed, poor foot clearance- Right, and poor foot clearance- Left Distance walked: Various clinic distances  Assistive device utilized: WEnvironmental consultant- 2 wheeled and Hurrycane   Level of assistance: SBA Comments: Pt demonstrates adequate sequencing of cane, no instability noted     PATIENT EDUCATION: Education details: continue HEP, using cane at home, obtaining new referral for PT in future if mobility needs change, goal outcomes  Person educated: Patient Education method: Explanation and Demonstration Education comprehension: verbalized understanding   Home Exercise Program Access Code: [email or web address]: [email or web address] Date: 05/02/2022 Prepared by: LEthelene Browns Exercises - Supine Bridge  - 1 x daily - 7 x weekly - 1 sets - 10 reps - 3-5 secs hold - Single Leg Bridge  - 1 x daily - 7 x weekly - 1 sets - 10 reps - 3 sec hold - Sidelying Hip Abduction  - 1 x daily - 7 x weekly - 1 sets - 10 reps - 2 sec hold - Clamshell  - 1 x daily - 7 x weekly - 3 sets - 10 reps - 3-5 sec hold hold - Standing Hip Flexion with Counter Support  - 1 x daily - 7 x weekly - 1 sets - 10 reps - Standing Hip Extension with Counter Support  - 1 x daily - 7 x weekly - 1 sets - 10 reps - Standing Hip Abduction with Counter Support  - 1 x daily - 7 x weekly - 3 sets - 10 reps  GOALS: Goals reviewed with patient? Yes     OLD LONG TERM GOALS: Target date: 05/23/2022   Pt will be  independent with final HEP for improved strength, balance, transfers and gait.  Baseline:  Goal status: MET   2.  Pt will improve Berg score to >/= 46/56 to demonstrate decreased fall risk   Baseline: 38/56 (04/21/22), 35/56 (8/29) Goal status: IN PROGRESS   3.  Pt will ambulate >500' on level and unlevel surfaces w/LRAD mod I for return to community mobility and improved visual scanning  Baseline:  Goal status: MET   4.  Pt will improve 5 x STS to less than or equal to 15 seconds without UE support to demonstrate improved functional strength and transfer efficiency.    Baseline: 16.72s w/BUE support, 18.63 s with BUE support (8/29)  Goal status: IN PROGRESS   5.  Pt will  improve gait velocity to at least 2.7 ft/s w/LRAD for improved gait efficiency and performance at community ambulator level    Baseline: 1.82 ft/s w/RW, 2.08 ft/sec with RW (8/10), 2.4 ft/sec (8/29) Goal status: IN PROGRESS   6. Pt and daughter will verbalize fall prevention strategies to be used in the home and community environment for reduced fall risk             Baseline:             Goal Status: IN PROGRESS  NEW LONG TERM GOALS FOR UPDATED POC: Target date: 06/27/2022     1.  Pt will improve Berg score to >/= 46/56 to demonstrate decreased fall risk  Baseline: 38/56 (04/21/22), 35/56 (8/29); 44/56  Goal status: NOT MET    2.  Pt will improve 5 x STS to less than or equal to 15 seconds without UE support to demonstrate improved functional strength and transfer efficiency.    Baseline: 16.72s w/BUE support, 18.63 s with BUE support (8/29); 10/10s w/BUE support  Goal status: PARTIALLY MET   3.  Pt will improve gait velocity to at least 2.7 ft/s w/LRAD for improved gait efficiency and performance at community ambulator level    Baseline: 1.82 ft/s w/RW, 2.08 ft/sec with RW (8/10), 2.4 ft/sec (8/29); 2.39 ft/s w/hurrycane  Goal status: NOT MET   4. Pt and daughter will verbalize fall prevention strategies to be used in the home and community environment for reduced fall risk             Baseline:             Goal Status: MET  5. Pt will improve normal TUG to less than or equal to 20 seconds w/LRAD for improved functional mobility and decreased fall risk.  Baseline: 25.84s w/RW, 25 sec with RW (8/10), 23 sec with RW (8/15); 16.41s w/hurrycane  Goal status: MET     ASSESSMENT:   CLINICAL IMPRESSION: Emphasis of skilled PT session on LTG assessment and DC from PT. Pt has met 2/5 LTGs, improving her time on TUG w/cane and verbalizing fall prevention strategies at home (always using AD, having well lit spaces, etc.). Pt partially met her 5x STS goal, improving her overall time and  form but requiring BUE support. Pt also partially met her Merrilee Jansky goal, improving her score to 44/56, narrowly missing her target score. Pt demonstrates consistent gait speed regardless of which AD she uses (cane or RW), so she did not increase or decrease her speed from last assessment but is using a LRAD. Pt verbalized agreement and readiness to DC from PT and understands how to obtain new referral in future if her mobility needs change.     OBJECTIVE IMPAIRMENTS  decreased balance, decreased cognition, decreased coordination, decreased knowledge of condition, decreased knowledge of use of DME, difficulty walking, decreased safety awareness, impaired perceived functional ability, impaired sensation, and impaired vision/preception.    ACTIVITY LIMITATIONS lifting, bending, squatting, transfers, bed mobility, dressing, and caring for others   PARTICIPATION LIMITATIONS: meal prep, cleaning, laundry, medication management, personal finances, driving, shopping, community activity, and yard work   [place], Transportation, and 1 comorbidity: L inattention  are also affecting patient's functional outcome.    REHAB POTENTIAL: Good   CLINICAL DECISION MAKING: Stable/uncomplicated   EVALUATION COMPLEXITY: Low   PLAN: PT FREQUENCY: 2x/week   PT DURATION: 6 weeks   PLANNED INTERVENTIONS: Therapeutic exercises, Therapeutic activity, Neuromuscular re-education, Balance training, Gait training, Patient/Family education, Self Care, Joint mobilization, Visual/preceptual remediation/compensation, DME instructions, and Re-evaluation     Charlett Nose, PT, [place] [address] [place], [place]  [address] Phone:  [phone] Fax:  [phone] 05/30/22      11:02am

## 2022-07-05 ENCOUNTER — Ambulatory Visit: Payer: Medicare PPO | Admitting: Physical Therapy

## 2022-07-06 ENCOUNTER — Other Ambulatory Visit: Payer: Self-pay | Admitting: Nurse Practitioner

## 2022-07-06 DIAGNOSIS — C50911 Malignant neoplasm of unspecified site of right female breast: Secondary | ICD-10-CM

## 2022-07-06 DIAGNOSIS — Z515 Encounter for palliative care: Secondary | ICD-10-CM

## 2022-07-06 DIAGNOSIS — G893 Neoplasm related pain (acute) (chronic): Secondary | ICD-10-CM

## 2022-07-06 MED ORDER — OXYCODONE-ACETAMINOPHEN 5-325 MG PO TABS: 1.0000 | ORAL_TABLET | Freq: Three times a day (TID) | ORAL | 0 refills | Status: DC | PRN

## 2022-07-07 ENCOUNTER — Ambulatory Visit: Payer: Medicare PPO | Admitting: Physical Therapy

## 2022-07-14 ENCOUNTER — Other Ambulatory Visit: Payer: Self-pay

## 2022-07-14 ENCOUNTER — Telehealth: Payer: Self-pay

## 2022-07-14 DIAGNOSIS — C7931 Secondary malignant neoplasm of brain: Secondary | ICD-10-CM

## 2022-07-14 NOTE — Telephone Encounter (Signed)
Called pt to advise of MRI scheduled for 11/9. Pt states she is doing well but wanted to make sure her daughter knew about the appt also. I called and left the information briefly on daughters vm. Also, left information for daughter to call back for more information if wanted.

## 2022-07-17 ENCOUNTER — Inpatient Hospital Stay: Payer: Medicare PPO | Attending: Hematology and Oncology | Admitting: Nurse Practitioner

## 2022-07-17 VITALS — BP 123/68 | HR 78 | Temp 98.3°F | Wt 140.0 lb

## 2022-07-17 DIAGNOSIS — I10 Essential (primary) hypertension: Secondary | ICD-10-CM | POA: Insufficient documentation

## 2022-07-17 DIAGNOSIS — Z79899 Other long term (current) drug therapy: Secondary | ICD-10-CM | POA: Insufficient documentation

## 2022-07-17 DIAGNOSIS — G893 Neoplasm related pain (acute) (chronic): Secondary | ICD-10-CM | POA: Diagnosis not present

## 2022-07-17 DIAGNOSIS — Z9221 Personal history of antineoplastic chemotherapy: Secondary | ICD-10-CM | POA: Insufficient documentation

## 2022-07-17 DIAGNOSIS — Z79811 Long term (current) use of aromatase inhibitors: Secondary | ICD-10-CM | POA: Diagnosis not present

## 2022-07-17 DIAGNOSIS — Z923 Personal history of irradiation: Secondary | ICD-10-CM | POA: Diagnosis not present

## 2022-07-17 DIAGNOSIS — R530 Neoplastic (malignant) related fatigue: Secondary | ICD-10-CM

## 2022-07-17 DIAGNOSIS — Z515 Encounter for palliative care: Secondary | ICD-10-CM

## 2022-07-17 DIAGNOSIS — Z87891 Personal history of nicotine dependence: Secondary | ICD-10-CM | POA: Insufficient documentation

## 2022-07-17 DIAGNOSIS — C50111 Malignant neoplasm of central portion of right female breast: Secondary | ICD-10-CM | POA: Diagnosis present

## 2022-07-17 DIAGNOSIS — C7931 Secondary malignant neoplasm of brain: Secondary | ICD-10-CM | POA: Insufficient documentation

## 2022-07-17 DIAGNOSIS — M792 Neuralgia and neuritis, unspecified: Secondary | ICD-10-CM

## 2022-07-17 DIAGNOSIS — C50911 Malignant neoplasm of unspecified site of right female breast: Secondary | ICD-10-CM | POA: Diagnosis not present

## 2022-07-18 ENCOUNTER — Encounter: Payer: Self-pay | Admitting: Nurse Practitioner

## 2022-07-18 NOTE — Progress Notes (Signed)
[place]  Telephone:(336) [phone] Fax:(336) [phone]   Name: Tabitha Ramirez Date: 07/18/2022 MRN: [id number]  DOB: 1945/12/08  Patient Care Team: [place], [place] [facility] as PCP - General (Family Medicine) Magrinat, Virgie Dad, MD (Inactive) as Consulting Physician (Oncology) Pickenpack-Cousar, Carlena Sax, NP as Nurse Practitioner (Nurse Practitioner)    INTERVAL HISTORY: Tabitha Ramirez is a 76 y.o. 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  Palliative ask to see for symptom management and goals of care.   SOCIAL HISTORY:     reports that she has quit smoking. Her smoking use included cigarettes. She has never used smokeless tobacco. She reports that she does not drink alcohol and does not use drugs.  ADVANCE DIRECTIVES:    CODE STATUS:   PAST MEDICAL HISTORY: Past Medical History:  Diagnosis Date   Allergy    Anemia    Arthritis    "joints" (09/29/2013)   Breast cancer ([place])    GERD (gastroesophageal reflux disease)    Heart murmur    History of blood transfusion    "related to chemo/breast cancer" (09/29/2013)   Hypertension    Migraines    Personal history of chemotherapy    Personal history of radiation therapy    Radiation 11/05/13-12/24/13   Right breast    TMJ (temporomandibular joint syndrome)    "left; just dx'd" (09/29/2013    ALLERGIES:  is allergic to nsaids and penicillins.  MEDICATIONS:  Current Outpatient Medications  Medication Sig Dispense Refill   acetaminophen (TYLENOL) 325 MG tablet Take 1-2 tablets (325-650 mg total) by mouth every 4 (four) hours as needed for mild pain.     anastrozole (ARIMIDEX) 1 MG tablet Take 1 tablet (1 mg total) by mouth daily. 90 tablet 3   Cholecalciferol (D3-1000) 25 MCG (1000 UT) capsule Take 1 capsule (1,000 Units total) by mouth daily. 30 capsule 0   ferrous sulfate 325 (65 FE) MG tablet Take 1 tablet (325 mg  total) by mouth daily with breakfast. 30 tablet 3   levETIRAcetam (KEPPRA) 500 MG tablet Take 1 tablet (500 mg total) by mouth 2 (two) times daily. 120 tablet 0   levothyroxine (SYNTHROID) 50 MCG tablet Take 1 tablet (50 mcg total) by mouth daily. 30 tablet 0   LORazepam (ATIVAN) 0.5 MG tablet Take 1 tablet (0.5 mg total) by mouth as needed for anxiety (take 1 tablet 30 minutes prior to MRI scans and radiation visits, may repeat once, just prior to procedures if needed). 30 tablet 0   oxyCODONE-acetaminophen (PERCOCET/ROXICET) 5-325 MG tablet Take 1 tablet by mouth every 8 (eight) hours as needed for severe pain. 60 tablet 0   potassium chloride (KLOR-CON) 10 MEQ tablet Take 10 mEq by mouth daily. (Patient not taking: Reported on 04/21/2022)     rosuvastatin (CRESTOR) 10 MG tablet Take 1 tablet (10 mg total) by mouth daily. 30 tablet 0   vitamin B-12 (CYANOCOBALAMIN) 1000 MCG tablet Take 1 tablet (1,000 mcg total) by mouth daily. 30 tablet 0   No current facility-administered medications for this visit.    VITAL SIGNS: BP 123/68 (BP Location: Left Arm, Patient Position: Sitting)   Pulse 78   Temp 98.3 F (36.8 C) (Tympanic)   Wt 140 lb (63.5 kg)   SpO2 100%   BMI 24.03 kg/m  Filed Weights   07/17/22 1233  Weight: 140 lb (63.5 kg)    Estimated body mass index is 24.03  kg/m as calculated from the following:   Height as of 05/22/22: '5\' 4"'$  (1.626 m).   Weight as of this encounter: 140 lb (63.5 kg).   PERFORMANCE STATUS (ECOG) : 1 - Symptomatic but completely ambulatory   Physical Exam General: NAD Cardiovascular: regular rate and rhythm Pulmonary:normal breathing pattern  Extremities: no edema, no joint deformities Skin: no rashes Neurological: AAO x3, mood appropriate   IMPRESSION: [name] presents to the clinic today for symptom management follow-up. Her daughter, [name] is also present with her.  No acute distress noted.  Patient is alert and able to engage appropriately  in discussions.  Ms. Wish continues to try to remain as active as possible.  Is able to perform most ADLs with some assistance.  Appetite fluctuates daily.  Her current weight is stable at 140 pounds.  Discussed she has not lost any weight however we will continue to closely monitor.  Recommended patient continues to eat small frequent meals throughout the day versus focusing on 3 large meals.  She is drinking Ensure.  Recommended drinking 1-2 times daily.    1.  Neuropathic pain  [name] states her pain is well controlled for the most part.  Some days are better than others.  She was unable to tolerate gabapentin or Cymbalta.  Also endorses some generalized pain/discomfort.  We discussed her current regimen of oxycodone 5/325 every 8 hours as needed.  She is tolerating this well.  No concerns for constipation or drowsiness.  She does not have to take around-the-clock.  Discussed with daughter we will continue to closely monitor and make adjustments as needed.    I discussed the importance of continued conversation with family and their medical providers regarding overall plan of care and treatment options, ensuring decisions are within the context of the patients values and GOCs.  PLAN: Oxycodone/APAP 5/325 mg every 8 hours as needed for pain We will continue to closely monitor and adjust medications for symptom management. Ongoing goals of care discussions and support as needed I will plan to see patient back in 3-4 weeks in collaboration with her oncology appointments.  Daughter knows to contact our office sooner if needed.   Patient expressed understanding and was in agreement with this plan. She also understands that She can call the clinic at any time with any questions, concerns, or complaints.   Any controlled substances utilized were prescribed in the context of palliative care. PDMP has been reviewed.   Time Total: 35 min   Visit consisted of counseling and education dealing with  the complex and emotionally intense issues of symptom management and palliative care in the setting of serious and potentially life-threatening illness.Greater than 50%  of this time was spent counseling and coordinating care related to the above assessment and plan.  Alda Lea, AGPCNP-BC  Palliative Medicine Team/ [place]

## 2022-07-19 ENCOUNTER — Encounter: Payer: Medicare PPO | Admitting: Physical Medicine & Rehabilitation

## 2022-07-20 ENCOUNTER — Ambulatory Visit: Payer: Medicare PPO | Admitting: Neurosurgery

## 2022-07-20 ENCOUNTER — Encounter: Payer: Self-pay | Admitting: Neurosurgery

## 2022-07-20 VITALS — BP 139/75 | HR 81 | Ht 64.0 in | Wt 136.0 lb

## 2022-07-20 DIAGNOSIS — C7931 Secondary malignant neoplasm of brain: Secondary | ICD-10-CM | POA: Diagnosis not present

## 2022-07-20 DIAGNOSIS — C50911 Malignant neoplasm of unspecified site of right female breast: Secondary | ICD-10-CM

## 2022-07-20 MED ORDER — LEVETIRACETAM 500 MG PO TABS: 500.0000 mg | ORAL_TABLET | Freq: Two times a day (BID) | ORAL | 0 refills | Status: DC

## 2022-07-20 NOTE — Progress Notes (Signed)
   DOS: 03/11/22 (R Crani for tumor resection)  HISTORY OF PRESENT ILLNESS: 07/20/2022 [name] is status post craniotomy for tumor resection.  She has known history of breast cancer and this lesion was consistent with metastatic breast cancer.  She is doing well.  She is finished with her radiation. PHYSICAL EXAMINATION:   Vitals:   07/20/22 1353  BP: 139/75  Pulse: 81    General: Patient is well developed, well nourished, calm, collected, and in no apparent distress.  NEUROLOGICAL:  General: In no acute distress.  Awake, alert, oriented to person, place, and time. Pupils equal round and reactive to light.   Strength: Side Biceps Triceps Deltoid Interossei Grip Wrist Ext. Wrist Flex.  R '5 5 5 5 5 5 5  '$ L '5 5 5 5 5 5 5   '$ Incision c/d/i   ROS (Neurologic): Negative except as noted above  IMAGING: MRI Brain 04/25/22 IMPRESSION: 1. Evolving blood products in the right parietal resection cavity with reactive appearing peripheral enhancement. 2. No new metastatic disease.     Electronically Signed   By: Jorje Guild M.D.   On: 04/25/2022 08:02  ASSESSMENT/PLAN:  Tabitha Ramirez is doing well after craniotomy for metastatic breast tumor.  She is now on chemotherapy directed by her oncologist.  She has finished radiation therapy.  She will have serial MRI scans from here.  Have asked her to call my office whenever she has an MRI scan performed and I will be happy to review.  I will refill her Keppra for 3 months.  I have asked that she discuss that with her oncologist or rehabilitation physician moving forward.    I will see her back on an as-needed basis.    I spent a total of 15 minutes in face-to-face and non-face-to-face activities related to this patient's care today.   Meade Maw MD, [facility] Department of Neurosurgery

## 2022-07-24 ENCOUNTER — Inpatient Hospital Stay (HOSPITAL_BASED_OUTPATIENT_CLINIC_OR_DEPARTMENT_OTHER): Payer: Medicare PPO | Admitting: Nurse Practitioner

## 2022-07-24 ENCOUNTER — Encounter: Payer: Self-pay | Admitting: Nurse Practitioner

## 2022-07-24 DIAGNOSIS — Z515 Encounter for palliative care: Secondary | ICD-10-CM | POA: Diagnosis not present

## 2022-07-24 DIAGNOSIS — G893 Neoplasm related pain (acute) (chronic): Secondary | ICD-10-CM | POA: Diagnosis not present

## 2022-07-24 DIAGNOSIS — C50911 Malignant neoplasm of unspecified site of right female breast: Secondary | ICD-10-CM

## 2022-07-24 DIAGNOSIS — R530 Neoplastic (malignant) related fatigue: Secondary | ICD-10-CM

## 2022-07-24 DIAGNOSIS — C7931 Secondary malignant neoplasm of brain: Secondary | ICD-10-CM

## 2022-07-24 DIAGNOSIS — C50111 Malignant neoplasm of central portion of right female breast: Secondary | ICD-10-CM | POA: Diagnosis not present

## 2022-07-24 MED ORDER — OXYCODONE-ACETAMINOPHEN 5-325 MG PO TABS: 1.0000 | ORAL_TABLET | Freq: Three times a day (TID) | ORAL | 0 refills | Status: DC | PRN

## 2022-07-24 MED ORDER — LORAZEPAM 0.5 MG PO TABS: 0.5000 mg | ORAL_TABLET | Freq: Three times a day (TID) | ORAL | 0 refills | Status: DC | PRN

## 2022-07-24 NOTE — Progress Notes (Signed)
Tabitha Ramirez  Telephone:(336) [phone] Fax:(336) [phone]   Name: Tabitha Ramirez Date: 07/24/2022 MRN: [id number]  DOB: Apr 22, 1946  Patient Care Team: [place], [place] [facility] as PCP - General (Family Medicine) Magrinat, Virgie Dad, MD (Inactive) as Consulting Physician (Oncology) Pickenpack-Cousar, Carlena Sax, NP as Nurse Practitioner (Nurse Practitioner)   I connected with Tabitha Ramirez on 07/24/22 at 10:30 AM EDT by phone and verified that I am speaking with the correct person using two identifiers.   I discussed the limitations, risks, security and privacy concerns of performing an evaluation and management service by telemedicine and the availability of in-person appointments. I also discussed with the patient that there may be a patient responsible charge related to this service. The patient expressed understanding and agreed to proceed.   Other persons participating in the visit and their role in the encounter: [name], daughter and May[place], RN    Patient's location: Home   Provider's location: [place]    Chief Complaint: Symptom Management follow-up   INTERVAL HISTORY: Tabitha Ramirez is a 76 y.o. 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  Palliative ask to see for symptom management and goals of care.   SOCIAL HISTORY:     reports that she has quit smoking. Her smoking use included cigarettes. She has never used smokeless tobacco. She reports that she does not drink alcohol and does not use drugs.  ADVANCE DIRECTIVES:    CODE STATUS:   PAST MEDICAL HISTORY: Past Medical History:  Diagnosis Date   Allergy    Anemia    Arthritis    "joints" (09/29/2013)   Breast cancer ([place])    GERD (gastroesophageal reflux disease)    Heart murmur    History of blood transfusion    "related to chemo/breast cancer" (09/29/2013)   Hypertension    Migraines    Personal  history of chemotherapy    Personal history of radiation therapy    Radiation 11/05/13-12/24/13   Right breast    TMJ (temporomandibular joint syndrome)    "left; just dx'd" (09/29/2013    ALLERGIES:  is allergic to nsaids and penicillins.  MEDICATIONS:  Current Outpatient Medications  Medication Sig Dispense Refill   acetaminophen (TYLENOL) 325 MG tablet Take 1-2 tablets (325-650 mg total) by mouth every 4 (four) hours as needed for mild pain.     anastrozole (ARIMIDEX) 1 MG tablet Take 1 tablet (1 mg total) by mouth daily. 90 tablet 3   Cholecalciferol (D3-1000) 25 MCG (1000 UT) capsule Take 1 capsule (1,000 Units total) by mouth daily. 30 capsule 0   ferrous sulfate 325 (65 FE) MG tablet Take 1 tablet (325 mg total) by mouth daily with breakfast. 30 tablet 3   levETIRAcetam (KEPPRA) 500 MG tablet Take 1 tablet (500 mg total) by mouth 2 (two) times daily. 180 tablet 0   levothyroxine (SYNTHROID) 50 MCG tablet Take 1 tablet (50 mcg total) by mouth daily. 30 tablet 0   LORazepam (ATIVAN) 0.5 MG tablet Take 1 tablet (0.5 mg total) by mouth every 8 (eight) hours as needed for anxiety (take 1 tablet as needed for anxiety every 8 hours). 30 tablet 0   oxyCODONE-acetaminophen (PERCOCET/ROXICET) 5-325 MG tablet Take 1 tablet by mouth every 8 (eight) hours as needed for severe pain. 60 tablet 0   potassium chloride (KLOR-CON) 10 MEQ tablet Take 10 mEq by mouth daily. (Patient not taking: Reported on 04/21/2022)  rosuvastatin (CRESTOR) 10 MG tablet Take 1 tablet (10 mg total) by mouth daily. 30 tablet 0   vitamin B-12 (CYANOCOBALAMIN) 1000 MCG tablet Take 1 tablet (1,000 mcg total) by mouth daily. 30 tablet 0   No current facility-administered medications for this visit.    VITAL SIGNS: There were no vitals taken for this visit. There were no vitals filed for this visit.   Estimated body mass index is 23.34 kg/m as calculated from the following:   Height as of 07/20/22: '5\' 4"'$  (1.626 m).    Weight as of 07/20/22: 136 lb (61.7 kg).   PERFORMANCE STATUS (ECOG) : 1 - Symptomatic but completely ambulatory    IMPRESSION: I connected by phone with Tabitha Ramirez and her daughter. No acute distress noted. Daughter shares pain is well controlled. Denies constipation, diarrhea, nausea, or vomiting. Patient is remaining as active as possible. Appetite has improved.     1.  Neuropathic pain  Patient reports her pain is well controlled on current regimen. Fluctuates from day to day depending on level of activity.   Her daughter and I discussed her current regimen of oxycodone 5/325 every 8 hours as needed.  She is tolerating this well. Feels it is controlling her pain as needed.   She understands we will make no changes and continue to closely monitor.   2. Tabitha Ramirez had and anxiety episode several days prior. This is not something that happens often. She was able to overcome and take a lorazepam that she had on hand from previous prescription. No significant home changes that would exacerbate these feelings. We discussed focusing on triggers and use of lorazepam as needed.   PLAN: Oxycodone/APAP 5/325 mg every 8 hours as needed for pain Lorazepam 0.5 mg as needed for anxiety.  We will continue to closely monitor and adjust medications for symptom management. Ongoing goals of care discussions and support as needed I will plan to see patient back in 3-4 weeks in collaboration with her oncology appointments.  Daughter knows to contact our office sooner if needed.   Patient expressed understanding and was in agreement with this plan. She also understands that She can call the clinic at any time with any questions, concerns, or complaints.   Any controlled substances utilized were prescribed in the context of palliative care. PDMP has been reviewed.     Time Total: 20 min   Visit consisted of counseling and education dealing with the complex and emotionally intense issues of symptom  management and palliative care in the setting of serious and potentially life-threatening illness.Greater than 50%  of this time was spent counseling and coordinating care related to the above assessment and plan.  Alda Lea, AGPCNP-BC  Palliative Medicine Team/[place]

## 2022-07-26 NOTE — Progress Notes (Incomplete)
Patient Care Team: [place], [place] [facility] as PCP - General (Family Medicine) Magrinat, Virgie Dad, MD (Inactive) as Consulting Physician (Oncology) Pickenpack-Cousar, Carlena Sax, NP as Nurse Practitioner (Nurse Practitioner)  DIAGNOSIS: No diagnosis found.  SUMMARY OF ONCOLOGIC HISTORY: Oncology History  Cancer of central portion of right female breast (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 Ki-67 84%; sentinel lymph node sampling 03/10/2013 showed1/2 SLN pos PR 3% PR 6% Ki-67 15% HER-2 negative   02/10/2013 Breast MRI   Right breast: Rim enhancing necrotic mass 3.4 cm with a 2 cm level I axillary lymph node   03/17/2013 - 08/25/2013 Neo-Adjuvant Chemotherapy   Dose dense Adriamycin and Cytoxan Followed by weekly Taxol and carboplatin x12; 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  MEDICATIONS:  Current Outpatient Medications  Medication Sig Dispense Refill   acetaminophen (TYLENOL) 325 MG tablet Take 1-2 tablets (325-650 mg total) by mouth every 4 (four) hours as needed for mild pain.     anastrozole (ARIMIDEX) 1 MG tablet Take 1 tablet (1 mg total) by mouth daily. 90 tablet 3   Cholecalciferol (D3-1000) 25 MCG (1000 UT) capsule Take 1 capsule (1,000 Units total) by mouth daily. 30 capsule 0   ferrous  sulfate 325 (65 FE) MG tablet Take 1 tablet (325 mg total) by mouth daily with breakfast. 30 tablet 3   levETIRAcetam (KEPPRA) 500 MG tablet Take 1 tablet (500 mg total) by mouth 2 (two) times daily. 180 tablet 0   levothyroxine (SYNTHROID) 50 MCG tablet Take 1 tablet (50 mcg total) by mouth daily. 30 tablet 0   LORazepam (ATIVAN) 0.5 MG tablet Take 1 tablet (0.5 mg total) by mouth every 8 (eight) hours as needed for anxiety (take 1 tablet as needed for anxiety every 8 hours). 30 tablet 0   oxyCODONE-acetaminophen (PERCOCET/ROXICET) 5-325 MG tablet Take 1 tablet by mouth every 8 (eight) hours as needed for severe pain. 60 tablet 0   potassium chloride (KLOR-CON) 10 MEQ tablet Take 10 mEq by mouth daily. (Patient not taking: Reported on 04/21/2022)     rosuvastatin (CRESTOR) 10 MG tablet Take 1 tablet (10 mg total) by mouth daily. 30 tablet 0   vitamin B-12 (CYANOCOBALAMIN) 1000 MCG tablet Take 1 tablet (1,000 mcg total) by mouth daily. 30 tablet 0   No current facility-administered medications for this visit.    PHYSICAL EXAMINATION: ECOG PERFORMANCE STATUS: {CHL ONC ECOG [id number]}  There were no vitals filed for this visit. There were no vitals filed for this visit.  BREAST:*** No palpable masses or nodules in either right or left breasts. No palpable axillary supraclavicular or infraclavicular adenopathy no breast tenderness or nipple discharge. (exam performed in the presence of a chaperone)  LABORATORY DATA:  I have reviewed the data as listed    Latest Ref Rng &  Units 03/27/2022    5:52 AM 03/24/2022    5:14 AM 03/20/2022    6:23 AM  CMP  Glucose 70 - 99 mg/dL 94  97  104   BUN 8 - 23 mg/dL _0 Creatinine 0.44 - 1.00 mg/dL 1.03  0.85  0.95   Sodium 135 - 145 mmol/L 139  138  134   Potassium 3.5 - 5.1 mmol/L 4.0  3.9  3.9   Chloride 98 - 111 mmol/L 105  103  103   CO2 22 - 32 mmol/L _1 Calcium 8.9 - 10.3 mg/dL 9.0  8.8  8.4   Total Protein 6.5 - 8.1 g/dL    6.2   Total Bilirubin 0.3 - 1.2 mg/dL   0.5   Alkaline Phos 38 - 126 U/L   59   AST 15 - 41 U/L   28   ALT 0 - 44 U/L   42     Lab Results  Component Value Date   WBC 7.4 03/27/2022   HGB 9.1 (L) 03/27/2022   HCT 27.7 (L) 03/27/2022   MCV 102.6 (H) 03/27/2022   PLT 294 03/27/2022   NEUTROABS 6.4 03/20/2022    ASSESSMENT & PLAN:  No problem-specific Assessment & Plan notes found for this encounter.    No orders of the defined types were placed in this encounter.  The patient has a good understanding of the overall plan. she agrees with it. she will call with any problems that may develop before the next visit here. Total time spent: 30 mins including face to face time and time spent for planning, charting and co-ordination of care   Suzzette Righter, [place] 07/26/22    I Gardiner Coins am scribing for Dr. Lindi Adie  ***

## 2022-07-28 ENCOUNTER — Telehealth: Payer: Self-pay | Admitting: Hematology and Oncology

## 2022-07-28 NOTE — Telephone Encounter (Signed)
Rescheduled appointment per provider [facility]. Talked to the patients daughter and she is aware of the changes made to the patients appointment.

## 2022-08-02 ENCOUNTER — Encounter: Payer: Medicare PPO | Admitting: Nurse Practitioner

## 2022-08-02 ENCOUNTER — Ambulatory Visit: Payer: Medicare PPO | Admitting: Hematology and Oncology

## 2022-08-03 ENCOUNTER — Ambulatory Visit
Admission: RE | Admit: 2022-08-03 | Discharge: 2022-08-03 | Disposition: A | Discharge status: Home / Self Care | Payer: Medicare PPO | Source: Ambulatory Visit | Attending: Radiation Oncology | Admitting: Radiation Oncology

## 2022-08-03 DIAGNOSIS — C7931 Secondary malignant neoplasm of brain: Secondary | ICD-10-CM

## 2022-08-03 MED ORDER — GADOPICLENOL 0.5 MMOL/ML IV SOLN
7.0000 mL | Freq: Once | INTRAVENOUS | Status: AC | PRN
  Administered 2022-08-03: 7 mL via INTRAVENOUS

## 2022-08-04 ENCOUNTER — Encounter: Payer: Self-pay | Admitting: Neurosurgery

## 2022-08-05 NOTE — Progress Notes (Signed)
Patient Care Team: [place] [place] [facility] as PCP - General (Family Medicine) Magrinat, Virgie Dad, MD (Inactive) as Consulting Physician (Oncology) Pickenpack-Cousar, Carlena Sax, NP as Nurse Practitioner (Nurse Practitioner)  DIAGNOSIS: No diagnosis found.  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 Ki-67 84%; sentinel lymph node sampling 03/10/2013 showed1/2 SLN pos PR 3% PR 6% Ki-67 15% HER-2 negative   02/10/2013 Breast MRI   Right breast: Rim enhancing necrotic mass 3.4 cm with a 2 cm level I axillary lymph node   03/17/2013 - 08/25/2013 Neo-Adjuvant Chemotherapy   Dose dense Adriamycin and Cytoxan Followed by weekly Taxol and carboplatin x12; 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 with above-mentioned history of right breast cancer. currently on surveillance. She presents to the clinic today for  a follow-up after radiation.   ALLERGIES:  is allergic to nsaids and penicillins.  MEDICATIONS:  Current Outpatient Medications  Medication Sig Dispense Refill   acetaminophen (TYLENOL) 325 MG tablet Take 1-2 tablets (325-650 mg total) by mouth every 4 (four) hours as needed for mild pain.     anastrozole (ARIMIDEX) 1 MG tablet  Take 1 tablet (1 mg total) by mouth daily. 90 tablet 3   Cholecalciferol (D3-1000) 25 MCG (1000 UT) capsule Take 1 capsule (1,000 Units total) by mouth daily. 30 capsule 0   ferrous sulfate 325 (65 FE) MG tablet Take 1 tablet (325 mg total) by mouth daily with breakfast. 30 tablet 3   levETIRAcetam (KEPPRA) 500 MG tablet Take 1 tablet (500 mg total) by mouth 2 (two) times daily. 180 tablet 0   levothyroxine (SYNTHROID) 50 MCG tablet Take 1 tablet (50 mcg total) by mouth daily. 30 tablet 0   LORazepam (ATIVAN) 0.5 MG tablet Take 1 tablet (0.5 mg total) by mouth every 8 (eight) hours as needed for anxiety (take 1 tablet as needed for anxiety every 8 hours). 30 tablet 0   oxyCODONE-acetaminophen (PERCOCET/ROXICET) 5-325 MG tablet Take 1 tablet by mouth every 8 (eight) hours as needed for severe pain. 60 tablet 0   potassium chloride (KLOR-CON) 10 MEQ tablet Take 10 mEq by mouth daily. (Patient not taking: Reported on 04/21/2022)     rosuvastatin (CRESTOR) 10 MG tablet Take 1 tablet (10 mg total) by mouth daily. 30 tablet 0   vitamin B-12 (CYANOCOBALAMIN) 1000 MCG tablet Take 1 tablet (1,000 mcg total) by mouth daily. 30 tablet 0   No current facility-administered medications for this visit.    PHYSICAL EXAMINATION: ECOG PERFORMANCE STATUS: {CHL ONC ECOG [phone]}  There were no vitals filed for this visit. There were no vitals filed for this visit.  BREAST:*** No palpable masses or nodules in either right or left breasts. No palpable axillary supraclavicular or infraclavicular adenopathy  no breast tenderness or nipple discharge. (exam performed in the presence of a chaperone)  LABORATORY DATA:  I have reviewed the data as listed    Latest Ref Rng & Units 03/27/2022    5:52 AM 03/24/2022    5:14 AM 03/20/2022    6:23 AM  CMP  Glucose 70 - 99 mg/dL 94  97  104   BUN 8 - 23 mg/dL _0 Creatinine 0.44 - 1.00 mg/dL 1.03  0.85  0.95   Sodium 135 - 145 mmol/L 139  138  134   Potassium  3.5 - 5.1 mmol/L 4.0  3.9  3.9   Chloride 98 - 111 mmol/L 105  103  103   CO2 22 - 32 mmol/L _1 Calcium 8.9 - 10.3 mg/dL 9.0  8.8  8.4   Total Protein 6.5 - 8.1 g/dL   6.2   Total Bilirubin 0.3 - 1.2 mg/dL   0.5   Alkaline Phos 38 - 126 U/L   59   AST 15 - 41 U/L   28   ALT 0 - 44 U/L   42     Lab Results  Component Value Date   WBC 7.4 03/27/2022   HGB 9.1 (L) 03/27/2022   HCT 27.7 (L) 03/27/2022   MCV 102.6 (H) 03/27/2022   PLT 294 03/27/2022   NEUTROABS 6.4 03/20/2022    ASSESSMENT & PLAN:  No problem-specific Assessment & Plan notes found for this encounter.    No orders of the defined types were placed in this encounter.  The patient has a good understanding of the overall plan. she agrees with it. she will call with any problems that may develop before the next visit here. Total time spent: 30 mins including face to face time and time spent for planning, charting and co-ordination of care   Suzzette Righter, [place] 08/05/22    I Gardiner Coins am scribing for Dr. Lindi Adie  ***

## 2022-08-07 ENCOUNTER — Inpatient Hospital Stay: Payer: Medicare PPO | Attending: Hematology and Oncology | Admitting: Hematology and Oncology

## 2022-08-07 ENCOUNTER — Ambulatory Visit
Admission: RE | Admit: 2022-08-07 | Discharge: 2022-08-07 | Disposition: A | Discharge status: Home / Self Care | Payer: Medicare PPO | Source: Ambulatory Visit | Attending: Radiation Oncology | Admitting: Radiation Oncology

## 2022-08-07 VITALS — BP 121/57 | HR 76 | Temp 97.3°F | Resp 18 | Ht 64.0 in | Wt 136.5 lb

## 2022-08-07 VITALS — BP 132/78 | HR 75 | Temp 96.2°F | Resp 18 | Ht 64.0 in | Wt 136.0 lb

## 2022-08-07 DIAGNOSIS — C50911 Malignant neoplasm of unspecified site of right female breast: Secondary | ICD-10-CM | POA: Insufficient documentation

## 2022-08-07 DIAGNOSIS — C50111 Malignant neoplasm of central portion of right female breast: Secondary | ICD-10-CM | POA: Diagnosis present

## 2022-08-07 DIAGNOSIS — Z171 Estrogen receptor negative status [ER-]: Secondary | ICD-10-CM | POA: Insufficient documentation

## 2022-08-07 DIAGNOSIS — Z923 Personal history of irradiation: Secondary | ICD-10-CM | POA: Insufficient documentation

## 2022-08-07 DIAGNOSIS — G9389 Other specified disorders of brain: Secondary | ICD-10-CM

## 2022-08-07 DIAGNOSIS — C7931 Secondary malignant neoplasm of brain: Secondary | ICD-10-CM | POA: Insufficient documentation

## 2022-08-07 DIAGNOSIS — G629 Polyneuropathy, unspecified: Secondary | ICD-10-CM | POA: Insufficient documentation

## 2022-08-07 DIAGNOSIS — G8929 Other chronic pain: Secondary | ICD-10-CM | POA: Diagnosis not present

## 2022-08-07 DIAGNOSIS — Z79811 Long term (current) use of aromatase inhibitors: Secondary | ICD-10-CM | POA: Diagnosis not present

## 2022-08-07 NOTE — Progress Notes (Signed)
Location/Histology of Brain Tumor: Brain   FINDINGS: Brain:   No age advanced or lobar predominant parenchymal atrophy.   Interval evolution of postoperative changes from prior right parietal craniotomy and right parietal lobe metastasis resection. Chronic blood products and peripheral non-masslike enhancement at the resection site, as before. Interval decrease in FLAIR hyperintense signal abnormality within the resection cavity, likely due to resolving postoperative hematoma. T2 FLAIR hyperintense signal abnormality surrounding the resection cavity has increased, but no new mass effect is appreciated, and this is favored to reflect progressive post treatment changes from prior radiation therapy.   2 mm rounded focus of enhancement along the anterolateral left frontal lobe (series 14, image 88). This appears to have been present on prior examinations dating back to 03/09/2022, and is favored to reflect a focus of prominent vascular enhancement.   Moderate multifocal T2 FLAIR hyperintense signal abnormality elsewhere within the cerebral white matter, nonspecific but compatible with chronic small vessel ischemic disease.  Redemonstrated chronic lacunar infarct within the left ventral aspect of the pons. Stable mild background pontine chronic small vessel disease.   There is no acute infarct.   No extra-axial fluid collection.   No midline shift.   Vascular: Maintained flow voids within the proximal large arterial vessels.   Skull and upper cervical spine: Prior right parietal craniotomy. No focal suspicious marrow lesion. Incompletely assessed cervical spondylosis.   Sinuses/Orbits: No mass or acute finding within the imaged orbits. Small-volume frothy secretions within the right maxillary sinus. Small mucous retention cyst within the left maxillary sinus. Minimal mucosal thickening within the bilateral ethmoid air cells.   IMPRESSION: Continued evolution of postoperative changes from  prior right parietal lobe metastasis resection. No masslike or nodular enhancement at the resection site to suggest residual/recurrent tumor. T2 FLAIR hyperintense signal abnormality surrounding the resection cavity has progressed, however, no developing mass effect is appreciated and this is favored to reflect progressive post treatment changes from prior radiation therapy.   2 mm rounded focus of enhancement along the anterolateral left frontal lobe. This appears to have been present on prior examinations dating back to 03/09/2022, and is favored to reflect a focus of prominent vascular enhancement. However, continued attention is recommended on follow-up to exclude a metastasis at this site.   Background chronic small vessel ischemic changes within the supratentorial and infratentorial brain, stable.    Past or anticipated interventions, if any, per neurosurgery:   Past or anticipated interventions, if any, per medical oncology:   Dose of Decadron, if applicable: No  Recent neurologic symptoms, if any:  Seizures: No Headaches:  Yes , the other day Nausea: Yes Dizziness/ataxia: No Difficulty with hand coordination: No Focal numbness/weakness: Yes, neuropathy Visual deficits/changes: No Confusion/Memory deficits: No  Painful bone metastases at present, if any: No  SAFETY ISSUES: Prior radiation? Yes Pacemaker/ICD? No Possible current pregnancy? No Is the patient on methotrexate? No  Additional Complaints / other details:

## 2022-08-07 NOTE — Addendum Note (Signed)
Encounter addended by: [name] Perking, PA-C on: 08/07/2022 1:04 PM  Actions taken: Pend clinical note

## 2022-08-07 NOTE — Addendum Note (Signed)
Encounter addended by: [name] Perking, PA-C on: 08/07/2022 1:31 PM  Actions taken: Pend clinical note

## 2022-08-07 NOTE — Addendum Note (Signed)
Encounter addended by: Tyler Pita, MD on: 08/07/2022 12:15 PM  Actions taken: Problem List reviewed, Medication List reviewed, Allergies reviewed

## 2022-08-07 NOTE — Assessment & Plan Note (Addendum)
1. 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 N0 M0 ER PR 0% HER-2 negative status post radiation therapy   2. brain metastases: 03/09/2022: 3 x 2.3 x 1.8 cm irregular mass in the right parietal lobe with localized vasogenic edema status postcraniotomy 03/11/2022 and resection: Positive for known breast cancer (positive for CK7 and GATA 3 and negative for CK20) ER/PR negative, HER2 negative (score 0)   Breast Cancer Surveillance: 1. CT CAP 03/09/2022: No evidence of metastatic disease 2.  MRI brain 03/12/2022: Postoperative changes from right parietal craniotomy, gross total resection of the tumor no residual tumor, small acute ischemic infarct deep in the resection cavity.   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   April 2019 at [facility]: Hospitalization for pneumonia and lower extremity paralysis    Recommendation: 1.  Radiation therapy: SRS 05/03/2022 2. followed by surveillance.  Since the CT chest abdomen pelvis does not show any evidence of metastatic disease, I do not recommend systemic chemotherapy at this time.   CT CAP from 1 month and follow-up after that

## 2022-08-07 NOTE — Addendum Note (Signed)
Encounter addended by: Tyler Pita, MD on: 08/07/2022 2:07 PM  Actions taken: Problem List reviewed, Medication List reviewed, Allergies reviewed, Visit diagnoses modified, Level of Service modified, Clinical Note Signed

## 2022-08-07 NOTE — Progress Notes (Addendum)
Radiation Oncology         [phone] ________________________________   Name: Tabitha Ramirez MRN: [id number]         Date: 05/31/2022                        DOB: [birth year]   Post Treatment Note   CC: Tabitha Ramirez, [facility], [place] [facility]   Diagnosis:   76 yo Ramirez s/p resection of a solitary brain metastasis secondary to triple negative breast cancer.      Interval Since Last Radiation: 3 months 04/27/22 - 05/03/22:  Post-op fractionated SRS to the Right parietal resection cavity to 27 Gy in 3 fractions of 9 Gy each.    11/05/13 - 12/12/13:   Right breast / 45 Gray @ 1.8 Tabitha Ramirez per fraction x 25 fractions Tabitha Ramirez) Right supraclavicular fossa / 45 Gy '@1'$ .8 Gy per fraction x 25 fractions Tabitha Ramirez)   12/15/13 - 12/24/13: Right breast boost / 16 Gray at 2 Tabitha Ramirez per fraction x 8 fractions Tabitha Ramirez)   Narrative:   She tolerated radiation treatment relatively well without any ill side effects.                               On review of systems, the patient states that she is doing fairly well in general.  She does endorse some intermittent nausea. She specifically denies persistent or frequent headaches, nausea, vomiting, changes in visual or auditory acuity, new focal weakness, imbalance/dizziness, tremor or seizure activity.   Of note, she is not currently taking any steroids. She is present with her daughter today and overall, they are pleased with her progress to date.   ALLERGIES:  is allergic to nsaids and penicillins.   Meds:       Current Outpatient Medications  Medication Sig Dispense Refill   acetaminophen (TYLENOL) 325 MG tablet Take 1-2 tablets (325-650 mg total) by mouth every 4 (four) hours as needed for mild pain.       anastrozole (ARIMIDEX) 1 MG tablet Take 1 tablet (1 mg total) by mouth daily. 90 tablet 3   Cholecalciferol (D3-1000) 25 MCG (1000 UT) capsule Take 1 capsule (1,000 Units total) by mouth daily. 30 capsule 0   DULoxetine (CYMBALTA) 30 MG  capsule Take 1 capsule (30 mg total) by mouth daily. 30 capsule 0   ferrous sulfate 325 (65 FE) MG tablet Take 1 tablet (325 mg total) by mouth daily with breakfast. 30 tablet 3   levETIRAcetam (KEPPRA) 500 MG tablet Take 1 tablet (500 mg total) by mouth 2 (two) times daily. 120 tablet 0   levothyroxine (SYNTHROID) 50 MCG tablet Take 1 tablet (50 mcg total) by mouth daily. 30 tablet 0   LORazepam (ATIVAN) 0.5 MG tablet Take 1 tablet (0.5 mg total) by mouth as needed for anxiety (take 1 tablet 30 minutes prior to MRI scans and radiation visits, may repeat once, just prior to procedures if needed). 30 tablet 0   oxyCODONE-acetaminophen (PERCOCET/ROXICET) 5-325 MG tablet Take 1 tablet by mouth every 8 (eight) hours as needed for severe pain. 60 tablet 0   potassium chloride (KLOR-CON) 10 MEQ tablet Take 10 mEq by mouth daily. (Patient not taking: Reported on 04/21/2022)       rosuvastatin (CRESTOR) 10 MG tablet Take 1 tablet (10 mg total) by mouth daily. 30 tablet 0   vitamin B-12 (CYANOCOBALAMIN) 1000 MCG tablet Take  1 tablet (1,000 mcg total) by mouth daily. 30 tablet 0    No current facility-administered medications for this encounter.      Physical Findings: Vitals were not taken for this visit.  Unsteady gait - patient uses cane Pain Assessment Pain Score: 7  (RT breast & headaches)/10   Lab Findings: Recent Labs       Lab Results  Component Value Date    WBC 7.4 03/27/2022    HGB 9.1 (L) 03/27/2022    HCT 27.7 (L) 03/27/2022    MCV 102.6 (H) 03/27/2022    PLT 294 03/27/2022          Radiographic Findings: MR Brain W Wo Contrast 08/03/2022  Narrative CLINICAL DATA:  Provided history: Metastasis to brain. Brain metastasis, assess treatment response.  EXAM: MRI HEAD WITHOUT AND WITH CONTRAST  TECHNIQUE: Multiplanar, multiecho pulse sequences of the brain and surrounding structures were obtained without and with intravenous contrast.  CONTRAST:  7 mL Vueway intravenous  contrast.  COMPARISON:  Prior brain MRI examinations 04/24/2022 and earlier.  FINDINGS: Brain:  No age advanced or lobar predominant parenchymal atrophy.  Interval evolution of postoperative changes from prior right parietal craniotomy and right parietal lobe metastasis resection. Chronic blood products and peripheral non-masslike enhancement at the resection site, as before. Interval decrease in FLAIR hyperintense signal abnormality within the resection cavity, likely due to resolving postoperative hematoma. T2 FLAIR hyperintense signal abnormality surrounding the resection cavity has increased, but no new mass effect is appreciated, and this is favored to reflect progressive post treatment changes from prior radiation therapy.  2 mm rounded focus of enhancement along the anterolateral left frontal lobe (series 14, image 88). This appears to have been present on prior examinations dating back to 03/09/2022, and is favored to reflect a focus of prominent vascular enhancement.  Moderate multifocal T2 FLAIR hyperintense signal abnormality elsewhere within the cerebral white matter, nonspecific but compatible with chronic small vessel ischemic disease. Redemonstrated chronic lacunar infarct within the left ventral aspect of the pons. Stable mild background pontine chronic small vessel disease.  There is no acute infarct.  No extra-axial fluid collection.  No midline shift.  Vascular: Maintained flow voids within the proximal large arterial vessels.  Skull and upper cervical spine: Prior right parietal craniotomy. No focal suspicious marrow lesion. Incompletely assessed cervical spondylosis.  Sinuses/Orbits: No mass or acute finding within the imaged orbits. Small-volume frothy secretions within the right maxillary sinus. Small mucous retention cyst within the left maxillary sinus. Minimal mucosal thickening within the bilateral ethmoid air cells.  IMPRESSION: Continued  evolution of postoperative changes from prior right parietal lobe metastasis resection. No masslike or nodular enhancement at the resection site to suggest residual/recurrent tumor. T2 FLAIR hyperintense signal abnormality surrounding the resection cavity has progressed, however, no developing mass effect is appreciated and this is favored to reflect progressive post treatment changes from prior radiation therapy.  2 mm rounded focus of enhancement along the anterolateral left frontal lobe. This appears to have been present on prior examinations dating back to 03/09/2022, and is favored to reflect a focus of prominent vascular enhancement. However, continued attention is recommended on follow-up to exclude a metastasis at this site.  Background chronic small vessel ischemic changes within the supratentorial and infratentorial brain, stable.   Electronically Signed By: Kellie Simmering D.O. On: 08/05/2022 17:04    Impression/Plan: 32.         Tabitha Ramirez s/p resection of a solitary brain metastasis secondary to  triple negative breast cancer.  She appears to be recovering well from the effects of her recent postoperative fractionated stereotactic radiotherapy and is currently without complaints from that standpoint.  We reviewed and discussed her MRI results today. MRI showed normal post operative changes from right parietal craniotomy and no evidence of disease progression. Of note, a small 2 mm rounded focus of enhancement along the anterolateral left frontal lobe was seen. This has been visualized on prior imaging dating back to 03/09/2022 and is favored to reflect a focus of prominent vascular enhancement. We will however, continue to watch this site closely.  We will continue with serial MRI brain scans every 3 months going forward to continue to closely monitor for any evidence of disease recurrence or progression.  I will plan to call them following each scan to discuss the results and any  recommendations from the multidisciplinary brain conference but they know that they are welcome to call at anytime in the interim with any questions or concerns.   2.         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 She was treated with neoadjuvant chemotherapy followed by lumpectomy on 09/29/2013 and adjuvant breast radiotherapy 11/12/2013 through 12/24/2013.  Her most recent disease restaging CT C/A/P 03/09/2022 was negative for any obvious active primary or metastatic disease. Patient had a follow up visit with Dr. Lindi Adie today. Patient will receive a CT C/A/P in one year and follow-up after that. We will continue to follow expectantly.    We personally spent 20 minutes in this encounter including chart review, reviewing radiological studies, meeting face-to-face with the patient, entering orders and completing documentation.    Leona Singleton, PA-C    Tyler Pita, MD  [facility] Direct Dial: [phone]  Fax: [phone] [email or web address]  Skype  [email or web address]    Page Me

## 2022-08-07 NOTE — Addendum Note (Signed)
Encounter addended by: Sharol Given, RN on: 08/07/2022 11:29 AM  Actions taken: Charge Capture section accepted

## 2022-08-09 ENCOUNTER — Encounter: Payer: Self-pay | Admitting: Hematology and Oncology

## 2022-08-09 ENCOUNTER — Other Ambulatory Visit: Payer: Self-pay | Admitting: Nurse Practitioner

## 2022-08-09 DIAGNOSIS — G893 Neoplasm related pain (acute) (chronic): Secondary | ICD-10-CM

## 2022-08-09 DIAGNOSIS — C50911 Malignant neoplasm of unspecified site of right female breast: Secondary | ICD-10-CM

## 2022-08-09 DIAGNOSIS — Z515 Encounter for palliative care: Secondary | ICD-10-CM

## 2022-08-09 MED ORDER — OXYCODONE-ACETAMINOPHEN 5-325 MG PO TABS: 1.0000 | ORAL_TABLET | Freq: Three times a day (TID) | ORAL | 0 refills | Status: DC | PRN

## 2022-08-10 ENCOUNTER — Encounter: Payer: Self-pay | Admitting: Occupational Therapy

## 2022-08-10 ENCOUNTER — Encounter: Payer: Self-pay | Admitting: Physical Medicine & Rehabilitation

## 2022-08-11 NOTE — Procedures (Signed)
  Name: MARISAH LAKER  MRN: [id number]  Date: 04/27/2022   DOB: [birth year]  Stereotactic Radiosurgery Operative Note  PRE-OPERATIVE DIAGNOSIS:  Solitary Brain Metastasis  POST-OPERATIVE DIAGNOSIS:  Solitary Brain Metastasis  PROCEDURE:  Stereotactic Radiosurgery  SURGEON:  Elaina Hoops, MD  NARRATIVE: The patient underwent a radiation treatment planning session in the radiation oncology simulation suite under the care of the radiation oncology physician and physicist.  I participated closely in the radiation treatment planning afterwards. The patient underwent planning CT which was fused to 3T high resolution MRI with 1 mm axial slices.  These images were fused on the planning system.  We contoured the gross target volumes and subsequently expanded this to yield the Planning Target Volume. I actively participated in the planning process.  I helped to define and review the target contours and also the contours of the optic pathway, eyes, brainstem and selected nearby organs at risk.  All the dose constraints for critical structures were reviewed and compared to AAPM Task Group 101.  The prescription dose conformity was reviewed.  I approved the plan electronically.    Accordingly, [name] was brought to the TrueBeam stereotactic radiation treatment linac and placed in the custom immobilization mask.  The patient was aligned according to the IR fiducial markers with BrainLab Exactrac, then orthogonal x-rays were used in ExacTrac with the 6DOF robotic table and the shifts were made to align the patient  [name] received stereotactic radiosurgery uneventfully.    The detailed description of the procedure is recorded in the radiation oncology procedure note.  I was present for the duration of the procedure.  DISPOSITION:  Following delivery, the patient was transported to nursing in stable condition and monitored for possible acute effects to be discharged to home in stable condition with  follow-up in one month.  Elaina Hoops, MD 08/11/2022 10:31 AM

## 2022-08-11 NOTE — Addendum Note (Signed)
Encounter addended by: Kary Kos, MD on: 08/11/2022 10:31 AM  Actions taken: Clinical Note Signed

## 2022-08-21 ENCOUNTER — Other Ambulatory Visit: Payer: Self-pay

## 2022-08-21 DIAGNOSIS — C7931 Secondary malignant neoplasm of brain: Secondary | ICD-10-CM

## 2022-08-30 ENCOUNTER — Other Ambulatory Visit: Payer: Self-pay | Admitting: Nurse Practitioner

## 2022-08-30 DIAGNOSIS — C50911 Malignant neoplasm of unspecified site of right female breast: Secondary | ICD-10-CM

## 2022-08-30 DIAGNOSIS — Z515 Encounter for palliative care: Secondary | ICD-10-CM

## 2022-08-30 DIAGNOSIS — G893 Neoplasm related pain (acute) (chronic): Secondary | ICD-10-CM

## 2022-08-30 MED ORDER — OXYCODONE-ACETAMINOPHEN 5-325 MG PO TABS: 1.0000 | ORAL_TABLET | Freq: Three times a day (TID) | ORAL | 0 refills | Status: DC | PRN

## 2022-09-06 ENCOUNTER — Ambulatory Visit (HOSPITAL_COMMUNITY)
Admission: RE | Admit: 2022-09-06 | Discharge: 2022-09-06 | Disposition: A | Discharge status: Home / Self Care | Payer: Medicare PPO | Source: Ambulatory Visit | Attending: Hematology and Oncology | Admitting: Hematology and Oncology

## 2022-09-06 DIAGNOSIS — Z171 Estrogen receptor negative status [ER-]: Secondary | ICD-10-CM | POA: Diagnosis present

## 2022-09-06 DIAGNOSIS — C50111 Malignant neoplasm of central portion of right female breast: Secondary | ICD-10-CM | POA: Insufficient documentation

## 2022-09-06 MED ORDER — SODIUM CHLORIDE (PF) 0.9 % IJ SOLN
INTRAMUSCULAR | Status: AC
  Filled 2022-09-06: qty 50

## 2022-09-06 MED ORDER — IOHEXOL 300 MG/ML  SOLN
100.0000 mL | Freq: Once | INTRAMUSCULAR | Status: AC | PRN
  Administered 2022-09-06: 100 mL via INTRAVENOUS

## 2022-09-08 NOTE — Progress Notes (Signed)
HEMATOLOGY-ONCOLOGY TELEPHONE VISIT PROGRESS NOTE  I connected with our patient on 09/11/22 at 10:30 AM EST by telephone and verified that I am speaking with the correct person using two identifiers.  I discussed the limitations, risks, security and privacy concerns of performing an evaluation and management service by telephone and the availability of in person appointments.  I also discussed with the patient that there may be a patient responsible charge related to this service. The patient expressed understanding and agreed to proceed.   History of Present Illness: Tabitha Ramirez is a 76 y.o. with above-mentioned history of right breast cancer. currently on surveillance. She presents to the clinic today for a telephone follow-up  Neuropathy is quite bad especially in winter. Cold and cough for about 3 days.  Oncology History  Cancer of central portion of right female breast (HCC)  01/31/2013 Mammogram   Area of distortion upper-outer quadrant right breast diffuse calcifications in both breasts 2.5 cm with prominent right axillary lymph node 2.5 cm   01/31/2013 Initial Diagnosis   Cancer of central portion of female breast: 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 Ki-67 84%; sentinel lymph node sampling 03/10/2013 showed1/2 SLN pos PR 3% PR 6% Ki-67 15% HER-2 negative   02/10/2013 Breast MRI   Right breast: Rim enhancing necrotic mass 3.4 cm with a 2 cm level I axillary lymph node   03/17/2013 - 08/25/2013 Neo-Adjuvant Chemotherapy   Dose dense Adriamycin and Cytoxan Followed by weekly Taxol and carboplatin x12; chemotherapy-induced anemia and peripheral neuropathy were the complications   09/29/2013 Surgery   Right breast lumpectomy scatter residual invasive mass 1.1 cm with [facility] 0/11 lymph nodes no cancer seen: ER 0% PR 0% insufficient to mark it on HER-2 testing   11/12/2013 - 12/24/2013 Radiation Therapy   Radiation therapy to the breast and axilla     REVIEW OF SYSTEMS:    Constitutional: Denies fevers, chills or abnormal weight loss All other systems were reviewed with the patient and are negative. Observations/Objective:     Assessment Plan:  Cancer of central portion of right female breast (HCC) 1. 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 N0 M0 ER PR 0% HER-2 negative status post radiation therapy   2. brain metastases: 03/09/2022: 3 x 2.3 x 1.8 cm irregular mass in the right parietal lobe with localized vasogenic edema status postcraniotomy 03/11/2022 and resection: Positive for known breast cancer (positive for CK7 and GATA 3 and negative for CK20) ER/PR negative, HER2 negative (score 0)   Breast Cancer Surveillance: 1. CT CAP 09/07/2022: No evidence of metastatic disease 2.  MRI brain 03/12/2022: Postoperative changes from right parietal craniotomy, gross total resection of the tumor no residual tumor, small acute ischemic infarct deep in the resection cavity.   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   April 2019 at [facility]: Hospitalization for pneumonia and lower extremity paralysis    Recommendation: 1.  Radiation therapy: SRS 05/03/2022 2. followed by surveillance.  Since the CT chest abdomen pelvis does not show any evidence of metastatic disease, I do not recommend systemic chemotherapy at this time.   Bronchitis: With upper respiratory infection: I sent a prescription for azithromycin. Return to clinic every 6 months with scans and follow-up   I discussed the assessment and treatment plan with the patient. The  patient was provided an opportunity to ask questions and all were answered. The patient agreed with the plan and demonstrated an understanding of the instructions. The patient was advised to call back or seek an in-person  evaluation if the symptoms worsen or if the condition fails to improve as anticipated.   I provided 12 minutes of non-face-to-face time during this encounter.  This includes time for charting and coordination of care   Tamsen Meek, MD  I Janan Ridge am acting as a scribe for Dr.Salimata Christenson  I have reviewed the above documentation for accuracy and completeness, and I agree with the above.

## 2022-09-11 ENCOUNTER — Encounter: Payer: Self-pay | Admitting: Hematology and Oncology

## 2022-09-11 ENCOUNTER — Inpatient Hospital Stay: Payer: Medicare PPO | Admitting: Nurse Practitioner

## 2022-09-11 ENCOUNTER — Inpatient Hospital Stay: Payer: Medicare PPO | Attending: Hematology and Oncology | Admitting: Hematology and Oncology

## 2022-09-11 DIAGNOSIS — C50911 Malignant neoplasm of unspecified site of right female breast: Secondary | ICD-10-CM | POA: Diagnosis not present

## 2022-09-11 DIAGNOSIS — Z171 Estrogen receptor negative status [ER-]: Secondary | ICD-10-CM | POA: Diagnosis not present

## 2022-09-11 DIAGNOSIS — C7931 Secondary malignant neoplasm of brain: Secondary | ICD-10-CM | POA: Diagnosis not present

## 2022-09-11 DIAGNOSIS — C50111 Malignant neoplasm of central portion of right female breast: Secondary | ICD-10-CM

## 2022-09-11 MED ORDER — AZITHROMYCIN 250 MG PO TABS: ORAL_TABLET | ORAL | 0 refills | Status: DC

## 2022-09-11 NOTE — Assessment & Plan Note (Signed)
1. 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 N0 M0 ER PR 0% HER-2 negative status post radiation therapy   2. brain metastases: 03/09/2022: 3 x 2.3 x 1.8 cm irregular mass in the right parietal lobe with localized vasogenic edema status postcraniotomy 03/11/2022 and resection: Positive for known breast cancer (positive for CK7 and GATA 3 and negative for CK20) ER/PR negative, HER2 negative (score 0)   Breast Cancer Surveillance: 1. CT CAP 09/07/2022: No evidence of metastatic disease 2.  MRI brain 03/12/2022: Postoperative changes from right parietal craniotomy, gross total resection of the tumor no residual tumor, small acute ischemic infarct deep in the resection cavity.   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   April 2019 at [facility]: Hospitalization for pneumonia and lower extremity paralysis    Recommendation: 1.  Radiation therapy: SRS 05/03/2022 2. followed by surveillance.  Since the CT chest abdomen pelvis does not show any evidence of metastatic disease, I do not recommend systemic chemotherapy at this time.   Return to clinic every 6 months with scans and follow-up

## 2022-09-15 ENCOUNTER — Other Ambulatory Visit: Payer: Self-pay | Admitting: Nurse Practitioner

## 2022-09-15 DIAGNOSIS — Z515 Encounter for palliative care: Secondary | ICD-10-CM

## 2022-09-15 DIAGNOSIS — G893 Neoplasm related pain (acute) (chronic): Secondary | ICD-10-CM

## 2022-09-15 DIAGNOSIS — C50911 Malignant neoplasm of unspecified site of right female breast: Secondary | ICD-10-CM

## 2022-09-15 MED ORDER — OXYCODONE-ACETAMINOPHEN 5-325 MG PO TABS: 1.0000 | ORAL_TABLET | Freq: Three times a day (TID) | ORAL | 0 refills | Status: DC | PRN

## 2022-09-29 ENCOUNTER — Encounter: Payer: Self-pay | Admitting: Nurse Practitioner

## 2022-09-29 ENCOUNTER — Inpatient Hospital Stay: Payer: Medicare PPO | Attending: Hematology and Oncology | Admitting: Nurse Practitioner

## 2022-09-29 DIAGNOSIS — R530 Neoplastic (malignant) related fatigue: Secondary | ICD-10-CM

## 2022-09-29 DIAGNOSIS — Z515 Encounter for palliative care: Secondary | ICD-10-CM | POA: Diagnosis not present

## 2022-09-29 DIAGNOSIS — M792 Neuralgia and neuritis, unspecified: Secondary | ICD-10-CM | POA: Diagnosis not present

## 2022-09-29 DIAGNOSIS — G893 Neoplasm related pain (acute) (chronic): Secondary | ICD-10-CM

## 2022-09-29 NOTE — Progress Notes (Signed)
[place]  [phone] [phone]   Name: Tabitha Ramirez Date: 09/29/2022 MRN: [id number]  DOB: [birth year]  Patient Care Team: [place] [place] [facility] as PCP - General (Family Medicine) Magrinat, Virgie Dad, MD (Inactive) as Consulting Physician (Oncology) Pickenpack-Cousar, Carlena Sax, NP as Nurse Practitioner (Nurse Practitioner)   I connected with Tabitha Ramirez on 09/29/22 at 11:30 AM EST by phone and verified that I am speaking with the correct person using two identifiers.   I discussed the limitations, risks, security and privacy concerns of performing an evaluation and management service by telemedicine and the availability of in-person appointments. I also discussed with the patient that there may be a patient responsible charge related to this service. The patient expressed understanding and agreed to proceed.   Other persons participating in the visit and their role in the encounter: [name], daughter and Maygan, RN    Patient's location: Home   Provider's location: [place]    Chief Complaint: Symptom Management follow-up   INTERVAL HISTORY: Tabitha Ramirez is a 77 y.o. 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  Palliative ask to see for symptom management and goals of care.   SOCIAL HISTORY:     reports that she has quit smoking. Her smoking use included cigarettes. She has never used smokeless tobacco. She reports that she does not drink alcohol and does not use drugs.  ADVANCE DIRECTIVES:    CODE STATUS:   PAST MEDICAL HISTORY: Past Medical History:  Diagnosis Date   Allergy    Anemia    Arthritis    "joints" (09/29/2013)   Breast cancer ([place])    GERD (gastroesophageal reflux disease)    Heart murmur    History of blood transfusion    "related to chemo/breast cancer" (09/29/2013)   Hypertension    Migraines    Personal  history of chemotherapy    Personal history of radiation therapy    Radiation 11/05/13-12/24/13   Right breast    TMJ (temporomandibular joint syndrome)    "left; just dx'd" (09/29/2013    ALLERGIES:  is allergic to nsaids and penicillins.  MEDICATIONS:  Current Outpatient Medications  Medication Sig Dispense Refill   acetaminophen (TYLENOL) 325 MG tablet Take 1-2 tablets (325-650 mg total) by mouth every 4 (four) hours as needed for mild pain.     anastrozole (ARIMIDEX) 1 MG tablet Take 1 tablet (1 mg total) by mouth daily. 90 tablet 3   azithromycin (ZITHROMAX Z-PAK) 250 MG tablet Use as directed 6 each 0   Cholecalciferol (D3-1000) 25 MCG (1000 UT) capsule Take 1 capsule (1,000 Units total) by mouth daily. 30 capsule 0   ferrous sulfate 325 (65 FE) MG tablet Take 1 tablet (325 mg total) by mouth daily with breakfast. 30 tablet 3   levETIRAcetam (KEPPRA) 500 MG tablet Take 1 tablet (500 mg total) by mouth 2 (two) times daily. 180 tablet 0   levothyroxine (SYNTHROID) 50 MCG tablet Take 1 tablet (50 mcg total) by mouth daily. 30 tablet 0   LORazepam (ATIVAN) 0.5 MG tablet Take 1 tablet (0.5 mg total) by mouth every 8 (eight) hours as needed for anxiety (take 1 tablet as needed for anxiety every 8 hours). 30 tablet 0   oxyCODONE-acetaminophen (PERCOCET/ROXICET) 5-325 MG tablet Take 1 tablet by mouth every 8 (eight) hours as needed for severe pain. 90 tablet 0   potassium chloride (KLOR-CON) 10 MEQ  tablet Take 10 mEq by mouth daily. (Patient not taking: Reported on 04/21/2022)     rosuvastatin (CRESTOR) 10 MG tablet Take 1 tablet (10 mg total) by mouth daily. 30 tablet 0   vitamin B-12 (CYANOCOBALAMIN) 1000 MCG tablet Take 1 tablet (1,000 mcg total) by mouth daily. 30 tablet 0   No current facility-administered medications for this visit.    VITAL SIGNS: There were no vitals taken for this visit. There were no vitals filed for this visit.   Estimated body mass index is 23.34 kg/m as  calculated from the following:   Height as of 08/07/22: '5\' 4"'$  (1.626 m).   Weight as of 08/07/22: 136 lb (61.7 kg).   PERFORMANCE STATUS (ECOG) : 1 - Symptomatic but completely ambulatory    IMPRESSION: I connected by phone with Tabitha Ramirez and her daughter. No acute distress noted. Tabitha Ramirez shares she has been doing well overall. Is trying to remain as active as possible. Sleeping well at night. Appetite continues to be good. Denies any concerns with nausea, vomiting, constipation, or diarrhea. Anxiety is controlled.    1.  Neuropathic pain  Patient reports her pain is well controlled on current regimen. Fluctuates from day to day depending on level of activity.   We discussed her current regimen of oxycodone 5/325. She is tolerating well. Does not have to take around the clock. Some days are better than others. No changes needed at this time.    She understands we will make no changes and continue to closely monitor.   2. Anxiety    PLAN: Oxycodone/APAP 5/325 mg every 8 hours as needed for pain Lorazepam 0.5 mg as needed for anxiety.  We will continue to closely monitor and adjust medications for symptom management. Ongoing goals of care discussions and support as needed I will plan to see patient back in 4-6 weeks in collaboration with her oncology appointments.  Daughter knows to contact our office sooner if needed.   Patient expressed understanding and was in agreement with this plan. She also understands that She can call the clinic at any time with any questions, concerns, or complaints.    Any controlled substances utilized were prescribed in the context of palliative care. PDMP has been reviewed.    Time Total: 20 min   Visit consisted of counseling and education dealing with the complex and emotionally intense issues of symptom management and palliative care in the setting of serious and potentially life-threatening illness.Greater than 50%  of this time was spent  counseling and coordinating care related to the above assessment and plan.  Alda Lea, AGPCNP-BC  Palliative Medicine Team/[facility]

## 2022-10-04 ENCOUNTER — Encounter: Payer: Medicare PPO | Admitting: Physical Medicine & Rehabilitation

## 2022-10-11 ENCOUNTER — Encounter: Payer: Self-pay | Admitting: Physical Medicine & Rehabilitation

## 2022-10-16 ENCOUNTER — Other Ambulatory Visit: Payer: Self-pay | Admitting: Nurse Practitioner

## 2022-10-16 DIAGNOSIS — C50911 Malignant neoplasm of unspecified site of right female breast: Secondary | ICD-10-CM

## 2022-10-16 DIAGNOSIS — Z515 Encounter for palliative care: Secondary | ICD-10-CM

## 2022-10-16 DIAGNOSIS — G893 Neoplasm related pain (acute) (chronic): Secondary | ICD-10-CM

## 2022-10-16 MED ORDER — OXYCODONE-ACETAMINOPHEN 5-325 MG PO TABS: 1.0000 | ORAL_TABLET | Freq: Three times a day (TID) | ORAL | 0 refills | Status: DC | PRN

## 2022-10-17 ENCOUNTER — Telehealth: Payer: Self-pay

## 2022-10-17 ENCOUNTER — Other Ambulatory Visit: Payer: Self-pay | Admitting: Neurosurgery

## 2022-10-17 DIAGNOSIS — R569 Unspecified convulsions: Secondary | ICD-10-CM

## 2022-10-17 NOTE — Telephone Encounter (Signed)
Received electronic refill request for levetiracetam. Per discussion with Dr Izora Ribas, ok to refill levetiracetam one more time since she doesn't have anyone to manage refills. We will also place referral to neurology for seizure management. Notified pt via mychart.

## 2022-10-31 ENCOUNTER — Encounter: Payer: Self-pay | Admitting: Neurology

## 2022-11-02 ENCOUNTER — Ambulatory Visit
Admission: RE | Admit: 2022-11-02 | Discharge: 2022-11-02 | Disposition: A | Discharge status: Home / Self Care | Payer: Medicare PPO | Source: Ambulatory Visit | Attending: Radiation Oncology | Admitting: Radiation Oncology

## 2022-11-02 DIAGNOSIS — C7931 Secondary malignant neoplasm of brain: Secondary | ICD-10-CM

## 2022-11-02 MED ORDER — GADOPICLENOL 0.5 MMOL/ML IV SOLN
6.5000 mL | Freq: Once | INTRAVENOUS | Status: AC | PRN
  Administered 2022-11-02: 6.5 mL via INTRAVENOUS

## 2022-11-08 ENCOUNTER — Encounter: Payer: Self-pay | Admitting: Urology

## 2022-11-08 ENCOUNTER — Ambulatory Visit
Admission: RE | Admit: 2022-11-08 | Discharge: 2022-11-08 | Disposition: A | Discharge status: Home / Self Care | Payer: Medicare PPO | Source: Ambulatory Visit | Attending: Urology | Admitting: Urology

## 2022-11-08 ENCOUNTER — Inpatient Hospital Stay: Payer: Medicare PPO | Attending: Hematology and Oncology | Admitting: Nurse Practitioner

## 2022-11-08 ENCOUNTER — Encounter: Payer: Self-pay | Admitting: Nurse Practitioner

## 2022-11-08 VITALS — BP 163/87 | HR 84 | Temp 98.5°F | Resp 16 | Wt 140.7 lb

## 2022-11-08 DIAGNOSIS — Z515 Encounter for palliative care: Secondary | ICD-10-CM

## 2022-11-08 DIAGNOSIS — R630 Anorexia: Secondary | ICD-10-CM

## 2022-11-08 DIAGNOSIS — C50911 Malignant neoplasm of unspecified site of right female breast: Secondary | ICD-10-CM

## 2022-11-08 DIAGNOSIS — G893 Neoplasm related pain (acute) (chronic): Secondary | ICD-10-CM | POA: Diagnosis not present

## 2022-11-08 DIAGNOSIS — F419 Anxiety disorder, unspecified: Secondary | ICD-10-CM | POA: Diagnosis not present

## 2022-11-08 NOTE — Progress Notes (Signed)
Telephone nursing appointment to review most recent MRI results from 11/04/2022 w/ Ashlyn Bruning PA-C.   Patient identity verified. Patient reports general arthritis pain due to cold weather. No other issues reported at this time. Meaningful use complete.   Patient aware of their 8:30am-11/08/22 telephone appointment w/ Ashlyn Bruning PA-C.  Patient verbalized understanding. This concludes the nursing interview.   Patient contact [phone]     Leandra Kern, LPN

## 2022-11-08 NOTE — Progress Notes (Signed)
Radiation Oncology         [phone] ________________________________   Name: Tabitha Ramirez MRN: [id number]         Date: 05/31/2022                        DOB: 1946/02/18   Post Treatment Note   CC: [place], [facility], [place] [facility]   Diagnosis:   77 yo woman s/p resection and post-operative fractionated SRS of a solitary brain metastasis secondary to triple negative breast cancer.     Interval Since Last Radiation: 6 months 04/27/22 - 05/03/22:  Post-op fractionated SRS to the Right parietal resection cavity to 27 Gy in 3 fractions of 9 Gy each.    11/05/13 - 12/12/13:   Right breast / 45 Gray @ 1.8 [place] Cables per fraction x 25 fractions Pablo Ledger) Right supraclavicular fossa / 45 Gy @1$ .8 Gy per fraction x 25 fractions Pablo Ledger)   12/15/13 - 12/24/13: Right breast boost / 16 Gray at 2 [place] Cables per fraction x 8 fractions Pablo Ledger)   Narrative:   She tolerated radiation treatment relatively well without any ill side effects.                               On review of systems, obtained through her daughter, [name], the patient has continued doing well in general and remains without complaints. She specifically denies persistent or frequent headaches, nausea, vomiting, changes in visual or auditory acuity, new focal weakness, imbalance/dizziness, tremor or seizure activity. 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 She continues in routine follow up with Dr. Lindi Adie for continued surveillance of her systemic disease- currently on observation only with restaging CT C/A/P from 09/06/22 showing no evidence of active disease.  She is off all steroids since completing the stereotactic radiation and has not had any issues or complaints. Her daughter is on the phone with her for our visit today and overall, they  are pleased with her progress to date. She had a recent follow up MRI brain scan on 11/02/22 and we reviewed these results by telephone today.   ALLERGIES:  is allergic to nsaids and penicillins.   Meds:       Current Outpatient Medications  Medication Sig Dispense Refill   acetaminophen (TYLENOL) 325 MG tablet Take 1-2 tablets (325-650 mg total) by mouth every 4 (four) hours as needed for mild pain.       anastrozole (ARIMIDEX) 1 MG tablet Take 1 tablet (1 mg total) by mouth daily. 90 tablet 3   Cholecalciferol (D3-1000) 25 MCG (1000 UT) capsule Take 1 capsule (1,000 Units total) by mouth daily. 30 capsule 0   DULoxetine (CYMBALTA) 30 MG capsule Take 1 capsule (30 mg total) by mouth daily. 30 capsule 0   ferrous sulfate 325 (65 FE) MG tablet Take 1 tablet (325 mg total) by mouth daily with breakfast. 30 tablet 3   levETIRAcetam (KEPPRA) 500 MG tablet Take 1 tablet (500 mg total) by mouth 2 (two) times daily. 120 tablet 0   levothyroxine (SYNTHROID) 50 MCG tablet Take 1 tablet (50 mcg total) by mouth daily. 30 tablet 0   LORazepam (ATIVAN) 0.5 MG tablet Take 1 tablet (0.5 mg total) by mouth as needed for anxiety (take  1 tablet 30 minutes prior to MRI scans and radiation visits, may repeat once, just prior to procedures if needed). 30 tablet 0   oxyCODONE-acetaminophen (PERCOCET/ROXICET) 5-325 MG tablet Take 1 tablet by mouth every 8 (eight) hours as needed for severe pain. 60 tablet 0   potassium chloride (KLOR-CON) 10 MEQ tablet Take 10 mEq by mouth daily. (Patient not taking: Reported on 04/21/2022)       rosuvastatin (CRESTOR) 10 MG tablet Take 1 tablet (10 mg total) by mouth daily. 30 tablet 0   vitamin B-12 (CYANOCOBALAMIN) 1000 MCG tablet Take 1 tablet (1,000 mcg total) by mouth daily. 30 tablet 0    No current facility-administered medications for this encounter.      Physical Findings: Vitals were not taken for this visit.  Unsteady gait - patient uses cane Pain Assessment Pain  Score: 7  (RT breast & headaches)/10   Lab Findings: Recent Labs       Lab Results  Component Value Date    WBC 7.4 03/27/2022    HGB 9.1 (L) 03/27/2022    HCT 27.7 (L) 03/27/2022    MCV 102.6 (H) 03/27/2022    PLT 294 03/27/2022          Radiographic Findings: MR Brain W Wo Contrast 08/03/2022  Narrative CLINICAL DATA:  Provided history: Metastasis to brain. Brain metastasis, assess treatment response.  EXAM: MRI HEAD WITHOUT AND WITH CONTRAST  TECHNIQUE: Multiplanar, multiecho pulse sequences of the brain and surrounding structures were obtained without and with intravenous contrast.  CONTRAST:  6.5 mL Vueway intravenous contrast.  COMPARISON:  Prior brain MRI examinations 08/03/2022, 04/24/2022  and earlier.  FINDINGS: Brain: Brainstem is unchanged with mild chronic small-vessel ischemic change. No focal cerebellar finding.   Previous tumor resection in right parietal lobe. Post resection space is 2 mm smaller. Thin enhancement along the margin of the resection, particularly inferiorly, shows slight regression. Surrounding T2 and FLAIR signal is not increased. No sign of mass effect.   Elsewhere, chronic small-vessel ischemic changes affecting the hemispheric white matter are unchanged. Punctate focus of surface enhancement of the left frontal lobe axial image 105 is stable since the study of 03/09/2022 and therefore unlikely to represent a viable metastatic lesion. Recommend continued observation on subsequent scans.   No hydrocephalus or extra-axial collection. No sign of interval new lesion.   Vascular: Major vessels at the base of the brain show flow.   Skull and upper cervical spine: Otherwise negative   Sinuses/Orbits: Clear/normal   Other: None   IMPRESSION: 1. Previous tumor resection in the right parietal lobe. Slight reduction in size of the post resection space. Thin enhancement along the margin of the resection, particularly inferiorly,  shows slight regression. Surrounding T2 and FLAIR signal is not increased. No progressive or worrisome finding in this region presently. 2. Punctate focus of surface enhancement of the left frontal lobe axial image 105 is stable since the study of 03/09/2022 and therefore unlikely to represent a viable metastatic lesion. Recommend continued observation on subsequent scans. 3. Chronic small-vessel ischemic changes elsewhere throughout the brain as seen previously.     Electronically Signed   By: Nelson Chimes M.D.   On: 11/04/2022 16:41   Impression/Plan: 63.         77 yo woman s/p resection and post-operative fractionated SRS of a solitary brain metastasis secondary to triple negative breast cancer.  She has recovered well from the effects of her recent postoperative fractionated stereotactic radiotherapy and is currently  without complaints from that standpoint.  We reviewed and discussed her MRI brain results today by telephone. This recent brain MRI from 11/02/22 shows normal post operative changes with a slight reduction in the size of the space, s/p right parietal craniotomy and no evidence of disease progression. The small, 2 mm rounded focus of enhancement along the anterolateral left frontal lobe remains stable. This has been visualized on prior imaging dating back to 03/09/2022 and is favored to reflect a focus of prominent vascular enhancement as opposed to any viable metastasis but we will continue to monitor this closely on future scans.  We will continue with serial MRI brain scans every 3 months going forward to continue to closely monitor for any evidence of disease recurrence or progression.  I will plan to call them following each scan to discuss the results and any recommendations from the multidisciplinary brain conference but they know that they are welcome to call at anytime in the interim with any questions or concerns.   2.         History of triple negative invasive ductal  carcinoma of the right breast. She was treated with neoadjuvant chemotherapy followed by lumpectomy on 09/29/2013 and adjuvant breast radiotherapy 11/12/2013 through 12/24/2013.  Her most recent disease restaging CT C/A/P 09/06/2022 was negative for any obvious active primary or metastatic disease. Patient had a follow up visit with Dr. Lindi Adie 09/11/22 and the plan is to continue in observation only, with a CT C/A/P in one year and follow-up visit with him thereafter. She will also continue in routine follow up with Lexine Baton, NP in palliative care for symptom/neuropathic pain management. We will continue to follow expectantly.  I personally spent 20 minutes in this encounter including chart review, reviewing radiological studies, telephone discussion with the patient and her daughter, [name], entering orders and completing documentation.    Nicholos Johns, MMS, PA-C [place] [facility] [place]: [phone]  Fax: [phone]

## 2022-11-08 NOTE — Progress Notes (Signed)
[place]  [phone] [phone]   Name: Tabitha Ramirez Date: 11/08/2022 MRN: [id number]  DOB: [birth year]  Patient Care Team: [place] [place] [facility] as PCP - General (Family Medicine) Magrinat, Virgie Dad, MD (Inactive) as Consulting Physician (Oncology) Pickenpack-Cousar, Carlena Sax, NP as Nurse Practitioner (Nurse Practitioner)    INTERVAL HISTORY: Tabitha Ramirez is a 77 y.o. 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  Palliative ask to see for symptom management and goals of care.   SOCIAL HISTORY:     reports that she has quit smoking. Her smoking use included cigarettes. She has never used smokeless tobacco. She reports that she does not drink alcohol and does not use drugs.  ADVANCE DIRECTIVES: none in place   CODE STATUS: Full Code  PAST MEDICAL HISTORY: Past Medical History:  Diagnosis Date   Allergy    Anemia    Arthritis    "joints" (09/29/2013)   Breast cancer ([place])    GERD (gastroesophageal reflux disease)    Heart murmur    History of blood transfusion    "related to chemo/breast cancer" (09/29/2013)   Hypertension    Migraines    Personal history of chemotherapy    Personal history of radiation therapy    Radiation 11/05/13-12/24/13   Right breast    TMJ (temporomandibular joint syndrome)    "left; just dx'd" (09/29/2013    ALLERGIES:  is allergic to nsaids and penicillins.  MEDICATIONS:  Current Outpatient Medications  Medication Sig Dispense Refill   acetaminophen (TYLENOL) 325 MG tablet Take 1-2 tablets (325-650 mg total) by mouth every 4 (four) hours as needed for mild pain.     anastrozole (ARIMIDEX) 1 MG tablet Take 1 tablet (1 mg total) by mouth daily. 90 tablet 3   azithromycin (ZITHROMAX Z-PAK) 250 MG tablet Use as directed 6 each 0   Cholecalciferol (D3-1000) 25 MCG (1000 UT) capsule Take 1 capsule (1,000 Units  total) by mouth daily. 30 capsule 0   ferrous sulfate 325 (65 FE) MG tablet Take 1 tablet (325 mg total) by mouth daily with breakfast. 30 tablet 3   levETIRAcetam (KEPPRA) 500 MG tablet TAKE 1 TABLET(500 MG) BY MOUTH TWICE DAILY 180 tablet 0   levothyroxine (SYNTHROID) 50 MCG tablet Take 1 tablet (50 mcg total) by mouth daily. 30 tablet 0   LORazepam (ATIVAN) 0.5 MG tablet Take 1 tablet (0.5 mg total) by mouth every 8 (eight) hours as needed for anxiety (take 1 tablet as needed for anxiety every 8 hours). 30 tablet 0   oxyCODONE-acetaminophen (PERCOCET/ROXICET) 5-325 MG tablet Take 1 tablet by mouth every 8 (eight) hours as needed for severe pain. 90 tablet 0   potassium chloride (KLOR-CON) 10 MEQ tablet Take 10 mEq by mouth daily. (Patient not taking: Reported on 04/21/2022)     rosuvastatin (CRESTOR) 10 MG tablet Take 1 tablet (10 mg total) by mouth daily. 30 tablet 0   vitamin B-12 (CYANOCOBALAMIN) 1000 MCG tablet Take 1 tablet (1,000 mcg total) by mouth daily. 30 tablet 0   No current facility-administered medications for this visit.    VITAL SIGNS: T 98.5, HR 84, R 16, BP 163/87   Estimated body mass index is 23.34 kg/m as calculated from the following:   Height as of 08/07/22: 5' 4"$  (1.626 m).   Weight as of 08/07/22: 136 lb (61.7 kg).   PERFORMANCE STATUS (ECOG) : 1 - Symptomatic but completely  ambulatory    IMPRESSION: Patient presented for visit with daughter Tabitha Ramirez and son-in-law. She is observed alert and oriented with some forgetfulness. Anxiety noted with having two appointments scheduled this morning. Some of this anxiety is alleviated with staff/family support and receiving MRI results.   1.  Neuropathic and arthritic pain  Patient reports her pain is well controlled on current regimen. Fluctuates from day to day depending on level of activity.   We discussed her current regimen of oxycodone 5/325. She is tolerating well. Does not have to take around the clock. Some  days are better than others. No changes needed at this time.    She understands we will make no changes and continue to closely monitor.   2. Anxiety  Patient reports her anxiety is well controlled on current regiment. Reports taking Lorazepam a few times a week, mostly at bedtime. Resting well most nights. No changes needed at this time.  3. Nausea Patient reports having nausea "only when coming in contact with unusual smells." Reports a good appetite and drinking two Ensure supplement drinks per day. Denies vomiting, diarrhea, and constipation.  Encouraged avoidance of strong/unpleasant odors. Encouraged continued intake of Ensure supplemental drinks.  PLAN: Continue Oxycodone/APAP 5/325 mg every 8 hours as needed for pain Continue Lorazepam 0.5 mg as needed for anxiety.  Continue Ensure supplements and avoid foods with strong/unpleasant odors for patient. We will continue to closely monitor and adjust medications for symptom management. Ongoing goals of care discussions and support as needed I will plan to see patient back in 4-6 weeks in collaboration with her oncology appointments.  Daughter knows to contact our office sooner if needed.   Patient expressed understanding and was in agreement with this plan. She also understands that She can call the clinic at any time with any questions, concerns, or complaints.   Signed by: Moss Mc, RN, MSN, [facility] / NP Student   Any controlled substances utilized were prescribed in the context of palliative care. PDMP has been reviewed.   Time Total: 30 min   I assessed Ms. Cottam in joint visit today. Agree with above documentation.  Visit consisted of counseling and education dealing with the complex and emotionally intense issues of symptom management and palliative care in the setting of serious and potentially life-threatening illness.Greater than 50%  of this time was spent counseling and coordinating care related to the above  assessment and plan.  Alda Lea, AGPCNP-BC  Palliative Medicine Team/[place]

## 2022-11-13 ENCOUNTER — Other Ambulatory Visit: Payer: Self-pay | Admitting: Nurse Practitioner

## 2022-11-13 DIAGNOSIS — C50911 Malignant neoplasm of unspecified site of right female breast: Secondary | ICD-10-CM

## 2022-11-13 DIAGNOSIS — Z515 Encounter for palliative care: Secondary | ICD-10-CM

## 2022-11-13 DIAGNOSIS — G893 Neoplasm related pain (acute) (chronic): Secondary | ICD-10-CM

## 2022-11-13 MED ORDER — OXYCODONE-ACETAMINOPHEN 5-325 MG PO TABS: 1.0000 | ORAL_TABLET | Freq: Three times a day (TID) | ORAL | 0 refills | Status: DC | PRN

## 2022-11-22 ENCOUNTER — Ambulatory Visit: Payer: Medicare PPO | Admitting: Neurology

## 2022-12-11 ENCOUNTER — Other Ambulatory Visit: Payer: Self-pay | Admitting: Radiation Therapy

## 2022-12-11 DIAGNOSIS — C7931 Secondary malignant neoplasm of brain: Secondary | ICD-10-CM

## 2022-12-12 ENCOUNTER — Other Ambulatory Visit: Payer: Self-pay | Admitting: Nurse Practitioner

## 2022-12-12 ENCOUNTER — Encounter: Payer: Self-pay | Admitting: Neurology

## 2022-12-12 ENCOUNTER — Ambulatory Visit: Payer: Medicare PPO | Admitting: Neurology

## 2022-12-12 VITALS — BP 125/71 | HR 76 | Ht 63.0 in | Wt 136.0 lb

## 2022-12-12 DIAGNOSIS — C50911 Malignant neoplasm of unspecified site of right female breast: Secondary | ICD-10-CM

## 2022-12-12 DIAGNOSIS — G893 Neoplasm related pain (acute) (chronic): Secondary | ICD-10-CM

## 2022-12-12 DIAGNOSIS — Z515 Encounter for palliative care: Secondary | ICD-10-CM

## 2022-12-12 DIAGNOSIS — G40209 Localization-related (focal) (partial) symptomatic epilepsy and epileptic syndromes with complex partial seizures, not intractable, without status epilepticus: Secondary | ICD-10-CM | POA: Diagnosis not present

## 2022-12-12 DIAGNOSIS — C7931 Secondary malignant neoplasm of brain: Secondary | ICD-10-CM | POA: Diagnosis not present

## 2022-12-12 MED ORDER — OXYCODONE-ACETAMINOPHEN 5-325 MG PO TABS: 1.0000 | ORAL_TABLET | Freq: Three times a day (TID) | ORAL | 0 refills | Status: DC | PRN

## 2022-12-12 MED ORDER — LEVETIRACETAM 500 MG PO TABS: ORAL_TABLET | ORAL | 3 refills | Status: DC

## 2022-12-12 NOTE — Progress Notes (Signed)
NEUROLOGY CONSULTATION NOTE  Tabitha Ramirez MRN: [id number] DOB: March 26, 1946  Referring provider: Dr. Meade Maw Primary care provider: [facility]  Reason for consult:  establish care for seizures  Dear Dr Izora Ribas:  Thank you for your kind referral of Tabitha Ramirez for consultation of the above symptoms. Although her history is well known to you, please allow me to reiterate it for the purpose of our medical record. The patient was accompanied to the clinic by her daughter [name] who also provides collateral information. Records and images were personally reviewed where available.   HISTORY OF PRESENT ILLNESS: This is a pleasant 77 year old right-handed woman with a history of hyperlipidemia, metastatic breast cancer with brain metastasis, presenting to establish care. Records were reviewed and will be summarized as follows. She was in her usual state of health until 03/09/22 when she noticed that she could see something in front of her but could not grab it, she was trying to put her top on but could not figure out how to put her clothes on. She called her daughter and told her about her symptoms, her son-in-law came in and noticed the confusion. Initially there was no focal weakness, but symptoms progressed as she arrived at the ER where she had a left facial droop, left arm/leg drift, and decreased sensation and ataxia. No jerking or convulsive activity. EEG showed diffuse slowing in addition to lateralized right hemisphere slowing. I personally reviewed initial brain MRI with and without contrast showing a 3 x 2.3 x 1.8 cm irregular enhancing mass in the right parietal lobe with localized vasogenic edema. She underwent craniotomy with tumor resection on 03/11/22 with pathology showing metastatic carcinoma compatible with known mammary primary. She has been seeing Oncology and underwent radiation and chemotherapy, her last brain MRI with and without contrast in  10/2022 showed prior surgical changes in the right parietal lobe, thin enhancement along the margin of resection, particularly inferior shows slight regression, punctate focus of surface enhancement of left frontal lobe stable from 02/2022 and unlikely to represent a viable metastatic lesion, continue to monitor.   She denies any further left-sided symptoms or apraxia since 02/2022. [name] denies any staring/unresponsive episodes. No gaps in time, olfactory/gustatory hallucinations, deja vu, rising epigastric sensation, focal numbness/tingling/weakness, myoclonic jerks. She has been on Levetiracetam 500mg  BID without side effects. She had been doing well with no headaches until last week when she started telling [name] about a sharp pain in the surgical site on the right, as well as behind her left eye. It lasted only a few minutes, no nausea/vomiting, she did not take any pain medication.[name] reports concern for keloid formation in the past week and a half. She has a history of keloid formation after ear surgery years ago. She denies any dizziness, vision changes, dysarthria/dysphagia, bowel/bladder dysfunction. She has some neck/back pain from arthritis. She has numbness and stiffness in her hands and feet that she attributes to neuropathy. She was previously seen by neurologist Dr. 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 At that time, she presented with gait difficulty, distal arm and leg muscle atrophy, areflexia. Spinal fluid testing in 2015 showed elevated CSF protein 125, nerve biopsy in 2015 showed moderately severe loss of myelinated fibers, no evidence of inflammation, vasculitis, amyloid deposition. She was treated with IVIG but did not continue treatment after 2015. She ambulates with a walker at home and uses a cane today. No recent falls. Sleep is good. Memory and mood are  good. She manages her own medications. She lives with her daughter, son-in-law, and 57 year old  grandson. She does not drive.   Epilepsy Risk Factors:  Solitary intracranial metastasis in right parietal lobe s/p resection. Otherwise she had a normal birth and early development.  There is no history of febrile convulsions, CNS infections such as meningitis/encephalitis, significant traumatic brain injury, or family history of seizures.    PAST MEDICAL HISTORY: Past Medical History:  Diagnosis Date   Allergy    Anemia    Arthritis    "joints" (09/29/2013)   Breast cancer ([place])    GERD (gastroesophageal reflux disease)    Heart murmur    History of blood transfusion    "related to chemo/breast cancer" (09/29/2013)   Hypertension    Migraines    Personal history of chemotherapy    Personal history of radiation therapy    Radiation 11/05/13-12/24/13   Right breast    TMJ (temporomandibular joint syndrome)    "left; just dx'd" (09/29/2013    PAST SURGICAL HISTORY: Past Surgical History:  Procedure Laterality Date   AXILLARY LYMPH NODE BIOPSY Right 03/10/2013   Procedure: AXILLARY LYMPH NODE BIOPSY;  Surgeon: Adin Hector, MD;  Location: [place];  Service: General;  Laterality: Right;  sentinel node with blue dye   BREAST BIOPSY     BREAST LUMPECTOMY Right 09/29/2013   needle localization w/axillary LND/notes 09/29/2013   BREAST LUMPECTOMY WITH NEEDLE LOCALIZATION AND AXILLARY LYMPH NODE DISSECTION Right 09/29/2013   Procedure: RIGHT BREAST NEEDLE LOCALIZED LUMPECTOMY AND AXILLARY LYMPH NODE DISSECTION;  Surgeon: Adin Hector, MD;  Location: [facility];  Service: General;  Laterality: Right;   COLONOSCOPY N/A 04/30/2013   Procedure: COLONOSCOPY;  Surgeon: Wonda Horner, MD;  Location: [facility];  Service: Endoscopy;  Laterality: N/A;   CRANIOTOMY Right 03/11/2022   Procedure: CRANIOTOMY TUMOR EXCISION;  Surgeon: Meade Maw, MD;  Location: [facility];  Service: Neurosurgery;  Laterality: Right;   ESOPHAGOGASTRODUODENOSCOPY N/A 04/28/2013   Procedure: ESOPHAGOGASTRODUODENOSCOPY (EGD);   Surgeon: Wonda Horner, MD;  Location: [facility];  Service: Endoscopy;  Laterality: N/A;   MASTECTOMY Right 09/29/2013   "partial"   PORT-A-CATH REMOVAL  09/29/2013   PORT-A-CATH REMOVAL Left 09/29/2013   Procedure: REMOVAL PORT-A-CATH;  Surgeon: Adin Hector, MD;  Location: [place];  Service: General;  Laterality: Left;   PORTACATH PLACEMENT N/A 03/10/2013   Procedure: INSERTION PORT-A-CATH WITH FLUOROSCOPY AND ULTRASOUND;  Surgeon: Adin Hector, MD;  Location: [place];  Service: General;  Laterality: N/A;   SURAL NERVE BX Right 05/13/2014   Procedure: SURAL NERVE BIOPSY;  Surgeon: Hosie Spangle, MD;  Location: [facility];  Service: Neurosurgery;  Laterality: Right;  Right sural nerve biopsy   TONSILLECTOMY     TOTAL ABDOMINAL HYSTERECTOMY     With bilateral salpingo-oophorectomy    MEDICATIONS: Current Outpatient Medications on File Prior to Visit  Medication Sig Dispense Refill   acetaminophen (TYLENOL) 325 MG tablet Take 1-2 tablets (325-650 mg total) by mouth every 4 (four) hours as needed for mild pain.     anastrozole (ARIMIDEX) 1 MG tablet Take 1 tablet (1 mg total) by mouth daily. 90 tablet 3   Cholecalciferol (D3-1000) 25 MCG (1000 UT) capsule Take 1 capsule (1,000 Units total) by mouth daily. 30 capsule 0   ferrous sulfate 325 (65 FE) MG tablet Take 1 tablet (325 mg total) by mouth daily with breakfast. 30 tablet 3   levETIRAcetam (KEPPRA) 500 MG tablet  TAKE 1 TABLET(500 MG) BY MOUTH TWICE DAILY 180 tablet 0   levothyroxine (SYNTHROID) 50 MCG tablet Take 1 tablet (50 mcg total) by mouth daily. 30 tablet 0   LORazepam (ATIVAN) 0.5 MG tablet Take 1 tablet (0.5 mg total) by mouth every 8 (eight) hours as needed for anxiety (take 1 tablet as needed for anxiety every 8 hours). 30 tablet 0   oxyCODONE-acetaminophen (PERCOCET/ROXICET) 5-325 MG tablet Take 1 tablet by mouth every 8 (eight) hours as needed for severe pain. 90 tablet 0   potassium chloride (KLOR-CON) 10 MEQ tablet  Take 10 mEq by mouth daily.     rosuvastatin (CRESTOR) 10 MG tablet Take 1 tablet (10 mg total) by mouth daily. 30 tablet 0   vitamin B-12 (CYANOCOBALAMIN) 1000 MCG tablet Take 1 tablet (1,000 mcg total) by mouth daily. 30 tablet 0   No current facility-administered medications on file prior to visit.    ALLERGIES: Allergies  Allergen Reactions   Nsaids Nausea And Vomiting and Other (See Comments)    Pt states stomach ulcers, vomiting, and bleeding.   Penicillins Rash    Other reaction(s): Face swelling    FAMILY HISTORY: Family History  Problem Relation Age of Onset   Cancer Mother 49       Unknown type of cancer   Congestive Heart Failure Mother    Breast cancer Mother    Colon cancer Brother    Diabetes Brother 63   Heart disease Brother        AMI 05/22/2012   Congestive Heart Failure Brother    Cancer Maternal Grandmother 70       Unknown type of cancer   Breast cancer Maternal Grandmother     SOCIAL HISTORY: Social History   Socioeconomic History   Marital status: Divorced    Spouse name: n/a   Number of children: 1   Years of education: 85   Highest education level: Not on file  Occupational History   Occupation: retired    Comment: school Associate Professor   Occupation: subsitute   Tobacco Use   Smoking status: Former    Years: 1    Types: Cigarettes   Smokeless tobacco: Never   Tobacco comments:    smoked in college 1 yr   Scientific laboratory technician Use: Never used  Substance and Sexual Activity   Alcohol use: No    Comment: Wine occasionally   Drug use: No   Sexual activity: Not Currently    Partners: Male    Birth control/protection: Surgical  Other Topics Concern   Not on file  Social History Narrative   Retired, worked for [employer] system in [employer] in 7/08. Currently work as Oceanographer   Right handed    Lives with daughter and son in law    Social Determinants of Health   Financial Resource Strain: Not on file  Food  Insecurity: Not on file  Transportation Needs: Not on file  Physical Activity: Not on file  Stress: Not on file  Social Connections: Not on file  Intimate Partner Violence: Not on file     PHYSICAL EXAM: Vitals:   12/12/22 0842  BP: 125/71  Pulse: 76  SpO2: 98%   General: No acute distress Head:  Normocephalic, healed surgical site on right parietal region with dried skin seen along the surgical site Skin/Extremities: No rash, no edema Neurological Exam: Mental status: alert and oriented to person, place, and time, no dysarthria or aphasia, [employer]  of knowledge is appropriate.  Recent and remote memory are intact, 3/3 delayed recall.  Attention and concentration are normal, 5/5 WORLD backwards Cranial nerves: CN I: not tested CN II: pupils equal, round, visual fields intact CN III, IV, VI:  full range of motion, no nystagmus, no ptosis CN V: facial sensation intact CN VII: upper and lower face symmetric CN VIII: hearing intact to conversation Bulk & Tone: normal, no fasciculations. Motor: 5/5 throughout except for right foot inversion 3/5, no pronator drift. Sensation: intact to light touch, cold, pin on both UE, intact cold and pin on both LE, decreased vibration sense to left knee.  Deep Tendon Reflexes: unable to elicit throughout Cerebellar: no incoordination on finger to nose testing Gait: slow and cautious with cane, favoring right leg Tremor: none   IMPRESSION: This is a pleasant 77 year old right-handed woman with a history of hyperlipidemia, neuropathy, metastatic breast cancer with brain metastasis with initial presentation at diagnosis with confusion/apraxia and left-sided weakness. No motor component. EEG showed right hemisphere slowing, no epileptiform discharges. She has been on Levetiracetam 500mg  BID since then with no further confusional episodes since 02/2022. We discussed increased risk for seizure due to history of brain metastasis s/p surgery, would continue  Levetiracetam 500mg  BID. She is not driving. She reports one transient episode of sharp headache a week ago, continue to monitor. They will contact her neurosurgeon regarding skin changes over surgical site. Follow-up in 6 months, call for any changes.    Thank you for allowing me to participate in the care of this patient. Please do not hesitate to call for any questions or concerns.   Ellouise Newer, M.D.  CC: Dr. Izora Ribas, [place]

## 2022-12-12 NOTE — Patient Instructions (Signed)
Good to meet you.  Continue Keppra (Levetiracetam) 500mg  twice a day  2. Please contact your neurosurgeon regarding new changes over surgical site  3. Continue follow-up with Oncology  4. Follow-up in 6 months, call for any changes   Seizure Precautions: 1. If medication has been prescribed for you to prevent seizures, take it exactly as directed.  Do not stop taking the medicine without talking to your doctor first, even if you have not had a seizure in a long time.   2. Avoid activities in which a seizure would cause danger to yourself or to others.  Don't operate dangerous machinery, swim alone, or climb in high or dangerous places, such as on ladders, roofs, or girders.  Do not drive unless your doctor says you may.  3. If you have any warning that you may have a seizure, lay down in a safe place where you can't hurt yourself.    4.  No driving for 6 months from last seizure, as per [facility].   Please refer to the following link on the [place] website for more information: http://www.epilepsyfoundation.org/answerplace/Social/driving/drivingu.cfm   5.  Maintain good sleep hygiene. Avoid alcohol.  6.  Contact your doctor if you have any problems that may be related to the medicine you are taking.  7.  Call 911 and bring the patient back to the ED if:        A.  The seizure lasts longer than 5 minutes.       B.  The patient doesn't awaken shortly after the seizure  C.  The patient has new problems such as difficulty seeing, speaking or moving  D.  The patient was injured during the seizure  E.  The patient has a temperature over 102 F (39C)  F.  The patient vomited and now is having trouble breathing

## 2022-12-18 NOTE — Progress Notes (Signed)
Palliative Medicine [facility]  [phone] [phone]   Name: Tabitha Ramirez Date: 12/18/2022 MRN: [id number]  DOB: Aug 19, 1946  Patient Care Team: [place] [place] [facility] as PCP - General (Family Medicine) Magrinat, Valentino Hue, MD (Inactive) as Consulting Physician (Oncology) Pickenpack-Cousar, Arty Baumgartner, NP as Nurse Practitioner (Nurse Practitioner) Van Clines, MD as Consulting Physician (Neurology)   I connected with Tabitha Ramirez on 12/18/22 at 10:00 AM EDT by phone and verified that I am speaking with the correct person using two identifiers.   I discussed the limitations, risks, security and privacy concerns of performing an evaluation and management service by telemedicine and the availability of in-person appointments. I also discussed with the patient that there may be a patient responsible charge related to this service. The patient expressed understanding and agreed to proceed.   Other persons participating in the visit and their role in the encounter: daughter Tabitha Ramirez   Patient's location: home  Provider's location: [facility]   Chief Complaint: follow up of symptom mangement   INTERVAL HISTORY: Tabitha Ramirez is a 77 y.o. 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  Palliative ask to see for symptom management and goals of care.   SOCIAL HISTORY:     reports that she has quit smoking. Her smoking use included cigarettes. She has never used smokeless tobacco. She reports that she does not drink alcohol and does not use drugs.  ADVANCE DIRECTIVES: none in place   CODE STATUS: Full Code  PAST MEDICAL HISTORY: Past Medical History:  Diagnosis Date   Allergy    Anemia    Arthritis    "joints" (09/29/2013)   Breast cancer (HCC)    GERD (gastroesophageal reflux disease)    Heart murmur    History of blood transfusion    "related to chemo/breast cancer"  (09/29/2013)   Hypertension    Migraines    Personal history of chemotherapy    Personal history of radiation therapy    Radiation 11/05/13-12/24/13   Right breast    TMJ (temporomandibular joint syndrome)    "left; just dx'd" (09/29/2013    ALLERGIES:  is allergic to nsaids and penicillins.  MEDICATIONS:  Current Outpatient Medications  Medication Sig Dispense Refill   acetaminophen (TYLENOL) 325 MG tablet Take 1-2 tablets (325-650 mg total) by mouth every 4 (four) hours as needed for mild pain.     anastrozole (ARIMIDEX) 1 MG tablet Take 1 tablet (1 mg total) by mouth daily. 90 tablet 3   Cholecalciferol (D3-1000) 25 MCG (1000 UT) capsule Take 1 capsule (1,000 Units total) by mouth daily. 30 capsule 0   ferrous sulfate 325 (65 FE) MG tablet Take 1 tablet (325 mg total) by mouth daily with breakfast. 30 tablet 3   levETIRAcetam (KEPPRA) 500 MG tablet TAKE 1 TABLET(500 MG) BY MOUTH TWICE DAILY 180 tablet 3   levothyroxine (SYNTHROID) 50 MCG tablet Take 1 tablet (50 mcg total) by mouth daily. 30 tablet 0   LORazepam (ATIVAN) 0.5 MG tablet Take 1 tablet (0.5 mg total) by mouth every 8 (eight) hours as needed for anxiety (take 1 tablet as needed for anxiety every 8 hours). 30 tablet 0   oxyCODONE-acetaminophen (PERCOCET/ROXICET) 5-325 MG tablet Take 1 tablet by mouth every 8 (eight) hours as needed for severe pain. 90 tablet 0   potassium chloride (KLOR-CON) 10 MEQ tablet Take 10 mEq by mouth daily.     rosuvastatin (CRESTOR)  10 MG tablet Take 1 tablet (10 mg total) by mouth daily. 30 tablet 0   vitamin B-12 (CYANOCOBALAMIN) 1000 MCG tablet Take 1 tablet (1,000 mcg total) by mouth daily. 30 tablet 0   No current facility-administered medications for this visit.    VITAL SIGNS: T 98.5, HR 84, R 16, BP 163/87   Estimated body mass index is 24.09 kg/m as calculated from the following:   Height as of 12/12/22: 5\' 3"  (1.6 m).   Weight as of 12/12/22: 136 lb (61.7 kg).   PERFORMANCE STATUS  (ECOG) : 1 - Symptomatic but completely ambulatory    IMPRESSION: I connected with Tabitha Ramirez and her daughter, Tabitha Ramirez by phone. No acute distress identified. Denies nausea, vomiting, constipation, or diarrhea.   1.  Neuropathic and arthritic pain  Patient reports her pain is well controlled on current regimen. Fluctuates from day to day depending on level of activity.   We discussed her current regimen of oxycodone 5/325. She is tolerating well. Does not have to take around the clock. Some days are better than others. No changes needed at this time.    She understands we will make no changes and continue to closely monitor.    PLAN: Continue Oxycodone/APAP 5/325 mg every 8 hours as needed for pain Continue Lorazepam 0.5 mg as needed for anxiety.  Continue Ensure supplements and avoid foods with strong/unpleasant odors for patient. We will continue to closely monitor and adjust medications for symptom management. Ongoing goals of care discussions and support as needed I will plan to see patient back in 4-6 weeks in collaboration with her oncology appointments.  Daughter knows to contact our office sooner if needed.   Patient expressed understanding and was in agreement with this plan. She also understands that She can call the clinic at any time with any questions, concerns, or complaints.   Any controlled substances utilized were prescribed in the context of palliative care. PDMP has been reviewed.    Time Total: 20 min  Visit consisted of counseling and education dealing with the complex and emotionally intense issues of symptom management and palliative care in the setting of serious and potentially life-threatening illness.Greater than 50%  of this time was spent counseling and coordinating care related to the above assessment and plan.  Willette Alma, AGPCNP-BC  Palliative Medicine Team/[facility]

## 2022-12-19 ENCOUNTER — Telehealth: Payer: Self-pay | Admitting: Radiation Therapy

## 2022-12-19 ENCOUNTER — Other Ambulatory Visit: Payer: Self-pay | Admitting: Radiation Therapy

## 2022-12-19 NOTE — Telephone Encounter (Signed)
Called and left a detailed message for patient. Provided dates and times for upcoming brain MRI in May and TELEPHONE follow-up to review those results. My contact information was included with a request to call back if pt has questions or conflicts with the scheduled May follow-up visits.  Mont Dutton R.T.(R)(T) Radiation Special Procedures Navigator

## 2022-12-21 ENCOUNTER — Encounter: Payer: Self-pay | Admitting: Nurse Practitioner

## 2022-12-21 ENCOUNTER — Inpatient Hospital Stay: Payer: Medicare PPO | Attending: Hematology and Oncology | Admitting: Nurse Practitioner

## 2022-12-21 DIAGNOSIS — F419 Anxiety disorder, unspecified: Secondary | ICD-10-CM | POA: Diagnosis not present

## 2022-12-21 DIAGNOSIS — G893 Neoplasm related pain (acute) (chronic): Secondary | ICD-10-CM

## 2022-12-21 DIAGNOSIS — Z515 Encounter for palliative care: Secondary | ICD-10-CM

## 2022-12-27 ENCOUNTER — Encounter: Payer: Self-pay | Admitting: Physical Medicine & Rehabilitation

## 2022-12-27 ENCOUNTER — Encounter: Payer: Medicare PPO | Attending: Physical Medicine & Rehabilitation | Admitting: Physical Medicine & Rehabilitation

## 2022-12-27 VITALS — BP 142/77 | HR 70 | Ht 63.0 in | Wt 137.0 lb

## 2022-12-27 DIAGNOSIS — C50911 Malignant neoplasm of unspecified site of right female breast: Secondary | ICD-10-CM | POA: Diagnosis not present

## 2022-12-27 DIAGNOSIS — T451X5A Adverse effect of antineoplastic and immunosuppressive drugs, initial encounter: Secondary | ICD-10-CM | POA: Insufficient documentation

## 2022-12-27 DIAGNOSIS — G620 Drug-induced polyneuropathy: Secondary | ICD-10-CM | POA: Diagnosis present

## 2022-12-27 DIAGNOSIS — C7931 Secondary malignant neoplasm of brain: Secondary | ICD-10-CM | POA: Insufficient documentation

## 2022-12-27 NOTE — Progress Notes (Signed)
Subjective:    Patient ID: Tabitha Ramirez, female    DOB: Jun 15, 1946, 77 y.o.   MRN: [id number]  HPI  Tabitha Ramirez is here in follow-up of her metastatic breast cancer to the brain.  I last saw her in July 2023 while she still was in inpatient rehab.  She has been followed by oncology.  Most recent scan from December of last year showed no evidence of metastatic disease.  She has been followed by neurology for seizures and has had no episodes apparently since last summer.  She remains on Keppra 500 mg twice daily.  She is not driving.  She still has periodic headaches.  Her biggest pain complaints related to neuropathy in her feet and hands as well as some arthritis there as well.  She has tried Lyrica and gabapentin in the past apparently but did not tolerate these very well.  Functional standpoint she has improved quite nicely.  She completed a long course of outpatient physical therapy last fall.  She is walking with a cane and sometimes a rolling walker for longer distances.  She takes her time in is generally careful.  She has not had any falls or mishaps.  She is independent for basic ADLs.  Pain Inventory Average Pain 5 Pain Right Now 6 My pain is sharp, tingling, and aching  LOCATION OF PAIN  B/L Breast, feet  BOWEL Number of stools per week: 14 Oral laxative use No  Type of laxative n/a Enema or suppository use No  History of colostomy No  Incontinent No   BLADDER Pads In and out cath, frequency n/a Able to self cath  n/a Bladder incontinence Yes  Frequent urination No  Leakage with coughing No  Difficulty starting stream No  Incomplete bladder emptying No    Mobility use a cane how many minutes can you walk? 15 ability to climb steps?  no do you drive?  no Do you have any goals in this area?  no  Function retired  Neuro/Psych bladder control problems trouble walking  Prior Studies New patient  Physicians involved in your care Palliative  Care   Family History  Problem Relation Age of Onset   Cancer Mother 50       Unknown type of cancer   Congestive Heart Failure Mother    Breast cancer Mother    Colon cancer Brother    Diabetes Brother 50   Heart disease Brother        AMI 05/22/2012   Congestive Heart Failure Brother    Cancer Maternal Grandmother 70       Unknown type of cancer   Breast cancer Maternal Grandmother    Social History   Socioeconomic History   Marital status: Divorced    Spouse name: n/a   Number of children: 1   Years of education: 86   Highest education level: Not on file  Occupational History   Occupation: retired    Comment: school Associate Professor   Occupation: subsitute   Tobacco Use   Smoking status: Former    Years: 1    Types: Cigarettes   Smokeless tobacco: Never   Tobacco comments:    smoked in college 1 yr   Scientific laboratory technician Use: Never used  Substance and Sexual Activity   Alcohol use: No    Comment: Wine occasionally   Drug use: No   Sexual activity: Not Currently    Partners: Male    Birth control/protection: Surgical  Other Topics Concern   Not on file  Social History Narrative   Retired, worked for [employer] system in [employer] in 7/08. Currently work as Oceanographer   Right handed    Lives with daughter and son in law    Social Determinants of Health   Financial Resource Strain: Not on file  Food Insecurity: Not on file  Transportation Needs: Not on file  Physical Activity: Not on file  Stress: Not on file  Social Connections: Not on file   Past Surgical History:  Procedure Laterality Date   AXILLARY LYMPH NODE BIOPSY Right 03/10/2013   Procedure: AXILLARY LYMPH NODE BIOPSY;  Surgeon: Adin Hector, MD;  Location: [place];  Service: General;  Laterality: Right;  sentinel node with blue dye   BREAST BIOPSY     BREAST LUMPECTOMY Right 09/29/2013   needle localization w/axillary LND/notes 09/29/2013   BREAST LUMPECTOMY WITH NEEDLE  LOCALIZATION AND AXILLARY LYMPH NODE DISSECTION Right 09/29/2013   Procedure: RIGHT BREAST NEEDLE LOCALIZED LUMPECTOMY AND AXILLARY LYMPH NODE DISSECTION;  Surgeon: Adin Hector, MD;  Location: [place];  Service: General;  Laterality: Right;   COLONOSCOPY N/A 04/30/2013   Procedure: COLONOSCOPY;  Surgeon: Wonda Horner, MD;  Location: [facility];  Service: Endoscopy;  Laterality: N/A;   CRANIOTOMY Right 03/11/2022   Procedure: CRANIOTOMY TUMOR EXCISION;  Surgeon: Meade Maw, MD;  Location: [facility];  Service: Neurosurgery;  Laterality: Right;   ESOPHAGOGASTRODUODENOSCOPY N/A 04/28/2013   Procedure: ESOPHAGOGASTRODUODENOSCOPY (EGD);  Surgeon: Wonda Horner, MD;  Location: [facility];  Service: Endoscopy;  Laterality: N/A;   MASTECTOMY Right 09/29/2013   "partial"   PORT-A-CATH REMOVAL  09/29/2013   PORT-A-CATH REMOVAL Left 09/29/2013   Procedure: REMOVAL PORT-A-CATH;  Surgeon: Adin Hector, MD;  Location: [place];  Service: General;  Laterality: Left;   PORTACATH PLACEMENT N/A 03/10/2013   Procedure: INSERTION PORT-A-CATH WITH FLUOROSCOPY AND ULTRASOUND;  Surgeon: Adin Hector, MD;  Location: [place];  Service: General;  Laterality: N/A;   SURAL NERVE BX Right 05/13/2014   Procedure: SURAL NERVE BIOPSY;  Surgeon: Hosie Spangle, MD;  Location: [facility];  Service: Neurosurgery;  Laterality: Right;  Right sural nerve biopsy   TONSILLECTOMY     TOTAL ABDOMINAL HYSTERECTOMY     With bilateral salpingo-oophorectomy   Past Medical History:  Diagnosis Date   Allergy    Anemia    Arthritis    "joints" (09/29/2013)   Breast cancer ([place])    GERD (gastroesophageal reflux disease)    Heart murmur    History of blood transfusion    "related to chemo/breast cancer" (09/29/2013)   Hypertension    Migraines    Personal history of chemotherapy    Personal history of radiation therapy    Radiation 11/05/13-12/24/13   Right breast    TMJ (temporomandibular joint syndrome)    "left; just dx'd"  (09/29/2013   BP (!) 142/77   Pulse 70   Ht 5\' 3"  (1.6 m)   Wt 137 lb (62.1 kg)   SpO2 98%   BMI 24.27 kg/m   Opioid Risk Score:   Fall Risk Score:  `1  Depression screen [facility] 2/9     12/27/2022   10:11 AM 04/20/2022   10:56 AM 02/14/2016   11:08 AM 12/01/2015   12:48 PM 03/03/2015   12:20 PM 02/20/2014   11:07 AM 11/29/2011    3:11 PM  Depression screen PHQ 2/9  Decreased Interest 0 0  0 0 0 0 0  Down, Depressed, Hopeless 0 0 0 0 0 0 0  PHQ - 2 Score 0 0 0 0 0 0 0     Review of Systems  Endocrine:       High blood sugar  Musculoskeletal:        B/L foot pain, breast pain  All other systems reviewed and are negative.      Objective:   Physical Exam  Gen: no distress, normal appearing HEENT: oral mucosa pink and moist, NCAT Cardio: Reg rate Chest: normal effort, normal rate of breathing Abd: soft, non-distended Ext: no edema Psych: pleasant, normal affect Skin: intact Neuro: Alert and oriented x 3. Normal insight and awareness.  She demonstrates mild delays in processing but is generally quite appropriate.  Intact Memory. Normal language and speech. Cranial nerve exam unremarkable. MMT: 5/5 UE except for hands which are 4/5. LE 4/5 prox to distal.  Patient was stocking glove sensory loss from the upper calf downwards as well as below each wrist.  The tendon reflexes were 1+.  Patient ambulated with her cane and took fairly short steps but was very stable.  She is able to change direction without any problems. Musculoskeletal: Chronic arthritic changes in both hands.  Reasonable posture.      Assessment & Plan:   1.  Functional deficits secondary to metastatic breast cancer to the brain with history of right parietal craniotomy and resection of brain tumor March 11, 2022 2.  Chemotherapy induced peripheral neuropathy.    Plan: She is really doing quite well at this point.  I nothing further to offer from a rehab standpoint.  She should continue using her cane and walker  as she is doing.  She uses good commonsense and paces herself when needed. We did discuss a potential trial of Qutenza here at the office.  I discussed how this patch worked and provided the patient some supplementary material for review.  She will let me know if she wants to proceed with this.  Otherwise she can follow up with her primary as well as oncology and neurology.  She should remain on her Keppra as advised.  I will see her back here in the office as needed or if she wishes to pursue the [place].  20 minutes of time was spent with the patient and review of her case as well as examination and discussion of treatment plan.

## 2022-12-27 NOTE — Patient Instructions (Addendum)
ALWAYS FEEL FREE TO CALL OUR OFFICE WITH ANY PROBLEMS OR QUESTIONS [phone])  **PLEASE NOTE** ALL MEDICATION REFILL REQUESTS (INCLUDING CONTROLLED SUBSTANCES) NEED TO BE MADE AT LEAST 7 DAYS PRIOR TO REFILL BEING DUE. ANY REFILL REQUESTS INSIDE THAT TIME FRAME MAY RESULT IN DELAYS IN RECEIVING YOUR PRESCRIPTION.     PLEASE LET ME KNOW IF YOU WOULD LIKE TO PURSUE THE QUTENZA PATCHES.

## 2023-01-10 ENCOUNTER — Other Ambulatory Visit: Payer: Self-pay | Admitting: Nurse Practitioner

## 2023-01-10 DIAGNOSIS — C50911 Malignant neoplasm of unspecified site of right female breast: Secondary | ICD-10-CM

## 2023-01-10 DIAGNOSIS — G893 Neoplasm related pain (acute) (chronic): Secondary | ICD-10-CM

## 2023-01-10 DIAGNOSIS — Z515 Encounter for palliative care: Secondary | ICD-10-CM

## 2023-01-10 MED ORDER — OXYCODONE-ACETAMINOPHEN 5-325 MG PO TABS: 1.0000 | ORAL_TABLET | Freq: Three times a day (TID) | ORAL | 0 refills | Status: DC | PRN

## 2023-01-30 ENCOUNTER — Ambulatory Visit
Admission: RE | Admit: 2023-01-30 | Discharge: 2023-01-30 | Disposition: A | Discharge status: Home / Self Care | Payer: Medicare PPO | Source: Ambulatory Visit | Attending: Radiation Oncology | Admitting: Radiation Oncology

## 2023-01-30 DIAGNOSIS — C7949 Secondary malignant neoplasm of other parts of nervous system: Secondary | ICD-10-CM

## 2023-01-30 MED ORDER — GADOPICLENOL 0.5 MMOL/ML IV SOLN
6.0000 mL | Freq: Once | INTRAVENOUS | Status: AC | PRN
  Administered 2023-01-30: 6 mL via INTRAVENOUS

## 2023-02-05 ENCOUNTER — Ambulatory Visit
Admission: RE | Admit: 2023-02-05 | Discharge: 2023-02-05 | Disposition: A | Discharge status: Home / Self Care | Payer: Medicare PPO | Source: Ambulatory Visit | Attending: Radiation Oncology | Admitting: Radiation Oncology

## 2023-02-05 ENCOUNTER — Encounter: Payer: Self-pay | Admitting: Radiation Oncology

## 2023-02-05 DIAGNOSIS — C50911 Malignant neoplasm of unspecified site of right female breast: Secondary | ICD-10-CM

## 2023-02-06 ENCOUNTER — Other Ambulatory Visit: Payer: Self-pay | Admitting: Radiation Therapy

## 2023-02-06 DIAGNOSIS — C7931 Secondary malignant neoplasm of brain: Secondary | ICD-10-CM

## 2023-02-06 NOTE — Progress Notes (Signed)
Radiation Oncology         [phone] ________________________________   Name: Tabitha Ramirez MRN: [id number]         Date: 05/31/2022                        DOB: Sep 11, 1946   Post Treatment Note   CC: [place], [place] [facility]   Diagnosis:   77 yo woman s/p resection and post-operative fractionated SRS of a solitary brain metastasis secondary to triple negative breast cancer.     Interval Since Last Radiation: 9 months 04/27/22 - 05/03/22:  Post-op fractionated SRS to the Right parietal resection cavity to 27 Gy in 3 fractions of 9 Gy each.    11/05/13 - 12/12/13:   Right breast / 45 Gray @ 1.8 Wallace Cullens per fraction x 25 fractions Michell Heinrich) Right supraclavicular fossa / 45 Gy @1 .8 Gy per fraction x 25 fractions Michell Heinrich)   12/15/13 - 12/24/13: Right breast boost / 16 Gray at 2 Wallace Cullens per fraction x 8 fractions Michell Heinrich)   Narrative:   She tolerated radiation treatment relatively well without any ill side effects.                               On review of systems, obtained through her daughter, Tabitha Ramirez, the patient has continued doing well in general and remains without complaints. She specifically denies persistent or frequent headaches, changes in her vision, muscle weakness, recent/new onset seizures. She continues to experience breast pain, which she contributes to her history of breast cancer. She is also followed with Lowella Bandy, NP in palliative care, for symptom management/pain management  of her chemo-induced neuropathic pain and she reports that this has remained well controlled with her current regimen. She continues in routine follow up with Dr. Pamelia Hoit for continued surveillance of her systemic disease- currently on observation only with restaging CT C/A/P from 09/06/22 showing no evidence of active disease.  She is off all steroids since completing the stereotactic radiation and has not had any issues or complaints. Her daughter is on the phone with her  for our visit today and overall, they are pleased with her progress to date. She had a recent follow up MRI brain scan on 01/30/23 and we reviewed these results by telephone today.  ALLERGIES:  is allergic to nsaids and penicillins.   Meds:       Current Outpatient Medications  Medication Sig Dispense Refill   acetaminophen (TYLENOL) 325 MG tablet Take 1-2 tablets (325-650 mg total) by mouth every 4 (four) hours as needed for mild pain.       anastrozole (ARIMIDEX) 1 MG tablet Take 1 tablet (1 mg total) by mouth daily. 90 tablet 3   Cholecalciferol (D3-1000) 25 MCG (1000 UT) capsule Take 1 capsule (1,000 Units total) by mouth daily. 30 capsule 0   DULoxetine (CYMBALTA) 30 MG capsule Take 1 capsule (30 mg total) by mouth daily. 30 capsule 0   ferrous sulfate 325 (65 FE) MG tablet Take 1 tablet (325 mg total) by mouth daily with breakfast. 30 tablet 3   levETIRAcetam (KEPPRA) 500 MG tablet Take 1 tablet (500 mg total) by mouth 2 (two) times daily. 120 tablet 0   levothyroxine (SYNTHROID) 50 MCG tablet Take 1 tablet (50 mcg total) by mouth daily. 30 tablet 0   LORazepam (ATIVAN) 0.5 MG tablet Take 1 tablet (0.5 mg total)  by mouth as needed for anxiety (take 1 tablet 30 minutes prior to MRI scans and radiation visits, may repeat once, just prior to procedures if needed). 30 tablet 0   oxyCODONE-acetaminophen (PERCOCET/ROXICET) 5-325 MG tablet Take 1 tablet by mouth every 8 (eight) hours as needed for severe pain. 60 tablet 0   potassium chloride (KLOR-CON) 10 MEQ tablet Take 10 mEq by mouth daily. (Patient not taking: Reported on 04/21/2022)       rosuvastatin (CRESTOR) 10 MG tablet Take 1 tablet (10 mg total) by mouth daily. 30 tablet 0   vitamin B-12 (CYANOCOBALAMIN) 1000 MCG tablet Take 1 tablet (1,000 mcg total) by mouth daily. 30 tablet 0    No current facility-administered medications for this encounter.      Physical Findings: Vitals were not taken for this visit.  Unsteady gait - patient  uses cane   Lab Findings: Recent Labs       Lab Results  Component Value Date    WBC 7.4 03/27/2022    HGB 9.1 (L) 03/27/2022    HCT 27.7 (L) 03/27/2022    MCV 102.6 (H) 03/27/2022    PLT 294 03/27/2022          Radiographic Findings: MR Brain W Wo Contrast 01/30/2023  Narrative & Impression  CLINICAL DATA:  Metastatic disease.   EXAM: MRI HEAD WITHOUT AND WITH CONTRAST   TECHNIQUE: Multiplanar, multiecho pulse sequences of the brain and surrounding structures were obtained without and with intravenous contrast.   CONTRAST:  6 cc Vueway   COMPARISON:  Brain MRI most recently 11/02/2022.   FINDINGS: Brain: Postsurgical changes reflecting right parietal craniotomy for mass resection are again seen. Dural thickening and enhancement underlying the craniotomy is similar to the prior studies. The cystic resection cavity measures 2 2.3 cm x 2.0 cm, not significantly changed. Smooth enhancement at the periphery of the cavity is similar to the prior study, measuring up to 2-3 mm in thickness antero inferiorly (16-14). FLAIR signal abnormality surrounding the cavity is unchanged. There is no new or progressive diffusion restriction. Postsurgical blood products in the cavity are unchanged. There is no new or progressive mass effect.   The punctate focus of enhancement in the left frontal cortex is unchanged without surrounding edema or mass effect (13-89).   There are no new lesions.   There is no acute intracranial hemorrhage, extra-axial fluid collection, or acute infarct.   Parenchymal volume is stable. The ventricles are stable in size. Patchy and confluent FLAIR signal abnormality elsewhere in the supratentorial white matter and brainstem likely reflecting sequela of chronic small-vessel ischemic change is stable.   The pituitary and suprasellar region are normal. There is no midline shift.   Vascular: Normal flow voids.   Skull and upper cervical spine: There  is no suspicious marrow signal abnormality.   Sinuses/Orbits: The paranasal sinuses are clear. The globes and orbits are unremarkable.   Other: None.   IMPRESSION: Stable exam with no new or progressive enhancement at the right parietal resection site, and unchanged punctate cortical lesion in the left frontal lobe. No new lesions.     Electronically Signed   By: Lesia Hausen M.D.   On: 02/02/2023 10:12     Impression/Plan: 1.         77 yo woman s/p resection and post-operative fractionated SRS of a solitary brain metastasis secondary to triple negative breast cancer.  She has recovered well from the effects of her postoperative fractionated stereotactic radiotherapy and is  currently without complaints from that standpoint.  We reviewed and discussed her MRI brain results today by telephone. This recent brain MRI from 01/30/23 shows normal post operative changes, s/p right parietal craniotomy and no evidence of disease progression. The small, 2 mm rounded focus of enhancement along the anterolateral left frontal lobe remains stable. This has been visualized on prior imaging dating back to 03/09/2022 and is favored to reflect a focus of prominent vascular enhancement as opposed to any viable metastasis but we will continue to monitor this closely on future scans.  We will continue with serial MRI brain scans every 3 months  continue to closely monitor for any evidence of disease recurrence or progression.  I will plan to call them following each scan to discuss the results and any recommendations from the multidisciplinary brain conference but they know that they are welcome to call at anytime in the interim with any questions or concerns.   2.         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 She was treated with neoadjuvant chemotherapy followed by lumpectomy on 09/29/2013 and adjuvant breast radiotherapy 11/12/2013 through 12/24/2013.  Her most recent disease  restaging CT C/A/P 09/06/2022 was negative for any obvious active primary or metastatic disease. Patient had a follow up visit with Dr. Pamelia Hoit 09/11/22 and the plan is to continue in observation only, with a CT C/A/P in one year and follow-up visit with him thereafter. She will also continue in routine follow up with Lowella Bandy, NP in palliative care for symptom/neuropathic pain management. We will continue to follow expectantly.  I personally spent 20 minutes in this encounter including chart review, reviewing radiological studies, telephone discussion with the patient and her daughter, Tabitha Ramirez, entering orders and completing documentation.    Joyice Faster, PA-C [place]  [facility] Radiation Oncology Physician Assistant Direct Dial: [phone]

## 2023-02-07 ENCOUNTER — Other Ambulatory Visit: Payer: Self-pay | Admitting: Nurse Practitioner

## 2023-02-07 DIAGNOSIS — G893 Neoplasm related pain (acute) (chronic): Secondary | ICD-10-CM

## 2023-02-07 DIAGNOSIS — C50911 Malignant neoplasm of unspecified site of right female breast: Secondary | ICD-10-CM

## 2023-02-07 DIAGNOSIS — Z515 Encounter for palliative care: Secondary | ICD-10-CM

## 2023-02-07 MED ORDER — OXYCODONE-ACETAMINOPHEN 5-325 MG PO TABS: 1.0000 | ORAL_TABLET | Freq: Three times a day (TID) | ORAL | 0 refills | Status: DC | PRN

## 2023-02-09 NOTE — Progress Notes (Unsigned)
Palliative Medicine [facility]  Telephone:([phone]) [phone] [phone]) [phone]   Name: Tabitha Ramirez Date: 02/09/2023 MRN: [id number]  DOB: January 20, 1946  Patient Care Team: [place] [place] [facility] as PCP - General (Family Medicine) Magrinat, Valentino Hue, MD (Inactive) as Consulting Physician (Oncology) Pickenpack-Cousar, Arty Baumgartner, NP as Nurse Practitioner (Nurse Practitioner) Van Clines, MD as Consulting Physician (Neurology)   INTERVAL HISTORY: Tabitha Ramirez is a 77 y.o. 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  Palliative ask to see for symptom management and goals of care.   SOCIAL HISTORY:     reports that she has quit smoking. Her smoking use included cigarettes. She has never used smokeless tobacco. She reports that she does not drink alcohol and does not use drugs.  ADVANCE DIRECTIVES: none in place   CODE STATUS: Full Code  PAST MEDICAL HISTORY: Past Medical History:  Diagnosis Date   Allergy    Anemia    Arthritis    "joints" (09/29/2013)   Breast cancer (HCC)    GERD (gastroesophageal reflux disease)    Heart murmur    History of blood transfusion    "related to chemo/breast cancer" (09/29/2013)   Hypertension    Migraines    Personal history of chemotherapy    Personal history of radiation therapy    Radiation 11/05/13-12/24/13   Right breast    TMJ (temporomandibular joint syndrome)    "left; just dx'd" (09/29/2013    ALLERGIES:  is allergic to nsaids and penicillins.  MEDICATIONS:  Current Outpatient Medications  Medication Sig Dispense Refill   acetaminophen (TYLENOL) 325 MG tablet Take 1-2 tablets (325-650 mg total) by mouth every 4 (four) hours as needed for mild pain.     anastrozole (ARIMIDEX) 1 MG tablet Take 1 tablet (1 mg total) by mouth daily. 90 tablet 3   Cholecalciferol (D3-1000) 25 MCG (1000 UT) capsule Take 1 capsule (1,000 Units total) by mouth daily.  30 capsule 0   ferrous sulfate 325 (65 FE) MG tablet Take 1 tablet (325 mg total) by mouth daily with breakfast. 30 tablet 3   levETIRAcetam (KEPPRA) 500 MG tablet TAKE 1 TABLET(500 MG) BY MOUTH TWICE DAILY 180 tablet 3   levothyroxine (SYNTHROID) 50 MCG tablet Take 1 tablet (50 mcg total) by mouth daily. 30 tablet 0   LORazepam (ATIVAN) 0.5 MG tablet Take 1 tablet (0.5 mg total) by mouth every 8 (eight) hours as needed for anxiety (take 1 tablet as needed for anxiety every 8 hours). 30 tablet 0   oxyCODONE-acetaminophen (PERCOCET/ROXICET) 5-325 MG tablet Take 1 tablet by mouth every 8 (eight) hours as needed for severe pain. 90 tablet 0   potassium chloride (KLOR-CON) 10 MEQ tablet Take 10 mEq by mouth daily.     rosuvastatin (CRESTOR) 10 MG tablet Take 1 tablet (10 mg total) by mouth daily. 30 tablet 0   vitamin B-12 (CYANOCOBALAMIN) 1000 MCG tablet Take 1 tablet (1,000 mcg total) by mouth daily. 30 tablet 0   No current facility-administered medications for this visit.    VITAL SIGNS: T 98.5, HR 84, R 16, BP 163/87   Estimated body mass index is 24.27 kg/m as calculated from the following:   Height as of 12/27/22: 5\' 3"  (1.6 m).   Weight as of 12/27/22: 137 lb (62.1 kg).   PERFORMANCE STATUS (ECOG) : 1 - Symptomatic but completely ambulatory    IMPRESSION:   1.  Neuropathic and arthritic pain  Patient reports her pain is well controlled on current regimen. Fluctuates from day to day depending on level of activity.   We discussed her current regimen of oxycodone 5/325. She is tolerating well. Does not have to take around the clock. Some days are better than others. No changes needed at this time.    She understands we will make no changes and continue to closely monitor.    PLAN: Continue Oxycodone/APAP 5/325 mg every 8 hours as needed for pain Continue Lorazepam 0.5 mg as needed for anxiety.  Continue Ensure supplements and avoid foods with strong/unpleasant odors for  patient. We will continue to closely monitor and adjust medications for symptom management. Ongoing goals of care discussions and support as needed I will plan to see patient back in 4-6 weeks in collaboration with her oncology appointments.  Daughter knows to contact our office sooner if needed.   Patient expressed understanding and was in agreement with this plan. She also understands that She can call the clinic at any time with any questions, concerns, or complaints.   Any controlled substances utilized were prescribed in the context of palliative care. PDMP has been reviewed.    Time Total: 20 min  Visit consisted of counseling and education dealing with the complex and emotionally intense issues of symptom management and palliative care in the setting of serious and potentially life-threatening illness.Greater than 50%  of this time was spent counseling and coordinating care related to the above assessment and plan.  Willette Alma, AGPCNP-BC  Palliative Medicine Team/[facility]

## 2023-02-10 ENCOUNTER — Inpatient Hospital Stay (HOSPITAL_COMMUNITY)
Admission: EM | Admit: 2023-02-10 | Discharge: 2023-02-13 | DRG: 100 | Disposition: A | Discharge status: Home Health | Payer: Medicare PPO | Attending: Internal Medicine | Admitting: Internal Medicine

## 2023-02-10 ENCOUNTER — Inpatient Hospital Stay (HOSPITAL_COMMUNITY): Payer: Medicare PPO

## 2023-02-10 ENCOUNTER — Emergency Department (HOSPITAL_COMMUNITY): Payer: Medicare PPO

## 2023-02-10 ENCOUNTER — Encounter: Payer: Self-pay | Admitting: Urology

## 2023-02-10 ENCOUNTER — Other Ambulatory Visit: Payer: Self-pay

## 2023-02-10 DIAGNOSIS — G928 Other toxic encephalopathy: Secondary | ICD-10-CM | POA: Diagnosis present

## 2023-02-10 DIAGNOSIS — Z923 Personal history of irradiation: Secondary | ICD-10-CM

## 2023-02-10 DIAGNOSIS — E039 Hypothyroidism, unspecified: Secondary | ICD-10-CM | POA: Diagnosis present

## 2023-02-10 DIAGNOSIS — M1990 Unspecified osteoarthritis, unspecified site: Secondary | ICD-10-CM | POA: Diagnosis present

## 2023-02-10 DIAGNOSIS — E785 Hyperlipidemia, unspecified: Secondary | ICD-10-CM | POA: Diagnosis present

## 2023-02-10 DIAGNOSIS — Z853 Personal history of malignant neoplasm of breast: Secondary | ICD-10-CM | POA: Diagnosis not present

## 2023-02-10 DIAGNOSIS — Z803 Family history of malignant neoplasm of breast: Secondary | ICD-10-CM

## 2023-02-10 DIAGNOSIS — G43909 Migraine, unspecified, not intractable, without status migrainosus: Secondary | ICD-10-CM | POA: Diagnosis present

## 2023-02-10 DIAGNOSIS — E876 Hypokalemia: Secondary | ICD-10-CM | POA: Diagnosis present

## 2023-02-10 DIAGNOSIS — Z880 Allergy status to penicillin: Secondary | ICD-10-CM

## 2023-02-10 DIAGNOSIS — G40901 Epilepsy, unspecified, not intractable, with status epilepticus: Secondary | ICD-10-CM | POA: Diagnosis not present

## 2023-02-10 DIAGNOSIS — Z87891 Personal history of nicotine dependence: Secondary | ICD-10-CM | POA: Diagnosis not present

## 2023-02-10 DIAGNOSIS — Z9011 Acquired absence of right breast and nipple: Secondary | ICD-10-CM

## 2023-02-10 DIAGNOSIS — Z79890 Hormone replacement therapy (postmenopausal): Secondary | ICD-10-CM | POA: Diagnosis not present

## 2023-02-10 DIAGNOSIS — C50911 Malignant neoplasm of unspecified site of right female breast: Secondary | ICD-10-CM | POA: Diagnosis present

## 2023-02-10 DIAGNOSIS — G9341 Metabolic encephalopathy: Secondary | ICD-10-CM | POA: Diagnosis present

## 2023-02-10 DIAGNOSIS — Z79899 Other long term (current) drug therapy: Secondary | ICD-10-CM | POA: Diagnosis not present

## 2023-02-10 DIAGNOSIS — Z90710 Acquired absence of both cervix and uterus: Secondary | ICD-10-CM | POA: Diagnosis not present

## 2023-02-10 DIAGNOSIS — D539 Nutritional anemia, unspecified: Secondary | ICD-10-CM | POA: Diagnosis present

## 2023-02-10 DIAGNOSIS — R569 Unspecified convulsions: Secondary | ICD-10-CM | POA: Diagnosis present

## 2023-02-10 DIAGNOSIS — Z90722 Acquired absence of ovaries, bilateral: Secondary | ICD-10-CM

## 2023-02-10 DIAGNOSIS — Z79811 Long term (current) use of aromatase inhibitors: Secondary | ICD-10-CM | POA: Diagnosis not present

## 2023-02-10 DIAGNOSIS — I10 Essential (primary) hypertension: Secondary | ICD-10-CM | POA: Diagnosis present

## 2023-02-10 DIAGNOSIS — C7931 Secondary malignant neoplasm of brain: Secondary | ICD-10-CM | POA: Diagnosis present

## 2023-02-10 DIAGNOSIS — Z886 Allergy status to analgesic agent status: Secondary | ICD-10-CM

## 2023-02-10 DIAGNOSIS — Z8249 Family history of ischemic heart disease and other diseases of the circulatory system: Secondary | ICD-10-CM

## 2023-02-10 DIAGNOSIS — T424X5A Adverse effect of benzodiazepines, initial encounter: Secondary | ICD-10-CM | POA: Diagnosis present

## 2023-02-10 DIAGNOSIS — K219 Gastro-esophageal reflux disease without esophagitis: Secondary | ICD-10-CM | POA: Diagnosis present

## 2023-02-10 DIAGNOSIS — Z9221 Personal history of antineoplastic chemotherapy: Secondary | ICD-10-CM | POA: Diagnosis not present

## 2023-02-10 DIAGNOSIS — R011 Cardiac murmur, unspecified: Secondary | ICD-10-CM | POA: Diagnosis present

## 2023-02-10 DIAGNOSIS — Z9079 Acquired absence of other genital organ(s): Secondary | ICD-10-CM

## 2023-02-10 DIAGNOSIS — D509 Iron deficiency anemia, unspecified: Secondary | ICD-10-CM | POA: Diagnosis present

## 2023-02-10 LAB — COMPREHENSIVE METABOLIC PANEL
ALT: 24 U/L (ref 0–44)
AST: 25 U/L (ref 15–41)
Albumin: 3.5 g/dL (ref 3.5–5.0)
Alkaline Phosphatase: 74 U/L (ref 38–126)
Anion gap: 14 (ref 5–15)
BUN: 24 mg/dL — ABNORMAL HIGH (ref 8–23)
CO2: 23 mmol/L (ref 22–32)
Calcium: 9.3 mg/dL (ref 8.9–10.3)
Chloride: 102 mmol/L (ref 98–111)
Creatinine, Ser: 0.93 mg/dL (ref 0.44–1.00)
GFR, Estimated: >60 mL/min (ref >60)
Glucose, Bld: 77 mg/dL (ref 70–99)
Potassium: 3 mmol/L — ABNORMAL LOW (ref 3.5–5.1)
Sodium: 139 mmol/L (ref 135–145)
Total Bilirubin: 0.4 mg/dL (ref 0.3–1.2)
Total Protein: 7 g/dL (ref 6.5–8.1)

## 2023-02-10 LAB — DIFFERENTIAL
Abs Immature Granulocytes: 0.02 10*3/uL (ref 0.00–0.07)
Basophils Absolute: 0.1 10*3/uL (ref 0.0–0.1)
Basophils Relative: 1 %
Eosinophils Absolute: 0.5 10*3/uL (ref 0.0–0.5)
Eosinophils Relative: 6 %
Immature Granulocytes: 0 %
Lymphocytes Relative: 31 %
Lymphs Abs: 2.4 10*3/uL (ref 0.7–4.0)
Monocytes Absolute: 0.7 10*3/uL (ref 0.1–1.0)
Monocytes Relative: 8 %
Neutro Abs: 4 10*3/uL (ref 1.7–7.7)
Neutrophils Relative %: 54 %

## 2023-02-10 LAB — CBC
HCT: 33.2 % — ABNORMAL LOW (ref 36.0–46.0)
Hemoglobin: 10.7 g/dL — ABNORMAL LOW (ref 12.0–15.0)
MCH: 33.1 pg (ref 26.0–34.0)
MCHC: 32.2 g/dL (ref 30.0–36.0)
MCV: 102.8 fL — ABNORMAL HIGH (ref 80.0–100.0)
Platelets: 243 10*3/uL (ref 150–400)
RBC: 3.23 MIL/uL — ABNORMAL LOW (ref 3.87–5.11)
RDW: 12.9 % (ref 11.5–15.5)
WBC: 7.7 10*3/uL (ref 4.0–10.5)
nRBC: 0 % (ref 0.0–0.2)

## 2023-02-10 LAB — APTT: aPTT: 25 seconds (ref 24–36)

## 2023-02-10 LAB — PROTIME-INR
INR: 1.2 (ref 0.8–1.2)
Prothrombin Time: 15.3 seconds — ABNORMAL HIGH (ref 11.4–15.2)

## 2023-02-10 LAB — ETHANOL: Alcohol, Ethyl (B): <10 mg/dL (ref <10)

## 2023-02-10 LAB — CBG MONITORING, ED: Glucose-Capillary: 84 mg/dL (ref 70–99)

## 2023-02-10 MED ORDER — FERROUS SULFATE 325 (65 FE) MG PO TABS
325.0000 mg | ORAL_TABLET | Freq: Every day | ORAL | Status: DC
  Administered 2023-02-11 – 2023-02-13 (×3): 325 mg via ORAL
  Filled 2023-02-10 (×3): qty 1

## 2023-02-10 MED ORDER — ENOXAPARIN SODIUM 40 MG/0.4ML IJ SOSY
40.0000 mg | PREFILLED_SYRINGE | INTRAMUSCULAR | Status: DC
  Administered 2023-02-10 – 2023-02-12 (×3): 40 mg via SUBCUTANEOUS
  Filled 2023-02-10 (×3): qty 0.4

## 2023-02-10 MED ORDER — LORAZEPAM 2 MG/ML IJ SOLN
INTRAMUSCULAR | Status: AC
  Filled 2023-02-10: qty 1

## 2023-02-10 MED ORDER — SODIUM CHLORIDE 0.9 % IV SOLN
2000.0000 mg | Freq: Once | INTRAVENOUS | Status: AC
  Administered 2023-02-10: 2000 mg via INTRAVENOUS
  Filled 2023-02-10: qty 20

## 2023-02-10 MED ORDER — OXYCODONE-ACETAMINOPHEN 5-325 MG PO TABS: 1.0000 | ORAL_TABLET | Freq: Three times a day (TID) | ORAL | Status: DC | PRN

## 2023-02-10 MED ORDER — ANASTROZOLE 1 MG PO TABS
1.0000 mg | ORAL_TABLET | Freq: Every day | ORAL | Status: DC
  Administered 2023-02-11 – 2023-02-13 (×3): 1 mg via ORAL
  Filled 2023-02-10 (×3): qty 1

## 2023-02-10 MED ORDER — ROSUVASTATIN CALCIUM 5 MG PO TABS
10.0000 mg | ORAL_TABLET | Freq: Every day | ORAL | Status: DC
  Administered 2023-02-11 – 2023-02-13 (×3): 10 mg via ORAL
  Filled 2023-02-10 (×3): qty 2

## 2023-02-10 MED ORDER — PROCHLORPERAZINE EDISYLATE 10 MG/2ML IJ SOLN: 5.0000 mg | Freq: Four times a day (QID) | INTRAMUSCULAR | Status: DC | PRN

## 2023-02-10 MED ORDER — LORAZEPAM 2 MG/ML IJ SOLN
2.0000 mg | Freq: Once | INTRAMUSCULAR | Status: AC
  Administered 2023-02-10: 2 mg via INTRAMUSCULAR

## 2023-02-10 MED ORDER — LORAZEPAM 2 MG/ML IJ SOLN
2.0000 mg | INTRAMUSCULAR | Status: AC
  Administered 2023-02-10: 2 mg via INTRAVENOUS

## 2023-02-10 MED ORDER — LEVETIRACETAM IN NACL 1500 MG/100ML IV SOLN: 1500.0000 mg | Freq: Two times a day (BID) | INTRAVENOUS | Status: DC

## 2023-02-10 MED ORDER — DEXAMETHASONE SODIUM PHOSPHATE 10 MG/ML IJ SOLN
10.0000 mg | Freq: Once | INTRAMUSCULAR | Status: AC
  Administered 2023-02-10: 10 mg via INTRAVENOUS
  Filled 2023-02-10: qty 1

## 2023-02-10 MED ORDER — SODIUM CHLORIDE 0.9% FLUSH
3.0000 mL | Freq: Once | INTRAVENOUS | Status: AC
  Administered 2023-02-10: 3 mL via INTRAVENOUS

## 2023-02-10 MED ORDER — LEVETIRACETAM IN NACL 1500 MG/100ML IV SOLN
1500.0000 mg | Freq: Two times a day (BID) | INTRAVENOUS | Status: DC
  Administered 2023-02-11 – 2023-02-13 (×5): 1500 mg via INTRAVENOUS
  Filled 2023-02-10 (×6): qty 100

## 2023-02-10 MED ORDER — LEVOTHYROXINE SODIUM 50 MCG PO TABS
50.0000 ug | ORAL_TABLET | Freq: Every day | ORAL | Status: DC
  Administered 2023-02-11 – 2023-02-13 (×3): 50 ug via ORAL
  Filled 2023-02-10: qty 2
  Filled 2023-02-10 (×2): qty 1

## 2023-02-10 MED ORDER — VITAMIN D 25 MCG (1000 UNIT) PO TABS
1000.0000 [IU] | ORAL_TABLET | Freq: Every day | ORAL | Status: DC
  Administered 2023-02-11 – 2023-02-13 (×3): 1000 [IU] via ORAL
  Filled 2023-02-10 (×3): qty 1

## 2023-02-10 MED ORDER — SODIUM CHLORIDE 0.9 % IV SOLN: 20.0000 mg/kg | Freq: Once | INTRAVENOUS | Status: DC

## 2023-02-10 MED ORDER — ACETAMINOPHEN 325 MG PO TABS: 650.0000 mg | ORAL_TABLET | Freq: Four times a day (QID) | ORAL | Status: DC | PRN

## 2023-02-10 MED ORDER — POTASSIUM CHLORIDE 2 MEQ/ML IV SOLN
INTRAVENOUS | Status: AC
  Filled 2023-02-10 (×2): qty 1000

## 2023-02-10 MED ORDER — VITAMIN B-12 1000 MCG PO TABS
1000.0000 ug | ORAL_TABLET | Freq: Every day | ORAL | Status: DC
  Administered 2023-02-11 – 2023-02-13 (×3): 1000 ug via ORAL
  Filled 2023-02-10 (×3): qty 1

## 2023-02-10 MED ORDER — POLYETHYLENE GLYCOL 3350 17 G PO PACK: 17.0000 g | PACK | Freq: Every day | ORAL | Status: DC | PRN

## 2023-02-10 MED ORDER — LORAZEPAM 2 MG/ML IJ SOLN: 2.0000 mg | INTRAMUSCULAR | Status: DC | PRN

## 2023-02-10 MED ORDER — SODIUM CHLORIDE 0.9 % IV SOLN
20.0000 mg/kg | INTRAVENOUS | Status: AC
  Administered 2023-02-10: 1256 mg via INTRAVENOUS
  Filled 2023-02-10: qty 25.12

## 2023-02-10 NOTE — H&P (Signed)
History and Physical  LAKEYSA FITTING ZOX:[id number] DOB: 09-Apr-1946 DOA: 02/10/2023  Referring physician: Langston Masker, PA-EDP  PCP: [facility]  Outpatient Specialists: Neurology Patient coming from: Home  Chief Complaint: Code stroke, seizures   HPI: Tabitha Ramirez is a 77 y.o. 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  Code stroke was activated by EMS.  The patient was complaining of sudden onset left-sided weakness, last known well at 1730.  She was brought into the ED by EMS.  Shortly before arriving to the hospital, the patient had a focal seizure involving her left arm and left leg.  Seen by stroke team on arrival.  Initially received 2 mg of IM Ativan due to no IV access.  Received an additional 2 mg IV Ativan after IV access was obtained.  A non-contrast CT head was completed which was nonacute.  Not a candidate for TNK due to no stroke suspected.  She was treated with total of 4 mg of Ativan, 2 g of IV Keppra and IV fosphenytoin.  Neurology/stroke team recommended overnight EEG and hospitalist service admission.  Neurology will continue to follow in consultation.  Admitted by [facility] to progressive care unit as inpatient status.  Post ictal at the time of this visit but is arousable to voices and is protecting her airway.  O2 saturation 100% on room air.  ED Course: Temperature 98.1.  BP 126/78, pulse 74, respiration rate 22, O2 saturation 100% on room air.  Lab studies remarkable for serum potassium 3.0, BUN 24, hemoglobin 10.7 at baseline, MCV 102.8.  Review of Systems: Review of systems as noted in the HPI. All other systems reviewed and are negative.   Past Medical History:  Diagnosis Date   Allergy    Anemia    Arthritis    "joints" (09/29/2013)    Breast cancer (HCC)    GERD (gastroesophageal reflux disease)    Heart murmur    History of blood transfusion    "related to chemo/breast cancer" (09/29/2013)   Hypertension    Migraines    Personal history of chemotherapy    Personal history of radiation therapy    Radiation 11/05/13-12/24/13   Right breast    TMJ (temporomandibular joint syndrome)    "left; just dx'd" (09/29/2013   Past Surgical History:  Procedure Laterality Date   AXILLARY LYMPH NODE BIOPSY Right 03/10/2013   Procedure: AXILLARY LYMPH NODE BIOPSY;  Surgeon: Ernestene Mention, MD;  Location: [facility] OR;  Service: General;  Laterality: Right;  sentinel node with blue dye   BREAST BIOPSY     BREAST LUMPECTOMY Right 09/29/2013   needle localization w/axillary LND/notes 09/29/2013   BREAST LUMPECTOMY WITH NEEDLE LOCALIZATION AND AXILLARY LYMPH NODE DISSECTION Right 09/29/2013   Procedure: RIGHT BREAST NEEDLE LOCALIZED LUMPECTOMY AND AXILLARY LYMPH NODE DISSECTION;  Surgeon: Ernestene Mention, MD;  Location: [facility] OR;  Service: General;  Laterality: Right;   COLONOSCOPY N/A 04/30/2013   Procedure: COLONOSCOPY;  Surgeon: Graylin Shiver, MD;  Location: [facility] ENDOSCOPY;  Service: Endoscopy;  Laterality: N/A;   CRANIOTOMY Right 03/11/2022   Procedure: CRANIOTOMY TUMOR EXCISION;  Surgeon: Venetia Night, MD;  Location: [facility] ORS;  Service: Neurosurgery;  Laterality: Right;   ESOPHAGOGASTRODUODENOSCOPY N/A 04/28/2013   Procedure: ESOPHAGOGASTRODUODENOSCOPY (EGD);  Surgeon: Graylin Shiver, MD;  Location: [facility] ENDOSCOPY;  Service: Endoscopy;  Laterality: N/A;   MASTECTOMY Right 09/29/2013   "partial"   PORT-A-CATH REMOVAL  09/29/2013   PORT-A-CATH REMOVAL Left 09/29/2013   Procedure: REMOVAL PORT-A-CATH;  Surgeon: Ernestene Mention, MD;  Location: [facility] OR;  Service: General;  Laterality: Left;   PORTACATH PLACEMENT N/A 03/10/2013   Procedure: INSERTION PORT-A-CATH WITH FLUOROSCOPY AND ULTRASOUND;  Surgeon: Ernestene Mention, MD;  Location: [facility] OR;  Service: General;   Laterality: N/A;   SURAL NERVE BX Right 05/13/2014   Procedure: SURAL NERVE BIOPSY;  Surgeon: Hewitt Shorts, MD;  Location: [facility] NEURO ORS;  Service: Neurosurgery;  Laterality: Right;  Right sural nerve biopsy   TONSILLECTOMY     TOTAL ABDOMINAL HYSTERECTOMY     With bilateral salpingo-oophorectomy    Social History:  reports that she has quit smoking. Her smoking use included cigarettes. She has never used smokeless tobacco. She reports that she does not drink alcohol and does not use drugs.   Allergies  Allergen Reactions   Nsaids Nausea And Vomiting and Other (See Comments)    Pt states stomach ulcers, vomiting, and bleeding.   Penicillins Rash    Other reaction(s): Face swelling    Family History  Problem Relation Age of Onset   Cancer Mother 70       Unknown type of cancer   Congestive Heart Failure Mother    Breast cancer Mother    Colon cancer Brother    Diabetes Brother 30   Heart disease Brother        AMI 05/22/2012   Congestive Heart Failure Brother    Cancer Maternal Grandmother 31       Unknown type of cancer   Breast cancer Maternal Grandmother       Prior to Admission medications   Medication Sig Start Date End Date Taking? Authorizing Provider  acetaminophen (TYLENOL) 325 MG tablet Take 1-2 tablets (325-650 mg total) by mouth every 4 (four) hours as needed for mild pain. 03/27/22  Yes Angiulli, Mcarthur Rossetti, PA-C  anastrozole (ARIMIDEX) 1 MG tablet Take 1 tablet (1 mg total) by mouth daily. 04/04/22  Yes Serena Croissant, MD  Cholecalciferol (D3-1000) 25 MCG (1000 UT) capsule Take 1 capsule (1,000 Units total) by mouth daily. 03/27/22  Yes Angiulli, Mcarthur Rossetti, PA-C  ferrous sulfate 325 (65 FE) MG tablet Take 1 tablet (325 mg total) by mouth daily with breakfast. 03/27/22  Yes Angiulli, Mcarthur Rossetti, PA-C  levETIRAcetam (KEPPRA) 500 MG tablet TAKE 1 TABLET(500 MG) BY MOUTH TWICE DAILY 12/12/22  Yes Van Clines, MD  levothyroxine (SYNTHROID) 50 MCG tablet Take 1 tablet (50  mcg total) by mouth daily. 04/26/22  Yes Jones Bales, NP  oxyCODONE-acetaminophen (PERCOCET/ROXICET) 5-325 MG tablet Take 1 tablet by mouth every 8 (eight) hours as needed for severe pain. 02/07/23  Yes Pickenpack-Cousar, Arty Baumgartner, NP  potassium chloride (KLOR-CON) 10 MEQ tablet Take 10 mEq by mouth daily. 03/10/22  Yes [provider]  rosuvastatin (CRESTOR) 10 MG tablet Take 1 tablet (10 mg total) by mouth daily. 04/26/22  Yes Jones Bales, NP  vitamin B-12 (CYANOCOBALAMIN) 1000 MCG tablet Take 1 tablet (1,000 mcg total) by mouth daily. 03/27/22  Yes Angiulli, Mcarthur Rossetti, PA-C  LORazepam (ATIVAN) 0.5 MG tablet Take 1 tablet (0.5 mg total) by mouth every 8 (eight) hours as needed for anxiety (take 1 tablet as needed for anxiety every 8 hours). 07/24/22   Pickenpack-Cousar, Arty Baumgartner, NP    Physical Exam: BP 126/78 (BP  Location: Right Arm)   Pulse 74   Temp (!) 97.3 F (36.3 C) (Oral)   Resp 20   Ht 5\' 3"  (1.6 m)   Wt 62.8 kg   SpO2 100%   BMI 24.53 kg/m   General: 77 y.o. year-old female well developed well nourished in no acute distress.  Somnolent but arousable to voices. Cardiovascular: Regular rate and rhythm with no rubs or gallops.  No thyromegaly or JVD noted.  No lower extremity edema. 2/4 pulses in all 4 extremities. Respiratory: Clear to auscultation with no wheezes or rales. Good inspiratory effort. Abdomen: Soft nontender nondistended with normal bowel sounds x4 quadrants. Muskuloskeletal: No cyanosis, clubbing or edema noted bilaterally Neuro: CN II-XII intact, strength, sensation, reflexes Skin: No ulcerative lesions noted or rashes Psychiatry: Judgement and insight appear normal. Mood is appropriate for condition and setting          Labs on Admission:  Basic Metabolic Panel: Recent Labs  Lab 02/10/23 1940  NA 139  K 3.0*  CL 102  CO2 23  GLUCOSE 77  BUN 24*  CREATININE 0.93  CALCIUM 9.3   Liver Function Tests: Recent Labs  Lab 02/10/23 1940   AST 25  ALT 24  ALKPHOS 74  BILITOT 0.4  PROT 7.0  ALBUMIN 3.5   No results for input(s): "LIPASE", "AMYLASE" in the last 168 hours. No results for input(s): "AMMONIA" in the last 168 hours. CBC: Recent Labs  Lab 02/10/23 1940  WBC 7.7  NEUTROABS 4.0  HGB 10.7*  HCT 33.2*  MCV 102.8*  PLT 243   Cardiac Enzymes: No results for input(s): "CKTOTAL", "CKMB", "CKMBINDEX", "TROPONINI" in the last 168 hours.  BNP (last 3 results) No results for input(s): "BNP" in the last 8760 hours.  ProBNP (last 3 results) No results for input(s): "PROBNP" in the last 8760 hours.  CBG: Recent Labs  Lab 02/10/23 2112  GLUCAP 84    Radiological Exams on Admission: CT HEAD CODE STROKE WO CONTRAST  Result Date: 02/10/2023 CLINICAL DATA:  Code stroke. Neuro deficit, acute, stroke suspected. EXAM: CT HEAD WITHOUT CONTRAST TECHNIQUE: Contiguous axial images were obtained from the base of the skull through the vertex without intravenous contrast. RADIATION DOSE REDUCTION: This exam was performed according to the departmental dose-optimization program which includes automated exposure control, adjustment of the mA and/or kV according to patient size and/or use of iterative reconstruction technique. COMPARISON:  CT head March 10, 2022.  MRI head May 7 24. FINDINGS: Brain: When comparing across modalities, similar appearance of a right parietal resection site. No evidence of acute large vascular territory infarct, acute hemorrhage, abnormal mass effect, midline shift or hydrocephalus. Vascular: No hyperdense vessel identified. Calcific atherosclerosis. Skull: High right posterior craniotomy.  No acute fracture. Sinuses/Orbits: Clear sinuses.  No acute orbital findings. Other: No mastoid effusions. ASPECTS [facility] Stroke Program Early CT Score) total score (0-10 with 10 being normal): Cough. IMPRESSION: 1. No evidence of acute large vascular territory infarct or acute hemorrhage. ASPECTS is 10. 2. When  comparing across modalities, no substantial change in appearance of a right parietal resection site. This was better evaluated on prior MRI with contrast. Code stroke imaging results were communicated on 02/10/2023 at 6:56 pm to provider Dr. Amada Jupiter via secure text paging. Electronically Signed   By: Feliberto Harts M.D.   On: 02/10/2023 18:57    EKG: I independently viewed the EKG done and my findings are as followed: Sinus rhythm rate of 76.  Nonspecific ST-T changes.  QTc 481.  Assessment/Plan Present on Admission: **None**  Principal Problem:   Seizure (HCC)  New onset seizure activity in the setting of brain metastasis from right breast cancer  Initially presented as a code stroke, had seizure activity on arrival Seen by neurology, appreciate assistance Continue management as recommended by neurology Seizure precautions Overnight EEG as recommended by neurology Neurology will continue to follow IV Ativan as needed for breakthrough seizures  Metastasized right breast cancer Follows with hematology oncology Outpatient management  Hypokalemia Serum potassium 3.0 Repleted intravenously with LR KCl 40 mill equivalent at 75 cc/h x 1 day. Repeat BMP in the morning, obtain magnesium level  Chronic macrocytic anemia Iron deficiency anemia No overt bleeding Resume home iron supplement Hemoglobin 10.1 at baseline Monitor H&H  Hypothyroidism Resume home levothyroxine  Hyperlipidemia Resume home Crestor     DVT prophylaxis: Subcu Lovenox daily.  Code Status: Full code  Family Communication: None at bedside.  Disposition Plan: Admitted to progressive care unit  Consults called: Neurology  Admission status: Inpatient status.   Status is: Inpatient The patient requires at least 2 midnights for further evaluation and treatment of present condition.   Darlin Drop MD [facility] Pager [phone]  If 7PM-7AM, please contact  night-coverage [email or web address] Password [email or web address]  02/10/2023, 11:27 PM

## 2023-02-10 NOTE — ED Provider Notes (Signed)
[place] EMERGENCY DEPARTMENT AT [facility] Provider Note   CSN: [id number] Arrival date & time: 02/10/23  1478     History  Chief Complaint  Patient presents with   Code Stroke    Tabitha Ramirez is a 77 y.o. female.  Pt brought to the ED by EMS for an acute stroke.  Pt has a history of triple negative breast cancer with metastatic brain cancer.  Pt seen by Neurology for code stroke.  Pt noted to be having left arm jerking.    The history is provided by the patient. No language interpreter was used.  Seizures Seizure activity on arrival: yes   Seizure type:  Focal Episode characteristics: abnormal movements   Postictal symptoms: confusion   Progression:  Unchanged PTA treatment:  None History of seizures: yes        Home Medications Prior to Admission medications   Medication Sig Start Date End Date Taking? Authorizing Provider  acetaminophen (TYLENOL) 325 MG tablet Take 1-2 tablets (325-650 mg total) by mouth every 4 (four) hours as needed for mild pain. 03/27/22   Angiulli, Mcarthur Rossetti, PA-C  anastrozole (ARIMIDEX) 1 MG tablet Take 1 tablet (1 mg total) by mouth daily. 04/04/22   Serena Croissant, MD  Cholecalciferol (D3-1000) 25 MCG (1000 UT) capsule Take 1 capsule (1,000 Units total) by mouth daily. 03/27/22   Angiulli, Mcarthur Rossetti, PA-C  ferrous sulfate 325 (65 FE) MG tablet Take 1 tablet (325 mg total) by mouth daily with breakfast. 03/27/22   Angiulli, Mcarthur Rossetti, PA-C  levETIRAcetam (KEPPRA) 500 MG tablet TAKE 1 TABLET(500 MG) BY MOUTH TWICE DAILY 12/12/22   Van Clines, MD  levothyroxine (SYNTHROID) 50 MCG tablet Take 1 tablet (50 mcg total) by mouth daily. 04/26/22   Jones Bales, NP  LORazepam (ATIVAN) 0.5 MG tablet Take 1 tablet (0.5 mg total) by mouth every 8 (eight) hours as needed for anxiety (take 1 tablet as needed for anxiety every 8 hours). 07/24/22   Pickenpack-Cousar, Arty Baumgartner, NP  oxyCODONE-acetaminophen (PERCOCET/ROXICET) 5-325 MG tablet Take 1 tablet  by mouth every 8 (eight) hours as needed for severe pain. 02/07/23   Pickenpack-Cousar, Arty Baumgartner, NP  potassium chloride (KLOR-CON) 10 MEQ tablet Take 10 mEq by mouth daily. 03/10/22   [provider]  rosuvastatin (CRESTOR) 10 MG tablet Take 1 tablet (10 mg total) by mouth daily. 04/26/22   Jones Bales, NP  vitamin B-12 (CYANOCOBALAMIN) 1000 MCG tablet Take 1 tablet (1,000 mcg total) by mouth daily. 03/27/22   Angiulli, Mcarthur Rossetti, PA-C      Allergies    Nsaids and Penicillins    Review of Systems   Review of Systems  Neurological:  Positive for seizures.  All other systems reviewed and are negative.   Physical Exam Updated Vital Signs Wt 62.8 kg   BMI 24.53 kg/m  Physical Exam Vitals reviewed.  HENT:     Right Ear: Tympanic membrane normal.     Left Ear: Tympanic membrane normal.     Nose: Nose normal.     Mouth/Throat:     Mouth: Mucous membranes are moist.  Eyes:     Pupils: Pupils are equal, round, and reactive to light.  Cardiovascular:     Rate and Rhythm: Normal rate.     Pulses: Normal pulses.  Pulmonary:     Effort: Pulmonary effort is normal.  Abdominal:     General: Abdomen is flat.  Musculoskeletal:     Comments: Jerking  left arm.    Skin:    General: Skin is warm.  Neurological:     General: No focal deficit present.     Mental Status: She is alert.  Psychiatric:        Mood and Affect: Mood normal.     ED Results / Procedures / Treatments   Labs (all labs ordered are listed, but only abnormal results are displayed) Labs Reviewed  PROTIME-INR  APTT  CBC  DIFFERENTIAL  COMPREHENSIVE METABOLIC PANEL  ETHANOL  I-STAT CHEM 8, ED  CBG MONITORING, ED    EKG None  Radiology CT HEAD CODE STROKE WO CONTRAST  Result Date: 02/10/2023 CLINICAL DATA:  Code stroke. Neuro deficit, acute, stroke suspected. EXAM: CT HEAD WITHOUT CONTRAST TECHNIQUE: Contiguous axial images were obtained from the base of the skull through the vertex without  intravenous contrast. RADIATION DOSE REDUCTION: This exam was performed according to the departmental dose-optimization program which includes automated exposure control, adjustment of the mA and/or kV according to patient size and/or use of iterative reconstruction technique. COMPARISON:  CT head March 10, 2022.  MRI head May 7 24. FINDINGS: Brain: When comparing across modalities, similar appearance of a right parietal resection site. No evidence of acute large vascular territory infarct, acute hemorrhage, abnormal mass effect, midline shift or hydrocephalus. Vascular: No hyperdense vessel identified. Calcific atherosclerosis. Skull: High right posterior craniotomy.  No acute fracture. Sinuses/Orbits: Clear sinuses.  No acute orbital findings. Other: No mastoid effusions. ASPECTS [facility] Stroke Program Early CT Score) total score (0-10 with 10 being normal): Cough. IMPRESSION: 1. No evidence of acute large vascular territory infarct or acute hemorrhage. ASPECTS is 10. 2. When comparing across modalities, no substantial change in appearance of a right parietal resection site. This was better evaluated on prior MRI with contrast. Code stroke imaging results were communicated on 02/10/2023 at 6:56 pm to provider Dr. Amada Jupiter via secure text paging. Electronically Signed   By: Feliberto Harts M.D.   On: 02/10/2023 18:57    Procedures .Critical Care  Performed by: Elson Areas, PA-C Authorized by: Elson Areas, PA-C   Critical care provider statement:    Critical care time (minutes):  30   Critical care start time:  02/10/2023 7:30 PM   Critical care end time:  02/10/2023 9:48 PM   Critical care time was exclusive of:  Separately billable procedures and treating other patients   Critical care was necessary to treat or prevent imminent or life-threatening deterioration of the following conditions:  Cardiac failure, circulatory failure, CNS failure or compromise and respiratory failure   Critical  care was time spent personally by me on the following activities:  Review of old charts, re-evaluation of patient's condition, pulse oximetry, ordering and review of radiographic studies, ordering and review of laboratory studies, ordering and performing treatments and interventions, development of treatment plan with patient or surrogate, discussions with consultants, evaluation of patient's response to treatment and examination of patient   Care discussed with: admitting provider       Medications Ordered in ED Medications  LORazepam (ATIVAN) 2 MG/ML injection (0 mg  Hold 02/10/23 1919)  levETIRAcetam (KEPPRA) 2,000 mg in sodium chloride 0.9 % 250 mL IVPB (has no administration in time range)  LORazepam (ATIVAN) 2 MG/ML injection (  Not Given 02/10/23 1918)  sodium chloride flush (NS) 0.9 % injection 3 mL (3 mLs Intravenous Given 02/10/23 1916)  fosPHENYtoin (CEREBYX) 1,256 mg PE in sodium chloride 0.9 % 50 mL IVPB (1,256 mg  PE Intravenous New Bag/Given 02/10/23 1916)  LORazepam (ATIVAN) injection 2 mg (2 mg Intravenous Given 02/10/23 1916)  LORazepam (ATIVAN) injection 2 mg (2 mg Intramuscular Given 02/10/23 1859)    ED Course/ Medical Decision Making/ A&P                             Medical Decision Making Pt has breast cancer with brain metastasis.  Pt seizing on arrival    Amount and/or Complexity of Data Reviewed Independent Historian: EMS    Details: History by EMS  External Data Reviewed: notes.    Details: Oncology notes reviewed  Labs: ordered. Decision-making details documented in ED Course.    Details: Labs ordered reviewed an interpreted.  Potassium 3.0  Radiology: ordered and independent interpretation performed. Decision-making details documented in ED Course.    Details: Ct head no acute  Discussion of management or test interpretation with external provider(s): Dr. Amada Jupiter saw pt as a code stroke when she arrived.  He continue to follow until patient's seizure  resolved. Dr. Amada Jupiter advised admission to medicine and neurology will continue to follow. I spoke with Dr. Margo Aye specialist on-call.  Risk Prescription drug management. Decision regarding hospitalization.           Final Clinical Impression(s) / ED Diagnoses Final diagnoses:  Seizure [facility])  Metastatic cancer to brain [facility])    Rx / DC Orders ED Discharge Orders     None         Osie Cheeks 02/10/23 2149    Rondel Baton, MD 02/12/23 1539

## 2023-02-10 NOTE — ED Triage Notes (Signed)
Tabitha Ramirez is a 77 y.o. female pt arrival as code stroke pt has hx of breast CA with mets to brain and brain tumors pt is alert and oriented x 4  actively seizures with left arm tremors and twitching

## 2023-02-10 NOTE — Consult Note (Signed)
Neurology Consultation Reason for Consult: Seizures Referring Physician: Hale Bogus  CC: Seizures  History is obtained from: Patient, daughter  HPI: Tabitha Ramirez is a 77 y.o. 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  She went to the bathroom at 5:30 PM and while she was in the bathroom she began having left-sided weakness.  EMS was called and because there was not initially any seizure-like activity a code stroke was activated, but subsequently it became clear that she was having ongoing convulsive activity.  On arrival to the emergency department, she was awake, alert, but had clear ongoing convulsive activity of the left arm and leg.  IV access was difficult so she was initially given 2 mg of IM Ativan and taken for a stat head CT to ensure no new hemorrhage.  This was negative and IV access was obtained and she was given another 2 mg of IV Ativan and loaded with 2 g of Keppra.   LKW: 5:30 PM tnk given?: no, not a stroke  Past Medical History:  Diagnosis Date   Allergy    Anemia    Arthritis    "joints" (09/29/2013)   Breast cancer (HCC)    GERD (gastroesophageal reflux disease)    Heart murmur    History of blood transfusion    "related to chemo/breast cancer" (09/29/2013)   Hypertension    Migraines    Personal history of chemotherapy    Personal history of radiation therapy    Radiation 11/05/13-12/24/13   Right breast    TMJ (temporomandibular joint syndrome)    "left; just dx'd" (09/29/2013     Family History  Problem Relation Age of Onset   Cancer Mother 62       Unknown type of cancer   Congestive Heart Failure Mother    Breast cancer Mother    Colon cancer Brother    Diabetes Brother 23   Heart disease Brother        AMI 05/22/2012   Congestive Heart Failure Brother    Cancer Maternal Grandmother 18       Unknown type of cancer   Breast cancer Maternal Grandmother       Social History:  reports that she has quit smoking. Her smoking use included cigarettes. She has never used smokeless tobacco. She reports that she does not drink alcohol and does not use drugs.   Exam: Current vital signs: BP 129/86   Pulse 77   Temp 98.1 F (36.7 C)   Resp (!) 22   Ht 5\' 3"  (1.6 m)   Wt 62.8 kg   SpO2 94%   BMI 24.53 kg/m  Vital signs in last 24 hours: Temp:  [98.1 F (36.7 C)] 98.1 F (36.7 C) (05/18 1925) Pulse Rate:  [77] 77 (05/18 1925) Resp:  [22] 22 (05/18 1925) BP: (129)/(86) 129/86 (05/18 1925) SpO2:  [94 %] 94 % (05/18 1925) Weight:  [62.8 kg] 62.8 kg (05/18 1800)   Physical Exam  Appears well-developed and well-nourished.   Neuro: Mental Status: Patient is awake, alert, oriented to person, place,  year, and situation.  She is not able to give me the month. Patient is able to give a clear and coherent history. No signs of aphasia, but possibly with mild neglect Cranial Nerves: II: She has a left hemianopia. Pupils are equal, round, and reactive to light.   III,IV, VI: EOMI without ptosis or diploplia.  V: Facial sensation is absent on the left VII: Facial movement with left facial weakness Motor: She has 4/5 strength of the left arm and leg, and is able to lift her arm and leg against gravity despite active convulsion, 5/5 on the right.   Sensory: Sensation is absent on the left  Cerebellar: No clear ataxia on the right     I have reviewed labs in epic and the results pertinent to this consultation are: CMP is unremarkable  I have reviewed the images obtained: CT head-unchanged right parietal resection, no hemorrhage  Impression: 77 year old female presenting with status epilepticus in the setting of previous tumor resection.  No clear provoking factor, but I suspect that this is a breakthrough seizure in the setting of her previous tumor.  She had significant improvement in her movements with Ativan and Keppra, though she did  get slightly drowsy.  Due to this I did not pursue further Ativan.  She was still having some thumb twitching, so I do think giving an additional antiepileptic with fosphenytoin would be prudent.  I do not think I would pursue this further given her preserved mental status prior to initiating treatment.  Recommendations: 1) Ativan 2 mg IM and 2 mg IV already given 2) Keppra 2 g already given 3) fosphenytoin 20 mg/kg 4) continue Keppra at 1500 twice daily 5) overnight EEG 6) neurology will continue to follow  This patient is critically ill and at significant risk of neurological worsening, death and care requires constant monitoring of vital signs, hemodynamics,respiratory and cardiac monitoring, neurological assessment, discussion with family, other specialists and medical decision making of high complexity. I spent 45 minutes of neurocritical care time  in the care of  this patient. This was time spent independent of any time provided by nurse practitioner or PA.  Ritta Slot, MD [facility] [phone]  If 7pm- 7am, please page neurology on call as listed in AMION. 02/10/2023  7:49 PM

## 2023-02-10 NOTE — Code Documentation (Signed)
Stroke Response Nurse Documentation Code Documentation  Tabitha Ramirez is a 77 y.o. female arriving to [facility]  via [facility] on 02-10-2023 with past medical hx of breast CA with brain met, post resection. On No antithrombotic. Code stroke was activated by EMS.   Patient from home where she was LKW at 1730 and now complaining of left side weakness.  When she went to the bathroom about 1730 she suddenly experience left side weakness.  Per EMS the seizure activity they witnessed started shortly before arriving to the hospital.  Focal seizure left arm and leg  Stroke team at the bedside on patient arrival. Labs drawn and patient cleared for CT by Dr. Eloise Harman. Patient to CT with team. NIHSS 8, see documentation for details and code stroke times. Patient with disoriented, left hemianopia, left facial droop, left arm weakness, left leg weakness, and left decreased sensation on exam. The following imaging was completed:  CT Head. Patient is not a candidate for IV Thrombolytic due to stroke not suspected. Patient is not a candidate for IR due to stroke not suspected.   Treated for seizure: 4mg  Ativan, 2 g Keppra, fosphenytoin   Care Plan: VS and NIHSS q 2 hours.   Bedside handoff with ED RN Marylene Land.    Marcellina Millin  Stroke Response RN

## 2023-02-11 ENCOUNTER — Encounter: Payer: Self-pay | Admitting: Hematology and Oncology

## 2023-02-11 ENCOUNTER — Encounter: Payer: Self-pay | Admitting: Neurology

## 2023-02-11 ENCOUNTER — Encounter: Payer: Self-pay | Admitting: Physical Medicine & Rehabilitation

## 2023-02-11 DIAGNOSIS — R569 Unspecified convulsions: Secondary | ICD-10-CM | POA: Diagnosis not present

## 2023-02-11 DIAGNOSIS — C7931 Secondary malignant neoplasm of brain: Secondary | ICD-10-CM | POA: Diagnosis not present

## 2023-02-11 DIAGNOSIS — C50911 Malignant neoplasm of unspecified site of right female breast: Secondary | ICD-10-CM | POA: Diagnosis not present

## 2023-02-11 LAB — CBC
HCT: 33.9 % — ABNORMAL LOW (ref 36.0–46.0)
Hemoglobin: 11.4 g/dL — ABNORMAL LOW (ref 12.0–15.0)
MCH: 33.3 pg (ref 26.0–34.0)
MCHC: 33.6 g/dL (ref 30.0–36.0)
MCV: 99.1 fL (ref 80.0–100.0)
Platelets: 255 10*3/uL (ref 150–400)
RBC: 3.42 MIL/uL — ABNORMAL LOW (ref 3.87–5.11)
RDW: 12.7 % (ref 11.5–15.5)
WBC: 8.3 10*3/uL (ref 4.0–10.5)
nRBC: 0 % (ref 0.0–0.2)

## 2023-02-11 LAB — MAGNESIUM: Magnesium: 1.7 mg/dL (ref 1.7–2.4)

## 2023-02-11 LAB — BASIC METABOLIC PANEL
Anion gap: 13 (ref 5–15)
BUN: 19 mg/dL (ref 8–23)
CO2: 23 mmol/L (ref 22–32)
Calcium: 9.4 mg/dL (ref 8.9–10.3)
Chloride: 102 mmol/L (ref 98–111)
Creatinine, Ser: 0.89 mg/dL (ref 0.44–1.00)
GFR, Estimated: >60 mL/min (ref >60)
Glucose, Bld: 172 mg/dL — ABNORMAL HIGH (ref 70–99)
Potassium: 3.4 mmol/L — ABNORMAL LOW (ref 3.5–5.1)
Sodium: 138 mmol/L (ref 135–145)

## 2023-02-11 LAB — PHOSPHORUS: Phosphorus: 3.1 mg/dL (ref 2.5–4.6)

## 2023-02-11 MED ORDER — POTASSIUM CHLORIDE 10 MEQ/100ML IV SOLN
10.0000 meq | INTRAVENOUS | Status: AC
  Administered 2023-02-11 (×4): 10 meq via INTRAVENOUS
  Filled 2023-02-11 (×4): qty 100

## 2023-02-11 MED ORDER — ORAL CARE MOUTH RINSE: 15.0000 mL | OROMUCOSAL | Status: DC | PRN

## 2023-02-11 MED ORDER — MAGNESIUM SULFATE 2 GM/50ML IV SOLN
2.0000 g | Freq: Once | INTRAVENOUS | Status: AC
  Administered 2023-02-11: 2 g via INTRAVENOUS
  Filled 2023-02-11: qty 50

## 2023-02-11 NOTE — ED Notes (Signed)
ED TO INPATIENT HANDOFF REPORT  ED Nurse Name and Phone #: Rodney Booze [phone]  S Name/Age/Gender [name] 77 y.o. female Room/Bed: 022C/022C  Code Status   Code Status: Full Code  Home/SNF/Other Home Patient oriented to: self, place, time, and situation Is this baseline? Yes   Triage Complete: Triage complete  Chief Complaint Seizure [facility]) [R56.9]  Triage Note MAKIAYA OFLAHERTY is a 77 y.o. female pt arrival as code stroke pt has hx of breast CA with mets to brain and brain tumors pt is alert and oriented x 4  actively seizures with left arm tremors and twitching    Allergies Allergies  Allergen Reactions   Nsaids Nausea And Vomiting and Other (See Comments)    Pt states stomach ulcers, vomiting, and bleeding.   Penicillins Rash    Other reaction(s): Face swelling    Level of Care/Admitting Diagnosis ED Disposition     ED Disposition  Admit   Condition  --   Comment  Hospital Area: [facility] [[id number]]  Level of Care: Progressive [102]  Admit to Progressive based on following criteria: NEUROLOGICAL AND NEUROSURGICAL complex patients with significant risk of instability, who do not meet ICU criteria, yet require close observation or frequent assessment (< / = every 2 - 4 hours) with medical / nursing intervention.  May admit patient to [facility] or [facility] if equivalent level of care is available:: No  Covid Evaluation: Asymptomatic - no recent exposure (last 10 days) testing not required  Diagnosis: Seizure [facility]) [[id number]]  Admitting Physician: Darlin Drop [[id number]]  Attending Physician: Darlin Drop [[id number]]  Certification:: I certify this patient will need inpatient services for at least 2 midnights  Estimated Length of Stay: 2          B Medical/Surgery History Past Medical History:  Diagnosis Date   Allergy    Anemia    Arthritis    "joints" (09/29/2013)   Breast cancer (HCC)    GERD (gastroesophageal reflux disease)     Heart murmur    History of blood transfusion    "related to chemo/breast cancer" (09/29/2013)   Hypertension    Migraines    Personal history of chemotherapy    Personal history of radiation therapy    Radiation 11/05/13-12/24/13   Right breast    TMJ (temporomandibular joint syndrome)    "left; just dx'd" (09/29/2013   Past Surgical History:  Procedure Laterality Date   AXILLARY LYMPH NODE BIOPSY Right 03/10/2013   Procedure: AXILLARY LYMPH NODE BIOPSY;  Surgeon: Ernestene Mention, MD;  Location: [facility] OR;  Service: General;  Laterality: Right;  sentinel node with blue dye   BREAST BIOPSY     BREAST LUMPECTOMY Right 09/29/2013   needle localization w/axillary LND/notes 09/29/2013   BREAST LUMPECTOMY WITH NEEDLE LOCALIZATION AND AXILLARY LYMPH NODE DISSECTION Right 09/29/2013   Procedure: RIGHT BREAST NEEDLE LOCALIZED LUMPECTOMY AND AXILLARY LYMPH NODE DISSECTION;  Surgeon: Ernestene Mention, MD;  Location: [facility] OR;  Service: General;  Laterality: Right;   COLONOSCOPY N/A 04/30/2013   Procedure: COLONOSCOPY;  Surgeon: Graylin Shiver, MD;  Location: [facility] ENDOSCOPY;  Service: Endoscopy;  Laterality: N/A;   CRANIOTOMY Right 03/11/2022   Procedure: CRANIOTOMY TUMOR EXCISION;  Surgeon: Venetia Night, MD;  Location: [facility] ORS;  Service: Neurosurgery;  Laterality: Right;   ESOPHAGOGASTRODUODENOSCOPY N/A 04/28/2013   Procedure: ESOPHAGOGASTRODUODENOSCOPY (EGD);  Surgeon: Graylin Shiver, MD;  Location: [facility];  Service: Endoscopy;  Laterality: N/A;   MASTECTOMY  Right 09/29/2013   "partial"   PORT-A-CATH REMOVAL  09/29/2013   PORT-A-CATH REMOVAL Left 09/29/2013   Procedure: REMOVAL PORT-A-CATH;  Surgeon: Ernestene Mention, MD;  Location: [facility] OR;  Service: General;  Laterality: Left;   PORTACATH PLACEMENT N/A 03/10/2013   Procedure: INSERTION PORT-A-CATH WITH FLUOROSCOPY AND ULTRASOUND;  Surgeon: Ernestene Mention, MD;  Location: [facility] OR;  Service: General;  Laterality: N/A;   SURAL NERVE BX Right 05/13/2014   Procedure:  SURAL NERVE BIOPSY;  Surgeon: Hewitt Shorts, MD;  Location: [facility] NEURO ORS;  Service: Neurosurgery;  Laterality: Right;  Right sural nerve biopsy   TONSILLECTOMY     TOTAL ABDOMINAL HYSTERECTOMY     With bilateral salpingo-oophorectomy     A IV Location/Drains/Wounds Patient Lines/Drains/Airways Status     Active Line/Drains/Airways     Name Placement date Placement time Site Days   Peripheral IV 02/10/23 20 G Anterior;Left Forearm 02/10/23  1906  Forearm  1            Intake/Output Last 24 hours  Intake/Output Summary (Last 24 hours) at 02/11/2023 1455 Last data filed at 02/11/2023 1425 Gross per 24 hour  Intake 450.13 ml  Output --  Net 450.13 ml    Labs/Imaging Results for orders placed or performed during the hospital encounter of 02/10/23 (from the past 48 hour(s))  Protime-INR     Status: Abnormal   Collection Time: 02/10/23  7:40 PM  Result Value Ref Range   Prothrombin Time 15.3 (H) 11.4 - 15.2 seconds   INR 1.2 0.8 - 1.2    Comment: (NOTE) INR goal varies based on device and disease states. Performed at [facility], [address] [place], [place] [address]   APTT     Status: None   Collection Time: 02/10/23  7:40 PM  Result Value Ref Range   aPTT 25 24 - 36 seconds    Comment: Performed at [facility], [address], [place], [place] [address]  CBC     Status: Abnormal   Collection Time: 02/10/23  7:40 PM  Result Value Ref Range   WBC 7.7 4.0 - 10.5 K/uL   RBC 3.23 (L) 3.87 - 5.11 MIL/uL   Hemoglobin 10.7 (L) 12.0 - 15.0 g/dL   HCT 86.5 (L) 78.4 - 69.6 %   MCV 102.8 (H) 80.0 - 100.0 fL   MCH 33.1 26.0 - 34.0 pg   MCHC 32.2 30.0 - 36.0 g/dL   RDW 29.5 28.4 - 13.2 %   Platelets 243 150 - 400 K/uL   nRBC 0.0 0.0 - 0.2 %    Comment: Performed at [facility], [address], [place], [place] [address]  Differential     Status: None   Collection Time: 02/10/23  7:40 PM  Result Value Ref Range   Neutrophils Relative % 54 %    Neutro Abs 4.0 1.7 - 7.7 K/uL   Lymphocytes Relative 31 %   Lymphs Abs 2.4 0.7 - 4.0 K/uL   Monocytes Relative 8 %   Monocytes Absolute 0.7 0.1 - 1.0 K/uL   Eosinophils Relative 6 %   Eosinophils Absolute 0.5 0.0 - 0.5 K/uL   Basophils Relative 1 %   Basophils Absolute 0.1 0.0 - 0.1 K/uL   Immature Granulocytes 0 %   Abs Immature Granulocytes 0.02 0.00 - 0.07 K/uL    Comment: Performed at [facility], [address], [place], [place] [address]  Comprehensive metabolic panel  Status: Abnormal   Collection Time: 02/10/23  7:40 PM  Result Value Ref Range   Sodium 139 135 - 145 mmol/L   Potassium 3.0 (L) 3.5 - 5.1 mmol/L   Chloride 102 98 - 111 mmol/L   CO2 23 22 - 32 mmol/L   Glucose, Bld 77 70 - 99 mg/dL    Comment: Glucose reference range applies only to samples taken after fasting for at least 8 hours.   BUN 24 (H) 8 - 23 mg/dL   Creatinine, Ser 4.09 0.44 - 1.00 mg/dL   Calcium 9.3 8.9 - 81.1 mg/dL   Total Protein 7.0 6.5 - 8.1 g/dL   Albumin 3.5 3.5 - 5.0 g/dL   AST 25 15 - 41 U/L   ALT 24 0 - 44 U/L   Alkaline Phosphatase 74 38 - 126 U/L   Total Bilirubin 0.4 0.3 - 1.2 mg/dL   GFR, Estimated >91 >47 mL/min    Comment: (NOTE) Calculated using the CKD-EPI Creatinine Equation (2021)    Anion gap 14 5 - 15    Comment: Performed at [facility], [address] [place], [place] [address]  Ethanol     Status: None   Collection Time: 02/10/23  7:40 PM  Result Value Ref Range   Alcohol, Ethyl (B) <10 <10 mg/dL    Comment: (NOTE) Lowest detectable limit for serum alcohol is 10 mg/dL.  For medical purposes only. Performed at [facility], [address] [place], [place] [address]   CBG monitoring, ED     Status: None   Collection Time: 02/10/23  9:12 PM  Result Value Ref Range   Glucose-Capillary 84 70 - 99 mg/dL    Comment: Glucose reference range applies only to samples taken after fasting for at least 8 hours.  CBC     Status: Abnormal    Collection Time: 02/11/23  1:37 AM  Result Value Ref Range   WBC 8.3 4.0 - 10.5 K/uL   RBC 3.42 (L) 3.87 - 5.11 MIL/uL   Hemoglobin 11.4 (L) 12.0 - 15.0 g/dL   HCT 65.7 (L) 84.6 - 96.2 %   MCV 99.1 80.0 - 100.0 fL   MCH 33.3 26.0 - 34.0 pg   MCHC 33.6 30.0 - 36.0 g/dL   RDW 95.2 84.1 - 32.4 %   Platelets 255 150 - 400 K/uL   nRBC 0.0 0.0 - 0.2 %    Comment: Performed at [facility], [address], [place], [place] [address]  Basic metabolic panel     Status: Abnormal   Collection Time: 02/11/23  1:37 AM  Result Value Ref Range   Sodium 138 135 - 145 mmol/L   Potassium 3.4 (L) 3.5 - 5.1 mmol/L   Chloride 102 98 - 111 mmol/L   CO2 23 22 - 32 mmol/L   Glucose, Bld 172 (H) 70 - 99 mg/dL    Comment: Glucose reference range applies only to samples taken after fasting for at least 8 hours.   BUN 19 8 - 23 mg/dL   Creatinine, Ser 7.25 0.44 - 1.00 mg/dL   Calcium 9.4 8.9 - 36.6 mg/dL   GFR, Estimated >44 >03 mL/min    Comment: (NOTE) Calculated using the CKD-EPI Creatinine Equation (2021)    Anion gap 13 5 - 15    Comment: Performed at [facility], [address], [place], [place] [address]  Magnesium     Status: None   Collection Time: 02/11/23  1:37 AM  Result Value Ref Range   Magnesium 1.7 1.7 - 2.4 mg/dL    Comment: Performed at [facility], [address], [place], [place] [address]  Phosphorus     Status: None   Collection Time: 02/11/23  1:37 AM  Result Value Ref Range   Phosphorus 3.1 2.5 - 4.6 mg/dL    Comment: Performed at [facility], [address] [place], [place] [address]   Overnight EEG with video  Result Date: 02/11/2023 [name], MD     02/11/2023  8:58 AM Patient Name: ELLENORE HAMMONS MRN: [id number] Epilepsy Attending: [name] Referring Physician/Provider: Rejeana Brock, MD Duration: 02/10/2023 2345 to 02/11/2023 0850 Patient history:  77 y.o. female with a history of metastatic breast cancer to the brain  status post resection who presents with new onset seizure activity. EEG to evaluate for seizure. Level of alertness: Awake, asleep AEDs during EEG study: LEV, Ativan Technical aspects: This EEG study was done with scalp electrodes positioned according to the 10-20 International system of electrode placement. Electrical activity was reviewed with band pass filter of 1-70Hz , sensitivity of 7 uV/mm, display speed of 22mm/sec with a 60Hz  notched filter applied as appropriate. EEG data were recorded continuously and digitally stored.  Video monitoring was available and reviewed as appropriate. Description: The posterior dominant rhythm consists of 9 Hz activity of moderate voltage (25-35 uV) seen predominantly in posterior head regions, symmetric and reactive to eye opening and eye closing. Sleep was characterized by vertex waves, sleep spindles (12 to 14 Hz), maximal frontocentral region. EEG showed continuous 3 to 6 Hz theta-delta slowing in right centro-parietal region. There is also an excessive amount of 15 to 18 Hz beta activity symmetrically and diffusely. Hyperventilation and photic stimulation were not performed.   ABNORMALITY - Continuous slow, right centro-parietal region. - Excessive beta, generalized IMPRESSION: This study is suggestive of cortical dysfunction arising from right centro-parietal region likely secondary to underlying structural abnormality. The excessive beta activity seen in the background is most likely due to the effect of benzodiazepine and is a benign EEG pattern. No seizures or epileptiform discharges were seen throughout the recording. [name]   CT HEAD CODE STROKE WO CONTRAST  Result Date: 02/10/2023 CLINICAL DATA:  Code stroke. Neuro deficit, acute, stroke suspected. EXAM: CT HEAD WITHOUT CONTRAST TECHNIQUE: Contiguous axial images were obtained from the base of the skull through the vertex without intravenous contrast. RADIATION DOSE REDUCTION: This exam was performed  according to the departmental dose-optimization program which includes automated exposure control, adjustment of the mA and/or kV according to patient size and/or use of iterative reconstruction technique. COMPARISON:  CT head March 10, 2022.  MRI head May 7 24. FINDINGS: Brain: When comparing across modalities, similar appearance of a right parietal resection site. No evidence of acute large vascular territory infarct, acute hemorrhage, abnormal mass effect, midline shift or hydrocephalus. Vascular: No hyperdense vessel identified. Calcific atherosclerosis. Skull: High right posterior craniotomy.  No acute fracture. Sinuses/Orbits: Clear sinuses.  No acute orbital findings. Other: No mastoid effusions. ASPECTS [facility] Stroke Program Early CT Score) total score (0-10 with 10 being normal): Cough. IMPRESSION: 1. No evidence of acute large vascular territory infarct or acute hemorrhage. ASPECTS is 10. 2. When comparing across modalities, no substantial change in appearance of a right parietal resection site. This was better evaluated on prior MRI with contrast. Code stroke imaging results were communicated on 02/10/2023 at 6:56 pm to provider  Dr. Amada Jupiter via secure text paging. Electronically Signed   By: Feliberto Harts M.D.   On: 02/10/2023 18:57    Pending Labs Unresulted Labs (From admission, onward)     Start     Ordered   02/17/23 0500  Creatinine, serum  (enoxaparin (LOVENOX)    CrCl >/= 30 ml/min)  Weekly,   R     Comments: while on enoxaparin therapy    02/10/23 2141            Vitals/Pain Today's Vitals   02/11/23 1345 02/11/23 1400 02/11/23 1415 02/11/23 1417  BP:  116/68    Pulse: 74 79 78   Resp: 20 19 12    Temp:    97.9 F (36.6 C)  TempSrc:    Oral  SpO2: 100% 100% 100%   Weight:      Height:      PainSc:        Isolation Precautions No active isolations  Medications Medications  levETIRAcetam (KEPPRA) IVPB 1500 mg/ 100 mL premix (0 mg Intravenous Stopped  02/11/23 0641)  enoxaparin (LOVENOX) injection 40 mg (40 mg Subcutaneous Given 02/10/23 2232)  lactated ringers 1,000 mL with potassium chloride 40 mEq infusion ( Intravenous New Bag/Given 02/11/23 1424)  LORazepam (ATIVAN) injection 2 mg (has no administration in time range)  acetaminophen (TYLENOL) tablet 650 mg (has no administration in time range)  prochlorperazine (COMPAZINE) injection 5 mg (has no administration in time range)  polyethylene glycol (MIRALAX / GLYCOLAX) packet 17 g (has no administration in time range)  anastrozole (ARIMIDEX) tablet 1 mg (1 mg Oral Given 02/11/23 1012)  ferrous sulfate tablet 325 mg (325 mg Oral Given 02/11/23 0855)  cholecalciferol (VITAMIN D3) 25 MCG (1000 UNIT) tablet 1,000 Units (1,000 Units Oral Given 02/11/23 1009)  levothyroxine (SYNTHROID) tablet 50 mcg (50 mcg Oral Given 02/11/23 0634)  oxyCODONE-acetaminophen (PERCOCET/ROXICET) 5-325 MG per tablet 1 tablet (has no administration in time range)  rosuvastatin (CRESTOR) tablet 10 mg (10 mg Oral Given 02/11/23 1008)  cyanocobalamin (VITAMIN B12) tablet 1,000 mcg (1,000 mcg Oral Given 02/11/23 1008)  potassium chloride 10 mEq in 100 mL IVPB (10 mEq Intravenous New Bag/Given 02/11/23 1437)  sodium chloride flush (NS) 0.9 % injection 3 mL (3 mLs Intravenous Given 02/10/23 1916)  fosPHENYtoin (CEREBYX) 1,256 mg PE in sodium chloride 0.9 % 50 mL IVPB (0 mg PE Intravenous Stopped 02/10/23 1933)  LORazepam (ATIVAN) injection 2 mg ( Intravenous Not Given 02/10/23 1918)  levETIRAcetam (KEPPRA) 2,000 mg in sodium chloride 0.9 % 250 mL IVPB (0 mg Intravenous Stopped 02/10/23 1945)  LORazepam (ATIVAN) injection 2 mg (0 mg Intramuscular Hold 02/10/23 1919)  dexamethasone (DECADRON) injection 10 mg (10 mg Intravenous Given 02/10/23 2108)  magnesium sulfate IVPB 2 g 50 mL (0 g Intravenous Stopped 02/11/23 1132)    Mobility walks with device     Focused Assessments Neuro Assessment Handoff:  Swallow screen pass? Yes   Cardiac Rhythm: Normal sinus rhythm NIH Stroke Scale  Dizziness Present: No Headache Present: No Level of Consciousness (1a.)   : Alert, keenly responsive LOC Questions (1b. )   : Answers both questions correctly LOC Commands (1c. )   : Performs both tasks correctly Best Gaze (2. )  : Normal Visual (3. )  : No visual loss Facial Palsy (4. )    : Minor paralysis Motor Arm, Left (5a. )   : Drift Motor Arm, Right (5b. ) : No drift Motor Leg, Left (6a. )  : Drift Motor  Leg, Right (6b. ) : No drift Limb Ataxia (7. ): Absent Sensory (8. )  : Normal, no sensory loss Best Language (9. )  : No aphasia Dysarthria (10. ): Normal Extinction/Inattention (11.)   : No Abnormality Complete NIHSS TOTAL: 3 Last date known well: 02/10/23 Last time known well: 1730 Neuro Assessment: Within Defined Limits Neuro Checks:      Has TPA been given? No If patient is a Neuro Trauma and patient is going to OR before floor call report to 4N Charge nurse: [phone] or [phone]   R Recommendations: See Admitting Provider Note  Report given to:   Additional Notes:

## 2023-02-11 NOTE — Progress Notes (Addendum)
LTM EEG hooked up and running - no initial skin breakdown - Patient in ER not monitored.

## 2023-02-11 NOTE — Progress Notes (Signed)
Subjective: No further seizures, no further twitching  Exam: Vitals:   02/11/23 1000 02/11/23 1100  BP: (!) 143/75 134/79  Pulse: 75 72  Resp: 20 20  Temp:    SpO2: 100% 99%   Gen: In bed, NAD Resp: non-labored breathing, no acute distress Abd: soft, nt  Neuro: MS: She is somnolent but arouses easily, she is interactive and appropriate once awake CN: Visual fields full, extraocular movements intact, face is symmetric today Motor: She has markedly improved weakness on the left side, almost back to normal in the arm, but poor effort in bilateral lower extremities Sensory: Intact to light touch  Pertinent Labs: Creatinine 0.93  Impression: 77 year old female who presented in status epilepticus which resolved with Ativan and Keppra load.  She did receive a fosphenytoin load as well, but her seizure had pretty much stopped by the time this was started.  I would favor continue her on a higher dose of Keppra as opposed to adding a second agent.  With no further seizures on EEG, we can discontinue this at this time.  I suspect her lethargy is medication related and will continue to improve.  As long as she continues to improve, she can be discharged tomorrow.  Recommendations: 1) Keppra 1500 mg twice daily 2) as long as her mental status improves and she is awake tomorrow, she could be discharged on her increased dose of Keppra. 3) neurology will continue to be available on an as-needed basis, please call with further questions or concerns.  Ritta Slot, MD [facility] [phone]  If 7pm- 7am, please page neurology on call as listed in AMION.

## 2023-02-11 NOTE — Progress Notes (Signed)
PROGRESS NOTE    Tabitha Ramirez  ZOX:[id number] DOB: 08/14/1946 DOA: 02/10/2023 PCP: [facility]   Brief Narrative:  HPI per Dr. Dow Adolph on 02/10/23 HPI: Tabitha Ramirez is a 77 y.o. 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  Code stroke was activated by EMS.  The patient was complaining of sudden onset left-sided weakness, last known well at 1730.  She was brought into the ED by EMS.  Shortly before arriving to the hospital, the patient had a focal seizure involving her left arm and left leg.  Seen by stroke team on arrival.  Initially received 2 mg of IM Ativan due to no IV access.  Received an additional 2 mg IV Ativan after IV access was obtained.  A non-contrast CT head was completed which was nonacute.  Not a candidate for TNK due to no stroke suspected.  She was treated with total of 4 mg of Ativan, 2 g of IV Keppra and IV fosphenytoin.   Neurology/stroke team recommended overnight EEG and hospitalist service admission.  Neurology will continue to follow in consultation.  Admitted by [facility] to progressive care unit as inpatient status.   Post ictal at the time of this visit but is arousable to voices and is protecting her airway.  O2 saturation 100% on room air.   ED Course: Temperature 98.1.  BP 126/78, pulse 74, respiration rate 22, O2 saturation 100% on room air.  Lab studies remarkable for serum potassium 3.0, BUN 24, hemoglobin 10.7 at baseline, MCV 102.8.  **Interim History  Neurology evaluated and she presented with status epilepticus and this was abated with Ativan and Keppra as well as a fosphenytoin load.  Neurology recommends continuing on higher doses of Keppra and have discontinued her LTM EEG monitoring.  Seizures are likely in the setting of  metastatic breast cancer to the brain  Assessment and Plan:  New onset seizure activity in the setting of brain metastasis from right breast cancer  -Initially presented as a code stroke, had seizure activity on arrival -Seen by neurology, appreciate assistance -Continue management as recommended by neurology -Seizure precautions -Overnight EEG as recommended by neurology now stopped -Neurology will continue to follow -LTM EEG done and showed "This study is suggestive of cortical dysfunction arising from right centro-parietal region likely secondary to underlying structural abnormality. The excessive beta activity seen in the background is most likely due to the effect of benzodiazepine and is a benign EEG pattern. No seizures or epileptiform discharges were seen throughout the recording." -IV Ativan as needed for breakthrough seizures -Neurology recommends Keppra 1500 g p.o. twice daily and discharging her on this.  Patient was in status epilepticus when she presented and was given Ativan and Keppra and also received a fosphenytoin load but per neurology her seizure had pretty much abated at the time it was initiated. -She remained lethargic but neurology felt it was related to medications   Metastasized right breast cancer -Follows with hematology oncology -Outpatient management and will continue anastrozole 1 mg p.o. daily   Hypokalemia -Patient's K+ Level Trend: Recent Labs  Lab 02/10/23 1940 02/11/23 0137  K 3.0* 3.4*  -Replete with IV Kcl 40 mEQ and with IVF Maintenance -Continue to Monitor and Replete as Necessary -Repeat CMP in the AM    Chronic Macrocytic Anemia Iron deficiency  anemia -No overt bleeding -Continuing ferrous sulfate 325 mg p.o. twice daily -Resume home iron supplement -Hgb/Hct Trend: Recent Labs  Lab 02/10/23 1940 02/11/23 0137  HGB 10.7* 11.4*  HCT 33.2* 33.9*  MCV 102.8* 99.1  -Check Anemia Panel in the AM -Continue to Monitor for S/Sx of  Bleeding; No overt bleeding noted -Repeat CBC in the AM   Hypothyroidism -Resume home levothyroxine 50 mcg p.o. daily   Hyperlipidemia -Resume home Rosuvastatin 10 mg Daily    DVT prophylaxis: enoxaparin (LOVENOX) injection 40 mg Start: 02/10/23 2200    Code Status: Full Code Family Communication: No Family currently at bedside  Disposition Plan:  Level of care: Progressive Status is: Inpatient Remains inpatient appropriate because: Needs further clinical improvement and clearance by neurology and can likely be discharged in next 24 to 48 hours we will get PT OT to further evaluate and treat for   Consultants:  Neurology  Procedures:  LTM EEG  Antimicrobials:  Anti-infectives (From admission, onward)    None       Subjective: Seen and examined at bedside and she is a little lethargic but doing okay.  Denied complaints.  No nausea or vomiting.  Is very soft-spoken.  Denies any chest pain or shortness of breath.  No other concerns or complaints this time.  Objective: Vitals:   02/11/23 1417 02/11/23 1600 02/11/23 1700 02/11/23 1800  BP:  133/68    Pulse:  79 73 74  Resp:  18 19 20   Temp: 97.9 F (36.6 C)     TempSrc: Oral     SpO2:  100% 100% 100%  Weight:      Height:        Intake/Output Summary (Last 24 hours) at 02/11/2023 1917 Last data filed at 02/11/2023 1800 Gross per 24 hour  Intake 1893.84 ml  Output 100 ml  Net 1793.84 ml   Filed Weights   02/10/23 1800  Weight: 62.8 kg   Examination: Physical Exam:  Constitutional: WN/WD elderly chronically ill-appearing African-American female in no acute distress but is a little somnolent and drowsy Respiratory: Diminished to auscultation bilaterally, no wheezing, rales, rhonchi or crackles. Normal respiratory effort and patient is not tachypenic. No accessory muscle use.  Unlabored breathing Cardiovascular: RRR, no murmurs / rubs / gallops. S1 and S2 auscultated.  No appreciable extremity edema Abdomen:  Soft, non-tender, non-distended. Bowel sounds positive.  GU: Deferred. Musculoskeletal: No clubbing / cyanosis of digits/nails. No joint deformity upper and lower extremities. Skin: No rashes, lesions, ulcers, or skin evaluation. No induration; Warm and dry.  Neurologic: CN 2-12 grossly intact with no focal deficits.  But is a little somnolent and drowsy Psychiatric: Little drowsy but becomes fully awake and alert  Data Reviewed: I have personally reviewed following labs and imaging studies  CBC: Recent Labs  Lab 02/10/23 1940 02/11/23 0137  WBC 7.7 8.3  NEUTROABS 4.0  --   HGB 10.7* 11.4*  HCT 33.2* 33.9*  MCV 102.8* 99.1  PLT 243 255   Basic Metabolic Panel: Recent Labs  Lab 02/10/23 1940 02/11/23 0137  NA 139 138  K 3.0* 3.4*  CL 102 102  CO2 23 23  GLUCOSE 77 172*  BUN 24* 19  CREATININE 0.93 0.89  CALCIUM 9.3 9.4  MG  --  1.7  PHOS  --  3.1   GFR: Estimated Creatinine Clearance: 45.2 mL/min (by C-G formula based on SCr of 0.89 mg/dL). Liver Function Tests: Recent Labs  Lab 02/10/23 1940  AST 25  ALT 24  ALKPHOS 74  BILITOT 0.4  PROT 7.0  ALBUMIN 3.5   No results for input(s): "LIPASE", "AMYLASE" in the last 168 hours. No results for input(s): "AMMONIA" in the last 168 hours. Coagulation Profile: Recent Labs  Lab 02/10/23 1940  INR 1.2   Cardiac Enzymes: No results for input(s): "CKTOTAL", "CKMB", "CKMBINDEX", "TROPONINI" in the last 168 hours. BNP (last 3 results) No results for input(s): "PROBNP" in the last 8760 hours. HbA1C: No results for input(s): "HGBA1C" in the last 72 hours. CBG: Recent Labs  Lab 02/10/23 2112  GLUCAP 84   Lipid Profile: No results for input(s): "CHOL", "HDL", "LDLCALC", "TRIG", "CHOLHDL", "LDLDIRECT" in the last 72 hours. Thyroid Function Tests: No results for input(s): "TSH", "T4TOTAL", "FREET4", "T3FREE", "THYROIDAB" in the last 72 hours. Anemia Panel: No results for input(s): "VITAMINB12", "FOLATE",  "FERRITIN", "TIBC", "IRON", "RETICCTPCT" in the last 72 hours. Sepsis Labs: No results for input(s): "PROCALCITON", "LATICACIDVEN" in the last 168 hours.  No results found for this or any previous visit (from the past 240 hour(s)).   Radiology Studies: Overnight EEG with video  Result Date: 02/11/2023 [name], MD     02/11/2023  4:58 PM Patient Name: BENISHA CASTETTER MRN: [id number] Epilepsy Attending: [name] Referring Physician/Provider: Rejeana Brock, MD Duration: 02/10/2023 2345 to 02/11/2023 1449 Patient history:  77 y.o. 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 EEG to evaluate for seizure. Level of alertness: Awake, asleep AEDs during EEG study: LEV, Ativan Technical aspects: This EEG study was done with scalp electrodes positioned according to the 10-20 International system of electrode placement. Electrical activity was reviewed with band pass filter of 1-70Hz , sensitivity of 7 uV/mm, display speed of 29mm/sec with a 60Hz  notched filter applied as appropriate. EEG data were recorded continuously and digitally stored.  Video monitoring was available and reviewed as appropriate. Description: The posterior dominant rhythm consists of 9 Hz activity of moderate voltage (25-35 uV) seen predominantly in posterior head regions, symmetric and reactive to eye opening and eye closing. Sleep was characterized by vertex waves, sleep spindles (12 to 14 Hz), maximal frontocentral region. EEG showed continuous 3 to 6 Hz theta-delta slowing in right centro-parietal region. There is also an excessive amount of 15 to 18 Hz beta activity symmetrically and diffusely. Hyperventilation and photic stimulation were not performed.   ABNORMALITY - Continuous slow, right centro-parietal region. - Excessive beta, generalized IMPRESSION: This study is suggestive of cortical dysfunction arising from right centro-parietal  region likely secondary to underlying structural abnormality. The excessive beta activity seen in the background is most likely due to the effect of benzodiazepine and is a benign EEG pattern. No seizures or epileptiform discharges were seen throughout the recording. [name]   CT HEAD CODE STROKE WO CONTRAST  Result Date: 02/10/2023 CLINICAL DATA:  Code stroke. Neuro deficit, acute, stroke suspected. EXAM: CT HEAD WITHOUT CONTRAST TECHNIQUE: Contiguous axial images were obtained from the base of the skull through the vertex without intravenous contrast. RADIATION DOSE REDUCTION: This exam was performed according to the departmental dose-optimization program which includes automated exposure control, adjustment of the mA and/or kV according to patient size and/or use of iterative reconstruction technique. COMPARISON:  CT head March 10, 2022.  MRI head May 7 24. FINDINGS: Brain: When comparing across modalities, similar appearance of a right parietal resection site. No evidence of acute large vascular territory infarct, acute  hemorrhage, abnormal mass effect, midline shift or hydrocephalus. Vascular: No hyperdense vessel identified. Calcific atherosclerosis. Skull: High right posterior craniotomy.  No acute fracture. Sinuses/Orbits: Clear sinuses.  No acute orbital findings. Other: No mastoid effusions. ASPECTS [facility] Stroke Program Early CT Score) total score (0-10 with 10 being normal): Cough. IMPRESSION: 1. No evidence of acute large vascular territory infarct or acute hemorrhage. ASPECTS is 10. 2. When comparing across modalities, no substantial change in appearance of a right parietal resection site. This was better evaluated on prior MRI with contrast. Code stroke imaging results were communicated on 02/10/2023 at 6:56 pm to provider Dr. Amada Jupiter via secure text paging. Electronically Signed   By: Feliberto Harts M.D.   On: 02/10/2023 18:57    Scheduled Meds:  anastrozole  1 mg Oral Daily    cholecalciferol  1,000 Units Oral Daily   cyanocobalamin  1,000 mcg Oral Daily   enoxaparin (LOVENOX) injection  40 mg Subcutaneous Q24H   ferrous sulfate  325 mg Oral Q breakfast   levothyroxine  50 mcg Oral Q0600   rosuvastatin  10 mg Oral Daily   Continuous Infusions:  lactated ringers 1,000 mL with potassium chloride 40 mEq infusion 75 mL/hr at 02/11/23 1800   levETIRAcetam Stopped (02/11/23 1752)    LOS: 1 day   Marguerita Merles, DO [facility] Available via Epic secure chat 7am-7pm After these hours, please refer to coverage provider listed on [email or web address] 02/11/2023, 7:17 PM

## 2023-02-11 NOTE — ED Notes (Signed)
EEG in place per EEG tech.

## 2023-02-11 NOTE — Procedures (Addendum)
Patient Name: Tabitha Ramirez  MRN: [id number]  Epilepsy Attending: [name]  Referring Physician/Provider: Rejeana Brock, MD  Duration: 02/10/2023 2345 to 02/11/2023 1449  Patient history:  77 y.o. 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 EEG to evaluate for seizure.   Level of alertness: Awake, asleep  AEDs during EEG study: LEV, Ativan  Technical aspects: This EEG study was done with scalp electrodes positioned according to the 10-20 International system of electrode placement. Electrical activity was reviewed with band pass filter of 1-70Hz , sensitivity of 7 uV/mm, display speed of 80mm/sec with a 60Hz  notched filter applied as appropriate. EEG data were recorded continuously and digitally stored.  Video monitoring was available and reviewed as appropriate.  Description: The posterior dominant rhythm consists of 9 Hz activity of moderate voltage (25-35 uV) seen predominantly in posterior head regions, symmetric and reactive to eye opening and eye closing. Sleep was characterized by vertex waves, sleep spindles (12 to 14 Hz), maximal frontocentral region. EEG showed continuous 3 to 6 Hz theta-delta slowing in right centro-parietal region. There is also an excessive amount of 15 to 18 Hz beta activity symmetrically and diffusely. Hyperventilation and photic stimulation were not performed.     ABNORMALITY - Continuous slow, right centro-parietal region.  - Excessive beta, generalized  IMPRESSION: This study is suggestive of cortical dysfunction arising from right centro-parietal region likely secondary to underlying structural abnormality. The excessive beta activity seen in the background is most likely due to the effect of benzodiazepine and is a benign EEG pattern.  No seizures or epileptiform discharges were seen throughout the recording.  Tabitha Ramirez

## 2023-02-12 ENCOUNTER — Inpatient Hospital Stay (HOSPITAL_COMMUNITY): Payer: Medicare PPO

## 2023-02-12 ENCOUNTER — Inpatient Hospital Stay: Payer: Medicare PPO | Admitting: Nurse Practitioner

## 2023-02-12 DIAGNOSIS — R569 Unspecified convulsions: Secondary | ICD-10-CM | POA: Diagnosis not present

## 2023-02-12 LAB — COMPREHENSIVE METABOLIC PANEL
ALT: 24 U/L (ref 0–44)
AST: 25 U/L (ref 15–41)
Albumin: 3.3 g/dL — ABNORMAL LOW (ref 3.5–5.0)
Alkaline Phosphatase: 70 U/L (ref 38–126)
Anion gap: 8 (ref 5–15)
BUN: 16 mg/dL (ref 8–23)
CO2: 26 mmol/L (ref 22–32)
Calcium: 9 mg/dL (ref 8.9–10.3)
Chloride: 101 mmol/L (ref 98–111)
Creatinine, Ser: 0.9 mg/dL (ref 0.44–1.00)
GFR, Estimated: >60 mL/min (ref >60)
Glucose, Bld: 128 mg/dL — ABNORMAL HIGH (ref 70–99)
Potassium: 3.8 mmol/L (ref 3.5–5.1)
Sodium: 135 mmol/L (ref 135–145)
Total Bilirubin: 0.7 mg/dL (ref 0.3–1.2)
Total Protein: 7 g/dL (ref 6.5–8.1)

## 2023-02-12 LAB — CBC WITH DIFFERENTIAL/PLATELET
Abs Immature Granulocytes: 0.03 10*3/uL (ref 0.00–0.07)
Basophils Absolute: 0 10*3/uL (ref 0.0–0.1)
Basophils Relative: 0 %
Eosinophils Absolute: 0.1 10*3/uL (ref 0.0–0.5)
Eosinophils Relative: 1 %
HCT: 32.4 % — ABNORMAL LOW (ref 36.0–46.0)
Hemoglobin: 10.7 g/dL — ABNORMAL LOW (ref 12.0–15.0)
Immature Granulocytes: 0 %
Lymphocytes Relative: 22 %
Lymphs Abs: 2.5 10*3/uL (ref 0.7–4.0)
MCH: 33 pg (ref 26.0–34.0)
MCHC: 33 g/dL (ref 30.0–36.0)
MCV: 100 fL (ref 80.0–100.0)
Monocytes Absolute: 0.8 10*3/uL (ref 0.1–1.0)
Monocytes Relative: 7 %
Neutro Abs: 8.3 10*3/uL — ABNORMAL HIGH (ref 1.7–7.7)
Neutrophils Relative %: 70 %
Platelets: 258 10*3/uL (ref 150–400)
RBC: 3.24 MIL/uL — ABNORMAL LOW (ref 3.87–5.11)
RDW: 13 % (ref 11.5–15.5)
WBC: 11.7 10*3/uL — ABNORMAL HIGH (ref 4.0–10.5)
nRBC: 0 % (ref 0.0–0.2)

## 2023-02-12 LAB — RETICULOCYTES
Immature Retic Fract: 12.1 % (ref 2.3–15.9)
RBC.: 3.26 MIL/uL — ABNORMAL LOW (ref 3.87–5.11)
Retic Count, Absolute: 71.1 10*3/uL (ref 19.0–186.0)
Retic Ct Pct: 2.2 % (ref 0.4–3.1)

## 2023-02-12 LAB — IRON AND TIBC
Iron: 111 ug/dL (ref 28–170)
Saturation Ratios: 39 % — ABNORMAL HIGH (ref 10.4–31.8)
TIBC: 281 ug/dL (ref 250–450)
UIBC: 170 ug/dL

## 2023-02-12 LAB — MAGNESIUM: Magnesium: 2.1 mg/dL (ref 1.7–2.4)

## 2023-02-12 LAB — PHOSPHORUS: Phosphorus: 2.9 mg/dL (ref 2.5–4.6)

## 2023-02-12 LAB — VITAMIN B12: Vitamin B-12: 2635 pg/mL — ABNORMAL HIGH (ref 180–914)

## 2023-02-12 LAB — FOLATE: Folate: 15.8 ng/mL (ref >5.9)

## 2023-02-12 LAB — FERRITIN: Ferritin: 767 ng/mL — ABNORMAL HIGH (ref 11–307)

## 2023-02-12 MED ORDER — GADOBUTROL 1 MMOL/ML IV SOLN
6.0000 mL | Freq: Once | INTRAVENOUS | Status: AC | PRN
  Administered 2023-02-12: 6 mL via INTRAVENOUS

## 2023-02-12 NOTE — Evaluation (Signed)
Physical Therapy Evaluation Patient Details Name: Tabitha Ramirez MRN: [id number] DOB: September 19, 1946 Today's Date: 02/12/2023  History of Present Illness  Tabitha Ramirez is a 77 y.o. female who presented to [facility] ED from home as a code stroke.due to sudden onset of L sided weakness. She then had a vocal seizure involving L UE and LE. Overnight EEG suggestive of cortical dysfunction arising from R centro-parietal region due to met brain lesion. 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 Pt with noted delayed response time, L inattn, L sided weakness, decreased safety awareness, impaired STM, and decreased insight to deficits. Pt requiring min/modA for safe mobility at this time and use of RW due to L sided weakness. Pt to benefit from HHPT and continued 24/7 assist from daughter for safe d/c home once medically stable. Acute PT to cont to follow.       Recommendations for follow up therapy are one component of a multi-disciplinary discharge planning process, led by the attending physician.  Recommendations may be updated based on patient status, additional functional criteria and insurance authorization.  Follow Up Recommendations       Assistance Recommended at Discharge Frequent or constant Supervision/Assistance  Patient can return home with the following  A little help with walking and/or transfers;A little help with bathing/dressing/bathroom;Assistance with cooking/housework;Direct supervision/assist for medications management;Assist for transportation;Help with stairs or ramp for entrance    Equipment Recommendations None recommended by PT (pt with all recommended DME)  Recommendations for Other Services       Functional Status Assessment Patient has had a recent decline in their functional status and demonstrates the ability  to make significant improvements in function in a reasonable and predictable amount of time.     Precautions / Restrictions Precautions Precautions: Fall Precaution Comments: L inattn, weakness Restrictions Weight Bearing Restrictions: No      Mobility  Bed Mobility Overal bed mobility: Needs Assistance Bed Mobility: Supine to Sit     Supine to sit: Min assist     General bed mobility comments: HOB elevated, used bed rail to pull up, directional verbal cues, minA to scoot to EOB    Transfers Overall transfer level: Needs assistance Equipment used: Rolling walker (2 wheels) Transfers: Sit to/from Stand Sit to Stand: Min assist           General transfer comment: minA to power up, verbal cues for safe hand placement, posterior bias    Ambulation/Gait Ambulation/Gait assistance: Mod assist Gait Distance (Feet): 60 Feet Assistive device: Rolling walker (2 wheels) Gait Pattern/deviations: Step-to pattern, Decreased stride length, Decreased stance time - right, Decreased step length - left, Decreased dorsiflexion - left, Narrow base of support, Trunk flexed Gait velocity: dec Gait velocity interpretation: <1.31 ft/sec, indicative of household ambulator   General Gait Details: pt initially dragging L foot, with verbal cues pt able to pick up left foot however not as high as R, modA for safe walker management due to pt pushing to far in front, pt asked to turn L several times however noted attn requiring tactile cues, pt then stating "oh the left, haha"  Stairs            Wheelchair Mobility    Modified Rankin (Stroke Patients Only) Modified Rankin (Stroke Patients Only) Pre-Morbid Rankin Score: Moderate disability Modified Rankin: Moderately severe disability     Balance Overall balance  assessment: Needs assistance Sitting-balance support: Feet supported, No upper extremity supported Sitting balance-Leahy Scale: Fair     Standing balance support: Bilateral  upper extremity supported Standing balance-Leahy Scale: Poor Standing balance comment: reliant on external support, pt with R lateral bias                             Pertinent Vitals/Pain Pain Assessment Pain Assessment: No/denies pain    Home Living Family/patient expects to be discharged to:: Private residence Living Arrangements: Children (dtr, son-in-law, 8yo grandson) Available Help at Discharge: Family;Available 24 hours/day Type of Home: House Home Access: Stairs to enter Entrance Stairs-Rails: Left Entrance Stairs-Number of Steps: 2   Home Layout: One level Home Equipment: Agricultural consultant (2 wheels);Cane - single point;Shower seat;BSC/3in1      Prior Function Prior Level of Function : Needs assist             Mobility Comments: inconsistent report of PLOF but reports using cane and RW, shower seat and 3n1 over commode ADLs Comments: pt reports "I can dress and bath myself" however suspect dtr supervises. Doesn't drive     Hand Dominance   Dominant Hand: Right    Extremity/Trunk Assessment   Upper Extremity Assessment Upper Extremity Assessment: LUE deficits/detail LUE Deficits / Details: noted weakness compared to R, bilat neuropathy in the hands    Lower Extremity Assessment Lower Extremity Assessment: LLE deficits/detail LLE Deficits / Details: grossly 4-/5, difficulty clearing L foot during amb, bilat foot neuropathy    Cervical / Trunk Assessment Cervical / Trunk Assessment: Normal;Other exceptions (R masectomy)  Communication   Communication: No difficulties  Cognition Arousal/Alertness: Awake/alert Behavior During Therapy: Flat affect Overall Cognitive Status: No family/caregiver present to determine baseline cognitive functioning                                 General Comments: pt with delayed response time, A&Ox4 with exception of month, stated June, noted L inattention but asked if they decided if she had a stroke  or seizure. Pt with inconsistent report of PLOF, unsure of baseline        General Comments General comments (skin integrity, edema, etc.): VSS    Exercises     Assessment/Plan    PT Assessment Patient needs continued PT services  PT Problem List Decreased strength;Decreased activity tolerance;Decreased balance;Decreased mobility;Decreased cognition;Decreased coordination;Decreased knowledge of use of DME;Decreased safety awareness       PT Treatment Interventions DME instruction;Gait training;Stair training;Therapeutic activities;Functional mobility training;Therapeutic exercise;Balance training;Neuromuscular re-education;Cognitive remediation;Patient/family education    PT Goals (Current goals can be found in the Care Plan section)  Acute Rehab PT Goals Patient Stated Goal: didn't state PT Goal Formulation: With patient Time For Goal Achievement: 02/26/23 Potential to Achieve Goals: Good    Frequency Min 3X/week     Co-evaluation               AM-PAC PT "6 Clicks" Mobility  Outcome Measure Help needed turning from your back to your side while in a flat bed without using bedrails?: A Little Help needed moving from lying on your back to sitting on the side of a flat bed without using bedrails?: A Little Help needed moving to and from a bed to a chair (including a wheelchair)?: A Little Help needed standing up from a chair using your arms (e.g., wheelchair or bedside chair)?: A Lot  Help needed to walk in hospital room?: A Lot Help needed climbing 3-5 steps with a railing? : A Lot 6 Click Score: 15    End of Session Equipment Utilized During Treatment: Gait belt Activity Tolerance: Patient tolerated treatment well Patient left: in chair;with chair alarm set;with call bell/phone within reach Nurse Communication: Mobility status PT Visit Diagnosis: Unsteadiness on feet (R26.81);Muscle weakness (generalized) (M62.81);Difficulty in walking, not elsewhere classified  (R26.2)    Time: 1610-9604 PT Time Calculation (min) (ACUTE ONLY): 30 min   Charges:   PT Evaluation $PT Eval Moderate Complexity: 1 Mod PT Treatments $Gait Training: 8-22 mins        Lewis Shock, PT, DPT Acute Rehabilitation Services Secure chat preferred Office #: [phone]   Iona Hansen 02/12/2023, 9:16 AM

## 2023-02-12 NOTE — Evaluation (Signed)
Occupational Therapy Evaluation Patient Details Name: Tabitha Ramirez MRN: [id number] DOB: 1945-11-30 Today's Date: 02/12/2023   History of Present Illness Tabitha Ramirez is a 77 y.o. female who presented to [facility] ED from home as a code stroke.due to sudden onset of L sided weakness. She then had a focal seizure involving L UE and LE. Overnight EEG suggestive of cortical dysfunction arising from R centro-parietal region due to met brain lesion. 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 Pt did not drive, but she managed her own finances and medication. Pt demonstrates a significant functional decline due to deficits listed below and will need direct physical assistance with ADL, mobility and IADL tasks. Discussed with daughter over the phone and she can provide necessary level of assistance for DC home (Gait belt left in room) Recommend follow up with OT at  the neuro outpt center.  Acute OT to follow.      Recommendations for follow up therapy are one component of a multi-disciplinary discharge planning process, led by the attending physician.  Recommendations may be updated based on patient status, additional functional criteria and insurance authorization.   Assistance Recommended at Discharge Frequent or constant Supervision/Assistance  Patient can return home with the following A little help with walking and/or transfers;A little help with bathing/dressing/bathroom;Assistance with cooking/housework;Direct supervision/assist for medications management;Direct supervision/assist for financial management;Assist for transportation;Help with stairs or ramp for entrance    Functional Status Assessment  Patient has had a recent decline in their functional  status and demonstrates the ability to make significant improvements in function in a reasonable and predictable amount of time.  Equipment Recommendations  None recommended by OT    Recommendations for Other Services       Precautions / Restrictions Precautions Precautions: Fall Precaution Comments: L inattn, weakness Restrictions Weight Bearing Restrictions: No      Mobility Bed Mobility Overal bed mobility: Needs Assistance Bed Mobility: Supine to Sit     Supine to sit: Supervision          Transfers Overall transfer level: Needs assistance Equipment used: Rolling walker (2 wheels) Transfers: Sit to/from Stand Sit to Stand: Min assist           General transfer comment: minA to power up, verbal cues for safe hand placement, posterior bias      Balance Overall balance assessment: Needs assistance Sitting-balance support: Feet supported, No upper extremity supported Sitting balance-Leahy Scale: Fair     Standing balance support: Bilateral upper extremity supported Standing balance-Leahy Scale: Poor Standing balance comment: reliant on external support, pt with R lateral bias                           ADL either performed or assessed with clinical judgement   ADL Overall ADL's : Needs assistance/impaired Eating/Feeding: Set up   Grooming: Minimal assistance;Standing   Upper Body Bathing: Minimal assistance   Lower Body Bathing: Minimal assistance   Upper Body Dressing : Minimal assistance   Lower Body Dressing: Minimal assistance   Toilet Transfer: Minimal assistance;Ambulation;Rolling walker (2 wheels)   Toileting- Clothing Manipulation and Hygiene: Minimal assistance       Functional mobility during ADLs: Moderate assistance;Cueing for safety;Cueing for sequencing;Rolling walker (2 wheels) (  mod A to prevent fall toward L)       Vision Baseline Vision/History: 1 Wears glasses Patient Visual Report: No change from  baseline Additional Comments: decreased visual attention     Perception Perception Perception Tested?:  (L inattention noted) Comments: bumping into counter on L; unaware L hand tangled in lines   Praxis      Pertinent Vitals/Pain Pain Assessment Pain Assessment: Faces Pain Score: 2  Pain Location: "neuropathy in handsand feet" Pain Intervention(s):  (pain in R breast at times)     Hand Dominance Right   Extremity/Trunk Assessment Upper Extremity Assessment Upper Extremity Assessment: LUE deficits/detail LUE Deficits / Details: L incoordiantion; motor sensory deficits noted; difficulty with fine motor tasks however able to complete with increased time   Lower Extremity Assessment Lower Extremity Assessment: Defer to PT evaluation LLE Deficits / Details: grossly 4-/5, difficulty clearing L foot during amb, bilat foot neuropathy   Cervical / Trunk Assessment Cervical / Trunk Assessment: Normal;Other exceptions (R masectomy)   Communication Communication Communication: No difficulties   Cognition Arousal/Alertness: Awake/alert Behavior During Therapy: Flat affect; impulsive Overall Cognitive Status: Impaired/Different from baseline Area of Impairment: Attention, Memory (poor non-declarative), Following commands, Safety/judgement, Awareness                   Current Attention Level: Selective Memory: Decreased short-term memory Following Commands: Follows one step commands with increased time Safety/Judgement: Decreased awareness of safety, Decreased awareness of deficits Awareness: Emergent   General Comments: Did not remember PT working with her earlier; unable to remember task to brush her teeth; would benefit from spaced retrival     General Comments  VSS    Exercises     Shoulder Instructions      Home Living Family/patient expects to be discharged to:: Private residence Living Arrangements: Children (dtr, son-in-law, 8yo grandson) Available Help at  Discharge: Family;Available 24 hours/day Type of Home: House Home Access: Stairs to enter [employer] of Steps: 2 Entrance Stairs-Rails: Left Home Layout: One level     Bathroom Shower/Tub: Chief Strategy Officer: Standard Bathroom Accessibility: Yes How Accessible: Accessible via walker Home Equipment: Rolling Walker (2 wheels);Cane - single point;Shower seat;BSC/3in1;Grab bars - tub/shower;Hand held shower head          Prior Functioning/Environment Prior Level of Function : Needs assist             Mobility Comments: inconsistent report of PLOF but reports using cane and RW, shower seat and 3n1 over commode ADLs Comments: pt continues ot cook and complete her finances and medication management; does not drive        OT Problem List: Decreased strength;Impaired balance (sitting and/or standing);Decreased activity tolerance;Decreased range of motion;Impaired vision/perception;Decreased coordination;Decreased cognition;Decreased safety awareness;Decreased knowledge of use of DME or AE;Impaired UE functional use;Pain      OT Treatment/Interventions: Self-care/ADL training;Therapeutic exercise;Neuromuscular education;Energy conservation;DME and/or AE instruction;Therapeutic activities;Cognitive remediation/compensation;Visual/perceptual remediation/compensation;Patient/family education;Balance training    OT Goals(Current goals can be found in the care plan section) Acute Rehab OT Goals Patient Stated Goal: to go home OT Goal Formulation: With patient/family Time For Goal Achievement: 02/26/23 Potential to Achieve Goals: Good  OT Frequency: Min 2X/week    Co-evaluation              AM-PAC OT "6 Clicks" Daily Activity     Outcome Measure Help from another person eating meals?: A Little Help from another person taking care of personal grooming?: A Little Help from another person  toileting, which includes using toliet, bedpan, or urinal?: A  Little Help from another person bathing (including washing, rinsing, drying)?: A Little Help from another person to put on and taking off regular upper body clothing?: A Little Help from another person to put on and taking off regular lower body clothing?: A Little 6 Click Score: 18   End of Session Equipment Utilized During Treatment: Gait belt;Rolling walker (2 wheels) Nurse Communication: Mobility status;Other (comment) (DC recommendations)  Activity Tolerance: Patient tolerated treatment well Patient left: in bed;with call bell/phone within reach;with bed alarm set  OT Visit Diagnosis: Unsteadiness on feet (R26.81);Other abnormalities of gait and mobility (R26.89);Muscle weakness (generalized) (M62.81);Other symptoms and signs involving cognitive function;Other symptoms and signs involving the nervous system (R29.898);Pain Pain - Right/Left: Right Pain - part of body:  (breast; B hands.feet)                Time: 1100-1126 OT Time Calculation (min): 26 min Charges:  OT General Charges $OT Visit: 1 Visit OT Evaluation $OT Eval Moderate Complexity: 1 Mod OT Treatments $Self Care/Home Management : 8-22 mins  Luisa Dago, OT/L   Acute OT Clinical Specialist Acute Rehabilitation Services Pager [phone] Office [phone]   [facility] 02/12/2023, 11:44 AM

## 2023-02-12 NOTE — Progress Notes (Signed)
PROGRESS NOTE    Tabitha Ramirez  ZOX:[id number] DOB: 03/07/1946 DOA: 02/10/2023 PCP: [facility]   Brief Narrative:   Tabitha Ramirez is a 77 y.o. 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  Neurology consulted, found to be in status epilepticus, resolved with Ativan and Keppra, she did receive forceful phenytoin load as well  Neurology evaluated and she presented with status epilepticus and this was abated with Ativan and Keppra as well as a fosphenytoin load.  Neurology recommends continuing on higher doses of Keppra and have discontinued her LTM EEG monitoring.  Seizures are likely in the setting of metastatic breast cancer to the brain  Assessment and Plan:  New onset seizure activity in the setting of brain metastasis from right breast cancer  Acute metabolic encephalopathy -Patient significantly altered in the setting of postictal status and IV Ativan, this has much improved she is currently awake alert and appropriate -Neurology input much appreciated, recommendation to increase Keppra to 1500 mg oral p.o. twice daily. -Patient reports she still feels left side weaker than baseline, so we will proceed with MRI brain with and without contrast.  History of metastasized right breast cancer -Follows with hematology oncology/radiation oncology -Outpatient management and will continue anastrozole 1 mg p.o. daily   Hypokalemia -Repleted   Chronic Macrocytic Anemia Iron deficiency anemia -Ferritin, folate and B12 level are not low.   Hypothyroidism -Resume home levothyroxine 50 mcg p.o. daily   Hyperlipidemia -Resume home Rosuvastatin 10 mg Daily    DVT prophylaxis: enoxaparin (LOVENOX) injection 40 mg Start: 02/10/23 2200    Code Status: Full  Code Family Communication: No Family currently at bedside  Disposition Plan:  Level of care: Progressive Status is: Inpatient    Consultants:  Neurology  Procedures:  LTM EEG  Antimicrobials:  Anti-infectives (From admission, onward)    None       Subjective: No nausea, no vomiting, she denies any complaints today, reports some residual left-sided weakness, reports is usually worse than baseline. Objective: Vitals:   02/12/23 0700 02/12/23 0800 02/12/23 0900 02/12/23 1000  BP:  (!) 140/74    Pulse: 66 72 69 80  Resp: 18 14 15  (!) 22  Temp:      TempSrc:      SpO2: 100% 100% 100% 100%  Weight:      Height:        Intake/Output Summary (Last 24 hours) at 02/12/2023 1220 Last data filed at 02/12/2023 1025 Gross per 24 hour  Intake 1643.84 ml  Output 900 ml  Net 743.84 ml   Filed Weights   02/10/23 1800  Weight: 62.8 kg   Examination:  Awake Alert, Oriented X 3,(she thinks it is June instead of May) she is much more awake, appropriate and coherent today, Symmetrical Chest wall movement, Good air movement bilaterally, CTAB RRR,No Gallops,Rubs or new Murmurs, No Parasternal Heave +ve B.Sounds, Abd Soft, No tenderness, No rebound - guarding or rigidity. No Cyanosis, Clubbing or edema, No new Rash or bruise     Data Reviewed: I have personally reviewed following labs and imaging studies  CBC: Recent Labs  Lab 02/10/23 1940 02/11/23 0137 02/12/23 0453  WBC 7.7 8.3 11.7*  NEUTROABS 4.0  --  8.3*  HGB 10.7* 11.4* 10.7*  HCT 33.2* 33.9* 32.4*  MCV 102.8* 99.1 100.0  PLT 243 255 258   Basic Metabolic Panel: Recent Labs  Lab 02/10/23 1940 02/11/23 0137 02/12/23 0453  NA 139 138 135  K 3.0* 3.4* 3.8  CL 102 102 101  CO2 23 23 26   GLUCOSE 77 172* 128*  BUN 24* 19 16  CREATININE 0.93 0.89 0.90  CALCIUM 9.3 9.4 9.0  MG  --  1.7 2.1  PHOS  --  3.1 2.9   GFR: Estimated Creatinine Clearance: 44.7 mL/min (by C-G formula based on SCr of 0.9  mg/dL). Liver Function Tests: Recent Labs  Lab 02/10/23 1940 02/12/23 0453  AST 25 25  ALT 24 24  ALKPHOS 74 70  BILITOT 0.4 0.7  PROT 7.0 7.0  ALBUMIN 3.5 3.3*   No results for input(s): "LIPASE", "AMYLASE" in the last 168 hours. No results for input(s): "AMMONIA" in the last 168 hours. Coagulation Profile: Recent Labs  Lab 02/10/23 1940  INR 1.2   Cardiac Enzymes: No results for input(s): "CKTOTAL", "CKMB", "CKMBINDEX", "TROPONINI" in the last 168 hours. BNP (last 3 results) No results for input(s): "PROBNP" in the last 8760 hours. HbA1C: No results for input(s): "HGBA1C" in the last 72 hours. CBG: Recent Labs  Lab 02/10/23 2112  GLUCAP 84   Lipid Profile: No results for input(s): "CHOL", "HDL", "LDLCALC", "TRIG", "CHOLHDL", "LDLDIRECT" in the last 72 hours. Thyroid Function Tests: No results for input(s): "TSH", "T4TOTAL", "FREET4", "T3FREE", "THYROIDAB" in the last 72 hours. Anemia Panel: Recent Labs    02/12/23 0453  VITAMINB12 2,635*  FOLATE 15.8  FERRITIN 767*  TIBC 281  IRON 111  RETICCTPCT 2.2   Sepsis Labs: No results for input(s): "PROCALCITON", "LATICACIDVEN" in the last 168 hours.  No results found for this or any previous visit (from the past 240 hour(s)).   Radiology Studies: Overnight EEG with video  Result Date: 02/11/2023 [name], MD     02/11/2023  4:58 PM Patient Name: ANALUISA BURKER MRN: [id number] Epilepsy Attending: [name] Referring Physician/Provider: Rejeana Brock, MD Duration: 02/10/2023 2345 to 02/11/2023 1449 Patient history:  77 y.o. 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 EEG to evaluate for seizure. Level of alertness: Awake, asleep AEDs during EEG study: LEV, Ativan Technical aspects: This EEG study was done with scalp electrodes positioned according to the 10-20 International system of electrode placement.  Electrical activity was reviewed with band pass filter of 1-70Hz , sensitivity of 7 uV/mm, display speed of 74mm/sec with a 60Hz  notched filter applied as appropriate. EEG data were recorded continuously and digitally stored.  Video monitoring was available and reviewed as appropriate. Description: The posterior dominant rhythm consists of 9 Hz activity of moderate voltage (25-35 uV) seen predominantly in posterior head regions, symmetric and reactive to eye opening and eye closing. Sleep was characterized by vertex waves, sleep spindles (12 to 14 Hz), maximal frontocentral region. EEG showed continuous 3 to 6 Hz theta-delta slowing in right centro-parietal region. There is also an excessive amount of 15 to 18 Hz beta activity symmetrically and diffusely. Hyperventilation and photic stimulation were not performed.   ABNORMALITY - Continuous slow, right centro-parietal region. - Excessive beta, generalized IMPRESSION: This study is suggestive of cortical dysfunction arising from right centro-parietal region likely secondary to underlying structural abnormality. The excessive beta activity seen in the background is most likely due to the effect of benzodiazepine and is a benign EEG pattern. No seizures or epileptiform discharges  were seen throughout the recording. [name]   CT HEAD CODE STROKE WO CONTRAST  Result Date: 02/10/2023 CLINICAL DATA:  Code stroke. Neuro deficit, acute, stroke suspected. EXAM: CT HEAD WITHOUT CONTRAST TECHNIQUE: Contiguous axial images were obtained from the base of the skull through the vertex without intravenous contrast. RADIATION DOSE REDUCTION: This exam was performed according to the departmental dose-optimization program which includes automated exposure control, adjustment of the mA and/or kV according to patient size and/or use of iterative reconstruction technique. COMPARISON:  CT head March 10, 2022.  MRI head May 7 24. FINDINGS: Brain: When comparing across modalities,  similar appearance of a right parietal resection site. No evidence of acute large vascular territory infarct, acute hemorrhage, abnormal mass effect, midline shift or hydrocephalus. Vascular: No hyperdense vessel identified. Calcific atherosclerosis. Skull: High right posterior craniotomy.  No acute fracture. Sinuses/Orbits: Clear sinuses.  No acute orbital findings. Other: No mastoid effusions. ASPECTS [facility] Stroke Program Early CT Score) total score (0-10 with 10 being normal): Cough. IMPRESSION: 1. No evidence of acute large vascular territory infarct or acute hemorrhage. ASPECTS is 10. 2. When comparing across modalities, no substantial change in appearance of a right parietal resection site. This was better evaluated on prior MRI with contrast. Code stroke imaging results were communicated on 02/10/2023 at 6:56 pm to provider Dr. Amada Jupiter via secure text paging. Electronically Signed   By: Feliberto Harts M.D.   On: 02/10/2023 18:57    Scheduled Meds:  anastrozole  1 mg Oral Daily   cholecalciferol  1,000 Units Oral Daily   cyanocobalamin  1,000 mcg Oral Daily   enoxaparin (LOVENOX) injection  40 mg Subcutaneous Q24H   ferrous sulfate  325 mg Oral Q breakfast   levothyroxine  50 mcg Oral Q0600   rosuvastatin  10 mg Oral Daily   Continuous Infusions:  levETIRAcetam 1,500 mg (02/12/23 0558)    LOS: 2 days   Huey Bienenstock MD [facility] Available via Epic secure chat 7am-7pm After these hours, please refer to coverage provider listed on [email or web address] 02/12/2023, 12:20 PM

## 2023-02-13 ENCOUNTER — Other Ambulatory Visit (HOSPITAL_COMMUNITY): Payer: Self-pay

## 2023-02-13 DIAGNOSIS — R569 Unspecified convulsions: Secondary | ICD-10-CM | POA: Diagnosis not present

## 2023-02-13 LAB — GLUCOSE, CAPILLARY: Glucose-Capillary: 76 mg/dL (ref 70–99)

## 2023-02-13 MED ORDER — ACETAMINOPHEN 325 MG PO TABS: 325.0000 mg | ORAL_TABLET | Freq: Four times a day (QID) | ORAL | Status: DC | PRN

## 2023-02-13 MED ORDER — LEVETIRACETAM 500 MG PO TABS
1500.0000 mg | ORAL_TABLET | Freq: Two times a day (BID) | ORAL | 0 refills | Status: DC
  Filled 2023-02-13: qty 180, 30d supply, fill #0

## 2023-02-13 NOTE — Care Management Important Message (Signed)
Important Message  Patient Details  Name: Tabitha Ramirez MRN: [id number] Date of Birth: [birth year]   Medicare Important Message Given:  Ronnette Juniper  Patient left prior to IM delivery will mail to the patient home address    Dorena Bodo 02/13/2023, 4:27 PM

## 2023-02-13 NOTE — Progress Notes (Signed)
Occupational Therapy Treatment Patient Details Name: Tabitha Ramirez MRN: [id number] DOB: [birth year] Today's Date: 02/13/2023   History of present illness Tabitha Ramirez is a 77 y.o. female who presented to [facility] ED from home as a code stroke.due to sudden onset of L sided weakness. She then had a focal seizure involving L UE and LE. Overnight EEG suggestive of cortical dysfunction arising from R centro-parietal region due to met brain lesion. 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 Requires overall min A with ADL and functional mobility due to deficits below. Will need direct physical assistance initially with ADL and ambulation. Daughter prefers follow up at the neuro outpt center. Will continue to follow.    Recommendations for follow up therapy are one component of a multi-disciplinary discharge planning process, led by the attending physician.  Recommendations may be updated based on patient status, additional functional criteria and insurance authorization.    Assistance Recommended at Discharge Frequent or constant Supervision/Assistance  Patient can return home with the following  A little help with walking and/or transfers;A little help with bathing/dressing/bathroom;Assistance with cooking/housework;Direct supervision/assist for medications management;Direct supervision/assist for financial management;Assist for transportation;Help with stairs or ramp for entrance   Equipment Recommendations  None recommended by OT    Recommendations for Other Services      Precautions / Restrictions Precautions Precautions: Fall Precaution Comments: L inattn, weakness Restrictions Weight Bearing Restrictions: No       Mobility Bed Mobility Overal bed mobility: Needs Assistance Bed Mobility: Supine to Sit     Supine to sit:  Supervision     General bed mobility comments: HOB elevated, used bed rail to pull up, directional verbal cues, minA to scoot to EOB    Transfers Overall transfer level: Needs assistance Equipment used: Rolling walker (2 wheels) Transfers: Sit to/from Stand Sit to Stand: Min assist           General transfer comment: minA to power up, verbal cues for safe hand placement, posterior bias     Balance Overall balance assessment: Needs assistance Sitting-balance support: Feet supported, No upper extremity supported Sitting balance-Leahy Scale: Fair     Standing balance support: Bilateral upper extremity supported Standing balance-Leahy Scale: Poor Standing balance comment: reliant on external support, pt with R lateral bias                           ADL either performed or assessed with clinical judgement   ADL Overall ADL's : Needs assistance/impaired Eating/Feeding: Set up   Grooming: Minimal assistance;Standing   Upper Body Bathing: Minimal assistance   Lower Body Bathing: Minimal assistance   Upper Body Dressing : Minimal assistance   Lower Body Dressing: Minimal assistance   Toilet Transfer: Minimal assistance;Ambulation;Rolling walker (2 wheels)   Toileting- Clothing Manipulation and Hygiene: Minimal assistance       Functional mobility during ADLs: Cueing for safety;Cueing for sequencing;Rolling walker (2 wheels);Minimal assistance (mod A to prevent fall toward L)      Extremity/Trunk Assessment Upper Extremity Assessment LUE Deficits / Details: L incoordiantion; motor sensory deficits noted; difficulty with fine motor tasks however able to complete with increased time   Lower Extremity Assessment LLE Deficits / Details: grossly 4-/5, difficulty clearing L foot during amb, bilat foot neuropathy        Vision  Perception Perception Perception: Impaired (L inattention)   Praxis Praxis Praxis:  (L sensory motor impairment)     Cognition Arousal/Alertness: Awake/alert Behavior During Therapy: Flat affect Overall Cognitive Status: Impaired/Different from baseline Area of Impairment: Attention, Memory, Following commands, Safety/judgement, Awareness                   Current Attention Level: Selective Memory: Decreased short-term memory Following Commands: Follows one step commands with increased time Safety/Judgement: Decreased awareness of safety, Decreased awareness of deficits Awareness: Emergent   General Comments: Difficulty coordinating movement in space; cues ot walk inside of walker and not beside it        Exercises      Shoulder Instructions       General Comments VSS    Pertinent Vitals/ Pain       Pain Assessment Pain Assessment: Faces Pain Location: "neuropathy in handsand feet"  Home Living                                          Prior Functioning/Environment              Frequency  Min 2X/week        Progress Toward Goals  OT Goals(current goals can now be found in the care plan section)  Progress towards OT goals: Progressing toward goals  Acute Rehab OT Goals Patient Stated Goal: to go home today OT Goal Formulation: With patient/family Time For Goal Achievement: 02/26/23 Potential to Achieve Goals: Good ADL Goals Pt Will Perform Lower Body Bathing: with supervision;sit to/from stand Pt Will Transfer to Toilet: with supervision;ambulating Pt/caregiver will Perform Home Exercise Program: Left upper extremity;With theraputty;With Supervision Additional ADL Goal #1: Pt/family will verbalize 3 strategies to reduce risk of falls  Plan Discharge plan remains appropriate    Co-evaluation                 AM-PAC OT "6 Clicks" Daily Activity     Outcome Measure   Help from another person eating meals?: A Little Help from another person taking care of personal grooming?: A Little Help from another person toileting, which  includes using toliet, bedpan, or urinal?: A Little Help from another person bathing (including washing, rinsing, drying)?: A Little Help from another person to put on and taking off regular upper body clothing?: A Little Help from another person to put on and taking off regular lower body clothing?: A Little 6 Click Score: 18    End of Session Equipment Utilized During Treatment: Gait belt;Rolling walker (2 wheels)  OT Visit Diagnosis: Unsteadiness on feet (R26.81);Other abnormalities of gait and mobility (R26.89);Muscle weakness (generalized) (M62.81);Other symptoms and signs involving cognitive function;Other symptoms and signs involving the nervous system (R29.898);Pain Pain - Right/Left: Right Pain - part of body:  (breast; B hands.feet)   Activity Tolerance Patient tolerated treatment well   Patient Left with call bell/phone within reach;in chair;with chair alarm set   Nurse Communication Mobility status;Other (comment) (DC recommendations)        Time: 2130-8657 OT Time Calculation (min): 25 min  Charges: OT General Charges $OT Visit: 1 Visit OT Treatments $Self Care/Home Management : 23-37 mins  Luisa Dago, OT/L   Acute OT Clinical Specialist Acute Rehabilitation Services Pager [phone] Office [phone]   [facility] 02/13/2023, 1:41 PM

## 2023-02-13 NOTE — TOC Transition Note (Signed)
Transition of Care [facility]) - CM/SW Discharge Note   Patient Details  Name: Tabitha Ramirez MRN: [id number] Date of Birth: [birth year]  Transition of Care [facility]) CM/SW Contact:  Gordy Clement, RN Phone Number: 02/13/2023, 9:35 AM   Clinical Narrative:   Patient will DC to home today  Home Health PT and OT has been arranged with [facility] (Choice and rating) . AVS has been updated. Family to transport  No additional TOC needs             Patient Goals and CMS Choice      Discharge Placement                         Discharge Plan and Services Additional resources added to the After Visit Summary for                                       Social Determinants of Health (SDOH) Interventions SDOH Screenings   Depression (PHQ2-9): Low Risk  (12/27/2022)  Tobacco Use: Medium Risk (12/27/2022)     Readmission Risk Interventions     No data to display

## 2023-02-13 NOTE — Plan of Care (Signed)

## 2023-02-13 NOTE — Discharge Summary (Signed)
Physician Discharge Summary  Tabitha Ramirez ZOX:[id number] DOB: 1946/07/26 DOA: 02/10/2023  PCP: [facility]  Admit date: 02/10/2023 Discharge date: 02/13/2023  Admitted From: (Home) Disposition:  (Home)  Recommendations for Outpatient Follow-up:  Follow up with PCP in 1-2 weeks Please obtain BMP/CBC in one week Continue with seizure precautions, patient was updated about instructions about seizure precautions  Home Health: (YES)  Brief/Interim Summary:  Tabitha Ramirez is a 77 y.o. 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  Neurology consulted, found to be in status epilepticus, resolved with Ativan and Keppra, she did receive forceful phenytoin load as well  Neurology evaluated and she presented with status epilepticus and this was abated with Ativan and Keppra as well as a fosphenytoin load.  Neurology recommends continuing on higher doses of Keppra and have discontinued her LTM EEG monitoring.  Seizures are likely in the setting of metastatic breast cancer to the brain   seizure activity in the setting of brain metastasis from right breast cancer  Acute metabolic encephalopathy -Patient significantly altered in the setting of postictal status and IV Ativan on presentation , this has much improved she is currently back to her baseline.-Neurology input much appreciated, recommendation to increase Keppra to 1500 mg oral p.o. twice daily.  Was provided by new meds from [facility] at time of discharge -MRI brain with no evidence of acute CVA or acute findings. No new contrast-enhancing intracranial lesions visualized.  Only significant postsurgical changes from a right parietal craniotomy with unchanged appearance of the resection cavity.     History of metastasized right  breast cancer -Follows with hematology oncology/radiation oncology -Outpatient management and will continue anastrozole 1 mg p.o. daily   Hypokalemia -Repleted   Chronic Macrocytic Anemia Iron deficiency anemia -Ferritin, folate and B12 level are not low.   Hypothyroidism -Resume home levothyroxine 50 mcg p.o. daily   Hyperlipidemia -Resume home Rosuvastatin 10 mg Daily    Discharge Diagnoses:  Principal Problem:   Seizure (HCC) Active Problems:   Essential hypertension, benign   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, right [facility])    Discharge Instructions  Discharge Instructions     Diet - low sodium heart healthy   Complete by: As directed    Increase activity slowly   Complete by: As directed       Allergies as of 02/13/2023       Reactions   Nsaids Nausea And Vomiting, Other (See Comments)   Pt states stomach ulcers, vomiting, and bleeding.   Penicillins Rash   Other reaction(s): Face swelling        Medication List     TAKE these medications    acetaminophen 325 MG tablet Commonly known as: TYLENOL Take 1-2 tablets (325-650 mg total) by mouth every 6 (six) hours as needed for mild pain. What changed: when to take this   anastrozole 1 MG tablet Commonly known as: ARIMIDEX Take 1 tablet (1 mg total) by mouth daily.   cyanocobalamin 1000 MCG tablet Commonly known as: VITAMIN B12 Take 1 tablet (1,000 mcg total) by mouth daily.   D3-1000 25 MCG (1000 UT) capsule Generic drug: Cholecalciferol Take 1 capsule (1,000 Units total) by mouth daily.   ferrous sulfate 325 (65 FE) MG tablet Take 1 tablet (325 mg total) by mouth daily with breakfast.  levETIRAcetam 500 MG tablet Commonly known as: KEPPRA Take 3 tablets (1,500 mg total) by mouth 2 (two) times daily. What changed:  how much to take how to take this when to take this additional instructions   levothyroxine 50 MCG tablet Commonly known as: SYNTHROID Take 1 tablet (50 mcg  total) by mouth daily.   LORazepam 0.5 MG tablet Commonly known as: ATIVAN Take 1 tablet (0.5 mg total) by mouth every 8 (eight) hours as needed for anxiety (take 1 tablet as needed for anxiety every 8 hours).   oxyCODONE-acetaminophen 5-325 MG tablet Commonly known as: PERCOCET/ROXICET Take 1 tablet by mouth every 8 (eight) hours as needed for severe pain.   potassium chloride 10 MEQ tablet Commonly known as: KLOR-CON Take 10 mEq by mouth daily.   rosuvastatin 10 MG tablet Commonly known as: CRESTOR Take 1 tablet (10 mg total) by mouth daily.        Follow-up Information     [facility], [facility] Follow up.   Specialty: Home Health Services Why: [facility] will provide Home Health PT and OT and will contact you within 48 hours of discharging  to arrange initial home visit Contact information: [address] [place] [address] [phone]                Allergies  Allergen Reactions   Nsaids Nausea And Vomiting and Other (See Comments)    Pt states stomach ulcers, vomiting, and bleeding.   Penicillins Rash    Other reaction(s): Face swelling    Consultations: Neurology   Procedures/Studies: MR BRAIN W WO CONTRAST  Result Date: 02/12/2023 CLINICAL DATA:  Seizure disorder, clinical change seizure, EXAM: MRI HEAD WITHOUT AND WITH CONTRAST TECHNIQUE: Multiplanar, multiecho pulse sequences of the brain and surrounding structures were obtained without and with intravenous contrast. CONTRAST:  6mL GADAVIST GADOBUTROL 1 MMOL/ML IV SOLN COMPARISON:  CT head 02/10/23, MR Head 01/30/23 FINDINGS: Brain: Postsurgical changes from a right parietal craniotomy with a subjacent resection cavity redemonstrated. There is a small amount of hyperintense signal on diffusion-weighted imaging of the floor of the resection cavity, favored to represent a small amount of postsurgical blood products. Negative for an acute infarct. No hydrocephalus. No extra-axial fluid  collection. There is a small amount of smooth dural contrast enhancement at the craniotomy site, favored to be postsurgical. T2/FLAIR hyperintense signal abnormality surrounding the resection site is unchanged. No new contrast-enhancing parenchymal lesion is visualized. Previously reported contrast-enhancing lesion in the left frontal lobe is not visualized on this exam there is sequela of moderate overall chronic microvascular ischemic change. Vascular: Normal flow voids. Skull and upper cervical spine: Right parietal craniotomy. Sinuses/Orbits: No middle ear or mastoid effusion. Pansinus mucosal thickening. Orbits are unremarkable. Other: None. IMPRESSION: 1. No acute abnormality. No new contrast-enhancing intracranial lesions visualized. 2. Postsurgical changes from a right parietal craniotomy with unchanged appearance of the resection cavity. Electronically Signed   By: Lorenza Cambridge M.D.   On: 02/12/2023 14:43   Overnight EEG with video  Result Date: 02/11/2023 [name], MD     02/11/2023  4:58 PM Patient Name: Tabitha Ramirez MRN: [id number] Epilepsy Attending: [name] Referring Physician/Provider: Rejeana Brock, MD Duration: 02/10/2023 2345 to 02/11/2023 1449 Patient history:  77 y.o. 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 EEG to evaluate for seizure. Level of alertness: Awake, asleep AEDs during EEG study: LEV, Ativan  Technical aspects: This EEG study was done with scalp electrodes positioned according to the 10-20 International system of electrode placement. Electrical activity was reviewed with band pass filter of 1-70Hz , sensitivity of 7 uV/mm, display speed of 91mm/sec with a 60Hz  notched filter applied as appropriate. EEG data were recorded continuously and digitally stored.  Video monitoring was available and reviewed as appropriate. Description: The posterior dominant rhythm consists of  9 Hz activity of moderate voltage (25-35 uV) seen predominantly in posterior head regions, symmetric and reactive to eye opening and eye closing. Sleep was characterized by vertex waves, sleep spindles (12 to 14 Hz), maximal frontocentral region. EEG showed continuous 3 to 6 Hz theta-delta slowing in right centro-parietal region. There is also an excessive amount of 15 to 18 Hz beta activity symmetrically and diffusely. Hyperventilation and photic stimulation were not performed.   ABNORMALITY - Continuous slow, right centro-parietal region. - Excessive beta, generalized IMPRESSION: This study is suggestive of cortical dysfunction arising from right centro-parietal region likely secondary to underlying structural abnormality. The excessive beta activity seen in the background is most likely due to the effect of benzodiazepine and is a benign EEG pattern. No seizures or epileptiform discharges were seen throughout the recording. [name]   CT HEAD CODE STROKE WO CONTRAST  Result Date: 02/10/2023 CLINICAL DATA:  Code stroke. Neuro deficit, acute, stroke suspected. EXAM: CT HEAD WITHOUT CONTRAST TECHNIQUE: Contiguous axial images were obtained from the base of the skull through the vertex without intravenous contrast. RADIATION DOSE REDUCTION: This exam was performed according to the departmental dose-optimization program which includes automated exposure control, adjustment of the mA and/or kV according to patient size and/or use of iterative reconstruction technique. COMPARISON:  CT head March 10, 2022.  MRI head May 7 24. FINDINGS: Brain: When comparing across modalities, similar appearance of a right parietal resection site. No evidence of acute large vascular territory infarct, acute hemorrhage, abnormal mass effect, midline shift or hydrocephalus. Vascular: No hyperdense vessel identified. Calcific atherosclerosis. Skull: High right posterior craniotomy.  No acute fracture. Sinuses/Orbits: Clear  sinuses.  No acute orbital findings. Other: No mastoid effusions. ASPECTS [facility] Stroke Program Early CT Score) total score (0-10 with 10 being normal): Cough. IMPRESSION: 1. No evidence of acute large vascular territory infarct or acute hemorrhage. ASPECTS is 10. 2. When comparing across modalities, no substantial change in appearance of a right parietal resection site. This was better evaluated on prior MRI with contrast. Code stroke imaging results were communicated on 02/10/2023 at 6:56 pm to provider Dr. Amada Jupiter via secure text paging. Electronically Signed   By: Feliberto Harts M.D.   On: 02/10/2023 18:57   MR Brain W Wo Contrast  Result Date: 02/02/2023 CLINICAL DATA:  Metastatic disease. EXAM: MRI HEAD WITHOUT AND WITH CONTRAST TECHNIQUE: Multiplanar, multiecho pulse sequences of the brain and surrounding structures were obtained without and with intravenous contrast. CONTRAST:  6 cc Vueway COMPARISON:  Brain MRI most recently 11/02/2022. FINDINGS: Brain: Postsurgical changes reflecting right parietal craniotomy for mass resection are again seen. Dural thickening and enhancement underlying the craniotomy is similar to the prior studies. The cystic resection cavity measures 2 2.3 cm x 2.0 cm, not significantly changed. Smooth enhancement at the periphery of the cavity is similar to the prior study, measuring up to 2-3 mm in thickness antero inferiorly (16-14). FLAIR signal abnormality surrounding the cavity is unchanged. There is no new or progressive diffusion restriction. Postsurgical blood products in the cavity are unchanged. There is no new  or progressive mass effect. The punctate focus of enhancement in the left frontal cortex is unchanged without surrounding edema or mass effect (13-89). There are no new lesions. There is no acute intracranial hemorrhage, extra-axial fluid collection, or acute infarct. Parenchymal volume is stable. The ventricles are stable in size. Patchy and confluent  FLAIR signal abnormality elsewhere in the supratentorial white matter and brainstem likely reflecting sequela of chronic small-vessel ischemic change is stable. The pituitary and suprasellar region are normal. There is no midline shift. Vascular: Normal flow voids. Skull and upper cervical spine: There is no suspicious marrow signal abnormality. Sinuses/Orbits: The paranasal sinuses are clear. The globes and orbits are unremarkable. Other: None. IMPRESSION: Stable exam with no new or progressive enhancement at the right parietal resection site, and unchanged punctate cortical lesion in the left frontal lobe. No new lesions. Electronically Signed   By: Lesia Hausen M.D.   On: 02/02/2023 10:12      Subjective:  No significant events overnight, she denies any complaints today. Discharge Exam: Vitals:   02/13/23 0853 02/13/23 1200  BP: (!) 115/58   Pulse: 61   Resp: 15   Temp: 98.3 F (36.8 C) 98.2 F (36.8 C)  SpO2:  100%   Vitals:   02/12/23 2343 02/13/23 0447 02/13/23 0853 02/13/23 1200  BP: 129/66 129/66 (!) 115/58   Pulse: 68 67 61   Resp: 20 17 15    Temp: 98.5 F (36.9 C) 97.8 F (36.6 C) 98.3 F (36.8 C) 98.2 F (36.8 C)  TempSrc: Oral Oral Oral Oral  SpO2: 96% 98%  100%  Weight:      Height:        General: Pt is alert, awake, not in acute distress Cardiovascular: RRR, S1/S2 +, no rubs, no gallops Respiratory: CTA bilaterally, no wheezing, no rhonchi Abdominal: Soft, NT, ND, bowel sounds + Extremities: no edema, no cyanosis    The results of significant diagnostics from this hospitalization (including imaging, microbiology, ancillary and laboratory) are listed below for reference.     Microbiology: No results found for this or any previous visit (from the past 240 hour(s)).   Labs: BNP (last 3 results) No results for input(s): "BNP" in the last 8760 hours. Basic Metabolic Panel: Recent Labs  Lab 02/10/23 1940 02/11/23 0137 02/12/23 0453  NA 139 138 135  K  3.0* 3.4* 3.8  CL 102 102 101  CO2 23 23 26   GLUCOSE 77 172* 128*  BUN 24* 19 16  CREATININE 0.93 0.89 0.90  CALCIUM 9.3 9.4 9.0  MG  --  1.7 2.1  PHOS  --  3.1 2.9   Liver Function Tests: Recent Labs  Lab 02/10/23 1940 02/12/23 0453  AST 25 25  ALT 24 24  ALKPHOS 74 70  BILITOT 0.4 0.7  PROT 7.0 7.0  ALBUMIN 3.5 3.3*   No results for input(s): "LIPASE", "AMYLASE" in the last 168 hours. No results for input(s): "AMMONIA" in the last 168 hours. CBC: Recent Labs  Lab 02/10/23 1940 02/11/23 0137 02/12/23 0453  WBC 7.7 8.3 11.7*  NEUTROABS 4.0  --  8.3*  HGB 10.7* 11.4* 10.7*  HCT 33.2* 33.9* 32.4*  MCV 102.8* 99.1 100.0  PLT 243 255 258   Cardiac Enzymes: No results for input(s): "CKTOTAL", "CKMB", "CKMBINDEX", "TROPONINI" in the last 168 hours. BNP: Invalid input(s): "POCBNP" CBG: Recent Labs  Lab 02/10/23 2112 02/13/23 0851  GLUCAP 84 76   D-Dimer No results for input(s): "DDIMER" in the last 72 hours.  Hgb A1c No results for input(s): "HGBA1C" in the last 72 hours. Lipid Profile No results for input(s): "CHOL", "HDL", "LDLCALC", "TRIG", "CHOLHDL", "LDLDIRECT" in the last 72 hours. Thyroid function studies No results for input(s): "TSH", "T4TOTAL", "T3FREE", "THYROIDAB" in the last 72 hours.  Invalid input(s): "FREET3" Anemia work up Recent Labs    02/12/23 0453  VITAMINB12 2,635*  FOLATE 15.8  FERRITIN 767*  TIBC 281  IRON 111  RETICCTPCT 2.2   Urinalysis    Component Value Date/Time   COLORURINE STRAW (A) 03/10/2022 0140   APPEARANCEUR CLEAR (A) 03/10/2022 0140   LABSPEC 1.038 (H) 03/10/2022 0140   LABSPEC 1.025 12/23/2013 1120   PHURINE 7.0 03/10/2022 0140   GLUCOSEU NEGATIVE 03/10/2022 0140   GLUCOSEU Negative 12/23/2013 1120   HGBUR SMALL (A) 03/10/2022 0140   BILIRUBINUR NEGATIVE 03/10/2022 0140   BILIRUBINUR Negative 12/23/2013 1120   KETONESUR NEGATIVE 03/10/2022 0140   PROTEINUR NEGATIVE 03/10/2022 0140   UROBILINOGEN 0.2  12/23/2013 1120   NITRITE NEGATIVE 03/10/2022 0140   LEUKOCYTESUR NEGATIVE 03/10/2022 0140   LEUKOCYTESUR Small 12/23/2013 1120   Sepsis Labs Recent Labs  Lab 02/10/23 1940 02/11/23 0137 02/12/23 0453  WBC 7.7 8.3 11.7*   Microbiology No results found for this or any previous visit (from the past 240 hour(s)).   Time coordinating discharge: Over 30 minutes  SIGNED:   Huey Bienenstock, MD  [facility] 02/13/2023, 1:11 PM Pager   If 7PM-7AM, please contact night-coverage [email or web address]

## 2023-02-13 NOTE — Discharge Instructions (Signed)
Follow with Primary MD [place] in 7 days   Do not drive, operating heavy machinery, perform activities at heights, swimming or participation in water activities or provide baby sitting services as your were admitted for siezures until you have seen by Primary MD or a Neurologist and advised to do so again.  Get CBC, CMP, 2 view Chest X ray checked  by Primary MD next visit.    Activity: As tolerated with Full fall precautions use walker/cane & assistance as needed   Disposition Home **   Diet: Heart Healthy ** , with feeding assistance and aspiration precautions.  For Heart failure patients - Check your Weight same time everyday, if you gain over 2 pounds, or you develop in leg swelling, experience more shortness of breath or chest pain, call your Primary MD immediately. Follow Cardiac Low Salt Diet and 1.5 lit/day fluid restriction.   On your next visit with your primary care physician please Get Medicines reviewed and adjusted.   Please request your Prim.MD to go over all Hospital Tests and Procedure/Radiological results at the follow up, please get all Hospital records sent to your Prim MD by signing hospital release before you go home.   If you experience worsening of your admission symptoms, develop shortness of breath, life threatening emergency, suicidal or homicidal thoughts you must seek medical attention immediately by calling 911 or calling your MD immediately  if symptoms less severe.  You Must read complete instructions/literature along with all the possible adverse reactions/side effects for all the Medicines you take and that have been prescribed to you. Take any new Medicines after you have completely understood and accpet all the possible adverse reactions/side effects.    Do not drive when taking Pain medications.    Do not take more than prescribed Pain, Sleep and Anxiety Medications  Special Instructions: If you have smoked or chewed  Tobacco  in the last 2 yrs please stop smoking, stop any regular Alcohol  and or any Recreational drug use.  Wear Seat belts while driving.   Please note  You were cared for by a hospitalist during your hospital stay. If you have any questions about your discharge medications or the care you received while you were in the hospital after you are discharged, you can call the unit and asked to speak with the hospitalist on call if the hospitalist that took care of you is not available. Once you are discharged, your primary care physician will handle any further medical issues. Please note that NO REFILLS for any discharge medications will be authorized once you are discharged, as it is imperative that you return to your primary care physician (or establish a relationship with a primary care physician if you do not have one) for your aftercare needs so that they can reassess your need for medications and monitor your lab values.

## 2023-02-15 ENCOUNTER — Encounter: Payer: Self-pay | Admitting: Nurse Practitioner

## 2023-02-15 NOTE — Progress Notes (Signed)
erroneous

## 2023-02-16 ENCOUNTER — Telehealth: Payer: Self-pay | Admitting: Internal Medicine

## 2023-02-16 NOTE — Telephone Encounter (Signed)
Called patient regarding next upcoming appointments, left a voicemail.

## 2023-02-21 ENCOUNTER — Ambulatory Visit: Payer: Medicare PPO | Admitting: Neurology

## 2023-02-21 ENCOUNTER — Encounter: Payer: Self-pay | Admitting: Neurology

## 2023-02-21 VITALS — BP 107/55 | HR 80 | Ht 63.0 in | Wt 131.4 lb

## 2023-02-21 DIAGNOSIS — G40209 Localization-related (focal) (partial) symptomatic epilepsy and epileptic syndromes with complex partial seizures, not intractable, without status epilepticus: Secondary | ICD-10-CM | POA: Diagnosis not present

## 2023-02-21 MED ORDER — LORAZEPAM 0.5 MG PO TABS: ORAL_TABLET | ORAL | 5 refills | Status: DC

## 2023-02-21 MED ORDER — LEVETIRACETAM 1000 MG PO TABS: 1000.0000 mg | ORAL_TABLET | Freq: Two times a day (BID) | ORAL | 3 refills | Status: DC

## 2023-02-21 NOTE — Patient Instructions (Signed)
Good to see you doing better.  Increase Levetiracetam (Keppra) to 1000mg  twice a day. With your current bottle of Keppra 500mg : take 2 tablets twice a day. Once finished, your new bottle will be for Keppra 1000mg : take 1 tablet twice a day  2. Proceed with home PT and OT  3. Follow-up as scheduled in August, call for any changes   Seizure Precautions: 1. If medication has been prescribed for you to prevent seizures, take it exactly as directed.  Do not stop taking the medicine without talking to your doctor first, even if you have not had a seizure in a long time.   2. Avoid activities in which a seizure would cause danger to yourself or to others.  Don't operate dangerous machinery, swim alone, or climb in high or dangerous places, such as on ladders, roofs, or girders.  Do not drive unless your doctor says you may.  3. If you have any warning that you may have a seizure, lay down in a safe place where you can't hurt yourself.    4.  No driving for 6 months from last seizure, as per [facility].   Please refer to the following link on the Epilepsy Foundation of America's website for more information: http://www.epilepsyfoundation.org/answerplace/Social/driving/drivingu.cfm   5.  Maintain good sleep hygiene.  6.  Contact your doctor if you have any problems that may be related to the medicine you are taking.  7.  Call 911 and bring the patient back to the ED if:        A.  The seizure lasts longer than 5 minutes.       B.  The patient doesn't awaken shortly after the seizure  C.  The patient has new problems such as difficulty seeing, speaking or moving  D.  The patient was injured during the seizure  E.  The patient has a temperature over 102 F (39C)  F.  The patient vomited and now is having trouble breathing

## 2023-02-21 NOTE — Progress Notes (Signed)
NEUROLOGY FOLLOW UP OFFICE NOTE  Tabitha Ramirez [id number] 05/19/1946  HISTORY OF PRESENT ILLNESS: I had the pleasure of seeing Tabitha Ramirez in follow-up in the neurology clinic on 02/21/2023.  The patient was last seen 2 months ago for seizures. She presents for an earlier visit after recent hospitalization for seizures. She is again accompanied by her daughter Tabitha Ramirez who helps supplement the history today.  Records and images were personally reviewed where available.  She was admitted to [facility] from May 18-21, 2024 after she started having left-sided weakness then focal motor activity of the left arm and leg. She recalls going to the bathroom and trying to get up but feeling weak. She could see her left side jerking and could talk/communicate. Tabitha Ramirez reports she did not lose consciousness/awareness. She was given IM Ativan and Keppra. Initial exam showed a left hemianopia and 4/5 left arm and leg strength, able to lift arm despite clonic activity. Overnight EEG showed right centroparietal slowing, no epileptiform discharges. Brain MRI with and without contrast 02/12/23 showed postsurgical changes from right parietal craniotomy; previous contrast-enhancing lesion in left frontal lobe not visualized. She was discharged home on Levetiracetam 1500mg  BID but Tabitha Ramirez reports she has been taking Levetiracetam 500mg  TID.   No further seizures since 02/10/23. She feels the left side is still slightly weaker than the right. She denies any focal paresthesias and states neuropathy is stable. No staring/unresponsive episodes. No headaches, dizziness, vision changes, no falls since hospitalization. She was using a cane prior to hospitalization but now uses a walker. They are awaiting home PT/OT. She is sleeping okay but does feel drowsy in the daytime. Mood is okay. Appetite is "so-so," she is not happy with low sodium diet restriction.    History on Initial Assessment 12/12/2022: This is a pleasant 77 year old  right-handed woman with a history of hyperlipidemia, metastatic breast cancer with brain metastasis, presenting to establish care. Records were reviewed and will be summarized as follows. She was in her usual state of health until 03/09/22 when she noticed that she could see something in front of her but could not grab it, she was trying to put her top on but could not figure out how to put her clothes on. She called her daughter and told her about her symptoms, her son-in-law came in and noticed the confusion. Initially there was no focal weakness, but symptoms progressed as she arrived at the ER where she had a left facial droop, left arm/leg drift, and decreased sensation and ataxia. No jerking or convulsive activity. EEG showed diffuse slowing in addition to lateralized right hemisphere slowing. I personally reviewed initial brain MRI with and without contrast showing a 3 x 2.3 x 1.8 cm irregular enhancing mass in the right parietal lobe with localized vasogenic edema. She underwent craniotomy with tumor resection on 03/11/22 with pathology showing metastatic carcinoma compatible with known mammary primary. She has been seeing Oncology and underwent radiation and chemotherapy, her last brain MRI with and without contrast in 10/2022 showed prior surgical changes in the right parietal lobe, thin enhancement along the margin of resection, particularly inferior shows slight regression, punctate focus of surface enhancement of left frontal lobe stable from 02/2022 and unlikely to represent a viable metastatic lesion, continue to monitor.   She denies any further left-sided symptoms or apraxia since 02/2022. Tabitha Ramirez denies any staring/unresponsive episodes. No gaps in time, olfactory/gustatory hallucinations, deja vu, rising epigastric sensation, focal numbness/tingling/weakness, myoclonic jerks. She has been on Levetiracetam 500mg  BID  without side effects. She had been doing well with no headaches until last week when  she started telling Tabitha Ramirez about a sharp pain in the surgical site on the right, as well as behind her left eye. It lasted only a few minutes, no nausea/vomiting, she did not take any pain medication.Tabitha Ramirez reports concern for keloid formation in the past week and a half. She has a history of keloid formation after ear surgery years ago. She denies any dizziness, vision changes, dysarthria/dysphagia, bowel/bladder dysfunction. She has some neck/back pain from arthritis. She has numbness and stiffness in her hands and feet that she attributes to neuropathy. 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 At that time, she presented with gait difficulty, distal arm and leg muscle atrophy, areflexia. Spinal fluid testing in 2015 showed elevated CSF protein 125, nerve biopsy in 2015 showed moderately severe loss of myelinated fibers, no evidence of inflammation, vasculitis, amyloid deposition. She was treated with IVIG but did not continue treatment after 2015. She ambulates with a walker at home and uses a cane today. No recent falls. Sleep is good. Memory and mood are good. She manages her own medications. She lives with her daughter, son-in-law, and 32 year old grandson. She does not drive.   Epilepsy Risk Factors:  Solitary intracranial metastasis in right parietal lobe s/p resection. Otherwise she had a normal birth and early development.  There is no history of febrile convulsions, CNS infections such as meningitis/encephalitis, significant traumatic brain injury, or family history of seizures.   PAST MEDICAL HISTORY: Past Medical History:  Diagnosis Date   Allergy    Anemia    Arthritis    "joints" (09/29/2013)   Breast cancer (HCC)    GERD (gastroesophageal reflux disease)    Heart murmur    History of blood transfusion    "related to chemo/breast cancer" (09/29/2013)   Hypertension    Migraines    Personal history of chemotherapy     Personal history of radiation therapy    Radiation 11/05/13-12/24/13   Right breast    TMJ (temporomandibular joint syndrome)    "left; just dx'd" (09/29/2013    MEDICATIONS: Current Outpatient Medications on File Prior to Visit  Medication Sig Dispense Refill   acetaminophen (TYLENOL) 325 MG tablet Take 1-2 tablets (325-650 mg total) by mouth every 6 (six) hours as needed for mild pain.     anastrozole (ARIMIDEX) 1 MG tablet Take 1 tablet (1 mg total) by mouth daily. 90 tablet 3   Cholecalciferol (D3-1000) 25 MCG (1000 UT) capsule Take 1 capsule (1,000 Units total) by mouth daily. 30 capsule 0   ferrous sulfate 325 (65 FE) MG tablet Take 1 tablet (325 mg total) by mouth daily with breakfast. 30 tablet 3   levETIRAcetam (KEPPRA) 500 MG tablet Take 3 tablets (1,500 mg total) by mouth 2 (two) times daily. 180 tablet 0   levothyroxine (SYNTHROID) 50 MCG tablet Take 1 tablet (50 mcg total) by mouth daily. 30 tablet 0   LORazepam (ATIVAN) 0.5 MG tablet Take 1 tablet (0.5 mg total) by mouth every 8 (eight) hours as needed for anxiety (take 1 tablet as needed for anxiety every 8 hours). 30 tablet 0   oxyCODONE-acetaminophen (PERCOCET/ROXICET) 5-325 MG tablet Take 1 tablet by mouth every 8 (eight) hours as needed for severe pain. 90 tablet 0   potassium chloride (KLOR-CON) 10 MEQ tablet Take 10 mEq by mouth daily.     rosuvastatin (CRESTOR) 10  MG tablet Take 1 tablet (10 mg total) by mouth daily. 30 tablet 0   vitamin B-12 (CYANOCOBALAMIN) 1000 MCG tablet Take 1 tablet (1,000 mcg total) by mouth daily. 30 tablet 0   No current facility-administered medications on file prior to visit.    ALLERGIES: Allergies  Allergen Reactions   Nsaids Nausea And Vomiting and Other (See Comments)    Pt states stomach ulcers, vomiting, and bleeding.   Penicillins Rash    Other reaction(s): Face swelling    FAMILY HISTORY: Family History  Problem Relation Age of Onset   Cancer Mother 75       Unknown type of  cancer   Congestive Heart Failure Mother    Breast cancer Mother    Colon cancer Brother    Diabetes Brother 33   Heart disease Brother        AMI 05/22/2012   Congestive Heart Failure Brother    Cancer Maternal Grandmother 33       Unknown type of cancer   Breast cancer Maternal Grandmother     SOCIAL HISTORY: Social History   Socioeconomic History   Marital status: Divorced    Spouse name: n/a   Number of children: 1   Years of education: 17   Highest education level: Not on file  Occupational History   Occupation: retired    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 Currently work as Lawyer   Right handed    Lives with daughter and son in law    Social Determinants of Health   Financial Resource Strain: Not on file  Food Insecurity: Not on file  Transportation Needs: Not on file  Physical Activity: Not on file  Stress: Not on file  Social Connections: Not on file  Intimate Partner Violence: Not on file     PHYSICAL EXAM: Vitals:   02/21/23 1011  BP: (!) 107/55  Pulse: 80  SpO2: 99%   General: No acute distress Head:  Normocephalic/atraumatic Skin/Extremities: No rash, no edema Neurological Exam: alert and awake. No aphasia or dysarthria. Fund of knowledge is appropriate.  Attention and concentration are normal.   Cranial nerves: Pupils equal, round. Extraocular movements intact with no nystagmus. Appears to have some extinction  to double simultaneous stimulation on left lower quadrant. No facial asymmetry.  Motor: Bulk and tone normal, muscle strength 5/5 throughout with no pronator drift, good finger taps, RAMs, no orbiting. Sensation intact to temperature. Reflexes +1 throughout. Finger to nose testing intact.  Gait narrow-based with walker, no ataxia.    IMPRESSION: This is a pleasant 77 yo RH woman with a history of hyperlipidemia, neuropathy, metastatic breast cancer with brain metastasis with subsequent seizures. Initial seizure presented with confusion/apraxia and left-sided weakness, however on 02/10/23 she had focal motor status epilepticus with left arm/leg jerking/twitching that resolved with IV lorazepam and  Levetiracetam in the ER. No clear triggers. EEG after seizure cessation showed right centroparietal slowing. MRI brain no acute changes, post-surgical changes on the right. She was discharged home on Levetiracetam 1500mg  BID (previously on 500mg  BID), but since home has been on 500mg  TID. We discussed streamlining and taking Levetiracetam 1000mg  BID, updated prescription sent. Continue close supervision, proceed with home PT/OT. She does not drive. Follow-up as scheduled in August 2024, call for any changes.   Thank you for allowing me to participate in her care.  Please do not hesitate to call for any questions or concerns.   Patrcia Dolly, M.D.   CC: [facility]

## 2023-02-26 ENCOUNTER — Encounter: Payer: Self-pay | Admitting: Nurse Practitioner

## 2023-02-26 ENCOUNTER — Inpatient Hospital Stay: Payer: Medicare PPO | Attending: Hematology and Oncology | Admitting: Nurse Practitioner

## 2023-02-26 DIAGNOSIS — R634 Abnormal weight loss: Secondary | ICD-10-CM | POA: Diagnosis not present

## 2023-02-26 DIAGNOSIS — Z515 Encounter for palliative care: Secondary | ICD-10-CM

## 2023-02-26 DIAGNOSIS — C50911 Malignant neoplasm of unspecified site of right female breast: Secondary | ICD-10-CM | POA: Diagnosis not present

## 2023-02-26 DIAGNOSIS — R530 Neoplastic (malignant) related fatigue: Secondary | ICD-10-CM

## 2023-02-26 DIAGNOSIS — G893 Neoplasm related pain (acute) (chronic): Secondary | ICD-10-CM

## 2023-02-26 DIAGNOSIS — C7931 Secondary malignant neoplasm of brain: Secondary | ICD-10-CM

## 2023-02-26 DIAGNOSIS — R630 Anorexia: Secondary | ICD-10-CM

## 2023-02-26 NOTE — Progress Notes (Signed)
Palliative Medicine [facility]  [phone] [phone]   Name: Tabitha Ramirez Date: 02/26/2023 MRN: [id number]  DOB: [birth year]  Patient Care Team: [place] [place] [facility] as PCP - General (Family Medicine) Magrinat, Valentino Hue, MD (Inactive) as Consulting Physician (Oncology) Pickenpack-Cousar, Arty Baumgartner, NP as Nurse Practitioner (Nurse Practitioner) Van Clines, MD as Consulting Physician (Neurology)   I connected with Tabitha Ramirez on 02/26/23 at  2:30 PM EDT by phone and verified that I am speaking with the correct person using two identifiers.   I discussed the limitations, risks, security and privacy concerns of performing an evaluation and management service by telemedicine and the availability of in-person appointments. I also discussed with the patient that there may be a patient responsible charge related to this service. The patient expressed understanding and agreed to proceed.   Other persons participating in the visit and their role in the encounter: daughter Tabitha Ramirez   Patient's location: home  Provider's location: [facility]   Chief Complaint: follow up of symptom mangement   INTERVAL HISTORY: Tabitha Ramirez is a 77 y.o. 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  Palliative ask to see for symptom management and goals of care.   SOCIAL HISTORY:     reports that she has quit smoking. Her smoking use included cigarettes. She has never used smokeless tobacco. She reports that she does not drink alcohol and does not use drugs.  ADVANCE DIRECTIVES: none in place   CODE STATUS: Full Code  PAST MEDICAL HISTORY: Past Medical History:  Diagnosis Date   Allergy    Anemia    Arthritis    "joints" (09/29/2013)   Breast cancer (HCC)    GERD (gastroesophageal reflux disease)    Heart murmur    History of blood transfusion    "related to chemo/breast cancer"  (09/29/2013)   Hypertension    Migraines    Personal history of chemotherapy    Personal history of radiation therapy    Radiation 11/05/13-12/24/13   Right breast    TMJ (temporomandibular joint syndrome)    "left; just dx'd" (09/29/2013    ALLERGIES:  is allergic to nsaids and penicillins.  MEDICATIONS:  Current Outpatient Medications  Medication Sig Dispense Refill   acetaminophen (TYLENOL) 325 MG tablet Take 1-2 tablets (325-650 mg total) by mouth every 6 (six) hours as needed for mild pain.     anastrozole (ARIMIDEX) 1 MG tablet Take 1 tablet (1 mg total) by mouth daily. 90 tablet 3   Cholecalciferol (D3-1000) 25 MCG (1000 UT) capsule Take 1 capsule (1,000 Units total) by mouth daily. 30 capsule 0   ferrous sulfate 325 (65 FE) MG tablet Take 1 tablet (325 mg total) by mouth daily with breakfast. 30 tablet 3   levETIRAcetam (KEPPRA) 1000 MG tablet Take 1 tablet (1,000 mg total) by mouth 2 (two) times daily. 180 tablet 3   levothyroxine (SYNTHROID) 50 MCG tablet Take 1 tablet (50 mcg total) by mouth daily. 30 tablet 0   LORazepam (ATIVAN) 0.5 MG tablet Take 1 tablet as needed for seizure. Do not take more than 3 a week. 10 tablet 5   oxyCODONE-acetaminophen (PERCOCET/ROXICET) 5-325 MG tablet Take 1 tablet by mouth every 8 (eight) hours as needed for severe pain. 90 tablet 0   potassium chloride (KLOR-CON) 10 MEQ tablet Take 10 mEq by mouth daily.     rosuvastatin (CRESTOR) 10 MG tablet Take 1 tablet (  10 mg total) by mouth daily. 30 tablet 0   vitamin B-12 (CYANOCOBALAMIN) 1000 MCG tablet Take 1 tablet (1,000 mcg total) by mouth daily. 30 tablet 0   No current facility-administered medications for this visit.    VITAL SIGNS: T 98.5, HR 84, R 16, BP 163/87   Estimated body mass index is 23.28 kg/m as calculated from the following:   Height as of 02/21/23: 5\' 3"  (1.6 m).   Weight as of 02/21/23: 131 lb 6.4 oz (59.6 kg).   PERFORMANCE STATUS (ECOG) : 1 - Symptomatic but completely  ambulatory    IMPRESSION: I connected with patient and daughter, Tabitha Ramirez for follow-up. Tabitha Ramirez is s/p recent hospitalization due to seizure activity. No active seizures since discharge. Daughter reports she is doing much better. Utilizing a walker now.   Patient was discharged on low sodium diet and does not like the changes in her food or diet. Daughter reports their normal food intake is mostly organic however some changes have been made to support a low sodium diet. Unfortunately since then patient's appetite has declined and as a result experiencing some weight loss. Discussed at length with daughter. Blood pressure has been soft with most recent 107/55. Denies any swelling. Advised it is ok for patient to have regular diet in moderation.   Pain is well controlled. Will continue support and follow.    PLAN: Continue Oxycodone/APAP 5/325 mg every 8 hours as needed for pain Continue Lorazepam 0.5 mg as needed for anxiety.  Advance to regular diet as tolerated  We will continue to closely monitor and adjust medications for symptom management. Ongoing goals of care discussions and support as needed I will plan to see patient back in 4-6 weeks in collaboration with her oncology appointments.  Daughter knows to contact our office sooner if needed.   Patient expressed understanding and was in agreement with this plan. She also understands that She can call the clinic at any time with any questions, concerns, or complaints.   Any controlled substances utilized were prescribed in the context of palliative care. PDMP has been reviewed.    Visit consisted of counseling and education dealing with the complex and emotionally intense issues of symptom management and palliative care in the setting of serious and potentially life-threatening illness.Greater than 50%  of this time was spent counseling and coordinating care related to the above assessment and plan.  Tabitha Ramirez,  AGPCNP-BC  Palliative Medicine Team/[facility]  *Please note that this is a verbal dictation therefore any spelling or grammatical errors are due to the "Dragon Medical One" system interpretation.

## 2023-02-28 ENCOUNTER — Telehealth: Payer: Self-pay | Admitting: Internal Medicine

## 2023-02-28 NOTE — Telephone Encounter (Signed)
Rescheduled appointment per patients daughters request. [name] is aware of the changes made to the patients upcoming appointment.

## 2023-03-07 ENCOUNTER — Other Ambulatory Visit: Payer: Self-pay | Admitting: Nurse Practitioner

## 2023-03-07 DIAGNOSIS — C50911 Malignant neoplasm of unspecified site of right female breast: Secondary | ICD-10-CM

## 2023-03-07 DIAGNOSIS — G893 Neoplasm related pain (acute) (chronic): Secondary | ICD-10-CM

## 2023-03-07 DIAGNOSIS — Z515 Encounter for palliative care: Secondary | ICD-10-CM

## 2023-03-07 MED ORDER — OXYCODONE-ACETAMINOPHEN 5-325 MG PO TABS
1.0000 | ORAL_TABLET | Freq: Three times a day (TID) | ORAL | 0 refills | Status: DC | PRN
Start: 2023-03-07 — End: 2023-04-04

## 2023-03-13 ENCOUNTER — Ambulatory Visit (HOSPITAL_COMMUNITY): Payer: Medicare PPO

## 2023-03-15 ENCOUNTER — Ambulatory Visit: Payer: Medicare PPO | Admitting: Internal Medicine

## 2023-03-16 ENCOUNTER — Telehealth: Payer: Self-pay | Admitting: Internal Medicine

## 2023-03-16 NOTE — Telephone Encounter (Signed)
Rescheduled June appointment per patient's request due to feeling ill. Patient is notified of rescheduled upcoming appointments.

## 2023-03-20 ENCOUNTER — Ambulatory Visit: Payer: Medicare PPO | Admitting: Internal Medicine

## 2023-03-21 ENCOUNTER — Other Ambulatory Visit: Payer: Self-pay | Admitting: Nurse Practitioner

## 2023-04-02 ENCOUNTER — Inpatient Hospital Stay (HOSPITAL_COMMUNITY)
Admission: EM | Admit: 2023-04-02 | Discharge: 2023-04-05 | DRG: 054 | Disposition: A | Discharge status: Home / Self Care | Payer: Medicare PPO | Attending: Internal Medicine | Admitting: Internal Medicine

## 2023-04-02 ENCOUNTER — Inpatient Hospital Stay: Payer: Medicare PPO | Attending: Hematology and Oncology | Admitting: Internal Medicine

## 2023-04-02 ENCOUNTER — Encounter: Payer: Self-pay | Admitting: Internal Medicine

## 2023-04-02 ENCOUNTER — Other Ambulatory Visit: Payer: Self-pay

## 2023-04-02 ENCOUNTER — Emergency Department (HOSPITAL_COMMUNITY): Payer: Medicare PPO

## 2023-04-02 ENCOUNTER — Encounter (HOSPITAL_COMMUNITY): Payer: Self-pay | Admitting: Emergency Medicine

## 2023-04-02 VITALS — BP 143/78 | HR 58 | Temp 97.3°F | Resp 18 | Wt 134.8 lb

## 2023-04-02 DIAGNOSIS — Z803 Family history of malignant neoplasm of breast: Secondary | ICD-10-CM

## 2023-04-02 DIAGNOSIS — Z8711 Personal history of peptic ulcer disease: Secondary | ICD-10-CM | POA: Diagnosis not present

## 2023-04-02 DIAGNOSIS — Z800 Family history of malignant neoplasm of digestive organs: Secondary | ICD-10-CM | POA: Diagnosis not present

## 2023-04-02 DIAGNOSIS — E039 Hypothyroidism, unspecified: Secondary | ICD-10-CM | POA: Diagnosis not present

## 2023-04-02 DIAGNOSIS — Z79899 Other long term (current) drug therapy: Secondary | ICD-10-CM

## 2023-04-02 DIAGNOSIS — T424X5A Adverse effect of benzodiazepines, initial encounter: Secondary | ICD-10-CM | POA: Diagnosis present

## 2023-04-02 DIAGNOSIS — Z90710 Acquired absence of both cervix and uterus: Secondary | ICD-10-CM | POA: Diagnosis not present

## 2023-04-02 DIAGNOSIS — C7931 Secondary malignant neoplasm of brain: Secondary | ICD-10-CM | POA: Diagnosis not present

## 2023-04-02 DIAGNOSIS — E785 Hyperlipidemia, unspecified: Secondary | ICD-10-CM | POA: Insufficient documentation

## 2023-04-02 DIAGNOSIS — Z79890 Hormone replacement therapy (postmenopausal): Secondary | ICD-10-CM | POA: Diagnosis not present

## 2023-04-02 DIAGNOSIS — Z880 Allergy status to penicillin: Secondary | ICD-10-CM | POA: Diagnosis not present

## 2023-04-02 DIAGNOSIS — G40909 Epilepsy, unspecified, not intractable, without status epilepticus: Secondary | ICD-10-CM | POA: Diagnosis not present

## 2023-04-02 DIAGNOSIS — Z833 Family history of diabetes mellitus: Secondary | ICD-10-CM

## 2023-04-02 DIAGNOSIS — I10 Essential (primary) hypertension: Secondary | ICD-10-CM | POA: Diagnosis not present

## 2023-04-02 DIAGNOSIS — D649 Anemia, unspecified: Secondary | ICD-10-CM | POA: Diagnosis present

## 2023-04-02 DIAGNOSIS — Z87891 Personal history of nicotine dependence: Secondary | ICD-10-CM | POA: Diagnosis not present

## 2023-04-02 DIAGNOSIS — Z923 Personal history of irradiation: Secondary | ICD-10-CM | POA: Diagnosis not present

## 2023-04-02 DIAGNOSIS — G929 Unspecified toxic encephalopathy: Secondary | ICD-10-CM | POA: Diagnosis not present

## 2023-04-02 DIAGNOSIS — Z886 Allergy status to analgesic agent status: Secondary | ICD-10-CM

## 2023-04-02 DIAGNOSIS — G629 Polyneuropathy, unspecified: Secondary | ICD-10-CM | POA: Diagnosis present

## 2023-04-02 DIAGNOSIS — R531 Weakness: Secondary | ICD-10-CM

## 2023-04-02 DIAGNOSIS — R569 Unspecified convulsions: Principal | ICD-10-CM

## 2023-04-02 DIAGNOSIS — T426X5A Adverse effect of other antiepileptic and sedative-hypnotic drugs, initial encounter: Secondary | ICD-10-CM | POA: Diagnosis not present

## 2023-04-02 DIAGNOSIS — Z9221 Personal history of antineoplastic chemotherapy: Secondary | ICD-10-CM | POA: Diagnosis not present

## 2023-04-02 DIAGNOSIS — C50911 Malignant neoplasm of unspecified site of right female breast: Secondary | ICD-10-CM | POA: Diagnosis not present

## 2023-04-02 DIAGNOSIS — Z79811 Long term (current) use of aromatase inhibitors: Secondary | ICD-10-CM

## 2023-04-02 DIAGNOSIS — G43909 Migraine, unspecified, not intractable, without status migrainosus: Secondary | ICD-10-CM | POA: Diagnosis present

## 2023-04-02 DIAGNOSIS — Z8249 Family history of ischemic heart disease and other diseases of the circulatory system: Secondary | ICD-10-CM

## 2023-04-02 DIAGNOSIS — Z853 Personal history of malignant neoplasm of breast: Secondary | ICD-10-CM

## 2023-04-02 DIAGNOSIS — M1990 Unspecified osteoarthritis, unspecified site: Secondary | ICD-10-CM | POA: Diagnosis present

## 2023-04-02 DIAGNOSIS — K219 Gastro-esophageal reflux disease without esophagitis: Secondary | ICD-10-CM | POA: Diagnosis present

## 2023-04-02 LAB — URINALYSIS, ROUTINE W REFLEX MICROSCOPIC
Bilirubin Urine: NEGATIVE
Glucose, UA: NEGATIVE mg/dL
Hgb urine dipstick: NEGATIVE
Ketones, ur: NEGATIVE mg/dL
Leukocytes,Ua: NEGATIVE
Nitrite: NEGATIVE
Protein, ur: NEGATIVE mg/dL
Specific Gravity, Urine: 1.011 (ref 1.005–1.030)
pH: 7 (ref 5.0–8.0)

## 2023-04-02 LAB — CBC WITH DIFFERENTIAL/PLATELET
Abs Immature Granulocytes: 0.01 10*3/uL (ref 0.00–0.07)
Basophils Absolute: 0.1 10*3/uL (ref 0.0–0.1)
Basophils Relative: 1 %
Eosinophils Absolute: 0.7 10*3/uL — ABNORMAL HIGH (ref 0.0–0.5)
Eosinophils Relative: 8 %
HCT: 32.4 % — ABNORMAL LOW (ref 36.0–46.0)
Hemoglobin: 10.7 g/dL — ABNORMAL LOW (ref 12.0–15.0)
Immature Granulocytes: 0 %
Lymphocytes Relative: 41 %
Lymphs Abs: 3.6 10*3/uL (ref 0.7–4.0)
MCH: 32.9 pg (ref 26.0–34.0)
MCHC: 33 g/dL (ref 30.0–36.0)
MCV: 99.7 fL (ref 80.0–100.0)
Monocytes Absolute: 0.8 10*3/uL (ref 0.1–1.0)
Monocytes Relative: 9 %
Neutro Abs: 3.6 10*3/uL (ref 1.7–7.7)
Neutrophils Relative %: 41 %
Platelets: 249 10*3/uL (ref 150–400)
RBC: 3.25 MIL/uL — ABNORMAL LOW (ref 3.87–5.11)
RDW: 13.1 % (ref 11.5–15.5)
WBC: 8.8 10*3/uL (ref 4.0–10.5)
nRBC: 0 % (ref 0.0–0.2)

## 2023-04-02 LAB — COMPREHENSIVE METABOLIC PANEL
ALT: 35 U/L (ref 0–44)
AST: 29 U/L (ref 15–41)
Albumin: 3.9 g/dL (ref 3.5–5.0)
Alkaline Phosphatase: 84 U/L (ref 38–126)
Anion gap: 8 (ref 5–15)
BUN: 21 mg/dL (ref 8–23)
CO2: 28 mmol/L (ref 22–32)
Calcium: 9.5 mg/dL (ref 8.9–10.3)
Chloride: 103 mmol/L (ref 98–111)
Creatinine, Ser: 0.84 mg/dL (ref 0.44–1.00)
GFR, Estimated: >60 mL/min (ref >60)
Glucose, Bld: 86 mg/dL (ref 70–99)
Potassium: 3.7 mmol/L (ref 3.5–5.1)
Sodium: 139 mmol/L (ref 135–145)
Total Bilirubin: 0.5 mg/dL (ref 0.3–1.2)
Total Protein: 8.1 g/dL (ref 6.5–8.1)

## 2023-04-02 LAB — MAGNESIUM: Magnesium: 1.8 mg/dL (ref 1.7–2.4)

## 2023-04-02 LAB — CBG MONITORING, ED: Glucose-Capillary: 75 mg/dL (ref 70–99)

## 2023-04-02 MED ORDER — LEVETIRACETAM IN NACL 1000 MG/100ML IV SOLN
1000.0000 mg | Freq: Once | INTRAVENOUS | Status: AC
  Administered 2023-04-02: 1000 mg via INTRAVENOUS
  Filled 2023-04-02: qty 100

## 2023-04-02 MED ORDER — METOCLOPRAMIDE HCL 5 MG/ML IJ SOLN
10.0000 mg | Freq: Once | INTRAMUSCULAR | Status: AC
  Administered 2023-04-02: 10 mg via INTRAVENOUS
  Filled 2023-04-02: qty 2

## 2023-04-02 MED ORDER — DIPHENHYDRAMINE HCL 50 MG/ML IJ SOLN
12.5000 mg | Freq: Once | INTRAMUSCULAR | Status: AC
  Administered 2023-04-02: 12.5 mg via INTRAVENOUS
  Filled 2023-04-02: qty 1

## 2023-04-02 MED ORDER — ACETAMINOPHEN 325 MG PO TABS
650.0000 mg | ORAL_TABLET | Freq: Once | ORAL | Status: AC
  Administered 2023-04-02: 650 mg via ORAL
  Filled 2023-04-02: qty 2

## 2023-04-02 MED ORDER — LACTATED RINGERS IV BOLUS
1000.0000 mL | Freq: Once | INTRAVENOUS | Status: AC
  Administered 2023-04-02: 1000 mL via INTRAVENOUS

## 2023-04-02 MED ORDER — LORAZEPAM 2 MG/ML IJ SOLN
1.0000 mg | Freq: Once | INTRAMUSCULAR | Status: AC
  Administered 2023-04-02: 1 mg via INTRAVENOUS
  Filled 2023-04-02: qty 1

## 2023-04-02 MED ORDER — SODIUM CHLORIDE 0.9 % IV SOLN
20.0000 mg/kg | Freq: Once | INTRAVENOUS | Status: AC
  Administered 2023-04-02: 1220 mg via INTRAVENOUS
  Filled 2023-04-02: qty 10

## 2023-04-02 NOTE — ED Notes (Signed)
Plan discussed with [facility] ER Charge nurse Candace and pt will complete Brain MRI there d/t pt is critical care level and will need a nurse to accompany to monitor pt throughout the exam.

## 2023-04-02 NOTE — ED Notes (Signed)
[facility] CCT crew at bedside at this time. Pt cleaned and incontinence pad changed at this time.

## 2023-04-02 NOTE — Plan of Care (Signed)
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  Concern for some left arm shaking for ongoing seizures. Given Keppra 1 g IV load in the ER  Recommendations Ativan 2 mg IV x 1 Additional 1 g Keppra IV x 1 Fosphenytoin 20 mg/kg phenytoin equivalent IV x 1 Transfer to [facility] EEG once arrives at [facility] MRI brain with and without contrast  The plan was discussed with Dr. Durwin Nora who had called in this consult.  Neurology to evaluate the patient once at [facility].   -- Milon Dikes, MD Neurologist [facility] Pager: [phone]

## 2023-04-02 NOTE — Progress Notes (Signed)
   PCCM transfer request    Sending physician:Dr. 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  Neurology contacted and recommended transfer to [facility] for long-term EEG and additional AEDs as per their note in EMR.  Hospitalist at [facility] refused admission per report.  ED physician contacted PCCM for admission.  Discussed with [facility], reports that ICU beds are tight.  Contacted ED physician at [facility] long and recommended ED to ED transfer to expedite patient getting out of [facility] long ED and to [facility] to facilitate EEG placement.  Recommendations made prior to transfer: n/a  Transfer accepted: No, can assess for admission to ICU versus admission to [facility] once in [facility] ED    Tabitha Ramirez [facility] 04/02/23 10:41 PM [facility]  For contact information, see Amion. If no response to pager, please call PCCM consult pager. After hours, 7PM- 7AM, please call Elink.

## 2023-04-02 NOTE — ED Notes (Signed)
Ground CCT arranged at this time with Selena Batten at [facility] for ER to ER transfer to [facility] ER.

## 2023-04-02 NOTE — ED Provider Notes (Signed)
EMERGENCY DEPARTMENT AT [facility] Provider Note   CSN: [id number] Arrival date & time: 04/02/23  1736     History  Chief Complaint  Patient presents with   Seizures    Tabitha Ramirez is a 76 y.o. female.   Seizures Patient presents for seizure.  Medical history includes anemia, GERD, HTN, migraine headaches.  She has history of breast cancer with metastases to brain.  She underwent right parietal craniotomy with resection 1 year ago.  He was around that time that she began having seizures.  She is on Keppra 1000 mg twice daily.  Last seizure episode prior to today was in May.  Keppra was increased at the time.  Today, she had a routine follow-up appointment with her oncologist.  At the time, she was in her normal state of health.  After she returned home, she had a witnessed seizure at approximately 4:30 PM.  Seizure was witnessed by daughter, who accompanies the patient in the ED.  Daughter states that it was typical of her prior seizures.  They usually start in her arm and progressed throughout her body.  Estimated duration is 5 to 10 minutes.  After seizure episode, patient was given a 0.5 mg oral dose of Ativan.  Patient endorsed left arm heaviness after her seizure.  Daughter states that this is typical for her.  Patient currently endorses headache with pain behind her eyes.  Daughter reports that patient is typically able to have normal conversations.  She continues to seem "out of it".     Home Medications Prior to Admission medications   Medication Sig Start Date End Date Taking? Authorizing Provider  acetaminophen (TYLENOL) 325 MG tablet Take 1-2 tablets (325-650 mg total) by mouth every 6 (six) hours as needed for mild pain. 02/13/23   Elgergawy, Leana Roe, MD  anastrozole (ARIMIDEX) 1 MG tablet Take 1 tablet (1 mg total) by mouth daily. 04/04/22   Serena Croissant, MD  Cholecalciferol (D3-1000) 25 MCG (1000 UT) capsule Take 1 capsule (1,000 Units total) by mouth  daily. 03/27/22   Angiulli, Mcarthur Rossetti, PA-C  ferrous sulfate 325 (65 FE) MG tablet Take 1 tablet (325 mg total) by mouth daily with breakfast. 03/27/22   Angiulli, Mcarthur Rossetti, PA-C  levETIRAcetam (KEPPRA) 1000 MG tablet Take 1 tablet (1,000 mg total) by mouth 2 (two) times daily. 02/21/23   Van Clines, MD  levothyroxine (SYNTHROID) 50 MCG tablet Take 1 tablet (50 mcg total) by mouth daily. 04/26/22   Jones Bales, NP  LORazepam (ATIVAN) 0.5 MG tablet Take 1 tablet as needed for seizure. Do not take more than 3 a week. 02/21/23   Van Clines, MD  oxyCODONE-acetaminophen (PERCOCET/ROXICET) 5-325 MG tablet Take 1 tablet by mouth every 8 (eight) hours as needed for severe pain. 03/07/23   Pickenpack-Cousar, Arty Baumgartner, NP  potassium chloride (KLOR-CON) 10 MEQ tablet Take 10 mEq by mouth daily. 03/10/22   [provider]  rosuvastatin (CRESTOR) 10 MG tablet Take 1 tablet (10 mg total) by mouth daily. 04/26/22   Jones Bales, NP  vitamin B-12 (CYANOCOBALAMIN) 1000 MCG tablet Take 1 tablet (1,000 mcg total) by mouth daily. 03/27/22   Angiulli, Mcarthur Rossetti, PA-C      Allergies    Nsaids and Penicillins    Review of Systems   Review of Systems  Neurological:  Positive for seizures and headaches.  All other systems reviewed and are negative.   Physical Exam Updated Vital Signs  BP (!) 111/53   Pulse 91   Temp 97.9 F (36.6 C) (Oral)   Resp (!) 21   Ht 5\' 3"  (1.6 m)   Wt 61 kg   SpO2 100%   BMI 23.82 kg/m  Physical Exam Vitals and nursing note reviewed.  Constitutional:      General: She is not in acute distress.    Appearance: Normal appearance. She is well-developed. She is not toxic-appearing or diaphoretic.  HENT:     Head: Normocephalic and atraumatic.     Right Ear: External ear normal.     Left Ear: External ear normal.     Nose: Nose normal.     Mouth/Throat:     Mouth: Mucous membranes are moist.  Eyes:     Extraocular Movements: Extraocular movements intact.      Conjunctiva/sclera: Conjunctivae normal.  Cardiovascular:     Rate and Rhythm: Normal rate and regular rhythm.  Pulmonary:     Effort: Pulmonary effort is normal. No respiratory distress.  Abdominal:     General: There is no distension.     Palpations: Abdomen is soft.     Tenderness: There is no abdominal tenderness.  Musculoskeletal:        General: No swelling.     Cervical back: Normal range of motion and neck supple.  Skin:    General: Skin is warm and dry.  Neurological:     Mental Status: She is alert.     Cranial Nerves: No dysarthria or facial asymmetry.     Sensory: Sensation is intact.     Motor: Weakness and pronator drift present.  Psychiatric:        Speech: Speech is delayed.        Behavior: Behavior is slowed. Behavior is cooperative.     ED Results / Procedures / Treatments   Labs (all labs ordered are listed, but only abnormal results are displayed) Labs Reviewed  CBC WITH DIFFERENTIAL/PLATELET - Abnormal; Notable for the following components:      Result Value   RBC 3.25 (*)    Hemoglobin 10.7 (*)    HCT 32.4 (*)    Eosinophils Absolute 0.7 (*)    All other components within normal limits  URINALYSIS, ROUTINE W REFLEX MICROSCOPIC - Abnormal; Notable for the following components:   Color, Urine STRAW (*)    All other components within normal limits  COMPREHENSIVE METABOLIC PANEL  MAGNESIUM  LEVETIRACETAM LEVEL  CBG MONITORING, ED    EKG None  Radiology CT HEAD WO CONTRAST  Result Date: 04/02/2023 CLINICAL DATA:  Metastatic disease EXAM: CT HEAD WITHOUT CONTRAST TECHNIQUE: Contiguous axial images were obtained from the base of the skull through the vertex without intravenous contrast. RADIATION DOSE REDUCTION: This exam was performed according to the departmental dose-optimization program which includes automated exposure control, adjustment of the mA and/or kV according to patient size and/or use of iterative reconstruction technique. COMPARISON:   MRI head 02/12/2023.  CT head 02/10/2023 FINDINGS: Brain: Right parietal craniotomy with underlying focal rounded area of hypodensity appears similar to the prior study. Surrounding white matter hypodensity in this region is unchanged. There is no acute intracranial hemorrhage, mass effect or midline shift. There is stable mild periventricular white matter hypodensity, likely chronic small vessel ischemic change. Vascular: Atherosclerotic calcifications are present within the cavernous internal carotid arteries. Skull: No acute fractures. Sinuses/Orbits: No acute finding. Other: None. IMPRESSION: 1. No acute intracranial process. 2. Right parietal craniotomy with underlying resection site appears similar  to the prior study. Electronically Signed   By: Darliss Cheney M.D.   On: 04/02/2023 20:22    Procedures Procedures    Medications Ordered in ED Medications  levETIRAcetam (KEPPRA) IVPB 1000 mg/100 mL premix (0 mg Intravenous Stopped 04/02/23 2226)  metoCLOPramide (REGLAN) injection 10 mg (10 mg Intravenous Given 04/02/23 2153)  diphenhydrAMINE (BENADRYL) injection 12.5 mg (12.5 mg Intravenous Given 04/02/23 2153)  lactated ringers bolus 1,000 mL (0 mLs Intravenous Stopped 04/02/23 2247)  acetaminophen (TYLENOL) tablet 650 mg (650 mg Oral Given 04/02/23 2154)  LORazepam (ATIVAN) injection 1 mg (1 mg Intravenous Given 04/02/23 2231)  levETIRAcetam (KEPPRA) IVPB 1000 mg/100 mL premix (0 mg Intravenous Stopped 04/02/23 2247)  fosPHENYtoin (CEREBYX) 1,220 mg PE in sodium chloride 0.9 % 50 mL IVPB (0 mg PE Intravenous Stopped 04/02/23 2330)    ED Course/ Medical Decision Making/ A&P                             Medical Decision Making Amount and/or Complexity of Data Reviewed Labs: ordered. Radiology: ordered.  Risk OTC drugs. Prescription drug management.   This patient presents to the ED for concern of seizure, this involves an extensive number of treatment options, and is a complaint that carries with it  a high risk of complications and morbidity.  The differential diagnosis includes breakthrough seizure, medication nonadherence, neoplasm, ICH   Co morbidities that complicate the patient evaluation  anemia, GERD, HTN, migraine headaches, breast cancer with brain metastases   Additional history obtained:  Additional history obtained from patient's daughter External records from outside source obtained and reviewed including EMR   Lab Tests:  I Ordered, and personally interpreted labs.  The pertinent results include: Baseline anemia, no leukocytosis, normal kidney function, normal electrolytes, no evidence of UTI   Imaging Studies ordered:  I ordered imaging studies including CT head, MRI brain I independently visualized and interpreted imaging which showed no acute findings on CT.  MRI pending  I agree with the radiologist interpretation   Cardiac Monitoring: / EKG:  The patient was maintained on a cardiac monitor.  I personally viewed and interpreted the cardiac monitored which showed an underlying rhythm of: Sinus rhythm   Consultations Obtained:  I requested consultation with the neurologist, Dr. Wilford Corner,  and discussed lab and imaging findings as well as pertinent plan - they recommend: Ativan, additional Keppra, fosphenytoin, transfer to [facility] for continuous EEG   Problem List / ED Course / Critical interventions / Medication management  Patient presents for seizure.  This was witnessed this afternoon by her daughter.  She has a history of the same but has been seizure-free for the past 2 months up until today.  She actually saw her neuro oncologist this morning, at which time she was in her normal state of health.  He has been adherent to her home Keppra.  On arrival in the ED, patient is alert and oriented.  She is slow to respond to questioning.  Daughter reports that she is not at her mental baseline.  She has left arm weakness and pronator drift which daughter states  is common for her following seizures.  When asked to raise her left leg, she raises both her left leg and left arm.  Patient was ordered 1 g of Keppra.  She endorsed a headache and Tylenol, Reglan, and Benadryl were ordered.  IV fluids were ordered.  Workup was initiated.  CT scan did not  show any acute findings.  On reassessment, patient remains awake but slow to respond and not at mental baseline.  She has what appears to be intermittent uncontrolled movements of her left upper extremity.  There is concern of ongoing partial seizures.  I discussed this with neurology, who recommends additional Keppra, Ativan, and fosphenytoin.  These additional medications were ordered.  I spoke with hospitalist for admission to [facility].  Hospitalist evaluated the patient and does not feel comfortable with floor admission.  I spoke with the intensivist, Dr. Judeth Horn, who states that there are currently no ICU beds available at [facility].  He recommended ED to ED transfer for initiation of continuous EEG.  Patient was accepted in transfer by Dr. Rodena Medin. I ordered medication including Keppra, Ativan, fosphenytoin for seizures; Reglan, Benadryl, Tylenol for headache; IV fluids for hydration Reevaluation of the patient after these medicines showed that the patient improved I have reviewed the patients home medicines and have made adjustments as needed   Social Determinants of Health:  Has access to outpatient care  CRITICAL CARE Performed by: Gloris Manchester   Total critical care time: 34 minutes  Critical care time was exclusive of separately billable procedures and treating other patients.  Critical care was necessary to treat or prevent imminent or life-threatening deterioration.  Critical care was time spent personally by me on the following activities: development of treatment plan with patient and/or surrogate as well as nursing, discussions with consultants, evaluation of patient's response to treatment,  examination of patient, obtaining history from patient or surrogate, ordering and performing treatments and interventions, ordering and review of laboratory studies, ordering and review of radiographic studies, pulse oximetry and re-evaluation of patient's condition.        Final Clinical Impression(s) / ED Diagnoses Final diagnoses:  Seizure [facility])    Rx / DC Orders ED Discharge Orders     None         Gloris Manchester, MD 04/02/23 2341

## 2023-04-02 NOTE — Progress Notes (Addendum)
The patient is a 77 year old female with past medical history significant for malignant neoplasm of breast cancer with metastasis to the brain who presents after a follow-up visit with her neuro oncologist today.  History of craniotomy with tumor resection on 03/11/2022 with pathology showing metastatic carcinoma compatible with known mammary primary.  She follows with oncology and underwent radiation and chemotherapy.  Also follows with neurology.  On AEDs prior to presentation.  Last reported seizure activity was in May 2024.  Per the daughter at bedside the patient started having seizure activity around 4 PM at home today.    Her seizure episodes persisted in the ED.  EDP discussed the case with neurology on-call who provided recommendations.  TR[facility] was asked to admit.  Presented at bedside.  The patient had another seizure episode while at bedside.  Per bedside RN this has been ongoing intermittently since 2015 p.m.  Due to concern for status epilepticus recommended higher level of care ICU for close monitoring of the patient in suspected status epilepticus.   Time: 25 minutes.

## 2023-04-02 NOTE — ED Triage Notes (Signed)
Pt with hx of brain tumor and on radiation. Pt takes keppra 1000 mg BID, next dose tonight 9 pm. Last seizure in May before today. Daughter states they left oncologist today and then pt had seizure at home, did not fall. Pt was given 0.5 mg ativan for seizure around 1630.  Pt states it feels like her left arm is heavy.

## 2023-04-02 NOTE — Progress Notes (Signed)
[facility] at [facility] [address]  [place], [place] [address] [phone]   New Patient Evaluation  Date of Service: 04/02/23 Patient Name: Tabitha Ramirez Patient MRN: [id number] Patient DOB: 12-08-1945 Provider: Henreitta Leber, MD  Identifying Statement:  Tabitha Ramirez is a 77 y.o. 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 84%; sentinel lymph node sampling 03/10/2013 showed1/2 SLN pos PR 3% PR 6% Ki-67 15% HER-2 negative   02/10/2013 Breast MRI   Right breast: Rim enhancing necrotic mass 3.4 cm with a 2 cm level I axillary lymph node   03/17/2013 - 08/25/2013 Neo-Adjuvant Chemotherapy   Dose dense Adriamycin and Cytoxan Followed by weekly Taxol and carboplatin x12; 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  Tabitha Ramirez presents today for follow up after recent admission for seizure.  She experienced an episode of "both arms shaking, lasting for several minutes".  She was not weak after the episode. Keppra was increased to 1000mg  twice per day.  Since the event, no further seizures.  Continues on observation with Dr. Pamelia Hoit for breast cancer.  Medications: Current Outpatient Medications on File Prior to Visit  Medication Sig Dispense Refill   acetaminophen (TYLENOL) 325 MG tablet Take 1-2 tablets (325-650 mg total) by mouth every 6 (six) hours as needed for mild pain.     anastrozole (ARIMIDEX) 1 MG tablet Take 1 tablet (1 mg total) by mouth daily. 90 tablet 3   Cholecalciferol (D3-1000) 25 MCG (1000 UT) capsule Take 1 capsule (1,000 Units total) by mouth daily. 30 capsule 0   ferrous sulfate 325 (65 FE) MG tablet Take 1 tablet (325 mg total) by mouth daily with breakfast. 30 tablet 3   levETIRAcetam (KEPPRA) 1000 MG tablet Take 1 tablet (1,000 mg total) by mouth 2 (two) times daily. 180 tablet 3   levothyroxine (SYNTHROID) 50 MCG tablet Take 1 tablet (50 mcg total) by mouth daily. 30 tablet 0   LORazepam (ATIVAN) 0.5 MG tablet Take 1 tablet as needed for seizure. Do not take more than 3 a week. 10 tablet 5   oxyCODONE-acetaminophen (PERCOCET/ROXICET) 5-325 MG tablet Take 1 tablet by mouth every 8 (eight) hours as needed for severe pain. 90 tablet 0   potassium chloride (KLOR-CON) 10 MEQ tablet Take 10 mEq by mouth daily.  rosuvastatin (CRESTOR) 10 MG tablet Take 1 tablet (10 mg total) by mouth daily. 30 tablet 0   vitamin B-12 (CYANOCOBALAMIN) 1000 MCG tablet Take 1 tablet (1,000 mcg total) by mouth daily. 30 tablet 0   No current facility-administered medications on file prior to visit.    Allergies:  Allergies  Allergen Reactions   Nsaids Nausea And Vomiting and Other (See Comments)    Pt  states stomach ulcers, vomiting, and bleeding.   Penicillins Rash    Other reaction(s): Face swelling   Past Medical History:  Past Medical History:  Diagnosis Date   Allergy    Anemia    Arthritis    "joints" (09/29/2013)   Breast cancer (HCC)    GERD (gastroesophageal reflux disease)    Heart murmur    History of blood transfusion    "related to chemo/breast cancer" (09/29/2013)   Hypertension    Migraines    Personal history of chemotherapy    Personal history of radiation therapy    Radiation 11/05/13-12/24/13   Right breast    TMJ (temporomandibular joint syndrome)    "left; just dx'd" (09/29/2013   Past Surgical History:  Past Surgical History:  Procedure Laterality Date   AXILLARY LYMPH NODE BIOPSY Right 03/10/2013   Procedure: AXILLARY LYMPH NODE BIOPSY;  Surgeon: Ernestene Mention, MD;  Location: [facility] OR;  Service: General;  Laterality: Right;  sentinel node with blue dye   BREAST BIOPSY     BREAST LUMPECTOMY Right 09/29/2013   needle localization w/axillary LND/notes 09/29/2013   BREAST LUMPECTOMY WITH NEEDLE LOCALIZATION AND AXILLARY LYMPH NODE DISSECTION Right 09/29/2013   Procedure: RIGHT BREAST NEEDLE LOCALIZED LUMPECTOMY AND AXILLARY LYMPH NODE DISSECTION;  Surgeon: Ernestene Mention, MD;  Location: [facility] OR;  Service: General;  Laterality: Right;   COLONOSCOPY N/A 04/30/2013   Procedure: COLONOSCOPY;  Surgeon: Graylin Shiver, MD;  Location: [facility] END[facility]COPY;  Service: Endoscopy;  Laterality: N/A;   CRANIOTOMY Right 03/11/2022   Procedure: CRANIOTOMY TUMOR EXCISION;  Surgeon: Venetia Night, MD;  Location: [facility] ORS;  Service: Neurosurgery;  Laterality: Right;   ESOPHAGOGASTRODUODENOSCOPY N/A 04/28/2013   Procedure: ESOPHAGOGASTRODUODENOSCOPY (EGD);  Surgeon: Graylin Shiver, MD;  Location: [facility];  Service: Endoscopy;  Laterality: N/A;   MASTECTOMY Right 09/29/2013   "partial"   PORT-A-CATH REMOVAL  09/29/2013   PORT-A-CATH REMOVAL Left 09/29/2013   Procedure: REMOVAL PORT-A-CATH;   Surgeon: Ernestene Mention, MD;  Location: [facility] OR;  Service: General;  Laterality: Left;   PORTACATH PLACEMENT N/A 03/10/2013   Procedure: INSERTION PORT-A-CATH WITH FLUOROSCOPY AND ULTRASOUND;  Surgeon: Ernestene Mention, MD;  Location: [facility] OR;  Service: General;  Laterality: N/A;   SURAL NERVE BX Right 05/13/2014   Procedure: SURAL NERVE BIOPSY;  Surgeon: Hewitt Shorts, MD;  Location: [facility] NEURO ORS;  Service: Neurosurgery;  Laterality: Right;  Right sural nerve biopsy   TONSILLECTOMY     TOTAL ABDOMINAL HYSTERECTOMY     With bilateral salpingo-oophorectomy   Social History:  Social History   Socioeconomic History   Marital status: Divorced    Spouse name: n/a   Number of children: 1   Years of education: 17   Highest education level: Not on file  Occupational History   Occupation: retired    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 Currently work as Lawyer   Right handed    Lives with daughter and son in law    Social Determinants of Health   Financial Resource Strain: Not on file  Food Insecurity: Not on file  Transportation Needs: Not on file  Physical Activity: Not on file  Stress: Not on file  Social Connections: Not on file  Intimate Partner Violence: Not on file   Family History:  Family History  Problem Relation Age of Onset   Cancer Mother 39       Unknown type of cancer   Congestive Heart Failure Mother    Breast cancer Mother     Colon cancer Brother    Diabetes Brother 55   Heart disease Brother        AMI 05/22/2012   Congestive Heart Failure Brother    Cancer Maternal Grandmother 40       Unknown type of cancer   Breast cancer Maternal Grandmother     Review of Systems: Constitutional: Doesn't report fevers, chills or abnormal weight loss Eyes: Doesn't report blurriness of vision Ears, nose, mouth, throat, and face: Doesn't report sore throat Respiratory: Doesn't report cough, dyspnea or wheezes Cardiovascular: Doesn't report palpitation, chest discomfort  Gastrointestinal:  Doesn't report nausea, constipation, diarrhea GU: Doesn't report incontinence Skin: Doesn't report skin rashes Neurological: Per HPI Musculoskeletal: Doesn't report joint pain Behavioral/Psych: Doesn't report anxiety  Physical Exam: Vitals:   04/02/23 0858  BP: (!) 143/78  Pulse: (!) 58  Resp: 18  Temp: (!) 97.3 F (36.3 C)  SpO2: 100%   KPS: 80. General: Alert, cooperative, pleasant, in no acute distress Head: Normal EENT: No conjunctival injection or scleral icterus.  Lungs: Resp effort normal Cardiac: Regular rate Abdomen: Non-distended abdomen Skin: No rashes cyanosis or petechiae. Extremities: No clubbing or edema  Neurologic Exam: Mental Status: Awake, alert, attentive to examiner. Oriented to self and environment. Language is fluent with intact comprehension.  Psychomotor slowing, impaired recall. Cranial Nerves: Visual acuity is grossly normal. Visual fields are full. Extra-ocular movements intact. No ptosis. Face is symmetric Motor: Tone and bulk are normal. Power is full in both arms and legs. Reflexes are symmetric, no pathologic reflexes present.  Sensory: Intact to light touch Gait: Dystaxic   Labs: I have reviewed the data as listed    Component Value Date/Time   NA 135 02/12/2023 0453   NA 140 03/30/2015 0944   K 3.8 02/12/2023 0453   K 3.8 03/30/2015 0944   CL 101 02/12/2023 0453   CL 100  03/13/2013 0918   CO2 26 02/12/2023 0453   CO2 26 03/30/2015 0944   GLUCOSE 128 (H) 02/12/2023 0453   GLUCOSE 103 03/30/2015 0944   GLUCOSE 169 (H) 03/13/2013 0918   BUN 16 02/12/2023 0453   BUN 18.9 03/30/2015 0944   CREATININE 0.90 02/12/2023 0453   CREATININE 0.9 03/30/2015 0944   CALCIUM 9.0 02/12/2023 0453   CALCIUM 9.7 03/30/2015 0944   PROT 7.0 02/12/2023 0453   PROT 7.9 03/30/2015 0944  ALBUMIN 3.3 (L) 02/12/2023 0453   ALBUMIN 3.1 (L) 03/30/2015 0944   AST 25 02/12/2023 0453   AST 20 03/30/2015 0944   ALT 24 02/12/2023 0453   ALT 19 03/30/2015 0944   ALKPHOS 70 02/12/2023 0453   ALKPHOS 100 03/30/2015 0944   BILITOT 0.7 02/12/2023 0453   BILITOT 0.45 03/30/2015 0944   GFRNONAA >60 02/12/2023 0453   GFRNONAA 64 11/29/2011 1535   GFRAA 85 (L) 05/12/2014 1238   GFRAA 74 11/29/2011 1535   Lab Results  Component Value Date   WBC 11.7 (H) 02/12/2023   NEUTROABS 8.3 (H) 02/12/2023   HGB 10.7 (L) 02/12/2023   HCT 32.4 (L) 02/12/2023   MCV 100.0 02/12/2023   PLT 258 02/12/2023    Imaging: CLINICAL DATA:  Seizure disorder, clinical change seizure,   EXAM: MRI HEAD WITHOUT AND WITH CONTRAST   TECHNIQUE: Multiplanar, multiecho pulse sequences of the brain and surrounding structures were obtained without and with intravenous contrast.   CONTRAST:  6mL GADAVIST GADOBUTROL 1 MMOL/ML IV SOLN   COMPARISON:  CT head 02/10/23, MR Head 01/30/23   FINDINGS: Brain: Postsurgical changes from a right parietal craniotomy with a subjacent resection cavity redemonstrated. There is a small amount of hyperintense signal on diffusion-weighted imaging of the floor of the resection cavity, favored to represent a small amount of postsurgical blood products. Negative for an acute infarct. No hydrocephalus. No extra-axial fluid collection.   There is a small amount of smooth dural contrast enhancement at the craniotomy site, favored to be postsurgical. T2/FLAIR hyperintense signal  abnormality surrounding the resection site is unchanged. No new contrast-enhancing parenchymal lesion is visualized. Previously reported contrast-enhancing lesion in the left frontal lobe is not visualized on this exam there is sequela of moderate overall chronic microvascular ischemic change.   Vascular: Normal flow voids.   Skull and upper cervical spine: Right parietal craniotomy.   Sinuses/Orbits: No middle ear or mastoid effusion. Pansinus mucosal thickening. Orbits are unremarkable.   Other: None.   IMPRESSION: 1. No acute abnormality. No new contrast-enhancing intracranial lesions visualized.   2. Postsurgical changes from a right parietal craniotomy with unchanged appearance of the resection cavity.     Electronically Signed   By: Lorenza Cambridge M.D.   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  MRI brain following seizure in May demonstrated stable findings.    Some aspects of clinical history do not fill with with epileptic event; bilateral shaking without impaired awareness, no post-ictal encephalopathy or weakness.  Nonetheless, we recommended continuing Keppra 1000mg  BID for now.  We spent twenty additional minutes teaching regarding the natural history, biology, and historical experience in the treatment of neurologic complications of cancer.   We appreciate the opportunity to participate in the care of Tabitha Ramirez.   We ask that Tabitha Ramirez return to clinic in 1 months following next brain MRI, or sooner as needed.  All questions were answered. The patient knows to call the clinic with any problems, questions or concerns. No barriers to learning were detected.  The total time spent in the encounter was 40 minutes and more than 50% was on counseling and review of test results   Henreitta Leber, MD Medical Director of  Neuro-Oncology [facility] at [facility] 04/02/23 9:02 AM

## 2023-04-03 ENCOUNTER — Observation Stay (HOSPITAL_COMMUNITY): Payer: Medicare PPO

## 2023-04-03 ENCOUNTER — Emergency Department (HOSPITAL_COMMUNITY): Payer: Medicare PPO

## 2023-04-03 DIAGNOSIS — Z853 Personal history of malignant neoplasm of breast: Secondary | ICD-10-CM | POA: Diagnosis not present

## 2023-04-03 DIAGNOSIS — Z79811 Long term (current) use of aromatase inhibitors: Secondary | ICD-10-CM | POA: Diagnosis not present

## 2023-04-03 DIAGNOSIS — Z800 Family history of malignant neoplasm of digestive organs: Secondary | ICD-10-CM | POA: Diagnosis not present

## 2023-04-03 DIAGNOSIS — E039 Hypothyroidism, unspecified: Secondary | ICD-10-CM | POA: Diagnosis present

## 2023-04-03 DIAGNOSIS — G9340 Encephalopathy, unspecified: Secondary | ICD-10-CM

## 2023-04-03 DIAGNOSIS — Z803 Family history of malignant neoplasm of breast: Secondary | ICD-10-CM | POA: Diagnosis not present

## 2023-04-03 DIAGNOSIS — Z8711 Personal history of peptic ulcer disease: Secondary | ICD-10-CM | POA: Diagnosis not present

## 2023-04-03 DIAGNOSIS — T426X5A Adverse effect of other antiepileptic and sedative-hypnotic drugs, initial encounter: Secondary | ICD-10-CM | POA: Diagnosis present

## 2023-04-03 DIAGNOSIS — R569 Unspecified convulsions: Secondary | ICD-10-CM | POA: Diagnosis present

## 2023-04-03 DIAGNOSIS — D649 Anemia, unspecified: Secondary | ICD-10-CM | POA: Diagnosis present

## 2023-04-03 DIAGNOSIS — Z880 Allergy status to penicillin: Secondary | ICD-10-CM | POA: Diagnosis not present

## 2023-04-03 DIAGNOSIS — C7931 Secondary malignant neoplasm of brain: Secondary | ICD-10-CM | POA: Diagnosis present

## 2023-04-03 DIAGNOSIS — Z79890 Hormone replacement therapy (postmenopausal): Secondary | ICD-10-CM | POA: Diagnosis not present

## 2023-04-03 DIAGNOSIS — E785 Hyperlipidemia, unspecified: Secondary | ICD-10-CM | POA: Diagnosis present

## 2023-04-03 DIAGNOSIS — G929 Unspecified toxic encephalopathy: Secondary | ICD-10-CM | POA: Diagnosis present

## 2023-04-03 DIAGNOSIS — Z886 Allergy status to analgesic agent status: Secondary | ICD-10-CM | POA: Diagnosis not present

## 2023-04-03 DIAGNOSIS — Z923 Personal history of irradiation: Secondary | ICD-10-CM | POA: Diagnosis not present

## 2023-04-03 DIAGNOSIS — G629 Polyneuropathy, unspecified: Secondary | ICD-10-CM | POA: Diagnosis present

## 2023-04-03 DIAGNOSIS — Z833 Family history of diabetes mellitus: Secondary | ICD-10-CM | POA: Diagnosis not present

## 2023-04-03 DIAGNOSIS — G40909 Epilepsy, unspecified, not intractable, without status epilepticus: Secondary | ICD-10-CM | POA: Diagnosis present

## 2023-04-03 DIAGNOSIS — Z8249 Family history of ischemic heart disease and other diseases of the circulatory system: Secondary | ICD-10-CM | POA: Diagnosis not present

## 2023-04-03 DIAGNOSIS — I10 Essential (primary) hypertension: Secondary | ICD-10-CM | POA: Diagnosis present

## 2023-04-03 DIAGNOSIS — Z90710 Acquired absence of both cervix and uterus: Secondary | ICD-10-CM | POA: Diagnosis not present

## 2023-04-03 DIAGNOSIS — Z9221 Personal history of antineoplastic chemotherapy: Secondary | ICD-10-CM | POA: Diagnosis not present

## 2023-04-03 DIAGNOSIS — Z87891 Personal history of nicotine dependence: Secondary | ICD-10-CM | POA: Diagnosis not present

## 2023-04-03 DIAGNOSIS — T424X5A Adverse effect of benzodiazepines, initial encounter: Secondary | ICD-10-CM | POA: Diagnosis present

## 2023-04-03 MED ORDER — ACETAMINOPHEN 650 MG RE SUPP: 650.0000 mg | Freq: Four times a day (QID) | RECTAL | Status: DC | PRN

## 2023-04-03 MED ORDER — ACETAMINOPHEN 325 MG PO TABS: 650.0000 mg | ORAL_TABLET | Freq: Four times a day (QID) | ORAL | Status: DC | PRN

## 2023-04-03 MED ORDER — LORAZEPAM 2 MG/ML IJ SOLN
2.0000 mg | Freq: Once | INTRAMUSCULAR | Status: DC
  Filled 2023-04-03: qty 1

## 2023-04-03 MED ORDER — LEVETIRACETAM 500 MG PO TABS
1500.0000 mg | ORAL_TABLET | Freq: Two times a day (BID) | ORAL | Status: DC
  Administered 2023-04-03 – 2023-04-05 (×5): 1500 mg via ORAL
  Filled 2023-04-03 (×5): qty 3

## 2023-04-03 NOTE — Consult Note (Signed)
Neurology Consultation  Reason for Consult: Seizures Referring Physician: Dr. Durwin Nora  CC: Seizures  History is obtained from: Chart, patient  HPI: Tabitha Ramirez is a 77 y.o. female past medical history of breast cancer metastatic to the right parietal lobe of the brain status post r resection craniotomy and radiation, hyperlipidemia, seizures on Keppra presenting to the [facility] emergency room for concern for seizure activity. She had seen her neuro oncologist Dr. Barbaraann Cao this morning, with plans to continue Keppra and repeat MRI in 1 month.  She was fine until about 4 PM when she had a seizure that lasted about 5 minutes.  The daughter describes the seizure as her becoming less responsive and shaking on the left side.  She gave her the p.o. lorazepam that had been prescribed to her for seizure abatement and the seizure stopped.  The daughter then drove her to [facility], ER.  She remained drowsy for multiple hours after the seizure that prompted Dr. Durwin Nora to initially call me.  I recommended transferring to [facility] for EEG should she continue to be lethargic.  I also recommended Ativan, additional 1 g of Keppra IV and a load of fosphenytoin, which was given at [facility] ER.  She had another seizure at [facility] ER, and was transferred ER to ER for further evaluation. Critical care was consulted given concern for status epilepticus by the admitting hospitalist team.  Last seizure was May 2024.  Seizure prior to that was about a year ago.  Review of Dr. Liana Gerold notes notes that patient had shaking of both arms for several minutes with the past seizure.  After the Keppra dose was increased to 1 g twice daily, no further seizures.  Complained of headache and received Reglan and Benadryl at the [facility] emergency department as well  ROS: Full ROS was performed and is negative except as noted in the HPI.   Past Medical History:  Diagnosis Date   Allergy    Anemia    Arthritis     "joints" (09/29/2013)   Breast cancer (HCC)    GERD (gastroesophageal reflux disease)    Heart murmur    History of blood transfusion    "related to chemo/breast cancer" (09/29/2013)   Hypertension    Migraines    Personal history of chemotherapy    Personal history of radiation therapy    Radiation 11/05/13-12/24/13   Right breast    TMJ (temporomandibular joint syndrome)    "left; just dx'd" (09/29/2013     Family History  Problem Relation Age of Onset   Cancer Mother 80       Unknown type of cancer   Congestive Heart Failure Mother    Breast cancer Mother    Colon cancer Brother    Diabetes Brother 35   Heart disease Brother        AMI 05/22/2012   Congestive Heart Failure Brother    Cancer Maternal Grandmother 77       Unknown type of cancer   Breast cancer Maternal Grandmother      Social History:   reports that she has quit smoking. Her smoking use included cigarettes. She has never used smokeless tobacco. She reports that she does not drink alcohol and does not use drugs.  Medications No current facility-administered medications for this encounter.  Current Outpatient Medications:    acetaminophen (TYLENOL) 325 MG tablet, Take 1-2 tablets (325-650 mg total) by mouth every 6 (six) hours as needed for mild  pain., Disp: , Rfl:    anastrozole (ARIMIDEX) 1 MG tablet, Take 1 tablet (1 mg total) by mouth daily., Disp: 90 tablet, Rfl: 3   Cholecalciferol (D3-1000) 25 MCG (1000 UT) capsule, Take 1 capsule (1,000 Units total) by mouth daily., Disp: 30 capsule, Rfl: 0   ferrous sulfate 325 (65 FE) MG tablet, Take 1 tablet (325 mg total) by mouth daily with breakfast., Disp: 30 tablet, Rfl: 3   levETIRAcetam (KEPPRA) 1000 MG tablet, Take 1 tablet (1,000 mg total) by mouth 2 (two) times daily., Disp: 180 tablet, Rfl: 3   levothyroxine (SYNTHROID) 50 MCG tablet, Take 1 tablet (50 mcg total) by mouth daily., Disp: 30 tablet, Rfl: 0   LORazepam (ATIVAN) 0.5 MG tablet, Take 1 tablet as  needed for seizure. Do not take more than 3 a week., Disp: 10 tablet, Rfl: 5   oxyCODONE-acetaminophen (PERCOCET/ROXICET) 5-325 MG tablet, Take 1 tablet by mouth every 8 (eight) hours as needed for severe pain., Disp: 90 tablet, Rfl: 0   potassium chloride (KLOR-CON) 10 MEQ tablet, Take 10 mEq by mouth daily., Disp: , Rfl:    rosuvastatin (CRESTOR) 10 MG tablet, Take 1 tablet (10 mg total) by mouth daily., Disp: 30 tablet, Rfl: 0   vitamin B-12 (CYANOCOBALAMIN) 1000 MCG tablet, Take 1 tablet (1,000 mcg total) by mouth daily., Disp: 30 tablet, Rfl: 0   Exam: Current vital signs: BP (!) 111/53   Pulse 91   Temp 97.9 F (36.6 C) (Oral)   Resp (!) 21   Ht 5\' 3"  (1.6 m)   Wt 61 kg   SpO2 100%   BMI 23.82 kg/m  Vital signs in last 24 hours: Temp:  [97.3 F (36.3 C)-98.4 F (36.9 C)] 97.9 F (36.6 C) (07/08 2237) Pulse Rate:  [58-117] 91 (07/08 2330) Resp:  [18-31] 21 (07/08 2330) BP: (108-159)/(53-86) 111/53 (07/08 2330) SpO2:  [97 %-100 %] 100 % (07/08 2330) Weight:  [61 kg-61.1 kg] 61 kg (07/08 1817) General: Drowsy in no acute distress HEENT: Normocephalic atraumatic Lungs: Clear Cardiovascular: Regular rhythm Abdomen nondistended nontender Neurological exam Drowsy.  Opens eyes to voice.  Follows commands. Poor attention concentration Dysarthric speech No evidence of aphasia Cranial nerves: Pupils equal round react light, extraocular movements appear unhindered although she does appear to have mild right-sided gaze preference.  She does not blink to threat from either side consistently and keeps falling asleep during the encounter.  There is no facial asymmetry. She is able to raise both her upper and lower extremities antigravity with subtle left-sided weakness which is baseline per daughter. Sensation intact Coordination difficult to assess given her level of consciousness at this time.   Labs I have reviewed labs in epic and the results pertinent to this consultation  are: CBC    Component Value Date/Time   WBC 8.8 04/02/2023 2133   RBC 3.25 (L) 04/02/2023 2133   HGB 10.7 (L) 04/02/2023 2133   HGB 10.7 (L) 06/22/2014 1107   HCT 32.4 (L) 04/02/2023 2133   HCT 32.5 (L) 06/22/2014 1107   PLT 249 04/02/2023 2133   PLT 284 06/22/2014 1107   MCV 99.7 04/02/2023 2133   MCV 102.2 (H) 06/22/2014 1107   MCH 32.9 04/02/2023 2133   MCHC 33.0 04/02/2023 2133   RDW 13.1 04/02/2023 2133   RDW 14.7 (H) 06/22/2014 1107   LYMPHSABS 3.6 04/02/2023 2133   LYMPHSABS 2.4 06/22/2014 1107   MONOABS 0.8 04/02/2023 2133   MONOABS 0.7 06/22/2014 1107   EOSABS 0.7 (  H) 04/02/2023 2133   EOSABS 0.2 06/22/2014 1107   BASOSABS 0.1 04/02/2023 2133   BASOSABS 0.0 06/22/2014 1107    CMP     Component Value Date/Time   NA 139 04/02/2023 2133   NA 140 03/30/2015 0944   K 3.7 04/02/2023 2133   K 3.8 03/30/2015 0944   CL 103 04/02/2023 2133   CL 100 03/13/2013 0918   CO2 28 04/02/2023 2133   CO2 26 03/30/2015 0944   GLUCOSE 86 04/02/2023 2133   GLUCOSE 103 03/30/2015 0944   GLUCOSE 169 (H) 03/13/2013 0918   BUN 21 04/02/2023 2133   BUN 18.9 03/30/2015 0944   CREATININE 0.84 04/02/2023 2133   CREATININE 0.9 03/30/2015 0944   CALCIUM 9.5 04/02/2023 2133   CALCIUM 9.7 03/30/2015 0944   PROT 8.1 04/02/2023 2133   PROT 7.9 03/30/2015 0944   ALBUMIN 3.9 04/02/2023 2133   ALBUMIN 3.1 (L) 03/30/2015 0944   AST 29 04/02/2023 2133   AST 20 03/30/2015 0944   ALT 35 04/02/2023 2133   ALT 19 03/30/2015 0944   ALKPHOS 84 04/02/2023 2133   ALKPHOS 100 03/30/2015 0944   BILITOT 0.5 04/02/2023 2133   BILITOT 0.45 03/30/2015 0944   GFRNONAA >60 04/02/2023 2133   GFRNONAA 64 11/29/2011 1535   GFRAA 85 (L) 05/12/2014 1238   GFRAA 74 11/29/2011 1535    Lipid Panel     Component Value Date/Time   CHOL 164 11/29/2011 1535   TRIG 170 (H) 11/29/2011 1535   HDL 43 11/29/2011 1535   CHOLHDL 3.8 11/29/2011 1535   VLDL 34 11/29/2011 1535   LDLCALC 87 11/29/2011 1535     Lab Results  Component Value Date   HGBA1C 7.1 (H) 04/27/2013    EEG from May 2024 with cortical dysfunction from right central parietal region likely secondary to underlying abnormality.  No seizures seen during that recording.  Imaging I have reviewed the images obtained:  CT-head: Compared to May 2024-unchanged appearance of the noncontrasted head CT with stable changes from the resection of the right parietal tumor and craniotomy.  Assessment:  77 year old with history of breast cancer metastatic to the right parietal brain status post resection of the right parietal met and radiation, history of seizures with multiple seizures today-1 seizure at home after which she remained prolonged postictal and another seizure at [facility] hospital necessitating transfer to [facility] for neurological consultation and further evaluation. At this time, she was dysarthric in her speech, drowsy.  Also received Keppra fosphenytoin and benzos. Clinically she does not appear to be in status epilepticus but her not being back to baseline is definitely out of character for seizures after this long duration of time. 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  Also needs evaluation for progression of metastatic process.  Impression:  Breakthrough seizures in the setting of known brain mets from breast cancer Evaluate for new brain mets versus progression of old mets  Recommendations: Admit to progressive unit-hospitalist service. Seizure precautions Already received Keppra 1 g IV followed by another 1 g IV dose at [facility].  This was followed by Ativan 2 mg IV x 1 and fosphenytoin load of 20 mg/kg phenytoin equivalents. Increase Keppra to 1500 mg twice daily.  If she is able to take p.o. that is fine otherwise change to IV Keppra 1500 mg twice daily for now. I would obtain an MRI of the brain with and without contrast first. After the MRI is obtained, I would  recommend  getting routine EEG. If the routine EEG is concerning for ongoing seizure activity, we will consider LTM EEG. Plan was discussed with Dr. Manus Gunning from ER and Dr. Judeth Horn from [facility] to continue to follow.  -- Milon Dikes, MD Neurologist [facility] Pager: [phone]

## 2023-04-03 NOTE — Progress Notes (Signed)
EEG complete - results pending 

## 2023-04-03 NOTE — Progress Notes (Signed)
Patient moved from ED to 5W. [facility] monitoring now that she is in the room. Test button was tested.

## 2023-04-03 NOTE — ED Notes (Signed)
ED TO INPATIENT HANDOFF REPORT  ED Nurse Name and Phone #: [phone]  S Name/Age/Gender Tabitha Ramirez 77 y.o. female Room/Bed: 011C/011C  Code Status   Code Status: Full Code  Home/SNF/Other Home Patient oriented to: self, place, time, and situation Is this baseline? Yes   Triage Complete: Triage complete  Chief Complaint Seizures (HCC) [R56.9]  Triage Note Pt with hx of brain tumor and on radiation. Pt takes keppra 1000 mg BID, next dose tonight 9 pm. Last seizure in May before today. Daughter states they left oncologist today and then pt had seizure at home, did not fall. Pt was given 0.5 mg ativan for seizure around 1630.  Pt states it feels like her left arm is heavy.   Pt BIB [facility] as ED to ED transfer from [facility] for neuro consult.   Allergies Allergies  Allergen Reactions   Nsaids Nausea And Vomiting and Other (See Comments)    Pt states stomach ulcers, vomiting, and bleeding.   Penicillins Rash    Other reaction(s): Face swelling    Level of Care/Admitting Diagnosis ED Disposition     ED Disposition  Admit   Condition  --   Comment  Hospital Area: [facility] [[id number]]  Level of Care: Progressive [102]  Admit to Progressive based on following criteria: NEUROLOGICAL AND NEUROSURGICAL complex patients with significant risk of instability, who do not meet ICU criteria, yet require close observation or frequent assessment (< / = every 2 - 4 hours) with medical / nursing intervention.  May admit patient to [facility] or [facility] if equivalent level of care is available:: Yes  Covid Evaluation: Asymptomatic - no recent exposure (last 10 days) testing not required  Diagnosis: Seizures [facility]) [[id number]]  Admitting Physician: John Giovanni [[id number]]  Attending Physician: RAI, RIPUDEEP K [[id number]]  Certification:: I certify this patient will need inpatient services for at least 2 midnights  Estimated Length of Stay: 2           B Medical/Surgery History Past Medical History:  Diagnosis Date   Allergy    Anemia    Arthritis    "joints" (09/29/2013)   Breast cancer (HCC)    GERD (gastroesophageal reflux disease)    Heart murmur    History of blood transfusion    "related to chemo/breast cancer" (09/29/2013)   Hypertension    Migraines    Personal history of chemotherapy    Personal history of radiation therapy    Radiation 11/05/13-12/24/13   Right breast    TMJ (temporomandibular joint syndrome)    "left; just dx'd" (09/29/2013   Past Surgical History:  Procedure Laterality Date   AXILLARY LYMPH NODE BIOPSY Right 03/10/2013   Procedure: AXILLARY LYMPH NODE BIOPSY;  Surgeon: Ernestene Mention, MD;  Location: [facility] OR;  Service: General;  Laterality: Right;  sentinel node with blue dye   BREAST BIOPSY     BREAST LUMPECTOMY Right 09/29/2013   needle localization w/axillary LND/notes 09/29/2013   BREAST LUMPECTOMY WITH NEEDLE LOCALIZATION AND AXILLARY LYMPH NODE DISSECTION Right 09/29/2013   Procedure: RIGHT BREAST NEEDLE LOCALIZED LUMPECTOMY AND AXILLARY LYMPH NODE DISSECTION;  Surgeon: Ernestene Mention, MD;  Location: [facility] OR;  Service: General;  Laterality: Right;   COLONOSCOPY N/A 04/30/2013   Procedure: COLONOSCOPY;  Surgeon: Graylin Shiver, MD;  Location: [facility] ENDOSCOPY;  Service: Endoscopy;  Laterality: N/A;   CRANIOTOMY Right 03/11/2022   Procedure: CRANIOTOMY TUMOR EXCISION;  Surgeon: Venetia Night, MD;  Location: [facility] ORS;  Service:  Neurosurgery;  Laterality: Right;   ESOPHAGOGASTRODUODENOSCOPY N/A 04/28/2013   Procedure: ESOPHAGOGASTRODUODENOSCOPY (EGD);  Surgeon: Graylin Shiver, MD;  Location: [facility];  Service: Endoscopy;  Laterality: N/A;   MASTECTOMY Right 09/29/2013   "partial"   PORT-A-CATH REMOVAL  09/29/2013   PORT-A-CATH REMOVAL Left 09/29/2013   Procedure: REMOVAL PORT-A-CATH;  Surgeon: Ernestene Mention, MD;  Location: [facility] OR;  Service: General;  Laterality: Left;   PORTACATH PLACEMENT N/A 03/10/2013    Procedure: INSERTION PORT-A-CATH WITH FLUOROSCOPY AND ULTRASOUND;  Surgeon: Ernestene Mention, MD;  Location: [facility] OR;  Service: General;  Laterality: N/A;   SURAL NERVE BX Right 05/13/2014   Procedure: SURAL NERVE BIOPSY;  Surgeon: Hewitt Shorts, MD;  Location: [facility] NEURO ORS;  Service: Neurosurgery;  Laterality: Right;  Right sural nerve biopsy   TONSILLECTOMY     TOTAL ABDOMINAL HYSTERECTOMY     With bilateral salpingo-oophorectomy     A IV Location/Drains/Wounds Patient Lines/Drains/Airways Status     Active Line/Drains/Airways     Name Placement date Placement time Site Days   Peripheral IV 04/02/23 20 G 2.5" Left Antecubital 04/02/23  2130  Antecubital  1            Intake/Output Last 24 hours  Intake/Output Summary (Last 24 hours) at 04/03/2023 1454 Last data filed at 04/02/2023 2330 Gross per 24 hour  Intake 1323.35 ml  Output --  Net 1323.35 ml    Labs/Imaging Results for orders placed or performed during the hospital encounter of 04/02/23 (from the past 48 hour(s))  CBG monitoring, ED     Status: None   Collection Time: 04/02/23  6:10 PM  Result Value Ref Range   Glucose-Capillary 75 70 - 99 mg/dL    Comment: Glucose reference range applies only to samples taken after fasting for at least 8 hours.  Urinalysis, Routine w reflex microscopic -Urine, Clean Catch     Status: Abnormal   Collection Time: 04/02/23  7:39 PM  Result Value Ref Range   Color, Urine STRAW (A) YELLOW   APPearance CLEAR CLEAR   Specific Gravity, Urine 1.011 1.005 - 1.030   pH 7.0 5.0 - 8.0   Glucose, UA NEGATIVE NEGATIVE mg/dL   Hgb urine dipstick NEGATIVE NEGATIVE   Bilirubin Urine NEGATIVE NEGATIVE   Ketones, ur NEGATIVE NEGATIVE mg/dL   Protein, ur NEGATIVE NEGATIVE mg/dL   Nitrite NEGATIVE NEGATIVE   Leukocytes,Ua NEGATIVE NEGATIVE    Comment: Performed at [facility], [address] [place], [place] [address]  Comprehensive metabolic panel     Status: None    Collection Time: 04/02/23  9:33 PM  Result Value Ref Range   Sodium 139 135 - 145 mmol/L   Potassium 3.7 3.5 - 5.1 mmol/L   Chloride 103 98 - 111 mmol/L   CO2 28 22 - 32 mmol/L   Glucose, Bld 86 70 - 99 mg/dL    Comment: Glucose reference range applies only to samples taken after fasting for at least 8 hours.   BUN 21 8 - 23 mg/dL   Creatinine, Ser 6.04 0.44 - 1.00 mg/dL   Calcium 9.5 8.9 - 54.0 mg/dL   Total Protein 8.1 6.5 - 8.1 g/dL   Albumin 3.9 3.5 - 5.0 g/dL   AST 29 15 - 41 U/L   ALT 35 0 - 44 U/L   Alkaline Phosphatase 84 38 - 126 U/L   Total Bilirubin 0.5 0.3 - 1.2 mg/dL   GFR, Estimated >98 >11 mL/min  Comment: (NOTE) Calculated using the CKD-EPI Creatinine Equation (2021)    Anion gap 8 5 - 15    Comment: Performed at [facility], [address], [place], [place] [address]  CBC with Differential/Platelet     Status: Abnormal   Collection Time: 04/02/23  9:33 PM  Result Value Ref Range   WBC 8.8 4.0 - 10.5 K/uL   RBC 3.25 (L) 3.87 - 5.11 MIL/uL   Hemoglobin 10.7 (L) 12.0 - 15.0 g/dL   HCT 60.4 (L) 54.0 - 98.1 %   MCV 99.7 80.0 - 100.0 fL   MCH 32.9 26.0 - 34.0 pg   MCHC 33.0 30.0 - 36.0 g/dL   RDW 19.1 47.8 - 29.5 %   Platelets 249 150 - 400 K/uL   nRBC 0.0 0.0 - 0.2 %   Neutrophils Relative % 41 %   Neutro Abs 3.6 1.7 - 7.7 K/uL   Lymphocytes Relative 41 %   Lymphs Abs 3.6 0.7 - 4.0 K/uL   Monocytes Relative 9 %   Monocytes Absolute 0.8 0.1 - 1.0 K/uL   Eosinophils Relative 8 %   Eosinophils Absolute 0.7 (H) 0.0 - 0.5 K/uL   Basophils Relative 1 %   Basophils Absolute 0.1 0.0 - 0.1 K/uL   Immature Granulocytes 0 %   Abs Immature Granulocytes 0.01 0.00 - 0.07 K/uL    Comment: Performed at [facility], [address], [place], [place] [address]  Magnesium     Status: None   Collection Time: 04/02/23  9:33 PM  Result Value Ref Range   Magnesium 1.8 1.7 - 2.4 mg/dL    Comment: Performed at [facility] 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 EEG to evaluate for seizure Level of alertness: awake, sleep AEDs during EEG study: LEV, Ativan, fosphenytoin Technical aspects: This EEG study was done with scalp electrodes positioned according to the 10-20 International system of electrode placement. Electrical activity was reviewed with band pass filter of 1-70Hz , sensitivity of 7 uV/mm, display speed of 45mm/sec with a 60Hz  notched filter applied as appropriate. EEG data were recorded continuously and digitally stored.  Video monitoring was available and reviewed as appropriate. Description: The posterior dominant rhythm consists of 8 Hz activity of moderate voltage (25-35 uV) seen predominantly in posterior head regions, symmetric and reactive to eye opening and eye closing. Sleep was characterized by vertex waves, sleep spindles (12 to 14 Hz), maximal frontocentral region.  There is an excessive amount of 15 to 18 Hz beta activity distributed symmetrically and diffusely.  EEG showed continuous polymorphic sharply contoured 3-6 Esti Deppen delta slowing admixed with sharp waves in right parietal region consistent with breach artifact. This pattern is waxing and waning in morphology and frequency, lasting 3 to 7 seconds consistent with brief ictal-interictal rhythmic discharges.  Hyperventilation and photic stimulation were not performed.   ABNORMALITY -Brief ictal-interictal rhythmic discharges, right parietal region - Breach artifact, right parietal  region - Excessive beta, generalized IMPRESSION: This study showed evidence of epileptogenicity arising from right parietal region and is  on the ictal-interictal continuum.  The waxing and waning nature of the pattern with overriding rhythmicity makes it more suspicious for ictal nature. Patient there is evidence of cortical dysfunction in right parietal region consistent with underlying craniotomy.  Excessive beta activity is a benign EEG pattern and is most likely secondary to benzodiazepine use Dr. Otelia Limes was notified. [name]   CT HEAD WO CONTRAST  Result Date: 04/02/2023 CLINICAL DATA:  Metastatic disease EXAM: CT HEAD WITHOUT CONTRAST TECHNIQUE: Contiguous axial images were obtained from the base of the skull through the vertex without intravenous contrast. RADIATION DOSE REDUCTION: This exam was performed according to the departmental dose-optimization program which includes automated exposure control, adjustment of the mA and/or kV according to patient size and/or use of iterative reconstruction technique. COMPARISON:  MRI head 02/12/2023.  CT head 02/10/2023 FINDINGS: Brain: Right parietal craniotomy with underlying focal rounded area of hypodensity appears similar to the prior study. Surrounding white matter hypodensity in this region is unchanged. There is no acute intracranial hemorrhage, mass effect or midline shift. There is stable mild periventricular white matter hypodensity, likely chronic small vessel ischemic change. Vascular: Atherosclerotic calcifications are present within the cavernous internal carotid arteries. Skull: No acute fractures. Sinuses/Orbits: No acute finding. Other: None. IMPRESSION: 1. No acute intracranial process. 2. Right parietal craniotomy with underlying resection site appears similar to the prior study. Electronically Signed   By: Darliss Cheney M.D.   On: 04/02/2023 20:22    Pending Labs Unresulted Labs (From admission, onward)     Start     Ordered    04/02/23 1929  Keppra (Levetiracetam) level  Once,   URGENT        04/02/23 1929            Vitals/Pain Today's Vitals   04/03/23 1145 04/03/23 1215 04/03/23 1315 04/03/23 1345  BP: 123/67 126/75 118/62 (!) 131/58  Pulse: 74 77 80 79  Resp: 17 20 (!) 23 19  Temp:      TempSrc:      SpO2: 100% 100% 100% 100%  Weight:      Height:      PainSc:        Isolation Precautions No active isolations  Medications Medications  levETIRAcetam (KEPPRA) tablet 1,500 mg (1,500 mg Oral Given 04/03/23 0921)  acetaminophen (TYLENOL) tablet 650 mg (has no administration in time range)    Or  acetaminophen (TYLENOL) suppository 650 mg (has no administration in time range)  levETIRAcetam (KEPPRA) IVPB 1000 mg/100 mL premix (0 mg Intravenous Stopped 04/02/23 2226)  metoCLOPramide (REGLAN) injection 10 mg (10 mg Intravenous Given 04/02/23 2153)  diphenhydrAMINE (BENADRYL) injection 12.5 mg (12.5 mg Intravenous Given 04/02/23 2153)  lactated ringers bolus 1,000 mL (0 mLs Intravenous Stopped 04/02/23 2247)  acetaminophen (TYLENOL) tablet 650 mg (650 mg Oral Given 04/02/23 2154)  LORazepam (ATIVAN) injection 1 mg (1 mg Intravenous Given 04/02/23 2231)  levETIRAcetam (KEPPRA) IVPB 1000 mg/100 mL premix (0 mg Intravenous Stopped 04/02/23 2247)  fosPHENYtoin (CEREBYX) 1,220 mg PE in sodium chloride 0.9 % 50 mL IVPB (0 mg PE Intravenous Stopped 04/02/23 2330)    Mobility non-ambulatory     Focused Assessments Neuro Assessment Handoff:  Swallow screen pass? Yes  Cardiac Rhythm: Normal sinus rhythm NIH Stroke Scale  Dizziness Present: No Headache Present: Yes Interval: Initial Level of Consciousness (1a.)   : Alert, keenly responsive LOC Questions (1b. )   : Answers both questions correctly LOC Commands (1c. )   : Performs both  tasks correctly Best Gaze (2. )  : Normal Visual (3. )  : No visual loss Facial Palsy (4. )    : Normal symmetrical movements Motor Arm, Left (5a. )   : No drift Motor Arm, Right  (5b. ) : No drift Motor Leg, Left (6a. )  : No drift Motor Leg, Right (6b. ) : No drift Limb Ataxia (7. ): Absent Sensory (8. )  : Normal, no sensory loss Best Language (9. )  : No aphasia Dysarthria (10. ): Normal Extinction/Inattention (11.)   : No Abnormality Complete NIHSS TOTAL: 0     Neuro Assessment: Exceptions to WDL Neuro Checks:   Initial (04/02/23 1930)  Has TPA been given? No If patient is a Neuro Trauma and patient is going to OR before floor call report to 4N Charge nurse: [phone] or [phone]   R Recommendations: See Admitting Provider Note  Report given to:   Additional Notes:

## 2023-04-03 NOTE — H&P (Signed)
History and Physical    Tabitha Ramirez:[id number] DOB: 03-23-1946 DOA: 04/02/2023  PCP: [facility]  Patient coming from: Home  Chief Complaint: Seizure  HPI: Tabitha Ramirez is a 77 y.o. female with medical history significant of breast cancer metastatic to the right parietal lobe of the brain status post resection craniotomy and radiation, seizures on Keppra, hyperlipidemia, hypothyroidism, polyneuropathy, anemia presented to [facility] ED after seizure at home.  Patient was seen by her neuro-oncologist this morning with plans to continue Keppra and repeat MRI in 1 month.  She was doing okay until 4 PM and then had a witnessed seizure which lasted about 5 minutes and daughter drove her to [facility] ED. Patient was awake and alert on arrival to the ED.  Afebrile.  Labs showing no leukocytosis, hemoglobin 10.7 (at baseline), UA not suggestive of infection, magnesium within normal range.  CT head negative for acute intracranial process.  Patient was given Tylenol, Benadryl, and Reglan for headache.  She was also given 1 L LR bolus.  Neurology was consulted and she was given Ativan, loading dose IV Keppra, and a load of fosphenytoin.  However, she had another seizure at [facility] and was transferred to [facility] ED for neurology evaluation given concern for status epilepticus.  Upon arrival to [facility] ED, patient was dysarthric in her speech, drowsy.  Neurology felt that her presentation was suspicious for electrographic status epilepticus.  Recommending increasing the dose of her home Keppra and obtaining brain MRI and routine EEG.  Critical care also evaluated the patient and felt that she did not need ICU admission.  Patient is currently somnolent but easily able to wake up, stay awake, and answer questions.  States she had a seizure at home which was witnessed by her daughter.  She is taking Keppra 1000 mg twice daily and has not missed any doses.  She thinks her last  seizure was a month ago.  Denies fevers, cough, shortness of breath, chest pain, abdominal pain, vomiting, or diarrhea.  Review of Systems:  Review of Systems  All other systems reviewed and are negative.   Past Medical History:  Diagnosis Date   Allergy    Anemia    Arthritis    "joints" (09/29/2013)   Breast cancer (HCC)    GERD (gastroesophageal reflux disease)    Heart murmur    History of blood transfusion    "related to chemo/breast cancer" (09/29/2013)   Hypertension    Migraines    Personal history of chemotherapy    Personal history of radiation therapy    Radiation 11/05/13-12/24/13   Right breast    TMJ (temporomandibular joint syndrome)    "left; just dx'd" (09/29/2013    Past Surgical History:  Procedure Laterality Date   AXILLARY LYMPH NODE BIOPSY Right 03/10/2013   Procedure: AXILLARY LYMPH NODE BIOPSY;  Surgeon: Ernestene Mention, MD;  Location: [facility] OR;  Service: General;  Laterality: Right;  sentinel node with blue dye   BREAST BIOPSY     BREAST LUMPECTOMY Right 09/29/2013   needle localization w/axillary LND/notes 09/29/2013   BREAST LUMPECTOMY WITH NEEDLE LOCALIZATION AND AXILLARY LYMPH NODE DISSECTION Right 09/29/2013   Procedure: RIGHT BREAST NEEDLE LOCALIZED LUMPECTOMY AND AXILLARY LYMPH NODE DISSECTION;  Surgeon: Ernestene Mention, MD;  Location: [facility] OR;  Service: General;  Laterality: Right;   COLONOSCOPY N/A 04/30/2013   Procedure: COLONOSCOPY;  Surgeon: Graylin Shiver, MD;  Location: [facility] ENDOSCOPY;  Service: Endoscopy;  Laterality: N/A;   CRANIOTOMY Right 03/11/2022   Procedure: CRANIOTOMY TUMOR EXCISION;  Surgeon: Venetia Night, MD;  Location: [facility] ORS;  Service: Neurosurgery;  Laterality: Right;   ESOPHAGOGASTRODUODENOSCOPY N/A 04/28/2013   Procedure: ESOPHAGOGASTRODUODENOSCOPY (EGD);  Surgeon: Graylin Shiver, MD;  Location: [facility];  Service: Endoscopy;  Laterality: N/A;   MASTECTOMY Right 09/29/2013   "partial"   PORT-A-CATH REMOVAL  09/29/2013   PORT-A-CATH  REMOVAL Left 09/29/2013   Procedure: REMOVAL PORT-A-CATH;  Surgeon: Ernestene Mention, MD;  Location: [facility] OR;  Service: General;  Laterality: Left;   PORTACATH PLACEMENT N/A 03/10/2013   Procedure: INSERTION PORT-A-CATH WITH FLUOROSCOPY AND ULTRASOUND;  Surgeon: Ernestene Mention, MD;  Location: [facility] OR;  Service: General;  Laterality: N/A;   SURAL NERVE BX Right 05/13/2014   Procedure: SURAL NERVE BIOPSY;  Surgeon: Hewitt Shorts, MD;  Location: [facility] NEURO ORS;  Service: Neurosurgery;  Laterality: Right;  Right sural nerve biopsy   TONSILLECTOMY     TOTAL ABDOMINAL HYSTERECTOMY     With bilateral salpingo-oophorectomy     reports that she has quit smoking. Her smoking use included cigarettes. She has never used smokeless tobacco. She reports that she does not drink alcohol and does not use drugs.  Allergies  Allergen Reactions   Nsaids Nausea And Vomiting and Other (See Comments)    Pt states stomach ulcers, vomiting, and bleeding.   Penicillins Rash    Other reaction(s): Face swelling    Family History  Problem Relation Age of Onset   Cancer Mother 79       Unknown type of cancer   Congestive Heart Failure Mother    Breast cancer Mother    Colon cancer Brother    Diabetes Brother 36   Heart disease Brother        AMI 05/22/2012   Congestive Heart Failure Brother    Cancer Maternal Grandmother 88       Unknown type of cancer   Breast cancer Maternal Grandmother     Prior to Admission medications   Medication Sig Start Date End Date Taking? Authorizing Provider  acetaminophen (TYLENOL) 325 MG tablet Take 1-2 tablets (325-650 mg total) by mouth every 6 (six) hours as needed for mild pain. 02/13/23   Elgergawy, Leana Roe, MD  anastrozole (ARIMIDEX) 1 MG tablet Take 1 tablet (1 mg total) by mouth daily. 04/04/22   Serena Croissant, MD  Cholecalciferol (D3-1000) 25 MCG (1000 UT) capsule Take 1 capsule (1,000 Units total) by mouth daily. 03/27/22   Angiulli, Mcarthur Rossetti, PA-C  ferrous sulfate 325  (65 FE) MG tablet Take 1 tablet (325 mg total) by mouth daily with breakfast. 03/27/22   Angiulli, Mcarthur Rossetti, PA-C  levETIRAcetam (KEPPRA) 1000 MG tablet Take 1 tablet (1,000 mg total) by mouth 2 (two) times daily. 02/21/23   Van Clines, MD  levothyroxine (SYNTHROID) 50 MCG tablet Take 1 tablet (50 mcg total) by mouth daily. 04/26/22   Jones Bales, NP  LORazepam (ATIVAN) 0.5 MG tablet Take 1 tablet as needed for seizure. Do not take more than 3 a week. 02/21/23   Van Clines, MD  oxyCODONE-acetaminophen (PERCOCET/ROXICET) 5-325 MG tablet Take 1 tablet by mouth every 8 (eight) hours as needed for severe pain. 03/07/23   Pickenpack-Cousar, Arty Baumgartner, NP  potassium chloride (KLOR-CON) 10 MEQ tablet Take 10 mEq by mouth daily. 03/10/22   [provider]  rosuvastatin (CRESTOR) 10 MG tablet Take 1 tablet (10 mg total) by mouth daily. 04/26/22  Jones Bales, NP  vitamin B-12 (CYANOCOBALAMIN) 1000 MCG tablet Take 1 tablet (1,000 mcg total) by mouth daily. 03/27/22   Charlton Amor, PA-C    Physical Exam: Vitals:   04/02/23 2300 04/02/23 2315 04/02/23 2330 04/03/23 0306  BP: 119/60 108/83 (!) 111/53   Pulse: 86 93 91   Resp: (!) 24 (!) 27 (!) 21   Temp:    97.9 F (36.6 C)  TempSrc:    Oral  SpO2: 97% 98% 100%   Weight:      Height:        Physical Exam Vitals reviewed.  Constitutional:      General: She is not in acute distress. HENT:     Head: Normocephalic and atraumatic.  Eyes:     Extraocular Movements: Extraocular movements intact.  Cardiovascular:     Rate and Rhythm: Normal rate and regular rhythm.     Pulses: Normal pulses.  Pulmonary:     Effort: Pulmonary effort is normal. No respiratory distress.     Breath sounds: Normal breath sounds. No wheezing or rales.  Abdominal:     General: Bowel sounds are normal. There is no distension.     Palpations: Abdomen is soft.     Tenderness: There is no abdominal tenderness.  Musculoskeletal:     Cervical back:  Normal range of motion.     Right lower leg: No edema.     Left lower leg: No edema.  Skin:    General: Skin is warm and dry.  Neurological:     General: No focal deficit present.     Mental Status: She is alert and oriented to person, place, and time.     Labs on Admission: I have personally reviewed following labs and imaging studies  CBC: Recent Labs  Lab 04/02/23 2133  WBC 8.8  NEUTROABS 3.6  HGB 10.7*  HCT 32.4*  MCV 99.7  PLT 249   Basic Metabolic Panel: Recent Labs  Lab 04/02/23 2133  NA 139  K 3.7  CL 103  CO2 28  GLUCOSE 86  BUN 21  CREATININE 0.84  CALCIUM 9.5  MG 1.8   GFR: Estimated Creatinine Clearance: 47.9 mL/min (by C-G formula based on SCr of 0.84 mg/dL). Liver Function Tests: Recent Labs  Lab 04/02/23 2133  AST 29  ALT 35  ALKPHOS 84  BILITOT 0.5  PROT 8.1  ALBUMIN 3.9   No results for input(s): "LIPASE", "AMYLASE" in the last 168 hours. No results for input(s): "AMMONIA" in the last 168 hours. Coagulation Profile: No results for input(s): "INR", "PROTIME" in the last 168 hours. Cardiac Enzymes: No results for input(s): "CKTOTAL", "CKMB", "CKMBINDEX", "TROPONINI" in the last 168 hours. BNP (last 3 results) No results for input(s): "PROBNP" in the last 8760 hours. HbA1C: No results for input(s): "HGBA1C" in the last 72 hours. CBG: Recent Labs  Lab 04/02/23 1810  GLUCAP 75   Lipid Profile: No results for input(s): "CHOL", "HDL", "LDLCALC", "TRIG", "CHOLHDL", "LDLDIRECT" in the last 72 hours. Thyroid Function Tests: No results for input(s): "TSH", "T4TOTAL", "FREET4", "T3FREE", "THYROIDAB" in the last 72 hours. Anemia Panel: No results for input(s): "VITAMINB12", "FOLATE", "FERRITIN", "TIBC", "IRON", "RETICCTPCT" in the last 72 hours. Urine analysis:    Component Value Date/Time   COLORURINE STRAW (A) 04/02/2023 1939   APPEARANCEUR CLEAR 04/02/2023 1939   LABSPEC 1.011 04/02/2023 1939   LABSPEC 1.025 12/23/2013 1120    PHURINE 7.0 04/02/2023 1939   GLUCOSEU NEGATIVE 04/02/2023 1939  GLUCOSEU Negative 12/23/2013 1120   HGBUR NEGATIVE 04/02/2023 1939   BILIRUBINUR NEGATIVE 04/02/2023 1939   BILIRUBINUR Negative 12/23/2013 1120   KETONESUR NEGATIVE 04/02/2023 1939   PROTEINUR NEGATIVE 04/02/2023 1939   UROBILINOGEN 0.2 12/23/2013 1120   NITRITE NEGATIVE 04/02/2023 1939   LEUKOCYTESUR NEGATIVE 04/02/2023 1939   LEUKOCYTESUR Small 12/23/2013 1120    Radiological Exams on Admission: CT HEAD WO CONTRAST  Result Date: 04/02/2023 CLINICAL DATA:  Metastatic disease EXAM: CT HEAD WITHOUT CONTRAST TECHNIQUE: Contiguous axial images were obtained from the base of the skull through the vertex without intravenous contrast. RADIATION DOSE REDUCTION: This exam was performed according to the departmental dose-optimization program which includes automated exposure control, adjustment of the mA and/or kV according to patient size and/or use of iterative reconstruction technique. COMPARISON:  MRI head 02/12/2023.  CT head 02/10/2023 FINDINGS: Brain: Right parietal craniotomy with underlying focal rounded area of hypodensity appears similar to the prior study. Surrounding white matter hypodensity in this region is unchanged. There is no acute intracranial hemorrhage, mass effect or midline shift. There is stable mild periventricular white matter hypodensity, likely chronic small vessel ischemic change. Vascular: Atherosclerotic calcifications are present within the cavernous internal carotid arteries. Skull: No acute fractures. Sinuses/Orbits: No acute finding. Other: None. IMPRESSION: 1. No acute intracranial process. 2. Right parietal craniotomy with underlying resection site appears similar to the prior study. Electronically Signed   By: Darliss Cheney M.D.   On: 04/02/2023 20:22    EKG: Independently reviewed.  Sinus rhythm, RBBB.  No significant change since prior tracing.  Assessment and Plan  Breakthrough seizures in  setting of known brain mets from breast cancer -CT head negative for acute intracranial process.   -Evaluated by neurology and critical care, felt stable for progressive care unit.  Patient is currently somnolent but easily able to wake up, stay awake, and answer questions.   -Seizure precautions -Already received loading dose of IV Keppra and fosphenytoin in the ED.  Neurology recommending increasing the dose of her home Keppra to 1500 mg twice daily. -Brain MRI with and without contrast to evaluate for new brain mass versus progression of old mets -Neurology recommending routine EEG at this time and if this shows ongoing seizure activity, then will consider LTM EEG  Metastatic breast cancer Follows with heme-onc and radiation oncology.  Breast cancer treated with chemo and radiation in 2014.  Brain mets discovered in June 2023 and underwent craniotomy and resection of right parietal lobe mass on 03/11/2022.  Status post [facility] 05/03/2022.  No evidence of metastatic disease on CT chest/abdomen/pelvis in December 2023.  Brain MRI ordered at this time due to breakthrough seizures.  Hyperlipidemia Hypothyroidism Continue home medications after pharmacy med rec is done.  DVT prophylaxis: SCDs Code Status: Full Code (discussed with the patient) Family Communication: No family available at this time. Level of care: Progressive Care Unit Admission status: It is my clinical opinion that referral for OBSERVATION is reasonable and necessary in this patient based on the above information provided. The aforementioned taken together are felt to place the patient at high risk for further clinical deterioration. However, it is anticipated that the patient may be medically stable for discharge from the hospital within 24 to 48 hours.   John Giovanni MD [facility]  If 7PM-7AM, please contact night-coverage [email or web address]  04/03/2023, 4:33 AM

## 2023-04-03 NOTE — Procedures (Signed)
Patient Name: Tabitha Ramirez  MRN: [id number]  Epilepsy Attending: [name]  Referring Physician/Provider: John Giovanni, MD  Date: 04/03/2023 Duration: 23.21 mins  Patient history: 77 year old with history of breast cancer metastatic to the right parietal brain status post resection of the right parietal met and radiation, history of seizures with multiple seizures today. EEG to evaluate for seizure  Level of alertness: awake, sleep  AEDs during EEG study: LEV, Ativan, fosphenytoin  Technical aspects: This EEG study was done with scalp electrodes positioned according to the 10-20 International system of electrode placement. Electrical activity was reviewed with band pass filter of 1-70Hz , sensitivity of 7 uV/mm, display speed of 15mm/sec with a 60Hz  notched filter applied as appropriate. EEG data were recorded continuously and digitally stored.  Video monitoring was available and reviewed as appropriate.  Description: The posterior dominant rhythm consists of 8 Hz activity of moderate voltage (25-35 uV) seen predominantly in posterior head regions, symmetric and reactive to eye opening and eye closing. Sleep was characterized by vertex waves, sleep spindles (12 to 14 Hz), maximal frontocentral region.  There is an excessive amount of 15 to 18 Hz beta activity distributed symmetrically and diffusely.  EEG showed continuous polymorphic sharply contoured 3-6 theta- delta slowing admixed with sharp waves in right parietal region consistent with breach artifact. This pattern is waxing and waning in morphology and frequency, lasting 3 to 7 seconds consistent with brief ictal-interictal rhythmic discharges.  Hyperventilation and photic stimulation were not performed.     ABNORMALITY -Brief ictal-interictal rhythmic discharges, right parietal region - Breach artifact, right parietal region - Excessive beta, generalized  IMPRESSION: This study showed evidence of epileptogenicity arising from  right parietal region and is on the ictal-interictal continuum.  The waxing and waning nature of the pattern with overriding rhythmicity makes it more suspicious for ictal nature.  Additionally there is evidence of cortical dysfunction in right parietal region consistent with underlying craniotomy.  Excessive beta activity is a benign EEG pattern and is most likely secondary to benzodiazepine use  Dr. Otelia Limes was notified.  Lafaye Mcelmurry [name]

## 2023-04-03 NOTE — ED Triage Notes (Signed)
Pt BIB [facility] as ED to ED transfer from [facility] for neuro consult.

## 2023-04-03 NOTE — Consult Note (Signed)
NAME:  Tabitha Ramirez, MRN:  [id number], DOB:  08-14-1946, LOS: 0 ADMISSION DATE:  04/02/2023, CONSULTATION DATE:  04/03/23 REFERRING MD:  ED, CHIEF COMPLAINT:  seizure like activity   History of Present Illness:  77 y.o. 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  History largely via EMR.  Patient is encephalopathic, presumably related to benzodiazepine administration versus postictal state.  Was seen by oncologist on day of presentation.  I afternoon reportedly around 430 concern for seizure-like activity at home.  Was given a dose of Ativan.  Transported to ED.  Prior seizure activity most recently in May reportedly.  At that time Keppra was increased.  At time of evaluation the ED she complained of her arm being heavy.  Seem like have a hard time carrying on conversation.  This seems typical for her seizure presentation in the past per family at bedside.  Neurology was contacted in the ED.  They recommend additional dose of Keppra, loaded with fosphenytoin and transferred to [facility] for continuous EEG.  CT scan showed nothing acute.  Reportedly had abnormal movements of her left upper extremity.  Hospitalist at [facility] declined admission and recommended ICU consultation.  Discussed case with ED physician at [facility] and [facility].  Concern for lack of beds so recommended ED to ED transfer for expedited evaluation by neurology, critical care, initiation of EEG.  Pertinent  Medical History  Metastatic breast cancer to brain, seizure disorder on home Keppra  Significant Hospital Events: Including procedures, antibiotic start and stop dates in addition to other pertinent events   7/8 presents to [facility] long ED with concern for seizure-like activity with reported seizure-like activity in the ED as well 7/9 Transferred to [facility] for further evaluation and long-term EEG, ED to ED  transfer  Interim History / Subjective:    Objective   Blood pressure (!) 111/53, pulse 91, temperature 97.9 F (36.6 C), temperature source Oral, resp. rate (!) 21, height 5\' 3"  (1.6 m), weight 61 kg, SpO2 100 %.        Intake/Output Summary (Last 24 hours) at 04/03/2023 0021 Last data filed at 04/02/2023 2330 Gross per 24 hour  Intake 1323.35 ml  Output --  Net 1323.35 ml   Filed Weights   04/02/23 1817  Weight: 61 kg    Examination: General: Lying in stretcher, in no acute distress Eyes: EOMI, no icterus Lungs: Normal work of breathing, on room air Cardiovascular: Warm, no edema Abdomen: Nondistended, bowel sounds present MSK: No synovitis, no joint effusion Neuro: Encephalopathic, easily arousable, oriented to person, follows commands seems to have right gaze preference but unclear if this is true gaze preference or her encephalopathy limits her from following commands for longer periods of time   Resolved Hospital Problem list     Assessment & Plan:   History of seizure disorder with/presumably due to breast cancer with metastasis to brain: Reported seizure-like activity at home as well in the [place] long ED.  On my exam, she has been encephalopathic.  Presumably related to benzodiazepine administration versus possible postictal state. -- Appreciate neurology assistance -- Further imaging at neurology recommendations -- Long-term video EEG to assess for further seizure activity versus status epilepticus -- AED per neurology -- As needed benzodiazepines for seizure activity  Toxic Encephalopathy: Suspect result of benzodiazepine and possibly related to keppra vs post-itcal state. --EEG as above --Limit centrally acting medications  Hypothyroidism: Resume home Synthroid once taking p.o.  Hyperlipidemia: Resume home statin once able to take p.o.  Metastatic breast cancer: -- Supportive care -- Avoid opiates for now given encephalopathy, once improved consider  resumption of home oxycodone for chronic pain  PCCM will sign off. Recommend admission to progressive level of care.  Best Practice (right click and "Reselect all SmartList Selections" daily)   Per Primary  Labs   CBC: Recent Labs  Lab 04/02/23 2133  WBC 8.8  NEUTROABS 3.6  HGB 10.7*  HCT 32.4*  MCV 99.7  PLT 249    Basic Metabolic Panel: Recent Labs  Lab 04/02/23 2133  NA 139  K 3.7  CL 103  CO2 28  GLUCOSE 86  BUN 21  CREATININE 0.84  CALCIUM 9.5  MG 1.8   GFR: Estimated Creatinine Clearance: 47.9 mL/min (by C-G formula based on SCr of 0.84 mg/dL). Recent Labs  Lab 04/02/23 2133  WBC 8.8    Liver Function Tests: Recent Labs  Lab 04/02/23 2133  AST 29  ALT 35  ALKPHOS 84  BILITOT 0.5  PROT 8.1  ALBUMIN 3.9   No results for input(s): "LIPASE", "AMYLASE" in the last 168 hours. No results for input(s): "AMMONIA" in the last 168 hours.  ABG    Component Value Date/Time   TCO2 25 04/27/2013 0753     Coagulation Profile: No results for input(s): "INR", "PROTIME" in the last 168 hours.  Cardiac Enzymes: No results for input(s): "CKTOTAL", "CKMB", "CKMBINDEX", "TROPONINI" in the last 168 hours.  HbA1C: Hgb A1c MFr Bld  Date/Time Value Ref Range Status  04/27/2013 07:45 AM 7.1 (H) <5.7 % Final    Comment:    (NOTE)                                                                       According to the ADA Clinical Practice Recommendations for 2011, when HbA1c is used as a screening test:  >=6.5%   Diagnostic of Diabetes Mellitus           (if abnormal result is confirmed) 5.7-6.4%   Increased risk of developing Diabetes Mellitus References:Diagnosis and Classification of Diabetes Mellitus,Diabetes Care,2011,34(Suppl 1):S62-S69 and Standards of Medical Care in         Diabetes - 2011,Diabetes Care,2011,34 (Suppl 1):S11-S61.  03/21/2008 03:30 PM   Final   5.5 (NOTE)   The ADA recommends the following therapeutic goal for glycemic   control  related to Hgb A1C measurement:   Goal of Therapy:   < 7.0% Hgb A1C   Reference: American Diabetes Association: Clinical Practice   Recommendations 2008, Diabetes Care,  2008, 31:(Suppl 1).    CBG: Recent Labs  Lab 04/02/23 1810  GLUCAP 75    Review of Systems:   Unable to obtain due to mild encephalopathy but denies any chest pain, abdominal pain, shortness of breath.  Past Medical History:  She,  has a past medical history of Allergy, Anemia, Arthritis, Breast cancer (HCC), GERD (gastroesophageal reflux disease), Heart murmur, History of blood transfusion, Hypertension, Migraines, Personal history of chemotherapy, Personal history of radiation therapy, Radiation (11/05/13-12/24/13), and TMJ (temporomandibular joint syndrome).   Surgical History:   Past Surgical History:  Procedure Laterality Date   AXILLARY  LYMPH NODE BIOPSY Right 03/10/2013   Procedure: AXILLARY LYMPH NODE BIOPSY;  Surgeon: Ernestene Mention, MD;  Location: [facility] OR;  Service: General;  Laterality: Right;  sentinel node with blue dye   BREAST BIOPSY     BREAST LUMPECTOMY Right 09/29/2013   needle localization w/axillary LND/notes 09/29/2013   BREAST LUMPECTOMY WITH NEEDLE LOCALIZATION AND AXILLARY LYMPH NODE DISSECTION Right 09/29/2013   Procedure: RIGHT BREAST NEEDLE LOCALIZED LUMPECTOMY AND AXILLARY LYMPH NODE DISSECTION;  Surgeon: Ernestene Mention, MD;  Location: [facility] OR;  Service: General;  Laterality: Right;   COLONOSCOPY N/A 04/30/2013   Procedure: COLONOSCOPY;  Surgeon: Graylin Shiver, MD;  Location: [facility] ENDOSCOPY;  Service: Endoscopy;  Laterality: N/A;   CRANIOTOMY Right 03/11/2022   Procedure: CRANIOTOMY TUMOR EXCISION;  Surgeon: Venetia Night, MD;  Location: [facility] ORS;  Service: Neurosurgery;  Laterality: Right;   ESOPHAGOGASTRODUODENOSCOPY N/A 04/28/2013   Procedure: ESOPHAGOGASTRODUODENOSCOPY (EGD);  Surgeon: Graylin Shiver, MD;  Location: [facility];  Service: Endoscopy;  Laterality: N/A;   MASTECTOMY Right 09/29/2013    "partial"   PORT-A-CATH REMOVAL  09/29/2013   PORT-A-CATH REMOVAL Left 09/29/2013   Procedure: REMOVAL PORT-A-CATH;  Surgeon: Ernestene Mention, MD;  Location: [facility] OR;  Service: General;  Laterality: Left;   PORTACATH PLACEMENT N/A 03/10/2013   Procedure: INSERTION PORT-A-CATH WITH FLUOROSCOPY AND ULTRASOUND;  Surgeon: Ernestene Mention, MD;  Location: [facility] OR;  Service: General;  Laterality: N/A;   SURAL NERVE BX Right 05/13/2014   Procedure: SURAL NERVE BIOPSY;  Surgeon: Hewitt Shorts, MD;  Location: [facility] NEURO ORS;  Service: Neurosurgery;  Laterality: Right;  Right sural nerve biopsy   TONSILLECTOMY     TOTAL ABDOMINAL HYSTERECTOMY     With bilateral salpingo-oophorectomy     Social History:   reports that she has quit smoking. Her smoking use included cigarettes. She has never used smokeless tobacco. She reports that she does not drink alcohol and does not use drugs.   Family History:  Her family history includes Breast cancer in her maternal grandmother and mother; Cancer (age of onset: 42) in her maternal grandmother and mother; Colon cancer in her brother; Congestive Heart Failure in her brother and mother; Diabetes (age of onset: 36) in her brother; Heart disease in her brother.   Allergies Allergies  Allergen Reactions   Nsaids Nausea And Vomiting and Other (See Comments)    Pt states stomach ulcers, vomiting, and bleeding.   Penicillins Rash    Other reaction(s): Face swelling     Home Medications  Prior to Admission medications   Medication Sig Start Date End Date Taking? Authorizing Provider  acetaminophen (TYLENOL) 325 MG tablet Take 1-2 tablets (325-650 mg total) by mouth every 6 (six) hours as needed for mild pain. 02/13/23   Elgergawy, Leana Roe, MD  anastrozole (ARIMIDEX) 1 MG tablet Take 1 tablet (1 mg total) by mouth daily. 04/04/22   Serena Croissant, MD  Cholecalciferol (D3-1000) 25 MCG (1000 UT) capsule Take 1 capsule (1,000 Units total) by mouth daily. 03/27/22   Angiulli,  Mcarthur Rossetti, PA-C  ferrous sulfate 325 (65 FE) MG tablet Take 1 tablet (325 mg total) by mouth daily with breakfast. 03/27/22   Angiulli, Mcarthur Rossetti, PA-C  levETIRAcetam (KEPPRA) 1000 MG tablet Take 1 tablet (1,000 mg total) by mouth 2 (two) times daily. 02/21/23   Van Clines, MD  levothyroxine (SYNTHROID) 50 MCG tablet Take 1 tablet (50 mcg total) by mouth daily. 04/26/22   Jones Bales, NP  LORazepam (ATIVAN) 0.5 MG tablet Take 1 tablet as needed for seizure. Do not take more than 3 a week. 02/21/23   Van Clines, MD  oxyCODONE-acetaminophen (PERCOCET/ROXICET) 5-325 MG tablet Take 1 tablet by mouth every 8 (eight) hours as needed for severe pain. 03/07/23   Pickenpack-Cousar, Arty Baumgartner, NP  potassium chloride (KLOR-CON) 10 MEQ tablet Take 10 mEq by mouth daily. 03/10/22   [provider]  rosuvastatin (CRESTOR) 10 MG tablet Take 1 tablet (10 mg total) by mouth daily. 04/26/22   Jones Bales, NP  vitamin B-12 (CYANOCOBALAMIN) 1000 MCG tablet Take 1 tablet (1,000 mcg total) by mouth daily. 03/27/22   Angiulli, Mcarthur Rossetti, PA-C     Critical care time: n/a    Karren Burly, MD See Loretha Stapler

## 2023-04-03 NOTE — Plan of Care (Signed)
Brief ictal-interictal rhythmic discharges (BIRDs) in left parietal region.   LTM has been recommended by Dr. Melynda Ripple and has been ordered.   Electronically signed: Dr. Caryl Pina

## 2023-04-03 NOTE — Progress Notes (Signed)
Patient seen and examined, admitted by Dr. Loney Loh this morning.   Briefly 77 year old female with metastatic breast CA, with mets to right parietal lobe of the brain status postresection, craniotomy, XRT, seizures on Keppra, hypothyroidism, HLP, polyneuropathy, anemia presented to [facility] long ED after a seizure at home.  Patient was seen by Dr Barbaraann Cao on 7/8 with plans to continue Keppra and repeat MRI of the brain.  She was doing okay up to 4 PM and then had a witnessed seizure which lasted about 5 minutes and the daughter drove her to [facility] ED. she had another seizure at [facility] and was transferred to [facility] ED for neurology evaluation given concern for status epilepticus.  Patient was evaluated by CCM and neurology.  BP 107/63   Pulse 77   Temp 98.1 F (36.7 C)   Resp 18   Ht 5\' 3"  (1.6 m)   Wt 61 kg   SpO2 100%   BMI 23.82 kg/m  Labs reviewed Physical Exam General: Fairly alert and oriented now, close to her baseline.  Responding to verbal commands. Cardiovascular: S1 S2 clear, RRR.  Respiratory: CTAB, no wheezing, rales or rhonchi Gastrointestinal: Soft, nontender, nondistended, NBS Ext: no pedal edema bilaterally Neuro: no new deficits   Seizures -No fevers or any clear infectious etiology, UA negative, no leukocytosis -CT head showed no acute intracranial process, right parietal craniotomy with underlying resection appears similar to prior study -MRI brain pending.  Neurology following, recommending EEG after MRI is obtained -Received IV Keppra loading dose, Ativan 2 mg IV x 1, and fosphenytoin loading dose -Keppra dose increased to 1500 mg twice daily, seizure precautions   Lynnmarie Lovett M.D.  [facility] 04/03/2023, 10:47 AM

## 2023-04-03 NOTE — Progress Notes (Signed)
LTM EEG hooked up and running - no initial skin breakdown. MRI safe leads used

## 2023-04-03 NOTE — ED Provider Notes (Signed)
Patient transferred from outside facility with concern for status epilepticus.  History of metastatic breast cancer status postcraniotomy.  She is on Keppra.  Did have several seizures at home today as well as several seizures in the ED.\  She was loaded with Keppra as well as given fosphenytoin and Ativan.  Accepted to the ED to be seen by neurology for continuous EEG.  Discussed with Dr. Wilford Corner who is aware of patient.  Dr. Judeth Horn at bedside to evaluate for ICU admission.  Patient is somnolent, responds to voice, answers a few questions.  Her airway is stable.  No obvious seizure activity currently.  Patient is protecting her airway.  She has a right-sided gaze preference.  No facial asymmetry.  Able to raise arms and legs off the bed bilaterally with mildly decreased right than the left side.  Patient has been seen by critical care as well as neurology.  They both agree she is stable for stepdown admission.  Patient discussed with Dr. Loney Loh.   .Critical Care  Performed by: Glynn Octave, MD Authorized by: Glynn Octave, MD   Critical care provider statement:    Critical care time (minutes):  32   Critical care time was exclusive of:  Separately billable procedures and treating other patients   Critical care was necessary to treat or prevent imminent or life-threatening deterioration of the following conditions:  CNS failure or compromise   Critical care was time spent personally by me on the following activities:  Development of treatment plan with patient or surrogate, discussions with consultants, evaluation of patient's response to treatment, examination of patient, ordering and review of laboratory studies, ordering and review of radiographic studies, ordering and performing treatments and interventions, pulse oximetry, re-evaluation of patient's condition, review of old charts, blood draw for specimens and obtaining history from patient or surrogate   I assumed direction of  critical care for this patient from another provider in my specialty: no     Care discussed with: admitting provider       Glynn Octave, MD 04/03/23 0214

## 2023-04-04 ENCOUNTER — Inpatient Hospital Stay (HOSPITAL_COMMUNITY): Payer: Medicare PPO

## 2023-04-04 ENCOUNTER — Other Ambulatory Visit: Payer: Self-pay

## 2023-04-04 ENCOUNTER — Encounter (HOSPITAL_COMMUNITY): Payer: Self-pay

## 2023-04-04 ENCOUNTER — Ambulatory Visit (HOSPITAL_COMMUNITY): Admission: RE | Admit: 2023-04-04 | Payer: Medicare PPO | Source: Ambulatory Visit

## 2023-04-04 DIAGNOSIS — R569 Unspecified convulsions: Secondary | ICD-10-CM | POA: Diagnosis not present

## 2023-04-04 DIAGNOSIS — G893 Neoplasm related pain (acute) (chronic): Secondary | ICD-10-CM

## 2023-04-04 DIAGNOSIS — Z515 Encounter for palliative care: Secondary | ICD-10-CM

## 2023-04-04 DIAGNOSIS — C50911 Malignant neoplasm of unspecified site of right female breast: Secondary | ICD-10-CM

## 2023-04-04 LAB — LEVETIRACETAM LEVEL

## 2023-04-04 MED ORDER — LEVOTHYROXINE SODIUM 50 MCG PO TABS
50.0000 ug | ORAL_TABLET | Freq: Every day | ORAL | Status: DC
  Administered 2023-04-04 – 2023-04-05 (×2): 50 ug via ORAL
  Filled 2023-04-04 (×2): qty 1

## 2023-04-04 MED ORDER — OXYCODONE-ACETAMINOPHEN 5-325 MG PO TABS
1.0000 | ORAL_TABLET | Freq: Three times a day (TID) | ORAL | 0 refills | Status: AC | PRN
Start: 2023-04-07 — End: ?

## 2023-04-04 MED ORDER — ANASTROZOLE 1 MG PO TABS
1.0000 mg | ORAL_TABLET | Freq: Every day | ORAL | Status: DC
  Administered 2023-04-04 – 2023-04-05 (×2): 1 mg via ORAL
  Filled 2023-04-04 (×2): qty 1

## 2023-04-04 MED ORDER — VITAMIN D 25 MCG (1000 UNIT) PO TABS
1000.0000 [IU] | ORAL_TABLET | Freq: Every day | ORAL | Status: DC
  Administered 2023-04-04 – 2023-04-05 (×2): 1000 [IU] via ORAL
  Filled 2023-04-04 (×2): qty 1

## 2023-04-04 MED ORDER — LORAZEPAM 2 MG/ML IJ SOLN
1.0000 mg | Freq: Once | INTRAMUSCULAR | Status: AC | PRN
  Administered 2023-04-04: 1 mg via INTRAVENOUS
  Filled 2023-04-04: qty 1

## 2023-04-04 MED ORDER — VITAMIN B-12 1000 MCG PO TABS
1000.0000 ug | ORAL_TABLET | Freq: Every day | ORAL | Status: DC
  Administered 2023-04-04 – 2023-04-05 (×2): 1000 ug via ORAL
  Filled 2023-04-04 (×2): qty 1

## 2023-04-04 MED ORDER — OXYCODONE-ACETAMINOPHEN 5-325 MG PO TABS
1.0000 | ORAL_TABLET | Freq: Three times a day (TID) | ORAL | Status: DC | PRN
  Administered 2023-04-04: 1 via ORAL
  Filled 2023-04-04: qty 1

## 2023-04-04 MED ORDER — ROSUVASTATIN CALCIUM 5 MG PO TABS
10.0000 mg | ORAL_TABLET | Freq: Every day | ORAL | Status: DC
  Administered 2023-04-04 – 2023-04-05 (×2): 10 mg via ORAL
  Filled 2023-04-04 (×2): qty 2

## 2023-04-04 MED ORDER — GADOBUTROL 1 MMOL/ML IV SOLN
6.0000 mL | Freq: Once | INTRAVENOUS | Status: AC | PRN
  Administered 2023-04-04: 6 mL via INTRAVENOUS

## 2023-04-04 NOTE — Progress Notes (Signed)
EEG D/C'd. No skin breakdown noted. [facility] notified.  

## 2023-04-04 NOTE — Progress Notes (Signed)
PROGRESS NOTE                                                                                                                                                                                                             Patient Demographics:    Tabitha Ramirez, is a 77 y.o. 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  On Keppra at home, currently on IV Keppra, neurology following, EEG noted with evidence of epileptogenicity and cortical dysfunction in the right parietal region consistent with underlying craniotomy, MRI brain pending, will defer further management to neurology.  Currently seizure-free undergoing long-term EEG.  Metastatic breast cancer - Follows with heme-onc and radiation oncology.  Breast cancer treated with chemo and radiation in 2014.  Brain mets discovered in June 2023 and underwent craniotomy and resection of right parietal lobe mass on 03/11/2022.  Status post [facility] 05/03/2022.  No evidence of metastatic disease on CT chest/abdomen/pelvis in December 2023.  Brain MRI ordered at this time due to breakthrough seizures.  If stable post discharge follow-up with Dr. Pamelia Hoit and radiation oncology, Dr Neville Route, Dr  Barbaraann Cao, surgeon at [facility] Dr [place]   Hyperlipidemia - on statin  Hypothyroidism  - on synthroid       Condition - Extremely Guarded  Family Communication  :  daughter [name] - [phone]  - 04/04/23  Code Status :  Full  Consults  :  Neuro  PUD Prophylaxis :    Procedures  :     MRI brain.    EEG - This study showed  evidence of epileptogenicity and cortical dysfunction arising from right parietal region consistent with underlying craniotomy.  The EEG pattern does appear to wax and wane in morphology but doesn't evolve and is most likely not ictal. No definite seizures were noted.   CT - 1. No acute intracranial process. 2. Right parietal craniotomy with underlying resection site appears similar to the prior study.      Disposition Plan  :    Status is: Inpatient  DVT Prophylaxis  :    SCDs Start: 04/03/23 8657   Lab Results  Component Value Date   PLT 249 04/02/2023    Diet :  Diet Order             Diet Heart Room service appropriate? Yes; Fluid consistency: Thin  Diet effective now                    Inpatient Medications  Scheduled Meds:  anastrozole  1 mg Oral Daily   Cholecalciferol  1,000 Units Oral Daily   cyanocobalamin  1,000 mcg Oral Daily   levETIRAcetam  1,500 mg Oral BID   levothyroxine  50 mcg Oral Daily   LORazepam  2 mg Intravenous Once   rosuvastatin  10 mg Oral Daily   Continuous Infusions: PRN Meds:.acetaminophen **OR** acetaminophen  Antibiotics  :    Anti-infectives (From admission, onward)    None         Objective:   Vitals:   04/04/23 0000 04/04/23 0200 04/04/23 0400 04/04/23 0800  BP: 121/81  131/75 120/68  Pulse: 82 75 78 79  Resp: 17 20 (!) 24 19  Temp: 98.9 F (37.2 C)     TempSrc: Oral   Oral  SpO2: 98% 95% (!) 81% 99%  Weight:      Height:        Wt Readings from Last 3 Encounters:  04/02/23 61 kg  04/02/23 61.1 kg  02/21/23 59.6 kg    No intake or output data in the 24 hours ending  04/04/23 1102   Physical Exam  Awake Alert, No new F.N deficits, Normal affect [place].AT,PERRAL Supple Neck, No JVD,   Symmetrical Chest wall movement, Good air movement bilaterally, CTAB RRR,No Gallops,Rubs or new Murmurs,  +ve B.Sounds, Abd Soft, No tenderness,   No Cyanosis, Clubbing or edema       Data Review:    Recent Labs  Lab 04/02/23 2133  WBC 8.8  HGB 10.7*  HCT 32.4*  PLT 249  MCV 99.7  MCH 32.9  MCHC 33.0  RDW 13.1  LYMPHSABS 3.6  MONOABS 0.8  EOSABS 0.7*  BASOSABS 0.1    Recent Labs  Lab 04/02/23 2133  NA 139  K 3.7  CL 103  CO2 28  ANIONGAP 8  GLUCOSE 86  BUN 21  CREATININE 0.84  AST 29  ALT 35  ALKPHOS 84  BILITOT 0.5  ALBUMIN 3.9  MG 1.8  CALCIUM 9.5     Lab Results  Component Value Date   CHOL 164 11/29/2011   HDL 43 11/29/2011   LDLCALC 87 11/29/2011   TRIG 170 (H) 11/29/2011   CHOLHDL 3.8 11/29/2011    Lab Results  Component Value Date   HGBA1C 7.1 (H) 04/27/2013     Radiology Reports Overnight EEG with video  Result Date: 04/04/2023 Tabitha Quest, MD     04/04/2023  9:32 AM Patient Name: Tabitha Ramirez MRN: [id number] Epilepsy Attending: Charlsie Ramirez Referring Physician/Provider: Caryl Pina, MD  Duration: 04/03/2023 1259 to 04/04/2023 0930  Patient history: 77 year old with history of breast cancer metastatic to the right parietal brain status post resection of the right parietal met and radiation, history of seizures with multiple seizures today. EEG to evaluate for seizure  Level of alertness: awake, sleep  AEDs during EEG study: LEV  Technical aspects: This EEG study was done with scalp electrodes positioned according to the 10-20 International system of electrode placement. Electrical activity was reviewed with band pass filter of 1-70Hz , sensitivity of 7 uV/mm, display speed of 54mm/sec with a 60Hz  notched filter applied as appropriate. EEG data were recorded continuously and digitally stored.  Video monitoring was  available and reviewed as appropriate.  Description: The posterior dominant rhythm consists of 8 Hz activity of moderate voltage (25-35 uV) seen predominantly in posterior head regions, symmetric and reactive to eye opening and eye closing. Sleep was characterized by vertex waves, sleep spindles (12 to 14 Hz), maximal frontocentral region.  There is an excessive amount of 15 to 18 Hz beta activity distributed symmetrically and diffusely.  EEG showed continuous polymorphic sharply contoured 3-6 theta- delta slowing admixed with sharp waves in right parietal region consistent with breach artifact. This pattern is waxing and waning in morphology without definite evolution. Hyperventilation and photic stimulation were not performed.    ABNORMALITY - Spikes,  right parietal region - Breach artifact, right parietal region - Excessive beta, generalized  IMPRESSION: This study showed evidence of epileptogenicity and cortical dysfunction arising from right parietal region consistent with underlying craniotomy.  The EEG pattern does appear to wax and wane in morphology but doesn't evolve and is most likely not ictal. No definite seizures were noted.  Tabitha Ramirez   EEG adult  Result Date: 04/03/2023 Tabitha Quest, MD     04/04/2023  9:23 AM Patient Name: Tabitha Ramirez MRN: [id number] Epilepsy Attending: Charlsie Ramirez Referring Physician/Provider: John Giovanni, MD Date: 04/03/2023 Duration: 23.21 mins Patient history: 77 year old with history of breast cancer metastatic to the right parietal brain status post resection of the right parietal met and radiation, history of seizures with multiple seizures today. EEG to evaluate for seizure Level of alertness: awake, sleep AEDs during EEG study: LEV, Ativan, fosphenytoin Technical aspects: This EEG study was done with scalp electrodes positioned according to the 10-20 International system of electrode placement. Electrical activity was reviewed with band pass filter  of 1-70Hz , sensitivity of 7 uV/mm, display speed of 30mm/sec with a 60Hz  notched filter applied as appropriate. EEG data were recorded continuously and digitally stored.  Video monitoring was available and reviewed as appropriate. Description: The posterior dominant rhythm consists of 8 Hz activity of moderate voltage (25-35 uV) seen predominantly in posterior head regions, symmetric and reactive to eye opening and eye closing. Sleep was characterized by vertex waves, sleep spindles (12 to 14 Hz), maximal frontocentral region.  There is an excessive amount of 15 to 18 Hz beta activity distributed symmetrically and diffusely.  EEG showed continuous polymorphic sharply contoured 3-6 theta- delta slowing admixed with sharp waves in right parietal region consistent with breach artifact. This pattern is waxing and waning in morphology and frequency, lasting 3 to 7 seconds consistent with brief ictal-interictal rhythmic discharges.  Hyperventilation and photic stimulation were not performed.   ABNORMALITY -Brief ictal-interictal rhythmic discharges, right parietal region - Breach artifact, right parietal region - Excessive beta, generalized IMPRESSION: This study showed evidence of epileptogenicity arising from right parietal region and is on the ictal-interictal continuum.  The waxing and waning nature of the pattern with overriding rhythmicity makes it more suspicious for ictal nature. Additionally there is evidence of cortical dysfunction in right parietal region consistent with underlying craniotomy.  Excessive beta activity is a benign EEG pattern and is most likely secondary to benzodiazepine use Dr. Otelia Limes was notified. Tabitha Ramirez   CT HEAD WO CONTRAST  Result Date: 04/02/2023 CLINICAL DATA:  Metastatic disease EXAM: CT HEAD WITHOUT CONTRAST TECHNIQUE: Contiguous axial images were obtained from the base of the skull through the vertex without intravenous contrast. RADIATION DOSE REDUCTION: This exam was  performed according to the departmental dose-optimization program which includes automated exposure control, adjustment of the mA and/or kV according to patient size and/or use of iterative reconstruction technique. COMPARISON:  MRI head 02/12/2023.  CT head 02/10/2023 FINDINGS: Brain: Right parietal craniotomy with underlying focal rounded area of hypodensity appears similar to the prior study. Surrounding white matter hypodensity in this region is unchanged. There is no acute intracranial hemorrhage, mass effect or midline shift. There is stable mild periventricular white matter hypodensity, likely chronic small vessel ischemic change. Vascular: Atherosclerotic calcifications are present within the cavernous internal carotid arteries. Skull: No acute fractures. Sinuses/Orbits: No acute finding. Other: None. IMPRESSION: 1. No acute intracranial process. 2. Right parietal craniotomy with underlying resection site appears similar to the prior study. Electronically Signed   By: Darliss Cheney M.D.   On: 04/02/2023 20:22      Signature  -   Susa Raring M.D on 04/04/2023 at 11:02 AM   -  To page go to [email or web address]

## 2023-04-04 NOTE — Progress Notes (Signed)
Subjective: No complaints.   Objective: Current vital signs: BP 120/68   Pulse 79   Temp 98.9 F (37.2 C) (Oral)   Resp 19   Ht 5\' 3"  (1.6 m)   Wt 61 kg   SpO2 99%   BMI 23.82 kg/m  Vital signs in last 24 hours: Temp:  [98.2 F (36.8 C)-98.9 F (37.2 C)] 98.9 F (37.2 C) (07/10 0000) Pulse Rate:  [71-85] 79 (07/10 0800) Resp:  [12-28] 19 (07/10 0800) BP: (107-131)/(54-81) 120/68 (07/10 0800) SpO2:  [81 %-100 %] 99 % (07/10 0800)  Intake/Output from previous day: No intake/output data recorded. Intake/Output this shift: No intake/output data recorded. Nutritional status:  Diet Order             Diet Heart Room service appropriate? Yes; Fluid consistency: Thin  Diet effective now                  Physical Exam  HEENT:  [place]/AT Lungs: Respirations unlabored Extremities: Warm and well perfused.   Neurological Examination Mental Status: Awake and alert. Oriented x 4, misses the day. Speech fluent with intact naming and comprehension. Bradyphrenia noted. Cranial Nerves: II, III,IV, VI: Fixates and tracks normally.  V: Temp sensation equal bilaterally VII: Smile symmetric VIII: Hearing intact to conversation IX,X: No hoarseness or hypophonia XI: Symmetric XII: Midline tongue extension Motor: Right : Upper extremity   5/5    Left:     Upper extremity   5/5  Lower extremity   5/5     Lower extremity   5/5 No pronator drift Sensory: Temp and light touch intact throughout, bilaterally Deep Tendon Reflexes: 1+ and symmetric throughout Cerebellar: No ataxia with FNF bilaterally Gait: Deferred  Lab Results: Results for orders placed or performed during the hospital encounter of 04/02/23 (from the past 48 hour(s))  CBG monitoring, ED     Status: None   Collection Time: 04/02/23  6:10 PM  Result Value Ref Range   Glucose-Capillary 75 70 - 99 mg/dL    Comment: Glucose reference range applies only to samples taken after fasting for at least 8 hours.  Urinalysis,  Routine w reflex microscopic -Urine, Clean Catch     Status: Abnormal   Collection Time: 04/02/23  7:39 PM  Result Value Ref Range   Color, Urine STRAW (A) YELLOW   APPearance CLEAR CLEAR   Specific Gravity, Urine 1.011 1.005 - 1.030   pH 7.0 5.0 - 8.0   Glucose, UA NEGATIVE NEGATIVE mg/dL   Hgb urine dipstick NEGATIVE NEGATIVE   Bilirubin Urine NEGATIVE NEGATIVE   Ketones, ur NEGATIVE NEGATIVE mg/dL   Protein, ur NEGATIVE NEGATIVE mg/dL   Nitrite NEGATIVE NEGATIVE   Leukocytes,Ua NEGATIVE NEGATIVE    Comment: Performed at [facility], [address] [place], [place] [address]  Comprehensive metabolic panel     Status: None   Collection Time: 04/02/23  9:33 PM  Result Value Ref Range   Sodium 139 135 - 145 mmol/L   Potassium 3.7 3.5 - 5.1 mmol/L   Chloride 103 98 - 111 mmol/L   CO2 28 22 - 32 mmol/L   Glucose, Bld 86 70 - 99 mg/dL    Comment: Glucose reference range applies only to samples taken after fasting for at least 8 hours.   BUN 21 8 - 23 mg/dL   Creatinine, Ser 9.14 0.44 - 1.00 mg/dL   Calcium 9.5 8.9 - 78.2 mg/dL   Total Protein 8.1 6.5 - 8.1 g/dL  Albumin 3.9 3.5 - 5.0 g/dL   AST 29 15 - 41 U/L   ALT 35 0 - 44 U/L   Alkaline Phosphatase 84 38 - 126 U/L   Total Bilirubin 0.5 0.3 - 1.2 mg/dL   GFR, Estimated >16 >10 mL/min    Comment: (NOTE) Calculated using the CKD-EPI Creatinine Equation (2021)    Anion gap 8 5 - 15    Comment: Performed at [facility], [address], [place], [place] [address]  CBC with Differential/Platelet     Status: Abnormal   Collection Time: 04/02/23  9:33 PM  Result Value Ref Range   WBC 8.8 4.0 - 10.5 K/uL   RBC 3.25 (L) 3.87 - 5.11 MIL/uL   Hemoglobin 10.7 (L) 12.0 - 15.0 g/dL   HCT 40.9 (L) 81.1 - 91.4 %   MCV 99.7 80.0 - 100.0 fL   MCH 32.9 26.0 - 34.0 pg   MCHC 33.0 30.0 - 36.0 g/dL   RDW 78.2 95.6 - 21.3 %   Platelets 249 150 - 400 K/uL   nRBC 0.0 0.0 - 0.2 %   Neutrophils Relative  % 41 %   Neutro Abs 3.6 1.7 - 7.7 K/uL   Lymphocytes Relative 41 %   Lymphs Abs 3.6 0.7 - 4.0 K/uL   Monocytes Relative 9 %   Monocytes Absolute 0.8 0.1 - 1.0 K/uL   Eosinophils Relative 8 %   Eosinophils Absolute 0.7 (H) 0.0 - 0.5 K/uL   Basophils Relative 1 %   Basophils Absolute 0.1 0.0 - 0.1 K/uL   Immature Granulocytes 0 %   Abs Immature Granulocytes 0.01 0.00 - 0.07 K/uL    Comment: Performed at [facility], [address], [place], [place] [address]  Magnesium     Status: None   Collection Time: 04/02/23  9:33 PM  Result Value Ref Range   Magnesium 1.8 1.7 - 2.4 mg/dL    Comment: Performed at [facility], [address] [place], [place] [address]    No results found for this or any previous visit (from the past 240 hour(s)).  Lipid Panel No results for input(s): "CHOL", "TRIG", "HDL", "CHOLHDL", "VLDL", "LDLCALC" in the last 72 hours.  Studies/Results: Overnight EEG with video  Result Date: 04/04/2023 [name], MD     04/04/2023  9:32 AM Patient Name: Tabitha Ramirez MRN: [id number] Epilepsy Attending: [name] Referring Physician/Provider: Caryl Pina, MD Duration: 04/03/2023 1259 to 04/04/2023 0930  Patient history: 77 year old with history of breast cancer metastatic to the right parietal brain status post resection of the right parietal met and radiation, history of seizures with multiple seizures today. EEG to evaluate for seizure  Level of alertness: awake, sleep  AEDs during EEG study: LEV  Technical aspects: This EEG study was done with scalp electrodes positioned according to the 10-20 International system of electrode placement. Electrical activity was reviewed with band pass filter of 1-70Hz , sensitivity of 7 uV/mm, display speed of 15mm/sec with a 60Hz  notched filter applied as appropriate. EEG data were recorded continuously and digitally stored.  Video monitoring was available and reviewed as appropriate.   Description: The posterior dominant rhythm consists of 8 Hz activity of moderate voltage (25-35 uV) seen predominantly in posterior head regions, symmetric and reactive to eye opening and eye closing. Sleep was characterized by vertex waves, sleep spindles (12 to 14 Hz), maximal frontocentral region.  There is an excessive amount of 15 to 18 Hz beta  activity distributed symmetrically and diffusely.  EEG showed continuous polymorphic sharply contoured 3-6 theta- delta slowing admixed with sharp waves in right parietal region consistent with breach artifact. This pattern is waxing and waning in morphology without definite evolution. Hyperventilation and photic stimulation were not performed.    ABNORMALITY - Spikes,  right parietal region - Breach artifact, right parietal region - Excessive beta, generalized  IMPRESSION: This study showed evidence of epileptogenicity and cortical dysfunction arising from right parietal region consistent with underlying craniotomy.  The EEG pattern does appear to wax and wane in morphology but doesn't evolve and is most likely not ictal. No definite seizures were noted.  [name]   EEG adult  Result Date: 04/03/2023 [name], MD     04/04/2023  9:23 AM Patient Name: Tabitha Ramirez MRN: [id number] Epilepsy Attending: [name] Referring Physician/Provider: John Giovanni, MD Date: 04/03/2023 Duration: 23.21 mins Patient history: 77 year old with history of breast cancer metastatic to the right parietal brain status post resection of the right parietal met and radiation, history of seizures with multiple seizures today. EEG to evaluate for seizure Level of alertness: awake, sleep AEDs during EEG study: LEV, Ativan, fosphenytoin Technical aspects: This EEG study was done with scalp electrodes positioned according to the 10-20 International system of electrode placement. Electrical activity was reviewed with band pass filter of 1-70Hz , sensitivity of 7 uV/mm,  display speed of 60mm/sec with a 60Hz  notched filter applied as appropriate. EEG data were recorded continuously and digitally stored.  Video monitoring was available and reviewed as appropriate. Description: The posterior dominant rhythm consists of 8 Hz activity of moderate voltage (25-35 uV) seen predominantly in posterior head regions, symmetric and reactive to eye opening and eye closing. Sleep was characterized by vertex waves, sleep spindles (12 to 14 Hz), maximal frontocentral region.  There is an excessive amount of 15 to 18 Hz beta activity distributed symmetrically and diffusely.  EEG showed continuous polymorphic sharply contoured 3-6 theta- delta slowing admixed with sharp waves in right parietal region consistent with breach artifact. This pattern is waxing and waning in morphology and frequency, lasting 3 to 7 seconds consistent with brief ictal-interictal rhythmic discharges.  Hyperventilation and photic stimulation were not performed.   ABNORMALITY -Brief ictal-interictal rhythmic discharges, right parietal region - Breach artifact, right parietal region - Excessive beta, generalized IMPRESSION: This study showed evidence of epileptogenicity arising from right parietal region and is on the ictal-interictal continuum.  The waxing and waning nature of the pattern with overriding rhythmicity makes it more suspicious for ictal nature. Additionally there is evidence of cortical dysfunction in right parietal region consistent with underlying craniotomy.  Excessive beta activity is a benign EEG pattern and is most likely secondary to benzodiazepine use Dr. Otelia Limes was notified. [name]   CT HEAD WO CONTRAST  Result Date: 04/02/2023 CLINICAL DATA:  Metastatic disease EXAM: CT HEAD WITHOUT CONTRAST TECHNIQUE: Contiguous axial images were obtained from the base of the skull through the vertex without intravenous contrast. RADIATION DOSE REDUCTION: This exam was performed according to the  departmental dose-optimization program which includes automated exposure control, adjustment of the mA and/or kV according to patient size and/or use of iterative reconstruction technique. COMPARISON:  MRI head 02/12/2023.  CT head 02/10/2023 FINDINGS: Brain: Right parietal craniotomy with underlying focal rounded area of hypodensity appears similar to the prior study. Surrounding white matter hypodensity in this region is unchanged. There is no acute intracranial hemorrhage, mass effect or midline shift.  There is stable mild periventricular white matter hypodensity, likely chronic small vessel ischemic change. Vascular: Atherosclerotic calcifications are present within the cavernous internal carotid arteries. Skull: No acute fractures. Sinuses/Orbits: No acute finding. Other: None. IMPRESSION: 1. No acute intracranial process. 2. Right parietal craniotomy with underlying resection site appears similar to the prior study. Electronically Signed   By: Darliss Cheney M.D.   On: 04/02/2023 20:22    Medications: Scheduled:  levETIRAcetam  1,500 mg Oral BID   LORazepam  2 mg Intravenous Once     Assessment: 77 year old with history of breast cancer metastatic to the right parietal brain status post resection of the right parietal met and radiation, history of seizures with multiple seizures on Monday-1 seizure at home after which she remained with prolonged postictal state and another seizure at [facility] long hospital necessitating transfer to [facility] for neurological consultation and further evaluation. At the time of initial consult shortly after midnight on Tuesday, she was dysarthric in her speech, drowsy.  She had received Keppra, fosphenytoin and benzos for management of her seizure activity. Clinically she did not appear to be in status epilepticus on initial exam, but her not being back to baseline after a prolonged period following her clinically apparent seizures was suspicious for possible  electrographic status epilepticus.  - EEGs:  - Spot EEG obtained on Tuesday morning revealed right parietal BIRDs, an electrographic marker of focal epileptogenicity on the ictal-interictal continuum. The waxing and waning nature of the pattern with overriding rhythmicity was more suspicious for ictal nature. LTM EEG was then started.  - LTM EEG report for today AM (7/10):  Spikes, right parietal region; Breach artifact, right parietal region; Excessive beta, generalized. The study shows evidence of epileptogenicity and cortical dysfunction arising from right parietal region. The EEG pattern does appear to wax and wane in morphology but doesn't evolve and is most likely not ictal. No definite seizures were noted.  - Exam today reveals a pleasant and cooperative elderly female with bradyphrenia. No focal weakness or clinical seizure activity is noted. - CT-head: Compared to May 2024-unchanged appearance of the noncontrasted head CT with stable changes from the resection of the right parietal tumor and craniotomy. - MRI brain pending. - Overall impression:  - Breakthrough seizures in the setting of known brain metastatic disease from breast cancer - Evaluate for new brain mets versus progression of old mets   Recommendations: Needs evaluation for possible progression of metastatic process. Seizure precautions Continue Keppra 1500 mg PO twice daily.   MRI of the brain with and without contrast is still pending. Discontinuing LTM    LOS: 1 day   @Electronically  signed: Dr. Caryl Pina 04/04/2023  9:34 AM

## 2023-04-04 NOTE — Procedures (Addendum)
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 EEG to evaluate for seizure   Level of alertness: awake, sleep   AEDs during EEG study: LEV   Technical aspects: This EEG study was done with scalp electrodes positioned according to the 10-20 International system of electrode placement. Electrical activity was reviewed with band pass filter of 1-70Hz , sensitivity of 7 uV/mm, display speed of 52mm/sec with a 60Hz  notched filter applied as appropriate. EEG data were recorded continuously and digitally stored.  Video monitoring was available and reviewed as appropriate.   Description: The posterior dominant rhythm consists of 8 Hz activity of moderate voltage (25-35 uV) seen predominantly in posterior head regions, symmetric and reactive to eye opening and eye closing. Sleep was characterized by vertex waves, sleep spindles (12 to 14 Hz), maximal frontocentral region.  There is an excessive amount of 15 to 18 Hz beta activity distributed symmetrically and diffusely.  EEG showed continuous polymorphic sharply contoured 3-6 theta- delta slowing admixed with sharp waves in right parietal region consistent with breach artifact. This pattern is waxing and waning in morphology without definite evolution. Hyperventilation and photic stimulation were not performed.      ABNORMALITY - Spikes,  right parietal region - Breach artifact, right parietal region - Excessive beta, generalized   IMPRESSION: This study showed evidence of epileptogenicity and cortical dysfunction arising from right parietal region consistent with underlying craniotomy.  The EEG pattern does appear to  wax and wane in morphology but doesn't evolve and is most likely not ictal. No definite seizures were noted.    Trish Mancinelli [name]

## 2023-04-05 ENCOUNTER — Other Ambulatory Visit (HOSPITAL_COMMUNITY): Payer: Self-pay

## 2023-04-05 ENCOUNTER — Other Ambulatory Visit: Payer: Self-pay | Admitting: Nurse Practitioner

## 2023-04-05 ENCOUNTER — Other Ambulatory Visit: Payer: Self-pay

## 2023-04-05 DIAGNOSIS — Z515 Encounter for palliative care: Secondary | ICD-10-CM

## 2023-04-05 DIAGNOSIS — C50911 Malignant neoplasm of unspecified site of right female breast: Secondary | ICD-10-CM

## 2023-04-05 DIAGNOSIS — G893 Neoplasm related pain (acute) (chronic): Secondary | ICD-10-CM

## 2023-04-05 DIAGNOSIS — R569 Unspecified convulsions: Secondary | ICD-10-CM | POA: Diagnosis not present

## 2023-04-05 MED ORDER — LEVETIRACETAM 750 MG PO TABS
1500.0000 mg | ORAL_TABLET | Freq: Two times a day (BID) | ORAL | 0 refills | Status: DC
  Filled 2023-04-05: qty 60, 15d supply, fill #0

## 2023-04-05 MED ORDER — OXYCODONE-ACETAMINOPHEN 5-325 MG PO TABS
1.0000 | ORAL_TABLET | Freq: Four times a day (QID) | ORAL | 0 refills | Status: DC | PRN
Start: 2023-04-07 — End: 2023-04-05

## 2023-04-05 MED ORDER — LORAZEPAM 0.5 MG PO TABS
ORAL_TABLET | ORAL | 0 refills | Status: DC
  Filled 2023-04-05: qty 10, 24d supply, fill #0

## 2023-04-05 MED ORDER — OXYCODONE-ACETAMINOPHEN 5-325 MG PO TABS
1.0000 | ORAL_TABLET | Freq: Four times a day (QID) | ORAL | 0 refills | Status: DC | PRN
Start: 2023-04-05 — End: 2023-04-27

## 2023-04-05 NOTE — Evaluation (Signed)
Occupational Therapy Evaluation Patient Details Name: Tabitha Ramirez MRN: [id number] DOB: 1946/04/03 Today's Date: 04/05/2023   History of Present Illness 77 year old female with metastatic breast CA, with mets to right parietal lobe of the brain status postresection, craniotomy, XRT, seizures on Keppra, hypothyroidism, HLP, polyneuropathy, anemia presented after a seizure at home   Clinical Impression   Pt admitted with the above diagnosis. Pt currently with functional limitations due to the deficits listed below (see OT Problem List). Pt lives with daughter, son-in-law, and grandson at home. Reports that she is able to complete all BADL tasks at Mod I level. Uses RW at home and cane in community for functional mobility. Pt currently presents with deficits in sitting/standing balance, coordination, safety awareness/judgement. Recommend that daughter be with her when completing all transfers, functional mobility while using RW in home and in the community. Daughter should also assist as needed during bathing and dressing. Pt verbalize understanding. Pt would benefit from follow up Home Health OT to further improve pt's functional performance during daily tasks. Pt reports that plan is to discharge home today.  Pt will benefit from acute skilled OT to increase their safety and independence with ADL and functional mobility for ADL to facilitate discharge. OT will continue to follow patient acutely.        Recommendations for follow up therapy are one component of a multi-disciplinary discharge planning process, led by the attending physician.  Recommendations may be updated based on patient status, additional functional criteria and insurance authorization.   Assistance Recommended at Discharge Frequent or constant Supervision/Assistance  Patient can return home with the following A little help with walking and/or transfers;A little help with bathing/dressing/bathroom;Assistance with  cooking/housework;Direct supervision/assist for medications management;Direct supervision/assist for financial management;Assist for transportation;Help with stairs or ramp for entrance    Functional Status Assessment  Patient has had a recent decline in their functional status and demonstrates the ability to make significant improvements in function in a reasonable and predictable amount of time.  Equipment Recommendations  None recommended by OT    Recommendations for Other Services PT consult     Precautions / Restrictions Precautions Precautions: Fall Precaution Comments: seizure precautions Restrictions Weight Bearing Restrictions: No      Mobility Bed Mobility Overal bed mobility: Needs Assistance Bed Mobility: Sit to Supine, Supine to Sit     Supine to sit: Supervision, HOB elevated Sit to supine: Supervision   General bed mobility comments: HOB elevated. utilized bed rail to assist. Patient Response: Flat affect, Cooperative  Transfers Overall transfer level: Needs assistance Equipment used: Rolling walker (2 wheels) Transfers: Sit to/from Stand, Bed to chair/wheelchair/BSC Sit to Stand: Min assist, From elevated surface     Step pivot transfers: Min guard, From elevated surface     General transfer comment: Min A to power up and assist with initial balance d/t posterior lean. VC for safe hand placement during RW management.      Balance Overall balance assessment: Needs assistance Sitting-balance support: Feet supported, No upper extremity supported Sitting balance-Leahy Scale: Good Sitting balance - Comments: sitting EOB during LB dressing task.   Standing balance support: Bilateral upper extremity supported, During functional activity Standing balance-Leahy Scale: Poor Standing balance comment: reliant on external support, pt with posterior bias when initially standing. Once balance, pt able to maintain standing balance with min guard assist.         ADL either performed or assessed with clinical judgement   ADL Overall ADL's : Needs assistance/impaired  Grooming: Supervision/safety;Wash/dry hands;Standing Grooming Details (indicate cue type and reason): standing at sink Upper Body Bathing: Supervision/ safety;Set up;Sitting   Lower Body Bathing: Minimal assistance;Sit to/from stand;Sitting/lateral leans   Upper Body Dressing : Minimal assistance;Sitting   Lower Body Dressing: Minimal assistance;Sit to/from stand;Sitting/lateral leans Lower Body Dressing Details (indicate cue type and reason): doffed socks and donned hospital socks. Donned mesh underwear. Assist provided to place pad in underwear, placed sock on toes of left foot. Pulled mesh underwear fully up over buttocks d/t decreased hand strength and neuropathy deficits. Toilet Transfer: Min guard;BSC/3in1;Rolling walker (2 wheels);Ambulation Toilet Transfer Details (indicate cue type and reason): BSC placed over toilet Toileting- Clothing Manipulation and Hygiene: Minimal assistance;Sit to/from stand;Sitting/lateral lean Toileting - Clothing Manipulation Details (indicate cue type and reason): Pt completed hygiene. Required minimal assist pulling mesh underwear up over buttocks. OT managed hospital gown.       Vision Baseline Vision/History: 1 Wears glasses (wears for everything. Glasses not with pt presently to assess vision) Ability to See in Adequate Light: 0 Adequate Patient Visual Report: No change from baseline Additional Comments: Unable to fully assess as glasses were not available for use.     Perception Perception Comments: decreased left side physical awareness. Required VC to place hand on RW handle when walking from bathroom to sink.   Praxis Praxis Praxis-Other Comments: LUE impairement    Pertinent Vitals/Pain Pain Assessment Pain Assessment: No/denies pain Pain Score: 0-No pain     Hand Dominance Right   Extremity/Trunk Assessment Upper  Extremity Assessment Upper Extremity Assessment: LUE deficits/detail;RUE deficits/detail RUE Deficits / Details: slight decreased coordination. Able to complete fine motor tasks with increased time. A/ROM WFL with 4+/5 strength overall. Decreased gross grasp. RUE Sensation: history of peripheral neuropathy RUE Coordination: decreased fine motor LUE Deficits / Details: decreased coordiantion; motor sensory deficits noted; difficulty with fine motor tasks however able to complete with increased time. A/ROM WFL with 4+/5 strength overall. Decreased gross grasp. LUE Sensation: history of peripheral neuropathy LUE Coordination: decreased fine motor   Lower Extremity Assessment Lower Extremity Assessment: Defer to PT evaluation   Cervical / Trunk Assessment Cervical / Trunk Assessment: Normal   Communication Communication Communication: Other (comment) (soft spoken)   Cognition Arousal/Alertness: Awake/alert Behavior During Therapy: Flat affect Overall Cognitive Status: No family/caregiver present to determine baseline cognitive functioning Area of Impairment: Safety/judgement      Following Commands: Follows one step commands consistently Safety/Judgement: Decreased awareness of deficits, Decreased awareness of safety     General Comments: Required VC for safe RW management when walking. Demonstrated some left side neglect.     General Comments  VSS on RA            Home Living Family/patient expects to be discharged to:: Private residence Living Arrangements: Children (daughter, son-in-law, 71 yo grandson) Available Help at Discharge: Family;Available 24 hours/day Type of Home: House Home Access: Level entry     Home Layout: One level     Bathroom Shower/Tub: Chief Strategy Officer: Standard   How Accessible: Accessible via walker Home Equipment: Rolling Walker (2 wheels);Cane - single point;BSC/3in1;Grab bars - tub/shower;Hand held shower head;Tub bench           Prior Functioning/Environment Prior Level of Function : Needs assist       Physical Assist : ADLs (physical)     Mobility Comments: reports that she uses her cane in the community and RW in the home ADLs Comments: Due to neuropathy, pt does need  help opening tight or new jars. Reports she completes all BADL tasks without assistance.        OT Problem List: Decreased strength;Impaired balance (sitting and/or standing);Decreased activity tolerance;Decreased range of motion;Impaired vision/perception;Decreased coordination;Decreased cognition;Decreased safety awareness;Decreased knowledge of use of DME or AE;Impaired UE functional use;Pain      OT Treatment/Interventions: Self-care/ADL training;Therapeutic exercise;Neuromuscular education;Energy conservation;DME and/or AE instruction;Therapeutic activities;Cognitive remediation/compensation;Visual/perceptual remediation/compensation;Patient/family education;Balance training    OT Goals(Current goals can be found in the care plan section) Acute Rehab OT Goals Patient Stated Goal: to go home today OT Goal Formulation: Patient unable to participate in goal setting Time For Goal Achievement: 04/19/23 Potential to Achieve Goals: Good  OT Frequency: Min 2X/week       AM-PAC OT "6 Clicks" Daily Activity     Outcome Measure Help from another person eating meals?: A Little Help from another person taking care of personal grooming?: A Little Help from another person toileting, which includes using toliet, bedpan, or urinal?: A Little Help from another person bathing (including washing, rinsing, drying)?: A Little Help from another person to put on and taking off regular upper body clothing?: A Little Help from another person to put on and taking off regular lower body clothing?: A Little 6 Click Score: 18   End of Session Equipment Utilized During Treatment: Gait belt;Rolling walker (2 wheels) Nurse Communication: Mobility status  (nursing in room during mobility OOB)  Activity Tolerance: Patient tolerated treatment well Patient left: in chair;in bed;with call bell/phone within reach;with bed alarm set  OT Visit Diagnosis: Unsteadiness on feet (R26.81);Other abnormalities of gait and mobility (R26.89);Muscle weakness (generalized) (M62.81);Other symptoms and signs involving cognitive function;Other symptoms and signs involving the nervous system (R29.898)                Time: 1610-9604 OT Time Calculation (min): 56 min Charges:  OT General Charges $OT Visit: 1 Visit OT Evaluation $OT Eval Moderate Complexity: 1 Mod OT Treatments $Self Care/Home Management : 38-52 mins  Limmie Patricia, OTR/L,CBIS  Supplemental OT - [facility] and [facility] Secure Chat Preferred    Katilin Raynes, Charisse March 04/05/2023, 10:25 AM

## 2023-04-05 NOTE — Telephone Encounter (Signed)
Refill prescription sent per pt daughter request, dosage updated per Lowella Bandy, NP to reflect changes made to medication.

## 2023-04-05 NOTE — Discharge Instructions (Signed)
Do not drive, operate heavy machinery, perform activities at heights, swimming or participation in water activities or provide baby sitting services until you have seen by Primary MD or a Neurologist and advised to do so again.  Follow with Primary MD [facility] along with your neurologist and oncologist in 7 days   Get CBC, CMP, 2 view Chest X ray -  checked next visit with your primary MD    Activity: As tolerated with Full fall precautions use walker/cane & assistance as needed  Disposition Home    Diet: Heart Healthy    Special Instructions: If you have smoked or chewed Tobacco  in the last 2 yrs please stop smoking, stop any regular Alcohol  and or any Recreational drug use.  On your next visit with your primary care physician please Get Medicines reviewed and adjusted.  Please request your Prim.MD to go over all Hospital Tests and Procedure/Radiological results at the follow up, please get all Hospital records sent to your Prim MD by signing hospital release before you go home.  If you experience worsening of your admission symptoms, develop shortness of breath, life threatening emergency, suicidal or homicidal thoughts you must seek medical attention immediately by calling 911 or calling your MD immediately  if symptoms less severe.  You Must read complete instructions/literature along with all the possible adverse reactions/side effects for all the Medicines you take and that have been prescribed to you. Take any new Medicines after you have completely understood and accpet all the possible adverse reactions/side effects.

## 2023-04-05 NOTE — Evaluation (Signed)
Physical Therapy Evaluation Patient Details Name: Tabitha Ramirez MRN: [id number] DOB: 1945/10/29 Today's Date: 04/05/2023  History of Present Illness  Pt is a 77 y/o F admitted on 04/02/23 after presenting with c/o seizure at home. PMH: metastatic breast CA with mets to R parietal lobe of the brain s/p resection, craniotomy, XRT, seizures, hypothyroidism, HLP, polyneuropathy, anemia  Clinical Impression  Pt seen for PT evaluation with pt agreeable. Pt reports she will stay with family upon d/c and is ambulatory with SPC in the home, RW outside of the home. On this date, pt is able to complete bed mobility with supervision<>mod I with HOB elevated, use of bed rails PRN. Pt completes STS with min assist & ambulates to door & back with RW & CGA<>min assist. PT educates pt on recommendation to use RW at this time. Pt assisted with meal tray set up at end of session.        Assistance Recommended at Discharge Frequent or constant Supervision/Assistance  If plan is discharge home, recommend the following:  Can travel by private vehicle  A little help with walking and/or transfers;A little help with bathing/dressing/bathroom;Assistance with cooking/housework;Assist for transportation;Help with stairs or ramp for entrance        Equipment Recommendations None recommended by PT  Recommendations for Other Services       Functional Status Assessment Patient has had a recent decline in their functional status and demonstrates the ability to make significant improvements in function in a reasonable and predictable amount of time.     Precautions / Restrictions Precautions Precautions: Fall Precaution Comments: seizure precautions Restrictions Weight Bearing Restrictions: No      Mobility  Bed Mobility Overal bed mobility: Needs Assistance Bed Mobility: Supine to Sit     Supine to sit: Supervision, HOB elevated Sit to supine: HOB elevated, Modified independent (Device/Increase time)         Transfers Overall transfer level: Needs assistance Equipment used: Rolling walker (2 wheels) Transfers: Sit to/from Stand Sit to Stand: Min assist           General transfer comment: STS from EOB with RW with educational cuing re: hand placement, cuing for anterior weight shifting, min assist for balance    Ambulation/Gait Ambulation/Gait assistance: Min assist, Min guard Gait Distance (Feet): 20 Feet Assistive device: Rolling walker (2 wheels) Gait Pattern/deviations: Decreased step length - right, Decreased step length - left, Decreased stride length, Decreased stance time - left Gait velocity: decreased     General Gait Details: slight R lateral lean, cuing for hand placement on RW  Stairs            Wheelchair Mobility     Tilt Bed    Modified Rankin (Stroke Patients Only)       Balance Overall balance assessment: Needs assistance Sitting-balance support: Feet supported, No upper extremity supported Sitting balance-Leahy Scale: Good Sitting balance - Comments: sitting EOB without LOB   Standing balance support: Bilateral upper extremity supported, During functional activity Standing balance-Leahy Scale: Fair                               Pertinent Vitals/Pain Pain Assessment Pain Assessment: Faces Pain Location: neuropathy in BUE hands, slight R breast pain that is chronic 2/2 chemo Pain Descriptors / Indicators: Discomfort, Numbness Pain Intervention(s): Monitored during session    Home Living Family/patient expects to be discharged to:: Private residence Living Arrangements: Children Available Help at  Discharge: Family;Available 24 hours/day Type of Home: House Home Access: Level entry       Home Layout: One level Home Equipment: Agricultural consultant (2 wheels);Cane - single point;BSC/3in1;Grab bars - tub/shower;Hand held shower head;Tub bench      Prior Function Prior Level of Function : Needs assist       Physical Assist  : ADLs (physical)     Mobility Comments: reports that she uses her cane in the community and RW in the home ADLs Comments: Due to neuropathy, pt does need help opening tight or new jars. Reports she completes all BADL tasks without assistance.     Hand Dominance   Dominant Hand: Right    Extremity/Trunk Assessment   Upper Extremity Assessment Upper Extremity Assessment: LUE deficits/detail;RUE deficits/detail RUE Deficits / Details: Pt reports hx of BUE neuropathy, pt does have difficulty releasing grasp of utensil in LUE. Difficulty cutting food so PT assists. RUE Sensation: history of peripheral neuropathy RUE Coordination: decreased fine motor LUE Deficits / Details: decreased coordiantion; motor sensory deficits noted; difficulty with fine motor tasks however able to complete with increased time. A/ROM WFL with 4+/5 strength overall. Decreased gross grasp. LUE Sensation: history of peripheral neuropathy LUE Coordination: decreased fine motor    Lower Extremity Assessment Lower Extremity Assessment: Generalized weakness (pt reports hx of neuropathy)    Cervical / Trunk Assessment Cervical / Trunk Assessment: Normal  Communication   Communication: Other (comment) (soft spoken)  Cognition Arousal/Alertness: Awake/alert Behavior During Therapy: WFL for tasks assessed/performed Overall Cognitive Status: Within Functional Limits for tasks assessed                                 General Comments: Pt follows simple commands throughout session, pleasant, agreeable.        General Comments General comments (skin integrity, edema, etc.): VSS on RA    Exercises     Assessment/Plan    PT Assessment Patient needs continued PT services  PT Problem List Decreased strength;Decreased activity tolerance;Decreased balance;Decreased mobility;Decreased cognition;Decreased coordination;Decreased knowledge of use of DME;Decreased safety awareness       PT Treatment  Interventions DME instruction;Gait training;Stair training;Therapeutic activities;Functional mobility training;Therapeutic exercise;Balance training;Neuromuscular re-education;Cognitive remediation;Patient/family education    PT Goals (Current goals can be found in the Care Plan section)  Acute Rehab PT Goals Patient Stated Goal: go home PT Goal Formulation: With patient Time For Goal Achievement: 04/19/23 Potential to Achieve Goals: Good    Frequency Min 1X/week     Co-evaluation               AM-PAC PT "6 Clicks" Mobility  Outcome Measure Help needed turning from your back to your side while in a flat bed without using bedrails?: None Help needed moving from lying on your back to sitting on the side of a flat bed without using bedrails?: A Little Help needed moving to and from a bed to a chair (including a wheelchair)?: A Little Help needed standing up from a chair using your arms (e.g., wheelchair or bedside chair)?: A Little Help needed to walk in hospital room?: A Little Help needed climbing 3-5 steps with a railing? : A Little 6 Click Score: 19    End of Session   Activity Tolerance: Patient tolerated treatment well (limited by pt wishing to finish eating lunch) Patient left: in bed;with call bell/phone within reach;with bed alarm set (seated upright for lunch, set up with meal  tray)   PT Visit Diagnosis: Unsteadiness on feet (R26.81);Muscle weakness (generalized) (M62.81);Difficulty in walking, not elsewhere classified (R26.2)    Time: 2841-3244 PT Time Calculation (min) (ACUTE ONLY): 10 min   Charges:   PT Evaluation $PT Eval Low Complexity: 1 Low   PT General Charges $$ ACUTE PT VISIT: 1 Visit         Aleda Grana, PT, DPT 04/05/23, 11:38 AM   Sandi Mariscal 04/05/2023, 11:37 AM

## 2023-04-05 NOTE — TOC Transition Note (Signed)
Transition of Care [facility]) - CM/SW Discharge Note   Patient Details  Name: Tabitha Ramirez MRN: [id number] Date of Birth: [birth year]  Transition of Care [facility]) CM/SW Contact:  Tom-Johnson, Hershal Coria, RN Phone Number: 04/05/2023, 4:14 PM   Clinical Narrative:     Patient is scheduled for discharge today.  Readmission Risk Assessment done. Home health recommended by PT/OT. CM spoke with patient and daughter, Tabitha Ramirez. Tabitha Ramirez declined Home health at this time, requests outpatient PT/OT, stating patient was going to Outpatient therapy and would like to continue, info on AVS. Hospital f/u and discharge instructions on AVS. Prescriptions sent to [facility] pharmacy and meds will be delivered to patient at bedside prior discharge. Daughter, Tabitha Ramirez to transport at discharge.  No further TOC needs noted.      Final next level of care: OP Rehab Barriers to Discharge: Barriers Resolved   Patient Goals and CMS Choice CMS Medicare.gov Compare Post Acute Care list provided to:: Patient Choice offered to / list presented to : Patient, Adult Children Tabitha Ramirez)  Discharge Placement                  Patient to be transferred to facility by: Daughter Name of family member notified: [facility]    Discharge Plan and Services Additional resources added to the After Visit Summary for                  DME Arranged: N/A DME Agency: NA       HH Arranged: Refused HH HH Agency: NA        Social Determinants of Health (SDOH) Interventions SDOH Screenings   Depression (PHQ2-9): Low Risk  (12/27/2022)  Tobacco Use: Medium Risk (04/02/2023)     Readmission Risk Interventions     No data to display

## 2023-04-05 NOTE — Discharge Summary (Signed)
Tabitha Ramirez ZOX:[id number] DOB: 13-Apr-1946 DOA: 04/02/2023  PCP: [facility]  Admit date: 04/02/2023  Discharge date: 04/05/2023  Admitted From: Home   Disposition:  Home   Recommendations for Outpatient Follow-up:   Follow up with PCP in 1-2 weeks  PCP Please obtain BMP/CBC, 2 view CXR in 1week,  (see Discharge instructions)   PCP Please follow up on the following pending results:    Home Health: PT, OT   Equipment/Devices: None  Consultations: Neuro Discharge Condition: Stable    CODE STATUS: Full    Diet Recommendation: Heart Healthy     Chief Complaint  Patient presents with   Seizures     Brief history of present illness from the day of admission and additional interim summary    77 year old female with metastatic breast CA, 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  On Keppra at home, currently on IV Keppra, neurology following, EEG noted with evidence of epileptogenicity and cortical dysfunction in the right parietal region consistent with underlying craniotomy, MRI brain nonacute, seen by neurology Keppra dose increased to 1500 twice daily, stable on it, will be discharged on increased dose Keppra with outpatient follow-up with [facility] neurology and PCP.   Metastatic breast cancer - Follows with heme-onc and radiation oncology.  Breast cancer treated with chemo and radiation in 2014.  Brain mets discovered in June 2023 and underwent  craniotomy and resection of right parietal lobe mass on 03/11/2022.  Status post [facility] 05/03/2022.  No evidence of metastatic disease on CT chest/abdomen/pelvis in December 2023.  Brain MRI nonacute.  He is now stable and symptom-free on Keppra 1500 mg twice a day, cleared by neurology for discharge, requested post discharge to follow-up with [facility], PCP along with Dr. Pamelia Hoit and radiation oncology, Dr Neville Route, Dr Barbaraann Cao, surgeon at [facility] Dr Melrose Nakayama   Hyperlipidemia - on statin   Hypothyroidism  - on synthroid    Discharge diagnosis     Principal Problem:   Seizures (HCC) Active Problems:   HLD (hyperlipidemia)   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, right [facility])   Hypothyroidism    Discharge instructions    Discharge Instructions     Diet - low sodium heart healthy   Complete by: As directed    Discharge instructions   Complete by: As directed    Do not drive, operate heavy machinery, perform activities  at heights, swimming or participation in water activities or provide baby sitting services until you have seen by Primary MD or a Neurologist and advised to do so again.  Follow with Primary MD [facility] along with your neurologist and oncologist in 7 days   Get CBC, CMP, 2 view Chest X ray -  checked next visit with your primary MD    Activity: As tolerated with Full fall precautions use walker/cane & assistance as needed  Disposition Home    Diet: Heart Healthy    Special Instructions: If you have smoked or chewed Tobacco  in the last 2 yrs please stop smoking, stop any regular Alcohol  and or any Recreational drug use.  On your next visit with your primary care physician please Get Medicines reviewed and adjusted.  Please request your Prim.MD to go over all Hospital Tests and Procedure/Radiological results at the follow up, please get all Hospital records sent to your Prim MD by signing hospital release before you go home.  If  you experience worsening of your admission symptoms, develop shortness of breath, life threatening emergency, suicidal or homicidal thoughts you must seek medical attention immediately by calling 911 or calling your MD immediately  if symptoms less severe.  You Must read complete instructions/literature along with all the possible adverse reactions/side effects for all the Medicines you take and that have been prescribed to you. Take any new Medicines after you have completely understood and accpet all the possible adverse reactions/side effects.   Increase activity slowly   Complete by: As directed        Discharge Medications   Allergies as of 04/05/2023       Reactions   Nsaids Nausea And Vomiting, Other (See Comments)   Pt states stomach ulcers, vomiting, and bleeding.   Penicillins Rash   Other reaction(s): Face swelling        Medication List     TAKE these medications    acetaminophen 325 MG tablet Commonly known as: TYLENOL Take 1-2 tablets (325-650 mg total) by mouth every 6 (six) hours as needed for mild pain.   anastrozole 1 MG tablet Commonly known as: ARIMIDEX Take 1 tablet (1 mg total) by mouth daily.   B12-ACTIVE PO Take 1 tablet by mouth daily.   cyanocobalamin 1000 MCG tablet Commonly known as: VITAMIN B12 Take 1 tablet (1,000 mcg total) by mouth daily.   D3-1000 25 MCG (1000 UT) capsule Generic drug: Cholecalciferol Take 1 capsule (1,000 Units total) by mouth daily.   levETIRAcetam 750 MG tablet Commonly known as: KEPPRA Take 2 tablets (1,500 mg total) by mouth 2 (two) times daily. What changed:  medication strength how much to take   levothyroxine 50 MCG tablet Commonly known as: SYNTHROID Take 1 tablet (50 mcg total) by mouth daily.   LORazepam 0.5 MG tablet Commonly known as: ATIVAN Take 1 tablet as needed for seizure. Do not take more than 3 a week.   oxyCODONE-acetaminophen 5-325 MG tablet Commonly known as: PERCOCET/ROXICET Take 1  tablet by mouth every 8 (eight) hours as needed for severe pain. Start taking on: April 07, 2023 What changed: These instructions start on April 07, 2023. If you are unsure what to do until then, ask your doctor or other care provider.   potassium chloride 10 MEQ tablet Commonly known as: KLOR-CON Take 10 mEq by mouth daily.   rosuvastatin 10 MG tablet Commonly known as: CRESTOR Take 1 tablet (10 mg total) by mouth daily.  triamterene-hydrochlorothiazide 37.5-25 MG tablet Commonly known as: MAXZIDE-25 Take 1 tablet by mouth daily.         Follow-up Information     [place], [facility]. Schedule an appointment as soon as possible for a visit in 1 week(s).   Specialty: Family Medicine Why: And your neurologist within a week of discharge Contact information: [address] [place] [address] [phone]         [facility]. Schedule an appointment as soon as possible for a visit in 1 week(s).   Contact information: [address]     [address] [place] [address] [phone]                Major procedures and Radiology Reports - PLEASE review detailed and final reports thoroughly  -      MR Brain W and Wo Contrast  Result Date: 04/04/2023 CLINICAL DATA:  Neuro deficit, acute, stroke suspected EXAM: MRI HEAD WITHOUT AND WITH CONTRAST TECHNIQUE: Multiplanar, multiecho pulse sequences of the brain and surrounding structures were obtained without and with intravenous contrast. CONTRAST:  6mL GADAVIST GADOBUTROL 1 MMOL/ML IV SOLN COMPARISON:  MRI head Feb 12, 2023 FINDINGS: Motion limited study.  Within this limitation: Brain: Similar postsurgical changes of right parietal craniotomy with subjacent resection cavity. Smooth enhancement along the deep aspect of the resection cavity is similar to the prior and favored postsurgical. No progressive nodular enhancement. No new areas of enhancement. No  evidence of acute infarct, acute/new hemorrhage, mass lesion, midline shift or hydrocephalus. Small amount of restricted diffusion within the resection cavity likely represents postoperative hemorrhage and appears similar. Vascular: Major arterial flow voids are may at the skull base. Skull and upper cervical spine: Right parietal craniotomy. Otherwise, unremarkable marrow signal. Sinuses/Orbits: Mostly clear sinuses.  No acute orbital findings. Other: No mastoid effusions. IMPRESSION: 1. No evidence of acute intracranial abnormality or new enhancing lesions. 2. Smooth enhancement along the deep aspect of the resection cavity is similar to the prior and favored postsurgical. Recommend attention on follow-up. Electronically Signed   By: Feliberto Harts M.D.   On: 04/04/2023 17:49   Overnight EEG with video  Result Date: 04/04/2023 [name], MD     04/04/2023  9:32 AM Patient Name: Tabitha Ramirez MRN: [id number] Epilepsy Attending: [name] Referring Physician/Provider: Caryl Pina, MD Duration: 04/03/2023 1259 to 04/04/2023 0930  Patient history: 77 year old with history of breast cancer metastatic to the right parietal brain status post resection of the right parietal met and radiation, history of seizures with multiple seizures today. EEG to evaluate for seizure  Level of alertness: awake, sleep  AEDs during EEG study: LEV  Technical aspects: This EEG study was done with scalp electrodes positioned according to the 10-20 International system of electrode placement. Electrical activity was reviewed with band pass filter of 1-70Hz , sensitivity of 7 uV/mm, display speed of 54mm/sec with a 60Hz  notched filter applied as appropriate. EEG data were recorded continuously and digitally stored.  Video monitoring was available and reviewed as appropriate.  Description: The posterior dominant rhythm consists of 8 Hz activity of moderate voltage (25-35 uV) seen predominantly in posterior head regions,  symmetric and reactive to eye opening and eye closing. Sleep was characterized by vertex waves, sleep spindles (12 to 14 Hz), maximal frontocentral region.  There is an excessive amount of 15 to 18 Hz beta activity distributed symmetrically and diffusely.  EEG showed continuous polymorphic sharply contoured 3-6 theta-  delta slowing admixed with sharp waves in right parietal region consistent with breach artifact. This pattern is waxing and waning in morphology without definite evolution. Hyperventilation and photic stimulation were not performed.    ABNORMALITY - Spikes,  right parietal region - Breach artifact, right parietal region - Excessive beta, generalized  IMPRESSION: This study showed evidence of epileptogenicity and cortical dysfunction arising from right parietal region consistent with underlying craniotomy.  The EEG pattern does appear to wax and wane in morphology but doesn't evolve and is most likely not ictal. No definite seizures were noted.  [name]   EEG adult  Result Date: 04/03/2023 [name], MD     04/04/2023  9:23 AM Patient Name: Tabitha Ramirez MRN: [id number] Epilepsy Attending: [name] Referring Physician/Provider: John Giovanni, MD Date: 04/03/2023 Duration: 23.21 mins Patient history: 77 year old with history of breast cancer metastatic to the right parietal brain status post resection of the right parietal met and radiation, history of seizures with multiple seizures today. EEG to evaluate for seizure Level of alertness: awake, sleep AEDs during EEG study: LEV, Ativan, fosphenytoin Technical aspects: This EEG study was done with scalp electrodes positioned according to the 10-20 International system of electrode placement. Electrical activity was reviewed with band pass filter of 1-70Hz , sensitivity of 7 uV/mm, display speed of 67mm/sec with a 60Hz  notched filter applied as appropriate. EEG data were recorded continuously and digitally stored.  Video  monitoring was available and reviewed as appropriate. Description: The posterior dominant rhythm consists of 8 Hz activity of moderate voltage (25-35 uV) seen predominantly in posterior head regions, symmetric and reactive to eye opening and eye closing. Sleep was characterized by vertex waves, sleep spindles (12 to 14 Hz), maximal frontocentral region.  There is an excessive amount of 15 to 18 Hz beta activity distributed symmetrically and diffusely.  EEG showed continuous polymorphic sharply contoured 3-6 theta- delta slowing admixed with sharp waves in right parietal region consistent with breach artifact. This pattern is waxing and waning in morphology and frequency, lasting 3 to 7 seconds consistent with brief ictal-interictal rhythmic discharges.  Hyperventilation and photic stimulation were not performed.   ABNORMALITY -Brief ictal-interictal rhythmic discharges, right parietal region - Breach artifact, right parietal region - Excessive beta, generalized IMPRESSION: This study showed evidence of epileptogenicity arising from right parietal region and is on the ictal-interictal continuum.  The waxing and waning nature of the pattern with overriding rhythmicity makes it more suspicious for ictal nature. Additionally there is evidence of cortical dysfunction in right parietal region consistent with underlying craniotomy.  Excessive beta activity is a benign EEG pattern and is most likely secondary to benzodiazepine use Dr. Otelia Limes was notified. [name]   CT HEAD WO CONTRAST  Result Date: 04/02/2023 CLINICAL DATA:  Metastatic disease EXAM: CT HEAD WITHOUT CONTRAST TECHNIQUE: Contiguous axial images were obtained from the base of the skull through the vertex without intravenous contrast. RADIATION DOSE REDUCTION: This exam was performed according to the departmental dose-optimization program which includes automated exposure control, adjustment of the mA and/or kV according to patient size and/or use  of iterative reconstruction technique. COMPARISON:  MRI head 02/12/2023.  CT head 02/10/2023 FINDINGS: Brain: Right parietal craniotomy with underlying focal rounded area of hypodensity appears similar to the prior study. Surrounding white matter hypodensity in this region is unchanged. There is no acute intracranial hemorrhage, mass effect or midline shift. There is stable mild periventricular white matter hypodensity, likely chronic small vessel ischemic change.  Vascular: Atherosclerotic calcifications are present within the cavernous internal carotid arteries. Skull: No acute fractures. Sinuses/Orbits: No acute finding. Other: None. IMPRESSION: 1. No acute intracranial process. 2. Right parietal craniotomy with underlying resection site appears similar to the prior study. Electronically Signed   By: Darliss Cheney M.D.   On: 04/02/2023 20:22    Micro Results    No results found for this or any previous visit (from the past 240 hour(s)).  Today   Subjective    Tabitha Ramirez today has no headache,no chest abdominal pain,no new weakness tingling or numbness, feels much better wants to go home today.    Objective   Blood pressure (!) 102/92, pulse 88, temperature 98.4 F (36.9 C), temperature source Oral, resp. rate 19, height 5\' 3"  (1.6 m), weight 61 kg, SpO2 96%.  No intake or output data in the 24 hours ending 04/05/23 0815  Exam  Awake Alert, No new F.N deficits,    [place].AT,PERRAL Supple Neck,   Symmetrical Chest wall movement, Good air movement bilaterally, CTAB RRR,No Gallops,   +ve B.Sounds, Abd Soft, Non tender,  No Cyanosis, Clubbing or edema    Data Review   Recent Labs  Lab 04/02/23 2133  WBC 8.8  HGB 10.7*  HCT 32.4*  PLT 249  MCV 99.7  MCH 32.9  MCHC 33.0  RDW 13.1  LYMPHSABS 3.6  MONOABS 0.8  EOSABS 0.7*  BASOSABS 0.1    Recent Labs  Lab 04/02/23 2133  NA 139  K 3.7  CL 103  CO2 28  ANIONGAP 8  GLUCOSE 86  BUN 21  CREATININE 0.84  AST 29  ALT 35   ALKPHOS 84  BILITOT 0.5  ALBUMIN 3.9  MG 1.8  CALCIUM 9.5    Total Time in preparing paper work, data evaluation and todays exam - 35 minutes  Signature  -    Susa Raring M.D on 04/05/2023 at 8:15 AM   -  To page go to [email or web address]

## 2023-04-09 NOTE — Progress Notes (Unsigned)
Palliative Medicine [facility]  [phone] [phone]) [phone]   Name: Tabitha Ramirez Date: 04/09/2023 MRN: [id number]  DOB: [birth year]  Patient Care Team: [place] as PCP - General (Family Medicine) Pickenpack-Cousar, Arty Baumgartner, NP as Nurse Practitioner (Nurse Practitioner) Van Clines, MD as Consulting Physician (Neurology)   I connected with Tabitha Ramirez on 04/09/23 at  3:00 PM EDT by phone and verified that I am speaking with the correct person using two identifiers.   I discussed the limitations, risks, security and privacy concerns of performing an evaluation and management service by telemedicine and the availability of in-person appointments. I also discussed with the patient that there may be a patient responsible charge related to this service. The patient expressed understanding and agreed to proceed.   Other persons participating in the visit and their role in the encounter: daughter Tabitha Ramirez   Patient's location: home  Provider's location: [facility]   Chief Complaint: follow up of symptom mangement   INTERVAL HISTORY: Tabitha Ramirez is a 77 y.o. 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  Palliative ask to see for symptom management and goals of care.   SOCIAL HISTORY:     reports that she has quit smoking. Her smoking use included cigarettes. She has never used smokeless tobacco. She reports that she does not drink alcohol and does not use drugs.  ADVANCE DIRECTIVES: none in place   CODE STATUS: Full Code  PAST MEDICAL HISTORY: Past Medical History:  Diagnosis Date   Allergy    Anemia    Arthritis    "joints" (09/29/2013)   Breast cancer (HCC)    GERD (gastroesophageal reflux disease)    Heart murmur    History of blood transfusion    "related to chemo/breast cancer" (09/29/2013)   Hypertension    Migraines    Personal history of  chemotherapy    Personal history of radiation therapy    Radiation 11/05/13-12/24/13   Right breast    TMJ (temporomandibular joint syndrome)    "left; just dx'd" (09/29/2013    ALLERGIES:  is allergic to penicillins and nsaids.  MEDICATIONS:  Current Outpatient Medications  Medication Sig Dispense Refill   acetaminophen (TYLENOL) 325 MG tablet Take 1-2 tablets (325-650 mg total) by mouth every 6 (six) hours as needed for mild pain.     anastrozole (ARIMIDEX) 1 MG tablet Take 1 tablet (1 mg total) by mouth daily. 90 tablet 3   Cholecalciferol (D3-1000) 25 MCG (1000 UT) capsule Take 1 capsule (1,000 Units total) by mouth daily. 30 capsule 0   levETIRAcetam (KEPPRA) 750 MG tablet Take 2 tablets (1,500 mg total) by mouth 2 (two) times daily. 60 tablet 0   levothyroxine (SYNTHROID) 50 MCG tablet Take 1 tablet (50 mcg total) by mouth daily. 30 tablet 0   LORazepam (ATIVAN) 0.5 MG tablet Take 1 tablet as needed for seizure. Do not take more than 3 a week. 10 tablet 0   Methylcobalamin (B12-ACTIVE PO) Take 1 tablet by mouth daily.     oxyCODONE-acetaminophen (PERCOCET/ROXICET) 5-325 MG tablet Take 1 tablet by mouth every 6 (six) hours as needed for severe pain. 90 tablet 0   potassium chloride (KLOR-CON) 10 MEQ tablet Take 10 mEq by mouth daily.     rosuvastatin (CRESTOR) 10 MG tablet Take 1 tablet (10 mg total) by mouth daily. 30 tablet 0   triamterene-hydrochlorothiazide (MAXZIDE-25) 37.5-25 MG tablet Take 1 tablet  by mouth daily.     vitamin B-12 (CYANOCOBALAMIN) 1000 MCG tablet Take 1 tablet (1,000 mcg total) by mouth daily. 30 tablet 0   No current facility-administered medications for this visit.    VITAL SIGNS: T 98.5, HR 84, R 16, BP 163/87   Estimated body mass index is 23.82 kg/m as calculated from the following:   Height as of 04/02/23: 5\' 3"  (1.6 m).   Weight as of 04/02/23: 134 lb 7.7 oz (61 kg).   PERFORMANCE STATUS (ECOG) : 1 - Symptomatic but completely  ambulatory    IMPRESSION:  I spoke with patient's daughter Tabitha Ramirez via phone for follow-up. Tabitha Ramirez recently discharged from hospital after having another seizure. Medications have been adjusted. She states since discharge has been doing well. No additional seizures. Denies nausea, vomiting, constipation, or diarrhea. Taking things one day at a time. Appetite is fair. Some days are better than others.   We discussed Tabitha Ramirez pain. Over the past month patient has experienced some increase in pain. She doesn't require medication around the clock but at times additional dose is needed. Reports pain is controlled on current regimen. No changes at this time. Patient taking as prescribed. This is evident based on refill request.   Pain is well controlled. Will continue support and follow.    PLAN: Continue Oxycodone/APAP 5/325 mg every 6 hours as needed for pain Continue Lorazepam 0.5 mg as needed for anxiety.  We will continue to closely monitor and adjust medications for symptom management. Ongoing goals of care discussions and support as needed I will plan to see patient back in 4-6 weeks in collaboration with her oncology appointments.  Daughter knows to contact our office sooner if needed.   Patient expressed understanding and was in agreement with this plan. She also understands that She can call the clinic at any time with any questions, concerns, or complaints.   Any controlled substances utilized were prescribed in the context of palliative care. PDMP has been reviewed.    Visit consisted of counseling and education dealing with the complex and emotionally intense issues of symptom management and palliative care in the setting of serious and potentially life-threatening illness.Greater than 50%  of this time was spent counseling and coordinating care related to the above assessment and plan.  Willette Alma, AGPCNP-BC  Palliative Medicine Team/[facility]  *Please note that this is a verbal dictation therefore any spelling or grammatical errors are due to the "Dragon [employer] One" system interpretation.

## 2023-04-10 ENCOUNTER — Inpatient Hospital Stay (HOSPITAL_BASED_OUTPATIENT_CLINIC_OR_DEPARTMENT_OTHER): Payer: Medicare PPO | Admitting: Nurse Practitioner

## 2023-04-10 ENCOUNTER — Encounter: Payer: Self-pay | Admitting: Nurse Practitioner

## 2023-04-10 DIAGNOSIS — Z515 Encounter for palliative care: Secondary | ICD-10-CM

## 2023-04-10 DIAGNOSIS — G893 Neoplasm related pain (acute) (chronic): Secondary | ICD-10-CM | POA: Diagnosis not present

## 2023-04-10 DIAGNOSIS — R530 Neoplastic (malignant) related fatigue: Secondary | ICD-10-CM

## 2023-04-10 DIAGNOSIS — F419 Anxiety disorder, unspecified: Secondary | ICD-10-CM

## 2023-04-10 DIAGNOSIS — C7931 Secondary malignant neoplasm of brain: Secondary | ICD-10-CM

## 2023-04-10 DIAGNOSIS — C50911 Malignant neoplasm of unspecified site of right female breast: Secondary | ICD-10-CM | POA: Diagnosis not present

## 2023-04-19 ENCOUNTER — Encounter: Payer: Self-pay | Admitting: *Deleted

## 2023-04-20 ENCOUNTER — Telehealth: Payer: Self-pay | Admitting: Internal Medicine

## 2023-04-26 ENCOUNTER — Other Ambulatory Visit: Payer: Self-pay | Admitting: Hematology and Oncology

## 2023-04-27 ENCOUNTER — Other Ambulatory Visit: Payer: Self-pay

## 2023-04-27 DIAGNOSIS — Z515 Encounter for palliative care: Secondary | ICD-10-CM

## 2023-04-27 DIAGNOSIS — G893 Neoplasm related pain (acute) (chronic): Secondary | ICD-10-CM

## 2023-04-27 DIAGNOSIS — C50911 Malignant neoplasm of unspecified site of right female breast: Secondary | ICD-10-CM

## 2023-04-27 MED ORDER — OXYCODONE-ACETAMINOPHEN 5-325 MG PO TABS
1.0000 | ORAL_TABLET | Freq: Four times a day (QID) | ORAL | 0 refills | Status: DC | PRN
Start: 2023-04-27 — End: 2023-05-17

## 2023-05-01 ENCOUNTER — Encounter: Payer: Self-pay | Admitting: Internal Medicine

## 2023-05-01 ENCOUNTER — Inpatient Hospital Stay: Payer: Medicare PPO | Attending: Hematology and Oncology | Admitting: Internal Medicine

## 2023-05-01 DIAGNOSIS — R569 Unspecified convulsions: Secondary | ICD-10-CM

## 2023-05-01 NOTE — Progress Notes (Signed)
I connected with Tabitha Ramirez on 05/01/23 at 11:00 AM EDT by telephone visit and verified that I am speaking with the correct person using two identifiers.   I discussed the limitations, risks, security and privacy concerns of performing an evaluation and management service by telemedicine and the availability of in-person appointments. I also discussed with the patient that there may be a patient responsible charge related to this service. The patient expressed understanding and agreed to proceed.   Other persons participating in the visit and their role in the encounter:  daughter   Patient's location:  Home Provider's location:  Office Chief Complaint:  Seizures (HCC)  History of Present Ilness: Tabitha Ramirez reports no clinical changes today.  She has not experienced further seizure episodes since the [facility] hospitalization on 7/22.  Other event was 7/8.  Continues on Keppra 1500mg  twice per day, no issues with the drug.    Observations: Language and cognition at baseline  Assessment and Plan: Seizures (HCC)  Clinically stable.  Recommended continuing Keppra 1500mg  BID.    Will re-eval after MRI at next visit later this month.  Follow Up Instructions: RTC as scheduled.  I discussed the assessment and treatment plan with the patient.  The patient was provided an opportunity to ask questions and all were answered.  The patient agreed with the plan and demonstrated understanding of the instructions.    The patient was advised to call back or seek an in-person evaluation if the symptoms worsen or if the condition fails to improve as anticipated.    Henreitta Leber, MD   I provided 22 minutes of non face-to-face telephone visit time during this encounter, and > 50% was spent counseling as documented under my assessment & plan.

## 2023-05-04 ENCOUNTER — Telehealth: Payer: Self-pay | Admitting: Internal Medicine

## 2023-05-04 NOTE — Telephone Encounter (Signed)
Scheduled per 08/06 los, patient has been called and voicemail was left.

## 2023-05-07 ENCOUNTER — Other Ambulatory Visit: Payer: Medicare PPO

## 2023-05-10 ENCOUNTER — Ambulatory Visit: Payer: Self-pay | Admitting: Urology

## 2023-05-10 ENCOUNTER — Ambulatory Visit: Payer: Medicare PPO | Admitting: Internal Medicine

## 2023-05-17 ENCOUNTER — Other Ambulatory Visit: Payer: Self-pay | Admitting: Nurse Practitioner

## 2023-05-17 DIAGNOSIS — C50911 Malignant neoplasm of unspecified site of right female breast: Secondary | ICD-10-CM

## 2023-05-17 DIAGNOSIS — Z515 Encounter for palliative care: Secondary | ICD-10-CM

## 2023-05-17 DIAGNOSIS — G893 Neoplasm related pain (acute) (chronic): Secondary | ICD-10-CM

## 2023-05-17 MED ORDER — OXYCODONE-ACETAMINOPHEN 5-325 MG PO TABS
1.0000 | ORAL_TABLET | Freq: Four times a day (QID) | ORAL | 0 refills | Status: DC | PRN
Start: 2023-05-17 — End: 2023-05-25

## 2023-05-21 ENCOUNTER — Other Ambulatory Visit: Payer: Self-pay

## 2023-05-21 ENCOUNTER — Encounter (HOSPITAL_COMMUNITY): Payer: Self-pay

## 2023-05-21 ENCOUNTER — Emergency Department (HOSPITAL_COMMUNITY): Payer: Medicare PPO

## 2023-05-21 ENCOUNTER — Inpatient Hospital Stay: Admission: RE | Admit: 2023-05-21 | Payer: Medicare PPO | Source: Ambulatory Visit

## 2023-05-21 ENCOUNTER — Observation Stay (HOSPITAL_COMMUNITY): Payer: Medicare PPO

## 2023-05-21 ENCOUNTER — Encounter: Payer: Self-pay | Admitting: Internal Medicine

## 2023-05-21 ENCOUNTER — Inpatient Hospital Stay (HOSPITAL_COMMUNITY)
Admission: EM | Admit: 2023-05-21 | Discharge: 2023-05-25 | DRG: 101 | Disposition: A | Discharge status: Skilled Nursing Facility | Payer: Medicare PPO | Attending: Internal Medicine | Admitting: Internal Medicine

## 2023-05-21 DIAGNOSIS — Z85841 Personal history of malignant neoplasm of brain: Secondary | ICD-10-CM | POA: Diagnosis not present

## 2023-05-21 DIAGNOSIS — E785 Hyperlipidemia, unspecified: Secondary | ICD-10-CM | POA: Diagnosis present

## 2023-05-21 DIAGNOSIS — Z800 Family history of malignant neoplasm of digestive organs: Secondary | ICD-10-CM

## 2023-05-21 DIAGNOSIS — R569 Unspecified convulsions: Secondary | ICD-10-CM | POA: Diagnosis not present

## 2023-05-21 DIAGNOSIS — Z87891 Personal history of nicotine dependence: Secondary | ICD-10-CM

## 2023-05-21 DIAGNOSIS — G893 Neoplasm related pain (acute) (chronic): Secondary | ICD-10-CM

## 2023-05-21 DIAGNOSIS — K219 Gastro-esophageal reflux disease without esophagitis: Secondary | ICD-10-CM | POA: Diagnosis present

## 2023-05-21 DIAGNOSIS — I10 Essential (primary) hypertension: Secondary | ICD-10-CM | POA: Diagnosis present

## 2023-05-21 DIAGNOSIS — Z880 Allergy status to penicillin: Secondary | ICD-10-CM

## 2023-05-21 DIAGNOSIS — Z515 Encounter for palliative care: Secondary | ICD-10-CM

## 2023-05-21 DIAGNOSIS — Z79811 Long term (current) use of aromatase inhibitors: Secondary | ICD-10-CM

## 2023-05-21 DIAGNOSIS — G40909 Epilepsy, unspecified, not intractable, without status epilepticus: Principal | ICD-10-CM | POA: Diagnosis present

## 2023-05-21 DIAGNOSIS — E876 Hypokalemia: Secondary | ICD-10-CM | POA: Diagnosis present

## 2023-05-21 DIAGNOSIS — Z853 Personal history of malignant neoplasm of breast: Secondary | ICD-10-CM

## 2023-05-21 DIAGNOSIS — Z9011 Acquired absence of right breast and nipple: Secondary | ICD-10-CM

## 2023-05-21 DIAGNOSIS — Z79890 Hormone replacement therapy (postmenopausal): Secondary | ICD-10-CM

## 2023-05-21 DIAGNOSIS — F410 Panic disorder [episodic paroxysmal anxiety] without agoraphobia: Secondary | ICD-10-CM | POA: Diagnosis present

## 2023-05-21 DIAGNOSIS — Z20822 Contact with and (suspected) exposure to covid-19: Secondary | ICD-10-CM | POA: Diagnosis present

## 2023-05-21 DIAGNOSIS — E039 Hypothyroidism, unspecified: Secondary | ICD-10-CM | POA: Diagnosis present

## 2023-05-21 DIAGNOSIS — Z833 Family history of diabetes mellitus: Secondary | ICD-10-CM

## 2023-05-21 DIAGNOSIS — Z79899 Other long term (current) drug therapy: Secondary | ICD-10-CM

## 2023-05-21 DIAGNOSIS — Z809 Family history of malignant neoplasm, unspecified: Secondary | ICD-10-CM

## 2023-05-21 DIAGNOSIS — Z803 Family history of malignant neoplasm of breast: Secondary | ICD-10-CM

## 2023-05-21 DIAGNOSIS — Z8249 Family history of ischemic heart disease and other diseases of the circulatory system: Secondary | ICD-10-CM

## 2023-05-21 DIAGNOSIS — U071 COVID-19: Secondary | ICD-10-CM

## 2023-05-21 DIAGNOSIS — Z886 Allergy status to analgesic agent status: Secondary | ICD-10-CM

## 2023-05-21 DIAGNOSIS — D509 Iron deficiency anemia, unspecified: Secondary | ICD-10-CM | POA: Diagnosis present

## 2023-05-21 DIAGNOSIS — Z9221 Personal history of antineoplastic chemotherapy: Secondary | ICD-10-CM

## 2023-05-21 DIAGNOSIS — Z923 Personal history of irradiation: Secondary | ICD-10-CM

## 2023-05-21 DIAGNOSIS — I4510 Unspecified right bundle-branch block: Secondary | ICD-10-CM | POA: Diagnosis present

## 2023-05-21 DIAGNOSIS — C50911 Malignant neoplasm of unspecified site of right female breast: Secondary | ICD-10-CM

## 2023-05-21 DIAGNOSIS — Z8616 Personal history of COVID-19: Secondary | ICD-10-CM

## 2023-05-21 LAB — CBC
HCT: 31.9 % — ABNORMAL LOW (ref 36.0–46.0)
Hemoglobin: 10.4 g/dL — ABNORMAL LOW (ref 12.0–15.0)
MCH: 31.9 pg (ref 26.0–34.0)
MCHC: 32.6 g/dL (ref 30.0–36.0)
MCV: 97.9 fL (ref 80.0–100.0)
Platelets: 272 10*3/uL (ref 150–400)
RBC: 3.26 MIL/uL — ABNORMAL LOW (ref 3.87–5.11)
RDW: 12.5 % (ref 11.5–15.5)
WBC: 10.2 10*3/uL (ref 4.0–10.5)
nRBC: 0 % (ref 0.0–0.2)

## 2023-05-21 LAB — RESPIRATORY PANEL BY PCR

## 2023-05-21 LAB — BASIC METABOLIC PANEL
Anion gap: 11 (ref 5–15)
BUN: 12 mg/dL (ref 8–23)
CO2: 24 mmol/L (ref 22–32)
Calcium: 9.7 mg/dL (ref 8.9–10.3)
Chloride: 103 mmol/L (ref 98–111)
Creatinine, Ser: 0.88 mg/dL (ref 0.44–1.00)
GFR, Estimated: >60 mL/min (ref >60)
Glucose, Bld: 113 mg/dL — ABNORMAL HIGH (ref 70–99)
Potassium: 3.7 mmol/L (ref 3.5–5.1)
Sodium: 138 mmol/L (ref 135–145)

## 2023-05-21 LAB — CBG MONITORING, ED: Glucose-Capillary: 96 mg/dL (ref 70–99)

## 2023-05-21 LAB — CK: Total CK: 56 U/L (ref 38–234)

## 2023-05-21 LAB — SARS CORONAVIRUS 2 BY RT PCR: SARS Coronavirus 2 by RT PCR: POSITIVE — AB

## 2023-05-21 MED ORDER — ROSUVASTATIN CALCIUM 5 MG PO TABS
10.0000 mg | ORAL_TABLET | Freq: Every day | ORAL | Status: DC
  Administered 2023-05-22 – 2023-05-25 (×4): 10 mg via ORAL
  Filled 2023-05-21 (×4): qty 2

## 2023-05-21 MED ORDER — LACOSAMIDE 50 MG PO TABS
50.0000 mg | ORAL_TABLET | Freq: Two times a day (BID) | ORAL | Status: DC
  Administered 2023-05-22 – 2023-05-25 (×7): 50 mg via ORAL
  Filled 2023-05-21 (×7): qty 1

## 2023-05-21 MED ORDER — VITAMIN D 25 MCG (1000 UNIT) PO TABS
1000.0000 [IU] | ORAL_TABLET | Freq: Every day | ORAL | Status: DC
  Administered 2023-05-22 – 2023-05-25 (×4): 1000 [IU] via ORAL
  Filled 2023-05-21 (×4): qty 1

## 2023-05-21 MED ORDER — ENOXAPARIN SODIUM 40 MG/0.4ML IJ SOSY
40.0000 mg | PREFILLED_SYRINGE | INTRAMUSCULAR | Status: DC
  Administered 2023-05-21 – 2023-05-24 (×4): 40 mg via SUBCUTANEOUS
  Filled 2023-05-21 (×4): qty 0.4

## 2023-05-21 MED ORDER — LEVETIRACETAM IN NACL 1000 MG/100ML IV SOLN
1000.0000 mg | Freq: Three times a day (TID) | INTRAVENOUS | Status: DC
  Administered 2023-05-21 – 2023-05-22 (×2): 1000 mg via INTRAVENOUS
  Filled 2023-05-21 (×2): qty 100

## 2023-05-21 MED ORDER — VITAMIN B-12 1000 MCG PO TABS
500.0000 ug | ORAL_TABLET | Freq: Every day | ORAL | Status: DC
  Administered 2023-05-22 – 2023-05-25 (×4): 500 ug via ORAL
  Filled 2023-05-21 (×5): qty 1

## 2023-05-21 MED ORDER — LORAZEPAM 2 MG/ML IJ SOLN
2.0000 mg | Freq: Once | INTRAMUSCULAR | Status: AC
  Administered 2023-05-21: 2 mg via INTRAVENOUS
  Filled 2023-05-21: qty 1

## 2023-05-21 MED ORDER — LEVOTHYROXINE SODIUM 100 MCG PO TABS
100.0000 ug | ORAL_TABLET | Freq: Every day | ORAL | Status: DC
  Administered 2023-05-22 – 2023-05-25 (×4): 100 ug via ORAL
  Filled 2023-05-21 (×4): qty 1

## 2023-05-21 MED ORDER — TRIAMTERENE-HCTZ 37.5-25 MG PO TABS
1.0000 | ORAL_TABLET | Freq: Every day | ORAL | Status: DC
  Administered 2023-05-22 – 2023-05-25 (×4): 1 via ORAL
  Filled 2023-05-21 (×4): qty 1

## 2023-05-21 MED ORDER — SODIUM CHLORIDE 0.9 % IV SOLN
50.0000 mg | Freq: Two times a day (BID) | INTRAVENOUS | Status: DC
  Filled 2023-05-21 (×2): qty 5

## 2023-05-21 MED ORDER — SODIUM CHLORIDE 0.9 % IV SOLN
200.0000 mg | Freq: Once | INTRAVENOUS | Status: AC
  Administered 2023-05-21: 200 mg via INTRAVENOUS
  Filled 2023-05-21: qty 20

## 2023-05-21 MED ORDER — FERROUS SULFATE 325 (65 FE) MG PO TABS
325.0000 mg | ORAL_TABLET | Freq: Every day | ORAL | Status: DC
  Administered 2023-05-22 – 2023-05-25 (×4): 325 mg via ORAL
  Filled 2023-05-21 (×4): qty 1

## 2023-05-21 MED ORDER — LEVETIRACETAM IN NACL 1500 MG/100ML IV SOLN
1500.0000 mg | Freq: Once | INTRAVENOUS | Status: AC
  Administered 2023-05-21: 1500 mg via INTRAVENOUS
  Filled 2023-05-21: qty 100

## 2023-05-21 MED ORDER — ONDANSETRON HCL 4 MG/2ML IJ SOLN
4.0000 mg | Freq: Once | INTRAMUSCULAR | Status: AC
  Administered 2023-05-21: 4 mg via INTRAVENOUS
  Filled 2023-05-21: qty 2

## 2023-05-21 MED ORDER — ANASTROZOLE 1 MG PO TABS
1.0000 mg | ORAL_TABLET | Freq: Every day | ORAL | Status: DC
  Administered 2023-05-22 – 2023-05-25 (×4): 1 mg via ORAL
  Filled 2023-05-21 (×4): qty 1

## 2023-05-21 NOTE — H&P (Cosign Needed Addendum)
Date: 05/21/2023               Patient Name:  Tabitha Ramirez MRN: [id number]  DOB: October 27, 1945 Age / Sex: 77 y.o., female   PCP: [facility]         Medical Service: Internal Medicine Teaching Service         Attending Physician: Dr. Wynetta Fines, MD      First Contact: Larrie Kass MS4 [phone]    Second Contact: Dr. 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 Presented to the ED with her son in law due to a witnessed focal tonic clonic seizure. History was limited due to patients sedation in the ED, history given by son in law. At 10am patient started having her seizure symptoms which included left sided weakness and difficulty rising from a seated position, difficulty using left arm with fine motor skills, and confusion. The family states they are aware of her seizure symptoms which prompted them to give the her 1G Keppra and ativan. They presented to the ED at 11:00 am, and state the seizure symptoms were at their peak about at hour later, the ED gave 1.5G keppra and 2mg  ativan at 13:30. Which promptly ended the patients seizure symptoms.  According to the family, this seizure is very similar to the other 4 seizures she has had over the last year, however, they said this one resolved earlier than previous ones. Of note the only new symptom the patient complained of before the seizure was a severe pressure like headache on the posterior aspect on the left side of her head.  The patients grandson  tested positive for Covid-19, and the family thinks she may have gotten it as well. The patient has had a cough for the last week initially with green sputum, and now more of a dry cough.  Around June 24th 2024 was the patients last seizure, at that time they went to [facility] and the Keppra dosing was increased to the patients current amount of 1G three times a day.  Denies/Pertinent Negatives: No other medication changes, fevers, travel, recreation/illicit drug use, alcohol use,   ED Course: Labs ordered include CBC, BMP, CBG. Images-CT head w/o contrast. EKG obtained WBC 10.2, RBC 3.26, Hb 10.4, Hct 31.9 Na 138, K 3.7, Cl 103, CO2 24, Glucose 113, BUN 12, SrCr 0.88, Ca 9.7, GFR 60, anion gap 11  Patient given 1G keppra at 9am and in the ED 1.5G at approx 1330 also 2G ativan given Active partial seizure with tonic clonic movements of left upper extremity seen in the ED  Meds:  Keppra 1G tid Lacosamide 50mg  bid Anastrazole 1g every day Cholecalciferol 1000U every day Ferrous Sulfate 325mg  every day Synthroid every day Rosuvastatin 10mg  every day Cyanocobalamin every day Triamterene-hydrochlorothiazide 37.5-25mg  every day  No current facility-administered medications on file prior to encounter.   Current Outpatient Medications on File Prior to Encounter  Medication Sig Dispense Refill   acetaminophen (TYLENOL) 325 MG tablet  Take 1-2 tablets (325-650 mg total) by mouth every 6 (six) hours as needed for mild pain.     anastrozole (ARIMIDEX) 1 MG tablet TAKE 1 TABLET(1 MG) BY MOUTH DAILY 90 tablet 3   Cholecalciferol (D3-1000) 25 MCG (1000 UT) capsule Take 1 capsule (1,000 Units total) by mouth daily. 30 capsule 0   ferrous sulfate 325 (65 FE) MG tablet Take 325 mg by mouth daily with breakfast.     levETIRAcetam (KEPPRA) 1000 MG tablet Take 1,000 mg by mouth in the morning, at noon, and at bedtime.     levothyroxine (SYNTHROID) 100 MCG tablet Take 100 mcg by mouth daily  before breakfast.     LORazepam (ATIVAN) 0.5 MG tablet Take 1 tablet as needed for seizure. Do not take more than 3 a week. (Patient taking differently: Take 0.5 mg by mouth daily as needed for seizure.) 10 tablet 0   oxyCODONE-acetaminophen (PERCOCET/ROXICET) 5-325 MG tablet Take 1 tablet by mouth every 6 (six) hours as needed for severe pain. 90 tablet 0   potassium chloride (KLOR-CON) 10 MEQ tablet Take 10 mEq by mouth daily.     rosuvastatin (CRESTOR) 10 MG tablet Take 1 tablet (10 mg total) by mouth daily. 30 tablet 0   triamterene-hydrochlorothiazide (MAXZIDE-25) 37.5-25 MG tablet Take 1 tablet by mouth daily.     vitamin B-12 (CYANOCOBALAMIN) 500 MCG tablet Take 500 mcg by mouth daily.     levETIRAcetam (KEPPRA) 750 MG tablet Take 2 tablets (1,500 mg total) by mouth 2 (two) times daily. (Patient not taking: Reported on 05/21/2023) 60 tablet 0   levothyroxine (SYNTHROID) 50 MCG tablet Take 1 tablet (50 mcg total) by mouth daily. (Patient not taking: Reported on 05/21/2023) 30 tablet 0    Past Medical History  Breast cancer with mets to brain Brain mets found June 2023 (s/p resection) Craniotomy and resection of right parietal lobe mass on 03/11/2022  Breakthrough seizures since brain resection HLD HTN GERD Hypothyroidism Chronic RBBB on EKG Chronic microcytic anemia  Past Medical History:  Diagnosis Date   Allergy    Anemia    Arthritis    "joints" (09/29/2013)   Breast cancer (HCC)    GERD (gastroesophageal reflux disease)    Heart murmur    History of blood transfusion    "related to chemo/breast cancer" (09/29/2013)   Hypertension    Migraines    Personal history of chemotherapy    Personal history of radiation therapy    Radiation 11/05/13-12/24/13   Right breast    TMJ (temporomandibular joint syndrome)    "left; just dx'd" (09/29/2013    Past Surgical History:  Procedure Laterality Date   AXILLARY LYMPH NODE BIOPSY Right 03/10/2013   Procedure: AXILLARY LYMPH NODE  BIOPSY;  Surgeon: Ernestene Mention, MD;  Location: [facility] OR;  Service: General;  Laterality: Right;  sentinel node with blue dye   BREAST BIOPSY     BREAST LUMPECTOMY Right 09/29/2013   needle localization w/axillary LND/notes 09/29/2013   BREAST LUMPECTOMY WITH NEEDLE LOCALIZATION AND AXILLARY LYMPH NODE DISSECTION Right 09/29/2013   Procedure: RIGHT BREAST NEEDLE LOCALIZED LUMPECTOMY AND AXILLARY LYMPH NODE DISSECTION;  Surgeon: Ernestene Mention, MD;  Location: [facility] OR;  Service: General;  Laterality: Right;   COLONOSCOPY N/A 04/30/2013   Procedure: COLONOSCOPY;  Surgeon: Graylin Shiver, MD;  Location: [facility];  Service: Endoscopy;  Laterality: N/A;   CRANIOTOMY Right 03/11/2022   Procedure: CRANIOTOMY TUMOR EXCISION;  Surgeon: Venetia Night, MD;  Location: [facility] ORS;  Service: Neurosurgery;  Laterality: Right;   ESOPHAGOGASTRODUODENOSCOPY N/A 04/28/2013   Procedure: ESOPHAGOGASTRODUODENOSCOPY (EGD);  Surgeon: Graylin Shiver, MD;  Location: [facility];  Service: Endoscopy;  Laterality: N/A;   MASTECTOMY Right 09/29/2013   "partial"   PORT-A-CATH REMOVAL  09/29/2013   PORT-A-CATH REMOVAL Left 09/29/2013   Procedure: REMOVAL PORT-A-CATH;  Surgeon: Ernestene Mention, MD;  Location: [facility] OR;  Service: General;  Laterality: Left;   PORTACATH PLACEMENT N/A 03/10/2013   Procedure: INSERTION PORT-A-CATH WITH FLUOROSCOPY AND ULTRASOUND;  Surgeon: Ernestene Mention, MD;  Location: [facility] OR;  Service: General;  Laterality: N/A;   SURAL NERVE BX Right 05/13/2014   Procedure: SURAL NERVE BIOPSY;  Surgeon: Hewitt Shorts, MD;  Location: [facility];  Service: Neurosurgery;  Laterality: Right;  Right sural nerve biopsy   TONSILLECTOMY     TOTAL ABDOMINAL HYSTERECTOMY     With bilateral salpingo-oophorectomy    Social:  Lives With: Daughter, Son in Social worker, Grandson Occupation: n/a Support: Family Level of Function: Independent with ADLs PCP: [facility] Substances: No tobacco, No marijuana, No  alcohol, no recreational/illicit drug use.  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 Currently work as Lawyer   Right handed    Lives with daughter and son in law    Social Determinants of Health   Financial Resource Strain: Not on file  Food Insecurity: Not on file  Transportation Needs: Not on file  Physical Activity: Not on file  Stress: Not on file  Social Connections: Not on file  Intimate Partner Violence: Not on file   Family History:  Family History  Problem Relation Age of Onset   Cancer Mother 5       Unknown type of cancer   Congestive Heart Failure Mother    Breast cancer Mother    Colon cancer Brother    Diabetes Brother 74   Heart disease Brother        AMI 05/22/2012   Congestive Heart Failure Brother    Cancer Maternal Grandmother 34       Unknown type of cancer   Breast cancer Maternal Grandmother     Allergies: Allergies as of 05/21/2023 - Review Complete 05/21/2023  Allergen Reaction Noted   Penicillins Rash 04/09/2008   Nsaids Nausea And Vomiting and Other (See Comments) 10/22/2017    Review of Systems: A complete ROS was negative except as per HPI.   OBJECTIVE:   Physical Exam: Blood pressure  133/76, pulse 74, temperature 98.5 F (36.9 C), resp. rate (!) 24, height 5\' 3"  (1.6 m), weight 61 kg, SpO2  100%.  Constitutional: Sedated female, lying in bed, responds to stimuli, able to follow some commands, appears physically decompensated. HENT: normocephalic, dry mucous membranes, missing teeth, no active oral bleeding, no tongue laceration Eyes: conjunctiva non-erythematous, PERRLA, unable to assess eom at this time Cardiovascular: regular rate and rhythm, Systolic murmur present  Pulmonary/Chest: normal work of breathing on room air, lungs clear to auscultation bilaterally Abdominal: soft, non-tender, non-distended Neurological: Difficult to perform and unable to fully assess. Pupils equal round and reactive. 3/5 grip strength in right hand wouldn't squeeze on left. Flaccid limbs without any rigidity. Patient slowly moved all limbs without prompting during the exam.  Skin: warm and dry  Labs: CBC    Component Value Date/Time   WBC 10.2 05/21/2023 1255   RBC 3.26 (L) 05/21/2023 1255   HGB 10.4 (L) 05/21/2023 1255   HGB 10.7 (L) 06/22/2014 1107   HCT 31.9 (L) 05/21/2023 1255   HCT 32.5 (L) 06/22/2014 1107   PLT 272 05/21/2023 1255   PLT 284 06/22/2014 1107   MCV 97.9 05/21/2023 1255   MCV 102.2 (H) 06/22/2014 1107   MCH 31.9 05/21/2023 1255   MCHC 32.6 05/21/2023 1255   RDW 12.5 05/21/2023 1255   RDW 14.7 (H) 06/22/2014 1107   LYMPHSABS 3.6 04/02/2023 2133   LYMPHSABS 2.4 06/22/2014 1107   MONOABS 0.8 04/02/2023 2133   MONOABS 0.7 06/22/2014 1107   EOSABS 0.7 (H) 04/02/2023 2133   EOSABS 0.2 06/22/2014 1107   BASOSABS 0.1 04/02/2023 2133   BASOSABS 0.0 06/22/2014 1107     CMP     Component Value Date/Time   NA 138 05/21/2023 1255   NA 140 03/30/2015 0944   K 3.7 05/21/2023 1255   K 3.8 03/30/2015 0944   CL 103 05/21/2023 1255   CL 100 03/13/2013 0918   CO2 24 05/21/2023 1255   CO2 26 03/30/2015 0944   GLUCOSE 113 (H) 05/21/2023 1255   GLUCOSE 103 03/30/2015  0944   GLUCOSE 169 (H) 03/13/2013 0918   BUN 12 05/21/2023 1255   BUN 18.9 03/30/2015 0944   CREATININE 0.88 05/21/2023 1255   CREATININE 0.9 03/30/2015 0944   CALCIUM 9.7 05/21/2023 1255   CALCIUM 9.7 03/30/2015 0944   PROT 8.1 04/02/2023 2133   PROT 7.9 03/30/2015 0944   ALBUMIN 3.9 04/02/2023 2133   ALBUMIN 3.1 (L) 03/30/2015 0944   AST 29 04/02/2023 2133   AST 20 03/30/2015 0944   ALT 35 04/02/2023 2133   ALT 19 03/30/2015 0944   ALKPHOS 84 04/02/2023 2133   ALKPHOS 100 03/30/2015 0944   BILITOT 0.5 04/02/2023 2133   BILITOT 0.45 03/30/2015 0944   GFRNONAA >60 05/21/2023 1255   GFRNONAA 64 11/29/2011 1535   GFRAA 85 (L) 05/12/2014 1238   GFRAA 74 11/29/2011 1535  CBG (last 3)  Recent Labs    05/21/23 1302  GLUCAP 96   Imaging: CT Head Wo Contrast  Result Date: 05/21/2023 CLINICAL DATA:  Mental status change EXAM: CT HEAD WITHOUT CONTRAST TECHNIQUE: Contiguous axial images were obtained from the base of the skull through the vertex without intravenous contrast. RADIATION DOSE REDUCTION: This exam was performed according to the departmental dose-optimization program which includes automated exposure control, adjustment of the mA and/or kV according to patient size and/or use of iterative reconstruction technique. COMPARISON:  04/02/2023, 02/10/2023 FINDINGS: Brain: No evidence of acute infarction, hemorrhage, extra-axial collection, ventriculomegaly, or mass effect. Right parietal encephalomalacia. Generalized cerebral atrophy. Periventricular white matter low attenuation likely secondary to microangiopathy.  Vascular: Cerebrovascular atherosclerotic calcifications are noted. Skull: Negative for fracture or focal lesion. Prior right parietal craniotomy. Sinuses/Orbits: Visualized portions of the orbits are unremarkable. Visualized portions of the paranasal sinuses are unremarkable. Visualized portions of the mastoid air cells are unremarkable. Other: None. IMPRESSION: 1. No acute  intracranial pathology. 2. Chronic microvascular disease and cerebral atrophy. Electronically Signed   By: Elige Ko M.D.   On: 05/21/2023 14:53     EKG: personally reviewed my interpretation is normal ventricular rate of 80 BPM, Normal sinus rhythm, Normal QRS axis, PR interval , QT402, QTC460, inverted T waves in V1-V3   . Prior EKG is present and was reviewed.  EKG Interpretation Date/Time:  Monday May 21 2023 12:55:01 EDT Ventricular Rate:  79 PR Interval:  160 QRS Duration:  126 QT Interval:  402 QTC Calculation: 460 R Axis:   -14  Text Interpretation: Normal sinus rhythm Right bundle branch block Abnormal ECG When compared with ECG of 02-Apr-2023 18:13, PREVIOUS ECG IS PRESENT Confirmed by Kristine Royal [phone]) on 05/21/2023 1:27:32 PM   ASSESSMENT & PLAN:   Assessment & Plan by Problem: Active Problems:   * No active hospital problems. *  ELLENE MARKO is a 77 y.o. person living with a history of Breast cancer (s/p chemo and radiation and radiation) with mets to the brain, brain cancer s/p resection about 1 year ago, seizures, HTN, HLD, hypothyroidism, who presented with breakthrough seizure and admitted for further workup on hospital day 0  # Left Sided Breakthrough (Focal Tonic Clonic Seizure) in setting of known brain mets from breast cancer # Hx of Breast Cancer with Mets # Hx of brain cancer s/p resection Patient presented with her usual seizure symptoms including left sided weakness and numbness as well as confusion. Family states it is very similar to past seizures. Recently increased Keppra to 1000mg  t.i.d after last seizure in June. Patients grandson tested positive for Covid-19 and patient has had a one week duration of a cough, suspect viral illness to have led to this seizure. Other possibilities include recurrence or spread of brain cancer, brain bleed, new brain lesions, stroke/tia. CT without contrast done in ER no evidence of bleeding/hemorrhagic stroke  present. Of note patient complained of a headache prior to her seizure which she has not had before. - CT done in the ED: No acute intracranial pathology. Chronic microvascular disease and cerebral atrophy. - Neuro Consulted, appreciate recommendations - Seizure precautions, overnight EEG with video monitoring, neuro monitoring, NPO - Cardiac tele monitoring - Consider if corticosteroids are warranted - Continue current antiepileptic therapy with Keppra 1G three times a day AND lacosamide 50mg  b.I.d - Continue Anastrozole therapy 1g every day - No indication for MRI at this time, consider if any new or worsening symptoms.  # Exposure to Coronavirus  # Possible Aspiration  # Cough Patients grandson recently positive with covid. Patient had 1 week of cough with green sputum now more of a dry cough. Consider CXR to rule out covid pneumonia vs possible aspiration pneumonia. - Obtain Covid19/Viral panel - Consider/obtain CXR - Airborne and droplet precautions pending viral panel - Monitor patients vitals, pulse oximetry  - Patient NPO, meds IV, assess bedside swallow and if patient fails need to convert oral medications of IV form.  Chronic Problems:  # Hypothyroidism  - Continue with home dose of levothyroxine 100 mcg po every day # HLD -Continue with home rosuvastatin 10mg  QD #Chronic Iron Def Anemia - Continue with home ferrous sulfate.  Diet: NPO VTE: Enoxaparin IVF: NS, Code: Full  Prior to Admission Living Arrangement: Home, living family Anticipated Discharge Location: Home Barriers to Discharge: Breakthrough seizure work up.  Dispo: Admit patient to Observation with expected length of stay less than 2 midnights.  Signed: Martie Round, Medical Student Internal Medicine Team  Pager [phone] 05/21/2023, 3:38 PM   Attestation for Student Documentation:  I personally was present and re-performed the history, physical exam and medical decision-making activities of this  service and have verified that the service and findings are accurately documented in the student's note.  Rocky Morel, DO 05/22/2023, 6:43 AM

## 2023-05-21 NOTE — Consult Note (Signed)
NEUROLOGY CONSULTATION NOTE   Date of service: May 21, 2023 Patient Name: Tabitha Ramirez MRN:  [id number] DOB:  Oct 26, 1945 Reason for consult: "seizure" Requesting Provider: Wynetta Fines, MD _ _ _   _ __   _ __ _ _  __ __   _ __   __ _  History of Present Illness  Tabitha Ramirez is a 77 y.o. 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  Pt called daughter at 9AM, unable to get out of bed. Family noted L sided weakness. This is typically their clue that she had a seizure. Over the last few hours has had intermittent left arm jerking. They brought her to the ED where she was bring put in the waiting room when she had episode of L arm jerking consistent with seizure. She was given Ativan 2mg  and loaded up with Keppra 1500mg  IV once.  Per family, patient takes a total of 3000mg  of Keppra over the course of a day. She likes to split it into 1000mg  three times a day. She took her morning dose of Keppra. She is compliant with her Keppra and has not missed any doses. Earlier, patient was also reporting about a headache. They report that the headache was something that she presented with when she was first diagnosed with the brain met.  She did not have any fevers, chills, signs of URI or UTI or gastroenteritis recently per daughter and son in law.  She had a CT Head w/o contrast in the ED which was negative for any acute intracranial abnormalities.  Per family, she is still weak on the left side but there seems to be slight improvement.  On my evaluation of patient, she is significantly weak on the left side with left arm eglect, R gaze deviation and L hemianopsia. Suspect this is likely post ictal but family also mentions that her seizure are sometimes subtle with no obvious signs.    ROS   Unable to obtain detailed ROS due to  somnolence.  Past History   Past Medical History:  Diagnosis Date   Allergy    Anemia    Arthritis    "joints" (09/29/2013)   Breast cancer (HCC)    GERD (gastroesophageal reflux disease)    Heart murmur    History of blood transfusion    "related to chemo/breast cancer" (09/29/2013)   Hypertension    Migraines    Personal history of chemotherapy    Personal history of radiation therapy    Radiation 11/05/13-12/24/13   Right breast    TMJ (temporomandibular joint syndrome)    "left; just dx'd" (09/29/2013   Past Surgical History:  Procedure Laterality Date   AXILLARY LYMPH NODE BIOPSY Right 03/10/2013   Procedure: AXILLARY LYMPH NODE BIOPSY;  Surgeon: Ernestene Mention, MD;  Location: [facility] OR;  Service: General;  Laterality: Right;  sentinel node with blue dye   BREAST BIOPSY     BREAST LUMPECTOMY Right 09/29/2013   needle localization w/axillary LND/notes 09/29/2013   BREAST LUMPECTOMY WITH NEEDLE LOCALIZATION AND AXILLARY LYMPH NODE DISSECTION Right 09/29/2013   Procedure: RIGHT BREAST NEEDLE LOCALIZED LUMPECTOMY AND AXILLARY LYMPH NODE DISSECTION;  Surgeon: Ernestene Mention, MD;  Location: [facility] OR;  Service: General;  Laterality: Right;   COLONOSCOPY N/A 04/30/2013   Procedure: COLONOSCOPY;  Surgeon: Graylin Shiver, MD;  Location: [facility]  ENDOSCOPY;  Service: Endoscopy;  Laterality: N/A;   CRANIOTOMY Right 03/11/2022   Procedure: CRANIOTOMY TUMOR EXCISION;  Surgeon: Venetia Night, MD;  Location: [facility] ORS;  Service: Neurosurgery;  Laterality: Right;   ESOPHAGOGASTRODUODENOSCOPY N/A 04/28/2013   Procedure: ESOPHAGOGASTRODUODENOSCOPY (EGD);  Surgeon: Graylin Shiver, MD;  Location: [facility]OSCOPY;  Service: Endoscopy;  Laterality: N/A;   MASTECTOMY Right 09/29/2013   "partial"   PORT-A-CATH REMOVAL  09/29/2013   PORT-A-CATH REMOVAL Left 09/29/2013   Procedure: REMOVAL PORT-A-CATH;  Surgeon: Ernestene Mention, MD;  Location: [facility] OR;  Service: General;  Laterality: Left;   PORTACATH PLACEMENT N/A 03/10/2013    Procedure: INSERTION PORT-A-CATH WITH FLUOROSCOPY AND ULTRASOUND;  Surgeon: Ernestene Mention, MD;  Location: [facility] OR;  Service: General;  Laterality: N/A;   SURAL NERVE BX Right 05/13/2014   Procedure: SURAL NERVE BIOPSY;  Surgeon: Hewitt Shorts, MD;  Location: [facility]UR[facility] ORS;  Service: Neurosurgery;  Laterality: Right;  Right sural nerve biopsy   TONSILLECTOMY     TOTAL ABDOMINAL HYSTERECTOMY     With bilateral salpingo-oophorectomy   Family History  Problem Relation Age of Onset   Cancer Mother 37       Unknown type of cancer   Congestive Heart Failure Mother    Breast cancer Mother    Colon cancer Brother    Diabetes Brother 36   Heart disease Brother        AMI 05/22/2012   Congestive Heart Failure Brother    Cancer Maternal Grandmother 10       Unknown type of cancer   Breast cancer Maternal Grandmother    Social History   Socioeconomic History   Marital status: Divorced    Spouse name: n/a   Number of children: 1   Years of education: 17   Highest education level: Not on file  Occupational History   Occupation: retired    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 Currently work as Lawyer   Right handed    Lives with daughter and son in law    Social Determinants of Health   Financial Resource Strain: Not on file  Food Insecurity: Not on file  Transportation Needs: Not on file  Physical Activity: Not on file  Stress: Not on file  Social  Connections: Not on file   Allergies  Allergen Reactions   Penicillins Rash    Face swelling   Nsaids Nausea And Vomiting and Other (See Comments)    Pt states stomach ulcers, vomiting, and bleeding.    Medications  (Not in a hospital admission)    Vitals   Vitals:   05/21/23 1246 05/21/23 1335 05/21/23 1345 05/21/23 1415  BP: (!) 156/84 (!) 142/78 139/75 133/76  Pulse: (!) 120 75 75 74  Resp: 18 (!) 29 (!) 28 (!) 24  Temp: 98.5 F (36.9 C)  SpO2: 100% 99% 100% 100%  Weight:      Height:         Body mass index is 23.82 kg/m.  Physical Exam   General: Laying comfortably in bed; in no acute distress. HENT: Normal oropharynx and mucosa. Normal external appearance of ears and nose.  Neck: Supple, no pain or tenderness  CV: No JVD. No peripheral edema.  Pulmonary: Symmetric Chest rise. Normal respiratory effort.  Abdomen: Soft to touch, non-tender.  Ext: No cyanosis, edema, or deformity  Skin: No rash. Normal palpation of skin.   Musculoskeletal: Normal digits and nails by inspection. No clubbing.   Neurologic Examination  Mental status/Cognition: drowsy, oriented to self, age. Poor attention and doses off. Speech/language: hypophonic, dysarthric speech, non fluent, comprehension intact to simple commands., object naming intact. Cranial nerves:   CN II Pupils equal and reactive to light, L hemianopsia.   CN III,IV,VI R gaze preference, briefly crosses midline.   CN V normal sensation in V1, V2, and V3 segments bilaterally   CN VII no asymmetry, no nasolabial fold flattening   CN VIII Makes eye contact to speech on the right only.   CN IX & X normal palatal elevation, no uvular deviation   CN XI Head to the right but no forced head turn or deviation.   CN XII midline tongue protrusion   Motor:  Muscle bulk: poor, tone normal. Mvmt Root Nerve  Muscle Right Left Comments  SA C5/6 Ax Deltoid     EF C5/6 Mc Biceps 4 3   EE C6/7/8 Rad Triceps 4 3   WF C6/7 Med  FCR     WE C7/8 PIN ECU     F Ab C8/T1 U ADM/FDI 4 3   HF L1/2/3 Fem Illopsoas 4 4   KE L2/3/4 Fem Quad     DF L4/5 D Peron Tib Ant 4 4   PF S1/2 Tibial Grc/Sol 4 4    Sensation:  Light touch Absent on the left vs sensory neglect.   Pin prick    Temperature    Vibration   Proprioception    Coordination/Complex Motor:  - Finger to Nose intact BL - Heel to shin unable to do - Rapid alternating movement unable to assess - Gait: deferred for patient safety.  Labs   CBC:  Recent Labs  Lab 05/21/23 1255  WBC 10.2  HGB 10.4*  HCT 31.9*  MCV 97.9  PLT 272    Basic Metabolic Panel:  Lab Results  Component Value Date   NA 138 05/21/2023   K 3.7 05/21/2023   CO2 24 05/21/2023   GLUCOSE 113 (H) 05/21/2023   BUN 12 05/21/2023   CREATININE 0.88 05/21/2023   CALCIUM 9.7 05/21/2023   GFRNONAA >60 05/21/2023   GFRAA 85 (L) 05/12/2014   Lipid Panel:  Lab Results  Component Value Date   LDLCALC 87 11/29/2011   HgbA1c:  Lab Results  Component Value Date   HGBA1C 7.1 (H) 04/27/2013   Urine Drug Screen: No results found for: "LABOPIA", "COCAINSCRNUR", "LABBENZ", "AMPHETMU", "THCU", "LABBARB"  Alcohol Level     Component Value Date/Time   ETH <10 02/10/2023 1940    CT Head without contrast(Personally reviewed): CTH was negative for a large hypodensity concerning for a large territory infarct or hyperdensity concerning for an ICH. Prior R parietal craniotomy with noted R parietal lobe encephalomalacia.  cEEG:  pending  Impression   DESIA CAPP is a 77 y.o. 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  She is compliant with her Keppra and takes 1000mg  TID at home. No obvious provoking risk factors, no new medications.  Presentation is most  concerning for breakthrough seizures. However, family endorses that this AM, she woke up with left sided weakness with no noted clinical seizure activity, raising possibility of subclinical seizures. I suspect that most likely, she has post ictal todds with Left sided weakness, neglect and R gaze deviation but would still like to put her up on LTM to ensure she is not having subclinical seizures.  Also since she is on max dose of Keppra, will load her up with Vimpat and start her on maintenance Vimpat.  Recommendations  - cont Keppra 1000mg  TID(pt prefers to take her Keppra TID at home) - Vimpat 200mg  IV once, followed by 50mg  BID - LTM EEG. - observe overnight to monitor for seizure clustering in the setting of prolonged seizure and post ictal encephalopathy. - Ativan 1-2mg  for seizure lasting more than 3 mins. - seizure precautions with seizure pads. ______________________________________________________________________   Thank you for the opportunity to take part in the care of this patient. If you have any further questions, please contact the neurology consultation attending.  Signed,  Erick Blinks [facility] _ _ _   _ __   _ __ _ _  __ __   _ __   __ _

## 2023-05-21 NOTE — ED Provider Notes (Signed)
[facility] EMERGENCY DEPARTMENT AT [facility] Provider Note   CSN: [id number] Arrival date & time: 05/21/23  1236     History  Chief Complaint  Patient presents with   Seizures    Tabitha Ramirez is a 77 y.o. female.  77 y.o. female with medical history significant for metastatic right breast cancer with brain metastasis on seizure prophylaxis Keppra due to high risk for seizures, hyperlipidemia, hypothyroidism, polyneuropathy presents with seizure.  Son provides history.  Patient with recurrent left sided seizure activity that began around 9 AM.  Patient's symptoms were intermittent for the course of the morning.  Symptoms worsened and the patient family decided to bring her to the ED for evaluation.  On arrival the patient is having partial seizure with tonic-clonic movement of the left upper extremity.  Patient is able to [facility] and answer questions appropriately.  Patient was given an a.m. dose of 1000 mg of Keppra by family at approximately 9 AM per report.   The history is provided by the patient, medical records and a relative.       Home Medications Prior to Admission medications   Medication Sig Start Date End Date Taking? Authorizing Provider  acetaminophen (TYLENOL) 325 MG tablet Take 1-2 tablets (325-650 mg total) by mouth every 6 (six) hours as needed for mild pain. 02/13/23   Elgergawy, Leana Roe, MD  anastrozole (ARIMIDEX) 1 MG tablet TAKE 1 TABLET(1 MG) BY MOUTH DAILY 04/26/23   Serena Croissant, MD  Cholecalciferol (D3-1000) 25 MCG (1000 UT) capsule Take 1 capsule (1,000 Units total) by mouth daily. 03/27/22   Angiulli, Mcarthur Rossetti, PA-C  levETIRAcetam (KEPPRA) 750 MG tablet Take 2 tablets (1,500 mg total) by mouth 2 (two) times daily. 04/05/23   Leroy Sea, MD  levothyroxine (SYNTHROID) 50 MCG tablet Take 1 tablet (50 mcg total) by mouth daily. 04/26/22   Jones Bales, NP  LORazepam (ATIVAN) 0.5 MG tablet Take 1 tablet as needed for seizure. Do not take  more than 3 a week. 04/05/23   Leroy Sea, MD  Methylcobalamin (B12-ACTIVE PO) Take 1 tablet by mouth daily.    [provider]  oxyCODONE-acetaminophen (PERCOCET/ROXICET) 5-325 MG tablet Take 1 tablet by mouth every 6 (six) hours as needed for severe pain. 05/17/23   Pickenpack-Cousar, Arty Baumgartner, NP  potassium chloride (KLOR-CON) 10 MEQ tablet Take 10 mEq by mouth daily. 03/10/22   [provider]  rosuvastatin (CRESTOR) 10 MG tablet Take 1 tablet (10 mg total) by mouth daily. 04/26/22   Jones Bales, NP  triamterene-hydrochlorothiazide (MAXZIDE-25) 37.5-25 MG tablet Take 1 tablet by mouth daily. 03/24/23   [provider]  vitamin B-12 (CYANOCOBALAMIN) 1000 MCG tablet Take 1 tablet (1,000 mcg total) by mouth daily. 03/27/22   Angiulli, Mcarthur Rossetti, PA-C      Allergies    Penicillins and Nsaids    Review of Systems   Review of Systems  Unable to perform ROS: Acuity of condition    Physical Exam Updated Vital Signs BP (!) 156/84   Pulse (!) 120   Temp 98.5 F (36.9 C)   Resp 18   Ht 5\' 3"  (1.6 m)   Wt 61 kg   SpO2 100%   BMI 23.82 kg/m  Physical Exam Vitals and nursing note reviewed.  Constitutional:      General: She is not in acute distress.    Appearance: She is well-developed.     Comments: Alert, active partial seizure with tonic-clonic  movements of left upper extremity noted.  HENT:     Head: Normocephalic and atraumatic.  Eyes:     Conjunctiva/sclera: Conjunctivae normal.     Pupils: Pupils are equal, round, and reactive to light.  Cardiovascular:     Rate and Rhythm: Normal rate and regular rhythm.     Heart sounds: Normal heart sounds.  Pulmonary:     Effort: Pulmonary effort is normal. No respiratory distress.     Breath sounds: Normal breath sounds.  Abdominal:     General: There is no distension.     Palpations: Abdomen is soft.     Tenderness: There is no abdominal tenderness.  Musculoskeletal:        General: No deformity.  Normal range of motion.     Cervical back: Normal range of motion and neck supple.  Skin:    General: Skin is warm and dry.  Neurological:     General: No focal deficit present.     Mental Status: She is alert.     Comments: Alert, interactive, answers simple questions with yes or no answers.  Tonic-clonic movement of left upper extremity and shoulder noted.     ED Results / Procedures / Treatments   Labs (all labs ordered are listed, but only abnormal results are displayed) Labs Reviewed  CBC - Abnormal; Notable for the following components:      Result Value   RBC 3.26 (*)    Hemoglobin 10.4 (*)    HCT 31.9 (*)    All other components within normal limits  BASIC METABOLIC PANEL  LEVETIRACETAM LEVEL  CBG MONITORING, ED    EKG EKG Interpretation Date/Time:  Monday May 21 2023 12:55:01 EDT Ventricular Rate:  79 PR Interval:  160 QRS Duration:  126 QT Interval:  402 QTC Calculation: 460 R Axis:   -14  Text Interpretation: Normal sinus rhythm Right bundle branch block Abnormal ECG When compared with ECG of 02-Apr-2023 18:13, PREVIOUS ECG IS PRESENT Confirmed by Kristine Royal [phone]) on 05/21/2023 1:27:32 PM  Radiology No results found.  Procedures Procedures    Medications Ordered in ED Medications  levETIRAcetam (KEPPRA) IVPB 1500 mg/ 100 mL premix (has no administration in time range)  ondansetron (ZOFRAN) injection 4 mg (has no administration in time range)  LORazepam (ATIVAN) injection 2 mg (2 mg Intravenous Given 05/21/23 1327)    ED Course/ Medical Decision Making/ A&P                                 Medical Decision Making Amount and/or Complexity of Data Reviewed Labs: ordered. Radiology: ordered.  Risk Prescription drug management. Decision regarding hospitalization.    Medical Screen Complete  This patient presented to the ED with complaint of seizure.  This complaint involves an extensive number of treatment options. The initial  differential diagnosis includes, but is not limited to, status epilepticus, metabolic abnormality, medication noncompliance, etc.  This presentation is: Acute, Chronic, Self-Limited, Previously Undiagnosed, Uncertain Prognosis, Complicated, Systemic Symptoms, and Threat to Life/Bodily Function  Patient with known history of seizure disorder.  Per family, patient with multiple episodes of seizure activity throughout the morning.  Patient was given 1000 mg of Keppra this morning.  Patient has otherwise been in her normal state of health.  Patient given Ativan and Keppra load on arrival to the ED given active seizure activity on arrival.  Workup on the whole is without significant acute abnormality.  Benzodiazepines appeared to have stopped the patient's witnessed seizure activity.  Case discussed briefly with neurology.  They agree with plan for admission for seizure monitoring.  Hospitalist service aware of case and will evaluate for admission.   Additional history obtained: External records from outside sources obtained and reviewed including prior ED visits and prior Inpatient records.   Problem List / ED Course:  Seizure   Reevaluation:  After the interventions noted above, I reevaluated the patient and found that they have: improved   Disposition:  After consideration of the diagnostic results and the patients response to treatment, I feel that the patent would benefit from admission.   CRITICAL CARE Performed by: Wynetta Fines   Total critical care time: 30 minutes  Critical care time was exclusive of separately billable procedures and treating other patients.  Critical care was necessary to treat or prevent imminent or life-threatening deterioration.  Critical care was time spent personally by me on the following activities: development of treatment plan with patient and/or surrogate as well as nursing, discussions with consultants, evaluation of patient's  response to treatment, examination of patient, obtaining history from patient or surrogate, ordering and performing treatments and interventions, ordering and review of laboratory studies, ordering and review of radiographic studies, pulse oximetry and re-evaluation of patient's condition.         Final Clinical Impression(s) / ED Diagnoses Final diagnoses:  Seizure [facility])    Rx / DC Orders ED Discharge Orders     None         Wynetta Fines, MD 05/21/23 [phone]

## 2023-05-21 NOTE — ED Notes (Signed)
ED TO INPATIENT HANDOFF REPORT  ED Nurse Name and Phone #: Nehemiah Settle 1913  S Name/Age/Gender Tabitha Ramirez 77 y.o. female Room/Bed: 039C/039C  Code Status   Code Status: Full Code  Home/SNF/Other Home Patient oriented to: self place, situation, time Is this baseline? Yes   Triage Complete: Triage complete  Chief Complaint Seizure [facility]) [R56.9]  Triage Note Pt's son dropped pt off and stated she had a seizure today. Pt's son stated she lost function in left side while trying to get up and got confused during seizure. Pt's son states the side became heavy and those are the sx when she has a seizure. Pt states she has nausea and a HA. Pt is A&Ox4. Pt is slow to answer   Allergies Allergies  Allergen Reactions   Penicillins Rash    Face swelling   Nsaids Nausea And Vomiting and Other (See Comments)    Pt states stomach ulcers, vomiting, and bleeding.    Level of Care/Admitting Diagnosis ED Disposition     ED Disposition  Admit   Condition  --   Comment  Hospital Area: [facility] [[id number]]  Level of Care: Telemetry Medical [104]  May place patient in observation at [facility] or [facility] if equivalent level of care is available:: Yes  Covid Evaluation: Symptomatic Person Under Investigation (PUI) or recent exposure (last 10 days) *Testing Required*  Diagnosis: Seizure (HCC) [205090]  Admitting Physician: Silvio Pate  Attending Physician: Gust Rung [[id number]]          B Medical/Surgery History Past Medical History:  Diagnosis Date   Allergy    Anemia    Arthritis    "joints" (09/29/2013)   Breast cancer (HCC)    GERD (gastroesophageal reflux disease)    Heart murmur    History of blood transfusion    "related to chemo/breast cancer" (09/29/2013)   Hypertension    Migraines    Personal history of chemotherapy    Personal history of radiation therapy    Radiation 11/05/13-12/24/13   Right breast    TMJ (temporomandibular joint  syndrome)    "left; just dx'd" (09/29/2013   Past Surgical History:  Procedure Laterality Date   AXILLARY LYMPH NODE BIOPSY Right 03/10/2013   Procedure: AXILLARY LYMPH NODE BIOPSY;  Surgeon: Ernestene Mention, MD;  Location: [facility] OR;  Service: General;  Laterality: Right;  sentinel node with blue dye   BREAST BIOPSY     BREAST LUMPECTOMY Right 09/29/2013   needle localization w/axillary LND/notes 09/29/2013   BREAST LUMPECTOMY WITH NEEDLE LOCALIZATION AND AXILLARY LYMPH NODE DISSECTION Right 09/29/2013   Procedure: RIGHT BREAST NEEDLE LOCALIZED LUMPECTOMY AND AXILLARY LYMPH NODE DISSECTION;  Surgeon: Ernestene Mention, MD;  Location: [facility] OR;  Service: General;  Laterality: Right;   COLONOSCOPY N/A 04/30/2013   Procedure: COLONOSCOPY;  Surgeon: Graylin Shiver, MD;  Location: [facility] ENDOSCOPY;  Service: Endoscopy;  Laterality: N/A;   CRANIOTOMY Right 03/11/2022   Procedure: CRANIOTOMY TUMOR EXCISION;  Surgeon: Venetia Night, MD;  Location: [facility] ORS;  Service: Neurosurgery;  Laterality: Right;   ESOPHAGOGASTRODUODENOSCOPY N/A 04/28/2013   Procedure: ESOPHAGOGASTRODUODENOSCOPY (EGD);  Surgeon: Graylin Shiver, MD;  Location: [facility];  Service: Endoscopy;  Laterality: N/A;   MASTECTOMY Right 09/29/2013   "partial"   PORT-A-CATH REMOVAL  09/29/2013   PORT-A-CATH REMOVAL Left 09/29/2013   Procedure: REMOVAL PORT-A-CATH;  Surgeon: Ernestene Mention, MD;  Location: [facility] OR;  Service: General;  Laterality: Left;   PORTACATH PLACEMENT  N/A 03/10/2013   Procedure: INSERTION PORT-A-CATH WITH FLUOROSCOPY AND ULTRASOUND;  Surgeon: Ernestene Mention, MD;  Location: [facility] OR;  Service: General;  Laterality: N/A;   SURAL NERVE BX Right 05/13/2014   Procedure: SURAL NERVE BIOPSY;  Surgeon: Hewitt Shorts, MD;  Location: [facility] NEURO ORS;  Service: Neurosurgery;  Laterality: Right;  Right sural nerve biopsy   TONSILLECTOMY     TOTAL ABDOMINAL HYSTERECTOMY     With bilateral salpingo-oophorectomy     A IV  Location/Drains/Wounds Patient Lines/Drains/Airways Status     Active Line/Drains/Airways     Name Placement date Placement time Site Days   Peripheral IV 05/21/23 20 G Posterior;Right Hand 05/21/23  1326  Hand  less than 1            Intake/Output Last 24 hours No intake or output data in the 24 hours ending 05/21/23 1636  Labs/Imaging Results for orders placed or performed during the hospital encounter of 05/21/23 (from the past 48 hour(s))  Basic metabolic panel - if new onset seizures     Status: Abnormal   Collection Time: 05/21/23 12:55 PM  Result Value Ref Range   Sodium 138 135 - 145 mmol/L   Potassium 3.7 3.5 - 5.1 mmol/L   Chloride 103 98 - 111 mmol/L   CO2 24 22 - 32 mmol/L   Glucose, Bld 113 (H) 70 - 99 mg/dL    Comment: Glucose reference range applies only to samples taken after fasting for at least 8 hours.   BUN 12 8 - 23 mg/dL   Creatinine, Ser 0.86 0.44 - 1.00 mg/dL   Calcium 9.7 8.9 - 57.8 mg/dL   GFR, Estimated >46 >96 mL/min    Comment: (NOTE) Calculated using the CKD-EPI Creatinine Equation (2021)    Anion gap 11 5 - 15    Comment: Performed at [facility], [address], [place], [place] [address]  CBC - if new onset seizures     Status: Abnormal   Collection Time: 05/21/23 12:55 PM  Result Value Ref Range   WBC 10.2 4.0 - 10.5 K/uL   RBC 3.26 (L) 3.87 - 5.11 MIL/uL   Hemoglobin 10.4 (L) 12.0 - 15.0 g/dL   HCT 41.3 (L) 24.4 - 01.0 %   MCV 97.9 80.0 - 100.0 fL   MCH 31.9 26.0 - 34.0 pg   MCHC 32.6 30.0 - 36.0 g/dL   RDW 27.2 53.6 - 64.4 %   Platelets 272 150 - 400 K/uL   nRBC 0.0 0.0 - 0.2 %    Comment: Performed at [facility], [address] [place], [place] [address]  CBG monitoring, ED     Status: None   Collection Time: 05/21/23  1:02 PM  Result Value Ref Range   Glucose-Capillary 96 70 - 99 mg/dL    Comment: Glucose reference range applies only to samples taken after fasting for at least 8 hours.   CT Head Wo  Contrast  Result Date: 05/21/2023 CLINICAL DATA:  Mental status change EXAM: CT HEAD WITHOUT CONTRAST TECHNIQUE: Contiguous axial images were obtained from the base of the skull through the vertex without intravenous contrast. RADIATION DOSE REDUCTION: This exam was performed according to the departmental dose-optimization program which includes automated exposure control, adjustment of the mA and/or kV according to patient size and/or use of iterative reconstruction technique. COMPARISON:  04/02/2023, 02/10/2023 FINDINGS: Brain: No evidence of acute infarction, hemorrhage, extra-axial collection, ventriculomegaly, or mass effect. Right parietal encephalomalacia. Generalized  cerebral atrophy. Periventricular white matter low attenuation likely secondary to microangiopathy. Vascular: Cerebrovascular atherosclerotic calcifications are noted. Skull: Negative for fracture or focal lesion. Prior right parietal craniotomy. Sinuses/Orbits: Visualized portions of the orbits are unremarkable. Visualized portions of the paranasal sinuses are unremarkable. Visualized portions of the mastoid air cells are unremarkable. Other: None. IMPRESSION: 1. No acute intracranial pathology. 2. Chronic microvascular disease and cerebral atrophy. Electronically Signed   By: Elige Ko M.D.   On: 05/21/2023 14:53    Pending Labs Unresulted Labs (From admission, onward)     Start     Ordered   05/21/23 1635  SARS Coronavirus 2 by RT PCR (hospital order, performed in [facility] hospital lab) *cepheid single result test* Anterior Nasal Swab  (Tier 2 - SARS Coronavirus 2 by RT PCR (hospital order, performed in [facility] hospital lab) *cepheid single result test*)  Once,   R        05/21/23 1634   05/21/23 1633  Respiratory (~20 pathogens) panel by PCR  (Respiratory panel by PCR (~20 pathogens, ~24 hr TAT)  w precautions)  Once,   R        05/21/23 1633   05/21/23 1250  Levetiracetam level  Once,   URGENT        05/21/23 1250             Vitals/Pain Today's Vitals   05/21/23 1335 05/21/23 1345 05/21/23 1415 05/21/23 1630  BP: (!) 142/78 139/75 133/76   Pulse: 75 75 74 73  Resp: (!) 29 (!) 28 (!) 24 20  Temp:      SpO2: 99% 100% 100% 100%  Weight:      Height:      PainSc:        Isolation Precautions Airborne and Contact precautions  Medications Medications  enoxaparin (LOVENOX) injection 40 mg (has no administration in time range)  LORazepam (ATIVAN) injection 2 mg (2 mg Intravenous Given 05/21/23 1327)  levETIRAcetam (KEPPRA) IVPB 1500 mg/ 100 mL premix (0 mg Intravenous Stopped 05/21/23 1415)  ondansetron (ZOFRAN) injection 4 mg (4 mg Intravenous Given 05/21/23 1340)    Mobility walks     Focused Assessments    R Recommendations: See Admitting Provider Note  Report given to:   Additional Notes:

## 2023-05-21 NOTE — Hospital Course (Addendum)
Seizures all morning Starts in arm tonic clonic then becomes generalized Got less keppra this am Got ativan and keppra in ed   Brain surgery 1 year ago, since then seizures Breast cancer 10 years ago   Gets confused, left sided dysfunction,   Also had a headache  Had one in June Or last year and went to [facility]  Status since 10 am   Has uri, cough productive    Usually independent uses cane Lives with duaghter and sil  Vomiting in ed is not consistent with prior seizures

## 2023-05-21 NOTE — ED Notes (Signed)
Patient transported to CT 

## 2023-05-21 NOTE — Progress Notes (Signed)
LTM EEG hooked up and running - no initial skin breakdown - push button tested - [facility] monitoring.  

## 2023-05-21 NOTE — Progress Notes (Signed)
Patient is moving upstairs soon.

## 2023-05-21 NOTE — ED Triage Notes (Addendum)
Pt's son dropped pt off and stated she had a seizure today. Pt's son stated she lost function in left side while trying to get up and got confused during seizure. Pt's son states the side became heavy and those are the sx when she has a seizure. Pt states she has nausea and a HA. Pt is A&Ox4. Pt is slow to answer

## 2023-05-21 NOTE — ED Notes (Signed)
As I was getting ready to put pt in the waiting room, pt started having a seizure. Pt's left side of body was shaking and pt was vomiting. Pt placed in exam room and Dr Rodena Medin notified.

## 2023-05-22 ENCOUNTER — Other Ambulatory Visit: Payer: Self-pay | Admitting: Radiation Therapy

## 2023-05-22 DIAGNOSIS — Z8249 Family history of ischemic heart disease and other diseases of the circulatory system: Secondary | ICD-10-CM | POA: Diagnosis not present

## 2023-05-22 DIAGNOSIS — E039 Hypothyroidism, unspecified: Secondary | ICD-10-CM | POA: Diagnosis present

## 2023-05-22 DIAGNOSIS — Z79899 Other long term (current) drug therapy: Secondary | ICD-10-CM | POA: Diagnosis not present

## 2023-05-22 DIAGNOSIS — Z886 Allergy status to analgesic agent status: Secondary | ICD-10-CM | POA: Diagnosis not present

## 2023-05-22 DIAGNOSIS — I4510 Unspecified right bundle-branch block: Secondary | ICD-10-CM | POA: Diagnosis present

## 2023-05-22 DIAGNOSIS — Z880 Allergy status to penicillin: Secondary | ICD-10-CM | POA: Diagnosis not present

## 2023-05-22 DIAGNOSIS — E876 Hypokalemia: Secondary | ICD-10-CM | POA: Diagnosis present

## 2023-05-22 DIAGNOSIS — F410 Panic disorder [episodic paroxysmal anxiety] without agoraphobia: Secondary | ICD-10-CM | POA: Diagnosis present

## 2023-05-22 DIAGNOSIS — K219 Gastro-esophageal reflux disease without esophagitis: Secondary | ICD-10-CM | POA: Diagnosis present

## 2023-05-22 DIAGNOSIS — Z9221 Personal history of antineoplastic chemotherapy: Secondary | ICD-10-CM | POA: Diagnosis not present

## 2023-05-22 DIAGNOSIS — Z85841 Personal history of malignant neoplasm of brain: Secondary | ICD-10-CM | POA: Diagnosis not present

## 2023-05-22 DIAGNOSIS — Z79811 Long term (current) use of aromatase inhibitors: Secondary | ICD-10-CM | POA: Diagnosis not present

## 2023-05-22 DIAGNOSIS — Z853 Personal history of malignant neoplasm of breast: Secondary | ICD-10-CM | POA: Diagnosis not present

## 2023-05-22 DIAGNOSIS — G40909 Epilepsy, unspecified, not intractable, without status epilepticus: Secondary | ICD-10-CM | POA: Diagnosis present

## 2023-05-22 DIAGNOSIS — Z79890 Hormone replacement therapy (postmenopausal): Secondary | ICD-10-CM | POA: Diagnosis not present

## 2023-05-22 DIAGNOSIS — Z9011 Acquired absence of right breast and nipple: Secondary | ICD-10-CM | POA: Diagnosis not present

## 2023-05-22 DIAGNOSIS — Z8616 Personal history of COVID-19: Secondary | ICD-10-CM | POA: Diagnosis not present

## 2023-05-22 DIAGNOSIS — Z87891 Personal history of nicotine dependence: Secondary | ICD-10-CM | POA: Diagnosis not present

## 2023-05-22 DIAGNOSIS — Z923 Personal history of irradiation: Secondary | ICD-10-CM | POA: Diagnosis not present

## 2023-05-22 DIAGNOSIS — Z20822 Contact with and (suspected) exposure to covid-19: Secondary | ICD-10-CM | POA: Diagnosis present

## 2023-05-22 DIAGNOSIS — R569 Unspecified convulsions: Secondary | ICD-10-CM | POA: Diagnosis present

## 2023-05-22 DIAGNOSIS — I10 Essential (primary) hypertension: Secondary | ICD-10-CM | POA: Diagnosis present

## 2023-05-22 DIAGNOSIS — D509 Iron deficiency anemia, unspecified: Secondary | ICD-10-CM | POA: Diagnosis present

## 2023-05-22 DIAGNOSIS — E785 Hyperlipidemia, unspecified: Secondary | ICD-10-CM | POA: Diagnosis present

## 2023-05-22 LAB — CBC WITH DIFFERENTIAL/PLATELET
Abs Immature Granulocytes: 0.02 10*3/uL (ref 0.00–0.07)
Basophils Absolute: 0 10*3/uL (ref 0.0–0.1)
Basophils Relative: 1 %
Eosinophils Absolute: 0.1 10*3/uL (ref 0.0–0.5)
Eosinophils Relative: 2 %
HCT: 28.8 % — ABNORMAL LOW (ref 36.0–46.0)
Hemoglobin: 9.8 g/dL — ABNORMAL LOW (ref 12.0–15.0)
Immature Granulocytes: 0 %
Lymphocytes Relative: 28 %
Lymphs Abs: 2.1 10*3/uL (ref 0.7–4.0)
MCH: 32.5 pg (ref 26.0–34.0)
MCHC: 34 g/dL (ref 30.0–36.0)
MCV: 95.4 fL (ref 80.0–100.0)
Monocytes Absolute: 0.6 10*3/uL (ref 0.1–1.0)
Monocytes Relative: 9 %
Neutro Abs: 4.6 10*3/uL (ref 1.7–7.7)
Neutrophils Relative %: 60 %
Platelets: 270 10*3/uL (ref 150–400)
RBC: 3.02 MIL/uL — ABNORMAL LOW (ref 3.87–5.11)
RDW: 12.6 % (ref 11.5–15.5)
WBC: 7.5 10*3/uL (ref 4.0–10.5)
nRBC: 0 % (ref 0.0–0.2)

## 2023-05-22 LAB — GLUCOSE, CAPILLARY
Glucose-Capillary: 65 mg/dL — ABNORMAL LOW (ref 70–99)
Glucose-Capillary: 157 mg/dL — ABNORMAL HIGH (ref 70–99)
Glucose-Capillary: 106 mg/dL — ABNORMAL HIGH (ref 70–99)
Glucose-Capillary: 99 mg/dL (ref 70–99)
Glucose-Capillary: 125 mg/dL — ABNORMAL HIGH (ref 70–99)
Glucose-Capillary: 137 mg/dL — ABNORMAL HIGH (ref 70–99)

## 2023-05-22 LAB — RENAL FUNCTION PANEL
Albumin: 3.1 g/dL — ABNORMAL LOW (ref 3.5–5.0)
Anion gap: 12 (ref 5–15)
BUN: 11 mg/dL (ref 8–23)
CO2: 24 mmol/L (ref 22–32)
Calcium: 9 mg/dL (ref 8.9–10.3)
Chloride: 103 mmol/L (ref 98–111)
Creatinine, Ser: 0.98 mg/dL (ref 0.44–1.00)
GFR, Estimated: >60 mL/min (ref >60)
Glucose, Bld: 65 mg/dL — ABNORMAL LOW (ref 70–99)
Phosphorus: 3.4 mg/dL (ref 2.5–4.6)
Potassium: 3.2 mmol/L — ABNORMAL LOW (ref 3.5–5.1)
Sodium: 139 mmol/L (ref 135–145)

## 2023-05-22 LAB — MAGNESIUM: Magnesium: 1.6 mg/dL — ABNORMAL LOW (ref 1.7–2.4)

## 2023-05-22 LAB — LEVETIRACETAM LEVEL: Levetiracetam Lvl: 54.7 ug/mL — ABNORMAL HIGH (ref 10.0–40.0)

## 2023-05-22 MED ORDER — LEVETIRACETAM 500 MG PO TABS
1000.0000 mg | ORAL_TABLET | Freq: Three times a day (TID) | ORAL | Status: DC
  Administered 2023-05-22 – 2023-05-25 (×9): 1000 mg via ORAL
  Filled 2023-05-22 (×9): qty 2

## 2023-05-22 MED ORDER — DEXTROSE 50 % IV SOLN
12.5000 g | INTRAVENOUS | Status: AC
  Administered 2023-05-22: 12.5 g via INTRAVENOUS
  Filled 2023-05-22: qty 50

## 2023-05-22 MED ORDER — POTASSIUM CHLORIDE 10 MEQ/100ML IV SOLN
10.0000 meq | INTRAVENOUS | Status: AC
  Administered 2023-05-22 (×2): 10 meq via INTRAVENOUS
  Filled 2023-05-22 (×2): qty 100

## 2023-05-22 MED ORDER — POTASSIUM CHLORIDE CRYS ER 20 MEQ PO TBCR
40.0000 meq | EXTENDED_RELEASE_TABLET | Freq: Once | ORAL | Status: AC
  Administered 2023-05-22: 40 meq via ORAL
  Filled 2023-05-22: qty 2

## 2023-05-22 MED ORDER — ENSURE ENLIVE PO LIQD
237.0000 mL | Freq: Two times a day (BID) | ORAL | Status: DC
  Administered 2023-05-23 – 2023-05-25 (×4): 237 mL via ORAL

## 2023-05-22 MED ORDER — MAGNESIUM SULFATE 2 GM/50ML IV SOLN
2.0000 g | Freq: Once | INTRAVENOUS | Status: AC
  Administered 2023-05-22: 2 g via INTRAVENOUS
  Filled 2023-05-22: qty 50

## 2023-05-22 NOTE — Plan of Care (Signed)

## 2023-05-22 NOTE — Evaluation (Signed)
Physical Therapy Evaluation Patient Details Name: Tabitha Ramirez MRN: [id number] DOB: [birth year] Today's Date: 05/22/2023  History of Present Illness  77 y.o. female presents to [facility] hospital on 05/21/2023 after witnessed seizure. Pt also found to be COVID+. PMH includes breast CA with brain mets s/p resection, HTN, HLD, hypothyroidism.  Clinical Impression  Pt presents to PT with deficits in functional mobility, gait, balance, cognition, coordination, tone. Pt with significant LUE coordination deficits, with impairment in gross and fine motor tasks noted. Pt requires assistance to stand at this time and to maintain balance due to tendency for left lateral lean. Pt is at a high risk for falls and is far from her baseline of ambulating at a modI level and performing most ADLs with limited assistance. PT recommends high intensity inpatient PT services at the time of discharge.        If plan is discharge home, recommend the following: A lot of help with walking and/or transfers;A lot of help with bathing/dressing/bathroom;Assistance with cooking/housework;Assistance with feeding;Assist for transportation;Direct supervision/assist for medications management;Direct supervision/assist for financial management;Help with stairs or ramp for entrance;Supervision due to cognitive status   Can travel by private vehicle        Equipment Recommendations  (TBD pending progress)  Recommendations for Other Services  Rehab consult    Functional Status Assessment Patient has had a recent decline in their functional status and demonstrates the ability to make significant improvements in function in a reasonable and predictable amount of time.     Precautions / Restrictions Precautions Precautions: Fall Precaution Comments: EEG, seizure Restrictions Weight Bearing Restrictions: No      Mobility  Bed Mobility Overal bed mobility: Needs Assistance Bed Mobility: Supine to Sit, Sit to Supine     Supine  to sit: Min assist, HOB elevated Sit to supine: Mod assist   General bed mobility comments: increased time, impaired coordination of LUE with attempts to push to scoot forward    Transfers Overall transfer level: Needs assistance Equipment used: 1 person hand held assist Transfers: Sit to/from Stand Sit to Stand: Mod assist           General transfer comment: assist required due to LE power deficits    Ambulation/Gait                  Stairs            Wheelchair Mobility     Tilt Bed    Modified Rankin (Stroke Patients Only)       Balance Overall balance assessment: Needs assistance Sitting-balance support: No upper extremity supported, Feet supported Sitting balance-Leahy Scale: Fair     Standing balance support: Single extremity supported Standing balance-Leahy Scale: Poor Standing balance comment: minA                             Pertinent Vitals/Pain Pain Assessment Pain Assessment: No/denies pain    Home Living Family/patient expects to be discharged to:: Private residence Living Arrangements: Children;Other (Comment) (dtr, SIL, grandson) Available Help at Discharge: Family;Available 24 hours/day Type of Home: House Home Access: Stairs to enter Entrance Stairs-Rails: Can reach both Entrance Stairs-Number of Steps: 2   Home Layout: One level Home Equipment: Agricultural consultant (2 wheels);Cane - single point;BSC/3in1;Grab bars - tub/shower;Hand held shower head;Tub bench      Prior Function Prior Level of Function : Needs assist  Mobility Comments: reports that she uses her cane in the community and RW vs cane in the home ADLs Comments: Due to neuropathy, pt does need help opening tight or new jars. Reports she completes all BADL tasks without assistance.     Extremity/Trunk Assessment   Upper Extremity Assessment Upper Extremity Assessment: RUE deficits/detail;LUE deficits/detail RUE Deficits / Details:  generalized weakness LUE Deficits / Details: ataxia, ROM WFL LUE Coordination: decreased fine motor;decreased gross motor    Lower Extremity Assessment Lower Extremity Assessment: Generalized weakness    Cervical / Trunk Assessment Cervical / Trunk Assessment: Normal  Communication   Communication Communication: No apparent difficulties Cueing Techniques: Verbal cues  Cognition Arousal: Alert Behavior During Therapy: WFL for tasks assessed/performed Overall Cognitive Status: Impaired/Different from baseline Area of Impairment: Awareness, Safety/judgement, Problem solving                         Safety/Judgement: Decreased awareness of safety, Decreased awareness of deficits Awareness: Emergent Problem Solving: Slow processing, Difficulty sequencing          General Comments General comments (skin integrity, edema, etc.): VSS on RA    Exercises     Assessment/Plan    PT Assessment Patient needs continued PT services  PT Problem List Decreased strength;Decreased activity tolerance;Decreased balance;Decreased mobility;Decreased coordination;Decreased cognition;Decreased knowledge of use of DME;Decreased safety awareness;Decreased knowledge of precautions;Impaired tone       PT Treatment Interventions DME instruction;Gait training;Functional mobility training;Stair training;Therapeutic activities;Balance training;Neuromuscular re-education;Cognitive remediation;Patient/family education;Therapeutic exercise    PT Goals (Current goals can be found in the Care Plan section)  Acute Rehab PT Goals Patient Stated Goal: to return to prior level of function PT Goal Formulation: With patient Time For Goal Achievement: 06/05/23 Potential to Achieve Goals: Fair    Frequency Min 1X/week     Co-evaluation               AM-PAC PT "6 Clicks" Mobility  Outcome Measure Help needed turning from your back to your side while in a flat bed without using bedrails?: A  Little Help needed moving from lying on your back to sitting on the side of a flat bed without using bedrails?: A Lot Help needed moving to and from a bed to a chair (including a wheelchair)?: A Lot Help needed standing up from a chair using your arms (e.g., wheelchair or bedside chair)?: A Lot Help needed to walk in hospital room?: Total Help needed climbing 3-5 steps with a railing? : Total 6 Click Score: 11    End of Session   Activity Tolerance: Patient tolerated treatment well Patient left: in bed;with call bell/phone within reach;with bed alarm set Nurse Communication: Mobility status PT Visit Diagnosis: Other abnormalities of gait and mobility (R26.89);Muscle weakness (generalized) (M62.81);Other symptoms and signs involving the nervous system (R29.898)    Time: 1426-1450 PT Time Calculation (min) (ACUTE ONLY): 24 min   Charges:   PT Evaluation $PT Eval Moderate Complexity: 1 Mod   PT General Charges $$ ACUTE PT VISIT: 1 Visit         Arlyss Gandy, PT, DPT Acute Rehabilitation Office [phone]   Arlyss Gandy 05/22/2023, 3:15 PM

## 2023-05-22 NOTE — Progress Notes (Signed)
NEUROLOGY CONSULTATION PROGRESS NOTE   Date of service: May 22, 2023 Patient Name: Tabitha Ramirez MRN:  [id number] DOB:  18-Dec-1945  Brief HPI  Tabitha Ramirez is a 77 y.o. 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   She is compliant with her Keppra and takes 1000mg  TID at home. She tested positive for COVID 19 which could have been a trigger.   Interval Hx   LTM with no seizures but frequent PLEDs/BIRDS. Clinically she is improved today. More awake, moving her left side more. Still has visual extinction on the left but will track me on the left and count fingers.  Vitals   Vitals:   05/21/23 2015 05/21/23 2331 05/22/23 0300 05/22/23 0500  BP: 130/74 132/80 126/78   Pulse: 70 75 78   Resp:   19   Temp: 98.3 F (36.8 C) 98.3 F (36.8 C) 98 F (36.7 C)   TempSrc: Oral Oral Tympanic   SpO2: 100% 100% 98%   Weight:    63.3 kg  Height:         Body mass index is 24.72 kg/m.  Physical Exam   General: Laying comfortably in bed; in no acute distress. LTM EEG in place. HENT: Normal oropharynx and mucosa. Normal external appearance of ears and nose.  Neck: Supple, no pain or tenderness  CV: No JVD. No peripheral edema.  Pulmonary: Symmetric Chest rise. Normal respiratory effort.  Abdomen: Soft to touch, non-tender.  Ext: No cyanosis, edema, or deformity  Skin: No rash. Normal palpation of skin.   Musculoskeletal: Normal digits and nails by inspection. No clubbing.   Neurologic Examination  Mental status/Cognition: bit more awake compared to yesterday, oriented to self, place, age. Speech/language: slightly dysarthric, fluent, comprehension intact, object naming intact, repetition intact. Cranial nerves:   CN II Pupils equal and reactive to light, no  hemianopsia but visual extinction on the left.   CN III,IV,VI R gaze preference, readily crosses midline.   CN V normal sensation in V1, V2, and V3 segments bilaterally   CN VII ?mild L nasolabial fold flattening vs fold due to age. Move symmetrically when she talks.   CN VIII normal hearing to speech   CN IX & X normal palatal elevation, no uvular deviation   CN XI 5/5 head turn and 5/5 shoulder shrug bilaterally   CN XII midline tongue protrusion   Motor:  Muscle bulk: normal, tone normal Mvmt Root Nerve  Muscle Right Left Comments  SA C5/6 Ax Deltoid     EF C5/6 Mc Biceps 4 4   EE C6/7/8 Rad Triceps 4 4   WF C6/7 Med FCR     WE C7/8 PIN ECU     F Ab C8/T1 U ADM/FDI 4 4   HF L1/2/3 Fem Illopsoas 4 4   KE L2/3/4 Fem Quad     DF L4/5 D Peron Tib Ant     PF S1/2 Tibial Grc/Sol      Sensation:  Light touch Left extinction.   Pin prick    Temperature    Vibration   Proprioception    Coordination/Complex Motor:  - Finger to Nose intact BL - Rapid alternating movement are slow - Gait: deferred.  Labs   Basic Metabolic Panel:  Lab Results  Component Value Date   NA  139 05/22/2023   K 3.2 (L) 05/22/2023   CO2 24 05/22/2023   GLUCOSE 65 (L) 05/22/2023   BUN 11 05/22/2023   CREATININE 0.98 05/22/2023   CALCIUM 9.0 05/22/2023   GFRNONAA >60 05/22/2023   GFRAA 85 (L) 05/12/2014   HbA1c:  Lab Results  Component Value Date   HGBA1C 7.1 (H) 04/27/2013   LDL:  Lab Results  Component Value Date   LDLCALC 87 11/29/2011   Urine Drug Screen: No results found for: "LABOPIA", "COCAINSCRNUR", "LABBENZ", "AMPHETMU", "THCU", "LABBARB"  Alcohol Level     Component Value Date/Time   [facility] <10 02/10/2023 1940   Lab Results  Component Value Date   LEVETIRACETA WRORD 04/02/2023   No results found for: "PHENYTOIN", "PHENOBARB", "VALPROATE", "CBMZ"  Imaging and Diagnostic studies  Results for orders placed during the hospital encounter of 05/21/23  CT Head Wo Contrast CTH  was negative for a large hypodensity concerning for a large territory infarct or hyperdensity concerning for an ICH  cEEG: This study showed evidence of epileptogenicity arising from right hemisphere, maximal right parietal region which is on the ictal-interictal continuum with increased risk of seizures.  Additionally there is cortical dysfunction in right parietal region consistent with underlying craniotomy.   EEG was suggestive of moderate diffuse encephalopathy.  Lastly excessive beta activity is most likely secondary to benzodiazepine use and is a benign EEG pattern.  Impression   Tabitha Ramirez is a 77 y.o. 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   She is compliant with her Keppra and takes 1000mg  TID at home. She tested positive for COVID 19 which could have been a trigger.  EEG with PLEDs/BIRDs briefly becoming rhythmic. She clinically seems improved and thus will hold off on further AEDs at this time.  Recommendations  - continue LTM overnight - continue Keppra 1000mg  TID - Vimpat 50mg  BID - we will continue to follow. - treatment of COVID 19 per primary team. ______________________________________________________________________   Thank you for the opportunity to take part in the care of this patient. If you have any further questions, please contact the neurology consultation attending.  Signed,  Erick Blinks [facility]

## 2023-05-22 NOTE — TOC CM/SW Note (Signed)
Transition of Care [facility]) - Inpatient Brief Assessment   Patient Details  Name: Tabitha Ramirez MRN: [id number] Date of Birth: [birth year]  Transition of [facility]) CM/SW Contact:    Kermit Balo, RN Phone Number: 05/22/2023, 2:19 PM   Clinical Narrative:  Pt is from home with family.  On EEG.  Transition of Care Asessment: Insurance and Status: Insurance coverage has been reviewed Patient has primary care physician: Yes Home environment has been reviewed: home with family   Prior/Current Home Services: No current home services Social Determinants of Health Reivew: SDOH reviewed no interventions necessary Readmission risk has been reviewed: Yes Transition of care needs: no transition of care needs at this time

## 2023-05-22 NOTE — Progress Notes (Signed)
Inpatient Rehab Admissions Coordinator:   Per PT recommendations pt was screened for CIR by Estill Dooms, PT, DPT. At this time, pt does not appear to demonstrate the medical necessity to support a CIR admission.  Would recommend f/u at alternate level of care.  If pts medical course changes, I can look again.   Estill Dooms, PT, DPT Admissions Coordinator [phone] 05/22/23  5:02 PM

## 2023-05-22 NOTE — Evaluation (Signed)
Clinical/Bedside Swallow Evaluation Patient Details  Name: Tabitha Ramirez MRN: [id number] Date of Birth: 1945-10-18  Today's Date: 05/22/2023 Time: SLP Start Time (ACUTE ONLY): 0801 SLP Stop Time (ACUTE ONLY): 0835 SLP Time Calculation (min) (ACUTE ONLY): 34 min  Past Medical History:  Past Medical History:  Diagnosis Date   Allergy    Anemia    Arthritis    "joints" (09/29/2013)   Breast cancer (HCC)    GERD (gastroesophageal reflux disease)    Heart murmur    History of blood transfusion    "related to chemo/breast cancer" (09/29/2013)   Hypertension    Migraines    Personal history of chemotherapy    Personal history of radiation therapy    Radiation 11/05/13-12/24/13   Right breast    TMJ (temporomandibular joint syndrome)    "left; just dx'd" (09/29/2013   Past Surgical History:  Past Surgical History:  Procedure Laterality Date   AXILLARY LYMPH NODE BIOPSY Right 03/10/2013   Procedure: AXILLARY LYMPH NODE BIOPSY;  Surgeon: Ernestene Mention, MD;  Location: [facility] OR;  Service: General;  Laterality: Right;  sentinel node with blue dye   BREAST BIOPSY     BREAST LUMPECTOMY Right 09/29/2013   needle localization w/axillary LND/notes 09/29/2013   BREAST LUMPECTOMY WITH NEEDLE LOCALIZATION AND AXILLARY LYMPH NODE DISSECTION Right 09/29/2013   Procedure: RIGHT BREAST NEEDLE LOCALIZED LUMPECTOMY AND AXILLARY LYMPH NODE DISSECTION;  Surgeon: Ernestene Mention, MD;  Location: [facility] OR;  Service: General;  Laterality: Right;   COLONOSCOPY N/A 04/30/2013   Procedure: COLONOSCOPY;  Surgeon: Graylin Shiver, MD;  Location: [facility] ENDOSCOPY;  Service: Endoscopy;  Laterality: N/A;   CRANIOTOMY Right 03/11/2022   Procedure: CRANIOTOMY TUMOR EXCISION;  Surgeon: Venetia Night, MD;  Location: [facility] ORS;  Service: Neurosurgery;  Laterality: Right;   ESOPHAGOGASTRODUODENOSCOPY N/A 04/28/2013   Procedure: ESOPHAGOGASTRODUODENOSCOPY (EGD);  Surgeon: Graylin Shiver, MD;  Location: [facility];  Service: Endoscopy;   Laterality: N/A;   MASTECTOMY Right 09/29/2013   "partial"   PORT-A-CATH REMOVAL  09/29/2013   PORT-A-CATH REMOVAL Left 09/29/2013   Procedure: REMOVAL PORT-A-CATH;  Surgeon: Ernestene Mention, MD;  Location: [facility] OR;  Service: General;  Laterality: Left;   PORTACATH PLACEMENT N/A 03/10/2013   Procedure: INSERTION PORT-A-CATH WITH FLUOROSCOPY AND ULTRASOUND;  Surgeon: Ernestene Mention, MD;  Location: [facility] OR;  Service: General;  Laterality: N/A;   SURAL NERVE BX Right 05/13/2014   Procedure: SURAL NERVE BIOPSY;  Surgeon: Hewitt Shorts, MD;  Location: [facility] NEURO ORS;  Service: Neurosurgery;  Laterality: Right;  Right sural nerve biopsy   TONSILLECTOMY     TOTAL ABDOMINAL HYSTERECTOMY     With bilateral salpingo-oophorectomy   HPI:  77 yo female with h/o breast cancer s/p chemoXRT with brain mets - left parietal*. Pt admitted with seizures and also found to have COVID.  Swallow eval ordered.    Assessment / Plan / Recommendation  Clinical Impression  Functional oropharyngeal swallow ability based on clinical swallow evaluation.  She does have minimal left facial asymmetry with sensory deficit.  Able to pass 3 ounce Yale water challenge with no cough within sixty seconds.  She did demonstrate frequent throat clearing which she reports is due to COVID.  Swallow judged to be timely with clear voice throughout - Recommend regular/thin diet with general precautions.  Advise a PT/OT consult as pt reports baseilne weakness on her left side for which she uses a walker.  No SLP follow up indicated.  Thanks for this  consult.      Aspiration Risk  Mild aspiration risk    Diet Recommendation Regular;Thin liquid    Liquid Administration via: Cup;Straw Medication Administration: Whole meds with liquid Supervision: Patient able to self feed Compensations: Slow rate;Small sips/bites Postural Changes: Seated upright at 90 degrees;Remain upright for at least 30 minutes after po intake    Other  Recommendations  Oral Care Recommendations: Oral care BID    Recommendations for follow up therapy are one component of a multi-disciplinary discharge planning process, led by the attending physician.  Recommendations may be updated based on patient status, additional functional criteria and insurance authorization.  Follow up Recommendations No SLP follow up      Assistance Recommended at Discharge    Functional Status Assessment Patient has had a recent decline in their functional status and/or demonstrates limited ability to make significant improvements in function in a reasonable and predictable amount of time  Frequency and Duration            Prognosis        Swallow Study   General Date of Onset: 05/22/23 HPI: 77 yo female with h/o breast cancer s/p chemoXRT with brain mets - left parietal*. Pt admitted with seizures and also found to have COVID.  Swallow eval ordered. Type of Study: Bedside Swallow Evaluation Diet Prior to this Study: NPO Temperature Spikes Noted: No Respiratory Status: Room air History of Recent Intubation: No Behavior/Cognition: Alert;Cooperative;Pleasant mood Oral Cavity Assessment: Within Functional Limits Oral Care Completed by SLP: Yes Oral Cavity - Dentition: Adequate natural dentition Vision: Functional for self-feeding Self-Feeding Abilities: Able to feed self Patient Positioning: Upright in bed Baseline Vocal Quality: Low vocal intensity Volitional Cough: Weak Volitional Swallow: Able to elicit    Oral/Motor/Sensory Function Overall Oral Motor/Sensory Function: Mild impairment Facial Symmetry: Abnormal symmetry left Facial Strength: Reduced left Facial Sensation: Reduced left Lingual Symmetry: Abnormal symmetry left Lingual Strength: Reduced Lingual Sensation: Reduced Velum: Within Functional Limits Mandible: Within Functional Limits   Ice Chips Ice chips: Not tested   Thin Liquid Thin Liquid: Within functional limits Presentation: Cup;Self  Fed;Straw    Nectar Thick Nectar Thick Liquid: Not tested   Honey Thick Honey Thick Liquid: Not tested   Puree Puree: Within functional limits Presentation: Self Fed;Spoon   Solid     Solid: Within functional limits Presentation: Self Fed      Chales Abrahams 05/22/2023,8:57 AM  Rolena Infante, MS [facility] SLP Acute Rehab Services Office [phone]

## 2023-05-22 NOTE — Progress Notes (Signed)
Subjective:   Summary: Tabitha Ramirez is a 77 y.o. year old female currently admitted on the IMTS HD#1 for break through focal left sided tonic clonic seizure.  Overnight Events:   Both patient and nurse reported there were no significant overnight events, patient has been alert, oriented and responsive since she was admitted from the ED. Vitals remained stable, no seizures during the night.  The patient was seen at bedside this morning by the team and was observed eating breakfast independently.  She denied any pain except for in her right hand due to the IV, no headache, chest pain, or abdominal pain.  She was evaluated by speech therapy and the recommendation was to start a regular diet with thin liquids.  She does endorse mild fatigue, which she notes typically resolves once her seizure episode have subsided in the past. Neurology has been following the patient and she is on continuous EEG monitoring and per their note it has shown brief ictal-interictal rhythmic discharges, in the right parietal region.  Breach artifact, right parietal region.  Continuous slow, generalized and maximal right parietal region, excessive beta wave activity likely secondary to benzodiazepine use and is a benign finding.  They are still continuing to monitor at this time.     Objective:  Vital signs in last 24 hours: Vitals:   05/21/23 2331 05/22/23 0300 05/22/23 0500 05/22/23 0900  BP: 132/80 126/78  130/76  Pulse: 75 78  78  Resp:  19  18  Temp: 98.3 F (36.8 C) 98 F (36.7 C)  98.2 F (36.8 C)  TempSrc: Oral Tympanic  Oral  SpO2: 100% 98%  98%  Weight:   63.3 kg   Height:       Supplemental O2: Room Air SpO2: 98 %   Physical Exam:  Constitutional: Sitting up in bed, eating breakfast, in no acute distress.  EEG electrodes in place, seizure precautions in place, telemetry neuro camera in room. Cardiovascular: : regular rate and rhythm, Systolic murmur  Pulmonary/Chest: normal  work of breathing on room air, lungs clear to auscultation bilaterally Abdominal: soft, non-tender, non-distended Skin: warm and dry Extremities: upper/lower extremity pulses 2+, no lower extremity edema present Neuro: Alert and oriented to person time and place.  Pupils equal round reactive to light and accommodation, extraocular movements intact without nystagmus.  4 out of 5 grip strength in right hand and 3 out of 5 grip strength in left hand.  Tongue protrusion was midline. Abnormality was noted in finger-to-nose testing on the left and patient had significant difficulty with this task, intention tremor noted on left hand and there was overshooting and correcting. Patient able to elevate both legs off the bed 4 out of 5 strength on right leg and 3 out of 5 strength in the left leg. Patient had difficulty with heel-to-shin testing with her left leg, right leg heel-to-shin test was normal.  Filed Weights   05/21/23 1243 05/22/23 0500  Weight: 61 kg 63.3 kg     Intake/Output Summary (Last 24 hours) at 05/22/2023 1209 Last data filed at 05/22/2023 0412 Gross per 24 hour  Intake --  Output 300 ml  Net -300 ml   Net IO Since Admission: -300 mL [05/22/23 1209]  Pertinent Labs:    Latest Ref Rng & Units 05/22/2023    4:04 AM 05/21/2023   12:55 PM 04/02/2023    9:33 PM  CBC  WBC 4.0 - 10.5 K/uL 7.5  10.2  8.8   Hemoglobin 12.0 - 15.0 g/dL 9.8  30.8  65.7   Hematocrit 36.0 - 46.0 % 28.8  31.9  32.4   Platelets 150 - 400 K/uL 270  272  249        Latest Ref Rng & Units 05/22/2023    4:04 AM 05/21/2023   12:55 PM 04/02/2023    9:33 PM  CMP  Glucose 70 - 99 mg/dL 65  846  86   BUN 8 - 23 mg/dL 11  12  21    Creatinine 0.44 - 1.00 mg/dL 9.62  9.52  8.41   Sodium 135 - 145 mmol/L 139  138  139   Potassium 3.5 - 5.1 mmol/L 3.2  3.7  3.7   Chloride 98 - 111 mmol/L 103  103  103   CO2 22 - 32 mmol/L 24  24  28    Calcium 8.9 - 10.3 mg/dL 9.0  9.7  9.5   Total Protein 6.5 - 8.1 g/dL   8.1    Total Bilirubin 0.3 - 1.2 mg/dL   0.5   Alkaline Phos 38 - 126 U/L   84   AST 15 - 41 U/L   29   ALT 0 - 44 U/L   35       Latest Ref Rng & Units 05/22/2023    4:04 AM 05/21/2023   12:55 PM 04/02/2023    9:33 PM  CBC  WBC 4.0 - 10.5 K/uL 7.5  10.2  8.8   Hemoglobin 12.0 - 15.0 g/dL 9.8  32.4  40.1   Hematocrit 36.0 - 46.0 % 28.8  31.9  32.4   Platelets 150 - 400 K/uL 270  272  249       Latest Ref Rng & Units 05/22/2023    4:04 AM 05/21/2023   12:55 PM 04/02/2023    9:33 PM  BMP  Glucose 70 - 99 mg/dL 65  027  86   BUN 8 - 23 mg/dL 11  12  21    Creatinine 0.44 - 1.00 mg/dL 2.53  6.64  4.03   Sodium 135 - 145 mmol/L 139  138  139   Potassium 3.5 - 5.1 mmol/L 3.2  3.7  3.7   Chloride 98 - 111 mmol/L 103  103  103   CO2 22 - 32 mmol/L 24  24  28    Calcium 8.9 - 10.3 mg/dL 9.0  9.7  9.5      I personally reviewed interval labs and pertinent results include: Hypoglycemia, hypomagnesemia, hypokalemia, anemia.  Imaging: Overnight EEG with video  Result Date: 05/22/2023 [name], MD     05/22/2023  9:04 AM Patient Name: Tabitha Ramirez MRN: [id number] Epilepsy Attending: [name] Referring Physician/Provider: Erick Blinks, MD  Duration: 05/21/2023 1926 to 05/22/2023 0900 Patient history: 77 y.o. 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 EEG to evaluate for seizure. Level of alertness: Awake, asleep AEDs during EEG study: LEV, LCM Technical aspects: This EEG study was done with scalp electrodes positioned according to the 10-20 International system of electrode placement. Electrical activity was reviewed with band pass filter of 1-70Hz , sensitivity of 7 uV/mm, display speed of 28mm/sec with a 60Hz  notched filter applied as appropriate. EEG data were  recorded  continuously and digitally stored.  Video monitoring was available and reviewed as appropriate. Description: No clear posterior dominant rhythm was seen. Sleep was characterized by vertex waves, sleep spindles (12 to 14 Hz), maximal frontocentral region.  EEG showed continuous generalized 3 to 6 Hz theta-delta slowing with overriding 15 to 18 Hz beta activity distributed symmetrically and diffusely.  EEG also showed continuous polymorphic sharply contoured 3-6 theta- delta slowing admixed in right parietal region consistent with breach artifact. Lateralized periodic discharges were noted in right hemisphere, maximal right parietal region at 1 Hz at times with overriding rhythmicity lasting 5 to 10 seconds without definite evolution consistent with brief ictal event interictal rhythmic discharges.  Hyperventilation and photic stimulation were not performed.    ABNORMALITY -Brief ictal-interictal rhythmic discharges, right parietal region - Breach artifact, right parietal region -Continuous slow, generalized and maximal right parietal region - Excessive beta, generalized  IMPRESSION: This study showed evidence of epileptogenicity arising from right hemisphere, maximal right parietal region which is on the ictal-interictal continuum with increased risk of seizures.  Additionally there is cortical dysfunction in right parietal region consistent with underlying craniotomy. EEG was suggestive of moderate diffuse encephalopathy.  Lastly excessive beta activity is most likely secondary to benzodiazepine use and is a benign EEG pattern. [name]   DG CHEST PORT 1 VIEW  Result Date: 05/21/2023 CLINICAL DATA:  Recent seizure activity EXAM: PORTABLE CHEST 1 VIEW COMPARISON:  02/14/2016 FINDINGS: Cardiac shadow is enlarged but stable. Aortic calcifications are noted. The lungs are clear bilaterally. Postsurgical changes in the right breast are noted. No acute bony abnormality is seen. IMPRESSION: No acute abnormality  noted. Electronically Signed   By: Alcide Clever M.D.   On: 05/21/2023 20:47   CT Head Wo Contrast  Result Date: 05/21/2023 CLINICAL DATA:  Mental status change EXAM: CT HEAD WITHOUT CONTRAST TECHNIQUE: Contiguous axial images were obtained from the base of the skull through the vertex without intravenous contrast. RADIATION DOSE REDUCTION: This exam was performed according to the departmental dose-optimization program which includes automated exposure control, adjustment of the mA and/or kV according to patient size and/or use of iterative reconstruction technique. COMPARISON:  04/02/2023, 02/10/2023 FINDINGS: Brain: No evidence of acute infarction, hemorrhage, extra-axial collection, ventriculomegaly, or mass effect. Right parietal encephalomalacia. Generalized cerebral atrophy. Periventricular white matter low attenuation likely secondary to microangiopathy. Vascular: Cerebrovascular atherosclerotic calcifications are noted. Skull: Negative for fracture or focal lesion. Prior right parietal craniotomy. Sinuses/Orbits: Visualized portions of the orbits are unremarkable. Visualized portions of the paranasal sinuses are unremarkable. Visualized portions of the mastoid air cells are unremarkable. Other: None. IMPRESSION: 1. No acute intracranial pathology. 2. Chronic microvascular disease and cerebral atrophy. Electronically Signed   By: Elige Ko M.D.   On: 05/21/2023 14:53   I personally reviewed interval imaging and my interpretation is as follows: Chest x-ray no acute abnormality CT head no acute intracranial pathology.    EKG: My EKG interpretation is as follows: No new EKG personally reviewed my interpretation is normal ventricular rate of 80 BPM, Normal sinus rhythm, Normal QRS axis, PR interval , QT402, QTC460, inverted T waves in V1-V3   . Prior EKG is present and was reviewed.  EKG Interpretation Date/Time:  Monday May 21 2023 12:55:01 EDT Ventricular Rate:  79 PR Interval:  160 QRS  Duration:  126 QT Interval:  402 QTC Calculation: 460 R Axis:   -14  Text Interpretation: Normal sinus rhythm Right bundle branch block Abnormal ECG When compared  with ECG of 02-Apr-2023 18:13, PREVIOUS ECG IS PRESENT Confirmed by Kristine Royal [phone]) on 05/21/2023 1:27:32 PM   Assessment/Plan:   Principal Problem:   Seizure (HCC)   Patient Summary: Tabitha Ramirez is a 77 y.o. with a pertinent PMH of Breast cancer (s/p chemo and radiation and radiation) with mets to the brain, brain cancer s/p resection about 1 year ago (right parietal craniotomy), recurrent breakthrough seizures since craniotomy, HTN, HLD, hypothyroidism, who presented with breakthrough seizure and admitted for further workup on hospital day 1, who presented with breakthrough seizure, postictal changes and admitted for further workup, monitoring, and evaluation.    # Left Sided Breakthrough (Tonic Clonic Seizure) in setting of known brain mets from breast cancer # Hx of Breast Cancer with Mets # Hx of brain cancer s/p resection (right parietal craniotomy) - Neurology consulted and continuing to follow appreciate recommendations.  Will continue to follow neurology recommendations. - Per neurology plan: Continue LTM overnight, continue Keppra 1000 mg 3 times daily, Vimpat 50 mg twice daily - PT and OT consulted and evaluations pending  # SARS Coronavirus 2 Patient's RT-PCR from nasopharyngeal swab tested positive for coronavirus, 20 pathogen respiratory panel PCR was negative.  Patient exposed last week to COVID via grandson.  Patient's cough has resolved patient has not been hypoxic on room air.  Recent COVID virus infection may have precipitated most recent breakthrough seizure however, no need for COVID directed treatment at this time.  -Chest x-ray was obtained does not show any acute process -Continue with airborne/droplet precautions -Appreciate SLP evaluation and recommendations, regular diet with thin fluid  consistency initiated at this time.  # Hypokalemia # Hypomagnesemia # Hypoglycemia Prior to diet order change patient was n.p.o. and was found to have 2 episodes of glucose in the 60s, at this time patient was not symptomatic however hypoglycemia protocol was given once with dextrose. - Replete magnesium with 2 g - Replete potassium with 40 mEq - Continuous capillary blood glucose monitoring - Continue to monitor electrolytes and replace as needed.  Chronic Problems: # Hypothyroidism  - Continue with home dose of levothyroxine 100 mcg po every day # HLD -Continue with home rosuvastatin 10mg  QD #Chronic Iron Def Anemia - Continue with home ferrous sulfate.  Diet: Normal IVF: None, VTE: None Code: Full PT/OT recs: Pending, cane. Family Update: Family not in the room at time of exam will circle around to discuss plan with them.  Dispo: Anticipated discharge to Home in 1 days pending continued evaluation and neurology recommendations.   Larrie Kass [facility] Internal Medicine Medical Student Pager Number [phone] Please contact the on call pager after 5 pm and on weekends at [phone].

## 2023-05-22 NOTE — Procedures (Signed)
Patient Name: Tabitha Ramirez  MRN: [id number]  Epilepsy Attending: [name]  Referring Physician/Provider: Erick Blinks, MD   Duration: 05/21/2023 1926 to 05/22/2023 1926  Patient history: 77 y.o. 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 EEG to evaluate for seizure.  Level of alertness: Awake, asleep  AEDs during EEG study: LEV, LCM  Technical aspects: This EEG study was done with scalp electrodes positioned according to the 10-20 International system of electrode placement. Electrical activity was reviewed with band pass filter of 1-70Hz , sensitivity of 7 uV/mm, display speed of 9mm/sec with a 60Hz  notched filter applied as appropriate. EEG data were recorded continuously and digitally stored.  Video monitoring was available and reviewed as appropriate.  Description: No clear posterior dominant rhythm was seen. Sleep was characterized by vertex waves, sleep spindles (12 to 14 Hz), maximal frontocentral region.  EEG showed continuous generalized 3 to 6 Hz theta-delta slowing with overriding 15 to 18 Hz beta activity distributed symmetrically and diffusely.  EEG also showed continuous polymorphic sharply contoured 3-6 theta- delta slowing admixed in right parietal region consistent with breach artifact. Lateralized periodic discharges were noted in right hemisphere, maximal right parietal region at 1 Hz at times with overriding rhythmicity lasting 5 to 10 seconds without definite evolution consistent with brief ictal event interictal rhythmic discharges.  Hyperventilation and photic stimulation were not performed.      ABNORMALITY -Brief ictal-interictal rhythmic discharges, right parietal region - Breach artifact, right parietal region -Continuous slow, generalized  and maximal right parietal region - Excessive beta, generalized   IMPRESSION: This study showed evidence of epileptogenicity arising from right hemisphere, maximal right parietal region which is on the ictal-interictal continuum with increased risk of seizures.  Additionally there is cortical dysfunction in right parietal region consistent with underlying craniotomy.  EEG was suggestive of moderate diffuse encephalopathy.  Lastly excessive beta activity is most likely secondary to benzodiazepine use and is a benign EEG pattern.   Marshelle Bilger [name]

## 2023-05-23 ENCOUNTER — Ambulatory Visit: Payer: Medicare PPO | Admitting: Neurology

## 2023-05-23 DIAGNOSIS — R569 Unspecified convulsions: Secondary | ICD-10-CM | POA: Diagnosis not present

## 2023-05-23 DIAGNOSIS — Z85841 Personal history of malignant neoplasm of brain: Secondary | ICD-10-CM | POA: Diagnosis not present

## 2023-05-23 DIAGNOSIS — Z853 Personal history of malignant neoplasm of breast: Secondary | ICD-10-CM | POA: Diagnosis not present

## 2023-05-23 LAB — BASIC METABOLIC PANEL
Anion gap: 8 (ref 5–15)
BUN: 13 mg/dL (ref 8–23)
CO2: 25 mmol/L (ref 22–32)
Calcium: 8.9 mg/dL (ref 8.9–10.3)
Chloride: 104 mmol/L (ref 98–111)
Creatinine, Ser: 1.11 mg/dL — ABNORMAL HIGH (ref 0.44–1.00)
GFR, Estimated: 52 mL/min — ABNORMAL LOW (ref >60)
Glucose, Bld: 117 mg/dL — ABNORMAL HIGH (ref 70–99)
Potassium: 3.9 mmol/L (ref 3.5–5.1)
Sodium: 137 mmol/L (ref 135–145)

## 2023-05-23 LAB — GLUCOSE, CAPILLARY
Glucose-Capillary: 115 mg/dL — ABNORMAL HIGH (ref 70–99)
Glucose-Capillary: 132 mg/dL — ABNORMAL HIGH (ref 70–99)
Glucose-Capillary: 212 mg/dL — ABNORMAL HIGH (ref 70–99)

## 2023-05-23 LAB — MAGNESIUM: Magnesium: 1.8 mg/dL (ref 1.7–2.4)

## 2023-05-23 LAB — CBC
HCT: 30 % — ABNORMAL LOW (ref 36.0–46.0)
Hemoglobin: 10.4 g/dL — ABNORMAL LOW (ref 12.0–15.0)
MCH: 33.7 pg (ref 26.0–34.0)
MCHC: 34.7 g/dL (ref 30.0–36.0)
MCV: 97.1 fL (ref 80.0–100.0)
Platelets: 128 10*3/uL — ABNORMAL LOW (ref 150–400)
RBC: 3.09 MIL/uL — ABNORMAL LOW (ref 3.87–5.11)
RDW: 12.6 % (ref 11.5–15.5)
WBC: 11.3 10*3/uL — ABNORMAL HIGH (ref 4.0–10.5)
nRBC: 0 % (ref 0.0–0.2)

## 2023-05-23 MED ORDER — ACETAMINOPHEN 325 MG PO TABS
325.0000 mg | ORAL_TABLET | Freq: Four times a day (QID) | ORAL | Status: DC | PRN
  Administered 2023-05-23 – 2023-05-25 (×3): 650 mg via ORAL
  Filled 2023-05-23 (×3): qty 2

## 2023-05-23 NOTE — Discharge Summary (Addendum)
Name: Tabitha Ramirez MRN: [id number] DOB: 10-11-1945 77 y.o. PCP: [facility]  Date of Admission: 05/21/2023 12:38 PM Date of Discharge: 05/25/2023 Attending Physician: Gust Rung, DO  Discharge Diagnosis: 1. Principal Problem:   Seizure (HCC)  Break Through Seizure SARS Coronavirus 2   Discharge Medications: Allergies as of 05/25/2023       Reactions   Penicillins Rash   Face swelling   Nsaids Nausea And Vomiting, Other (See Comments)   Pt states stomach ulcers, vomiting, and bleeding.        Medication List     TAKE these medications    acetaminophen 325 MG tablet Commonly known as: TYLENOL Take 1-2 tablets (325-650 mg total) by mouth every 6 (six) hours as needed for mild pain.   anastrozole 1 MG tablet Commonly known as: ARIMIDEX TAKE 1 TABLET(1 MG) BY MOUTH DAILY   D3-1000 25 MCG (1000 UT) capsule Generic drug: Cholecalciferol Take 1 capsule (1,000 Units total) by mouth daily.   ferrous sulfate 325 (65 FE) MG tablet Take 325 mg by mouth daily with breakfast.   lacosamide 50 MG Tabs tablet Commonly known as: VIMPAT Take 1 tablet (50 mg total) by mouth 2 (two) times daily for 14 days.   levETIRAcetam 1000 MG tablet Commonly known as: KEPPRA Take 1,000 mg by mouth in the morning, at noon, and at bedtime. What changed: Another medication with the same name was removed. Continue taking this medication, and follow the directions you see here.   levothyroxine 100 MCG tablet Commonly known as: SYNTHROID Take 100 mcg by mouth daily before breakfast. What changed: Another medication with the same name was removed. Continue taking this medication, and follow the directions you see here.   LORazepam 0.5 MG tablet Commonly known as: ATIVAN Take 1 tablet as needed for seizure. Do not take more than 3 a week. What changed:  how much to take how to take this when to take this reasons to take this additional instructions    oxyCODONE-acetaminophen 5-325 MG tablet Commonly known as: PERCOCET/ROXICET Take 1 tablet by mouth every 6 (six) hours as needed for severe pain.   potassium chloride 10 MEQ tablet Commonly known as: KLOR-CON Take 10 mEq by mouth daily.   rosuvastatin 10 MG tablet Commonly known as: CRESTOR Take 1 tablet (10 mg total) by mouth daily.   triamterene-hydrochlorothiazide 37.5-25 MG tablet Commonly known as: MAXZIDE-25 Take 1 tablet by mouth daily.   vitamin B-12 500 MCG tablet Commonly known as: CYANOCOBALAMIN Take 500 mcg by mouth daily.       Disposition and follow-up:   Tabitha Ramirez was discharged from [facility] in Stable condition.  At the hospital follow up visit please address:  1.  Neurologist follow up within a week to evaluate the effectiveness of current seizure medications, consider potential adjustments to the treatment plan and discuss any triggers/factors (such as COVID) that may have contributed to the breakthrough Follow-up with PCP  2.  Labs / imaging needed at time of follow-up: none  3.  Pending labs/ test needing follow-up: None  Follow-up Appointments: Neurologist Dr. Patrcia Dolly, no follow up but patient will call to reschedule missed appt due to hospitalization with [facility] 07/30/2023 at 11:00am, patient will call to get an earlier appointment. Radiology 06/14/2023 at 11:40am  Oncology 06/18/2023 at 7:00am Rad Oncologist on 06/20/2023 at 11:00am  Hospital Course by problem list:  Tabitha Ramirez is a 77 year old  female with significant PMH of breast cancer (status post resection, chemotherapy and radiation) and brain cancer (status post right parietal craniotomy for tumor resection on 03/11/2022 by Dr. 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 Other past medical history includes hypertension, hyperlipidemia, hypothyroidism, hypokalemia, and iron  deficiency anemia. She was admitted on to [facility] for further workup and monitoring after presenting with a breakthrough seizure and postictal changes.  # Left Sided Breakthrough (Tonic Clonic Seizure) in setting of known brain mets from breast cancer # Hx of Breast Cancer with Mets # Hx of brain cancer s/p resection (right parietal craniotomy)  05/21/23: Patient admitted for a break through seizure that started around 9am. At that time symptoms included a headache, generalized weakness, left sided arm and leg weakness, and fatigue. Started having tonic clonic movements in the ED on the left side and was loaded with 1.5G Keppra and 2mg  ativan which promptly ended the seizure. CT head w/o contrast was done in the ER which should no acute intracranial pathology. Neurology was consulted and the patient was admitted with continuous EEG monitoring and seizure precautions. Additionally, patient was found to be covid positive which may have precipitated the most recent break through seizure, however her illness was very mild and not severe. Keppra level 54.6, patient compliant with all medications without any missed doses. Prior to this episode patient has had four breakthrough seizures since her right parietal craniotomy the most recent being on 04/16/23 and [facility].  05/22/23: No overnight events or seizures on cEEG. Patient doing better eating on her own, alert oriented to person time and place. Some Post-ictal Todd's observed in left extremities. Patient reports fatigue as well as intermittent spasms in her left shoulder that last for approximately one minute and come every 30-45 minutes. Magnesium was replaced as it was 1.6 and potassium was replaced with as it was 3.2. Overnight EEG showed: Brief ictal-interictal rhythmic discharges in the right parietal region, breach artifact in the right parietal region, continuous slow generalized and maximal right parietal region and excessive beta  waves generalized. 05/23/23: No overnight events, no seizures. Vitals remain stable. Continues to be afebrile.  Slight increase in WBC from 7.5-11.3, hemoglobin remained stable and is currently 10.4, platelets decreased from 270 to 128, slight bump in creatinine from 0.98 to 1.1. Magnesium was 1.8 and potassium was 3.9 Worked with PT yesterday, PMNR dropped a note not currently eligible for inpatient rehab.  Continuous overnight EEG impressions per neurology note " This study showed evidence of epileptogenicity arising from right hemisphere, maximal right parietal region with increased risk of seizures. Additionally there is cortical dysfunction in right parietal region consistent with underlying craniotomy. EEG was suggestive of moderate diffuse encephalopathy. Lastly excessive beta activity is most likely secondary to benzodiazepine use and is a benign EEG pattern." Neurology plans to remove LTM cEEG today, patient able to stop Vimpat and continue with home keppra 05/24/23: Patient continues to remain medically stable, no seizures.  Vitals and labs also stable discharge pending SNF placement. 05/25/23: Discharge to [facility] skilled nursing and rehabilitation center in [place].  - Continue Vimpat 50mg  bid for 2 weeks (from 8/28 until 06/06/2023). In admission note this was labeled as a home medication, the patient does not take this medication at home and this medication is not on the patients medication dispense history). - Schedule neurology appt within a week and discuss new medication of Vimpat 50mg  po bid for 14 days  and meet with neurologist to discuss this change. - Continue Keppra 1000mg  three times a day  - Tylenol for headache, and back arthritis pain.  - Discharge to [facility] skilled nursing and rehabilitation center in [place].   # SARS Coronavirus 2 Patient's RT-PCR from nasopharyngeal swab on 8/26 tested positive for coronavirus, 20 pathogen respiratory panel  PCR was negative.  Patient exposed last week to COVID via grandson.  Patient's cough has resolved and patient has continued to saturate well on room air.  Recent COVID virus infection may have precipitated most recent breakthrough seizure however, no need for COVID directed treatment at this time. Chest Xray impressions - No acute abnormality noted.   # Anxiety  Patient has a long standing history of anxiety/panic attacks, she states that when she gets anxious she normally takes Ativan 0.5 mg.  Due to the sedative effects of this medication would like to avoid this use and reserve it for her seizures. -Will monitor patient and see if she develops any worsening anxiety and consider adding low-dose BuSpar as needed  Chronic Problems:  # HTN On Triamterene 37.5mg /hydrochlorothiazide 25 mg   # Hypothyroidism  On levothyroxine 100 mcg   # HLD On rosuvastatin 10mg    # Chronic Iron Def Anemia On ferrous sulfate 325mg   # Breast Cancer s/p resection, chemo and radiation On 1mg  anastrozole  # Hypokalemia On 10 mEq Potassium Cl  Discharge Subjective: The patient was seen at bedside and evaluated today, she reports new anxiety and is overwhelmed once everything going on related to transfer to the nursing rehab center.  She endorses a mild headache with her anxiety she states she uses Ativan in the past for her anxiety/panic attacks.  She endorses she feels like she is been slowly regaining some of her strength. Spoke with patient's daughter she is agreeable to skilled nursing/rehab center short-term.  She endorsed that the patient's mental status is not exactly where it was prior to the seizure but appears better than from previous conversations over the phone.  Agreeable with plan of short-time skilled nursing home rehabilitation until patient regains enough strength to return to her prior living situation.Marland Kitchen  Discharge Exam:   BP (!) 92/52 (BP Location: Right Arm)   Pulse 88   Temp 99.1 F  (37.3 C) (Oral)   Resp 18   Ht 5\' 3"  (1.6 m)   Wt 62.5 kg   SpO2 98%   BMI 24.41 kg/m  Physical Exam Vitals and nursing note reviewed.  Constitutional:      General: She is not in acute distress.    Appearance: Normal appearance. She is normal weight. She is not ill-appearing.  HENT:     Nose: No congestion or rhinorrhea.     Mouth/Throat:     Mouth: Mucous membranes are moist.  Eyes:     Extraocular Movements: Extraocular movements intact.     Pupils: Pupils are equal, round, and reactive to light.  Cardiovascular:     Rate and Rhythm: Normal rate and regular rhythm.     Heart sounds: Murmur heard.  Pulmonary:     Effort: Pulmonary effort is normal.     Breath sounds: Normal breath sounds.  Abdominal:     General: Bowel sounds are normal.     Palpations: Abdomen is soft.  Neurological:     Mental Status: She is alert and oriented to person, place, and time. Mental status is at baseline.     Cranial Nerves: Cranial nerves 2-12 are  intact. No facial asymmetry.     Motor: Weakness present. No seizure activity.     Coordination: Heel to [facility] Test normal.     Comments:  4/5 muscle strength on the left in forearm flexion and extension 5/5 muscle strength on right in forearm flexion and extension 4/5 muscle strength in left leg flexion 5/5 muscle strength in right leg flexion  Psychiatric:        Attention and Perception: Attention normal.        Mood and Affect: Mood and affect normal.        Speech: Speech is delayed.        Cognition and Memory: Cognition and memory normal.    Pertinent Labs, Studies, and Procedures:     Latest Ref Rng & Units 05/23/2023    6:30 AM 05/22/2023    4:04 AM 05/21/2023   12:55 PM  CBC  WBC 4.0 - 10.5 K/uL 11.3  7.5  10.2   Hemoglobin 12.0 - 15.0 g/dL 76.1  9.8  60.7   Hematocrit 36.0 - 46.0 % 30.0  28.8  31.9   Platelets 150 - 400 K/uL 128  270  272        Latest Ref Rng & Units 05/23/2023    6:30 AM 05/22/2023    4:04 AM 05/21/2023    12:55 PM  BMP  Glucose 70 - 99 mg/dL 371  65  062   BUN 8 - 23 mg/dL 13  11  12    Creatinine 0.44 - 1.00 mg/dL 6.94  8.54  6.27   Sodium 135 - 145 mmol/L 137  139  138   Potassium 3.5 - 5.1 mmol/L 3.9  3.2  3.7   Chloride 98 - 111 mmol/L 104  103  103   CO2 22 - 32 mmol/L 25  24  24    Calcium 8.9 - 10.3 mg/dL 8.9  9.0  9.7    CBG (last 3)  Recent Labs    05/23/23 1631 05/24/23 0616 05/25/23 0627  GLUCAP 212* 108* 125*    Lab Results  Component Value Date   MG 1.8 05/23/2023   MG 1.6 (L) 05/22/2023   MG 1.8 04/02/2023    Vitals:   05/25/23 1107  BP: (!) 92/52  Pulse: 88  Resp: 18  Temp: 99.1 F (37.3 C)  SpO2: 98%     99.1 F (37.3 C) (Oral)  Overnight EEG with video  Result Date: 05/22/2023 [name], MD     05/23/2023  9:53 AM Patient Name: Tabitha Ramirez MRN: [id number] Epilepsy Attending: [name] Referring Physician/Provider: Erick Blinks, MD  Duration: 05/21/2023 1926 to 05/22/2023 1926 Patient history: 77 y.o. 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 EEG to evaluate for seizure. Level of alertness: Awake, asleep AEDs during EEG study: LEV, LCM Technical aspects: This EEG study was done with scalp electrodes positioned according to the 10-20 International system of electrode placement. Electrical activity was reviewed with band pass filter of 1-70Hz , sensitivity of 7 uV/mm, display speed of 79mm/sec with a 60Hz  notched filter applied as appropriate. EEG data were recorded continuously and digitally stored.  Video monitoring was available and reviewed as appropriate. Description: No clear posterior dominant rhythm was seen. Sleep was characterized by vertex waves, sleep spindles (12 to 14 Hz), maximal  frontocentral region.  EEG showed  continuous generalized 3 to 6 Hz theta-delta slowing with overriding 15 to 18 Hz beta activity distributed symmetrically and diffusely.  EEG also showed continuous polymorphic sharply contoured 3-6 theta- delta slowing admixed in right parietal region consistent with breach artifact. Lateralized periodic discharges were noted in right hemisphere, maximal right parietal region at 1 Hz at times with overriding rhythmicity lasting 5 to 10 seconds without definite evolution consistent with brief ictal event interictal rhythmic discharges.  Hyperventilation and photic stimulation were not performed.    ABNORMALITY -Brief ictal-interictal rhythmic discharges, right parietal region - Breach artifact, right parietal region -Continuous slow, generalized and maximal right parietal region - Excessive beta, generalized  IMPRESSION: This study showed evidence of epileptogenicity arising from right hemisphere, maximal right parietal region which is on the ictal-interictal continuum with increased risk of seizures.  Additionally there is cortical dysfunction in right parietal region consistent with underlying craniotomy. EEG was suggestive of moderate diffuse encephalopathy.  Lastly excessive beta activity is most likely secondary to benzodiazepine use and is a benign EEG pattern. [name]   DG CHEST PORT 1 VIEW  Result Date: 05/21/2023 CLINICAL DATA:  Recent seizure activity EXAM: PORTABLE CHEST 1 VIEW COMPARISON:  02/14/2016 FINDINGS: Cardiac shadow is enlarged but stable. Aortic calcifications are noted. The lungs are clear bilaterally. Postsurgical changes in the right breast are noted. No acute bony abnormality is seen. IMPRESSION: No acute abnormality noted. Electronically Signed   By: Alcide Clever M.D.   On: 05/21/2023 20:47   CT Head Wo Contrast  Result Date: 05/21/2023 CLINICAL DATA:  Mental status change EXAM: CT HEAD WITHOUT CONTRAST TECHNIQUE: Contiguous axial images were obtained from the base of the  skull through the vertex without intravenous contrast. RADIATION DOSE REDUCTION: This exam was performed according to the departmental dose-optimization program which includes automated exposure control, adjustment of the mA and/or kV according to patient size and/or use of iterative reconstruction technique. COMPARISON:  04/02/2023, 02/10/2023 FINDINGS: Brain: No evidence of acute infarction, hemorrhage, extra-axial collection, ventriculomegaly, or mass effect. Right parietal encephalomalacia. Generalized cerebral atrophy. Periventricular white matter low attenuation likely secondary to microangiopathy. Vascular: Cerebrovascular atherosclerotic calcifications are noted. Skull: Negative for fracture or focal lesion. Prior right parietal craniotomy. Sinuses/Orbits: Visualized portions of the orbits are unremarkable. Visualized portions of the paranasal sinuses are unremarkable. Visualized portions of the mastoid air cells are unremarkable. Other: None. IMPRESSION: 1. No acute intracranial pathology. 2. Chronic microvascular disease and cerebral atrophy. Electronically Signed   By: Elige Ko M.D.   On: 05/21/2023 14:53     Discharge Instructions: Discharge Instructions     Call MD for:  difficulty breathing, headache or visual disturbances   Complete by: As directed    Call MD for:  extreme fatigue   Complete by: As directed    Call MD for:  persistant nausea and vomiting   Complete by: As directed    Call MD for:  severe uncontrolled pain   Complete by: As directed    Call MD for:  temperature >100.4   Complete by: As directed    Diet - low sodium heart healthy   Complete by: As directed    Increase activity slowly   Complete by: As directed      Hello Ms. Lariviere, it was a pleasure meeting you and your family, and I appreciate the opportunity to be apart of your care team. You were evaluated in the Hospital for a recent seizure. To ensure  your safety, avoid baths and unsupervised swimming,  stay away from heights and heavy machinery, and use caution when cooking by keeping away from open flames and hot surfaces. Do not drive until you receive a formal decision from the [facility] of Motor Vehicles [facility]) Medical Evaluation Program; call [phone] to report your seizure and complete the required paperwork with your neurologist or primary care physician. Continue taking your Keppra as prescribed and follow up with your neurologist as directed. Contact your healthcare provider if you have any concerns or experience new symptoms. Please return to the emergency department if you experience a seizure lasting more than 3 minutes, or occurs repeatedly without regaining consciousness in between. Seek immediate medical attention if you have difficulty breathing, sustained head injury or other serious injury during seizure, or noted significant change in seizure pattern or frequency.  Additionally, go to the emergency department if you develop a fever, severe headache, or confusion after the seizure.  Signed: Martie Round, Medical Student 05/25/2023, 11:53 AM   Pager: [phone]  Attestation for Student Documentation:  I personally was present and re-performed the history, physical exam and medical decision-making activities of this service and have verified that the service and findings are accurately documented in the student's note.  Rocky Morel, DO 05/25/2023, 1:39 PM

## 2023-05-23 NOTE — Progress Notes (Signed)
NEUROLOGY CONSULTATION PROGRESS NOTE   Date of service: May 23, 2023 Patient Name: Tabitha Ramirez MRN:  [id number] DOB:  Mar 25, 1946  Brief HPI  Tabitha Ramirez is a 77 y.o. 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   She is compliant with her Keppra and takes 1000mg  TID at home. She tested positive for COVID 19 which could have been a trigger.  LTM overnight again with no seizures.   Interval Hx   Continues to improve. No extinction noted today. Has L sensory deficit in her leg that could be explained by her surgical resection.  Vitals   Vitals:   05/23/23 0323 05/23/23 0500 05/23/23 0837 05/23/23 1121  BP: 119/65  (!) 108/59 (!) 111/59  Pulse: 76  73 81  Resp: 19  16 16   Temp: 99 F (37.2 C)  100.3 F (37.9 C) 98.2 F (36.8 C)  TempSrc: Oral  Oral Oral  SpO2: 100%  98% 100%  Weight:  62.5 kg    Height:         Body mass index is 24.41 kg/m.  Physical Exam   General: Laying comfortably in bed; in no acute distress. LTM EEG in place. HENT: Normal oropharynx and mucosa. Normal external appearance of ears and nose.  Neck: Supple, no pain or tenderness  CV: No JVD. No peripheral edema.  Pulmonary: Symmetric Chest rise. Normal respiratory effort.  Abdomen: Soft to touch, non-tender.  Ext: No cyanosis, edema, or deformity  Skin: No rash. Normal palpation of skin.   Musculoskeletal: Normal digits and nails by inspection. No clubbing.   Neurologic Examination  Mental status/Cognition: awake, alert, oriented to self, place, age, month and year. Speech/language: fluent, comprehension intact, object naming intact, repetition intact. Cranial nerves:   CN II Pupils equal and reactive to light, intact visual fields.   CN III,IV,VI No gaze preference, no  nystagmus.   CN V normal sensation in V1, V2, and V3 segments bilaterally   CN VII No facial asymmetry.   CN VIII normal hearing to speech   CN IX & X normal palatal elevation, no uvular deviation   CN XI 5/5 head turn and 5/5 shoulder shrug bilaterally   CN XII midline tongue protrusion   Motor:  Muscle bulk: normal, tone normal Mvmt Root Nerve  Muscle Right Left Comments  SA C5/6 Ax Deltoid     EF C5/6 Mc Biceps 4 4   EE C6/7/8 Rad Triceps 4 4   WF C6/7 Med FCR     WE C7/8 PIN ECU     F Ab C8/T1 U ADM/FDI 4 4   HF L1/2/3 Fem Illopsoas 4 4   KE L2/3/4 Fem Quad     DF L4/5 D Peron Tib Ant     PF S1/2 Tibial Grc/Sol      Sensation:  Light touch Left leg absent to touch   Pin prick    Temperature    Vibration   Proprioception    Coordination/Complex Motor:  - Finger to Nose intact BL - Rapid alternating movement are slow - Gait: deferred.  Labs   Basic Metabolic Panel:  Lab Results  Component Value Date   NA 137 05/23/2023   K 3.9 05/23/2023   CO2 25 05/23/2023   GLUCOSE 117 (H) 05/23/2023   BUN 13 05/23/2023  CREATININE 1.11 (H) 05/23/2023   CALCIUM 8.9 05/23/2023   GFRNONAA 52 (L) 05/23/2023   GFRAA 85 (L) 05/12/2014   HbA1c:  Lab Results  Component Value Date   HGBA1C 7.1 (H) 04/27/2013   LDL:  Lab Results  Component Value Date   LDLCALC 87 11/29/2011   Urine Drug Screen: No results found for: "LABOPIA", "COCAINSCRNUR", "LABBENZ", "AMPHETMU", "THCU", "LABBARB"  Alcohol Level     Component Value Date/Time   ETH <10 02/10/2023 1940   Lab Results  Component Value Date   LEVETIRACETA 54.7 (H) 05/21/2023   No results found for: "PHENYTOIN", "PHENOBARB", "VALPROATE", "CBMZ"  Imaging and Diagnostic studies  Results for orders placed during the hospital encounter of 05/21/23  CT Head Wo Contrast CTH was negative for a large hypodensity concerning for a large territory infarct or hyperdensity concerning for an ICH  cEEG: This study showed  evidence of epileptogenicity arising from right hemisphere, maximal right parietal region with increased risk of seizures. Additionally there is cortical dysfunction in right parietal region consistent with underlying craniotomy.   EEG was suggestive of moderate diffuse encephalopathy. Lastly excessive beta activity is most likely secondary to benzodiazepine use and is a benign EEG pattern.  Impression   Tabitha Ramirez is a 77 y.o. 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   She is compliant with her Keppra and takes 1000mg  TID at home. She tested positive for COVID 19 which could have been a trigger.  EEG with no seizures, R parietal sharp waves.  Recommendations  - discontinue LTM EEG. - continue Keppra 1000mg  TID - Vimpat 50mg  BID for the next 2 weeks while she recovers from COVID 19 and then can be stopped. - we will signoff. - follow up with her neurologist outpatient Dr. Elissa Hefty. ______________________________________________________________________  Plan discussed with primary team.  Thank you for the opportunity to take part in the care of this patient. If you have any further questions, please contact the neurology consultation attending.  Signed,  Erick Blinks [facility]

## 2023-05-23 NOTE — Plan of Care (Signed)

## 2023-05-23 NOTE — Procedures (Addendum)
Patient Name: Tabitha Ramirez  MRN: [id number]  Epilepsy Attending: [name]  Referring Physician/Provider: Erick Blinks, MD   Duration: 05/22/2023 1926 to 05/23/2023 1303   Patient history: 77 y.o. 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 EEG to evaluate for seizure.   Level of alertness: Awake, asleep   AEDs during EEG study: LEV, LCM   Technical aspects: This EEG study was done with scalp electrodes positioned according to the 10-20 International system of electrode placement. Electrical activity was reviewed with band pass filter of 1-70Hz , sensitivity of 7 uV/mm, display speed of 41mm/sec with a 60Hz  notched filter applied as appropriate. EEG data were recorded continuously and digitally stored.  Video monitoring was available and reviewed as appropriate.   Description: No clear posterior dominant rhythm was seen. Sleep was characterized by vertex waves, sleep spindles (12 to 14 Hz), maximal frontocentral region. EEG showed continuous generalized 3 to 6 Hz theta-delta slowing with overriding 15 to 18 Hz beta activity distributed symmetrically and diffusely. EEG also showed continuous polymorphic sharply contoured 3-6 theta- delta slowing admixed in right parietal region consistent with breach artifact. Sharp waves were noted in right parietal region at  times qasi- periodic at 1 Hz. Hyperventilation and photic stimulation were not performed.      ABNORMALITY - Sharp waves, right parietal region - Breach artifact, right parietal region - Continuous slow, generalized and maximal right parietal region - Excessive beta, generalized   IMPRESSION: This study showed evidence of epileptogenicity arising from right hemisphere, maximal right parietal region with  increased risk of seizures. Additionally there is cortical dysfunction in right parietal region consistent with underlying craniotomy.   EEG was suggestive of moderate diffuse encephalopathy. Lastly excessive beta activity is most likely secondary to benzodiazepine use and is a benign EEG pattern.     Carlin Mamone [name]

## 2023-05-23 NOTE — Discharge Instructions (Addendum)
Hello Ms. Littig, it was a pleasure meeting you and your family, and I appreciate the opportunity to be apart of your care team. [name] will be going to [facility] skilled nursing and rehabilitation center in [place]. The goal is to be there for a short amount of time, and regain your strength to where you were at prior to the seizure, and ultimately return home to your family.  You were evaluated in the Hospital for a recent seizure. To ensure your safety, avoid baths and unsupervised swimming, stay away from heights and heavy machinery, and use caution when cooking by keeping away from open flames and hot surfaces. Do not drive until you receive a formal decision from the [facility]) Medical Evaluation Program; call [phone] to report your seizure and complete the required paperwork with your neurologist or primary care physician. Continue taking your Keppra as prescribed and follow up with your neurologist with in a week to discuss the new seizure medication you will be taking called Vimpat, you were on this medication in the hospital and will continue to take it for 2 weeks. Call your neurologist office for earliest available appointment to discuss this medication.  Please START taking:  Vimpat/Lacosamide 50mg  by mouth two times a day for seizure prophylaxis (from 8/28 until 06/06/2023)   Please CONTINUE taking: 1000mg  Keppra/Levetiracetam three times a day (indefinitely for seizure prevention)   Lorazepam/ Ativan 0.5mg  Take one tablet as needed if you are having a seizure  Acetaminophen (TYLENOL): 325-650 mg as needed for your arthritis pain  Percocet 5-325mg  for severe pain  Potassium Chloride (potassium supplement) Anastrozole (ARIMIDEX): 1 mg once a day (breast cancer med) Cholecalciferol (VITAMIN D3): 1,000 Units once a day Cyanocobalamin (VITAMIN B12): 500 mcg once a day  Ferrous Sulfate: 325 mg, in the morning with breakfast (Iron  supplement) Levothyroxine (SYNTHROID): 100 mcg, QAC breakfast (Thyroid hormone med) Rosuvastatin (CRESTOR): 10 mg, Daily (Cholesterol med) Triamterene-Hydrochlorothiazide (MAXZIDE-25): 37.5-25 mg, Daily (Blood pressure med)   Contact your healthcare provider if you have any concerns or experience new symptoms.Please return to the emergency department if you experience a seizure lasting more than 3 minutes, or occurs repeatedly without regaining consciousness in between. Seek immediate medical attention if you have difficulty breathing, sustained head injury or other serious injury during seizure, or noted significant change in seizure pattern or frequency.  Additionally, go to the ED if you develop a fever, severe headache, or confusion after the seizure.

## 2023-05-23 NOTE — Progress Notes (Signed)
OT Cancellation Note  Patient Details Name: Tabitha Ramirez MRN: [id number] DOB: [birth year]   Cancelled Treatment:    Reason Eval/Treat Not Completed: Other (comment) (Attempted to see pt for OT eval this afternoon. Pt was not in room and unavailable. Will re-attempt eval tomorrow)  Limmie Patricia, OTR/L,CBIS  Supplemental OT - [facility] and [facility] Secure Chat Preferred   05/23/2023, 4:26 PM

## 2023-05-23 NOTE — Progress Notes (Signed)
Physical Therapy Treatment  Patient Details Name: Tabitha Ramirez MRN: [id number] DOB: [birth year] Today's Date: 05/23/2023   History of Present Illness 77 y.o. female presents to [facility] hospital on 05/21/2023 after witnessed seizure. Pt also found to be COVID+. PMH includes breast CA with brain mets s/p resection, HTN, HLD, hypothyroidism.    PT Comments  Pt progressing slowly towards physical therapy goals. Was able to perform transfers with up to max assist but was unable to achieve full stand or initiate gait training. Pt has poor insight into deficits and attributes her current state to "getting too cold overnight which has made [her] back hurt".  Pt reports she will be fine upon return home. Based on performance today, pt will benefit from post-acute rehab <3 hours/day to maximize functional independence, safety, and decrease risk for falls upon return home with family support. Will continue to follow.    If plan is discharge home, recommend the following: A lot of help with walking and/or transfers;A lot of help with bathing/dressing/bathroom;Assistance with cooking/housework;Assistance with feeding;Assist for transportation;Direct supervision/assist for medications management;Direct supervision/assist for financial management;Help with stairs or ramp for entrance;Supervision due to cognitive status   Can travel by private vehicle     No  Equipment Recommendations   (TBD pending progress)    Recommendations for Other Services Rehab consult     Precautions / Restrictions Precautions Precautions: Fall Precaution Comments: EEG, seizure Restrictions Weight Bearing Restrictions: No     Mobility  Bed Mobility Overal bed mobility: Needs Assistance Bed Mobility: Supine to Sit, Sit to Supine     Supine to sit: Min assist, HOB elevated Sit to supine: Mod assist   General bed mobility comments: LUE extending behind her with attempts to push off of bed. Therapist holding palm down on bed  for pt to have leverage to push. Increased time to get feet on the floor.    Transfers Overall transfer level: Needs assistance Equipment used: 1 person hand held assist, Rolling walker (2 wheels) Transfers: Sit to/from Stand Sit to Stand: Max assist          Lateral/Scoot Transfers: Max assist General transfer comment: Unable to stand (total assist) with RW for support. Several attempts with therapist helping to power up and holding L hand on walker. Attempted again to stand without RW and required max assist with face-to-face transfer. Pt unable to fully extend knees and hips for upright posture. Max assist for scoot transfer up towards [facility] to reposition prior to returning to supine.    Ambulation/Gait               General Gait Details: Unable to ambulate at this time.   Stairs             Wheelchair Mobility     Tilt Bed    Modified Rankin (Stroke Patients Only)       Balance Overall balance assessment: Needs assistance Sitting-balance support: No upper extremity supported, Feet supported Sitting balance-Leahy Scale: Fair     Standing balance support: Single extremity supported Standing balance-Leahy Scale: Poor Standing balance comment: minA                            Cognition Arousal: Alert Behavior During Therapy: WFL for tasks assessed/performed Overall Cognitive Status: Impaired/Different from baseline Area of Impairment: Awareness, Safety/judgement, Problem solving  Safety/Judgement: Decreased awareness of safety, Decreased awareness of deficits Awareness: Emergent Problem Solving: Slow processing, Difficulty sequencing General Comments: Poor insight to deficits.        Exercises      General Comments        Pertinent Vitals/Pain Pain Assessment Pain Assessment: Faces Faces Pain Scale: Hurts a little bit Pain Location: back Pain Descriptors / Indicators: Sore, Aching Pain  Intervention(s): Limited activity within patient's tolerance, Monitored during session, Repositioned    Home Living                          Prior Function            PT Goals (current goals can now be found in the care plan section) Acute Rehab PT Goals Patient Stated Goal: to return to prior level of function PT Goal Formulation: With patient Time For Goal Achievement: 06/05/23 Potential to Achieve Goals: Fair Progress towards PT goals: Progressing toward goals    Frequency    Min 1X/week      PT Plan      Co-evaluation              AM-PAC PT "6 Clicks" Mobility   Outcome Measure  Help needed turning from your back to your side while in a flat bed without using bedrails?: A Little Help needed moving from lying on your back to sitting on the side of a flat bed without using bedrails?: A Lot Help needed moving to and from a bed to a chair (including a wheelchair)?: A Lot Help needed standing up from a chair using your arms (e.g., wheelchair or bedside chair)?: A Lot Help needed to walk in hospital room?: Total Help needed climbing 3-5 steps with a railing? : Total 6 Click Score: 11    End of Session Equipment Utilized During Treatment: Gait belt Activity Tolerance: Patient tolerated treatment well Patient left: in bed;with call bell/phone within reach;with bed alarm set Nurse Communication: Mobility status PT Visit Diagnosis: Other abnormalities of gait and mobility (R26.89);Muscle weakness (generalized) (M62.81);Other symptoms and signs involving the nervous system (R29.898)     Time: 1610-9604 PT Time Calculation (min) (ACUTE ONLY): 22 min  Charges:    $Therapeutic Activity: 8-22 mins PT General Charges $$ ACUTE PT VISIT: 1 Visit                     Conni Slipper, PT, DPT Acute Rehabilitation Services Secure Chat Preferred Office: [phone]    Marylynn Pearson 05/23/2023, 4:04 PM

## 2023-05-23 NOTE — Progress Notes (Signed)
vLTM discontinued   [facility] notified.  No skin breakdown at all skin sites.

## 2023-05-23 NOTE — Progress Notes (Signed)
Subjective:   Summary: Tabitha Ramirez is a 77 y.o. year old female currently admitted on the IMTS HD#2 for breakthrough seizure and Covid19.  Overnight Events:   The patient was seen at bedside and evaluated today, she reports she is doing well and denies any pain.  She states that the intermittent spasms in her left upper extremity have resolved and she has not had them since last night.  She endorses last night she had some lower back pain as it was cold in her room and flared up her arthritis she said that once she got more blankets that went away.  Denied any fevers, cough, congestion. Tylenol was also given which helped with her arthritis pain.  Patient states that her fatigue/weakness has been improving.  Patient understands that she has COVID-19, she asked our team if she was still contagious, she was reassured that given her being asymptomatic, afebrile, without any congestion, rhinorrhea, coughing or sneezing risk of transmission is low, but still possible. Discussed plan for discharge today with patient and she was agreeable and was calling her daughter to arrange transportation, I told her I would also call to give her daughter an update as well.  [name] was called and further conversation with her over the phone today, she expressed concerns that the patient was not at her baseline level of cognition, stating slight confusion during the conversation.  The daughter requests that the patient stays overnight for further observation.  The daughter plans on returning to town tomorrow morning, and will be able to bring the patient home.  She also requests arrangement for home health services to be made to support the patient's transition home.    Objective:  Vital signs in last 24 hours: Vitals:   05/23/23 0323 05/23/23 0500 05/23/23 0837 05/23/23 1121  BP: 119/65  (!) 108/59 (!) 111/59  Pulse: 76  73 81  Resp: 19  16 16   Temp: 99 F (37.2 C)  100.3 F  (37.9 C) 98.2 F (36.8 C)  TempSrc: Oral  Oral Oral  SpO2: 100%  98% 100%  Weight:  62.5 kg    Height:       Supplemental O2: Room Air SpO2: 100 %   Physical Exam:  Vitals and nursing note reviewed.  Constitutional:      General: She is not in acute distress.    Appearance: Normal appearance. She is normal weight. She is not ill-appearing.  HENT:     Head:     Comments: cEEG scalp electrodes still in place    Nose: No congestion or rhinorrhea.     Mouth/Throat:     Mouth: Mucous membranes are moist.  Eyes:     Extraocular Movements: Extraocular movements intact.     Pupils: Pupils are equal, round, and reactive to light.  Cardiovascular:     Rate and Rhythm: Normal rate and regular rhythm.     Heart sounds: Murmur heard.  Pulmonary:     Effort: Pulmonary effort is normal.     Breath sounds: Normal breath sounds.  Abdominal:     General: Bowel sounds are normal.     Palpations: Abdomen is soft.  Neurological:     Mental Status: She is alert and oriented to person, place, and time. Mental status is at baseline.     Cranial Nerves: Cranial nerves 2-12 are intact. No facial asymmetry.  Motor: Weakness present.     Coordination: Finger-Nose-Finger Test abnormal.     Comments:  3/5 muscle strength on the left in forearm flexion and extension 5/5 muscle strength on right in forearm flexion and extension 3/5 muscle strength in left leg flexion 4/5 muscle strength in right leg flexion  Psychiatric:        Attention and Perception: Attention normal.        Mood and Affect: Mood and affect normal.        Speech: Speech is delayed.        Cognition and Memory: Cognition and memory normal.   Filed Weights   05/21/23 1243 05/22/23 0500 05/23/23 0500  Weight: 61 kg 63.3 kg 62.5 kg     Intake/Output Summary (Last 24 hours) at 05/23/2023 1252 Last data filed at 05/23/2023 1100 Gross per 24 hour  Intake 120 ml  Output 500 ml  Net -380 ml   Net IO Since Admission: -443  mL [05/23/23 1252]  Pertinent Labs:    Latest Ref Rng & Units 05/23/2023    6:30 AM 05/22/2023    4:04 AM 05/21/2023   12:55 PM  CBC  WBC 4.0 - 10.5 K/uL 11.3  7.5  10.2   Hemoglobin 12.0 - 15.0 g/dL 16.1  9.8  09.6   Hematocrit 36.0 - 46.0 % 30.0  28.8  31.9   Platelets 150 - 400 K/uL 128  270  272        Latest Ref Rng & Units 05/23/2023    6:30 AM 05/22/2023    4:04 AM 05/21/2023   12:55 PM  CMP  Glucose 70 - 99 mg/dL 045  65  409   BUN 8 - 23 mg/dL 13  11  12    Creatinine 0.44 - 1.00 mg/dL 8.11  9.14  7.82   Sodium 135 - 145 mmol/L 137  139  138   Potassium 3.5 - 5.1 mmol/L 3.9  3.2  3.7   Chloride 98 - 111 mmol/L 104  103  103   CO2 22 - 32 mmol/L 25  24  24    Calcium 8.9 - 10.3 mg/dL 8.9  9.0  9.7     Imaging: No results found. Overnight EEG with video  Result Date: 05/22/2023 [name], MD     05/23/2023  9:53 AM Patient Name: Tabitha Ramirez MRN: [id number] Epilepsy Attending: [name] Referring Physician/Provider: Erick Blinks, MD  Duration: 05/21/2023 1926 to 05/22/2023 1926 Patient history: 77 y.o. 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 EEG to evaluate for seizure. Level of alertness: Awake, asleep AEDs during EEG study: LEV, LCM Technical aspects: This EEG study was done with scalp electrodes positioned according to the 10-20 International system of electrode placement. Electrical activity was reviewed with band pass filter of 1-70Hz , sensitivity of 7 uV/mm, display speed of 51mm/sec with a 60Hz  notched filter applied as appropriate. EEG data were recorded continuously and digitally stored.  Video monitoring was available and reviewed as appropriate. Description: No clear posterior dominant rhythm was seen. Sleep was characterized by  vertex waves, sleep spindles (12 to 14 Hz), maximal frontocentral region.  EEG showed continuous generalized 3 to 6 Hz theta-delta slowing with overriding 15 to 18 Hz beta activity distributed symmetrically and diffusely.  EEG also showed continuous polymorphic  sharply contoured 3-6 theta- delta slowing admixed in right parietal region consistent with breach artifact. Lateralized periodic discharges were noted in right hemisphere, maximal right parietal region at 1 Hz at times with overriding rhythmicity lasting 5 to 10 seconds without definite evolution consistent with brief ictal event interictal rhythmic discharges.  Hyperventilation and photic stimulation were not performed.    ABNORMALITY -Brief ictal-interictal rhythmic discharges, right parietal region - Breach artifact, right parietal region -Continuous slow, generalized and maximal right parietal region - Excessive beta, generalized  IMPRESSION: This study showed evidence of epileptogenicity arising from right hemisphere, maximal right parietal region which is on the ictal-interictal continuum with increased risk of seizures.  Additionally there is cortical dysfunction in right parietal region consistent with underlying craniotomy. EEG was suggestive of moderate diffuse encephalopathy.  Lastly excessive beta activity is most likely secondary to benzodiazepine use and is a benign EEG pattern. [name]   DG CHEST PORT 1 VIEW  Result Date: 05/21/2023 CLINICAL DATA:  Recent seizure activity EXAM: PORTABLE CHEST 1 VIEW COMPARISON:  02/14/2016 FINDINGS: Cardiac shadow is enlarged but stable. Aortic calcifications are noted. The lungs are clear bilaterally. Postsurgical changes in the right breast are noted. No acute bony abnormality is seen. IMPRESSION: No acute abnormality noted. Electronically Signed   By: Alcide Clever M.D.   On: 05/21/2023 20:47   CT Head Wo Contrast  Result Date: 05/21/2023 CLINICAL DATA:  Mental status change EXAM: CT HEAD  WITHOUT CONTRAST TECHNIQUE: Contiguous axial images were obtained from the base of the skull through the vertex without intravenous contrast. RADIATION DOSE REDUCTION: This exam was performed according to the departmental dose-optimization program which includes automated exposure control, adjustment of the mA and/or kV according to patient size and/or use of iterative reconstruction technique. COMPARISON:  04/02/2023, 02/10/2023 FINDINGS: Brain: No evidence of acute infarction, hemorrhage, extra-axial collection, ventriculomegaly, or mass effect. Right parietal encephalomalacia. Generalized cerebral atrophy. Periventricular white matter low attenuation likely secondary to microangiopathy. Vascular: Cerebrovascular atherosclerotic calcifications are noted. Skull: Negative for fracture or focal lesion. Prior right parietal craniotomy. Sinuses/Orbits: Visualized portions of the orbits are unremarkable. Visualized portions of the paranasal sinuses are unremarkable. Visualized portions of the mastoid air cells are unremarkable. Other: None. IMPRESSION: 1. No acute intracranial pathology. 2. Chronic microvascular disease and cerebral atrophy. Electronically Signed   By: Elige Ko M.D.   On: 05/21/2023 14:53      Assessment/Plan:   Principal Problem:   Seizure [facility])   Patient Summary: Tabitha Ramirez is a 77 year old female with significant PMH of breast cancer (status post resection, chemotherapy and radiation) and brain cancer (status post right parietal craniotomy for tumor resection on 03/11/2022 by Dr. Myer Haff at [facility]), has experienced recurrent breakthrough seizures since the craniotomy. Other past medical history includes hypertension, hyperlipidemia, hypothyroidism, hypokalemia, and iron deficiency anemia. She was admitted on to [facility] for further workup and monitoring after presenting with a breakthrough seizure and postictal changes. Who presented with breakthrough and admitted for  observation.   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 States left arm spasm have abated since last night. Discussion with patients daighter showed preference for one more night of observation as daughter is out of town today plan to d/c  with daughter to home with home health.   - Continue Vimpat 50mg  bid for 2 weeks while patient recovers from Covid19 (In admission note this was labeled as a home medication, the patient does not take this medication at home and this medication is not on the patients medication dispense history). - Schedule neurology appt (Dr. Elissa Hefty) within a week and discuss new medication of Vimpat 50mg  po bid for 14 days and meet with neurologist to discuss this change. - Continue Keppra 1000mg  three times a day  - Neurology signed off. Discontinued LTM EEG. Appreciate neurology recommendations, evaluations and monitoring.  - Tylenol for pain 650mg  Q6 prn  - D/c with home health   # SARS Coronavirus 2 # Recent URI symptoms Patient's RT-PCR from nasopharyngeal swab on 8/26 tested positive for coronavirus, 20 pathogen respiratory panel PCR was negative.  Patient exposed last week to COVID via grandson.  Patient's cough has resolved and patient has continued to saturate well on room air.  Recent COVID virus infection may have precipitated most recent breakthrough seizure however, no need for COVID directed treatment at this time. Chest Xray impressions - No acute abnormality noted.    Chronic Problems:   # HTN On Triamterene 37.5mg /hydrochlorothiazide 25 mg    # Hypothyroidism  On levothyroxine 100 mcg    # HLD On rosuvastatin 10mg     # Chronic Iron Def Anemia On ferrous sulfate  325mg    # Breast Cancer s/p resection, chemo and radiation On 1mg  anastrozole   # Hypokalemia On 10 mEq Potassium Cl   Diet: Normal IVF: None,None VTE: Enoxaparin Code: Full PT/OT recs: Home Health, none. TOC recs: home health Family Update: Daughter updated and agreeable with the plan   Dispo: Anticipated discharge to Home in 1 days pending observation, and home health.   Larrie Kass MS4 Medical Student Pager Number [phone] Please contact the on call pager after 5 pm and on weekends at [phone].

## 2023-05-24 ENCOUNTER — Inpatient Hospital Stay: Payer: Medicare PPO | Admitting: Internal Medicine

## 2023-05-24 DIAGNOSIS — R569 Unspecified convulsions: Secondary | ICD-10-CM | POA: Diagnosis not present

## 2023-05-24 DIAGNOSIS — Z853 Personal history of malignant neoplasm of breast: Secondary | ICD-10-CM | POA: Diagnosis not present

## 2023-05-24 DIAGNOSIS — Z85841 Personal history of malignant neoplasm of brain: Secondary | ICD-10-CM | POA: Diagnosis not present

## 2023-05-24 LAB — GLUCOSE, CAPILLARY: Glucose-Capillary: 108 mg/dL — ABNORMAL HIGH (ref 70–99)

## 2023-05-24 NOTE — [place] FL2 (Addendum)
[place] MEDICAID FL2 LEVEL OF CARE FORM     IDENTIFICATION  Patient Name: Tabitha Ramirez Birthdate: Apr 01, 1946 Sex: female Admission Date (Current Location): 05/21/2023  [facility] [place] and [place] Number:  Producer, television/film/video and Address:         Provider Number: [id number]  Attending Physician Name and Address:  Gust Rung, DO  Relative Name and Phone Number:       Current Level of Care: Hospital Recommended Level of Care: Skilled Nursing Facility Prior Approval Number:    Date Approved/Denied:   PASRR Number: [id number]  Discharge Plan: SNF    Current Diagnoses: Patient Active Problem List   Diagnosis Date Noted   Seizures (HCC) 04/03/2023   Hypothyroidism 04/03/2023   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, right (HCC) 04/13/2022   Brain tumor (HCC) 03/17/2022   Palliative care encounter    Brain mass 03/09/2022   Seizure (HCC) 03/09/2022   Neuropathy 06/11/2014   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   Gait abnormality 01/26/2014   Unspecified hereditary and idiopathic peripheral neuropathy 01/26/2014   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   Neuropathic pain 01/20/2014   Anxiety 01/20/2014   Unspecified deficiency anemia 01/20/2014   Hx of neutropenia 08/04/2013   Jaw pain 08/04/2013   Leukocytosis 04/27/2013   Pericardial effusion 04/27/2013   Cancer of central portion of right female breast (HCC) 02/04/2013   Need for influenza vaccination 11/29/2011   HLD (hyperlipidemia) 03/07/2011   Post-menopausal 03/07/2011   Anemia 12/17/2010   Heart murmur 12/17/2010   Seasonal allergies 12/17/2010   HYPOKALEMIA 02/19/2009   Essential hypertension, benign 04/09/2008    Orientation RESPIRATION BLADDER Height & Weight     Self, Time, Situation, Place  Normal Incontinent Weight: 137 lb 12.6 oz (62.5 kg) Height:  5\' 3"  (160 cm)  BEHAVIORAL SYMPTOMS/MOOD NEUROLOGICAL BOWEL NUTRITION STATUS     Convulsions/Seizures Continent Diet (LOW SODIUM HEART HEALTHY)  AMBULATORY STATUS COMMUNICATION OF NEEDS Skin   Extensive Assist Verbally Normal                       Personal Care Assistance Level of Assistance  Bathing, Feeding, Dressing Bathing Assistance: Maximum assistance Feeding assistance: Limited assistance Dressing Assistance: Maximum assistance     Functional Limitations Info             SPECIAL CARE FACTORS FREQUENCY  PT (By licensed PT), OT (By licensed OT)     PT Frequency: 5x/wk OT Frequency: 5x/wk            Contractures Contractures Info: Not present    Additional Factors Info  Code Status, Allergies, Isolation Precautions Code Status Info: Full Allergies Info: Penicillins, Nsaids     Isolation Precautions Info: Airborne/contact: COVID+     Current Medications (05/24/2023):  This is the current hospital active medication list Current Facility-Administered Medications  Medication Dose Route Frequency Provider Last Rate Last Admin   acetaminophen (TYLENOL) tablet 325-650 mg  325-650 mg Oral Q6H PRN Rocky Morel, DO   650 mg at 05/24/23 1010   anastrozole (ARIMIDEX) tablet 1 mg  1 mg Oral Daily Rocky Morel, DO   1 mg at 05/24/23 1003   cholecalciferol (VITAMIN D3) 25 MCG (1000 UNIT) tablet 1,000 Units  1,000 Units Oral Daily Rocky Morel, DO   1,000 Units at 05/24/23 1002   cyanocobalamin (VITAMIN B12) tablet 500 mcg  500 mcg Oral Daily Rocky Morel, DO   500 mcg at 05/24/23 1003  enoxaparin (LOVENOX) injection 40 mg  40 mg Subcutaneous Q24H Rocky Morel, DO   40 mg at 05/23/23 1753   feeding supplement (ENSURE ENLIVE / ENSURE PLUS) liquid 237 mL  237 mL Oral BID BM Gust Rung, DO   237 mL at 05/24/23 1002   ferrous sulfate tablet 325 mg  325 mg Oral Q breakfast Rocky Morel, DO   325 mg at 05/24/23 4403   lacosamide (VIMPAT) tablet 50 mg  50 mg Oral BID Erick Blinks, MD   50 mg at 05/24/23 1003   levETIRAcetam  (KEPPRA) tablet 1,000 mg  1,000 mg Oral TID Carlynn Purl C, DO   1,000 mg at 05/24/23 1002   levothyroxine (SYNTHROID) tablet 100 mcg  100 mcg Oral QAC breakfast Rocky Morel, DO   100 mcg at 05/24/23 4742   rosuvastatin (CRESTOR) tablet 10 mg  10 mg Oral Daily Rocky Morel, DO   10 mg at 05/24/23 1002   triamterene-hydrochlorothiazide (MAXZIDE-25) 37.5-25 MG per tablet 1 tablet  1 tablet Oral Daily Rocky Morel, DO   1 tablet at 05/24/23 1003     Discharge Medications: Please see discharge summary for a list of discharge medications.  Relevant Imaging Results:  Relevant Lab Results:   Additional Information SS#: [id number]  Antion [name] Emory, Student-Social Work

## 2023-05-24 NOTE — Evaluation (Signed)
Occupational Therapy Evaluation Patient Details Name: Tabitha Ramirez MRN: [id number] DOB: 01-29-1946 Today's Date: 05/24/2023   History of Present Illness 77 y.o. female presents to [facility] hospital on 05/21/2023 after witnessed seizure. Pt also found to be COVID+. PMH includes breast CA with brain mets s/p resection, HTN, HLD, hypothyroidism.   Clinical Impression   PTA, pt lives with family, typically Modified Independent with ADLs and mobility using cane vs RW. Pt presents now with deficits in standing balance, L UE coordination, strength and safety awareness. Pt with significant difficulties standing and initiating transfers today - ultimately requiring use of Stedy for BSC/chair transfers. Pt requires Min A for UB ADL and Max A (+2 if in standing) for LB ADLs. Based on deficits, recommended continued postacute therapy follow up at [facility].      If plan is discharge home, recommend the following: Two people to help with walking and/or transfers;A lot of help with walking and/or transfers;A lot of help with bathing/dressing/bathroom;Two people to help with bathing/dressing/bathroom    Functional Status Assessment  Patient has had a recent decline in their functional status and demonstrates the ability to make significant improvements in function in a reasonable and predictable amount of time.  Equipment Recommendations  Wheelchair cushion (measurements OT);Wheelchair (measurements OT)    Recommendations for Other Services       Precautions / Restrictions Precautions Precautions: Fall Precaution Comments: seizures Restrictions Weight Bearing Restrictions: No      Mobility Bed Mobility Overal bed mobility: Needs Assistance Bed Mobility: Supine to Sit     Supine to sit: Min assist, HOB elevated     General bed mobility comments: light assist to scoot hips to EOB    Transfers Overall transfer level: Needs assistance Equipment used: Rolling walker (2 wheels), 1 person hand held  assist, Ambulation equipment used Transfers: Sit to/from Stand, Bed to chair/wheelchair/BSC Sit to Stand: Max assist           General transfer comment: Attempted stand with RW then handheld assist with both requiring Max A and posterior bias noted. poor ability to maintain L hand grasp. attempted to initiate pivot to [facility] though pt unsafely reaching with LUE and unable to stabilize to take steps. Used Stedy for [facility] transfer with overall Mod A needed to stand Transfer via Lift Equipment: Stedy    Balance Overall balance assessment: Needs assistance Sitting-balance support: No upper extremity supported, Feet supported Sitting balance-Leahy Scale: Fair     Standing balance support: Single extremity supported Standing balance-Leahy Scale: Poor                             ADL either performed or assessed with clinical judgement   ADL Overall ADL's : Needs assistance/impaired Eating/Feeding: Set up   Grooming: Set up;Sitting;Wash/dry face Grooming Details (indicate cue type and reason): L hand incoordination manipulating washcloth but able to manage Upper Body Bathing: Minimal assistance   Lower Body Bathing: Maximal assistance;+2 for safety/equipment;Sit to/from stand;Sitting/lateral leans   Upper Body Dressing : Minimal assistance;Sitting   Lower Body Dressing: Maximal assistance;+2 for physical assistance;+2 for safety/equipment;Sit to/from stand;Sitting/lateral leans     Toilet Transfer Details (indicate cue type and reason): use of Stedy to/from [facility]. pt unable to safely sequence steps to [facility] with +1 assist to attempt pivot Toileting- Clothing Manipulation and Hygiene: Maximal assistance;+2 for physical assistance;+2 for safety/equipment;Sitting/lateral lean;Sit to/from stand Toileting - Clothing Manipulation Details (indicate cue type and reason): +1 in [place]  though difficult, assisted with peri care       General ADL Comments: Limited by impaired balance, safety  awareness, L UE ataxia     Vision Ability to See in Adequate Light: 0 Adequate Patient Visual Report: No change from baseline Vision Assessment?: No apparent visual deficits     Perception         Praxis         Pertinent Vitals/Pain Pain Assessment Pain Assessment: No/denies pain     Extremity/Trunk Assessment Upper Extremity Assessment Upper Extremity Assessment: LUE deficits/detail LUE Deficits / Details: ataxia, ROM WFL, difficulty grasping. asked if LUE function at baseline with pt reporting "it sometimes gives me trouble" LUE Coordination: decreased fine motor;decreased gross motor   Lower Extremity Assessment Lower Extremity Assessment: Defer to PT evaluation   Cervical / Trunk Assessment Cervical / Trunk Assessment: Normal   Communication Communication Communication: No apparent difficulties   Cognition Arousal: Alert Behavior During Therapy: Flat affect Overall Cognitive Status: Impaired/Different from baseline Area of Impairment: Awareness, Safety/judgement, Problem solving                         Safety/Judgement: Decreased awareness of safety, Decreased awareness of deficits Awareness: Emergent Problem Solving: Slow processing, Difficulty sequencing General Comments: cues for safety needed, poor insight into deficits     General Comments       Exercises     Shoulder Instructions      Home Living Family/patient expects to be discharged to:: Private residence Living Arrangements: Children;Other (Comment) (daughter, son in law and grandson) Available Help at Discharge: Family;Available 24 hours/day Type of Home: House Home Access: Stairs to enter [employer] of Steps: 2 Entrance Stairs-Rails: Can reach both Home Layout: One level     Bathroom Shower/Tub: Chief Strategy Officer: Standard     Home Equipment: Agricultural consultant (2 wheels);Cane - single point;BSC/3in1;Grab bars - tub/shower;Hand held shower  head;Tub bench;Shower seat          Prior Functioning/Environment Prior Level of Function : Needs assist             Mobility Comments: reports that she uses her cane in the community and RW vs cane in the home ADLs Comments: Due to neuropathy, pt does need help opening tight or new jars. Reports she completes all BADL tasks without assistance.        OT Problem List: Decreased strength;Decreased activity tolerance;Impaired balance (sitting and/or standing);Decreased coordination;Decreased cognition;Decreased safety awareness;Decreased knowledge of use of DME or AE;Impaired UE functional use      OT Treatment/Interventions: Self-care/ADL training;Therapeutic exercise;Energy conservation;DME and/or AE instruction;Therapeutic activities;Patient/family education    OT Goals(Current goals can be found in the care plan section) Acute Rehab OT Goals Patient Stated Goal: go home soon OT Goal Formulation: With patient Time For Goal Achievement: 06/07/23 Potential to Achieve Goals: Good ADL Goals Pt Will Perform Lower Body Bathing: with min assist;sit to/from stand;sitting/lateral leans Pt Will Perform Lower Body Dressing: with min assist;sit to/from stand;sitting/lateral leans Pt Will Transfer to Toilet: with min assist;stand pivot transfer;bedside commode  OT Frequency: Min 1X/week    Co-evaluation              AM-PAC OT "6 Clicks" Daily Activity     Outcome Measure Help from another person eating meals?: A Little Help from another person taking care of personal grooming?: A Little Help from another person toileting, which includes using toliet, bedpan, or urinal?: A Lot  Help from another person bathing (including washing, rinsing, drying)?: A Lot Help from another person to put on and taking off regular upper body clothing?: A Little Help from another person to put on and taking off regular lower body clothing?: A Lot 6 Click Score: 15   End of Session Equipment Utilized  During Treatment: Gait belt Nurse Communication: Mobility status  Activity Tolerance: Patient tolerated treatment well Patient left: in chair;with call bell/phone within reach;with chair alarm set  OT Visit Diagnosis: Unsteadiness on feet (R26.81);Other abnormalities of gait and mobility (R26.89);Muscle weakness (generalized) (M62.81);Other symptoms and signs involving cognitive function                Time: 2130-8657 OT Time Calculation (min): 39 min Charges:  OT General Charges $OT Visit: 1 Visit OT Evaluation $OT Eval Moderate Complexity: 1 Mod OT Treatments $Self Care/Home Management : 8-22 mins $Therapeutic Activity: 8-22 mins  Bradd Canary, OTR/L Acute Rehab Services Office: [phone]   Lorre Munroe 05/24/2023, 9:05 AM

## 2023-05-24 NOTE — TOC Initial Note (Signed)
Transition of Care [facility]) - Initial/Assessment Note    Patient Details  Name: Tabitha Ramirez MRN: [id number] Date of Birth: 1945-10-11  Transition of [facility]) CM/SW Contact:    Baldemar Lenis, LCSW Phone Number: 05/24/2023, 2:48 PM  Clinical Narrative:     CSW noting recommendation for SNF. Per MD, patient in agreement, requesting SNF in [place]. CSW unable to reach patient via phone in room, spoke with daughter, [name]. Patient has been at [facility] in [facility] in the past years ago as she was then going to stay with the daughter in [place], but patient and family are open to SNF in [place]. CSW discussed barrier of patient being COVID+ and unsure of SNF in [place] that would accept patient, but there is a SNF in [place] that will. Family in agreement, will review [facility]. CSW completed referral and sent to [facility] for them to review, will follow.              Expected Discharge Plan: Skilled Nursing Facility Barriers to Discharge: Continued Medical Work up, English as a second language teacher, SNF Covid   Patient Goals and CMS Choice Patient states their goals for this hospitalization and ongoing recovery are:: go home soon CMS Medicare.gov Compare Post Acute Care list provided to:: Patient Choice offered to / list presented to : Patient, Adult Children [place] ownership interest in [facility].provided to:: Adult Children    Expected Discharge Plan and Services     Post Acute Care Choice: Skilled Nursing Facility Living arrangements for the past 2 months: Single Family Home                                      Prior Living Arrangements/Services Living arrangements for the past 2 months: Single Family Home Lives with:: Self Patient language and need for interpreter reviewed:: No Do you feel safe going back to the place where you live?: Yes      Need for Family Participation in Patient Care: No (Comment) Care giver  support system in place?: No (comment)   Criminal Activity/Legal Involvement Pertinent to Current Situation/Hospitalization: No - Comment as needed  Activities of Daily Living Home Assistive Devices/Equipment: Dan Humphreys (specify type) ADL Screening (condition at time of admission) Patient's cognitive ability adequate to safely complete daily activities?: Yes Is the patient deaf or have difficulty hearing?: No Does the patient have difficulty seeing, even when wearing glasses/contacts?: No Does the patient have difficulty concentrating, remembering, or making decisions?: Yes Patient able to express need for assistance with ADLs?: Yes Does the patient have difficulty dressing or bathing?: No Independently performs ADLs?: Yes (appropriate for developmental age) (needs assistance cooking) Does the patient have difficulty walking or climbing stairs?: Yes Weakness of Legs: None Weakness of Arms/Hands: None  Permission Sought/Granted Permission sought to share information with : Facility Medical sales representative, Family Supports Permission granted to share information with : Yes, Verbal Permission Granted  Share Information with NAME: [name]  Permission granted to share info w AGENCY: SNF  Permission granted to share info w Relationship: Daughter     Emotional Assessment Appearance:: Appears stated age Attitude/Demeanor/Rapport: Engaged Affect (typically observed): Appropriate Orientation: : Oriented to Self, Oriented to Place, Oriented to  Time, Oriented to Situation Alcohol / Substance Use: Not Applicable Psych Involvement: No (comment)  Admission diagnosis:  Seizure [facility]) [R56.9] Patient Active Problem List   Diagnosis Date Noted   Seizures (HCC)  04/03/2023   Hypothyroidism 04/03/2023   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, right (HCC) 04/13/2022   Brain tumor (HCC) 03/17/2022   Palliative care encounter    Brain mass 03/09/2022   Seizure (HCC) 03/09/2022   Neuropathy  06/11/2014   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   Gait abnormality 01/26/2014   Unspecified hereditary and idiopathic peripheral neuropathy 01/26/2014   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   Neuropathic pain 01/20/2014   Anxiety 01/20/2014   Unspecified deficiency anemia 01/20/2014   Hx of neutropenia 08/04/2013   Jaw pain 08/04/2013   Leukocytosis 04/27/2013   Pericardial effusion 04/27/2013   Cancer of central portion of right female breast (HCC) 02/04/2013   Need for influenza vaccination 11/29/2011   HLD (hyperlipidemia) 03/07/2011   Post-menopausal 03/07/2011   Anemia 12/17/2010   Heart murmur 12/17/2010   Seasonal allergies 12/17/2010   HYPOKALEMIA 02/19/2009   Essential hypertension, benign 04/09/2008   PCP:  [facility]:   [id number] [place], [place] - [address] AT [facility] OF [address] [place] [address] Phone: [phone] Fax: [phone]  [facility] [phone] - Closed - [place], [place] - [address] [place] [address] Phone: [phone] Fax: [phone]  [facility] [address] [place] [address] Phone: [phone] Fax: [phone]     Social Determinants of Health (SDOH) Social History: SDOH Screenings   Food Insecurity: No Food Insecurity (05/21/2023)  Housing: Low Risk  (05/21/2023)  Transportation Needs: No Transportation Needs (05/21/2023)  Utilities: Not At Risk (05/21/2023)  Depression (PHQ2-9): Low Risk  (12/27/2022)  Tobacco Use: Medium Risk (05/21/2023)   SDOH Interventions:     Readmission Risk Interventions     No data to display

## 2023-05-24 NOTE — Plan of Care (Signed)

## 2023-05-24 NOTE — Progress Notes (Signed)
Subjective:   Summary: Tabitha Ramirez is a 77 y.o. year old female currently admitted on the IMTS HD#4 for breakthrough seizure management and workup.  Overnight Events:   Patient states that her weakness has been slowly getting better however she is still having difficulties with walking.  She denies any pain and states difficulties walking due to weakness.  Patient endorsed low back arthritic pain that is exacerbated by cold temperatures and is relieved when she has blankets on.  Denied any chest pain, abdominal pain or shortness of breath. She mentioned her daughter should be by to visit her sometime today.   Objective:  Vital signs in last 24 hours: Vitals:   05/23/23 1958 05/23/23 2350 05/24/23 0327 05/24/23 0814  BP: (!) 100/53 114/65 (!) 90/58 (!) 99/59  Pulse: 82 82 93 85  Resp: 18 17 18 18   Temp: 97.9 F (36.6 C) 97.8 F (36.6 C) 98.9 F (37.2 C) 98.2 F (36.8 C)  TempSrc: Oral  Oral Oral  SpO2: 100% 100% 97% 97%  Weight:      Height:       Supplemental O2: Room Air SpO2: 97 %   Physical Exam:  Constitutional: well-appearing female sitting up in her chair eating breakfast, in no acute distress Cardiovascular: RRR, murmur present, without rubs or gallops Pulmonary/Chest: normal work of breathing on room air, lungs clear to auscultation bilaterally Abdominal: soft, non-tender, non-distended Skin: warm and dry Extremities: upper/lower extremity pulses 2+, no lower extremity edema present MSK: There is still muscle strength weakness on patient's left side.  Filed Weights   05/21/23 1243 05/22/23 0500 05/23/23 0500  Weight: 61 kg 63.3 kg 62.5 kg     Intake/Output Summary (Last 24 hours) at 05/24/2023 1124 Last data filed at 05/24/2023 0330 Gross per 24 hour  Intake 240 ml  Output 1000 ml  Net -760 ml   Net IO Since Admission: -1,203 mL [05/24/23 1124]  Pertinent Labs:    Latest Ref Rng & Units 05/23/2023    6:30 AM 05/22/2023    4:04 AM  05/21/2023   12:55 PM  CBC  WBC 4.0 - 10.5 K/uL 11.3  7.5  10.2   Hemoglobin 12.0 - 15.0 g/dL 24.4  9.8  01.0   Hematocrit 36.0 - 46.0 % 30.0  28.8  31.9   Platelets 150 - 400 K/uL 128  270  272        Latest Ref Rng & Units 05/23/2023    6:30 AM 05/22/2023    4:04 AM 05/21/2023   12:55 PM  CMP  Glucose 70 - 99 mg/dL 272  65  536   BUN 8 - 23 mg/dL 13  11  12    Creatinine 0.44 - 1.00 mg/dL 6.44  0.34  7.42   Sodium 135 - 145 mmol/L 137  139  138   Potassium 3.5 - 5.1 mmol/L 3.9  3.2  3.7   Chloride 98 - 111 mmol/L 104  103  103   CO2 22 - 32 mmol/L 25  24  24    Calcium 8.9 - 10.3 mg/dL 8.9  9.0  9.7     I personally reviewed interval labs and pertinent results include: No new morning labs    Assessment/Plan:   Principal Problem:   Seizure [facility])   Patient Summary: Tabitha Ramirez is a 77 year old female with significant PMH of breast cancer (status post resection, chemotherapy  and radiation) and brain cancer (status post right parietal craniotomy for tumor resection on 03/11/2022 by Dr. 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 Other past medical history includes hypertension, hyperlipidemia, hypothyroidism, hypokalemia, and iron deficiency anemia. She was admitted on to [facility] for further workup and monitoring after presenting with a breakthrough seizure and postictal changes. Who presented with breakthrough and admitted for observation.    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 States left arm spasm have abated since last night. Discussion with patients daighter showed preference for one more night of observation as daughter is out of town today  plan to d/c with daughter to home with home health. Neurology signed off on 8/28 and discontinued LTM EEG. Appreciated neurology recommendations, evaluations and monitoring.   - Continue Vimpat 50mg  bid for 2 weeks while patient recovers from Covid19 (from 8/28 until 06/06/19/2024) - Schedule neurology appt (Dr. Elissa Hefty) within a week and discuss new medication of Vimpat 50mg  po bid for 14 days and meet with neurologist to discuss this change. - Continue Keppra 1000mg  three times a day indefinitely. - Tylenol for pain 650mg  Q6 prn  - Discharge to skilled nursing facility for acute inpatient rehabilitation   # SARS Coronavirus 2 # Recent URI symptoms Patient's RT-PCR from nasopharyngeal swab on 8/26 tested positive for coronavirus, 20 pathogen respiratory panel PCR was negative.  Patient exposed last week to COVID via grandson.  Patient's cough has resolved and patient has continued to saturate well on room air.  Recent COVID virus infection may have precipitated most recent breakthrough seizure however, no need for COVID directed treatment at this time. Chest Xray impressions - No acute abnormality noted. - Patient has continued to have no respiratory complaints and has continued to saturate 100% on room air   Chronic Problems:   # HTN On Triamterene 37.5mg /hydrochlorothiazide 25 mg    # Hypothyroidism  On levothyroxine 100 mcg    # HLD On rosuvastatin 10mg     # Chronic Iron Def Anemia On ferrous sulfate 325mg    # Breast Cancer s/p resection, chemo and radiation On 1mg  anastrozole   # Hypokalemia On 10 mEq Potassium Cl   Diet: Normal IVF: None,None VTE: Enoxaparin Code: Full PT/OT recs: SNF for Subacute PT, wheelchair. TOC recs: SNF pending insurance authorization, bed placement Family Update:   Dispo: Anticipated discharge to Skilled nursing facility in 1 days pending SNF placement.   Larrie Kass MS4 Medical Student Pager Number [phone] Please contact the on  call pager after 5 pm and on weekends at [phone].

## 2023-05-24 NOTE — Progress Notes (Signed)
Physical Therapy Treatment  Patient Details Name: Tabitha Ramirez MRN: [id number] DOB: 08-Jan-1946 Today's Date: 05/24/2023   History of Present Illness 77 y.o. female presents to [facility] hospital on 05/21/2023 after witnessed seizure. Pt also found to be COVID+. PMH includes breast CA with brain mets s/p resection, HTN, HLD, hypothyroidism.    PT Comments  Pt progressing towards physical therapy goals. Was able to perform transfers with the [facility] with gross mod assist. Without the [facility] pt grossly requires max assist with the RW. Continue to recommend post-acute rehab <3 hours/day at d/c.     If plan is discharge home, recommend the following: A lot of help with walking and/or transfers;A lot of help with bathing/dressing/bathroom;Assistance with cooking/housework;Assistance with feeding;Assist for transportation;Direct supervision/assist for medications management;Direct supervision/assist for financial management;Help with stairs or ramp for entrance;Supervision due to cognitive status   Can travel by private vehicle     No  Equipment Recommendations   (TBD pending progress)    Recommendations for Other Services Rehab consult     Precautions / Restrictions Precautions Precautions: Fall Precaution Comments: seizures Restrictions Weight Bearing Restrictions: No     Mobility  Bed Mobility Overal bed mobility: Needs Assistance Bed Mobility: Sit to Supine       Sit to supine: Mod assist, Max assist   General bed mobility comments: Assist for trunk guidance down to sidelying and for LE elevation up into bed. Pt with minimal participation with sit>sidelying.    Transfers Overall transfer level: Needs assistance Equipment used: Rolling walker (2 wheels), 1 person hand held assist, Ambulation equipment used Transfers: Sit to/from Stand, Bed to chair/wheelchair/BSC Sit to Stand: Mod assist           General transfer comment: Antony Salmon utilized for transition to [facility] and then to bed.  Pt required mod assist with power up when pulling from center bar of Stedy. When attempting without pulling anteriorly from center bar, pt requires max assist. Transfer via Lift Equipment: Stedy  Ambulation/Gait               General Gait Details: Unable to ambulate at this time.   Stairs             Wheelchair Mobility     Tilt Bed    Modified Rankin (Stroke Patients Only)       Balance Overall balance assessment: Needs assistance Sitting-balance support: No upper extremity supported, Feet supported Sitting balance-Leahy Scale: Fair     Standing balance support: Single extremity supported Standing balance-Leahy Scale: Poor                              Cognition Arousal: Alert Behavior During Therapy: Flat affect Overall Cognitive Status: Impaired/Different from baseline Area of Impairment: Awareness, Safety/judgement, Problem solving                         Safety/Judgement: Decreased awareness of safety, Decreased awareness of deficits Awareness: Emergent Problem Solving: Slow processing, Difficulty sequencing General Comments: cues for safety needed, poor insight into deficits        Exercises      General Comments        Pertinent Vitals/Pain Pain Assessment Pain Assessment: Faces Faces Pain Scale: Hurts a little bit Pain Location: back Pain Descriptors / Indicators: Sore, Aching Pain Intervention(s): Limited activity within patient's tolerance, Monitored during session, Repositioned    Home Living  Prior Function            PT Goals (current goals can now be found in the care plan section) Acute Rehab PT Goals Patient Stated Goal: to return to prior level of function PT Goal Formulation: With patient Time For Goal Achievement: 06/05/23 Potential to Achieve Goals: Fair Progress towards PT goals: Progressing toward goals    Frequency    Min 1X/week      PT Plan       Co-evaluation              AM-PAC PT "6 Clicks" Mobility   Outcome Measure  Help needed turning from your back to your side while in a flat bed without using bedrails?: A Little Help needed moving from lying on your back to sitting on the side of a flat bed without using bedrails?: A Lot Help needed moving to and from a bed to a chair (including a wheelchair)?: A Lot Help needed standing up from a chair using your arms (e.g., wheelchair or bedside chair)?: A Lot Help needed to walk in hospital room?: Total Help needed climbing 3-5 steps with a railing? : Total 6 Click Score: 11    End of Session Equipment Utilized During Treatment: Gait belt Activity Tolerance: Patient tolerated treatment well Patient left: in bed;with call bell/phone within reach;with bed alarm set Nurse Communication: Mobility status PT Visit Diagnosis: Other abnormalities of gait and mobility (R26.89);Muscle weakness (generalized) (M62.81);Other symptoms and signs involving the nervous system (R29.898)     Time: 1610-9604 PT Time Calculation (min) (ACUTE ONLY): 29 min  Charges:    $Therapeutic Activity: 8-22 mins                       Conni Slipper, PT, DPT Acute Rehabilitation Services Secure Chat Preferred Office: [phone]    Marylynn Pearson 05/24/2023, 1:47 PM

## 2023-05-25 ENCOUNTER — Other Ambulatory Visit (HOSPITAL_COMMUNITY): Payer: Self-pay

## 2023-05-25 DIAGNOSIS — U071 COVID-19: Secondary | ICD-10-CM

## 2023-05-25 DIAGNOSIS — R569 Unspecified convulsions: Secondary | ICD-10-CM | POA: Diagnosis not present

## 2023-05-25 LAB — GLUCOSE, CAPILLARY: Glucose-Capillary: 125 mg/dL — ABNORMAL HIGH (ref 70–99)

## 2023-05-25 MED ORDER — LORAZEPAM 0.5 MG PO TABS: 0.5000 mg | ORAL_TABLET | Freq: Every day | ORAL | 0 refills | Status: DC | PRN

## 2023-05-25 MED ORDER — LACOSAMIDE 50 MG PO TABS: 50.0000 mg | ORAL_TABLET | Freq: Two times a day (BID) | ORAL | 0 refills | Status: DC

## 2023-05-25 MED ORDER — LACOSAMIDE 50 MG PO TABS
50.0000 mg | ORAL_TABLET | Freq: Two times a day (BID) | ORAL | 0 refills | Status: DC
  Filled 2023-05-25: qty 2, 1d supply, fill #0

## 2023-05-25 MED ORDER — LACOSAMIDE 50 MG PO TABS: 50.0000 mg | ORAL_TABLET | Freq: Two times a day (BID) | ORAL | Status: DC

## 2023-05-25 MED ORDER — OXYCODONE-ACETAMINOPHEN 5-325 MG PO TABS
1.0000 | ORAL_TABLET | Freq: Four times a day (QID) | ORAL | 0 refills | Status: AC | PRN
Start: 2023-05-25 — End: ?

## 2023-05-25 NOTE — Progress Notes (Signed)
Report has been called to [facility] at 1203 to [place], [place].   Patient is ready for pickup and is dressed in her clothes; awaiting for transportation. Daughter is aware of the discharge this morning.

## 2023-05-25 NOTE — Plan of Care (Signed)

## 2023-05-25 NOTE — TOC Transition Note (Signed)
Transition of Care [facility]) - CM/SW Discharge Note   Patient Details  Name: Tabitha Ramirez MRN: [id number] Date of Birth: 1946-06-08  Transition of [facility] CM/SW Contact:  Baldemar Lenis, LCSW Phone Number: 05/25/2023, 12:28 PM   Clinical Narrative:   CSW received insurance authorization for patient to admit to SNF, and [facility] has bed available. CSW updated MD, sent discharge summary to [facility]. CSW updated daughter, [name], via phone, she is in agreement. Transport arranged with [facility] for next available.  Nurse to call report to [phone], Room 114.    Final next level of care: Skilled Nursing Facility Barriers to Discharge: Barriers Resolved   Patient Goals and CMS Choice CMS Medicare.gov Compare Post Acute Care list provided to:: Patient Choice offered to / list presented to : Patient, Adult Children  Discharge Placement                Patient chooses bed at: [facility] Patient to be transferred to facility by: [facility] Name of family member notified: [name] Patient and family notified of of transfer: 05/25/23  Discharge Plan and Services Additional resources added to the After Visit Summary for       Post Acute Care Choice: Skilled Nursing Facility                               Social Determinants of Health (SDOH) Interventions SDOH Screenings   Food Insecurity: No Food Insecurity (05/21/2023)  Housing: Low Risk  (05/21/2023)  Transportation Needs: No Transportation Needs (05/21/2023)  Utilities: Not At Risk (05/21/2023)  Depression (PHQ2-9): Low Risk  (12/27/2022)  Tobacco Use: Medium Risk (05/21/2023)     Readmission Risk Interventions     No data to display

## 2023-05-30 ENCOUNTER — Ambulatory Visit: Payer: Medicare PPO | Admitting: Urology

## 2023-06-08 ENCOUNTER — Other Ambulatory Visit: Payer: Self-pay | Admitting: Nurse Practitioner

## 2023-06-11 ENCOUNTER — Telehealth: Payer: Self-pay | Admitting: *Deleted

## 2023-06-11 NOTE — Telephone Encounter (Signed)
Attempted to reach patient to reschedule her visit with Dr Barbaraann Cao to after her scheduled MRI date.  Left message pending call back.

## 2023-06-14 ENCOUNTER — Other Ambulatory Visit: Payer: Medicare PPO

## 2023-06-14 ENCOUNTER — Telehealth: Payer: Self-pay | Admitting: *Deleted

## 2023-06-14 NOTE — Telephone Encounter (Signed)
[name], [name], returned call to this office - informed her Ms Goldring' appointment on 06/19/23 needs to be changed until after her MRI on 06/20/23.  [name] states they can come on the 30th, appointment changed to 05/3023 at 11:00.  She verbalizes understanding.

## 2023-06-15 ENCOUNTER — Telehealth: Payer: Self-pay | Admitting: Nurse Practitioner

## 2023-06-19 ENCOUNTER — Ambulatory Visit: Payer: Medicare PPO | Admitting: Internal Medicine

## 2023-06-20 ENCOUNTER — Ambulatory Visit: Payer: Medicare PPO | Admitting: Urology

## 2023-06-20 ENCOUNTER — Ambulatory Visit
Admission: RE | Admit: 2023-06-20 | Discharge: 2023-06-20 | Disposition: A | Discharge status: Home / Self Care | Payer: Medicare PPO | Source: Ambulatory Visit | Attending: Radiation Oncology | Admitting: Radiation Oncology

## 2023-06-20 DIAGNOSIS — C7931 Secondary malignant neoplasm of brain: Secondary | ICD-10-CM

## 2023-06-20 MED ORDER — GADOPICLENOL 0.5 MMOL/ML IV SOLN
7.0000 mL | Freq: Once | INTRAVENOUS | Status: AC | PRN
  Administered 2023-06-20: 7 mL via INTRAVENOUS

## 2023-06-21 ENCOUNTER — Other Ambulatory Visit: Payer: Self-pay | Admitting: *Deleted

## 2023-06-21 ENCOUNTER — Inpatient Hospital Stay (HOSPITAL_COMMUNITY)
Admission: EM | Admit: 2023-06-21 | Discharge: 2023-06-26 | DRG: 101 | Disposition: A | Discharge status: Skilled Nursing Facility | Payer: Medicare PPO | Attending: Infectious Diseases | Admitting: Infectious Diseases

## 2023-06-21 ENCOUNTER — Other Ambulatory Visit: Payer: Self-pay

## 2023-06-21 ENCOUNTER — Telehealth: Payer: Self-pay | Admitting: *Deleted

## 2023-06-21 ENCOUNTER — Other Ambulatory Visit: Payer: Self-pay | Admitting: Internal Medicine

## 2023-06-21 ENCOUNTER — Encounter (HOSPITAL_COMMUNITY): Payer: Self-pay

## 2023-06-21 ENCOUNTER — Emergency Department (HOSPITAL_COMMUNITY): Payer: Medicare PPO

## 2023-06-21 DIAGNOSIS — Z79890 Hormone replacement therapy (postmenopausal): Secondary | ICD-10-CM

## 2023-06-21 DIAGNOSIS — C50911 Malignant neoplasm of unspecified site of right female breast: Secondary | ICD-10-CM | POA: Diagnosis present

## 2023-06-21 DIAGNOSIS — I10 Essential (primary) hypertension: Secondary | ICD-10-CM | POA: Diagnosis present

## 2023-06-21 DIAGNOSIS — G40409 Other generalized epilepsy and epileptic syndromes, not intractable, without status epilepticus: Principal | ICD-10-CM | POA: Diagnosis present

## 2023-06-21 DIAGNOSIS — N644 Mastodynia: Secondary | ICD-10-CM | POA: Diagnosis present

## 2023-06-21 DIAGNOSIS — G514 Facial myokymia: Secondary | ICD-10-CM | POA: Insufficient documentation

## 2023-06-21 DIAGNOSIS — K219 Gastro-esophageal reflux disease without esophagitis: Secondary | ICD-10-CM | POA: Diagnosis present

## 2023-06-21 DIAGNOSIS — R7989 Other specified abnormal findings of blood chemistry: Secondary | ICD-10-CM | POA: Diagnosis present

## 2023-06-21 DIAGNOSIS — Z880 Allergy status to penicillin: Secondary | ICD-10-CM

## 2023-06-21 DIAGNOSIS — Z515 Encounter for palliative care: Secondary | ICD-10-CM

## 2023-06-21 DIAGNOSIS — G8194 Hemiplegia, unspecified affecting left nondominant side: Secondary | ICD-10-CM | POA: Diagnosis present

## 2023-06-21 DIAGNOSIS — Z23 Encounter for immunization: Secondary | ICD-10-CM

## 2023-06-21 DIAGNOSIS — G893 Neoplasm related pain (acute) (chronic): Secondary | ICD-10-CM

## 2023-06-21 DIAGNOSIS — Z833 Family history of diabetes mellitus: Secondary | ICD-10-CM

## 2023-06-21 DIAGNOSIS — G9389 Other specified disorders of brain: Secondary | ICD-10-CM

## 2023-06-21 DIAGNOSIS — Z923 Personal history of irradiation: Secondary | ICD-10-CM

## 2023-06-21 DIAGNOSIS — Z886 Allergy status to analgesic agent status: Secondary | ICD-10-CM

## 2023-06-21 DIAGNOSIS — G40909 Epilepsy, unspecified, not intractable, without status epilepticus: Principal | ICD-10-CM | POA: Diagnosis present

## 2023-06-21 DIAGNOSIS — Z66 Do not resuscitate: Secondary | ICD-10-CM | POA: Diagnosis present

## 2023-06-21 DIAGNOSIS — Z7901 Long term (current) use of anticoagulants: Secondary | ICD-10-CM

## 2023-06-21 DIAGNOSIS — Z90710 Acquired absence of both cervix and uterus: Secondary | ICD-10-CM

## 2023-06-21 DIAGNOSIS — Z9011 Acquired absence of right breast and nipple: Secondary | ICD-10-CM

## 2023-06-21 DIAGNOSIS — D630 Anemia in neoplastic disease: Secondary | ICD-10-CM | POA: Diagnosis present

## 2023-06-21 DIAGNOSIS — R569 Unspecified convulsions: Secondary | ICD-10-CM | POA: Diagnosis not present

## 2023-06-21 DIAGNOSIS — Z79811 Long term (current) use of aromatase inhibitors: Secondary | ICD-10-CM

## 2023-06-21 DIAGNOSIS — J9811 Atelectasis: Secondary | ICD-10-CM | POA: Diagnosis present

## 2023-06-21 DIAGNOSIS — C50919 Malignant neoplasm of unspecified site of unspecified female breast: Secondary | ICD-10-CM | POA: Diagnosis present

## 2023-06-21 DIAGNOSIS — Z9221 Personal history of antineoplastic chemotherapy: Secondary | ICD-10-CM

## 2023-06-21 DIAGNOSIS — Z803 Family history of malignant neoplasm of breast: Secondary | ICD-10-CM

## 2023-06-21 DIAGNOSIS — Z79899 Other long term (current) drug therapy: Secondary | ICD-10-CM

## 2023-06-21 DIAGNOSIS — R29810 Facial weakness: Secondary | ICD-10-CM | POA: Diagnosis present

## 2023-06-21 DIAGNOSIS — Z800 Family history of malignant neoplasm of digestive organs: Secondary | ICD-10-CM

## 2023-06-21 DIAGNOSIS — D496 Neoplasm of unspecified behavior of brain: Secondary | ICD-10-CM

## 2023-06-21 DIAGNOSIS — C7931 Secondary malignant neoplasm of brain: Principal | ICD-10-CM | POA: Diagnosis present

## 2023-06-21 DIAGNOSIS — Z8249 Family history of ischemic heart disease and other diseases of the circulatory system: Secondary | ICD-10-CM

## 2023-06-21 DIAGNOSIS — E039 Hypothyroidism, unspecified: Secondary | ICD-10-CM | POA: Diagnosis present

## 2023-06-21 DIAGNOSIS — E785 Hyperlipidemia, unspecified: Secondary | ICD-10-CM | POA: Diagnosis present

## 2023-06-21 DIAGNOSIS — Z8711 Personal history of peptic ulcer disease: Secondary | ICD-10-CM

## 2023-06-21 DIAGNOSIS — Z87891 Personal history of nicotine dependence: Secondary | ICD-10-CM

## 2023-06-21 LAB — CBC WITH DIFFERENTIAL/PLATELET
Abs Immature Granulocytes: 0.03 10*3/uL (ref 0.00–0.07)
Basophils Absolute: 0 10*3/uL (ref 0.0–0.1)
Basophils Relative: 0 %
Eosinophils Absolute: 0 10*3/uL (ref 0.0–0.5)
Eosinophils Relative: 0 %
HCT: 29.9 % — ABNORMAL LOW (ref 36.0–46.0)
Hemoglobin: 9.9 g/dL — ABNORMAL LOW (ref 12.0–15.0)
Immature Granulocytes: 0 %
Lymphocytes Relative: 15 %
Lymphs Abs: 1.2 10*3/uL (ref 0.7–4.0)
MCH: 32.2 pg (ref 26.0–34.0)
MCHC: 33.1 g/dL (ref 30.0–36.0)
MCV: 97.4 fL (ref 80.0–100.0)
Monocytes Absolute: 0.1 10*3/uL (ref 0.1–1.0)
Monocytes Relative: 1 %
Neutro Abs: 6.4 10*3/uL (ref 1.7–7.7)
Neutrophils Relative %: 84 %
Platelets: 349 10*3/uL (ref 150–400)
RBC: 3.07 MIL/uL — ABNORMAL LOW (ref 3.87–5.11)
RDW: 12.7 % (ref 11.5–15.5)
WBC: 7.7 10*3/uL (ref 4.0–10.5)
nRBC: 0 % (ref 0.0–0.2)

## 2023-06-21 LAB — COMPREHENSIVE METABOLIC PANEL
ALT: 27 U/L (ref 0–44)
AST: 23 U/L (ref 15–41)
Albumin: 2.9 g/dL — ABNORMAL LOW (ref 3.5–5.0)
Alkaline Phosphatase: 71 U/L (ref 38–126)
Anion gap: 10 (ref 5–15)
BUN: 19 mg/dL (ref 8–23)
CO2: 26 mmol/L (ref 22–32)
Calcium: 9.4 mg/dL (ref 8.9–10.3)
Chloride: 100 mmol/L (ref 98–111)
Creatinine, Ser: 1.03 mg/dL — ABNORMAL HIGH (ref 0.44–1.00)
GFR, Estimated: 57 mL/min — ABNORMAL LOW (ref >60)
Glucose, Bld: 136 mg/dL — ABNORMAL HIGH (ref 70–99)
Potassium: 4.5 mmol/L (ref 3.5–5.1)
Sodium: 136 mmol/L (ref 135–145)
Total Bilirubin: 0.6 mg/dL (ref 0.3–1.2)
Total Protein: 7.8 g/dL (ref 6.5–8.1)

## 2023-06-21 LAB — MAGNESIUM: Magnesium: 1.8 mg/dL (ref 1.7–2.4)

## 2023-06-21 MED ORDER — DEXAMETHASONE 4 MG PO TABS
4.0000 mg | ORAL_TABLET | Freq: Once | ORAL | Status: AC
  Administered 2023-06-21: 4 mg via ORAL
  Filled 2023-06-21: qty 1

## 2023-06-21 MED ORDER — LACOSAMIDE 100 MG PO TABS: 100.0000 mg | ORAL_TABLET | Freq: Two times a day (BID) | ORAL | 2 refills | Status: DC

## 2023-06-21 MED ORDER — LACOSAMIDE 50 MG PO TABS
100.0000 mg | ORAL_TABLET | Freq: Two times a day (BID) | ORAL | Status: DC
  Administered 2023-06-22 – 2023-06-26 (×10): 100 mg via ORAL
  Filled 2023-06-21 (×10): qty 2

## 2023-06-21 NOTE — H&P (Incomplete)
Date: 06/21/2023               Patient Name:  Tabitha Ramirez MRN: [id number]  DOB: [birth year] Age / Sex: 77 y.o., female   PCP: [facility]         Medical Service: Internal Medicine Teaching Service         Attending Physician: Dr. Vernia Buff, MD      First Contact: {InternPager24/25:[id number]}    Second Contact: {ResidentPager24/25:29694}         After Hours (After 5p/  First Contact Pager: [phone]  weekends / holidays): Second Contact Pager: [phone]   SUBJECTIVE   Chief Complaint: ***  History of Present Illness: [name] is a 77 year old Female with a hx of *** who was brought in by EMS from [facility] per request from her neurologist due to concerns for left sided facial twitching and left upper extremity weakness. Patient is been at the rehab and scheduled to head home today when she started having left sided weakness and facial twitching,spoke to her neurologist Dr Voncille Lo  recommended that the patient increases her Vimpat from 5mg  to 10 mg BID. Dr Sinclair Ship thought there was any indication for an ED visit  but her daughter felt the twitching is not normal and insisted they come to the ED  ED Course:   Past Medical History  Meds:  No outpatient medications have been marked as taking for the 06/21/23 encounter [facility] Encounter).      Past Surgical History:  Procedure Laterality Date  . AXILLARY LYMPH NODE BIOPSY Right 03/10/2013   Procedure: AXILLARY LYMPH NODE BIOPSY;  Surgeon: Ernestene Mention, MD;  Location: [facility] OR;  Service: General;  Laterality: Right;  sentinel node with blue dye  . BREAST BIOPSY    . BREAST LUMPECTOMY Right 09/29/2013   needle localization w/axillary LND/notes 09/29/2013  . BREAST LUMPECTOMY WITH NEEDLE LOCALIZATION AND AXILLARY LYMPH NODE DISSECTION Right 09/29/2013   Procedure: RIGHT BREAST NEEDLE LOCALIZED LUMPECTOMY AND AXILLARY LYMPH NODE DISSECTION;  Surgeon: Ernestene Mention, MD;  Location: [facility] OR;  Service:  General;  Laterality: Right;  . COLONOSCOPY N/A 04/30/2013   Procedure: COLONOSCOPY;  Surgeon: Graylin Shiver, MD;  Location: [facility] ENDOSCOPY;  Service: Endoscopy;  Laterality: N/A;  . CRANIOTOMY Right 03/11/2022   Procedure: CRANIOTOMY TUMOR EXCISION;  Surgeon: Venetia Night, MD;  Location: [facility] ORS;  Service: Neurosurgery;  Laterality: Right;  . ESOPHAGOGASTRODUODENOSCOPY N/A 04/28/2013   Procedure: ESOPHAGOGASTRODUODENOSCOPY (EGD);  Surgeon: Graylin Shiver, MD;  Location: [facility];  Service: Endoscopy;  Laterality: N/A;  . MASTECTOMY Right 09/29/2013   "partial"  . PORT-A-CATH REMOVAL  09/29/2013  . PORT-A-CATH REMOVAL Left 09/29/2013   Procedure: REMOVAL PORT-A-CATH;  Surgeon: Ernestene Mention, MD;  Location: [facility] OR;  Service: General;  Laterality: Left;  . PORTACATH PLACEMENT N/A 03/10/2013   Procedure: INSERTION PORT-A-CATH WITH FLUOROSCOPY AND ULTRASOUND;  Surgeon: Ernestene Mention, MD;  Location: [facility] OR;  Service: General;  Laterality: N/A;  . SURAL NERVE BX Right 05/13/2014   Procedure: SURAL NERVE BIOPSY;  Surgeon: Hewitt Shorts, MD;  Location: [facility] NEURO ORS;  Service: Neurosurgery;  Laterality: Right;  Right sural nerve biopsy  . TONSILLECTOMY    . TOTAL ABDOMINAL HYSTERECTOMY     With bilateral salpingo-oophorectomy    Social:  Lives With: Daughter at home  Occupation: Retired  Support: Level of Function: PCP: Substances: Production manager: Code Status:   Family History: ***  Allergies:  Allergies as of 06/21/2023 - Review Complete 06/21/2023  Allergen Reaction Noted  . Penicillins Rash 04/09/2008  . Nsaids Nausea And Vomiting and Other (See Comments) 10/22/2017    Review of Systems: A complete ROS was negative except as per HPI.   OBJECTIVE:   Physical Exam: Blood pressure 112/69, pulse 82, temperature 98.2 F (36.8 C), temperature source Oral, resp. rate (!) 22, height 5\' 3"  (1.6 m), weight 59 kg, SpO2 100%.   Constitutional: well-appearing *** sitting  in ***, in no acute distress HENT: normocephalic atraumatic, mucous membranes moist Eyes: conjunctiva non-erythematous Neck: supple Cardiovascular: regular rate and rhythm, no m/r/g Pulmonary/Chest: normal work of breathing on room air, lungs clear to auscultation bilaterally Abdominal: soft, non-tender, non-distended MSK: normal bulk and tone Neurological: 4/5 Left sided weakness, Numbness on left  face V2-V3 distribution .   alert & oriented x 3, 5/5 strength in bilateral upper and lower extremities, normal gait Skin: warm and dry Psych: ***  Labs: CBC    Component Value Date/Time   WBC 7.7 06/21/2023 2222   RBC 3.07 (L) 06/21/2023 2222   HGB 9.9 (L) 06/21/2023 2222   HGB 10.7 (L) 06/22/2014 1107   HCT 29.9 (L) 06/21/2023 2222   HCT 32.5 (L) 06/22/2014 1107   PLT 349 06/21/2023 2222   PLT 284 06/22/2014 1107   MCV 97.4 06/21/2023 2222   MCV 102.2 (H) 06/22/2014 1107   MCH 32.2 06/21/2023 2222   MCHC 33.1 06/21/2023 2222   RDW 12.7 06/21/2023 2222   RDW 14.7 (H) 06/22/2014 1107   LYMPHSABS 1.2 06/21/2023 2222   LYMPHSABS 2.4 06/22/2014 1107   MONOABS 0.1 06/21/2023 2222   MONOABS 0.7 06/22/2014 1107   EOSABS 0.0 06/21/2023 2222   EOSABS 0.2 06/22/2014 1107   BASOSABS 0.0 06/21/2023 2222   BASOSABS 0.0 06/22/2014 1107     CMP     Component Value Date/Time   NA 136 06/21/2023 2222   NA 140 03/30/2015 0944   K 4.5 06/21/2023 2222   K 3.8 03/30/2015 0944   CL 100 06/21/2023 2222   CL 100 03/13/2013 0918   CO2 26 06/21/2023 2222   CO2 26 03/30/2015 0944   GLUCOSE 136 (H) 06/21/2023 2222   GLUCOSE 103 03/30/2015 0944   GLUCOSE 169 (H) 03/13/2013 0918   BUN 19 06/21/2023 2222   BUN 18.9 03/30/2015 0944   CREATININE 1.03 (H) 06/21/2023 2222   CREATININE 0.9 03/30/2015 0944   CALCIUM 9.4 06/21/2023 2222   CALCIUM 9.7 03/30/2015 0944   PROT 7.8 06/21/2023 2222   PROT 7.9 03/30/2015 0944   ALBUMIN 2.9 (L) 06/21/2023 2222   ALBUMIN 3.1 (L) 03/30/2015 0944   AST  23 06/21/2023 2222   AST 20 03/30/2015 0944   ALT 27 06/21/2023 2222   ALT 19 03/30/2015 0944   ALKPHOS 71 06/21/2023 2222   ALKPHOS 100 03/30/2015 0944   BILITOT 0.6 06/21/2023 2222   BILITOT 0.45 03/30/2015 0944   GFRNONAA 57 (L) 06/21/2023 2222   GFRNONAA 64 11/29/2011 1535   GFRAA 85 (L) 05/12/2014 1238   GFRAA 74 11/29/2011 1535    Imaging:  EKG: personally reviewed my interpretation is***. Prior EKG***  ASSESSMENT & PLAN:   Assessment & Plan by Problem: Active Problems:   * No active hospital problems. *   LEDIA FINUCANE is a 76 y.o. person living with a history of *** who presented with *** and admitted for *** on hospital day 0  *** ***  *** ***  *** ***  Diet: {NAMES:3044014::"Normal","Heart Healthy","Carb-Modified","Renal","Carb/Renal","NPO","TPN","Tube Feeds"} VTE: {NAMES:3044014::"Heparin","Enoxaparin","SCDs","DOAC","None"} IVF: {NAMES:3044014::"None","NS","1/2 NS","LR","D5","D10"},{NAMES:3044014::"None","10cc/hr","25cc/hr","50cc/hr","75cc/hr","100cc/hr","110cc/hr","125cc/hr","Bolus"} Code: {NAMES:3044014::"Full","DNR","DNI","DNR/DNI","Comfort Care","Unknown"}  Prior to Admission Living Arrangement: {NAMES:3044014::"Home, living ***","SNF, ***","Homeless","***"} Anticipated Discharge Location: {NAMES:3044014::"Home","SNF","CIR","***"} Barriers to Discharge: ***  Dispo: Admit patient to {STATUS:3044014::"Observation with expected length of stay less than 2 midnights.","Inpatient with expected length of stay greater than 2 midnights."}  Signed: Kathleen Lime, MD Internal Medicine Resident PGY-1  06/21/2023, 11:41 PM

## 2023-06-21 NOTE — ED Notes (Signed)
[facility] attempted to ambulate the pt. They report the pt was not able to even sit on the side of the bed without falling over or holding onto them. Was able to stand her up, but she couldn't even move her right side because she puts most of her weight on that side because her left side is weak.

## 2023-06-21 NOTE — H&P (Addendum)
Date: 06/22/2023               Patient Name:  Tabitha Ramirez MRN: [id number]  DOB: Jun 07, 1946 Age / Sex: 77 y.o., female   PCP: [facility]         Medical Service: Internal Medicine Teaching Service         Attending Physician: Dr. Vernia Buff, MD      First Contact: Dr. Philomena Doheny, MD Pager [phone]    Second Contact: Dr. Gwenevere Abbot, MD Pager [phone]         After Hours (After 5p/  First Contact Pager: [phone]  weekends / holidays): Second Contact Pager: [phone]   SUBJECTIVE   Chief Complaint: Facial Twitching   History of Present Illness: Tabitha Ramirez is a 77 year old Female with a hx of Metastatic breast cancer to the brain s/p right parietal craniotomy and tumor resection on 03/11/2022 , who was brought in by EMS from [facility]  due to concerns for left sided weakness and facial twitching . Patient is been at the rehab after her last admission 3 weeks ago and scheduled to head home today when she started having left sided weakness and facial twitching,spoke to her neurologist Dr Voncille Lo  recommended that the patient increases her Vimpat from 50mg  to 100 mg BID. Dr Barbaraann Cao thought there was no indication for an ED visit  but her daughter worried that she could be having another seizure and brought the patient in.EMS reported that the patient has a seizure episode last night and has had an ongoing facial twitching since.  Of note, patient was recently discharged from the hospital with similar presentation and discharged to [facility] rehab 3 weeks ago. This is her 3rd admission to the hospital for the same problem in the last  2 months.  ED Course: Patient was brought in my EMS with vital signs of Blood pressure 112/69, pulse 82, temperature 98.2 F (36.8 C)  Past Medical History Metastatic breast cancer to the brain s/p right parietal craniotomy and tumor resection Breakthrough Seizures HLD HTN GERD Hypothyroidism   Meds:  No  outpatient medications have been marked as taking for the 06/21/23 encounter [facility] Encounter).  Keppra 1G tid Lacosamide 50mg  bid Anastrazole 1g every day Cholecalciferol 1000U every day Ferrous Sulfate 325mg  every day Synthroid every day Rosuvastatin 10mg  every day Cyanocobalamin every day Triamterene-hydrochlorothiazide 37.5-25mg  every day VIMPAT 50 mg BID.   Past Surgical History:  Procedure Laterality Date   AXILLARY LYMPH NODE BIOPSY Right 03/10/2013   Procedure: AXILLARY LYMPH NODE BIOPSY;  Surgeon: Ernestene Mention, MD;  Location: [facility] OR;  Service: General;  Laterality: Right;  sentinel node with blue dye   BREAST BIOPSY     BREAST LUMPECTOMY Right 09/29/2013   needle localization w/axillary LND/notes 09/29/2013   BREAST LUMPECTOMY WITH NEEDLE LOCALIZATION AND AXILLARY LYMPH NODE DISSECTION Right 09/29/2013   Procedure: RIGHT BREAST NEEDLE LOCALIZED LUMPECTOMY AND AXILLARY LYMPH NODE DISSECTION;  Surgeon: Ernestene Mention, MD;  Location: [facility] OR;  Service: General;  Laterality: Right;   COLONOSCOPY N/A 04/30/2013   Procedure: COLONOSCOPY;  Surgeon: Graylin Shiver, MD;  Location: [facility] ENDOSCOPY;  Service: Endoscopy;  Laterality: N/A;   CRANIOTOMY Right 03/11/2022   Procedure: CRANIOTOMY TUMOR EXCISION;  Surgeon: Venetia Night, MD;  Location: [facility] ORS;  Service: Neurosurgery;  Laterality: Right;   ESOPHAGOGASTRODUODENOSCOPY N/A 04/28/2013   Procedure: ESOPHAGOGASTRODUODENOSCOPY (EGD);  Surgeon: Graylin Shiver, MD;  Location: [facility] ENDOSCOPY;  Service: Endoscopy;  Laterality: N/A;   MASTECTOMY Right 09/29/2013   "partial"   PORT-A-CATH REMOVAL  09/29/2013   PORT-A-CATH REMOVAL Left 09/29/2013   Procedure: REMOVAL PORT-A-CATH;  Surgeon: Ernestene Mention, MD;  Location: [facility] OR;  Service: General;  Laterality: Left;   PORTACATH PLACEMENT N/A 03/10/2013   Procedure: INSERTION PORT-A-CATH WITH FLUOROSCOPY AND ULTRASOUND;  Surgeon: Ernestene Mention, MD;  Location: [facility] OR;  Service: General;   Laterality: N/A;   SURAL NERVE BX Right 05/13/2014   Procedure: SURAL NERVE BIOPSY;  Surgeon: Hewitt Shorts, MD;  Location: [facility] NEURO ORS;  Service: Neurosurgery;  Laterality: Right;  Right sural nerve biopsy   TONSILLECTOMY     TOTAL ABDOMINAL HYSTERECTOMY     With bilateral salpingo-oophorectomy    Social:  Lives With: Daughter at home  Occupation: Retired Comptroller  Support: Social security  Level of Function: Confined to a wheel chair,able to do use the bathroom with help but she can take her medications without assistance PCP: [place], [place] [facility] Substances: None reported Production manager: Daughter [phone]. Code Status: She is following up with palliative but there is no documentation showing they have reviewed her code status due to her disease progression.We reviewed medical recommendation for DNR with her due to her prognosis. She would like to talk to her daughter in the morning and get back to [place] on that.    Family History: None reported   Allergies: Allergies as of 06/21/2023 - Review Complete 06/21/2023  Allergen Reaction Noted   Penicillins Rash 04/09/2008   Nsaids Nausea And Vomiting and Other (See Comments) 10/22/2017    Review of Systems: A complete ROS was negative except as per HPI.   OBJECTIVE:   Physical Exam: Blood pressure 112/69, pulse 82, temperature 98.2 F (36.8 C), temperature source Oral, resp. rate (!) 22, height 5\' 3"  (1.6 m), weight 59 kg, SpO2 100%.   Constitutional: Well appearing woman, laying in bed ,in no acute distress ,answering questions appropriately,speaking with a hoarse voice. Cardiovascular: regular rate and rhythm, no m/r/g Pulmonary/Chest: normal work of breathing on room air, lungs clear to auscultation bilaterally Abdominal: soft, non-tender, non-distended MSK: normal bulk and tone Neurological: 4/5 Left sided weakness, Numbness on left  face V2-V3 distribution.Left facial twitching. Skin: warm and  dry  Labs: CBC    Component Value Date/Time   WBC 7.7 06/21/2023 2222   RBC 3.07 (L) 06/21/2023 2222   HGB 9.9 (L) 06/21/2023 2222   HGB 10.7 (L) 06/22/2014 1107   HCT 29.9 (L) 06/21/2023 2222   HCT 32.5 (L) 06/22/2014 1107   PLT 349 06/21/2023 2222   PLT 284 06/22/2014 1107   MCV 97.4 06/21/2023 2222   MCV 102.2 (H) 06/22/2014 1107   MCH 32.2 06/21/2023 2222   MCHC 33.1 06/21/2023 2222   RDW 12.7 06/21/2023 2222   RDW 14.7 (H) 06/22/2014 1107   LYMPHSABS 1.2 06/21/2023 2222   LYMPHSABS 2.4 06/22/2014 1107   MONOABS 0.1 06/21/2023 2222   MONOABS 0.7 06/22/2014 1107   EOSABS 0.0 06/21/2023 2222   EOSABS 0.2 06/22/2014 1107   BASOSABS 0.0 06/21/2023 2222   BASOSABS 0.0 06/22/2014 1107     CMP     Component Value Date/Time   NA 136 06/21/2023 2222   NA 140 03/30/2015 0944   K 4.5 06/21/2023 2222   K 3.8 03/30/2015 0944   CL 100 06/21/2023 2222   CL 100 03/13/2013 0918   CO2 26 06/21/2023 2222   CO2 26  03/30/2015 0944   GLUCOSE 136 (H) 06/21/2023 2222   GLUCOSE 103 03/30/2015 0944   GLUCOSE 169 (H) 03/13/2013 0918   BUN 19 06/21/2023 2222   BUN 18.9 03/30/2015 0944   CREATININE 1.03 (H) 06/21/2023 2222   CREATININE 0.9 03/30/2015 0944   CALCIUM 9.4 06/21/2023 2222   CALCIUM 9.7 03/30/2015 0944   PROT 7.8 06/21/2023 2222   PROT 7.9 03/30/2015 0944   ALBUMIN 2.9 (L) 06/21/2023 2222   ALBUMIN 3.1 (L) 03/30/2015 0944   AST 23 06/21/2023 2222   AST 20 03/30/2015 0944   ALT 27 06/21/2023 2222   ALT 19 03/30/2015 0944   ALKPHOS 71 06/21/2023 2222   ALKPHOS 100 03/30/2015 0944   BILITOT 0.6 06/21/2023 2222   BILITOT 0.45 03/30/2015 0944   GFRNONAA 57 (L) 06/21/2023 2222   GFRNONAA 64 11/29/2011 1535   GFRAA 85 (L) 05/12/2014 1238   GFRAA 74 11/29/2011 1535    Imaging:  CT HEAD  IMPRESSION: 1. Right posterior parietal craniotomy defect with a right posterior parietal lobe resection cavity. 2. Isodense area along the right posterior parietal  dissection cavity which likely corresponds to findings seen within this region on the most recent MR head ( June 20, 2023) and is concerning for the presence of recurrent tumor. 3. Generalized cerebral atrophy with microvascular disease changes of the supratentorial brain.  MRI BRAIN 06/20/2023 IMPRESSION New from the prior brain MRI of 04/04/2023, there is recurrent enhancing tumor along significant portions of the right parietal lobe (partially abutting the right parietal lobe resection cavity and involving the overlying dura). This measures 5.9 x 6.8 x 2.1 cm. Enhancement elsewhere along the right parietal lobe resection cavity has also increased in conspicuity. Most notably, there are now two nodular foci of enhancement at the anterior aspect of the resection cavity measuring up to 7 mm. This is also suspicious for recurrent tumor. Extensive surrounding T2 FLAIR hyperintense signal abnormality within the posterior right cerebral hemisphere has increased and likely reflects a combination of edema and post-treatment changes. No midline shift.  ASSESSMENT & PLAN:   Assessment & Plan by Problem: Principal Problem:   Tonic-clonic seizure (HCC)   Tabitha Ramirez is a 77 y.o. person living with a history of Metastatic breast cancer to the brain s/p right parietal craniotomy and tumor resection  who presented with Facial twitching and left sided weakness  and admitted for further workup due to concerns for facial motor seizure.  #Facial twitching #Concern for Facial motor Seizures #Metastatic breast cancer to the brain s/p right parietal craniotomy and tumor resection Patient has a hx of Metastatic breast cancer to the brain s/p right parietal craniotomy and tumor resection .Presented for facial twitching and left sided weakness after having a full body seizure . Had similar presentation, admitted and discharged on 8/30 to a rehab. Neurology placed her on VIMPAT 50 mg BID.Her facial  twitching could be from her seizures.MRI on 9/25 showed recurrent enhancing tumor along significant portions of the right parietal lobe that  measures 5.9 x 6.8 x 2.1 cm suspicious of recurrent tumor.This is her 3rd admission to the hospital for the same problem in the last  2 months and I wonder if the progression of her disease needs to be addressed at this visit for goals of care conversation.  - Neurology consults appreciated - Long term monitoring  EEG  - Restart Anastrozole - Restart Decadron 4 mg  - Restart Keppra 1000 mg TID - Increase Vimpat to 100 mg  BID per Dr Barbaraann Cao - Follow up with Dr Barbaraann Cao in the morning    Chronic Conditions  #HLD-Restart home Crestor 10 mg  #HTN - Restart home Triamterene- HTCZ  #Hypothyroidism - Restart home Levothyroxine 100 mcg  #Anemia- Restart home iron sulfate tablet 325 mg  Diet: Normal VTE: Heparin IVF: None,None Code: Full  Prior to Admission Living Arrangement: SNF, [facility] Anticipated Discharge Location: Home Barriers to Discharge: Medical work up  Dispo: Admit patient to Observation with expected length of stay less than 2 midnights.  Signed: Kathleen Lime, MD Internal Medicine Resident PGY-1  06/22/2023, 12:51 AM

## 2023-06-21 NOTE — ED Provider Notes (Signed)
[facility] AT [facility] Provider Note   CSN: [id number] Arrival date & time: 06/21/23  1239     History  Chief Complaint  Patient presents with   Seizures    Tabitha Ramirez is a 77 y.o. female.  77 yo F with a chief complaints of left-sided facial twitching and left upper extremity weakness.  Patient has a history of metastasis to the brain and has had some residual left-sided weakness but had gotten better and then got worse over the past day.  She had had a tonic-clonic seizure and was having ongoing facial twitching today.  Per EMS family had called neurology and they recommend she come to the hospital for observation.  She is just finishing today 3 weeks of rehab.   Seizures      Home Medications Prior to Admission medications   Medication Sig Start Date End Date Taking? Authorizing Provider  acetaminophen (TYLENOL) 325 MG tablet Take 1-2 tablets (325-650 mg total) by mouth every 6 (six) hours as needed for mild pain. 02/13/23   Elgergawy, Leana Roe, MD  anastrozole (ARIMIDEX) 1 MG tablet TAKE 1 TABLET(1 MG) BY MOUTH DAILY 04/26/23   Serena Croissant, MD  Cholecalciferol (D3-1000) 25 MCG (1000 UT) capsule Take 1 capsule (1,000 Units total) by mouth daily. 03/27/22   Angiulli, Mcarthur Rossetti, PA-C  ferrous sulfate 325 (65 FE) MG tablet Take 325 mg by mouth daily with breakfast.    [provider]  Lacosamide (VIMPAT) 100 MG TABS Take 1 tablet (100 mg total) by mouth 2 (two) times daily. 06/21/23   Henreitta Leber, MD  levETIRAcetam (KEPPRA) 1000 MG tablet Take 1,000 mg by mouth in the morning, at noon, and at bedtime.    [provider]  levothyroxine (SYNTHROID) 100 MCG tablet Take 100 mcg by mouth daily before breakfast.    [provider]  LORazepam (ATIVAN) 0.5 MG tablet Take 1 tablet (0.5 mg total) by mouth daily as needed for seizure. 05/25/23   Rocky Morel, DO  oxyCODONE-acetaminophen (PERCOCET/ROXICET) 5-325 MG tablet Take  1 tablet by mouth every 6 (six) hours as needed for severe pain. 05/25/23   Rocky Morel, DO  potassium chloride (KLOR-CON) 10 MEQ tablet Take 10 mEq by mouth daily. 03/10/22   [provider]  rosuvastatin (CRESTOR) 10 MG tablet Take 1 tablet (10 mg total) by mouth daily. 04/26/22   Jones Bales, NP  triamterene-hydrochlorothiazide (MAXZIDE-25) 37.5-25 MG tablet Take 1 tablet by mouth daily. 03/24/23   [provider]  vitamin B-12 (CYANOCOBALAMIN) 500 MCG tablet Take 500 mcg by mouth daily.    [provider]      Allergies    Penicillins and Nsaids    Review of Systems   Review of Systems  Neurological:  Positive for seizures.    Physical Exam Updated Vital Signs BP 106/62   Pulse 87   Temp 98 F (36.7 C) (Oral)   Resp (!) 24   Ht 5\' 3"  (1.6 m)   Wt 59 kg   SpO2 94%   BMI 23.03 kg/m  Physical Exam Vitals and nursing note reviewed.  Constitutional:      General: She is not in acute distress.    Appearance: She is well-developed. She is not diaphoretic.  HENT:     Head: Normocephalic and atraumatic.  Eyes:     Pupils: Pupils are equal, round, and reactive to light.  Cardiovascular:     Rate and Rhythm: Normal rate  and regular rhythm.     Heart sounds: No murmur heard.    No friction rub. No gallop.  Pulmonary:     Effort: Pulmonary effort is normal.     Breath sounds: No wheezing or rales.  Abdominal:     General: There is no distension.     Palpations: Abdomen is soft.     Tenderness: There is no abdominal tenderness.  Musculoskeletal:        General: No tenderness.     Cervical back: Normal range of motion and neck supple.  Skin:    General: Skin is warm and dry.  Neurological:     Mental Status: She is alert and oriented to person, place, and time.     Comments: Weakness to the left side arm greater than leg.  Left-sided facial twitching.  Psychiatric:        Behavior: Behavior normal.     ED Results / Procedures /  Treatments   Labs (all labs ordered are listed, but only abnormal results are displayed) Labs Reviewed  CBC WITH DIFFERENTIAL/PLATELET  COMPREHENSIVE METABOLIC PANEL  MAGNESIUM    EKG EKG Interpretation Date/Time:  Thursday June 21 2023 12:57:34 EDT Ventricular Rate:  83 PR Interval:  193 QRS Duration:  138 QT Interval:  391 QTC Calculation: 460 R Axis:   -8  Text Interpretation: Sinus rhythm Right bundle branch block No significant change since last tracing Confirmed by Melene Plan [phone]) on 06/21/2023 1:00:15 PM  Radiology MR Brain W Wo Contrast  Result Date: 06/21/2023 CLINICAL DATA:  Provided history: Metastasis to brain. Brain metastases, assess treatment response. EXAM: MRI HEAD WITHOUT AND WITH CONTRAST TECHNIQUE: Multiplanar, multiecho pulse sequences of the brain and surrounding structures were obtained without and with intravenous contrast. CONTRAST:  7 mL Vueway intravenous contrast. COMPARISON:  Head CT 05/21/2023. Prior brain MRI examinations 04/04/2023 and earlier. FINDINGS: Brain: Postoperative sequelae from prior right parietal craniotomy resection. New from the brain MRI of 04/04/2023, there is recurrent enhancing tumor along significant portions of the right parietal lobe, partially abutting the right parietal lobe resection cavity and involving the overlying dura. This measures 5.9 x 6.8 x 2.1 cm. Enhancement elsewhere along the right parietal lobe resection cavity has also increased in conspicuity. Most notably, there are now two nodular foci of enhancement at the anterior aspect of the resection cavity measuring up to 7 mm (for instance as seen on series 14, images 104-106). Extensive surrounding T2 FLAIR hyperintense signal abnormality within the posterior right cerebral hemisphere has progressed, and likely reflects a combination of edema and post-treatment changes. No new sites of intracranial metastatic disease elsewhere. Multifocal T2 FLAIR hyperintense signal  abnormality elsewhere within the cerebral white matter and pons, overall moderate in severity. Findings are nonspecific, but most often secondary to chronic small vessel ischemia. Chronic hemosiderin deposition again demonstrated along the margins of the right parietal lobe resection cavity and along the adjacent right parietal lobe. There is no acute infarct. No extra-axial fluid collection. No midline shift. Vascular: Maintained flow voids within the proximal large arterial vessels. Skull and upper cervical spine: Right parietal cranioplasty. No focal suspicious marrow lesion. Incompletely assessed cervical spondylosis. Sinuses/Orbits: No mass or acute finding within the imaged orbits. Small fluid level within the right maxillary sinus. Impression #1 will be called to the ordering clinician or representative by the Radiologist Assistant, and communication documented in the PACS or [employer]. IMPRESSION: 1. New from the prior brain MRI of 04/04/2023, there is recurrent enhancing tumor  along significant portions of the right parietal lobe (partially abutting the right parietal lobe resection cavity and involving the overlying dura). This measures 5.9 x 6.8 x 2.1 cm. Enhancement elsewhere along the right parietal lobe resection cavity has also increased in conspicuity. Most notably, there are now two nodular foci of enhancement at the anterior aspect of the resection cavity measuring up to 7 mm. This is also suspicious for recurrent tumor. Extensive surrounding T2 FLAIR hyperintense signal abnormality within the posterior right cerebral hemisphere has increased and likely reflects a combination of edema and post-treatment changes. No midline shift. 2. No new sites of intracranial metastatic disease elsewhere. Electronically Signed   By: Jackey Loge D.O.   On: 06/21/2023 14:31    Procedures Procedures    Medications Ordered in ED Medications  dexamethasone (DECADRON) tablet 4 mg (4 mg Oral Given  06/21/23 1345)    ED Course/ Medical Decision Making/ A&P                                 Medical Decision Making Amount and/or Complexity of Data Reviewed Labs: ordered.  Risk Prescription drug management.   77 yo F with a chief complaints of left-sided weakness and a seizure yesterday and ongoing left-sided facial twitching.  Patient arrived via EMS from her rehab facility.  On my record review it looks like the patient was was to be discharged from rehab today and there is a telephone note from Dr. Liana Gerold office suggesting that they go home and increase their antiepileptic medication.  EMS stated that the family was told to come here to be admitted.  Will try to clarify with the family and then discuss with neuro-oncology.  [name] has arrived and tells me that she was mostly worried because her symptoms did not improved.  It sounds like she typically has left-sided weakness after a seizure and that it has not improved since the seizure last night and she thinks is getting worse.  She talked with one of the nurses there thought if it was not improving that they will come to the emergency department for evaluation.  She thinks that her mom got another dose of Vimpat this morning.  I discussed case with Dr. Barbaraann Cao, neuro-oncology who recommended Decadron 4 mg twice daily and Vimpat 100 mg twice daily.  He felt this should improve her symptoms enough that she could go home.  I discussed this with the family they are still worried that she will not be able to ambulate independently.  I discussed with the ED pharmacist is recommending a 2-hour period from ingestion to see some possible benefit.  Unable to ambulate without time likely will require admission.  Patient care was signed out to Dr. Dalene Seltzer, please see their note for further details care in the ED.  3:16 PM:  I have discussed the diagnosis/risks/treatment options with the patient.  Evaluation and diagnostic testing in the  emergency department does not suggest an emergent condition requiring admission or immediate intervention beyond what has been performed at this time.  They will follow up with PCP. We also discussed returning to the ED immediately if new or worsening sx occur. We discussed the sx which are most concerning (e.g., sudden worsening pain, fever, inability to tolerate by mouth) that necessitate immediate return. Medications administered to the patient during their visit and any new prescriptions provided to the patient are listed below.  Medications given during  this visit Medications  dexamethasone (DECADRON) tablet 4 mg (4 mg Oral Given 06/21/23 1345)     The patient appears reasonably screen and/or stabilized for discharge and I doubt any other medical condition or other [facility] requiring further screening, evaluation, or treatment in the ED at this time prior to discharge.          Final Clinical Impression(s) / ED Diagnoses Final diagnoses:  None    Rx / DC Orders ED Discharge Orders     None         Melene Plan, DO 06/21/23 1516

## 2023-06-21 NOTE — ED Notes (Signed)
Pt's daughter wanted to leave her house phone number incase unable to reach her on her cell phone. Home number is [phone].

## 2023-06-21 NOTE — ED Provider Notes (Signed)
  Physical Exam  BP 106/62   Pulse 87   Temp 98 F (36.7 C) (Oral)   Resp (!) 24   Ht 5\' 3"  (1.6 m)   Wt 59 kg   SpO2 94%   BMI 23.03 kg/m   Physical Exam  Procedures  Procedures  ED Course / MDM    Medical Decision Making Amount and/or Complexity of Data Reviewed Labs: ordered.  Risk Prescription drug management.   Hx of metastatic breast cancer, new lesions, had seizure  Dr. Barbaraann Cao recommended decadron 4mg  BID, vimpat 100mg  BID       Alvira Monday, MD 06/23/23 1425

## 2023-06-21 NOTE — Progress Notes (Signed)
[facility] consult received to obtain IV access d/t patient's admission. Currently no IV meds/fluids/studies ordered.  PICC RN previously to bedside to assess left arm with ultrasound; no appropriate vessels visualized for cannulation. ER RN notified per note in IV flowsheets. Right arm restricted.  Instructed ER RN to contact physician as central line will need to be placed if vascular access is needed.

## 2023-06-21 NOTE — Telephone Encounter (Signed)
Returned PC to patient's daughter, [name] - she called earlier stating patient has been at [facility] x 3 weeks, is being DC'd today.  Patient has had a seizure once for the past two days, is having "facial spasms", and is "a little weak" on her left side.  She is asking if patient should go to ED to be evaluated, she has appointment with Dr Barbaraann Cao on 06/25/23.  Per Dr Barbaraann Cao, patient is to increase her Vimpat to 100 mg twice a day, is currently taking 50 mg twice a day. He does not recommend patient going to ED at this time.  [name] informed of Dr Liana Gerold recommendation, she verbalizes understanding.

## 2023-06-21 NOTE — Hospital Course (Addendum)
Hospital Course  Tabitha Ramirez is a 77 y.o. 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 Myer Haff at [facility]), HLD, HTN, GERD, and hypothyroidism, who presented to the ED on 9/26 with facial twitching and left sided weakness. She was admitted to the Internal Medicine Teaching Service on 9/27 for further neurology workup due to concerns for facial motor seizure.   Patient was recently hospitalized at [facility] on 05/21/2023 for breakthrough seizures, discharged to [facility] where she has been for past few weeks.    #Hx of Breast cancer with metastasis to brain s/p resection #Left sided facial twitching  On admission, patient presented with facial twitching and left sided weakness after having a full body seizure at the rehab center. Dr. Barbaraann Cao, her onco-neurologist, recommended increasing VIMPAT to 100 mg BID to help manage twitching, but daughter concerned for ongoing seizures and decided to come in to ED for further evaluation as she did not feel safe taking her mother home.    Patient was brought in my EMS with a BP of 112/69, pulse pf 82, and temperature 98.2 F (36.8 C). On physical exam patient was noted to speak with a hoarse voice, have 4/5 left sided weakness, numbness on left face along the V2-V3 distribution, and left-sided twitching. Neurology was consulted in the ED.  MRI on admission showed recurrent enhancing tumor along significant portions of the right parietal lobe measuring 5.9 x 6.8 x 2.1 cm concerning for recurrent tumor in resection site. 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 One seizure without clinical signs was noted on 06/22/2023 at 1421 arising from the right parietal region lasting about 3  minutes.  Patient was started on an increased dose of Vimpat 100 mg BID, per Dr. Liana Gerold recommendation, Anastrozole/Arimidex 1 mg daily, valproate/Decadron 4 mg q12h, levetiracetam/Keppra 1000 mg TID, and oxy 5 mg q12h PRN for breakthrough pain. On hospitalization day 1 patient denied any acute pain or headache and only complained of left sided shaking/twitching. Patient was evaluated by OT and PT who recommended SNF placement at discharge.    [name], Tabitha Ramirez, has been involved in medical decision-making. Decision made by team to consult palliative care team to further discuss goals of care given recurrent hospitalizations for breakthrough seizures and new left-sided twitching in the setting of tumor recurrence. Discussion with Dr. Barbaraann Cao on 9/27 with plan to review imaging results and tumor board recommendations for next steps with patient during outpatient visit scheduled for 9/30.   Per neurology, EEG showed subclinical seizure 1x 06/22/23 at 1421 lasting about 3 minutes. Otherwise patient's EEG showing evidence of epileptogenecity and cortical dysfunction arising from right hemisphere, maximal right parietal region with increased risk of seizures. There was mild diffuse encephalopathy. No other new seizures noted. Neurology did not indicate a need to adjust patient's seizure medication regimen.   Cause of seizure is likely recurrence of the brain mass. Dr. Barbaraann Cao wants to follow up outpatient after he discusses case on tumor board and with radiology team. Visit may need to be virtual if patient still here awaiting SNF placement. Pt will benefit from continued palliative care conversations while here.  - Continue current measures including Vimpat 100 mg BID, Valproate, Decadron 4 mg BID, Keppra 1000 mg TID - Continue anastrazole 1 mg daily - Will plan  on discussing goals of care with family and palliative care team    #Right breast pain Patient with history of partial right mastectomy  (2015), right breast lumpectomy (2015). Per chart review, patient has had 10 fractions to her right breast. Has history of occasional sharp pains in the right breast, has taken 2 norco per day for pain in the past. Currently endorsing intermittent pain, similar to what she has experienced before, believes PRN oxy works to relieve pain, will let team know if pain continues or needs additional pain management.  Plan:  Oxy 5 mg q12H PRN   #HLD Chronic. Restarted home rosuvastatin 10 mg daily.    #HTN Chronic. Patient with intermittent low blood pressures, otherwise remained HDS with systolic pressures 100s-120s/ diastolic pressures 50s-70s. Restarted home triamterene- HTCZ 37.5 mg-25 mg daily   #Hypothyroidism  Chronic. Restarted home levothyroxine 100 mcg daily   #Anemia Chronic. Hgb on admission 9.9, improved to 11.6 on hospitalization day 1. Hgb baseline 9-10. Restarted home iron sulfate tablet 325 mg.     Home med list Keppra 1G tid Lacosamide 50mg  bid Anastrazole 1g every day Cholecalciferol 1000U every day Ferrous Sulfate 325mg  every day Synthroid every day Rosuvastatin 10mg  every day Cyanocobalamin every day Triamterene-hydrochlorothiazide 37.5-25mg  every day

## 2023-06-21 NOTE — ED Triage Notes (Signed)
Per EMS and pt report, pt had a [place] seizure last night around 8pm. Pt was diagnosed with a brain tumor last year and had surgery to remove it. Were unable to remove all of it. Pt has had occasional seizures and left sided weakness since diagnosis. Pt was sent to imaging last night for MRI, but haven't been told any results. EMS was called to send her to ED for observation. Pt's daughter told EMS that the left sided weakness is worse after the seizure last night.

## 2023-06-22 ENCOUNTER — Observation Stay (HOSPITAL_COMMUNITY): Payer: Medicare PPO

## 2023-06-22 DIAGNOSIS — I10 Essential (primary) hypertension: Secondary | ICD-10-CM | POA: Diagnosis present

## 2023-06-22 DIAGNOSIS — Z515 Encounter for palliative care: Secondary | ICD-10-CM

## 2023-06-22 DIAGNOSIS — E785 Hyperlipidemia, unspecified: Secondary | ICD-10-CM | POA: Diagnosis present

## 2023-06-22 DIAGNOSIS — Z23 Encounter for immunization: Secondary | ICD-10-CM | POA: Diagnosis not present

## 2023-06-22 DIAGNOSIS — G514 Facial myokymia: Secondary | ICD-10-CM | POA: Insufficient documentation

## 2023-06-22 DIAGNOSIS — N644 Mastodynia: Secondary | ICD-10-CM | POA: Diagnosis present

## 2023-06-22 DIAGNOSIS — R569 Unspecified convulsions: Secondary | ICD-10-CM

## 2023-06-22 DIAGNOSIS — Z800 Family history of malignant neoplasm of digestive organs: Secondary | ICD-10-CM | POA: Diagnosis not present

## 2023-06-22 DIAGNOSIS — Z9221 Personal history of antineoplastic chemotherapy: Secondary | ICD-10-CM | POA: Diagnosis not present

## 2023-06-22 DIAGNOSIS — G40409 Other generalized epilepsy and epileptic syndromes, not intractable, without status epilepticus: Secondary | ICD-10-CM | POA: Diagnosis not present

## 2023-06-22 DIAGNOSIS — R7989 Other specified abnormal findings of blood chemistry: Secondary | ICD-10-CM | POA: Diagnosis present

## 2023-06-22 DIAGNOSIS — Z87891 Personal history of nicotine dependence: Secondary | ICD-10-CM | POA: Diagnosis not present

## 2023-06-22 DIAGNOSIS — Z79890 Hormone replacement therapy (postmenopausal): Secondary | ICD-10-CM | POA: Diagnosis not present

## 2023-06-22 DIAGNOSIS — Z7901 Long term (current) use of anticoagulants: Secondary | ICD-10-CM | POA: Diagnosis not present

## 2023-06-22 DIAGNOSIS — G40109 Localization-related (focal) (partial) symptomatic epilepsy and epileptic syndromes with simple partial seizures, not intractable, without status epilepticus: Secondary | ICD-10-CM

## 2023-06-22 DIAGNOSIS — G8194 Hemiplegia, unspecified affecting left nondominant side: Secondary | ICD-10-CM | POA: Diagnosis present

## 2023-06-22 DIAGNOSIS — Z7189 Other specified counseling: Secondary | ICD-10-CM

## 2023-06-22 DIAGNOSIS — C50919 Malignant neoplasm of unspecified site of unspecified female breast: Secondary | ICD-10-CM

## 2023-06-22 DIAGNOSIS — R29810 Facial weakness: Secondary | ICD-10-CM | POA: Diagnosis present

## 2023-06-22 DIAGNOSIS — C7931 Secondary malignant neoplasm of brain: Secondary | ICD-10-CM | POA: Diagnosis present

## 2023-06-22 DIAGNOSIS — C50911 Malignant neoplasm of unspecified site of right female breast: Secondary | ICD-10-CM | POA: Diagnosis present

## 2023-06-22 DIAGNOSIS — Z923 Personal history of irradiation: Secondary | ICD-10-CM | POA: Diagnosis not present

## 2023-06-22 DIAGNOSIS — J9811 Atelectasis: Secondary | ICD-10-CM | POA: Diagnosis present

## 2023-06-22 DIAGNOSIS — K219 Gastro-esophageal reflux disease without esophagitis: Secondary | ICD-10-CM | POA: Diagnosis present

## 2023-06-22 DIAGNOSIS — Z9011 Acquired absence of right breast and nipple: Secondary | ICD-10-CM | POA: Diagnosis not present

## 2023-06-22 DIAGNOSIS — D630 Anemia in neoplastic disease: Secondary | ICD-10-CM | POA: Diagnosis present

## 2023-06-22 DIAGNOSIS — Z79899 Other long term (current) drug therapy: Secondary | ICD-10-CM | POA: Diagnosis not present

## 2023-06-22 DIAGNOSIS — G40909 Epilepsy, unspecified, not intractable, without status epilepticus: Secondary | ICD-10-CM | POA: Diagnosis present

## 2023-06-22 DIAGNOSIS — Z66 Do not resuscitate: Secondary | ICD-10-CM | POA: Diagnosis present

## 2023-06-22 DIAGNOSIS — E039 Hypothyroidism, unspecified: Secondary | ICD-10-CM | POA: Diagnosis present

## 2023-06-22 DIAGNOSIS — Z79811 Long term (current) use of aromatase inhibitors: Secondary | ICD-10-CM | POA: Diagnosis not present

## 2023-06-22 LAB — COMPREHENSIVE METABOLIC PANEL
ALT: 27 U/L (ref 0–44)
AST: 24 U/L (ref 15–41)
Albumin: 3.3 g/dL — ABNORMAL LOW (ref 3.5–5.0)
Alkaline Phosphatase: 73 U/L (ref 38–126)
Anion gap: 18 — ABNORMAL HIGH (ref 5–15)
BUN: 23 mg/dL (ref 8–23)
CO2: 23 mmol/L (ref 22–32)
Calcium: 10 mg/dL (ref 8.9–10.3)
Chloride: 98 mmol/L (ref 98–111)
Creatinine, Ser: 1.01 mg/dL — ABNORMAL HIGH (ref 0.44–1.00)
GFR, Estimated: 58 mL/min — ABNORMAL LOW (ref >60)
Glucose, Bld: 104 mg/dL — ABNORMAL HIGH (ref 70–99)
Potassium: 4.2 mmol/L (ref 3.5–5.1)
Sodium: 139 mmol/L (ref 135–145)
Total Bilirubin: 0.3 mg/dL (ref 0.3–1.2)
Total Protein: 8.6 g/dL — ABNORMAL HIGH (ref 6.5–8.1)

## 2023-06-22 LAB — CBC
HCT: 35.3 % — ABNORMAL LOW (ref 36.0–46.0)
Hemoglobin: 11.6 g/dL — ABNORMAL LOW (ref 12.0–15.0)
MCH: 32.2 pg (ref 26.0–34.0)
MCHC: 32.9 g/dL (ref 30.0–36.0)
MCV: 98.1 fL (ref 80.0–100.0)
Platelets: 361 10*3/uL (ref 150–400)
RBC: 3.6 MIL/uL — ABNORMAL LOW (ref 3.87–5.11)
RDW: 12.5 % (ref 11.5–15.5)
WBC: 7.5 10*3/uL (ref 4.0–10.5)
nRBC: 0 % (ref 0.0–0.2)

## 2023-06-22 LAB — MRSA NEXT GEN BY PCR, NASAL: MRSA by PCR Next Gen: NOT DETECTED

## 2023-06-22 MED ORDER — OXYCODONE HCL 5 MG PO TABS
5.0000 mg | ORAL_TABLET | Freq: Two times a day (BID) | ORAL | Status: DC | PRN
  Administered 2023-06-23 – 2023-06-24 (×2): 5 mg via ORAL
  Filled 2023-06-22 (×2): qty 1

## 2023-06-22 MED ORDER — ANASTROZOLE 1 MG PO TABS
1.0000 mg | ORAL_TABLET | Freq: Every day | ORAL | Status: DC
  Administered 2023-06-22 – 2023-06-26 (×5): 1 mg via ORAL
  Filled 2023-06-22 (×5): qty 1

## 2023-06-22 MED ORDER — ENSURE ENLIVE PO LIQD
237.0000 mL | Freq: Two times a day (BID) | ORAL | Status: DC
  Administered 2023-06-22 – 2023-06-26 (×9): 237 mL via ORAL

## 2023-06-22 MED ORDER — ROSUVASTATIN CALCIUM 5 MG PO TABS
10.0000 mg | ORAL_TABLET | Freq: Every day | ORAL | Status: DC
  Administered 2023-06-22 – 2023-06-26 (×5): 10 mg via ORAL
  Filled 2023-06-22 (×5): qty 2

## 2023-06-22 MED ORDER — TRIAMTERENE-HCTZ 37.5-25 MG PO TABS
1.0000 | ORAL_TABLET | Freq: Every day | ORAL | Status: DC
  Administered 2023-06-22 – 2023-06-26 (×5): 1 via ORAL
  Filled 2023-06-22 (×5): qty 1

## 2023-06-22 MED ORDER — OXYCODONE HCL 5 MG PO TABS: 10.0000 mg | ORAL_TABLET | Freq: Four times a day (QID) | ORAL | Status: DC | PRN

## 2023-06-22 MED ORDER — LEVETIRACETAM 500 MG PO TABS
1000.0000 mg | ORAL_TABLET | Freq: Three times a day (TID) | ORAL | Status: DC
  Administered 2023-06-22 – 2023-06-26 (×13): 1000 mg via ORAL
  Filled 2023-06-22 (×4): qty 2
  Filled 2023-06-22: qty 4
  Filled 2023-06-22 (×8): qty 2

## 2023-06-22 MED ORDER — LEVOTHYROXINE SODIUM 100 MCG PO TABS
100.0000 ug | ORAL_TABLET | Freq: Every day | ORAL | Status: DC
  Administered 2023-06-22 – 2023-06-26 (×5): 100 ug via ORAL
  Filled 2023-06-22 (×5): qty 1

## 2023-06-22 MED ORDER — FERROUS SULFATE 325 (65 FE) MG PO TABS
325.0000 mg | ORAL_TABLET | Freq: Every day | ORAL | Status: DC
  Administered 2023-06-22 – 2023-06-26 (×5): 325 mg via ORAL
  Filled 2023-06-22 (×5): qty 1

## 2023-06-22 MED ORDER — VALPROATE SODIUM 100 MG/ML IV SOLN
1200.0000 mg | Freq: Once | INTRAVENOUS | Status: AC
  Administered 2023-06-22: 1200 mg via INTRAVENOUS
  Filled 2023-06-22: qty 12

## 2023-06-22 MED ORDER — DEXAMETHASONE 4 MG PO TABS
4.0000 mg | ORAL_TABLET | Freq: Two times a day (BID) | ORAL | Status: DC
  Administered 2023-06-22 – 2023-06-26 (×10): 4 mg via ORAL
  Filled 2023-06-22 (×10): qty 1

## 2023-06-22 MED ORDER — HEPARIN SODIUM (PORCINE) 5000 UNIT/ML IJ SOLN
5000.0000 [IU] | Freq: Three times a day (TID) | INTRAMUSCULAR | Status: DC
  Administered 2023-06-22 – 2023-06-26 (×14): 5000 [IU] via SUBCUTANEOUS
  Filled 2023-06-22 (×14): qty 1

## 2023-06-22 NOTE — Progress Notes (Signed)
LTM EEG running - no initial skin breakdown - push button tested - neuro notified.  

## 2023-06-22 NOTE — Evaluation (Signed)
Physical Therapy Evaluation Patient Details Name: Tabitha Ramirez MRN: [id number] DOB: 11/19/1945 Today's Date: 06/22/2023  History of Present Illness  Pt is a 77 y/o female admitted from SNF rehab 9/26 due to seizure activity concerns. LTM EEG ongoing. PMH: Metastatic breast cancer to the brain s/p right parietal craniotomy and tumor resection on 03/11/2022, seizures, HLD, HTN, GERD, hypothyroidism  Clinical Impression  Pt admitted with above diagnosis. Reportedly walking with RW at SNF prior to admission, nearing d/c to home day of seizure per reports. Currently requires min assist for basic bed mobility, mod assist for sit to stand, unable to initiate gait due to leftward lean, poor motor planning and control, difficulty sequencing, needs multimodal cues. Patient will benefit from continued inpatient follow up therapy, <3 hours/day.  Pt currently with functional limitations due to the deficits listed below (see PT Problem List). Pt will benefit from acute skilled PT to increase their independence and safety with mobility to allow discharge.           If plan is discharge home, recommend the following: Two people to help with walking and/or transfers;A lot of help with bathing/dressing/bathroom;Assistance with cooking/housework;Direct supervision/assist for medications management;Direct supervision/assist for financial management;Assist for transportation;Supervision due to cognitive status;Help with stairs or ramp for entrance   Can travel by private vehicle   No    Equipment Recommendations None recommended by PT  Recommendations for Other Services       Functional Status Assessment Patient has had a recent decline in their functional status and demonstrates the ability to make significant improvements in function in a reasonable and predictable amount of time.     Precautions / Restrictions Precautions Precautions: Fall Precaution Comments: seizures Restrictions Weight Bearing  Restrictions: No      Mobility  Bed Mobility Overal bed mobility: Needs Assistance Bed Mobility: Sit to Supine, Rolling, Sidelying to Sit Rolling: Min assist, Used rails Sidelying to sit: Min assist, Used rails, HOB elevated   Sit to supine: Min assist, Used rails, HOB elevated   General bed mobility comments: Min assist to roll onto Rt side, CGA rolling left (assisted with bed pad change and peri-care however she was able to wipe herself due to urinary incontinence.) Min assist for trunk support and to facilitate LEs out of bed. VC for sequencing. Min assist for LLE back into bed and to guide trunk back down.    Transfers Overall transfer level: Needs assistance Equipment used: 1 person hand held assist Transfers: Sit to/from Stand Sit to Stand: Mod assist           General transfer comment: Mod assist for boost to stand, leaning towards left. Assisted x2 with difficulty fully extending knees and standing upright. Able to shift weight to scoot along bed but did not take steps.    Ambulation/Gait                  Stairs            Wheelchair Mobility     Tilt Bed    Modified Rankin (Stroke Patients Only)       Balance Overall balance assessment: Needs assistance Sitting-balance support: Single extremity supported, Feet supported Sitting balance-Leahy Scale: Poor Sitting balance - Comments: constant hands on assist to correct balance, overall Mod A. Poor awareness, continues to scoot towards Rt nearing edge despite cues. Required physical assist to reposition. Leaning lt. Postural control: Posterior lean, Left lateral lean Standing balance support: Single extremity supported Standing balance-Leahy Scale: Poor  Standing balance comment: Hand held assist, up to mod assist due to lean Lt                             Pertinent Vitals/Pain Pain Assessment Pain Assessment: No/denies pain    Home Living Family/patient expects to be discharged  to:: Skilled nursing facility Living Arrangements: Children Available Help at Discharge: Family;Available 24 hours/day Type of Home: House Home Access: Stairs to enter Entrance Stairs-Rails: Can reach both Entrance Stairs-Number of Steps: 2   Home Layout: One level Home Equipment: Agricultural consultant (2 wheels);Cane - single point;BSC/3in1;Grab bars - tub/shower;Hand held shower head;Tub bench;Shower seat      Prior Function Prior Level of Function : Needs assist;Patient poor historian/Family not available             Mobility Comments: uses RW for mobility prior to recent hospitalization. reports walking "some" in SNF rehab ADLs Comments: Assist for LB dressing, bathing (typically opts for showers with shower chairs)     Extremity/Trunk Assessment   Upper Extremity Assessment Upper Extremity Assessment: Defer to OT evaluation LUE Deficits / Details: hx of prior CVA, flexed position LUE Coordination: decreased fine motor;decreased gross motor    Lower Extremity Assessment Lower Extremity Assessment: Difficult to assess due to impaired cognition ([place] and difficulty coordinating/understanding task. She was able to resist strong pressure when attempting to push knee into flexion while she had LE fully extended.)    Cervical / Trunk Assessment Cervical / Trunk Assessment: Normal  Communication   Communication Communication: No apparent difficulties Cueing Techniques: Verbal cues;Gestural cues;Tactile cues  Cognition Arousal: Alert Behavior During Therapy: Flat affect Overall Cognitive Status: No family/caregiver present to determine baseline cognitive functioning Area of Impairment: Awareness, Safety/judgement, Problem solving, Attention, Following commands                   Current Attention Level: Focused   Following Commands: Follows one step commands inconsistently, Follows one step commands with increased time Safety/Judgement: Decreased awareness of safety,  Decreased awareness of deficits Awareness: Emergent, Intellectual Problem Solving: Slow processing, Difficulty sequencing, Requires verbal cues General Comments: pleasant, decreased insight into deficits. poor attention to tasks and difficult to redirect with pt restless. perseverating on putting a pad and diaper on despite education typical acute care processes regarding toileting. Oriented.        General Comments General comments (skin integrity, edema, etc.): linens changed due to pt complaining of feeling wet.    Exercises     Assessment/Plan    PT Assessment Patient needs continued PT services  PT Problem List Decreased strength;Decreased range of motion;Decreased activity tolerance;Decreased balance;Decreased mobility;Decreased coordination;Decreased cognition;Decreased knowledge of use of DME;Decreased knowledge of precautions;Decreased safety awareness;Impaired tone       PT Treatment Interventions DME instruction;Gait training;Functional mobility training;Therapeutic activities;Therapeutic exercise;Balance training;Neuromuscular re-education;Cognitive remediation;Patient/family education;Wheelchair mobility training    PT Goals (Current goals can be found in the Care Plan section)  Acute Rehab PT Goals Patient Stated Goal: not stated PT Goal Formulation: With patient Time For Goal Achievement: 07/06/23 Potential to Achieve Goals: Fair    Frequency Min 1X/week     Co-evaluation               AM-PAC PT "6 Clicks" Mobility  Outcome Measure Help needed turning from your back to your side while in a flat bed without using bedrails?: A Little Help needed moving from lying on your back to sitting on the  side of a flat bed without using bedrails?: A Little Help needed moving to and from a bed to a chair (including a wheelchair)?: A Lot Help needed standing up from a chair using your arms (e.g., wheelchair or bedside chair)?: A Lot Help needed to walk in hospital  room?: Total Help needed climbing 3-5 steps with a railing? : Total 6 Click Score: 12    End of Session Equipment Utilized During Treatment: Gait belt Activity Tolerance: Patient tolerated treatment well Patient left: in bed;with call bell/phone within reach;with bed alarm set;Other (comment) (EEG staff entering room)   PT Visit Diagnosis: Unsteadiness on feet (R26.81);Muscle weakness (generalized) (M62.81);Difficulty in walking, not elsewhere classified (R26.2);Other symptoms and signs involving the nervous system (R29.898)    Time: 8469-6295 PT Time Calculation (min) (ACUTE ONLY): 17 min   Charges:   PT Evaluation $PT Eval Moderate Complexity: 1 Mod   PT General Charges $$ ACUTE PT VISIT: 1 Visit         Kathlyn Sacramento, PT, DPT [facility]  Rehabilitation Services Physical Therapist Office: [phone] Website: [email or web address]   Berton Mount 06/22/2023, 10:32 AM

## 2023-06-22 NOTE — TOC Initial Note (Signed)
Transition of Care [facility]) - Initial/Assessment Note    Patient Details  Name: Tabitha Ramirez MRN: [id number] Date of Birth: [birth year]  [facility] CM/SW Contact:    Baldemar Lenis, LCSW Phone Number: 06/22/2023, 4:59 PM  Clinical Narrative:      CSW met with patient's daughter in law, [name], at bedside to discuss recommendation for SNF. Patient has been at [facility] and was supposed to discharge home yesterday but then was admitted to the hospital. She has been ambulatory so family is in agreement to SNF but would like to go somewhere other than [facility]. CSW discussed with [name] that patient is in copay days, she will contact [employer] to check on how much the copays will be and where the patient is with her out of pocket maximum. CSW completed referral and provided [name] with CMS choice list to review and determine list of preferences. CSW to follow.             Expected Discharge Plan: Skilled Nursing Facility Barriers to Discharge: Continued Medical Work up, English as a second language teacher   Patient Goals and CMS Choice Patient states their goals for this hospitalization and ongoing recovery are:: go home soon CMS Medicare.gov Compare Post Acute Care list provided to:: Patient Choice offered to / list presented to : Patient, Adult Children [place] ownership interest in [facility].provided to:: Adult Children    Expected Discharge Plan and Services     Post Acute Care Choice: Skilled Nursing Facility Living arrangements for the past 2 months: Single Family Home                                      Prior Living Arrangements/Services Living arrangements for the past 2 months: Single Family Home Lives with:: Self Patient language and need for interpreter reviewed:: No Do you feel safe going back to the place where you live?: Yes      Need for Family Participation in Patient Care: No (Comment) Care giver support system  in place?: No (comment)   Criminal Activity/Legal Involvement Pertinent to Current Situation/Hospitalization: No - Comment as needed  Activities of Daily Living   ADL Screening (condition at time of admission) Does the patient have a NEW difficulty with bathing/dressing/toileting/self-feeding that is expected to last >3 days?: Yes (Initiates electronic notice to provider for possible OT consult) Does the patient have a NEW difficulty with getting in/out of bed, walking, or climbing stairs that is expected to last >3 days?: Yes (Initiates electronic notice to provider for possible PT consult) Does the patient have a NEW difficulty with communication that is expected to last >3 days?: No Is the patient deaf or have difficulty hearing?: No Does the patient have difficulty seeing, even when wearing glasses/contacts?: No Does the patient have difficulty concentrating, remembering, or making decisions?: Yes  Permission Sought/Granted Permission sought to share information with : Facility Medical sales representative, Family Supports Permission granted to share information with : Yes, Verbal Permission Granted  Share Information with NAME: Cristino Martes  Permission granted to share info w AGENCY: SNF  Permission granted to share info w Relationship: Daughter, Son     Emotional Assessment Appearance:: Appears stated age Attitude/Demeanor/Rapport: Engaged Affect (typically observed): Appropriate Orientation: : Oriented to Self, Oriented to Place, Oriented to  Time, Oriented to Situation Alcohol / Substance Use: Not Applicable Psych Involvement: No (comment)  Admission diagnosis:  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.409] Patient Active Problem List   Diagnosis Date Noted   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   Facial twitching 06/22/2023   COVID-19 05/25/2023   Seizures (HCC) 04/03/2023   Hypothyroidism 04/03/2023   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, right (HCC) 04/13/2022    Brain tumor (HCC) 03/17/2022   Palliative care encounter    Brain mass 03/09/2022   Seizure (HCC) 03/09/2022   Neuropathy 06/11/2014   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   Gait abnormality 01/26/2014   Unspecified hereditary and idiopathic peripheral neuropathy 01/26/2014   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   Neuropathic pain 01/20/2014   Anxiety 01/20/2014   Unspecified deficiency anemia 01/20/2014   Hx of neutropenia 08/04/2013   Jaw pain 08/04/2013   Leukocytosis 04/27/2013   Pericardial effusion 04/27/2013   Malignant neoplasm of female breast (HCC) 02/04/2013   Need for influenza vaccination 11/29/2011   HLD (hyperlipidemia) 03/07/2011   Post-menopausal 03/07/2011   Anemia 12/17/2010   Heart murmur 12/17/2010   Seasonal allergies 12/17/2010   HYPOKALEMIA 02/19/2009   Essential hypertension, benign 04/09/2008   PCP:  [facility]:   [phone] - Closed - [place], [place] - [address] [place] [address] Phone: [phone] Fax: [phone]     Social Determinants of Health (SDOH) Social History: SDOH Screenings   Food Insecurity: No Food Insecurity (06/22/2023)  Housing: Low Risk  (06/22/2023)  Transportation Needs: No Transportation Needs (06/22/2023)  Utilities: Not At Risk (06/22/2023)  Depression (PHQ2-9): Low Risk  (12/27/2022)  Tobacco Use: Medium Risk (06/21/2023)   SDOH Interventions:     Readmission Risk Interventions     No data to display

## 2023-06-22 NOTE — Progress Notes (Signed)
ATRIUM MONITORING

## 2023-06-22 NOTE — Care Plan (Addendum)
LTM eeg reviewed till 69. No seizures were noted. Please review final reports for details.   Tabitha Ramirez [name]

## 2023-06-22 NOTE — Evaluation (Signed)
Occupational Therapy Evaluation Patient Details Name: Tabitha Ramirez MRN: [id number] DOB: [birth year] Today's Date: 06/22/2023   History of Present Illness Pt is a 77 y/o female admitted from SNF rehab due to seizure activity concerns. LTM EEG ongoing. PMH: Metastatic breast cancer to the brain s/p right parietal craniotomy and tumor resection on 03/11/2022, seizures, HLD, HTN, GERD, hypothyroidism   Clinical Impression   Pt admitted from SNF rehab, reports requiring assist for ADLs and walking "some" at facility. Pt presents now with deficits in cognition, sitting balance, strength and endurance. Pt requiring cues for attention to tasks, sequencing and reorientation to situation during session. Pt required Max A for bed mobility and unable to attempt standing without +2 assist due to poor sitting balance. Pt requires Max A for UB ADL and up to Total A for LB ADLs (+2 if in standing). Recommend return to low intensity rehab setting at [facility] unless family able to provide the physical assist that pt currently requires.        If plan is discharge home, recommend the following: Two people to help with walking and/or transfers;A lot of help with walking and/or transfers;A lot of help with bathing/dressing/bathroom;Two people to help with bathing/dressing/bathroom    Functional Status Assessment  Patient has had a recent decline in their functional status and demonstrates the ability to make significant improvements in function in a reasonable and predictable amount of time.  Equipment Recommendations  Wheelchair cushion (measurements OT);Wheelchair (measurements OT);Hoyer lift    Recommendations for [employer]       Precautions / Restrictions Precautions Precautions: Fall Precaution Comments: seizures Restrictions Weight Bearing Restrictions: No      Mobility Bed Mobility Overal bed mobility: Needs Assistance Bed Mobility: Supine to Sit, Sit to Supine     Supine to sit: Max assist,  HOB elevated Sit to supine: Max assist   General bed mobility comments: Pt requiring multimodal cues to bring LE to EOB, assisting some to lift trunk but posterior bias noted. assist to scoot hips to EOB and then LE back to bed    Transfers                   General transfer comment: unable due to poor sitting balance and no +2 assist      Balance Overall balance assessment: Needs assistance Sitting-balance support: Single extremity supported, Feet supported Sitting balance-Leahy Scale: Poor Sitting balance - Comments: constant hands on assist to correct balance, overall Mod A Postural control: Posterior lean                                 ADL either performed or assessed with clinical judgement   ADL Overall ADL's : Needs assistance/impaired Eating/Feeding: Set up;Supervision/ safety;Bed level   Grooming: Moderate assistance;Sitting Grooming Details (indicate cue type and reason): able to wash face bed level but noted difficulty maintaining balance EOB. WIll need assist for bimanual tasks due to L UE weakness Upper Body Bathing: Maximal assistance;Sitting   Lower Body Bathing: Maximal assistance;+2 for physical assistance;+2 for safety/equipment;Sit to/from stand   Upper Body Dressing : Maximal assistance;Sitting   Lower Body Dressing: Total assistance;+2 for physical assistance;+2 for safety/equipment;Sitting/lateral leans;Sit to/from stand       Toileting- Architect and Hygiene: Total assistance;Bed level         General ADL Comments: Limited by impaired balance, safety awareness, L sided weakness     Vision Ability  to See in Adequate Light: 0 Adequate Patient Visual Report: No change from baseline Vision Assessment?: No apparent visual deficits     Perception         Praxis         Pertinent Vitals/Pain Pain Assessment Pain Assessment: No/denies pain     Extremity/Trunk Assessment Upper Extremity Assessment Upper  Extremity Assessment: Right hand dominant;LUE deficits/detail LUE Deficits / Details: hx of prior CVA, flexed position LUE Coordination: decreased fine motor;decreased gross motor   Lower Extremity Assessment Lower Extremity Assessment: Defer to PT evaluation   Cervical / Trunk Assessment Cervical / Trunk Assessment: Normal   Communication Communication Communication: No apparent difficulties Cueing Techniques: Verbal cues;Gestural cues;Tactile cues   Cognition Arousal: Alert Behavior During Therapy: Flat affect Overall Cognitive Status: No family/caregiver present to determine baseline cognitive functioning Area of Impairment: Awareness, Safety/judgement, Problem solving, Orientation, Attention, Following commands                 Orientation Level: Disoriented to, Situation Current Attention Level: Focused   Following Commands: Follows one step commands inconsistently, Follows one step commands with increased time Safety/Judgement: Decreased awareness of safety, Decreased awareness of deficits Awareness: Emergent, Intellectual Problem Solving: Slow processing, Difficulty sequencing, Requires verbal cues General Comments: pleasant, decreased insight into deficits. poor attention to tasks and difficult to redirect with pt restless. perseverating on putting a pad and diaper on despite education typical acute care processes regarding toileting     General Comments  Iv found in pt bed on entry; notified RN. MD team entering during OT eval    Exercises     Shoulder Instructions      Home Living Family/patient expects to be discharged to:: Private residence Living Arrangements: Children Available Help at Discharge: Family;Available 24 hours/day Type of Home: House Home Access: Stairs to enter [employer] of Steps: 2 Entrance Stairs-Rails: Can reach both Home Layout: One level     Bathroom Shower/Tub: Chief Strategy Officer: Standard Bathroom  Accessibility: Yes How Accessible: Accessible via walker Home Equipment: Rolling Walker (2 wheels);Cane - single point;BSC/3in1;Grab bars - tub/shower;Hand held shower head;Tub bench;Shower seat          Prior Functioning/Environment Prior Level of Function : Needs assist;Patient poor historian/Family not available             Mobility Comments: uses RW for mobility prior to recent hospitalization. reports walking "some" in SNF rehab ADLs Comments: Assist for LB dressing, bathing (typically opts for showers with shower chairs)        OT Problem List: Decreased strength;Decreased activity tolerance;Impaired balance (sitting and/or standing);Decreased coordination;Decreased cognition;Decreased safety awareness;Decreased knowledge of use of DME or AE;Impaired UE functional use      OT Treatment/Interventions: Self-care/ADL training;Therapeutic exercise;Energy conservation;DME and/or AE instruction;Therapeutic activities;Patient/family education    OT Goals(Current goals can be found in the care plan section) Acute Rehab OT Goals Patient Stated Goal: put a diaper on OT Goal Formulation: With patient Time For Goal Achievement: 07/06/23 Potential to Achieve Goals: Good ADL Goals Pt Will Transfer to Toilet: with min assist;stand pivot transfer;bedside commode Pt Will Perform Toileting - Clothing Manipulation and hygiene: with mod assist;sit to/from stand;sitting/lateral leans Additional ADL Goal #1: Pt to demo sitting balance > 5 min EOB with no more than CGA during functional tasks  OT Frequency: Min 1X/week    Co-evaluation              AM-PAC OT "6 Clicks" Daily Activity  Outcome Measure Help from another person eating meals?: A Little Help from another person taking care of personal grooming?: A Lot Help from another person toileting, which includes using toliet, bedpan, or urinal?: Total Help from another person bathing (including washing, rinsing, drying)?: A  Lot Help from another person to put on and taking off regular upper body clothing?: A Lot Help from another person to put on and taking off regular lower body clothing?: Total 6 Click Score: 11   End of Session Nurse Communication: Mobility status  Activity Tolerance: Patient tolerated treatment well Patient left: in bed;with call bell/phone within reach;with bed alarm set  OT Visit Diagnosis: Unsteadiness on feet (R26.81);Other abnormalities of gait and mobility (R26.89);Muscle weakness (generalized) (M62.81);Other symptoms and signs involving cognitive function                Time: 1610-9604 OT Time Calculation (min): 18 min Charges:  OT General Charges $OT Visit: 1 Visit OT Evaluation $OT Eval Moderate Complexity: 1 Mod  Bradd Canary, OTR/L Acute Rehab Services Office: [phone]   Lorre Munroe 06/22/2023, 8:08 AM

## 2023-06-22 NOTE — Progress Notes (Signed)
LTM maint completed by student- no skin breakdown under: F3

## 2023-06-22 NOTE — Consult Note (Signed)
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 female with past medical history of breast cancer metastatic to the right parietal lobe of the brain status post r resection craniotomy and radiation, hyperlipidemia, seizures on Keppra who presents with left face and LUE twitching noted at her rehab. She has some left sided weakness at baseline, residual from tumor and resection. However, noticed worsening weakness over the last day along with ongoing left facial twitching.  Case seems to have been discussed with Dr. Barbaraann Cao, her outpatient neuro-oncologist and recommended increase in Vimpat. Patient was sent to the ED. It seems like ED team reached out to Dr. Barbaraann Cao too and her home Vimpat was increased to 100mg  BID from 50mg  BID and started on Decadron 4mg  BID. She is being obsed for concern for persistent seizures and failing PT.  Of note, her most recent MRI brain obtained on 06/20/23 is concerning for recurrent tumor with extensive vasogenic edema.  Neurology consulted to assist with further evaluation and management of potential seizures.  Patient reports that had bad shaking day before yesterday and involved her entire body, yesterday she had a little bit of shaking and her face is still shaking a little and her voice is hoarse.   ROS   Limited ROS 2/2 mid encephalopathy. She denies any pain.  Past History   Past Medical History:  Diagnosis Date   Allergy    Anemia    Arthritis    "joints" (09/29/2013)   Breast cancer (HCC)    GERD (gastroesophageal reflux disease)    Heart murmur    History of blood transfusion    "related to chemo/breast cancer" (09/29/2013)   Hypertension    Migraines    Personal history of chemotherapy    Personal  history of radiation therapy    Radiation 11/05/13-12/24/13   Right breast    TMJ (temporomandibular joint syndrome)    "left; just dx'd" (09/29/2013   Past Surgical History:  Procedure Laterality Date   AXILLARY LYMPH NODE BIOPSY Right 03/10/2013   Procedure: AXILLARY LYMPH NODE BIOPSY;  Surgeon: Ernestene Mention, MD;  Location: [facility] OR;  Service: General;  Laterality: Right;  sentinel node with blue dye   BREAST BIOPSY     BREAST LUMPECTOMY Right 09/29/2013   needle localization w/axillary LND/notes 09/29/2013   BREAST LUMPECTOMY WITH NEEDLE LOCALIZATION AND AXILLARY LYMPH NODE DISSECTION Right 09/29/2013   Procedure: RIGHT BREAST NEEDLE LOCALIZED LUMPECTOMY AND AXILLARY LYMPH NODE DISSECTION;  Surgeon: Ernestene Mention, MD;  Location: [facility] OR;  Service: General;  Laterality: Right;   COLONOSCOPY N/A 04/30/2013   Procedure: COLONOSCOPY;  Surgeon: Graylin Shiver, MD;  Location: [facility] ENDOSCOPY;  Service: Endoscopy;  Laterality: N/A;   CRANIOTOMY Right 03/11/2022   Procedure: CRANIOTOMY TUMOR EXCISION;  Surgeon: Venetia Night, MD;  Location: [facility] ORS;  Service: Neurosurgery;  Laterality: Right;   ESOPHAGOGASTRODUODENOSCOPY N/A 04/28/2013   Procedure: ESOPHAGOGASTRODUODENOSCOPY (EGD);  Surgeon: Graylin Shiver, MD;  Location: [facility];  Service: Endoscopy;  Laterality: N/A;   MASTECTOMY Right 09/29/2013   "partial"   PORT-A-CATH REMOVAL  09/29/2013   PORT-A-CATH REMOVAL Left 09/29/2013   Procedure: REMOVAL PORT-A-CATH;  Surgeon: Ernestene Mention, MD;  Location: [facility] OR;  Service: General;  Laterality: Left;   PORTACATH PLACEMENT N/A  03/10/2013   Procedure: INSERTION PORT-A-CATH WITH FLUOROSCOPY AND ULTRASOUND;  Surgeon: Ernestene Mention, MD;  Location: [facility] OR;  Service: General;  Laterality: N/A;   SURAL NERVE BX Right 05/13/2014   Procedure: SURAL NERVE BIOPSY;  Surgeon: Hewitt Shorts, MD;  Location: [facility] NEURO ORS;  Service: Neurosurgery;  Laterality: Right;  Right sural nerve biopsy   TONSILLECTOMY     TOTAL  ABDOMINAL HYSTERECTOMY     With bilateral salpingo-oophorectomy   Family History  Problem Relation Age of Onset   Cancer Mother 62       Unknown type of cancer   Congestive Heart Failure Mother    Breast cancer Mother    Colon cancer Brother    Diabetes Brother 69   Heart disease Brother        AMI 05/22/2012   Congestive Heart Failure Brother    Cancer Maternal Grandmother 46       Unknown type of cancer   Breast cancer Maternal Grandmother    Social History   Socioeconomic History   Marital status: Divorced    Spouse name: n/a   Number of children: 1   Years of education: 17   Highest education level: Not on file  Occupational History   Occupation: retired    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 Currently work as Lawyer   Right handed    Lives with daughter and son in law    Social Determinants of Health   Financial Resource Strain: Not on file  Food Insecurity: No Food Insecurity (05/21/2023)   Hunger Vital Sign    Worried About Running Out of Food in the Last Year: Never true    Ran Out of Food in the Last Year: Never true  Transportation Needs: No Transportation Needs (05/21/2023)   PRAPARE - Administrator, Civil Service (Medical): No    Lack of Transportation (Non-Medical): No  Physical Activity: Not on file  Stress: Not on file  Social Connections: Not on file   Allergies  Allergen Reactions   Penicillins Rash    Face  swelling   Nsaids Nausea And Vomiting and Other (See Comments)    Pt states stomach ulcers, vomiting, and bleeding.    Medications  (Not in a hospital admission)    Vitals   Vitals:   06/21/23 1915 06/21/23 2015 06/21/23 2133 06/22/23 0022  BP: 123/72 112/69  (!) 147/125  Pulse: 86 82  76  Resp: 15 (!) 22  19  Temp:   98.2 F (36.8 C)   TempSrc:   Oral   SpO2: 100% 100%  100%  Weight:      Height:         Body mass index is 23.03 kg/m.  Physical Exam   Constitutional: Appears well-developed and well-nourished.  Psych: Affect appropriate to situation.  Eyes: No scleral  injection.  HENT: No OP obstrucion.  Head: Normocephalic.  Cardiovascular: Normal rate and regular rhythm.  Respiratory: Effort normal, non-labored breathing.  GI: Soft.  No distension. There is no tenderness.  Skin: WDI.   Neurologic Examination  Mental status/Cognition: Alert, oriented to self, place, but not to month. Oriented to year, good attention.  Speech/language: Fluent, comprehension intact, object naming intact, repetition intact. Cranial nerves:   CN II Pupils equal and reactive to light, no VF deficits    CN III,IV,VI EOM intact, no gaze preference or deviation, no nystagmus    CN V Feels rougher in left lower face.   CN VII Mild intermittent and at times rhythmic L facial twitching.   CN VIII normal hearing to speech    CN IX & X normal palatal elevation, no uvular deviation    CN XI 5/5 head turn and 5/5 shoulder shrug bilaterally    CN XII midline tongue protrusion    Motor:  Muscle bulk: poor, tone mildly increased in all extremities with impaired relaxation. However, no tonic contraction concerning for seizure. Mvmt Root Nerve  Muscle Right Left Comments  SA C5/6 Ax Deltoid 5 4   EF C5/6 Mc Biceps 5 4   EE C6/7/8 Rad Triceps 5 4   WF C6/7 Med FCR     WE C7/8 PIN ECU     F Ab C8/T1 U ADM/FDI 5 4   HF L1/2/3 Fem Illopsoas 4+ 4+   KE L2/3/4 Fem Quad     DF L4/5 D Peron Tib  Ant 5 5   PF S1/2 Tibial Grc/Sol 5 5    Sensation:  Light touch    Pin prick    Temperature    Vibration   Proprioception    Coordination/Complex Motor:  - Finger to Nose with ataxia in LUE. - Heel to shin unable to get her to probably because of mild confusion. - Rapid alternating movement are slowed on the left - Gait: deferred for patient safety.  Labs   CBC:  Recent Labs  Lab 06/21/23 2222  WBC 7.7  NEUTROABS 6.4  HGB 9.9*  HCT 29.9*  MCV 97.4  PLT 349    Basic Metabolic Panel:  Lab Results  Component Value Date   NA 136 06/21/2023   K 4.5 06/21/2023   CO2 26 06/21/2023   GLUCOSE 136 (H) 06/21/2023   BUN 19 06/21/2023   CREATININE 1.03 (H) 06/21/2023   CALCIUM 9.4 06/21/2023   GFRNONAA 57 (L) 06/21/2023   GFRAA 85 (L) 05/12/2014   Lipid Panel:  Lab Results  Component Value Date   LDLCALC 87 11/29/2011   HgbA1c:  Lab Results  Component Value Date   HGBA1C 7.1 (H) 04/27/2013   Urine Drug Screen: No results found for: "LABOPIA", "COCAINSCRNUR", "LABBENZ", "AMPHETMU", "THCU", "LABBARB"  Alcohol Level     Component Value Date/Time   ETH <10 02/10/2023 1940   INR  Lab Results  Component Value Date   INR 1.2 02/10/2023   APTT  Lab Results  Component Value Date   APTT 25 02/10/2023   AED levels:  Lab Results  Component Value Date   LEVETIRACETA 54.7 (H) 05/21/2023     CT Head without contrast(Personally reviewed): 1. Right posterior parietal craniotomy defect with a right posterior parietal lobe resection cavity. 2. Isodense area along the right posterior parietal dissection cavity which likely corresponds to findings seen within this region on the most recent MR head ( June 20, 2023) and is concerning for  the presence of recurrent tumor. 3. Generalized cerebral atrophy with microvascular disease changes of the supratentorial brain.  MRI Brain 06/20/23(Personally reviewed): 1. New from the prior brain MRI of 04/04/2023, there is  recurrent enhancing tumor along significant portions of the right parietal lobe (partially abutting the right parietal lobe resection cavity and involving the overlying dura). This measures 5.9 x 6.8 x 2.1 cm. Enhancement elsewhere along the right parietal lobe resection cavity has also increased in conspicuity. Most notably, there are now two nodular foci of enhancement at the anterior aspect of the resection cavity measuring up to 7 mm. This is also suspicious for recurrent tumor. Extensive surrounding T2 FLAIR hyperintense signal abnormality within the posterior right cerebral hemisphere has increased and likely reflects a combination of edema and post-treatment changes. No midline shift. 2. No new sites of intracranial metastatic disease elsewhere.  rEEG:  pending  Impression   Tabitha Ramirez is a 77 y.o. female with past medical history of breast cancer metastatic to the right parietal lobe of the brain status post r resection craniotomy and radiation, hyperlipidemia, seizures on Keppra who presents with left face and LUE twitching noted at her rehab. She still has subtle but visible intermittent left facial twitching which at times appears rhythmic.  With her hx of breast cancer s/p resection with new concern for recurrence and worsening vasogenic edema, I suspect that the noted intermittent L facial twitching is likely a focal motor seizure. Given this is clinically subtle, will get her up on LTM EEG.  Recommendations  - LTM EEG - cont Keppra 1000mg  TID - agree with Vimpat 100mg  BID - will load her with a one time dose of Valproic acid 1200mg (20mg /Kg) but hold off on starting any maintenance VPA. - Further AEDs based on LTM EEG. Clinically, her L facial twitching is subtle and barely perceptible making it difficult to monitor for ongoing seizures clinically. - neurology will cont to follow along. ______________________________________________________________________   Thank you  for the opportunity to take part in the care of this patient. If you have any further questions, please contact the neurology consultation team on call. Updated oncall schedule is listed on AMION.  Signed,  Erick Blinks

## 2023-06-22 NOTE — [place] FL2 (Signed)
[place] MEDICAID FL2 LEVEL OF CARE FORM     IDENTIFICATION  Patient Name: Tabitha Ramirez Birthdate: 07/16/1946 Sex: female Admission Date (Current Location): 06/21/2023  [facility] and [place] Number:  Producer, television/film/video and Address:  The . [facility], 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, right (HCC) 04/13/2022   Brain tumor (HCC) 03/17/2022   Palliative care encounter    Brain mass 03/09/2022   Seizure (HCC) 03/09/2022   Neuropathy 06/11/2014   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   Gait abnormality 01/26/2014   Unspecified hereditary and idiopathic peripheral neuropathy 01/26/2014   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   Neuropathic pain 01/20/2014   Anxiety 01/20/2014   Unspecified deficiency anemia 01/20/2014   Hx of neutropenia 08/04/2013   Jaw pain 08/04/2013   Leukocytosis 04/27/2013   Pericardial effusion 04/27/2013   Malignant neoplasm of female breast (HCC) 02/04/2013   Need for influenza vaccination 11/29/2011   HLD (hyperlipidemia) 03/07/2011   Post-menopausal 03/07/2011   Anemia 12/17/2010   Heart murmur 12/17/2010   Seasonal allergies 12/17/2010   HYPOKALEMIA 02/19/2009   Essential hypertension, benign 04/09/2008    Orientation RESPIRATION BLADDER Height & Weight     Self, Time, Situation, Place   Normal Incontinent Weight: 127 lb (57.6 kg) Height:  5\' 3"  (160 cm)  BEHAVIORAL SYMPTOMS/MOOD NEUROLOGICAL BOWEL NUTRITION STATUS    Convulsions/Seizures Continent Diet (Regular)  AMBULATORY STATUS COMMUNICATION OF NEEDS Skin   Extensive Assist Verbally Normal                       Personal Care Assistance Level of Assistance  Bathing, Feeding, Dressing Bathing Assistance: Maximum assistance Feeding assistance: Limited assistance Dressing Assistance: Maximum assistance     Functional Limitations Info  Sight Sight Info: Impaired        SPECIAL CARE FACTORS FREQUENCY  PT (By licensed PT), OT (By licensed OT)     PT Frequency: 5x/wk OT Frequency: 5x/wk            Contractures Contractures Info: Not present    Additional Factors Info  Code Status, Allergies Code Status Info: Full Allergies Info: Penicillins, Nsaids           Current Medications (06/22/2023):  This is the current hospital active medication list Current Facility-Administered Medications  Medication Dose Route Frequency Provider Last Rate Last Admin   anastrozole (ARIMIDEX) tablet 1 mg  1 mg Oral Daily Masters, Katie, DO   1 mg at 06/22/23 1100   dexamethasone (DECADRON) tablet 4 mg  4 mg Oral Q12H Masters, Katie, DO   4 mg at 06/22/23 1056   feeding supplement (ENSURE ENLIVE / ENSURE PLUS) liquid 237 mL  237 mL Oral BID BM Ginnie Smart, MD   237 mL at 06/22/23 1442   ferrous sulfate tablet 325 mg  325 mg  Oral Q breakfast Masters, Fabens, DO   325 mg at 06/22/23 1055   heparin injection 5,000 Units  5,000 Units Subcutaneous Q8H Masters, Katie, DO   5,000 Units at 06/22/23 1441   lacosamide (VIMPAT) tablet 100 mg  100 mg Oral BID Alvira Monday, MD   100 mg at 06/22/23 1055   levETIRAcetam (KEPPRA) tablet 1,000 mg  1,000 mg Oral TID Masters, Katie, DO   1,000 mg at 06/22/23 1055   levothyroxine (SYNTHROID) tablet 100 mcg  100 mcg Oral Q0600 Masters, Florentina Addison, DO   100 mcg at 06/22/23 0501    oxyCODONE (Oxy IR/ROXICODONE) immediate release tablet 5 mg  5 mg Oral Q12H PRN Masters, Katie, DO       rosuvastatin (CRESTOR) tablet 10 mg  10 mg Oral Daily Masters, Katie, DO   10 mg at 06/22/23 1056   triamterene-hydrochlorothiazide (MAXZIDE-25) 37.5-25 MG per tablet 1 tablet  1 tablet Oral Daily Masters, Katie, DO   1 tablet at 06/22/23 1100     Discharge Medications: Please see discharge summary for a list of discharge medications.  Relevant Imaging Results:  Relevant Lab Results:   Additional Information SS#: [id number]  Baldemar Lenis, LCSW

## 2023-06-22 NOTE — Progress Notes (Signed)
No [place] consult done prior to LTM order put in. Verifying order through neurology before hookup

## 2023-06-22 NOTE — Procedures (Incomplete)
Patient Name: Tabitha Ramirez  MRN: [id number]  Epilepsy Attending: [name]  Referring Physician/Provider: Erick Blinks, MD   Duration: 06/22/2023 1926 to 06/22/2023 1303   Patient history: 77 y.o. 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 EEG to evaluate for seizure.   Level of alertness: Awake, asleep   AEDs during EEG study: LEV, LCM, VPA   Technical aspects: This EEG study was done with scalp electrodes positioned according to the 10-20 International system of electrode placement. Electrical activity was reviewed with band pass filter of 1-70Hz , sensitivity of 7 uV/mm, display speed of 80mm/sec with a 60Hz  notched filter applied as appropriate. EEG data were recorded continuously and digitally stored.  Video monitoring was available and reviewed as appropriate.   Description: The posterior dominant rhythm consists of 7 Hz activity of moderate voltage (25-35 uV) seen predominantly in posterior head regions, symmetric and reactive to eye opening and eye closing.  Sleep was characterized by vertex waves, sleep spindles (12 to 14 Hz), maximal frontocentral region. EEG showed continuous generalized 3 to 6 Hz theta-delta slowing. EEG also showed continuous polymorphic sharply contoured 3-6 theta- delta slowing admixed in right parietal region consistent with breach artifact. Sharp waves were noted in right parietal region at  times qasi- periodic at 1 Hz. Hyperventilation and photic stimulation were not performed.      ABNORMALITY - Sharp waves, right parietal region - Breach artifact, right parietal region - Continuous slow, generalized and maximal right parietal region  IMPRESSION: This study showed evidence of epileptogenicity arising from right hemisphere, maximal  right parietal region. Additionally there is cortical dysfunction in right parietal region consistent with underlying craniotomy. Lastly there was moderate diffuse encephalopathy. No seizures were noted   [name]                             Patient Name: Tabitha Ramirez  MRN: [id number]  Epilepsy Attending: [name]  Referring Physician/Provider:  Date: @TODAY @  Duration:   Patient history:   Level of alertness: Awake, drowsy, sleep, comatose, lethargic ***  AEDs during EEG study:   Technical aspects: This EEG study was done with scalp electrodes positioned according to the 10-20 International system of electrode placement. Electrical activity was reviewed with band pass filter of 1-70Hz , sensitivity of 7 uV/mm, display speed of 59mm/sec with a 60Hz  notched filter applied as appropriate. EEG data were recorded continuously and digitally stored.  Video monitoring was available and reviewed as appropriate.  Description: Drowsiness was characterized by attenuation of the posterior background rhythm. Sleep was characterized by vertex waves, sleep spindles (12 to 14 Hz), maximal frontocentral region.  There is an excessive amount of 15 to 18 Hz, 2-3 uV beta activity with irregular morphology distributed symmetrically and diffusely.   EEG showed continuous/intermittent generalized polymorphic sharply contoured 3 to 6 Hz theta-delta slowing.  EEG showed generalized periodic discharges with triphasic morphology at  Hz, more prominent when awake/stimulated.  Generalized Spike/Polyspikes/Sharp waves were noted in left/right frontal/temporal/parietal/occipital region.   Seizure was noted arising from left/right frontal/temporal/parietal/occipital region.  During seizure, patient was noted to.  Onset of seizure, total duration  Event button was pressed on at for .  Concomitant EEG before, during and after the event showed normal  posterior dominant rhythm,  did not show any EEG changes suggest seizure.  Patient was noted to have episodes of brief sudden eye opening with whole body jerking every few seconds.  Concomitant EEG showed generalized polyspikes consistent with myoclonic seizures.  In between seizures EEG showed generalized background suppression.   Hyperventilation did not show any EEG change.  Physiologic photic driving was not seen during photic stimulation.  Hyperventilation and photic stimulation were not performed.     ABNORMALITY -Sharp wave, generalized, left/right frontal/temporal/parietal/occipital region.  -Spike,generalized, left/right frontal/temporal/parietal/occipital region.  -Polyspikes, generalized, left/right frontal/temporal/parietal/occipital region.  - Lateralized periodic discharges with overriding fast activity ( LPD +) left/right, maximal frontal/temporal/parietal/occipital region - Periodic discharges with triphasic morphology, generalized ( GPDs) - Intermittent slow, generalized - Continuous slow, generalized - Excessive beta, generalized    IMPRESSION: This study is within normal limits.  This study is suggestive of mild/moderate/severe diffuse encephalopathy, nonspecific etiology but likely related to sedation, toxic-metabolic etiology, anoxic/hypoxic brain injury This study is suggestive of cortical dysfunction arising from left/right frontal/temporal/parietal/occipital region, nonspecific etiology, likely secondary to underlying structural abnormality This study showed evidence of potential epileptogenicity arising from This study showed seizures arising from No seizures or epileptiform discharges were seen throughout the recording. However, only wakefulness and drowsiness were recorded. If suspicion for interictal activity remains a concern, a prolonged study including sleep should be considered.  The excessive beta activity seen in the background is most likely due to the effect of benzodiazepine and is  a benign EEG pattern. Patient was noted to have myoclonic seizures every few seconds.  Additionally there was evidence of severe to profound diffuse encephalopathy.  In the setting of cardiac arrest, this EEG pattern is suggestive of anoxic/hypoxic brain injury.  Dr. was notified.  A normal interictal EEG does not exclude nor support the diagnosis of epilepsy.   Eldredge Veldhuizen [name]

## 2023-06-22 NOTE — Progress Notes (Signed)
Summary: Tabitha Ramirez is a 77 y.o. person living with a history of metastatic breast cancer to the brain s/p right parietal craniotomy and tumor resection (2023), HLD, HTN, GERD, hypothyroidism, who presented to the ED on 9/26 with facial twitching and left sided weakness. She was admitted to the Internal Medicine Teaching Service on 9/27 for further neurology workup due to concerns for facial motor seizure.   Subjective:  No new events since admission. Patient reports feeling ok, did not get much sleep. Voice sounds hoarse. Denies any CP, headache, heart racing/palpitations, SOB, or acute pain. Has noticed slight left sided shaking/twitching this morning. Denies any problems with eating or swallowing. Denies any dysuria. Ambulates with a walker and some assistance.   Objective:  Vital signs in last 24 hours: Vitals:   06/22/23 0022 06/22/23 0137 06/22/23 0344 06/22/23 0900  BP: (!) 147/125 118/68 (!) 98/51 129/74  Pulse: 76 76 69 74  Resp: 19 19 20 18   Temp:  98 F (36.7 C) 97.8 F (36.6 C) (!) 97.5 F (36.4 C)  TempSrc:  Oral Oral Oral  SpO2: 100% 100% 97% 98%  Weight:  57.6 kg    Height:  5\' 3"  (1.6 m)        Latest Ref Rng & Units 06/21/2023   10:22 PM 05/23/2023    6:30 AM 05/22/2023    4:04 AM  CBC  WBC 4.0 - 10.5 K/uL 7.7  11.3  7.5   Hemoglobin 12.0 - 15.0 g/dL 9.9  10.2  9.8   Hematocrit 36.0 - 46.0 % 29.9  30.0  28.8   Platelets 150 - 400 K/uL 349  128  270        Latest Ref Rng & Units 06/21/2023   10:22 PM 05/23/2023    6:30 AM 05/22/2023    4:04 AM  CMP  Glucose 70 - 99 mg/dL 725  366  65   BUN 8 - 23 mg/dL 19  13  11    Creatinine 0.44 - 1.00 mg/dL 4.40  3.47  4.25   Sodium 135 - 145 mmol/L 136  137  139   Potassium 3.5 - 5.1 mmol/L 4.5  3.9  3.2   Chloride 98 - 111 mmol/L 100  104  103   CO2 22 - 32 mmol/L 26  25  24    Calcium 8.9 - 10.3 mg/dL 9.4  8.9  9.0   Total Protein 6.5 - 8.1 g/dL 7.8     Total Bilirubin 0.3 - 1.2 mg/dL 0.6     Alkaline Phos 38 -  126 U/L 71     AST 15 - 41 U/L 23     ALT 0 - 44 U/L 27       MRSA negative  Physical Exam Constitutional: Patient is in no acute distress, asking and answering questions appropriately.  CV: Regular rate and rhythm without murmurs on auscultation. No LE edema.  Pulmonary/Respiratory: Clear lungs bilaterally, normal respiratory effort on room air.  Abdominal: Soft, non-tender, non-distended, positive bowel sounds.  Skin: Warm and dry.  Neuro: Patient is alert and oriented to self, DOB, place, date (stated June 21, 2023), and situation. No facial droop or facial weakness observed. Facial sensation diminished on left side, left eye twitching observed. Able to lift both upper extremities without assistance, does have 4/5 strength on left against resistance. Lower extremity strength seems about the same, sensation intact.  Psych: Normal mood and affect.   Imaging EEG: per neurology, EEG showed evidence of: -  Epileptogenicity arising from right hemisphere, maximal right parietal region.  - Additionally there is cortical dysfunction in right parietal region consistent with underlying craniotomy.  - Moderate diffuse encephalopathy.  - No seizures noted.  Assessment/Plan:  Active Problems:   Malignant neoplasm of female breast [facility])   Facial twitching  Patient is a 77 y.o. with a history of metastatic breast cancer to the brain who presented to the ED on 9/26 with facial twitching and left sided weakness and admitted to the Internal Medicine Teaching Service on 9/27 for further neurology workup due to concerns for facial motor seizure.   #Hx of Breast cancer with metastasis to brain s/p resection #Left sided facial twitching   Patient has a hx of Metastatic breast cancer to the brain s/p right parietal craniotomy for tumor resection on 03/11/2022 by Dr. Myer Haff at [facility]. Of note, patient with recent hospitalization 05/21/2023 for breakthrough seizures, discharged to [facility]  where she has been since. On current admission, patient presented for facial twitching and left sided weakness after having a full body seizure at the rehab center. Dr. Barbaraann Cao recommended increasing VIMPAT to 100 mg BID to help manage twitching, but daughter concerned for ongoing seizures and decided to come in to ED for further evaluation.   MRI on admission showed recurrent enhancing tumor along significant portions of the right parietal lobe measuring 5.9 x 6.8 x 2.1 cm concerning for recurrent tumor in resection site. Neurology following, EEG study showing epileptogenecity arising from R hemisphere specifically right parietal region, cortical dysfunction in right parietal region, and moderate diffuse encephalopathy. No seizure activity noted so far.    [name], [name], involved in medical decision-making. Will consult palliative care team to further discuss goals of care given recurrent hospitalizations for breakthrough seizures and new left-sided twitching in the setting of tumor recurrence.   Patient denying any acute pain or headache but will keep PRN medication for breakthrough pain.   Plan: - Will follow up with neurology recommendations - Restart Anastrozole/Arimidex 1 mg daily - Restart valproate/Decadron 4 mg q12h  - Restart levetiracetam/Keppra 1000 mg TID - Oxy 5 mg q12h PRN for breakthrough pain  - Increase lacosamide/Vimpat 100 mg BID per Dr Barbaraann Cao - Follow up with Dr Barbaraann Cao - PT/OT eval treat  - Will plan on discussing goals of care with family and palliative care team    Chronic Conditions  #HLD- Restarted home rosuvastatin 10 mg daily   #HTN - Restarted home triamterene- HTCZ 37.5 mg-25 mg daily   #Hypothyroidism - Restarted home levothyroxine 100 mcg daily   #Anemia- Restart home iron sulfate tablet 325 mg   Diet: Regular + feeding supplement BID between meals VTE: sq Heparin Code: Full  Prior to Admission Living Arrangement: [facility]  Rehab Anticipated Discharge Location: pending therapy recommendations Barriers to Discharge: medical management  Dispo: Anticipated discharge in approximately 1-2 day(s).   Philomena Doheny, MD, PGY-1 06/22/2023, 10:49 AM Pager: [phone] After 5pm on weekdays and 1pm on weekends: On Call pager [phone]

## 2023-06-22 NOTE — Progress Notes (Signed)
Notified by nursing that patient's daughter [name] was in patient's room. Palliative medicine team member Sarina Ser, NP also in room. Discussed MRI results, provided updates on other lab findings, and addressed patient concerns. Patient and her daughter appeared surprised and saddened by news of tumor recurrence. Let them know internal medicine would be in touch with Dr. Barbaraann Cao, the patient's neuro-oncologist and will provide updates as needed.   Spoke with Dr. Barbaraann Cao on the phone around 1300 today regarding findings and current admission. Per Dr. Barbaraann Cao, aware of tumor recurrence and plans to discuss findings with Dr. Kathrynn Running (radiology) and tumor board next week. 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 Dr. Barbaraann Cao planning on reviewing findings and possible next steps at patient's clinic appointment on Monday, September 30th. Dr. Barbaraann Cao also made aware that palliative team has been engaged to participate in goals of care conversations with the patient and her family during this admission.   Grayling Schranz Colbert Coyer, MD PGY-1 Internal Medicine Teaching Service

## 2023-06-22 NOTE — Consult Note (Signed)
Palliative Care Consult Note                                  Date: 06/22/2023   Patient Name: Tabitha Ramirez  DOB: May 21, 1946  MRN: [id number]  Age / Sex: 77 y.o., female  PCP: [facility] Referring Physician: Ginnie Smart, MD  Reason for Consultation: Establishing goals of care  HPI/Patient Profile: 77 y.o. female  with past medical history of metastatic breast cancer to the brain s/p right parietal craniotomy and tumor resection (2023), HLD, HTN, GERD, hypothyroidism admitted on 06/21/2023 with facial twitching and left sided weakness..   Past Medical History:  Diagnosis Date   Allergy    Anemia    Arthritis    "joints" (09/29/2013)   Breast cancer (HCC)    GERD (gastroesophageal reflux disease)    Heart murmur    History of blood transfusion    "related to chemo/breast cancer" (09/29/2013)   Hypertension    Migraines    Personal history of chemotherapy    Personal history of radiation therapy    Radiation 11/05/13-12/24/13   Right breast    TMJ (temporomandibular joint syndrome)    "left; just dx'd" (09/29/2013    Subjective:   I have reviewed medical records including EPIC notes, labs and imaging, assessed the patient and then met with her and her daughter Tabitha Ramirez to discuss diagnosis prognosis, GOC, EOL wishes, disposition and options. Internal medicine provider present for meeting.  I introduced Palliative Medicine as specialized medical care for people living with serious illness. It focuses on providing relief from symptoms and stress of a serious illness. The goal is to improve quality of life for both the patient and the family.  Today's Discussion: Patient and daughter have a good understanding of her chronic illnesses and current hospitalization. She has been hospitalized several times over the last six months with seizures. Provider discussed recurrence of right parietal mass. Patient admits she  is shocked with this information. Patient would like to hear from the oncologist for any treatment options.  Patient has a daughter that she lived with prior to her rehab stay. At rehab the patient was able to do many ADLs with assistance. Her daughter usually helps her with her medications.  Recommended consideration of DNR status, understanding evidenced-based poor outcomes in similar hospitalized patients, as the cause of the arrest is likely associated with chronic/terminal disease rather than a reversible acute cardio-pulmonary event. Patient would like to change her code status to DNR. She would also like to complete an advanced directive to make her daughter her HCPOA. It was too much to discuss scope of care today, so we agreed to discuss scope of care over the weekend when her daughter is present.  Discussed the importance of continued conversation with family and the medical providers regarding overall plan of care and treatment options, ensuring decisions are within the context of the patient's values and GOCs.  Questions and concerns were addressed. The patient's daughter was encouraged to call with questions or concerns. PMT will continue to support holistically.  Review of Systems  Neurological:        Facial twitching    Objective:   Primary Diagnoses: Present on Admission:  Malignant neoplasm of female breast [facility])   Physical Exam Vitals reviewed.  Cardiovascular:     Rate and Rhythm: Normal rate.  Pulmonary:     Effort: Pulmonary effort  is normal.  Skin:    General: Skin is warm and dry.  Neurological:     Mental Status: She is alert and oriented to person, place, and time.     Comments: Left facial twitching  Psychiatric:        Mood and Affect: Mood normal.        Behavior: Behavior normal.     Vital Signs:  BP 114/61 (BP Location: Left Arm)   Pulse 87   Temp 98 F (36.7 C) (Oral)   Resp 16   Ht 5\' 3"  (1.6 m)   Wt 57.6 kg   SpO2 98%   BMI 22.50 kg/m      Advanced Care Planning:   Existing Vynca/ACP Documentation: None  Primary Decision Maker: PATIENT. Patient would like her daughter Tabitha Ramirez to be HCPOA.  Code Status/Advance Care Planning: DNR   Assessment & Plan:   SUMMARY OF RECOMMENDATIONS   Changed to DNR Full scope Continued discussion re: goals of care HCPOA paperwork to be completed-- consult spiritual care  Continued PMT support  Discussed with: bedside RN and provider  Time Total: 75 minutes  Thank you for allowing [place] to participate in the care of Tabitha Ramirez PMT will continue to support holistically.   Signed by: Sarina Ser, NP Palliative Medicine Team  Team Phone # [phone] (Nights/Weekends)  06/22/2023, 12:19 PM

## 2023-06-22 NOTE — Care Management Obs Status (Signed)
MEDICARE OBSERVATION STATUS NOTIFICATION   Patient Details  Name: Tabitha Ramirez MRN: [id number] Date of Birth: 08-Jul-1946   Medicare Observation Status Notification Given:  Yes    Baldemar Lenis, LCSW 06/22/2023, 3:31 PM

## 2023-06-22 NOTE — ED Notes (Signed)
ED TO INPATIENT HANDOFF REPORT  ED Nurse Name and Phone #: Toyna Erisman [phone]  S Name/Age/Gender Tabitha Ramirez 77 y.o. female Room/Bed: 024C/024C  Code Status   Code Status: Full Code  Home/SNF/Other Rehab Patient oriented to: self, place, time, and situation Is this baseline? Yes   Triage Complete: Triage complete  Chief Complaint Tonic-clonic seizure [facility]) [G40.409]  Triage Note Per EMS and pt report, pt had a [place] seizure last night around 8pm. Pt was diagnosed with a brain tumor last year and had surgery to remove it. Were unable to remove all of it. Pt has had occasional seizures and left sided weakness since diagnosis. Pt was sent to imaging last night for MRI, but haven't been told any results. EMS was called to send her to ED for observation. Pt's daughter told EMS that the left sided weakness is worse after the seizure last night.   Allergies Allergies  Allergen Reactions   Penicillins Rash    Face swelling   Nsaids Nausea And Vomiting and Other (See Comments)    Pt states stomach ulcers, vomiting, and bleeding.    Level of Care/Admitting Diagnosis ED Disposition     ED Disposition  Admit   Condition  --   Comment  Hospital Area: [facility] [[id number]]  Level of Care: Telemetry Medical [[id number]]  May place patient in observation at [facility] or [facility] if equivalent level of care is available:: No  Covid Evaluation: Asymptomatic - no recent exposure (last 10 days) testing not required  Diagnosis: Tonic-clonic seizure [facility]) [[id number]]  Admitting Physician: Ginnie Smart [[id number]]  Attending Physician: Ninetta Lights, JEFFREY C [[id number]]          B Medical/Surgery History Past Medical History:  Diagnosis Date   Allergy    Anemia    Arthritis    "joints" (09/29/2013)   Breast cancer (HCC)    GERD (gastroesophageal reflux disease)    Heart murmur    History of blood transfusion    "related to chemo/breast cancer" (09/29/2013)   Hypertension     Migraines    Personal history of chemotherapy    Personal history of radiation therapy    Radiation 11/05/13-12/24/13   Right breast    TMJ (temporomandibular joint syndrome)    "left; just dx'd" (09/29/2013   Past Surgical History:  Procedure Laterality Date   AXILLARY LYMPH NODE BIOPSY Right 03/10/2013   Procedure: AXILLARY LYMPH NODE BIOPSY;  Surgeon: Ernestene Mention, MD;  Location: [facility] OR;  Service: General;  Laterality: Right;  sentinel node with blue dye   BREAST BIOPSY     BREAST LUMPECTOMY Right 09/29/2013   needle localization w/axillary LND/notes 09/29/2013   BREAST LUMPECTOMY WITH NEEDLE LOCALIZATION AND AXILLARY LYMPH NODE DISSECTION Right 09/29/2013   Procedure: RIGHT BREAST NEEDLE LOCALIZED LUMPECTOMY AND AXILLARY LYMPH NODE DISSECTION;  Surgeon: Ernestene Mention, MD;  Location: [facility] OR;  Service: General;  Laterality: Right;   COLONOSCOPY N/A 04/30/2013   Procedure: COLONOSCOPY;  Surgeon: Graylin Shiver, MD;  Location: [facility] ENDOSCOPY;  Service: Endoscopy;  Laterality: N/A;   CRANIOTOMY Right 03/11/2022   Procedure: CRANIOTOMY TUMOR EXCISION;  Surgeon: Venetia Night, MD;  Location: [facility] ORS;  Service: Neurosurgery;  Laterality: Right;   ESOPHAGOGASTRODUODENOSCOPY N/A 04/28/2013   Procedure: ESOPHAGOGASTRODUODENOSCOPY (EGD);  Surgeon: Graylin Shiver, MD;  Location: [facility];  Service: Endoscopy;  Laterality: N/A;   MASTECTOMY Right 09/29/2013   "partial"   PORT-A-CATH REMOVAL  09/29/2013   PORT-A-CATH REMOVAL Left  09/29/2013   Procedure: REMOVAL PORT-A-CATH;  Surgeon: Ernestene Mention, MD;  Location: [facility] OR;  Service: General;  Laterality: Left;   PORTACATH PLACEMENT N/A 03/10/2013   Procedure: INSERTION PORT-A-CATH WITH FLUOROSCOPY AND ULTRASOUND;  Surgeon: Ernestene Mention, MD;  Location: [facility] OR;  Service: General;  Laterality: N/A;   SURAL NERVE BX Right 05/13/2014   Procedure: SURAL NERVE BIOPSY;  Surgeon: Hewitt Shorts, MD;  Location: [facility] NEURO ORS;  Service: Neurosurgery;  Laterality:  Right;  Right sural nerve biopsy   TONSILLECTOMY     TOTAL ABDOMINAL HYSTERECTOMY     With bilateral salpingo-oophorectomy     A IV Location/Drains/Wounds Patient Lines/Drains/Airways Status     Active Line/Drains/Airways     Name Placement date Placement time Site Days   Peripheral IV 06/21/23 20 G Left Antecubital 06/21/23  2230  Antecubital  1            Intake/Output Last 24 hours No intake or output data in the 24 hours ending 06/22/23 0042  Labs/Imaging Results for orders placed or performed during the hospital encounter of 06/21/23 (from the past 48 hour(s))  CBC with Differential     Status: Abnormal   Collection Time: 06/21/23 10:22 PM  Result Value Ref Range   WBC 7.7 4.0 - 10.5 K/uL   RBC 3.07 (L) 3.87 - 5.11 MIL/uL   Hemoglobin 9.9 (L) 12.0 - 15.0 g/dL   HCT 16.1 (L) 09.6 - 04.5 %   MCV 97.4 80.0 - 100.0 fL   MCH 32.2 26.0 - 34.0 pg   MCHC 33.1 30.0 - 36.0 g/dL   RDW 40.9 81.1 - 91.4 %   Platelets 349 150 - 400 K/uL   nRBC 0.0 0.0 - 0.2 %   Neutrophils Relative % 84 %   Neutro Abs 6.4 1.7 - 7.7 K/uL   Lymphocytes Relative 15 %   Lymphs Abs 1.2 0.7 - 4.0 K/uL   Monocytes Relative 1 %   Monocytes Absolute 0.1 0.1 - 1.0 K/uL   Eosinophils Relative 0 %   Eosinophils Absolute 0.0 0.0 - 0.5 K/uL   Basophils Relative 0 %   Basophils Absolute 0.0 0.0 - 0.1 K/uL   Immature Granulocytes 0 %   Abs Immature Granulocytes 0.03 0.00 - 0.07 K/uL    Comment: Performed at [facility], [address], [place], [place] [address]  Comprehensive metabolic panel     Status: Abnormal   Collection Time: 06/21/23 10:22 PM  Result Value Ref Range   Sodium 136 135 - 145 mmol/L   Potassium 4.5 3.5 - 5.1 mmol/L   Chloride 100 98 - 111 mmol/L   CO2 26 22 - 32 mmol/L   Glucose, Bld 136 (H) 70 - 99 mg/dL    Comment: Glucose reference range applies only to samples taken after fasting for at least 8 hours.   BUN 19 8 - 23 mg/dL   Creatinine, Ser 6.21 (H) 0.44 - 1.00  mg/dL   Calcium 9.4 8.9 - 30.8 mg/dL   Total Protein 7.8 6.5 - 8.1 g/dL   Albumin 2.9 (L) 3.5 - 5.0 g/dL   AST 23 15 - 41 U/L   ALT 27 0 - 44 U/L   Alkaline Phosphatase 71 38 - 126 U/L   Total Bilirubin 0.6 0.3 - 1.2 mg/dL   GFR, Estimated 57 (L) >60 mL/min    Comment: (NOTE) Calculated using the CKD-EPI Creatinine Equation (2021)    Anion gap 10 5 - 15  Comment: Performed at [facility], [address], [place], [place] [address]  Magnesium     Status: None   Collection Time: 06/21/23 10:22 PM  Result Value Ref Range   Magnesium 1.8 1.7 - 2.4 mg/dL    Comment: Performed at [facility], [address] [place], [place] [address]   MR Brain W Wo Contrast  Result Date: 06/21/2023 CLINICAL DATA:  Provided history: Metastasis to brain. Brain metastases, assess treatment response. EXAM: MRI HEAD WITHOUT AND WITH CONTRAST TECHNIQUE: Multiplanar, multiecho pulse sequences of the brain and surrounding structures were obtained without and with intravenous contrast. CONTRAST:  7 mL Vueway intravenous contrast. COMPARISON:  Head CT 05/21/2023. Prior brain MRI examinations 04/04/2023 and earlier. FINDINGS: Brain: Postoperative sequelae from prior right parietal craniotomy resection. New from the brain MRI of 04/04/2023, there is recurrent enhancing tumor along significant portions of the right parietal lobe, partially abutting the right parietal lobe resection cavity and involving the overlying dura. This measures 5.9 x 6.8 x 2.1 cm. Enhancement elsewhere along the right parietal lobe resection cavity has also increased in conspicuity. Most notably, there are now two nodular foci of enhancement at the anterior aspect of the resection cavity measuring up to 7 mm (for instance as seen on series 14, images 104-106). Extensive surrounding T2 FLAIR hyperintense signal abnormality within the posterior right cerebral hemisphere has progressed, and likely reflects a combination of edema and  post-treatment changes. No new sites of intracranial metastatic disease elsewhere. Multifocal T2 FLAIR hyperintense signal abnormality elsewhere within the cerebral white matter and pons, overall moderate in severity. Findings are nonspecific, but most often secondary to chronic small vessel ischemia. Chronic hemosiderin deposition again demonstrated along the margins of the right parietal lobe resection cavity and along the adjacent right parietal lobe. There is no acute infarct. No extra-axial fluid collection. No midline shift. Vascular: Maintained flow voids within the proximal large arterial vessels. Skull and upper cervical spine: Right parietal cranioplasty. No focal suspicious marrow lesion. Incompletely assessed cervical spondylosis. Sinuses/Orbits: No mass or acute finding within the imaged orbits. Small fluid level within the right maxillary sinus. Impression #1 will be called to the ordering clinician or representative by the Radiologist Assistant, and communication documented in the PACS or [employer]. IMPRESSION: 1. New from the prior brain MRI of 04/04/2023, there is recurrent enhancing tumor along significant portions of the right parietal lobe (partially abutting the right parietal lobe resection cavity and involving the overlying dura). This measures 5.9 x 6.8 x 2.1 cm. Enhancement elsewhere along the right parietal lobe resection cavity has also increased in conspicuity. Most notably, there are now two nodular foci of enhancement at the anterior aspect of the resection cavity measuring up to 7 mm. This is also suspicious for recurrent tumor. Extensive surrounding T2 FLAIR hyperintense signal abnormality within the posterior right cerebral hemisphere has increased and likely reflects a combination of edema and post-treatment changes. No midline shift. 2. No new sites of intracranial metastatic disease elsewhere. Electronically Signed   By: Jackey Loge D.O.   On: 06/21/2023 14:31    Pending  Labs Unresulted Labs (From admission, onward)     Start     Ordered   06/22/23 0500  Comprehensive metabolic panel  Tomorrow morning,   R        06/22/23 0028   06/22/23 0500  CBC  Tomorrow morning,   R        06/22/23 0028  Vitals/Pain Today's Vitals   06/21/23 1915 06/21/23 2015 06/21/23 2133 06/22/23 0022  BP: 123/72 112/69  (!) 147/125  Pulse: 86 82  76  Resp: 15 (!) 22  19  Temp:   98.2 F (36.8 C)   TempSrc:   Oral   SpO2: 100% 100%  100%  Weight:      Height:      PainSc:        Isolation Precautions No active isolations  Medications Medications  lacosamide (VIMPAT) tablet 100 mg (100 mg Oral Given 06/22/23 0019)  oxyCODONE (Oxy IR/ROXICODONE) immediate release tablet 5 mg (has no administration in time range)  heparin injection 5,000 Units (has no administration in time range)  dexamethasone (DECADRON) tablet 4 mg (has no administration in time range)  levETIRAcetam (KEPPRA) tablet 1,000 mg (has no administration in time range)  levothyroxine (SYNTHROID) tablet 100 mcg (has no administration in time range)  anastrozole (ARIMIDEX) tablet 1 mg (has no administration in time range)  ferrous sulfate tablet 325 mg (has no administration in time range)  rosuvastatin (CRESTOR) tablet 10 mg (has no administration in time range)  triamterene-hydrochlorothiazide (MAXZIDE-25) 37.5-25 MG per tablet 1 tablet (has no administration in time range)  dexamethasone (DECADRON) tablet 4 mg (4 mg Oral Given 06/21/23 1345)    Mobility walks with device     Focused Assessments Seizures   R Recommendations: See Admitting Provider Note  Report given to:   Additional Notes: n/a

## 2023-06-23 DIAGNOSIS — Z7189 Other specified counseling: Secondary | ICD-10-CM | POA: Diagnosis not present

## 2023-06-23 DIAGNOSIS — Z515 Encounter for palliative care: Secondary | ICD-10-CM | POA: Diagnosis not present

## 2023-06-23 DIAGNOSIS — C50911 Malignant neoplasm of unspecified site of right female breast: Secondary | ICD-10-CM

## 2023-06-23 DIAGNOSIS — G40409 Other generalized epilepsy and epileptic syndromes, not intractable, without status epilepticus: Secondary | ICD-10-CM

## 2023-06-23 NOTE — Progress Notes (Signed)
Daily Progress Note   Patient Name: Tabitha Ramirez       Date: 06/23/2023 DOB: 02-22-1946  Age: 77 y.o. MRN#: [id number] Attending Physician: Ginnie Smart, MD Primary Care Physician: Duard Larsen Primary Care Admit Date: 06/21/2023  Reason for Consultation/Follow-up: Establishing goals of care  Length of Stay: 1  Current Medications: Scheduled Meds:   anastrozole  1 mg Oral Daily   dexamethasone  4 mg Oral Q12H   feeding supplement  237 mL Oral BID BM   ferrous sulfate  325 mg Oral Q breakfast   heparin  5,000 Units Subcutaneous Q8H   lacosamide  100 mg Oral BID   levETIRAcetam  1,000 mg Oral TID   levothyroxine  100 mcg Oral Q0600   rosuvastatin  10 mg Oral Daily   triamterene-hydrochlorothiazide  1 tablet Oral Daily    Continuous Infusions:   PRN Meds: oxyCODONE  Physical Exam Vitals reviewed.  HENT:     Head: Normocephalic and atraumatic.  Cardiovascular:     Rate and Rhythm: Normal rate.  Pulmonary:     Effort: Pulmonary effort is normal.  Skin:    General: Skin is warm and dry.  Neurological:     Mental Status: She is alert and oriented to person, place, and time.  Psychiatric:        Mood and Affect: Mood normal.        Behavior: Behavior normal.             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, right (HCC) 04/13/2022   Brain tumor (HCC) 03/17/2022   Palliative care encounter    Brain mass 03/09/2022   Seizure (HCC) 03/09/2022   Neuropathy 06/11/2014   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  Gait abnormality 01/26/2014   Unspecified hereditary and idiopathic peripheral neuropathy 01/26/2014   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   Neuropathic pain 01/20/2014   Anxiety 01/20/2014   Unspecified deficiency anemia 01/20/2014   Hx of neutropenia 08/04/2013   Jaw pain 08/04/2013   Leukocytosis 04/27/2013   Pericardial effusion 04/27/2013   Malignant neoplasm of female breast (HCC) 02/04/2013   Need for influenza vaccination 11/29/2011   HLD (hyperlipidemia) 03/07/2011   Post-menopausal 03/07/2011   Anemia 12/17/2010   Heart murmur 12/17/2010   Seasonal allergies 12/17/2010   HYPOKALEMIA 02/19/2009   Essential hypertension, benign 04/09/2008    Palliative Care Assessment & Plan   Patient Profile: 77 y.o. female  with past medical history of metastatic breast cancer to the brain s/p right parietal craniotomy and tumor resection (2023), HLD, HTN, GERD, hypothyroidism admitted on 06/21/2023 with facial twitching and left sided weakness.  Assessment: Follow up with patient and daughter. We discussed the recurrence of right parietal mass. Spoke individually with daughter [name] first. We discussed establishing goals of care for her mother which will provide guidance moving forward.   Met with patient and discussed the recurrence of right parietal mass. She would like to follow up with oncology to hear what they have to offer. Encouraged her to explore with oncology what her life would look like with and without treatment and emphasized the importance of considering her quality of life and what would be acceptable to her in the future. She states she would like to be able to spend time with her family and that she has been through cancer  therapy before. Patient faces treatment option decisions and anticipatory care needs.  Completed a MOST form today. The patient and family outlined their wishes for the following treatment decisions:  Cardiopulmonary Resuscitation: Do Not Attempt Resuscitation (DNR/No CPR)  Medical Interventions: Limited Additional Interventions: Use medical treatment, IV fluids and cardiac monitoring as indicated, DO NOT USE intubation or mechanical ventilation. May consider use of less invasive airway support such as BiPAP or CPAP. Also provide comfort measures. Transfer to the hospital if indicated. Avoid intensive care.   Antibiotics: Left Blank  IV Fluids: IV fluids for a defined trial period  Feeding Tube: No feeding tube    Discussed the importance of continued conversation with family and the medical providers regarding overall plan of care and treatment options, ensuring decisions are within the context of the patient's values and GOCs.   Questions and concerns were addressed. The patient's daughter was encouraged to call with questions or concerns. PMT will continue to support holistically while hospitalized with outpatient follow up with [facility].  Recommendations/Plan: DNR Limited Scope of Treatment: No intubation or feeding tube Encouraged continued discussion re: goals of care  Outpatient follow up with [facility] with outpatient palliative Continued PMT support   Extensive chart review has been completed prior to seeing the patient and speaking with her daughter [name] including labs, vital signs, imagine, progress/consult notes, orders, medications, and available advance directive documents.  Care plan was discussed with attending team and bedside RN  Time spent: 75 minutes  Thank you for allowing the Palliative Medicine Team to assist in the care of this patient.    Sherryll Burger, NP  Please contact Palliative Medicine Team phone at [phone] for questions and concerns.

## 2023-06-23 NOTE — Procedures (Signed)
atient Name: Tabitha Ramirez  MRN: [id number]  Epilepsy Attending: [name]  Referring Physician/Provider: Erick Blinks, MD   Duration: 06/23/2023 0450 to 06/23/2023 1353   Patient history: 76 y.o. 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 EEG to evaluate for seizure.   Level of alertness: Awake, asleep   AEDs during EEG study: LEV, LCM, VPA   Technical aspects: This EEG study was done with scalp electrodes positioned according to the 10-20 International system of electrode placement. Electrical activity was reviewed with band pass filter of 1-70Hz , sensitivity of 7 uV/mm, display speed of 87mm/sec with a 60Hz  notched filter applied as appropriate. EEG data were recorded continuously and digitally stored.  Video monitoring was available and reviewed as appropriate.   Description: The posterior dominant rhythm consists of 8 Hz activity of moderate voltage (25-35 uV) seen predominantly in posterior head regions, asymmetric ( right<left) and reactive to eye opening and eye closing.  Sleep was characterized by vertex waves, sleep spindles (12 to 14 Hz), maximal frontocentral region. EEG showed intermittent generalized 3 to 6 Hz theta-delta slowing. EEG also showed continuous polymorphic sharply contoured 3-6 theta- delta slowing admixed in right parietal region consistent with breach artifact. Sharp waves were noted in right parietal region at  times qasi- periodic at 1 Hz.Hyperventilation and photic stimulation were not performed.      ABNORMALITY - Sharp waves, right parietal region - Breach artifact, right parietal region - Continuous slow, right parietal region - Intermittent slow, generalized - Background asymmetry, right<left   IMPRESSION: This study showed  oevidence of epileptogenicity and cortical dysfunction arising from right hemisphere, maximal right parietal region with increased risk of seizures.  Additionally there was mild diffuse encephalopathy.No seizures were noted.   Richard Ritchey [name]

## 2023-06-23 NOTE — Plan of Care (Signed)
LTM overnight stable.   D/C LTM.  Con't keppra 1000mg  TID Con't vimpat 100mg  BID  Neurology will sign off. Call with questions.

## 2023-06-23 NOTE — Progress Notes (Signed)
   Overview: Tabitha Ramirez is a 77 y.o. person living with a history of metastatic breast cancer to the brain s/p right parietal craniotomy and tumor resection (2023), HLD, HTN, GERD, hypothyroidism, who presented to the ED on 9/26 with facial twitching and left sided weakness. She was admitted to the Internal Medicine Teaching Service on 9/27 for further neurology workup due to concerns for facial motor seizure   Overnight: NAEON  Subjective:  States doing well, no acute concerns. Asked questions regarding her cancer and what stage that was. Reports having good discussion with palliative care team yesterday.   Objective: afebrile, vitals wnl.  Vital signs in last 24 hours: Vitals:   06/22/23 1533 06/22/23 2002 06/22/23 2351 06/23/23 0315  BP: 109/60 117/71 110/74 121/76  Pulse: 94 89 74 70  Resp: 18 18 18 17   Temp: 97.8 F (36.6 C) 97.7 F (36.5 C) (!) 97.5 F (36.4 C) 97.6 F (36.4 C)  TempSrc: Oral Oral Oral Oral  SpO2: 100% 100% 100% 99%  Weight:      Height:       Supplemental O2: Room Air Last BM Date : 06/21/23 SpO2: 99 % Filed Weights   06/21/23 1254 06/22/23 0137  Weight: 59 kg 57.6 kg   No intake or output data in the 24 hours ending 06/23/23 0620 Net IO Since Admission: No IO data has been entered for this period [06/23/23 0620]  Physical Exam General: NAD, laying in bed HENT: NCAT Lungs: CTAB Cardiovascular: RRR, no LE edema  Abdomen: soft, active bowel sounds, No TTP MSK: good strength on right side, has weakness on LUE and LLE Skin: no skin lesions noted Neuro: alert and oriented x4 Psych: normal mood  Diagnostics No labs today. Lab work showed stable renal function and hgb.   Assessment/Plan: Principal Problem:   Tonic-clonic seizure (HCC) Active Problems:   Malignant neoplasm of female breast (HCC)   Facial twitching  #Hx of Breast cancer with metastasis to brain s/p resection #Left sided facial twitching  Improving without any further  seizure activity. Unfortunately cause of her seizure is recurrence of the brain mass. Dr. Barbaraann Cao wants to follow up outpatient after he discusses case on tumor board and with radiology team. Pt will benefit from continued palliative care conversations while here.  --Continue current measures including Vimpat 100 mg BID, Valproate, Decadron 4 mg BID, Keppra 1000 mg TID -Continue anastrazole 1 mg daily - Will plan on discussing goals of care with family and palliative care team    Chronic Conditions  #HLD- Continue home rosuvastatin 10 mg daily   #HTN - Continue home triamterene- HTCZ 37.5 mg-25 mg daily   #Hypothyroidism - Continue home levothyroxine 100 mcg daily   #Anemia- Continue home iron sulfate tablet 325 mg  Diet: Normal IVF: None VTE: Heparin Code: DNR PT/OT recs: SNF for Subacute PT. Prior to Admission Living Arrangement: Facility Anticipated Discharge Location: Facility Barriers to Discharge: Medical Stability Dispo: Anticipated discharge in approximately 1 day(s).   Gwenevere Abbot, MD Eligha Bridegroom. [facility] Internal Medicine Residency, PGY-3  Pager: [phone] After 5 pm and on weekends: Please call the on-call pager

## 2023-06-23 NOTE — Progress Notes (Signed)
EEG maint complete. No skin breakdown °

## 2023-06-23 NOTE — Progress Notes (Signed)
Eeg removal, no skin breakdown

## 2023-06-24 DIAGNOSIS — G40409 Other generalized epilepsy and epileptic syndromes, not intractable, without status epilepticus: Secondary | ICD-10-CM | POA: Diagnosis not present

## 2023-06-24 DIAGNOSIS — Z515 Encounter for palliative care: Secondary | ICD-10-CM | POA: Diagnosis not present

## 2023-06-24 LAB — CBC
HCT: 29.6 % — ABNORMAL LOW (ref 36.0–46.0)
Hemoglobin: 10.1 g/dL — ABNORMAL LOW (ref 12.0–15.0)
MCH: 32.4 pg (ref 26.0–34.0)
MCHC: 34.1 g/dL (ref 30.0–36.0)
MCV: 94.9 fL (ref 80.0–100.0)
Platelets: 330 10*3/uL (ref 150–400)
RBC: 3.12 MIL/uL — ABNORMAL LOW (ref 3.87–5.11)
RDW: 12.7 % (ref 11.5–15.5)
WBC: 13.4 10*3/uL — ABNORMAL HIGH (ref 4.0–10.5)
nRBC: 0 % (ref 0.0–0.2)

## 2023-06-24 LAB — COMPREHENSIVE METABOLIC PANEL
ALT: 30 U/L (ref 0–44)
AST: 28 U/L (ref 15–41)
Albumin: 3 g/dL — ABNORMAL LOW (ref 3.5–5.0)
Alkaline Phosphatase: 79 U/L (ref 38–126)
Anion gap: 18 — ABNORMAL HIGH (ref 5–15)
BUN: 47 mg/dL — ABNORMAL HIGH (ref 8–23)
CO2: 23 mmol/L (ref 22–32)
Calcium: 9.5 mg/dL (ref 8.9–10.3)
Chloride: 98 mmol/L (ref 98–111)
Creatinine, Ser: 1.21 mg/dL — ABNORMAL HIGH (ref 0.44–1.00)
GFR, Estimated: 47 mL/min — ABNORMAL LOW (ref >60)
Glucose, Bld: 162 mg/dL — ABNORMAL HIGH (ref 70–99)
Potassium: 4.9 mmol/L (ref 3.5–5.1)
Sodium: 139 mmol/L (ref 135–145)
Total Bilirubin: 0.5 mg/dL (ref 0.3–1.2)
Total Protein: 7.7 g/dL (ref 6.5–8.1)

## 2023-06-24 NOTE — Progress Notes (Signed)
Daily Progress Note   Patient Name: Tabitha Ramirez       Date: 06/24/2023 DOB: 21-Mar-1946  Age: 77 y.o. MRN#: [id number] Attending Physician: Ginnie Smart, MD Primary Care Physician: Duard Larsen Primary Care Admit Date: 06/21/2023  Reason for Consultation/Follow-up: Establishing goals of care  Length of Stay: 2  Current Medications: Scheduled Meds:   anastrozole  1 mg Oral Daily   dexamethasone  4 mg Oral Q12H   feeding supplement  237 mL Oral BID BM   ferrous sulfate  325 mg Oral Q breakfast   heparin  5,000 Units Subcutaneous Q8H   lacosamide  100 mg Oral BID   levETIRAcetam  1,000 mg Oral TID   levothyroxine  100 mcg Oral Q0600   rosuvastatin  10 mg Oral Daily   triamterene-hydrochlorothiazide  1 tablet Oral Daily    Continuous Infusions:   PRN Meds: oxyCODONE  Physical Exam Vitals reviewed.  HENT:     Head: Normocephalic and atraumatic.  Cardiovascular:     Rate and Rhythm: Normal rate.  Pulmonary:     Effort: Pulmonary effort is normal.  Neurological:     Mental Status: She is alert and oriented to person, place, and time.  Psychiatric:        Mood and Affect: Mood normal.        Behavior: Behavior normal.             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, right (HCC) 04/13/2022   Brain tumor (HCC)  03/17/2022   Palliative care encounter    Brain mass 03/09/2022   Seizure (HCC) 03/09/2022   Neuropathy 06/11/2014   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   Gait abnormality 01/26/2014   Unspecified hereditary and idiopathic peripheral neuropathy  01/26/2014   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   Neuropathic pain 01/20/2014   Anxiety 01/20/2014   Unspecified deficiency anemia 01/20/2014   Hx of neutropenia 08/04/2013   Jaw pain 08/04/2013   Leukocytosis 04/27/2013   Pericardial effusion 04/27/2013   Malignant neoplasm of female breast (HCC) 02/04/2013   Need for influenza vaccination 11/29/2011   HLD (hyperlipidemia) 03/07/2011   Post-menopausal 03/07/2011   Anemia 12/17/2010   Heart murmur 12/17/2010   Seasonal allergies 12/17/2010   HYPOKALEMIA 02/19/2009   Essential hypertension, benign 04/09/2008    Palliative Care Assessment & Plan   Patient Profile: 77 y.o. female  with past medical history of metastatic breast cancer to the brain s/p right parietal craniotomy and tumor resection (2023), HLD, HTN, GERD, hypothyroidism admitted on 06/21/2023 with facial twitching and left sided weakness.  Assessment: Follow up with patient. She is preparing to order breakfast. She has no questions or concerns from out conversation yesterday. I informed her that I had reached out to outpatient palliative provider [facility] for follow up after discharge. PMT will continue to support holistically while hospitalized.  Recommendations/Plan: DNR Limited Scope of Treatment: No intubation or feeding tube Encouraged continued discussion re: goals of care  Outpatient follow up with [facility] with outpatient palliative Continued PMT support   Extensive chart review has been completed prior to seeing the patient and speaking with her daughter [name] including labs, vital signs, imagine, progress/consult notes, orders, medications, and available advance  directive documents.  Care plan was discussed with bedside RN  Time spent: 25 minutes  Thank you for allowing the Palliative Medicine Team to assist in the care of this patient.    Sherryll Burger, NP  Please contact Palliative Medicine Team phone at [phone] for questions and concerns.

## 2023-06-24 NOTE — Progress Notes (Signed)
Overview: Tabitha Ramirez is a 77 y.o. person living with a history of metastatic breast cancer to the brain s/p right parietal craniotomy and tumor resection (2023), HLD, HTN, GERD, hypothyroidism, who presented to the ED on 9/26 with facial twitching and left sided weakness. She was admitted to the Internal Medicine Teaching Service on 9/27 for further neurology workup due to concerns for facial motor seizure.  Subjective:  No overnight events reported. Patient feeling ok this morning, sitting up in bed eating breakfast. Denies any coughing or choking with eating or drinking. Denies any CP, SOB, N/V, or abdominal pain this morning. Is endorsing chronic right sided breast pain, says it improves with PRN oxycodone. No other new acute pain reported. Patient feeling ok about news of tumor recurrence, able to state tumor staging. Recalled upcoming appointment with Dr. Barbaraann Cao to further discuss findings and plan. Patient expressed very clearly that she did not want to go back to the SNF where she was previously at. Stated her daughter, [name], has a list of potential SNFs. Reassured patient social work is working on placement and will take her preferences into consideration.   Vital signs in last 24 hours: Vitals:   06/24/23 0102 06/24/23 0310 06/24/23 0736 06/24/23 1140  BP: 120/72 121/76 114/69 108/70  Pulse: 72 72 72 82  Resp: 18 18 18 18   Temp: 98 F (36.7 C) 97.8 F (36.6 C) 98.2 F (36.8 C) 98 F (36.7 C)  TempSrc: Oral Oral Oral Oral  SpO2: 100% 99% 99% 100%  Weight:      Height:       Supplemental O2: Room Air Last BM Date : 06/21/23 SpO2: 100 % Filed Weights   06/21/23 1254 06/22/23 0137  Weight: 59 kg 57.6 kg    Intake/Output Summary (Last 24 hours) at 06/24/2023 1201 Last data filed at 06/24/2023 0326 Gross per 24 hour  Intake 480 ml  Output 575 ml  Net -95 ml   Net IO Since Admission: -295 mL [06/24/23 1201]     Latest Ref Rng & Units 06/22/2023   11:44 AM 06/21/2023    10:22 PM 05/23/2023    6:30 AM  CMP  Glucose 70 - 99 mg/dL 025  427  062   BUN 8 - 23 mg/dL 23  19  13    Creatinine 0.44 - 1.00 mg/dL 3.76  2.83  1.51   Sodium 135 - 145 mmol/L 139  136  137   Potassium 3.5 - 5.1 mmol/L 4.2  4.5  3.9   Chloride 98 - 111 mmol/L 98  100  104   CO2 22 - 32 mmol/L 23  26  25    Calcium 8.9 - 10.3 mg/dL 76.1  9.4  8.9   Total Protein 6.5 - 8.1 g/dL 8.6  7.8    Total Bilirubin 0.3 - 1.2 mg/dL 0.3  0.6    Alkaline Phos 38 - 126 U/L 73  71    AST 15 - 41 U/L 24  23    ALT 0 - 44 U/L 27  27          Latest Ref Rng & Units 06/22/2023   11:44 AM 06/21/2023   10:22 PM 05/23/2023    6:30 AM  CBC  WBC 4.0 - 10.5 K/uL 7.5  7.7  11.3   Hemoglobin 12.0 - 15.0 g/dL 60.7  9.9  37.1   Hematocrit 36.0 - 46.0 % 35.3  29.9  30.0   Platelets 150 - 400 K/uL 361  349  128      Physical Exam General: resting in bed in no acute distress; continues to have some hoarseness in voice but is otherwise answering questions appropriately using sentences Lungs: clear lungs bilaterally, normal respiratory effort on room air Cardiovascular: RRR, no LE edema  Abdomen: soft, non-tender, non-distended; positive bowel sounds  MSK: continues to have some weakness on left upper extremity compared to the right, intact sensation and pulses Skin: no skin lesions noted Neuro: alert and oriented to self, place, and situation; left sided facial twitching minimal/improved Psych: normal mood and affect  Assessment/Plan: Principal Problem:   Tonic-clonic seizure (HCC) Active Problems:   Malignant neoplasm of female breast (HCC)   Facial twitching  #Hx of Breast cancer with metastasis to brain s/p resection #Left sided facial twitching  Improvement in facial twitching. Per neurology, EEG showed subclinical seizure 1x 06/22/23 at 1421 lasting about 3 minutes. Otherwise patient's EEG showing evidence of epileptogenecity and cortical dysfunction arising from right hemisphere, maximal right  parietal region with increased risk of seizures. There was mild diffuse encephalopathy. No other new seizures noted. Neurology did not indicate a need to adjust patient's seizure medication regimen.   Cause of seizure is likely recurrence of the brain mass. Dr. Barbaraann Cao wants to follow up outpatient after he discusses case on tumor board and with radiology team. Visit may need to be virtual if patient still here awaiting SNF placement. Pt will benefit from continued palliative care conversations while here.  - Continue current measures including Vimpat 100 mg BID, Valproate, Decadron 4 mg BID, Keppra 1000 mg TID - Continue anastrazole 1 mg daily - Will plan on discussing goals of care with family and palliative care team  #Right breast pain Patient with history of partial right mastectomy (2015), right breast lumpectomy (2015). Per chart review, patient has had 10 fractions to her right breast. Has history of occasional sharp pains in the right breast, has taken 2 norco per day for pain in the past. Currently endorsing intermittent pain, similar to what she has experienced before, believes PRN oxy works to relieve pain, will let team know if pain continues or needs additional pain management.  Plan:  Oxy 5 mg q12H PRN     Chronic Conditions  #HLD- Continue home rosuvastatin 10 mg daily   #HTN - Continue home triamterene- HTCZ 37.5 mg-25 mg daily   #Hypothyroidism - Continue home levothyroxine 100 mcg daily   #Anemia- Continue home iron sulfate tablet 325 mg  Diet: Normal IVF: None VTE: Heparin Code: DNR PT/OT recs: SNF for Subacute PT. Prior to Admission Living Arrangement: Facility Anticipated Discharge Location: Facility Barriers to Discharge: Medical Stability Dispo: Anticipated discharge in approximately 1 day(s).   Gwenevere Abbot, MD Eligha Bridegroom. [facility] Internal Medicine Residency, PGY-3  Pager: [phone] After 5 pm and on weekends: Please call the on-call pager

## 2023-06-24 NOTE — TOC Progression Note (Signed)
Transition of Care [facility]) - Progression Note    Patient Details  Name: Tabitha Ramirez MRN: [id number] Date of Birth: [birth year]  Transition of Care [facility]) CM/SW Contact  Dellie Burns [place], [place] Phone Number: 06/24/2023, 9:09 AM  Clinical Narrative: spoke to pt's dtr [name] re current SNF offers ([facility] and [facility]). Dtr states they do not want pt to go to [facility] again and prefers [facility], [address], or [facility]. Will f/u Monday with preferred facilities to obtain a response and provide updated offers to dtr.   Dellie Burns, MSW, LCSW [phone] (coverage)        Expected Discharge Plan: Skilled Nursing Facility Barriers to Discharge: Continued Medical Work up, English as a second language teacher  Expected Discharge Plan and Services     Post Acute Care Choice: Skilled Nursing Facility Living arrangements for the past 2 months: Single Family Home                                       Social Determinants of Health (SDOH) Interventions SDOH Screenings   Food Insecurity: No Food Insecurity (06/22/2023)  Housing: Low Risk  (06/22/2023)  Transportation Needs: No Transportation Needs (06/22/2023)  Utilities: Not At Risk (06/22/2023)  Depression (PHQ2-9): Low Risk  (12/27/2022)  Tobacco Use: Medium Risk (06/21/2023)    Readmission Risk Interventions     No data to display

## 2023-06-25 ENCOUNTER — Inpatient Hospital Stay (HOSPITAL_COMMUNITY): Payer: Medicare PPO

## 2023-06-25 ENCOUNTER — Inpatient Hospital Stay (HOSPITAL_BASED_OUTPATIENT_CLINIC_OR_DEPARTMENT_OTHER): Payer: Medicare PPO | Admitting: Internal Medicine

## 2023-06-25 ENCOUNTER — Inpatient Hospital Stay: Payer: Medicare PPO | Attending: Hematology and Oncology

## 2023-06-25 DIAGNOSIS — C50911 Malignant neoplasm of unspecified site of right female breast: Secondary | ICD-10-CM

## 2023-06-25 DIAGNOSIS — R569 Unspecified convulsions: Secondary | ICD-10-CM | POA: Diagnosis not present

## 2023-06-25 DIAGNOSIS — C7931 Secondary malignant neoplasm of brain: Secondary | ICD-10-CM

## 2023-06-25 LAB — BASIC METABOLIC PANEL
Anion gap: 12 (ref 5–15)
BUN: 41 mg/dL — ABNORMAL HIGH (ref 8–23)
CO2: 23 mmol/L (ref 22–32)
Calcium: 9.4 mg/dL (ref 8.9–10.3)
Chloride: 100 mmol/L (ref 98–111)
Creatinine, Ser: 1.06 mg/dL — ABNORMAL HIGH (ref 0.44–1.00)
GFR, Estimated: 55 mL/min — ABNORMAL LOW (ref >60)
Glucose, Bld: 132 mg/dL — ABNORMAL HIGH (ref 70–99)
Potassium: 4.4 mmol/L (ref 3.5–5.1)
Sodium: 135 mmol/L (ref 135–145)

## 2023-06-25 LAB — CBC
HCT: 28 % — ABNORMAL LOW (ref 36.0–46.0)
Hemoglobin: 9.3 g/dL — ABNORMAL LOW (ref 12.0–15.0)
MCH: 32.5 pg (ref 26.0–34.0)
MCHC: 33.2 g/dL (ref 30.0–36.0)
MCV: 97.9 fL (ref 80.0–100.0)
Platelets: 395 10*3/uL (ref 150–400)
RBC: 2.86 MIL/uL — ABNORMAL LOW (ref 3.87–5.11)
RDW: 12.9 % (ref 11.5–15.5)
WBC: 13.5 10*3/uL — ABNORMAL HIGH (ref 4.0–10.5)
nRBC: 0 % (ref 0.0–0.2)

## 2023-06-25 MED ORDER — IOHEXOL 9 MG/ML PO SOLN
500.0000 mL | ORAL | Status: AC
  Administered 2023-06-25: 500 mL via ORAL

## 2023-06-25 MED ORDER — OXYCODONE HCL 5 MG PO TABS: 5.0000 mg | ORAL_TABLET | Freq: Four times a day (QID) | ORAL | Status: DC | PRN

## 2023-06-25 MED ORDER — INFLUENZA VIRUS VACC SPLIT PF (FLUZONE) 0.5 ML IM SUSY
0.5000 mL | PREFILLED_SYRINGE | INTRAMUSCULAR | Status: AC
  Administered 2023-06-26: 0.5 mL via INTRAMUSCULAR
  Filled 2023-06-25: qty 0.5

## 2023-06-25 MED ORDER — IOHEXOL 350 MG/ML SOLN
75.0000 mL | Freq: Once | INTRAVENOUS | Status: AC | PRN
  Administered 2023-06-25: 75 mL via INTRAVENOUS

## 2023-06-25 NOTE — Progress Notes (Signed)
I connected with [name] on 06/25/23 at 11:00 AM EDT by telephone visit and verified that I am speaking with the correct person using two identifiers.   I discussed the limitations, risks, security and privacy concerns of performing an evaluation and management service by telemedicine and the availability of in-person appointments. I also discussed with the patient that there may be a patient responsible charge related to this service. The patient expressed understanding and agreed to proceed.   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  Left arm and hand remain weak, but she is able to lift against gravity.  She has a transition to rehab in the works.  Observations: Language and cognition at baseline  Imaging:  [facility] Clinician Interpretation: I have personally reviewed the CNS images as listed.  My interpretation, in the context of the patient's clinical presentation, is progressive disease  Overnight EEG with video  Result Date: 06/23/2023 [name], MD     06/23/2023  9:42 AM Patient Name: Tabitha Ramirez MRN: [id number] Epilepsy Attending: [name] Referring Physician/Provider: Erick Blinks, MD  Duration: 06/22/2023 0450 to 06/23/2023 0450  Patient history: 77 y.o. 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 EEG to evaluate for seizure.  Level of alertness: Awake, asleep  AEDs during EEG study: LEV, LCM, VPA  Technical aspects: This EEG study was done with scalp electrodes  positioned according to the 10-20 International system of electrode placement. Electrical activity was reviewed with band pass filter of 1-70Hz , sensitivity of 7 uV/mm, display speed of 80mm/sec with a 60Hz  notched filter applied as appropriate. EEG data were recorded continuously and digitally stored.  Video monitoring was available and reviewed as appropriate.  Description: The posterior dominant rhythm consists of 8 Hz activity of moderate voltage (25-35 uV) seen predominantly in posterior head regions, asymmetric ( right<left) and reactive to eye opening and eye closing.  Sleep was characterized by vertex waves, sleep spindles (12 to 14 Hz), maximal frontocentral region. EEG showed intermittent generalized 3 to 6 Hz theta-delta slowing. EEG also showed continuous polymorphic sharply contoured 3-6 theta- delta slowing admixed in right parietal region consistent with breach artifact. Sharp waves were noted in right parietal region at  times qasi- periodic at 1 Hz. One seizure without clinical signs was noted on 06/22/2023 at 1421. EEG showed sharp waves in right parietal region at 2.5 hz which gradually evolved to 4-5Hz  admixed with rhythmic 3-6hz  theta-delta slowing. EEG then evolved in morphology and appeared more sharply contoured. Duration of seizure was about 3 minutes.  Hyperventilation and photic stimulation were not performed.    ABNORMALITY - Seizure without clinical signs, right parietal region - Sharp waves, right parietal region - Breach artifact, right parietal region - Continuous slow, right parietal region - Intermittent slow, generalized - Background asymmetry, right<left IMPRESSION: This study showed one seizure without clinical signs on 06/22/2023 at 1421, arising from right parietal region, lasting about 3 minutes. Additionally there is evidence of cortical dysfunction arising from right parietal region consistent with underlying craniotomy. Lastly there  was mild diffuse encephalopathy.  [name]   CT Head Wo Contrast  Result Date: 06/22/2023 CLINICAL DATA:  Status post seizure. EXAM: CT HEAD WITHOUT CONTRAST TECHNIQUE: Contiguous axial images were obtained from the base of the skull through the vertex without intravenous contrast. RADIATION DOSE REDUCTION: This exam was performed according to the departmental dose-optimization program which includes automated exposure control, adjustment of the mA and/or kV according to patient size and/or use of iterative reconstruction technique. COMPARISON:  May 21, 2023 FINDINGS: Brain: There is mild cerebral atrophy with widening of the extra-axial spaces and ventricular dilatation. There are areas of decreased attenuation within the white matter tracts of the supratentorial brain, consistent with microvascular disease changes. A 1.9 cm x 1.7 cm x 2.2 cm well-defined area of encephalomalacia (approximately 18.98 Hounsfield units) is seen within the posterior parietal region on the right. A mild-to-moderate amount of surrounding white matter low attenuation is seen. Adjacent 3.9 cm x 1.4 cm x 1.3 cm isodense area is seen along the posterior parietal region on the right (approximately 47.88 Hounsfield units). This extends along the right parietal lobe resection cavity and corresponds to findings seen within this region on the most recent MR head (June 20, 2023). Vascular: There is marked severity calcification of the bilateral cavernous carotid arteries. Skull: A right posterior parietal craniotomy defect is seen. Sinuses/Orbits: Mild right maxillary sinus mucosal thickening is seen. Other: None. IMPRESSION: 1. Right posterior parietal craniotomy defect with a right posterior parietal lobe resection cavity. 2. Isodense area along the right posterior parietal dissection cavity which likely corresponds to findings seen within this region on the most recent MR head ( June 20, 2023) and is concerning for the presence of recurrent tumor. 3.  Generalized cerebral atrophy with microvascular disease changes of the supratentorial brain. Electronically Signed   By: Aram Candela M.D.   On: 06/22/2023 01:23   MR Brain W Wo Contrast  Result Date: 06/21/2023 CLINICAL DATA:  Provided history: Metastasis to brain. Brain metastases, assess treatment response. EXAM: MRI HEAD WITHOUT AND WITH CONTRAST TECHNIQUE: Multiplanar, multiecho pulse sequences of the brain and surrounding structures were obtained without and with intravenous contrast. CONTRAST:  7 mL Vueway intravenous contrast. COMPARISON:  Head CT 05/21/2023. Prior brain MRI examinations 04/04/2023 and earlier. FINDINGS: Brain: Postoperative sequelae from prior right parietal craniotomy resection. New from the brain MRI of 04/04/2023, there is recurrent enhancing tumor along significant portions of the right parietal lobe, partially abutting the right parietal lobe resection cavity and involving the overlying dura. This measures 5.9 x 6.8 x 2.1 cm. Enhancement elsewhere along the right parietal lobe resection cavity has also increased in conspicuity. Most notably, there are now two nodular foci of enhancement at the anterior aspect of the resection cavity measuring up to 7 mm (for instance as seen on series 14, images 104-106). Extensive surrounding T2 FLAIR hyperintense signal abnormality within the posterior right cerebral hemisphere has progressed, and likely reflects a combination of edema and post-treatment changes. No new sites of intracranial metastatic disease elsewhere. Multifocal T2 FLAIR hyperintense signal abnormality elsewhere within the cerebral white matter and pons, overall moderate in severity. Findings are nonspecific, but most often secondary to chronic small vessel ischemia. Chronic hemosiderin deposition again demonstrated along the margins of the right parietal lobe resection cavity and along the adjacent right parietal lobe. There is no acute infarct. No extra-axial fluid  collection. No midline shift. Vascular: Maintained flow voids within the proximal large arterial vessels. Skull and upper  cervical spine: Right parietal cranioplasty. No focal suspicious marrow lesion. Incompletely assessed cervical spondylosis. Sinuses/Orbits: No mass or acute finding within the imaged orbits. Small fluid level within the right maxillary sinus. Impression #1 will be called to the ordering clinician or representative by the Radiologist Assistant, and communication documented in the PACS or [employer]. IMPRESSION: 1. New from the prior brain MRI of 04/04/2023, there is recurrent enhancing tumor along significant portions of the right parietal lobe (partially abutting the right parietal lobe resection cavity and involving the overlying dura). This measures 5.9 x 6.8 x 2.1 cm. Enhancement elsewhere along the right parietal lobe resection cavity has also increased in conspicuity. Most notably, there are now two nodular foci of enhancement at the anterior aspect of the resection cavity measuring up to 7 mm. This is also suspicious for recurrent tumor. Extensive surrounding T2 FLAIR hyperintense signal abnormality within the posterior right cerebral hemisphere has increased and likely reflects a combination of edema and post-treatment changes. No midline shift. 2. No new sites of intracranial metastatic disease elsewhere. Electronically Signed   By: Jackey Loge D.O.   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  Case was discussed in CNS tumor board this AM.  Recommended restaging with body CTs given lack of recent imaging and follow up with medical oncology.  Because tumor burden is mostly dural based, on body side of blood-brain barrier, this could be amenable to conventional chemotherapy or systemic therapy, if  utilized.  Ohterwise, would recommend WBRT with boost to local disease, as discussed with Dr. Kathrynn Running and his team.  This might be a reasonable approach regardless, given how quickly tumor has grown along dura in the past 2 months.   Follow Up Instructions: TBD based on body CT's and patient preference regarding treatment plans discussed today.  I discussed the assessment and treatment plan with the patient.  The patient was provided an opportunity to ask questions and all were answered.  The patient agreed with the plan and demonstrated understanding of the instructions.    The patient was advised to call back or seek an in-person evaluation if the symptoms worsen or if the condition fails to improve as anticipated.    Henreitta Leber, MD   I provided 23 minutes of non face-to-face telephone visit time during this encounter, and > 50% was spent counseling as documented under my assessment & plan.

## 2023-06-25 NOTE — TOC Progression Note (Signed)
Transition of Care [facility]) - Progression Note    Patient Details  Name: Tabitha Ramirez MRN: [id number] Date of Birth: 11-07-1945  Transition of Care [facility]) CM/SW Contact  Baldemar [place], [place] Phone Number: 06/25/2023, 3:30 PM  Clinical Narrative:   CSW reached out to [place], [place], and [facility] to have them review referral. All reviewed and can offer but have different requirements for copayments. CSW met with daughter in law, [name], to update on offers: [facility] would need 2 weeks of copays up front, and [facility] would need 30 days up front. [facility] was still reviewing with business office. [name] indicated that when she called [employer] the copay amount was different than what [facility] is reporting. CSW had [employer] again to verify, CSW was placed on a 3 way call. Patient's copayment for SNF is only $50 a day, and she has $2056 left of her out of pocket maximum. Patient's first choice is [facility], so CSW spoke with [facility] and [facility] and they do not require copayments up front, they will bill the patient per month. CSW met with patient and daughter in law, they are in agreement with [facility]. CSW to initiate insurance authorization once updated PT note is in.    Expected Discharge Plan: Skilled Nursing Facility Barriers to Discharge: Continued Medical Work up, English as a second language teacher  Expected Discharge Plan and Services     Post Acute Care Choice: Skilled Nursing Facility Living arrangements for the past 2 months: Single Family Home                                       Social Determinants of Health (SDOH) Interventions SDOH Screenings   Food Insecurity: No Food Insecurity (06/22/2023)  Housing: Low Risk  (06/22/2023)  Transportation Needs: No Transportation Needs (06/22/2023)  Utilities: Not At Risk (06/22/2023)  Depression (PHQ2-9): Low Risk  (12/27/2022)  Tobacco Use: Medium Risk (06/21/2023)    Readmission Risk Interventions     No data to  display

## 2023-06-25 NOTE — Care Management Important Message (Signed)
Important Message  Patient Details  Name: Tabitha Ramirez MRN: [id number] Date of Birth: 20-Dec-1945   Important Message Given:  Yes - Medicare IM     Dorena Bodo 06/25/2023, 3:17 PM

## 2023-06-25 NOTE — Progress Notes (Signed)
Physical Therapy Treatment Patient Details Name: Tabitha Ramirez MRN: [id number] DOB: [birth year] Today's Date: 06/25/2023   History of Present Illness Pt is a 77 y/o female admitted from SNF rehab 9/26 due to seizure activity concerns. LTM EEG with one seizure activity 9/27. 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  She was able to progress to standing and walked 25' with RW and mod A.  Pt does required mod cues for sequencing and attention to L side.  Also, mod A for all transfers and ambulation.  Tolerated well.  Continue to progress as able and recommend Patient will benefit from continued inpatient follow up therapy, <3 hours/day at d/c   If plan is discharge home, recommend the following: A lot of help with bathing/dressing/bathroom;Assistance with cooking/housework;Direct supervision/assist for medications management;Direct supervision/assist for financial management;Assist for transportation;Supervision due to cognitive status;Help with stairs or ramp for entrance;A lot of help with walking and/or transfers   Can travel by private vehicle     No  Equipment Recommendations  None recommended by PT    Recommendations for Other Services       Precautions / Restrictions Precautions Precautions: Fall Precaution Comments: seizures     Mobility  Bed Mobility Overal bed mobility: Needs Assistance Bed Mobility: Supine to Sit, Sit to Supine     Supine to sit: Mod assist Sit to supine: Min assist   General bed mobility comments: Mod A to lift trunk and scoot forward with mod cues for sequencing to sit.  For return to bed, min A for legs.    Transfers Overall transfer level: Needs assistance Equipment used: Rolling walker (2 wheels) Transfers: Sit to/from Stand Sit to Stand: Mod assist   Step pivot transfers: Mod assist        General transfer comment: STS x 5 during session with cues for hand placement and to monitor L side; requiring mod A to rise, assist to maintain L hand on RW, and cues to tuck buttock and stand straight    Ambulation/Gait Ambulation/Gait assistance: Mod assist Gait Distance (Feet): 25 Feet Assistive device: Rolling walker (2 wheels) Gait Pattern/deviations: Step-to pattern, Decreased stride length, Trunk flexed Gait velocity: decreased     General Gait Details: Cues for posture and staying close to RW with facilitation for standing straight, keeping hand on RW, and turning walker.  Fatigued easily.   Stairs             Wheelchair Mobility     Tilt Bed    Modified Rankin (Stroke Patients Only)       Balance Overall balance assessment: Needs assistance Sitting-balance support: No upper extremity supported Sitting balance-Leahy Scale: Fair Sitting balance - Comments: Could balance without UE support but does tend to lean L at times, could correct with cues but would drift back.  Worked on leaning R, midline, and correcting from L 10 times with visual cues.  Also worked on leaning back then correcting x 10     Standing balance-Leahy Scale: Poor Standing balance comment: RW and min-mod A                            Cognition Arousal: Alert Behavior During Therapy: Flat affect Overall Cognitive Status: Impaired/Different from baseline Area of Impairment: Awareness, Safety/judgement, Problem solving, Attention, Following commands  Orientation Level: Disoriented to, Situation Current Attention Level: Sustained   Following Commands: Follows one step commands with increased time   Awareness: Emergent Problem Solving: Slow processing, Difficulty sequencing, Requires verbal cues General Comments: Requring cues for attention to L side; needs cues for sequencing        Exercises General Exercises - Lower Extremity Ankle Circles/Pumps:  AROM, Both, 10 reps, Seated Long Arc Quad: AROM, Both, 10 reps, Seated Hip Flexion/Marching: AROM, Both, 10 reps, Seated Other Exercises Other Exercises: Worked on grabbing target (travel size shampoo bottle) x 20 in different locations with L hand from seated position.  Aimed to simulate trying to reach and grab RW handle    General Comments General comments (skin integrity, edema, etc.): Assisted pt with toielting ADLs (pt urinated during session).  VSS      Pertinent Vitals/Pain Pain Assessment Pain Assessment: No/denies pain    Home Living                          Prior Function            PT Goals (current goals can now be found in the care plan section) Progress towards PT goals: Progressing toward goals    Frequency    Min 1X/week      PT Plan      Co-evaluation              AM-PAC PT "6 Clicks" Mobility   Outcome Measure  Help needed turning from your back to your side while in a flat bed without using bedrails?: A Little Help needed moving from lying on your back to sitting on the side of a flat bed without using bedrails?: A Lot Help needed moving to and from a bed to a chair (including a wheelchair)?: A Lot Help needed standing up from a chair using your arms (e.g., wheelchair or bedside chair)?: A Lot Help needed to walk in hospital room?: A Lot Help needed climbing 3-5 steps with a railing? : Total 6 Click Score: 12    End of Session Equipment Utilized During Treatment: Gait belt Activity Tolerance: Patient tolerated treatment well Patient left: in bed;with call bell/phone within reach;with bed alarm set Nurse Communication: Mobility status PT Visit Diagnosis: Unsteadiness on feet (R26.81);Muscle weakness (generalized) (M62.81);Difficulty in walking, not elsewhere classified (R26.2);Other symptoms and signs involving the nervous system (R29.898)     Time: 1457-1520 PT Time Calculation (min) (ACUTE ONLY): 23 min  Charges:     $Gait Training: 8-22 mins $Neuromuscular Re-education: 8-22 mins PT General Charges $$ ACUTE PT VISIT: 1 Visit                     Anise Salvo, PT Acute Rehab [facility] [phone]    Rayetta Humphrey 06/25/2023, 3:52 PM

## 2023-06-25 NOTE — Progress Notes (Signed)
This chaplain responded to PMT NP-Dawn consult for creating/updating the Pt. Advance Directive: HCPOA. The Pt. did not complete a Living Will.  The Pt. daughter-Tabitha Ramirez is at the bedside.  The Pt. participated in AD education and answered clarifying questions before requesting a notary presence.  The chaplain is present with the Pt., Pt. daughter, notary, and witnesses for the notarizing of the Pt. HCPOA.  The Pt. named Tabitha Ramirez as her healthcare agent. If this person is unable or unwilling to serve in this role the Pt. next choice is Tabitha Ramirez.  The chaplain gave the Pt. the original AD along with two copies. The chaplain scanned the Pt. AD into the Pt. EMR.  This chaplain is available for F/U spiritual care as needed.  Chaplain Stephanie Acre [phone]

## 2023-06-25 NOTE — Progress Notes (Signed)
Overview: Tabitha Ramirez is a 77 y.o. person living with a history of metastatic breast cancer to the brain s/p right parietal craniotomy and tumor resection (2023), HLD, HTN, GERD, hypothyroidism, who presented to the ED on 9/26 with facial twitching and left sided weakness. She was admitted to the Internal Medicine Teaching Service on 9/27 for further neurology workup due to concerns for facial motor seizure.  Subjective:  [name] Tabitha Ramirez at bedside. Patient sitting up in bed, endorses the same right breast pain as before which is somewhat improved with PRN pain meds. Left facial twitching improving, patient noting face feels less tight. Denies any CP, SOB, palpitations, or other acute pain. Has been trying to eat and drink throughout the day, denies nausea or vomiting. Voiding fine, no issues with BM.   Vital signs in last 24 hours: Vitals:   06/24/23 1555 06/24/23 1933 06/25/23 0411 06/25/23 0807  BP: 123/72 129/68 130/76 120/73  Pulse: 76 76 68 70  Resp: 18 18 18 16   Temp: 98.1 F (36.7 C) 98.7 F (37.1 C) 98.1 F (36.7 C) 97.8 F (36.6 C)  TempSrc: Oral Oral Oral Oral  SpO2: 100% 100% 100% 100%  Weight:      Height:       Supplemental O2: Room Air Last BM Date : 06/21/23 SpO2: 100 % Filed Weights   06/21/23 1254 06/22/23 0137  Weight: 59 kg 57.6 kg    Intake/Output Summary (Last 24 hours) at 06/25/2023 0820 Last data filed at 06/25/2023 0517 Gross per 24 hour  Intake 1320 ml  Output 1300 ml  Net 20 ml   Net IO Since Admission: -275 mL [06/25/23 0820]     Latest Ref Rng & Units 06/24/2023    4:34 PM 06/22/2023   11:44 AM 06/21/2023   10:22 PM  CMP  Glucose 70 - 99 mg/dL 295  621  308   BUN 8 - 23 mg/dL 47  23  19   Creatinine 0.44 - 1.00 mg/dL 6.57  8.46  9.62   Sodium 135 - 145 mmol/L 139  139  136   Potassium 3.5 - 5.1 mmol/L 4.9  4.2  4.5   Chloride 98 - 111 mmol/L 98  98  100   CO2 22 - 32 mmol/L 23  23  26    Calcium 8.9 - 10.3 mg/dL 9.5  95.2   9.4   Total Protein 6.5 - 8.1 g/dL 7.7  8.6  7.8   Total Bilirubin 0.3 - 1.2 mg/dL 0.5  0.3  0.6   Alkaline Phos 38 - 126 U/L 79  73  71   AST 15 - 41 U/L 28  24  23    ALT 0 - 44 U/L 30  27  27          Latest Ref Rng & Units 06/24/2023    4:34 PM 06/22/2023   11:44 AM 06/21/2023   10:22 PM  CBC  WBC 4.0 - 10.5 K/uL 13.4  7.5  7.7   Hemoglobin 12.0 - 15.0 g/dL 84.1  32.4  9.9   Hematocrit 36.0 - 46.0 % 29.6  35.3  29.9   Platelets 150 - 400 K/uL 330  361  349     Physical Exam General: patient sitting up comfortably in her chair in no acute distress, speaking in full sentences, voice remains hoarse Lungs: normal respiratory effort on room air  Cardiovascular: RRR, no r/b/g, no LE edema  Abdomen: soft, non-tender, non-distended MSK: strength in U  left and right extremities unchanged from yesterday, weaker on L > R.  Neuro: alert and oriented, minimal left sided facial twitching Psych: normal mood and affect  Assessment/Plan: Principal Problem:   Tonic-clonic seizure (HCC) Active Problems:   Malignant neoplasm of female breast (HCC)   Facial twitching  #Hx of Breast cancer with metastasis to brain s/p resection #Left sided facial twitching  Patient's left sided facial twitching continues to improve, no further episodes of seizure. One episode of subclinical seizure on 9/27, remained stable on EEG all other days. Neurology ok to remain on current medication regimen.  Patient seen by Dr. Barbaraann Cao on telehealth visit with patient's daughter. Reviewed discussion with tumor board and Dr. Kathrynn Running regarding possible WBRT. Per Dr. Barbaraann Cao and Dr. Pamelia Hoit, recommend getting whole body CT while patient is admitted to help determine treatment plan. - Ordered CT chest, abdomen, pelvis w contrast  - Continue current measures including Vimpat 100 mg BID, Decadron 4 mg BID, Keppra 1000 mg TID; IV valproate completed 9/27 - Continue anastrazole 1 mg daily - SW working on SNF placement   #Right  breast pain Patient with history of partial right mastectomy (2015), right breast lumpectomy (2015). Some improvement with oxy 5 mg, will increase frequency.  Plan:  - Modify Oxy 5 mg to q6H PRN   #Elevated Cr Cr 1.21 today. Will monitor on CMP. Patient encouraged to increase PO intake, will provide fluids as needed.   Chronic Conditions  #HLD- Continue home rosuvastatin 10 mg daily   #HTN - Continue home triamterene- HTCZ 37.5 mg-25 mg daily   #Hypothyroidism - Continue home levothyroxine 100 mcg daily   #Anemia- Continue home iron sulfate tablet 325 mg  Diet: Normal IVF: None VTE: Heparin Code: DNR PT/OT recs: SNF for Subacute PT. Prior to Admission Living Arrangement: Facility Anticipated Discharge Location: Facility Barriers to Discharge: Medical Stability Dispo: Anticipated discharge in approximately 1 day(s).   Tabitha Loeffler Colbert Coyer, MD Eligha Bridegroom. [facility] [place] [facility], PGY-1 After 5 pm and on weekends: Please call the on-call pager

## 2023-06-26 ENCOUNTER — Telehealth: Payer: Self-pay

## 2023-06-26 DIAGNOSIS — G40409 Other generalized epilepsy and epileptic syndromes, not intractable, without status epilepticus: Secondary | ICD-10-CM | POA: Diagnosis not present

## 2023-06-26 LAB — BASIC METABOLIC PANEL
Anion gap: 14 (ref 5–15)
BUN: 38 mg/dL — ABNORMAL HIGH (ref 8–23)
CO2: 23 mmol/L (ref 22–32)
Calcium: 9.5 mg/dL (ref 8.9–10.3)
Chloride: 97 mmol/L — ABNORMAL LOW (ref 98–111)
Creatinine, Ser: 0.91 mg/dL (ref 0.44–1.00)
GFR, Estimated: >60 mL/min (ref >60)
Glucose, Bld: 138 mg/dL — ABNORMAL HIGH (ref 70–99)
Potassium: 4.6 mmol/L (ref 3.5–5.1)
Sodium: 134 mmol/L — ABNORMAL LOW (ref 135–145)

## 2023-06-26 LAB — CBC
HCT: 31.1 % — ABNORMAL LOW (ref 36.0–46.0)
Hemoglobin: 10.4 g/dL — ABNORMAL LOW (ref 12.0–15.0)
MCH: 31.8 pg (ref 26.0–34.0)
MCHC: 33.4 g/dL (ref 30.0–36.0)
MCV: 95.1 fL (ref 80.0–100.0)
Platelets: 400 10*3/uL (ref 150–400)
RBC: 3.27 MIL/uL — ABNORMAL LOW (ref 3.87–5.11)
RDW: 12.8 % (ref 11.5–15.5)
WBC: 14.9 10*3/uL — ABNORMAL HIGH (ref 4.0–10.5)
nRBC: 0.2 % (ref 0.0–0.2)

## 2023-06-26 MED ORDER — OXYCODONE-ACETAMINOPHEN 5-325 MG PO TABS
1.0000 | ORAL_TABLET | Freq: Four times a day (QID) | ORAL | 0 refills | Status: AC | PRN
Start: 2023-06-26 — End: 2023-07-01

## 2023-06-26 MED ORDER — LACOSAMIDE 100 MG PO TABS: 100.0000 mg | ORAL_TABLET | Freq: Two times a day (BID) | ORAL | 0 refills | Status: DC

## 2023-06-26 MED ORDER — LEVETIRACETAM 1000 MG PO TABS: 1000.0000 mg | ORAL_TABLET | Freq: Three times a day (TID) | ORAL | Status: DC

## 2023-06-26 MED ORDER — DEXAMETHASONE 4 MG PO TABS: 4.0000 mg | ORAL_TABLET | Freq: Every day | ORAL | Status: DC

## 2023-06-26 MED ORDER — RIVAROXABAN 10 MG PO TABS: 10.0000 mg | ORAL_TABLET | Freq: Every day | ORAL | Status: DC

## 2023-06-26 MED ORDER — ENSURE ENLIVE PO LIQD: 237.0000 mL | Freq: Two times a day (BID) | ORAL | Status: DC

## 2023-06-26 MED ORDER — INFLUENZA VIRUS VACC SPLIT PF (FLUZONE) 0.5 ML IM SUSY: 0.5000 mL | PREFILLED_SYRINGE | INTRAMUSCULAR | 0 refills | Status: AC

## 2023-06-26 NOTE — Discharge Summary (Addendum)
Name: Tabitha Ramirez MRN: [id number] DOB: [birth year] 77 y.o. 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        Medication List     STOP taking these medications    UNABLE TO FIND       TAKE these medications    acetaminophen 325 MG tablet Commonly known as: TYLENOL Take 1-2 tablets (325-650 mg total) by mouth every 6 (six) hours as needed for mild pain. What changed: how much to take   anastrozole 1 MG tablet Commonly known as: ARIMIDEX TAKE 1 TABLET(1 MG) BY MOUTH DAILY   D3-1000 25 MCG (1000 UT) capsule Generic drug: Cholecalciferol Take 1 capsule (1,000 Units total) by mouth daily.   dexamethasone 4 MG tablet Commonly known as: DECADRON Take 1 tablet (4 mg total) by mouth daily. Follow up with Dr. Barbaraann Cao to get refills.   feeding supplement Liqd Take 237 mLs by mouth 2 (two) times daily between meals.   ferrous sulfate 325 (65 FE) MG tablet Take 325 mg by mouth daily.   influenza vac split trivalent PF 0.5 ML injection Commonly known as: FLULAVAL Inject 0.5 mLs into the muscle tomorrow at 10 am for 1 dose.   Lacosamide 100 MG Tabs Take 1 tablet (100 mg total) by mouth 2 (two) times daily. What changed:  medication strength how much to take   levETIRAcetam 1000 MG tablet Commonly known as: KEPPRA Take 1 tablet (1,000 mg total) by mouth 3 (three) times daily. What changed: when to take this   levothyroxine 100 MCG tablet Commonly known  as: SYNTHROID Take 100 mcg by mouth daily before breakfast.   LORazepam 2 MG/ML concentrated solution Commonly known as: ATIVAN Take 0.5 mg by mouth See admin instructions. Inject 0.5 mg every 30 minutes as needed for seizure activity   LORazepam 0.5 MG tablet Commonly known as: ATIVAN Take 1 tablet (0.5 mg total) by mouth daily as needed for seizure.   oxyCODONE-acetaminophen 5-325 MG tablet Commonly known as: PERCOCET/ROXICET Take 1 tablet by mouth every 6 (six) hours as needed for severe pain.   potassium chloride 10 MEQ tablet Commonly known as: KLOR-CON Take 10 mEq by mouth daily.   rivaroxaban 10 MG Tabs tablet Commonly known as: XARELTO Take 1 tablet (10 mg total) by mouth daily.   rosuvastatin 10 MG tablet Commonly known as: CRESTOR Take 1 tablet (10 mg total) by mouth daily. What changed: when to take this   triamterene-hydrochlorothiazide 37.5-25 MG tablet Commonly known as: MAXZIDE-25 Take 1 tablet by mouth daily.   vitamin B-12 500 MCG tablet Commonly known as: CYANOCOBALAMIN Take 500 mcg by mouth daily.        Disposition and follow-up:   Ms.Tabitha Ramirez was discharged from [facility] in Stable condition.  At the hospital follow up visit please address:  1.   Tumor recurrence, seizure medication regimen  - Patient discharged on regimen that includes an increase to her Keppra regimen and the addition of decadron 4 mg daily.  Please follow up with Dr. Barbaraann Cao and Dr. Pamelia Hoit to help determine if any adjustments are needed. Patient's Rx for 30 day supply.   CT CAP 9/30 - CT of chest, abdomen, pelvis obtained at the request of patient's oncology/neurology team to help with staging. Per CT results, new subpleural nodular opacity of the left lower lobe that may be pulmonary nodule vs focal atelectasis, with radiology recommending short-term follow-up chest CT due to  in 3 months to ensure resolution. Patient may need additional imaging, please  follow up with patient's team, which includes Dr. Barbaraann Cao, Dr. Kathrynn Running, and Dr. Pamelia Hoit.   Chronic anemia  - Patient discharged with Hgb of 10.4, baseline may be 9-10. No over bleeding on exam. Continued patient on home iron sulfate tablet. Please follow up on CBC.   Monitor electrolytes, Cr on BMP  - Patient's Na 134-139 during admission, K 4.2-4.6. Creatinine ranged from 0.91 to 1.21. Please continue to monitor on BMP.   2.  Labs / imaging needed at time of follow-up: BMP, CBC  3.  Pending labs/ test needing follow-up: CT CAP from 9/30  Follow-up Appointments:  Contact information for after-discharge care     Destination     [facility] Preferred SNF .   Service: Skilled Nursing Contact information: [address] [place] [address] [phone]                     Hospital Course by problem list: Hospital Course  Tabitha Ramirez is a 77 y.o. 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 Myer Haff at [facility]), HLD, HTN, GERD, and hypothyroidism, who presented to the ED on 9/26 with facial twitching and left sided weakness. She was admitted to the Internal Medicine Teaching Service on 9/27 for further neurology workup due to concerns for facial motor seizure.   Patient was recently hospitalized at [facility] on 05/21/2023 for breakthrough seizures, discharged to [facility] where she has been for past few weeks.    #Hx of Breast cancer with metastasis to brain s/p resection #Left sided facial twitching  On admission, patient presented with facial twitching and left sided weakness after having a full body seizure at the rehab center. Dr. Barbaraann Cao, her onco-neurologist, recommended increasing VIMPAT to 100 mg BID to help manage twitching, but daughter concerned for ongoing seizures and decided to come in to ED for further evaluation as she did not feel  safe taking her mother home.    Patient was brought in my EMS with a BP of 112/69, pulse pf 82, and temperature 98.2 F (36.8 C). On physical exam patient was noted to speak with a hoarse voice, have 4/5 left sided weakness, numbness on left face along the V2-V3 distribution, and left-sided twitching. Neurology was consulted in the ED.  MRI on admission showed recurrent enhancing tumor along significant portions of the right parietal lobe measuring 5.9 x 6.8 x 2.1 cm concerning for recurrent tumor in resection site. 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 One seizure without clinical signs was noted on 06/22/2023 at 1421 arising from the right parietal region lasting about 3 minutes.  Patient was started on an increased dose of Vimpat 100 mg BID, per Dr. Liana Gerold recommendation, Anastrozole/Arimidex 1 mg daily, valproate/Decadron 4 mg q12h,  levetiracetam/Keppra 1000 mg TID, and oxy 5 mg q12h PRN for breakthrough pain. On hospitalization day 1 patient denied any acute pain or headache and only complained of left sided shaking/twitching. Patient was evaluated by OT and PT who recommended SNF placement at discharge.    [name], [name], has been involved in medical decision-making. Decision made by team to consult palliative care team to further discuss goals of care given recurrent hospitalizations for breakthrough seizures and new left-sided twitching in the setting of tumor recurrence. Discussion with Dr. Barbaraann Cao on 9/27 with plan to review imaging results and tumor board recommendations for next steps with patient during outpatient visit scheduled for 9/30.   Per neurology, EEG showed subclinical seizure 1x 06/22/23 at 1421 lasting about 3 minutes. Otherwise patient's EEG showing evidence of  epileptogenecity and cortical dysfunction arising from right hemisphere, maximal right parietal region with increased risk of seizures. There was mild diffuse encephalopathy. No other new seizures noted. Neurology did not indicate a need to adjust patient's seizure medication regimen.   Cause of seizure is likely recurrence of the brain mass. Dr. Barbaraann Cao wanted to follow up outpatient after he discusses case on tumor board and with radiology team. Appears plan is for restaging and looking into treatment options. Discussed with oncology and they recommended getting CT CAP while inpatient. Her neurology/oncology team will follow up on the results of the CT CAP from 9/30 in the outpatient setting. Plan to continue current measures including Vimpat 100 mg BID, Valproate, Keppra 1000 mg TID, and anastrazole 1 mg daily until outpatient follow up. Per Dr. Barbaraann Cao, patient's Decadron 4 mg BID was changed to daily on discharge.   Palliative care saw pt while she was admitted and recommended continued outpatient palliative care discussions. They were able to change her code status to DNR which is very appropriate for the patient given her co-morbidities. An ambulatory referral to palliative care was placed upon discharge.   Consulted with Dr. Barbaraann Cao, added Rivaroxaban/Xarelto 10 mg once a day for anticoagulation at discharge.   #Right breast pain Patient with history of partial right mastectomy (2015), right breast lumpectomy (2015). Per chart review, patient has had 10 fractions to her right breast. Has history of occasional sharp pains in the right breast, has taken 2 norco per day for pain in the past. Endorsed intermittent pain, similar to what she has experienced before, throughout her hospitalization. Per patient PRN oxy works to relieve pain. Her pain regimen was increased to oxy 5 mg q6 hrs during admission. Upon discharge, patient's oxy 5 mg was discontinued and her oxycodone-acetaminophen (q6 hours as needed  for severe pain) was resumed.    #HLD Chronic problem. Continued patient's home rosuvastatin 10 mg.    #HTN Chronic. Patient with intermittent low blood pressures, otherwise remained HDS with systolic pressures 100s-120s/ diastolic pressures 50s-70s. Her home triamterene- HTCZ 37.5 mg-25 mg daily was restarted.    #Hypothyroidism  Chronic. Restarted home levothyroxine 100 mcg daily.   #Anemia Chronic. Hgb on admission 9.9, improved to 11.6 on hospitalization day 1. Hgb baseline 9-10. Stable during hospitalization and patient was restarted on home iron sulfate tablet 325 mg. Hgb on day of discharge 10.4.   Discharge Subjective:  States doing well. No acute concerns. Appreciative of the care she is receiving. Chronic right breast pain improved with PRN oxy. Conversing appropriately in complete sentences. Denies any new twitching, CP, SOB, N/V, diarrhea, or pain.   Discharge Exam:   BP 113/69 (BP Location: Left Arm)  Pulse 77   Temp 98.1 F (36.7 C) (Oral)   Resp 18   Ht 5\' 3"  (1.6 m)   Wt 57.6 kg   SpO2 100%   BMI 22.50 kg/m  Physical Exam:  General: patient resting comfortably in bed in no acute distress, answering questions appropriately, voice slightly less hoarse than previous physical exams.  Lungs: normal respiratory effort on room air.  Cardiovascular: RRR, no r/b/g, no LE edema.  Abdomen: soft, non-tender, non-distended.  MSK: strength continues to be better on R upper and lower extremities, able to move left sided upper and lower extremities spontaneously.  Neuro: alert and oriented, minimal left sided facial twitching.  Psych: normal mood and affect.   Pertinent Labs, Studies, and Procedures:     Latest Ref Rng & Units 06/26/2023   11:12 AM 06/25/2023    7:28 AM 06/24/2023    4:34 PM  CBC  WBC 4.0 - 10.5 K/uL 14.9  13.5  13.4   Hemoglobin 12.0 - 15.0 g/dL 44.0  9.3  34.7   Hematocrit 36.0 - 46.0 % 31.1  28.0  29.6   Platelets 150 - 400 K/uL 400  395  330         Latest Ref Rng & Units 06/26/2023    5:31 AM 06/25/2023    7:33 AM 06/24/2023    4:34 PM  CMP  Glucose 70 - 99 mg/dL 425  956  387   BUN 8 - 23 mg/dL 38  41  47   Creatinine 0.44 - 1.00 mg/dL 5.64  3.32  9.51   Sodium 135 - 145 mmol/L 134  135  139   Potassium 3.5 - 5.1 mmol/L 4.6  4.4  4.9   Chloride 98 - 111 mmol/L 97  100  98   CO2 22 - 32 mmol/L 23  23  23    Calcium 8.9 - 10.3 mg/dL 9.5  9.4  9.5   Total Protein 6.5 - 8.1 g/dL   7.7   Total Bilirubin 0.3 - 1.2 mg/dL   0.5   Alkaline Phos 38 - 126 U/L   79   AST 15 - 41 U/L   28   ALT 0 - 44 U/L   30     CT Head Wo Contrast  Result Date: 06/22/2023 CLINICAL DATA:  Status post seizure. EXAM: CT HEAD WITHOUT CONTRAST TECHNIQUE: Contiguous axial images were obtained from the base of the skull through the vertex without intravenous contrast. RADIATION DOSE REDUCTION: This exam was performed according to the departmental dose-optimization program which includes automated exposure control, adjustment of the mA and/or kV according to patient size and/or use of iterative reconstruction technique. COMPARISON:  May 21, 2023 FINDINGS: Brain: There is mild cerebral atrophy with widening of the extra-axial spaces and ventricular dilatation. There are areas of decreased attenuation within the white matter tracts of the supratentorial brain, consistent with microvascular disease changes. A 1.9 cm x 1.7 cm x 2.2 cm well-defined area of encephalomalacia (approximately 18.98 Hounsfield units) is seen within the posterior parietal region on the right. A mild-to-moderate amount of surrounding white matter low attenuation is seen. Adjacent 3.9 cm x 1.4 cm x 1.3 cm isodense area is seen along the posterior parietal region on the right (approximately 47.88 Hounsfield units). This extends along the right parietal lobe resection cavity and corresponds to findings seen within this region on the most recent MR head (June 20, 2023). Vascular: There is marked  severity calcification of the bilateral cavernous carotid  arteries. Skull: A right posterior parietal craniotomy defect is seen. Sinuses/Orbits: Mild right maxillary sinus mucosal thickening is seen. Other: None. IMPRESSION: 1. Right posterior parietal craniotomy defect with a right posterior parietal lobe resection cavity. 2. Isodense area along the right posterior parietal dissection cavity which likely corresponds to findings seen within this region on the most recent MR head ( June 20, 2023) and is concerning for the presence of recurrent tumor. 3. Generalized cerebral atrophy with microvascular disease changes of the supratentorial brain. Electronically Signed   By: Aram Candela M.D.   On: 06/22/2023 01:23     CT Chest Abdomen and Pelvis EXAM: CT CHEST, ABDOMEN, AND PELVIS WITH CONTRAST   TECHNIQUE: Multidetector CT imaging of the chest, abdomen and pelvis was performed following the standard protocol during bolus administration of intravenous contrast.   RADIATION DOSE REDUCTION: This exam was performed according to the departmental dose-optimization program which includes automated exposure control, adjustment of the mA and/or kV according to patient size and/or use of iterative reconstruction technique.   CONTRAST:  75mL OMNIPAQUE IOHEXOL 350 MG/ML SOLN   COMPARISON:  CT chest, abdomen and pelvis dated September 06, 2022   FINDINGS: CT CHEST FINDINGS   Cardiovascular: Normal heart size. Trace pericardial effusion. Normal caliber thoracic aorta with moderate atherosclerotic disease. Severe coronary artery calcifications.   Mediastinum/Nodes: Esophagus and thyroid are unremarkable. Surgical clips of the right axilla. No enlarged lymph nodes seen in the chest.   Lungs/Pleura: Central airways are patent. Subpleural reticular opacities of the right upper lobe, consistent with postradiation change. Unchanged irregular linear opacities of the right upper lobe, likely due  to scarring. New subpleural nodular opacity of the left lower lobe measuring 1.4 x 0.8 cm on series 2, image 47. Additional scattered small solid pulmonary nodules. Reference solid nodule of the right upper lobe measuring 3 mm on series 3, image 42.   Musculoskeletal: No aggressive appearing osseous lesions. Levocurvature of the thoracic spine.   CT ABDOMEN PELVIS FINDINGS   Hepatobiliary: No focal liver abnormality is seen. No gallstones, gallbladder wall thickening, or biliary dilatation.   Pancreas: Unremarkable. No pancreatic ductal dilatation or surrounding inflammatory changes.   Spleen: Normal in size without focal abnormality.   Adrenals/Urinary Tract: Adrenal glands are unremarkable. Kidneys are normal, without renal calculi, focal lesion, or hydronephrosis. Bladder is unremarkable.   Stomach/Bowel: Stomach is within normal limits. Appendix appears normal. No evidence of bowel wall thickening, distention, or inflammatory changes.   Vascular/Lymphatic: Aortic atherosclerosis. No enlarged abdominal or pelvic lymph nodes.   Reproductive: Status post hysterectomy. No adnexal masses.   Other: Prior right mastectomy. Unchanged calcified nodule of the right medial breast located on series 2, image 33, likely a fibroadenoma. No abdominopelvic ascites. To ax 70   Musculoskeletal: No aggressive appearing osseous lesions.   IMPRESSION: 1. New subpleural nodular opacity of the left lower lobe measuring up to 1.4 cm, could be due to focal atelectasis, although pulmonary nodule can not be excluded. Recommend short-term follow-up chest CT in 3 months to ensure resolution. 2. No evidence of metastatic disease in the abdomen or pelvis. 3. Coronary artery calcifications and aortic Atherosclerosis (ICD10-I70.0).     Electronically Signed   By: Allegra Lai M.D.   On: 06/25/2023 20:58  Discharge Instructions: Discharge Instructions     Amb Referral to Palliative Care    Complete by: As directed    Call MD for:  difficulty breathing, headache or visual disturbances   Complete by: As directed  Call MD for:  extreme fatigue   Complete by: As directed    Call MD for:  hives   Complete by: As directed    Call MD for:  persistant dizziness or light-headedness   Complete by: As directed    Call MD for:  persistant nausea and vomiting   Complete by: As directed    Call MD for:  redness, tenderness, or signs of infection (pain, swelling, redness, odor or green/yellow discharge around incision site)   Complete by: As directed    Call MD for:  severe uncontrolled pain   Complete by: As directed    Call MD for:  temperature >100.4   Complete by: As directed    Diet - low sodium heart healthy   Complete by: As directed    Discharge instructions   Complete by: As directed    You came to the hospital after having left sided twitching concerning for facial motor seizure. Our testing showed that, unfortunately, the cause of your seizure is likely due to recurrence of a tumor in your brain. Neurology was consulted and they determined that you had no further seizure activity. Neurology at [facility] and your doctors, Dr. Barbaraann Cao and Dr. Pamelia Hoit, provided some recommendations for your care. It is very important that you continue taking these medications until you can follow up with your doctors. We are sending you to a skilled nursing facility so you can work with therapists who can help you regain your strength.   It is very important that you follow up with Dr. Barbaraann Cao, Dr. Pamelia Hoit, and your primary care doctor so that they can help you stay on the right medications and the appropriate doses.   These are the medications that you MUST continue taking until you can follow up with your doctors:    Lacosamide/Vimpat 100 mg twice a day  Dexamethasone/Decadron 4 mg once a day  Levetiracetam/Keppra 1000 mg three times a day   Anastrazole/Arimidez 1 mg once a day    Rosuvastatin/Crestor 10 mg once a day   Triamterene-hydrochlorothiazide/Maxzide 37.5 mg-25 mg once a day   Levothyroxine/Synthroid 100 mcg once a day   Ferrous sulfate tablet 325 mg once a day   Tylenol every six hours as needed for mild pain   Oxycodone-acetaminophen every 6 hours as needed for severe pain   Vitamin B-12 500 mcg by mouth once a day   Vitamin D3 1 capsule once a day   Potassium 10 mEq tablet once a day   More imaging was performed on 9/30 to help your doctors with next steps in your care. Your Drs., Dr. Barbaraann Cao or Dr. Kathrynn Running, can review the results of your CT with you when you see them in the next week.   Your upcoming appointments include:  - 06/27/2023 10:30 PM telephone visit with Judithann Graves, Wynonia Sours, NP  - 06/27/2023  1:30 PM CT visit with Dr. Margaretmary Dys, MD  Our palliative care team will continue to follow up with you in the outpatient setting, they will be giving you a call sometime after you leave the hospital.   We are so glad you are feeling better, it was a pleasure taking care of you during your stay!  - Dr. Justin Mend and the Internal Medicine Teaching Service team   Increase activity slowly   Complete by: As directed      **Please note we added Rivaroxaban/Xarelto 10 mg once a day for anticoagulation at discharge after the above list was updated.  Signed: Nocholas Damaso Colbert Coyer, MD Eligha Bridegroom. [facility] Internal Medicine Residency, PGY-1 06/26/2023, 3:38 PM

## 2023-06-26 NOTE — Plan of Care (Signed)

## 2023-06-26 NOTE — Progress Notes (Signed)
OT Cancellation Note  Patient Details Name: DESMA WILKOWSKI MRN: [id number] DOB: [birth year]   Cancelled Treatment:    Reason Eval/Treat Not Completed: Other (comment) Pt eating breakfast on OT attempt. Pt agreeable for OT follow up as schedule permits.   Lorre Munroe 06/26/2023, 11:37 AM

## 2023-06-26 NOTE — Discharge Instructions (Addendum)
You came to the hospital after having left sided twitching concerning for facial motor seizure. Our testing showed that, unfortunately, the cause of your seizure is likely due to recurrence of a tumor in your brain. The neurology team was consulted and they helped to monitor during your stay. Based on their testing, they determined that you had no further seizure activity while you were hospitalized with [place]. The neurology team and your doctors, Dr. Barbaraann Cao and Dr. Pamelia Hoit, provided some recommendations for your medications that we incorporated into your care. It is very important that you continue taking your medications until you can follow up with your doctors. We are sending you to a skilled nursing facility so you can continue to work with therapists.   It is very important that you follow up with Dr. Barbaraann Cao, Dr. Pamelia Hoit, and your primary care doctor so that they can help you stay on the right medications and the appropriate doses.   These are the medications that you MUST continue taking until you can follow up with your doctors:   Lacosamide/Vimpat 100 mg twice a day Dexamethasone/Decadron 4 mg once a day Levetiracetam/Keppra 1000 mg three times a day  Anastrazole/Arimidez 1 mg once a day  Rosuvastatin/Crestor 10 mg once a day  Triamterene-hydrochlorothiazide/Maxzide 37.5 mg-25 mg once a day  Levothyroxine/Synthroid 100 mcg once a day  Ferrous sulfate tablet 325 mg once a day  Tylenol every six hours as needed for mild pain  Oxycodone-acetaminophen every 6 hours as needed for severe pain  Vitamin B-12 500 mcg by mouth once a day  Vitamin D3 1 capsule once a day  Potassium 10 mEq tablet once a day  Rivaroxaban/Xarelto 10 mg once a day   More imaging was performed on 9/30 to help your doctors with next steps in your care. Your Drs., Dr. Barbaraann Cao or Dr. Kathrynn Running, can review the results of your CT with you when you see them in the next week.   Your upcoming appointments include:  06/27/2023 10:30  PM telephone visit with Judithann Graves, [place], NP  06/27/2023  1:30 PM CT visit with Dr. Margaretmary Dys, MD  We are so glad you are feeling better, it was a pleasure taking care of you during your stay!  - Dr. Justin Mend and the Internal Medicine Teaching Service team

## 2023-06-26 NOTE — Progress Notes (Signed)
I called and left a message on her daughter, Michelle's, voicemail to let her know that the CT C/A/P did not show any obvious systemic disease recurrence, only some small nodularity in the base of the left lung that is felt most likely to be atelectasis and will need to be followed with a repeat CT chest scan in 3 months to confirm stability and/or resolution.  Therefore, the consensus recommendation from the multidisciplinary brain tumor conference is to proceed with WBRT with a boost to the local recurrence in the resection cavity within the right parietal lobe. We are tentatively planning for CT SIM/treatment planning at 1:30pm 06/27/23 in anticipation of beginning a treatment course of 15 daily radiation treatments in the near future. Agree with reducing the dose of dexamethasone to 4mg  daily at discharge to [facility] facility and we will plan to taper her off completely once the radiation is completed. We will coordinate with the rehab facility for daily transportation to the cancer center for treatments. I advised [name] to call me with any questions or concerns related to this plan and otherwise, I will plan to speak directly with Ms. Kauri Garson when she comes for CT SIM on 06/27/23.  Marguarite Arbour, MMS, PA-C [place]  [facility] Radiation Oncology Physician Assistant Direct Dial: [phone]  Fax: [phone]

## 2023-06-26 NOTE — Plan of Care (Signed)

## 2023-06-26 NOTE — Progress Notes (Signed)
Occupational Therapy Treatment Patient Details Name: Tabitha Ramirez MRN: [id number] DOB: Mar 08, 1946 Today's Date: 06/26/2023   History of present illness Pt is a 77 y/o female admitted from [facility]hab 9/26 due to seizure activity concerns. LTM EEG with one seizure activity 9/27. PMH: Metastatic breast cancer to the brain s/p right parietal craniotomy and tumor resection on 03/11/2022, seizures, HLD, HTN, GERD, hypothyroidism   OT comments  Pt making steady progress towards OT goals. Pt able to progress mobility to/from sink with RW but requires Mod A and step by step cues for safe sequencing. Pt able to assist with seated UB ADLs while cued for problem solving and coordination of LUE. Pt continues to require hands on assist for all standing tasks and ADLs. Continue to recommend inpatient follow up therapy, <3 hours/day at DC with pt agreeable.       If plan is discharge home, recommend the following:  Two people to help with walking and/or transfers;A lot of help with walking and/or transfers;A lot of help with bathing/dressing/bathroom;Two people to help with bathing/dressing/bathroom   Equipment Recommendations  Wheelchair cushion (measurements OT);Wheelchair (measurements OT);Hoyer lift    Recommendations for [employer]      Precautions / Restrictions Precautions Precautions: Fall Precaution Comments: seizures Restrictions Weight Bearing Restrictions: No       Mobility Bed Mobility Overal bed mobility: Needs Assistance Bed Mobility: Supine to Sit, Sit to Supine     Supine to sit: Min assist, HOB elevated Sit to supine: Mod assist   General bed mobility comments: able to lift trunk and bring LE off but required assist to scoot forward with bed pad. Mod A to get BLE back to bed and guide trunk d/t incoordination    Transfers Overall transfer level: Needs assistance Equipment used: Rolling walker (2 wheels) Transfers: Sit to/from Stand Sit to Stand: Mod assist            General transfer comment: Mod A to stand from bedside with RW and assist to place L hand on RW. Heavy Mod A to stand from chair without armrests at sink     Balance Overall balance assessment: Needs assistance Sitting-balance support: No upper extremity supported Sitting balance-Leahy Scale: Fair     Standing balance support: Bilateral upper extremity supported, Single extremity supported, During functional activity Standing balance-Leahy Scale: Poor                             ADL either performed or assessed with clinical judgement   ADL Overall ADL's : Needs assistance/impaired     Grooming: Moderate assistance;Standing;Sitting;Wash/dry face;Applying deodorant Grooming Details (indicate cue type and reason): Mod A for initial attempts standing to wash face with pt fatiguing and needing to sit down to complete remainder of tasks. Cues for problem solving and awareness of L UE/coordination to apply deodorant sitting down                       Toileting - Clothing Manipulation Details (indicate cue type and reason): inquired about bathroom mobility with pt agreeable then reported going to use purewick first before getting OOB. Purewick did not work and bed linen soiled     Functional mobility during ADLs: Moderate assistance;Rolling walker (2 wheels);Cueing for sequencing;Cueing for safety      Extremity/Trunk Assessment Upper Extremity Assessment Upper Extremity Assessment: LUE deficits/detail;Right hand dominant LUE Deficits / Details: hx of prior CVA reporting decreased control/coordination at  times but AROM WFL   Lower Extremity Assessment Lower Extremity Assessment: Defer to PT evaluation        Vision   Vision Assessment?: No apparent visual deficits   Perception     Praxis      Cognition Arousal: Alert Behavior During Therapy: Flat affect Overall Cognitive Status: Impaired/Different from baseline Area of Impairment: Awareness,  Safety/judgement, Problem solving, Attention, Following commands                   Current Attention Level: Selective   Following Commands: Follows one step commands with increased time Safety/Judgement: Decreased awareness of safety, Decreased awareness of deficits Awareness: Emergent Problem Solving: Slow processing, Difficulty sequencing, Requires verbal cues General Comments: requires cues for problem solving, safety, attention to L side. slower processing noted        Exercises      Shoulder Instructions       General Comments      Pertinent Vitals/ Pain       Pain Assessment Pain Assessment: No/denies pain  Home Living                                          Prior Functioning/Environment              Frequency  Min 1X/week        Progress Toward Goals  OT Goals(current goals can now be found in the care plan section)  Progress towards OT goals: Progressing toward goals  Acute Rehab OT Goals Patient Stated Goal: be able to walk with cane again OT Goal Formulation: With patient Time For Goal Achievement: 07/06/23 Potential to Achieve Goals: Good ADL Goals Pt Will Perform Lower Body Bathing: with min assist;sit to/from stand;sitting/lateral leans Pt Will Perform Lower Body Dressing: with min assist;sit to/from stand;sitting/lateral leans Pt Will Transfer to Toilet: with min assist;stand pivot transfer;bedside commode Pt Will Perform Toileting - Clothing Manipulation and hygiene: with mod assist;sit to/from stand;sitting/lateral leans Additional ADL Goal #1: Pt to demo sitting balance > 5 min EOB with no more than CGA during functional tasks  Plan      Co-evaluation                 AM-PAC OT "6 Clicks" Daily Activity     Outcome Measure   Help from another person eating meals?: A Little Help from another person taking care of personal grooming?: A Lot Help from another person toileting, which includes using toliet,  bedpan, or urinal?: Total Help from another person bathing (including washing, rinsing, drying)?: A Lot Help from another person to put on and taking off regular upper body clothing?: A Lot Help from another person to put on and taking off regular lower body clothing?: Total 6 Click Score: 11    End of Session Equipment Utilized During Treatment: Gait belt;Rolling walker (2 wheels)  OT Visit Diagnosis: Unsteadiness on feet (R26.81);Other abnormalities of gait and mobility (R26.89);Muscle weakness (generalized) (M62.81);Other symptoms and signs involving cognitive function   Activity Tolerance Patient tolerated treatment well   Patient Left in bed;with call bell/phone within reach;with bed alarm set   Nurse Communication Other (comment) (discussed with NT, purewick malfuntion)        Time: 1610-9604 OT Time Calculation (min): 29 min  Charges: OT General Charges $OT Visit: 1 Visit OT Treatments $Self Care/Home Management : 23-37 mins  Bradd Canary,  OTR/L Acute Rehab Services Office: [phone]   Lorre Munroe 06/26/2023, 2:07 PM

## 2023-06-26 NOTE — TOC Transition Note (Signed)
Transition of Care [facility]) - CM/SW Discharge Note   Patient Details  Name: Tabitha Ramirez MRN: [id number] Date of Birth: [birth year]  Transition of Care [facility]) CM/SW Contact:  Baldemar Lenis, LCSW Phone Number: 06/26/2023, 3:54 PM   Clinical Narrative:   CSW initiated insurance authorization for patient and received approval. CSW confirmed with [facility] that bed is available for today. CSW updated daughter in law, [name], and she is in agreement. MDs updated and patient state for discharge. CSW sent discharge information to [facility]. Transport arranged with [facility] for next available.  Nurse to call report to [phone].    Final next level of care: Skilled Nursing Facility Barriers to Discharge: Barriers Resolved   Patient Goals and CMS Choice CMS Medicare.gov Compare Post Acute Care list provided to:: Patient Choice offered to / list presented to : Patient, Adult Children  Discharge Placement                Patient chooses bed at: [facility] Patient to be transferred to facility by: [facility] Name of family member notified: [name] Patient and family notified of of transfer: 06/26/23  Discharge Plan and Services Additional resources added to the After Visit Summary for       Post Acute Care Choice: Skilled Nursing Facility                               Social Determinants of Health (SDOH) Interventions SDOH Screenings   Food Insecurity: No Food Insecurity (06/22/2023)  Housing: Low Risk  (06/22/2023)  Transportation Needs: No Transportation Needs (06/22/2023)  Utilities: Not At Risk (06/22/2023)  Depression (PHQ2-9): Low Risk  (12/27/2022)  Tobacco Use: Medium Risk (06/21/2023)     Readmission Risk Interventions     No data to display

## 2023-06-26 NOTE — Progress Notes (Deleted)
Palliative Medicine [facility]  [phone] [phone]   Name: Tabitha Ramirez Date: 06/26/2023 MRN: [id number]  DOB: 1945-12-09  Patient Care Team: [place], [place] [facility] as PCP - General (Family Medicine) Pickenpack-Cousar, Arty Baumgartner, NP as Nurse Practitioner (Nurse Practitioner) Van Clines, MD as Consulting Physician (Neurology)   I connected with Tabitha Ramirez on 06/26/23 at 10:30 AM EDT by phone and verified that I am speaking with the correct person using two identifiers.   I discussed the limitations, risks, security and privacy concerns of performing an evaluation and management service by telemedicine and the availability of in-person appointments. I also discussed with the patient that there may be a patient responsible charge related to this service. The patient expressed understanding and agreed to proceed.   Other persons participating in the visit and their role in the encounter: daughter Tabitha Ramirez   Patient's location: home  Provider's location: [facility]   Chief Complaint: follow up of symptom mangement   INTERVAL HISTORY: Tabitha Ramirez is a 77 y.o. 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  Palliative ask to see for symptom management and goals of care.   SOCIAL HISTORY:     reports that she has quit smoking. Her smoking use included cigarettes. She has never used smokeless tobacco. She reports current alcohol use. She reports that she does not use drugs.  ADVANCE DIRECTIVES: none in place   CODE STATUS: Full Code  PAST MEDICAL HISTORY: Past Medical History:  Diagnosis Date   Allergy    Anemia    Arthritis    "joints" (09/29/2013)   Breast cancer (HCC)    GERD (gastroesophageal reflux disease)    Heart murmur    History of blood transfusion    "related to chemo/breast cancer" (09/29/2013)   Hypertension    Migraines    Personal history of  chemotherapy    Personal history of radiation therapy    Radiation 11/05/13-12/24/13   Right breast    TMJ (temporomandibular joint syndrome)    "left; just dx'd" (09/29/2013    ALLERGIES:  is allergic to penicillins and nsaids.  MEDICATIONS:  No current facility-administered medications for this visit.   No current outpatient medications on file.   Facility-Administered Medications Ordered in Other Visits  Medication Dose Route Frequency Provider Last Rate Last Admin   anastrozole (ARIMIDEX) tablet 1 mg  1 mg Oral Daily Masters, Katie, Ramirez   1 mg at 06/25/23 1003   dexamethasone (DECADRON) tablet 4 mg  4 mg Oral Q12H Masters, Katie, Ramirez   4 mg at 06/25/23 2147   feeding supplement (ENSURE ENLIVE / ENSURE PLUS) liquid 237 mL  237 mL Oral BID BM Ginnie Smart, MD   237 mL at 06/25/23 1003   ferrous sulfate tablet 325 mg  325 mg Oral Q breakfast Masters, Katie, Ramirez   325 mg at 06/25/23 1003   heparin injection 5,000 Units  5,000 Units Subcutaneous Q8H Masters, Katie, Ramirez   5,000 Units at 06/26/23 0536   influenza vac split trivalent PF (FLULAVAL) injection 0.5 mL  0.5 mL Intramuscular Tomorrow-1000 Ginnie Smart, MD       lacosamide (VIMPAT) tablet 100 mg  100 mg Oral BID Alvira Monday, MD   100 mg at 06/25/23 2147   levETIRAcetam (KEPPRA) tablet 1,000 mg  1,000 mg Oral TID Rudene Christians, Ramirez   1,000 mg at 06/25/23 2147   levothyroxine (SYNTHROID) tablet  100 mcg  100 mcg Oral Q0600 Masters, Florentina Addison, Ramirez   100 mcg at 06/26/23 0536   oxyCODONE (Oxy IR/ROXICODONE) immediate release tablet 5 mg  5 mg Oral Q6H PRN Colbert Coyer, Priscila, MD       rosuvastatin (CRESTOR) tablet 10 mg  10 mg Oral Daily Masters, Katie, Ramirez   10 mg at 06/25/23 1002   triamterene-hydrochlorothiazide (MAXZIDE-25) 37.5-25 MG per tablet 1 tablet  1 tablet Oral Daily Masters, Katie, Ramirez   1 tablet at 06/25/23 1002    VITAL SIGNS: T 98.5, HR 84, R 16, BP 163/87   Estimated body mass index is 22.5 kg/m as  calculated from the following:   Height as of 06/22/23: 5\' 3"  (1.6 m).   Weight as of 06/22/23: 127 lb (57.6 kg).   PERFORMANCE STATUS (ECOG) : 1 - Symptomatic but completely ambulatory    IMPRESSION:    PLAN: Continue Oxycodone/APAP 5/325 mg every 6 hours as needed for pain Continue Lorazepam 0.5 mg as needed for anxiety.  We will continue to closely monitor and adjust medications for symptom management. Ongoing goals of care discussions and support as needed I will plan to see patient back in 4-6 weeks in collaboration with her oncology appointments.  Daughter knows to contact our office sooner if needed.   Patient expressed understanding and was in agreement with this plan. She also understands that She can call the clinic at any time with any questions, concerns, or complaints.   Any controlled substances utilized were prescribed in the context of palliative care. PDMP has been reviewed.    Visit consisted of counseling and education dealing with the complex and emotionally intense issues of symptom management and palliative care in the setting of serious and potentially life-threatening illness.Greater than 50%  of this time was spent counseling and coordinating care related to the above assessment and plan.  Willette Alma, AGPCNP-BC  Palliative Medicine Team/[facility]  *Please note that this is a verbal dictation therefore any spelling or grammatical errors are due to the "[employer]agon [employer] One" system interpretation.

## 2023-06-26 NOTE — Progress Notes (Signed)
Radiation Oncology         [phone] ________________________________  Name: Tabitha Ramirez MRN: [id number]  Date: 06/27/2023  DOB: 01/01/1946  SIMULATION AND TREATMENT PLANNING NOTE    ICD-10-CM   1. 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, right (HCC)  C50.911    C79.31       DIAGNOSIS:  77 y/o female with locally recurrent brain metastasis at the resection site in the right parietal lobe  NARRATIVE:  The patient was brought to the CT Simulation planning suite.  Identity was confirmed.  All relevant records and images related to the planned course of therapy were reviewed.  The patient freely provided informed written consent to proceed with treatment after reviewing the details related to the planned course of therapy. The consent form was witnessed and verified by the simulation staff.  Then, the patient was set-up in a stable reproducible  supine position for radiation therapy.  CT images were obtained.  Surface markings were placed.  The CT images were loaded into the planning software.  Then the target and avoidance structures were contoured.  Treatment planning then occurred.  The radiation prescription was entered and confirmed.  Then, I designed and supervised the construction of a total of 3 medically necessary complex treatment device including a custom made thermoplastic mask used for immobilization, and two MLC collimator apertures for radiotherapy from the right and left side, with independent collimation for each to account for beam divergence.  I have requested : Isodose Plan.    PLAN:  The whole brain will be treated to 30 Gy in 10 fractions with a boost to the local tumor recurrence in the resection cavity within the right parietal lobe.  ________________________________  Artist Pais. Kathrynn Running, M.D.

## 2023-06-26 NOTE — Telephone Encounter (Signed)
Called pt daughter to advise of planned CT simulation 06/27/2023 @ 1330. Discussed transportation with CSW assigned to TOC. We will coordinate transportation from either [facility] or rehab facility.

## 2023-06-27 ENCOUNTER — Ambulatory Visit
Admission: RE | Admit: 2023-06-27 | Discharge: 2023-06-27 | Disposition: A | Discharge status: Home / Self Care | Payer: Medicare PPO | Source: Ambulatory Visit | Attending: Radiation Oncology | Admitting: Radiation Oncology

## 2023-06-27 ENCOUNTER — Telehealth: Payer: Self-pay

## 2023-06-27 ENCOUNTER — Ambulatory Visit: Payer: Medicare PPO | Admitting: Radiation Oncology

## 2023-06-27 ENCOUNTER — Inpatient Hospital Stay: Payer: Medicare PPO | Attending: Hematology and Oncology | Admitting: Nurse Practitioner

## 2023-06-27 DIAGNOSIS — C50812 Malignant neoplasm of overlapping sites of left female breast: Secondary | ICD-10-CM | POA: Insufficient documentation

## 2023-06-27 DIAGNOSIS — C7931 Secondary malignant neoplasm of brain: Secondary | ICD-10-CM

## 2023-06-27 DIAGNOSIS — Z510 Encounter for antineoplastic radiation therapy: Secondary | ICD-10-CM | POA: Insufficient documentation

## 2023-06-27 DIAGNOSIS — C50911 Malignant neoplasm of unspecified site of right female breast: Secondary | ICD-10-CM

## 2023-06-27 NOTE — Progress Notes (Signed)
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     Interval Since Last Radiation: 14 months 04/27/22 - 05/03/22:  Post-op fractionated SRS to the Right parietal resection cavity to 27 Gy in 3 fractions of 9 Gy each.    11/05/13 - 12/12/13:   Right breast / 45 Gray @ 1.8 Wallace Cullens per fraction x 25 fractions Michell Heinrich) Right supraclavicular fossa / 45 Gy @1 .8 Gy per fraction x 25 fractions Michell Heinrich)   12/15/13 - 12/24/13: Right breast boost / 16 Gray at 2 Wallace Cullens per fraction x 8 fractions Michell Heinrich)   Narrative:   She tolerated radiation treatment relatively well without any ill side effects.                              She was recently brought to the emergency department on 06/19/2023 from [facility] rehab with complaints of left-sided facial weakness and facial twitching following a seizure at the facility the day prior.  MRI brain performed on admission showed recurrent enhancing tumor along significant portions of the right parietal lobe, partially abutting the right parietal lobe resection cavity and involving the overlying dura, measuring 5.9 x 6.8 x 2.1 cm.  Additionally, there are 2 nodular foci of enhancement at the anterior aspect of the resection cavity measuring up to 7 mm with extensive surrounding signal abnormality within the posterior right cerebral hemisphere which appears to have progressed and likely reflects a combination of edema and posttreatment changes.  There were no new sites of intracranial metastatic disease elsewhere.  Since she had been lost to follow-up with medical oncology  since December 2023, a CT C/A/P was performed on 06/25/2023 for disease restaging and fortunately did not show any obvious disease recurrence or progression.  There was a new subpleural nodular opacity in the left lower lobe lung measuring 1.4 cm, felt likely to be due to focal atelectasis but recommended short interval follow-up CT chest in 3 months to confirm resolution.   Her case was discussed in the recent CNS tumor board on 06/25/2023 and the recommendation was to proceed with whole brain radiotherapy with a boost to the local disease recurrence if there were no findings to suggest systemic disease recurrence warranting chemotherapy.  It was noted that the tumor burden was mostly dural based, on the body side of the blood-brain barrier, which could be amenable to conventional chemotherapy or systemic therapy if utilized.  Given there was no indication of systemic disease recurrence in the chest, abdomen or pelvis, she was discharged to [facility] on 06/26/2023, on dexamethasone 4 mg daily and follows up with [place] today as an outpatient to discuss the role of radiotherapy to the recurrent brain disease.  On review of systems, the patient reports that she has been doing fair in general.  Unfortunately, she has had 3 hospital admissions in the last 2 months due to breakthrough seizure activity despite taking her  seizure prophylactic medications as prescribed.  Currently, she denies any further seizure activity since her recent hospital admission and she specifically denies persistent or frequent headaches or changes in her vision.  She does have some residual left-sided weakness but denies any numbness or tingling in the upper or lower extremities.  She continues to experience breast pain, which she contributes to her history of breast cancer. She is also followed with Lowella Bandy, NP in palliative care, for symptom management/pain management  of her chemo-induced neuropathic pain and she reports that this has  remained well controlled with her current regimen. She has been under the care of Dr. Pamelia Hoit for surveillance of her systemic disease but has been lost to follow-up since December 2023.  Prior to her recent hospital admission, she had been off all steroids since completing the stereotactic radiation but had continued on seizure prophylaxis with Keppra and Vimpat.  She has been following with Dr. Barbaraann Cao for the seizure activity and was last seen by him in July 2024.  ALLERGIES:  is allergic to nsaids and penicillins.   Meds:       Current Outpatient Medications  Medication Sig Dispense Refill   acetaminophen (TYLENOL) 325 MG tablet Take 1-2 tablets (325-650 mg total) by mouth every 4 (four) hours as needed for mild pain.       anastrozole (ARIMIDEX) 1 MG tablet Take 1 tablet (1 mg total) by mouth daily. 90 tablet 3   Cholecalciferol (D3-1000) 25 MCG (1000 UT) capsule Take 1 capsule (1,000 Units total) by mouth daily. 30 capsule 0   DULoxetine (CYMBALTA) 30 MG capsule Take 1 capsule (30 mg total) by mouth daily. 30 capsule 0   ferrous sulfate 325 (65 FE) MG tablet Take 1 tablet (325 mg total) by mouth daily with breakfast. 30 tablet 3   levETIRAcetam (KEPPRA) 500 MG tablet Take 1 tablet (500 mg total) by mouth 2 (two) times daily. 120 tablet 0   levothyroxine (SYNTHROID) 50 MCG tablet Take 1 tablet (50 mcg total) by mouth daily. 30 tablet 0   LORazepam (ATIVAN) 0.5 MG tablet Take 1 tablet (0.5 mg total) by mouth as needed for anxiety (take 1 tablet 30 minutes prior to MRI scans and radiation visits, may repeat once, just prior to procedures if needed). 30 tablet 0   oxyCODONE-acetaminophen (PERCOCET/ROXICET) 5-325 MG tablet Take 1 tablet by mouth every 8 (eight) hours as needed for severe pain. 60 tablet 0   potassium chloride (KLOR-CON) 10 MEQ tablet Take 10 mEq by mouth daily. (Patient not taking: Reported on 04/21/2022)       rosuvastatin (CRESTOR) 10 MG tablet Take 1 tablet (10 mg total) by  mouth daily. 30 tablet 0   vitamin B-12 (CYANOCOBALAMIN) 1000 MCG tablet Take 1 tablet (1,000 mcg total) by mouth daily. 30 tablet 0    No current facility-administered medications for this encounter.      Physical Findings: Vitals were not taken for this visit.  Unsteady gait - patient uses cane   Lab Findings: Recent Labs       Lab Results  Component Value Date    WBC 7.4 03/27/2022    HGB 9.1 (L) 03/27/2022    HCT 27.7 (L) 03/27/2022    MCV 102.6 (H) 03/27/2022    PLT 294 03/27/2022          Radiographic Findings: MR Brain W Wo Contrast 01/30/2023  Narrative & Impression  CLINICAL DATA:  Metastatic disease.   EXAM: MRI HEAD WITHOUT AND WITH CONTRAST  TECHNIQUE: Multiplanar, multiecho pulse sequences of the brain and surrounding structures were obtained without and with intravenous contrast.   CONTRAST:  6 cc Vueway   COMPARISON:  Brain MRI most recently 11/02/2022.   FINDINGS: Brain: Postsurgical changes reflecting right parietal craniotomy for mass resection are again seen. Dural thickening and enhancement underlying the craniotomy is similar to the prior studies. The cystic resection cavity measures 2 2.3 cm x 2.0 cm, not significantly changed. Smooth enhancement at the periphery of the cavity is similar to the prior study, measuring up to 2-3 mm in thickness antero inferiorly (16-14). FLAIR signal abnormality surrounding the cavity is unchanged. There is no new or progressive diffusion restriction. Postsurgical blood products in the cavity are unchanged. There is no new or progressive mass effect.   The punctate focus of enhancement in the left frontal cortex is unchanged without surrounding edema or mass effect (13-89).   There are no new lesions.   There is no acute intracranial hemorrhage, extra-axial fluid collection, or acute infarct.   Parenchymal volume is stable. The ventricles are stable in size. Patchy and confluent FLAIR signal  abnormality elsewhere in the supratentorial white matter and brainstem likely reflecting sequela of chronic small-vessel ischemic change is stable.   The pituitary and suprasellar region are normal. There is no midline shift.   Vascular: Normal flow voids.   Skull and upper cervical spine: There is no suspicious marrow signal abnormality.   Sinuses/Orbits: The paranasal sinuses are clear. The globes and orbits are unremarkable.   Other: None.   IMPRESSION: Stable exam with no new or progressive enhancement at the right parietal resection site, and unchanged punctate cortical lesion in the left frontal lobe. No new lesions.     Electronically Signed   By: Lesia Hausen M.D.   On: 02/02/2023 10:12     Impression/Plan: 1.         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  Today, we reviewed the results of her recent MRI brain scan performed 06/20/2023 showing recurrent enhancing tumor along significant portions of the right parietal lobe, partially abutting the right parietal lobe resection cavity and involving the overlying dura, measuring 5.9 x 6.8 x 2.1 cm.  Additionally, there are 2 nodular foci of enhancement at the anterior aspect of the resection cavity measuring up to 7 mm with extensive surrounding signal abnormality within the posterior right cerebral hemisphere which appears to have progressed and likely reflects a combination of edema and posttreatment changes.  There were no new sites of intracranial metastatic disease elsewhere.  We reviewed the consensus recommendation from CNS tumor board, for whole brain radiotherapy with a boost to the locally recurrent disease along the resection cavity and discussed risks, benefits and expected side effects.  She and her daughter, Tabitha Ramirez, were encouraged to ask questions that were answered to their stated  satisfaction.  They appear to have a good understanding of her disease and our recommendations and are in agreement with the plan to proceed with a 3-week course of daily whole brain radiotherapy.  She has freely signed written consent to proceed today in the office and a copy of this document will be placed in her medical record.  She will proceed with CT simulation/treatment planning following our discussion today, in anticipation of beginning her daily radiation treatments in the near future.  I enjoyed meeting with them again today and look  forward to continuing to participate in her care.   2.         Seizures. She has required hospital admission on 3 occasions over the past 2 months for breakthrough seizure activity despite taking Keppra and Vimpat as prescribed. She is currently on dexamethasone 4 mg daily in addition to her seizure prophylaxis and is being followed and managed by Dr. Barbaraann Cao so we will continue to follow expectantly.  We will plan to taper her off the dexamethasone once her radiation treatments are completed.   3. 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 She was treated with neoadjuvant chemotherapy followed by lumpectomy on 09/29/2013 and adjuvant breast radiotherapy 11/12/2013 through 12/24/2013.  Her most recent disease restaging CT C/A/P 06/25/23 was negative for any obvious active primary or metastatic disease. There was a new subpleural nodular opacity of the left lower lobe measuring up to 1.4 cm, could be due to focal atelectasis, although pulmonary nodule can not be excluded. Recommend short-term follow-up chest CT in 3 months to ensure resolution. Patient had a follow up visit with Dr. Pamelia Hoit last on 09/11/22 but was lost to follow up thereafter. She will plan to follow up with him with repeat imaging in 3 months and will also continue in routine follow up with Lowella Bandy, NP in palliative care for symptom/neuropathic pain management. We will continue  to follow expectantly.  I personally spent 45 minutes in this encounter including chart review, reviewing radiological studies,face to face discussion with the patient and her daughter, Tabitha Ramirez, entering orders and completing documentation.    Marguarite Arbour, MMS, PA-C [facility] Radiation Oncology Physician Assistant Direct Dial: [phone]  Fax: [phone]

## 2023-06-27 NOTE — Telephone Encounter (Signed)
RN called back to [facility] skilled nursing facility spoke with Stanton Kidney transportation coodinator about getting patient over to [facility] this afternoon at 1:30 pm for CT simulation appointment.  She wasn't sure if they could but said she would try to get patient over to appointment.  She stated that they usually need a 3 day advance before arranging appointments and before she left office on 06/26/2023 the patient wasn't admitted to their facility.  RN informed CT simulation of this message and we will see if the facility will be able to get patient over for appointment.

## 2023-06-27 NOTE — Telephone Encounter (Signed)
RN called [facility] skilled nursing facility to make arrangements for patient to be transported to [facility] for afternoon CT simulation appointment was told to call back at 9:30 am by receptionist when transportation coordinator Stanton Kidney) will be in facility.

## 2023-07-02 DIAGNOSIS — Z510 Encounter for antineoplastic radiation therapy: Secondary | ICD-10-CM | POA: Diagnosis not present

## 2023-07-04 ENCOUNTER — Other Ambulatory Visit: Payer: Self-pay

## 2023-07-04 ENCOUNTER — Ambulatory Visit
Admission: RE | Admit: 2023-07-04 | Discharge: 2023-07-04 | Disposition: A | Discharge status: Home / Self Care | Payer: Medicare PPO | Source: Ambulatory Visit | Attending: Radiation Oncology | Admitting: Radiation Oncology

## 2023-07-04 DIAGNOSIS — Z510 Encounter for antineoplastic radiation therapy: Secondary | ICD-10-CM | POA: Diagnosis not present

## 2023-07-04 LAB — RAD ONC ARIA SESSION SUMMARY
Course Elapsed Days: 0
Plan Fractions Treated to Date: 1
Plan Prescribed Dose Per Fraction: 2.5 Gy
Plan Total Fractions Prescribed: 10
Plan Total Prescribed Dose: 25 Gy
Reference Point Dosage Given to Date: 2.5 Gy
Reference Point Session Dosage Given: 2.5 Gy
Session Number: 1

## 2023-07-05 ENCOUNTER — Ambulatory Visit
Admission: RE | Admit: 2023-07-05 | Discharge: 2023-07-05 | Disposition: A | Discharge status: Home / Self Care | Payer: Medicare PPO | Source: Ambulatory Visit | Attending: Radiation Oncology | Admitting: Radiation Oncology

## 2023-07-05 ENCOUNTER — Other Ambulatory Visit: Payer: Self-pay

## 2023-07-05 DIAGNOSIS — Z510 Encounter for antineoplastic radiation therapy: Secondary | ICD-10-CM | POA: Diagnosis not present

## 2023-07-05 LAB — RAD ONC ARIA SESSION SUMMARY
Course Elapsed Days: 1
Plan Fractions Treated to Date: 2
Plan Prescribed Dose Per Fraction: 2.5 Gy
Plan Total Fractions Prescribed: 10
Plan Total Prescribed Dose: 25 Gy
Reference Point Dosage Given to Date: 5 Gy
Reference Point Session Dosage Given: 2.5 Gy
Session Number: 2

## 2023-07-06 ENCOUNTER — Ambulatory Visit
Admission: RE | Admit: 2023-07-06 | Discharge: 2023-07-06 | Disposition: A | Discharge status: Home / Self Care | Payer: Medicare PPO | Source: Ambulatory Visit | Attending: Radiation Oncology | Admitting: Radiation Oncology

## 2023-07-06 ENCOUNTER — Ambulatory Visit: Payer: Medicare PPO

## 2023-07-06 ENCOUNTER — Other Ambulatory Visit: Payer: Self-pay

## 2023-07-06 DIAGNOSIS — Z510 Encounter for antineoplastic radiation therapy: Secondary | ICD-10-CM | POA: Diagnosis not present

## 2023-07-06 LAB — RAD ONC ARIA SESSION SUMMARY
Course Elapsed Days: 2
Plan Fractions Treated to Date: 3
Plan Prescribed Dose Per Fraction: 2.5 Gy
Plan Total Fractions Prescribed: 10
Plan Total Prescribed Dose: 25 Gy
Reference Point Dosage Given to Date: 7.5 Gy
Reference Point Session Dosage Given: 2.5 Gy
Session Number: 3

## 2023-07-09 ENCOUNTER — Ambulatory Visit
Admission: RE | Admit: 2023-07-09 | Discharge: 2023-07-09 | Disposition: A | Discharge status: Home / Self Care | Payer: Medicare PPO | Source: Ambulatory Visit | Attending: Radiation Oncology | Admitting: Radiation Oncology

## 2023-07-09 ENCOUNTER — Inpatient Hospital Stay: Payer: Medicare PPO

## 2023-07-09 ENCOUNTER — Ambulatory Visit: Payer: Medicare PPO

## 2023-07-09 ENCOUNTER — Other Ambulatory Visit: Payer: Self-pay

## 2023-07-09 DIAGNOSIS — Z510 Encounter for antineoplastic radiation therapy: Secondary | ICD-10-CM | POA: Diagnosis not present

## 2023-07-09 LAB — RAD ONC ARIA SESSION SUMMARY
Course Elapsed Days: 5
Plan Fractions Treated to Date: 4
Plan Prescribed Dose Per Fraction: 2.5 Gy
Plan Total Fractions Prescribed: 10
Plan Total Prescribed Dose: 25 Gy
Reference Point Dosage Given to Date: 10 Gy
Reference Point Session Dosage Given: 2.5 Gy
Session Number: 4

## 2023-07-09 NOTE — Progress Notes (Signed)
Nutrition  Patient did not stay to see RD following radiation this pm.  Will ask scheduling to offer another appointment.   Charrise Lardner B. Freida Busman, RD, LDN Registered Dietitian [phone]

## 2023-07-09 NOTE — Progress Notes (Unsigned)
Palliative Medicine [facility]  Telephone:(336) [phone] Fax:([phone]) [phone]   Name: Tabitha Ramirez Date: 07/09/2023 MRN: [id number]  DOB: [birth year]  Patient Care Team: [place] [place] [facility] as PCP - General (Family Medicine) Pickenpack-Cousar, Arty Baumgartner, NP as Nurse Practitioner (Nurse Practitioner) Van Clines, MD as Consulting Physician (Neurology)   INTERVAL HISTORY: Tabitha Ramirez is a 77 y.o. 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  Palliative ask to see for symptom management and goals of care.   SOCIAL HISTORY:     reports that she has quit smoking. Her smoking use included cigarettes. She has never used smokeless tobacco. She reports current alcohol use. She reports that she does not use drugs.  ADVANCE DIRECTIVES: none in place   CODE STATUS: Full Code  PAST MEDICAL HISTORY: Past Medical History:  Diagnosis Date   Allergy    Anemia    Arthritis    "joints" (09/29/2013)   Breast cancer (HCC)    GERD (gastroesophageal reflux disease)    Heart murmur    History of blood transfusion    "related to chemo/breast cancer" (09/29/2013)   Hypertension    Migraines    Personal history of chemotherapy    Personal history of radiation therapy    Radiation 11/05/13-12/24/13   Right breast    TMJ (temporomandibular joint syndrome)    "left; just dx'd" (09/29/2013    ALLERGIES:  is allergic to penicillins and nsaids.  MEDICATIONS:  Current Outpatient Medications  Medication Sig Dispense Refill   acetaminophen (TYLENOL) 325 MG tablet Take 1-2 tablets (325-650 mg total) by mouth every 6 (six) hours as needed for mild pain. (Patient taking differently: Take 650 mg by mouth every 6 (six) hours as needed for mild pain.)     anastrozole (ARIMIDEX) 1 MG tablet TAKE 1 TABLET(1 MG) BY MOUTH DAILY 90 tablet 3   Cholecalciferol (D3-1000) 25 MCG (1000 UT) capsule Take 1 capsule (1,000  Units total) by mouth daily. (Patient not taking: Reported on 06/22/2023) 30 capsule 0   dexamethasone (DECADRON) 4 MG tablet Take 1 tablet (4 mg total) by mouth daily. Follow up with Dr. Barbaraann Cao to get refills.     feeding supplement (ENSURE ENLIVE / ENSURE PLUS) LIQD Take 237 mLs by mouth 2 (two) times daily between meals.     ferrous sulfate 325 (65 FE) MG tablet Take 325 mg by mouth daily.     lacosamide 100 MG TABS Take 1 tablet (100 mg total) by mouth 2 (two) times daily. 60 tablet 0   levETIRAcetam (KEPPRA) 1000 MG tablet Take 1 tablet (1,000 mg total) by mouth 3 (three) times daily.     levothyroxine (SYNTHROID) 100 MCG tablet Take 100 mcg by mouth daily before breakfast.     LORazepam (ATIVAN) 0.5 MG tablet Take 1 tablet (0.5 mg total) by mouth daily as needed for seizure. 20 tablet 0   LORazepam (ATIVAN) 2 MG/ML concentrated solution Take 0.5 mg by mouth See admin instructions. Inject 0.5 mg every 30 minutes as needed for seizure activity     potassium chloride (KLOR-CON) 10 MEQ tablet Take 10 mEq by mouth daily.     rivaroxaban (XARELTO) 10 MG TABS tablet Take 1 tablet (10 mg total) by mouth daily.     rosuvastatin (CRESTOR) 10 MG tablet Take 1 tablet (10 mg total) by mouth daily. (Patient taking differently: Take 10 mg by mouth every evening.) 30 tablet 0  triamterene-hydrochlorothiazide (MAXZIDE-25) 37.5-25 MG tablet Take 1 tablet by mouth daily.     vitamin B-12 (CYANOCOBALAMIN) 500 MCG tablet Take 500 mcg by mouth daily.     No current facility-administered medications for this visit.    VITAL SIGNS: T 98.5, HR 84, R 16, BP 163/87   Estimated body mass index is 22.5 kg/m as calculated from the following:   Height as of 06/22/23: 5\' 3"  (1.6 m).   Weight as of 06/22/23: 127 lb (57.6 kg).   PERFORMANCE STATUS (ECOG) : 1 - Symptomatic but completely ambulatory  Assessment NAD RRR Normal breathing pattern Right side weakness Alert and oriented x3   IMPRESSION:  Ms. Bergfeld  presents to clinic for follow-up. No acute distress. States she is at rehab facility. Doing well overall. No recent seizure activity. Doing well overall. Denies nausea, vomiting, constipation, or diarrhea.   Occasional pain and headaches. Reports things are manageable with medication. Tolerating radiation. Is taking things one day at a time.   We will continue to offer support as needed.    PLAN: Continue Oxycodone/APAP 5/325 mg every 6 hours as needed for pain Continue Lorazepam 0.5 mg as needed for anxiety.  We will continue to closely monitor and adjust medications for symptom management. Ongoing goals of care discussions and support as needed I will plan to see patient back in 4-6 weeks in collaboration with her oncology appointments.  Daughter knows to contact our office sooner if needed.   Patient expressed understanding and was in agreement with this plan. She also understands that She can call the clinic at any time with any questions, concerns, or complaints.   Any controlled substances utilized were prescribed in the context of palliative care. PDMP has been reviewed.    Visit consisted of counseling and education dealing with the complex and emotionally intense issues of symptom management and palliative care in the setting of serious and potentially life-threatening illness.  Willette Alma, AGPCNP-BC  Palliative Medicine Team/[facility]  *Please note that this is a verbal dictation therefore any spelling or grammatical errors are due to the "Dragon Medical One" system interpretation.

## 2023-07-10 ENCOUNTER — Ambulatory Visit
Admission: RE | Admit: 2023-07-10 | Discharge: 2023-07-10 | Disposition: A | Discharge status: Home / Self Care | Payer: Medicare PPO | Source: Ambulatory Visit | Attending: Radiation Oncology

## 2023-07-10 ENCOUNTER — Ambulatory Visit
Admission: RE | Admit: 2023-07-10 | Discharge: 2023-07-10 | Disposition: A | Discharge status: Home / Self Care | Payer: Medicare PPO | Source: Ambulatory Visit | Attending: Radiation Oncology | Admitting: Radiation Oncology

## 2023-07-10 ENCOUNTER — Other Ambulatory Visit: Payer: Self-pay

## 2023-07-10 ENCOUNTER — Encounter: Payer: Self-pay | Admitting: Nurse Practitioner

## 2023-07-10 ENCOUNTER — Inpatient Hospital Stay (HOSPITAL_BASED_OUTPATIENT_CLINIC_OR_DEPARTMENT_OTHER): Payer: Medicare PPO | Admitting: Nurse Practitioner

## 2023-07-10 VITALS — BP 119/78 | HR 73 | Temp 97.8°F | Resp 14

## 2023-07-10 DIAGNOSIS — G893 Neoplasm related pain (acute) (chronic): Secondary | ICD-10-CM | POA: Diagnosis not present

## 2023-07-10 DIAGNOSIS — Z515 Encounter for palliative care: Secondary | ICD-10-CM | POA: Diagnosis not present

## 2023-07-10 DIAGNOSIS — C7931 Secondary malignant neoplasm of brain: Secondary | ICD-10-CM

## 2023-07-10 DIAGNOSIS — R530 Neoplastic (malignant) related fatigue: Secondary | ICD-10-CM | POA: Diagnosis not present

## 2023-07-10 DIAGNOSIS — Z510 Encounter for antineoplastic radiation therapy: Secondary | ICD-10-CM | POA: Diagnosis not present

## 2023-07-10 LAB — RAD ONC ARIA SESSION SUMMARY
Course Elapsed Days: 6
Plan Fractions Treated to Date: 5
Plan Prescribed Dose Per Fraction: 2.5 Gy
Plan Total Fractions Prescribed: 10
Plan Total Prescribed Dose: 25 Gy
Reference Point Dosage Given to Date: 12.5 Gy
Reference Point Session Dosage Given: 2.5 Gy
Session Number: 5

## 2023-07-11 ENCOUNTER — Other Ambulatory Visit: Payer: Self-pay

## 2023-07-11 ENCOUNTER — Ambulatory Visit
Admission: RE | Admit: 2023-07-11 | Discharge: 2023-07-11 | Disposition: A | Discharge status: Home / Self Care | Payer: Medicare PPO | Source: Ambulatory Visit | Attending: Radiation Oncology | Admitting: Radiation Oncology

## 2023-07-11 DIAGNOSIS — Z510 Encounter for antineoplastic radiation therapy: Secondary | ICD-10-CM | POA: Diagnosis not present

## 2023-07-11 LAB — RAD ONC ARIA SESSION SUMMARY
Course Elapsed Days: 7
Plan Fractions Treated to Date: 6
Plan Prescribed Dose Per Fraction: 2.5 Gy
Plan Total Fractions Prescribed: 10
Plan Total Prescribed Dose: 25 Gy
Reference Point Dosage Given to Date: 15 Gy
Reference Point Session Dosage Given: 2.5 Gy
Session Number: 6

## 2023-07-12 ENCOUNTER — Ambulatory Visit
Admission: RE | Admit: 2023-07-12 | Discharge: 2023-07-12 | Disposition: A | Discharge status: Home / Self Care | Payer: Medicare PPO | Source: Ambulatory Visit | Attending: Radiation Oncology | Admitting: Radiation Oncology

## 2023-07-12 ENCOUNTER — Other Ambulatory Visit: Payer: Self-pay

## 2023-07-12 DIAGNOSIS — Z510 Encounter for antineoplastic radiation therapy: Secondary | ICD-10-CM | POA: Diagnosis not present

## 2023-07-12 LAB — RAD ONC ARIA SESSION SUMMARY
Course Elapsed Days: 8
Plan Fractions Treated to Date: 7
Plan Prescribed Dose Per Fraction: 2.5 Gy
Plan Total Fractions Prescribed: 10
Plan Total Prescribed Dose: 25 Gy
Reference Point Dosage Given to Date: 17.5 Gy
Reference Point Session Dosage Given: 2.5 Gy
Session Number: 7

## 2023-07-13 ENCOUNTER — Other Ambulatory Visit: Payer: Self-pay

## 2023-07-13 ENCOUNTER — Ambulatory Visit
Admission: RE | Admit: 2023-07-13 | Discharge: 2023-07-13 | Disposition: A | Discharge status: Home / Self Care | Payer: Medicare PPO | Source: Ambulatory Visit | Attending: Radiation Oncology | Admitting: Radiation Oncology

## 2023-07-13 DIAGNOSIS — Z510 Encounter for antineoplastic radiation therapy: Secondary | ICD-10-CM | POA: Diagnosis not present

## 2023-07-13 LAB — RAD ONC ARIA SESSION SUMMARY
Course Elapsed Days: 9
Plan Fractions Treated to Date: 8
Plan Prescribed Dose Per Fraction: 2.5 Gy
Plan Total Fractions Prescribed: 10
Plan Total Prescribed Dose: 25 Gy
Reference Point Dosage Given to Date: 20 Gy
Reference Point Session Dosage Given: 2.5 Gy
Session Number: 8

## 2023-07-16 ENCOUNTER — Ambulatory Visit: Payer: Medicare PPO

## 2023-07-16 DIAGNOSIS — Z510 Encounter for antineoplastic radiation therapy: Secondary | ICD-10-CM | POA: Diagnosis not present

## 2023-07-17 ENCOUNTER — Emergency Department (HOSPITAL_COMMUNITY)
Admission: EM | Admit: 2023-07-17 | Discharge: 2023-07-17 | Disposition: A | Discharge status: Home / Self Care | Payer: Medicare PPO | Attending: Emergency Medicine | Admitting: Emergency Medicine

## 2023-07-17 ENCOUNTER — Other Ambulatory Visit: Payer: Self-pay

## 2023-07-17 ENCOUNTER — Emergency Department (HOSPITAL_COMMUNITY): Payer: Medicare PPO

## 2023-07-17 ENCOUNTER — Encounter (HOSPITAL_COMMUNITY): Payer: Self-pay

## 2023-07-17 ENCOUNTER — Ambulatory Visit: Payer: Medicare PPO

## 2023-07-17 DIAGNOSIS — Z79890 Hormone replacement therapy (postmenopausal): Secondary | ICD-10-CM | POA: Diagnosis not present

## 2023-07-17 DIAGNOSIS — Z7901 Long term (current) use of anticoagulants: Secondary | ICD-10-CM | POA: Insufficient documentation

## 2023-07-17 DIAGNOSIS — Z79899 Other long term (current) drug therapy: Secondary | ICD-10-CM | POA: Insufficient documentation

## 2023-07-17 DIAGNOSIS — Z87891 Personal history of nicotine dependence: Secondary | ICD-10-CM | POA: Diagnosis not present

## 2023-07-17 DIAGNOSIS — Z8616 Personal history of COVID-19: Secondary | ICD-10-CM | POA: Diagnosis not present

## 2023-07-17 DIAGNOSIS — E039 Hypothyroidism, unspecified: Secondary | ICD-10-CM | POA: Insufficient documentation

## 2023-07-17 DIAGNOSIS — R569 Unspecified convulsions: Secondary | ICD-10-CM | POA: Diagnosis present

## 2023-07-17 DIAGNOSIS — Z853 Personal history of malignant neoplasm of breast: Secondary | ICD-10-CM | POA: Diagnosis not present

## 2023-07-17 DIAGNOSIS — I10 Essential (primary) hypertension: Secondary | ICD-10-CM | POA: Diagnosis not present

## 2023-07-17 LAB — CBC WITH DIFFERENTIAL/PLATELET
Abs Immature Granulocytes: 0.03 10*3/uL (ref 0.00–0.07)
Basophils Absolute: 0 10*3/uL (ref 0.0–0.1)
Basophils Relative: 0 %
Eosinophils Absolute: 0.2 10*3/uL (ref 0.0–0.5)
Eosinophils Relative: 2 %
HCT: 34.1 % — ABNORMAL LOW (ref 36.0–46.0)
Hemoglobin: 10.9 g/dL — ABNORMAL LOW (ref 12.0–15.0)
Immature Granulocytes: 0 %
Lymphocytes Relative: 15 %
Lymphs Abs: 1.2 10*3/uL (ref 0.7–4.0)
MCH: 32.6 pg (ref 26.0–34.0)
MCHC: 32 g/dL (ref 30.0–36.0)
MCV: 102.1 fL — ABNORMAL HIGH (ref 80.0–100.0)
Monocytes Absolute: 0.5 10*3/uL (ref 0.1–1.0)
Monocytes Relative: 7 %
Neutro Abs: 5.6 10*3/uL (ref 1.7–7.7)
Neutrophils Relative %: 76 %
Platelets: 201 10*3/uL (ref 150–400)
RBC: 3.34 MIL/uL — ABNORMAL LOW (ref 3.87–5.11)
RDW: 15.9 % — ABNORMAL HIGH (ref 11.5–15.5)
WBC: 7.5 10*3/uL (ref 4.0–10.5)
nRBC: 0 % (ref 0.0–0.2)

## 2023-07-17 LAB — URINALYSIS, ROUTINE W REFLEX MICROSCOPIC
Bilirubin Urine: NEGATIVE
Glucose, UA: NEGATIVE mg/dL
Hgb urine dipstick: NEGATIVE
Ketones, ur: NEGATIVE mg/dL
Nitrite: NEGATIVE
Protein, ur: NEGATIVE mg/dL
Specific Gravity, Urine: 1.019 (ref 1.005–1.030)
pH: 5 (ref 5.0–8.0)

## 2023-07-17 LAB — COMPREHENSIVE METABOLIC PANEL
ALT: 33 U/L (ref 0–44)
AST: 23 U/L (ref 15–41)
Albumin: 3.1 g/dL — ABNORMAL LOW (ref 3.5–5.0)
Alkaline Phosphatase: 64 U/L (ref 38–126)
Anion gap: 12 (ref 5–15)
BUN: 28 mg/dL — ABNORMAL HIGH (ref 8–23)
CO2: 27 mmol/L (ref 22–32)
Calcium: 9.3 mg/dL (ref 8.9–10.3)
Chloride: 104 mmol/L (ref 98–111)
Creatinine, Ser: 0.94 mg/dL (ref 0.44–1.00)
GFR, Estimated: >60 mL/min (ref >60)
Glucose, Bld: 129 mg/dL — ABNORMAL HIGH (ref 70–99)
Potassium: 3.6 mmol/L (ref 3.5–5.1)
Sodium: 143 mmol/L (ref 135–145)
Total Bilirubin: 0.6 mg/dL (ref 0.3–1.2)
Total Protein: 6.9 g/dL (ref 6.5–8.1)

## 2023-07-17 LAB — MAGNESIUM: Magnesium: 2 mg/dL (ref 1.7–2.4)

## 2023-07-17 LAB — CBG MONITORING, ED: Glucose-Capillary: 122 mg/dL — ABNORMAL HIGH (ref 70–99)

## 2023-07-17 MED ORDER — LEVETIRACETAM 1000 MG PO TABS: 2000.0000 mg | ORAL_TABLET | Freq: Two times a day (BID) | ORAL | Status: DC

## 2023-07-17 MED ORDER — SODIUM CHLORIDE 0.9 % IV BOLUS
1000.0000 mL | Freq: Once | INTRAVENOUS | Status: AC
  Administered 2023-07-17: 1000 mL via INTRAVENOUS

## 2023-07-17 MED ORDER — GADOBUTROL 1 MMOL/ML IV SOLN
6.0000 mL | Freq: Once | INTRAVENOUS | Status: AC | PRN
Start: 2023-07-17 — End: 2023-07-17
  Administered 2023-07-17: 6 mL via INTRAVENOUS

## 2023-07-17 NOTE — ED Triage Notes (Signed)
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 Hx metastatic breast cancer with mets to brain. Takes Keppra daily and was given 1000mg  by her daughter this morning at the time of the seizure. Family states she is back to her baseline at the time of triage. Patient's only complaint is headache and photosensitivity.

## 2023-07-17 NOTE — Discharge Instructions (Addendum)
We evaluated you for your seizure.  Your brain MRI had no new changes from your previous MRI.  We discussed your symptoms with Dr. Barbaraann Cao.  He recommends taking 2000 mg of Keppra twice daily instead of 1000 mg of Keppra 3 times a day.  He will call you for follow-up later this week.  If you do not hear from him in the next 24 hours please call his office.  If you do have any new symptoms such as worsening amount of seizures, headaches, nausea or vomiting, lightheadedness or dizziness, chest pain, shortness of breath, confusion, weakness, numbness, tingling, or any other new symptoms please return to the emergency department for reassessment.

## 2023-07-17 NOTE — ED Notes (Signed)
Patient transported to MRI 

## 2023-07-17 NOTE — ED Provider Notes (Signed)
[place] EMERGENCY DEPARTMENT AT [facility] Provider Note  CSN: [id number] Arrival date & time: 07/17/23 6387  Chief Complaint(s) Seizures  HPI Tabitha Ramirez is a 77 y.o. 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  Patient reports compliance with her regular Keppra.  Today had left-sided twitching which is her typical seizure, reports spread to the rest of her body.  The patient does not think she lost consciousness.  No fevers or chills.  She reports after the seizure mild headache, light sensitivity.  No loss of vision.  No fevers or chills.  No neck stiffness.  No abdominal or chest pain.  No falls.  No trauma.  Has been getting radiation therapy for brain metastasis.   Past Medical History Past Medical History:  Diagnosis Date   Allergy    Anemia    Arthritis    "joints" (09/29/2013)   Breast cancer (HCC)    GERD (gastroesophageal reflux disease)    Heart murmur    History of blood transfusion    "related to chemo/breast cancer" (09/29/2013)   Hypertension    Migraines    Personal history of chemotherapy    Personal history of radiation therapy    Radiation 11/05/13-12/24/13   Right breast    TMJ (temporomandibular joint syndrome)    "left; just dx'd" (09/29/2013   Patient Active Problem List   Diagnosis Date Noted   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   Facial twitching 06/22/2023   COVID-19 05/25/2023   Seizures (HCC) 04/03/2023   Hypothyroidism 04/03/2023   Metastatic cancer to brain (HCC) 04/13/2022   Brain tumor (HCC) 03/17/2022   Palliative care encounter    Brain mass 03/09/2022   Seizure (HCC) 03/09/2022   Neuropathy 06/11/2014   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   Gait abnormality 01/26/2014   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   Neuropathic pain 01/20/2014   Anxiety 01/20/2014   Deficiency  anemia 01/20/2014   Hx of neutropenia 08/04/2013   Jaw pain 08/04/2013   Leukocytosis 04/27/2013   Pericardial effusion 04/27/2013   Malignant neoplasm of female breast (HCC) 02/04/2013   Need for influenza vaccination 11/29/2011   HLD (hyperlipidemia) 03/07/2011   Post-menopausal 03/07/2011   Anemia 12/17/2010   Heart murmur 12/17/2010   Seasonal allergies 12/17/2010   HYPOKALEMIA 02/19/2009   Essential hypertension, benign 04/09/2008   Home Medication(s) Prior to Admission medications   Medication Sig Start Date End Date Taking? Authorizing Provider  acetaminophen (TYLENOL) 325 MG tablet Take 1-2 tablets (325-650 mg total) by mouth every 6 (six) hours as needed for mild pain. Patient taking differently: Take 650 mg by mouth every 6 (six) hours as needed for mild pain (pain score 1-3) or moderate pain (pain score 4-6). 02/13/23  Yes Elgergawy, Leana Roe, MD  anastrozole (ARIMIDEX) 1 MG tablet TAKE 1 TABLET(1 MG) BY MOUTH DAILY Patient taking differently: Take 1 mg by mouth daily. 04/26/23  Yes Serena Croissant, MD  dexamethasone (DECADRON) 4 MG tablet Take 1 tablet (4 mg total) by mouth daily. Follow up with Dr. Barbaraann Cao to get refills. 06/26/23  Yes Philomena Doheny, MD  feeding supplement (ENSURE ENLIVE / ENSURE PLUS) LIQD Take 237 mLs by mouth 2 (two) times daily between meals. 06/26/23  Yes Philomena Doheny, MD  ferrous sulfate 325 (65 FE) MG tablet Take 325 mg by mouth daily.   Yes [provider]  lacosamide 100 MG TABS  Take 1 tablet (100 mg total) by mouth 2 (two) times daily. 06/26/23  Yes Philomena Doheny, MD  levothyroxine (SYNTHROID) 100 MCG tablet Take 100 mcg by mouth daily before breakfast.   Yes [provider]  LORazepam (ATIVAN) 0.5 MG tablet Take 1 tablet (0.5 mg total) by mouth daily as needed for seizure. 05/25/23  Yes Rocky Morel, DO  ondansetron (ZOFRAN-ODT) 4 MG disintegrating tablet Take 4 mg by mouth every 6 (six) hours as  needed. 07/14/23  Yes [provider]  oxyCODONE-acetaminophen (PERCOCET/ROXICET) 5-325 MG tablet Take 1 tablet by mouth 4 (four) times daily as needed. 07/14/23  Yes [provider]  potassium chloride (KLOR-CON) 10 MEQ tablet Take 10 mEq by mouth daily. 03/10/22  Yes [provider]  rivaroxaban (XARELTO) 10 MG TABS tablet Take 1 tablet (10 mg total) by mouth daily. 06/26/23 07/26/23 Yes Gwenevere Abbot, MD  rosuvastatin (CRESTOR) 10 MG tablet Take 1 tablet (10 mg total) by mouth daily. Patient taking differently: Take 10 mg by mouth every evening. 04/26/22  Yes Jones Bales, NP  triamterene-hydrochlorothiazide (MAXZIDE-25) 37.5-25 MG tablet Take 1 tablet by mouth daily. 03/24/23  Yes [provider]  vitamin B-12 (CYANOCOBALAMIN) 500 MCG tablet Take 500 mcg by mouth daily.   Yes [provider]  levETIRAcetam (KEPPRA) 1000 MG tablet Take 2 tablets (2,000 mg total) by mouth 2 (two) times daily. 07/17/23   Lonell Grandchild, MD                                                                                                                                    Past Surgical History Past Surgical History:  Procedure Laterality Date   AXILLARY LYMPH NODE BIOPSY Right 03/10/2013   Procedure: AXILLARY LYMPH NODE BIOPSY;  Surgeon: Ernestene Mention, MD;  Location: [facility] OR;  Service: General;  Laterality: Right;  sentinel node with blue dye   BREAST BIOPSY     BREAST LUMPECTOMY Right 09/29/2013   needle localization w/axillary LND/notes 09/29/2013   BREAST LUMPECTOMY WITH NEEDLE LOCALIZATION AND AXILLARY LYMPH NODE DISSECTION Right 09/29/2013   Procedure: RIGHT BREAST NEEDLE LOCALIZED LUMPECTOMY AND AXILLARY LYMPH NODE DISSECTION;  Surgeon: Ernestene Mention, MD;  Location: [facility] OR;  Service: General;  Laterality: Right;   COLONOSCOPY N/A 04/30/2013   Procedure: COLONOSCOPY;  Surgeon: Graylin Shiver, MD;  Location: [facility] ENDOSCOPY;  Service: Endoscopy;  Laterality: N/A;    CRANIOTOMY Right 03/11/2022   Procedure: CRANIOTOMY TUMOR EXCISION;  Surgeon: Venetia Night, MD;  Location: [facility] ORS;  Service: Neurosurgery;  Laterality: Right;   ESOPHAGOGASTRODUODENOSCOPY N/A 04/28/2013   Procedure: ESOPHAGOGASTRODUODENOSCOPY (EGD);  Surgeon: Graylin Shiver, MD;  Location: [facility]COPY;  Service: Endoscopy;  Laterality: N/A;   MASTECTOMY Right 09/29/2013   "partial"   PORT-A-CATH REMOVAL  09/29/2013   PORT-A-CATH REMOVAL Left 09/29/2013   Procedure: REMOVAL PORT-A-CATH;  Surgeon: Ernestene Mention, MD;  Location: [facility] OR;  Service: General;  Laterality: Left;   PORTACATH PLACEMENT N/A 03/10/2013   Procedure: INSERTION PORT-A-CATH WITH FLUOROSCOPY AND ULTRASOUND;  Surgeon: Ernestene Mention, MD;  Location: [facility] OR;  Service: General;  Laterality: N/A;   SURAL NERVE BX Right 05/13/2014   Procedure: SURAL NERVE BIOPSY;  Surgeon: Hewitt Shorts, MD;  Location: [facility] NEURO ORS;  Service: Neurosurgery;  Laterality: Right;  Right sural nerve biopsy   TONSILLECTOMY     TOTAL ABDOMINAL HYSTERECTOMY     With bilateral salpingo-oophorectomy   Family History Family History  Problem Relation Age of Onset   Cancer Mother 46       Unknown type of cancer   Congestive Heart Failure Mother    Breast cancer Mother    Colon cancer Brother    Diabetes Brother 71   Heart disease Brother        AMI 05/22/2012   Congestive Heart Failure Brother    Cancer Maternal Grandmother 42       Unknown type of cancer   Breast cancer Maternal Grandmother     Social History Social History   Tobacco Use   Smoking status: Former    Types: Cigarettes   Smokeless tobacco: Never   Tobacco comments:    smoked in college 1 yr   Vaping Use   Vaping status: Never Used  Substance Use Topics   Alcohol use: Yes    Comment: Wine occasionally   Drug use: No   Allergies Penicillins and Nsaids  Review of Systems Review of Systems  All other systems reviewed and are negative.   Physical Exam Vital Signs   I have reviewed the triage vital signs BP 122/62   Pulse 69   Temp 98.2 F (36.8 C) (Oral)   Resp 19   SpO2 100%  Physical Exam Vitals and nursing note reviewed.  Constitutional:      General: She is not in acute distress.    Appearance: She is well-developed.  HENT:     Head: Normocephalic and atraumatic.     Mouth/Throat:     Mouth: Mucous membranes are moist.  Eyes:     Extraocular Movements: Extraocular movements intact.     Conjunctiva/sclera: Conjunctivae normal.     Pupils: Pupils are equal, round, and reactive to light.  Cardiovascular:     Rate and Rhythm: Normal rate and regular rhythm.     Heart sounds: No murmur heard. Pulmonary:     Effort: Pulmonary effort is normal. No respiratory distress.     Breath sounds: Normal breath sounds.  Abdominal:     General: Abdomen is flat.     Palpations: Abdomen is soft.     Tenderness: There is no abdominal tenderness.  Musculoskeletal:        General: No tenderness.     Right lower leg: No edema.     Left lower leg: No edema.  Skin:    General: Skin is warm and dry.  Neurological:     General: No focal deficit present.     Mental Status: She is alert. Mental status is at baseline.     Comments: Cranial nerves II through XII intact, strength 5 out of 5 in the bilateral upper and lower extremities, no sensory deficit to light touch  Psychiatric:        Mood and Affect: Mood normal.        Behavior: Behavior normal.     ED Results and Treatments Labs (all labs ordered are listed, but  only abnormal results are displayed) Labs Reviewed  COMPREHENSIVE METABOLIC PANEL - Abnormal; Notable for the following components:      Result Value   Glucose, Bld 129 (*)    BUN 28 (*)    Albumin 3.1 (*)    All other components within normal limits  CBC WITH DIFFERENTIAL/PLATELET - Abnormal; Notable for the following components:   RBC 3.34 (*)    Hemoglobin 10.9 (*)    HCT 34.1 (*)    MCV 102.1 (*)    RDW 15.9 (*)    All  other components within normal limits  URINALYSIS, ROUTINE W REFLEX MICROSCOPIC - Abnormal; Notable for the following components:   Leukocytes,Ua SMALL (*)    Bacteria, UA RARE (*)    All other components within normal limits  CBG MONITORING, ED - Abnormal; Notable for the following components:   Glucose-Capillary 122 (*)    All other components within normal limits  MAGNESIUM  LEVETIRACETAM LEVEL                                                                                                                          Radiology MR Brain W and Wo Contrast  Result Date: 07/17/2023 CLINICAL DATA:  Seizure, new-onset, no history of trauma Brain metastases, assess treatment response. EXAM: MRI HEAD WITHOUT AND WITH CONTRAST TECHNIQUE: Multiplanar, multiecho pulse sequences of the brain and surrounding structures were obtained without and with intravenous contrast. CONTRAST:  6mL GADAVIST GADOBUTROL 1 MMOL/ML IV SOLN COMPARISON:  MRI brain 06/20/2023. FINDINGS: Brain: Unchanged dural-based enhancement overlying the right parietal resection cavity, measuring up to 18 mm in thickness (coronal image 12 series 13). Unchanged nodular enhancement extending anteriorly from the inferior aspect of the right parietal resection cavity (axial image 51 series 12). No new sites of abnormal enhancement. Unchanged extent of surrounding T2 hyperintensity in the subcortical white matter, likely a mix of vasogenic edema and radiation gliosis. No acute infarct or hemorrhage. Stable background of moderate chronic small-vessel disease. No hydrocephalus, extra-axial collection or midline shift. Vascular: Normal flow voids and vessel enhancement. Skull and upper cervical spine: Prior right parietal craniotomy. Sinuses/Orbits: Fluid in the right maxillary sinus. Orbits are unremarkable. Other: None. IMPRESSION: 1. Unchanged dural-based enhancement overlying the right parietal resection cavity, measuring up to 18 mm in thickness.  Unchanged nodular enhancement extending anteriorly from the inferior aspect of the right parietal resection cavity. No new sites of intracranial metastasis. 2. Unchanged extent of surrounding T2 hyperintensity in the subcortical white matter, likely a mix of vasogenic edema and radiation gliosis. Electronically Signed   By: Orvan Falconer M.D.   On: 07/17/2023 13:56   DG Chest Portable 1 View  Result Date: 07/17/2023 CLINICAL DATA:  Weakness.  History of breast cancer EXAM: PORTABLE CHEST 1 VIEW COMPARISON:  X-ray 05/21/2023.  CT scan 06/25/2023 FINDINGS: Multiple surgical clips along the right hemithorax and axillary region. No consolidation, pneumothorax or effusion. Normal cardiopericardial silhouette without edema. Curvature of the spine.  Overlapping cardiac leads. The nodular opacity seen on CT scan in the lungs are not well defined on this portable one view chest x-ray. Please correlate with the prior CT report IMPRESSION: No acute cardiopulmonary disease. Please correlate with the prior CT report describing lung nodules. Electronically Signed   By: Karen Kays M.D.   On: 07/17/2023 13:15    Pertinent labs & imaging results that were available during my care of the patient were reviewed by me and considered in my medical decision making (see MDM for details).  Medications Ordered in ED Medications  sodium chloride 0.9 % bolus 1,000 mL (1,000 mLs Intravenous New Bag/Given 07/17/23 1109)  gadobutrol (GADAVIST) 1 MMOL/ML injection 6 mL (6 mLs Intravenous Contrast Given 07/17/23 1337)                                                                                                                                     Procedures Procedures  (including critical care time)  Medical Decision Making / ED Course   MDM:  77 year old presenting to the emergency department with seizure.  Patient well-appearing, physical exam with reassuring neurologic exam.  Pupil exam reassuring.  Visual fields  intact.  Symptoms concerning for recurrent seizure.  Unclear whether had a tonic-clonic seizure she reports she did not lose consciousness.  She has been getting radiation therapy for the tumor.  She does report a headache.  Will obtain recurrent MRI to evaluate for any acute changes or worsening of disease.  Will check visual acuity given photophobia but seems related to seizure.  Doubt any acute eye issues such as glaucoma.  Pupils are reactive.  No trauma.  No focal infectious symptoms we will check urinalysis and chest x-ray.  Clinical Course as of 07/17/23 1515  Tue Jul 17, 2023  1454 Visual acuity normal. MRI brain without acute changes.  Labs overall reassuring.  Discussed with patient's neuro oncologist Dr. Barbaraann Cao.  Discussed MRI results.  Discussed clinical history.  He is very family with the patient.  He recommends that we increase her Keppra from 1000 3 times daily to 2000 twice daily.  He will call the patient for follow-up later this week with him in his office.  Discussed with the patient and the family and they feel comfortable with this plan. Will discharge patient to home. All questions answered. Patient comfortable with plan of discharge. Return precautions discussed with patient and specified on the after visit summary.  [WS]    Clinical Course User Index [WS] Suezanne Jacquet, Jerilee Field, MD     Additional history obtained: -Additional history obtained from family -External records from outside source obtained and reviewed including: Chart review including previous notes, labs, imaging, consultation notes including prior oncology notes    Lab Tests: -I ordered, reviewed, and interpreted labs.   The pertinent results include:   Labs Reviewed  COMPREHENSIVE METABOLIC PANEL - Abnormal; Notable for the following components:  Result Value   Glucose, Bld 129 (*)    BUN 28 (*)    Albumin 3.1 (*)    All other components within normal limits  CBC WITH DIFFERENTIAL/PLATELET -  Abnormal; Notable for the following components:   RBC 3.34 (*)    Hemoglobin 10.9 (*)    HCT 34.1 (*)    MCV 102.1 (*)    RDW 15.9 (*)    All other components within normal limits  URINALYSIS, ROUTINE W REFLEX MICROSCOPIC - Abnormal; Notable for the following components:   Leukocytes,Ua SMALL (*)    Bacteria, UA RARE (*)    All other components within normal limits  CBG MONITORING, ED - Abnormal; Notable for the following components:   Glucose-Capillary 122 (*)    All other components within normal limits  MAGNESIUM  LEVETIRACETAM LEVEL    Notable for reassuring results, normal electrolytes, no hypoglycemia    Imaging Studies ordered: I ordered imaging studies including MRI brain On my interpretation imaging demonstrates no acute changes from prior  I independently visualized and interpreted imaging. I agree with the radiologist interpretation   Medicines ordered and prescription drug management: Meds ordered this encounter  Medications   sodium chloride 0.9 % bolus 1,000 mL   gadobutrol (GADAVIST) 1 MMOL/ML injection 6 mL   levETIRAcetam (KEPPRA) 1000 MG tablet    Sig: Take 2 tablets (2,000 mg total) by mouth 2 (two) times daily.    -I have reviewed the patients home medicines and have made adjustments as needed   Consultations Obtained: I requested consultation with the patient's neuro-oncologist,  and discussed lab and imaging findings as well as pertinent plan - they recommend: increase keppra dose   Cardiac Monitoring: The patient was maintained on a cardiac monitor.  I personally viewed and interpreted the cardiac monitored which showed an underlying rhythm of: NSR  Reevaluation: After the interventions noted above, I reevaluated the patient and found that their symptoms have improved  Co morbidities that complicate the patient evaluation  Past Medical History:  Diagnosis Date   Allergy    Anemia    Arthritis    "joints" (09/29/2013)   Breast cancer (HCC)     GERD (gastroesophageal reflux disease)    Heart murmur    History of blood transfusion    "related to chemo/breast cancer" (09/29/2013)   Hypertension    Migraines    Personal history of chemotherapy    Personal history of radiation therapy    Radiation 11/05/13-12/24/13   Right breast    TMJ (temporomandibular joint syndrome)    "left; just dx'd" (09/29/2013      Dispostion: Disposition decision including need for hospitalization was considered, and patient discharged from emergency department.    Final Clinical Impression(s) / ED Diagnoses Final diagnoses:  Seizure [facility])     This chart was dictated using voice recognition software.  Despite best efforts to proofread,  errors can occur which can change the documentation meaning.    Lonell Grandchild, MD 07/17/23 1515

## 2023-07-18 ENCOUNTER — Ambulatory Visit
Admission: RE | Admit: 2023-07-18 | Discharge: 2023-07-18 | Disposition: A | Discharge status: Home / Self Care | Payer: Medicare PPO | Source: Ambulatory Visit | Attending: Radiation Oncology | Admitting: Radiation Oncology

## 2023-07-18 ENCOUNTER — Ambulatory Visit: Payer: Medicare PPO

## 2023-07-18 ENCOUNTER — Other Ambulatory Visit: Payer: Self-pay

## 2023-07-18 DIAGNOSIS — Z510 Encounter for antineoplastic radiation therapy: Secondary | ICD-10-CM | POA: Diagnosis not present

## 2023-07-18 LAB — LEVETIRACETAM LEVEL: Levetiracetam Lvl: 85.4 ug/mL — ABNORMAL HIGH (ref 10.0–40.0)

## 2023-07-18 LAB — RAD ONC ARIA SESSION SUMMARY
Course Elapsed Days: 14
Plan Fractions Treated to Date: 9
Plan Prescribed Dose Per Fraction: 2.5 Gy
Plan Total Fractions Prescribed: 10
Plan Total Prescribed Dose: 25 Gy
Reference Point Dosage Given to Date: 22.5 Gy
Reference Point Session Dosage Given: 2.5 Gy
Session Number: 9

## 2023-07-19 ENCOUNTER — Other Ambulatory Visit: Payer: Self-pay

## 2023-07-19 ENCOUNTER — Ambulatory Visit
Admission: RE | Admit: 2023-07-19 | Discharge: 2023-07-19 | Disposition: A | Discharge status: Home / Self Care | Payer: Medicare PPO | Source: Ambulatory Visit | Attending: Radiation Oncology | Admitting: Radiation Oncology

## 2023-07-19 ENCOUNTER — Ambulatory Visit: Payer: Medicare PPO

## 2023-07-19 ENCOUNTER — Telehealth: Payer: Self-pay | Admitting: Internal Medicine

## 2023-07-19 DIAGNOSIS — Z510 Encounter for antineoplastic radiation therapy: Secondary | ICD-10-CM | POA: Diagnosis not present

## 2023-07-19 LAB — RAD ONC ARIA SESSION SUMMARY
Course Elapsed Days: 15
Plan Fractions Treated to Date: 10
Plan Prescribed Dose Per Fraction: 2.5 Gy
Plan Total Fractions Prescribed: 10
Plan Total Prescribed Dose: 25 Gy
Reference Point Dosage Given to Date: 25 Gy
Reference Point Session Dosage Given: 2.5 Gy
Session Number: 10

## 2023-07-19 NOTE — Telephone Encounter (Signed)
Unable to leave patient a message on main contact line due to phone number currently being out of service, called emergency contact Hulan Saas and left a voicemail regarding patient's appointment times/dates

## 2023-07-20 ENCOUNTER — Ambulatory Visit: Payer: Medicare PPO

## 2023-07-23 ENCOUNTER — Inpatient Hospital Stay: Payer: Medicare PPO | Admitting: Internal Medicine

## 2023-07-23 ENCOUNTER — Encounter: Payer: Self-pay | Admitting: Internal Medicine

## 2023-07-23 ENCOUNTER — Ambulatory Visit: Payer: Medicare PPO

## 2023-07-24 ENCOUNTER — Inpatient Hospital Stay: Payer: Medicare PPO

## 2023-07-24 ENCOUNTER — Emergency Department (HOSPITAL_COMMUNITY): Payer: Medicare PPO

## 2023-07-24 ENCOUNTER — Other Ambulatory Visit: Payer: Self-pay | Admitting: Internal Medicine

## 2023-07-24 ENCOUNTER — Ambulatory Visit: Payer: Medicare PPO

## 2023-07-24 ENCOUNTER — Encounter (HOSPITAL_COMMUNITY): Payer: Self-pay | Admitting: Radiology

## 2023-07-24 ENCOUNTER — Inpatient Hospital Stay (HOSPITAL_COMMUNITY)
Admission: EM | Admit: 2023-07-24 | Discharge: 2023-07-30 | DRG: 100 | Disposition: A | Discharge status: Skilled Nursing Facility | Payer: Medicare PPO | Source: Ambulatory Visit | Attending: Internal Medicine | Admitting: Internal Medicine

## 2023-07-24 ENCOUNTER — Inpatient Hospital Stay: Payer: Medicare PPO | Admitting: Internal Medicine

## 2023-07-24 ENCOUNTER — Other Ambulatory Visit: Payer: Self-pay

## 2023-07-24 VITALS — BP 110/75 | HR 87 | Temp 97.7°F | Resp 17 | Ht 63.0 in

## 2023-07-24 DIAGNOSIS — C50111 Malignant neoplasm of central portion of right female breast: Secondary | ICD-10-CM | POA: Insufficient documentation

## 2023-07-24 DIAGNOSIS — S0083XA Contusion of other part of head, initial encounter: Secondary | ICD-10-CM | POA: Diagnosis not present

## 2023-07-24 DIAGNOSIS — Z9221 Personal history of antineoplastic chemotherapy: Secondary | ICD-10-CM | POA: Diagnosis not present

## 2023-07-24 DIAGNOSIS — I10 Essential (primary) hypertension: Secondary | ICD-10-CM | POA: Diagnosis not present

## 2023-07-24 DIAGNOSIS — R569 Unspecified convulsions: Secondary | ICD-10-CM

## 2023-07-24 DIAGNOSIS — Z87891 Personal history of nicotine dependence: Secondary | ICD-10-CM

## 2023-07-24 DIAGNOSIS — G40201 Localization-related (focal) (partial) symptomatic epilepsy and epileptic syndromes with complex partial seizures, not intractable, with status epilepticus: Secondary | ICD-10-CM | POA: Diagnosis not present

## 2023-07-24 DIAGNOSIS — Z880 Allergy status to penicillin: Secondary | ICD-10-CM

## 2023-07-24 DIAGNOSIS — C7931 Secondary malignant neoplasm of brain: Secondary | ICD-10-CM | POA: Insufficient documentation

## 2023-07-24 DIAGNOSIS — D509 Iron deficiency anemia, unspecified: Secondary | ICD-10-CM | POA: Diagnosis not present

## 2023-07-24 DIAGNOSIS — Z79890 Hormone replacement therapy (postmenopausal): Secondary | ICD-10-CM | POA: Diagnosis not present

## 2023-07-24 DIAGNOSIS — D649 Anemia, unspecified: Secondary | ICD-10-CM | POA: Diagnosis present

## 2023-07-24 DIAGNOSIS — Z803 Family history of malignant neoplasm of breast: Secondary | ICD-10-CM | POA: Diagnosis not present

## 2023-07-24 DIAGNOSIS — Z79899 Other long term (current) drug therapy: Secondary | ICD-10-CM

## 2023-07-24 DIAGNOSIS — Z833 Family history of diabetes mellitus: Secondary | ICD-10-CM | POA: Diagnosis not present

## 2023-07-24 DIAGNOSIS — Z7901 Long term (current) use of anticoagulants: Secondary | ICD-10-CM | POA: Diagnosis not present

## 2023-07-24 DIAGNOSIS — Z853 Personal history of malignant neoplasm of breast: Secondary | ICD-10-CM

## 2023-07-24 DIAGNOSIS — K219 Gastro-esophageal reflux disease without esophagitis: Secondary | ICD-10-CM | POA: Diagnosis present

## 2023-07-24 DIAGNOSIS — E039 Hypothyroidism, unspecified: Secondary | ICD-10-CM | POA: Diagnosis not present

## 2023-07-24 DIAGNOSIS — Z886 Allergy status to analgesic agent status: Secondary | ICD-10-CM

## 2023-07-24 DIAGNOSIS — Z515 Encounter for palliative care: Secondary | ICD-10-CM | POA: Diagnosis not present

## 2023-07-24 DIAGNOSIS — G40901 Epilepsy, unspecified, not intractable, with status epilepticus: Principal | ICD-10-CM

## 2023-07-24 DIAGNOSIS — Z923 Personal history of irradiation: Secondary | ICD-10-CM | POA: Diagnosis not present

## 2023-07-24 DIAGNOSIS — E876 Hypokalemia: Secondary | ICD-10-CM | POA: Diagnosis not present

## 2023-07-24 DIAGNOSIS — D638 Anemia in other chronic diseases classified elsewhere: Secondary | ICD-10-CM | POA: Diagnosis present

## 2023-07-24 DIAGNOSIS — G936 Cerebral edema: Secondary | ICD-10-CM | POA: Diagnosis present

## 2023-07-24 DIAGNOSIS — G8929 Other chronic pain: Secondary | ICD-10-CM | POA: Diagnosis not present

## 2023-07-24 DIAGNOSIS — E785 Hyperlipidemia, unspecified: Secondary | ICD-10-CM | POA: Diagnosis not present

## 2023-07-24 DIAGNOSIS — Y92230 Patient room in hospital as the place of occurrence of the external cause: Secondary | ICD-10-CM | POA: Diagnosis not present

## 2023-07-24 DIAGNOSIS — W06XXXA Fall from bed, initial encounter: Secondary | ICD-10-CM | POA: Diagnosis not present

## 2023-07-24 DIAGNOSIS — Z8249 Family history of ischemic heart disease and other diseases of the circulatory system: Secondary | ICD-10-CM

## 2023-07-24 DIAGNOSIS — C50911 Malignant neoplasm of unspecified site of right female breast: Secondary | ICD-10-CM | POA: Diagnosis present

## 2023-07-24 DIAGNOSIS — Z66 Do not resuscitate: Secondary | ICD-10-CM | POA: Diagnosis present

## 2023-07-24 DIAGNOSIS — C50919 Malignant neoplasm of unspecified site of unspecified female breast: Secondary | ICD-10-CM | POA: Diagnosis present

## 2023-07-24 DIAGNOSIS — Z90710 Acquired absence of both cervix and uterus: Secondary | ICD-10-CM

## 2023-07-24 LAB — COMPREHENSIVE METABOLIC PANEL
ALT: 40 U/L (ref 0–44)
AST: 32 U/L (ref 15–41)
Albumin: 3.4 g/dL — ABNORMAL LOW (ref 3.5–5.0)
Alkaline Phosphatase: 61 U/L (ref 38–126)
Anion gap: 11 (ref 5–15)
BUN: 27 mg/dL — ABNORMAL HIGH (ref 8–23)
CO2: 27 mmol/L (ref 22–32)
Calcium: 9.5 mg/dL (ref 8.9–10.3)
Chloride: 103 mmol/L (ref 98–111)
Creatinine, Ser: 1.12 mg/dL — ABNORMAL HIGH (ref 0.44–1.00)
GFR, Estimated: 51 mL/min — ABNORMAL LOW (ref >60)
Glucose, Bld: 103 mg/dL — ABNORMAL HIGH (ref 70–99)
Potassium: 3.4 mmol/L — ABNORMAL LOW (ref 3.5–5.1)
Sodium: 141 mmol/L (ref 135–145)
Total Bilirubin: 0.4 mg/dL (ref 0.3–1.2)
Total Protein: 7.5 g/dL (ref 6.5–8.1)

## 2023-07-24 LAB — CBC WITH DIFFERENTIAL/PLATELET
Abs Immature Granulocytes: 0.05 10*3/uL (ref 0.00–0.07)
Basophils Absolute: 0 10*3/uL (ref 0.0–0.1)
Basophils Relative: 0 %
Eosinophils Absolute: 0.2 10*3/uL (ref 0.0–0.5)
Eosinophils Relative: 2 %
HCT: 33.4 % — ABNORMAL LOW (ref 36.0–46.0)
Hemoglobin: 11 g/dL — ABNORMAL LOW (ref 12.0–15.0)
Immature Granulocytes: 1 %
Lymphocytes Relative: 15 %
Lymphs Abs: 1.4 10*3/uL (ref 0.7–4.0)
MCH: 33.6 pg (ref 26.0–34.0)
MCHC: 32.9 g/dL (ref 30.0–36.0)
MCV: 102.1 fL — ABNORMAL HIGH (ref 80.0–100.0)
Monocytes Absolute: 0.8 10*3/uL (ref 0.1–1.0)
Monocytes Relative: 9 %
Neutro Abs: 6.5 10*3/uL (ref 1.7–7.7)
Neutrophils Relative %: 73 %
Platelets: 297 10*3/uL (ref 150–400)
RBC: 3.27 MIL/uL — ABNORMAL LOW (ref 3.87–5.11)
RDW: 14.3 % (ref 11.5–15.5)
WBC: 9 10*3/uL (ref 4.0–10.5)
nRBC: 0 % (ref 0.0–0.2)

## 2023-07-24 LAB — CBG MONITORING, ED: Glucose-Capillary: 79 mg/dL (ref 70–99)

## 2023-07-24 LAB — MAGNESIUM: Magnesium: 2.1 mg/dL (ref 1.7–2.4)

## 2023-07-24 MED ORDER — ACETAMINOPHEN 650 MG RE SUPP: 650.0000 mg | Freq: Four times a day (QID) | RECTAL | Status: DC | PRN

## 2023-07-24 MED ORDER — DEXAMETHASONE 4 MG PO TABS
4.0000 mg | ORAL_TABLET | Freq: Every day | ORAL | Status: DC
  Administered 2023-07-25 – 2023-07-30 (×6): 4 mg via ORAL
  Filled 2023-07-24 (×6): qty 1

## 2023-07-24 MED ORDER — LEVOTHYROXINE SODIUM 100 MCG PO TABS
100.0000 ug | ORAL_TABLET | Freq: Every day | ORAL | Status: DC
  Administered 2023-07-26 – 2023-07-30 (×5): 100 ug via ORAL
  Filled 2023-07-24 (×6): qty 1

## 2023-07-24 MED ORDER — ENOXAPARIN SODIUM 30 MG/0.3ML IJ SOSY: 30.0000 mg | PREFILLED_SYRINGE | INTRAMUSCULAR | Status: DC

## 2023-07-24 MED ORDER — ACETAMINOPHEN 325 MG PO TABS
650.0000 mg | ORAL_TABLET | Freq: Four times a day (QID) | ORAL | Status: DC | PRN
  Administered 2023-07-28: 650 mg via ORAL
  Filled 2023-07-24: qty 2

## 2023-07-24 MED ORDER — LORAZEPAM 2 MG/ML IJ SOLN
2.0000 mg | Freq: Once | INTRAMUSCULAR | Status: AC
  Administered 2023-07-24: 2 mg via INTRAMUSCULAR

## 2023-07-24 MED ORDER — TRIAMTERENE-HCTZ 37.5-25 MG PO TABS
1.0000 | ORAL_TABLET | Freq: Every day | ORAL | Status: DC
Start: 2023-07-25 — End: 2023-07-30
  Administered 2023-07-25 – 2023-07-29 (×5): 1 via ORAL
  Filled 2023-07-24 (×6): qty 1

## 2023-07-24 MED ORDER — LEVETIRACETAM 750 MG PO TABS
2000.0000 mg | ORAL_TABLET | Freq: Two times a day (BID) | ORAL | Status: DC
  Administered 2023-07-24 – 2023-07-30 (×12): 2000 mg via ORAL
  Filled 2023-07-24 (×5): qty 1
  Filled 2023-07-24: qty 4
  Filled 2023-07-24 (×4): qty 1
  Filled 2023-07-24: qty 4
  Filled 2023-07-24 (×2): qty 1

## 2023-07-24 MED ORDER — RIVAROXABAN 10 MG PO TABS
10.0000 mg | ORAL_TABLET | Freq: Every day | ORAL | Status: DC
  Administered 2023-07-25 – 2023-07-30 (×6): 10 mg via ORAL
  Filled 2023-07-24 (×5): qty 1

## 2023-07-24 MED ORDER — LACOSAMIDE 50 MG PO TABS
150.0000 mg | ORAL_TABLET | Freq: Two times a day (BID) | ORAL | Status: DC
  Administered 2023-07-24 – 2023-07-30 (×12): 150 mg via ORAL
  Filled 2023-07-24 (×12): qty 3

## 2023-07-24 MED ORDER — ROSUVASTATIN CALCIUM 5 MG PO TABS
10.0000 mg | ORAL_TABLET | Freq: Every evening | ORAL | Status: DC
  Administered 2023-07-24 – 2023-07-29 (×6): 10 mg via ORAL
  Filled 2023-07-24 (×6): qty 2

## 2023-07-24 MED ORDER — OXYCODONE-ACETAMINOPHEN 5-325 MG PO TABS
1.0000 | ORAL_TABLET | Freq: Four times a day (QID) | ORAL | Status: DC | PRN
  Administered 2023-07-25 – 2023-07-29 (×3): 1 via ORAL
  Filled 2023-07-24 (×3): qty 1

## 2023-07-24 NOTE — ED Triage Notes (Signed)
Pt brought over from cancer center after having 4 witnessed seizures around 1300 today  Pt was given 2mg  IM ativan. 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  PA and family denies fall or any other injury at time of seizures.   Currently undergoing radiation treatment only  Pt family member at bedside and states pt is compliant with seizure medications

## 2023-07-24 NOTE — Procedures (Signed)
Patient Name: Tabitha Ramirez  MRN: [id number]  Epilepsy Attending: [name]  Referring Physician/Provider: Melene Plan, DO  Date: 07/24/2023 Duration: 23.35 mins  Patient history: 77yo F presented following 4 witnessed, clustered, complex partial breakthrough seizures. EEG to evaluate for seizure  Level of alertness: Awake  AEDs during EEG study: LEV, LCM  Technical aspects: This EEG study was done with scalp electrodes positioned according to the 10-20 International system of electrode placement. Electrical activity was reviewed with band pass filter of 1-70Hz , sensitivity of 7 uV/mm, display speed of 74mm/sec with a 60Hz  notched filter applied as appropriate. EEG data were recorded continuously and digitally stored.  Video monitoring was available and reviewed as appropriate.  Description:  EEG showed continuous generalized 3 to 6 Hz theta-delta slowing. Additionally there was 12-14 Hz beta activity in right parietal region consistent with breach artifact.  Hyperventilation and photic stimulation were not performed.     ABNORMALITY - Breach artifact, right parietal region -Continuous slow, generalized   IMPRESSION: This study is suggestive of cortical dysfunction arising from  right parietal region consistent with underlying craniotomy. Additionally there is moderate diffuse encephalopathy. No seizures or epileptiform discharges were seen throughout the recording.  Vannessa God[place] [name]

## 2023-07-24 NOTE — Progress Notes (Signed)
While pt was in office, she had several seizures. Per Dr.Vaslow, administered 2 mg of Ativan IM. Pt was then transported to ED for further evaluation.

## 2023-07-24 NOTE — Progress Notes (Signed)
LTM EEG running - no initial skin breakdown - push button tested - neuro notified.  

## 2023-07-24 NOTE — Progress Notes (Signed)
EEG complete - results pending 

## 2023-07-24 NOTE — ED Notes (Signed)
EEG in process

## 2023-07-24 NOTE — H&P (Incomplete)
Date: 07/25/2023               Patient Name:  Tabitha Ramirez MRN: [id number]  DOB: 1946/06/16 Age / Sex: 77 y.o., female   PCP: [facility]         Medical Service: Internal Medicine Teaching Service         Attending Physician: Dr. Inez Catalina, MD      First Contact: Dr. Peterson Ao, MD  Pager [phone]     Second Contact: Dr. Marrianne Mood, MD Pager [phone]         After Hours (After 5p/  First Contact Pager: [phone]  weekends / holidays): Second Contact Pager: [phone]   SUBJECTIVE   Chief Complaint: seizures   History of Present Illness:  Tabitha Ramirez is a 77 y.o. 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   [name] not at the bedside. We reached out to her by phone to provide more history.  Patient saw her oncologist, Dr. Liana Gerold clinic today 10/29.([facility] ). At the clinic she had 4 back-to-back episodes.  Each episode lasted anywhere between 30 seconds to 2 minutes.  Last one lasted >5 minutes.  Episodes were described as patient having generalized shaking and head tilting forward. At baseline patient able to carry on a conversation. She has not had any recent illnesses per daughter.  Patient has been taking her medications as prescribed. She has slightly decreased appetite due to radiation side effect.  Presented to the ED at [facility], and was transferred to [facility] to get EEG.  At home she has had poor functional status, non ambulatory, relying on aid with ADL's from her daughter.  Otherwise she denies recent illness, fevers, chills, chest pain, sob, abd pain, n/v/d, dysuria, hematuria.     Past Medical History  Past Medical History:  Diagnosis Date   Allergy    Anemia    Arthritis    "joints" (09/29/2013)   Breast cancer (HCC)    GERD  (gastroesophageal reflux disease)    Heart murmur    History of blood transfusion    "related to chemo/breast cancer" (09/29/2013)   Hypertension    Migraines    Personal history of chemotherapy    Personal history of radiation therapy    Radiation 11/05/13-12/24/13   Right breast    TMJ (temporomandibular joint syndrome)    "left; just dx'd" (09/29/2013     Meds:  anastrozole 1 mg daily  Vitamin D3 2000u daily Decadron 4 mg daily Ferrous sulfate 325 mg daily Lacosamide 100 mg BID Keppra 2000 mg bid Synthroid 100 mcg Ativan 0.5 mg PRN seizures Percocet 5-325 mg PRN - has not taken in 3 days Kclor 10 mEq daily Xarelto 10 mg daily - unsure why  Crestor 10 mg at bedtime Triamterene-hydrochlorothiazide 37.5-25 mg daily Vit b12 500 mcg daily Ensure BID  Zofran 4 mg    Past Surgical History  Past Surgical History:  Procedure Laterality Date   AXILLARY LYMPH NODE BIOPSY Right 03/10/2013   Procedure: AXILLARY LYMPH NODE BIOPSY;  Surgeon: Ernestene Mention, MD;  Location: [facility] OR;  Service: General;  Laterality: Right;  sentinel node with blue dye   BREAST BIOPSY     BREAST LUMPECTOMY Right 09/29/2013   needle localization w/axillary LND/notes 09/29/2013   BREAST LUMPECTOMY WITH NEEDLE LOCALIZATION AND  AXILLARY LYMPH NODE DISSECTION Right 09/29/2013   Procedure: RIGHT BREAST NEEDLE LOCALIZED LUMPECTOMY AND AXILLARY LYMPH NODE DISSECTION;  Surgeon: Ernestene Mention, MD;  Location: [facility] OR;  Service: General;  Laterality: Right;   COLONOSCOPY N/A 04/30/2013   Procedure: COLONOSCOPY;  Surgeon: Graylin Shiver, MD;  Location: [facility] ENDOSCOPY;  Service: Endoscopy;  Laterality: N/A;   CRANIOTOMY Right 03/11/2022   Procedure: CRANIOTOMY TUMOR EXCISION;  Surgeon: Venetia Night, MD;  Location: [facility] ORS;  Service: Neurosurgery;  Laterality: Right;   ESOPHAGOGASTRODUODENOSCOPY N/A 04/28/2013   Procedure: ESOPHAGOGASTRODUODENOSCOPY (EGD);  Surgeon: Graylin Shiver, MD;  Location: [facility] ENDOSCOPY;  Service: Endoscopy;   Laterality: N/A;   MASTECTOMY Right 09/29/2013   "partial"   PORT-A-CATH REMOVAL  09/29/2013   PORT-A-CATH REMOVAL Left 09/29/2013   Procedure: REMOVAL PORT-A-CATH;  Surgeon: Ernestene Mention, MD;  Location: [facility] OR;  Service: General;  Laterality: Left;   PORTACATH PLACEMENT N/A 03/10/2013   Procedure: INSERTION PORT-A-CATH WITH FLUOROSCOPY AND ULTRASOUND;  Surgeon: Ernestene Mention, MD;  Location: [facility] OR;  Service: General;  Laterality: N/A;   SURAL NERVE BX Right 05/13/2014   Procedure: SURAL NERVE BIOPSY;  Surgeon: Hewitt Shorts, MD;  Location: [facility] NEURO ORS;  Service: Neurosurgery;  Laterality: Right;  Right sural nerve biopsy   TONSILLECTOMY     TOTAL ABDOMINAL HYSTERECTOMY     With bilateral salpingo-oophorectomy    Social:  Lives with her daughter for 1 year Support: Daughter Level of Function:Unable to ambulate on her own, uses wheelchair Dependent of ADLs PCP: [place]  Family History:   Allergies: Allergies as of 07/24/2023 - Review Complete 07/24/2023  Allergen Reaction Noted   Penicillins Rash 04/09/2008   Nsaids Nausea And Vomiting and Other (See Comments) 10/22/2017    Review of Systems: A complete ROS was negative except as per HPI.   OBJECTIVE:   Physical Exam: Blood pressure 124/82, pulse 67, temperature 97.9 F (36.6 C), temperature source Oral, resp. rate 19, height 5\' 3"  (1.6 m), weight 54 kg, SpO2 100%.  Constitutional: Chronically ill looking, not in acute distress. EEG electrodes attached to head. Eyes: Sclera non-icteric, PERRL, EOM intact Cardio:Regular rate and rhythm. No murmurs, rubs, or gallops. 2+ bilateral radial pulses. Pulm:Clear to auscultation bilaterally. Normal work of breathing on room air. Abdomen: Soft, non-tender, non-distended, positive bowel sounds. ONG:EXBMWUXL for extremity edema. Skin:Warm and dry. Neuro: Alert and oriented to person, place and month, but not year.  Cranial nerves II-XII intact.  Generalized  weakness in bilateral upper and lower extremities, but no focal deficit. Speaks in short sentences with hyponoic speech.  Psych:Pleasant mood and affect.  Labs: CBC    Component Value Date/Time   WBC 9.0 07/24/2023 1540   RBC 3.27 (L) 07/24/2023 1540   HGB 11.0 (L) 07/24/2023 1540   HGB 10.7 (L) 06/22/2014 1107   HCT 33.4 (L) 07/24/2023 1540   HCT 32.5 (L) 06/22/2014 1107   PLT 297 07/24/2023 1540   PLT 284 06/22/2014 1107   MCV 102.1 (H) 07/24/2023 1540   MCV 102.2 (H) 06/22/2014 1107   MCH 33.6 07/24/2023 1540   MCHC 32.9 07/24/2023 1540   RDW 14.3 07/24/2023 1540   RDW 14.7 (H) 06/22/2014 1107   LYMPHSABS 1.4 07/24/2023 1540   LYMPHSABS 2.4 06/22/2014 1107   MONOABS 0.8 07/24/2023 1540   MONOABS 0.7 06/22/2014 1107   EOSABS 0.2 07/24/2023 1540   EOSABS 0.2 06/22/2014 1107   BASOSABS 0.0 07/24/2023 1540   BASOSABS 0.0 06/22/2014  1107     CMP     Component Value Date/Time   NA 141 07/24/2023 1540   NA 140 03/30/2015 0944   K 3.4 (L) 07/24/2023 1540   K 3.8 03/30/2015 0944   CL 103 07/24/2023 1540   CL 100 03/13/2013 0918   CO2 27 07/24/2023 1540   CO2 26 03/30/2015 0944   GLUCOSE 103 (H) 07/24/2023 1540   GLUCOSE 103 03/30/2015 0944   GLUCOSE 169 (H) 03/13/2013 0918   BUN 27 (H) 07/24/2023 1540   BUN 18.9 03/30/2015 0944   CREATININE 1.12 (H) 07/24/2023 1540   CREATININE 0.9 03/30/2015 0944   CALCIUM 9.5 07/24/2023 1540   CALCIUM 9.7 03/30/2015 0944   PROT 7.5 07/24/2023 1540   PROT 7.9 03/30/2015 0944   ALBUMIN 3.4 (L) 07/24/2023 1540   ALBUMIN 3.1 (L) 03/30/2015 0944   AST 32 07/24/2023 1540   AST 20 03/30/2015 0944   ALT 40 07/24/2023 1540   ALT 19 03/30/2015 0944   ALKPHOS 61 07/24/2023 1540   ALKPHOS 100 03/30/2015 0944   BILITOT 0.4 07/24/2023 1540   BILITOT 0.45 03/30/2015 0944   GFRNONAA 51 (L) 07/24/2023 1540   GFRNONAA 64 11/29/2011 1535   GFRAA 85 (L) 05/12/2014 1238   GFRAA 74 11/29/2011 1535    Imaging:  CT of the head without  contrast negative for any acute intracranial process.   ASSESSMENT & PLAN:   Assessment & Plan by Problem: Principal Problem:   Seizure (HCC) Active Problems:   Essential hypertension, benign   Anemia   HLD (hyperlipidemia)   Malignant neoplasm of female breast (HCC)   Cancer of right breast metastatic to brain [facility])   Hypothyroidism   Tabitha Ramirez is a 77 y.o. 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  While at the oncologist office on 10/29, she had a cluster of 4 focal seizures characterized by left-sided posturing left gaze deviation and impaired awareness.  She had one seizure lasting > 5 mins. Seizure was aborted with  2mg  IM ativan. She denies any recent trauma, neck stiffness. On BMP, K mildly low at 3.4.  Otherwise WBCs, blood glucose, and other electrolytes normal.  MRI 09/24 which showed recurrent enhancing tumor in the parietal lobe. CT A/P 9/30 last MRI was in 09/24,new subpleural nodular opacity of the left lower lobe that may be pulmonary nodule vs focal atelectasis . CT of the head without contrast was negative for an acute intracranial process. Evidence of an extra-axial metastatic disease along the right parietal lobe as evidenced on the MRI on 09/24.   Patient's prognosis remains poor, and  will be hard to control the seizures. ED physician attempted goals of care discussion with daughter, who seemed reasonable.  We also spoke with the daughter on phone, and she is open to conversation with palliative tomorrow.  Patient was seen by neurology, appreciate their recs.  - Overnight EEG  monitoring - Keppra level and vimpat level to confirm adherence / absorption  - Continue Keppra at 2,000 mg BID  - Increase Vimpat to 150 mg BID - Continue Dexamethasone 4 mg daily - Seizure precautions - 2 mg  IV Ativan STAT for seizure activity lasting > 5 minutes and notify neurology - We have consulted palliative, patient daughter is open to conversation with palliative.  # Metastatic breast cancer.  Invasive ductal carcinoma grade 3 diagnosed in 01/2013.  Status post neoadjuvant chemotherapy, lumpectomy and radiation therapy in 2014-2015. On 06/23, patient had strokelike symptoms; left facial droop, left leg drift and decreased sensation apraxia.  MRI at that time showed irregular enhancing mass in the right parietal lobe.  She underwent craniotomy with tumor resection, with pathology showing metastatic carcinoma compatible with patient's known breast cancer.  She is currently getting whole brain radiation therapy.  Patient's oncologist, Dr. Barbaraann Cao also recommended Xarelto 10 mg for anticoagulation.   -Palliative consulted. -Anastrozole 1 mg. -Xarelto 10 mg.  Anemia of chronic disease. Stable.  Hb 11.  -Monitor.  -CBC  Chronic stable medical conditions  # Hypertension -Triamterene-HCTZ 37.5-25 mg  # Hyperlipidemia -Crestor 10 mg.  # Hypothyroidism -Synthroid 100 mcg.  # Hypokalemia -Kclor  # Nausea -Zofran  # Chronic pain Percocet 5-325 mg PRN - has not taken in 3 days   Diet: NPO VTE: DOAC IVF: None,None Code: DNR/DNI  Dispo: Admit patient to Observation with expected length of stay less than 2 midnights.  Signed:  Laretta Bolster, M.D.  Internal Medicine Resident, PGY-1 [facility]  Pager: [phone] 4:58 AM, 07/25/2023   **Please contact the on call pager after 5 pm and on weekends at [phone].**

## 2023-07-24 NOTE — ED Notes (Signed)
Pt visitor came to station to inform that pt had had another seizure. States it lasted about 35m, pt was twitching and not responding, like earlier today. Reports that before today, pt is usually still able to talk during them. MD informed

## 2023-07-24 NOTE — Progress Notes (Signed)
Hookup in ED, atrium NOT monitoring. HU charge captured

## 2023-07-24 NOTE — Progress Notes (Signed)
Pt requested glue not be used. Due to pt request and ST RT eeg results, paste, tape, head wrap used. Will monitor in office to maintain electrode integrity.

## 2023-07-24 NOTE — ED Notes (Signed)
Pt family at bedside. Pt resting comfortably in bed. Pt has no needs at this time. Will continue to monitor. Bed locked and in lowest position. Call light within reach. Vital Signs WNL.

## 2023-07-24 NOTE — ED Provider Notes (Signed)
I assumed care of the patient.  Transferred from [facility].  Patient with breast cancer with metastasis to the brain.  Having more frequent seizures.  Plan to admit to medicine here.  I discussed with the internal medicine teaching service for admission.   Melene Plan, DO 07/24/23 1935

## 2023-07-24 NOTE — Hospital Course (Addendum)
Principal Problem:   Seizure (HCC) Active Problems:   Essential hypertension, benign   Anemia   HLD (hyperlipidemia)   Malignant neoplasm of female breast (HCC)   Cancer of right breast metastatic to brain [facility])   Hypothyroidism  Resolved Problems:   * No resolved hospital problems. *  07/27/2023 Chills, cold, HA, feels foggy.  A & O x3    Consults:  -neurology: continue increased Vimpat 150 mg BID, Keppra 2000 mg BID and Nayzilam as a rescue drug for seizures   -palliative: has outpatient services established already   Procedures: EEG   Tabitha Ramirez is a 77 y.o. 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   While at the oncologist office on 10/29, she had a cluster of 4 focal seizures characterized by left-sided posturing left gaze deviation and impaired awareness.  Multiple seizures were treated with 2 mg IM Ativan at the clinic. She was transferred to [facility] ED CT head w/o acute process, right parietal resection cavity and extra axial metastatic disease along the right parietal region consistent with MRI obtained on 07/17/23. Neurology consulted and increased home vimpat to 150 mg BID and continue home keppra at 2000 mg BID. Pt has poor prognosis and will be hard to control the seizures per neuro. EEG w/ moderate diffuse encephalopathy and cortical dysfucntion consistent with craniotomy site. EEG was discontinued after 48 hours. Patient orientated x4 this morning. soft spoken, which is new per the daughter. Patient previously at [facility] rehab facility in October 2024 after recurrent  seizures and transitioned to live with them. PT recommending acute inpatient rehab after evaluation today. TOC consult placed, to consider CIR vs skilled nursing facility. Inpatient rehab evaluated and was unsure of what they could offer the patient. The patient was discharged to *** .    # Metastatic breast cancer.  Invasive ductal carcinoma grade 3 diagnosed in 01/2013.  Status post neoadjuvant chemotherapy, lumpectomy and radiation therapy in 2014-2015. On 06/23, patient had strokelike symptoms; left facial droop, left leg drift and decreased sensation apraxia.  MRI at that time showed irregular enhancing mass in the right parietal lobe.  She underwent craniotomy with tumor resection, with pathology showing metastatic carcinoma compatible with patient's known breast cancer.  She is currently getting whole brain palliative radiation therapy.  Patient's oncologist, Dr. Barbaraann Cao also recommended Xarelto 10 mg for anticoagulation. Patient continued home anastrozole 1 mg daily during admission. Palliative had a goals of care conversation, while admitted with the daughter. Patient has palliative services established outpatient and they would be interested in skilled nursing facility placement at time of discharge if warranted. Patient was transported to her palliative radiation sessions throughout admission.     Anemia of chronic disease. Pt with history and admission 11.1 hgb. Hgb was stable throughout admission and without overt signs of bleeding. She continued home ferrous sulfate daily    Chronic stable medical conditions   # Hypertension -Patients BP was controlled on home medication is Triamterene-HCTZ 37.5-25 mg.   # Hyperlipidemia Patient with past history and continued her home medication of Crestor 10 mg daily     # Hypothyroidism  Patient with history and continued home medication of synthroid 100 mcg    # Hypokalemia (resolved)  Patient 3.4 on admission. Takes potassium supplementation at  home kclor 10 mEQ. Lytes were checked periodically and replenished as needed.    # Nausea Patient had Zofran 4 mg available as needed.    # Chronic pain Patients pain was controlled and she continued home Percocet 5-325 mg PRN

## 2023-07-24 NOTE — ED Notes (Signed)
ED TO INPATIENT HANDOFF REPORT  ED Nurse Name and Phone #: [phone], Hollie Bartus  S Name/Age/Gender [name] 77 y.o. female Room/Bed: 026C/026C  Code Status   Code Status: Limited: Do not attempt resuscitation (DNR) -DNR-LIMITED -Do Not Intubate/DNI   Home/SNF/Other Home Patient oriented to: self, place, time, and situation Is this baseline? Yes   Triage Complete: Triage complete  Chief Complaint Seizure [facility]) [R56.9]  Triage Note Pt brought over from cancer center after having 4 witnessed seizures around 1300 today  Pt was given 2mg  IM ativan. 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  PA and family denies fall or any other injury at time of seizures.   Currently undergoing radiation treatment only  Pt family member at bedside and states pt is compliant with seizure medications    Allergies Allergies  Allergen Reactions   Penicillins Rash    Face swelling   Nsaids Nausea And Vomiting and Other (See Comments)    Pt states stomach ulcers, vomiting, and bleeding.    Level of Care/Admitting Diagnosis ED Disposition     ED Disposition  Admit   Condition  --   Comment  Hospital Area: [facility] [[id number]]  Level of Care: Telemetry Medical [104]  May place patient in observation at [facility] or [facility] if equivalent level of care is available:: No  Covid Evaluation: Asymptomatic - no recent exposure (last 10 days) testing not required  Diagnosis: Seizure (HCC) [[id number]]  Admitting Physician: Inez Catalina [phone]  Attending Physician: Nena Polio          B Medical/Surgery History Past Medical History:  Diagnosis Date   Allergy    Anemia    Arthritis    "joints" (09/29/2013)   Breast cancer (HCC)    GERD (gastroesophageal reflux disease)    Heart murmur    History of blood transfusion    "related to chemo/breast cancer" (09/29/2013)   Hypertension    Migraines    Personal  history of chemotherapy    Personal history of radiation therapy    Radiation 11/05/13-12/24/13   Right breast    TMJ (temporomandibular joint syndrome)    "left; just dx'd" (09/29/2013   Past Surgical History:  Procedure Laterality Date   AXILLARY LYMPH NODE BIOPSY Right 03/10/2013   Procedure: AXILLARY LYMPH NODE BIOPSY;  Surgeon: Ernestene Mention, MD;  Location: [facility] OR;  Service: General;  Laterality: Right;  sentinel node with blue dye   BREAST BIOPSY     BREAST LUMPECTOMY Right 09/29/2013   needle localization w/axillary LND/notes 09/29/2013   BREAST LUMPECTOMY WITH NEEDLE LOCALIZATION AND AXILLARY LYMPH NODE DISSECTION Right 09/29/2013   Procedure: RIGHT BREAST NEEDLE LOCALIZED LUMPECTOMY AND AXILLARY LYMPH NODE DISSECTION;  Surgeon: Ernestene Mention, MD;  Location: [facility] OR;  Service: General;  Laterality: Right;   COLONOSCOPY N/A 04/30/2013   Procedure: COLONOSCOPY;  Surgeon: Graylin Shiver, MD;  Location: [facility] ENDOSCOPY;  Service: Endoscopy;  Laterality: N/A;   CRANIOTOMY Right 03/11/2022   Procedure: CRANIOTOMY TUMOR EXCISION;  Surgeon: Venetia Night, MD;  Location: [facility] ORS;  Service: Neurosurgery;  Laterality: Right;   ESOPHAGOGASTRODUODENOSCOPY N/A 04/28/2013   Procedure: ESOPHAGOGASTRODUODENOSCOPY (EGD);  Surgeon: Graylin Shiver, MD;  Location: [facility];  Service: Endoscopy;  Laterality: N/A;   MASTECTOMY Right 09/29/2013   "partial"   PORT-A-CATH REMOVAL  09/29/2013   PORT-A-CATH REMOVAL Left 09/29/2013   Procedure: REMOVAL PORT-A-CATH;  Surgeon: Angelia Mould  Derrell Lolling, MD;  Location: [facility] OR;  Service: General;  Laterality: Left;   PORTACATH PLACEMENT N/A 03/10/2013   Procedure: INSERTION PORT-A-CATH WITH FLUOROSCOPY AND ULTRASOUND;  Surgeon: Ernestene Mention, MD;  Location: [facility] OR;  Service: General;  Laterality: N/A;   SURAL NERVE BX Right 05/13/2014   Procedure: SURAL NERVE BIOPSY;  Surgeon: Hewitt Shorts, MD;  Location: [facility] NEURO ORS;  Service: Neurosurgery;  Laterality: Right;  Right sural nerve  biopsy   TONSILLECTOMY     TOTAL ABDOMINAL HYSTERECTOMY     With bilateral salpingo-oophorectomy     A IV Location/Drains/Wounds Patient Lines/Drains/Airways Status     Active Line/Drains/Airways     Name Placement date Placement time Site Days   Peripheral IV 07/24/23 20 G Left Antecubital 07/24/23  --  Antecubital  less than 1            Intake/Output Last 24 hours No intake or output data in the 24 hours ending 07/24/23 2226  Labs/Imaging Results for orders placed or performed during the hospital encounter of 07/24/23 (from the past 48 hour(s))  Comprehensive metabolic panel     Status: Abnormal   Collection Time: 07/24/23  3:40 PM  Result Value Ref Range   Sodium 141 135 - 145 mmol/L   Potassium 3.4 (L) 3.5 - 5.1 mmol/L   Chloride 103 98 - 111 mmol/L   CO2 27 22 - 32 mmol/L   Glucose, Bld 103 (H) 70 - 99 mg/dL    Comment: Glucose reference range applies only to samples taken after fasting for at least 8 hours.   BUN 27 (H) 8 - 23 mg/dL   Creatinine, Ser 7.82 (H) 0.44 - 1.00 mg/dL   Calcium 9.5 8.9 - 95.6 mg/dL   Total Protein 7.5 6.5 - 8.1 g/dL   Albumin 3.4 (L) 3.5 - 5.0 g/dL   AST 32 15 - 41 U/L   ALT 40 0 - 44 U/L   Alkaline Phosphatase 61 38 - 126 U/L   Total Bilirubin 0.4 0.3 - 1.2 mg/dL   GFR, Estimated 51 (L) >60 mL/min    Comment: (NOTE) Calculated using the CKD-EPI Creatinine Equation (2021)    Anion gap 11 5 - 15    Comment: Performed at [facility], [address], [place], [place] [address]  CBC with Differential/Platelet     Status: Abnormal   Collection Time: 07/24/23  3:40 PM  Result Value Ref Range   WBC 9.0 4.0 - 10.5 K/uL   RBC 3.27 (L) 3.87 - 5.11 MIL/uL   Hemoglobin 11.0 (L) 12.0 - 15.0 g/dL   HCT 65.7 (L) 84.6 - 96.2 %   MCV 102.1 (H) 80.0 - 100.0 fL   MCH 33.6 26.0 - 34.0 pg   MCHC 32.9 30.0 - 36.0 g/dL   RDW 95.2 84.1 - 32.4 %   Platelets 297 150 - 400 K/uL   nRBC 0.0 0.0 - 0.2 %   Neutrophils Relative % 73  %   Neutro Abs 6.5 1.7 - 7.7 K/uL   Lymphocytes Relative 15 %   Lymphs Abs 1.4 0.7 - 4.0 K/uL   Monocytes Relative 9 %   Monocytes Absolute 0.8 0.1 - 1.0 K/uL   Eosinophils Relative 2 %   Eosinophils Absolute 0.2 0.0 - 0.5 K/uL   Basophils Relative 0 %   Basophils Absolute 0.0 0.0 - 0.1 K/uL   Immature Granulocytes 1 %   Abs Immature Granulocytes 0.05 0.00 - 0.07 K/uL  Comment: Performed at [facility], [address], [place], [place] [address]  Magnesium     Status: None   Collection Time: 07/24/23  3:40 PM  Result Value Ref Range   Magnesium 2.1 1.7 - 2.4 mg/dL    Comment: Performed at [facility], [address], [place], [place] [address]  CBG monitoring, ED     Status: None   Collection Time: 07/24/23  3:54 PM  Result Value Ref Range   Glucose-Capillary 79 70 - 99 mg/dL    Comment: Glucose reference range applies only to samples taken after fasting for at least 8 hours.   *Note: Due to a large number of results and/or encounters for the requested time period, some results have not been displayed. A complete set of results can be found in Results Review.   CT Head Wo Contrast  Result Date: 07/24/2023 CLINICAL DATA:  Multiple seizures, metastatic breast cancer EXAM: CT HEAD WITHOUT CONTRAST TECHNIQUE: Contiguous axial images were obtained from the base of the skull through the vertex without intravenous contrast. RADIATION DOSE REDUCTION: This exam was performed according to the departmental dose-optimization program which includes automated exposure control, adjustment of the mA and/or kV according to patient size and/or use of iterative reconstruction technique. COMPARISON:  06/21/2023 CT head, correlation is also made with 07/17/2023 MRI head FINDINGS: Brain: Redemonstrated right parietal resection cavity, which appears unchanged from the prior exam. Previously described tumor in the extra-axial space along the right parietal right  cerebral convexity is not as well seen on CT as it is on MRI (series 5, image 46), but it appears slightly decreased in size compared to the prior CT, although this is likely unchanged from the more recent MRI. No acute parenchymal hemorrhage, infarct, or new mass. No hydrocephalus or new extra-axial collection. Vascular: No hyperdense vessel. Atherosclerotic calcifications in the intracranial carotid and vertebral arteries. Skull: Right parietal craniotomy. Negative for fracture or focal lesion. Sinuses/Orbits: Mucosal thickening in the right maxillary sinus. No acute finding in the orbits. Other: The mastoid air cells are well aerated. IMPRESSION: 1. No acute intracranial process.  No acute hemorrhage or infarct. 2. Redemonstrated right parietal resection cavity and extra-axial metastatic disease along the right parietal right cerebral convexity, which is not as well seen on CT as it is on MRI. An MRI with and without contrast is recommended if further evaluation is indicated at this time. Electronically Signed   By: Wiliam Ke M.D.   On: 07/24/2023 17:56    Pending Labs Unresulted Labs (From admission, onward)     Start     Ordered   07/25/23 0500  Basic metabolic panel  Tomorrow morning,   R        07/24/23 2146   07/25/23 0500  CBC  Tomorrow morning,   R        07/24/23 2146   07/24/23 1459  Lacosamide  ONCE - URGENT,   URGENT        07/24/23 1458   07/24/23 1459  Levetiracetam level  ONCE - URGENT,   URGENT        07/24/23 1458            Vitals/Pain Today's Vitals   07/24/23 1530 07/24/23 1620 07/24/23 1900 07/24/23 2037  BP: 117/70 117/70 124/82   Pulse: 69 69 67   Resp: (!) 24 (!) 24 19   Temp:  97.8 F (36.6 C)  97.9 F (36.6 C)  TempSrc:  Oral  SpO2: 100% 99% 100%   Weight:      Height:      PainSc:        Isolation Precautions No active isolations  Medications Medications  levETIRAcetam (KEPPRA) tablet 2,000 mg (2,000 mg Oral Given 07/24/23 2151)   lacosamide (VIMPAT) tablet 150 mg (has no administration in time range)  enoxaparin (LOVENOX) injection 30 mg (has no administration in time range)  acetaminophen (TYLENOL) tablet 650 mg (has no administration in time range)    Or  acetaminophen (TYLENOL) suppository 650 mg (has no administration in time range)    Mobility walks     Focused Assessments PT A&O. PT denies any pain.   R Recommendations: See Admitting Provider Note  Report given to:   Additional Notes: Call for further information if needed

## 2023-07-24 NOTE — ED Provider Notes (Signed)
[place] EMERGENCY DEPARTMENT AT [facility] Provider Note   CSN: [id number] Arrival date & time: 07/24/23  1308     History  Chief Complaint  Patient presents with   Seizures    Tabitha Ramirez is a 77 y.o. female.  HPI    77 year old female comes in with chief complaint of seizure-like activity.  She is accompanied by daughter, was at the bedside.  The daughter provides substantial part of the history.  Patient has known history of metastatic breast cancer with mets to the brain.  She is status post resection of the tumor and is currently getting radiation therapy.  Patient has been having seizures and she is on Keppra and Vimpat for it.  The Keppra was increased during her last visit.  Daughter indicates that patient is compliant with medications.  Today patient went to Dr. Liana Gerold clinic.  At the clinic she had 4 back-to-back episodes.  Each episode lasted anywhere between 30 seconds to 2 minutes.  Episodes are described as patient having generalized shaking and head tilting forward.  At baseline patient able to carry on a conversation.  She has not had any recent illnesses per daughter.  Patient has been taking her medications as prescribed.  Due to radiation, her appetite is slightly worse than usual.  Home Medications Prior to Admission medications   Medication Sig Start Date End Date Taking? Authorizing Provider  acetaminophen (TYLENOL) 325 MG tablet Take 1-2 tablets (325-650 mg total) by mouth every 6 (six) hours as needed for mild pain. Patient taking differently: Take 650 mg by mouth every 6 (six) hours as needed for mild pain (pain score 1-3) or moderate pain (pain score 4-6). 02/13/23   Elgergawy, Leana Roe, MD  anastrozole (ARIMIDEX) 1 MG tablet TAKE 1 TABLET(1 MG) BY MOUTH DAILY Patient taking differently: Take 1 mg by mouth daily. 04/26/23   Serena Croissant, MD  Cholecalciferol (VITAMIN D3) 10 MCG (400 UNIT) CHEW Chew 2,000 Units by mouth daily.    [provider]  dexamethasone (DECADRON) 4 MG tablet Take 1 tablet (4 mg total) by mouth daily. Follow up with Dr. Barbaraann Cao to get refills. 06/26/23   Philomena Doheny, MD  feeding supplement (ENSURE ENLIVE / ENSURE PLUS) LIQD Take 237 mLs by mouth 2 (two) times daily between meals. 06/26/23   Philomena Doheny, MD  ferrous sulfate 325 (65 FE) MG tablet Take 325 mg by mouth daily.    [provider]  lacosamide 100 MG TABS Take 1 tablet (100 mg total) by mouth 2 (two) times daily. 06/26/23   Philomena Doheny, MD  levETIRAcetam (KEPPRA) 1000 MG tablet Take 2 tablets (2,000 mg total) by mouth 2 (two) times daily. 07/17/23   Lonell Grandchild, MD  levothyroxine (SYNTHROID) 100 MCG tablet Take 100 mcg by mouth daily before breakfast.    [provider]  LORazepam (ATIVAN) 0.5 MG tablet Take 1 tablet (0.5 mg total) by mouth daily as needed for seizure. 05/25/23   Rocky Morel, DO  ondansetron (ZOFRAN-ODT) 4 MG disintegrating tablet Take 4 mg by mouth every 6 (six) hours as needed. 07/14/23   [provider]  oxyCODONE-acetaminophen (PERCOCET/ROXICET) 5-325 MG tablet Take 1 tablet by mouth 4 (four) times daily as needed. 07/14/23   [provider]  potassium chloride (KLOR-CON) 10 MEQ tablet Take 10 mEq by mouth daily. 03/10/22   [provider]  rivaroxaban (XARELTO) 10 MG TABS tablet Take 1 tablet (10 mg total) by mouth  daily. 06/26/23 07/26/23  Gwenevere Abbot, MD  rosuvastatin (CRESTOR) 10 MG tablet Take 1 tablet (10 mg total) by mouth daily. Patient taking differently: Take 10 mg by mouth every evening. 04/26/22   Jones Bales, NP  triamterene-hydrochlorothiazide (MAXZIDE-25) 37.5-25 MG tablet Take 1 tablet by mouth daily. 03/24/23   [provider]  vitamin B-12 (CYANOCOBALAMIN) 500 MCG tablet Take 500 mcg by mouth daily.    [provider]      Allergies    Penicillins and Nsaids    Review of Systems   Review  of Systems  All other systems reviewed and are negative.   Physical Exam Updated Vital Signs BP 117/70   Pulse 69   Temp 97.8 F (36.6 C) (Oral)   Resp (!) 24   Ht 5\' 3"  (1.6 m)   Wt 54 kg   SpO2 100%   BMI 21.08 kg/m  Physical Exam Vitals and nursing note reviewed.  Constitutional:      Appearance: She is well-developed.     Comments: Somnolent, easily arousable  HENT:     Head: Atraumatic.  Eyes:     Extraocular Movements: Extraocular movements intact.     Pupils: Pupils are equal, round, and reactive to light.     Comments: Pupils are about 2 mm, equal and there is no nystagmus or gaze abnormality appreciated  Cardiovascular:     Rate and Rhythm: Normal rate.  Pulmonary:     Effort: Pulmonary effort is normal.  Musculoskeletal:     Cervical back: Normal range of motion and neck supple.  Skin:    General: Skin is warm and dry.  Neurological:     Comments: Patient is moving all 4 extremities.  She is oriented to self, time but confused about location.     ED Results / Procedures / Treatments   Labs (all labs ordered are listed, but only abnormal results are displayed) Labs Reviewed  COMPREHENSIVE METABOLIC PANEL  CBC WITH DIFFERENTIAL/PLATELET  MAGNESIUM  LACOSAMIDE  LEVETIRACETAM LEVEL  CBG MONITORING, ED    EKG None  Radiology No results found.  Procedures .Critical Care  Performed by: Derwood Kaplan, MD Authorized by: Derwood Kaplan, MD   Critical care provider statement:    Critical care time (minutes):  98   Critical care was necessary to treat or prevent imminent or life-threatening deterioration of the following conditions:  CNS failure or compromise   Critical care was time spent personally by me on the following activities:  Development of treatment plan with patient or surrogate, discussions with consultants, evaluation of patient's response to treatment, examination of patient, ordering and review of laboratory studies, ordering and  review of radiographic studies, ordering and performing treatments and interventions, pulse oximetry, re-evaluation of patient's condition and review of old charts     Medications Ordered in ED Medications - No data to display  ED Course/ Medical Decision Making/ A&P                                 Medical Decision Making Amount and/or Complexity of Data Reviewed Labs: ordered. Radiology: ordered.   This patient presents to the ED with chief complaint(s) of seizure-like activity with pertinent past medical history of metastatic breast cancer with mets to the brain, possible seizures for which she is on Keppra and Vimpat.The complaint involves an extensive differential diagnosis and also carries with it a high risk of complications and  morbidity.    The differential diagnosis includes  -Seizure disorder -Meningitis -Trauma -ICH -Electrolyte abnormality -Metabolic derangement -Stroke -Toxin induced seizures -Medication side effects -Hypoxia -Hypoglycemia  The initial plan is to get basic labs, CT scan of the brain.   Additional history obtained: Additional history obtained from family Records reviewed  previous outpatient visit with neuro-oncology and also reviewed patient's MRI brain with and without contrast that was completed recently.   Treatment and Reassessment: Patient had 2 more seizure-like activities that were witnessed by the daughter.  Each episode lasted about 2 minutes.  Symptoms were similar, patient had generalized shaking and then she dozed off.   I have consulted neurology.  I spoke with Dr. Iver Nestle.  She is recommending that patient be transferred ED to ED for long-term EEG and admission.  She is comfortable with patient not getting any additional medication as long as she is clinically stable.  4:06 PM Spoke with Dr. Iver Nestle and Barbaraann Cao.  Dr. Barbaraann Cao indicated that he saw multiple complex partial seizures.  No changes to dexamethasone for now.  Prognosis  remains poor. Dr. Iver Nestle indicated that it would be hard to control the seizures. I did have goals of care discussion with daughter, who had to leave for child care. She is reasonable. She would prefer admission for monitoring and seeing how her mother does over the next few hours.   Final Clinical Impression(s) / ED Diagnoses Final diagnoses:  Status epilepticus [facility])    Rx / DC Orders ED Discharge Orders     None         Derwood Kaplan, MD 07/24/23 1614

## 2023-07-24 NOTE — Progress Notes (Signed)
Dr Barbaraann Cao asked for patient to be taken to ED, she has had 4 focal seizures during his exam.  Ativan 2 mg given IM R arm.  Report called to ED charge nurse.  Patient taken to room 13 by [facility], escorted by this RN, K. Walisiewicz PA, and patient's daughter.  Patient in stable condition during transport.

## 2023-07-24 NOTE — Progress Notes (Signed)
[facility] [address]  [place], [place] [address] [phone]   Interval Evaluation  Date of Service: 07/24/23 Patient Name: Tabitha Ramirez Patient MRN: [id number] Patient DOB: Jul 16, 1946 Provider: Henreitta Leber, MD  Identifying Statement:  Tabitha Ramirez is a 77 y.o. 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%; sentinel lymph node sampling 03/10/2013 showed1/2 SLN pos PR 3% PR 6% Ki-67 15% HER-2 negative   02/10/2013 Breast MRI   Right breast: Rim enhancing necrotic mass 3.4 cm with a 2 cm level I axillary lymph node   03/17/2013 - 08/25/2013 Neo-Adjuvant Chemotherapy   Dose dense Adriamycin and Cytoxan Followed by weekly Taxol and carboplatin x12; 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 Kathrynn Running.  She did have a seizure and ED visit on 07/17/23, described left sided twitching.  After several days returned to baseline and was discharged following rehab admission.  Keppra was increased to  2000mg  twice per day, along with Vimpat 100mg  twice per day.  At home she has had poor functional status, non ambulatory, relying on aid with ADL's from her daughter.  Otherwise no significant issue with radiation.  Remains on decadron 4mg  daily.      H+P (04/02/23) Patient presents today for follow up after recent admission for seizure.  She experienced an episode of "both arms shaking, lasting for several minutes".  She was not weak after the episode. Keppra was increased to 1000mg  twice per day.  Since the event, no further seizures.  Continues on observation with Dr. Pamelia Hoit for breast cancer.  Medications: Current Outpatient Medications on File Prior to Visit  Medication Sig Dispense Refill   acetaminophen (TYLENOL) 325 MG tablet Take 1-2 tablets (325-650 mg total) by mouth every 6 (six) hours as needed for mild pain. (Patient taking differently: Take 650 mg by mouth every 6 (six) hours as needed for mild pain (pain score 1-3) or moderate pain (pain score 4-6).)     anastrozole (ARIMIDEX) 1 MG tablet TAKE 1 TABLET(1 MG) BY MOUTH DAILY (Patient taking differently: Take 1 mg by mouth daily.) 90 tablet 3   Cholecalciferol (VITAMIN D3) 10 MCG (400 UNIT) CHEW Chew 2,000 Units by mouth daily.     dexamethasone (DECADRON) 4 MG tablet Take 1 tablet (4 mg total) by mouth daily. Follow up with Dr. Barbaraann Cao to get refills.     feeding supplement (ENSURE ENLIVE / ENSURE PLUS) LIQD Take 237 mLs by mouth 2 (two) times daily between meals.     ferrous sulfate 325 (65 FE) MG tablet Take 325 mg by  mouth daily.     lacosamide 100 MG TABS Take 1 tablet (100 mg total) by mouth 2 (two) times daily. 60 tablet 0   levETIRAcetam (KEPPRA) 1000 MG tablet Take 2 tablets (2,000 mg total) by mouth 2 (two) times daily.     levothyroxine (SYNTHROID) 100 MCG tablet Take 100 mcg by mouth daily before breakfast.     LORazepam (ATIVAN) 0.5 MG tablet Take 1 tablet (0.5 mg total) by mouth daily as needed for seizure. 20 tablet 0    ondansetron (ZOFRAN-ODT) 4 MG disintegrating tablet Take 4 mg by mouth every 6 (six) hours as needed.     oxyCODONE-acetaminophen (PERCOCET/ROXICET) 5-325 MG tablet Take 1 tablet by mouth 4 (four) times daily as needed.     potassium chloride (KLOR-CON) 10 MEQ tablet Take 10 mEq by mouth daily.     rivaroxaban (XARELTO) 10 MG TABS tablet Take 1 tablet (10 mg total) by mouth daily.     rosuvastatin (CRESTOR) 10 MG tablet Take 1 tablet (10 mg total) by mouth daily. (Patient taking differently: Take 10 mg by mouth every evening.) 30 tablet 0   triamterene-hydrochlorothiazide (MAXZIDE-25) 37.5-25 MG tablet Take 1 tablet by mouth daily.     vitamin B-12 (CYANOCOBALAMIN) 500 MCG tablet Take 500 mcg by mouth daily.     No current facility-administered medications on file prior to visit.    Allergies:  Allergies  Allergen Reactions   Penicillins Rash    Face swelling   Nsaids Nausea And Vomiting and Other (See Comments)    Pt states stomach ulcers, vomiting, and bleeding.   Past Medical History:  Past Medical History:  Diagnosis Date   Allergy    Anemia    Arthritis    "joints" (09/29/2013)   Breast cancer (HCC)    GERD (gastroesophageal reflux disease)    Heart murmur    History of blood transfusion    "related to chemo/breast cancer" (09/29/2013)   Hypertension    Migraines    Personal history of chemotherapy    Personal history of radiation therapy    Radiation 11/05/13-12/24/13   Right breast    TMJ (temporomandibular joint syndrome)    "left; just dx'd" (09/29/2013   Past Surgical History:  Past Surgical History:  Procedure Laterality Date   AXILLARY LYMPH NODE BIOPSY Right 03/10/2013   Procedure: AXILLARY LYMPH NODE BIOPSY;  Surgeon: Ernestene Mention, MD;  Location: [facility] OR;  Service: General;  Laterality: Right;  sentinel node with blue dye   BREAST BIOPSY     BREAST LUMPECTOMY Right 09/29/2013   needle localization w/axillary LND/notes 09/29/2013   BREAST LUMPECTOMY WITH NEEDLE  LOCALIZATION AND AXILLARY LYMPH NODE DISSECTION Right 09/29/2013   Procedure: RIGHT BREAST NEEDLE LOCALIZED LUMPECTOMY AND AXILLARY LYMPH NODE DISSECTION;  Surgeon: Ernestene Mention, MD;  Location: [facility] OR;  Service: General;  Laterality: Right;   COLONOSCOPY N/A 04/30/2013   Procedure: COLONOSCOPY;  Surgeon: Graylin Shiver, MD;  Location: [facility] ENDOSCOPY;  Service: Endoscopy;  Laterality: N/A;   CRANIOTOMY Right 03/11/2022   Procedure: CRANIOTOMY TUMOR EXCISION;  Surgeon: Venetia Night, MD;  Location: [facility] ORS;  Service: Neurosurgery;  Laterality: Right;   ESOPHAGOGASTRODUODENOSCOPY N/A 04/28/2013   Procedure: ESOPHAGOGASTRODUODENOSCOPY (EGD);  Surgeon: Graylin Shiver, MD;  Location: [facility];  Service: Endoscopy;  Laterality: N/A;   MASTECTOMY Right 09/29/2013   "partial"   PORT-A-CATH REMOVAL  09/29/2013   PORT-A-CATH REMOVAL Left 09/29/2013   Procedure: REMOVAL PORT-A-CATH;  Surgeon: Ernestene Mention, MD;  Location:  [facility] OR;  Service: General;  Laterality: Left;   PORTACATH PLACEMENT N/A 03/10/2013   Procedure: INSERTION PORT-A-CATH WITH FLUOROSCOPY AND ULTRASOUND;  Surgeon: Ernestene Mention, MD;  Location: [facility] OR;  Service: General;  Laterality: N/A;   SURAL NERVE BX Right 05/13/2014   Procedure: SURAL NERVE BIOPSY;  Surgeon: Hewitt Shorts, MD;  Location: [facility] NEURO ORS;  Service: Neurosurgery;  Laterality: Right;  Right sural nerve biopsy   TONSILLECTOMY     TOTAL ABDOMINAL HYSTERECTOMY     With bilateral salpingo-oophorectomy   Social History:  Social History   Socioeconomic History   Marital status: Divorced    Spouse name: n/a   Number of children: 1   Years of education: 17   Highest education level: Not on file  Occupational History   Occupation: retired    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 Currently work as Lawyer   Right handed    Lives with daughter and son in law    Social Determinants of Health   Financial Resource Strain: Not on file  Food Insecurity: No Food Insecurity (06/22/2023)   Hunger Vital Sign    Worried About Running Out of Food in the Last Year: Never true    Ran Out of Food in the Last Year: Never true  Transportation Needs: No Transportation Needs (06/22/2023)   PRAPARE - Administrator, Civil Service (Medical): No    Lack of Transportation (Non-Medical): No  Physical Activity: Not on file  Stress: Not on file  Social Connections: Not on file  Intimate Partner Violence: Not At Risk (06/22/2023)   Humiliation, Afraid, Rape, and Kick questionnaire    Fear of Current or Ex-Partner: No    Emotionally Abused: No    Physically Abused: No    Sexually Abused: No   Family History:  Family History  Problem Relation Age of Onset   Cancer Mother 90       Unknown type of cancer   Congestive Heart Failure Mother    Breast cancer Mother    Colon cancer Brother    Diabetes Brother 50   Heart disease Brother        AMI 05/22/2012   Congestive Heart Failure Brother    Cancer Maternal Grandmother 44       Unknown type of cancer   Breast cancer Maternal Grandmother     Review of Systems: Constitutional: Doesn't report fevers, chills or abnormal weight loss Eyes: Doesn't report blurriness of vision Ears, nose, mouth, throat, and face: Doesn't report sore throat Respiratory: Doesn't report cough, dyspnea or wheezes Cardiovascular: Doesn't report palpitation, chest discomfort  Gastrointestinal:  Doesn't report  nausea, constipation, diarrhea GU: Doesn't report incontinence Skin: Doesn't report skin rashes Neurological: Per HPI Musculoskeletal: Doesn't report joint pain Behavioral/Psych: Doesn't report anxiety  Physical Exam: Vitals:   07/24/23 1202  BP:  110/75  Pulse: 87  Resp: 17  Temp: 97.7 F (36.5 C)  SpO2: 99%   KPS: 60. General: Alert, cooperative, pleasant, in no acute distress Head: Normal EENT: No conjunctival injection or scleral icterus.  Lungs: Resp effort normal Cardiac: Regular rate Abdomen: Non-distended abdomen Skin: No rashes cyanosis or petechiae. Extremities: No clubbing or edema  Neurologic Exam: Mental Status: Awake, alert, attentive to examiner. Oriented to self and environment. Language is fluent with intact comprehension.  Psychomotor slowing, impaired recall. Cranial Nerves: Visual acuity is grossly normal. Visual fields are full. Extra-ocular movements intact. No ptosis. Face is symmetric Motor: Tone and bulk are normal. Power is 4/5 in left arm and leg. Reflexes are symmetric, no pathologic reflexes present.  Sensory: Intact to light touch Gait: Deferred   Labs: I have reviewed the data as listed    Component Value Date/Time   NA 143 07/17/2023 0941   NA 140 03/30/2015 0944   K 3.6 07/17/2023 0941   K 3.8 03/30/2015 0944   CL 104 07/17/2023 0941   CL 100 03/13/2013 0918   CO2 27 07/17/2023 0941   CO2 26 03/30/2015 0944   GLUCOSE 129 (H) 07/17/2023 0941   GLUCOSE 103 03/30/2015 0944   GLUCOSE 169 (H) 03/13/2013 0918   BUN 28 (H) 07/17/2023 0941   BUN 18.9 03/30/2015 0944   CREATININE 0.94 07/17/2023 0941   CREATININE 0.9 03/30/2015 0944   CALCIUM 9.3 07/17/2023 0941   CALCIUM 9.7 03/30/2015 0944   PROT 6.9 07/17/2023 0941   PROT 7.9 03/30/2015 0944   ALBUMIN 3.1 (L) 07/17/2023 0941   ALBUMIN 3.1 (L) 03/30/2015 0944   AST 23 07/17/2023 0941   AST 20 03/30/2015 0944   ALT 33 07/17/2023 0941   ALT 19 03/30/2015 0944   ALKPHOS 64  07/17/2023 0941   ALKPHOS 100 03/30/2015 0944   BILITOT 0.6 07/17/2023 0941   BILITOT 0.45 03/30/2015 0944   GFRNONAA >60 07/17/2023 0941   GFRNONAA 64 11/29/2011 1535   GFRAA 85 (L) 05/12/2014 1238   GFRAA 74 11/29/2011 1535   Lab Results  Component Value Date   WBC 7.5 07/17/2023   NEUTROABS 5.6 07/17/2023   HGB 10.9 (L) 07/17/2023   HCT 34.1 (L) 07/17/2023   MCV 102.1 (H) 07/17/2023   PLT 201 07/17/2023    Imaging:  [facility] Clinician Interpretation: I have personally reviewed the CNS images as listed.  My interpretation, in the context of the patient's clinical presentation, is stable disease  MR Brain W and Wo Contrast  Result Date: 07/17/2023 CLINICAL DATA:  Seizure, new-onset, no history of trauma Brain metastases, assess treatment response. EXAM: MRI HEAD WITHOUT AND WITH CONTRAST TECHNIQUE: Multiplanar, multiecho pulse sequences of the brain and surrounding structures were obtained without and with intravenous contrast. CONTRAST:  6mL GADAVIST GADOBUTROL 1 MMOL/ML IV SOLN COMPARISON:  MRI brain 06/20/2023. FINDINGS: Brain: Unchanged dural-based enhancement overlying the right parietal resection cavity, measuring up to 18 mm in thickness (coronal image 12 series 13). Unchanged nodular enhancement extending anteriorly from the inferior aspect of the right parietal resection cavity (axial image 51 series 12). No new sites of abnormal enhancement. Unchanged extent of surrounding T2 hyperintensity in the subcortical white matter, likely a mix of vasogenic edema and radiation gliosis. No acute infarct or hemorrhage. Stable background of moderate chronic small-vessel disease. No hydrocephalus, extra-axial collection or midline shift. Vascular: Normal flow voids and vessel enhancement. Skull and upper cervical spine: Prior right parietal craniotomy. Sinuses/Orbits: Fluid in the right maxillary  sinus. Orbits are unremarkable. Other: None. IMPRESSION: 1. Unchanged dural-based enhancement  overlying the right parietal resection cavity, measuring up to 18 mm in thickness. Unchanged nodular enhancement extending anteriorly from the inferior aspect of the right parietal resection cavity. No new sites of intracranial metastasis. 2. Unchanged extent of surrounding T2 hyperintensity in the subcortical white matter, likely a mix of vasogenic edema and radiation gliosis. Electronically Signed   By: Orvan Falconer M.D.   On: 07/17/2023 13:56   DG Chest Portable 1 View  Result Date: 07/17/2023 CLINICAL DATA:  Weakness.  History of breast cancer EXAM: PORTABLE CHEST 1 VIEW COMPARISON:  X-ray 05/21/2023.  CT scan 06/25/2023 FINDINGS: Multiple surgical clips along the right hemithorax and axillary region. No consolidation, pneumothorax or effusion. Normal cardiopericardial silhouette without edema. Curvature of the spine. Overlapping cardiac leads. The nodular opacity seen on CT scan in the lungs are not well defined on this portable one view chest x-ray. Please correlate with the prior CT report IMPRESSION: No acute cardiopulmonary disease. Please correlate with the prior CT report describing lung nodules. Electronically Signed   By: Karen Kays M.D.   On: 07/17/2023 13:15   CT CHEST ABDOMEN PELVIS W CONTRAST  Result Date: 06/25/2023 CLINICAL DATA:  Breast cancer; * Tracking Code: BO * EXAM: CT CHEST, ABDOMEN, AND PELVIS WITH CONTRAST TECHNIQUE: Multidetector CT imaging of the chest, abdomen and pelvis was performed following the standard protocol during bolus administration of intravenous contrast. RADIATION DOSE REDUCTION: This exam was performed according to the departmental dose-optimization program which includes automated exposure control, adjustment of the mA and/or kV according to patient size and/or use of iterative reconstruction technique. CONTRAST:  75mL OMNIPAQUE IOHEXOL 350 MG/ML SOLN COMPARISON:  CT chest, abdomen and pelvis dated September 06, 2022 FINDINGS: CT CHEST FINDINGS  Cardiovascular: Normal heart size. Trace pericardial effusion. Normal caliber thoracic aorta with moderate atherosclerotic disease. Severe coronary artery calcifications. Mediastinum/Nodes: Esophagus and thyroid are unremarkable. Surgical clips of the right axilla. No enlarged lymph nodes seen in the chest. Lungs/Pleura: Central airways are patent. Subpleural reticular opacities of the right upper lobe, consistent with postradiation change. Unchanged irregular linear opacities of the right upper lobe, likely due to scarring. New subpleural nodular opacity of the left lower lobe measuring 1.4 x 0.8 cm on series 2, image 47. Additional scattered small solid pulmonary nodules. Reference solid nodule of the right upper lobe measuring 3 mm on series 3, image 42. Musculoskeletal: No aggressive appearing osseous lesions. Levocurvature of the thoracic spine. CT ABDOMEN PELVIS FINDINGS Hepatobiliary: No focal liver abnormality is seen. No gallstones, gallbladder wall thickening, or biliary dilatation. Pancreas: Unremarkable. No pancreatic ductal dilatation or surrounding inflammatory changes. Spleen: Normal in size without focal abnormality. Adrenals/Urinary Tract: Adrenal glands are unremarkable. Kidneys are normal, without renal calculi, focal lesion, or hydronephrosis. Bladder is unremarkable. Stomach/Bowel: Stomach is within normal limits. Appendix appears normal. No evidence of bowel wall thickening, distention, or inflammatory changes. Vascular/Lymphatic: Aortic atherosclerosis. No enlarged abdominal or pelvic lymph nodes. Reproductive: Status post hysterectomy. No adnexal masses. Other: Prior right mastectomy. Unchanged calcified nodule of the right medial breast located on series 2, image 33, likely a fibroadenoma. No abdominopelvic ascites. To ax 70 Musculoskeletal: No aggressive appearing osseous lesions. IMPRESSION: 1. New subpleural nodular opacity of the left lower lobe measuring up to 1.4 cm, could be due to  focal atelectasis, although pulmonary nodule can not be excluded. Recommend short-term follow-up chest CT in 3 months to ensure resolution. 2. No evidence of metastatic  disease in the abdomen or pelvis. 3. Coronary artery calcifications and aortic Atherosclerosis (ICD10-I70.0). Electronically Signed   By: Allegra Lai M.D.   On: 06/25/2023 20:58     Assessment/Plan Metastatic cancer to brain [facility])  [name] presents today with cluster of focal seizures, characterized by left side posturing, left gaze deviation, and impaired awareness.  4 events were witnessed in the clinic today, 2mg  IM ativan was administered.  Patient was transported to the ED given encephalopathy and concern for status epilepticus.    Radiation treatments will be deferred today.    Fortunately, brain MRI demonstrated stable response to WBRT (thus far) and does not need to be repeated at this time.  Recommend goals of care discussion, palliative care involvement during inpatient admission given poor prognosis and quality of life concerns.    We appreciate the opportunity to participate in the care of Tabitha Ramirez.  We will follow along and continue to address these neurologic concerns one medically stabilized.   All questions were answered. The patient knows to call the clinic with any problems, questions or concerns. No barriers to learning were detected.  The total time spent in the encounter was 40 minutes and more than 50% was on counseling and review of test results   Henreitta Leber, MD Medical Director of Neuro-Oncology [facility] 07/24/23 12:24 PM

## 2023-07-24 NOTE — Consult Note (Signed)
NEUROLOGY CONSULT NOTE   Date of service: July 24, 2023 Patient Name: Tabitha Ramirez MRN:  [id number] DOB:  1946-05-31 Chief Complaint: "Seizures in doctors office" Requesting Provider: Melene Plan, DO  History of Present Illness  Tabitha Ramirez is a 77 y.o. 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 Liana Gerold office for evaluation of four witnessed complex partial seizures that were clustered possibly provoked by dehydration following whole brain radiation.  Patient had two other reported seizures while in the ED with twitching and impaired awareness lasting approximately 2 minutes each witnessed by patient's daughter at bedside.   Seizure semiology: left side posturing, left gaze, impaired awareness.  4 witnessed events in office today s/p 2 mg of IM ativan.     ROS  Comprehensive ROS performed and pertinent positives documented in HPI  Past History   Past Medical History:  Diagnosis Date   Allergy    Anemia    Arthritis    "joints" (09/29/2013)   Breast cancer (HCC)    GERD (gastroesophageal reflux disease)    Heart murmur    History of blood transfusion    "related to chemo/breast cancer" (09/29/2013)   Hypertension    Migraines    Personal history of chemotherapy    Personal history of radiation therapy    Radiation 11/05/13-12/24/13   Right breast    TMJ (temporomandibular joint syndrome)    "left; just dx'd" (09/29/2013   Past Surgical History:  Procedure Laterality Date   AXILLARY LYMPH NODE BIOPSY Right 03/10/2013    Procedure: AXILLARY LYMPH NODE BIOPSY;  Surgeon: Ernestene Mention, MD;  Location: [facility] OR;  Service: General;  Laterality: Right;  sentinel node with blue dye   BREAST BIOPSY     BREAST LUMPECTOMY Right 09/29/2013   needle localization w/axillary LND/notes 09/29/2013   BREAST LUMPECTOMY WITH NEEDLE LOCALIZATION AND AXILLARY LYMPH NODE DISSECTION Right 09/29/2013   Procedure: RIGHT BREAST NEEDLE LOCALIZED LUMPECTOMY AND AXILLARY LYMPH NODE DISSECTION;  Surgeon: Ernestene Mention, MD;  Location: [facility] OR;  Service: General;  Laterality: Right;   COLONOSCOPY N/A 04/30/2013   Procedure: COLONOSCOPY;  Surgeon: Graylin Shiver, MD;  Location: [facility];  Service: Endoscopy;  Laterality: N/A;   CRANIOTOMY Right 03/11/2022   Procedure: CRANIOTOMY TUMOR EXCISION;  Surgeon: Venetia Night, MD;  Location: [facility] ORS;  Service: Neurosurgery;  Laterality: Right;   ESOPHAGOGASTRODUODENOSCOPY N/A 04/28/2013   Procedure: ESOPHAGOGASTRODUODENOSCOPY (EGD);  Surgeon: Graylin Shiver, MD;  Location: [facility];  Service: Endoscopy;  Laterality: N/A;   MASTECTOMY Right 09/29/2013   "partial"   PORT-A-CATH REMOVAL  09/29/2013   PORT-A-CATH REMOVAL Left 09/29/2013   Procedure: REMOVAL PORT-A-CATH;  Surgeon: Ernestene Mention, MD;  Location: [facility] OR;  Service: General;  Laterality: Left;   PORTACATH PLACEMENT N/A 03/10/2013   Procedure: INSERTION PORT-A-CATH WITH FLUOROSCOPY AND ULTRASOUND;  Surgeon: Ernestene Mention, MD;  Location: [facility];  Service: General;  Laterality: N/A;   SURAL NERVE BX Right 05/13/2014   Procedure: SURAL NERVE BIOPSY;  Surgeon: Hewitt Shorts, MD;  Location: [facility];  Service:  Neurosurgery;  Laterality: Right;  Right sural nerve biopsy   TONSILLECTOMY     TOTAL ABDOMINAL HYSTERECTOMY     With bilateral salpingo-oophorectomy   Family History: Family History  Problem Relation Age of Onset   Cancer Mother 72       Unknown type of cancer   Congestive Heart Failure Mother    Breast cancer Mother     Colon cancer Brother    Diabetes Brother 12   Heart disease Brother        AMI 05/22/2012   Congestive Heart Failure Brother    Cancer Maternal Grandmother 76       Unknown type of cancer   Breast cancer Maternal Grandmother    Social History  reports that she has quit smoking. Her smoking use included cigarettes. She has never used smokeless tobacco. She reports current alcohol use. She reports that she does not use drugs.  Allergies  Allergen Reactions   Penicillins Rash    Face swelling   Nsaids Nausea And Vomiting and Other (See Comments)    Pt states stomach ulcers, vomiting, and bleeding.   Medications  No current facility-administered medications for this encounter.  Current Outpatient Medications:    acetaminophen (TYLENOL) 325 MG tablet, Take 1-2 tablets (325-650 mg total) by mouth every 6 (six) hours as needed for mild pain. (Patient taking differently: Take 650 mg by mouth every 6 (six) hours as needed for mild pain (pain score 1-3) or moderate pain (pain score 4-6).), Disp: , Rfl:    anastrozole (ARIMIDEX) 1 MG tablet, TAKE 1 TABLET(1 MG) BY MOUTH DAILY (Patient taking differently: Take 1 mg by mouth daily.), Disp: 90 tablet, Rfl: 3   Cholecalciferol (VITAMIN D3) 10 MCG (400 UNIT) CHEW, Chew 2,000 Units by mouth daily., Disp: , Rfl:    dexamethasone (DECADRON) 4 MG tablet, Take 1 tablet (4 mg total) by mouth daily. Follow up with Dr. Barbaraann Cao to get refills., Disp: , Rfl:    feeding supplement (ENSURE ENLIVE / ENSURE PLUS) LIQD, Take 237 mLs by mouth 2 (two) times daily between meals., Disp: , Rfl:    ferrous sulfate 325 (65 FE) MG tablet, Take 325 mg by mouth daily., Disp: , Rfl:    lacosamide 100 MG TABS, Take 1 tablet (100 mg total) by mouth 2 (two) times daily., Disp: 60 tablet, Rfl: 0   levETIRAcetam (KEPPRA) 1000 MG tablet, Take 2 tablets (2,000 mg total) by mouth 2 (two) times daily., Disp: , Rfl:    levothyroxine (SYNTHROID) 100 MCG tablet, Take 100 mcg by mouth daily  before breakfast., Disp: , Rfl:    LORazepam (ATIVAN) 0.5 MG tablet, Take 1 tablet (0.5 mg total) by mouth daily as needed for seizure., Disp: 20 tablet, Rfl: 0   ondansetron (ZOFRAN-ODT) 4 MG disintegrating tablet, Take 4 mg by mouth every 6 (six) hours as needed., Disp: , Rfl:    oxyCODONE-acetaminophen (PERCOCET/ROXICET) 5-325 MG tablet, Take 1 tablet by mouth 4 (four) times daily as needed., Disp: , Rfl:    potassium chloride (KLOR-CON) 10 MEQ tablet, Take 10 mEq by mouth daily., Disp: , Rfl:    rivaroxaban (XARELTO) 10 MG TABS tablet, Take 1 tablet (10 mg total) by mouth daily., Disp: , Rfl:    rosuvastatin (CRESTOR) 10 MG tablet, Take 1 tablet (10 mg total) by mouth daily. (Patient taking differently: Take 10 mg by mouth every evening.), Disp: 30 tablet, Rfl: 0   triamterene-hydrochlorothiazide (MAXZIDE-25) 37.5-25 MG tablet, Take 1 tablet by mouth daily.,  Disp: , Rfl:    vitamin B-12 (CYANOCOBALAMIN) 500 MCG tablet, Take 500 mcg by mouth daily., Disp: , Rfl:   Vitals   Vitals:   07/24/23 1341 07/24/23 1530 07/24/23 1620  BP: 112/71 117/70 117/70  Pulse: 67 69 69  Resp: 20 (!) 24 (!) 24  Temp: 97.8 F (36.6 C)  97.8 F (36.6 C)  TempSrc: Oral    SpO2: 99% 100% 99%  Weight: 54 kg    Height: 5\' 3"  (1.6 m)      Body mass index is 21.08 kg/m.  Physical Exam   Constitutional: Thin appearing elderly African-American female laying in ER stretcher, in no acute distress.  EEG monitoring being placed.  She complains of some drowsiness following her seizure episodes.  Psych: Affect appropriate to situation. She is cooperative throughout examination.  Eyes: No scleral injection.  HENT: No OP obstruction.  Head: Normocephalic.  Cardiovascular: Normal rate and regular rhythm on telemetry. Respiratory: Effort normal, non-labored breathing on room air GI: Soft.  No distension. There is no tenderness.  Skin: WDI.   Neurologic Examination   Mental Status: Patient is awake though  drowsy.  She is oriented to self, place, and situation.  She is able to effectively state the year but incorrectly states that she is 77 years old. Speech is hypophonic, limited speech output.  She occasionally speaks in full sentences and a hardly audible tone. No evidence of aphasia or dysarthria on exam.  She follows simple commands without difficulty.  There is psychomotor slowing noted during assessment.  Cranial Nerves: II: Visual Fields are full. PERRL 3 mm, brisk III,IV, VI: EOMI without gaze preference or nystagmus V: Facial sensation is intact and symmetric to light touch VII: Facial movement is symmetric resting and with movement VIII: Hearing is intact to voice X: Palate elevates symmetrically, hypophonic speech, at times hardly audible XI: Shoulder shrug is symmetric. XII: Tongue protrudes midline  Motor: Bulk is decreased.  Generalized weakness throughout with 4/5 strength, no asymmetry noted.  Sensory: Intact and symmetric to light touch in the arms and the legs bilaterally. Cerebellar: No ataxia with FNF  On later attending examination patient is drowsy having been awoken from sleeping state.  She cannot correctly name the month for her age.  She has trouble correctly reporting number of fingers although she orients to stimuli in all visual fields.  She is unable to provide any significant details of history  Labs   CBC:  Recent Labs  Lab 07/24/23 1540  WBC 9.0  NEUTROABS 6.5  HGB 11.0*  HCT 33.4*  MCV 102.1*  PLT 297   Basic Metabolic Panel:  Lab Results  Component Value Date   NA 141 07/24/2023   K 3.4 (L) 07/24/2023   CO2 27 07/24/2023   GLUCOSE 103 (H) 07/24/2023   BUN 27 (H) 07/24/2023   CREATININE 1.12 (H) 07/24/2023   CALCIUM 9.5 07/24/2023   GFRNONAA 51 (L) 07/24/2023   GFRAA 85 (L) 05/12/2014   Lipid Panel:  Lab Results  Component Value Date   LDLCALC 87 11/29/2011   HgbA1c:  Lab Results  Component Value Date   HGBA1C 7.1 (H)  04/27/2013   Urine Drug Screen: No results found for: "LABOPIA", "COCAINSCRNUR", "LABBENZ", "AMPHETMU", "THCU", "LABBARB"  Alcohol Level     Component Value Date/Time   ETH <10 02/10/2023 1940   INR  Lab Results  Component Value Date   INR 1.2 02/10/2023   APTT  Lab Results  Component Value Date  APTT 25 02/10/2023   AED levels:  Lab Results  Component Value Date   LEVETIRACETA 85.4 (H) 07/17/2023   CT Head without contrast(Personally reviewed): Acute intracranial process.  No acute hemorrhage or infarct.  Redemonstrated right parietal resection cavity and extra-axial metastatic disease along the right parietal right cerebral convexity, which is not as well-seen on CT as it is on MRI.  An MRI with and without contrast is recommended if future evaluation is indicated at this time.  MRI Brain 07/17/23 (Personally reviewed): Unchanged dural based enhancement of the right parietal resection cavity, measuring up to 18 mm in thickness.  Unchanged nodular enhancement extending anteriorly from the inferior aspect of the right parietal resection cavity.  No new sites of intracranial metastasis.  Unchanged extent of surrounding T2 hyperintensity in the subcortical white matter, likely a mix of vasogenic edema and radiation gliosis.  rEEG ordered, pending   Impression   Tabitha Ramirez is a 77 y.o. female with history significant for right breast invasive ductal carcinoma s/p lumpectomy, chemotherapy, radiation therapy admitted brain metastasis s/p right parietal mass resection in 2003 undergoing whole brain radiation therapy with subsequent seizures on Keppra and Vimpat additional new long identified on CT chest imaging in September 2024 presenting to the ED from Dr. Liana Gerold office following 4 witnessed, clustered, complex partial breakthrough seizures despite reported medication compliance at home.  She was given 2 mg of IM Ativan and transferred to the ED for further evaluation/concern for  status epilepticus where patient's daughter reported 2 more seizure events lasting approximately 2 minutes each.   Given that complex partial seizures can be difficult to treat in addition to patient with poor functional status at baseline, prognosis remains poor and goals of care discussions would likely be beneficial.   Recommendations  - No need to repeat MRI brain imaging with recent stable MRI on 07/17/23, in discussion with Dr. Barbaraann Cao - Overnight EEG monitoring - Keppra level and vimpat level to confirm adherence / absorption  - Continue Keppra at 2,000 mg BID  - Increase Vimpat to 150 mg BID - Continue Dexamethasone 4 mg daily - Seizure precautions - 2 mg IV Ativan STAT for seizure activity lasting > 5 minutes and notify neurology - Will need further goals of care discussions moving forward. Recommend palliative care consultation to assist with goals of care discussion moving forward.  ______________________________________________________________________  Dema Severin, AGACNP-BC [facility] Pager: [phone]  Attending Neurologist's note:  I personally saw this patient, gathering history, performing a neurologic examination, reviewing relevant labs, personally reviewing relevant imaging including head CT, and formulated the assessment and plan, adding the note above for completeness and clarity to accurately reflect my thoughts  Brooke Dare MD-PhD [facility] [phone] Available 7 AM to 7 PM, outside these hours please contact Neurologist on call listed on AMION

## 2023-07-25 ENCOUNTER — Ambulatory Visit: Payer: Medicare PPO

## 2023-07-25 ENCOUNTER — Other Ambulatory Visit (HOSPITAL_COMMUNITY): Payer: Self-pay

## 2023-07-25 DIAGNOSIS — E785 Hyperlipidemia, unspecified: Secondary | ICD-10-CM | POA: Diagnosis present

## 2023-07-25 DIAGNOSIS — Z923 Personal history of irradiation: Secondary | ICD-10-CM | POA: Diagnosis not present

## 2023-07-25 DIAGNOSIS — Z9221 Personal history of antineoplastic chemotherapy: Secondary | ICD-10-CM | POA: Diagnosis not present

## 2023-07-25 DIAGNOSIS — Y92230 Patient room in hospital as the place of occurrence of the external cause: Secondary | ICD-10-CM | POA: Diagnosis not present

## 2023-07-25 DIAGNOSIS — W06XXXA Fall from bed, initial encounter: Secondary | ICD-10-CM | POA: Diagnosis not present

## 2023-07-25 DIAGNOSIS — Z515 Encounter for palliative care: Secondary | ICD-10-CM | POA: Diagnosis not present

## 2023-07-25 DIAGNOSIS — Z7901 Long term (current) use of anticoagulants: Secondary | ICD-10-CM | POA: Diagnosis not present

## 2023-07-25 DIAGNOSIS — Z79890 Hormone replacement therapy (postmenopausal): Secondary | ICD-10-CM | POA: Diagnosis not present

## 2023-07-25 DIAGNOSIS — C7931 Secondary malignant neoplasm of brain: Secondary | ICD-10-CM | POA: Diagnosis present

## 2023-07-25 DIAGNOSIS — Z833 Family history of diabetes mellitus: Secondary | ICD-10-CM | POA: Diagnosis not present

## 2023-07-25 DIAGNOSIS — Z87891 Personal history of nicotine dependence: Secondary | ICD-10-CM | POA: Diagnosis not present

## 2023-07-25 DIAGNOSIS — C50911 Malignant neoplasm of unspecified site of right female breast: Secondary | ICD-10-CM

## 2023-07-25 DIAGNOSIS — G40901 Epilepsy, unspecified, not intractable, with status epilepticus: Secondary | ICD-10-CM

## 2023-07-25 DIAGNOSIS — Z8249 Family history of ischemic heart disease and other diseases of the circulatory system: Secondary | ICD-10-CM | POA: Diagnosis not present

## 2023-07-25 DIAGNOSIS — Z853 Personal history of malignant neoplasm of breast: Secondary | ICD-10-CM | POA: Diagnosis not present

## 2023-07-25 DIAGNOSIS — G936 Cerebral edema: Secondary | ICD-10-CM | POA: Diagnosis present

## 2023-07-25 DIAGNOSIS — G40201 Localization-related (focal) (partial) symptomatic epilepsy and epileptic syndromes with complex partial seizures, not intractable, with status epilepticus: Secondary | ICD-10-CM | POA: Diagnosis present

## 2023-07-25 DIAGNOSIS — E876 Hypokalemia: Secondary | ICD-10-CM | POA: Diagnosis not present

## 2023-07-25 DIAGNOSIS — Z66 Do not resuscitate: Secondary | ICD-10-CM | POA: Diagnosis present

## 2023-07-25 DIAGNOSIS — S0083XA Contusion of other part of head, initial encounter: Secondary | ICD-10-CM | POA: Diagnosis not present

## 2023-07-25 DIAGNOSIS — I10 Essential (primary) hypertension: Secondary | ICD-10-CM | POA: Diagnosis present

## 2023-07-25 DIAGNOSIS — D509 Iron deficiency anemia, unspecified: Secondary | ICD-10-CM | POA: Diagnosis present

## 2023-07-25 DIAGNOSIS — D638 Anemia in other chronic diseases classified elsewhere: Secondary | ICD-10-CM | POA: Diagnosis present

## 2023-07-25 DIAGNOSIS — Z803 Family history of malignant neoplasm of breast: Secondary | ICD-10-CM | POA: Diagnosis not present

## 2023-07-25 DIAGNOSIS — G8929 Other chronic pain: Secondary | ICD-10-CM | POA: Diagnosis present

## 2023-07-25 DIAGNOSIS — R569 Unspecified convulsions: Secondary | ICD-10-CM | POA: Diagnosis not present

## 2023-07-25 DIAGNOSIS — Z880 Allergy status to penicillin: Secondary | ICD-10-CM | POA: Diagnosis not present

## 2023-07-25 DIAGNOSIS — E039 Hypothyroidism, unspecified: Secondary | ICD-10-CM | POA: Diagnosis present

## 2023-07-25 DIAGNOSIS — Z79899 Other long term (current) drug therapy: Secondary | ICD-10-CM | POA: Diagnosis not present

## 2023-07-25 LAB — CBC
HCT: 39.3 % (ref 36.0–46.0)
Hemoglobin: 12.6 g/dL (ref 12.0–15.0)
MCH: 32.1 pg (ref 26.0–34.0)
MCHC: 32.1 g/dL (ref 30.0–36.0)
MCV: 100.3 fL — ABNORMAL HIGH (ref 80.0–100.0)
Platelets: 248 10*3/uL (ref 150–400)
RBC: 3.92 MIL/uL (ref 3.87–5.11)
RDW: 14.6 % (ref 11.5–15.5)
WBC: 8.9 10*3/uL (ref 4.0–10.5)
nRBC: 0 % (ref 0.0–0.2)

## 2023-07-25 LAB — LEVETIRACETAM LEVEL: Levetiracetam Lvl: 120 ug/mL — ABNORMAL HIGH (ref 10.0–40.0)

## 2023-07-25 LAB — BASIC METABOLIC PANEL
Anion gap: 10 (ref 5–15)
BUN: 22 mg/dL (ref 8–23)
CO2: 23 mmol/L (ref 22–32)
Calcium: 9.2 mg/dL (ref 8.9–10.3)
Chloride: 105 mmol/L (ref 98–111)
Creatinine, Ser: 0.94 mg/dL (ref 0.44–1.00)
GFR, Estimated: >60 mL/min (ref >60)
Glucose, Bld: 67 mg/dL — ABNORMAL LOW (ref 70–99)
Potassium: 3.7 mmol/L (ref 3.5–5.1)
Sodium: 138 mmol/L (ref 135–145)

## 2023-07-25 MED ORDER — ANASTROZOLE 1 MG PO TABS
1.0000 mg | ORAL_TABLET | Freq: Every day | ORAL | Status: DC
  Administered 2023-07-25 – 2023-07-30 (×6): 1 mg via ORAL
  Filled 2023-07-25 (×6): qty 1

## 2023-07-25 MED ORDER — LORAZEPAM 2 MG/ML PO CONC: 2.0000 mg | ORAL | Status: DC | PRN

## 2023-07-25 MED ORDER — POTASSIUM CHLORIDE CRYS ER 10 MEQ PO TBCR
10.0000 meq | EXTENDED_RELEASE_TABLET | Freq: Every day | ORAL | Status: DC
Start: 2023-07-25 — End: 2023-07-30
  Administered 2023-07-25 – 2023-07-30 (×6): 10 meq via ORAL
  Filled 2023-07-25 (×6): qty 1

## 2023-07-25 MED ORDER — ONDANSETRON 4 MG PO TBDP: 4.0000 mg | ORAL_TABLET | Freq: Four times a day (QID) | ORAL | Status: DC | PRN

## 2023-07-25 MED ORDER — ORAL CARE MOUTH RINSE: 15.0000 mL | OROMUCOSAL | Status: DC | PRN

## 2023-07-25 NOTE — Procedures (Signed)
Patient Name: BONDA LUSBY  MRN: [id number]  Epilepsy Attending: [name]  Referring Physician/Provider: Gordy Councilman, MD   Duration: 07/24/2023 1903 to 07/25/2023 1903   Patient history: 77yo F presented following 4 witnessed, clustered, complex partial breakthrough seizures. EEG to evaluate for seizure   Level of alertness: Awake, asleep   AEDs during EEG study: LEV, LCM   Technical aspects: This EEG study was done with scalp electrodes positioned according to the 10-20 International system of electrode placement. Electrical activity was reviewed with band pass filter of 1-70Hz , sensitivity of 7 uV/mm, display speed of 92mm/sec with a 60Hz  notched filter applied as appropriate. EEG data were recorded continuously and digitally stored.  Video monitoring was available and reviewed as appropriate.   Description:  EEG showed continuous generalized 3 to 6 Hz theta-delta slowing. Additionally there was 12-14 Hz beta activity in right parietal region consistent with breach artifact. Sharp waves were noted in right parietal region, at times qasi- periodic at 1 Hz predominantly when awake.  Hyperventilation and photic stimulation were not performed.      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 Additionally there is cortical dysfunction in right parietal region consistent with underlying craniotomy. Lastly there is moderate diffuse encephalopathy. No seizures were seen throughout the recording.   Titus Drone [name]

## 2023-07-25 NOTE — Evaluation (Signed)
Clinical/Bedside Swallow Evaluation Patient Details  Name: Tabitha Ramirez MRN: [id number] Date of Birth: 1945/09/26  Today's Date: 07/25/2023 Time: SLP Start Time (ACUTE ONLY): 0900 SLP Stop Time (ACUTE ONLY): 0907 SLP Time Calculation (min) (ACUTE ONLY): 7 min  Past Medical History:  Past Medical History:  Diagnosis Date   Allergy    Anemia    Arthritis    "joints" (09/29/2013)   Breast cancer (HCC)    GERD (gastroesophageal reflux disease)    Heart murmur    History of blood transfusion    "related to chemo/breast cancer" (09/29/2013)   Hypertension    Migraines    Personal history of chemotherapy    Personal history of radiation therapy    Radiation 11/05/13-12/24/13   Right breast    TMJ (temporomandibular joint syndrome)    "left; just dx'd" (09/29/2013   Past Surgical History:  Past Surgical History:  Procedure Laterality Date   AXILLARY LYMPH NODE BIOPSY Right 03/10/2013   Procedure: AXILLARY LYMPH NODE BIOPSY;  Surgeon: Ernestene Mention, MD;  Location: [facility] OR;  Service: General;  Laterality: Right;  sentinel node with blue dye   BREAST BIOPSY     BREAST LUMPECTOMY Right 09/29/2013   needle localization w/axillary LND/notes 09/29/2013   BREAST LUMPECTOMY WITH NEEDLE LOCALIZATION AND AXILLARY LYMPH NODE DISSECTION Right 09/29/2013   Procedure: RIGHT BREAST NEEDLE LOCALIZED LUMPECTOMY AND AXILLARY LYMPH NODE DISSECTION;  Surgeon: Ernestene Mention, MD;  Location: [facility] OR;  Service: General;  Laterality: Right;   COLONOSCOPY N/A 04/30/2013   Procedure: COLONOSCOPY;  Surgeon: Graylin Shiver, MD;  Location: [facility];  Service: Endoscopy;  Laterality: N/A;   CRANIOTOMY Right 03/11/2022   Procedure: CRANIOTOMY TUMOR EXCISION;  Surgeon: Venetia Night, MD;  Location: [facility] ORS;  Service: Neurosurgery;  Laterality: Right;   ESOPHAGOGASTRODUODENOSCOPY N/A 04/28/2013   Procedure: ESOPHAGOGASTRODUODENOSCOPY (EGD);  Surgeon: Graylin Shiver, MD;  Location: [facility];  Service: Endoscopy;   Laterality: N/A;   MASTECTOMY Right 09/29/2013   "partial"   PORT-A-CATH REMOVAL  09/29/2013   PORT-A-CATH REMOVAL Left 09/29/2013   Procedure: REMOVAL PORT-A-CATH;  Surgeon: Ernestene Mention, MD;  Location[facility] OR;  Service: General;  Laterality: Left;   PORTACATH PLACEMENT N/A 03/10/2013   Procedure: INSERTION PORT-A-CATH WITH FLUOROSCOPY AND ULTRASOUND;  Surgeon: Ernestene Mention, MD;  Location: [facility] OR;  Service: General;  Laterality: N/A;   SURAL NERVE BX Right 05/13/2014   Procedure: SURAL NERVE BIOPSY;  Surgeon: Hewitt Shorts, MD;  Location: [facility];  Service: Neurosurgery;  Laterality: Right;  Right sural nerve biopsy   TONSILLECTOMY     TOTAL ABDOMINAL HYSTERECTOMY     With bilateral salpingo-oophorectomy   HPI:  Tabitha Ramirez is a 77 y.o. female who was admitted to [facility] following 4 seizure events at oncology clinic on 10/29.  CT 10/29 with no acute findings and redemonstration of post surgical changes and metastaic disease. 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    Assessment / Plan / Recommendation  Clinical Impression  Pt presents with mild risk of aspiration in setting of breakthrough seizures associated with metastases to brain.  She exhibited baseline coughing/throat clearing prior to administration of PO trials.  Vocal quality hoarse, breathy, and hypophonic.  Family at bedside states this is a change from baseline. Today pt tolerated sips of thin liquid  by spoon.  Pt took single straw sips.  She intermittently required a secondary swallow.  Pt tolerated puree with no clinical s/s of aspiration.  Pt exhibited difficuly biting through graham cracker, but was able to with encouragement.  She exhibited good oral clearance of both regular and soft solid textures.  There was delayed, very slight throat clear  following both of these textures, but pt denied feeling of anything going down the wrong way and has exhbited baseline cough/throat clear.  Discussed diet pt with preference for soft v regular solid texture diet.  She would prefer unrestricted diet choosing softer foods as needed.  SLP will follow for diet tolerance v need for instrumental assessment if throat clearing persists with PO intake.    Recommend regular texture diet with thin liquids.  SLP Visit Diagnosis: Dysphagia, unspecified (R13.10)    Aspiration Risk  Mild aspiration risk    Diet Recommendation Regular;Thin liquid    Liquid Administration via: Cup;Straw Medication Administration: Whole meds with liquid Supervision: Staff to assist with self feeding Compensations: Slow rate;Small sips/bites Postural Changes: Seated upright at 90 degrees    Other  Recommendations Oral Care Recommendations: Oral care BID    Recommendations for follow up therapy are one component of a multi-disciplinary discharge planning process, led by the attending physician.  Recommendations may be updated based on patient status, additional functional criteria and insurance authorization.  Follow up Recommendations No SLP follow up      Assistance Recommended at Discharge  N/A  Functional Status Assessment Patient has had a recent decline in their functional status and demonstrates the ability to make significant improvements in function in a reasonable and predictable amount of time.  Frequency and Duration min 2x/week  2 weeks       Prognosis Prognosis for improved oropharyngeal function:  (N/A)      Swallow Study   General Date of Onset: 07/24/23 HPI: Tabitha Ramirez is a 77 y.o. female who was admitted to [facility] following 4 seizure events at oncology clinic on 10/29.  CT 10/29 with no acute findings and redemonstration of post surgical changes and metastaic disease. Pt with a past medical history of  HTN, migraines, ductal carcinoma of right  breast s/p lumpectomy, brain metastasis s/p right parietal resection in 2023, currently undergoing whole brain radiation, with subsequent seizures. Type of Study: Bedside Swallow Evaluation Previous Swallow Assessment: BSE 05/22/23 with recs for reg/thin Temperature Spikes Noted: No Respiratory Status: Room air History of Recent Intubation: No Behavior/Cognition: Alert;Cooperative;Pleasant mood Oral Cavity Assessment: Within Functional Limits Oral Care Completed by SLP: No Oral Cavity - Dentition: Adequate natural dentition Self-Feeding Abilities: Needs assist Patient Positioning: Upright in bed Baseline Vocal Quality: Breathy;Low vocal intensity Volitional Cough: Weak Volitional Swallow: Able to elicit    Oral/Motor/Sensory Function Overall Oral Motor/Sensory Function: Mild impairment Facial ROM: Within Functional Limits Facial Symmetry: Within Functional Limits Lingual ROM: Within Functional Limits Lingual Symmetry: Within Functional Limits Lingual Strength: Reduced Velum: Within Functional Limits Mandible: Within Functional Limits   Ice Chips Ice chips: Not tested   Thin Liquid Thin Liquid: Impaired Presentation: Straw Pharyngeal  Phase Impairments: Multiple swallows    Nectar Thick Nectar Thick Liquid: Not tested   Honey Thick Honey Thick Liquid: Not tested   Puree Puree: Within functional limits Presentation: Spoon   Solid     Solid: Impaired Pharyngeal Phase Impairments: Throat Clearing - Delayed      Kerrie Pleasure, MA, CCC-SLP Acute Rehabilitation Services Office: [phone] 07/25/2023,9:38 AM

## 2023-07-25 NOTE — Progress Notes (Signed)
Report called to RN Zinata. Patient has continuous eeg going notified eeg to help transport patient.

## 2023-07-25 NOTE — Progress Notes (Addendum)
HD#0 Subjective:   Summary:  Tabitha Ramirez is a 77 y.o. 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  Overnight Events: Monitored on EEG. No seizure activity recorded, moderate to diffuse encephalopathy.   Patient feels "okay" this morning. Denies any further seizure activity overnight. Seizure occurred yesterday and reports that is the reason for this hospitalization. Denies any loss of consciousness, fevers, chills, shortness of breath or chest pain. Amenable with [place] discussing updates with daughter and planning for evaluation with palliative. Has palliative services established outpatient.    Objective:  Vital signs in last 24 hours: Vitals:   07/24/23 2037 07/24/23 2338 07/24/23 2359 07/25/23 0348  BP:  120/82 124/76 110/69  Pulse:  70 74 65  Resp:  16 16 17   Temp: 97.9 F (36.6 C) (!) 97.5 F (36.4 C) (!) 97.5 F (36.4 C) 97.6 F (36.4 C)  TempSrc: Oral Oral Oral Oral  SpO2:  97% 98% 98%  Weight:  54.8 kg    Height:  5\' 3"  (1.6 m)      Filed Weights   07/24/23 1341 07/24/23 2338  Weight: 54 kg 54.8 kg    Intake/Output Summary (Last 24 hours) at 07/25/2023 1124 Last data filed at 07/25/2023 0900 Gross per 24 hour  Intake 200 ml  Output --  Net 200 ml   Net IO Since Admission: 200 mL [07/25/23 1124]   Physical exam Physical Exam Constitutional:      General: She is not in acute distress.    Appearance: Normal appearance. She is not toxic-appearing.     Comments: EEG cap inplace  Cardiovascular:     Rate and Rhythm: Normal rate and regular rhythm.  Pulmonary:     Effort: Pulmonary effort is normal.     Breath sounds: Normal breath sounds.     Comments: Auscultated in anterior lung fields, due to patient positioning   Abdominal:     General: Bowel sounds are normal. There is no distension.     Palpations: Abdomen is soft.     Tenderness: There is no abdominal tenderness.  Neurological:     Mental Status: She is alert and oriented to person, place, and time.     Cranial Nerves: No cranial nerve deficit.  Psychiatric:        Mood and Affect: Mood normal.        Behavior: Behavior normal.      Pertinent Labs:     Latest Ref Rng & Units 07/25/2023    4:50 AM 07/24/2023    3:40 PM 07/17/2023    9:41 AM  BMP  Glucose 70 - 99 mg/dL 67  161  096   BUN 8 - 23 mg/dL 22  27  28    Creatinine 0.44 - 1.00 mg/dL 0.45  4.09  8.11   Sodium 135 - 145 mmol/L 138  141  143   Potassium 3.5 - 5.1 mmol/L 3.7  3.4  3.6   Chloride 98 - 111 mmol/L 105  103  104   CO2 22 - 32 mmol/L 23  27  27    Calcium 8.9 - 10.3 mg/dL 9.2  9.5  9.3       Latest Ref Rng & Units 07/25/2023    4:50 AM 07/24/2023    3:40 PM 07/17/2023  9:41 AM  CBC  WBC 4.0 - 10.5 K/uL 8.9  9.0  7.5   Hemoglobin 12.0 - 15.0 g/dL 43.3  29.5  18.8   Hematocrit 36.0 - 46.0 % 39.3  33.4  34.1   Platelets 150 - 400 K/uL 248  297  201      Imaging:  Overnight EEG with video [name], MD     07/25/2023 10:43 AM Patient Name: Tabitha Ramirez  MRN: [id number]  Epilepsy Attending: [name]  Referring Physician/Provider: Gordy Councilman, MD   Duration: 07/24/2023 1903 to 07/25/2023 1030   Patient history: 76yo F presented following 4 witnessed,  clustered, complex partial breakthrough seizures. EEG to evaluate  for seizure   Level of alertness: Awake, asleep   AEDs during EEG study: LEV, LCM   Technical aspects: This EEG study was done with scalp electrodes  positioned according to the 10-20 International system of  electrode placement. Electrical activity was reviewed with band  pass filter of 1-70Hz , sensitivity of 7 uV/mm, display speed of  96mm/sec with a 60Hz  notched filter applied as appropriate. EEG  data were  recorded continuously and digitally stored.  Video  monitoring was available and reviewed as appropriate.   Description:  EEG showed continuous generalized 3 to 6 Hz  theta-delta slowing. Additionally there was 12-14 Hz beta  activity in right parietal region consistent with breach  artifact. Sharp waves were noted in right parietal region, at  times qasi- periodic at 1 Hz predominantly when awake.   Hyperventilation and photic stimulation were not performed.      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 Additionally there is  cortical dysfunction in right parietal region consistent with  underlying craniotomy. Lastly there is moderate diffuse  encephalopathy. No seizures were seen throughout the recording.   [name]  EEG adult [name], MD     07/25/2023  6:11 AM Patient Name: Tabitha Ramirez  MRN: [id number]  Epilepsy Attending: [name]  Referring Physician/Provider: Melene Plan, DO  Date: 07/24/2023 Duration: 23.35 mins  Patient history: 77yo F presented following 4 witnessed,  clustered, complex partial breakthrough seizures. EEG to evaluate  for seizure  Level of alertness: Awake  AEDs during EEG study: LEV, LCM  Technical aspects: This EEG study was done with scalp electrodes  positioned according to the 10-20 International system of  electrode placement. Electrical activity was reviewed with band  pass filter of 1-70Hz , sensitivity of 7 uV/mm, display speed of  33mm/sec with a 60Hz  notched filter applied as appropriate. EEG  data were recorded continuously and digitally stored.  Video  monitoring was available and reviewed as appropriate.  Description:  EEG showed continuous generalized 3 to 6 Hz  theta-delta slowing. Additionally there was  12-14 Hz beta  activity in right parietal region consistent with breach  artifact.  Hyperventilation and photic stimulation were not  performed.     ABNORMALITY - Breach artifact, right parietal region -Continuous slow, generalized   IMPRESSION: This study is suggestive of cortical dysfunction arising from   right parietal region consistent with underlying craniotomy.  Additionally there is moderate diffuse encephalopathy. No  seizures or epileptiform discharges were seen throughout the  recording.  Priyanka [name]    Assessment/Plan:   Principal Problem:  Seizure (HCC) Active Problems:   Essential hypertension, benign   Anemia   HLD (hyperlipidemia)   Malignant neoplasm of female breast (HCC)   Cancer of right breast metastatic to brain [facility])   Hypothyroidism   Patient Summary: Tabitha Ramirez is a 77 y.o. 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   While at the oncologist office on 10/29, she had a cluster of 4 focal seizures characterized by left-sided posturing left gaze deviation and impaired awareness.  Multiple seizures were treated with 2 mg IM Ativan at the clinic. She was transferred to [facility] ED CT head w/o acute process, right parietal resection cavity and extra axial metastatic disease along the right parietal region consistent with MRI obtained on 07/17/23. Neurology consulted and increased home vimpat to 150 mg BID and continue home keppra at 2000 mg BID. Pt is poor, and  will be hard to control the seizures per neuro. EEG w/ moderate diffuse encephalopathy. ED  physician attempted goals of care discussion with daughter and amenable with palliative conversation today.  - Neurology following, appreciate recs  - Overnight EEG negative for seizure activity, continue w/ EEG for another 24 hours  - Keppra level and vimpat level pending - Continue Keppra at 2,000 mg BID  - Increased Vimpat to 150 mg BID, if any further seizures can increase to 200 mg BID  - Continue Dexamethasone 4 mg daily - Seizure precautions - 2 mg IV Ativan STAT for seizure activity lasting > 5 minutes and notify neurology   # Metastatic breast cancer.  Invasive ductal carcinoma grade 3 diagnosed in 01/2013.  Status post neoadjuvant chemotherapy, lumpectomy and radiation therapy in 2014-2015. On 06/23, patient had strokelike symptoms; left facial droop, left leg drift and decreased sensation apraxia.  MRI at that time showed irregular enhancing mass in the right parietal lobe.  She underwent craniotomy with tumor resection, with pathology showing metastatic carcinoma compatible with patient's known breast cancer.  She is currently getting whole brain radiation therapy.  Patient's oncologist, Dr. Barbaraann Cao also recommended Xarelto 10 mg for anticoagulation. Per patient's daughter, would like to be involved in palliative discussion via phone call. Patient has had palliative services established outpatient and they would be interested in skilled nursing facility placement at time of discharge if warranted.   - Palliative consult for GOC, formal note pending, appreciate recs  - Continue Anastrozole 1 mg. - Continue Xarelto 10 mg.   Anemia of chronic disease. Pt with history and admission 11.1 hgb. AM labs improving with 12.6 - AM CBC periodically    Chronic stable medical conditions   # Hypertension -BP 120-140s past 24 hours, mild hypertension controlled. Home medication is Triamterene-HCTZ 37.5-25 mg  - Continue home medication   # Hyperlipidemia Patient with past history, home  medication of Crestor 10 mg daily   - Continue home medication    # Hypothyroidism Patient with history and home medication of synthroid 100 mcg  - Continue home med    # Hypokalemia Patient 3.4 on admission. Takes potassium supplementation at home.  AM labs reassuring with 3.7.  - AM BMP periodically  -  Continue Kclor   # Nausea -Continue Zofran 4 mg prn    # Chronic pain Continue home Percocet 5-325 mg PRN     Diet: Regular Diet  VTE: Xeralto IVF: None  Code: DNR/DNI   Dispo: Admit patient to Observation with expected length of stay less than 2 midnights. PT/OT recs: pending  TOC recs: pending Family Update: Daughter, Vickie Epley [phone], updated with plan   Peterson Ao, MD PGY-1 Psych Resident Contact: secure message or page [phone] Please contact the on call pager after 5 pm and on weekends at [phone].

## 2023-07-25 NOTE — Progress Notes (Signed)
PT moved new room, Maint done, no skin breakdown, [employer] [facility]

## 2023-07-25 NOTE — Progress Notes (Signed)
Patient received from 5W, alert and oriented X3, vital signs stable, tele monitor connected, bed alarm on, call bell within reach, will continue to monitor

## 2023-07-25 NOTE — Consult Note (Signed)
Consultation Note Date: 07/25/2023   Patient Name: Tabitha Ramirez  DOB: [birth year]  MRN: [id number]  Age / Sex: 77 y.o., female  PCP: [facility] Referring Physician: Inez Catalina, MD  Reason for Consultation: Establishing goals of care  HPI/Patient Profile: 77 y.o. 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   Admitted after being seen by radiation oncology/Dr. Barbaraann Cao in clinic, witnessed seizure activity.  Presented to ED [facility] and transferred to [facility] to get EEG.  Patient is currently receiving oncologic care at [facility] and sees outpatient palliative Nurse Practitioner, Royal Hawthorn NP  Patient faces treatment option decisions,Advanced directive decisions and anticipatory care needs.   Clinical Assessment and Goals of Care:   This NP Lorinda Creed reviewed medical records, received report from team, assessed the patient and then meet at the patient's bedside and spoke by telephone with her daughter/H POA/ Tabitha Ramirez to discuss diagnosis, prognosis, GOC, EOL wishes disposition and options.  Patient is lethargic and unable to participate in detailed conversation today.   Concept of Palliative Care was introduced as specialized medical care for people and their families living with serious illness.  If focuses on providing relief from the symptoms and stress of a serious illness.  The goal is to improve quality of life for both the patient and the family.  Values and goals of care important to patient and family were attempted to be elicited.  As mentioned above patient is familiar with palliative services, she is seen in the outpatient  clinic at the cancer center.  Family  understand the seriousness of patient's current medical situation.  At this time hope is for continued treatment to prolong quality of life.  Decisions will be made depending on patient outcomes during this hospital stay.    A  discussion was had today regarding advanced directives.  Concepts specific to code status, artifical feeding and hydration, continued IV antibiotics and rehospitalization was had.    The difference between a aggressive medical intervention path  and a palliative comfort care path for this patient at this time was had.      Questions and concerns addressed.  Family encouraged to call with questions or concerns.     PMT will continue to support holistically.            HCPOA/ Daughter/ Tabitha Ramirez--see documents in Vynca    SUMMARY OF RECOMMENDATIONS    Code Status/Advance Care Planning: DNR   Palliative Prophylaxis:  Delirium Protocol and Frequent Pain Assessment  Additional Recommendations (Limitations, Scope, Preferences): Full Scope Treatment  Psycho-social/Spiritual:  Desire for further Chaplaincy support:no Additional Recommendations: Emotional support  Prognosis:  Unable to determine  Discharge Planning:   If patient is eligible on discharge, family is interested in ongoing rehabilitation at a skilled nursing facility.   To  Be Determined      Primary Diagnoses: Present on Admission:  Cancer of right breast metastatic to brain [facility])  Essential hypertension, benign  Anemia  HLD (hyperlipidemia)  Malignant neoplasm of female breast (HCC)  Hypothyroidism   I have reviewed the medical record, interviewed the patient and family, and examined the patient. The following aspects are pertinent.  Past Medical History:  Diagnosis Date   Allergy    Anemia    Arthritis    "joints" (09/29/2013)   Breast cancer (HCC)    GERD (gastroesophageal reflux disease)    Heart murmur    History  of blood transfusion    "related to chemo/breast cancer" (09/29/2013)   Hypertension    Migraines    Personal history of chemotherapy    Personal history of radiation therapy    Radiation 11/05/13-12/24/13   Right breast    TMJ (temporomandibular joint syndrome)    "left; just dx'd" (09/29/2013   Social History   Socioeconomic History   Marital status: Divorced    Spouse name: n/a   Number of children: 1   Years of education: 17   Highest education level: Not on file  Occupational History   Occupation: retired    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 Currently work as Lawyer   Right handed    Lives with daughter and son in law    Social Determinants of Health   Financial Resource Strain: Not on file  Food Insecurity: No Food Insecurity (07/24/2023)   Hunger Vital Sign    Worried About Running Out of Food in the Last Year: Never true    Ran Out of Food in the Last Year: Never true  Transportation Needs: No Transportation Needs (07/24/2023)   PRAPARE - Administrator, Civil Service (Medical): No    Lack of Transportation (Non-Medical): No  Physical Activity: Not on file  Stress: Not on file  Social Connections: Not on file   Family History  Problem Relation Age of Onset   Cancer Mother 71       Unknown type of cancer   Congestive Heart Failure Mother    Breast cancer Mother    Colon cancer Brother    Diabetes Brother 58   Heart disease  Brother        AMI 05/22/2012   Congestive Heart Failure Brother    Cancer Maternal Grandmother 61       Unknown type of cancer   Breast cancer Maternal Grandmother    Scheduled Meds:  anastrozole  1 mg Oral Daily   dexamethasone  4 mg Oral Daily   lacosamide  150 mg Oral BID   levETIRAcetam  2,000 mg Oral BID   levothyroxine  100 mcg Oral Q0600   potassium chloride  10 mEq Oral Daily   rivaroxaban  10 mg Oral Daily  rosuvastatin  10 mg Oral QPM   triamterene-hydrochlorothiazide  1 tablet Oral Daily   Continuous Infusions: PRN Meds:.acetaminophen **OR** acetaminophen, LORazepam, ondansetron, mouth rinse, oxyCODONE-acetaminophen Medications Prior to Admission:  Prior to Admission medications   Medication Sig Start Date End Date Taking? Authorizing Provider  acetaminophen (TYLENOL) 325 MG tablet Take 1-2 tablets (325-650 mg total) by mouth every 6 (six) hours as needed for mild pain. Patient taking differently: Take 650 mg by mouth every 6 (six) hours as needed for mild pain (pain score 1-3) or moderate pain (pain score 4-6). 02/13/23  Yes Elgergawy, Leana Roe, MD  anastrozole (ARIMIDEX) 1 MG tablet TAKE 1 TABLET(1 MG) BY MOUTH DAILY Patient taking differently: Take 1 mg by mouth daily. 04/26/23  Yes Serena Croissant, MD  Cholecalciferol (VITAMIN D3) 10 MCG (400 UNIT) CHEW Chew 2,000 Units by mouth daily.   Yes [provider]  dexamethasone (DECADRON) 4 MG tablet Take 1 tablet (4 mg total) by mouth daily. Follow up with Dr. Barbaraann Cao to get refills. 06/26/23  Yes Philomena Doheny, MD  ferrous sulfate 325 (65 FE) MG tablet Take 325 mg by mouth daily.   Yes [provider]  lacosamide 100 MG TABS Take 1 tablet (100 mg total) by mouth 2 (two) times daily. 06/26/23  Yes Philomena Doheny, MD  levETIRAcetam (KEPPRA) 1000 MG tablet Take 2 tablets (2,000 mg total) by mouth 2 (two) times daily. 07/17/23  Yes Lonell Grandchild, MD  levothyroxine (SYNTHROID) 100 MCG  tablet Take 100 mcg by mouth daily before breakfast.   Yes [provider]  LORazepam (ATIVAN) 0.5 MG tablet Take 1 tablet (0.5 mg total) by mouth daily as needed for seizure. 05/25/23  Yes Rocky Morel, DO  ondansetron (ZOFRAN-ODT) 4 MG disintegrating tablet Take 4 mg by mouth every 6 (six) hours as needed. 07/14/23  Yes [provider]  oxyCODONE-acetaminophen (PERCOCET/ROXICET) 5-325 MG tablet Take 1 tablet by mouth 4 (four) times daily as needed. 07/14/23  Yes [provider]  potassium chloride (KLOR-CON) 10 MEQ tablet Take 10 mEq by mouth daily. 03/10/22  Yes [provider]  rivaroxaban (XARELTO) 10 MG TABS tablet Take 1 tablet (10 mg total) by mouth daily. 06/26/23 07/26/23 Yes Gwenevere Abbot, MD  rosuvastatin (CRESTOR) 10 MG tablet Take 1 tablet (10 mg total) by mouth daily. Patient taking differently: Take 10 mg by mouth every evening. 04/26/22  Yes Jones Bales, NP  triamterene-hydrochlorothiazide (MAXZIDE-25) 37.5-25 MG tablet Take 1 tablet by mouth daily. 03/24/23  Yes [provider]  vitamin B-12 (CYANOCOBALAMIN) 500 MCG tablet Take 500 mcg by mouth daily.   Yes [provider]  feeding supplement (ENSURE ENLIVE / ENSURE PLUS) LIQD Take 237 mLs by mouth 2 (two) times daily between meals. Patient not taking: Reported on 07/24/2023 06/26/23   Philomena Doheny, MD   Allergies  Allergen Reactions   Penicillins Rash    Face swelling   Nsaids Nausea And Vomiting and Other (See Comments)    Pt states stomach ulcers, vomiting, and bleeding.   Review of Systems  Unable to perform ROS: Acuity of condition    Physical Exam Constitutional:      Appearance: She is underweight. She is ill-appearing.     Comments: Currently  Cardiovascular:     Rate and Rhythm: Normal rate.  Pulmonary:     Effort: Pulmonary effort is normal.  Musculoskeletal:     Comments: Generalize weakness   Skin:    General: Skin is warm  and dry.   Neurological:     Mental Status: She is lethargic.     Vital Signs: BP 110/69 (BP Location: Left Arm)   Pulse 65   Temp 97.6 F (36.4 C) (Oral)   Resp 17   Ht 5\' 3"  (1.6 m)   Wt 54.8 kg   SpO2 98%   BMI 21.40 kg/m  Pain Scale: 0-10   Pain Score: 0-No pain   SpO2: SpO2: 98 % O2 Device:SpO2: 98 % O2 Flow Rate: .   IO: Intake/output summary:  Intake/Output Summary (Last 24 hours) at 07/25/2023 1416 Last data filed at 07/25/2023 1200 Gross per 24 hour  Intake 320 ml  Output --  Net 320 ml    LBM:   Baseline Weight: Weight: 54 kg Most recent weight: Weight: 54.8 kg     Palliative Assessment/Data:      Time: 75 minutes  Signed by: Lorinda Creed, NP   Please contact Palliative Medicine Team phone at [phone] for questions and concerns.  For individual provider: See Loretha Stapler

## 2023-07-25 NOTE — Progress Notes (Signed)
Subjective: No acute events overnight.  No seizures overnight.  Patient denies any concerns.  ROS: negative except above  Examination  Vital signs in last 24 hours: Temp:  [97.5 F (36.4 C)-97.9 F (36.6 C)] 97.6 F (36.4 C) (10/30 0348) Pulse Rate:  [65-74] 65 (10/30 0348) Resp:  [16-24] 17 (10/30 0348) BP: (110-124)/(69-82) 110/69 (10/30 0348) SpO2:  [97 %-100 %] 98 % (10/30 0348) Weight:  [54.8 kg] 54.8 kg (10/29 2338)  General: lying in bed, NAD Neuro: MS: Alert, oriented, follows commands CN: pupils equal and reactive,  EOMI, face symmetric, tongue midline, normal sensation over face, Motor: 4/5 strength in all 4 extremities Coordination: normal Gait: not tested  Basic Metabolic Panel: Recent Labs  Lab 07/24/23 1540 07/25/23 0450  NA 141 138  K 3.4* 3.7  CL 103 105  CO2 27 23  GLUCOSE 103* 67*  BUN 27* 22  CREATININE 1.12* 0.94  CALCIUM 9.5 9.2  MG 2.1  --     CBC: Recent Labs  Lab 07/24/23 1540 07/25/23 0450  WBC 9.0 8.9  NEUTROABS 6.5  --   HGB 11.0* 12.6  HCT 33.4* 39.3  MCV 102.1* 100.3*  PLT 297 248     Coagulation Studies: No results for input(s): "LABPROT", "INR" in the last 72 hours.  Imaging personally reviewed  CT head without contrast 07/24/2023: No acute intracranial process.  No acute hemorrhage or infarct. Redemonstrated right parietal resection cavity and extra-axial metastatic disease along the right parietal right cerebral Convexity.  MRI brain with and without contrast 07/17/2023: Unchanged dural-based enhancement overlying the right parietal resection cavity, measuring up to 18 mm in thickness. Unchanged nodular enhancement extending anteriorly from the inferior aspect of the right parietal resection cavity. No new sites of intracranial metastasis.  Unchanged extent of surrounding T2 hyperintensity in the subcortical white matter, likely a mix of vasogenic edema and radiation gliosis.  ASSESSMENT AND PLAN: 77 year old female  with right breast invasive ductal carcinoma status postlumpectomy, chemoradiation, brain mets status post right parietal mass resection in 2003, whole brain radiation, seizures on Keppra and Vimpat who presented with increased frequency of breakthrough seizures  Epilepsy with breakthrough seizure Breast cancer with mets to brain -No further seizures overnight -Continue LTM EEG for another 24 hours.  If negative, will consider discontinuing -Continue Keppra 2000 mg twice daily and Vimpat 150 mg twice daily. -If any further seizures, can increase Vimpat to 200 mg twice daily -Reviewed Dr. Liana Gerold clinic note.  Recommend palliative care consult to assist with goals of care discussion -Continue seizure precautions -As needed IV Ativan for clinical seizures  I have spent a total of  36 minutes with the patient reviewing hospital notes,  test results, labs and examining the patient as well as establishing an assessment and plan that was discussed personally with the patient.  > 50% of time was spent in direct patient care.      Tabitha Ramirez Epilepsy [facility] For questions after 5pm please refer to AMION to reach the Neurologist on call

## 2023-07-25 NOTE — Progress Notes (Signed)
Electrodes held up well during transfer. No glue added.

## 2023-07-25 NOTE — Plan of Care (Signed)
  Problem: Education: Goal: Knowledge of General Education information will improve Description: Including pain rating scale, medication(s)/side effects and non-pharmacologic comfort measures Outcome: Progressing   Problem: Activity: Goal: Risk for activity intolerance will decrease Outcome: Progressing   Problem: Nutrition: Goal: Adequate nutrition will be maintained Outcome: Progressing   Problem: Coping: Goal: Level of anxiety will decrease Outcome: Progressing   

## 2023-07-25 NOTE — Plan of Care (Signed)
Pt has rested quietly throughout the night with no distress noted. Alert and oriented to person, place and situation. On room air. SR on the monitor. [place] to suction. Continuous EEG in place. No seizure activity noted.     Problem: Education: Goal: Knowledge of General Education information will improve Description: Including pain rating scale, medication(s)/side effects and non-pharmacologic comfort measures Outcome: Progressing   Problem: Health Behavior/Discharge Planning: Goal: Ability to manage health-related needs will improve Outcome: Progressing   Problem: Clinical Measurements: Goal: Ability to maintain clinical measurements within normal limits will improve Outcome: Progressing   Problem: Activity: Goal: Risk for activity intolerance will decrease Outcome: Progressing   Problem: Nutrition: Goal: Adequate nutrition will be maintained Outcome: Progressing   Problem: Coping: Goal: Level of anxiety will decrease Outcome: Progressing   Problem: Pain Management: Goal: General experience of comfort will improve Outcome: Progressing

## 2023-07-25 NOTE — Progress Notes (Signed)
vLTM maintenance All impedances below 10k. No skin breakdown noted at A1 A1  FP1  T3  P4

## 2023-07-25 NOTE — TOC Benefit Eligibility Note (Signed)
Patient Product/process development scientist completed.    The patient is insured through [employer]. Patient has Medicare and is not eligible for a copay card, but may be able to apply for patient assistance, if available.    Ran test claim for Nayzilam 5 mg/0.1 ml and the current 30 day co-pay is $64.00.  Ran test claim for Valtoco 10 mg Dose and the current 30 day co-pay is $100.00.  This test claim was processed through [facility]- copay amounts may vary at other pharmacies due to pharmacy/plan contracts, or as the patient moves through the different stages of their insurance plan.     Roland Earl, CPHT Pharmacy Technician III Certified Patient Advocate [facility] Patient Advocate Team Direct Number: [phone]  Fax: [phone]

## 2023-07-25 NOTE — Progress Notes (Signed)
Patient transported to 3W36. [facility] back monitoring.

## 2023-07-26 ENCOUNTER — Ambulatory Visit
Admission: RE | Admit: 2023-07-26 | Discharge: 2023-07-26 | Disposition: A | Discharge status: Home / Self Care | Payer: Medicare PPO | Source: Ambulatory Visit | Attending: Radiation Oncology | Admitting: Radiation Oncology

## 2023-07-26 ENCOUNTER — Other Ambulatory Visit (HOSPITAL_COMMUNITY): Payer: Self-pay

## 2023-07-26 ENCOUNTER — Ambulatory Visit: Payer: Medicare PPO

## 2023-07-26 ENCOUNTER — Other Ambulatory Visit: Payer: Self-pay

## 2023-07-26 DIAGNOSIS — C7931 Secondary malignant neoplasm of brain: Secondary | ICD-10-CM | POA: Diagnosis not present

## 2023-07-26 DIAGNOSIS — C50911 Malignant neoplasm of unspecified site of right female breast: Secondary | ICD-10-CM | POA: Diagnosis not present

## 2023-07-26 DIAGNOSIS — R569 Unspecified convulsions: Secondary | ICD-10-CM | POA: Diagnosis not present

## 2023-07-26 LAB — BASIC METABOLIC PANEL
Anion gap: 13 (ref 5–15)
BUN: 21 mg/dL (ref 8–23)
CO2: 20 mmol/L — ABNORMAL LOW (ref 22–32)
Calcium: 9.1 mg/dL (ref 8.9–10.3)
Chloride: 102 mmol/L (ref 98–111)
Creatinine, Ser: 0.94 mg/dL (ref 0.44–1.00)
GFR, Estimated: >60 mL/min (ref >60)
Glucose, Bld: 78 mg/dL (ref 70–99)
Potassium: 3.9 mmol/L (ref 3.5–5.1)
Sodium: 135 mmol/L (ref 135–145)

## 2023-07-26 LAB — CBC
HCT: 34.1 % — ABNORMAL LOW (ref 36.0–46.0)
Hemoglobin: 11.2 g/dL — ABNORMAL LOW (ref 12.0–15.0)
MCH: 32.1 pg (ref 26.0–34.0)
MCHC: 32.8 g/dL (ref 30.0–36.0)
MCV: 97.7 fL (ref 80.0–100.0)
Platelets: 294 10*3/uL (ref 150–400)
RBC: 3.49 MIL/uL — ABNORMAL LOW (ref 3.87–5.11)
RDW: 14.1 % (ref 11.5–15.5)
WBC: 6.3 10*3/uL (ref 4.0–10.5)
nRBC: 0 % (ref 0.0–0.2)

## 2023-07-26 LAB — RAD ONC ARIA SESSION SUMMARY
Course Elapsed Days: 22
Plan Fractions Treated to Date: 1
Plan Prescribed Dose Per Fraction: 3 Gy
Plan Total Fractions Prescribed: 5
Plan Total Prescribed Dose: 15 Gy
Reference Point Dosage Given to Date: 3 Gy
Reference Point Session Dosage Given: 3 Gy
Session Number: 11

## 2023-07-26 MED ORDER — NAYZILAM 5 MG/0.1ML NA SOLN
5.0000 mg | NASAL | 0 refills | Status: DC | PRN
  Filled 2023-07-26 – 2023-07-30 (×2): qty 10, 30d supply, fill #0

## 2023-07-26 MED ORDER — LORAZEPAM 2 MG/ML IJ SOLN: 2.0000 mg | INTRAMUSCULAR | Status: DC | PRN

## 2023-07-26 MED ORDER — FERROUS SULFATE 325 (65 FE) MG PO TABS
325.0000 mg | ORAL_TABLET | Freq: Every day | ORAL | Status: DC
  Administered 2023-07-26 – 2023-07-30 (×5): 325 mg via ORAL
  Filled 2023-07-26 (×5): qty 1

## 2023-07-26 MED ORDER — VALTOCO 5 MG DOSE 5 MG/0.1ML NA LIQD
15.0000 mg | NASAL | 0 refills | Status: DC | PRN
Start: 2023-07-26 — End: 2023-07-30
  Filled 2023-07-26: qty 10, 30d supply, fill #0

## 2023-07-26 NOTE — Progress Notes (Addendum)
HD#1 Subjective:   Summary:  Tabitha Ramirez is a 77 y.o. 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  Overnight Events: No acute events. Monitored on EEG. No seizure activity recorded, moderate to diffuse encephalopathy.   Patient feels "fine" this morning. Denies any loss of consciousness, fevers, chills, shortness of breath or chest pain. No seizures overnight. Daughter beside and updated on the plan of working with PT/OT. Has scheduled palliative radiation today and will be transported back to the floor afterwards. Daughter, would like placement at [facility]. Patient was at [facility] in October after prior hospitalization and now lives with them.    Objective:  Vital signs in last 24 hours: Vitals:   07/25/23 1550 07/25/23 2001 07/26/23 0010 07/26/23 0410  BP: 109/70 124/76 128/67   Pulse: 78     Resp: 16     Temp: 98 F (36.7 C) 98 F (36.7 C) 98 F (36.7 C) 98 F (36.7 C)  TempSrc: Oral Oral Axillary Axillary  SpO2: 99%  99%   Weight:      Height:        Filed Weights   07/24/23 1341 07/24/23 2338  Weight: 54 kg 54.8 kg    Intake/Output Summary (Last 24 hours) at 07/26/2023 4403 Last data filed at 07/25/2023 2107 Gross per 24 hour  Intake 740 ml  Output --  Net 740 ml   Net IO Since Admission: 740 mL [07/26/23 0616]   Physical exam Physical Exam Constitutional:      General: She is not in acute distress.    Appearance: Normal appearance. She is not toxic-appearing.     Comments: Awake, Alert, low tone of voice and says a few statements at at time, EEG cap inplace  Cardiovascular:     Rate and Rhythm: Normal rate and regular rhythm.  Pulmonary:     Effort: Pulmonary effort is normal.     Breath sounds: Normal  breath sounds.     Comments: Auscultated in anterior lung fields, due to patient positioning  Abdominal:     General: Bowel sounds are normal. There is no distension.     Palpations: Abdomen is soft.     Tenderness: There is no abdominal tenderness.  Neurological:     Mental Status: She is alert and oriented to person, place, and time.     Cranial Nerves: No cranial nerve deficit, dysarthria or facial asymmetry.     Motor: Weakness present.     Comments: 4 out of 5 upper extremity strength bilaterally   Psychiatric:        Mood and Affect: Mood normal.        Behavior: Behavior normal.      Pertinent Labs:     Latest Ref Rng & Units 07/25/2023    4:50 AM 07/24/2023    3:40 PM 07/17/2023    9:41 AM  BMP  Glucose 70 - 99 mg/dL 67  474  259   BUN 8 - 23 mg/dL 22  27  28    Creatinine 0.44 - 1.00 mg/dL 5.63  8.75  6.43   Sodium 135 - 145 mmol/L 138  141  143   Potassium 3.5 - 5.1 mmol/L 3.7  3.4  3.6   Chloride 98 - 111 mmol/L 105  103  104  CO2 22 - 32 mmol/L 23  27  27    Calcium 8.9 - 10.3 mg/dL 9.2  9.5  9.3       Latest Ref Rng & Units 07/25/2023    4:50 AM 07/24/2023    3:40 PM 07/17/2023    9:41 AM  CBC  WBC 4.0 - 10.5 K/uL 8.9  9.0  7.5   Hemoglobin 12.0 - 15.0 g/dL 60.4  54.0  98.1   Hematocrit 36.0 - 46.0 % 39.3  33.4  34.1   Platelets 150 - 400 K/uL 248  297  201      Imaging:  Overnight EEG with video [name], MD     07/25/2023 10:43 AM Patient Name: Tabitha Ramirez  MRN: [id number]  Epilepsy Attending: [name]  Referring Physician/Provider: Gordy Councilman, MD   Duration: 07/24/2023 1903 to 07/25/2023 1030   Patient history: 76yo F presented following 4 witnessed,  clustered, complex partial breakthrough seizures. EEG to evaluate  for seizure   Level of alertness: Awake, asleep   AEDs during EEG study: LEV, LCM   Technical aspects: This EEG study was done with scalp electrodes  positioned according to the 10-20 International  system of  electrode placement. Electrical activity was reviewed with band  pass filter of 1-70Hz , sensitivity of 7 uV/mm, display speed of  8mm/sec with a 60Hz  notched filter applied as appropriate. EEG  data were recorded continuously and digitally stored.  Video  monitoring was available and reviewed as appropriate.   Description:  EEG showed continuous generalized 3 to 6 Hz  theta-delta slowing. Additionally there was 12-14 Hz beta  activity in right parietal region consistent with breach  artifact. Sharp waves were noted in right parietal region, at  times qasi- periodic at 1 Hz predominantly when awake.   Hyperventilation and photic stimulation were not performed.      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 Additionally there is  cortical dysfunction in right parietal region consistent with  underlying craniotomy. Lastly there is moderate diffuse  encephalopathy. No seizures were seen throughout the recording.   [name]  EEG adult [name], MD     07/25/2023  6:11 AM Patient Name: Tabitha Ramirez  MRN: [id number]  Epilepsy Attending: [name]  Referring Physician/Provider: Melene Plan, DO  Date: 07/24/2023 Duration: 23.35 mins  Patient history: 77yo F presented following 4 witnessed,  clustered, complex partial breakthrough seizures. EEG to evaluate  for seizure  Level of alertness: Awake  AEDs during EEG study: LEV, LCM  Technical aspects: This EEG study was done with scalp electrodes  positioned according to the 10-20 International system of  electrode placement. Electrical activity was reviewed with band  pass filter of 1-70Hz , sensitivity of 7 uV/mm, display speed of  39mm/sec with a 60Hz  notched filter applied as appropriate. EEG  data  were recorded continuously and digitally stored.  Video  monitoring was available and reviewed as appropriate.  Description:  EEG showed continuous generalized 3 to 6 Hz  theta-delta slowing. Additionally there was 12-14 Hz beta  activity in right parietal region consistent with breach  artifact.  Hyperventilation and photic stimulation were not  performed.     ABNORMALITY - Breach artifact, right parietal region -Continuous slow, generalized   IMPRESSION: This study  is suggestive of cortical dysfunction arising from   right parietal region consistent with underlying craniotomy.  Additionally there is moderate diffuse encephalopathy. No  seizures or epileptiform discharges were seen throughout the  recording.  Priyanka [name]    Assessment/Plan:   Principal Problem:   Seizure (HCC) Active Problems:   Essential hypertension, benign   Anemia   HLD (hyperlipidemia)   Malignant neoplasm of female breast (HCC)   Cancer of right breast metastatic to brain [facility])   Hypothyroidism   Patient Summary: Tabitha Ramirez is a 77 y.o. 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   While at the oncologist office on 10/29, she had a cluster of 4 focal seizures characterized by left-sided posturing left gaze deviation and impaired awareness.  Multiple seizures were treated with 2 mg IM Ativan at the clinic. She was transferred to [facility] ED CT head w/o acute process, right parietal resection cavity and extra axial metastatic disease along the right parietal region consistent with MRI obtained on  07/17/23. Neurology consulted and increased home vimpat to 150 mg BID and continue home keppra at 2000 mg BID. Pt has poor prognosis and will be hard to control the seizures per neuro. EEG w/ moderate diffuse encephalopathy.  Patient orientated x4 this morning, still soft spoken, which is new per the daughter. Patient previously at [facility] rehab facility in October 2024 after recurrent seizures and transitioned to live with family. PT recommending acute inpatient rehab after evaluation today. CIR evaluation, unsure how much they can help given current prognosis. TOC consult for skilled nursing facility placement.   - Neurology following, appreciate recs, signed off   - Keppra level 120  - vimpat level pending - Continue Keppra at 2,000 mg BID  - Continue Increased Vimpat to 150 mg BID, can increase to 200 mg BID if seizures occur  - At d/c intranasal Nayzilam 5 mg or valtoco 15 mg for seizure lasting more than 2 minutes, daughter prefers Nayzilam  - Continue Dexamethasone 4 mg daily - Seizure precautions -- PT recs, acute inpatient rehab  -- CIR unsure of what they can offer from their perspective -- TOC consult placed for SNF  - 2 mg IV Ativan STAT for seizure activity lasting > 5 minutes and notify neurology   # Metastatic breast cancer.  Invasive ductal carcinoma grade 3 diagnosed in 01/2013.  Status post neoadjuvant chemotherapy, lumpectomy and radiation therapy in 2014-2015. On 06/23, patient had strokelike symptoms; left facial droop, left leg drift and decreased sensation apraxia.  MRI at that time showed irregular enhancing mass in the right parietal lobe.  She underwent craniotomy with tumor resection, with pathology showing metastatic carcinoma compatible with patient's known breast cancer.  She is currently getting whole brain radiation therapy.  Patient's oncologist, Dr. Barbaraann Cao also recommended Xarelto 10 mg for anticoagulation. Patient has had palliative services established  outpatient and they would be interested in skilled nursing facility placement at time of discharge if warranted. Patient has palliative radiation appointment scheduled at 1230, will receive treatment and return to the floor afterwards.  - Radiation treatment today  - Palliative following,  appreciate recs - Continue Anastrozole 1 mg. - Continue Xarelto 10 mg.   Anemia of chronic disease. Pt with history and admission 11.1 hgb. AM labs improving with 11.6. Stable, no overt signs of bleeding  - Continue Home ferrous sulfate daily    Chronic stable medical conditions   # Hypertension -BP 120-150s past 24 hours, mild hypertension controlled. Home medication is Triamterene-HCTZ 37.5-25 mg  - Continue home medication   # Hyperlipidemia Patient with past history, home medication of Crestor 10 mg daily   - Continue home medication    # Hypothyroidism Patient with history and home medication of synthroid 100 mcg  - Continue home med    # Hypokalemia Patient 3.4 on admission. Takes potassium supplementation at home.  AM labs reassuring with 3.7.  - Continue Kclor   # Nausea -Continue Zofran 4 mg prn    # Chronic pain Continue home Percocet 5-325 mg PRN     Diet: Regular Diet  VTE: Xeralto IVF: None  Code: DNR/DNI   Dispo: Medically stable, awaiting skilled nursing facility vs acute inpatient rehab placement  PT/OT recs: Acute Inpatient Rehab  [facility] recs: pending Family Update: Daughter, Vickie Epley [phone], updated with plan   Peterson Ao, MD PGY-1 Psych Resident Contact: secure message or page [phone] Please contact the on call pager after 5 pm and on weekends at [phone].

## 2023-07-26 NOTE — Plan of Care (Signed)
  Problem: Education: Goal: Knowledge of General Education information will improve Description: Including pain rating scale, medication(s)/side effects and non-pharmacologic comfort measures Outcome: Progressing   Problem: Clinical Measurements: Goal: Ability to maintain clinical measurements within normal limits will improve Outcome: Progressing   Problem: Pain Management: Goal: General experience of comfort will improve Outcome: Progressing

## 2023-07-26 NOTE — Progress Notes (Signed)
Inpatient Rehab Admissions Coordinator:   Per therapy recommendations pt was screened for CIR by Estill Dooms, PT, DPT.  Chart reviewed and note pt with multiple admissions for seizures with post-ictal decline in function.  Neurology feels seizures will be difficult to control.  Palliative discussions ongoing.  I'm not sure what we can offer from an AIR perspective if she is continuing to have these seizures.    Estill Dooms, PT, DPT Admissions Coordinator [phone] 07/26/23  11:32 AM

## 2023-07-26 NOTE — Progress Notes (Signed)
LTM EEG discontinued - no skin breakdown at unhook.   

## 2023-07-26 NOTE — Procedures (Addendum)
Patient Name: Tabitha Ramirez  MRN: [id number]  Epilepsy Attending: [name]  Referring Physician/Provider: Gordy Councilman, MD   Duration: 07/25/2023 1903 to 07/26/2023 1043   Patient history: 76yo F presented following 4 witnessed, clustered, complex partial breakthrough seizures. EEG to evaluate for seizure   Level of alertness: Awake, asleep   AEDs during EEG study: LEV, LCM   Technical aspects: This EEG study was done with scalp electrodes positioned according to the 10-20 International system of electrode placement. Electrical activity was reviewed with band pass filter of 1-70Hz , sensitivity of 7 uV/mm, display speed of 52mm/sec with a 60Hz  notched filter applied as appropriate. EEG data were recorded continuously and digitally stored.  Video monitoring was available and reviewed as appropriate.   Description:  EEG showed continuous generalized 3 to 6 Hz theta-delta slowing. Additionally there was 12-14 Hz beta activity in right parietal region consistent with breach artifact. Sharp waves were noted in right parietal region, at times qasi- periodic at 1 Hz predominantly when awake.  Hyperventilation and photic stimulation were not performed.      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 Additionally there is cortical dysfunction in right parietal region consistent with underlying craniotomy. Lastly there is moderate diffuse encephalopathy. No seizures were seen throughout the recording.   Mikail Goostree [name]

## 2023-07-26 NOTE — Discharge Instructions (Addendum)
You were hospitalized for recurrent seizures. Thank you for allowing [place] to be part of your care.   Please note these changes made to your medications:   *Please START taking:  - Continue increased Vimpat 150 mg twice daily for seizures control  - Continue Keppra 2000 mg twice daily for seizure control   - For rescue seizures : A prescription was written for the following medications: You only need to get ONE FILLED  - Nayzilam nasal spray ($64), this was your preferred option discussed with the pharmacy team      Please make sure to continue to follow-up with your oncology team and palliative care provider.

## 2023-07-26 NOTE — Progress Notes (Signed)
vLTM maintenance  All impedances below 10kohm.   No skin breakdown noted at   F3  FZ  CZ  A2

## 2023-07-26 NOTE — Plan of Care (Signed)
Pt alert and oriented x 4. IV is patent and saline locked. Denies any pain or discomfort. Continent of bowel and bladder. Pt transferred to Cancer center for radiation this shift. Daughter to bedside this shift. Will continue with current plan of care.  Problem: Education: Goal: Knowledge of General Education information will improve Description: Including pain rating scale, medication(s)/side effects and non-pharmacologic comfort measures Outcome: Progressing   Problem: Health Behavior/Discharge Planning: Goal: Ability to manage health-related needs will improve Outcome: Progressing   Problem: Clinical Measurements: Goal: Ability to maintain clinical measurements within normal limits will improve Outcome: Progressing   Problem: Activity: Goal: Risk for activity intolerance will decrease Outcome: Progressing   Problem: Nutrition: Goal: Adequate nutrition will be maintained Outcome: Progressing   Problem: Coping: Goal: Level of anxiety will decrease Outcome: Progressing   Problem: Pain Management: Goal: General experience of comfort will improve Outcome: Progressing   Problem: Education: Goal: Expressions of having a comfortable level of knowledge regarding the disease process will increase Outcome: Progressing   Problem: Coping: Goal: Ability to adjust to condition or change in health will improve Outcome: Progressing Goal: Ability to identify appropriate support needs will improve Outcome: Progressing   Problem: Health Behavior/Discharge Planning: Goal: Compliance with prescribed medication regimen will improve Outcome: Progressing   Problem: Medication: Goal: Risk for medication side effects will decrease Outcome: Progressing   Problem: Clinical Measurements: Goal: Complications related to the disease process, condition or treatment will be avoided or minimized Outcome: Progressing Goal: Diagnostic test results will improve Outcome: Progressing   Problem:  Safety: Goal: Verbalization of understanding the information provided will improve Outcome: Progressing   Problem: Self-Concept: Goal: Level of anxiety will decrease Outcome: Progressing Goal: Ability to verbalize feelings about condition will improve Outcome: Progressing

## 2023-07-26 NOTE — Evaluation (Signed)
Physical Therapy Evaluation Patient Details Name: Tabitha Ramirez MRN: [id number] DOB: [birth year] Today's Date: 07/26/2023  History of Present Illness  Pt is a 77 y.o. female who presented 07/24/23 with breakthrough seizures. PMH: Metastatic breast cancer to the brain s/p right parietal craniotomy and tumor resection on 03/11/2022, seizures, HLD, HTN, GERD, hypothyroidism, heart murmur, anemia, currently undergoing whole brain radiation   Clinical Impression  Pt presents with condition above and deficits mentioned below, see PT Problem List. Pt resides with her daughter and her family in a 1-level house with a ramped entrance option. Pt had progressed to ambulating with a RW at a CGA level at [facility] before being discharged home on 07/14/23. However, each time she has a seizure she starts over having to learn to walk and perform ADLs. Recently, her family has had to provide assistance for stand pivot transfers to/from her w/c. Pt is unable to propel the w/c herself. Currently, pt is demonstrating deficits in cognition, balance, coordination, activity tolerance, power, and strength. She also is soft spoken this date, which the daughter reports is not normal for her. SLP aware. At this time, pt is requiring minA to transition supine to sit, modA to transition sit to supine, and modA to transfer to stand with HHA. Pt was cued to take steps but only minimally lifted her heels off the ground and was unable to advance her feet to ambulate this date. As pt has the potential to improve and has had a drastic functional decline, she could benefit from intensive inpatient rehab, > 3 hours/day. Will continue to follow acutely.      If plan is discharge home, recommend the following: Two people to help with walking and/or transfers;A lot of help with bathing/dressing/bathroom;Assistance with cooking/housework;Direct supervision/assist for medications management;Direct supervision/assist for financial  management;Assist for transportation;Help with stairs or ramp for entrance   Can travel by private vehicle   No    Equipment Recommendations None recommended by PT  Recommendations for Other Services  Rehab consult    Functional Status Assessment Patient has had a recent decline in their functional status and demonstrates the ability to make significant improvements in function in a reasonable and predictable amount of time.     Precautions / Restrictions Precautions Precautions: Fall Precaution Comments: EEG, seizures Restrictions Weight Bearing Restrictions: No      Mobility  Bed Mobility Overal bed mobility: Needs Assistance Bed Mobility: Supine to Sit, Sit to Supine     Supine to sit: Min assist, HOB elevated, Used rails Sit to supine: Mod assist, HOB elevated   General bed mobility comments: Pt initiated bringing legs towards R EOB and reaching for R bed rail but needed minA to ascend trunk and scoot hips to EOB. ModA to direct trunk and legs back to supine.    Transfers Overall transfer level: Needs assistance Equipment used: 1 person hand held assist Transfers: Sit to/from Stand Sit to Stand: Mod assist           General transfer comment: Pt cued to hold onto therapist anterior to her but pt had difficulty wrapping her L arm around therapist. ModA needed to extend hips and power up to stand.    Ambulation/Gait               General Gait Details: unable to take steps when attempting this date  Stairs            Wheelchair Mobility     Tilt Bed    Modified  Rankin (Stroke Patients Only)       Balance Overall balance assessment: Needs assistance Sitting-balance support: Single extremity supported, Bilateral upper extremity supported, Feet supported Sitting balance-Leahy Scale: Poor Sitting balance - Comments: Reliant on UE support and min-modA for static sitting balance   Standing balance support: Bilateral upper extremity  supported Standing balance-Leahy Scale: Poor Standing balance comment: Reliant on bil UE support and modA to extend trunk and hips, providing verbal and tactile cues                             Pertinent Vitals/Pain Pain Assessment Pain Assessment: No/denies pain    Home Living Family/patient expects to be discharged to:: Private residence Living Arrangements: Children (daughter and son-in-law, 71 yo grandkid) Available Help at Discharge: Family;Available 24 hours/day Type of Home: House Home Access: Stairs to enter;Ramped entrance Entrance Stairs-Rails: Can reach both Entrance Stairs-Number of Steps: 6   Home Layout: One level Home Equipment: Agricultural consultant (2 wheels);Cane - single point;BSC/3in1;Grab bars - tub/shower;Hand held shower head;Tub bench;Shower seat;Wheelchair - manual      Prior Function Prior Level of Function : Needs assist             Mobility Comments: Was ambulatory with RW and CGA before d/c from [facility] 07/14/23, each time she has a seizure she starts over having to learn to walk, otherwise she is dependent on family to assist with stand pivot transfers to/from the w/c ADLs Comments: Each time she has a seizure she becomes reliant on family to assist with all ADLs then she recovers and begins to bathe and dress self again     Extremity/Trunk Assessment   Upper Extremity Assessment Upper Extremity Assessment: Defer to OT evaluation    Lower Extremity Assessment Lower Extremity Assessment: Generalized weakness;RLE deficits/detail;LLE deficits/detail RLE Deficits / Details: reports tingling throughout entire leg; MMT scores of 4 to 4+ grossly but functionally weak LLE Deficits / Details: reports tingling in thigh and lower leg but not in foot; MMT scores of 4 to 4+ grossly but functionally weak    Cervical / Trunk Assessment Cervical / Trunk Assessment: Normal  Communication   Communication Communication: Other (comment) (soft spoken,  which is not her usual per daughter)  Cognition Arousal: Alert Behavior During Therapy: Flat affect Overall Cognitive Status: Impaired/Different from baseline Area of Impairment: Following commands, Awareness, Problem solving                       Following Commands: Follows one step commands consistently, Follows one step commands with increased time   Awareness: Emergent Problem Solving: Slow processing, Decreased initiation, Difficulty sequencing, Requires verbal cues General Comments: Pt slow to process cues and initiate movement. Unsure if pt heard therapist's cues at times        General Comments General comments (skin integrity, edema, etc.): daughter present and supportive throughout; SBP in 90s supine, increased to 100s in sitting    Exercises     Assessment/Plan    PT Assessment Patient needs continued PT services  PT Problem List Decreased strength;Decreased activity tolerance;Decreased balance;Decreased mobility;Decreased coordination;Decreased knowledge of use of DME;Decreased cognition       PT Treatment Interventions DME instruction;Gait training;Functional mobility training;Therapeutic activities;Therapeutic exercise;Balance training;Neuromuscular re-education;Cognitive remediation;Patient/family education;Wheelchair mobility training    PT Goals (Current goals can be found in the Care Plan section)  Acute Rehab PT Goals Patient Stated Goal: to improve PT Goal Formulation: With patient/family  Time For Goal Achievement: 08/09/23 Potential to Achieve Goals: Fair    Frequency Min 1X/week     Co-evaluation               AM-PAC PT "6 Clicks" Mobility  Outcome Measure Help needed turning from your back to your side while in a flat bed without using bedrails?: A Little Help needed moving from lying on your back to sitting on the side of a flat bed without using bedrails?: A Little Help needed moving to and from a bed to a chair (including a  wheelchair)?: A Lot Help needed standing up from a chair using your arms (e.g., wheelchair or bedside chair)?: A Lot Help needed to walk in hospital room?: Total Help needed climbing 3-5 steps with a railing? : Total 6 Click Score: 12    End of Session   Activity Tolerance: Patient tolerated treatment well Patient left: in bed;with call bell/phone within reach;with bed alarm set;with family/visitor present;Other (comment) (with SLP) Nurse Communication: Mobility status PT Visit Diagnosis: Unsteadiness on feet (R26.81);Muscle weakness (generalized) (M62.81);Difficulty in walking, not elsewhere classified (R26.2);Other symptoms and signs involving the nervous system (R29.898);Other abnormalities of gait and mobility (R26.89)    Time: 0936-1000 PT Time Calculation (min) (ACUTE ONLY): 24 min   Charges:   PT Evaluation $PT Eval Moderate Complexity: 1 Mod PT Treatments $Therapeutic Activity: 8-22 mins PT General Charges $$ ACUTE PT VISIT: 1 Visit         Virgil Benedict, PT, DPT Acute Rehabilitation Services  Office: [phone]   Bettina Gavia 07/26/2023, 10:39 AM

## 2023-07-26 NOTE — Progress Notes (Signed)
Speech Language Pathology Treatment: Dysphagia  Patient Details Name: Tabitha Ramirez MRN: [id number] DOB: 11-06-1945 Today's Date: 07/26/2023 Time: 0938-1829 SLP Time Calculation (min) (ACUTE ONLY): 25 min  Assessment / Plan / Recommendation Clinical Impression  Pt seen for ongoing dysphagia management.  Pt with good tolerance of POs overnight per pt/family report.  RN administering pills on SLP arrival.  She had difficulty transiting multiple pills orally, but no clincal s/s of aspiration.  Provided puree to help create cohesive bolus.  Pt then took an additional 4-6 medications with puree without clincial s/s of aspiration and improve efficiency of oral phase.  Pt tolerated puree, thin liquid, and regular solids with no overt s/s of aspiration.  The throat clearing observed yesterday was not present.  SLP will sign off.   Recommend regular texture diet with thin liquid.   Pt's vocal quality remains dysphonic.  It is unclear this cause of this as it was sudden onset following first seizures. There has been no physical trauma to larynx or vocal folds.  Pt with lung mets which could be impacting respiratory drive, but this is unlikely to have caused a sudden change.  Neurology does not believe location of seizures is likely to be causing changes in vocal quality.  Whatever the etiology, pt may benefit from vocal assessment and intervention at next level of care.  Consider inpatient rehab if she is a candidate.     HPI HPI: Tabitha Ramirez is a 77 y.o. female who was admitted to [facility] following 4 seizure events at oncology clinic on 10/29.  CT 10/29 with no acute findings and redemonstration of post surgical changes and metastaic disease. 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      SLP Plan  All goals met;Discharge SLP  treatment due to (comment)      Recommendations for follow up therapy are one component of a multi-disciplinary discharge planning process, led by the attending physician.  Recommendations may be updated based on patient status, additional functional criteria and insurance authorization.    Recommendations  Medication Administration: Whole meds with puree Compensations: Slow rate;Small sips/bites Postural Changes and/or Swallow Maneuvers: Seated upright 90 degrees                  Oral care BID   None Dysphagia, unspecified (R13.10)     All goals met;Discharge SLP treatment due to (comment)     Kerrie Pleasure, MA, CCC-SLP Acute Rehabilitation Services Office: [phone] 07/26/2023, 10:26 AM

## 2023-07-26 NOTE — Progress Notes (Signed)
Subjective: No further seizures overnight.  Patient states she feels like a vibration in her chest but unable to elaborate further.  Did specifically deny any chest pain or shortness of breath.  Daughter at bedside.  ROS: negative except above  Examination  Vital signs in last 24 hours: Temp:  [97.6 F (36.4 C)-98 F (36.7 C)] 97.6 F (36.4 C) (10/31 1100) Pulse Rate:  [65-78] 65 (10/31 1100) Resp:  [16-19] 18 (10/31 1100) BP: (96-128)/(61-76) 114/67 (10/31 1100) SpO2:  [95 %-100 %] 100 % (10/31 1100)  General: lying in bed, NAD Neuro: MS: Alert, oriented to place and person but not to time, follows commands CN: pupils equal and reactive,  EOMI, face symmetric, tongue midline, normal sensation over face, Motor: 4/5 strength in all 4 extremities Coordination: normal Gait: not tested  Basic Metabolic Panel: Recent Labs  Lab 07/24/23 1540 07/25/23 0450 07/26/23 0716  NA 141 138 135  K 3.4* 3.7 3.9  CL 103 105 102  CO2 27 23 20*  GLUCOSE 103* 67* 78  BUN 27* 22 21  CREATININE 1.12* 0.94 0.94  CALCIUM 9.5 9.2 9.1  MG 2.1  --   --     CBC: Recent Labs  Lab 07/24/23 1540 07/25/23 0450 07/26/23 0716  WBC 9.0 8.9 6.3  NEUTROABS 6.5  --   --   HGB 11.0* 12.6 11.2*  HCT 33.4* 39.3 34.1*  MCV 102.1* 100.3* 97.7  PLT 297 248 294     Coagulation Studies: No results for input(s): "LABPROT", "INR" in the last 72 hours.  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 -Risk medication: Intranasal Valtoco 15  mg or Nayzilam 5mg  (as approved by insurance) for seizure lasting more than 2 minutes. -If any further seizures, can increase Vimpat to 200 mg twice daily -Daughter at bedside is aware of prognosis. -Continue seizure precautions -As needed IV Ativan for clinical seizures   I have spent a total of  36 minutes with the patient reviewing hospital notes,  test results, labs and examining the patient as well as establishing an assessment and plan that was discussed personally with the patient.  > 50% of time was spent in direct patient care.      Lindie Spruce Epilepsy [facility] For questions after 5pm please refer to AMION to reach the Neurologist on call

## 2023-07-27 ENCOUNTER — Ambulatory Visit
Admission: RE | Admit: 2023-07-27 | Discharge: 2023-07-27 | Disposition: A | Discharge status: Home / Self Care | Payer: Medicare PPO | Source: Ambulatory Visit | Attending: Radiation Oncology | Admitting: Radiation Oncology

## 2023-07-27 ENCOUNTER — Other Ambulatory Visit: Payer: Self-pay

## 2023-07-27 ENCOUNTER — Encounter (HOSPITAL_COMMUNITY): Payer: Self-pay | Admitting: Internal Medicine

## 2023-07-27 ENCOUNTER — Ambulatory Visit: Payer: Medicare PPO

## 2023-07-27 DIAGNOSIS — C7931 Secondary malignant neoplasm of brain: Secondary | ICD-10-CM | POA: Diagnosis not present

## 2023-07-27 DIAGNOSIS — Z510 Encounter for antineoplastic radiation therapy: Secondary | ICD-10-CM | POA: Insufficient documentation

## 2023-07-27 DIAGNOSIS — C50812 Malignant neoplasm of overlapping sites of left female breast: Secondary | ICD-10-CM | POA: Insufficient documentation

## 2023-07-27 DIAGNOSIS — R569 Unspecified convulsions: Secondary | ICD-10-CM | POA: Diagnosis not present

## 2023-07-27 DIAGNOSIS — C50911 Malignant neoplasm of unspecified site of right female breast: Secondary | ICD-10-CM | POA: Diagnosis not present

## 2023-07-27 LAB — RAD ONC ARIA SESSION SUMMARY
Course Elapsed Days: 23
Plan Fractions Treated to Date: 2
Plan Prescribed Dose Per Fraction: 3 Gy
Plan Total Fractions Prescribed: 5
Plan Total Prescribed Dose: 15 Gy
Reference Point Dosage Given to Date: 6 Gy
Reference Point Session Dosage Given: 3 Gy
Session Number: 12

## 2023-07-27 NOTE — NC FL2 (Addendum)
[address] MEDICAID FL2 LEVEL OF CARE FORM     IDENTIFICATION  Patient Name: Tabitha Ramirez Birthdate: 03/22/1946 Sex: female Admission Date (Current Location): 07/24/2023  [facility], [place] and [place] Number:  Producer, television/film/video and Address:  [facility], [address] [place], [place] [address]      Provider Number: [id number]  Attending Physician Name and Address:  Mercie Eon, MD  Relative Name and Phone Number:       Current Level of Care: Hospital Recommended Level of Care: Skilled Nursing Facility Prior Approval Number:    Date Approved/Denied:   PASRR Number: [id number]  Discharge Plan: SNF    Current Diagnoses: Patient Active Problem List   Diagnosis Date Noted   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) 07/26/2023   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   Facial twitching 06/22/2023   COVID-19 05/25/2023   Seizures (HCC) 04/03/2023   Hypothyroidism 04/03/2023   Cancer of right breast metastatic to brain [facility]) 04/13/2022   Brain tumor (HCC) 03/17/2022   Palliative care encounter    Brain mass 03/09/2022   Seizure (HCC) 03/09/2022   Neuropathy 06/11/2014   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   Gait abnormality 01/26/2014   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   Neuropathic pain 01/20/2014   Anxiety 01/20/2014   Deficiency anemia 01/20/2014   Hx of neutropenia 08/04/2013   Jaw pain 08/04/2013   Leukocytosis 04/27/2013   Pericardial effusion 04/27/2013   Malignant neoplasm of female breast (HCC) 02/04/2013   Need for influenza vaccination 11/29/2011   HLD (hyperlipidemia) 03/07/2011   Post-menopausal 03/07/2011   Anemia 12/17/2010   Heart murmur 12/17/2010   Seasonal allergies 12/17/2010   HYPOKALEMIA 02/19/2009   Essential hypertension, benign 04/09/2008    Orientation RESPIRATION BLADDER Height & Weight      Time, Situation, Self    Incontinent Weight: 120 lb 13 oz (54.8 kg) Height:  5\' 3"  (160 cm)  BEHAVIORAL SYMPTOMS/MOOD NEUROLOGICAL BOWEL NUTRITION STATUS      Continent Diet (Regular)  AMBULATORY STATUS COMMUNICATION OF NEEDS Skin   Extensive Assist Verbally Normal                       Personal Care Assistance Level of Assistance  Bathing, Feeding, Dressing Bathing Assistance: Maximum assistance Feeding assistance: Limited assistance Dressing Assistance: Maximum assistance     Functional Limitations Info  Sight Sight Info: Impaired        SPECIAL CARE FACTORS FREQUENCY  PT (By licensed PT), OT (By licensed OT)     PT Frequency: 5x/week OT Frequency: 5x/week            Contractures      Additional Factors Info  Allergies Code Status Info: DNR Allergies Info: Penicillins, Nsaids           Current Medications (07/27/2023):  This is the current hospital active medication list Current Facility-Administered Medications  Medication Dose Route Frequency Provider Last Rate Last Admin   acetaminophen (TYLENOL) tablet 650 mg  650 mg Oral Q6H PRN Atway, Rayann N, DO       Or   acetaminophen (TYLENOL) suppository 650 mg  650 mg Rectal Q6H PRN Atway, Rayann N, DO       anastrozole (ARIMIDEX) tablet 1 mg  1 mg Oral Daily Atway, Rayann N, DO   1 mg at 07/27/23 1125   dexamethasone (DECADRON) tablet 4 mg  4 mg Oral Daily Atway, Rayann N, DO   4 mg at 07/27/23 1124   ferrous sulfate tablet 325 mg  325 mg Oral Daily Peterson Ao, MD   325 mg at 07/27/23 1124   lacosamide (VIMPAT) tablet 150 mg  150 mg Oral BID Bhagat, Srishti L, MD   150 mg at 07/27/23 1124   levETIRAcetam (KEPPRA) tablet 2,000 mg  2,000 mg Oral BID Bhagat, Srishti L, MD   2,000 mg at 07/27/23 1124   levothyroxine (SYNTHROID) tablet 100 mcg  100 mcg Oral Q0600 Atway, Rayann N, DO   100 mcg at 07/27/23 1610   LORazepam (ATIVAN) injection 2 mg  2 mg Intravenous Q4H PRN Peterson Ao, MD        ondansetron (ZOFRAN-ODT) disintegrating tablet 4 mg  4 mg Oral Q6H PRN Atway, Rayann N, DO       Oral care mouth rinse  15 mL Mouth Rinse PRN Elgergawy, Leana Roe, MD       oxyCODONE-acetaminophen (PERCOCET/ROXICET) 5-325 MG per tablet 1 tablet  1 tablet Oral QID PRN Atway, Rayann N, DO   1 tablet at 07/26/23 0601   potassium chloride (KLOR-CON M) CR tablet 10 mEq  10 mEq Oral Daily Atway, Rayann N, DO   10 mEq at 07/27/23 1124   rivaroxaban (XARELTO) tablet 10 mg  10 mg Oral Daily Atway, Rayann N, DO   10 mg at 07/27/23 1124   rosuvastatin (CRESTOR) tablet 10 mg  10 mg Oral QPM Atway, Rayann N, DO   10 mg at 07/26/23 1739   triamterene-hydrochlorothiazide (MAXZIDE-25) 37.5-25 MG per tablet 1 tablet  1 tablet Oral Daily Atway, Rayann N, DO   1 tablet at 07/27/23 1124     Discharge Medications: Please see discharge summary for a list of discharge medications.  Relevant Imaging Results:  Relevant Lab Results:   Additional Information SS#: [id number]  Antion [name] Emory, Student-Social Work

## 2023-07-27 NOTE — Progress Notes (Signed)
Pt returned to unit.

## 2023-07-27 NOTE — Progress Notes (Signed)
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 She is admitted to the IM teaching service.  O/N events:  Long term EEG completed, appreciate the recommendations from neurology.  Pt feels "okay" this morning. Denies fever. Endorses chills. Denies shortness of breath, chest pain, nausea/vomiting. No seizures overnight. Plan is to discharge to the same rehab facility she was at previously [facility]). TOC group is getting her back to the facility, hopefully on Monday 11/6.   Objective:  Vital signs in last 24 hours: Vitals:   07/27/23 0053 07/27/23 0327 07/27/23 0833 07/27/23 1150  BP: (!) 121/91 134/73 119/73 108/72  Pulse: 70 66 69 74  Resp: 18 16 17 17   Temp: 98.1 F (36.7 C) 97.7 F (36.5 C) 98.3 F (36.8 C) (!) 97.5 F (36.4 C)  TempSrc: Oral Oral Oral Oral  SpO2: 100% 100% 100% (!) 88%  Weight:      Height:       Physical Exam Vitals and nursing note reviewed.  Constitutional:      Appearance: She is ill-appearing.  HENT:     Head: Normocephalic.     Mouth/Throat:     Mouth: Mucous membranes are moist.  Eyes:     Pupils: Pupils are equal, round, and reactive to light.  Cardiovascular:     Rate and Rhythm: Bradycardia present.  Pulmonary:     Effort: Pulmonary effort is normal.     Breath sounds: Normal breath sounds.  Abdominal:     General: There is no distension.     Palpations: Abdomen is soft.     Tenderness: There is no abdominal tenderness.  Skin:    General: Skin is warm and dry.  Neurological:     General: No focal deficit present.     Mental Status: She is alert and oriented to person, place, and time.  Psychiatric:        Attention and Perception: Attention normal.        Mood and Affect: Mood is depressed.         Behavior: Behavior is slowed and withdrawn. Behavior is cooperative.        Thought Content: Thought content normal.        Cognition and Memory: Cognition normal.        Judgment: Judgment normal.     Comments: Pt reports "feeling foggy". Thinking appears linear upon exam.     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   While at the oncologist office on 10/29, she had a cluster of 4 focal seizures  characterized by left-sided posturing left gaze deviation and impaired awareness.  Multiple seizures were treated with 2 mg IM Ativan at the clinic. She was transferred to [facility] ED CT head w/o acute process, right parietal resection cavity and extra axial metastatic disease along the right parietal region consistent with MRI obtained on 07/17/23. Neurology consulted and increased home vimpat to 150 mg BID and continue home keppra at 2000 mg BID. Pt has poor prognosis and will be hard to control the seizures per neuro. EEG w/ moderate diffuse encephalopathy.   Patient oriented x4 this morning, still soft spoken. Patient previously at [facility] rehab facility in October 2024 after recurrent seizures and transitioned to live with family. TOC consulted for  skilled nursing facility placement. Likely to discharge Monday 11/5. Pt will be transported to [facility] once discharged    - Neurology following, appreciate recs, signed off   - Keppra level 120  - vimpat level pending - Continue Keppra at 2,000 mg BID  - Continue Increased Vimpat to 150 mg BID, can increase to 200 mg BID if seizures occur  - At d/c intranasal Nayzilam 5 mg or valtoco 15 mg for seizure lasting more than 2 minutes, daughter prefers Nayzilam  - Continue Dexamethasone 4 mg daily - Seizure precautions - Likely to discharge Monday 11/5 to [facility]. - While inpatient, give 2 mg IV Ativan STAT for seizure activity lasting > 5 minutes and notify neurology   # Metastatic breast cancer.  Invasive ductal carcinoma grade 3 diagnosed in 01/2013.  Status post neoadjuvant chemotherapy, lumpectomy and radiation therapy in 2014-2015. On 06/23, patient had strokelike symptoms; left facial droop, left leg drift and decreased sensation apraxia.  MRI at that time showed irregular enhancing mass in the right parietal lobe.  She underwent craniotomy with tumor resection, with pathology showing metastatic carcinoma compatible with patient's known breast cancer.  She is currently getting whole brain radiation therapy.  Patient's oncologist, Dr. Barbaraann Cao also recommended Xarelto 10 mg for anticoagulation.  - Palliative following, appreciate recs - Continue Anastrozole 1 mg. - Continue Xarelto 10 mg.   Anemia of chronic disease. Pt with history and admission 11.1 hgb. Hgb not measured today. Stable, no overt signs of bleeding  - Continue home ferrous sulfate daily    Chronic stable medical conditions   # Hypertension -BP 96-134 past 24 hours, mild hypertension controlled. Home medication is Triamterene-HCTZ 37.5-25 mg  - Continue home medication    # Hyperlipidemia Patient with past history, home medication of Crestor 10 mg daily   - Continue home medication  - Consider discontinuing statin.   #  Hypothyroidism Patient with history and home medication of synthroid 100 mcg. - Continue home med    # Hypokalemia Patient 3.4 on admission. Takes potassium supplementation at home. No labs today.  - Continue Kclor   # Nausea -Continue Zofran 4 mg prn    # Chronic pain Continue home Percocet 5-325 mg PRN     Diet: Regular Diet  VTE: Xeralto IVF: None  Code: DNR/DNI  Prior to Admission Living Arrangement: Home with daughter Anticipated Discharge Location: [facility] Barriers to Discharge: Insurance clearance. Dispo: Anticipated discharge in approximately 3 day(s).   Margaretmary Dys, MD 07/27/2023, 2:30 PM After 5pm on weekdays and 1pm on weekends: On Call pager [phone]

## 2023-07-27 NOTE — TOC Initial Note (Signed)
Transition of Care [facility]) - Initial/Assessment Note    Patient Details  Name: Tabitha Ramirez MRN: [id number] Date of Birth: 25-Feb-1946  Transition of Care [facility]) CM/SW Contact:    Baldemar Lenis, LCSW Phone Number: 07/27/2023, 3:21 PM  Clinical Narrative:      CSW noting per chart review recommendation for SNF, family preference for [facility]. CSW discussed with MD about plan for radiation, and spoke with [facility] to determine if they would be able to offer a bed. Patient had been receiving daily radiation while at [facility], so they would be willing to take the patient back for another rehab stay. CSW completed referral and sent to [facility], updated medical team that patient could possibly admit on Monday pending insurance authorization. CSW sent request to CMA to initiate authorization request. CSW attempted to reach daughter, [name], to update and left a voicemail. CSW to follow.       UPDATE: CSW spoke with daughter, [name], to discuss discharge plan and answer questions. She is in agreement. CSW to follow.   Expected Discharge Plan: Skilled Nursing Facility Barriers to Discharge: Continued Medical Work up, English as a second language teacher, Other (must enter comment) (daily radiation treatments)   Patient Goals and CMS Choice Patient states their goals for this hospitalization and ongoing recovery are:: to feel better CMS Medicare.gov Compare Post Acute Care list provided to:: Patient Choice offered to / list presented to : Patient, Adult Children [place] ownership interest in [facility].provided to:: Adult Children    Expected Discharge Plan and Services     Post Acute Care Choice: Skilled Nursing Facility Living arrangements for the past 2 months: Single Family Home                                      Prior Living Arrangements/Services Living arrangements for the past 2 months: Single Family Home Lives with:: Self Patient language and need for  interpreter reviewed:: No Do you feel safe going back to the place where you live?: Yes      Need for Family Participation in Patient Care: No (Comment) Care giver support system in place?: No (comment)   Criminal Activity/Legal Involvement Pertinent to Current Situation/Hospitalization: No - Comment as needed  Activities of Daily Living   ADL Screening (condition at time of admission) Independently performs ADLs?: No Does the patient have a NEW difficulty with bathing/dressing/toileting/self-feeding that is expected to last >3 days?: No Does the patient have a NEW difficulty with getting in/out of bed, walking, or climbing stairs that is expected to last >3 days?: No Does the patient have a NEW difficulty with communication that is expected to last >3 days?: No Is the patient deaf or have difficulty hearing?: No Does the patient have difficulty seeing, even when wearing glasses/contacts?: No Does the patient have difficulty concentrating, remembering, or making decisions?: Yes  Permission Sought/Granted Permission sought to share information with : Facility Medical sales representative, Family Supports Permission granted to share information with : Yes, Verbal Permission Granted  Share Information with NAME: [name]  Permission granted to share info w AGENCY: SNF  Permission granted to share info w Relationship: Daughter     Emotional Assessment Appearance:: Appears stated age Attitude/Demeanor/Rapport: Engaged Affect (typically observed): Appropriate Orientation: : Oriented to Self, Oriented to Place, Oriented to  Time, Oriented to Situation Alcohol / Substance Use: Not Applicable Psych Involvement: No (comment)  Admission diagnosis:  Status epilepticus (HCC) [  G40.901] Seizure [facility]) [R56.9] Patient Active Problem List   Diagnosis Date Noted   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) 07/26/2023   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   Facial twitching  06/22/2023   COVID-19 05/25/2023   Seizures (HCC) 04/03/2023   Hypothyroidism 04/03/2023   Cancer of right breast metastatic to brain [facility]) 04/13/2022   Brain tumor (HCC) 03/17/2022   Palliative care encounter    Brain mass 03/09/2022   Seizure (HCC) 03/09/2022   Neuropathy 06/11/2014   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   Gait abnormality 01/26/2014   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   Neuropathic pain 01/20/2014   Anxiety 01/20/2014   Deficiency anemia 01/20/2014   Hx of neutropenia 08/04/2013   Jaw pain 08/04/2013   Leukocytosis 04/27/2013   Pericardial effusion 04/27/2013   Malignant neoplasm of female breast (HCC) 02/04/2013   Need for influenza vaccination 11/29/2011   HLD (hyperlipidemia) 03/07/2011   Post-menopausal 03/07/2011   Anemia 12/17/2010   Heart murmur 12/17/2010   Seasonal allergies 12/17/2010   HYPOKALEMIA 02/19/2009   Essential hypertension, benign 04/09/2008   PCP:  [facility]:   [phone] - Closed - [place], [place] - [address] [place] [address] Phone: [phone] Fax: [phone]  [id number] [place], [place] - [address] AT [facility] [address] [place] [address] Phone: [phone] Fax: [phone]  [facility] [address] [place] [address] Phone: [phone] Fax: [phone]     Social Determinants of Health (SDOH) Social History: SDOH Screenings   Food Insecurity: No Food Insecurity (07/24/2023)  Housing: Low Risk  (07/24/2023)  Transportation Needs: No Transportation Needs (07/24/2023)  Utilities: Not At Risk (07/24/2023)  Depression (PHQ2-9): Low Risk  (12/27/2022)  Tobacco Use: Medium Risk (07/24/2023)   SDOH Interventions:     Readmission Risk  Interventions     No data to display

## 2023-07-27 NOTE — Progress Notes (Signed)
Palliative:  Attempted follow up with patient however at radiation.   Will request PMT follow up in coming days - patient also followed by outpatient palliative at cancer center.   Gerlean Ren, DNP, AGNP-C Palliative Medicine Team Team Phone # [phone]  Pager # [phone]  NO CHARGE

## 2023-07-27 NOTE — Progress Notes (Signed)
[facility] Just rolled off unit with pt.pt in transport to [facility] for Radiation therapy.

## 2023-07-27 NOTE — Plan of Care (Signed)
  Problem: Education: Goal: Knowledge of General Education information will improve Description: Including pain rating scale, medication(s)/side effects and non-pharmacologic comfort measures Outcome: Progressing   Problem: Health Behavior/Discharge Planning: Goal: Ability to manage health-related needs will improve Outcome: Progressing   Problem: Clinical Measurements: Goal: Ability to maintain clinical measurements within normal limits will improve Outcome: Progressing   Problem: Activity: Goal: Risk for activity intolerance will decrease Outcome: Progressing   Problem: Nutrition: Goal: Adequate nutrition will be maintained Outcome: Progressing   Problem: Education: Goal: Expressions of having a comfortable level of knowledge regarding the disease process will increase Outcome: Progressing   Problem: Coping: Goal: Ability to adjust to condition or change in health will improve Outcome: Progressing Goal: Ability to identify appropriate support needs will improve Outcome: Progressing   Problem: Health Behavior/Discharge Planning: Goal: Compliance with prescribed medication regimen will improve Outcome: Progressing   Problem: Medication: Goal: Risk for medication side effects will decrease Outcome: Progressing   Problem: Clinical Measurements: Goal: Complications related to the disease process, condition or treatment will be avoided or minimized Outcome: Progressing Goal: Diagnostic test results will improve Outcome: Progressing   Problem: Safety: Goal: Verbalization of understanding the information provided will improve Outcome: Progressing

## 2023-07-27 NOTE — Evaluation (Signed)
Occupational Therapy Evaluation Patient Details Name: Tabitha Ramirez MRN: [id number] DOB: 09-26-1945 Today's Date: 07/27/2023   History of Present Illness Pt is a 77 y.o. female who presented 07/24/23 with breakthrough seizures. PMH: Metastatic breast cancer to the brain s/p right parietal craniotomy and tumor resection on 03/11/2022, seizures, HLD, HTN, GERD, hypothyroidism, heart murmur, anemia, currently undergoing whole brain radiation   Clinical Impression   Beena was evaluated s/p the above admission list. Per her report she needs assist for LB ADLs, bathing, IADLs and ambulates short distances with a RW at baseline. Upon evaluation the pt was limited by weakness, impaired cognition, L inattention?, poor sitting balance with significant posterior and L lateral lean and poor activity tolerance. Overall she needed mod/max A for bed mobility and min-max A for sitting balance. Due to the deficits listed below the pt also needs up to total A for LB ADLs and mod A for UB ADLs. BLE lotion donned while sitting EOB to elicit forward posture as a precursor to standing attempts. Pt unable to fully stand with max A. Pt will benefit from continued acute OT services and intensive inpatient follow up therapy, >3 hours/day after discharge.         If plan is discharge home, recommend the following: Two people to help with walking and/or transfers;A lot of help with walking and/or transfers;A lot of help with bathing/dressing/bathroom;Two people to help with bathing/dressing/bathroom    Functional Status Assessment  Patient has had a recent decline in their functional status and demonstrates the ability to make significant improvements in function in a reasonable and predictable amount of time.  Equipment Recommendations  Wheelchair cushion (measurements OT);Wheelchair (measurements OT);Hoyer lift    Recommendations for [facility] consult     Precautions / Restrictions  Precautions Precautions: Fall Precaution Comments: seizures Restrictions Weight Bearing Restrictions: No      Mobility Bed Mobility Overal bed mobility: Needs Assistance Bed Mobility: Supine to Sit, Sit to Supine     Supine to sit: HOB elevated, Used rails, Mod assist Sit to supine: HOB elevated, Max assist   General bed mobility comments: step by step cues needed    Transfers Overall transfer level: Needs assistance Equipment used: 1 person hand held assist Transfers: Sit to/from Stand Sit to Stand: Max assist           General transfer comment: max A, pt unable to get fully upright.      Balance Overall balance assessment: Needs assistance Sitting-balance support: Single extremity supported, Bilateral upper extremity supported, Feet supported Sitting balance-Leahy Scale: Poor Sitting balance - Comments: min-max A to correct balance Postural control: Posterior lean, Left lateral lean Standing balance support: Bilateral upper extremity supported Standing balance-Leahy Scale: Zero                             ADL either performed or assessed with clinical judgement   ADL Overall ADL's : Needs assistance/impaired Eating/Feeding: Set up;Supervision/ safety   Grooming: Supervision/safety;Modified independent;Minimal assistance Grooming Details (indicate cue type and reason): donned lotion on BLEs to elecit forward posture with mod A Upper Body Bathing: Moderate assistance   Lower Body Bathing: Maximal assistance   Upper Body Dressing : Moderate assistance   Lower Body Dressing: Maximal assistance   Toilet Transfer: Total assistance;Stand-pivot   Toileting- Clothing Manipulation and Hygiene: Total assistance       Functional mobility during ADLs: Total assistance (for OOB) General ADL Comments: significant  cues needed for all tasks, poor sittign balance with posterior and L lateral lean. L inattention.     Vision Baseline Vision/History: 0 No  visual deficits Additional Comments: did not assess     Perception Perception: Not tested       Praxis Praxis: Not tested       Pertinent Vitals/Pain Pain Assessment Pain Assessment: No/denies pain     Extremity/Trunk Assessment Upper Extremity Assessment Upper Extremity Assessment: RUE deficits/detail;LUE deficits/detail RUE Deficits / Details: generally weak, milding impaired coordination noted during grooming tasks LUE Deficits / Details: hx of prior CVA reporting decreased control/coordination at times but AROM WFL. Seeminly L inattention; L hand resting wtih awkward felxed posturing, pt also leaving LUE in unsafe positions. LUE Sensation: decreased proprioception   Lower Extremity Assessment Lower Extremity Assessment: Defer to PT evaluation   Cervical / Trunk Assessment Cervical / Trunk Assessment: Normal   Communication Communication Communication: Other (comment)   Cognition Arousal: Alert Behavior During Therapy: Flat affect Overall Cognitive Status: Impaired/Different from baseline Area of Impairment: Attention, Following commands, Safety/judgement, Awareness, Problem solving                   Current Attention Level: Selective   Following Commands: Follows one step commands consistently Safety/Judgement: Decreased awareness of deficits, Decreased awareness of safety Awareness: Intellectual Problem Solving: Slow processing, Decreased initiation, Requires verbal cues, Difficulty sequencing General Comments: slowed processing, limited insight. Needed simple 1 step cues to complete all basic tasks. LImited initiation but able to sustain attention to task once started. Unaware of posterior/L lean.     General Comments  VSS. no family present            Home Living Family/patient expects to be discharged to:: Private residence Living Arrangements: Children Available Help at Discharge: Family;Available 24 hours/day Type of Home: House Home Access:  Stairs to enter;Ramped entrance Entrance Stairs-Number of Steps: 6 Entrance Stairs-Rails: Can reach both Home Layout: One level     Bathroom Shower/Tub: Chief Strategy Officer: Standard Bathroom Accessibility: Yes How Accessible: Accessible via walker Home Equipment: Rolling Walker (2 wheels);Cane - single point;BSC/3in1;Grab bars - tub/shower;Hand held shower head;Tub bench;Shower seat;Wheelchair - manual          Prior Functioning/Environment Prior Level of Function : Needs assist             Mobility Comments: Was ambulatory with RW and CGA before d/c from [facility] 07/14/23, each time she has a seizure she starts over having to learn to walk, otherwise she is dependent on family to assist with stand pivot transfers to/from the w/c - denies falls at home ADLs Comments: Each time she has a seizure she becomes reliant on family to assist with all ADLs then she recovers and begins to bathe and dress self again - prior to admission, pt needed assist for LB ADLs, bathing adn all IADLs        OT Problem List: Decreased strength;Decreased activity tolerance;Impaired balance (sitting and/or standing);Decreased coordination;Decreased cognition;Decreased safety awareness;Decreased knowledge of use of DME or AE;Impaired UE functional use      OT Treatment/Interventions: Self-care/ADL training;Therapeutic exercise;Energy conservation;DME and/or AE instruction;Therapeutic activities;Patient/family education    OT Goals(Current goals can be found in the care plan section) Acute Rehab OT Goals Patient Stated Goal: to get back home OT Goal Formulation: With patient Time For Goal Achievement: 07/06/23 Potential to Achieve Goals: Good ADL Goals Pt Will Perform Grooming: with set-up Pt Will Perform Upper Body Dressing: with  set-up Pt Will Perform Lower Body Dressing: with mod assist;sit to/from stand Pt Will Transfer to Toilet: with mod assist;stand pivot transfer;bedside  commode Additional ADL Goal #1: Pt will sustain sitting balance for >5 minutes during functional activities  OT Frequency: Min 1X/week       AM-PAC OT "6 Clicks" Daily Activity     Outcome Measure Help from another person eating meals?: A Little Help from another person taking care of personal grooming?: A Little Help from another person toileting, which includes using toliet, bedpan, or urinal?: Total Help from another person bathing (including washing, rinsing, drying)?: A Lot Help from another person to put on and taking off regular upper body clothing?: A Lot Help from another person to put on and taking off regular lower body clothing?: Total 6 Click Score: 12   End of Session Equipment Utilized During Treatment: Gait belt Nurse Communication: Mobility status  Activity Tolerance: Patient tolerated treatment well Patient left: in bed;with call bell/phone within reach;with bed alarm set;with nursing/sitter in room  OT Visit Diagnosis: Unsteadiness on feet (R26.81);Other abnormalities of gait and mobility (R26.89);Muscle weakness (generalized) (M62.81);Other symptoms and signs involving cognitive function                Time: 1610-9604 OT Time Calculation (min): 16 min Charges:  OT General Charges $OT Visit: 1 Visit OT Evaluation $OT Eval Moderate Complexity: 1 Mod  Derenda Mis, OTR/L Acute Rehabilitation Services Office [phone] Secure Chat Communication Preferred   Donia Pounds 07/27/2023, 12:01 PM

## 2023-07-28 DIAGNOSIS — R569 Unspecified convulsions: Secondary | ICD-10-CM | POA: Diagnosis not present

## 2023-07-28 DIAGNOSIS — C50911 Malignant neoplasm of unspecified site of right female breast: Secondary | ICD-10-CM | POA: Diagnosis not present

## 2023-07-28 DIAGNOSIS — C7931 Secondary malignant neoplasm of brain: Secondary | ICD-10-CM | POA: Diagnosis not present

## 2023-07-28 NOTE — Progress Notes (Signed)
                 Interval history No concerns since yesterday.  Got better rest yesterday.  Hopeful to stop doing so much moving from facility to facility.  Physical exam Blood pressure 127/80, pulse 66, temperature 98.2 F (36.8 C), temperature source Oral, resp. rate 17, height 5\' 3"  (1.6 m), weight 54.8 kg, SpO2 97%.  No distress Heart rate is normal and regular, radial pulses strong Breathing comfortably on room air Skin is warm and dry Alert, answering simple questions and following simple instructions  Assessment and plan Hospital day 3  Tabitha Ramirez is a 77 y.o. 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  Principal Problem:   Seizure (HCC) Active Problems:   Essential hypertension, benign   Anemia   HLD (hyperlipidemia)   Malignant neoplasm of female breast (HCC)   Cancer of right breast metastatic to brain [facility])   Hypothyroidism   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 [facility])  Epilepsy with breakthrough seizure Stable, no more seizures since admission.  Adherent to levetiracetam evidenced by serum drug levels.  Lacosamide levels pending.  Outpatient levetiracetam continued, lacosamide was increased per below.  Need to follow-up regarding duration of dexamethasone. 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  Next treatment on Monday.  Hopeful that she can be discharged to treatment and then [facility]. - Rivaroxaban for VTE prophylaxis - Anastrozole 1 mg daily  Hypertension Continue outpatient  triamterene.  Hyperlipidemia Outpatient statin continued.  Hypothyroid Outpatient Synthroid continued.  Iron deficiency anemia Continue iron sulfate.  Supportive care As needed Zofran for nausea and outpatient Percocet for pain.  Diet: Regular IVF: N/A VTE: rivaroxaban (XARELTO) tablet 10 mg Start: 07/25/23 1000  Code: DNR PT/OT recommendations: SNF for subacute rehab TOC recommendations: Following Family Update: N/A  Discharge plan: Monday, pending insurance authorization for SNF.  Marrianne Mood MD 07/28/2023, 12:52 PM  Pager: [phone] After 5pm or weekend: [phone]

## 2023-07-28 NOTE — Plan of Care (Signed)
  Problem: Education: Goal: Knowledge of General Education information will improve Description: Including pain rating scale, medication(s)/side effects and non-pharmacologic comfort measures Outcome: Progressing   Problem: Health Behavior/Discharge Planning: Goal: Ability to manage health-related needs will improve Outcome: Progressing   Problem: Activity: Goal: Risk for activity intolerance will decrease Outcome: Progressing   Problem: Nutrition: Goal: Adequate nutrition will be maintained Outcome: Progressing   Problem: Pain Management: Goal: General experience of comfort will improve Outcome: Progressing   Problem: Education: Goal: Expressions of having a comfortable level of knowledge regarding the disease process will increase Outcome: Progressing   Problem: Coping: Goal: Ability to adjust to condition or change in health will improve Outcome: Progressing   Problem: Medication: Goal: Risk for medication side effects will decrease Outcome: Progressing

## 2023-07-28 NOTE — Progress Notes (Signed)
Pt experienced a fall while trying to stand on her own on [facility]. MD notified. Night Internal med are to come eval. Orders placed. Daughter [name] notified.

## 2023-07-28 NOTE — Progress Notes (Signed)
Paged by nursing staff saying patient had a fall and hit her head. She was trying to get out of bed, and fell forward. She is alert and orientated, however she does state that she's at [facility], and does state the date and year is 10/25. Neuro exam without any abnormalities with Cranial nerves in tact, she moves all extremities spontaneously, and strength testing is within normal limits in all extremities.   She does have a large bruise on her left forehead that is tender to palpation.   Plan:  - CT Head  - Tylenol for pain control - Fall precautions

## 2023-07-29 ENCOUNTER — Inpatient Hospital Stay (HOSPITAL_COMMUNITY): Payer: Medicare PPO

## 2023-07-29 NOTE — Plan of Care (Signed)
  Problem: Health Behavior/Discharge Planning: Goal: Ability to manage health-related needs will improve Outcome: Progressing   Problem: Clinical Measurements: Goal: Ability to maintain clinical measurements within normal limits will improve Outcome: Progressing   Problem: Activity: Goal: Risk for activity intolerance will decrease Outcome: Progressing   Problem: Nutrition: Goal: Adequate nutrition will be maintained Outcome: Progressing   Problem: Pain Management: Goal: General experience of comfort will improve Outcome: Progressing   Problem: Medication: Goal: Risk for medication side effects will decrease Outcome: Progressing   Problem: Clinical Measurements: Goal: Complications related to the disease process, condition or treatment will be avoided or minimized Outcome: Progressing   Problem: Safety: Goal: Verbalization of understanding the information provided will improve Outcome: Progressing

## 2023-07-29 NOTE — Plan of Care (Signed)
Pt alert and oriented x 3. Denies any pain or discomfort. IV is patent and saline locked. Continent of bowel and bladder. Daughter to bedside this shift. Will continue with current plan of care.  Problem: Education: Goal: Knowledge of General Education information will improve Description: Including pain rating scale, medication(s)/side effects and non-pharmacologic comfort measures Outcome: Progressing   Problem: Health Behavior/Discharge Planning: Goal: Ability to manage health-related needs will improve Outcome: Progressing   Problem: Clinical Measurements: Goal: Ability to maintain clinical measurements within normal limits will improve Outcome: Progressing   Problem: Activity: Goal: Risk for activity intolerance will decrease Outcome: Progressing   Problem: Nutrition: Goal: Adequate nutrition will be maintained Outcome: Progressing   Problem: Coping: Goal: Level of anxiety will decrease Outcome: Progressing   Problem: Pain Management: Goal: General experience of comfort will improve Outcome: Progressing   Problem: Education: Goal: Expressions of having a comfortable level of knowledge regarding the disease process will increase Outcome: Progressing   Problem: Coping: Goal: Ability to adjust to condition or change in health will improve Outcome: Progressing Goal: Ability to identify appropriate support needs will improve Outcome: Progressing   Problem: Health Behavior/Discharge Planning: Goal: Compliance with prescribed medication regimen will improve Outcome: Progressing   Problem: Medication: Goal: Risk for medication side effects will decrease Outcome: Progressing   Problem: Clinical Measurements: Goal: Complications related to the disease process, condition or treatment will be avoided or minimized Outcome: Progressing Goal: Diagnostic test results will improve Outcome: Progressing   Problem: Safety: Goal: Verbalization of understanding the information  provided will improve Outcome: Progressing   Problem: Self-Concept: Goal: Level of anxiety will decrease Outcome: Progressing Goal: Ability to verbalize feelings about condition will improve Outcome: Progressing

## 2023-07-29 NOTE — Progress Notes (Signed)
Interval history Patient had a fall while getting out of bed last night at approximately 730 pm last night. Patient had no altered mental status or acute changes, but did hit her forehead. A CT was obtained to rule out a skull fracture. She has a bruise, but is otherwise well.   Physical exam Blood pressure 111/77, pulse 74, temperature 97.9 F (36.6 C), temperature source Oral, resp. rate 14, height 5\' 3"  (1.6 m), weight 54.8 kg, SpO2 100%.  No distress Heart rate is normal and regular, radial pulses strong Breathing comfortably on room air Skin is warm and dry Alert, answering simple questions and following simple instructions.  Physical Exam Vitals and nursing note reviewed.  Constitutional:      Appearance: She is ill-appearing.  HENT:     Head: Normocephalic.      Mouth/Throat:     Pharynx: Oropharynx is clear.  Eyes:     Extraocular Movements: Extraocular movements intact.     Pupils: Pupils are equal, round, and reactive to light.  Cardiovascular:     Rate and Rhythm: Normal rate and regular rhythm.     Pulses: Normal pulses.  Pulmonary:     Effort: Pulmonary effort is normal.     Breath sounds: Normal breath sounds.  Abdominal:     General: Abdomen is flat. Bowel sounds are normal.     Palpations: Abdomen is soft.  Musculoskeletal:     Right upper arm: Normal.     Comments: Left UE weakness, 2/5. Significant drift. BLE weakness, 2/5.   Skin:    General: Skin is warm and dry.  Neurological:     Mental Status: She is disoriented.     Cranial Nerves: No cranial nerve deficit.     Sensory: No sensory deficit.  Psychiatric:        Attention and Perception: Attention normal.        Mood and Affect: Mood is depressed.        Speech: Speech is delayed.        Behavior: Behavior normal. Behavior is cooperative.        Cognition and Memory: Memory normal. Cognition is impaired.        Judgment: Judgment normal.      CT Head w/o Contrast: 11/3  Soft tissue contusion at the left frontal scalp. No calvarial fracture.   Assessment and plan Hospital day 4  Tabitha Ramirez is a 76 y.o. 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 Requesting that TOC help set her up with an appointment for her palliative radiation therapy en route to her SNF tomorrow 11/4.   Principal Problem:   Seizure (HCC) Active Problems:   Essential hypertension, benign   Anemia   HLD (hyperlipidemia)   Malignant neoplasm of female breast (HCC)   Cancer of right breast metastatic to brain [facility])   Hypothyroidism   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 [facility])  Epilepsy with breakthrough seizure Stable, no more seizures since admission.  Adherent to levetiracetam evidenced by serum drug levels.  Lacosamide levels pending.  Outpatient levetiracetam continued, lacosamide was increased per below.  Need to follow-up regarding duration of dexamethasone. 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  Next treatment on Monday.  Hopeful that she can be discharged to treatment and then [facility] to [facility] medical facility transfers. - Rivaroxaban for VTE prophylaxis - Anastrozole 1 mg daily  Hypertension Continue outpatient triamterene.  Hyperlipidemia Outpatient statin continued.  Hypothyroid Outpatient Synthroid continued.  Iron deficiency anemia Continue iron sulfate.  Supportive care As needed Zofran for nausea and outpatient Percocet for pain.  Diet: Regular IVF: N/A VTE: rivaroxaban (XARELTO) tablet 10 mg Start: 07/25/23 1000  Code: DNR PT/OT  recommendations: SNF for subacute rehab TOC recommendations: Following Family Update: N/A  Discharge plan: Monday, pending insurance authorization for SNF.  Signed: Christie Nottingham, MD [facility] Health Physician, PGY-1 07/29/2023 9:40 AM

## 2023-07-30 ENCOUNTER — Ambulatory Visit: Payer: Medicare PPO

## 2023-07-30 ENCOUNTER — Other Ambulatory Visit: Payer: Self-pay

## 2023-07-30 ENCOUNTER — Other Ambulatory Visit (HOSPITAL_COMMUNITY): Payer: Self-pay

## 2023-07-30 ENCOUNTER — Ambulatory Visit
Admission: RE | Admit: 2023-07-30 | Discharge: 2023-07-30 | Disposition: A | Discharge status: Home / Self Care | Payer: Medicare PPO | Source: Ambulatory Visit | Attending: Radiation Oncology

## 2023-07-30 DIAGNOSIS — C50911 Malignant neoplasm of unspecified site of right female breast: Secondary | ICD-10-CM | POA: Diagnosis not present

## 2023-07-30 DIAGNOSIS — R569 Unspecified convulsions: Secondary | ICD-10-CM | POA: Diagnosis not present

## 2023-07-30 DIAGNOSIS — C7931 Secondary malignant neoplasm of brain: Secondary | ICD-10-CM | POA: Diagnosis not present

## 2023-07-30 LAB — RAD ONC ARIA SESSION SUMMARY
Course Elapsed Days: 26
Plan Fractions Treated to Date: 3
Plan Prescribed Dose Per Fraction: 3 Gy
Plan Total Fractions Prescribed: 5
Plan Total Prescribed Dose: 15 Gy
Reference Point Dosage Given to Date: 9 Gy
Reference Point Session Dosage Given: 3 Gy
Session Number: 13

## 2023-07-30 LAB — LACOSAMIDE: Lacosamide: 18.3 ug/mL — ABNORMAL HIGH (ref 5.0–10.0)

## 2023-07-30 MED ORDER — ACETAMINOPHEN 325 MG PO TABS: 325.0000 mg | ORAL_TABLET | Freq: Four times a day (QID) | ORAL | Status: DC | PRN

## 2023-07-30 MED ORDER — RIVAROXABAN 10 MG PO TABS: 10.0000 mg | ORAL_TABLET | Freq: Every day | ORAL | Status: AC

## 2023-07-30 MED ORDER — NAYZILAM 5 MG/0.1ML NA SOLN: 5.0000 mg | NASAL | Status: DC | PRN

## 2023-07-30 MED ORDER — POTASSIUM CHLORIDE ER 10 MEQ PO TBCR: 10.0000 meq | EXTENDED_RELEASE_TABLET | Freq: Every day | ORAL | Status: DC

## 2023-07-30 MED ORDER — LEVOTHYROXINE SODIUM 100 MCG PO TABS: 100.0000 ug | ORAL_TABLET | Freq: Every day | ORAL | Status: DC

## 2023-07-30 MED ORDER — ENSURE ENLIVE PO LIQD: 237.0000 mL | Freq: Two times a day (BID) | ORAL | Status: DC

## 2023-07-30 MED ORDER — ANASTROZOLE 1 MG PO TABS: 1.0000 mg | ORAL_TABLET | Freq: Every day | ORAL | Status: DC

## 2023-07-30 MED ORDER — LEVETIRACETAM 1000 MG PO TABS: 2000.0000 mg | ORAL_TABLET | Freq: Two times a day (BID) | ORAL | Status: DC

## 2023-07-30 MED ORDER — ONDANSETRON 4 MG PO TBDP: 4.0000 mg | ORAL_TABLET | Freq: Four times a day (QID) | ORAL | Status: DC | PRN

## 2023-07-30 MED ORDER — LACOSAMIDE 150 MG PO TABS: 150.0000 mg | ORAL_TABLET | Freq: Two times a day (BID) | ORAL | 11 refills | Status: DC

## 2023-07-30 MED ORDER — VITAMIN B-12 500 MCG PO TABS: 500.0000 ug | ORAL_TABLET | Freq: Every day | ORAL | Status: DC

## 2023-07-30 MED ORDER — LACOSAMIDE 150 MG PO TABS: 150.0000 mg | ORAL_TABLET | Freq: Two times a day (BID) | ORAL | Status: DC

## 2023-07-30 MED ORDER — DEXAMETHASONE 4 MG PO TABS: 4.0000 mg | ORAL_TABLET | Freq: Every day | ORAL | Status: DC

## 2023-07-30 NOTE — Progress Notes (Signed)
Attempt to call report at [phone]. Was transferred multiple times and ultimately the speaker requested a call back number for their staff to get report. I explained transport has been arranged and encouraged staff to return call for appropriate report before pt arrives.

## 2023-07-30 NOTE — Progress Notes (Signed)
Physical Therapy Treatment Patient Details Name: Tabitha Ramirez MRN: [id number] DOB: 1946/04/07 Today's Date: 07/30/2023   History of Present Illness Pt is a 77 y.o. female who presented 07/24/23 with breakthrough seizures. Pt fell forwards and hit her head on 11/2, imaging clear. PMH: Metastatic breast cancer to the brain s/p right parietal craniotomy and tumor resection on 03/11/2022, seizures, HLD, HTN, GERD, hypothyroidism, heart murmur, anemia, currently undergoing whole brain radiation    PT Comments  The pt was agreeable to session, continues to need modA to complete bed mobility and to maintain sitting balance at EOB. The pt appears more fearful of falling this session, leaning backwards and reaching for bed rail at all times. Pt is able to be re-directed to reaching task to work on trunk flexion and seated balance, but she requires frequent cues, encouragement, and support to reach outside BOS. Of note, the pt's daughter also mentions increased lateral eye "jumping" since fall, and given pt's reports of onset of dizziness and room spinning sensation with head movements I recommend vestibular specialist evaluation for possible vestibular involvement post-fall. The pt declined OOB mobility this session due to room spinning, will continue to benefit from skilled PT acutely and after d/c to maximize functional independence and activity tolerance.     If plan is discharge home, recommend the following: Two people to help with walking and/or transfers;A lot of help with bathing/dressing/bathroom;Assistance with cooking/housework;Direct supervision/assist for medications management;Direct supervision/assist for financial management;Assist for transportation;Help with stairs or ramp for entrance   Can travel by private vehicle     No  Equipment Recommendations  None recommended by PT    Recommendations for Other Services Rehab consult     Precautions / Restrictions Precautions Precautions:  Fall Precaution Comments: seizures Restrictions Weight Bearing Restrictions: No     Mobility  Bed Mobility Overal bed mobility: Needs Assistance Bed Mobility: Supine to Sit, Sit to Supine     Supine to sit: Min assist, HOB elevated, Used rails Sit to supine: Mod assist, HOB elevated   General bed mobility comments: Pt initiated bringing legs towards R EOB and reaching for R bed rail but needed minA to ascend trunk and scoot hips to EOB. ModA to direct trunk and legs back to supine.    Transfers                   General transfer comment: deferred due to pt's dizziness. Pt reports more room spinning while sitting. aggrivated by head movements. and pt requesting to return to supine       Balance Overall balance assessment: Needs assistance Sitting-balance support: Single extremity supported, Bilateral upper extremity supported, Feet supported Sitting balance-Leahy Scale: Poor Sitting balance - Comments: Reliant on UE support and min-modA for static sitting balance Postural control: Posterior lean                                  Cognition Arousal: Alert Behavior During Therapy: Flat affect Overall Cognitive Status: Impaired/Different from baseline Area of Impairment: Following commands, Awareness, Problem solving                 Orientation Level: Disoriented to, Situation, Time     Following Commands: Follows one step commands with increased time, Follows one step commands inconsistently Safety/Judgement: Decreased awareness of deficits, Decreased awareness of safety Awareness: Emergent Problem Solving: Slow processing, Decreased initiation, Difficulty sequencing, Requires verbal cues General Comments: Pt  slow to process cues and initiate movement. Unsure if pt heard therapist's cues at times. pt very fearful of falling, reaching for armrests at all times. scooting forwards when not cued. pt also with a few instances of word-finding errors.  stating [place]" when asked where she had pain. stating "[place]" when asked the month        Exercises Other Exercises Other Exercises: horizontal head turns sitting EOB x5 Other Exercises: seated forwards reach x5 with bilateral UE to grab a cup and hand to therapist and then to lean and push rolling table away. pt able to complete but returns to posterior lean when not directly cued to lean forawrds    General Comments General comments (skin integrity, edema, etc.): daughter present, concerned about pt reports of dizziness after the fall      Pertinent Vitals/Pain Pain Assessment Pain Assessment: Faces Faces Pain Scale: Hurts a little bit Pain Location: forehead Pain Descriptors / Indicators: Discomfort, Grimacing Pain Intervention(s): Limited activity within patient's tolerance, Monitored during session, Repositioned           PT Goals (current goals can now be found in the care plan section) Acute Rehab PT Goals Patient Stated Goal: to improve PT Goal Formulation: With patient/family Time For Goal Achievement: 08/09/23 Potential to Achieve Goals: Fair Progress towards PT goals: Progressing toward goals    Frequency    Min 1X/week       AM-PAC PT "6 Clicks" Mobility   Outcome Measure  Help needed turning from your back to your side while in a flat bed without using bedrails?: A Little Help needed moving from lying on your back to sitting on the side of a flat bed without using bedrails?: A Lot Help needed moving to and from a bed to a chair (including a wheelchair)?: A Lot Help needed standing up from a chair using your arms (e.g., wheelchair or bedside chair)?: A Lot Help needed to walk in hospital room?: Total Help needed climbing 3-5 steps with a railing? : Total 6 Click Score: 11    End of Session   Activity Tolerance: Patient tolerated treatment well;Other (comment) (dizziness) Patient left: in bed;with call bell/phone within reach;with bed alarm  set;with family/visitor present;Other (comment) (with MD team) Nurse Communication: Mobility status PT Visit Diagnosis: Unsteadiness on feet (R26.81);Muscle weakness (generalized) (M62.81);Difficulty in walking, not elsewhere classified (R26.2);Other symptoms and signs involving the nervous system (R29.898);Other abnormalities of gait and mobility (R26.89)     Time: 1610-9604 PT Time Calculation (min) (ACUTE ONLY): 23 min  Charges:    $Therapeutic Activity: 8-22 mins $Neuromuscular Re-education: 8-22 mins PT General Charges $$ ACUTE PT VISIT: 1 Visit                     Vickki Muff, PT, DPT   Acute Rehabilitation Department Office [phone] Secure Chat Communication Preferred   Ronnie Derby 07/30/2023, 12:34 PM

## 2023-07-30 NOTE — TOC Transition Note (Signed)
Transition of Care [facility]) - CM/SW Discharge Note   Patient Details  Name: Tabitha Ramirez MRN: [id number] Date of Birth: 09-29-1945  Transition of Care [facility]) CM/SW Contact:  Baldemar Lenis, LCSW Phone Number: 07/30/2023, 3:45 PM   Clinical Narrative:   CSW received update earlier today that insurance offered peer to peer. CSW sent peer to peer information to MD, MD completed and request for SNF was approved. CSW notified [facility], they can admit today. CSW updated MD, sent discharge summary to [facility]. Daughter, [name], updated and in agreement. Transport arranged with [facility] for next available.  Nurse to call report to [phone], Room 310.    Final next level of care: Skilled Nursing Facility Barriers to Discharge: Barriers Resolved   Patient Goals and CMS Choice CMS Medicare.gov Compare Post Acute Care list provided to:: Patient Choice offered to / list presented to : Patient, Adult Children  Discharge Placement                Patient [facility] bed at: [facility] Patient to be transferred to facility by: [facility] Name of family member notified: [name] Patient and family notified of of transfer: 07/30/23  Discharge Plan and Services Additional resources added to the After Visit Summary for       Post Acute Care Choice: Skilled Nursing Facility                               Social Determinants of Health (SDOH) Interventions SDOH Screenings   Food Insecurity: No Food Insecurity (07/24/2023)  Housing: Low Risk  (07/24/2023)  Transportation Needs: No Transportation Needs (07/24/2023)  Utilities: Not At Risk (07/24/2023)  Depression (PHQ2-9): Low Risk  (12/27/2022)  Tobacco Use: Medium Risk (07/24/2023)     Readmission Risk Interventions     No data to display

## 2023-07-30 NOTE — Care Management Important Message (Signed)
Important Message  Patient Details  Name: Tabitha Ramirez MRN: [id number] Date of Birth: 11/28/1945   Important Message Given:  Yes - Medicare IM     Dorena Bodo 07/30/2023, 3:18 PM

## 2023-07-30 NOTE — Plan of Care (Signed)
  Problem: Health Behavior/Discharge Planning: Goal: Ability to manage health-related needs will improve Outcome: Progressing   Problem: Clinical Measurements: Goal: Ability to maintain clinical measurements within normal limits will improve Outcome: Progressing   Problem: Activity: Goal: Risk for activity intolerance will decrease Outcome: Progressing   Problem: Coping: Goal: Level of anxiety will decrease Outcome: Progressing   Problem: Pain Management: Goal: General experience of comfort will improve Outcome: Progressing   Problem: Coping: Goal: Ability to adjust to condition or change in health will improve Outcome: Progressing Goal: Ability to identify appropriate support needs will improve Outcome: Progressing   Problem: Health Behavior/Discharge Planning: Goal: Compliance with prescribed medication regimen will improve Outcome: Progressing   Problem: Clinical Measurements: Goal: Complications related to the disease process, condition or treatment will be avoided or minimized Outcome: Progressing Goal: Diagnostic test results will improve Outcome: Progressing   Problem: Safety: Goal: Verbalization of understanding the information provided will improve Outcome: Progressing

## 2023-07-30 NOTE — Progress Notes (Signed)
Daily Progress Note   Patient Name: Tabitha Ramirez       Date: 07/30/2023 DOB: March 31, 1946  Age: 77 y.o. MRN#: [id number] Attending Physician: Dickie La, MD Primary Care Physician: Duard Larsen Primary Care Admit Date: 07/24/2023  Reason for Consultation/Follow-up: Establishing goals of care  Length of Stay: 5  Current Medications: Scheduled Meds:   anastrozole  1 mg Oral Daily   dexamethasone  4 mg Oral Daily   lacosamide  150 mg Oral BID   levETIRAcetam  2,000 mg Oral BID   levothyroxine  100 mcg Oral Q0600   rivaroxaban  10 mg Oral Daily    Continuous Infusions:   PRN Meds: acetaminophen **OR** acetaminophen, LORazepam, ondansetron, mouth rinse, oxyCODONE-acetaminophen  Physical Exam          Vital Signs: BP (!) 108/53 (BP Location: Left Arm)   Pulse 86   Temp 98.3 F (36.8 C) (Oral)   Resp 18   Ht 5\' 3"  (1.6 m)   Wt 54.8 kg   SpO2 100%   BMI 21.40 kg/m  SpO2: SpO2: 100 % O2 Device: O2 Device: Room Air O2 Flow Rate: O2 Flow Rate (L/min): 0 L/min   Patient Active Problem List   Diagnosis Date Noted   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) 07/26/2023   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   Facial twitching 06/22/2023   COVID-19 05/25/2023   Seizures (HCC) 04/03/2023   Hypothyroidism 04/03/2023   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) 04/13/2022   Brain tumor (HCC) 03/17/2022   Palliative care encounter    Brain mass 03/09/2022   Seizure (HCC) 03/09/2022   Neuropathy 06/11/2014   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   Gait abnormality 01/26/2014   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   Neuropathic pain 01/20/2014    Anxiety 01/20/2014   Deficiency anemia 01/20/2014   Hx of neutropenia 08/04/2013   Jaw pain 08/04/2013   Leukocytosis 04/27/2013   Pericardial effusion 04/27/2013   Malignant neoplasm of female breast (HCC) 02/04/2013   Need for influenza vaccination 11/29/2011   HLD (hyperlipidemia) 03/07/2011   Post-menopausal 03/07/2011   Anemia 12/17/2010   Heart murmur 12/17/2010   Seasonal allergies 12/17/2010   HYPOKALEMIA 02/19/2009   Essential hypertension, benign 04/09/2008  Palliative Care Assessment & Plan   Patient Profile: 77 y.o. female  with past medical history of metastatic breast cancer to the brain s/p right parietal craniotomy and tumor resection (2023), HLD, HTN, GERD, hypothyroidism, who is currently undergoing whole brain radiation admitted on 07/24/2023 with seizures.  Today's Discussion: Patient faces treatment option decisions, advanced directive decisions and anticipatory care needs. Spoke with daughter Tabitha Ramirez about options moving forward. The patient has just been accepted to [facility] while she completes her palliative radiation. We discussed options once the patient moves to comfort measures. Explained the philosophy of hospice and what comfort care would look like for this patient. Discussed hospice options including home hospice, inpatient hospice, and hospice at SNF. Discussed that SNF with hospice usually requires the patient to pay out of pocket depending on their insurance. Tabitha Ramirez was grateful for the information and also plans to discuss with Royal Hawthorn a palliative medicine NP they are familiar with at the cancer center.  Encouraged her to call PMT with questions or concerns.  Recommendations/Plan: DNR Limited Scope of Treatment: No intubation or feeding tube Encouraged continued discussion re: goals of care  Outpatient follow up with Royal Hawthorn with outpatient palliative Continued PMT support   Extensive chart review has been completed prior to  speaking with her daughter Tabitha Ramirez including labs, vital signs, imagine, progress/consult notes, orders, medications, and available advance directive documents.  Care plan was discussed with Dr. Weston Settle  Time spent: 35 minutes  Thank you for allowing the Palliative Medicine Team to assist in the care of this patient.    Sherryll Burger, NP  Please contact Palliative Medicine Team phone at [phone] for questions and concerns.

## 2023-07-30 NOTE — Progress Notes (Signed)
1557 patient transport to [facility] alert x2 on room air

## 2023-07-30 NOTE — Progress Notes (Addendum)
Another attempt to call report. After the staff member stated the receiving nurse was unavailable they again asked for a call back number. Call back number given. Explained importance of report prior to patient transfer. Staff stated they will try to call back but "it's ok to just transfer" the patient.

## 2023-07-30 NOTE — Progress Notes (Addendum)
1250 [facility] here to take patient to cancer center for treatment patient alert x3 on room air. 1400 patient back from radiation

## 2023-07-30 NOTE — Progress Notes (Signed)
Plan of Care Note for accepted transfer   Patient: Tabitha Ramirez MRN: [id number]   DOA: 07/24/2023  Facility requesting transfer: [facility] IM residency service. Requesting Provider: Modena Slater, MD/Grace Sol Blazing, MD. Reason for transfer: Right breast cancer undergoing whole brain radiation therapy. Facility course:  Please see IM teaching service notes for further details.  Plan of care: The patient is accepted for admission to Med-surg  unit, at [facility].  Author: Bobette Mo, MD 07/30/2023  Check [email or web address] for on-call coverage.  Nursing staff, Please call [facility] Admits & Consults System-Wide number on Amion as soon as patient's arrival, so appropriate admitting provider can evaluate the pt.

## 2023-07-30 NOTE — Plan of Care (Signed)
  Problem: Education: Goal: Knowledge of General Education information will improve Description: Including pain rating scale, medication(s)/side effects and non-pharmacologic comfort measures 07/30/2023 1116 by Aretta Nip, RN Outcome: Progressing 07/30/2023 1115 by Aretta Nip, RN Outcome: Progressing   Problem: Health Behavior/Discharge Planning: Goal: Ability to manage health-related needs will improve 07/30/2023 1116 by Aretta Nip, RN Outcome: Progressing 07/30/2023 1115 by Aretta Nip, RN Outcome: Progressing   Problem: Clinical Measurements: Goal: Ability to maintain clinical measurements within normal limits will improve 07/30/2023 1116 by Aretta Nip, RN Outcome: Progressing 07/30/2023 1115 by Aretta Nip, RN Outcome: Progressing   Problem: Activity: Goal: Risk for activity intolerance will decrease 07/30/2023 1116 by Aretta Nip, RN Outcome: Not Progressing Note: Max assist to dangle  07/30/2023 1115 by Aretta Nip, RN Outcome: Not Progressing Note: Patient weak unable to hold balance while sitting and patient max assist while standing    Problem: Nutrition: Goal: Adequate nutrition will be maintained 07/30/2023 1116 by Aretta Nip, RN Outcome: Not Progressing Note: Eats about 50% decrease oral intake  07/30/2023 1115 by Aretta Nip, RN Outcome: Progressing   Problem: Coping: Goal: Level of anxiety will decrease 07/30/2023 1116 by Aretta Nip, RN Outcome: Progressing 07/30/2023 1115 by Aretta Nip, RN Outcome: Progressing   Problem: Pain Management: Goal: General experience of comfort will improve 07/30/2023 1116 by Aretta Nip, RN Outcome: Progressing 07/30/2023 1115 by Aretta Nip, RN Outcome: Progressing   Problem: Education: Goal: Expressions of having a comfortable level of knowledge regarding the disease process will increase 07/30/2023 1116 by Aretta Nip, RN Outcome: Progressing 07/30/2023 1115 by  Aretta Nip, RN Outcome: Progressing   Problem: Coping: Goal: Ability to adjust to condition or change in health will improve 07/30/2023 1116 by Aretta Nip, RN Outcome: Progressing 07/30/2023 1115 by Aretta Nip, RN Outcome: Progressing Goal: Ability to identify appropriate support needs will improve 07/30/2023 1116 by Aretta Nip, RN Outcome: Progressing 07/30/2023 1115 by Aretta Nip, RN Outcome: Progressing   Problem: Health Behavior/Discharge Planning: Goal: Compliance with prescribed medication regimen will improve 07/30/2023 1116 by Aretta Nip, RN Outcome: Progressing 07/30/2023 1115 by Aretta Nip, RN Outcome: Progressing   Problem: Medication: Goal: Risk for medication side effects will decrease 07/30/2023 1116 by Aretta Nip, RN Outcome: Progressing 07/30/2023 1115 by Aretta Nip, RN Outcome: Progressing   Problem: Clinical Measurements: Goal: Complications related to the disease process, condition or treatment will be avoided or minimized 07/30/2023 1116 by Aretta Nip, RN Outcome: Progressing 07/30/2023 1115 by Aretta Nip, RN Outcome: Progressing Goal: Diagnostic test results will improve 07/30/2023 1116 by Aretta Nip, RN Outcome: Progressing 07/30/2023 1115 by Aretta Nip, RN Outcome: Progressing   Problem: Safety: Goal: Verbalization of understanding the information provided will improve 07/30/2023 1116 by Aretta Nip, RN Outcome: Not Progressing Note: Recent fall 07/30/2023 1115 by Aretta Nip, RN Outcome: Progressing   Problem: Self-Concept: Goal: Level of anxiety will decrease 07/30/2023 1116 by Aretta Nip, RN Outcome: Progressing 07/30/2023 1115 by Aretta Nip, RN Outcome: Progressing Goal: Ability to verbalize feelings about condition will improve 07/30/2023 1116 by Aretta Nip, RN Outcome: Progressing 07/30/2023 1115 by Aretta Nip, RN Outcome: Progressing

## 2023-07-30 NOTE — Progress Notes (Deleted)
Interval history No acute events overnight. Patient had no altered mental status or acute changes. Met patient bedside this morning, her daughter was present for the exam. Patient said that she had minimal appetite this morning, but no nausea/vomiting. She reported some dizziness with PT session this morning and voiced that she had had some dizziness over the last few days with activity increases.   Nursing collected orthostatic BP values which reinforced the impression that the team wanted to hold her BP medications at the time.   Physical exam Blood pressure 101/70, pulse 87, temperature (!) 97.5 F (36.4 C), temperature source Oral, resp. rate 18, height 5\' 3"  (1.6 m), weight 54.8 kg, SpO2 91%.  No distress Heart rate is normal and regular, radial pulses strong Breathing comfortably on room air Skin is warm and dry Alert, answering simple questions and following simple instructions.  Physical Exam Vitals and nursing note reviewed.  Constitutional:      Appearance: She is ill-appearing.  HENT:     Head: Normocephalic.      Mouth/Throat:     Pharynx: Oropharynx is clear.  Eyes:     Extraocular Movements: Extraocular movements intact.     Pupils: Pupils are equal, round, and reactive to light.  Cardiovascular:     Rate and Rhythm: Normal rate and regular rhythm.     Pulses: Normal pulses.  Pulmonary:     Effort: Pulmonary effort is normal.     Breath sounds: Normal breath sounds.  Abdominal:     General: Abdomen is flat. Bowel sounds are normal.     Palpations: Abdomen is soft.  Musculoskeletal:     Right upper arm: Normal.     Comments: Left UE weakness, 2/5. Significant drift. BLE weakness, 2/5.   Skin:    General: Skin is warm and dry.  Neurological:     Mental Status: She is disoriented.     Cranial Nerves: No cranial nerve deficit.     Sensory: No sensory deficit.  Psychiatric:        Attention and Perception: Attention normal.        Mood and  Affect: Mood is depressed.        Speech: Speech is delayed.        Behavior: Behavior normal. Behavior is cooperative.        Cognition and Memory: Memory normal. Cognition is impaired.        Judgment: Judgment normal.    CT Head w/o Contrast: 11/3 Soft tissue contusion at the left frontal scalp. No calvarial fracture.   Assessment and plan Hospital day 5  Tabitha Ramirez is a 77 y.o. 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 Pt's insurer requested a P2P on 11/4 at 12 pm. This was completed by Dr Modena Slater. The decision was made to transfer the patient over to [facility] where her oncology team  can offer her the palliative radiation treatments while she is awaiting a SNF.  Principal Problem:   Seizure (HCC) Active Problems:   Essential hypertension, benign   Anemia   HLD (hyperlipidemia)   Malignant neoplasm of female breast (HCC)   Cancer of right breast metastatic to brain [facility])   Hypothyroidism   Malignant neoplasm of right  breast metastatic to brain [facility])  Epilepsy with breakthrough seizure Stable, no more seizures since admission.  Adherent to levetiracetam evidenced by serum drug levels.  Lacosamide levels pending.  Outpatient levetiracetam continued, lacosamide was increased per below.  Need to follow-up regarding duration of dexamethasone. 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  Next treatment on Monday.  Hopeful that she can be discharged to treatment and then the [facility] to limit medical  facility transfers. - Rivaroxaban for VTE prophylaxis - Anastrozole 1 mg daily  Hypertension Pt experiencing dizziness with exertion and having soft blood pressures.  - Holding/Discontinuing home triamterene/ hydrochlorothiazide.  Hyperlipidemia Outpatient statin discontinued  Hypothyroid Outpatient Synthroid continued  Iron deficiency anemia Discontinue iron sulfate.  Supportive care As needed Zofran for nausea and outpatient Percocet for pain.  Diet: Regular IVF: N/A VTE: rivaroxaban (XARELTO) tablet 10 mg Start: 07/25/23 1000  Code: DNR PT/OT recommendations: SNF for subacute rehab TOC recommendations: Following Family Update: N/A  Discharge plan: Monday, pending insurance authorization for SNF.  Signed: Christie Nottingham, MD [facility] Health Physician, PGY-1 07/30/2023 12:12 PM

## 2023-07-30 NOTE — Discharge Summary (Addendum)
Name: Tabitha Ramirez MRN: [id number] DOB: [birth year] 77 y.o. PCP: [facility]  Date of Admission: 07/24/2023  1:10 PM Date of Discharge: 07/30/23 Attending Physician: Dickie La, MD  Discharge Diagnosis: 1. 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 She was admitted to the IM teaching service. Her anti-seizure medications were adjusted and she will be discharging to a skilled nursing facility. Based on a discussion of her goals of care, the inpatient team felt that it was appropriate to discontinue several of her long-running preventative medications.  Discharge Medications: Allergies as of 07/30/2023       Reactions   Penicillins Rash   Face swelling   Nsaids Nausea And Vomiting, Other (See Comments)   Pt states stomach ulcers, vomiting, and bleeding.        Medication List     STOP taking these medications    ferrous sulfate 325 (65 FE) MG tablet   LORazepam 0.5 MG tablet Commonly known as: ATIVAN   rosuvastatin 10 MG tablet Commonly known as: CRESTOR   triamterene-hydrochlorothiazide 37.5-25 MG tablet Commonly known as: MAXZIDE-25   Vitamin D3 10 MCG (400 UNIT) Chew       TAKE these medications    acetaminophen 325 MG tablet Commonly known as: TYLENOL Take 1-2 tablets (325-650 mg total) by mouth every 6 (six) hours as needed for mild pain (pain score 1-3). What changed:  how much to take reasons to take this    anastrozole 1 MG tablet Commonly known as: ARIMIDEX Take 1 tablet (1 mg total) by mouth daily. TAKE 1 TABLET(1 MG) BY MOUTH DAILY Strength: 1 mg What changed: See the new instructions.   dexamethasone 4 MG tablet Commonly known as: DECADRON Take 1 tablet (4 mg total) by mouth daily. Follow up with Dr. Barbaraann Cao to get refills.   feeding supplement Liqd Take 237 mLs by mouth 2 (two) times daily between meals.   Lacosamide 150 MG Tabs Take 1 tablet (150 mg total) by mouth 2 (two) times daily. What changed:  medication strength how much to take   levETIRAcetam 1000 MG tablet Commonly known as: KEPPRA Take 2 tablets (2,000 mg total) by mouth 2 (two) times daily.   levothyroxine 100 MCG tablet Commonly known as: SYNTHROID Take 1 tablet (100 mcg total) by mouth daily before breakfast.   Nayzilam 5 MG/0.1ML Soln Generic drug: Midazolam Place 5 mg into the nose as needed (seizure lasting over 2 minutes. Can repeat after 10 minutes in opposite nostril if still seizing).   ondansetron 4 MG disintegrating tablet Commonly known as: ZOFRAN-ODT Take 1 tablet (4 mg total) by mouth every 6 (six) hours as needed.   oxyCODONE-acetaminophen 5-325 MG tablet Commonly known as: PERCOCET/ROXICET Take 1 tablet by mouth 4 (four) times daily as needed.   potassium chloride 10 MEQ tablet Commonly known as: KLOR-CON Take 1 tablet (10 mEq total) by mouth daily.   rivaroxaban 10 MG Tabs tablet Commonly known as: XARELTO Take  1 tablet (10 mg total) by mouth daily.   vitamin B-12 500 MCG tablet Commonly known as: CYANOCOBALAMIN Take 1 tablet (500 mcg total) by mouth daily.        Disposition and follow-up:   Tabitha Ramirez was discharged from [facility] in [place] condition.  At the hospital follow up visit please address:  1.  Discontinuation of long-term medications - while in the hospital, we discontinued her cholesterol medications, her iron supplement, and her  antihypertensives. If she becomes symptomatic and it is considered appropriate, any of these medications could be restarted.  2. PCP Recs:  A) Breast cancer - 2 more palliative radiation tx, keep xarelto 10mg , anastrazole 1 mg daily.  B) Seizures - Increased vimpat to 150, continuing keppra, dexamethasone, added on new midazolam as rescue med. Consider lacosamide/ keppra levels if appropriate  C) Chronic Pain / Metastasis - continue PO oxycodone/acetaminophen 5/325 Q6 PRN  3.  Labs / imaging needed at time of follow-up: please obtain a potassium level within the next one week, as patient may not require daily supplementation with cessation of triamterene-hctz  4.  Pending labs/ test needing follow-up: None  Follow-up Appointments:  Contact information for after-discharge care     Destination     [facility] Preferred SNF .   Service: Skilled Nursing Contact information: [address] 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   While at the oncologist office on 10/29, she had a cluster of 4 focal seizures characterized by left-sided posturing left gaze deviation and impaired awareness.  Multiple seizures were treated with 2 mg IM Ativan at the clinic. She was transferred to [facility] ED CT head w/o acute process, right parietal resection cavity and extra axial metastatic disease along the right parietal region consistent with MRI obtained on 07/17/23. She was transferred to [facility] for Neurology workup for seizures. Neurology consulted and increased home vimpat to 150 mg BID and continue home keppra at 2000 mg BID. Pt has poor prognosis and will be hard to control the seizures per neuro. EEG w/ moderate diffuse encephalopathy.   Patient oriented x3  this morning, still soft spoken. Patient previously at [facility] rehab facility in October 2024 after recurrent seizures and transitioned to live with family.   - Continue Keppra at 2,000 mg BID  - Continue Increased Vimpat to 150 mg BID  - At d/c intranasal Nayzilam 5 mg or valtoco 15 mg for seizure lasting more than 2 minutes, daughter prefers Nayzilam  - Continue Dexamethasone 4 mg daily  # Metastatic breast cancer.  Invasive ductal carcinoma grade 3 diagnosed in 01/2013.  Status post neoadjuvant chemotherapy, lumpectomy and radiation therapy in 2014-2015. On 06/23, patient had strokelike symptoms; left facial droop, left leg drift and decreased sensation apraxia. MRI at that time showed irregular enhancing mass in the right parietal lobe. She underwent craniotomy with tumor resection, with pathology showing metastatic carcinoma compatible with patient's known breast cancer. She is currently getting whole brain radiation therapy. Patient's oncologist, Dr. Barbaraann Cao also recommended Xarelto 10 mg for anticoagulation.  - Two more palliative radiation treatments - 11/5, 11/6 - SNF to coordinate logistics. - Continue Anastrozole 1 mg. -  Continue Xarelto 10 mg.   Anemia of chronic disease. Pt with history and admission 11.1 hgb. Stable, no overt signs of bleeding  - Discontinue home ferrous sulfate daily    Chronic stable medical conditions   # Hypertension -BP 96-134 past 24 hours, mild hypertension controlled. Home medication is Triamterene-HCTZ 37.5-25 mg - Patient had soft blood pressures during the 48H prior to discharge. Orthostatic VS negative. Discontinued Triamterene- hydrochlorothiazide, can restart later if appropriate.  # Hyperlipidemia Patient with past history, home medication of Crestor 10 mg daily   - Discontinuing statin    # Hypothyroidism Patient with history and home medication of synthroid 100 mcg. - Continue home med    # Hypokalemia Patient 3.4 on admission. Takes  potassium supplementation at home. No labs today.  - Continue Kclor   # Nausea -Continue Zofran 4 mg prn    # Chronic pain Continue home Percocet 5-325 mg PRN   Discharge Exam:   BP 101/70 (BP Location: Left Arm)   Pulse 87   Temp (!) 97.5 F (36.4 C) (Oral)   Resp 18   Ht 5\' 3"  (1.6 m)   Wt 54.8 kg   SpO2 91%   BMI 21.40 kg/m    Physical Exam Vitals and nursing note reviewed.  Constitutional:      Appearance: She is ill-appearing.  HENT:     Head: Normocephalic.     Mouth/Throat: Mucous membranes are moist.  Eyes:     Pupils: Pupils are equal, round, and reactive to light.  Cardiovascular:     Rate and Rhythm: Bradycardia present.  Pulmonary:     Effort: Pulmonary effort is normal.     Breath sounds: Normal breath sounds.  Abdominal:     General: There is no distension.     Palpations: Abdomen is soft.     Tenderness: There is no abdominal tenderness.  Skin:    General: Skin is warm and dry.  Neurological:     General: No focal deficit present. There was some concern from physical therapy about her having trace nystagmus, but her exam, including EOMI, were normal on my exam.    Mental Status: She is alert and oriented to person, place, and time.  Psychiatric:        Attention and Perception: Attention normal.        Mood and Affect: Mood is depressed.        Behavior: Behavior is slowed and withdrawn. Behavior is cooperative.        Thought Content: Thought content normal.        Cognition and Memory: Cognition normal.        Judgment: Judgment normal.   Pertinent Labs, Studies, and Procedures:   Discharge Instructions: Patient is discharging to [facility], with follow-up with her specialist care teams for oncology and palliative care. If she is having seizures, her rescue medication will be the nayzilam intranasal.  The patient, the palliative care follow-up, and her family should make a shared discussion about when and whether to bring her back to  the hospital.   Signed: Margaretmary Dys, MD 07/30/2023, 11:27 AM   Pager: [phone]

## 2023-07-30 NOTE — Progress Notes (Signed)
Discussed case with Dr. Robb Matar of [facility] hospitalist service.  Given patient requires constant back-and-forth for radiation therapy, requested patient to be transferred to [facility] long for further management given patient requires constant need to travel back and forth.  Requested transfer, and patient was accepted for transfer.  Update TOC team who will update [place]'s TOC team.  Orders have been placed and patient has been accepted.  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  Patient is followed by neurology, who has increased patient's Vimpat.  Patient has been seizure-free and has been medically stable.  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  Patient is also getting palliative whole brain radiation therapy.  Patient to continue with radiation whole brain therapy.  Therapy today.  Will need 2 more treatments as well.  #Hypertension Patient was slightly hypotensive today. Patient had orthostatics which were negative.  Will hold home triamterene-hydrochlorothiazide.  If patient becomes hypertensive can resume.  #Hyperlipidemia Outpatient statin discontinued   #Hypothyroid Outpatient Synthroid continued   #Iron deficiency anemia Discontinue iron sulfate.   #Supportive care As needed Zofran for nausea and Percocet for pain  Daughter updated regarding plan. Diet: Regular IVF: N/A VTE: rivaroxaban (XARELTO) tablet 10 mg  Start: 07/25/23 1000  Code: DNR PT/OT recommendations: SNF for subacute rehab TOC recommendations: Following Family Update: N/A

## 2023-07-31 ENCOUNTER — Ambulatory Visit
Admission: RE | Admit: 2023-07-31 | Discharge: 2023-07-31 | Disposition: A | Discharge status: Home / Self Care | Payer: Medicare PPO | Source: Ambulatory Visit | Attending: Radiation Oncology

## 2023-07-31 ENCOUNTER — Other Ambulatory Visit (HOSPITAL_COMMUNITY): Payer: Self-pay

## 2023-07-31 ENCOUNTER — Telehealth: Payer: Self-pay

## 2023-07-31 ENCOUNTER — Other Ambulatory Visit: Payer: Self-pay

## 2023-07-31 ENCOUNTER — Ambulatory Visit: Payer: Medicare PPO

## 2023-07-31 DIAGNOSIS — C50812 Malignant neoplasm of overlapping sites of left female breast: Secondary | ICD-10-CM | POA: Diagnosis present

## 2023-07-31 DIAGNOSIS — Z510 Encounter for antineoplastic radiation therapy: Secondary | ICD-10-CM | POA: Diagnosis present

## 2023-07-31 DIAGNOSIS — C7931 Secondary malignant neoplasm of brain: Secondary | ICD-10-CM | POA: Diagnosis present

## 2023-07-31 LAB — RAD ONC ARIA SESSION SUMMARY
Course Elapsed Days: 27
Plan Fractions Treated to Date: 4
Plan Prescribed Dose Per Fraction: 3 Gy
Plan Total Fractions Prescribed: 5
Plan Total Prescribed Dose: 15 Gy
Reference Point Dosage Given to Date: 12 Gy
Reference Point Session Dosage Given: 3 Gy
Session Number: 14

## 2023-07-31 NOTE — Telephone Encounter (Signed)
RN called [facility] to follow up on transportation to radiation treatment appointments.  Spoke with Presenter, broadcasting and she reports they have Ms. Aye rides set up for today 07/31/2023 and tomorrow 08/01/2023 which is final day of radiation treatment.  Coordinator said that if there are any future appointment to send schedule back with patient.  RN informed her that 08/01/2023 will be patient last day of treatment but if anything changes or we need to add we will send a schedule.  Radiation therapists were informed of call.

## 2023-08-01 ENCOUNTER — Ambulatory Visit
Admission: RE | Admit: 2023-08-01 | Discharge: 2023-08-01 | Disposition: A | Discharge status: Home / Self Care | Payer: Medicare PPO | Source: Ambulatory Visit | Attending: Radiation Oncology | Admitting: Radiation Oncology

## 2023-08-01 ENCOUNTER — Other Ambulatory Visit: Payer: Self-pay

## 2023-08-01 DIAGNOSIS — C7931 Secondary malignant neoplasm of brain: Secondary | ICD-10-CM

## 2023-08-01 DIAGNOSIS — Z510 Encounter for antineoplastic radiation therapy: Secondary | ICD-10-CM | POA: Diagnosis not present

## 2023-08-01 LAB — RAD ONC ARIA SESSION SUMMARY
Course Elapsed Days: 28
Plan Fractions Treated to Date: 5
Plan Prescribed Dose Per Fraction: 3 Gy
Plan Total Fractions Prescribed: 5
Plan Total Prescribed Dose: 15 Gy
Reference Point Dosage Given to Date: 15 Gy
Reference Point Session Dosage Given: 3 Gy
Session Number: 15

## 2023-08-02 NOTE — Radiation Completion Notes (Addendum)
  Radiation Oncology         [phone] ________________________________  Name: Tabitha Ramirez MRN: [id number]  Date: 08/01/2023  DOB: 03/20/1946  Referring Physician: MADIE RIPPLE, M.D. Date of Service: 2023-08-02 Radiation Oncologist: Adina Barge, M.D. 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     Intent: Palliative     ==========DELIVERED PLANS==========  First Treatment Date: 2023-07-04 - Last Treatment Date: 2023-08-01   Plan Name: Brain Site: Brain Technique: Isodose Plan Mode: Photon Dose Per Fraction: 2.5 Gy Prescribed Dose (Delivered / Prescribed): 25 Gy / 25 Gy Prescribed Fxs (Delivered / Prescribed): 10 / 10   Plan Name: Brain_Bst Site: Brain Technique: 3D Mode: Photon Dose Per Fraction: 3 Gy Prescribed Dose (Delivered / Prescribed): 15 Gy / 15 Gy Prescribed Fxs (Delivered / Prescribed): 5 / 5     ==========ON TREATMENT VISIT DATES========== 2023-07-10, 2023-07-12, 2023-07-27   See weekly On Treatment Notes in Epic for details.   The patient will receive a call in about one month from the radiation oncology department. She will continue follow up with Dr. Buckley as well.  ------------------------------------------------   Donnice Barge, MD [facility]  Radiation Oncology Direct Dial: [phone]  Fax: [phone] [email or web address]  Skype  LinkedIn

## 2023-08-06 ENCOUNTER — Inpatient Hospital Stay: Payer: Medicare PPO | Attending: Hematology and Oncology

## 2023-08-06 NOTE — Progress Notes (Signed)
Nutrition  Called patient for scheduled nutrition phone visit.  No answer.  Left message with call back number.    Mahesh Sizemore B. Zenia Resides, [place], [place] Registered Dietitian [phone]

## 2023-08-10 ENCOUNTER — Telehealth (HOSPITAL_BASED_OUTPATIENT_CLINIC_OR_DEPARTMENT_OTHER): Payer: Medicare PPO | Admitting: Nurse Practitioner

## 2023-08-10 ENCOUNTER — Encounter: Payer: Self-pay | Admitting: Nurse Practitioner

## 2023-08-10 DIAGNOSIS — Z515 Encounter for palliative care: Secondary | ICD-10-CM | POA: Diagnosis not present

## 2023-08-10 DIAGNOSIS — Z7189 Other specified counseling: Secondary | ICD-10-CM

## 2023-08-10 NOTE — Telephone Encounter (Signed)
I connected with Tabitha Ramirez on 08/10/2023 at 1030 by phone and verified that I am speaking with the correct person using two identifiers.   I discussed the limitations, risks, security and privacy concerns of performing an evaluation and management service by telemedicine and the availability of in-person appointments. I also discussed with the patient that there may be a patient responsible charge related to this service. The patient expressed understanding and agreed to proceed.   Other persons participating in the visit and their role in the encounter: Tabitha Ramirez Hylton-Smith   Patient's location: [facility]  Provider's location: [facility]   Chief Complaint: Medical Advice/Goals of Care  I connected by phone with patient's daugher/HCPOA Tabitha Ramirez who contacted office for goals of care discussions. Tabitha Ramirez shares her mother is currently at [facility] facility s/p recent hospitalization. Unfortunately patient continues to decline and is showing no signs of improvement. Daughter states she does not wish for her mother to suffer and desires to focus on her comfort for what time she has left. Tabitha Ramirez reports nursing staff at facility is recommending hospice. They have placed orders for hospice to initiate family discussions. Tabitha Ramirez is asking appropriate questions regarding hospice care and expectations.   Education provided on hospice's goals and philosophy of care including what care would look like in the setting of comfort focused care in the home, facility, or inpatient hospice unit. We discussed criteria for inpatient hospice versus outpatient hospice. Daughter states patient is scheduled to discharge home on Sunday however family is in the process of appealing as they make final decisions. They are realistic in their understanding and want what is best for Tabitha Ramirez. Tabitha Ramirez plans to discuss hospice referral further with Social Work staff at [facility] as they plan for most effective disposition  for patient in the upcoming days.   Daughter also inquiring regarding minimizing medications. I reviewed  current medications at time of discharge discussing medications most appropriate to continue focusing on symptom management.   All questions answered and support provided. Tabitha Ramirez knows to contact office as needed.    Visit consisted of counseling and education dealing with the complex and emotionally intense issues of symptom management and palliative care in the setting of serious and potentially life-threatening illness.  Willette Alma, AGPCNP-BC  Palliative Medicine Team/[facility]

## 2023-09-04 ENCOUNTER — Ambulatory Visit
Admission: RE | Admit: 2023-09-04 | Discharge: 2023-09-04 | Disposition: A | Discharge status: Home / Self Care | Payer: Medicare PPO | Source: Ambulatory Visit | Attending: Radiation Oncology | Admitting: Radiation Oncology

## 2023-09-04 NOTE — Progress Notes (Signed)
  Radiation Oncology         [phone] ________________________________  Name: Tabitha Ramirez MRN: [id number]  Date of Service: 09/04/2023  DOB: Apr 28, 1946  Post Treatment Telephone Note  Diagnosis:  C79.31 Secondary malignant neoplasm of brain (as documented in provider EOT note)  The patient was not available for call today. Voicemail left.  The patient was counseled that she will be contacted by our brain and spine navigator to schedule surveillance imaging. The patient was encouraged to call if she have not received a call to schedule imaging, or if she develops concerns or questions regarding radiation. The patient will also continue to follow up with Dr. Duard Larsen.   Ruel Favors, LPN

## 2023-10-27 DEATH — deceased

## 2023-12-04 ENCOUNTER — Other Ambulatory Visit (HOSPITAL_COMMUNITY): Payer: Self-pay

## 2024-05-25 IMAGING — CT CT HEAD CODE STROKE
4 series · 15 of 47 positions shown, 17 images · non-contrast
Comparison: No pertinent prior exams available for comparison.

CLINICAL DATA: Code stroke. Mental status change, unknown cause.
Left-sided weakness. Headache.



[Series 3: head wo · axial · 0.41mm/px · z∈[+233,+348]mm · 7 of 31 slices shown, 9 images]
[im 4/31  brain]
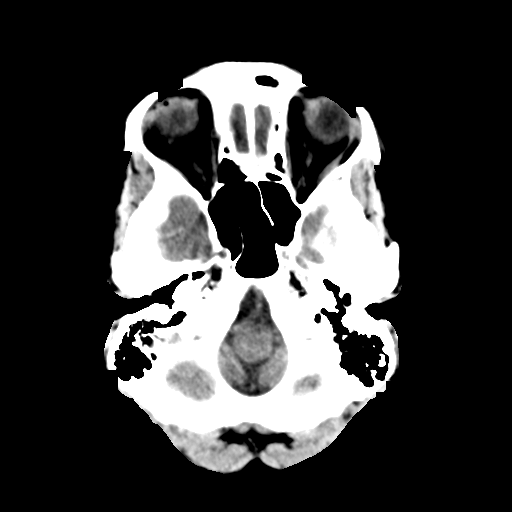
[im 4/31  bone]
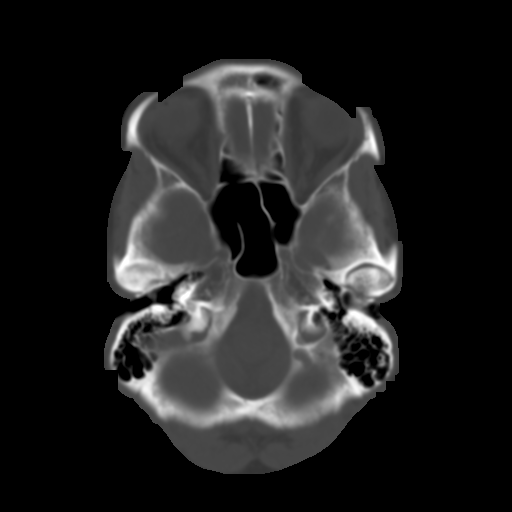
[im 8/31  brain]
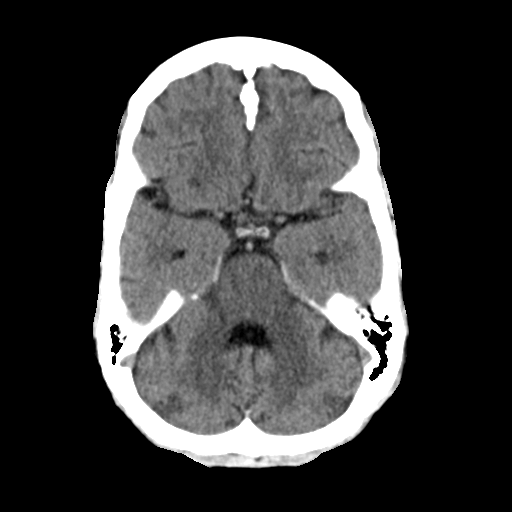
[im 12/31  brain]
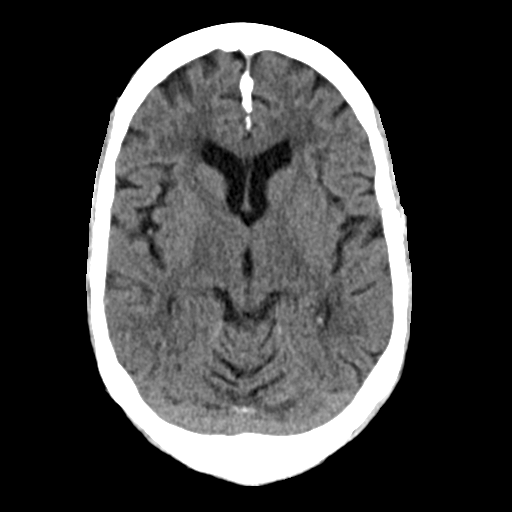
[im 16/31  brain]
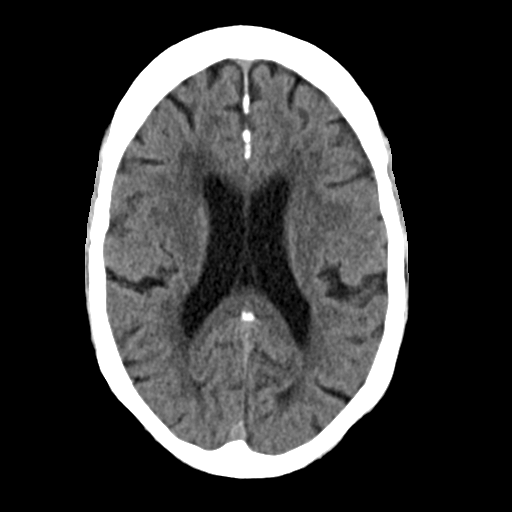
[im 19/31  brain]
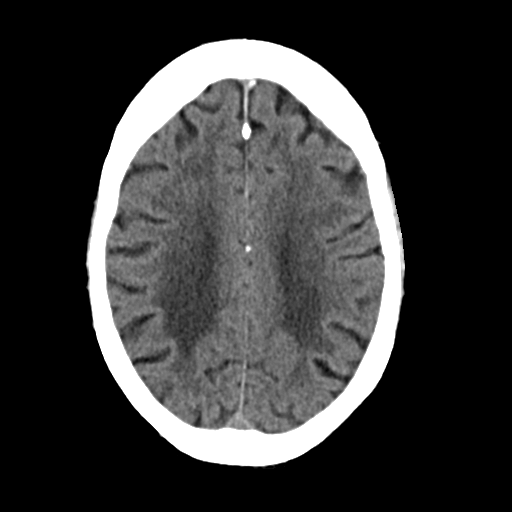
[im 19/31  bone]
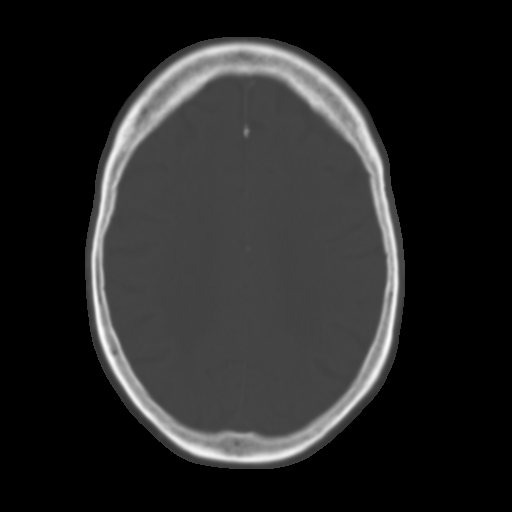
[im 23/31  brain]
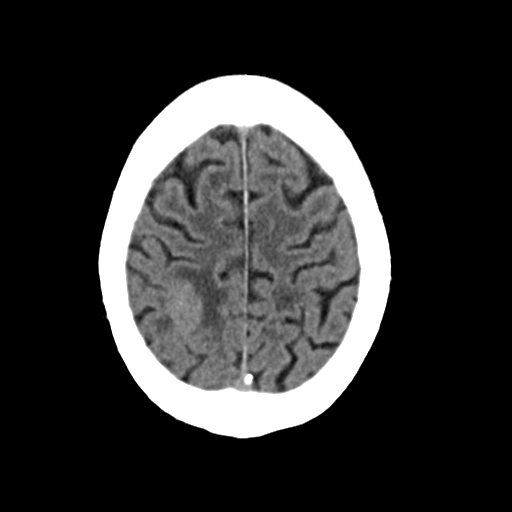
[im 27/31  brain]
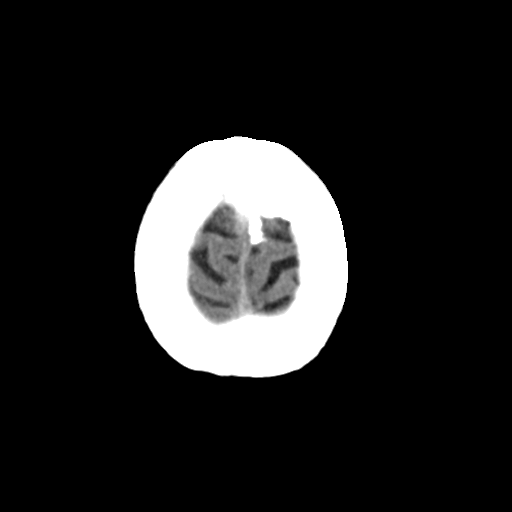

[Series 4: head bone · axial · 0.41mm/px · z∈[+232,+248]mm · 2 of 76 slices shown]
[im 8/76  bone]
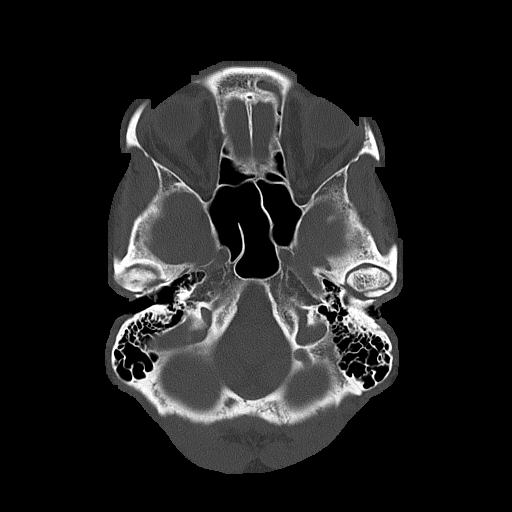
[im 16/76  bone]
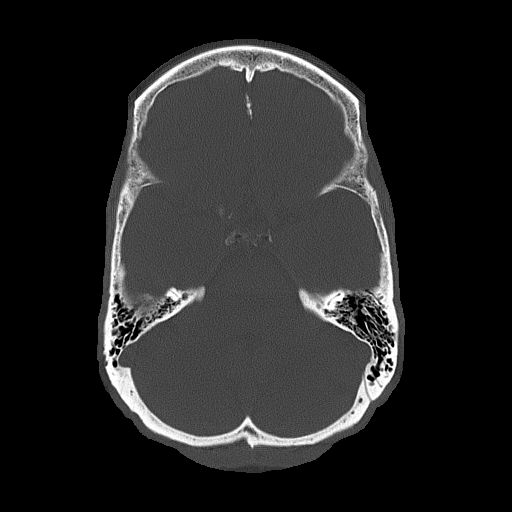

[Series 5: coronal soft tissue · coronal · 0.31mm/px · 3 of 65 slices shown]
[im 22/65  brain]
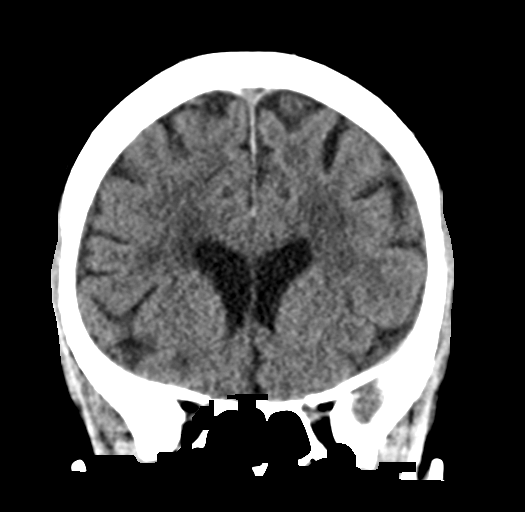
[im 29/65  brain]
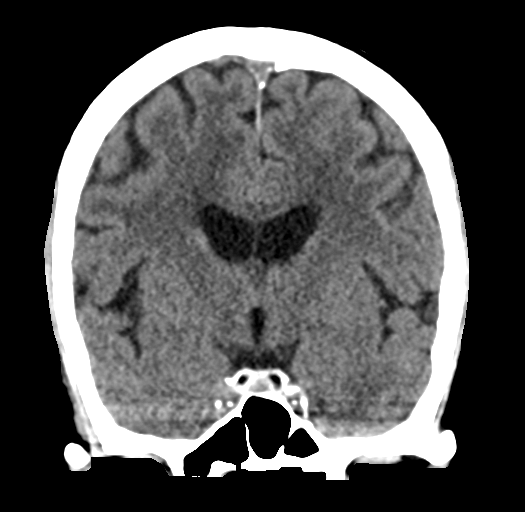
[im 36/65  brain]
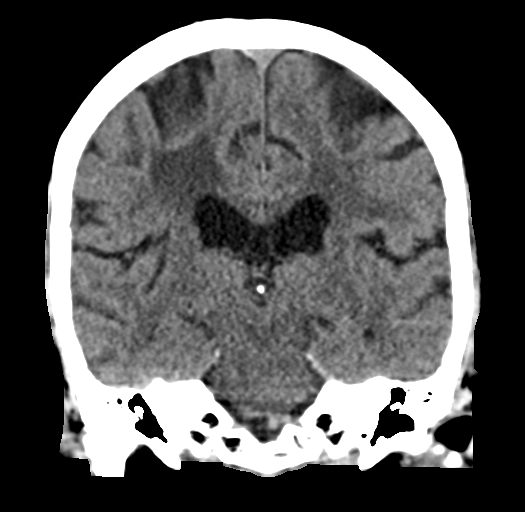

[Series 6: sagittal soft tissue · sagittal · 0.31mm/px · 3 of 50 slices shown]
[im 17/50  brain]
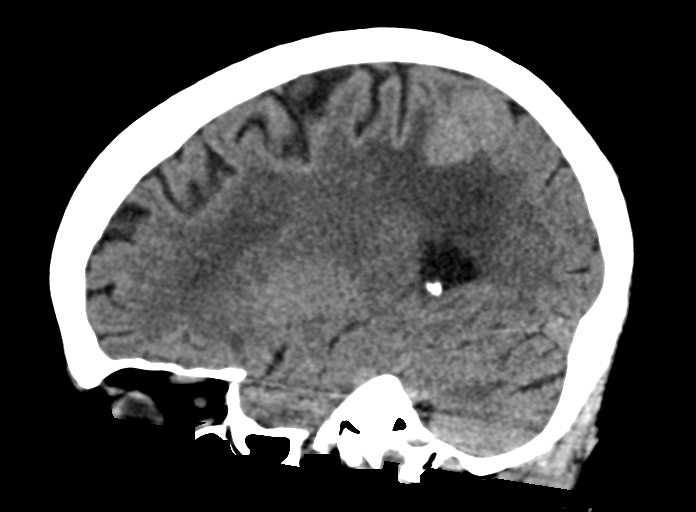
[im 25/50  brain]
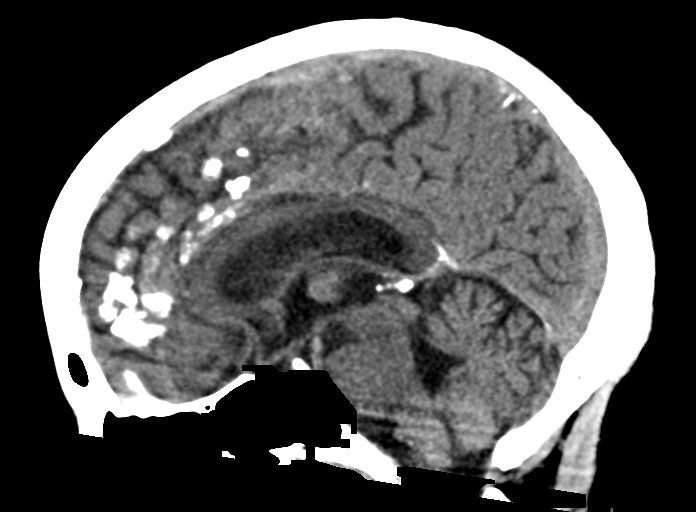
[im 33/50  brain]
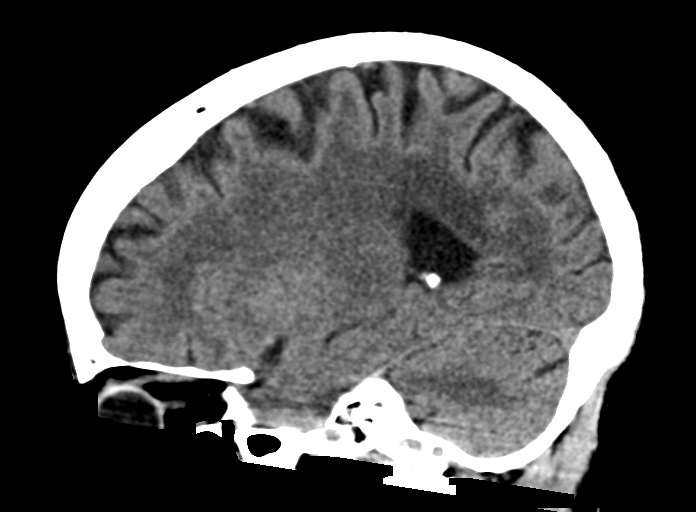

[15 of 47 positions shown; findings below may reference images not displayed]

FINDINGS: Brain:

Mild generalized parenchymal atrophy.

2.3 x 1.6 cm hyperdense mass within the right parietal lobe with
mild surrounding vasogenic edema (for instance as seen on series 5,
image 43).

Background moderate patchy and ill-defined hypoattenuation within
the cerebral white matter, nonspecific but compatible with chronic
small vessel ischemic disease.

There is no acute intracranial hemorrhage.

No demarcated cortical infarct.

No extra-axial fluid collection.

No midline shift.

Vascular: No hyperdense vessel. Atherosclerotic calcifications.

Skull: No fracture or aggressive osseous lesion.

Sinuses/Orbits: No mass or acute finding within the imaged orbits.
Fluid within the inferior left frontal sinus. Minimal mucosal
thickening within the bilateral ethmoid and sphenoid sinuses at the
imaged levels.

Other: Bilateral TMJ osteoarthrosis.

ASPECTS (Alberta Stroke Program Early CT Score)

- Ganglionic level infarction (caudate, lentiform nuclei, internal
capsule, insula, M1-M3 cortex): 7

- Supraganglionic infarction (M4-M6 cortex): 3

Total score (0-10 with 10 being normal): 10

These results were called by telephone at the time of interpretation
on 03/09/2022 at [DATE] to provider MISSTR HID , who verbally
acknowledged these results.
IMPRESSION: 2.3 x 1.6 cm hyperdense mass within the right parietal lobe with
mild surrounding vasogenic edema. This is most suspicious for an
intracranial metastasis. A brain MRI without and with contrast is
recommended for further evaluation.

No evidence of acute infarct or acute intracranial hemorrhage.

Moderate chronic small vessel ischemic changes within the cerebral
white matter.

Mild generalized parenchymal atrophy.

Paranasal sinus disease, as described.
